# Patient Record
Sex: Male | Born: 1955 | Race: White | Hispanic: No | Marital: Married | Smoking: Former smoker
Health system: Southern US, Community
[De-identification: ages and names within clinical notes are randomized; demographics above are authoritative.]

## PROBLEM LIST (undated history)

## (undated) DIAGNOSIS — N051 Unspecified nephritic syndrome with focal and segmental glomerular lesions: Secondary | ICD-10-CM

## (undated) DIAGNOSIS — I10 Essential (primary) hypertension: Secondary | ICD-10-CM

## (undated) DIAGNOSIS — N185 Chronic kidney disease, stage 5: Secondary | ICD-10-CM

## (undated) DIAGNOSIS — I2510 Atherosclerotic heart disease of native coronary artery without angina pectoris: Secondary | ICD-10-CM

## (undated) DIAGNOSIS — I639 Cerebral infarction, unspecified: Secondary | ICD-10-CM

## (undated) DIAGNOSIS — I129 Hypertensive chronic kidney disease with stage 1 through stage 4 chronic kidney disease, or unspecified chronic kidney disease: Secondary | ICD-10-CM

## (undated) DIAGNOSIS — F32A Depression, unspecified: Secondary | ICD-10-CM

## (undated) DIAGNOSIS — I509 Heart failure, unspecified: Secondary | ICD-10-CM

## (undated) DIAGNOSIS — I255 Ischemic cardiomyopathy: Secondary | ICD-10-CM

## (undated) DIAGNOSIS — I5189 Other ill-defined heart diseases: Secondary | ICD-10-CM

## (undated) DIAGNOSIS — D7581 Myelofibrosis: Secondary | ICD-10-CM

## (undated) DIAGNOSIS — F17200 Nicotine dependence, unspecified, uncomplicated: Secondary | ICD-10-CM

## (undated) DIAGNOSIS — R0600 Dyspnea, unspecified: Secondary | ICD-10-CM

## (undated) DIAGNOSIS — D649 Anemia, unspecified: Secondary | ICD-10-CM

## (undated) DIAGNOSIS — R0609 Other forms of dyspnea: Secondary | ICD-10-CM

## (undated) DIAGNOSIS — D696 Thrombocytopenia, unspecified: Secondary | ICD-10-CM

## (undated) DIAGNOSIS — F329 Major depressive disorder, single episode, unspecified: Secondary | ICD-10-CM

## (undated) DIAGNOSIS — I471 Supraventricular tachycardia, unspecified: Secondary | ICD-10-CM

## (undated) DIAGNOSIS — E785 Hyperlipidemia, unspecified: Secondary | ICD-10-CM

## (undated) DIAGNOSIS — N184 Chronic kidney disease, stage 4 (severe): Secondary | ICD-10-CM

## (undated) DIAGNOSIS — N179 Acute kidney failure, unspecified: Secondary | ICD-10-CM

## (undated) DIAGNOSIS — C801 Malignant (primary) neoplasm, unspecified: Secondary | ICD-10-CM

## (undated) DIAGNOSIS — R5383 Other fatigue: Secondary | ICD-10-CM

## (undated) DIAGNOSIS — I5022 Chronic systolic (congestive) heart failure: Secondary | ICD-10-CM

## (undated) DIAGNOSIS — J449 Chronic obstructive pulmonary disease, unspecified: Secondary | ICD-10-CM

## (undated) DIAGNOSIS — T8859XA Other complications of anesthesia, initial encounter: Secondary | ICD-10-CM

## (undated) DIAGNOSIS — I779 Disorder of arteries and arterioles, unspecified: Secondary | ICD-10-CM

## (undated) HISTORY — PX: EYE SURGERY: SHX253

## (undated) HISTORY — DX: Myelofibrosis: D75.81

## (undated) HISTORY — DX: Depression, unspecified: F32.A

## (undated) HISTORY — DX: Atherosclerotic heart disease of native coronary artery without angina pectoris: I25.10

## (undated) HISTORY — DX: Other ill-defined heart diseases: I51.89

## (undated) HISTORY — DX: Ischemic cardiomyopathy: I25.5

## (undated) HISTORY — DX: Hypertensive chronic kidney disease with stage 1 through stage 4 chronic kidney disease, or unspecified chronic kidney disease: I12.9

## (undated) HISTORY — DX: Cerebral infarction, unspecified: I63.9

## (undated) HISTORY — DX: Essential (primary) hypertension: I10

## (undated) HISTORY — DX: Dyspnea, unspecified: R06.00

## (undated) HISTORY — PX: CORONARY ARTERY BYPASS GRAFT: SHX141

## (undated) HISTORY — DX: Anemia, unspecified: D64.9

## (undated) HISTORY — DX: Hyperlipidemia, unspecified: E78.5

## (undated) HISTORY — PX: CARDIAC CATHETERIZATION: SHX172

## (undated) HISTORY — DX: Chronic systolic (congestive) heart failure: I50.22

## (undated) HISTORY — DX: Unspecified nephritic syndrome with focal and segmental glomerular lesions: N05.1

## (undated) HISTORY — DX: Heart failure, unspecified: I50.9

## (undated) HISTORY — DX: Disorder of arteries and arterioles, unspecified: I77.9

## (undated) HISTORY — PX: EXTERIORIZATION OF A CONTINUOUS AMBULATORY PERITONEAL DIALYSIS CATHETER: SHX6382

## (undated) HISTORY — DX: Other forms of dyspnea: R06.09

## (undated) HISTORY — DX: Thrombocytopenia, unspecified: D69.6

## (undated) HISTORY — DX: Supraventricular tachycardia: I47.1

## (undated) HISTORY — DX: Supraventricular tachycardia, unspecified: I47.10

## (undated) HISTORY — DX: Nicotine dependence, unspecified, uncomplicated: F17.200

## (undated) HISTORY — DX: Major depressive disorder, single episode, unspecified: F32.9

## (undated) HISTORY — DX: Chronic kidney disease, stage 4 (severe): N18.4

## (undated) HISTORY — PX: ANGIOPLASTY: SHX39

## (undated) HISTORY — DX: Acute kidney failure, unspecified: N17.9

---

## 1995-09-23 DIAGNOSIS — I219 Acute myocardial infarction, unspecified: Secondary | ICD-10-CM

## 1995-09-23 HISTORY — DX: Acute myocardial infarction, unspecified: I21.9

## 1995-09-23 HISTORY — PX: CARDIAC CATHETERIZATION: SHX172

## 1996-08-22 DIAGNOSIS — I2510 Atherosclerotic heart disease of native coronary artery without angina pectoris: Secondary | ICD-10-CM

## 1996-08-22 HISTORY — PX: CORONARY STENT PLACEMENT: SHX1402

## 1996-08-22 HISTORY — DX: Atherosclerotic heart disease of native coronary artery without angina pectoris: I25.10

## 2002-10-23 DIAGNOSIS — E785 Hyperlipidemia, unspecified: Secondary | ICD-10-CM

## 2002-10-23 HISTORY — DX: Hyperlipidemia, unspecified: E78.5

## 2008-05-15 ENCOUNTER — Ambulatory Visit: Payer: Self-pay | Admitting: Gastroenterology

## 2008-05-23 LAB — HM COLONOSCOPY: HM COLON: NORMAL

## 2008-09-22 DIAGNOSIS — D631 Anemia in chronic kidney disease: Secondary | ICD-10-CM | POA: Diagnosis present

## 2008-09-22 DIAGNOSIS — I10 Essential (primary) hypertension: Secondary | ICD-10-CM | POA: Diagnosis present

## 2008-09-22 DIAGNOSIS — D7581 Myelofibrosis: Secondary | ICD-10-CM

## 2008-09-22 DIAGNOSIS — I5042 Chronic combined systolic (congestive) and diastolic (congestive) heart failure: Secondary | ICD-10-CM | POA: Diagnosis present

## 2008-09-22 HISTORY — DX: Myelofibrosis: D75.81

## 2009-03-22 ENCOUNTER — Ambulatory Visit: Payer: Self-pay | Admitting: Internal Medicine

## 2009-03-22 HISTORY — PX: BONE MARROW ASPIRATION: SHX1252

## 2009-04-06 ENCOUNTER — Ambulatory Visit: Payer: Self-pay | Admitting: Internal Medicine

## 2009-04-22 ENCOUNTER — Ambulatory Visit: Payer: Self-pay | Admitting: Internal Medicine

## 2009-05-25 ENCOUNTER — Ambulatory Visit: Payer: Self-pay | Admitting: Internal Medicine

## 2009-06-22 ENCOUNTER — Ambulatory Visit: Payer: Self-pay | Admitting: Internal Medicine

## 2009-07-20 ENCOUNTER — Ambulatory Visit: Payer: Self-pay | Admitting: Internal Medicine

## 2009-07-23 ENCOUNTER — Ambulatory Visit: Payer: Self-pay | Admitting: Internal Medicine

## 2009-08-22 ENCOUNTER — Ambulatory Visit: Payer: Self-pay | Admitting: Internal Medicine

## 2009-09-12 ENCOUNTER — Ambulatory Visit: Payer: Self-pay | Admitting: Internal Medicine

## 2009-09-22 ENCOUNTER — Ambulatory Visit: Payer: Self-pay | Admitting: Internal Medicine

## 2009-11-09 ENCOUNTER — Ambulatory Visit: Payer: Self-pay | Admitting: Internal Medicine

## 2009-11-20 ENCOUNTER — Ambulatory Visit: Payer: Self-pay | Admitting: Internal Medicine

## 2009-12-21 ENCOUNTER — Ambulatory Visit: Payer: Self-pay | Admitting: Internal Medicine

## 2009-12-22 ENCOUNTER — Ambulatory Visit: Payer: Self-pay | Admitting: Internal Medicine

## 2010-01-20 ENCOUNTER — Ambulatory Visit: Payer: Self-pay | Admitting: Internal Medicine

## 2010-03-29 ENCOUNTER — Ambulatory Visit: Payer: Self-pay | Admitting: Internal Medicine

## 2010-04-22 ENCOUNTER — Ambulatory Visit: Payer: Self-pay | Admitting: Internal Medicine

## 2010-06-21 ENCOUNTER — Ambulatory Visit: Payer: Self-pay | Admitting: Internal Medicine

## 2010-06-22 ENCOUNTER — Ambulatory Visit: Payer: Self-pay | Admitting: Internal Medicine

## 2010-09-12 ENCOUNTER — Ambulatory Visit: Payer: Self-pay | Admitting: Internal Medicine

## 2010-09-22 ENCOUNTER — Ambulatory Visit: Payer: Self-pay | Admitting: Internal Medicine

## 2010-12-06 ENCOUNTER — Ambulatory Visit: Payer: Self-pay | Admitting: Internal Medicine

## 2010-12-22 ENCOUNTER — Ambulatory Visit: Payer: Self-pay | Admitting: Internal Medicine

## 2011-03-11 ENCOUNTER — Ambulatory Visit: Payer: Self-pay | Admitting: Internal Medicine

## 2011-03-23 ENCOUNTER — Ambulatory Visit: Payer: Self-pay | Admitting: Internal Medicine

## 2011-05-12 ENCOUNTER — Ambulatory Visit: Payer: Self-pay | Admitting: Oncology

## 2011-05-24 ENCOUNTER — Ambulatory Visit: Payer: Self-pay | Admitting: Oncology

## 2011-08-15 ENCOUNTER — Ambulatory Visit: Payer: Self-pay | Admitting: Internal Medicine

## 2011-08-23 ENCOUNTER — Ambulatory Visit: Payer: Self-pay | Admitting: Internal Medicine

## 2011-11-07 ENCOUNTER — Ambulatory Visit: Payer: Self-pay | Admitting: Internal Medicine

## 2011-11-07 LAB — CBC CANCER CENTER
Basophil #: 0 x10 3/mm (ref 0.0–0.1)
Basophil %: 0.8 %
Eosinophil #: 0 x10 3/mm (ref 0.0–0.7)
HGB: 12.1 g/dL — ABNORMAL LOW (ref 13.0–18.0)
Lymphocyte %: 24.6 %
MCH: 28.5 pg (ref 26.0–34.0)
MCHC: 33.9 g/dL (ref 32.0–36.0)
MCV: 84 fL (ref 80–100)
Monocyte #: 0.2 x10 3/mm (ref 0.0–0.7)
Neutrophil %: 68 %
RDW: 19.6 % — ABNORMAL HIGH (ref 11.5–14.5)
WBC: 4.2 x10 3/mm (ref 3.8–10.6)

## 2011-11-21 ENCOUNTER — Ambulatory Visit: Payer: Self-pay | Admitting: Internal Medicine

## 2012-01-30 ENCOUNTER — Ambulatory Visit: Payer: Self-pay | Admitting: Internal Medicine

## 2012-01-30 LAB — CBC CANCER CENTER
Basophil #: 0 x10 3/mm (ref 0.0–0.1)
Eosinophil %: 0.4 %
HCT: 34.2 % — ABNORMAL LOW (ref 40.0–52.0)
HGB: 11.2 g/dL — ABNORMAL LOW (ref 13.0–18.0)
Lymphocyte #: 0.9 x10 3/mm — ABNORMAL LOW (ref 1.0–3.6)
MCH: 27.8 pg (ref 26.0–34.0)
MCHC: 32.8 g/dL (ref 32.0–36.0)
Monocyte #: 0.2 x10 3/mm (ref 0.2–1.0)
Monocyte %: 4 %
Neutrophil #: 3.1 x10 3/mm (ref 1.4–6.5)
Neutrophil %: 73.4 %
RDW: 19.4 % — ABNORMAL HIGH (ref 11.5–14.5)
WBC: 4.2 x10 3/mm (ref 3.8–10.6)

## 2012-02-21 ENCOUNTER — Ambulatory Visit: Payer: Self-pay | Admitting: Internal Medicine

## 2012-04-23 ENCOUNTER — Ambulatory Visit: Payer: Self-pay | Admitting: Internal Medicine

## 2012-04-23 LAB — CBC CANCER CENTER
Basophil #: 0 x10 3/mm (ref 0.0–0.1)
Basophil %: 0.1 %
Eosinophil #: 0 x10 3/mm (ref 0.0–0.7)
HGB: 11.8 g/dL — ABNORMAL LOW (ref 13.0–18.0)
Lymphocyte #: 0.7 x10 3/mm — ABNORMAL LOW (ref 1.0–3.6)
MCH: 29.3 pg (ref 26.0–34.0)
MCV: 87 fL (ref 80–100)
Monocyte #: 0.2 x10 3/mm (ref 0.2–1.0)
Neutrophil #: 2.9 x10 3/mm (ref 1.4–6.5)
RBC: 4.03 10*6/uL — ABNORMAL LOW (ref 4.40–5.90)
RDW: 20.1 % — ABNORMAL HIGH (ref 11.5–14.5)

## 2012-05-23 ENCOUNTER — Ambulatory Visit: Payer: Self-pay | Admitting: Internal Medicine

## 2012-07-09 ENCOUNTER — Ambulatory Visit: Payer: Self-pay | Admitting: Internal Medicine

## 2012-07-09 LAB — FERRITIN: Ferritin (ARMC): 135 ng/mL (ref 8–388)

## 2012-07-09 LAB — CBC CANCER CENTER
Eosinophil #: 0 x10 3/mm (ref 0.0–0.7)
Eosinophil %: 0.5 %
HCT: 37 % — ABNORMAL LOW (ref 40.0–52.0)
HGB: 12 g/dL — ABNORMAL LOW (ref 13.0–18.0)
Lymphocyte %: 23.1 %
MCV: 88 fL (ref 80–100)
Monocyte #: 0.2 x10 3/mm (ref 0.2–1.0)
Platelet: 148 x10 3/mm — ABNORMAL LOW (ref 150–440)
RBC: 4.22 10*6/uL — ABNORMAL LOW (ref 4.40–5.90)
RDW: 19.6 % — ABNORMAL HIGH (ref 11.5–14.5)
WBC: 4.4 x10 3/mm (ref 3.8–10.6)

## 2012-07-09 LAB — IRON AND TIBC
Iron Bind.Cap.(Total): 303 ug/dL
Iron Saturation: 31 %
Iron: 94 ug/dL
Unbound Iron-Bind.Cap.: 209 ug/dL

## 2012-07-23 ENCOUNTER — Ambulatory Visit: Payer: Self-pay | Admitting: Internal Medicine

## 2012-09-22 ENCOUNTER — Ambulatory Visit: Payer: Self-pay | Admitting: Internal Medicine

## 2012-10-22 LAB — CBC CANCER CENTER
Basophil #: 0 x10 3/mm (ref 0.0–0.1)
Basophil %: 0.4 %
Eosinophil %: 0.2 %
Lymphocyte %: 20.8 %
MCH: 28.3 pg (ref 26.0–34.0)
Monocyte #: 0.2 x10 3/mm (ref 0.2–1.0)
Monocyte %: 4.5 %
Neutrophil #: 3.1 x10 3/mm (ref 1.4–6.5)
Neutrophil %: 74.1 %
RBC: 4.13 10*6/uL — ABNORMAL LOW (ref 4.40–5.90)
RDW: 19.6 % — ABNORMAL HIGH (ref 11.5–14.5)
WBC: 4.2 x10 3/mm (ref 3.8–10.6)

## 2012-10-23 ENCOUNTER — Ambulatory Visit: Payer: Self-pay | Admitting: Internal Medicine

## 2012-12-15 ENCOUNTER — Ambulatory Visit: Payer: Self-pay | Admitting: Internal Medicine

## 2012-12-15 LAB — CBC CANCER CENTER
Eosinophil %: 0.5 %
HCT: 34.8 % — ABNORMAL LOW (ref 40.0–52.0)
HGB: 11.6 g/dL — ABNORMAL LOW (ref 13.0–18.0)
Lymphocyte #: 0.9 x10 3/mm — ABNORMAL LOW (ref 1.0–3.6)
Lymphocyte %: 19.3 %
MCH: 28.3 pg (ref 26.0–34.0)
MCV: 85 fL (ref 80–100)
Monocyte #: 0.2 x10 3/mm (ref 0.2–1.0)
Neutrophil #: 3.6 x10 3/mm (ref 1.4–6.5)
Platelet: 156 x10 3/mm (ref 150–440)
RBC: 4.1 10*6/uL — ABNORMAL LOW (ref 4.40–5.90)

## 2012-12-21 ENCOUNTER — Ambulatory Visit: Payer: Self-pay | Admitting: Internal Medicine

## 2013-04-07 ENCOUNTER — Ambulatory Visit: Payer: Self-pay | Admitting: Internal Medicine

## 2013-04-08 LAB — CBC CANCER CENTER
Basophil %: 0.2 %
Eosinophil #: 0 x10 3/mm (ref 0.0–0.7)
Eosinophil %: 0.5 %
HCT: 35.6 % — ABNORMAL LOW (ref 40.0–52.0)
MCH: 29.4 pg (ref 26.0–34.0)
MCHC: 34.8 g/dL (ref 32.0–36.0)
MCV: 85 fL (ref 80–100)
Monocyte #: 0.2 x10 3/mm (ref 0.2–1.0)
Monocyte %: 3.9 %
Neutrophil %: 76.6 %
Platelet: 130 x10 3/mm — ABNORMAL LOW (ref 150–440)

## 2013-04-08 LAB — FERRITIN: Ferritin (ARMC): 109 ng/mL (ref 8–388)

## 2013-04-08 LAB — IRON AND TIBC
Iron Bind.Cap.(Total): 304 ug/dL (ref 250–450)
Unbound Iron-Bind.Cap.: 207 ug/dL

## 2013-04-22 ENCOUNTER — Ambulatory Visit: Payer: Self-pay | Admitting: Internal Medicine

## 2013-09-23 ENCOUNTER — Ambulatory Visit: Payer: Self-pay | Admitting: Internal Medicine

## 2013-09-30 LAB — CBC CANCER CENTER
Basophil #: 0 x10 3/mm (ref 0.0–0.1)
Basophil %: 0.1 %
EOS PCT: 0.5 %
Eosinophil #: 0 x10 3/mm (ref 0.0–0.7)
HCT: 39 % — AB (ref 40.0–52.0)
HGB: 13.1 g/dL (ref 13.0–18.0)
LYMPHS ABS: 0.9 x10 3/mm — AB (ref 1.0–3.6)
LYMPHS PCT: 18 %
MCH: 28.6 pg (ref 26.0–34.0)
MCHC: 33.6 g/dL (ref 32.0–36.0)
MCV: 85 fL (ref 80–100)
MONO ABS: 0.2 x10 3/mm (ref 0.2–1.0)
Monocyte %: 4.4 %
NEUTROS ABS: 4 x10 3/mm (ref 1.4–6.5)
NEUTROS PCT: 77 %
Platelet: 162 x10 3/mm (ref 150–440)
RBC: 4.58 10*6/uL (ref 4.40–5.90)
RDW: 19.6 % — ABNORMAL HIGH (ref 11.5–14.5)
WBC: 5.1 x10 3/mm (ref 3.8–10.6)

## 2013-09-30 LAB — CREATININE, SERUM
Creatinine: 1.12 mg/dL (ref 0.60–1.30)
EGFR (African American): >60
EGFR (Non-African Amer.): >60

## 2013-10-23 ENCOUNTER — Ambulatory Visit: Payer: Self-pay | Admitting: Internal Medicine

## 2013-11-25 ENCOUNTER — Ambulatory Visit: Payer: Self-pay | Admitting: Internal Medicine

## 2013-12-21 ENCOUNTER — Ambulatory Visit: Payer: Self-pay | Admitting: Internal Medicine

## 2014-04-18 ENCOUNTER — Ambulatory Visit: Payer: Self-pay | Admitting: Cardiothoracic Surgery

## 2014-04-20 ENCOUNTER — Ambulatory Visit: Payer: Self-pay | Admitting: Internal Medicine

## 2014-05-11 ENCOUNTER — Ambulatory Visit: Payer: Self-pay | Admitting: Internal Medicine

## 2014-05-12 LAB — CBC CANCER CENTER
BASOS ABS: 0 x10 3/mm (ref 0.0–0.1)
Basophil %: 0.2 %
EOS ABS: 0 x10 3/mm (ref 0.0–0.7)
Eosinophil %: 0.4 %
HCT: 39.7 % — AB (ref 40.0–52.0)
HGB: 12.8 g/dL — ABNORMAL LOW (ref 13.0–18.0)
LYMPHS ABS: 1 x10 3/mm (ref 1.0–3.6)
Lymphocyte %: 19 %
MCH: 28 pg (ref 26.0–34.0)
MCHC: 32.3 g/dL (ref 32.0–36.0)
MCV: 87 fL (ref 80–100)
Monocyte #: 0.2 x10 3/mm (ref 0.2–1.0)
Monocyte %: 4 %
NEUTROS ABS: 4 x10 3/mm (ref 1.4–6.5)
NEUTROS PCT: 76.4 %
PLATELETS: 156 x10 3/mm (ref 150–440)
RBC: 4.58 10*6/uL (ref 4.40–5.90)
RDW: 18.8 % — ABNORMAL HIGH (ref 11.5–14.5)
WBC: 5.3 x10 3/mm (ref 3.8–10.6)

## 2014-05-12 LAB — IRON AND TIBC
Iron Bind.Cap.(Total): 309 ug/dL (ref 250–450)
Iron Saturation: 32 %
Iron: 100 ug/dL (ref 65–175)
UNBOUND IRON-BIND. CAP.: 209 ug/dL

## 2014-05-12 LAB — FERRITIN: Ferritin (ARMC): 138 ng/mL (ref 8–388)

## 2014-05-23 ENCOUNTER — Ambulatory Visit: Payer: Self-pay | Admitting: Internal Medicine

## 2014-07-26 LAB — LIPID PANEL
Cholesterol: 203 mg/dL — AB (ref 0–200)
HDL: 36 mg/dL (ref 35–70)
LDL CALC: 144 mg/dL
TRIGLYCERIDES: 114 mg/dL (ref 40–160)

## 2014-07-26 LAB — PSA: PSA: 0.3

## 2014-07-26 LAB — TSH: TSH: 1.76 u[IU]/mL (ref 0.41–5.90)

## 2014-10-27 ENCOUNTER — Ambulatory Visit: Payer: Self-pay | Admitting: Internal Medicine

## 2014-11-08 DIAGNOSIS — D471 Chronic myeloproliferative disease: Secondary | ICD-10-CM | POA: Diagnosis not present

## 2014-11-08 DIAGNOSIS — Z7982 Long term (current) use of aspirin: Secondary | ICD-10-CM | POA: Diagnosis not present

## 2014-11-08 DIAGNOSIS — M25511 Pain in right shoulder: Secondary | ICD-10-CM | POA: Diagnosis not present

## 2014-11-08 DIAGNOSIS — Z79899 Other long term (current) drug therapy: Secondary | ICD-10-CM | POA: Diagnosis not present

## 2014-11-08 DIAGNOSIS — D649 Anemia, unspecified: Secondary | ICD-10-CM | POA: Diagnosis not present

## 2014-11-08 DIAGNOSIS — M129 Arthropathy, unspecified: Secondary | ICD-10-CM | POA: Diagnosis not present

## 2014-11-08 DIAGNOSIS — R944 Abnormal results of kidney function studies: Secondary | ICD-10-CM | POA: Diagnosis not present

## 2014-11-08 DIAGNOSIS — R5383 Other fatigue: Secondary | ICD-10-CM | POA: Diagnosis not present

## 2014-11-08 DIAGNOSIS — R918 Other nonspecific abnormal finding of lung field: Secondary | ICD-10-CM | POA: Diagnosis not present

## 2014-11-14 DIAGNOSIS — I129 Hypertensive chronic kidney disease with stage 1 through stage 4 chronic kidney disease, or unspecified chronic kidney disease: Secondary | ICD-10-CM | POA: Diagnosis not present

## 2014-11-14 DIAGNOSIS — N182 Chronic kidney disease, stage 2 (mild): Secondary | ICD-10-CM | POA: Diagnosis not present

## 2014-11-21 ENCOUNTER — Ambulatory Visit
Admit: 2014-11-21 | Disposition: A | Discharge status: Home / Self Care | Payer: Self-pay | Attending: Internal Medicine | Admitting: Internal Medicine

## 2015-01-09 DIAGNOSIS — I10 Essential (primary) hypertension: Secondary | ICD-10-CM | POA: Insufficient documentation

## 2015-01-09 DIAGNOSIS — F339 Major depressive disorder, recurrent, unspecified: Secondary | ICD-10-CM | POA: Insufficient documentation

## 2015-01-09 DIAGNOSIS — N183 Chronic kidney disease, stage 3 (moderate): Secondary | ICD-10-CM

## 2015-01-09 DIAGNOSIS — N184 Chronic kidney disease, stage 4 (severe): Secondary | ICD-10-CM | POA: Insufficient documentation

## 2015-01-09 DIAGNOSIS — N185 Chronic kidney disease, stage 5: Secondary | ICD-10-CM | POA: Insufficient documentation

## 2015-01-09 DIAGNOSIS — D471 Chronic myeloproliferative disease: Secondary | ICD-10-CM | POA: Insufficient documentation

## 2015-01-09 DIAGNOSIS — F329 Major depressive disorder, single episode, unspecified: Secondary | ICD-10-CM | POA: Insufficient documentation

## 2015-01-09 DIAGNOSIS — D7581 Myelofibrosis: Secondary | ICD-10-CM | POA: Insufficient documentation

## 2015-01-09 DIAGNOSIS — I129 Hypertensive chronic kidney disease with stage 1 through stage 4 chronic kidney disease, or unspecified chronic kidney disease: Secondary | ICD-10-CM | POA: Insufficient documentation

## 2015-02-23 ENCOUNTER — Telehealth: Payer: Self-pay

## 2015-02-23 MED ORDER — CITALOPRAM HYDROBROMIDE 20 MG PO TABS: 20.0000 mg | ORAL_TABLET | Freq: Every day | ORAL | Status: DC

## 2015-02-23 NOTE — Telephone Encounter (Signed)
Refilled

## 2015-02-23 NOTE — Telephone Encounter (Signed)
Patients wife called requesting a refill on the Citalopram 20mg  QD, she states that he will be out this weekend. They use [address].

## 2015-02-26 ENCOUNTER — Telehealth: Payer: Self-pay | Admitting: Family Medicine

## 2015-02-26 NOTE — Telephone Encounter (Signed)
Pt's wife called stated she called last week about a refill on pt's Citalopram. Pt is completely out of this medication. Pharm is [facility] on [address]. Thanks.

## 2015-02-26 NOTE — Telephone Encounter (Signed)
Called, spoke with the pharmacy. Patient had already picked the prescription up.

## 2015-04-26 ENCOUNTER — Other Ambulatory Visit: Payer: Self-pay | Admitting: Family Medicine

## 2015-04-26 DIAGNOSIS — F329 Major depressive disorder, single episode, unspecified: Secondary | ICD-10-CM

## 2015-04-26 DIAGNOSIS — F32A Depression, unspecified: Secondary | ICD-10-CM

## 2015-04-26 NOTE — Telephone Encounter (Signed)
Needs an appointment. Will get him enough medicine to make it to appointment when it's booked.   

## 2015-04-26 NOTE — Telephone Encounter (Signed)
Appointment scheduled, medication sent to pharmacy.

## 2015-05-07 ENCOUNTER — Encounter: Payer: Self-pay | Admitting: Family Medicine

## 2015-05-07 ENCOUNTER — Ambulatory Visit (INDEPENDENT_AMBULATORY_CARE_PROVIDER_SITE_OTHER): Payer: Commercial Managed Care - HMO | Admitting: Family Medicine

## 2015-05-07 VITALS — BP 148/80 | HR 75 | Temp 98.1°F | Wt 184.6 lb

## 2015-05-07 DIAGNOSIS — I129 Hypertensive chronic kidney disease with stage 1 through stage 4 chronic kidney disease, or unspecified chronic kidney disease: Secondary | ICD-10-CM

## 2015-05-07 DIAGNOSIS — F329 Major depressive disorder, single episode, unspecified: Secondary | ICD-10-CM | POA: Diagnosis not present

## 2015-05-07 DIAGNOSIS — N182 Chronic kidney disease, stage 2 (mild): Secondary | ICD-10-CM | POA: Diagnosis not present

## 2015-05-07 DIAGNOSIS — F32A Depression, unspecified: Secondary | ICD-10-CM

## 2015-05-07 DIAGNOSIS — I10 Essential (primary) hypertension: Secondary | ICD-10-CM

## 2015-05-07 LAB — MICROALBUMIN, URINE WAIVED
CREATININE, URINE WAIVED: 50 mg/dL (ref 10–300)
MICROALB, UR WAIVED: 150 mg/L — AB (ref 0–19)

## 2015-05-07 MED ORDER — CITALOPRAM HYDROBROMIDE 20 MG PO TABS: 20.0000 mg | ORAL_TABLET | Freq: Every day | ORAL | Status: DC

## 2015-05-07 MED ORDER — LISINOPRIL 2.5 MG PO TABS: 2.5000 mg | ORAL_TABLET | Freq: Every day | ORAL | Status: DC

## 2015-05-07 NOTE — Assessment & Plan Note (Signed)
Well controlled on current regimen. Continue current regimen. Continue to monitor.

## 2015-05-07 NOTE — Patient Instructions (Signed)
Hypertension Hypertension is another name for high blood pressure. High blood pressure forces your heart to work harder to pump blood. A blood pressure reading has two numbers, which includes a higher number over a lower number (example: 110/72). HOME CARE   Have your blood pressure rechecked by your doctor.  Only take medicine as told by your doctor. Follow the directions carefully. The medicine does not work as well if you skip doses. Skipping doses also puts you at risk for problems.  Do not smoke.  Monitor your blood pressure at home as told by your doctor. GET HELP IF:  You think you are having a reaction to the medicine you are taking.  You have repeat headaches or feel dizzy.  You have puffiness (swelling) in your ankles.  You have trouble with your vision. GET HELP RIGHT AWAY IF:   You get a very bad headache and are confused.  You feel weak, numb, or faint.  You get chest or belly (abdominal) pain.  You throw up (vomit).  You cannot breathe very well. MAKE SURE YOU:   Understand these instructions.  Will watch your condition.  Will get help right away if you are not doing well or get worse. Document Released: 02/25/2008 Document Revised: 09/13/2013 Document Reviewed: 07/01/2013 [facility] Patient Information 2015 [place], [place]. This information is not intended to replace advice given to you by your health care provider. Make sure you discuss any questions you have with your health care provider. Chronic Kidney Disease Chronic kidney disease occurs when the kidneys are damaged over a long period. The kidneys are two organs that lie on either side of the spine between the middle of the back and the front of the abdomen. The kidneys:   Remove wastes and extra water from the blood.   Produce important hormones. These help keep bones strong, regulate blood pressure, and help create red blood cells.   Balance the fluids and chemicals in the blood and tissues. A  small amount of kidney damage may not cause problems, but a large amount of damage may make it difficult or impossible for the kidneys to work the way they should. If steps are not taken to slow down the kidney damage or stop it from getting worse, the kidneys may stop working permanently. Most of the time, chronic kidney disease does not go away. However, it can often be controlled, and those with the disease can usually live normal lives. CAUSES  The most common causes of chronic kidney disease are diabetes and high blood pressure (hypertension). Chronic kidney disease may also be caused by:   Diseases that cause the kidneys' filters to become inflamed.   Diseases that affect the immune system.   Genetic diseases.   Medicines that damage the kidneys, such as anti-inflammatory medicines.  Poisoning or exposure to toxic substances.   A reoccurring kidney or urinary infection.   A problem with urine flow. This may be caused by:   Cancer.   Kidney stones.   An enlarged prostate in males. SIGNS AND SYMPTOMS  Because the kidney damage in chronic kidney disease occurs slowly, symptoms develop slowly and may not be obvious until the kidney damage becomes severe. A person may have a kidney disease for years without showing any symptoms. Symptoms can include:   Swelling (edema) of the legs, ankles, or feet.   Tiredness (lethargy).   Nausea or vomiting.   Confusion.   Problems with urination, such as:   Decreased urine production.  Frequent urination, especially at night.   Frequent accidents in children who are potty trained.   Muscle twitches and cramps.   Shortness of breath.  Weakness.   Persistent itchiness.   Loss of appetite.  Metallic taste in the mouth.  Trouble sleeping.  Slowed development in children.  Short stature in children. DIAGNOSIS  Chronic kidney disease may be detected and diagnosed by tests, including blood, urine,  imaging, or kidney biopsy tests.  TREATMENT  Most chronic kidney diseases cannot be cured. Treatment usually involves relieving symptoms and preventing or slowing the progression of the disease. Treatment may include:   A special diet. You may need to avoid alcohol and foods thatare salty and high in potassium.   Medicines. These may:   Lower blood pressure.   Relieve anemia.   Relieve swelling.   Protect the bones. HOME CARE INSTRUCTIONS   Follow your prescribed diet.   Take medicines only as directed by your health care provider. Do not take any new medicines (prescription, over-the-counter, or nutritional supplements) unless approved by your health care provider. Many medicines can worsen your kidney damage or need to have the dose adjusted.   Quit smoking if you smoke. Talk to your health care provider about a smoking cessation program.   Keep all follow-up visits as directed by your health care provider. SEEK IMMEDIATE MEDICAL CARE IF:  Your symptoms get worse or you develop new symptoms.   You develop symptoms of end-stage kidney disease. These include:   Headaches.   Abnormally dark or light skin.   Numbness in the hands or feet.   Easy bruising.   Frequent hiccups.   Menstruation stops.   You have a fever.   You have decreased urine production.   You havepain or bleeding when urinating. MAKE SURE YOU:  Understand these instructions.  Will watch your condition.  Will get help right away if you are not doing well or get worse. FOR MORE INFORMATION   American Association of Kidney Patients: BombTimer.gl  National Kidney Foundation: www.kidney.[place]: https://mathis.com/  [facility]: www.lifeoptions.org and www.kidneyschool.org Document Released: 06/17/2008 Document Revised: 01/23/2014 Document Reviewed: 05/07/2012 [facility] Patient Information 2015 [place], [place]. This information is not  intended to replace advice given to you by your health care provider. Make sure you discuss any questions you have with your health care provider.

## 2015-05-07 NOTE — Progress Notes (Signed)
BP 148/80 mmHg  Pulse 75  Temp(Src) 98.1 F (36.7 C)  Wt 184 lb 9.6 oz (83.734 kg)  SpO2 97%   Subjective:    [name], male    DOB: [birth year], 59 y.o.   MRN: [id number]  HPI: James Arnold is a 59 y.o. male  Chief Complaint  Patient presents with  . Depression   DEPRESSION Mood status: controlled Satisfied with current treatment?: yes Symptom severity: mild  Duration of current treatment : chronic Side effects: no Medication compliance: excellent compliance Psychotherapy/counseling: no  Depressed mood: no Anxious mood: no Anhedonia: no Significant weight loss or gain: no Insomnia: no  Fatigue: no Feelings of worthlessness or guilt: no Impaired concentration/indecisiveness: no Suicidal ideations: no Hopelessness: no Crying spells: no Depression screen PHQ 2/9 05/07/2015  Decreased Interest 0  Down, Depressed, Hopeless 0  PHQ - 2 Score 0    GAD7: 0  HYPERTENSION Hypertension status: uncontrolled  Satisfied with current treatment? yes Duration of hypertension: chronic BP monitoring frequency:  not checking Previous BP meds:atenolol Aspirin: yes Recurrent headaches: no Visual changes: no Palpitations: no Dyspnea: no Chest pain: no Lower extremity edema: no Dizzy/lightheaded: no   Relevant past medical, surgical, family and social history reviewed and updated as indicated. Interim medical history since our last visit reviewed. Allergies and medications reviewed and updated.  Review of Systems  Constitutional: Negative.   Respiratory: Negative.   Cardiovascular: Negative.   Musculoskeletal: Negative.   Psychiatric/Behavioral: Negative.     Per HPI unless specifically indicated above     Objective:    BP 148/80 mmHg  Pulse 75  Temp(Src) 98.1 F (36.7 C)  Wt 184 lb 9.6 oz (83.734 kg)  SpO2 97%  Wt Readings from Last 3 Encounters:  05/07/15 184 lb 9.6 oz (83.734 kg)  01/09/15 191 lb (86.637 kg)    Physical Exam   Constitutional: He is oriented to person, place, and time. He appears well-developed and well-nourished. No distress.  HENT:  Head: Normocephalic and atraumatic.  Right Ear: Hearing normal.  Left Ear: Hearing normal.  Nose: Nose normal.  Eyes: Conjunctivae and lids are normal. Right eye exhibits no discharge. Left eye exhibits no discharge. No scleral icterus.  Cardiovascular: Normal rate, regular rhythm and normal heart sounds.  Exam reveals no gallop and no friction rub.   No murmur heard. Pulmonary/Chest: Effort normal and breath sounds normal. No respiratory distress. He has no wheezes. He has no rales. He exhibits no tenderness.  Musculoskeletal: Normal range of motion.  Neurological: He is alert and oriented to person, place, and time.  Skin: Skin is warm, dry and intact. No rash noted. No erythema. No pallor.  Psychiatric: He has a normal mood and affect. His speech is normal and behavior is normal. Judgment and thought content normal. Cognition and memory are normal.  Nursing note and vitals reviewed.     Assessment & Plan:   Problem List Items Addressed This Visit      Cardiovascular and Mediastinum   Hypertension   Relevant Medications   lisinopril (PRINIVIL,ZESTRIL) 2.5 MG tablet   Other Relevant Orders   Basic metabolic panel   Microalbumin, Urine Waived     Genitourinary   Benign hypertensive renal disease - Primary    Discussed importance of protecting his kidneys despite labile BP. Will start him on low dose lisinopril. Discussed risks and benefits. Will have him return in 1 month for recheck on BP and repeat BMP.  Relevant Orders   Basic metabolic panel   Chronic kidney disease, stage II (mild)   Relevant Orders   Basic metabolic panel   Microalbumin, Urine Waived     Other   Depression    Well controlled on current regimen. Continue current regimen. Continue to monitor.       Relevant Medications   citalopram (CELEXA) 20 MG tablet       Follow  up plan: Return in about 4 weeks (around 06/04/2015).

## 2015-05-07 NOTE — Assessment & Plan Note (Signed)
Discussed importance of protecting his kidneys despite labile BP. Will start him on low dose lisinopril. Discussed risks and benefits. Will have him return in 1 month for recheck on BP and repeat BMP.

## 2015-05-08 ENCOUNTER — Encounter: Payer: Self-pay | Admitting: Family Medicine

## 2015-05-08 LAB — BASIC METABOLIC PANEL
BUN/Creatinine Ratio: 12 (ref 9–20)
BUN: 14 mg/dL (ref 6–24)
CALCIUM: 8.6 mg/dL — AB (ref 8.7–10.2)
CO2: 22 mmol/L (ref 18–29)
Chloride: 102 mmol/L (ref 97–108)
Creatinine, Ser: 1.19 mg/dL (ref 0.76–1.27)
GFR calc Af Amer: 77 mL/min/{1.73_m2} (ref >59)
GFR, EST NON AFRICAN AMERICAN: 66 mL/min/{1.73_m2} (ref >59)
GLUCOSE: 102 mg/dL — AB (ref 65–99)
POTASSIUM: 4.4 mmol/L (ref 3.5–5.2)
Sodium: 137 mmol/L (ref 134–144)

## 2015-06-05 ENCOUNTER — Ambulatory Visit: Payer: Medicare HMO | Admitting: Family Medicine

## 2015-08-13 ENCOUNTER — Encounter: Payer: Self-pay | Admitting: Family Medicine

## 2015-10-25 ENCOUNTER — Other Ambulatory Visit: Payer: Self-pay | Admitting: Family Medicine

## 2015-11-15 ENCOUNTER — Encounter: Payer: Self-pay | Admitting: *Deleted

## 2015-11-15 ENCOUNTER — Other Ambulatory Visit: Payer: Self-pay | Admitting: *Deleted

## 2015-11-15 DIAGNOSIS — D7581 Myelofibrosis: Secondary | ICD-10-CM

## 2015-11-15 DIAGNOSIS — N182 Chronic kidney disease, stage 2 (mild): Secondary | ICD-10-CM

## 2015-11-16 ENCOUNTER — Inpatient Hospital Stay: Payer: Commercial Managed Care - HMO | Attending: Internal Medicine

## 2015-11-16 ENCOUNTER — Inpatient Hospital Stay (HOSPITAL_BASED_OUTPATIENT_CLINIC_OR_DEPARTMENT_OTHER): Payer: Commercial Managed Care - HMO | Admitting: Internal Medicine

## 2015-11-16 VITALS — BP 155/88 | HR 79 | Temp 98.5°F | Resp 16 | Wt 182.4 lb

## 2015-11-16 DIAGNOSIS — F329 Major depressive disorder, single episode, unspecified: Secondary | ICD-10-CM | POA: Insufficient documentation

## 2015-11-16 DIAGNOSIS — Z808 Family history of malignant neoplasm of other organs or systems: Secondary | ICD-10-CM | POA: Insufficient documentation

## 2015-11-16 DIAGNOSIS — D649 Anemia, unspecified: Secondary | ICD-10-CM | POA: Diagnosis not present

## 2015-11-16 DIAGNOSIS — F32A Depression, unspecified: Secondary | ICD-10-CM

## 2015-11-16 DIAGNOSIS — N182 Chronic kidney disease, stage 2 (mild): Secondary | ICD-10-CM

## 2015-11-16 DIAGNOSIS — Z801 Family history of malignant neoplasm of trachea, bronchus and lung: Secondary | ICD-10-CM

## 2015-11-16 DIAGNOSIS — D7581 Myelofibrosis: Secondary | ICD-10-CM

## 2015-11-16 DIAGNOSIS — I129 Hypertensive chronic kidney disease with stage 1 through stage 4 chronic kidney disease, or unspecified chronic kidney disease: Secondary | ICD-10-CM

## 2015-11-16 DIAGNOSIS — I2510 Atherosclerotic heart disease of native coronary artery without angina pectoris: Secondary | ICD-10-CM

## 2015-11-16 DIAGNOSIS — Z800 Family history of malignant neoplasm of digestive organs: Secondary | ICD-10-CM | POA: Diagnosis not present

## 2015-11-16 DIAGNOSIS — E785 Hyperlipidemia, unspecified: Secondary | ICD-10-CM | POA: Insufficient documentation

## 2015-11-16 DIAGNOSIS — Z87891 Personal history of nicotine dependence: Secondary | ICD-10-CM | POA: Diagnosis not present

## 2015-11-16 LAB — CBC WITH DIFFERENTIAL/PLATELET
BASOS ABS: 0 10*3/uL (ref 0–0.1)
Eosinophils Absolute: 0 10*3/uL (ref 0–0.7)
Eosinophils Relative: 0 %
HCT: 36.5 % — ABNORMAL LOW (ref 40.0–52.0)
Hemoglobin: 12.3 g/dL — ABNORMAL LOW (ref 13.0–18.0)
LYMPHS ABS: 0.8 10*3/uL — AB (ref 1.0–3.6)
MCH: 28.1 pg (ref 26.0–34.0)
MCHC: 33.6 g/dL (ref 32.0–36.0)
MCV: 83.5 fL (ref 80.0–100.0)
Monocytes Absolute: 0.1 10*3/uL — ABNORMAL LOW (ref 0.2–1.0)
Neutro Abs: 3.6 10*3/uL (ref 1.4–6.5)
PLATELETS: 168 10*3/uL (ref 150–440)
RBC: 4.37 MIL/uL — ABNORMAL LOW (ref 4.40–5.90)
RDW: 19.3 % — AB (ref 11.5–14.5)
WBC: 4.5 10*3/uL (ref 3.8–10.6)

## 2015-11-16 LAB — COMPREHENSIVE METABOLIC PANEL
ALBUMIN: 3.8 g/dL (ref 3.5–5.0)
ALT: 14 U/L — AB (ref 17–63)
AST: 19 U/L (ref 15–41)
Alkaline Phosphatase: 65 U/L (ref 38–126)
Anion gap: 6 (ref 5–15)
BUN: 26 mg/dL — ABNORMAL HIGH (ref 6–20)
CHLORIDE: 104 mmol/L (ref 101–111)
CO2: 23 mmol/L (ref 22–32)
CREATININE: 1.5 mg/dL — AB (ref 0.61–1.24)
Calcium: 8.7 mg/dL — ABNORMAL LOW (ref 8.9–10.3)
GFR calc non Af Amer: 49 mL/min — ABNORMAL LOW (ref >60)
GFR, EST AFRICAN AMERICAN: 57 mL/min — AB (ref >60)
GLUCOSE: 153 mg/dL — AB (ref 65–99)
Potassium: 4.2 mmol/L (ref 3.5–5.1)
SODIUM: 133 mmol/L — AB (ref 135–145)
Total Bilirubin: 0.6 mg/dL (ref 0.3–1.2)
Total Protein: 6.8 g/dL (ref 6.5–8.1)

## 2015-11-16 LAB — LACTATE DEHYDROGENASE: LDH: 690 U/L — ABNORMAL HIGH (ref 98–192)

## 2015-11-16 MED ORDER — CITALOPRAM HYDROBROMIDE 20 MG PO TABS: 20.0000 mg | ORAL_TABLET | Freq: Every day | ORAL | Status: DC

## 2015-11-16 NOTE — Progress Notes (Signed)
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   INTERVAL HISTORY:  This is my first interaction with the patient since I joined the practice September 2016. I reviewed the patient's prior charts/pertinent labs/imaging in detail; findings are summarized above.   A very pleasant 60 year old male patient with above history of primary myelofibrosis diagnosed in 2010 is here for follow-up.   Patient denies any weight loss./ weight is stable around 185 pounds.  Denies any night sweats or  Loss of appetite or early satiety.  Denies any lumps or bumps. No fevers.  REVIEW OF SYSTEMS:  A complete 10 point review of system is done which is negative except mentioned above/history of present illness.   PAST MEDICAL HISTORY :  Past Medical History  Diagnosis Date  . CAD (coronary artery disease) 08/1996    Stent placed  . Myelofibrosis ([place]) 2010  . Hyperlipidemia 10/2002  . Hypertension   . Depression   . Benign hypertensive renal disease   . Chronic kidney disease, stage II (mild)   . Thrombocytopenia ([place])   . Smoker   . Anemia     PAST SURGICAL HISTORY :   Past Surgical History  Procedure Laterality Date  . Coronary stent placement  08/1996  . Angioplasty    . Bone marrow aspiration  03/2009    FAMILY HISTORY :   Family History  Problem Relation Age of Onset  . Stroke Mother     Low BP stroke  . Depression Father   . Depression Sister     Breast  . Heart disease    . Breast cancer    . Lung cancer    . Ovarian cancer    . Stomach cancer      SOCIAL HISTORY:   Social History  Substance Use Topics  . Smoking status: Former Smoker -- 1.00 packs/day    Types: Cigarettes    Quit date:  09/22/2013  . Smokeless tobacco: Never Used  . Alcohol Use: 0.0 oz/week    0 Standard drinks or equivalent per week     Comment: 4x a week and 12 pack beer on weekends only    ALLERGIES:  has No Known Allergies.  MEDICATIONS:  Current Outpatient Prescriptions  Medication Sig Dispense Refill  . aspirin 325 MG tablet Take 325 mg by mouth daily.    . citalopram (CELEXA) 20 MG tablet Take 1 tablet (20 mg total) by mouth daily. 30 tablet 6   No current facility-administered medications for this visit.    PHYSICAL EXAMINATION: ECOG PERFORMANCE STATUS: 0 - Asymptomatic  BP 155/88 mmHg  Pulse 79  Temp(Src) 98.5 F (36.9 C) (Tympanic)  Resp 16  Wt 182 lb 6.9 oz (82.75 kg)  Filed Weights   11/16/15 1044  Weight: 182 lb 6.9 oz (82.75 kg)    GENERAL: Well-nourished well-developed; Alert, no distress and comfortable.    Accompanied by his wife. EYES: no pallor or icterus OROPHARYNX: no thrush or ulceration; good dentition  NECK: supple, no masses felt LYMPH:  no palpable lymphadenopathy in the cervical, axillary or inguinal regions LUNGS: clear to auscultation and  No wheeze or crackles HEART/CVS: regular rate & rhythm and no murmurs; No lower extremity edema;  Positive for splenomegaly.  ABDOMEN:abdomen soft, non-tender and normal bowel sounds Musculoskeletal:no cyanosis of digits and no clubbing  PSYCH: alert & oriented x 3 with fluent speech NEURO: no focal motor/sensory deficits SKIN:  no rashes or significant lesions  LABORATORY DATA:  I have reviewed the data as listed    Component Value Date/Time   NA 133* 11/16/2015 1026   NA 137 05/07/2015 0822   K 4.2 11/16/2015 1026   CL 104 11/16/2015 1026   CO2 23 11/16/2015 1026   GLUCOSE 153* 11/16/2015 1026   GLUCOSE 102* 05/07/2015 0822   BUN 26* 11/16/2015 1026   BUN 14 05/07/2015 0822   CREATININE 1.50* 11/16/2015 1026   CREATININE 1.12 09/30/2013 0953   CALCIUM 8.7* 11/16/2015 1026   PROT 6.8 11/16/2015 1026    ALBUMIN 3.8 11/16/2015 1026   AST 19 11/16/2015 1026   ALT 14* 11/16/2015 1026   ALKPHOS 65 11/16/2015 1026   BILITOT 0.6 11/16/2015 1026   GFRNONAA 49* 11/16/2015 1026   GFRNONAA >60 09/30/2013 0953   GFRAA 57* 11/16/2015 1026   GFRAA >60 09/30/2013 0953    No results found for: SPEP, UPEP  Lab Results  Component Value Date   WBC 4.5 11/16/2015   NEUTROABS 3.6 11/16/2015   HGB 12.3* 11/16/2015   HCT 36.5* 11/16/2015   MCV 83.5 11/16/2015   PLT 168 11/16/2015      Chemistry      Component Value Date/Time   NA 133* 11/16/2015 1026   NA 137 05/07/2015 0822   K 4.2 11/16/2015 1026   CL 104 11/16/2015 1026   CO2 23 11/16/2015 1026   BUN 26* 11/16/2015 1026   BUN 14 05/07/2015 0822   CREATININE 1.50* 11/16/2015 1026   CREATININE 1.12 09/30/2013 0953      Component Value Date/Time   CALCIUM 8.7* 11/16/2015 1026   ALKPHOS 65 11/16/2015 1026   AST 19 11/16/2015 1026   ALT 14* 11/16/2015 1026   BILITOT 0.6 11/16/2015 1026       ASSESSMENT & PLAN:   # PrimaryMyelofiborisis [Bone marrow 2010] jak-2 positive;  Low risk.  Currently on surveillance.  Patient's CBC today is within normal limits except for mild anemia of hemoglobin 12. Check LDH today. Patient is asymptomatic.   #  Discussed the natural history of primary myelofibrosis that is in generally its incurable;  And the only cure is a stem cell bone marrow transplant.   Also discussed the option of treating with  Jakafi.  However I stressed the patient and his wife that treatment would only be recommended if patient gets symptomatic-  Night sweats weight loss  Or extreme fatigue/ Onset of anemia etc.  I also discussed that stem cell bone marrow transplant  Could be offered to the patient  As he is fairly young.  For now recommend surveillance.  #  Creatinine is slightly elevated at 1.5/ recommending  Increased fluid intake.  Question related to blood pressure/ follow up with PCP recommended.   #  Depression stable-   Given a new prescription for  Celexa.  #  I will recommend follow-up/ labs CBC CMP and LDHevery 6 months;  I will recommend ultrasound of the abdomen quantify splenomegaly.  This could be done before his next visit/ In 6 month.  Patient was given a copy of his CBC/ CMP.   # 25 minutes face-to-face with the patient discussing the above plan of care; more than 50% of time spent on prognosis/ natural history; counseling and coordination.  Cammie Sickle, MD 11/16/2015 11:18 AM

## 2015-11-16 NOTE — Progress Notes (Signed)
Per md order, added ldh to patient's labs today. RN Spoke with Carnegie in lab.

## 2015-11-16 NOTE — Progress Notes (Signed)
Patient does not offer any problems today. Is requesting a refill on Citalopram that was being filled by Dr. Ma Hillock.

## 2016-01-15 DIAGNOSIS — Z0100 Encounter for examination of eyes and vision without abnormal findings: Secondary | ICD-10-CM | POA: Diagnosis not present

## 2016-05-09 ENCOUNTER — Telehealth: Payer: Self-pay

## 2016-05-09 DIAGNOSIS — D7581 Myelofibrosis: Secondary | ICD-10-CM

## 2016-05-09 NOTE — Telephone Encounter (Signed)
[facility] called they need a [facility] referral for this patient he is scheduled to see Dr.Brahmanday on May 16, 2016.

## 2016-05-09 NOTE — Telephone Encounter (Signed)
Referral put in.

## 2016-05-13 ENCOUNTER — Telehealth: Payer: Self-pay | Admitting: Internal Medicine

## 2016-05-13 ENCOUNTER — Other Ambulatory Visit: Payer: Self-pay | Admitting: *Deleted

## 2016-05-13 DIAGNOSIS — D7581 Myelofibrosis: Secondary | ICD-10-CM

## 2016-05-13 NOTE — Telephone Encounter (Signed)
Spoke with teresa. md would like to order [place] abd limited. I asked Helene Kelp to unlink the [place] abd complete so that md can cnl this order.

## 2016-05-13 NOTE — Telephone Encounter (Signed)
James Arnold called and asked you to call department to inform whether Dr. B wants a complete US tomorrow (as order states) or if they are just looking at spleen again like last time. Please call: [phone]. Thanks!

## 2016-05-14 ENCOUNTER — Ambulatory Visit
Admission: RE | Admit: 2016-05-14 | Discharge: 2016-05-14 | Disposition: A | Discharge status: Home / Self Care | Payer: Commercial Managed Care - HMO | Source: Ambulatory Visit | Attending: Internal Medicine | Admitting: Internal Medicine

## 2016-05-14 DIAGNOSIS — R161 Splenomegaly, not elsewhere classified: Secondary | ICD-10-CM | POA: Diagnosis not present

## 2016-05-14 DIAGNOSIS — D7581 Myelofibrosis: Secondary | ICD-10-CM | POA: Insufficient documentation

## 2016-05-16 ENCOUNTER — Encounter: Payer: Self-pay | Admitting: Internal Medicine

## 2016-05-16 ENCOUNTER — Other Ambulatory Visit: Payer: Self-pay | Admitting: Family Medicine

## 2016-05-16 ENCOUNTER — Inpatient Hospital Stay (HOSPITAL_BASED_OUTPATIENT_CLINIC_OR_DEPARTMENT_OTHER): Payer: Commercial Managed Care - HMO | Admitting: Internal Medicine

## 2016-05-16 ENCOUNTER — Inpatient Hospital Stay: Payer: Commercial Managed Care - HMO | Attending: Internal Medicine

## 2016-05-16 VITALS — BP 170/90 | HR 74 | Temp 97.2°F | Resp 18 | Ht 66.5 in | Wt 175.6 lb

## 2016-05-16 DIAGNOSIS — N182 Chronic kidney disease, stage 2 (mild): Secondary | ICD-10-CM | POA: Insufficient documentation

## 2016-05-16 DIAGNOSIS — D649 Anemia, unspecified: Secondary | ICD-10-CM | POA: Insufficient documentation

## 2016-05-16 DIAGNOSIS — Z87891 Personal history of nicotine dependence: Secondary | ICD-10-CM | POA: Diagnosis not present

## 2016-05-16 DIAGNOSIS — Z800 Family history of malignant neoplasm of digestive organs: Secondary | ICD-10-CM | POA: Insufficient documentation

## 2016-05-16 DIAGNOSIS — E785 Hyperlipidemia, unspecified: Secondary | ICD-10-CM | POA: Diagnosis not present

## 2016-05-16 DIAGNOSIS — D696 Thrombocytopenia, unspecified: Secondary | ICD-10-CM | POA: Insufficient documentation

## 2016-05-16 DIAGNOSIS — R944 Abnormal results of kidney function studies: Secondary | ICD-10-CM | POA: Diagnosis not present

## 2016-05-16 DIAGNOSIS — Z8041 Family history of malignant neoplasm of ovary: Secondary | ICD-10-CM | POA: Insufficient documentation

## 2016-05-16 DIAGNOSIS — I129 Hypertensive chronic kidney disease with stage 1 through stage 4 chronic kidney disease, or unspecified chronic kidney disease: Secondary | ICD-10-CM | POA: Insufficient documentation

## 2016-05-16 DIAGNOSIS — Z803 Family history of malignant neoplasm of breast: Secondary | ICD-10-CM | POA: Diagnosis not present

## 2016-05-16 DIAGNOSIS — F329 Major depressive disorder, single episode, unspecified: Secondary | ICD-10-CM | POA: Insufficient documentation

## 2016-05-16 DIAGNOSIS — D471 Chronic myeloproliferative disease: Secondary | ICD-10-CM | POA: Diagnosis not present

## 2016-05-16 DIAGNOSIS — Z79899 Other long term (current) drug therapy: Secondary | ICD-10-CM | POA: Insufficient documentation

## 2016-05-16 DIAGNOSIS — F32A Depression, unspecified: Secondary | ICD-10-CM

## 2016-05-16 DIAGNOSIS — Z7982 Long term (current) use of aspirin: Secondary | ICD-10-CM | POA: Insufficient documentation

## 2016-05-16 DIAGNOSIS — D7581 Myelofibrosis: Secondary | ICD-10-CM

## 2016-05-16 DIAGNOSIS — I2510 Atherosclerotic heart disease of native coronary artery without angina pectoris: Secondary | ICD-10-CM | POA: Insufficient documentation

## 2016-05-16 LAB — CBC WITH DIFFERENTIAL/PLATELET
BASOS ABS: 0 10*3/uL (ref 0–0.1)
Basophils Relative: 0 %
Eosinophils Absolute: 0 10*3/uL (ref 0–0.7)
Eosinophils Relative: 0 %
HEMATOCRIT: 35.4 % — AB (ref 40.0–52.0)
HEMOGLOBIN: 12.1 g/dL — AB (ref 13.0–18.0)
LYMPHS PCT: 12 %
Lymphs Abs: 0.5 10*3/uL — ABNORMAL LOW (ref 1.0–3.6)
MCH: 29.1 pg (ref 26.0–34.0)
MCHC: 34.3 g/dL (ref 32.0–36.0)
MCV: 85 fL (ref 80.0–100.0)
MONOS PCT: 3 %
Monocytes Absolute: 0.1 10*3/uL — ABNORMAL LOW (ref 0.2–1.0)
Neutro Abs: 3.5 10*3/uL (ref 1.4–6.5)
Neutrophils Relative %: 85 %
Platelets: 122 10*3/uL — ABNORMAL LOW (ref 150–440)
RBC: 4.17 MIL/uL — AB (ref 4.40–5.90)
RDW: 19.6 % — ABNORMAL HIGH (ref 11.5–14.5)
WBC: 4.1 10*3/uL (ref 3.8–10.6)

## 2016-05-16 LAB — COMPREHENSIVE METABOLIC PANEL
ALBUMIN: 3.8 g/dL (ref 3.5–5.0)
ALK PHOS: 53 U/L (ref 38–126)
ALT: 17 U/L (ref 17–63)
AST: 19 U/L (ref 15–41)
Anion gap: 5 (ref 5–15)
BILIRUBIN TOTAL: 0.7 mg/dL (ref 0.3–1.2)
BUN: 18 mg/dL (ref 6–20)
CALCIUM: 8.3 mg/dL — AB (ref 8.9–10.3)
CO2: 22 mmol/L (ref 22–32)
CREATININE: 1.59 mg/dL — AB (ref 0.61–1.24)
Chloride: 107 mmol/L (ref 101–111)
GFR calc non Af Amer: 46 mL/min — ABNORMAL LOW (ref >60)
GFR, EST AFRICAN AMERICAN: 53 mL/min — AB (ref >60)
GLUCOSE: 118 mg/dL — AB (ref 65–99)
Potassium: 4.1 mmol/L (ref 3.5–5.1)
Sodium: 134 mmol/L — ABNORMAL LOW (ref 135–145)
TOTAL PROTEIN: 6.7 g/dL (ref 6.5–8.1)

## 2016-05-16 LAB — LACTATE DEHYDROGENASE: LDH: 608 U/L — ABNORMAL HIGH (ref 98–192)

## 2016-05-16 NOTE — Assessment & Plan Note (Signed)
#   PrimaryMyelofiborisis [Bone marrow 2010] jak-2 positive;  Low risk.  Currently on surveillance.  Patient's CBC - stable  except for mild anemia of hemoglobin 12/thrombocytopenia- 120s; LDH stable ~600s.    #  Creatinine is slightly elevated at 1.59/ recommending  Increased fluid intake/ recommend checking blood pressures at home; defer to PCP.   #  I will recommend follow-up/ labs CBC CMP and LDHevery 6 months;  I will recommend ultrasound of the abdomen quantify splenomegaly.  This could be done before his next visit/ In 6 month.  Patient was given a copy of his CBC/ CMP.   # 25 minutes face-to-face with the patient discussing the above plan of care; more than 50% of time spent on prognosis/ natural history; counseling and coordination.

## 2016-05-16 NOTE — Progress Notes (Signed)
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    No history exists.     INTERVAL HISTORY:  A very pleasant 60 year old male patient with above history of primary myelofibrosis diagnosed in 2010 is here for follow-up. His appetite is good. Denies any early satiety or significant weight loss. He lost about 7 pounds since last visit 6 months ago.  Denies any lumps or bumps. No fevers. Mild fatigue. Not significantly worse.  REVIEW OF SYSTEMS:  A complete 10 point review of system is done which is negative except mentioned above/history of present illness.   PAST MEDICAL HISTORY :  Past Medical History:  Diagnosis Date  . Anemia   . Benign hypertensive renal disease   . CAD (coronary artery disease) 08/1996   Stent placed  . Chronic kidney disease, stage II (mild)   . Depression   . Hyperlipidemia 10/2002  . Hypertension   . Myelofibrosis ([place]) 2010  . Smoker   . Thrombocytopenia ([place])     PAST SURGICAL HISTORY :   Past Surgical History:  Procedure Laterality Date  . ANGIOPLASTY    . BONE MARROW ASPIRATION  03/2009  . CORONARY STENT PLACEMENT  08/1996    FAMILY HISTORY :   Family History  Problem Relation Age of Onset  . Stroke Mother     Low BP stroke  . Depression Father   . Depression Sister     Breast  . Heart disease    . Breast cancer    . Lung cancer    . Ovarian cancer    . Stomach cancer      SOCIAL HISTORY:   Social History  Substance Use Topics  . Smoking status: Former Smoker    Packs/day: 1.00    Types: Cigarettes    Quit date: 09/22/2013  . Smokeless tobacco: Never Used  . Alcohol use  0.0 oz/week     Comment: 4x a week and 12 pack beer on weekends only    ALLERGIES:  has No Known Allergies.  MEDICATIONS:  Current Outpatient Prescriptions  Medication Sig Dispense Refill  . aspirin 325 MG tablet Take 325 mg by mouth daily.    . citalopram (CELEXA) 20 MG tablet Take 1 tablet (20 mg total) by mouth daily. 30 tablet 6   No current facility-administered medications for this visit.     PHYSICAL EXAMINATION: ECOG PERFORMANCE STATUS: 0 - Asymptomatic  BP (!) 170/90 (BP Location: Right Arm, Patient Position: Sitting)   Pulse 74   Temp 97.2 F (36.2 C) (Tympanic)   Resp 18   Ht 5' 6.5" (1.689 m)   Wt 175 lb 9.6 oz (79.7 kg)   BMI 27.92 kg/m   Filed Weights   05/16/16 1155  Weight: 175 lb 9.6 oz (79.7 kg)    GENERAL: Well-nourished well-developed; Alert, no distress and comfortable.    Accompanied by his wife. EYES: no pallor or icterus OROPHARYNX: no thrush or ulceration; good dentition  NECK: supple, no masses felt LYMPH:  no palpable lymphadenopathy in the cervical, axillary or inguinal regions LUNGS: clear to auscultation and  No wheeze or crackles HEART/CVS: regular rate & rhythm and no murmurs; No  lower extremity edema;  Positive for splenomegaly.  ABDOMEN:abdomen soft, non-tender and normal bowel sounds Musculoskeletal:no cyanosis of digits and no clubbing  PSYCH: alert & oriented x 3 with fluent speech NEURO: no focal motor/sensory deficits SKIN:  no rashes or significant lesions  LABORATORY DATA:  I have reviewed the data as listed    Component Value Date/Time   NA 134 (L) 05/16/2016 1036   NA 137 05/07/2015 0822   K 4.1 05/16/2016 1036   CL 107 05/16/2016 1036   CO2 22 05/16/2016 1036   GLUCOSE 118 (H) 05/16/2016 1036   BUN 18 05/16/2016 1036   BUN 14 05/07/2015 0822   CREATININE 1.59 (H) 05/16/2016 1036   CREATININE 1.12 09/30/2013 0953   CALCIUM 8.3 (L) 05/16/2016 1036   PROT 6.7 05/16/2016 1036   ALBUMIN 3.8 05/16/2016 1036   AST 19  05/16/2016 1036   ALT 17 05/16/2016 1036   ALKPHOS 53 05/16/2016 1036   BILITOT 0.7 05/16/2016 1036   GFRNONAA 46 (L) 05/16/2016 1036   GFRNONAA >60 09/30/2013 0953   GFRAA 53 (L) 05/16/2016 1036   GFRAA >60 09/30/2013 0953    No results found for: SPEP, UPEP  Lab Results  Component Value Date   WBC 4.1 05/16/2016   NEUTROABS 3.5 05/16/2016   HGB 12.1 (L) 05/16/2016   HCT 35.4 (L) 05/16/2016   MCV 85.0 05/16/2016   PLT 122 (L) 05/16/2016      Chemistry      Component Value Date/Time   NA 134 (L) 05/16/2016 1036   NA 137 05/07/2015 0822   K 4.1 05/16/2016 1036   CL 107 05/16/2016 1036   CO2 22 05/16/2016 1036   BUN 18 05/16/2016 1036   BUN 14 05/07/2015 0822   CREATININE 1.59 (H) 05/16/2016 1036   CREATININE 1.12 09/30/2013 0953      Component Value Date/Time   CALCIUM 8.3 (L) 05/16/2016 1036   ALKPHOS 53 05/16/2016 1036   AST 19 05/16/2016 1036   ALT 17 05/16/2016 1036   BILITOT 0.7 05/16/2016 1036       ASSESSMENT & PLAN:    Myelofibrosis ([place]) # PrimaryMyelofiborisis [Bone marrow 2010] jak-2 positive;  Low risk.  Currently on surveillance.  Patient's CBC - stable  except for mild anemia of hemoglobin 12/thrombocytopenia- 120s; LDH stable ~600s.    #  Creatinine is slightly elevated at 1.59/ recommending  Increased fluid intake/ recommend checking blood pressures at home; defer to PCP.   #  I will recommend follow-up/ labs CBC CMP and LDHevery 6 months;  I will recommend ultrasound of the abdomen quantify splenomegaly.  This could be done before his next visit/ In 6 month.  Patient was given a copy of his CBC/ CMP.   # 25 minutes face-to-face with the patient discussing the above plan of care; more than 50% of time spent on prognosis/ natural history; counseling and coordination.      Cammie Sickle, MD 05/16/2016 5:25 PM

## 2016-05-16 NOTE — Progress Notes (Signed)
Spleen ultrasound last Wednesday

## 2016-05-19 NOTE — Telephone Encounter (Signed)
Has not been seen in over a year. Needs appointment.

## 2016-05-19 NOTE — Telephone Encounter (Signed)
Please get patient scheduled, and notify Dr.Johnson when done

## 2016-05-20 ENCOUNTER — Other Ambulatory Visit: Payer: Self-pay | Admitting: Family Medicine

## 2016-05-20 NOTE — Telephone Encounter (Signed)
LMOM

## 2016-06-17 ENCOUNTER — Other Ambulatory Visit: Payer: Self-pay | Admitting: Internal Medicine

## 2016-06-17 ENCOUNTER — Other Ambulatory Visit: Payer: Self-pay | Admitting: Family Medicine

## 2016-06-17 DIAGNOSIS — D7581 Myelofibrosis: Secondary | ICD-10-CM

## 2016-06-17 DIAGNOSIS — F32A Depression, unspecified: Secondary | ICD-10-CM

## 2016-06-17 DIAGNOSIS — F329 Major depressive disorder, single episode, unspecified: Secondary | ICD-10-CM

## 2016-06-18 ENCOUNTER — Ambulatory Visit (INDEPENDENT_AMBULATORY_CARE_PROVIDER_SITE_OTHER): Payer: Commercial Managed Care - HMO | Admitting: Family Medicine

## 2016-06-18 ENCOUNTER — Encounter: Payer: Self-pay | Admitting: Family Medicine

## 2016-06-18 VITALS — BP 162/81 | HR 69 | Temp 98.4°F | Wt 176.0 lb

## 2016-06-18 DIAGNOSIS — F329 Major depressive disorder, single episode, unspecified: Secondary | ICD-10-CM

## 2016-06-18 DIAGNOSIS — F32A Depression, unspecified: Secondary | ICD-10-CM

## 2016-06-18 DIAGNOSIS — I129 Hypertensive chronic kidney disease with stage 1 through stage 4 chronic kidney disease, or unspecified chronic kidney disease: Secondary | ICD-10-CM

## 2016-06-18 MED ORDER — LISINOPRIL 10 MG PO TABS: 10.0000 mg | ORAL_TABLET | Freq: Every day | ORAL | 3 refills | Status: DC

## 2016-06-18 MED ORDER — CITALOPRAM HYDROBROMIDE 20 MG PO TABS: 20.0000 mg | ORAL_TABLET | Freq: Every day | ORAL | 6 refills | Status: DC

## 2016-06-18 NOTE — Assessment & Plan Note (Signed)
Under good control. Continue current regimen. Continue to monitor. Recheck 6 months.  

## 2016-06-18 NOTE — Assessment & Plan Note (Signed)
Not under good control. Will increase his lisinopril to 10mg  daily and recheck in 1 month.

## 2016-06-18 NOTE — Progress Notes (Signed)
BP (!) 162/81 (BP Location: Left Arm, Cuff Size: Large)   Pulse 69   Temp 98.4 F (36.9 C)   Wt 176 lb (79.8 kg)   SpO2 100%   BMI 27.98 kg/m    Subjective:    [name], male    DOB: Aug 23, 1956, 60 y.o.   MRN: [id number]  HPI: James Arnold is a 60 y.o. male  Chief Complaint  Patient presents with  . Hypertension   HYPERTENSION Hypertension status: uncontrolled  Satisfied with current treatment? no Duration of hypertension: chronic BP monitoring frequency:  not checking BP medication side effects:  no Medication compliance: excellent compliance Previous BP meds: lisinopril Aspirin: yes Recurrent headaches: no Visual changes: no Palpitations: no Dyspnea: no Chest pain: no Lower extremity edema: no Dizzy/lightheaded: no  DEPRESSION Mood status: controlled Satisfied with current treatment?: yes Symptom severity: mild  Duration of current treatment : chronic Side effects: no Medication compliance: excellent compliance Psychotherapy/counseling: no  Previous psychiatric medications: citalopram Depressed mood: no Anxious mood: no Anhedonia: no Significant weight loss or gain: no Insomnia: no  Fatigue: no Feelings of worthlessness or guilt: no Impaired concentration/indecisiveness: no Suicidal ideations: no Hopelessness: no Crying spells: no Depression screen [facility] 2/9 06/18/2016 05/07/2015  Decreased Interest 1 0  Down, Depressed, Hopeless 0 0  PHQ - 2 Score 1 0    Relevant past medical, surgical, family and social history reviewed and updated as indicated. Interim medical history since our last visit reviewed. Allergies and medications reviewed and updated.  Review of Systems  Constitutional: Negative.   Respiratory: Negative.   Cardiovascular: Negative.   Psychiatric/Behavioral: Negative.     Per HPI unless specifically indicated above     Objective:    BP (!) 162/81 (BP Location: Left Arm, Cuff Size: Large)   Pulse 69   Temp  98.4 F (36.9 C)   Wt 176 lb (79.8 kg)   SpO2 100%   BMI 27.98 kg/m   Wt Readings from Last 3 Encounters:  06/18/16 176 lb (79.8 kg)  05/16/16 175 lb 9.6 oz (79.7 kg)  11/16/15 182 lb 6.9 oz (82.7 kg)    Physical Exam  Constitutional: He is oriented to person, place, and time. He appears well-developed and well-nourished. No distress.  HENT:  Head: Normocephalic and atraumatic.  Right Ear: Hearing normal.  Left Ear: Hearing normal.  Nose: Nose normal.  Eyes: Conjunctivae and lids are normal. Right eye exhibits no discharge. Left eye exhibits no discharge. No scleral icterus.  Cardiovascular: Normal rate, regular rhythm, normal heart sounds and intact distal pulses.  Exam reveals no gallop and no friction rub.   No murmur heard. Pulmonary/Chest: Effort normal and breath sounds normal. No respiratory distress. He has no wheezes. He has no rales. He exhibits no tenderness.  Musculoskeletal: Normal range of motion.  Neurological: He is alert and oriented to person, place, and time.  Skin: Skin is warm, dry and intact. No rash noted. No erythema. No pallor.  Psychiatric: He has a normal mood and affect. His speech is normal and behavior is normal. Judgment and thought content normal. Cognition and memory are normal.  Nursing note and vitals reviewed.   Results for orders placed or performed in visit on 05/16/16  CBC with Differential  Result Value Ref Range   WBC 4.1 3.8 - 10.6 K/uL   RBC 4.17 (L) 4.40 - 5.90 MIL/uL   Hemoglobin 12.1 (L) 13.0 - 18.0 g/dL   HCT 35.4 (L) 40.0 -  52.0 %   MCV 85.0 80.0 - 100.0 fL   MCH 29.1 26.0 - 34.0 pg   MCHC 34.3 32.0 - 36.0 g/dL   RDW 19.6 (H) 11.5 - 14.5 %   Platelets 122 (L) 150 - 440 K/uL   Neutrophils Relative % 85 %   Lymphocytes Relative 12 %   Monocytes Relative 3 %   Eosinophils Relative 0 %   Basophils Relative 0 %   Neutro Abs 3.5 1.4 - 6.5 K/uL   Lymphs Abs 0.5 (L) 1.0 - 3.6 K/uL   Monocytes Absolute 0.1 (L) 0.2 - 1.0 K/uL    Eosinophils Absolute 0.0 0 - 0.7 K/uL   Basophils Absolute 0.0 0 - 0.1 K/uL   Smear Review SMEAR SCANNED   Comprehensive metabolic panel  Result Value Ref Range   Sodium 134 (L) 135 - 145 mmol/L   Potassium 4.1 3.5 - 5.1 mmol/L   Chloride 107 101 - 111 mmol/L   CO2 22 22 - 32 mmol/L   Glucose, Bld 118 (H) 65 - 99 mg/dL   BUN 18 6 - 20 mg/dL   Creatinine, Ser 1.59 (H) 0.61 - 1.24 mg/dL   Calcium 8.3 (L) 8.9 - 10.3 mg/dL   Total Protein 6.7 6.5 - 8.1 g/dL   Albumin 3.8 3.5 - 5.0 g/dL   AST 19 15 - 41 U/L   ALT 17 17 - 63 U/L   Alkaline Phosphatase 53 38 - 126 U/L   Total Bilirubin 0.7 0.3 - 1.2 mg/dL   GFR calc non Af Amer 46 (L) >60 mL/min   GFR calc Af Amer 53 (L) >60 mL/min   Anion gap 5 5 - 15  Lactate dehydrogenase  Result Value Ref Range   LDH 608 (H) 98 - 192 U/L      Assessment & Plan:   Problem List Items Addressed This Visit      Genitourinary   Benign hypertensive renal disease - Primary    Not under good control. Will increase his lisinopril to 10mg  daily and recheck in 1 month.        Other   Depression    Under good control. Continue current regimen. Continue to monitor. Recheck 6 months.       Relevant Medications   citalopram (CELEXA) 20 MG tablet    Other Visit Diagnoses   None.      Follow up plan: Return in about 4 weeks (around 07/16/2016).

## 2016-07-16 ENCOUNTER — Encounter: Payer: Self-pay | Admitting: Family Medicine

## 2016-07-16 ENCOUNTER — Other Ambulatory Visit: Payer: Self-pay | Admitting: Family Medicine

## 2016-07-16 ENCOUNTER — Ambulatory Visit (INDEPENDENT_AMBULATORY_CARE_PROVIDER_SITE_OTHER): Payer: Commercial Managed Care - HMO | Admitting: Family Medicine

## 2016-07-16 VITALS — BP 152/79 | HR 71 | Temp 98.1°F | Wt 175.7 lb

## 2016-07-16 DIAGNOSIS — I129 Hypertensive chronic kidney disease with stage 1 through stage 4 chronic kidney disease, or unspecified chronic kidney disease: Secondary | ICD-10-CM | POA: Diagnosis not present

## 2016-07-16 MED ORDER — LISINOPRIL 20 MG PO TABS: 20.0000 mg | ORAL_TABLET | Freq: Every day | ORAL | 1 refills | Status: DC

## 2016-07-16 NOTE — Assessment & Plan Note (Signed)
Will increase his lisinopril to 20mg  daily and recheck in 1 month. Call with any concerns.

## 2016-07-16 NOTE — Progress Notes (Signed)
BP (!) 152/79   Pulse 71   Temp 98.1 F (36.7 C)   Wt 175 lb 11.2 oz (79.7 kg)   SpO2 100%   BMI 27.93 kg/m    Subjective:    [name], male    DOB: 1956/03/07, 60 y.o.   MRN: [id number]  HPI: James Arnold is a 60 y.o. male  Chief Complaint  Patient presents with  . Hypertension   HYPERTENSION Hypertension status: uncontrolled  Satisfied with current treatment? no Duration of hypertension: chronic BP monitoring frequency:  not checking BP medication side effects:  no Medication compliance: excellent compliance Aspirin: no Recurrent headaches: no Visual changes: no Palpitations: no Dyspnea: no Chest pain: no Lower extremity edema: no Dizzy/lightheaded: no  Relevant past medical, surgical, family and social history reviewed and updated as indicated. Interim medical history since our last visit reviewed. Allergies and medications reviewed and updated.  Review of Systems  Constitutional: Negative.   Respiratory: Negative.   Cardiovascular: Negative.   Psychiatric/Behavioral: Negative.     Per HPI unless specifically indicated above     Objective:    BP (!) 152/79   Pulse 71   Temp 98.1 F (36.7 C)   Wt 175 lb 11.2 oz (79.7 kg)   SpO2 100%   BMI 27.93 kg/m   Wt Readings from Last 3 Encounters:  07/16/16 175 lb 11.2 oz (79.7 kg)  06/18/16 176 lb (79.8 kg)  05/16/16 175 lb 9.6 oz (79.7 kg)    Physical Exam  Constitutional: He is oriented to person, place, and time. He appears well-developed and well-nourished. No distress.  HENT:  Head: Normocephalic and atraumatic.  Right Ear: Hearing normal.  Left Ear: Hearing normal.  Nose: Nose normal.  Eyes: Conjunctivae and lids are normal. Right eye exhibits no discharge. Left eye exhibits no discharge. No scleral icterus.  Cardiovascular: Normal rate, regular rhythm, normal heart sounds and intact distal pulses.  Exam reveals no gallop and no friction rub.   No murmur  heard. Pulmonary/Chest: Effort normal and breath sounds normal. No respiratory distress. He has no wheezes. He has no rales. He exhibits no tenderness.  Musculoskeletal: Normal range of motion.  Neurological: He is alert and oriented to person, place, and time.  Skin: Skin is warm, dry and intact. No rash noted. No erythema. No pallor.  Psychiatric: He has a normal mood and affect. His speech is normal and behavior is normal. Judgment and thought content normal. Cognition and memory are normal.  Nursing note and vitals reviewed.   Results for orders placed or performed in visit on 05/16/16  CBC with Differential  Result Value Ref Range   WBC 4.1 3.8 - 10.6 K/uL   RBC 4.17 (L) 4.40 - 5.90 MIL/uL   Hemoglobin 12.1 (L) 13.0 - 18.0 g/dL   HCT 35.4 (L) 40.0 - 52.0 %   MCV 85.0 80.0 - 100.0 fL   MCH 29.1 26.0 - 34.0 pg   MCHC 34.3 32.0 - 36.0 g/dL   RDW 19.6 (H) 11.5 - 14.5 %   Platelets 122 (L) 150 - 440 K/uL   Neutrophils Relative % 85 %   Lymphocytes Relative 12 %   Monocytes Relative 3 %   Eosinophils Relative 0 %   Basophils Relative 0 %   Neutro Abs 3.5 1.4 - 6.5 K/uL   Lymphs Abs 0.5 (L) 1.0 - 3.6 K/uL   Monocytes Absolute 0.1 (L) 0.2 - 1.0 K/uL   Eosinophils Absolute 0.0 0 -  0.7 K/uL   Basophils Absolute 0.0 0 - 0.1 K/uL   Smear Review SMEAR SCANNED   Comprehensive metabolic panel  Result Value Ref Range   Sodium 134 (L) 135 - 145 mmol/L   Potassium 4.1 3.5 - 5.1 mmol/L   Chloride 107 101 - 111 mmol/L   CO2 22 22 - 32 mmol/L   Glucose, Bld 118 (H) 65 - 99 mg/dL   BUN 18 6 - 20 mg/dL   Creatinine, Ser 1.59 (H) 0.61 - 1.24 mg/dL   Calcium 8.3 (L) 8.9 - 10.3 mg/dL   Total Protein 6.7 6.5 - 8.1 g/dL   Albumin 3.8 3.5 - 5.0 g/dL   AST 19 15 - 41 U/L   ALT 17 17 - 63 U/L   Alkaline Phosphatase 53 38 - 126 U/L   Total Bilirubin 0.7 0.3 - 1.2 mg/dL   GFR calc non Af Amer 46 (L) >60 mL/min   GFR calc Af Amer 53 (L) >60 mL/min   Anion gap 5 5 - 15  Lactate dehydrogenase   Result Value Ref Range   LDH 608 (H) 98 - 192 U/L      Assessment & Plan:   Problem List Items Addressed This Visit      Genitourinary   Benign hypertensive renal disease - Primary    Will increase his lisinopril to 20mg  daily and recheck in 1 month. Call with any concerns.       Relevant Orders   Basic metabolic panel    Other Visit Diagnoses   None.      Follow up plan: Return in about 4 weeks (around 08/13/2016) for BP follow up.

## 2016-07-17 ENCOUNTER — Encounter: Payer: Self-pay | Admitting: Family Medicine

## 2016-07-17 LAB — BASIC METABOLIC PANEL
BUN/Creatinine Ratio: 11 (ref 10–24)
BUN: 18 mg/dL (ref 8–27)
CALCIUM: 8.9 mg/dL (ref 8.6–10.2)
CHLORIDE: 101 mmol/L (ref 96–106)
CO2: 25 mmol/L (ref 18–29)
Creatinine, Ser: 1.57 mg/dL — ABNORMAL HIGH (ref 0.76–1.27)
GFR calc Af Amer: 55 mL/min/{1.73_m2} — ABNORMAL LOW (ref >59)
GFR calc non Af Amer: 47 mL/min/{1.73_m2} — ABNORMAL LOW (ref >59)
Glucose: 94 mg/dL (ref 65–99)
POTASSIUM: 5.1 mmol/L (ref 3.5–5.2)
Sodium: 138 mmol/L (ref 134–144)

## 2016-07-29 ENCOUNTER — Telehealth: Payer: Self-pay | Admitting: *Deleted

## 2016-07-29 NOTE — Telephone Encounter (Signed)
Wife stated she will contact PCP

## 2016-07-29 NOTE — Telephone Encounter (Signed)
Dr. Rogue Bussing asked that he follow-up with this pcp regarding these forms.  We only see the patient twice a year.

## 2016-07-29 NOTE — Telephone Encounter (Signed)
States they have some insurance papers about him not being able to work and states that Dr Ma Hillock had done the first ones. He does not have an appt until February. PLEASE ADVISE IF WE WILL COMPLETE THESE FOR THEM

## 2016-08-12 ENCOUNTER — Encounter: Payer: Self-pay | Admitting: Family Medicine

## 2016-08-12 ENCOUNTER — Ambulatory Visit (INDEPENDENT_AMBULATORY_CARE_PROVIDER_SITE_OTHER): Payer: Commercial Managed Care - HMO | Admitting: Family Medicine

## 2016-08-12 ENCOUNTER — Other Ambulatory Visit: Payer: Self-pay | Admitting: Family Medicine

## 2016-08-12 VITALS — BP 146/80 | HR 83 | Temp 98.0°F | Wt 171.8 lb

## 2016-08-12 DIAGNOSIS — I129 Hypertensive chronic kidney disease with stage 1 through stage 4 chronic kidney disease, or unspecified chronic kidney disease: Secondary | ICD-10-CM

## 2016-08-12 DIAGNOSIS — D7581 Myelofibrosis: Secondary | ICD-10-CM | POA: Diagnosis not present

## 2016-08-12 MED ORDER — HYDROCHLOROTHIAZIDE 25 MG PO TABS: 25.0000 mg | ORAL_TABLET | Freq: Every day | ORAL | 1 refills | Status: DC

## 2016-08-12 NOTE — Assessment & Plan Note (Signed)
Paperwork for life insurance filled out. See scanned document.

## 2016-08-12 NOTE — Assessment & Plan Note (Signed)
Still not under great control. Will add HCTZ and recheck in 1 month. Call with any concerns. BMP next month.

## 2016-08-12 NOTE — Progress Notes (Signed)
BP (!) 146/80 (BP Location: Left Arm, Cuff Size: Normal)   Pulse 83   Temp 98 F (36.7 C)   Wt 171 lb 12.8 oz (77.9 kg)   SpO2 97%   BMI 27.31 kg/m    Subjective:    [name], male    DOB: Aug 17, 1956, 60 y.o.   MRN: [id number]  HPI: James Arnold is a 60 y.o. male  Chief Complaint  Patient presents with  . paperwork   HYPERTENSION Hypertension status: better  Satisfied with current treatment? no Duration of hypertension: chronic BP monitoring frequency:  not checking BP range:  BP medication side effects:  no Medication compliance: excellent compliance Previous BP meds: lisinopril Aspirin: yes Recurrent headaches: no Visual changes: no Palpitations: no Dyspnea: no Chest pain: no Lower extremity edema: no Dizzy/lightheaded: no  Needs paper for life insurance filled out. Form filled out today and scanned into chart.  Relevant past medical, surgical, family and social history reviewed and updated as indicated. Interim medical history since our last visit reviewed. Allergies and medications reviewed and updated.  Review of Systems  Constitutional: Positive for fatigue. Negative for activity change, appetite change, chills, diaphoresis, fever and unexpected weight change.  Respiratory: Negative.   Cardiovascular: Negative.   Psychiatric/Behavioral: Negative.     Per HPI unless specifically indicated above     Objective:    BP (!) 146/80 (BP Location: Left Arm, Cuff Size: Normal)   Pulse 83   Temp 98 F (36.7 C)   Wt 171 lb 12.8 oz (77.9 kg)   SpO2 97%   BMI 27.31 kg/m   Wt Readings from Last 3 Encounters:  08/12/16 171 lb 12.8 oz (77.9 kg)  07/16/16 175 lb 11.2 oz (79.7 kg)  06/18/16 176 lb (79.8 kg)    Physical Exam  Constitutional: He is oriented to person, place, and time. He appears well-developed and well-nourished. No distress.  HENT:  Head: Normocephalic and atraumatic.  Right Ear: Hearing and external ear normal.  Left Ear:  Hearing normal.  Nose: Nose normal.  Eyes: Conjunctivae and lids are normal. Right eye exhibits no discharge. Left eye exhibits no discharge. No scleral icterus.  Cardiovascular: Normal rate, regular rhythm, normal heart sounds and intact distal pulses.  Exam reveals no gallop and no friction rub.   No murmur heard. Pulmonary/Chest: Effort normal and breath sounds normal. No respiratory distress. He has no wheezes. He has no rales. He exhibits no tenderness.  Musculoskeletal: Normal range of motion.  Neurological: He is alert and oriented to person, place, and time.  Skin: Skin is warm, dry and intact. No rash noted. No erythema. No pallor.  Psychiatric: He has a normal mood and affect. His speech is normal and behavior is normal. Judgment and thought content normal. Cognition and memory are normal.  Nursing note and vitals reviewed.   Results for orders placed or performed in visit on 93/79/02  Basic metabolic panel  Result Value Ref Range   Glucose 94 65 - 99 mg/dL   BUN 18 8 - 27 mg/dL   Creatinine, Ser 1.57 (H) 0.76 - 1.27 mg/dL   GFR calc non Af Amer 47 (L) >59 mL/min/1.73   GFR calc Af Amer 55 (L) >59 mL/min/1.73   BUN/Creatinine Ratio 11 10 - 24   Sodium 138 134 - 144 mmol/L   Potassium 5.1 3.5 - 5.2 mmol/L   Chloride 101 96 - 106 mmol/L   CO2 25 18 - 29 mmol/L  Calcium 8.9 8.6 - 10.2 mg/dL      Assessment & Plan:   Problem List Items Addressed This Visit      Genitourinary   Benign hypertensive renal disease - Primary    Still not under great control. Will add HCTZ and recheck in 1 month. Call with any concerns. BMP next month.        Other   Myelofibrosis ([place])    Paperwork for life insurance filled out. See scanned document.           Follow up plan: Return in about 4 weeks (around 09/09/2016) for BP follow up.

## 2016-08-13 ENCOUNTER — Ambulatory Visit: Payer: Commercial Managed Care - HMO | Admitting: Family Medicine

## 2016-08-31 ENCOUNTER — Other Ambulatory Visit: Payer: Self-pay | Admitting: Internal Medicine

## 2016-08-31 DIAGNOSIS — D7581 Myelofibrosis: Secondary | ICD-10-CM

## 2016-08-31 DIAGNOSIS — F329 Major depressive disorder, single episode, unspecified: Secondary | ICD-10-CM

## 2016-08-31 DIAGNOSIS — F32A Depression, unspecified: Secondary | ICD-10-CM

## 2016-09-04 ENCOUNTER — Other Ambulatory Visit: Payer: Self-pay | Admitting: Family Medicine

## 2016-09-04 ENCOUNTER — Ambulatory Visit (INDEPENDENT_AMBULATORY_CARE_PROVIDER_SITE_OTHER): Payer: Commercial Managed Care - HMO | Admitting: Family Medicine

## 2016-09-04 ENCOUNTER — Encounter: Payer: Self-pay | Admitting: Family Medicine

## 2016-09-04 DIAGNOSIS — I129 Hypertensive chronic kidney disease with stage 1 through stage 4 chronic kidney disease, or unspecified chronic kidney disease: Secondary | ICD-10-CM | POA: Diagnosis not present

## 2016-09-04 NOTE — Patient Instructions (Addendum)
Orthostatic Hypotension Orthostatic hypotension is a sudden drop in blood pressure that happens when you quickly change positions, such as when you get up from a seated or lying position. Blood pressure is a measurement of how strongly, or weakly, your blood is pressing against the walls of your arteries. Arteries are blood vessels that carry blood from your heart throughout your body. When blood pressure is too low, you may not get enough blood to your brain or to the rest of your organs. This can cause weakness, light-headedness, rapid heartbeat, and fainting. This can last for just a few seconds or for up to a few minutes. Orthostatic hypotension is usually not a serious problem. However, if it happens frequently or gets worse, it may be a sign of something more serious. What are the causes? This condition may be caused by:  Sudden changes in posture, such as standing up quickly after you have been sitting or lying down.  Blood loss.  Loss of body fluids (dehydration).  Heart problems.  Hormone (endocrine) problems.  Pregnancy.  Severe infection.  Lack of certain nutrients.  Severe allergic reactions (anaphylaxis).  Certain medicines, such as blood pressure medicine or medicines that make the body lose excess fluids (diuretics). Sometimes, this condition can be caused by not taking medicine as directed, such as taking too much of a certain medicine. What increases the risk? Certain factors can make you more likely to develop orthostatic hypotension, including:  Age. Risk increases as you get older.  Conditions that affect the heart or the central nervous system.  Taking certain medicines, such as blood pressure medicine or diuretics.  Being pregnant. What are the signs or symptoms? Symptoms of this condition may include:  Weakness.  Light-headedness.  Dizziness.  Blurred vision.  Fatigue.  Rapid heartbeat.  Fainting, in severe cases. How is this diagnosed? This  condition is diagnosed based on:  Your medical history.  Your symptoms.  Your blood pressure measurement. Your health care provider will check your blood pressure when you are:  Lying down.  Sitting.  Standing. A blood pressure reading is recorded as two numbers, such as "120 over 80" (or 120/80). The first ("top") number is called the systolic pressure. It is a measure of the pressure in your arteries as your heart beats. The second ("bottom") number is called the diastolic pressure. It is a measure of the pressure in your arteries when your heart relaxes between beats. Blood pressure is measured in a unit called mm Hg. Healthy blood pressure for adults is 120/80. If your blood pressure is below 90/60, you may be diagnosed with hypotension. Other information or tests that may be used to diagnose orthostatic hypotension include:  Your other vital signs, such as your heart rate and temperature.  Blood tests.  Tilt table test. For this test, you will be safely secured to a table that moves you from a lying position to an upright position. Your heart rhythm and blood pressure will be monitored during the test. How is this treated? Treatment for this condition may include:  Changing your diet. This may involve eating more salt (sodium) or drinking more water.  Taking medicines to raise your blood pressure.  Changing the dosage of certain medicines you are taking that might be lowering your blood pressure.  Wearing compression stockings. These stockings help to prevent blood clots and reduce swelling in your legs. In some cases, you may need to go to the hospital for:  Fluid replacement. This means you will   receive fluids through an IV tube.  Blood replacement. This means you will receive donated blood through an IV tube (transfusion).  Treating an infection or heart problems, if this applies.  Monitoring. You may need to be monitored while medicines that you are taking wear  off. Follow these instructions at home: Eating and drinking    Drink enough fluid to keep your urine clear or pale yellow.  Eat a healthy diet and follow instructions from your health care provider about eating or drinking restrictions. A healthy diet includes:  Fresh fruits and vegetables.  Whole grains.  Lean meats.  Low-fat dairy products.  Eat extra salt only as directed. Do not add extra salt to your diet unless your health care provider told you to do that.  Eat frequent, small meals.  Avoid standing up suddenly after eating. Medicines   Take over-the-counter and prescription medicines only as told by your health care provider.  Follow instructions from your health care provider about changing the dosage of your current medicines, if this applies.  Do not stop or adjust any of your medicines on your own. General instructions   Wear compression stockings as told by your health care provider.  Get up slowly from lying down or sitting positions. This gives your blood pressure a chance to adjust.  Avoid hot showers and excessive heat as directed by your health care provider.  Return to your normal activities as told by your health care provider. Ask your health care provider what activities are safe for you.  Do not use any products that contain nicotine or tobacco, such as cigarettes and e-cigarettes. If you need help quitting, ask your health care provider.  Keep all follow-up visits as told by your health care provider. This is important. Contact a health care provider if:  You vomit.  You have diarrhea.  You have a fever for more than 2-3 days.  You feel more thirsty than usual.  You feel weak and tired. Get help right away if:  You have chest pain.  You have a fast or irregular heartbeat.  You develop numbness in any part of your body.  You cannot move your arms or your legs.  You have trouble speaking.  You become sweaty or feel  lightheaded.  You faint.  You feel short of breath.  You have trouble staying awake.  You feel confused. This information is not intended to replace advice given to you by your health care provider. Make sure you discuss any questions you have with your health care provider. Document Released: 08/29/2002 Document Revised: 05/27/2016 Document Reviewed: 02/29/2016 Elsevier Interactive Patient Education  2017 [employer]  

## 2016-09-04 NOTE — Progress Notes (Signed)
BP 122/82   Pulse 80   Temp 98.7 F (37.1 C)   Wt 172 lb (78 kg)   SpO2 100%   BMI 27.35 kg/m    Subjective:    [name], male    DOB: 02/23/1956, 60 y.o.   MRN: [id number]  HPI: James Arnold is a 60 y.o. male  Chief Complaint  Patient presents with  . Hypertension   HYPERTENSION Hypertension status: better  Satisfied with current treatment? yes Duration of hypertension: chronic BP monitoring frequency:  not checking BP medication side effects:  yes Medication compliance: excellent compliance Previous BP meds: lisinopril HCTZ Aspirin: no Recurrent headaches: no Visual changes: no Palpitations: no Dyspnea: no Chest pain: no Lower extremity edema: no Dizzy/lightheaded: yes  Relevant past medical, surgical, family and social history reviewed and updated as indicated. Interim medical history since our last visit reviewed. Allergies and medications reviewed and updated.  Review of Systems  Constitutional: Negative.   Respiratory: Negative.   Cardiovascular: Negative.   Psychiatric/Behavioral: Negative.     Per HPI unless specifically indicated above     Objective:    BP 122/82   Pulse 80   Temp 98.7 F (37.1 C)   Wt 172 lb (78 kg)   SpO2 100%   BMI 27.35 kg/m   Wt Readings from Last 3 Encounters:  09/04/16 172 lb (78 kg)  08/12/16 171 lb 12.8 oz (77.9 kg)  07/16/16 175 lb 11.2 oz (79.7 kg)    Orthostatic VS for the past 24 hrs:  BP- Lying Pulse- Lying BP- Sitting Pulse- Sitting BP- Standing at 0 minutes Pulse- Standing at 0 minutes  09/04/16 0943 151/83 86 145/79 82 137/71 90   Physical Exam  Constitutional: He is oriented to person, place, and time. He appears well-developed and well-nourished. No distress.  HENT:  Head: Normocephalic and atraumatic.  Right Ear: Hearing normal.  Left Ear: Hearing normal.  Nose: Nose normal.  Eyes: Conjunctivae and lids are normal. Right eye exhibits no discharge. Left eye exhibits no  discharge. No scleral icterus.  Cardiovascular: Normal rate, regular rhythm, normal heart sounds and intact distal pulses.  Exam reveals no gallop and no friction rub.   No murmur heard. Pulmonary/Chest: Effort normal and breath sounds normal. No respiratory distress. He has no wheezes. He has no rales. He exhibits no tenderness.  Musculoskeletal: Normal range of motion.  Neurological: He is alert and oriented to person, place, and time.  Skin: Skin is intact. No rash noted.  Psychiatric: He has a normal mood and affect. His speech is normal and behavior is normal. Judgment and thought content normal. Cognition and memory are normal.  Nursing note and vitals reviewed.   Results for orders placed or performed in visit on 37/85/88  Basic metabolic panel  Result Value Ref Range   Glucose 94 65 - 99 mg/dL   BUN 18 8 - 27 mg/dL   Creatinine, Ser 1.57 (H) 0.76 - 1.27 mg/dL   GFR calc non Af Amer 47 (L) >59 mL/min/1.73   GFR calc Af Amer 55 (L) >59 mL/min/1.73   BUN/Creatinine Ratio 11 10 - 24   Sodium 138 134 - 144 mmol/L   Potassium 5.1 3.5 - 5.2 mmol/L   Chloride 101 96 - 106 mmol/L   CO2 25 18 - 29 mmol/L   Calcium 8.9 8.6 - 10.2 mg/dL      Assessment & Plan:   Problem List Items Addressed This Visit  Genitourinary   Benign hypertensive renal disease    Better on recheck. Becoming slightly orthostatic. Increase fluid intake. Sit up/Stand up slowly. Continue to monitor.           Follow up plan: Return in about 6 months (around 03/05/2017) for Wellness.

## 2016-09-04 NOTE — Assessment & Plan Note (Addendum)
Better on recheck. Becoming slightly orthostatic. Increase fluid intake. Sit up/Stand up slowly. Continue to monitor.

## 2016-10-13 ENCOUNTER — Telehealth: Payer: Self-pay | Admitting: Family Medicine

## 2016-10-13 ENCOUNTER — Other Ambulatory Visit: Payer: Self-pay | Admitting: Family Medicine

## 2016-10-13 MED ORDER — HYDROCHLOROTHIAZIDE 25 MG PO TABS: 25.0000 mg | ORAL_TABLET | Freq: Every day | ORAL | 1 refills | Status: DC

## 2016-10-13 MED ORDER — LISINOPRIL 20 MG PO TABS: 20.0000 mg | ORAL_TABLET | Freq: Every day | ORAL | 1 refills | Status: DC

## 2016-10-13 NOTE — Telephone Encounter (Signed)
Patient needs a refill on his hydrochlorothizide 25mg    [facility]  He will be out by [employer] James Arnold

## 2016-10-13 NOTE — Telephone Encounter (Signed)
Rx sent to his pharmacy

## 2016-10-13 NOTE — Telephone Encounter (Signed)
Called patient to let him know. James Arnold

## 2016-11-12 ENCOUNTER — Ambulatory Visit
Admission: RE | Admit: 2016-11-12 | Discharge: 2016-11-12 | Disposition: A | Discharge status: Home / Self Care | Payer: Medicare HMO | Source: Ambulatory Visit | Attending: Internal Medicine | Admitting: Internal Medicine

## 2016-11-12 DIAGNOSIS — D7581 Myelofibrosis: Secondary | ICD-10-CM

## 2016-11-12 DIAGNOSIS — R161 Splenomegaly, not elsewhere classified: Secondary | ICD-10-CM | POA: Insufficient documentation

## 2016-11-14 ENCOUNTER — Inpatient Hospital Stay: Payer: Medicare HMO | Attending: Internal Medicine | Admitting: Internal Medicine

## 2016-11-14 ENCOUNTER — Inpatient Hospital Stay: Payer: Medicare HMO

## 2016-11-14 DIAGNOSIS — D7581 Myelofibrosis: Secondary | ICD-10-CM

## 2016-11-14 DIAGNOSIS — Z7982 Long term (current) use of aspirin: Secondary | ICD-10-CM

## 2016-11-14 DIAGNOSIS — D649 Anemia, unspecified: Secondary | ICD-10-CM | POA: Insufficient documentation

## 2016-11-14 DIAGNOSIS — R112 Nausea with vomiting, unspecified: Secondary | ICD-10-CM

## 2016-11-14 DIAGNOSIS — F17210 Nicotine dependence, cigarettes, uncomplicated: Secondary | ICD-10-CM | POA: Diagnosis not present

## 2016-11-14 DIAGNOSIS — Z79899 Other long term (current) drug therapy: Secondary | ICD-10-CM | POA: Diagnosis not present

## 2016-11-14 DIAGNOSIS — I129 Hypertensive chronic kidney disease with stage 1 through stage 4 chronic kidney disease, or unspecified chronic kidney disease: Secondary | ICD-10-CM

## 2016-11-14 DIAGNOSIS — E785 Hyperlipidemia, unspecified: Secondary | ICD-10-CM | POA: Diagnosis not present

## 2016-11-14 DIAGNOSIS — F329 Major depressive disorder, single episode, unspecified: Secondary | ICD-10-CM | POA: Diagnosis not present

## 2016-11-14 DIAGNOSIS — D471 Chronic myeloproliferative disease: Secondary | ICD-10-CM | POA: Diagnosis not present

## 2016-11-14 DIAGNOSIS — R161 Splenomegaly, not elsewhere classified: Secondary | ICD-10-CM | POA: Diagnosis not present

## 2016-11-14 DIAGNOSIS — D696 Thrombocytopenia, unspecified: Secondary | ICD-10-CM | POA: Diagnosis not present

## 2016-11-14 DIAGNOSIS — I2510 Atherosclerotic heart disease of native coronary artery without angina pectoris: Secondary | ICD-10-CM

## 2016-11-14 DIAGNOSIS — N182 Chronic kidney disease, stage 2 (mild): Secondary | ICD-10-CM

## 2016-11-14 LAB — COMPREHENSIVE METABOLIC PANEL
ALT: 16 U/L — AB (ref 17–63)
AST: 19 U/L (ref 15–41)
Albumin: 4.4 g/dL (ref 3.5–5.0)
Alkaline Phosphatase: 52 U/L (ref 38–126)
Anion gap: 8 (ref 5–15)
BILIRUBIN TOTAL: 1.1 mg/dL (ref 0.3–1.2)
BUN: 37 mg/dL — ABNORMAL HIGH (ref 6–20)
CALCIUM: 9 mg/dL (ref 8.9–10.3)
CO2: 21 mmol/L — AB (ref 22–32)
Chloride: 101 mmol/L (ref 101–111)
Creatinine, Ser: 1.82 mg/dL — ABNORMAL HIGH (ref 0.61–1.24)
GFR calc non Af Amer: 39 mL/min — ABNORMAL LOW (ref >60)
GFR, EST AFRICAN AMERICAN: 45 mL/min — AB (ref >60)
Glucose, Bld: 90 mg/dL (ref 65–99)
Potassium: 5.1 mmol/L (ref 3.5–5.1)
SODIUM: 130 mmol/L — AB (ref 135–145)
Total Protein: 7.2 g/dL (ref 6.5–8.1)

## 2016-11-14 LAB — CBC WITH DIFFERENTIAL/PLATELET
Basophils Absolute: 0 10*3/uL (ref 0–0.1)
Basophils Relative: 0 %
EOS ABS: 0 10*3/uL (ref 0–0.7)
Eosinophils Relative: 0 %
HEMATOCRIT: 34.8 % — AB (ref 40.0–52.0)
HEMOGLOBIN: 12 g/dL — AB (ref 13.0–18.0)
LYMPHS ABS: 0.8 10*3/uL — AB (ref 1.0–3.6)
LYMPHS PCT: 20 %
MCH: 29.3 pg (ref 26.0–34.0)
MCHC: 34.3 g/dL (ref 32.0–36.0)
MCV: 85.4 fL (ref 80.0–100.0)
Monocytes Absolute: 0.2 10*3/uL (ref 0.2–1.0)
Monocytes Relative: 4 %
NEUTROS ABS: 3.2 10*3/uL (ref 1.4–6.5)
NEUTROS PCT: 76 %
Platelets: 133 10*3/uL — ABNORMAL LOW (ref 150–400)
RBC: 4.08 MIL/uL — AB (ref 4.40–5.90)
RDW: 18.8 % — ABNORMAL HIGH (ref 11.5–14.5)
WBC: 4.2 10*3/uL (ref 3.8–10.6)

## 2016-11-14 LAB — LACTATE DEHYDROGENASE: LDH: 527 U/L — ABNORMAL HIGH (ref 98–192)

## 2016-11-14 NOTE — Assessment & Plan Note (Addendum)
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  Currently on surveillance.  Patient's CBC - stable except for mild anemia, hemoglobin 12/thrombocytopenia- 130s; LDH stable ~500s.  Consider bone marrow biopsy if platelets decrease. Abdominal ultrasound on 11/12/16 showed slight splenic enlargement from previous scan on 05/14/16, no evident splenic lesions.  #  Creatinine continuing to increase, currently 1.82.  Likely unrelated to myelofibrosis.  Continuing to recommend increased fluid intake/ recommend checking blood pressures at home; defer to PCP for possible referral to nephrologist.   % Smoking cessation: Discussed smoking cessation options, recommended follow-up with PCP.   #  I will recommend follow-up/ labs CBC CMP and LDH every 6 months;  I will recommend ultrasound of the abdomen to quantify splenomegaly.  This could be done before his next visit/ In 6 month.  Discussed current labs and ultrasound results with patient.

## 2016-11-14 NOTE — Progress Notes (Signed)
Patient here today for follow up.  Patient states no new concerns today  

## 2016-11-14 NOTE — Progress Notes (Signed)
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    No history exists.     INTERVAL HISTORY:  A very pleasant 61 year old male patient with above history of primary myelofibrosis diagnosed in 2010 is here for follow-up. He reports that he has nausea and vomiting approximately once per week after eating.  His appetite is very good. Denies any early satiety or weight loss.   Denies any lumps or bumps. No fevers. Mild fatigue. Not significantly worse.  No night sweats.  Patient reported considering smoking cessation; stated that Chantix did not work for him previously, and "just made me mean". Understands that he should follow-up with PCP when ready.  REVIEW OF SYSTEMS:  A complete 10 point review of system is done which is negative except mentioned above/history of present illness.   PAST MEDICAL HISTORY :  Past Medical History:  Diagnosis Date  . Anemia   . Benign hypertensive renal disease   . CAD (coronary artery disease) 08/1996   Stent placed  . Chronic kidney disease, stage II (mild)   . Depression   . Hyperlipidemia 10/2002  . Hypertension   . Myelofibrosis ([place]) 2010  . Smoker   . Thrombocytopenia ([place])     PAST SURGICAL HISTORY :   Past Surgical History:  Procedure Laterality Date  . ANGIOPLASTY    . BONE MARROW ASPIRATION  03/2009  . CORONARY STENT PLACEMENT  08/1996    FAMILY HISTORY :   Family History  Problem Relation Age of Onset  . Stroke Mother     Low BP stroke  . Depression Father   . Depression Sister     Breast  . Heart disease    . Breast cancer    . Lung cancer    . Ovarian cancer    .  Stomach cancer      SOCIAL HISTORY:   Social History  Substance Use Topics  . Smoking status: Former Smoker    Packs/day: 1.00    Types: Cigarettes    Quit date: 09/22/2013  . Smokeless tobacco: Never Used  . Alcohol use 0.0 oz/week     Comment: 4x a week and 12 pack beer on weekends only    ALLERGIES:  has No Known Allergies.  MEDICATIONS:  Current Outpatient Prescriptions  Medication Sig Dispense Refill  . aspirin 325 MG tablet Take 325 mg by mouth daily.    . citalopram (CELEXA) 20 MG tablet Take 1 tablet (20 mg total) by mouth daily. 30 tablet 6  . hydrochlorothiazide (HYDRODIURIL) 25 MG tablet Take 1 tablet (25 mg total) by mouth daily. 90 tablet 1  . lisinopril (PRINIVIL,ZESTRIL) 20 MG tablet Take 1 tablet (20 mg total) by mouth daily. 90 tablet 1   No current facility-administered medications for this visit.     PHYSICAL EXAMINATION: ECOG PERFORMANCE STATUS: 0 - Asymptomatic  BP 135/74 (BP Location: Right Arm, Patient Position: Sitting)   Pulse 78   Temp 98.6 F (37 C) (Tympanic)   Wt 173 lb 6 oz (78.6 kg)   BMI 27.56 kg/m   Filed Weights   11/14/16 0936  Weight: 173 lb 6 oz (78.6 kg)  GENERAL: Well-nourished well-developed; Alert, no distress and comfortable.    Accompanied by his wife. EYES: no pallor or icterus OROPHARYNX: no thrush or ulceration; good dentition  NECK: supple, no masses felt LYMPH:  no palpable lymphadenopathy in the cervical, axillary or inguinal regions LUNGS: clear to auscultation and  No wheeze or crackles HEART/CVS: regular rate & rhythm and no murmurs; No lower extremity edema;  Positive for splenomegaly.  ABDOMEN:abdomen soft, non-tender and normal bowel sounds Musculoskeletal:no cyanosis of digits and no clubbing  PSYCH: alert & oriented x 3 with fluent speech NEURO: no focal motor/sensory deficits SKIN:  no rashes or significant lesions  LABORATORY DATA:  I have reviewed the data as listed    Component Value Date/Time    NA 130 (L) 11/14/2016 0910   NA 138 07/16/2016 0848   K 5.1 11/14/2016 0910   CL 101 11/14/2016 0910   CO2 21 (L) 11/14/2016 0910   GLUCOSE 90 11/14/2016 0910   BUN 37 (H) 11/14/2016 0910   BUN 18 07/16/2016 0848   CREATININE 1.82 (H) 11/14/2016 0910   CREATININE 1.12 09/30/2013 0953   CALCIUM 9.0 11/14/2016 0910   PROT 7.2 11/14/2016 0910   ALBUMIN 4.4 11/14/2016 0910   AST 19 11/14/2016 0910   ALT 16 (L) 11/14/2016 0910   ALKPHOS 52 11/14/2016 0910   BILITOT 1.1 11/14/2016 0910   GFRNONAA 39 (L) 11/14/2016 0910   GFRNONAA >60 09/30/2013 0953   GFRAA 45 (L) 11/14/2016 0910   GFRAA >60 09/30/2013 0953    No results found for: SPEP, UPEP  Lab Results  Component Value Date   WBC 4.2 11/14/2016   NEUTROABS 3.2 11/14/2016   HGB 12.0 (L) 11/14/2016   HCT 34.8 (L) 11/14/2016   MCV 85.4 11/14/2016   PLT 133 (L) 11/14/2016      Chemistry      Component Value Date/Time   NA 130 (L) 11/14/2016 0910   NA 138 07/16/2016 0848   K 5.1 11/14/2016 0910   CL 101 11/14/2016 0910   CO2 21 (L) 11/14/2016 0910   BUN 37 (H) 11/14/2016 0910   BUN 18 07/16/2016 0848   CREATININE 1.82 (H) 11/14/2016 0910   CREATININE 1.12 09/30/2013 0953      Component Value Date/Time   CALCIUM 9.0 11/14/2016 0910   ALKPHOS 52 11/14/2016 0910   AST 19 11/14/2016 0910   ALT 16 (L) 11/14/2016 0910   BILITOT 1.1 11/14/2016 0910       ASSESSMENT & PLAN:    Myelofibrosis ([place]) # 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  Currently on surveillance.  Patient's CBC - stable except for mild anemia, hemoglobin 12/thrombocytopenia- 130s; LDH stable ~500s.  Consider bone marrow biopsy if platelets decrease.  #  Creatinine continuing to increase, currently 1.82.  Likely unrelated to myelofibrosis.  Continuing to recommend increased fluid intake/ recommend checking blood pressures at home; defer to PCP for possible referral to nephrologist.   % Smoking cessation: Discussed  smoking cessation options, recommended follow-up with PCP.   #  I will recommend follow-up/ labs CBC CMP and LDH every 6 months;  I will recommend ultrasound of the abdomen to quantify splenomegaly.  This could be done before his next visit/ In 6 month.  Discussed current labs and ultrasound results with patient.       Lucendia Herrlich, NP 11/14/2016 10:32 AM

## 2017-01-17 ENCOUNTER — Other Ambulatory Visit: Payer: Self-pay | Admitting: Family Medicine

## 2017-01-17 DIAGNOSIS — F329 Major depressive disorder, single episode, unspecified: Secondary | ICD-10-CM

## 2017-01-17 DIAGNOSIS — F32A Depression, unspecified: Secondary | ICD-10-CM

## 2017-02-05 ENCOUNTER — Other Ambulatory Visit: Payer: Self-pay | Admitting: Family Medicine

## 2017-02-05 ENCOUNTER — Encounter: Payer: Self-pay | Admitting: Family Medicine

## 2017-02-05 MED ORDER — CITALOPRAM HYDROBROMIDE 20 MG PO TABS: ORAL_TABLET | ORAL | 1 refills | Status: DC

## 2017-02-05 NOTE — Telephone Encounter (Signed)
Pt wife requesting refill for prescription citalopram (CELEXA) 20 MG tablet [[id number]] please call her -knb

## 2017-02-05 NOTE — Telephone Encounter (Signed)
Rx sent to his pharmacy

## 2017-02-18 ENCOUNTER — Ambulatory Visit (INDEPENDENT_AMBULATORY_CARE_PROVIDER_SITE_OTHER): Payer: Medicare HMO

## 2017-02-18 VITALS — BP 124/68 | HR 62 | Resp 16 | Ht 67.0 in | Wt 171.1 lb

## 2017-02-18 DIAGNOSIS — Z0000 Encounter for general adult medical examination without abnormal findings: Secondary | ICD-10-CM | POA: Diagnosis not present

## 2017-02-18 NOTE — Patient Instructions (Addendum)
[name] , Thank you for taking time to come for your Medicare Wellness Visit. I appreciate your ongoing commitment to your health goals. Please review the following plan we discussed and let me know if I can assist you in the future.   Screening recommendations/referrals: Colonoscopy: Completed 05/2008, due 05/2018 Recommended yearly ophthalmology/optometry visit for glaucoma screening and checkup Recommended yearly dental visit for hygiene and checkup  Vaccinations: Influenza vaccine: due 05/2017 Pneumococcal vaccine: completed Tdap vaccine: up to date Shingles vaccine:  declined  Advanced directives: Advance directive discussed with you today. I have provided a copy for you to complete at home and have notarized. Once this is complete please bring a copy in to our office so we can scan it into your chart.  Conditions/risks identified: Recommend drinking at least 2-3 glasses of water a day.  Next appointment: Follow up with Dr. Wynetta Emery on 03/05/2017 at 10:00am. Follow up in one year for your annual wellness exam.   Preventive Care 40-64 Years, Male Preventive care refers to lifestyle choices and visits with your health care provider that can promote health and wellness. What does preventive care include?  A yearly physical exam. This is also called an annual well check.  Dental exams once or twice a year.  Routine eye exams. Ask your health care provider how often you should have your eyes checked.  Personal lifestyle choices, including:  Daily care of your teeth and gums.  Regular physical activity.  Eating a healthy diet.  Avoiding tobacco and drug use.  Limiting alcohol use.  Practicing safe sex.  Taking low-dose aspirin every day starting at age 44. What happens during an annual well check? The services and screenings done by your health care provider during your annual well check will depend on your age, overall health, lifestyle risk factors, and family history of  disease. Counseling  Your health care provider may ask you questions about your:  Alcohol use.  Tobacco use.  Drug use.  Emotional well-being.  Home and relationship well-being.  Sexual activity.  Eating habits.  Work and work Statistician. Screening  You may have the following tests or measurements:  Height, weight, and BMI.  Blood pressure.  Lipid and cholesterol levels. These may be checked every 5 years, or more frequently if you are over 66 years old.  Skin check.  Lung cancer screening. You may have this screening every year starting at age 7 if you have a 30-pack-year history of smoking and currently smoke or have quit within the past 15 years.  Fecal occult blood test (FOBT) of the stool. You may have this test every year starting at age 26.  Flexible sigmoidoscopy or colonoscopy. You may have a sigmoidoscopy every 5 years or a colonoscopy every 10 years starting at age 57.  Prostate cancer screening. Recommendations will vary depending on your family history and other risks.  Hepatitis C blood test.  Hepatitis B blood test.  Sexually transmitted disease (STD) testing.  Diabetes screening. This is done by checking your blood sugar (glucose) after you have not eaten for a while (fasting). You may have this done every 1-3 years. Discuss your test results, treatment options, and if necessary, the need for more tests with your health care provider. Vaccines  Your health care provider may recommend certain vaccines, such as:  Influenza vaccine. This is recommended every year.  Tetanus, diphtheria, and acellular pertussis (Tdap, Td) vaccine. You may need a Td booster every 10 years.  Zoster vaccine. You may  need this after age 35.  Pneumococcal 13-valent conjugate (PCV13) vaccine. You may need this if you have certain conditions and have not been vaccinated.  Pneumococcal polysaccharide (PPSV23) vaccine. You may need one or two doses if you smoke cigarettes  or if you have certain conditions. Talk to your health care provider about which screenings and vaccines you need and how often you need them. This information is not intended to replace advice given to you by your health care provider. Make sure you discuss any questions you have with your health care provider. Document Released: 10/05/2015 Document Revised: 05/28/2016 Document Reviewed: 07/10/2015 Elsevier Interactive Patient Education  2017 [place] Prevention in the Home Falls can cause injuries. They can happen to people of all ages. There are many things you can do to make your home safe and to help prevent falls. What can I do on the outside of my home?  Regularly fix the edges of walkways and driveways and fix any cracks.  Remove anything that might make you trip as you walk through a door, such as a raised step or threshold.  Trim any bushes or trees on the path to your home.  Use bright outdoor lighting.  Clear any walking paths of anything that might make someone trip, such as rocks or tools.  Regularly check to see if handrails are loose or broken. Make sure that both sides of any steps have handrails.  Any raised decks and porches should have guardrails on the edges.  Have any leaves, snow, or ice cleared regularly.  Use sand or salt on walking paths during winter.  Clean up any spills in your garage right away. This includes oil or grease spills. What can I do in the bathroom?  Use night lights.  Install grab bars by the toilet and in the tub and shower. Do not use towel bars as grab bars.  Use non-skid mats or decals in the tub or shower.  If you need to sit down in the shower, use a plastic, non-slip stool.  Keep the floor dry. Clean up any water that spills on the floor as soon as it happens.  Remove soap buildup in the tub or shower regularly.  Attach bath mats securely with double-sided non-slip rug tape.  Do not have throw rugs and other  things on the floor that can make you trip. What can I do in the bedroom?  Use night lights.  Make sure that you have a light by your bed that is easy to reach.  Do not use any sheets or blankets that are too big for your bed. They should not hang down onto the floor.  Have a firm chair that has side arms. You can use this for support while you get dressed.  Do not have throw rugs and other things on the floor that can make you trip. What can I do in the kitchen?  Clean up any spills right away.  Avoid walking on wet floors.  Keep items that you use a lot in easy-to-reach places.  If you need to reach something above you, use a strong step stool that has a grab bar.  Keep electrical cords out of the way.  Do not use floor polish or wax that makes floors slippery. If you must use wax, use non-skid floor wax.  Do not have throw rugs and other things on the floor that can make you trip. What can I do with my stairs?  Do  not leave any items on the stairs.  Make sure that there are handrails on both sides of the stairs and use them. Fix handrails that are broken or loose. Make sure that handrails are as long as the stairways.  Check any carpeting to make sure that it is firmly attached to the stairs. Fix any carpet that is loose or worn.  Avoid having throw rugs at the top or bottom of the stairs. If you do have throw rugs, attach them to the floor with carpet tape.  Make sure that you have a light switch at the top of the stairs and the bottom of the stairs. If you do not have them, ask someone to add them for you. What else can I do to help prevent falls?  Wear shoes that:  Do not have high heels.  Have rubber bottoms.  Are comfortable and fit you well.  Are closed at the toe. Do not wear sandals.  If you use a stepladder:  Make sure that it is fully opened. Do not climb a closed stepladder.  Make sure that both sides of the stepladder are locked into place.  Ask  someone to hold it for you, if possible.  Clearly mark and make sure that you can see:  Any grab bars or handrails.  First and last steps.  Where the edge of each step is.  Use tools that help you move around (mobility aids) if they are needed. These include:  Canes.  Walkers.  Scooters.  Crutches.  Turn on the lights when you go into a dark area. Replace any light bulbs as soon as they burn out.  Set up your furniture so you have a clear path. Avoid moving your furniture around.  If any of your floors are uneven, fix them.  If there are any pets around you, be aware of where they are.  Review your medicines with your doctor. Some medicines can make you feel dizzy. This can increase your chance of falling. Ask your doctor what other things that you can do to help prevent falls. This information is not intended to replace advice given to you by your health care provider. Make sure you discuss any questions you have with your health care provider. Document Released: 07/05/2009 Document Revised: 02/14/2016 Document Reviewed: 10/13/2014 Elsevier Interactive Patient Education  2017 [employer].

## 2017-02-18 NOTE — Progress Notes (Signed)
Subjective:   James Arnold is a 61 y.o. male who presents for an Initial Medicare Annual Wellness Visit.  Review of Systems  Cardiac Risk Factors include: male gender;advanced age (>37men, >55 women);hypertension;dyslipidemia;smoking/ tobacco exposure    Objective:    Today's Vitals   02/18/17 0807  BP: 124/68  Pulse: 62  Resp: 16  Weight: 171 lb 1.6 oz (77.6 kg)  Height: 5\' 7"  (1.702 m)   Body mass index is 26.8 kg/m.  Current Medications (verified) Outpatient Encounter Prescriptions as of 02/18/2017  Medication Sig  . aspirin 325 MG tablet Take 325 mg by mouth daily.  . citalopram (CELEXA) 20 MG tablet TAKE 1 TABLET(20 MG) BY MOUTH DAILY  . hydrochlorothiazide (HYDRODIURIL) 25 MG tablet Take 1 tablet (25 mg total) by mouth daily.  [name] Kitchen lisinopril (PRINIVIL,ZESTRIL) 20 MG tablet Take 1 tablet (20 mg total) by mouth daily.   No facility-administered encounter medications on file as of 02/18/2017.     Allergies (verified) Patient has no known allergies.   History: Past Medical History:  Diagnosis Date  . Anemia   . Benign hypertensive renal disease   . CAD (coronary artery disease) 08/1996   Stent placed  . Chronic kidney disease, stage II (mild)   . Depression   . Hyperlipidemia 10/2002  . Hypertension   . Myelofibrosis ([place]) 2010  . Smoker   . Thrombocytopenia ([place])    Past Surgical History:  Procedure Laterality Date  . ANGIOPLASTY    . BONE MARROW ASPIRATION  03/2009  . CORONARY STENT PLACEMENT  08/1996   Family History  Problem Relation Age of Onset  . Stroke Mother        Low BP stroke  . Depression Father   . Depression Sister        Breast  . Heart disease Unknown   . Breast cancer Unknown   . Lung cancer Unknown   . Ovarian cancer Unknown   . Stomach cancer Unknown    Social History   Occupational History  . Not on file.   Social History Main Topics  . Smoking status: Current Every Day Smoker    Packs/day: 1.00    Types: Cigarettes  .  Smokeless tobacco: Never Used  . Alcohol use 7.2 oz/week    12 Cans of beer per week  . Drug use: No  . Sexual activity: Not on file   Tobacco Counseling Ready to quit: No Counseling given: Yes   Activities of Daily Living In your present state of health, do you have any difficulty performing the following activities: 02/18/2017 06/18/2016  Hearing? N N  Vision? N N  Difficulty concentrating or making decisions? N N  Walking or climbing stairs? N N  Dressing or bathing? N N  Doing errands, shopping? N N  Preparing Food and eating ? N -  Using the Toilet? N -  In the past six months, have you accidently leaked urine? N -  Do you have problems with loss of bowel control? N -  Managing your Medications? N -  Managing your Finances? N -  Housekeeping or managing your Housekeeping? N -  Some recent data might be hidden    Immunizations and Health Maintenance Immunization History  Administered Date(s) Administered  . Pneumococcal Polysaccharide-23 07/26/2014  . Tdap 07/26/2014   There are no preventive care reminders to display for this patient.  Patient Care Team: Valerie Roys, DO as PCP - General (Family Medicine)  Indicate any recent Medical  Services you may have received from other than [facility] providers in the past year (date may be approximate).    Assessment:   This is a routine wellness examination for James Arnold.   Hearing/Vision screen Vision Screening Comments: Goes to [facility] annually  Dietary issues and exercise activities discussed: Current Exercise Habits: The patient does not participate in regular exercise at present  Goals    . Increase water intake          Recommend drinking at least 2-3 glasses of water a day.      Depression Screen PHQ 2/9 Scores 02/18/2017 06/18/2016 05/07/2015  PHQ - 2 Score 0 1 0  PHQ- 9 Score 1 - -    Fall Risk Fall Risk  02/18/2017 06/18/2016  Falls in the past year? No No    Cognitive Function:     6CIT Screen  02/18/2017  What Year? 0 points  What month? 0 points  What time? 0 points  Count back from 20 0 points  Months in reverse 0 points  Repeat phrase 2 points  Total Score 2    Screening Tests Health Maintenance  Topic Date Due  . INFLUENZA VACCINE  04/22/2017  . COLONOSCOPY  05/23/2018  . TETANUS/TDAP  07/26/2024  . Hepatitis C Screening  Addressed  . HIV Screening  Addressed        Plan:    I have personally reviewed and addressed the Medicare Annual Wellness questionnaire and have noted the following in the patient's chart:  A. Medical and social history B. Use of alcohol, tobacco or illicit drugs  C. Current medications and supplements D. Functional ability and status E.  Nutritional status F.  Physical activity G. Advance directives H. List of other physicians I.  Hospitalizations, surgeries, and ER visits in previous 12 months J.  [place] such as hearing and vision if needed, cognitive and depression L. Referrals and appointments - 03/05/2017 at 10:00am with [facility].  In addition, I have reviewed and discussed with patient certain preventive protocols, quality metrics, and best practice recommendations. A written personalized care plan for preventive services as well as general preventive health recommendations were provided to patient.   Signed,  Tyler Aas, LPN Nurse Health Advisor   MD Recommendations: none

## 2017-03-05 ENCOUNTER — Encounter: Payer: Self-pay | Admitting: Family Medicine

## 2017-03-05 ENCOUNTER — Ambulatory Visit (INDEPENDENT_AMBULATORY_CARE_PROVIDER_SITE_OTHER): Payer: Medicare HMO | Admitting: Family Medicine

## 2017-03-05 VITALS — BP 137/86 | HR 75 | Temp 98.6°F | Ht 65.3 in | Wt 172.5 lb

## 2017-03-05 DIAGNOSIS — R42 Dizziness and giddiness: Secondary | ICD-10-CM | POA: Diagnosis not present

## 2017-03-05 DIAGNOSIS — E782 Mixed hyperlipidemia: Secondary | ICD-10-CM

## 2017-03-05 DIAGNOSIS — D7581 Myelofibrosis: Secondary | ICD-10-CM | POA: Diagnosis not present

## 2017-03-05 DIAGNOSIS — F339 Major depressive disorder, recurrent, unspecified: Secondary | ICD-10-CM

## 2017-03-05 DIAGNOSIS — I2510 Atherosclerotic heart disease of native coronary artery without angina pectoris: Secondary | ICD-10-CM | POA: Diagnosis not present

## 2017-03-05 DIAGNOSIS — I129 Hypertensive chronic kidney disease with stage 1 through stage 4 chronic kidney disease, or unspecified chronic kidney disease: Secondary | ICD-10-CM | POA: Diagnosis not present

## 2017-03-05 DIAGNOSIS — N182 Chronic kidney disease, stage 2 (mild): Secondary | ICD-10-CM

## 2017-03-05 DIAGNOSIS — Z0000 Encounter for general adult medical examination without abnormal findings: Secondary | ICD-10-CM

## 2017-03-05 DIAGNOSIS — Z125 Encounter for screening for malignant neoplasm of prostate: Secondary | ICD-10-CM

## 2017-03-05 LAB — UA/M W/RFLX CULTURE, ROUTINE
BILIRUBIN UA: NEGATIVE
Glucose, UA: NEGATIVE
KETONES UA: NEGATIVE
LEUKOCYTES UA: NEGATIVE
Nitrite, UA: NEGATIVE
RBC UA: NEGATIVE
Urobilinogen, Ur: 0.2 mg/dL (ref 0.2–1.0)
pH, UA: 5 (ref 5.0–7.5)

## 2017-03-05 LAB — MICROALBUMIN, URINE WAIVED
Creatinine, Urine Waived: 50 mg/dL (ref 10–300)
MICROALB, UR WAIVED: 150 mg/L — AB (ref 0–19)
Microalb/Creat Ratio: >300 mg/g — ABNORMAL HIGH (ref <30)

## 2017-03-05 LAB — MICROSCOPIC EXAMINATION
Bacteria, UA: NONE SEEN
RBC, UA: NONE SEEN /hpf (ref 0–?)

## 2017-03-05 MED ORDER — CITALOPRAM HYDROBROMIDE 20 MG PO TABS: ORAL_TABLET | ORAL | 1 refills | Status: DC

## 2017-03-05 MED ORDER — HYDROCHLOROTHIAZIDE 25 MG PO TABS: 25.0000 mg | ORAL_TABLET | Freq: Every day | ORAL | 1 refills | Status: DC

## 2017-03-05 MED ORDER — LISINOPRIL 20 MG PO TABS: 20.0000 mg | ORAL_TABLET | Freq: Every day | ORAL | 1 refills | Status: DC

## 2017-03-05 NOTE — Assessment & Plan Note (Signed)
Rechecking labs today. Await results.  

## 2017-03-05 NOTE — Assessment & Plan Note (Signed)
Under good control. Continue current regimen. Continue to monitor. Call with any concerns. 

## 2017-03-05 NOTE — Assessment & Plan Note (Signed)
Continue to follow with hematology. Call with any concerns. Labs rechecked today.

## 2017-03-05 NOTE — Assessment & Plan Note (Signed)
Stable. Continue to follow with cardiology. Continue to keep BP and cholesterol under good control. Call with any concerns.

## 2017-03-05 NOTE — Assessment & Plan Note (Signed)
Rechecking levels today. Await results. Treat as needed.  

## 2017-03-05 NOTE — Patient Instructions (Addendum)

## 2017-03-05 NOTE — Progress Notes (Signed)
BP 137/86   Pulse 75   Temp 98.6 F (37 C)   Ht 5' 5.3" (1.659 m)   Wt 172 lb 8 oz (78.2 kg)   SpO2 98%   BMI 28.44 kg/m    Subjective:    [name], male    DOB: August 15, 1956, 61 y.o.   MRN: [id number]  HPI: James Arnold is a 61 y.o. male presenting on 03/05/2017 for comprehensive medical examination. Current medical complaints include:  DIZZINESS Duration: chronic Description of symptoms: off kilter Duration of episode: maybe a minute Dizziness frequency: 4-5+ times a week Provoking factors: at random, usually when walking or getting up Aggravating factors:  Usually walking or getting up Triggered by rolling over in bed: no Triggered by bending over: yes Aggravated by head movement: no Aggravated by exertion, coughing, loud noises: no Recent head injury: no Recent or current viral symptoms: no History of vasovagal episodes: no Nausea: no Vomiting: no Tinnitus: no Hearing loss: no Aural fullness: no Headache: yes- occasionally Photophobia/phonophobia: no Unsteady gait: no Postural instability: no Diplopia, dysarthria, dysphagia or weakness: no Related to exertion: no Pallor: no Diaphoresis: no Dyspnea: yes Chest pain: yes  HYPERTENSION / HYPERLIPIDEMIA Satisfied with current treatment? yes Duration of hypertension: chronic BP monitoring frequency: not checking BP medication side effects: no Past BP meds: lisinopril- hctz Duration of hyperlipidemia: chronic Cholesterol medication side effects: Not on anything Cholesterol supplements: none Past cholesterol medications: none Medication compliance: excellent compliance Aspirin: yes Recent stressors: no Recurrent headaches: no Visual changes: no Palpitations: no Dyspnea: yes Chest pain: yes Lower extremity edema: no Dizzy/lightheaded: yes  DEPRESSION Mood status: controlled Satisfied with current treatment?: yes Symptom severity: mild  Duration of current treatment : chronic Side  effects: no Medication compliance: excellent compliance Psychotherapy/counseling: no  Previous psychiatric medications: celexa Depressed mood: no Anxious mood: no Anhedonia: no Significant weight loss or gain: no Insomnia: no  Fatigue: no Feelings of worthlessness or guilt: no Impaired concentration/indecisiveness: no Suicidal ideations: no Hopelessness: no Crying spells: no Depression screen [facility] 2/9 03/05/2017 02/18/2017 06/18/2016 05/07/2015  Decreased Interest 0 0 1 0  Down, Depressed, Hopeless 0 0 0 0  PHQ - 2 Score 0 0 1 0  Altered sleeping - 0 - -  Tired, decreased energy - 1 - -  Change in appetite - 0 - -  Feeling bad or failure about yourself  - 0 - -  Trouble concentrating - 0 - -  Moving slowly or fidgety/restless - 0 - -  Suicidal thoughts - 0 - -  PHQ-9 Score - 1 - -    He currently lives with: wife Interim Problems from his last visit: no  Depression Screen done today and results listed below:  Depression screen [facility] 2/9 03/05/2017 02/18/2017 06/18/2016 05/07/2015  Decreased Interest 0 0 1 0  Down, Depressed, Hopeless 0 0 0 0  PHQ - 2 Score 0 0 1 0  Altered sleeping - 0 - -  Tired, decreased energy - 1 - -  Change in appetite - 0 - -  Feeling bad or failure about yourself  - 0 - -  Trouble concentrating - 0 - -  Moving slowly or fidgety/restless - 0 - -  Suicidal thoughts - 0 - -  PHQ-9 Score - 1 - -   Past Medical History:  Past Medical History:  Diagnosis Date  . Anemia   . Benign hypertensive renal disease   . CAD (coronary artery disease) 08/1996  Stent placed  . Chronic kidney disease, stage II (mild)   . Depression   . Hyperlipidemia 10/2002  . Hypertension   . 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 Smoker   . Thrombocytopenia [facility])     Surgical History:  Past Surgical History:  Procedure Laterality Date  . ANGIOPLASTY    . BONE MARROW ASPIRATION  03/2009  . CORONARY STENT PLACEMENT  08/1996    Medications:  Current Outpatient Prescriptions on  File Prior to Visit  Medication Sig  . aspirin 325 MG tablet Take 325 mg by mouth daily.  . citalopram (CELEXA) 20 MG tablet TAKE 1 TABLET(20 MG) BY MOUTH DAILY  . hydrochlorothiazide (HYDRODIURIL) 25 MG tablet Take 1 tablet (25 mg total) by mouth daily.  [name] Kitchen lisinopril (PRINIVIL,ZESTRIL) 20 MG tablet Take 1 tablet (20 mg total) by mouth daily.   No current facility-administered medications on file prior to visit.     Allergies:  No Known Allergies  Social History:  Social History   Social History  . Marital status: Married    Spouse name: N/A  . Number of children: N/A  . Years of education: N/A   Occupational History  . Not on file.   Social History Main Topics  . Smoking status: Current Every Day Smoker    Packs/day: 1.00    Types: Cigarettes  . Smokeless tobacco: Never Used  . Alcohol use 7.2 oz/week    12 Cans of beer per week  . Drug use: No  . Sexual activity: Yes   Other Topics Concern  . Not on file   Social History Narrative  . No narrative on file   History  Smoking Status  . Current Every Day Smoker  . Packs/day: 1.00  . Types: Cigarettes  Smokeless Tobacco  . Never Used   History  Alcohol Use  . 7.2 oz/week  . 12 Cans of beer per week    Family History:  Family History  Problem Relation Age of Onset  . Stroke Mother        Low BP stroke  . Depression Father   . Depression Sister        Breast  . Heart disease Unknown   . Breast cancer Unknown   . Lung cancer Unknown   . Ovarian cancer Unknown   . Stomach cancer Unknown     Past medical history, surgical history, medications, allergies, family history and social history reviewed with patient today and changes made to appropriate areas of the chart.   Review of Systems  Constitutional: Negative.   HENT: Negative.   Eyes: Positive for blurred vision. Negative for double vision, photophobia, pain, discharge and redness.  Respiratory: Positive for shortness of breath. Negative for  cough, hemoptysis, sputum production and wheezing.   Cardiovascular: Positive for chest pain. Negative for palpitations, orthopnea, claudication, leg swelling and PND.  Gastrointestinal: Positive for heartburn (usually with dietary indiscretion- usually a couple of times a week, doesn't want any medicine) and vomiting. Negative for abdominal pain, blood in stool, constipation, diarrhea, melena and nausea.  Genitourinary: Negative.   Musculoskeletal: Negative.   Skin: Negative.   Neurological: Positive for dizziness. Negative for tingling, tremors, sensory change, speech change, focal weakness, seizures, loss of consciousness and headaches.  Endo/Heme/Allergies: Positive for polydipsia. Negative for environmental allergies. Bruises/bleeds easily.  Psychiatric/Behavioral: Negative.     All other ROS negative except what is listed above and in the HPI.      Objective:    BP 137/86  Pulse 75   Temp 98.6 F (37 C)   Ht 5' 5.3" (1.659 m)   Wt 172 lb 8 oz (78.2 kg)   SpO2 98%   BMI 28.44 kg/m   Wt Readings from Last 3 Encounters:  03/05/17 172 lb 8 oz (78.2 kg)  02/18/17 171 lb 1.6 oz (77.6 kg)  11/14/16 173 lb 6 oz (78.6 kg)     Hearing Screening   125Hz  250Hz  500Hz  1000Hz  2000Hz  3000Hz  4000Hz  6000Hz  8000Hz   Right ear:   20 20 20  20     Left ear:   Fail 20 20  20       Visual Acuity Screening   Right eye Left eye Both eyes  Without correction:  20/20 20/20  With correction:       Physical Exam  Constitutional: He is oriented to person, place, and time. He appears well-developed and well-nourished. No distress.  HENT:  Head: Normocephalic and atraumatic.  Right Ear: Hearing, tympanic membrane, external ear and ear canal normal.  Left Ear: Hearing, tympanic membrane, external ear and ear canal normal.  Nose: Nose normal.  Mouth/Throat: Uvula is midline, oropharynx is clear and moist and mucous membranes are normal. No oropharyngeal exudate.  Eyes: Conjunctivae, EOM and lids  are normal. Pupils are equal, round, and reactive to light. Right eye exhibits no discharge. Left eye exhibits no discharge. No scleral icterus.  Neck: Normal range of motion. Neck supple. No JVD present. No tracheal deviation present. No thyromegaly present.  Cardiovascular: Normal rate, regular rhythm, normal heart sounds and intact distal pulses.  Exam reveals no gallop and no friction rub.   No murmur heard. Pulmonary/Chest: Effort normal and breath sounds normal. No stridor. No respiratory distress. He has no wheezes. He has no rales. He exhibits no tenderness.  Abdominal: Soft. Bowel sounds are normal. He exhibits no distension and no mass. There is no tenderness. There is no rebound and no guarding. Hernia confirmed negative in the right inguinal area and confirmed negative in the left inguinal area.  Genitourinary: Testes normal and penis normal. Rectal exam shows no external hemorrhoid, no internal hemorrhoid, no fissure, no mass, no tenderness, anal tone normal and guaiac negative stool. Prostate is enlarged. Prostate is not tender. Cremasteric reflex is present. Right testis shows no mass, no swelling and no tenderness. Right testis is descended. Cremasteric reflex is not absent on the right side. Left testis shows no mass, no swelling and no tenderness. Left testis is descended. Cremasteric reflex is not absent on the left side. Circumcised. No phimosis, paraphimosis, hypospadias, penile erythema or penile tenderness. No discharge found.  Musculoskeletal: Normal range of motion. He exhibits no edema, tenderness or deformity.  Lymphadenopathy:    He has no cervical adenopathy.  Neurological: He is alert and oriented to person, place, and time. He has normal reflexes. He displays normal reflexes. No cranial nerve deficit. He exhibits normal muscle tone. Coordination normal.  Skin: Skin is warm, dry and intact. No rash noted. He is not diaphoretic. No erythema. No pallor.  Psychiatric: He has a  normal mood and affect. His speech is normal and behavior is normal. Judgment and thought content normal. Cognition and memory are normal.  Nursing note and vitals reviewed.   Results for orders placed or performed in visit on 03/05/17  Microscopic Examination  Result Value Ref Range   WBC, UA 0-5 0 - 5 /hpf   RBC, UA None seen 0 - 2 /hpf   Epithelial Cells (non renal) 0-10  0 - 10 /hpf   Bacteria, UA None seen None seen/Few  Microalbumin, Urine Waived  Result Value Ref Range   Microalb, Ur Waived 150 (H) 0 - 19 mg/L   Creatinine, Urine Waived 50 10 - 300 mg/dL   Microalb/Creat Ratio >300 (H) <30 mg/g  UA/M w/rflx Culture, Routine  Result Value Ref Range   Specific Gravity, UA <1.005 (L) 1.005 - 1.030   pH, UA 5.0 5.0 - 7.5   Color, UA Yellow Yellow   Appearance Ur Clear Clear   Leukocytes, UA Negative Negative   Protein, UA 1+ (A) Negative/Trace   Glucose, UA Negative Negative   Ketones, UA Negative Negative   RBC, UA Negative Negative   Bilirubin, UA Negative Negative   Urobilinogen, Ur 0.2 0.2 - 1.0 mg/dL   Nitrite, UA Negative Negative   Microscopic Examination See below:       Assessment & Plan:   Problem List Items Addressed This Visit      Cardiovascular and Mediastinum   CAD (coronary artery disease)    Stable. Continue to follow with cardiology. Continue to keep BP and cholesterol under good control. Call with any concerns.       Relevant Orders   Comprehensive metabolic panel   TSH   UA/M w/rflx Culture, Routine (Completed)     Genitourinary   Benign hypertensive renal disease    Under good control. Continue current regimen. Continue to monitor. Call with any concerns.       Relevant Orders   Comprehensive metabolic panel   Microalbumin, Urine Waived (Completed)   TSH   UA/M w/rflx Culture, Routine (Completed)   Chronic kidney disease, stage II (mild)    Rechecking labs today. Await results.       Relevant Orders   Comprehensive metabolic panel     TSH   UA/M w/rflx Culture, Routine (Completed)     Other   Myelofibrosis ([place])    Continue to follow with hematology. Call with any concerns. Labs rechecked today.      Relevant Orders   CBC with Differential/Platelet   Comprehensive metabolic panel   TSH   UA/M w/rflx Culture, Routine (Completed)   Hyperlipidemia    Rechecking levels today. Await results. Treat as needed.       Relevant Orders   Comprehensive metabolic panel   Lipid Panel w/o Chol/HDL Ratio   TSH   UA/M w/rflx Culture, Routine (Completed)   Depression    Under good control. Continue current regimen. Continue to monitor. Call with any concerns.       Relevant Orders   Comprehensive metabolic panel   TSH   UA/M w/rflx Culture, Routine (Completed)    Other Visit Diagnoses    Routine general medical examination at a health care facility    -  Primary   Vaccines up to date. Screening labs checked today. Colonoscopy up to date. Continue diet and exercise. Call with any concerns.    Screening for prostate cancer       Labs drawn today. Await results.    Relevant Orders   PSA   Dizziness       Will check labs. Increase fluid intake. Call if not getting better or getting worse.        Discussed aspirin prophylaxis for myocardial infarction prevention and decision was made to continue ASA  LABORATORY TESTING:  Health maintenance labs ordered today as discussed above.   The natural history of prostate cancer and ongoing controversy regarding screening and  potential treatment outcomes of prostate cancer has been discussed with the patient. The meaning of a false positive PSA and a false negative PSA has been discussed. He indicates understanding of the limitations of this screening test and wishes to proceed with screening PSA testing.   IMMUNIZATIONS:   - Tdap: Tetanus vaccination status reviewed: last tetanus booster within 10 years. - Influenza: Postponed to flu season - Pneumovax: Up to date -  Prevnar: Not applicable - Zostavax vaccine: Will check on his insurance  SCREENING: - Colonoscopy: Up to date  Discussed with patient purpose of the colonoscopy is to detect colon cancer at curable precancerous or early stages   PATIENT COUNSELING:    Sexuality: Discussed sexually transmitted diseases, partner selection, use of condoms, avoidance of unintended pregnancy  and contraceptive alternatives.   Advised to avoid cigarette smoking.  I discussed with the patient that most people either abstain from alcohol or drink within safe limits (<=14/week and <=4 drinks/occasion for males, <=7/weeks and <= 3 drinks/occasion for females) and that the risk for alcohol disorders and other health effects rises proportionally with the number of drinks per week and how often a drinker exceeds daily limits.  Discussed cessation/primary prevention of drug use and availability of treatment for abuse.   Diet: Encouraged to adjust caloric intake to maintain  or achieve ideal body weight, to reduce intake of dietary saturated fat and total fat, to limit sodium intake by avoiding high sodium foods and not adding table salt, and to maintain adequate dietary potassium and calcium preferably from fresh fruits, vegetables, and low-fat dairy products.    stressed the importance of regular exercise  Injury prevention: Discussed safety belts, safety helmets, smoke detector, smoking near bedding or upholstery.   Dental health: Discussed importance of regular tooth brushing, flossing, and dental visits.   Follow up plan: NEXT PREVENTATIVE PHYSICAL DUE IN 1 YEAR. Return in about 6 months (around 09/04/2017).

## 2017-03-06 ENCOUNTER — Encounter: Payer: Self-pay | Admitting: Family Medicine

## 2017-03-06 LAB — CBC WITH DIFFERENTIAL/PLATELET
BASOS ABS: 0 10*3/uL (ref 0.0–0.2)
Basos: 0 %
EOS (ABSOLUTE): 0 10*3/uL (ref 0.0–0.4)
Eos: 0 %
Hematocrit: 34 % — ABNORMAL LOW (ref 37.5–51.0)
Hemoglobin: 11 g/dL — ABNORMAL LOW (ref 13.0–17.7)
IMMATURE GRANS (ABS): 0 10*3/uL (ref 0.0–0.1)
IMMATURE GRANULOCYTES: 1 %
LYMPHS: 20 %
Lymphocytes Absolute: 0.6 10*3/uL — ABNORMAL LOW (ref 0.7–3.1)
MCH: 28.1 pg (ref 26.6–33.0)
MCHC: 32.4 g/dL (ref 31.5–35.7)
MCV: 87 fL (ref 79–97)
MONOCYTES: 3 %
Monocytes Absolute: 0.1 10*3/uL (ref 0.1–0.9)
NEUTROS PCT: 76 %
Neutrophils Absolute: 2.4 10*3/uL (ref 1.4–7.0)
PLATELETS: 91 10*3/uL — AB (ref 150–379)
RBC: 3.91 x10E6/uL — ABNORMAL LOW (ref 4.14–5.80)
RDW: 20.3 % — AB (ref 12.3–15.4)
WBC: 3.2 10*3/uL — ABNORMAL LOW (ref 3.4–10.8)

## 2017-03-06 LAB — COMPREHENSIVE METABOLIC PANEL
ALT: 12 IU/L (ref 0–44)
AST: 17 IU/L (ref 0–40)
Albumin/Globulin Ratio: 2.1 (ref 1.2–2.2)
Albumin: 4.4 g/dL (ref 3.6–4.8)
Alkaline Phosphatase: 58 IU/L (ref 39–117)
BUN/Creatinine Ratio: 18 (ref 10–24)
BUN: 30 mg/dL — AB (ref 8–27)
Bilirubin Total: 0.6 mg/dL (ref 0.0–1.2)
CALCIUM: 8.8 mg/dL (ref 8.6–10.2)
CO2: 20 mmol/L (ref 20–29)
CREATININE: 1.71 mg/dL — AB (ref 0.76–1.27)
Chloride: 103 mmol/L (ref 96–106)
GFR calc Af Amer: 49 mL/min/{1.73_m2} — ABNORMAL LOW (ref >59)
GFR, EST NON AFRICAN AMERICAN: 43 mL/min/{1.73_m2} — AB (ref >59)
GLUCOSE: 83 mg/dL (ref 65–99)
Globulin, Total: 2.1 g/dL (ref 1.5–4.5)
Potassium: 4.8 mmol/L (ref 3.5–5.2)
Sodium: 136 mmol/L (ref 134–144)
Total Protein: 6.5 g/dL (ref 6.0–8.5)

## 2017-03-06 LAB — LIPID PANEL W/O CHOL/HDL RATIO
Cholesterol, Total: 144 mg/dL (ref 100–199)
HDL: 34 mg/dL — AB (ref >39)
LDL Calculated: 92 mg/dL (ref 0–99)
TRIGLYCERIDES: 91 mg/dL (ref 0–149)
VLDL Cholesterol Cal: 18 mg/dL (ref 5–40)

## 2017-03-06 LAB — PSA: Prostate Specific Ag, Serum: 0.4 ng/mL (ref 0.0–4.0)

## 2017-03-06 LAB — TSH: TSH: 1.43 u[IU]/mL (ref 0.450–4.500)

## 2017-03-17 DIAGNOSIS — H5213 Myopia, bilateral: Secondary | ICD-10-CM | POA: Diagnosis not present

## 2017-04-02 DIAGNOSIS — Z01818 Encounter for other preprocedural examination: Secondary | ICD-10-CM | POA: Diagnosis not present

## 2017-04-02 DIAGNOSIS — H25811 Combined forms of age-related cataract, right eye: Secondary | ICD-10-CM | POA: Diagnosis not present

## 2017-04-09 DIAGNOSIS — H25811 Combined forms of age-related cataract, right eye: Secondary | ICD-10-CM | POA: Diagnosis not present

## 2017-04-09 DIAGNOSIS — H2511 Age-related nuclear cataract, right eye: Secondary | ICD-10-CM | POA: Diagnosis not present

## 2017-05-13 ENCOUNTER — Ambulatory Visit
Admission: RE | Admit: 2017-05-13 | Discharge: 2017-05-13 | Disposition: A | Discharge status: Home / Self Care | Payer: Medicare HMO | Source: Ambulatory Visit | Attending: Oncology | Admitting: Oncology

## 2017-05-13 DIAGNOSIS — D7581 Myelofibrosis: Secondary | ICD-10-CM | POA: Diagnosis not present

## 2017-05-13 DIAGNOSIS — R161 Splenomegaly, not elsewhere classified: Secondary | ICD-10-CM | POA: Diagnosis not present

## 2017-05-15 ENCOUNTER — Ambulatory Visit
Admission: RE | Admit: 2017-05-15 | Discharge: 2017-05-15 | Disposition: A | Discharge status: Home / Self Care | Payer: Medicare HMO | Source: Ambulatory Visit | Attending: Internal Medicine | Admitting: Internal Medicine

## 2017-05-15 ENCOUNTER — Inpatient Hospital Stay: Payer: Medicare HMO | Attending: Internal Medicine

## 2017-05-15 ENCOUNTER — Inpatient Hospital Stay (HOSPITAL_BASED_OUTPATIENT_CLINIC_OR_DEPARTMENT_OTHER): Payer: Medicare HMO | Admitting: Internal Medicine

## 2017-05-15 VITALS — BP 114/69 | HR 84 | Temp 97.4°F | Resp 16 | Wt 174.8 lb

## 2017-05-15 DIAGNOSIS — R918 Other nonspecific abnormal finding of lung field: Secondary | ICD-10-CM | POA: Diagnosis not present

## 2017-05-15 DIAGNOSIS — D696 Thrombocytopenia, unspecified: Secondary | ICD-10-CM

## 2017-05-15 DIAGNOSIS — I2510 Atherosclerotic heart disease of native coronary artery without angina pectoris: Secondary | ICD-10-CM | POA: Diagnosis not present

## 2017-05-15 DIAGNOSIS — F329 Major depressive disorder, single episode, unspecified: Secondary | ICD-10-CM | POA: Diagnosis not present

## 2017-05-15 DIAGNOSIS — N182 Chronic kidney disease, stage 2 (mild): Secondary | ICD-10-CM | POA: Diagnosis not present

## 2017-05-15 DIAGNOSIS — R93422 Abnormal radiologic findings on diagnostic imaging of left kidney: Secondary | ICD-10-CM | POA: Diagnosis not present

## 2017-05-15 DIAGNOSIS — D7581 Myelofibrosis: Secondary | ICD-10-CM | POA: Diagnosis not present

## 2017-05-15 DIAGNOSIS — N179 Acute kidney failure, unspecified: Secondary | ICD-10-CM | POA: Diagnosis not present

## 2017-05-15 DIAGNOSIS — Z7982 Long term (current) use of aspirin: Secondary | ICD-10-CM

## 2017-05-15 DIAGNOSIS — D471 Chronic myeloproliferative disease: Secondary | ICD-10-CM

## 2017-05-15 DIAGNOSIS — R93421 Abnormal radiologic findings on diagnostic imaging of right kidney: Secondary | ICD-10-CM | POA: Diagnosis not present

## 2017-05-15 DIAGNOSIS — D649 Anemia, unspecified: Secondary | ICD-10-CM

## 2017-05-15 DIAGNOSIS — F17210 Nicotine dependence, cigarettes, uncomplicated: Secondary | ICD-10-CM

## 2017-05-15 DIAGNOSIS — R161 Splenomegaly, not elsewhere classified: Secondary | ICD-10-CM

## 2017-05-15 DIAGNOSIS — Z79899 Other long term (current) drug therapy: Secondary | ICD-10-CM | POA: Insufficient documentation

## 2017-05-15 DIAGNOSIS — I129 Hypertensive chronic kidney disease with stage 1 through stage 4 chronic kidney disease, or unspecified chronic kidney disease: Secondary | ICD-10-CM | POA: Insufficient documentation

## 2017-05-15 DIAGNOSIS — E785 Hyperlipidemia, unspecified: Secondary | ICD-10-CM | POA: Insufficient documentation

## 2017-05-15 LAB — CBC WITH DIFFERENTIAL/PLATELET
Basophils Absolute: 0 10*3/uL (ref 0–0.1)
Basophils Relative: 0 %
EOS PCT: 0 %
Eosinophils Absolute: 0 10*3/uL (ref 0–0.7)
HCT: 32.2 % — ABNORMAL LOW (ref 40.0–52.0)
Hemoglobin: 11.1 g/dL — ABNORMAL LOW (ref 13.0–18.0)
LYMPHS ABS: 0.7 10*3/uL — AB (ref 1.0–3.6)
LYMPHS PCT: 19 %
MCH: 29.6 pg (ref 26.0–34.0)
MCHC: 34.4 g/dL (ref 32.0–36.0)
MCV: 86 fL (ref 80.0–100.0)
MONO ABS: 0.1 10*3/uL — AB (ref 0.2–1.0)
Monocytes Relative: 4 %
Neutro Abs: 2.9 10*3/uL (ref 1.4–6.5)
Neutrophils Relative %: 77 %
PLATELETS: 138 10*3/uL — AB (ref 150–440)
RBC: 3.74 MIL/uL — ABNORMAL LOW (ref 4.40–5.90)
RDW: 18.3 % — AB (ref 11.5–14.5)
WBC: 3.7 10*3/uL — ABNORMAL LOW (ref 3.8–10.6)

## 2017-05-15 LAB — IRON AND TIBC
IRON: 105 ug/dL (ref 45–182)
Saturation Ratios: 36 % (ref 17.9–39.5)
TIBC: 293 ug/dL (ref 250–450)
UIBC: 188 ug/dL

## 2017-05-15 LAB — HEPATIC FUNCTION PANEL
ALT: 13 U/L — ABNORMAL LOW (ref 17–63)
AST: 16 U/L (ref 15–41)
Albumin: 4.2 g/dL (ref 3.5–5.0)
Alkaline Phosphatase: 56 U/L (ref 38–126)
TOTAL PROTEIN: 7 g/dL (ref 6.5–8.1)
Total Bilirubin: 0.6 mg/dL (ref 0.3–1.2)

## 2017-05-15 LAB — CREATININE, SERUM
Creatinine, Ser: 2.17 mg/dL — ABNORMAL HIGH (ref 0.61–1.24)
GFR calc Af Amer: 36 mL/min — ABNORMAL LOW (ref >60)
GFR calc non Af Amer: 31 mL/min — ABNORMAL LOW (ref >60)

## 2017-05-15 LAB — FERRITIN: FERRITIN: 117 ng/mL (ref 24–336)

## 2017-05-15 LAB — LACTATE DEHYDROGENASE: LDH: 509 U/L — ABNORMAL HIGH (ref 98–192)

## 2017-05-15 NOTE — Progress Notes (Signed)
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   # hx of Lung nodules- resolved [[name]]/quit smoking.    No history exists.     INTERVAL HISTORY:  A very pleasant 61 year old male patient with above history of primary myelofibrosis diagnosed in 2010 is here for follow-up. His appetite is good. Denies any early satiety or significant weight loss.  Patient denies any nausea vomiting. Denies any significant fatigue. Denies any difficulty in urination. He states his blood pressure is better controlled/is currently started on new antihypertensive agents. Denies any lumps or bumps. No fevers.  Not significantly worse.  REVIEW OF SYSTEMS:  A complete 10 point review of system is done which is negative except mentioned above/history of present illness.   PAST MEDICAL HISTORY :  Past Medical History:  Diagnosis Date  . Anemia   . Benign hypertensive renal disease   . CAD (coronary artery disease) 08/1996   Stent placed  . Chronic kidney disease, stage II (mild)   . Depression   . Hyperlipidemia 10/2002  . Hypertension   . 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 Smoker   . Thrombocytopenia ([place])     PAST SURGICAL HISTORY :   Past Surgical History:  Procedure Laterality Date  . ANGIOPLASTY    . BONE MARROW ASPIRATION  03/2009  . CORONARY STENT PLACEMENT  08/1996    FAMILY HISTORY :   Family History  Problem Relation Age of Onset  . Stroke Mother        Low BP stroke  . Depression Father   . Depression Sister        Breast  . Heart disease Unknown   . Breast cancer Unknown   . Lung  cancer Unknown   . Ovarian cancer Unknown   . Stomach cancer Unknown     SOCIAL HISTORY:   Social History  Substance Use Topics  . Smoking status: Current Every Day Smoker    Packs/day: 1.00    Types: Cigarettes  . Smokeless tobacco: Never Used  . Alcohol use 7.2 oz/week    12 Cans of beer per week    ALLERGIES:  has No Known Allergies.  MEDICATIONS:  Current Outpatient Prescriptions  Medication Sig Dispense Refill  . aspirin 325 MG tablet Take 325 mg by mouth daily.    . citalopram (CELEXA) 20 MG tablet TAKE 1 TABLET(20 MG) BY MOUTH DAILY 90 tablet 1  . hydrochlorothiazide (HYDRODIURIL) 25 MG tablet Take 1 tablet (25 mg total) by mouth daily. 90 tablet 1  . lisinopril (PRINIVIL,ZESTRIL) 20 MG tablet Take 1 tablet (20 mg total) by mouth daily. 90 tablet 1   No current facility-administered medications for this visit.     PHYSICAL EXAMINATION: ECOG PERFORMANCE STATUS: 0 - Asymptomatic  BP 114/69 (BP Location: Left Arm, Patient Position: Sitting)   Pulse 84   Temp (!) 97.4 F (36.3 C) (Tympanic)   Resp 16   Wt 174 lb 12.8 oz (79.3 kg)   BMI 28.82 kg/m   Filed Weights   05/15/17 1203  Weight: 174 lb 12.8 oz (79.3 kg)    GENERAL: Well-nourished well-developed; Alert, no distress  and comfortable.    Accompanied by his wife. EYES: no pallor or icterus OROPHARYNX: no thrush or ulceration; good dentition  NECK: supple, no masses felt LYMPH:  no palpable lymphadenopathy in the cervical, axillary or inguinal regions LUNGS: clear to auscultation and  No wheeze or crackles HEART/CVS: regular rate & rhythm and no murmurs; No lower extremity edema;  Positive for splenomegaly.  ABDOMEN:abdomen soft, non-tender and normal bowel sounds Musculoskeletal:no cyanosis of digits and no clubbing  PSYCH: alert & oriented x 3 with fluent speech NEURO: no focal motor/sensory deficits SKIN:  no rashes or significant lesions  LABORATORY DATA:  I have reviewed the data as listed     Component Value Date/Time   NA 136 03/05/2017 1013   K 4.8 03/05/2017 1013   CL 103 03/05/2017 1013   CO2 20 03/05/2017 1013   GLUCOSE 83 03/05/2017 1013   GLUCOSE 90 11/14/2016 0910   BUN 30 (H) 03/05/2017 1013   CREATININE 2.17 (H) 05/15/2017 1134   CREATININE 1.12 09/30/2013 0953   CALCIUM 8.8 03/05/2017 1013   PROT 7.0 05/15/2017 1306   PROT 6.5 03/05/2017 1013   ALBUMIN 4.2 05/15/2017 1306   ALBUMIN 4.4 03/05/2017 1013   AST 16 05/15/2017 1306   ALT 13 (L) 05/15/2017 1306   ALKPHOS 56 05/15/2017 1306   BILITOT 0.6 05/15/2017 1306   BILITOT 0.6 03/05/2017 1013   GFRNONAA 31 (L) 05/15/2017 1134   GFRNONAA >60 09/30/2013 0953   GFRAA 36 (L) 05/15/2017 1134   GFRAA >60 09/30/2013 0953    No results found for: SPEP, UPEP  Lab Results  Component Value Date   WBC 3.7 (L) 05/15/2017   NEUTROABS 2.9 05/15/2017   HGB 11.1 (L) 05/15/2017   HCT 32.2 (L) 05/15/2017   MCV 86.0 05/15/2017   PLT 138 (L) 05/15/2017      Chemistry      Component Value Date/Time   NA 136 03/05/2017 1013   K 4.8 03/05/2017 1013   CL 103 03/05/2017 1013   CO2 20 03/05/2017 1013   BUN 30 (H) 03/05/2017 1013   CREATININE 2.17 (H) 05/15/2017 1134   CREATININE 1.12 09/30/2013 0953      Component Value Date/Time   CALCIUM 8.8 03/05/2017 1013   ALKPHOS 56 05/15/2017 1306   AST 16 05/15/2017 1306   ALT 13 (L) 05/15/2017 1306   BILITOT 0.6 05/15/2017 1306   BILITOT 0.6 03/05/2017 1013       ASSESSMENT & PLAN:    Myelofibrosis ([place]) # 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  Currently on surveillance.  Patient's CBC - stable except for mild anemia, hemoglobin 11.9/thrombocytopenia- 130s; LDH stable ~500s.   # Mild anemia- hemoglobin 11.9; iron studies checked- no significant iron deficiency.  # Continue surveillance at this time. Consider bone marrow biopsy if platelets decrease. Abdominal ultrasound on moderate splenomegaly/no worsening.  #  Creatinine  continuing to increase, currently 2.17/getting worse. Likely unrelated to myelofibrosis.  Check bilateral kidney ultrasound.    # Smoking cessation discussed- patient not interested in quitting.  # Follow-up in 6 months; labs; ultrasounds spleen.  Addendum: Patient's bilateral kidney ultrasound negative for hydronephrosis. Patient is recommended follow up with PCP.       Cammie Sickle, MD 05/17/2017 9:25 PM

## 2017-05-15 NOTE — Assessment & Plan Note (Addendum)
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  Currently on surveillance.  Patient's CBC - stable except for mild anemia, hemoglobin 11.9/thrombocytopenia- 130s; LDH stable ~500s.   # Mild anemia- hemoglobin 11.9; iron studies checked- no significant iron deficiency.  # Continue surveillance at this time. Consider bone marrow biopsy if platelets decrease. Abdominal ultrasound on moderate splenomegaly/no worsening.  #  Creatinine continuing to increase, currently 2.17/getting worse. Likely unrelated to myelofibrosis.  Check bilateral kidney ultrasound.    # Smoking cessation discussed- patient not interested in quitting.  # Follow-up in 6 months; labs; ultrasounds spleen.  Addendum: Patient's bilateral kidney ultrasound negative for hydronephrosis. Patient is recommended follow up with PCP.

## 2017-05-17 ENCOUNTER — Telehealth: Payer: Self-pay | Admitting: Internal Medicine

## 2017-05-17 DIAGNOSIS — D7581 Myelofibrosis: Secondary | ICD-10-CM

## 2017-05-17 NOTE — Telephone Encounter (Signed)
Please inform patient that his kidney ultrasound came back okay; no evidence of obstruction. Recommend follow up with PCP regarding his chronic kidney disease.  I have ordered ultrasound of the abdomen; please have it scheduled in 6 months/prior to next visit.

## 2017-05-18 LAB — MULTIPLE MYELOMA PANEL, SERUM
ALBUMIN SERPL ELPH-MCNC: 4.1 g/dL (ref 2.9–4.4)
ALPHA 1: 0.2 g/dL (ref 0.0–0.4)
ALPHA2 GLOB SERPL ELPH-MCNC: 0.7 g/dL (ref 0.4–1.0)
Albumin/Glob SerPl: 1.6 (ref 0.7–1.7)
B-Globulin SerPl Elph-Mcnc: 0.9 g/dL (ref 0.7–1.3)
Gamma Glob SerPl Elph-Mcnc: 0.8 g/dL (ref 0.4–1.8)
Globulin, Total: 2.7 g/dL (ref 2.2–3.9)
IGG (IMMUNOGLOBIN G), SERUM: 758 mg/dL (ref 700–1600)
IGM (IMMUNOGLOBULIN M), SRM: 85 mg/dL (ref 20–172)
IgA: 155 mg/dL (ref 61–437)
TOTAL PROTEIN ELP: 6.8 g/dL (ref 6.0–8.5)

## 2017-05-18 LAB — KAPPA/LAMBDA LIGHT CHAINS
Kappa free light chain: 38.9 mg/L — ABNORMAL HIGH (ref 3.3–19.4)
Kappa, lambda light chain ratio: 1.7 — ABNORMAL HIGH (ref 0.26–1.65)
Lambda free light chains: 22.9 mg/L (ref 5.7–26.3)

## 2017-05-18 NOTE — Telephone Encounter (Signed)
Patient has been notified of these results and verbalized understanding. Message has been sent to scheduling team to set up ultrasound. Thanks!

## 2017-07-27 ENCOUNTER — Telehealth: Payer: Self-pay | Admitting: Family Medicine

## 2017-07-27 NOTE — Telephone Encounter (Signed)
Copied from [place] [id number]. Topic: Quick Communication - See Telephone Encounter >> Jul 27, 2017  2:22 PM Vernona Rieger wrote: CRM for notification. See Telephone encounter for:   Needs refills on CITALOPRAM 20MG  HYDROCHLOROTHIOZIDE LISINOPRIL    07/27/17.

## 2017-07-27 NOTE — Telephone Encounter (Signed)
Pt's wife called for refills on citalopram,HCTZ, Lisinopril. Pt has appt for 07/28/17 . Called pt and wife stated he has enough RX to last until tomorrow.

## 2017-07-28 ENCOUNTER — Telehealth: Payer: Self-pay

## 2017-07-28 ENCOUNTER — Ambulatory Visit: Payer: Medicare HMO | Admitting: Family Medicine

## 2017-07-28 ENCOUNTER — Encounter: Payer: Self-pay | Admitting: Family Medicine

## 2017-07-28 VITALS — BP 120/79 | HR 82 | Temp 98.3°F | Wt 172.0 lb

## 2017-07-28 DIAGNOSIS — M79672 Pain in left foot: Secondary | ICD-10-CM

## 2017-07-28 MED ORDER — COLCHICINE 0.6 MG PO TABS: ORAL_TABLET | ORAL | 0 refills | Status: DC

## 2017-07-28 MED ORDER — FEBUXOSTAT 40 MG PO TABS: 40.0000 mg | ORAL_TABLET | Freq: Every day | ORAL | 0 refills | Status: DC

## 2017-07-28 MED ORDER — PREDNISONE 10 MG PO TABS: 10.0000 mg | ORAL_TABLET | Freq: Every day | ORAL | 0 refills | Status: DC

## 2017-07-28 NOTE — Telephone Encounter (Signed)
Start the prednisone, with his insurance we can't give him a savings card for Uloric so that will likely be even more expensive. The prednisone should be enough to take care of it, but I will start the process for Uloric given his decreased kidney function and see if we could get it covered

## 2017-07-28 NOTE — Telephone Encounter (Signed)
Patient's wife stopped by and said the gout med was too expensive and wants to know if it can be changed to something else.

## 2017-07-28 NOTE — Telephone Encounter (Signed)
Spoke back with patient's wife. They did get the Prednisone. I told her about the Uloric and that we'd have to do a prior auth and she said not to go thru the trouble and they'd just do the prednisone.

## 2017-07-28 NOTE — Patient Instructions (Signed)
Follow up as needed

## 2017-07-28 NOTE — Progress Notes (Signed)
BP 120/79   Pulse 82   Temp 98.3 F (36.8 C)   Wt 172 lb (78 kg)   SpO2 100%   BMI 28.36 kg/m    Subjective:    [name], male    DOB: [birth year], 61 y.o.   MRN: [id number]  HPI: James Arnold is a 61 y.o. male  Chief Complaint  Patient presents with  . Gout    left foot since Sunday. Pain, swelling, redness. Never been diagnosed with Gout before.    Patient presents with left great toe pain, swelling, heat, and redness x 3 days. Can't tolerate weight bearing since onset. No known injury, no past hx of gout. Does drink a fair amount of beer and eat lots of sweets per patient. Has not tried anything OTC for sxs.   Relevant past medical, surgical, family and social history reviewed and updated as indicated. Interim medical history since our last visit reviewed. Allergies and medications reviewed and updated.  Review of Systems  Constitutional: Negative.   HENT: Negative.   Respiratory: Negative.   Cardiovascular: Negative.   Gastrointestinal: Negative.   Genitourinary: Negative.   Musculoskeletal: Positive for arthralgias, gait problem and joint swelling.  Psychiatric/Behavioral: Negative.     Per HPI unless specifically indicated above     Objective:    BP 120/79   Pulse 82   Temp 98.3 F (36.8 C)   Wt 172 lb (78 kg)   SpO2 100%   BMI 28.36 kg/m   Wt Readings from Last 3 Encounters:  07/28/17 172 lb (78 kg)  05/15/17 174 lb 12.8 oz (79.3 kg)  03/05/17 172 lb 8 oz (78.2 kg)    Physical Exam  Constitutional: He is oriented to person, place, and time. He appears well-developed and well-nourished. No distress.  HENT:  Head: Atraumatic.  Eyes: Conjunctivae are normal. Pupils are equal, round, and reactive to light.  Neck: Normal range of motion. Neck supple.  Cardiovascular: Normal rate and normal heart sounds.  Pulmonary/Chest: Effort normal and breath sounds normal.  Musculoskeletal: He exhibits tenderness (severe tenderness at base of left  great toe).  Neurological: He is alert and oriented to person, place, and time.  Skin: Skin is warm and dry. There is erythema (left foot at base of great toe is erythematous, warm to the touch, and edematous).  Psychiatric: He has a normal mood and affect. His behavior is normal.  Nursing note and vitals reviewed.   Results for orders placed or performed in visit on 05/15/17  CBC with Differential  Result Value Ref Range   WBC 3.7 (L) 3.8 - 10.6 K/uL   RBC 3.74 (L) 4.40 - 5.90 MIL/uL   Hemoglobin 11.1 (L) 13.0 - 18.0 g/dL   HCT 32.2 (L) 40.0 - 52.0 %   MCV 86.0 80.0 - 100.0 fL   MCH 29.6 26.0 - 34.0 pg   MCHC 34.4 32.0 - 36.0 g/dL   RDW 18.3 (H) 11.5 - 14.5 %   Platelets 138 (L) 150 - 440 K/uL   Neutrophils Relative % 77 %   Neutro Abs 2.9 1.4 - 6.5 K/uL   Lymphocytes Relative 19 %   Lymphs Abs 0.7 (L) 1.0 - 3.6 K/uL   Monocytes Relative 4 %   Monocytes Absolute 0.1 (L) 0.2 - 1.0 K/uL   Eosinophils Relative 0 %   Eosinophils Absolute 0.0 0 - 0.7 K/uL   Basophils Relative 0 %   Basophils Absolute 0.0 0 - 0.1 K/uL  Creatinine, serum  Result Value Ref Range   Creatinine, Ser 2.17 (H) 0.61 - 1.24 mg/dL   GFR calc non Af Amer 31 (L) >60 mL/min   GFR calc Af Amer 36 (L) >60 mL/min  Lactate dehydrogenase  Result Value Ref Range   LDH 509 (H) 98 - 192 U/L  Iron and TIBC  Result Value Ref Range   Iron 105 45 - 182 ug/dL   TIBC 293 250 - 450 ug/dL   Saturation Ratios 36 17.9 - 39.5 %   UIBC 188 ug/dL  Ferritin  Result Value Ref Range   Ferritin 117 24 - 336 ng/mL  Multiple Myeloma Panel (SPEP&IFE w/QIG)  Result Value Ref Range   IgG (Immunoglobin G), Serum 758 700 - 1,600 mg/dL   IgA 155 61 - 437 mg/dL   IgM (Immunoglobulin M), Srm 85 20 - 172 mg/dL   Total Protein ELP 6.8 6.0 - 8.5 g/dL   Albumin SerPl Elph-Mcnc 4.1 2.9 - 4.4 g/dL   Alpha 1 0.2 0.0 - 0.4 g/dL   Alpha2 Glob SerPl Elph-Mcnc 0.7 0.4 - 1.0 g/dL   B-Globulin SerPl Elph-Mcnc 0.9 0.7 - 1.3 g/dL   Gamma Glob  SerPl Elph-Mcnc 0.8 0.4 - 1.8 g/dL   M Protein SerPl Elph-Mcnc Not Observed Not Observed g/dL   Globulin, Total 2.7 2.2 - 3.9 g/dL   Albumin/Glob SerPl 1.6 0.7 - 1.7   IFE 1 Comment    Please Note Comment   Kappa/lambda light chains  Result Value Ref Range   Kappa free light chain 38.9 (H) 3.3 - 19.4 mg/L   Lamda free light chains 22.9 5.7 - 26.3 mg/L   Kappa, lamda light chain ratio 1.70 (H) 0.26 - 1.65  Hepatic function panel  Result Value Ref Range   Total Protein 7.0 6.5 - 8.1 g/dL   Albumin 4.2 3.5 - 5.0 g/dL   AST 16 15 - 41 U/L   ALT 13 (L) 17 - 63 U/L   Alkaline Phosphatase 56 38 - 126 U/L   Total Bilirubin 0.6 0.3 - 1.2 mg/dL   Bilirubin, Direct <0.1 (L) 0.1 - 0.5 mg/dL   Indirect Bilirubin NOT CALCULATED 0.3 - 0.9 mg/dL      Assessment & Plan:   Problem List Items Addressed This Visit    None    Visit Diagnoses    Left foot pain    -  Primary   Relevant Orders   Uric acid    Appearance very consistent with gout, this would be his first flare. Will check uric acid today to confirm. Start prednisone, tylenol prn. Avoid NSAIDs given decreased renal function. Pt states cannot afford colchicine, will start process with Uloric in meantime. Discussed dietary modifications, adding tart cherry juice daily, reducing beer consumption, sweets, red meats. May need to come off HCTZ in the future.    Follow up plan: Return for as scheduled.

## 2017-07-29 LAB — URIC ACID: URIC ACID: 9.7 mg/dL — AB (ref 3.7–8.6)

## 2017-07-30 ENCOUNTER — Telehealth: Payer: Self-pay

## 2017-07-30 ENCOUNTER — Telehealth: Payer: Self-pay | Admitting: Family Medicine

## 2017-07-30 NOTE — Telephone Encounter (Signed)
Specifically noted he's not a good candidate for allopurinol given his elevated Cr. Can you look into his PA for Uloric and advise?

## 2017-07-30 NOTE — Telephone Encounter (Signed)
Pharmacy sent a fax stating "prerequisite therapy required for uloric, try allopurinol per insurance". Can we send in allopurinol instead for the patient?

## 2017-07-30 NOTE — Telephone Encounter (Signed)
Copied from [facility] #[id number]. Topic: Quick Communication - See Telephone Encounter >> Jul 30, 2017  4:40 PM Boyd Kerbs wrote: CRM for notification. See Telephone encounter for:  Wanting to see if prescription faxed to [facility] in [place] did not receive fax.  Please re-send, and make sure sent to [facility] not [facility] 07/30/17.

## 2017-07-30 NOTE — Telephone Encounter (Signed)
PA was approved and patient's wife notified.

## 2017-07-31 NOTE — Telephone Encounter (Signed)
Patient does not need anything at this time. 

## 2017-08-19 ENCOUNTER — Other Ambulatory Visit: Payer: Self-pay | Admitting: Family Medicine

## 2017-08-27 ENCOUNTER — Ambulatory Visit: Payer: Medicare HMO | Admitting: Family Medicine

## 2017-08-27 ENCOUNTER — Encounter: Payer: Self-pay | Admitting: Family Medicine

## 2017-08-27 VITALS — BP 130/69 | HR 75 | Temp 98.7°F | Wt 176.0 lb

## 2017-08-27 DIAGNOSIS — F339 Major depressive disorder, recurrent, unspecified: Secondary | ICD-10-CM

## 2017-08-27 DIAGNOSIS — I129 Hypertensive chronic kidney disease with stage 1 through stage 4 chronic kidney disease, or unspecified chronic kidney disease: Secondary | ICD-10-CM

## 2017-08-27 DIAGNOSIS — E782 Mixed hyperlipidemia: Secondary | ICD-10-CM

## 2017-08-27 DIAGNOSIS — D7581 Myelofibrosis: Secondary | ICD-10-CM | POA: Diagnosis not present

## 2017-08-27 DIAGNOSIS — M109 Gout, unspecified: Secondary | ICD-10-CM

## 2017-08-27 MED ORDER — AMLODIPINE BESYLATE 2.5 MG PO TABS: 2.5000 mg | ORAL_TABLET | Freq: Every day | ORAL | 3 refills | Status: DC

## 2017-08-27 MED ORDER — LISINOPRIL 20 MG PO TABS
20.0000 mg | ORAL_TABLET | Freq: Every day | ORAL | 1 refills | Status: DC
Start: 2017-08-27 — End: 2017-12-30

## 2017-08-27 MED ORDER — CITALOPRAM HYDROBROMIDE 20 MG PO TABS: 20.0000 mg | ORAL_TABLET | Freq: Every day | ORAL | 1 refills | Status: DC

## 2017-08-27 MED ORDER — PREDNISONE 10 MG PO TABS
ORAL_TABLET | ORAL | 0 refills | Status: DC
Start: 2017-08-27 — End: 2017-09-25

## 2017-08-27 NOTE — Assessment & Plan Note (Signed)
Stable on current regimen. Continue current regimen. Continue to monitor. Call with any concerns.  

## 2017-08-27 NOTE — Assessment & Plan Note (Signed)
Rechecking levels today. Await results. Call with any concerns. Continue to follow with hematology.

## 2017-08-27 NOTE — Assessment & Plan Note (Signed)
Under good control, but having gout flares. Will stop HCTZ and will start amlodipine. Continue to monitor.

## 2017-08-27 NOTE — Patient Instructions (Signed)
STOP THE HYDROCHLORITHIAZIDE

## 2017-08-27 NOTE — Assessment & Plan Note (Signed)
Rechecking levels today. Await results. Treat as needed.  

## 2017-08-27 NOTE — Assessment & Plan Note (Signed)
No better. Feeling worse. Will treat with prednisone and check kidney function before starting allopurinol. Await results. Stop HCTZ.

## 2017-08-27 NOTE — Progress Notes (Signed)
BP 130/69   Pulse 75   Temp 98.7 F (37.1 C) (Oral)   Wt 176 lb (79.8 kg)   SpO2 100%   BMI 29.02 kg/m    Subjective:    [name], male    DOB: [birth year], 61 y.o.   MRN: [id number]  HPI: James Arnold is a 61 y.o. male  Chief Complaint  Patient presents with  . Depression  . Hypertension  . Gout    pt states that they could not afford uloric or colchicine, prednisone did not help. States they ended up getting the colchicine but it did not help   HYPERTENSION Hypertension status: controlled  Satisfied with current treatment? yes Duration of hypertension: chronic BP monitoring frequency:  not checking BP medication side effects:  no Medication compliance: excellent compliance Previous BP meds: hctz, lisinoptil Aspirin: yes Recurrent headaches: no Visual changes: no Palpitations: no Dyspnea: no Chest pain: no Lower extremity edema: no Dizzy/lightheaded: no  GOUT Duration: month and a half Right 1st metatarsophalangeal pain: yes Left 1st metatarsophalangeal pain: yes Right knee pain: no Left knee pain: no Severity: moderate  Quality: sharp Swelling: yes Redness: yes Trauma: no Recent dietary change or indiscretion: no Fevers: no Nausea/vomiting: no Aggravating factors: Alleviating factors:  Status:  stable Treatments attempted:  DEPRESSION Mood status: controlled Satisfied with current treatment?: yes Symptom severity: mild  Duration of current treatment : chronic Side effects: no Medication compliance: excellent compliance Psychotherapy/counseling: no  Previous psychiatric medications: celexa Depressed mood: no Anxious mood: no Anhedonia: no Significant weight loss or gain: no Insomnia: no  Fatigue: yes Feelings of worthlessness or guilt: no Impaired concentration/indecisiveness: no Suicidal ideations: no Hopelessness: no Crying spells: no Depression screen [facility] 2/9 08/27/2017 03/05/2017 02/18/2017 06/18/2016 05/07/2015    Decreased Interest 1 0 0 1 0  Down, Depressed, Hopeless 1 0 0 0 0  PHQ - 2 Score 2 0 0 1 0  Altered sleeping 2 - 0 - -  Tired, decreased energy 2 - 1 - -  Change in appetite 2 - 0 - -  Feeling bad or failure about yourself  0 - 0 - -  Trouble concentrating 0 - 0 - -  Moving slowly or fidgety/restless 0 - 0 - -  Suicidal thoughts 0 - 0 - -  PHQ-9 Score 8 - 1 - -  Difficult doing work/chores Not difficult at all - - - -    Relevant past medical, surgical, family and social history reviewed and updated as indicated. Interim medical history since our last visit reviewed. Allergies and medications reviewed and updated.  Review of Systems  Constitutional: Negative.   Respiratory: Negative.   Cardiovascular: Negative.   Musculoskeletal: Positive for arthralgias, gait problem and joint swelling. Negative for back pain, myalgias, neck pain and neck stiffness.  Psychiatric/Behavioral: Negative.     Per HPI unless specifically indicated above     Objective:    BP 130/69   Pulse 75   Temp 98.7 F (37.1 C) (Oral)   Wt 176 lb (79.8 kg)   SpO2 100%   BMI 29.02 kg/m   Wt Readings from Last 3 Encounters:  08/27/17 176 lb (79.8 kg)  07/28/17 172 lb (78 kg)  05/15/17 174 lb 12.8 oz (79.3 kg)    Physical Exam  Constitutional: He is oriented to person, place, and time. He appears well-developed and well-nourished. No distress.  HENT:  Head: Normocephalic and atraumatic.  Right Ear: Hearing normal.  Left Ear: Hearing  normal.  Nose: Nose normal.  Eyes: Conjunctivae and lids are normal. Right eye exhibits no discharge. Left eye exhibits no discharge. No scleral icterus.  Cardiovascular: Normal rate, regular rhythm, normal heart sounds and intact distal pulses. Exam reveals no gallop and no friction rub.  No murmur heard. Pulmonary/Chest: Effort normal and breath sounds normal. No respiratory distress. He has no wheezes. He has no rales. He exhibits no tenderness.  Musculoskeletal:  Normal range of motion.  Neurological: He is alert and oriented to person, place, and time.  Skin: Skin is warm, dry and intact. No rash noted. He is not diaphoretic. No erythema. No pallor.  Psychiatric: He has a normal mood and affect. His speech is normal and behavior is normal. Judgment and thought content normal. Cognition and memory are normal.  Nursing note and vitals reviewed.   Results for orders placed or performed in visit on 07/28/17  Uric acid  Result Value Ref Range   Uric Acid 9.7 (H) 3.7 - 8.6 mg/dL      Assessment & Plan:   Problem List Items Addressed This Visit      Genitourinary   Benign hypertensive renal disease - Primary    Under good control, but having gout flares. Will stop HCTZ and will start amlodipine. Continue to monitor.       Relevant Orders   Comprehensive metabolic panel     Other   Myelofibrosis ([place])    Rechecking levels today. Await results. Call with any concerns. Continue to follow with hematology.      Relevant Orders   CBC with Differential/Platelet   Comprehensive metabolic panel   Hyperlipidemia    Rechecking levels today. Await results. Treat as needed.       Relevant Medications   amLODipine (NORVASC) 2.5 MG tablet   lisinopril (PRINIVIL,ZESTRIL) 20 MG tablet   Other Relevant Orders   Comprehensive metabolic panel   Lipid Panel w/o Chol/HDL Ratio   Depression    Stable on current regimen. Continue current regimen. Continue to monitor. Call with any concerns.       Relevant Medications   citalopram (CELEXA) 20 MG tablet   Gout    No better. Feeling worse. Will treat with prednisone and check kidney function before starting allopurinol. Await results. Stop HCTZ.       Relevant Orders   Uric acid       Follow up plan: Return in about 4 weeks (around 09/24/2017) for recheck gout and BP.

## 2017-08-28 ENCOUNTER — Telehealth: Payer: Self-pay | Admitting: Family Medicine

## 2017-08-28 DIAGNOSIS — I129 Hypertensive chronic kidney disease with stage 1 through stage 4 chronic kidney disease, or unspecified chronic kidney disease: Secondary | ICD-10-CM

## 2017-08-28 DIAGNOSIS — N183 Chronic kidney disease, stage 3 unspecified: Secondary | ICD-10-CM

## 2017-08-28 DIAGNOSIS — M109 Gout, unspecified: Secondary | ICD-10-CM

## 2017-08-28 LAB — CBC WITH DIFFERENTIAL/PLATELET
Basophils Absolute: 0 10*3/uL (ref 0.0–0.2)
Basos: 0 %
EOS (ABSOLUTE): 0 10*3/uL (ref 0.0–0.4)
EOS: 0 %
HEMATOCRIT: 35.6 % — AB (ref 37.5–51.0)
HEMOGLOBIN: 11.5 g/dL — AB (ref 13.0–17.7)
IMMATURE GRANULOCYTES: 0 %
Immature Grans (Abs): 0 10*3/uL (ref 0.0–0.1)
Lymphocytes Absolute: 0.6 10*3/uL — ABNORMAL LOW (ref 0.7–3.1)
Lymphs: 13 %
MCH: 28.1 pg (ref 26.6–33.0)
MCHC: 32.3 g/dL (ref 31.5–35.7)
MCV: 87 fL (ref 79–97)
MONOCYTES: 3 %
Monocytes Absolute: 0.2 10*3/uL (ref 0.1–0.9)
NEUTROS PCT: 84 %
Neutrophils Absolute: 4 10*3/uL (ref 1.4–7.0)
Platelets: 120 10*3/uL — ABNORMAL LOW (ref 150–379)
RBC: 4.09 x10E6/uL — AB (ref 4.14–5.80)
RDW: 19.8 % — ABNORMAL HIGH (ref 12.3–15.4)
WBC: 4.8 10*3/uL (ref 3.4–10.8)

## 2017-08-28 LAB — COMPREHENSIVE METABOLIC PANEL
ALT: 8 IU/L (ref 0–44)
AST: 14 IU/L (ref 0–40)
Albumin/Globulin Ratio: 1.9 (ref 1.2–2.2)
Albumin: 4.5 g/dL (ref 3.6–4.8)
Alkaline Phosphatase: 71 IU/L (ref 39–117)
BUN/Creatinine Ratio: 16 (ref 10–24)
BUN: 32 mg/dL — ABNORMAL HIGH (ref 8–27)
Bilirubin Total: 0.5 mg/dL (ref 0.0–1.2)
CALCIUM: 9.2 mg/dL (ref 8.6–10.2)
CO2: 21 mmol/L (ref 20–29)
CREATININE: 2 mg/dL — AB (ref 0.76–1.27)
Chloride: 99 mmol/L (ref 96–106)
GFR calc Af Amer: 40 mL/min/{1.73_m2} — ABNORMAL LOW (ref >59)
GFR, EST NON AFRICAN AMERICAN: 35 mL/min/{1.73_m2} — AB (ref >59)
GLOBULIN, TOTAL: 2.4 g/dL (ref 1.5–4.5)
Glucose: 71 mg/dL (ref 65–99)
Potassium: 5.2 mmol/L (ref 3.5–5.2)
SODIUM: 137 mmol/L (ref 134–144)
Total Protein: 6.9 g/dL (ref 6.0–8.5)

## 2017-08-28 LAB — LIPID PANEL W/O CHOL/HDL RATIO
CHOLESTEROL TOTAL: 183 mg/dL (ref 100–199)
HDL: 25 mg/dL — AB (ref >39)
LDL CALC: 117 mg/dL — AB (ref 0–99)
Triglycerides: 203 mg/dL — ABNORMAL HIGH (ref 0–149)
VLDL CHOLESTEROL CAL: 41 mg/dL — AB (ref 5–40)

## 2017-08-28 LAB — URIC ACID: Uric Acid: 9.5 mg/dL — ABNORMAL HIGH (ref 3.7–8.6)

## 2017-08-28 MED ORDER — ALLOPURINOL 100 MG PO TABS: 50.0000 mg | ORAL_TABLET | Freq: Every day | ORAL | 3 refills | Status: DC

## 2017-08-28 NOTE — Telephone Encounter (Signed)
Kidney function still not good. Will get him into nephrology. Have stopped diuretic. Will start allopurinol, due to cost, but will renally dose him at 50mg  daily. He is aware. Recheck BMP at his follow up

## 2017-09-04 ENCOUNTER — Ambulatory Visit: Payer: Medicare HMO | Admitting: Family Medicine

## 2017-09-25 ENCOUNTER — Ambulatory Visit: Payer: Medicare HMO | Admitting: Family Medicine

## 2017-09-25 ENCOUNTER — Encounter: Payer: Self-pay | Admitting: Family Medicine

## 2017-09-25 VITALS — BP 137/78 | HR 76 | Temp 98.4°F | Wt 178.3 lb

## 2017-09-25 DIAGNOSIS — I129 Hypertensive chronic kidney disease with stage 1 through stage 4 chronic kidney disease, or unspecified chronic kidney disease: Secondary | ICD-10-CM | POA: Diagnosis not present

## 2017-09-25 DIAGNOSIS — N183 Chronic kidney disease, stage 3 unspecified: Secondary | ICD-10-CM

## 2017-09-25 DIAGNOSIS — M109 Gout, unspecified: Secondary | ICD-10-CM

## 2017-09-25 MED ORDER — ALLOPURINOL 100 MG PO TABS: 50.0000 mg | ORAL_TABLET | Freq: Every day | ORAL | 3 refills | Status: DC

## 2017-09-25 MED ORDER — AMLODIPINE BESYLATE 2.5 MG PO TABS: 2.5000 mg | ORAL_TABLET | Freq: Every day | ORAL | 1 refills | Status: DC

## 2017-09-25 NOTE — Progress Notes (Signed)
BP 137/78 (BP Location: Left Arm, Patient Position: Sitting, Cuff Size: Normal)   Pulse 76   Temp 98.4 F (36.9 C)   Wt 178 lb 5 oz (80.9 kg)   SpO2 100%   BMI 29.40 kg/m    Subjective:    [name], male    DOB: 28-Sep-1955, 62 y.o.   MRN: [id number]  HPI: JOVAUGHN WOJTASZEK is a 62 y.o. male  Chief Complaint  Patient presents with  . Hypertension  . Gout   HYPERTENSION Hypertension status: controlled  Satisfied with current treatment? yes Duration of hypertension: chronic BP monitoring frequency:  not checking BP medication side effects:  no Medication compliance: excellent compliance Previous BP meds: amlodipine, lisinopril Aspirin: no Recurrent headaches: no Visual changes: no Palpitations: no Dyspnea: no Chest pain: no Lower extremity edema: no Dizzy/lightheaded: no  GOUT- flare resolved with medication. Has not had any issues with the allopurinol. Has not had any flares. Feeling well. No concerns.    Relevant past medical, surgical, family and social history reviewed and updated as indicated. Interim medical history since our last visit reviewed. Allergies and medications reviewed and updated.  Review of Systems  Constitutional: Negative.   Respiratory: Negative.   Cardiovascular: Negative.   Psychiatric/Behavioral: Negative.     Per HPI unless specifically indicated above     Objective:    BP 137/78 (BP Location: Left Arm, Patient Position: Sitting, Cuff Size: Normal)   Pulse 76   Temp 98.4 F (36.9 C)   Wt 178 lb 5 oz (80.9 kg)   SpO2 100%   BMI 29.40 kg/m   Wt Readings from Last 3 Encounters:  09/25/17 178 lb 5 oz (80.9 kg)  08/27/17 176 lb (79.8 kg)  07/28/17 172 lb (78 kg)    Physical Exam  Constitutional: He is oriented to person, place, and time. He appears well-developed and well-nourished. No distress.  HENT:  Head: Normocephalic and atraumatic.  Right Ear: Hearing normal.  Left Ear: Hearing normal.  Nose: Nose  normal.  Eyes: Conjunctivae and lids are normal. Right eye exhibits no discharge. Left eye exhibits no discharge. No scleral icterus.  Cardiovascular: Normal rate, regular rhythm, normal heart sounds and intact distal pulses. Exam reveals no gallop and no friction rub.  No murmur heard. Pulmonary/Chest: Effort normal and breath sounds normal. No respiratory distress. He has no wheezes. He has no rales. He exhibits no tenderness.  Musculoskeletal: Normal range of motion. He exhibits no edema, tenderness or deformity.  Neurological: He is alert and oriented to person, place, and time.  Skin: Skin is warm, dry and intact. No rash noted. He is not diaphoretic. No erythema. No pallor.  Psychiatric: He has a normal mood and affect. His speech is normal and behavior is normal. Judgment and thought content normal. Cognition and memory are normal.  Nursing note and vitals reviewed.   Results for orders placed or performed in visit on 08/27/17  CBC with Differential/Platelet  Result Value Ref Range   WBC 4.8 3.4 - 10.8 x10E3/uL   RBC 4.09 (L) 4.14 - 5.80 x10E6/uL   Hemoglobin 11.5 (L) 13.0 - 17.7 g/dL   Hematocrit 35.6 (L) 37.5 - 51.0 %   MCV 87 79 - 97 fL   MCH 28.1 26.6 - 33.0 pg   MCHC 32.3 31.5 - 35.7 g/dL   RDW 19.8 (H) 12.3 - 15.4 %   Platelets 120 (L) 150 - 379 x10E3/uL   Neutrophils 84 Not Estab. %  Lymphs 13 Not Estab. %   Monocytes 3 Not Estab. %   Eos 0 Not Estab. %   Basos 0 Not Estab. %   Neutrophils Absolute 4.0 1.4 - 7.0 x10E3/uL   Lymphocytes Absolute 0.6 (L) 0.7 - 3.1 x10E3/uL   Monocytes Absolute 0.2 0.1 - 0.9 x10E3/uL   EOS (ABSOLUTE) 0.0 0.0 - 0.4 x10E3/uL   Basophils Absolute 0.0 0.0 - 0.2 x10E3/uL   Immature Granulocytes 0 Not Estab. %   Immature Grans (Abs) 0.0 0.0 - 0.1 x10E3/uL  Comprehensive metabolic panel  Result Value Ref Range   Glucose 71 65 - 99 mg/dL   BUN 32 (H) 8 - 27 mg/dL   Creatinine, Ser 2.00 (H) 0.76 - 1.27 mg/dL   GFR calc non Af Amer 35 (L)  >59 mL/min/1.73   GFR calc Af Amer 40 (L) >59 mL/min/1.73   BUN/Creatinine Ratio 16 10 - 24   Sodium 137 134 - 144 mmol/L   Potassium 5.2 3.5 - 5.2 mmol/L   Chloride 99 96 - 106 mmol/L   CO2 21 20 - 29 mmol/L   Calcium 9.2 8.6 - 10.2 mg/dL   Total Protein 6.9 6.0 - 8.5 g/dL   Albumin 4.5 3.6 - 4.8 g/dL   Globulin, Total 2.4 1.5 - 4.5 g/dL   Albumin/Globulin Ratio 1.9 1.2 - 2.2   Bilirubin Total 0.5 0.0 - 1.2 mg/dL   Alkaline Phosphatase 71 39 - 117 IU/L   AST 14 0 - 40 IU/L   ALT 8 0 - 44 IU/L  Uric acid  Result Value Ref Range   Uric Acid 9.5 (H) 3.7 - 8.6 mg/dL  Lipid Panel w/o Chol/HDL Ratio  Result Value Ref Range   Cholesterol, Total 183 100 - 199 mg/dL   Triglycerides 203 (H) 0 - 149 mg/dL   HDL 25 (L) >39 mg/dL   VLDL Cholesterol Cal 41 (H) 5 - 40 mg/dL   LDL Calculated 117 (H) 0 - 99 mg/dL      Assessment & Plan:   Problem List Items Addressed This Visit      Genitourinary   Benign hypertensive renal disease - Primary    Under good control. Continue current regimen. Continue to monitor.       Relevant Orders   Comprehensive metabolic panel   CKD (chronic kidney disease), stage III (HCC)    Seeing Dr. Holley Raring on 10/01/17.       Relevant Orders   Comprehensive metabolic panel     Other   Gout    No issues right now. Rechecking uric acid today. Continue current regimen. Continue to monitor. Call with any concerns.       Relevant Orders   Comprehensive metabolic panel   Uric acid       Follow up plan: Return in about 6 months (around 03/25/2018).

## 2017-09-25 NOTE — Assessment & Plan Note (Signed)
No issues right now. Rechecking uric acid today. Continue current regimen. Continue to monitor. Call with any concerns.

## 2017-09-25 NOTE — Assessment & Plan Note (Signed)
Under good control. Continue current regimen. Continue to monitor.  

## 2017-09-25 NOTE — Assessment & Plan Note (Signed)
Seeing Dr. Holley Raring on 10/01/17.

## 2017-09-26 LAB — COMPREHENSIVE METABOLIC PANEL
A/G RATIO: 2.3 — AB (ref 1.2–2.2)
ALBUMIN: 4.6 g/dL (ref 3.6–4.8)
ALK PHOS: 74 IU/L (ref 39–117)
ALT: 10 IU/L (ref 0–44)
AST: 18 IU/L (ref 0–40)
BILIRUBIN TOTAL: 0.3 mg/dL (ref 0.0–1.2)
BUN / CREAT RATIO: 13 (ref 10–24)
BUN: 27 mg/dL (ref 8–27)
CHLORIDE: 105 mmol/L (ref 96–106)
CO2: 17 mmol/L — AB (ref 20–29)
Calcium: 9 mg/dL (ref 8.6–10.2)
Creatinine, Ser: 2.01 mg/dL — ABNORMAL HIGH (ref 0.76–1.27)
GFR calc Af Amer: 40 mL/min/{1.73_m2} — ABNORMAL LOW (ref >59)
GFR calc non Af Amer: 35 mL/min/{1.73_m2} — ABNORMAL LOW (ref >59)
GLOBULIN, TOTAL: 2 g/dL (ref 1.5–4.5)
Glucose: 89 mg/dL (ref 65–99)
POTASSIUM: 5.3 mmol/L — AB (ref 3.5–5.2)
SODIUM: 138 mmol/L (ref 134–144)
Total Protein: 6.6 g/dL (ref 6.0–8.5)

## 2017-09-26 LAB — URIC ACID: URIC ACID: 8.2 mg/dL (ref 3.7–8.6)

## 2017-09-28 ENCOUNTER — Encounter: Payer: Self-pay | Admitting: Family Medicine

## 2017-10-01 DIAGNOSIS — I10 Essential (primary) hypertension: Secondary | ICD-10-CM | POA: Diagnosis not present

## 2017-10-01 DIAGNOSIS — N183 Chronic kidney disease, stage 3 (moderate): Secondary | ICD-10-CM | POA: Diagnosis not present

## 2017-10-01 DIAGNOSIS — R809 Proteinuria, unspecified: Secondary | ICD-10-CM | POA: Diagnosis not present

## 2017-10-10 ENCOUNTER — Other Ambulatory Visit: Payer: Self-pay | Admitting: Family Medicine

## 2017-10-13 ENCOUNTER — Ambulatory Visit (INDEPENDENT_AMBULATORY_CARE_PROVIDER_SITE_OTHER): Payer: Medicare HMO | Admitting: Family Medicine

## 2017-10-13 ENCOUNTER — Ambulatory Visit: Payer: Self-pay | Admitting: *Deleted

## 2017-10-13 ENCOUNTER — Encounter: Payer: Self-pay | Admitting: Family Medicine

## 2017-10-13 VITALS — BP 109/71 | HR 89 | Temp 98.4°F | Wt 176.3 lb

## 2017-10-13 DIAGNOSIS — N183 Chronic kidney disease, stage 3 (moderate): Secondary | ICD-10-CM | POA: Diagnosis not present

## 2017-10-13 DIAGNOSIS — J209 Acute bronchitis, unspecified: Secondary | ICD-10-CM

## 2017-10-13 MED ORDER — PREDNISONE 10 MG PO TABS
ORAL_TABLET | ORAL | 0 refills | Status: DC
Start: 2017-10-13 — End: 2017-10-28

## 2017-10-13 MED ORDER — AZITHROMYCIN 250 MG PO TABS: ORAL_TABLET | ORAL | 0 refills | Status: DC

## 2017-10-13 NOTE — Telephone Encounter (Signed)
Pulse  Oximetry  Is   98  Per  Cent    Pt  Has   Been  Coughing  For  About  5  Days  He  Reports  Pain in  The  Chest   When  He   Coughs      He  Reports  Some   Weakness  As  Well         He   Is  Awake   And  Alert  And  Oriented  Appointment  Made  Today  With  [facility]   Reason for Disposition . SEVERE coughing spells (e.g., whooping sound after coughing, vomiting after coughing)  Answer Assessment - Initial Assessment Questions 1. ONSET: "When did the cough begin?"       5  Days    Ago   2. SEVERITY: "How bad is the cough today?"        mod 3. RESPIRATORY DISTRESS: "Describe your breathing."       Breathing  Ok    Weak   As   Well   4. FEVER: "Do you have a fever?" If so, ask: "What is your temperature, how was it measured, and when did it start?"       No 5. HEMOPTYSIS: "Are you coughing up any blood?" If so ask: "How much?" (flecks, streaks, tablespoons, etc.)     NO 6. TREATMENT: "What have you done so far to treat the cough?" (e.g., meds, fluids, humidifier)      DRINKING  SPRITE    7. CARDIAC HISTORY: "Do you have any history of heart disease?" (e.g., heart attack, congestive heart failure)        STINT    8. LUNG HISTORY: "Do you have any history of lung disease?"  (e.g., pulmonary embolus, asthma, emphysema)     NO 9. PE RISK FACTORS: "Do you have a history of blood clots?" (or: recent major surgery, recent prolonged travel, bedridden )       NO  10. OTHER SYMPTOMS: "Do you have any other symptoms? (e.g., runny nose, wheezing, chest pain)        PT  REPORTS  PAIN  CHEST  WORSE   WHEN  HE   COUGHS    11. PREGNANCY: "Is there any chance you are pregnant?" "When was your last menstrual period?"       NA 12. TRAVEL: "Have you traveled out of the country in the last month?" (e.g., travel history, exposures)       NO  Protocols used: COUGH - ACUTE NON-PRODUCTIVE-A-AH

## 2017-10-13 NOTE — Progress Notes (Signed)
BP 109/71   Pulse 89   Temp 98.4 F (36.9 C) (Oral)   Wt 176 lb 4.8 oz (80 kg)   SpO2 98%   BMI 29.07 kg/m    Subjective:    [name], male    DOB: Sep 17, 1956, 62 y.o.   MRN: [id number]  HPI: James Arnold is a 62 y.o. male  Chief Complaint  Patient presents with  . Fatigue    patient states he has been fatigued, had a dry cough, chest soreness when coughing and nodding off during the day for the past 5 days.   UPPER RESPIRATORY TRACT INFECTION Duration: 5 days Worst symptom: fatigue and cough Fever: no Cough: yes Shortness of breath: yes Wheezing: yes Chest pain: yes, with cough Chest tightness: yes Chest congestion: no Nasal congestion: yes Runny nose: yes Post nasal drip: yes Sneezing: yes Sore throat: yes Swollen glands: no Sinus pressure: no Headache: no Face pain: no Toothache: no Ear pain: no  Ear pressure: no  Eyes red/itching:no Eye drainage/crusting: no  Vomiting: yes Rash: no Fatigue: yes Sick contacts: no Strep contacts: no  Context: stable Recurrent sinusitis: no Relief with OTC cold/cough medications: no  Treatments attempted: none    Relevant past medical, surgical, family and social history reviewed and updated as indicated. Interim medical history since our last visit reviewed. Allergies and medications reviewed and updated.  Review of Systems  Constitutional: Positive for chills and fatigue. Negative for activity change, appetite change, diaphoresis, fever and unexpected weight change.  HENT: Positive for congestion, postnasal drip, rhinorrhea and sneezing. Negative for dental problem, drooling, ear discharge, ear pain, facial swelling, hearing loss, mouth sores, nosebleeds, sinus pressure, sinus pain, sore throat, tinnitus, trouble swallowing and voice change.   Respiratory: Positive for cough, shortness of breath and wheezing. Negative for apnea, choking, chest tightness and stridor.   Cardiovascular: Negative.     Neurological: Negative.   Psychiatric/Behavioral: Negative.     Per HPI unless specifically indicated above     Objective:    BP 109/71   Pulse 89   Temp 98.4 F (36.9 C) (Oral)   Wt 176 lb 4.8 oz (80 kg)   SpO2 98%   BMI 29.07 kg/m   Wt Readings from Last 3 Encounters:  10/13/17 176 lb 4.8 oz (80 kg)  09/25/17 178 lb 5 oz (80.9 kg)  08/27/17 176 lb (79.8 kg)    Physical Exam  Constitutional: He is oriented to person, place, and time. He appears well-developed and well-nourished. No distress.  HENT:  Head: Normocephalic and atraumatic.  Right Ear: Hearing and external ear normal.  Left Ear: Hearing and external ear normal.  Nose: Nose normal.  Mouth/Throat: Oropharynx is clear and moist. No oropharyngeal exudate.  Eyes: Conjunctivae, EOM and lids are normal. Pupils are equal, round, and reactive to light. Right eye exhibits no discharge. Left eye exhibits no discharge. No scleral icterus.  Neck: Normal range of motion. Neck supple. No JVD present. No tracheal deviation present. No thyromegaly present.  Cardiovascular: Normal rate, regular rhythm, normal heart sounds and intact distal pulses. Exam reveals no gallop and no friction rub.  No murmur heard. Pulmonary/Chest: Effort normal. No stridor. No respiratory distress. He has decreased breath sounds in the right upper field, the right middle field, the right lower field, the left upper field, the left middle field and the left lower field. He has wheezes in the right upper field, the right middle field, the right lower field,  the left upper field, the left middle field and the left lower field. He has no rales. He exhibits no tenderness.  Musculoskeletal: Normal range of motion.  Lymphadenopathy:    He has no cervical adenopathy.  Neurological: He is alert and oriented to person, place, and time.  Skin: Skin is warm, dry and intact. No rash noted. He is not diaphoretic. No erythema. No pallor.  Psychiatric: He has a normal  mood and affect. His speech is normal and behavior is normal. Judgment and thought content normal. Cognition and memory are normal.  Nursing note and vitals reviewed.   Results for orders placed or performed in visit on 09/25/17  Comprehensive metabolic panel  Result Value Ref Range   Glucose 89 65 - 99 mg/dL   BUN 27 8 - 27 mg/dL   Creatinine, Ser 2.01 (H) 0.76 - 1.27 mg/dL   GFR calc non Af Amer 35 (L) >59 mL/min/1.73   GFR calc Af Amer 40 (L) >59 mL/min/1.73   BUN/Creatinine Ratio 13 10 - 24   Sodium 138 134 - 144 mmol/L   Potassium 5.3 (H) 3.5 - 5.2 mmol/L   Chloride 105 96 - 106 mmol/L   CO2 17 (L) 20 - 29 mmol/L   Calcium 9.0 8.6 - 10.2 mg/dL   Total Protein 6.6 6.0 - 8.5 g/dL   Albumin 4.6 3.6 - 4.8 g/dL   Globulin, Total 2.0 1.5 - 4.5 g/dL   Albumin/Globulin Ratio 2.3 (H) 1.2 - 2.2   Bilirubin Total 0.3 0.0 - 1.2 mg/dL   Alkaline Phosphatase 74 39 - 117 IU/L   AST 18 0 - 40 IU/L   ALT 10 0 - 44 IU/L  Uric acid  Result Value Ref Range   Uric Acid 8.2 3.7 - 8.6 mg/dL      Assessment & Plan:   Problem List Items Addressed This Visit    None    Visit Diagnoses    Acute bronchitis, unspecified organism    -  Primary   Will treat with 12 day prednisone taper and z-pack. Recheck lungs 2 weeks. Call with any concerns or if not getting better.        Follow up plan: Return 2 weeks, for lung recheck.

## 2017-10-14 ENCOUNTER — Telehealth: Payer: Self-pay

## 2017-10-14 NOTE — Telephone Encounter (Signed)
Copied from [place]. Topic: General - Other >> Oct 14, 2017 11:07 AM Carolyn Stare wrote:  Pt wife she said she thought the doctor told her husband not to no longer take the below med and she is asking if this true .   hydrochlorothiazide (HYDRODIURIL) 25 MG tablet  [phone]     Routing to provider to address. Did not see anything in OV note about stopping medication.

## 2017-10-15 NOTE — Telephone Encounter (Signed)
He is not to be taking that. We got a refill request from the pharmacy. Please have the pharmacy cancel this Rx

## 2017-10-16 NOTE — Telephone Encounter (Signed)
Called and let patient's wife know that the patient should not be taking that medication per Dr. Wynetta Emery. RX discontinued at the patient's pharmacy as well.

## 2017-10-28 ENCOUNTER — Telehealth: Payer: Self-pay | Admitting: Family Medicine

## 2017-10-28 MED ORDER — PREDNISONE 10 MG PO TABS: ORAL_TABLET | ORAL | 0 refills | Status: DC

## 2017-10-28 NOTE — Telephone Encounter (Signed)
Spoke with patient's wife, he is having a gout flare due to drinking beer over the weekend. Patient has an appointment scheduled for Monday.

## 2017-10-28 NOTE — Telephone Encounter (Signed)
Copied from [place] [id number]. Topic: Quick Communication - Rx Refill/Question >> Oct 28, 2017  8:22 AM Lolita Rieger, RMA wrote: Medication: prednisone 10mg    Has the patient contacted their pharmacy? no   (Agent: If no, request that the patient contact the pharmacy for the refill.)   Preferred Pharmacy (with phone number or street name): [facility] in [place]: Please be advised that RX refills may take up to 3 business days. We ask that you follow-up with your pharmacy.   Pt wife would also like a call from the nurse

## 2017-10-28 NOTE — Telephone Encounter (Signed)
Rx sent to her pharmacy 

## 2017-10-28 NOTE — Telephone Encounter (Signed)
This is not an appropriate refill. If he is not better, he will need to be seen.

## 2017-10-30 DIAGNOSIS — N2581 Secondary hyperparathyroidism of renal origin: Secondary | ICD-10-CM | POA: Diagnosis not present

## 2017-10-30 DIAGNOSIS — D631 Anemia in chronic kidney disease: Secondary | ICD-10-CM | POA: Diagnosis not present

## 2017-10-30 DIAGNOSIS — I10 Essential (primary) hypertension: Secondary | ICD-10-CM | POA: Diagnosis not present

## 2017-10-30 DIAGNOSIS — N183 Chronic kidney disease, stage 3 (moderate): Secondary | ICD-10-CM | POA: Diagnosis not present

## 2017-10-30 DIAGNOSIS — R809 Proteinuria, unspecified: Secondary | ICD-10-CM | POA: Diagnosis not present

## 2017-11-02 ENCOUNTER — Ambulatory Visit (INDEPENDENT_AMBULATORY_CARE_PROVIDER_SITE_OTHER): Payer: Medicare HMO | Admitting: Family Medicine

## 2017-11-02 ENCOUNTER — Encounter: Payer: Self-pay | Admitting: Family Medicine

## 2017-11-02 VITALS — BP 144/77 | HR 85 | Temp 97.9°F | Wt 175.6 lb

## 2017-11-02 DIAGNOSIS — J209 Acute bronchitis, unspecified: Secondary | ICD-10-CM | POA: Diagnosis not present

## 2017-11-02 DIAGNOSIS — D7581 Myelofibrosis: Secondary | ICD-10-CM

## 2017-11-02 DIAGNOSIS — M109 Gout, unspecified: Secondary | ICD-10-CM | POA: Diagnosis not present

## 2017-11-02 DIAGNOSIS — M10371 Gout due to renal impairment, right ankle and foot: Secondary | ICD-10-CM | POA: Diagnosis not present

## 2017-11-02 NOTE — Assessment & Plan Note (Signed)
Going for a scan on Wednesday. Call with any concerns. Continue to follow with heme/onc. Stable at this time.

## 2017-11-02 NOTE — Progress Notes (Signed)
BP (!) 144/77   Pulse 85   Temp 97.9 F (36.6 C) (Oral)   Wt 175 lb 9.6 oz (79.7 kg)   SpO2 99%   BMI 28.95 kg/m    Subjective:    [name], male    DOB: 19-Feb-1956, 62 y.o.   MRN: [id number]  HPI: James Arnold is a 62 y.o. male  Chief Complaint  Patient presents with  . Lung Recheck    pt states he feels a little better   Had a gout flare from drinking beer over the weekend. He called and got a 2nd round of prednisone. He is feeling better after the prednisone. Saw nephrology on Friday and everything seems to be stable.   Breathing seems better. He notices that he's tired, but otherwise feeling well. No other concerns or complaints at this time.  Relevant past medical, surgical, family and social history reviewed and updated as indicated. Interim medical history since our last visit reviewed. Allergies and medications reviewed and updated.  Review of Systems  Constitutional: Positive for fatigue. Negative for activity change, appetite change, chills, diaphoresis, fever and unexpected weight change.  HENT: Negative.   Respiratory: Negative.   Cardiovascular: Negative.   Musculoskeletal: Negative.   Psychiatric/Behavioral: Negative.     Per HPI unless specifically indicated above     Objective:    BP (!) 144/77   Pulse 85   Temp 97.9 F (36.6 C) (Oral)   Wt 175 lb 9.6 oz (79.7 kg)   SpO2 99%   BMI 28.95 kg/m   Wt Readings from Last 3 Encounters:  11/02/17 175 lb 9.6 oz (79.7 kg)  10/13/17 176 lb 4.8 oz (80 kg)  09/25/17 178 lb 5 oz (80.9 kg)    Physical Exam  Constitutional: He is oriented to person, place, and time. He appears well-developed and well-nourished. No distress.  HENT:  Head: Normocephalic and atraumatic.  Right Ear: Hearing normal.  Left Ear: Hearing normal.  Nose: Nose normal.  Eyes: Conjunctivae and lids are normal. Right eye exhibits no discharge. Left eye exhibits no discharge. No scleral icterus.  Cardiovascular:  Normal rate, regular rhythm, normal heart sounds and intact distal pulses. Exam reveals no gallop and no friction rub.  No murmur heard. Pulmonary/Chest: Effort normal and breath sounds normal. No respiratory distress. He has no wheezes. He has no rales. He exhibits no tenderness.  Musculoskeletal: Normal range of motion.  Neurological: He is alert and oriented to person, place, and time.  Skin: Skin is warm, dry and intact. No rash noted. He is not diaphoretic. No erythema. No pallor.  Psychiatric: He has a normal mood and affect. His speech is normal and behavior is normal. Judgment and thought content normal. Cognition and memory are normal.  Nursing note and vitals reviewed.   Results for orders placed or performed in visit on 09/25/17  Comprehensive metabolic panel  Result Value Ref Range   Glucose 89 65 - 99 mg/dL   BUN 27 8 - 27 mg/dL   Creatinine, Ser 2.01 (H) 0.76 - 1.27 mg/dL   GFR calc non Af Amer 35 (L) >59 mL/min/1.73   GFR calc Af Amer 40 (L) >59 mL/min/1.73   BUN/Creatinine Ratio 13 10 - 24   Sodium 138 134 - 144 mmol/L   Potassium 5.3 (H) 3.5 - 5.2 mmol/L   Chloride 105 96 - 106 mmol/L   CO2 17 (L) 20 - 29 mmol/L   Calcium 9.0 8.6 - 10.2 mg/dL  Total Protein 6.6 6.0 - 8.5 g/dL   Albumin 4.6 3.6 - 4.8 g/dL   Globulin, Total 2.0 1.5 - 4.5 g/dL   Albumin/Globulin Ratio 2.3 (H) 1.2 - 2.2   Bilirubin Total 0.3 0.0 - 1.2 mg/dL   Alkaline Phosphatase 74 39 - 117 IU/L   AST 18 0 - 40 IU/L   ALT 10 0 - 44 IU/L  Uric acid  Result Value Ref Range   Uric Acid 8.2 3.7 - 8.6 mg/dL      Assessment & Plan:   Problem List Items Addressed This Visit      Other   Myelofibrosis ([place])    Going for a scan on Wednesday. Call with any concerns. Continue to follow with heme/onc. Stable at this time.       Gout - Primary    Rechecking labs today. Feeling better. No other concerns today.      Relevant Orders   Uric acid    Other Visit Diagnoses    Acute bronchitis,  unspecified organism       Resolved. Lungs clear today. Call with any concerns.        Follow up plan: Return As scheduled.

## 2017-11-02 NOTE — Assessment & Plan Note (Signed)
Rechecking labs today. Feeling better. No other concerns today.

## 2017-11-03 ENCOUNTER — Telehealth: Payer: Self-pay | Admitting: Family Medicine

## 2017-11-03 LAB — URIC ACID: URIC ACID: 8.7 mg/dL — AB (ref 3.7–8.6)

## 2017-11-03 NOTE — Telephone Encounter (Signed)
Please let him know that his uric acid is still high, avoid the beer and the other triggers and we'll keep an eye on it. Thanks!

## 2017-11-03 NOTE — Telephone Encounter (Signed)
Patient wife notified.

## 2017-11-04 ENCOUNTER — Ambulatory Visit
Admission: RE | Admit: 2017-11-04 | Discharge: 2017-11-04 | Disposition: A | Discharge status: Home / Self Care | Payer: Medicare HMO | Source: Ambulatory Visit | Attending: Internal Medicine | Admitting: Internal Medicine

## 2017-11-04 DIAGNOSIS — D7581 Myelofibrosis: Secondary | ICD-10-CM | POA: Insufficient documentation

## 2017-11-04 DIAGNOSIS — R161 Splenomegaly, not elsewhere classified: Secondary | ICD-10-CM | POA: Insufficient documentation

## 2017-11-16 ENCOUNTER — Inpatient Hospital Stay: Payer: Medicare HMO | Attending: Internal Medicine

## 2017-11-16 ENCOUNTER — Encounter: Payer: Self-pay | Admitting: Internal Medicine

## 2017-11-16 ENCOUNTER — Telehealth: Payer: Self-pay | Admitting: Internal Medicine

## 2017-11-16 ENCOUNTER — Inpatient Hospital Stay (HOSPITAL_BASED_OUTPATIENT_CLINIC_OR_DEPARTMENT_OTHER): Payer: Medicare HMO | Admitting: Internal Medicine

## 2017-11-16 VITALS — BP 130/80 | HR 80 | Temp 97.9°F | Resp 16 | Wt 176.0 lb

## 2017-11-16 DIAGNOSIS — R5383 Other fatigue: Secondary | ICD-10-CM | POA: Insufficient documentation

## 2017-11-16 DIAGNOSIS — Z801 Family history of malignant neoplasm of trachea, bronchus and lung: Secondary | ICD-10-CM | POA: Insufficient documentation

## 2017-11-16 DIAGNOSIS — Z79899 Other long term (current) drug therapy: Secondary | ICD-10-CM

## 2017-11-16 DIAGNOSIS — I2510 Atherosclerotic heart disease of native coronary artery without angina pectoris: Secondary | ICD-10-CM

## 2017-11-16 DIAGNOSIS — Z87891 Personal history of nicotine dependence: Secondary | ICD-10-CM

## 2017-11-16 DIAGNOSIS — D696 Thrombocytopenia, unspecified: Secondary | ICD-10-CM | POA: Diagnosis not present

## 2017-11-16 DIAGNOSIS — D649 Anemia, unspecified: Secondary | ICD-10-CM

## 2017-11-16 DIAGNOSIS — D471 Chronic myeloproliferative disease: Secondary | ICD-10-CM | POA: Insufficient documentation

## 2017-11-16 DIAGNOSIS — I129 Hypertensive chronic kidney disease with stage 1 through stage 4 chronic kidney disease, or unspecified chronic kidney disease: Secondary | ICD-10-CM | POA: Insufficient documentation

## 2017-11-16 DIAGNOSIS — E785 Hyperlipidemia, unspecified: Secondary | ICD-10-CM | POA: Insufficient documentation

## 2017-11-16 DIAGNOSIS — F329 Major depressive disorder, single episode, unspecified: Secondary | ICD-10-CM | POA: Diagnosis not present

## 2017-11-16 DIAGNOSIS — N183 Chronic kidney disease, stage 3 (moderate): Secondary | ICD-10-CM | POA: Insufficient documentation

## 2017-11-16 DIAGNOSIS — Z800 Family history of malignant neoplasm of digestive organs: Secondary | ICD-10-CM | POA: Diagnosis not present

## 2017-11-16 DIAGNOSIS — D7581 Myelofibrosis: Secondary | ICD-10-CM

## 2017-11-16 DIAGNOSIS — F17210 Nicotine dependence, cigarettes, uncomplicated: Secondary | ICD-10-CM | POA: Diagnosis not present

## 2017-11-16 DIAGNOSIS — R918 Other nonspecific abnormal finding of lung field: Secondary | ICD-10-CM | POA: Insufficient documentation

## 2017-11-16 DIAGNOSIS — I10 Essential (primary) hypertension: Secondary | ICD-10-CM

## 2017-11-16 DIAGNOSIS — Z803 Family history of malignant neoplasm of breast: Secondary | ICD-10-CM | POA: Diagnosis not present

## 2017-11-16 DIAGNOSIS — Z7982 Long term (current) use of aspirin: Secondary | ICD-10-CM | POA: Insufficient documentation

## 2017-11-16 DIAGNOSIS — Z8041 Family history of malignant neoplasm of ovary: Secondary | ICD-10-CM

## 2017-11-16 DIAGNOSIS — N179 Acute kidney failure, unspecified: Secondary | ICD-10-CM

## 2017-11-16 LAB — COMPREHENSIVE METABOLIC PANEL
ALBUMIN: 3.9 g/dL (ref 3.5–5.0)
ALT: 11 U/L — ABNORMAL LOW (ref 17–63)
AST: 17 U/L (ref 15–41)
Alkaline Phosphatase: 56 U/L (ref 38–126)
Anion gap: 3 — ABNORMAL LOW (ref 5–15)
BUN: 30 mg/dL — AB (ref 6–20)
CHLORIDE: 110 mmol/L (ref 101–111)
CO2: 22 mmol/L (ref 22–32)
Calcium: 8.7 mg/dL — ABNORMAL LOW (ref 8.9–10.3)
Creatinine, Ser: 2.11 mg/dL — ABNORMAL HIGH (ref 0.61–1.24)
GFR calc Af Amer: 37 mL/min — ABNORMAL LOW (ref >60)
GFR calc non Af Amer: 32 mL/min — ABNORMAL LOW (ref >60)
GLUCOSE: 126 mg/dL — AB (ref 65–99)
POTASSIUM: 4.6 mmol/L (ref 3.5–5.1)
SODIUM: 135 mmol/L (ref 135–145)
Total Bilirubin: 0.5 mg/dL (ref 0.3–1.2)
Total Protein: 6.6 g/dL (ref 6.5–8.1)

## 2017-11-16 LAB — CBC WITH DIFFERENTIAL/PLATELET
Basophils Absolute: 0 10*3/uL (ref 0–0.1)
Basophils Relative: 0 %
EOS PCT: 1 %
Eosinophils Absolute: 0 10*3/uL (ref 0–0.7)
HEMATOCRIT: 30.4 % — AB (ref 40.0–52.0)
Hemoglobin: 10 g/dL — ABNORMAL LOW (ref 13.0–18.0)
LYMPHS ABS: 0.6 10*3/uL — AB (ref 1.0–3.6)
LYMPHS PCT: 15 %
MCH: 28.1 pg (ref 26.0–34.0)
MCHC: 32.8 g/dL (ref 32.0–36.0)
MCV: 85.5 fL (ref 80.0–100.0)
MONO ABS: 0.2 10*3/uL (ref 0.2–1.0)
Monocytes Relative: 4 %
Neutro Abs: 3 10*3/uL (ref 1.4–6.5)
Neutrophils Relative %: 80 %
PLATELETS: 154 10*3/uL (ref 150–440)
RBC: 3.55 MIL/uL — ABNORMAL LOW (ref 4.40–5.90)
RDW: 19.1 % — AB (ref 11.5–14.5)
WBC: 3.8 10*3/uL (ref 3.8–10.6)

## 2017-11-16 LAB — LACTATE DEHYDROGENASE: LDH: 585 U/L — AB (ref 98–192)

## 2017-11-16 NOTE — Progress Notes (Signed)
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 [facility] 2019- kidney Bx-pending; Dr.[name]]  # hx of Lung nodules- resolved [[name]]/quit smoking.    No history exists.     INTERVAL HISTORY:  A  62 year old male patient with above history of primary myelofibrosis diagnosed in 2010 is here for follow-up.   Patient states that he has been very tired over the last 2-3 months.  Appetite is fair.  No significant weight loss.  He also had episode of night sweats approximately a month ago.  Denies any fevers or chills.  He also has been complaining of intermittent headaches as per the wife.   As per the family noted to have proteinuria when being worked up by nephrology for his chronic kidney disease.  He is awaiting to have a kidney biopsy.    REVIEW OF SYSTEMS:  A complete 10 point review of system is done which is negative except mentioned above/history of present illness.   PAST MEDICAL HISTORY :  Past Medical History:  Diagnosis Date  . Anemia   . Benign hypertensive renal disease   . CAD (coronary artery disease) 08/1996   Stent placed  . Chronic kidney disease, stage II (mild)   . Depression   . Hyperlipidemia 10/2002  . Hypertension   . Myelofibrosis ([place]) 2010  . Smoker   . Thrombocytopenia ([place])     PAST SURGICAL HISTORY :   Past Surgical History:  Procedure Laterality Date  . ANGIOPLASTY    . BONE MARROW ASPIRATION  03/2009  . CORONARY STENT PLACEMENT  08/1996    FAMILY HISTORY :   Family  History  Problem Relation Age of Onset  . Stroke Mother        Low BP stroke  . Depression Father   . Depression Sister        Breast  . Heart disease Unknown   . Breast cancer Unknown   . Lung cancer Unknown   . Ovarian cancer Unknown   . Stomach cancer Unknown     SOCIAL HISTORY:   Social History   Tobacco Use  . Smoking status: Current Every Day Smoker    Packs/day: 1.00    Types: Cigarettes  . Smokeless tobacco: Never Used  Substance Use Topics  . Alcohol use: No    Alcohol/week: 0.0 oz    Frequency: Never    Comment: patient states he has not consumed alcohol in the last month  . Drug use: No    ALLERGIES:  has No Known Allergies.  MEDICATIONS:  Current Outpatient Medications  Medication Sig Dispense Refill  . allopurinol (ZYLOPRIM) 100 MG tablet Take 0.5 tablets (50 mg total) by mouth daily. 45 tablet 3  . amLODipine (NORVASC) 5 MG tablet TK 1 T PO ONCE A DAY  12  . aspirin 325 MG tablet Take 325 mg by mouth daily.    . citalopram (CELEXA) 20 MG tablet Take 1 tablet (20 mg total) by mouth daily. 90 tablet 1  . lisinopril (PRINIVIL,ZESTRIL) 20 MG tablet Take 1 tablet (20 mg total) by mouth  daily. 90 tablet 1  . hydrochlorothiazide (HYDRODIURIL) 25 MG tablet TAKE 1 TABLET(25 MG) BY MOUTH DAILY (Patient not taking: Reported on 11/16/2017) 90 tablet 0   No current facility-administered medications for this visit.     PHYSICAL EXAMINATION: ECOG PERFORMANCE STATUS: 0 - Asymptomatic  BP 130/80 (BP Location: Left Arm, Patient Position: Sitting)   Pulse 80   Temp 97.9 F (36.6 C) (Tympanic)   Resp 16   Wt 176 lb (79.8 kg)   BMI 29.02 kg/m   Filed Weights   11/16/17 1401 11/16/17 1410  Weight: 176 lb (79.8 kg) 176 lb (79.8 kg)    GENERAL: Well-nourished well-developed; Alert, no distress and comfortable.    Accompanied by his wife. EYES: no pallor or icterus OROPHARYNX: no thrush or ulceration; good dentition  NECK: supple, no masses felt LYMPH:  no  palpable lymphadenopathy in the cervical, axillary or inguinal regions LUNGS: clear to auscultation and  No wheeze or crackles HEART/CVS: regular rate & rhythm and no murmurs; No lower extremity edema;  Positive for splenomegaly.  ABDOMEN:abdomen soft, non-tender and normal bowel sounds Musculoskeletal:no cyanosis of digits and no clubbing  PSYCH: alert & oriented x 3 with fluent speech NEURO: no focal motor/sensory deficits SKIN:  no rashes or significant lesions  LABORATORY DATA:  I have reviewed the data as listed    Component Value Date/Time   NA 135 11/16/2017 1317   NA 138 09/25/2017 1045   K 4.6 11/16/2017 1317   CL 110 11/16/2017 1317   CO2 22 11/16/2017 1317   GLUCOSE 126 (H) 11/16/2017 1317   BUN 30 (H) 11/16/2017 1317   BUN 27 09/25/2017 1045   CREATININE 2.11 (H) 11/16/2017 1317   CREATININE 1.12 09/30/2013 0953   CALCIUM 8.7 (L) 11/16/2017 1317   PROT 6.6 11/16/2017 1317   PROT 6.6 09/25/2017 1045   ALBUMIN 3.9 11/16/2017 1317   ALBUMIN 4.6 09/25/2017 1045   AST 17 11/16/2017 1317   ALT 11 (L) 11/16/2017 1317   ALKPHOS 56 11/16/2017 1317   BILITOT 0.5 11/16/2017 1317   BILITOT 0.3 09/25/2017 1045   GFRNONAA 32 (L) 11/16/2017 1317   GFRNONAA >60 09/30/2013 0953   GFRAA 37 (L) 11/16/2017 1317   GFRAA >60 09/30/2013 0953    No results found for: SPEP, UPEP  Lab Results  Component Value Date   WBC 3.8 11/16/2017   NEUTROABS 3.0 11/16/2017   HGB 10.0 (L) 11/16/2017   HCT 30.4 (L) 11/16/2017   MCV 85.5 11/16/2017   PLT 154 11/16/2017      Chemistry      Component Value Date/Time   NA 135 11/16/2017 1317   NA 138 09/25/2017 1045   K 4.6 11/16/2017 1317   CL 110 11/16/2017 1317   CO2 22 11/16/2017 1317   BUN 30 (H) 11/16/2017 1317   BUN 27 09/25/2017 1045   CREATININE 2.11 (H) 11/16/2017 1317   CREATININE 1.12 09/30/2013 0953      Component Value Date/Time   CALCIUM 8.7 (L) 11/16/2017 1317   ALKPHOS 56 11/16/2017 1317   AST 17 11/16/2017 1317    ALT 11 (L) 11/16/2017 1317   BILITOT 0.5 11/16/2017 1317   BILITOT 0.3 09/25/2017 1045       ASSESSMENT & PLAN:    Myelofibrosis ([place]) # 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  Currently on surveillance.  Patient's CBC - stable except for mild anemia, hemoglobin 11.9/thrombocytopenia- 130s; LDH stable ~500s.   #Interestingly patient's ultrasound-showed  improvement in the size of the spleen/splenic volume.  However patient complains of worsening fatigue night sweats/overall feeling poorly-question manifestation of progression of myelofibrosis [although LDH/rest of the labs is fairly steady].   # Mild anemia-slightly worse at 10.?  Question CKD versus underlying bone marrow problem.  #CKD stage III  [Dr. Latif]creatinine continuing to increase, currently 2.17/getting worse. Likely unrelated to myelofibrosis. Awaiting kidney biopsy- sec to proteinuria.   #Fatigue -unclear etiology myelofibrosis versus anemia versus others.  Check TSH today; follow up in 1 month/ labs.   # Smoking cessation discussed- patient not interested in quitting.  #Follow-up in 1 month; labs. Add TSH/iron studies/ferritin.       Cammie Sickle, MD 11/16/2017 5:02 PM

## 2017-11-16 NOTE — Assessment & Plan Note (Addendum)
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  Currently on surveillance.  Patient's CBC - stable except for mild anemia, hemoglobin 11.9/thrombocytopenia- 130s; LDH stable ~500s.   #Interestingly patient's ultrasound-showed improvement in the size of the spleen/splenic volume.  However patient complains of worsening fatigue night sweats/overall feeling poorly-question manifestation of progression of myelofibrosis [although LDH/rest of the labs is fairly steady].   # Mild anemia-slightly worse at 10.?  Question CKD versus underlying bone marrow problem.  #CKD stage III  [Dr. Latif]creatinine continuing to increase, currently 2.17/getting worse. Likely unrelated to myelofibrosis. Awaiting kidney biopsy- sec to proteinuria.   #Fatigue -unclear etiology myelofibrosis versus anemia versus others.  Check TSH today; follow up in 1 month/ labs.   # Smoking cessation discussed- patient not interested in quitting.  #Follow-up in 1 month; labs. Add TSH/iron studies/ferritin.

## 2017-11-16 NOTE — Telephone Encounter (Signed)
Add TSH/iron studies/ferritin to labs from 2/25.  Add TSH/iron studies/ferritin. [If labs cannot be added; have been drawn at next visit with other labs]  Follow up with me in 1 month- cbc/cmp/ldh.

## 2017-11-17 ENCOUNTER — Other Ambulatory Visit: Payer: Self-pay | Admitting: *Deleted

## 2017-11-17 DIAGNOSIS — R5383 Other fatigue: Secondary | ICD-10-CM

## 2017-11-17 DIAGNOSIS — D7581 Myelofibrosis: Secondary | ICD-10-CM

## 2017-11-17 LAB — FERRITIN: FERRITIN: 202 ng/mL (ref 24–336)

## 2017-11-17 LAB — IRON AND TIBC
Iron: 91 ug/dL (ref 45–182)
Saturation Ratios: 34 % (ref 17.9–39.5)
TIBC: 270 ug/dL (ref 250–450)
UIBC: 179 ug/dL

## 2017-11-17 LAB — TSH: TSH: 1.922 u[IU]/mL (ref 0.350–4.500)

## 2017-11-17 NOTE — Telephone Encounter (Signed)
Lab orders entered. I spoke with Josh in lab. He will verify labs.

## 2017-11-17 NOTE — Addendum Note (Signed)
Addended by: Sabino Gasser on: 11/17/2017 09:20 AM   Modules accepted: Orders

## 2017-11-17 NOTE — Addendum Note (Signed)
Addended by: Sabino Gasser on: 11/17/2017 09:17 AM   Modules accepted: Orders

## 2017-11-23 ENCOUNTER — Other Ambulatory Visit
Admission: RE | Admit: 2017-11-23 | Discharge: 2017-11-23 | Disposition: A | Discharge status: Home / Self Care | Payer: Medicare HMO | Source: Ambulatory Visit | Attending: Nephrology | Admitting: Nephrology

## 2017-11-23 DIAGNOSIS — Z01812 Encounter for preprocedural laboratory examination: Secondary | ICD-10-CM | POA: Diagnosis not present

## 2017-11-23 LAB — URINALYSIS, ROUTINE W REFLEX MICROSCOPIC
Bacteria, UA: NONE SEEN
Bilirubin Urine: NEGATIVE
GLUCOSE, UA: NEGATIVE mg/dL
HGB URINE DIPSTICK: NEGATIVE
Ketones, ur: NEGATIVE mg/dL
Leukocytes, UA: NEGATIVE
NITRITE: NEGATIVE
Protein, ur: 100 mg/dL — AB
SPECIFIC GRAVITY, URINE: 1.008 (ref 1.005–1.030)
Squamous Epithelial / LPF: NONE SEEN
pH: 5 (ref 5.0–8.0)

## 2017-11-23 LAB — CBC WITH DIFFERENTIAL/PLATELET
BASOS PCT: 0 %
Basophils Absolute: 0 10*3/uL (ref 0–0.1)
EOS ABS: 0 10*3/uL (ref 0–0.7)
Eosinophils Relative: 0 %
HCT: 31.3 % — ABNORMAL LOW (ref 40.0–52.0)
Hemoglobin: 10.6 g/dL — ABNORMAL LOW (ref 13.0–18.0)
Lymphocytes Relative: 14 %
Lymphs Abs: 0.5 10*3/uL — ABNORMAL LOW (ref 1.0–3.6)
MCH: 27.6 pg (ref 26.0–34.0)
MCHC: 33.8 g/dL (ref 32.0–36.0)
MCV: 81.7 fL (ref 80.0–100.0)
MONO ABS: 0.2 10*3/uL (ref 0.2–1.0)
MONOS PCT: 4 %
NEUTROS PCT: 82 %
Neutro Abs: 2.9 10*3/uL (ref 1.4–6.5)
Platelets: 156 10*3/uL (ref 150–440)
RBC: 3.83 MIL/uL — ABNORMAL LOW (ref 4.40–5.90)
RDW: 19.5 % — AB (ref 11.5–14.5)
WBC: 3.6 10*3/uL — ABNORMAL LOW (ref 3.8–10.6)

## 2017-11-23 LAB — PROTIME-INR
INR: 1.07
PROTHROMBIN TIME: 13.8 s (ref 11.4–15.2)

## 2017-11-23 LAB — TYPE AND SCREEN
ABO/RH(D): A POS
Antibody Screen: NEGATIVE

## 2017-11-23 LAB — COMPREHENSIVE METABOLIC PANEL
ALT: 12 U/L — ABNORMAL LOW (ref 17–63)
AST: 17 U/L (ref 15–41)
Albumin: 4.3 g/dL (ref 3.5–5.0)
Alkaline Phosphatase: 62 U/L (ref 38–126)
Anion gap: 5 (ref 5–15)
BILIRUBIN TOTAL: 1 mg/dL (ref 0.3–1.2)
BUN: 32 mg/dL — ABNORMAL HIGH (ref 6–20)
CO2: 22 mmol/L (ref 22–32)
CREATININE: 2 mg/dL — AB (ref 0.61–1.24)
Calcium: 8.9 mg/dL (ref 8.9–10.3)
Chloride: 109 mmol/L (ref 101–111)
GFR, EST AFRICAN AMERICAN: 40 mL/min — AB (ref >60)
GFR, EST NON AFRICAN AMERICAN: 34 mL/min — AB (ref >60)
Glucose, Bld: 97 mg/dL (ref 65–99)
POTASSIUM: 4.6 mmol/L (ref 3.5–5.1)
Sodium: 136 mmol/L (ref 135–145)
TOTAL PROTEIN: 6.9 g/dL (ref 6.5–8.1)

## 2017-11-24 LAB — PROTEIN / CREATININE RATIO, URINE
Creatinine, Urine: 56 mg/dL
Protein Creatinine Ratio: 3.91 mg/mg{Cre} — ABNORMAL HIGH (ref 0.00–0.15)
Total Protein, Urine: 219 mg/dL

## 2017-11-25 ENCOUNTER — Observation Stay
Admission: AD | Admit: 2017-11-25 | Discharge: 2017-11-26 | Disposition: A | Discharge status: Home / Self Care | Payer: Medicare HMO | Source: Ambulatory Visit | Attending: Nephrology | Admitting: Nephrology

## 2017-11-25 ENCOUNTER — Observation Stay: Payer: Medicare HMO

## 2017-11-25 ENCOUNTER — Other Ambulatory Visit: Payer: Self-pay

## 2017-11-25 DIAGNOSIS — D7581 Myelofibrosis: Secondary | ICD-10-CM | POA: Diagnosis not present

## 2017-11-25 DIAGNOSIS — F17200 Nicotine dependence, unspecified, uncomplicated: Secondary | ICD-10-CM | POA: Insufficient documentation

## 2017-11-25 DIAGNOSIS — E785 Hyperlipidemia, unspecified: Secondary | ICD-10-CM | POA: Diagnosis not present

## 2017-11-25 DIAGNOSIS — Z79899 Other long term (current) drug therapy: Secondary | ICD-10-CM | POA: Diagnosis not present

## 2017-11-25 DIAGNOSIS — I2510 Atherosclerotic heart disease of native coronary artery without angina pectoris: Secondary | ICD-10-CM | POA: Diagnosis not present

## 2017-11-25 DIAGNOSIS — I129 Hypertensive chronic kidney disease with stage 1 through stage 4 chronic kidney disease, or unspecified chronic kidney disease: Principal | ICD-10-CM | POA: Insufficient documentation

## 2017-11-25 DIAGNOSIS — F329 Major depressive disorder, single episode, unspecified: Secondary | ICD-10-CM | POA: Diagnosis not present

## 2017-11-25 DIAGNOSIS — R809 Proteinuria, unspecified: Secondary | ICD-10-CM | POA: Diagnosis not present

## 2017-11-25 DIAGNOSIS — D631 Anemia in chronic kidney disease: Secondary | ICD-10-CM | POA: Diagnosis not present

## 2017-11-25 DIAGNOSIS — I120 Hypertensive chronic kidney disease with stage 5 chronic kidney disease or end stage renal disease: Secondary | ICD-10-CM | POA: Diagnosis not present

## 2017-11-25 DIAGNOSIS — Z7982 Long term (current) use of aspirin: Secondary | ICD-10-CM | POA: Diagnosis not present

## 2017-11-25 DIAGNOSIS — N189 Chronic kidney disease, unspecified: Secondary | ICD-10-CM | POA: Diagnosis not present

## 2017-11-25 DIAGNOSIS — N041 Nephrotic syndrome with focal and segmental glomerular lesions: Secondary | ICD-10-CM | POA: Diagnosis not present

## 2017-11-25 DIAGNOSIS — N183 Chronic kidney disease, stage 3 (moderate): Secondary | ICD-10-CM | POA: Insufficient documentation

## 2017-11-25 LAB — CBC
HEMATOCRIT: 29.1 % — AB (ref 40.0–52.0)
Hemoglobin: 9.6 g/dL — ABNORMAL LOW (ref 13.0–18.0)
MCH: 27.8 pg (ref 26.0–34.0)
MCHC: 33 g/dL (ref 32.0–36.0)
MCV: 84.3 fL (ref 80.0–100.0)
Platelets: 125 10*3/uL — ABNORMAL LOW (ref 150–440)
RBC: 3.45 MIL/uL — ABNORMAL LOW (ref 4.40–5.90)
RDW: 19.4 % — AB (ref 11.5–14.5)
WBC: 3.5 10*3/uL — ABNORMAL LOW (ref 3.8–10.6)

## 2017-11-25 MED ORDER — OXYCODONE-ACETAMINOPHEN 5-325 MG PO TABS
1.0000 | ORAL_TABLET | ORAL | Status: DC | PRN
Start: 2017-11-25 — End: 2017-11-26

## 2017-11-25 MED ORDER — ACETAMINOPHEN 500 MG PO TABS
500.0000 mg | ORAL_TABLET | Freq: Four times a day (QID) | ORAL | Status: DC | PRN
  Administered 2017-11-25: 1000 mg via ORAL
  Filled 2017-11-25: qty 2

## 2017-11-25 MED ORDER — ACETAMINOPHEN-CODEINE #3 300-30 MG PO TABS: 1.0000 | ORAL_TABLET | ORAL | Status: DC | PRN

## 2017-11-25 NOTE — Progress Notes (Signed)
Per MD okay for RN to put diet order.

## 2017-11-25 NOTE — Procedures (Signed)
After obtaining informed consent, the patient was brought down to the ultrasound suite. Subsequently the left kidney was identified under ultrasound. The left flank was prepped and draped in standard sterile fashion. Local anesthesia was achieved using 1% lidocaine. Subsequently using an 18-gauge biopsy device and ultraound guidance, a total of 3 passes were made into the left kidney. The specimens were submitted to pathology and deemed to be adequate for submission. No active bleeding noted on post-biopsy images.   The patient tolerated the procedure very well. He will return to his room for continued observation.   Estimated blood loss: minimal.  Complications: Small perinephric hematoma on post biopsy images, not actively bleeding.

## 2017-11-26 ENCOUNTER — Other Ambulatory Visit: Payer: Self-pay | Admitting: Family Medicine

## 2017-11-26 DIAGNOSIS — D631 Anemia in chronic kidney disease: Secondary | ICD-10-CM | POA: Diagnosis not present

## 2017-11-26 DIAGNOSIS — F329 Major depressive disorder, single episode, unspecified: Secondary | ICD-10-CM | POA: Diagnosis not present

## 2017-11-26 DIAGNOSIS — I2510 Atherosclerotic heart disease of native coronary artery without angina pectoris: Secondary | ICD-10-CM | POA: Diagnosis not present

## 2017-11-26 DIAGNOSIS — E785 Hyperlipidemia, unspecified: Secondary | ICD-10-CM | POA: Diagnosis not present

## 2017-11-26 DIAGNOSIS — I129 Hypertensive chronic kidney disease with stage 1 through stage 4 chronic kidney disease, or unspecified chronic kidney disease: Secondary | ICD-10-CM | POA: Diagnosis not present

## 2017-11-26 DIAGNOSIS — D7581 Myelofibrosis: Secondary | ICD-10-CM | POA: Diagnosis not present

## 2017-11-26 DIAGNOSIS — R809 Proteinuria, unspecified: Secondary | ICD-10-CM | POA: Diagnosis not present

## 2017-11-26 DIAGNOSIS — Z7982 Long term (current) use of aspirin: Secondary | ICD-10-CM | POA: Diagnosis not present

## 2017-11-26 DIAGNOSIS — N183 Chronic kidney disease, stage 3 (moderate): Secondary | ICD-10-CM | POA: Diagnosis not present

## 2017-11-26 LAB — CBC
HEMATOCRIT: 29.8 % — AB (ref 40.0–52.0)
Hemoglobin: 9.7 g/dL — ABNORMAL LOW (ref 13.0–18.0)
MCH: 27.4 pg (ref 26.0–34.0)
MCHC: 32.7 g/dL (ref 32.0–36.0)
MCV: 83.8 fL (ref 80.0–100.0)
Platelets: 124 10*3/uL — ABNORMAL LOW (ref 150–440)
RBC: 3.55 MIL/uL — ABNORMAL LOW (ref 4.40–5.90)
RDW: 19.3 % — AB (ref 11.5–14.5)
WBC: 3.5 10*3/uL — ABNORMAL LOW (ref 3.8–10.6)

## 2017-11-26 NOTE — Progress Notes (Signed)
[name]  A and O x 4. VSS. Pt tolerating diet well. No complaints of pain or nausea. IV removed intact, no new prescriptions given. Pt voiced understanding of discharge instructions with no further questions. Pt discharged via wheelchair with axillary.  Lynann Bologna MSN, RN-BC  Allergies as of 11/26/2017   No Known Allergies     Medication List    STOP taking these medications   aspirin 325 MG tablet   hydrochlorothiazide 25 MG tablet Commonly known as:  HYDRODIURIL     TAKE these medications   allopurinol 100 MG tablet Commonly known as:  ZYLOPRIM Take 0.5 tablets (50 mg total) by mouth daily.   amLODipine 5 MG tablet Commonly known as:  NORVASC TK 1 T PO ONCE A DAY   citalopram 20 MG tablet Commonly known as:  CELEXA Take 1 tablet (20 mg total) by mouth daily.   lisinopril 20 MG tablet Commonly known as:  PRINIVIL,ZESTRIL Take 1 tablet (20 mg total) by mouth daily.       Vitals:   11/25/17 2037 11/26/17 0508  BP: (!) 142/67 137/60  Pulse: 83 73  Resp: 18 18  Temp: 98.2 F (36.8 C) 97.8 F (36.6 C)  SpO2: 97% 97%

## 2017-11-26 NOTE — Discharge Summary (Signed)
[facility]  ROUNDING NOTE   Subjective:  Patient doing quite well post renal biopsy. Denies hematuria. Denies flank pain.   Objective:  Vital signs in last 24 hours:  Temp:  [97.6 F (36.4 C)-98.4 F (36.9 C)] 97.8 F (36.6 C) (03/07 0508) Pulse Rate:  [73-83] 73 (03/07 0508) Resp:  [16-18] 18 (03/07 0508) BP: (126-142)/(60-70) 137/60 (03/07 0508) SpO2:  [95 %-97 %] 97 % (03/07 0508)  Weight change:  Filed Weights   11/25/17 0657  Weight: 76.7 kg (169 lb 1.6 oz)    Intake/Output: I/O last 3 completed shifts: In: 1200 [P.O.:1200] Out: 650 [Urine:650]   Intake/Output this shift:  No intake/output data recorded.  Physical Exam: General: No acute distress  Head: Normocephalic, atraumatic. Moist oral mucosal membranes  Eyes: Anicteric  Neck: Supple, trachea midline  Lungs:  Clear to auscultation, normal effort  Heart: S1S2 no rubs  Abdomen:  Soft, nontender, bowel sounds present  Extremities: No peripheral edema.  Neurologic: Awake, alert, following commands  Skin: No lesions       Basic Metabolic Panel: Recent Labs  Lab 11/23/17 1022  NA 136  K 4.6  CL 109  CO2 22  GLUCOSE 97  BUN 32*  CREATININE 2.00*  CALCIUM 8.9    Liver Function Tests: Recent Labs  Lab 11/23/17 1022  AST 17  ALT 12*  ALKPHOS 62  BILITOT 1.0  PROT 6.9  ALBUMIN 4.3   No results for input(s): LIPASE, AMYLASE in the last 168 hours. No results for input(s): AMMONIA in the last 168 hours.  CBC: Recent Labs  Lab 11/23/17 1022 11/25/17 1642 11/26/17 0338  WBC 3.6* 3.5* 3.5*  NEUTROABS 2.9  --   --   HGB 10.6* 9.6* 9.7*  HCT 31.3* 29.1* 29.8*  MCV 81.7 84.3 83.8  PLT 156 125* 124*    Cardiac Enzymes: No results for input(s): CKTOTAL, CKMB, CKMBINDEX, TROPONINI in the last 168 hours.  BNP: Invalid input(s): POCBNP  CBG: No results for input(s): GLUCAP in the last 168 hours.  Microbiology: Results for orders placed or performed in visit on  03/05/17  Microscopic Examination     Status: None   Collection Time: 03/05/17 10:13 AM  Result Value Ref Range Status   WBC, UA 0-5 0 - 5 /hpf Final   RBC, UA None seen 0 - 2 /hpf Final   Epithelial Cells (non renal) 0-10 0 - 10 /hpf Final   Bacteria, UA None seen None seen/Few Final    Coagulation Studies: Recent Labs    11/23/17 1022  LABPROT 13.8  INR 1.07    Urinalysis: Recent Labs    11/23/17 1022  COLORURINE YELLOW*  LABSPEC 1.008  PHURINE 5.0  GLUCOSEU NEGATIVE  HGBUR NEGATIVE  BILIRUBINUR NEGATIVE  KETONESUR NEGATIVE  PROTEINUR 100*  NITRITE NEGATIVE  LEUKOCYTESUR NEGATIVE      Imaging: US Biopsy-no Radiologist  Result Date: 11/25/2017 CLINICAL DATA:  Proteinuria EXAM: ULTRASOUND GUIDED CORE NEEDLE BIOPSY OF THE LEFT KIDNEY COMPARISON:  CT abdomen 10/10/2013 FINDINGS: Ultrasound-guided core biopsy of the left kidney was performed by Dr. Holley Raring without a radiologist present. No solid or cystic left renal mass.  No obstructive uropathy. Three core biopsies were were performed of the inferior pole of the left kidney. Small 1.7 x 0.7 x 1.9 cm perinephric hematoma at the site of the core biopsy. IMPRESSION: Ultrasound guided biopsy of the left kidney without a radiologist present. Small left perinephric hematoma. Electronically Signed   By: Kathreen Devoid  On: 11/25/2017 11:26     Medications:     acetaminophen, acetaminophen-codeine, oxyCODONE-acetaminophen  Assessment/ Plan:  62 y.o. male with past medical history of myelofibrosis, coronary artery disease, depression, hyperlipidemia, hypertension, chronic kidney disease stage III, and proteinuria.  1.  Chronic kidney disease stage III. 2.  Proteinuria. 3.  Myelofibrosis.  Plan: Patient underwent renal biopsy yesterday.  He tolerated this well.  No hematuria or flank pain at this time.  Okay for discharge.  No heavy lifting greater than 5 pounds for 2 weeks.  Also hold aspirin for 2 weeks.  Patient will  return to clinic as previously scheduled.   LOS: 0 James Arnold 3/7/201910:21 AM

## 2017-11-26 NOTE — Care Management Obs Status (Signed)
MEDICARE OBSERVATION STATUS NOTIFICATION   Patient Details  Name: James Arnold MRN: [id number] Date of Birth: [birth year]   Medicare Observation Status Notification Given:  No(admitted obs less than 24 hours)    Beverly Sessions, RN 11/26/2017, 10:36 AM

## 2017-11-27 ENCOUNTER — Telehealth: Payer: Self-pay

## 2017-11-27 NOTE — Telephone Encounter (Signed)
I have made the 1st attempt to contact the patient or family member in charge, in order to follow up from recently being discharged from the hospital. I left a message on voicemail but I will make another attempt at a different time.   Patient was admitted for biopsy by [facility]- Per Dr.Johnson no hospital follow up with her needed unless patient needs to come in.  Direct number given - [phone]

## 2017-11-27 NOTE — Telephone Encounter (Signed)
Spoke with patient regarding hospital follow up. pateint states he will follow up with Dr.[name] and call Dr.Johnson if needed

## 2017-12-03 ENCOUNTER — Inpatient Hospital Stay: Payer: Medicare HMO | Admitting: Family Medicine

## 2017-12-03 DIAGNOSIS — N183 Chronic kidney disease, stage 3 (moderate): Secondary | ICD-10-CM | POA: Diagnosis not present

## 2017-12-03 DIAGNOSIS — I10 Essential (primary) hypertension: Secondary | ICD-10-CM | POA: Diagnosis not present

## 2017-12-03 DIAGNOSIS — N041 Nephrotic syndrome with focal and segmental glomerular lesions: Secondary | ICD-10-CM | POA: Diagnosis not present

## 2017-12-03 DIAGNOSIS — R809 Proteinuria, unspecified: Secondary | ICD-10-CM | POA: Diagnosis not present

## 2017-12-03 LAB — SURGICAL PATHOLOGY

## 2017-12-04 ENCOUNTER — Encounter: Payer: Self-pay | Admitting: Nephrology

## 2017-12-14 ENCOUNTER — Telehealth: Payer: Self-pay | Admitting: *Deleted

## 2017-12-14 ENCOUNTER — Other Ambulatory Visit: Payer: Self-pay

## 2017-12-14 ENCOUNTER — Inpatient Hospital Stay (HOSPITAL_BASED_OUTPATIENT_CLINIC_OR_DEPARTMENT_OTHER): Payer: Medicare HMO | Admitting: Internal Medicine

## 2017-12-14 ENCOUNTER — Inpatient Hospital Stay: Payer: Medicare HMO | Attending: Internal Medicine

## 2017-12-14 ENCOUNTER — Encounter: Payer: Self-pay | Admitting: Internal Medicine

## 2017-12-14 VITALS — BP 151/74 | HR 76 | Temp 98.2°F | Wt 185.1 lb

## 2017-12-14 DIAGNOSIS — R161 Splenomegaly, not elsewhere classified: Secondary | ICD-10-CM | POA: Insufficient documentation

## 2017-12-14 DIAGNOSIS — R918 Other nonspecific abnormal finding of lung field: Secondary | ICD-10-CM | POA: Diagnosis not present

## 2017-12-14 DIAGNOSIS — E785 Hyperlipidemia, unspecified: Secondary | ICD-10-CM | POA: Insufficient documentation

## 2017-12-14 DIAGNOSIS — I129 Hypertensive chronic kidney disease with stage 1 through stage 4 chronic kidney disease, or unspecified chronic kidney disease: Secondary | ICD-10-CM | POA: Insufficient documentation

## 2017-12-14 DIAGNOSIS — D7581 Myelofibrosis: Secondary | ICD-10-CM

## 2017-12-14 DIAGNOSIS — D696 Thrombocytopenia, unspecified: Secondary | ICD-10-CM | POA: Diagnosis not present

## 2017-12-14 DIAGNOSIS — D471 Chronic myeloproliferative disease: Secondary | ICD-10-CM

## 2017-12-14 DIAGNOSIS — G4700 Insomnia, unspecified: Secondary | ICD-10-CM | POA: Diagnosis not present

## 2017-12-14 DIAGNOSIS — F329 Major depressive disorder, single episode, unspecified: Secondary | ICD-10-CM

## 2017-12-14 DIAGNOSIS — F17210 Nicotine dependence, cigarettes, uncomplicated: Secondary | ICD-10-CM | POA: Insufficient documentation

## 2017-12-14 DIAGNOSIS — I2510 Atherosclerotic heart disease of native coronary artery without angina pectoris: Secondary | ICD-10-CM

## 2017-12-14 DIAGNOSIS — Z79899 Other long term (current) drug therapy: Secondary | ICD-10-CM | POA: Diagnosis not present

## 2017-12-14 DIAGNOSIS — N181 Chronic kidney disease, stage 1: Secondary | ICD-10-CM | POA: Diagnosis not present

## 2017-12-14 LAB — COMPREHENSIVE METABOLIC PANEL
ALT: 16 U/L — ABNORMAL LOW (ref 17–63)
ANION GAP: 10 (ref 5–15)
AST: 15 U/L (ref 15–41)
Albumin: 3.2 g/dL — ABNORMAL LOW (ref 3.5–5.0)
Alkaline Phosphatase: 42 U/L (ref 38–126)
BILIRUBIN TOTAL: 0.7 mg/dL (ref 0.3–1.2)
BUN: 46 mg/dL — ABNORMAL HIGH (ref 6–20)
CO2: 18 mmol/L — ABNORMAL LOW (ref 22–32)
Calcium: 8 mg/dL — ABNORMAL LOW (ref 8.9–10.3)
Chloride: 110 mmol/L (ref 101–111)
Creatinine, Ser: 2.23 mg/dL — ABNORMAL HIGH (ref 0.61–1.24)
GFR calc non Af Amer: 30 mL/min — ABNORMAL LOW (ref >60)
GFR, EST AFRICAN AMERICAN: 35 mL/min — AB (ref >60)
Glucose, Bld: 117 mg/dL — ABNORMAL HIGH (ref 65–99)
Potassium: 4.5 mmol/L (ref 3.5–5.1)
SODIUM: 138 mmol/L (ref 135–145)
TOTAL PROTEIN: 5.6 g/dL — AB (ref 6.5–8.1)

## 2017-12-14 LAB — CBC WITH DIFFERENTIAL/PLATELET
BASOS PCT: 0 %
Basophils Absolute: 0 10*3/uL (ref 0–0.1)
EOS PCT: 0 %
Eosinophils Absolute: 0 10*3/uL (ref 0–0.7)
HEMATOCRIT: 29 % — AB (ref 40.0–52.0)
HEMOGLOBIN: 9.9 g/dL — AB (ref 13.0–18.0)
Lymphocytes Relative: 5 %
Lymphs Abs: 0.3 10*3/uL — ABNORMAL LOW (ref 1.0–3.6)
MCH: 28.9 pg (ref 26.0–34.0)
MCHC: 34.1 g/dL (ref 32.0–36.0)
MCV: 84.9 fL (ref 80.0–100.0)
Monocytes Absolute: 0.1 10*3/uL — ABNORMAL LOW (ref 0.2–1.0)
Monocytes Relative: 2 %
NEUTROS ABS: 5.5 10*3/uL (ref 1.4–6.5)
NEUTROS PCT: 93 %
Platelets: 146 10*3/uL — ABNORMAL LOW (ref 150–400)
RBC: 3.42 MIL/uL — ABNORMAL LOW (ref 4.40–5.90)
RDW: 19.7 % — AB (ref 11.5–14.5)
WBC: 5.9 10*3/uL (ref 3.8–10.6)

## 2017-12-14 LAB — LACTATE DEHYDROGENASE: LDH: 504 U/L — AB (ref 98–192)

## 2017-12-14 NOTE — Progress Notes (Signed)
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 [name]- kidney Bx- FSG [ on Prednisone Dr.[name]]  # hx of Lung nodules- resolved [[name]]/quit smoking.    No history exists.     INTERVAL HISTORY:  A  62 year old male patient with above history of primary myelofibrosis diagnosed in 2010 is here for follow-up.   Patient recently had a kidney biopsy for worsening kidney function/proteinuria.he also been started on prednisone for the last 1 week or so-by nephrology.  He admits to increased appetite.  Weight gain.  Difficulty sleeping at night.  Also complains of jitteriness.  REVIEW OF SYSTEMS:  A complete 10 point review of system is done which is negative except mentioned above/history of present illness.   PAST MEDICAL HISTORY :  Past Medical History:  Diagnosis Date  . Anemia   . Benign hypertensive renal disease   . CAD (coronary artery disease) 08/1996   Stent placed  . Chronic kidney disease, stage II (mild)   . Depression   . Hyperlipidemia 10/2002  . Hypertension   . Myelofibrosis ([place]) 2010  . Smoker   . Thrombocytopenia ([place])     PAST SURGICAL HISTORY :   Past Surgical History:  Procedure Laterality Date  . ANGIOPLASTY    . BONE MARROW ASPIRATION  03/2009  . CORONARY STENT PLACEMENT  08/1996    FAMILY HISTORY :   Family History  Problem Relation Age of Onset  . Stroke Mother        Low BP stroke  . Depression Father   . Depression Sister        Breast  . Heart disease Unknown    . Breast cancer Unknown   . Lung cancer Unknown   . Ovarian cancer Unknown   . Stomach cancer Unknown     SOCIAL HISTORY:   Social History   Tobacco Use  . Smoking status: Current Every Day Smoker    Packs/day: 1.00    Types: Cigarettes  . Smokeless tobacco: Never Used  Substance Use Topics  . Alcohol use: No    Alcohol/week: 0.0 oz    Frequency: Never    Comment: patient states he has not consumed alcohol in the last month  . Drug use: No    ALLERGIES:  has No Known Allergies.  MEDICATIONS:  Current Outpatient Medications  Medication Sig Dispense Refill  . allopurinol (ZYLOPRIM) 100 MG tablet Take 0.5 tablets (50 mg total) by mouth daily. 45 tablet 3  . allopurinol (ZYLOPRIM) 100 MG tablet TAKE 1/2 TABLET(50 MG) BY MOUTH DAILY 45 tablet 3  . amLODipine (NORVASC) 5 MG tablet TK 1 T PO ONCE A DAY  12  . citalopram (CELEXA) 20 MG tablet Take 1 tablet (20 mg total) by mouth daily. 90 tablet 1  . lisinopril (PRINIVIL,ZESTRIL) 20 MG tablet Take 1 tablet (20 mg total) by mouth daily. 90 tablet 1  . predniSONE (DELTASONE) 10 MG tablet Take 60 mg by mouth daily.     No current facility-administered medications for this visit.     PHYSICAL EXAMINATION: ECOG PERFORMANCE  STATUS: 0 - Asymptomatic  BP (!) 151/74 (BP Location: Right Arm, Patient Position: Sitting)   Pulse 76   Temp 98.2 F (36.8 C) (Tympanic)   Wt 185 lb 2 oz (84 kg)   BMI 28.99 kg/m   Filed Weights   12/14/17 1029  Weight: 185 lb 2 oz (84 kg)    GENERAL: Well-nourished well-developed; Alert, no distress and comfortable.    Accompanied by his wife. EYES: no pallor or icterus OROPHARYNX: no thrush or ulceration; good dentition  NECK: supple, no masses felt LYMPH:  no palpable lymphadenopathy in the cervical, axillary or inguinal regions LUNGS: clear to auscultation and  No wheeze or crackles HEART/CVS: regular rate & rhythm and no murmurs; No lower extremity edema;  ABDOMEN:abdomen soft, non-tender and  normal bowel sounds Musculoskeletal:no cyanosis of digits and no clubbing  PSYCH: alert & oriented x 3 with fluent speech NEURO: no focal motor/sensory deficits SKIN:  no rashes or significant lesions  LABORATORY DATA:  I have reviewed the data as listed    Component Value Date/Time   NA 138 12/14/2017 1004   NA 138 09/25/2017 1045   K 4.5 12/14/2017 1004   CL 110 12/14/2017 1004   CO2 18 (L) 12/14/2017 1004   GLUCOSE 117 (H) 12/14/2017 1004   BUN 46 (H) 12/14/2017 1004   BUN 27 09/25/2017 1045   CREATININE 2.23 (H) 12/14/2017 1004   CREATININE 1.12 09/30/2013 0953   CALCIUM 8.0 (L) 12/14/2017 1004   PROT 5.6 (L) 12/14/2017 1004   PROT 6.6 09/25/2017 1045   ALBUMIN 3.2 (L) 12/14/2017 1004   ALBUMIN 4.6 09/25/2017 1045   AST 15 12/14/2017 1004   ALT 16 (L) 12/14/2017 1004   ALKPHOS 42 12/14/2017 1004   BILITOT 0.7 12/14/2017 1004   BILITOT 0.3 09/25/2017 1045   GFRNONAA 30 (L) 12/14/2017 1004   GFRNONAA >60 09/30/2013 0953   GFRAA 35 (L) 12/14/2017 1004   GFRAA >60 09/30/2013 0953    No results found for: SPEP, UPEP  Lab Results  Component Value Date   WBC 5.9 12/14/2017   NEUTROABS 5.5 12/14/2017   HGB 9.9 (L) 12/14/2017   HCT 29.0 (L) 12/14/2017   MCV 84.9 12/14/2017   PLT 146 (L) 12/14/2017      Chemistry      Component Value Date/Time   NA 138 12/14/2017 1004   NA 138 09/25/2017 1045   K 4.5 12/14/2017 1004   CL 110 12/14/2017 1004   CO2 18 (L) 12/14/2017 1004   BUN 46 (H) 12/14/2017 1004   BUN 27 09/25/2017 1045   CREATININE 2.23 (H) 12/14/2017 1004   CREATININE 1.12 09/30/2013 0953      Component Value Date/Time   CALCIUM 8.0 (L) 12/14/2017 1004   ALKPHOS 42 12/14/2017 1004   AST 15 12/14/2017 1004   ALT 16 (L) 12/14/2017 1004   BILITOT 0.7 12/14/2017 1004   BILITOT 0.3 09/25/2017 1045     IMPRESSION: The spleen is mildly enlarged with a calculated volume of 584 cc. This is smaller than on the previous study at which time the volume was  980 cc.   Electronically Signed   By: David  Martinique M.D.   On: 11/04/2017 09:33  ASSESSMENT & PLAN:    Myelofibrosis ([place]) # 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  Currently on surveillance.  Patient's CBC - stable except for mild anemia, hemoglobin 9.9/thrombocytopenia- 140s; LDH stable ~500s.  Interestingly patient's ultrasound shows improvement of the splenic  volume-approximately 500 cc.   # Mild anemia-slightly worse at 9.9; question CKD versus worsening myelofibrosis [less likely as LDH stable/spleen improving]; no significant evidence of iron deficiency.  Question need for Aranesp from CKD.  However recommend a bone marrow biopsy prior to make sure myelofibrosis is not getting any worse.  #CKD stage III  [Dr. Latif] S/p Kidney Biopsy- FSG. On steroids prednisone 60 mg/day [tremors/ insominia- defer to Dr.[name]]  #Fatigue -unclear etiology myelofibrosis versus anemia versus others. No iron deficiency.  # Smoking cessation discussed- patient not interested in quitting.  # 10 days post BMBx; and cbc/ Follow up.   Cc; Dr.[name]       Cammie Sickle, MD 12/14/2017 3:20 PM

## 2017-12-14 NOTE — Progress Notes (Signed)
Patient here today for follow up.   

## 2017-12-14 NOTE — Telephone Encounter (Signed)
Contacted patient with bone marrow biopsy date. Patient's bone marrow will be scheduled for 12/17/17- arrival time at 730 pm. Pt's wife accepted apt time/date on behalf of patient. She will speak to her husband to confirm that patient doesn't have any other apt conflicts.  Patient's wife given the following instructions: NPO after midnight. Arrive at 730 am and bring a driver with him to the apt. 

## 2017-12-14 NOTE — Assessment & Plan Note (Addendum)
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  Currently on surveillance.  Patient's CBC - stable except for mild anemia, hemoglobin 9.9/thrombocytopenia- 140s; LDH stable ~500s.  Interestingly patient's ultrasound shows improvement of the splenic volume-approximately 500 cc.   # Mild anemia-slightly worse at 9.9; question CKD versus worsening myelofibrosis [less likely as LDH stable/spleen improving]; no significant evidence of iron deficiency.  Question need for Aranesp from CKD.  However recommend a bone marrow biopsy prior to make sure myelofibrosis is not getting any worse.  #CKD stage III  [Dr. Latif] S/p Kidney Biopsy- FSG. On steroids prednisone 60 mg/day [tremors/ insominia- defer to Dr.[name]]  #Fatigue -unclear etiology myelofibrosis versus anemia versus others. No iron deficiency.  # Smoking cessation discussed- patient not interested in quitting.  # 10 days post BMBx; and cbc/ Follow up.   Cc; Dr.[name]

## 2017-12-16 ENCOUNTER — Other Ambulatory Visit: Payer: Self-pay | Admitting: Radiology

## 2017-12-17 ENCOUNTER — Other Ambulatory Visit (HOSPITAL_COMMUNITY)
Admission: RE | Admit: 2017-12-17 | Disposition: A | Discharge status: Home / Self Care | Payer: Medicare HMO | Source: Ambulatory Visit | Attending: Internal Medicine | Admitting: Internal Medicine

## 2017-12-17 ENCOUNTER — Ambulatory Visit
Admission: RE | Admit: 2017-12-17 | Discharge: 2017-12-17 | Disposition: A | Discharge status: Home / Self Care | Payer: Medicare HMO | Source: Ambulatory Visit | Attending: Internal Medicine | Admitting: Internal Medicine

## 2017-12-17 DIAGNOSIS — N182 Chronic kidney disease, stage 2 (mild): Secondary | ICD-10-CM | POA: Diagnosis not present

## 2017-12-17 DIAGNOSIS — D7581 Myelofibrosis: Secondary | ICD-10-CM | POA: Diagnosis not present

## 2017-12-17 DIAGNOSIS — Z79899 Other long term (current) drug therapy: Secondary | ICD-10-CM | POA: Diagnosis not present

## 2017-12-17 DIAGNOSIS — E785 Hyperlipidemia, unspecified: Secondary | ICD-10-CM | POA: Diagnosis not present

## 2017-12-17 DIAGNOSIS — F329 Major depressive disorder, single episode, unspecified: Secondary | ICD-10-CM | POA: Insufficient documentation

## 2017-12-17 DIAGNOSIS — Z955 Presence of coronary angioplasty implant and graft: Secondary | ICD-10-CM | POA: Insufficient documentation

## 2017-12-17 DIAGNOSIS — D4989 Neoplasm of unspecified behavior of other specified sites: Secondary | ICD-10-CM | POA: Diagnosis not present

## 2017-12-17 DIAGNOSIS — Z7952 Long term (current) use of systemic steroids: Secondary | ICD-10-CM | POA: Insufficient documentation

## 2017-12-17 DIAGNOSIS — C946 Myelodysplastic disease, not classified: Secondary | ICD-10-CM | POA: Diagnosis not present

## 2017-12-17 DIAGNOSIS — F17210 Nicotine dependence, cigarettes, uncomplicated: Secondary | ICD-10-CM | POA: Insufficient documentation

## 2017-12-17 DIAGNOSIS — I129 Hypertensive chronic kidney disease with stage 1 through stage 4 chronic kidney disease, or unspecified chronic kidney disease: Secondary | ICD-10-CM | POA: Insufficient documentation

## 2017-12-17 DIAGNOSIS — I2510 Atherosclerotic heart disease of native coronary artery without angina pectoris: Secondary | ICD-10-CM | POA: Insufficient documentation

## 2017-12-17 DIAGNOSIS — D649 Anemia, unspecified: Secondary | ICD-10-CM | POA: Diagnosis not present

## 2017-12-17 HISTORY — DX: Dyspnea, unspecified: R06.00

## 2017-12-17 LAB — CBC WITH DIFFERENTIAL/PLATELET
Basophils Absolute: 0 10*3/uL (ref 0–0.1)
Basophils Relative: 0 %
Eosinophils Absolute: 0 10*3/uL (ref 0–0.7)
Eosinophils Relative: 0 %
HEMATOCRIT: 32.5 % — AB (ref 40.0–52.0)
HEMOGLOBIN: 10.6 g/dL — AB (ref 13.0–18.0)
LYMPHS ABS: 0.8 10*3/uL — AB (ref 1.0–3.6)
Lymphocytes Relative: 13 %
MCH: 27.8 pg (ref 26.0–34.0)
MCHC: 32.8 g/dL (ref 32.0–36.0)
MCV: 84.9 fL (ref 80.0–100.0)
MONOS PCT: 4 %
Monocytes Absolute: 0.2 10*3/uL (ref 0.2–1.0)
NEUTROS ABS: 4.9 10*3/uL (ref 1.4–6.5)
NEUTROS PCT: 83 %
Platelets: 156 10*3/uL (ref 150–440)
RBC: 3.82 MIL/uL — ABNORMAL LOW (ref 4.40–5.90)
RDW: 20.2 % — ABNORMAL HIGH (ref 11.5–14.5)
WBC: 5.9 10*3/uL (ref 3.8–10.6)

## 2017-12-17 LAB — PROTIME-INR
INR: 1
Prothrombin Time: 13.1 seconds (ref 11.4–15.2)

## 2017-12-17 MED ORDER — HYDROCODONE-ACETAMINOPHEN 5-325 MG PO TABS
1.0000 | ORAL_TABLET | ORAL | Status: DC | PRN
  Filled 2017-12-17: qty 2

## 2017-12-17 MED ORDER — HEPARIN SOD (PORK) LOCK FLUSH 100 UNIT/ML IV SOLN
INTRAVENOUS | Status: AC
  Filled 2017-12-17: qty 5

## 2017-12-17 MED ORDER — SODIUM CHLORIDE 0.9 % IV SOLN
INTRAVENOUS | Status: DC
  Administered 2017-12-17: 08:00:00 via INTRAVENOUS

## 2017-12-17 MED ORDER — MIDAZOLAM HCL 5 MG/5ML IJ SOLN
INTRAMUSCULAR | Status: AC | PRN
  Administered 2017-12-17 (×2): 1 mg via INTRAVENOUS

## 2017-12-17 MED ORDER — MIDAZOLAM HCL 5 MG/5ML IJ SOLN
INTRAMUSCULAR | Status: AC
  Filled 2017-12-17: qty 5

## 2017-12-17 MED ORDER — LIDOCAINE HCL (PF) 1 % IJ SOLN
INTRAMUSCULAR | Status: AC | PRN
  Administered 2017-12-17: 17 mL

## 2017-12-17 MED ORDER — FENTANYL CITRATE (PF) 100 MCG/2ML IJ SOLN
INTRAMUSCULAR | Status: AC
  Filled 2017-12-17: qty 4

## 2017-12-17 MED ORDER — FENTANYL CITRATE (PF) 100 MCG/2ML IJ SOLN
INTRAMUSCULAR | Status: AC | PRN
  Administered 2017-12-17: 25 ug via INTRAVENOUS
  Administered 2017-12-17: 50 ug via INTRAVENOUS
  Administered 2017-12-17: 25 ug via INTRAVENOUS

## 2017-12-17 NOTE — Procedures (Signed)
CT guided bone marrow biopsy. 2 aspirates and 2 cores. Minimal blood loss and no immediate complication.

## 2017-12-17 NOTE — H&P (Signed)
Chief Complaint: Patient was seen in consultation today for a bone marrow biopsy at the request of Brahmanday,Govinda R  Referring Physician(s): Brahmanday,Govinda R  Patient Status: [facility] - Out-pt  History of Present Illness: James Arnold is a 62 y.o. male with history of myelofibrosis.  Request for a bone marrow biopsy.  Patient is a current smoker.  He has no complaints today.  Specifically, he denies fevers, chills, difficulty breathing, chest pain or abdominal symptoms.  Past Medical History:  Diagnosis Date  . Anemia   . Benign hypertensive renal disease   . CAD (coronary artery disease) 08/1996   Stent placed  . Chronic kidney disease, stage II (mild)   . Depression   . Dyspnea    with exertion  . Hyperlipidemia 10/2002  . Hypertension   . Myelofibrosis ([place]) 2010  . Smoker   . Thrombocytopenia ([place])     Past Surgical History:  Procedure Laterality Date  . ANGIOPLASTY    . BONE MARROW ASPIRATION  03/2009  . CORONARY STENT PLACEMENT  08/1996    Allergies: Patient has no known allergies.  Medications: Prior to Admission medications   Medication Sig Start Date End Date Taking? Authorizing Provider  allopurinol (ZYLOPRIM) 100 MG tablet Take 0.5 tablets (50 mg total) by mouth daily. 09/25/17  Yes Johnson, Megan P, DO  allopurinol (ZYLOPRIM) 100 MG tablet TAKE 1/2 TABLET(50 MG) BY MOUTH DAILY 11/26/17  Yes Johnson, Megan P, DO  amLODipine (NORVASC) 5 MG tablet TK 1 T PO ONCE A DAY 10/30/17  Yes [provider]  citalopram (CELEXA) 20 MG tablet Take 1 tablet (20 mg total) by mouth daily. 08/27/17  Yes Johnson, Megan P, DO  lisinopril (PRINIVIL,ZESTRIL) 20 MG tablet Take 1 tablet (20 mg total) by mouth daily. 08/27/17  Yes Johnson, Megan P, DO  predniSONE (DELTASONE) 10 MG tablet Take 60 mg by mouth daily. 12/04/17  Yes [provider]     Family History  Problem Relation Age of Onset  . Stroke Mother        Low BP stroke  . Depression Father   .  Depression Sister        Breast  . Heart disease Unknown   . Breast cancer Unknown   . Lung cancer Unknown   . Ovarian cancer Unknown   . Stomach cancer Unknown     Social History   Socioeconomic History  . Marital status: Married    Spouse name: Not on file  . Number of children: Not on file  . Years of education: Not on file  . Highest education level: Not on file  Occupational History  . Not on file  Social Needs  . Financial resource strain: Patient refused  . Food insecurity:    Worry: Patient refused    Inability: Patient refused  . Transportation needs:    Medical: Patient refused    Non-medical: Patient refused  Tobacco Use  . Smoking status: Current Every Day Smoker    Packs/day: 1.00    Types: Cigarettes  . Smokeless tobacco: Never Used  Substance and Sexual Activity  . Alcohol use: No    Alcohol/week: 0.0 oz    Frequency: Never    Comment: patient states he has not consumed alcohol in the last month  . Drug use: No  . Sexual activity: Yes  Lifestyle  . Physical activity:    Days per week: Patient refused    Minutes per session: Patient refused  . Stress: Patient refused  Relationships  . Social connections:    Talks on phone: Patient refused    Gets together: Patient refused    Attends religious service: Patient refused    Active member of club or organization: Patient refused    Attends meetings of clubs or organizations: Patient refused    Relationship status: Married  Other Topics Concern  . Not on file  Social History Narrative  . Not on file    Review of Systems: A 12 point ROS discussed and pertinent positives are indicated in the HPI above.  All other systems are negative.  Review of Systems  Constitutional: Negative for chills and fatigue.  Respiratory: Negative.   Cardiovascular: Negative.   Gastrointestinal: Negative.   Genitourinary: Negative.   Neurological: Negative.     Vital Signs: BP (!) 148/76   Pulse 74   Temp 98 F  (36.7 C) (Oral)   Resp 15   SpO2 99%   Physical Exam  Constitutional: No distress.  HENT:  Mouth/Throat: Oropharynx is clear and moist.  Cardiovascular: Normal rate, regular rhythm and normal heart sounds.  Pulmonary/Chest: Effort normal. He has wheezes.  Abdominal: Soft. Bowel sounds are normal.  Musculoskeletal: He exhibits no edema.    Imaging: US Biopsy-no Radiologist  Result Date: 11/25/2017 CLINICAL DATA:  Proteinuria EXAM: ULTRASOUND GUIDED CORE NEEDLE BIOPSY OF THE LEFT KIDNEY COMPARISON:  CT abdomen 10/10/2013 FINDINGS: Ultrasound-guided core biopsy of the left kidney was performed by Dr. Holley Raring without a radiologist present. No solid or cystic left renal mass.  No obstructive uropathy. Three core biopsies were were performed of the inferior pole of the left kidney. Small 1.7 x 0.7 x 1.9 cm perinephric hematoma at the site of the core biopsy. IMPRESSION: Ultrasound guided biopsy of the left kidney without a radiologist present. Small left perinephric hematoma. Electronically Signed   By: Kathreen Devoid   On: 11/25/2017 11:26    Labs:  CBC: Recent Labs    11/25/17 1642 11/26/17 0338 12/14/17 1004 12/17/17 0730  WBC 3.5* 3.5* 5.9 5.9  HGB 9.6* 9.7* 9.9* 10.6*  HCT 29.1* 29.8* 29.0* 32.5*  PLT 125* 124* 146* PENDING    COAGS: Recent Labs    11/23/17 1022 12/17/17 0730  INR 1.07 1.00    BMP: Recent Labs    09/25/17 1045 11/16/17 1317 11/23/17 1022 12/14/17 1004  NA 138 135 136 138  K 5.3* 4.6 4.6 4.5  CL 105 110 109 110  CO2 17* 22 22 18*  GLUCOSE 89 126* 97 117*  BUN 27 30* 32* 46*  CALCIUM 9.0 8.7* 8.9 8.0*  CREATININE 2.01* 2.11* 2.00* 2.23*  GFRNONAA 35* 32* 34* 30*  GFRAA 40* 37* 40* 35*    LIVER FUNCTION TESTS: Recent Labs    09/25/17 1045 11/16/17 1317 11/23/17 1022 12/14/17 1004  BILITOT 0.3 0.5 1.0 0.7  AST _0 ALT 10 11* 12* 16*  ALKPHOS 74 56 62 42  PROT 6.6 6.6 6.9 5.6*  ALBUMIN 4.6 3.9 4.3 3.2*    TUMOR MARKERS: No  results for input(s): AFPTM, CEA, CA199, CHROMGRNA in the last 8760 hours.  Assessment and Plan:  62 year old with myelofibrosis and presents for bone marrow biopsy.  He had a bone marrow biopsy many years ago and is familiar with the procedure.  We discussed the CT-guided biopsy.  Risks of procedure include but not limited to bleeding and infection.  Patient is agreeable to the procedure and informed consent was obtained.  Plan for CT-guided bone  marrow biopsy with moderate sedation.  Thank you for this interesting consult.  I greatly enjoyed meeting James Arnold and look forward to participating in their care.  A copy of this report was sent to the requesting provider on this date.  Electronically Signed: Burman Riis, MD 12/17/2017, 8:15 AM   I spent a total of  15 Minutes   in face to face in clinical consultation, greater than 50% of which was counseling/coordinating care for a bone marrow biopsy.

## 2017-12-23 ENCOUNTER — Telehealth: Payer: Self-pay | Admitting: *Deleted

## 2017-12-23 NOTE — Telephone Encounter (Signed)
[name] asking if results are back from BMBx. I see that it is not back and I called her back and let her know that

## 2017-12-28 ENCOUNTER — Emergency Department: Payer: Medicare HMO

## 2017-12-28 ENCOUNTER — Observation Stay
Admission: EM | Admit: 2017-12-28 | Discharge: 2017-12-30 | Disposition: A | Discharge status: Home / Self Care | Payer: Medicare HMO | Attending: Internal Medicine | Admitting: Internal Medicine

## 2017-12-28 ENCOUNTER — Telehealth: Payer: Self-pay | Admitting: *Deleted

## 2017-12-28 ENCOUNTER — Encounter: Payer: Self-pay | Admitting: Emergency Medicine

## 2017-12-28 ENCOUNTER — Other Ambulatory Visit: Payer: Self-pay

## 2017-12-28 DIAGNOSIS — R079 Chest pain, unspecified: Secondary | ICD-10-CM | POA: Diagnosis present

## 2017-12-28 DIAGNOSIS — J90 Pleural effusion, not elsewhere classified: Secondary | ICD-10-CM | POA: Diagnosis not present

## 2017-12-28 DIAGNOSIS — N183 Chronic kidney disease, stage 3 (moderate): Secondary | ICD-10-CM | POA: Insufficient documentation

## 2017-12-28 DIAGNOSIS — I080 Rheumatic disorders of both mitral and aortic valves: Secondary | ICD-10-CM | POA: Diagnosis not present

## 2017-12-28 DIAGNOSIS — R0602 Shortness of breath: Secondary | ICD-10-CM | POA: Diagnosis not present

## 2017-12-28 DIAGNOSIS — I25110 Atherosclerotic heart disease of native coronary artery with unstable angina pectoris: Principal | ICD-10-CM | POA: Insufficient documentation

## 2017-12-28 DIAGNOSIS — N184 Chronic kidney disease, stage 4 (severe): Secondary | ICD-10-CM | POA: Diagnosis present

## 2017-12-28 DIAGNOSIS — Z79899 Other long term (current) drug therapy: Secondary | ICD-10-CM | POA: Diagnosis not present

## 2017-12-28 DIAGNOSIS — I10 Essential (primary) hypertension: Secondary | ICD-10-CM

## 2017-12-28 DIAGNOSIS — I700 Atherosclerosis of aorta: Secondary | ICD-10-CM | POA: Insufficient documentation

## 2017-12-28 DIAGNOSIS — Z7982 Long term (current) use of aspirin: Secondary | ICD-10-CM | POA: Diagnosis not present

## 2017-12-28 DIAGNOSIS — J449 Chronic obstructive pulmonary disease, unspecified: Secondary | ICD-10-CM | POA: Diagnosis not present

## 2017-12-28 DIAGNOSIS — R0789 Other chest pain: Secondary | ICD-10-CM | POA: Diagnosis not present

## 2017-12-28 DIAGNOSIS — Z955 Presence of coronary angioplasty implant and graft: Secondary | ICD-10-CM | POA: Diagnosis not present

## 2017-12-28 DIAGNOSIS — F17210 Nicotine dependence, cigarettes, uncomplicated: Secondary | ICD-10-CM | POA: Diagnosis not present

## 2017-12-28 DIAGNOSIS — F329 Major depressive disorder, single episode, unspecified: Secondary | ICD-10-CM | POA: Insufficient documentation

## 2017-12-28 DIAGNOSIS — I2510 Atherosclerotic heart disease of native coronary artery without angina pectoris: Secondary | ICD-10-CM | POA: Diagnosis present

## 2017-12-28 DIAGNOSIS — I129 Hypertensive chronic kidney disease with stage 1 through stage 4 chronic kidney disease, or unspecified chronic kidney disease: Secondary | ICD-10-CM | POA: Diagnosis present

## 2017-12-28 DIAGNOSIS — F339 Major depressive disorder, recurrent, unspecified: Secondary | ICD-10-CM | POA: Diagnosis present

## 2017-12-28 DIAGNOSIS — N185 Chronic kidney disease, stage 5: Secondary | ICD-10-CM | POA: Diagnosis present

## 2017-12-28 DIAGNOSIS — D7581 Myelofibrosis: Secondary | ICD-10-CM | POA: Diagnosis not present

## 2017-12-28 DIAGNOSIS — E785 Hyperlipidemia, unspecified: Secondary | ICD-10-CM | POA: Diagnosis not present

## 2017-12-28 LAB — CBC
HCT: 30.2 % — ABNORMAL LOW (ref 40.0–52.0)
Hemoglobin: 9.9 g/dL — ABNORMAL LOW (ref 13.0–18.0)
MCH: 28.2 pg (ref 26.0–34.0)
MCHC: 32.9 g/dL (ref 32.0–36.0)
MCV: 85.8 fL (ref 80.0–100.0)
PLATELETS: 171 10*3/uL (ref 150–440)
RBC: 3.52 MIL/uL — ABNORMAL LOW (ref 4.40–5.90)
RDW: 20.3 % — AB (ref 11.5–14.5)
WBC: 6.7 10*3/uL (ref 3.8–10.6)

## 2017-12-28 LAB — BASIC METABOLIC PANEL
Anion gap: 6 (ref 5–15)
BUN: 45 mg/dL — AB (ref 6–20)
CALCIUM: 7.9 mg/dL — AB (ref 8.9–10.3)
CO2: 23 mmol/L (ref 22–32)
CREATININE: 2.45 mg/dL — AB (ref 0.61–1.24)
Chloride: 109 mmol/L (ref 101–111)
GFR calc Af Amer: 31 mL/min — ABNORMAL LOW (ref >60)
GFR, EST NON AFRICAN AMERICAN: 27 mL/min — AB (ref >60)
GLUCOSE: 227 mg/dL — AB (ref 65–99)
Potassium: 4.2 mmol/L (ref 3.5–5.1)
SODIUM: 138 mmol/L (ref 135–145)

## 2017-12-28 LAB — TROPONIN I

## 2017-12-28 MED ORDER — IPRATROPIUM-ALBUTEROL 0.5-2.5 (3) MG/3ML IN SOLN
3.0000 mL | Freq: Once | RESPIRATORY_TRACT | Status: AC
  Administered 2017-12-28: 3 mL via RESPIRATORY_TRACT
  Filled 2017-12-28: qty 3

## 2017-12-28 MED ORDER — NITROGLYCERIN 2 % TD OINT
0.5000 [in_us] | TOPICAL_OINTMENT | Freq: Once | TRANSDERMAL | Status: AC
  Administered 2017-12-28: 0.5 [in_us] via TOPICAL
  Filled 2017-12-28: qty 1

## 2017-12-28 NOTE — ED Triage Notes (Signed)
Pt reports was sitting at the house watching TV and all the sudden started with CP and SOB. Pt reports had stents placed in 1997.

## 2017-12-28 NOTE — ED Provider Notes (Signed)
[facility] Emergency Department Provider Note    First MD Initiated Contact with Patient 12/28/17 1944     (approximate)  I have reviewed the triage vital signs and the nursing notes.   HISTORY  Chief Complaint Chest Pain    HPI James Arnold is a 62 y.o. male extensive past medical history as listed below as well as a history of CAD status post stent presents with chief complaint of chest pain and pressure that occurred while patient was sitting at rest today watching TV.  States that he feels that the pain was like someone was sitting on his chest and this lasted for over 30 minutes to an hour.  Did have some associated shortness of breath with some cough.  He does continue to smoke but is never been diagnosed with bronchitis or COPD.  Has also noticed bilateral lower extremity swelling.  While in the ER waiting room states that his pain has resolved.  Denied any nausea or vomiting.  No pain radiating through to his back.  Past Medical History:  Diagnosis Date  . Anemia   . Benign hypertensive renal disease   . CAD (coronary artery disease) 08/1996   Stent placed  . Chronic kidney disease, stage II (mild)   . Depression   . Dyspnea    with exertion  . Hyperlipidemia 10/2002  . Hypertension   . Myelofibrosis ([place]) 2010  . Smoker   . Thrombocytopenia ([place])    Family History  Problem Relation Age of Onset  . Stroke Mother        Low BP stroke  . Depression Father   . Depression Sister        Breast  . Heart disease Unknown   . Breast cancer Unknown   . Lung cancer Unknown   . Ovarian cancer Unknown   . Stomach cancer Unknown    Past Surgical History:  Procedure Laterality Date  . ANGIOPLASTY    . BONE MARROW ASPIRATION  03/2009  . CORONARY STENT PLACEMENT  08/1996   Patient Active Problem List   Diagnosis Date Noted  . Proteinuria 11/25/2017  . Gout 08/27/2017  . Acute renal failure (ARF) ([place]) 05/15/2017  . Myelofibrosis ([place])   .  Depression   . Benign hypertensive renal disease   . CKD (chronic kidney disease), stage III ([place])   . Hyperlipidemia 10/23/2002  . CAD (coronary artery disease) 08/22/1996      Prior to Admission medications   Medication Sig Start Date End Date Taking? Authorizing Provider  allopurinol (ZYLOPRIM) 100 MG tablet Take 0.5 tablets (50 mg total) by mouth daily. 09/25/17  Yes Johnson, Megan P, DO  amLODipine (NORVASC) 5 MG tablet Take 1 tablet (5MG ) by mouth every day 10/30/17  Yes [provider]  citalopram (CELEXA) 20 MG tablet Take 1 tablet (20 mg total) by mouth daily. 08/27/17  Yes Johnson, Megan P, DO  lisinopril (PRINIVIL,ZESTRIL) 20 MG tablet Take 1 tablet (20 mg total) by mouth daily. 08/27/17  Yes Johnson, Megan P, DO  predniSONE (DELTASONE) 10 MG tablet Take 60 mg by mouth daily. 12/04/17  Yes [provider]    Allergies Patient has no known allergies.    Social History Social History   Tobacco Use  . Smoking status: Current Every Day Smoker    Packs/day: 1.00    Types: Cigarettes  . Smokeless tobacco: Never Used  Substance Use Topics  . Alcohol use: No    Alcohol/week: 0.0 oz  Frequency: Never    Comment: patient states he has not consumed alcohol in the last month  . Drug use: No    Review of Systems Patient denies headaches, rhinorrhea, blurry vision, numbness, shortness of breath, chest pain, edema, cough, abdominal pain, nausea, vomiting, diarrhea, dysuria, fevers, rashes or hallucinations unless otherwise stated above in HPI. ____________________________________________   PHYSICAL EXAM:  VITAL SIGNS: Vitals:   12/28/17 2105 12/28/17 2137  BP: (!) 194/95 (!) 163/70  Pulse: 74 73  Resp: 17 20  Temp:    SpO2: 98% 99%    Constitutional: Alert and oriented. Well appearing and in no acute distress. Eyes: Conjunctivae are normal.  Head: Atraumatic. Nose: No congestion/rhinnorhea. Mouth/Throat: Mucous membranes are moist.   Neck: No  stridor. Painless ROM.  Cardiovascular: Normal rate, regular rhythm. Grossly normal heart sounds.  Good peripheral circulation. Respiratory: Normal respiratory effort.  No retractions. Lungs CTAB. Gastrointestinal: Soft and nontender. No distention. No abdominal bruits. No CVA tenderness. Genitourinary:  Musculoskeletal: No lower extremity tenderness nor edema.  No joint effusions. Neurologic:  Normal speech and language. No gross focal neurologic deficits are appreciated. No facial droop Skin:  Skin is warm, dry and intact. No rash noted. Psychiatric: Mood and affect are normal. Speech and behavior are normal.  ____________________________________________   LABS (all labs ordered are listed, but only abnormal results are displayed)  Results for orders placed or performed during the hospital encounter of 12/28/17 (from the past 24 hour(s))  Basic metabolic panel     Status: Abnormal   Collection Time: 12/28/17  2:19 PM  Result Value Ref Range   Sodium 138 135 - 145 mmol/L   Potassium 4.2 3.5 - 5.1 mmol/L   Chloride 109 101 - 111 mmol/L   CO2 23 22 - 32 mmol/L   Glucose, Bld 227 (H) 65 - 99 mg/dL   BUN 45 (H) 6 - 20 mg/dL   Creatinine, Ser 2.45 (H) 0.61 - 1.24 mg/dL   Calcium 7.9 (L) 8.9 - 10.3 mg/dL   GFR calc non Af Amer 27 (L) >60 mL/min   GFR calc Af Amer 31 (L) >60 mL/min   Anion gap 6 5 - 15  CBC     Status: Abnormal   Collection Time: 12/28/17  2:19 PM  Result Value Ref Range   WBC 6.7 3.8 - 10.6 K/uL   RBC 3.52 (L) 4.40 - 5.90 MIL/uL   Hemoglobin 9.9 (L) 13.0 - 18.0 g/dL   HCT 30.2 (L) 40.0 - 52.0 %   MCV 85.8 80.0 - 100.0 fL   MCH 28.2 26.0 - 34.0 pg   MCHC 32.9 32.0 - 36.0 g/dL   RDW 20.3 (H) 11.5 - 14.5 %   Platelets 171 150 - 440 K/uL  Troponin I     Status: None   Collection Time: 12/28/17  2:19 PM  Result Value Ref Range   Troponin I <0.03 <0.03 ng/mL  Troponin I     Status: None   Collection Time: 12/28/17  5:31 PM  Result Value Ref Range   Troponin I  <0.03 <0.03 ng/mL   ____________________________________________  EKG My review and personal interpretation at Time: 14:04   Indication: chest pain  Rate: 85  Rhythm: sinus Axis: normal Other: normal intervals, no stemi ____________________________________________  RADIOLOGY  I personally reviewed all radiographic images ordered to evaluate for the above acute complaints and reviewed radiology reports and findings.  These findings were personally discussed with the patient.  Please see medical record for  radiology report.  ____________________________________________   PROCEDURES  Procedure(s) performed:  Procedures    Critical Care performed: no ____________________________________________   INITIAL IMPRESSION / ASSESSMENT AND PLAN / ED COURSE  Pertinent labs & imaging results that were available during my care of the patient were reviewed by me and considered in my medical decision making (see chart for details).  DDX: ACS, pericarditis, esophagitis, boerhaaves, pe, dissection, pna, bronchitis, costochondritis   James Arnold is a 62 y.o. who presents to the ED with chest pain as described above.  Patient with some typical features.  Initial troponins are negative and EKG shows no changes but patient has similar presentation subsequently resulting in cardiac cath and stent.  His abdominal exam soft and benign.  May have a component of bronchitis with COPD but based on his high risk features I do believe patient would benefit from hospitalization for further cardiac workup.      As part of my medical decision making, I reviewed the following data within the [facility] notes reviewed and incorporated, Labs reviewed, notes from prior ED visits and [place] Controlled Substance Database   ____________________________________________   FINAL CLINICAL IMPRESSION(S) / ED DIAGNOSES  Final diagnoses:  Chest pain, unspecified type  Hypertension,  unspecified type      NEW MEDICATIONS STARTED DURING THIS VISIT:  New Prescriptions   No medications on file     Note:  This document was prepared using Dragon voice recognition software and may include unintentional dictation errors.    Merlyn Lot, MD 12/28/17 2148

## 2017-12-28 NOTE — ED Notes (Signed)
Pt currently eat fried seafood and baked potato with out difficulty, pt's wife at bedside.

## 2017-12-28 NOTE — ED Notes (Signed)
Pt upset about wait. Explained wait time to him.  Ambulatory. Unlabored. Remains stable.

## 2017-12-28 NOTE — ED Triage Notes (Signed)
FIRST NURSE NOTE-here for chest pain.  Ambulatory. NAD. Pulled next for EKG

## 2017-12-28 NOTE — Telephone Encounter (Signed)
Wife called to report that patient is going to ER for difficulty breathing and wants Dr B to know this

## 2017-12-28 NOTE — ED Notes (Signed)
Pt states he is tired of being in a hallway bed, pt states he would like to leave. This RN asked pt if moving into a room would be ok, pt states yes. Pt moved to room 11, given tv control and sprite.

## 2017-12-28 NOTE — ED Notes (Signed)
Wife asking about wait time.  Explained delay.  Rechecked vital signs.

## 2017-12-29 ENCOUNTER — Encounter: Payer: Self-pay | Admitting: Internal Medicine

## 2017-12-29 ENCOUNTER — Telehealth: Payer: Self-pay | Admitting: *Deleted

## 2017-12-29 ENCOUNTER — Other Ambulatory Visit: Payer: Self-pay

## 2017-12-29 DIAGNOSIS — I200 Unstable angina: Secondary | ICD-10-CM | POA: Diagnosis not present

## 2017-12-29 DIAGNOSIS — D7581 Myelofibrosis: Secondary | ICD-10-CM

## 2017-12-29 DIAGNOSIS — E785 Hyperlipidemia, unspecified: Secondary | ICD-10-CM | POA: Diagnosis not present

## 2017-12-29 DIAGNOSIS — R079 Chest pain, unspecified: Secondary | ICD-10-CM | POA: Diagnosis not present

## 2017-12-29 DIAGNOSIS — N183 Chronic kidney disease, stage 3 (moderate): Secondary | ICD-10-CM | POA: Diagnosis not present

## 2017-12-29 DIAGNOSIS — I10 Essential (primary) hypertension: Secondary | ICD-10-CM

## 2017-12-29 DIAGNOSIS — I2510 Atherosclerotic heart disease of native coronary artery without angina pectoris: Secondary | ICD-10-CM | POA: Diagnosis not present

## 2017-12-29 LAB — CBC
HCT: 29.3 % — ABNORMAL LOW (ref 40.0–52.0)
Hemoglobin: 9.5 g/dL — ABNORMAL LOW (ref 13.0–18.0)
MCH: 28.2 pg (ref 26.0–34.0)
MCHC: 32.5 g/dL (ref 32.0–36.0)
MCV: 86.8 fL (ref 80.0–100.0)
PLATELETS: 155 10*3/uL (ref 150–440)
RBC: 3.38 MIL/uL — ABNORMAL LOW (ref 4.40–5.90)
RDW: 20.2 % — AB (ref 11.5–14.5)
WBC: 4.8 10*3/uL (ref 3.8–10.6)

## 2017-12-29 LAB — BASIC METABOLIC PANEL
Anion gap: 5 (ref 5–15)
BUN: 43 mg/dL — AB (ref 6–20)
CO2: 23 mmol/L (ref 22–32)
CREATININE: 2.37 mg/dL — AB (ref 0.61–1.24)
Calcium: 7.7 mg/dL — ABNORMAL LOW (ref 8.9–10.3)
Chloride: 113 mmol/L — ABNORMAL HIGH (ref 101–111)
GFR calc Af Amer: 32 mL/min — ABNORMAL LOW (ref >60)
GFR calc non Af Amer: 28 mL/min — ABNORMAL LOW (ref >60)
Glucose, Bld: 68 mg/dL (ref 65–99)
Potassium: 3.8 mmol/L (ref 3.5–5.1)
Sodium: 141 mmol/L (ref 135–145)

## 2017-12-29 LAB — TROPONIN I: Troponin I: <0.03 ng/mL (ref <0.03)

## 2017-12-29 MED ORDER — ACETAMINOPHEN 650 MG RE SUPP: 650.0000 mg | Freq: Four times a day (QID) | RECTAL | Status: DC | PRN

## 2017-12-29 MED ORDER — ASPIRIN EC 81 MG PO TBEC
81.0000 mg | DELAYED_RELEASE_TABLET | Freq: Every day | ORAL | Status: DC
  Administered 2017-12-29 – 2017-12-30 (×2): 81 mg via ORAL
  Filled 2017-12-29 (×2): qty 1

## 2017-12-29 MED ORDER — AMLODIPINE BESYLATE 5 MG PO TABS
5.0000 mg | ORAL_TABLET | Freq: Every day | ORAL | Status: DC
  Administered 2017-12-29 – 2017-12-30 (×2): 5 mg via ORAL
  Filled 2017-12-29 (×2): qty 1

## 2017-12-29 MED ORDER — ACETAMINOPHEN 325 MG PO TABS: 650.0000 mg | ORAL_TABLET | Freq: Four times a day (QID) | ORAL | Status: DC | PRN

## 2017-12-29 MED ORDER — HEPARIN SODIUM (PORCINE) 5000 UNIT/ML IJ SOLN
5000.0000 [IU] | Freq: Three times a day (TID) | INTRAMUSCULAR | Status: DC
  Administered 2017-12-29 – 2017-12-30 (×4): 5000 [IU] via SUBCUTANEOUS
  Filled 2017-12-29 (×4): qty 1

## 2017-12-29 MED ORDER — CARVEDILOL 3.125 MG PO TABS
3.1250 mg | ORAL_TABLET | Freq: Two times a day (BID) | ORAL | Status: DC
  Administered 2017-12-29 – 2017-12-30 (×2): 3.125 mg via ORAL
  Filled 2017-12-29 (×2): qty 1

## 2017-12-29 MED ORDER — ONDANSETRON HCL 4 MG/2ML IJ SOLN: 4.0000 mg | Freq: Four times a day (QID) | INTRAMUSCULAR | Status: DC | PRN

## 2017-12-29 MED ORDER — LISINOPRIL 20 MG PO TABS
20.0000 mg | ORAL_TABLET | Freq: Every day | ORAL | Status: DC
  Administered 2017-12-29: 20 mg via ORAL
  Filled 2017-12-29: qty 1

## 2017-12-29 MED ORDER — OXYCODONE HCL 5 MG PO TABS
5.0000 mg | ORAL_TABLET | ORAL | Status: DC | PRN
  Administered 2017-12-29 (×2): 5 mg via ORAL
  Filled 2017-12-29: qty 1

## 2017-12-29 MED ORDER — ONDANSETRON HCL 4 MG PO TABS: 4.0000 mg | ORAL_TABLET | Freq: Four times a day (QID) | ORAL | Status: DC | PRN

## 2017-12-29 MED ORDER — FUROSEMIDE 10 MG/ML IJ SOLN
40.0000 mg | Freq: Once | INTRAMUSCULAR | Status: AC
  Administered 2017-12-29: 40 mg via INTRAVENOUS
  Filled 2017-12-29: qty 4

## 2017-12-29 MED ORDER — OXYCODONE HCL 5 MG PO TABS
ORAL_TABLET | ORAL | Status: AC
  Administered 2017-12-29: 5 mg via ORAL
  Filled 2017-12-29: qty 1

## 2017-12-29 MED ORDER — NICOTINE 21 MG/24HR TD PT24
21.0000 mg | MEDICATED_PATCH | Freq: Every day | TRANSDERMAL | Status: DC
  Administered 2017-12-29 – 2017-12-30 (×2): 21 mg via TRANSDERMAL
  Filled 2017-12-29 (×2): qty 1

## 2017-12-29 MED ORDER — ATORVASTATIN CALCIUM 20 MG PO TABS
40.0000 mg | ORAL_TABLET | Freq: Every day | ORAL | Status: DC
  Administered 2017-12-29: 40 mg via ORAL
  Filled 2017-12-29: qty 2

## 2017-12-29 MED ORDER — CITALOPRAM HYDROBROMIDE 20 MG PO TABS
20.0000 mg | ORAL_TABLET | Freq: Every day | ORAL | Status: DC
  Administered 2017-12-29 – 2017-12-30 (×2): 20 mg via ORAL
  Filled 2017-12-29 (×2): qty 1

## 2017-12-29 NOTE — Progress Notes (Signed)
Patient seen and evaluated by me today History and physical by Dr. Lance Coon reviewed Management plan reviewed by Dr. Shanon Brow  Has decreased chest pain Patient does not want to see Dr. Saralyn Pilar He wants to see another cardiology group.  We will put [facility] cardiology consultation. Serial troponin is negative Agree with the management plan with Dr. Lance Coon.

## 2017-12-29 NOTE — ED Notes (Signed)
This RN notified admitting Dr pt would like something for headache, no new orders at this time.

## 2017-12-29 NOTE — Progress Notes (Signed)
Tobacco cessation counseled to patient for six minutes Nicotine patch offered Harmful effects of smoking explained.

## 2017-12-29 NOTE — Telephone Encounter (Signed)
[name] called and wanted to be sure that Dr B is aware that patient in in the hospital Room 237

## 2017-12-29 NOTE — Progress Notes (Signed)
Advanced care plan.  Purpose of the Encounter: CODE STATUS  Parties in Attendance: Patient  Patient's Decision Capacity:Good  Subjective/Patient's story: Admitted for chest pain   Objective/Medical story Plan for cardiac cath Echo and cardiac enzymes    Goals of care determination:  Advance directives discussed Patient wants everything done that is cardiac resuscitation, intubation and ventilator if need arises   CODE STATUS: Full code   Time spent discussing advanced care planning: 16 minutes

## 2017-12-29 NOTE — Care Management Obs Status (Signed)
[facility] NOTIFICATION   Patient Details  Name: James Arnold MRN: [id number] Date of Birth: 1956/01/02   Medicare Observation Status Notification Given:  Yes    Beverly Sessions, RN 12/29/2017, 5:39 PM

## 2017-12-29 NOTE — Telephone Encounter (Signed)
MD is aware and states he will come in to see patient tomorrow morning.

## 2017-12-29 NOTE — H&P (Signed)
[facility] [place] NAME: James Arnold    MR#:  [id number]  DATE OF BIRTH:  1956-03-22  DATE OF ADMISSION:  12/28/2017  PRIMARY CARE PHYSICIAN: Valerie Roys, DO   REQUESTING/REFERRING PHYSICIAN: Quentin Cornwall, MD  CHIEF COMPLAINT:   Chief Complaint  Patient presents with  . Chest Pain    HISTORY OF PRESENT ILLNESS:  James Arnold  is a 62 y.o. male who presents with an episode of chest pain.  Patient was at home at rest when he had an episode of central chest pain, nonradiating.  This was not associated with any diaphoresis, but patient did have some mild shortness of breath.  He has a known history of CAD with prior stent placement, and states that this pain was similar to pain he had in the past.  Here in the ED his workup is largely normal upfront, including 2 normal troponins.  Hospitalist were called for admission and further evaluation  PAST MEDICAL HISTORY:   Past Medical History:  Diagnosis Date  . Anemia   . Benign hypertensive renal disease   . CAD (coronary artery disease) 08/1996   Stent placed  . Chronic kidney disease, stage II (mild)   . Depression   . Dyspnea    with exertion  . Hyperlipidemia 10/2002  . Hypertension   . 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 Smoker   . Thrombocytopenia ([place])      PAST SURGICAL HISTORY:   Past Surgical History:  Procedure Laterality Date  . ANGIOPLASTY    . BONE MARROW ASPIRATION  03/2009  . CORONARY STENT PLACEMENT  08/1996     SOCIAL HISTORY:   Social History   Tobacco Use  . Smoking status: Current Every Day Smoker    Packs/day: 1.00    Types: Cigarettes  . Smokeless tobacco: Never Used  Substance Use Topics  . Alcohol use: No    Alcohol/week: 0.0 oz    Frequency: Never    Comment: patient states he has not consumed alcohol in the last month     FAMILY HISTORY:   Family History  Problem Relation Age of Onset  . Stroke Mother        Low BP stroke  . Depression  Father   . Depression Sister        Breast  . Heart disease Unknown   . Breast cancer Unknown   . Lung cancer Unknown   . Ovarian cancer Unknown   . Stomach cancer Unknown      DRUG ALLERGIES:  No Known Allergies  MEDICATIONS AT HOME:   Prior to Admission medications   Medication Sig Start Date End Date Taking? Authorizing Provider  allopurinol (ZYLOPRIM) 100 MG tablet Take 0.5 tablets (50 mg total) by mouth daily. 09/25/17  Yes Johnson, Megan P, DO  amLODipine (NORVASC) 5 MG tablet Take 1 tablet (5MG ) by mouth every day 10/30/17  Yes [provider]  citalopram (CELEXA) 20 MG tablet Take 1 tablet (20 mg total) by mouth daily. 08/27/17  Yes Johnson, Megan P, DO  lisinopril (PRINIVIL,ZESTRIL) 20 MG tablet Take 1 tablet (20 mg total) by mouth daily. 08/27/17  Yes Johnson, Megan P, DO  predniSONE (DELTASONE) 10 MG tablet Take 60 mg by mouth daily. 12/04/17  Yes [provider]    REVIEW OF SYSTEMS:  Review of Systems  Constitutional: Negative for chills, fever, malaise/fatigue and weight loss.  HENT: Negative for ear pain, hearing loss and tinnitus.  Eyes: Negative for blurred vision, double vision, pain and redness.  Respiratory: Negative for cough, hemoptysis and shortness of breath.   Cardiovascular: Positive for chest pain. Negative for palpitations, orthopnea and leg swelling.  Gastrointestinal: Negative for abdominal pain, constipation, diarrhea, nausea and vomiting.  Genitourinary: Negative for dysuria, frequency and hematuria.  Musculoskeletal: Negative for back pain, joint pain and neck pain.  Skin:       No acne, rash, or lesions  Neurological: Negative for dizziness, tremors, focal weakness and weakness.  Endo/Heme/Allergies: Negative for polydipsia. Does not bruise/bleed easily.  Psychiatric/Behavioral: Negative for depression. The patient is not nervous/anxious and does not have insomnia.      VITAL SIGNS:   Vitals:   12/28/17 2137 12/28/17 2300  12/28/17 2330 12/29/17 0000  BP: (!) 163/70 (!) 157/66 (!) 161/65 (!) 155/81  Pulse: 73 73 76 85  Resp: 20 14 20  (!) 21  Temp:      TempSrc:      SpO2: 99% 96% 96% 95%  Weight:      Height:       Wt Readings from Last 3 Encounters:  12/28/17 79.4 kg (175 lb)  12/14/17 84 kg (185 lb 2 oz)  11/25/17 76.7 kg (169 lb 1.6 oz)    PHYSICAL EXAMINATION:  Physical Exam  Vitals reviewed. Constitutional: He is oriented to person, place, and time. He appears well-developed and well-nourished. No distress.  HENT:  Head: Normocephalic and atraumatic.  Mouth/Throat: Oropharynx is clear and moist.  Eyes: Pupils are equal, round, and reactive to light. Conjunctivae and EOM are normal. No scleral icterus.  Neck: Normal range of motion. Neck supple. No JVD present. No thyromegaly present.  Cardiovascular: Normal rate, regular rhythm and intact distal pulses. Exam reveals no gallop and no friction rub.  No murmur heard. Respiratory: Effort normal and breath sounds normal. No respiratory distress. He has no wheezes. He has no rales.  GI: Soft. Bowel sounds are normal. He exhibits no distension. There is no tenderness.  Musculoskeletal: Normal range of motion. He exhibits no edema.  No arthritis, no gout  Lymphadenopathy:    He has no cervical adenopathy.  Neurological: He is alert and oriented to person, place, and time. No cranial nerve deficit.  No dysarthria, no aphasia  Skin: Skin is warm and dry. No rash noted. No erythema.  Psychiatric: He has a normal mood and affect. His behavior is normal. Judgment and thought content normal.    LABORATORY PANEL:   CBC Recent Labs  Lab 12/28/17 1419  WBC 6.7  HGB 9.9*  HCT 30.2*  PLT 171   ------------------------------------------------------------------------------------------------------------------  Chemistries  Recent Labs  Lab 12/28/17 1419  NA 138  K 4.2  CL 109  CO2 23  GLUCOSE 227*  BUN 45*  CREATININE 2.45*  CALCIUM 7.9*    ------------------------------------------------------------------------------------------------------------------  Cardiac Enzymes Recent Labs  Lab 12/28/17 1731  TROPONINI <0.03   ------------------------------------------------------------------------------------------------------------------  RADIOLOGY:  Dg Chest 2 View  Result Date: 12/28/2017 CLINICAL DATA:  Onset of chest pressure and shortness of breath last night which have persisted today. No other symptoms. EXAM: CHEST - 2 VIEW COMPARISON:  CT scan of the chest of April 18, 2014 FINDINGS: The lungs are well-expanded. The interstitial markings are coarse. There is small bilateral pleural effusions blunting the posterior costophrenic angles. The heart is normal in size. The mediastinum is normal in width. There is calcification in the wall of the aortic arch. The bony thorax is unremarkable. IMPRESSION: COPD. No alveolar pneumonia  nor pulmonary edema. Trace bilateral pleural effusions of uncertain etiology. Thoracic aortic atherosclerosis. Electronically Signed   By: Ronniesha Seibold  [place] M.D.   On: 12/28/2017 15:02    EKG:   Orders placed or performed during the hospital encounter of 12/28/17  . ED EKG within 10 minutes  . ED EKG within 10 minutes    IMPRESSION AND PLAN:  Principal Problem:   Chest pain -2 troponins normal so far.  We will get a third troponin.  We will get an echocardiogram and a cardiology consult Active Problems:   CAD (coronary artery disease) -continue home meds, other workup as above   Benign hypertensive renal disease -stable, continue home medications   Hyperlipidemia -home dose antilipid   Depression -continue home dose antidepressant   CKD (chronic kidney disease), stage III ([place]) -at baseline, avoid nephrotoxins and monitor  Chart review performed and case discussed with ED provider. Labs, imaging and/or ECG reviewed by provider and discussed with patient/family. Management plans discussed with the  patient and/or family.  DVT PROPHYLAXIS: SubQ heparin  GI PROPHYLAXIS: None  ADMISSION STATUS: Observation  CODE STATUS: Full Code Status History    Date Active Date Inactive Code Status Order ID Comments User Context   11/25/2017 1532 11/26/2017 1607 Full Code [id number]  Anthonette Legato, MD Inpatient      TOTAL TIME TAKING CARE OF THIS PATIENT: 40 minutes.   Tully Mcinturff [place] 12/29/2017, 1:09 AM  [employer]  [phone]  CC: Primary care physician; Valerie Roys, DO  Note:  This document was prepared using Dragon voice recognition software and may include unintentional dictation errors.

## 2017-12-29 NOTE — Consult Note (Signed)
Cardiology Consultation:   [name]; [id number]; May 05, 1956   Admit date: 12/28/2017 Date of Consult: 12/29/2017  Primary Care Provider: Valerie Roys, DO Primary Cardiologist: None - initial consult by Lauralye Kinn Primary Electrophysiologist:  None   Patient Profile:   James Arnold is a 62 y.o. 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  History of Present Illness:   [name] reports that over the last month, he has had intermittent exertional chest discomfort and shortness of breath.  Yesterday morning, while seated in front of the television, he had sudden onset of a sharp and pressure-like substernal chest pain with accompanying shortness of breath.  Its maximal intensity was 9/10.  The discomfort persisted throughout most of the day, including when he was in the ED waiting room.  It subsided on its own around 5 yesterday afternoon.  The pain was different than what he experienced at the time of his MI in 1997.  At that time, his primary complaint was pain involving the left arm.  He has not felt anything reminiscent of that recently.  He is currently chest pain-free.  [name] reports a 30 pound weight gain over the last several weeks.  He was started on prednisone within the last 1-2 months for treatment of FSGS.  He notes some leg edema but denies orthopnea and PND.  He smokes 1 pack/day and denies alcohol and illicit drug use.  Workup in the emergency department is notable for negative troponin x4, hemoglobin of 9.5, and normal EKG.  Past Medical History:  Diagnosis Date  . Anemia   . Benign hypertensive renal disease   . CAD (coronary artery disease) 08/1996   Stent placed  . Chronic kidney disease, stage II (mild)   . Depression   .  Dyspnea    with exertion  . Hyperlipidemia 10/2002  . Hypertension   . Myelofibrosis ([place]) 2010  . Smoker   . Thrombocytopenia ([place])     Past Surgical History:  Procedure Laterality Date  . ANGIOPLASTY    . BONE MARROW ASPIRATION  03/2009  . CORONARY STENT PLACEMENT  08/1996     Home Medications:  Prior to Admission medications   Medication Sig Start Date Asiana Benninger Date Taking? Authorizing Provider  allopurinol (ZYLOPRIM) 100 MG tablet Take 0.5 tablets (50 mg total) by mouth daily. 09/25/17  Yes Johnson, Megan P, DO  amLODipine (NORVASC) 5 MG tablet Take 1 tablet (5MG ) by mouth every day 10/30/17  Yes [provider]  citalopram (CELEXA) 20 MG tablet Take 1 tablet (20 mg total) by mouth daily. 08/27/17  Yes Johnson, Megan P, DO  lisinopril (PRINIVIL,ZESTRIL) 20 MG tablet Take 1 tablet (20 mg total) by mouth daily. 08/27/17  Yes Johnson, Megan P, DO  predniSONE (DELTASONE) 10 MG tablet Take 60 mg by mouth daily. 12/04/17  Yes [provider]    Inpatient Medications: Scheduled Meds: . amLODipine  5 mg Oral Daily  . citalopram  20 mg Oral Daily  . heparin  5,000 Units Subcutaneous Q8H  . lisinopril  20 mg Oral Daily  . nicotine  21 mg Transdermal Daily   Continuous Infusions:  PRN Meds: acetaminophen **OR** acetaminophen, ondansetron **OR** ondansetron (ZOFRAN) IV, oxyCODONE  Allergies:   No Known Allergies  Social  History:   Social History   Socioeconomic History  . Marital status: Married    Spouse name: Not on file  . Number of children: Not on file  . Years of education: Not on file  . Highest education level: Not on file  Occupational History  . Not on file  Social Needs  . Financial resource strain: Patient refused  . Food insecurity:    Worry: Patient refused    Inability: Patient refused  . Transportation needs:    Medical: Patient refused    Non-medical: Patient refused  Tobacco Use  . Smoking status: Current Every Day Smoker    Packs/day: 1.00      Types: Cigarettes  . Smokeless tobacco: Never Used  Substance and Sexual Activity  . Alcohol use: No    Alcohol/week: 0.0 oz    Frequency: Never    Comment: patient states he has not consumed alcohol in the last month  . Drug use: No  . Sexual activity: Yes  Lifestyle  . Physical activity:    Days per week: Patient refused    Minutes per session: Patient refused  . Stress: Patient refused  Relationships  . Social connections:    Talks on phone: Patient refused    Gets together: Patient refused    Attends religious service: Patient refused    Active member of club or organization: Patient refused    Attends meetings of clubs or organizations: Patient refused    Relationship status: Married  . Intimate partner violence:    Fear of current or ex partner: Patient refused    Emotionally abused: Patient refused    Physically abused: Patient refused    Forced sexual activity: Patient refused  Other Topics Concern  . Not on file  Social History Narrative  . Not on file    Family History:   Family History  Problem Relation Age of Onset  . Stroke Mother        Low BP stroke  . Depression Father   . Depression Sister        Breast  . Heart disease Unknown   . Breast cancer Unknown   . Lung cancer Unknown   . Ovarian cancer Unknown   . Stomach cancer Unknown      ROS:  Please see the history of present illness. All other ROS reviewed and negative.     Physical Exam/Data:   Vitals:   12/29/17 0200 12/29/17 0225 12/29/17 0509 12/29/17 0913  BP: (!) 150/73 (!) 172/91 (!) 152/81 (!) 156/71  Pulse: 70 66 77 78  Resp: 13 16 18 16   Temp:  (!) 97.4 F (36.3 C) 97.8 F (36.6 C) 98.3 F (36.8 C)  TempSrc:  Oral Oral Oral  SpO2: 95% 100% 95% 95%  Weight:  190 lb 12.8 oz (86.5 kg)    Height:  5\' 7"  (1.702 m)      Intake/Output Summary (Last 24 hours) at 12/29/2017 1645 Last data filed at 12/29/2017 1550 Gross per 24 hour  Intake -  Output 1100 ml  Net -1100 ml    Filed Weights   12/28/17 1410 12/29/17 0225  Weight: 175 lb (79.4 kg) 190 lb 12.8 oz (86.5 kg)   Body mass index is 29.88 kg/m.  General:  Well nourished, well developed, in no acute distress.  He is accompanied by his wife. HEENT: No scleral icterus.  Conjunctival pallor is noted.  OP clear.  Moist mucous membranes. Lymph: no adenopathy Neck: JVP approximately 8-10 cm  with positive HJR. Endocrine:  No thryomegaly Vascular: No carotid bruits; 2+ radial and pedal pulses bilaterally. Cardiac: Regular rate and rhythm without murmurs, rubs, or gallops. Lungs: Mildly diminished breath sounds throughout without wheezes or crackles.  Normal work of breathing. Abd: Soft with mild right upper quadrant tenderness.  No rebound or guarding. Ext: 1+ distal pretibial edema bilaterally. Musculoskeletal:  No deformities, BUE and BLE strength normal and equal Skin: warm and dry  Neuro:  CNs 2-12 intact, no focal abnormalities noted Psych:  Normal affect   EKG:  The EKG was personally reviewed and demonstrates: Normal sinus rhythm without abnormalities.  Relevant CV Studies: LHC/PCI ([facility], 09/06/1996): LMCA normal.  LAD with discrete 95% mid vessel stenosis.  LCx normal.  RCA not engaged.  Successful PCI to the mid LAD using a Multi-Link 3.0 x 15 mm bare-metal stent.  Laboratory Data:  Chemistry Recent Labs  Lab 12/28/17 1419 12/29/17 0531  NA 138 141  K 4.2 3.8  CL 109 113*  CO2 23 23  GLUCOSE 227* 68  BUN 45* 43*  CREATININE 2.45* 2.37*  CALCIUM 7.9* 7.7*  GFRNONAA 27* 28*  GFRAA 31* 32*  ANIONGAP 6 5    No results for input(s): PROT, ALBUMIN, AST, ALT, ALKPHOS, BILITOT in the last 168 hours. Hematology Recent Labs  Lab 12/28/17 1419 12/29/17 0531  WBC 6.7 4.8  RBC 3.52* 3.38*  HGB 9.9* 9.5*  HCT 30.2* 29.3*  MCV 85.8 86.8  MCH 28.2 28.2  MCHC 32.9 32.5  RDW 20.3* 20.2*  PLT 171 155   Cardiac Enzymes Recent Labs  Lab 12/28/17 1419 12/28/17 1731 12/29/17 0531   TROPONINI <0.03 <0.03 <0.03   No results for input(s): TROPIPOC in the last 168 hours.  BNPNo results for input(s): BNP, PROBNP in the last 168 hours.  DDimer No results for input(s): DDIMER in the last 168 hours.  Radiology/Studies:  Dg Chest 2 View  Result Date: 12/28/2017 CLINICAL DATA:  Onset of chest pressure and shortness of breath last night which have persisted today. No other symptoms. EXAM: CHEST - 2 VIEW COMPARISON:  CT scan of the chest of April 18, 2014 FINDINGS: The lungs are well-expanded. The interstitial markings are coarse. There is small bilateral pleural effusions blunting the posterior costophrenic angles. The heart is normal in size. The mediastinum is normal in width. There is calcification in the wall of the aortic arch. The bony thorax is unremarkable. IMPRESSION: COPD. No alveolar pneumonia nor pulmonary edema. Trace bilateral pleural effusions of uncertain etiology. Thoracic aortic atherosclerosis. Electronically Signed   By: David  Martinique M.D.   On: 12/28/2017 15:02    Assessment and Plan:  Unstable angina Symptoms are certainly concerning for unstable angina, given new exertional chest pain over the last month accompanied by pain at rest yesterday.  Fortunately, Mr. Ebrahimi's EKG and troponins are normal.  Given remote bare-metal stent placement, he is certainly at risk for in-stent restenosis or development of a de novo lesion.  Given his renal insufficiency, however, I am hesitant to proceed directly to cardiac catheterization.  We have agreed to obtain a pharmacologic myocardial perfusion stress test tomorrow in order to assess for high risk ischemia.  If there is only a small abnormality, I think it would be reasonable to treat with aggressive medical therapy.  However, if a sizable defect is present, we would need to proceed with cardiac catheterization with judicious contrast use.  I recommend adding aspirin, statin, and beta-blocker for suspected coronary artery  disease.  Shortness of breath and edema I suspect that this may have been exacerbated by recent steroid use and fluid retention.  Mr. Soule describes some symptoms of heart failure, including edema and exertional dyspnea.  Echocardiogram has been ordered.  I think it would be reasonable to diurese the patient carefully, given exam findings and history consistent with volume overload.  We will need to monitor his renal function closely.  Chronic kidney disease Creatinine is notably elevated at 2.4, slightly increased from earlier this year.  I recommend holding lisinopril for the time being and gently diuresing Mr. Alferd Apa.  I recommend involving nephrology, as there is a reasonable likelihood that Mr. Demarest will need to receive iodinated contrast for coronary angiography during this admission.  Myelofibrosis Moderate anemia with hemoglobin of 9.5 noted earlier today.  Mr. Maston denies significant bleeding.  It appears that this is near his baseline hemoglobin dating back to February.  We will need to monitor this closely.  Involvement of he [place] would be helpful, particularly if PCI is necessary.  Hypertension Hold lisinopril in the setting of worsening renal function and ongoing diuresis.  Continue amlodipine.  Add carvedilol with uptitration as heart rate and blood pressure allow.  For questions or updates, please contact [place] Please consult [email or web address] for contact info under [facility].   Signed, Nelva Bush, MD  12/29/2017 4:45 PM

## 2017-12-29 NOTE — Progress Notes (Signed)
Pt and pts wife refusing to be consulted by Dr. Josefa Half Dr. Estanislado Pandy made aware/ consult changed to [facility]

## 2017-12-30 ENCOUNTER — Encounter: Payer: Self-pay | Admitting: *Deleted

## 2017-12-30 ENCOUNTER — Telehealth: Payer: Self-pay | Admitting: Internal Medicine

## 2017-12-30 ENCOUNTER — Other Ambulatory Visit: Payer: Self-pay | Admitting: Internal Medicine

## 2017-12-30 ENCOUNTER — Observation Stay (HOSPITAL_BASED_OUTPATIENT_CLINIC_OR_DEPARTMENT_OTHER)
Admit: 2017-12-30 | Discharge: 2017-12-30 | Disposition: A | Discharge status: Home / Self Care | Payer: Medicare HMO | Attending: Internal Medicine | Admitting: Internal Medicine

## 2017-12-30 ENCOUNTER — Encounter (HOSPITAL_COMMUNITY): Payer: Self-pay | Admitting: Internal Medicine

## 2017-12-30 ENCOUNTER — Observation Stay (HOSPITAL_BASED_OUTPATIENT_CLINIC_OR_DEPARTMENT_OTHER): Payer: Medicare HMO

## 2017-12-30 DIAGNOSIS — N183 Chronic kidney disease, stage 3 unspecified: Secondary | ICD-10-CM

## 2017-12-30 DIAGNOSIS — D631 Anemia in chronic kidney disease: Secondary | ICD-10-CM

## 2017-12-30 DIAGNOSIS — R079 Chest pain, unspecified: Secondary | ICD-10-CM | POA: Diagnosis not present

## 2017-12-30 DIAGNOSIS — E785 Hyperlipidemia, unspecified: Secondary | ICD-10-CM | POA: Diagnosis not present

## 2017-12-30 DIAGNOSIS — I200 Unstable angina: Secondary | ICD-10-CM | POA: Diagnosis not present

## 2017-12-30 DIAGNOSIS — I2510 Atherosclerotic heart disease of native coronary artery without angina pectoris: Secondary | ICD-10-CM | POA: Diagnosis not present

## 2017-12-30 DIAGNOSIS — I351 Nonrheumatic aortic (valve) insufficiency: Secondary | ICD-10-CM | POA: Diagnosis not present

## 2017-12-30 DIAGNOSIS — I10 Essential (primary) hypertension: Secondary | ICD-10-CM | POA: Diagnosis not present

## 2017-12-30 DIAGNOSIS — J432 Centrilobular emphysema: Secondary | ICD-10-CM | POA: Diagnosis not present

## 2017-12-30 DIAGNOSIS — I340 Nonrheumatic mitral (valve) insufficiency: Secondary | ICD-10-CM | POA: Diagnosis not present

## 2017-12-30 DIAGNOSIS — E782 Mixed hyperlipidemia: Secondary | ICD-10-CM

## 2017-12-30 DIAGNOSIS — N185 Chronic kidney disease, stage 5: Secondary | ICD-10-CM | POA: Insufficient documentation

## 2017-12-30 LAB — NM MYOCAR MULTI W/SPECT W/WALL MOTION / EF
CHL CUP NUCLEAR SDS: 0
CHL CUP RESTING HR STRESS: 96 {beats}/min
LV dias vol: 144 mL (ref 62–150)
LV sys vol: 69 mL
NUC STRESS TID: 1.07
Peak HR: 109 {beats}/min
SRS: 3
SSS: 1

## 2017-12-30 LAB — ECHOCARDIOGRAM COMPLETE
Height: 67 in
WEIGHTICAEL: 3052.8 [oz_av]

## 2017-12-30 LAB — HIV ANTIBODY (ROUTINE TESTING W REFLEX): HIV SCREEN 4TH GENERATION: NONREACTIVE

## 2017-12-30 LAB — BASIC METABOLIC PANEL
ANION GAP: 5 (ref 5–15)
BUN: 43 mg/dL — ABNORMAL HIGH (ref 6–20)
CHLORIDE: 108 mmol/L (ref 101–111)
CO2: 26 mmol/L (ref 22–32)
Calcium: 8 mg/dL — ABNORMAL LOW (ref 8.9–10.3)
Creatinine, Ser: 2.48 mg/dL — ABNORMAL HIGH (ref 0.61–1.24)
GFR calc Af Amer: 31 mL/min — ABNORMAL LOW (ref >60)
GFR calc non Af Amer: 26 mL/min — ABNORMAL LOW (ref >60)
GLUCOSE: 87 mg/dL (ref 65–99)
POTASSIUM: 3.9 mmol/L (ref 3.5–5.1)
Sodium: 139 mmol/L (ref 135–145)

## 2017-12-30 LAB — LIPID PANEL
CHOLESTEROL: 254 mg/dL — AB (ref 0–200)
HDL: 33 mg/dL — ABNORMAL LOW (ref >40)
LDL Cholesterol: 171 mg/dL — ABNORMAL HIGH (ref 0–99)
TRIGLYCERIDES: 251 mg/dL — AB (ref <150)
Total CHOL/HDL Ratio: 7.7 RATIO
VLDL: 50 mg/dL — ABNORMAL HIGH (ref 0–40)

## 2017-12-30 MED ORDER — ATORVASTATIN CALCIUM 40 MG PO TABS: 40.0000 mg | ORAL_TABLET | Freq: Every day | ORAL | 1 refills | Status: DC

## 2017-12-30 MED ORDER — REGADENOSON 0.4 MG/5ML IV SOLN
0.4000 mg | Freq: Once | INTRAVENOUS | Status: AC
  Administered 2017-12-30: 0.4 mg via INTRAVENOUS
  Filled 2017-12-30: qty 5

## 2017-12-30 MED ORDER — AMLODIPINE BESYLATE 5 MG PO TABS: 5.0000 mg | ORAL_TABLET | Freq: Every day | ORAL | 1 refills | Status: DC

## 2017-12-30 MED ORDER — ASPIRIN 81 MG PO TBEC: 81.0000 mg | DELAYED_RELEASE_TABLET | Freq: Every day | ORAL | 1 refills | Status: AC

## 2017-12-30 MED ORDER — FERROUS SULFATE 325 (65 FE) MG PO TABS: 325.0000 mg | ORAL_TABLET | Freq: Every day | ORAL | 1 refills | Status: DC

## 2017-12-30 MED ORDER — TECHNETIUM TC 99M TETROFOSMIN IV KIT
32.7500 | PACK | Freq: Once | INTRAVENOUS | Status: AC | PRN
  Administered 2017-12-30: 32.75 via INTRAVENOUS

## 2017-12-30 MED ORDER — CARVEDILOL 3.125 MG PO TABS: 3.1250 mg | ORAL_TABLET | Freq: Two times a day (BID) | ORAL | 1 refills | Status: DC

## 2017-12-30 MED ORDER — NICOTINE 21 MG/24HR TD PT24: 21.0000 mg | MEDICATED_PATCH | Freq: Every day | TRANSDERMAL | 0 refills | Status: DC

## 2017-12-30 MED ORDER — TECHNETIUM TC 99M TETROFOSMIN IV KIT
13.1700 | PACK | Freq: Once | INTRAVENOUS | Status: AC | PRN
Start: 2017-12-30 — End: 2017-12-30
  Administered 2017-12-30: 13.17 via INTRAVENOUS

## 2017-12-30 NOTE — Progress Notes (Signed)
Met the patient at the request of family.  Patient currently admitted the hospital for chest pain/shortness of breath improved.  Cardiology evaluation ongoing.  Reviewed the bone marrow biopsy-shows myelofibrosis.  Anemia hemoglobin 9-multifactorial/myelofibrosis chronic kidney disease.  Recommend iron pills once a day.   The next follow-up in approximately 2 weeks/for possible IV iron.  Discussed with the patient/wife- they agree.

## 2017-12-30 NOTE — Addendum Note (Signed)
Addended by: Sabino Gasser on: 12/30/2017 01:45 PM   Modules accepted: Orders

## 2017-12-30 NOTE — Progress Notes (Signed)
*  PRELIMINARY RESULTS* Echocardiogram 2D Echocardiogram has been performed.  James Arnold 12/30/2017, 2:05 PM

## 2017-12-30 NOTE — Progress Notes (Signed)
Progress Note  Patient Name: James Arnold Date of Encounter: 12/30/2017  Primary Cardiologist: [facility] - [facility]  Subjective   Chest pain resolved overnight Feels back to baseline this morning Long history of smoking, chronic thick cough consistent with bronchitis Cardiac enzymes reviewed with him, all normal Stress test this morning  Inpatient Medications    Scheduled Meds: . amLODipine  5 mg Oral Daily  . aspirin EC  81 mg Oral Daily  . atorvastatin  40 mg Oral q1800  . carvedilol  3.125 mg Oral BID WC  . citalopram  20 mg Oral Daily  . heparin  5,000 Units Subcutaneous Q8H  . nicotine  21 mg Transdermal Daily   Continuous Infusions:  PRN Meds: acetaminophen **OR** acetaminophen, ondansetron **OR** ondansetron (ZOFRAN) IV, oxyCODONE   Vital Signs    Vitals:   12/29/17 1707 12/29/17 1947 12/30/17 0343 12/30/17 0700  BP: (!) 167/76 (!) 162/86 (!) 177/73 (!) 174/83  Pulse: 87 90 99 96  Resp: 14 18 19 18   Temp:  98.2 F (36.8 C) 98.3 F (36.8 C) 98.1 F (36.7 C)  TempSrc:  Oral Oral Oral  SpO2: 98% 96% 96% 97%  Weight:      Height:    5\' 7"  (1.702 m)    Intake/Output Summary (Last 24 hours) at 12/30/2017 1029 Last data filed at 12/30/2017 0539 Gross per 24 hour  Intake 240 ml  Output 3375 ml  Net -3135 ml   Filed Weights   12/28/17 1410 12/29/17 0225  Weight: 175 lb (79.4 kg) 190 lb 12.8 oz (86.5 kg)    Telemetry    NSR - Personally Reviewed  ECG      Physical Exam   Constitutional:  oriented to person, place, and time. No distress.  HENT:  Head: Normocephalic and atraumatic.  Eyes:  no discharge. No scleral icterus.  Neck: Normal range of motion. Neck supple. No JVD present.  Cardiovascular: Normal rate, regular rhythm, normal heart sounds and intact distal pulses. Exam reveals no gallop and no friction rub. No edema No murmur heard. Pulmonary/Chest: Moderately decreased breath sounds throughout,  no stridor. No respiratory distress.  no wheezes.   scattered rales.  no tenderness.  Abdominal: Soft.  no distension.  no tenderness.  Musculoskeletal: Normal range of motion.  no  tenderness or deformity.  Neurological:  normal muscle tone. Coordination normal. No atrophy Skin: Skin is warm and dry. No rash noted. not diaphoretic.  Psychiatric:  normal mood and affect. behavior is normal. Thought content normal.      Labs    Chemistry Recent Labs  Lab 12/28/17 1419 12/29/17 0531 12/30/17 0435  NA 138 141 139  K 4.2 3.8 3.9  CL 109 113* 108  CO2 23 23 26   GLUCOSE 227* 68 87  BUN 45* 43* 43*  CREATININE 2.45* 2.37* 2.48*  CALCIUM 7.9* 7.7* 8.0*  GFRNONAA 27* 28* 26*  GFRAA 31* 32* 31*  ANIONGAP 6 5 5      Hematology Recent Labs  Lab 12/28/17 1419 12/29/17 0531  WBC 6.7 4.8  RBC 3.52* 3.38*  HGB 9.9* 9.5*  HCT 30.2* 29.3*  MCV 85.8 86.8  MCH 28.2 28.2  MCHC 32.9 32.5  RDW 20.3* 20.2*  PLT 171 155    Cardiac Enzymes Recent Labs  Lab 12/28/17 1419 12/28/17 1731 12/29/17 0531  TROPONINI <0.03 <0.03 <0.03   No results for input(s): TROPIPOC in the last 168 hours.   BNPNo results for input(s): BNP, PROBNP in the last  168 hours.   DDimer No results for input(s): DDIMER in the last 168 hours.   Radiology    Dg Chest 2 View  Result Date: 12/28/2017 CLINICAL DATA:  Onset of chest pressure and shortness of breath last night which have persisted today. No other symptoms. EXAM: CHEST - 2 VIEW COMPARISON:  CT scan of the chest of April 18, 2014 FINDINGS: The lungs are well-expanded. The interstitial markings are coarse. There is small bilateral pleural effusions blunting the posterior costophrenic angles. The heart is normal in size. The mediastinum is normal in width. There is calcification in the wall of the aortic arch. The bony thorax is unremarkable. IMPRESSION: COPD. No alveolar pneumonia nor pulmonary edema. Trace bilateral pleural effusions of uncertain etiology. Thoracic aortic atherosclerosis. Electronically  Signed   By: David  Martinique M.D.   On: 12/28/2017 15:02    Cardiac Studies     Patient Profile     James Arnold is a 62 y.o. 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    chronic renal insufficiency we will make cardiac catheterization less attractive --Stress test this morning  aspirin, statin, and beta-blocker  Shortness of breath   recent steroid use , renal failure   Echocardiogram has been ordered. Suspect COPD, at least moderate  Chronic kidney disease Creatinine is  2.4,  May need renal u/s  Myelofibrosis Moderate anemia with hemoglobin of 9.5  Anemia w/u, iron studies May be secondary to renal dysfunction  Hypertension  amlodipine.   carvedilol Hold ACE   Total encounter time more than 35 minutes  Greater than 50% was spent in counseling and coordination of care with the patient   For questions or updates, please contact [place] Please consult [email or web address] for contact info under Cardiology/STEMI.      Signed, Ida Rogue, MD  12/30/2017, 10:29 AM

## 2017-12-30 NOTE — Telephone Encounter (Signed)
Please make an appointment for a follow-up in approximately 2 weeks-MD/ CBC BMP LDH; possible IV Venofer.  Thx

## 2017-12-31 ENCOUNTER — Telehealth: Payer: Self-pay | Admitting: Internal Medicine

## 2017-12-31 NOTE — Discharge Summary (Signed)
[employer] [facility] [place] at [facility], 62 y.o., DOB [birth year], MRN [id number]. Admission date: 12/28/2017 Discharge Date 12/30/2017 Primary MD Valerie Roys, DO Admitting Physician Lance Coon, MD  Admission Diagnosis   1.  Unstable angina 2.  Coronary artery disease 3.  Hypertension 4.  Hyperlipidemia 5.  Chronic kidney disease stage III 6.  History of myelofibrosis  Discharge Diagnosis      1.  Coronary artery disease 2.  Angina resolved 3.  Chronic kidney disease stage III 4.  Hyperlipidemia 5.  History of myelofibrosis  Hospital Course   62 year old male patient with history of coronary artery disease, chronic kidney disease stage III, myelofibrosis, hyperlipidemia was admitted on 12/28/2017 for chest pain.  Patient was admitted to telemetry.  Patient received aspirin, statin and beta-blocker.  Serial troponins were negative.  Echocardiogram showed EF of 60-65% with grade 1 diastolic dysfunction.  Patient was seen by [facility] health cardiology and recommended cardiac stress test..  Pharmacologic myocardial perfusion imaging study was done which showed no myocardial ischemia.  Patient will be discharged home and follow-up with cardiology as outpatient.  Patient was seen by oncology and he was in the hospital and will continue iron supplements and will follow-up in the clinic. Tobacco cessation was counseled to the patient for 6 minutes Nicotine patch was offered Harmful effects of smoking explained          Consults  [facility] Card[facility] Dr Rockey Situ Oncology consultation by Dr. Edwena Bunde  Significant Tests:  See full reports for all details    Dg Chest 2 View  Result Date: 12/28/2017 CLINICAL DATA:  Onset of chest pressure and shortness of breath last night which have persisted today. No other symptoms. EXAM: CHEST - 2 VIEW COMPARISON:  CT scan of the chest of April 18, 2014 FINDINGS: The lungs are well-expanded. The interstitial markings  are coarse. There is small bilateral pleural effusions blunting the posterior costophrenic angles. The heart is normal in size. The mediastinum is normal in width. There is calcification in the wall of the aortic arch. The bony thorax is unremarkable. IMPRESSION: COPD. No alveolar pneumonia nor pulmonary edema. Trace bilateral pleural effusions of uncertain etiology. Thoracic aortic atherosclerosis. Electronically Signed   By: David  Martinique M.D.   On: 12/28/2017 15:02   Nm Myocar Multi W/spect W/wall Motion / Ef  Result Date: 12/30/2017 Pharmacological myocardial perfusion imaging study with no significant ischemia Significant GI uptake artifact, Non-attenuation corrected images with GI uptake, fixed inferior wall consistent with diaphragmatic attenuation, no significant ischemia Attenuation corrected images with fixed apical defect, significant GI uptake artifact, no significant ischemia Normal wall motion, EF estimated at 46% (depressed ejection fraction secondary to significant GI uptake artifact) No EKG changes concerning for ischemia at peak stress or in recovery. Low risk scan Echocardiogram with normal ejection fraction greater than 55% Signed, Esmond Plants, MD, Ph.D [facility]   Ct Biopsy  Result Date: 12/17/2017 INDICATION: 62 year old with myelofibrosis. EXAM: CT GUIDED BONE MARROW ASPIRATES AND BIOPSY Physician: Stephan Minister. Anselm Pancoast, MD MEDICATIONS: None. ANESTHESIA/SEDATION: Fentanyl 100 mcg IV; Versed 3.0 mg IV Moderate Sedation Time:  24 minutes The patient was continuously monitored during the procedure by the interventional radiology nurse under my direct supervision. COMPLICATIONS: None immediate. PROCEDURE: The procedure was explained to the patient. The risks and benefits of the procedure were discussed and the patient's questions were addressed. Informed consent was obtained from the patient. The patient was placed prone on CT table. Images  of the pelvis were obtained. The right side of back  was prepped and draped in sterile fashion. Maximal barrier sterile technique was utilized including caps, mask, sterile gowns, sterile gloves, sterile drape, hand hygiene and skin antiseptic. The skin and right posterior ilium were anesthetized with 1% lidocaine. 11 gauge bone needle was directed into the right ilium with CT guidance. Two aspirates and two core biopsies were obtained. Bandage placed over the puncture site. IMPRESSION: CT guided bone marrow aspiration and core biopsy. Electronically Signed   By: Markus Daft M.D.   On: 12/17/2017 10:46       Today   Subjective:   James Arnold is a 62 year old male patient was seen on the day of discharge No chest pain No shortness of breath No palpitations  Objective:   Blood pressure (!) 172/89, pulse 90, temperature 98.7 F (37.1 C), temperature source Oral, resp. rate 18, height 5\' 7"  (1.702 m), weight 86.5 kg (190 lb 12.8 oz), SpO2 97 %.  . No intake or output data in the 24 hours ending 12/31/17 1842  Exam VITAL SIGNS: Blood pressure (!) 172/89, pulse 90, temperature 98.7 F (37.1 C), temperature source Oral, resp. rate 18, height 5\' 7"  (1.702 m), weight 86.5 kg (190 lb 12.8 oz), SpO2 97 %.  GENERAL:  62 y.o.-year-old patient lying in the bed with no acute distress.  EYES: Pupils equal, round, reactive to light and accommodation. No scleral icterus. Extraocular muscles intact.  HEENT: Head atraumatic, normocephalic. Oropharynx and nasopharynx clear.  NECK:  Supple, no jugular venous distention. No thyroid enlargement, no tenderness.  LUNGS: Normal breath sounds bilaterally, no wheezing, rales,rhonchi or crepitation. No use of accessory muscles of respiration.  CARDIOVASCULAR: S1, S2 normal. No murmurs, rubs, or gallops.  ABDOMEN: Soft, nontender, nondistended. Bowel sounds present. No organomegaly or mass.  EXTREMITIES: No pedal edema, cyanosis, or clubbing.  NEUROLOGIC: Cranial nerves II through XII are intact. Muscle strength 5/5  in all extremities. Sensation intact. Gait not checked.  PSYCHIATRIC: The patient is alert and oriented x 3.  SKIN: No obvious rash, lesion, or ulcer.   Data Review     CBC w Diff:  Lab Results  Component Value Date   WBC 4.8 12/29/2017   HGB 9.5 (L) 12/29/2017   HGB 11.5 (L) 08/27/2017   HCT 29.3 (L) 12/29/2017   HCT 35.6 (L) 08/27/2017   PLT 155 12/29/2017   PLT 120 (L) 08/27/2017   LYMPHOPCT 13 12/17/2017   LYMPHOPCT 19.0 05/12/2014   MONOPCT 4 12/17/2017   MONOPCT 4.0 05/12/2014   EOSPCT 0 12/17/2017   EOSPCT 0.4 05/12/2014   BASOPCT 0 12/17/2017   BASOPCT 0.2 05/12/2014   CMP:  Lab Results  Component Value Date   NA 139 12/30/2017   NA 138 09/25/2017   K 3.9 12/30/2017   CL 108 12/30/2017   CO2 26 12/30/2017   BUN 43 (H) 12/30/2017   BUN 27 09/25/2017   CREATININE 2.48 (H) 12/30/2017   CREATININE 1.12 09/30/2013   PROT 5.6 (L) 12/14/2017   PROT 6.6 09/25/2017   ALBUMIN 3.2 (L) 12/14/2017   ALBUMIN 4.6 09/25/2017   BILITOT 0.7 12/14/2017   BILITOT 0.3 09/25/2017   ALKPHOS 42 12/14/2017   AST 15 12/14/2017   ALT 16 (L) 12/14/2017  .  Micro Results No results found for this or any previous visit (from the past 240 hour(s)).   Code Status History    Date Active Date Inactive Code Status Order ID Comments User Context  12/29/2017 0223 12/30/2017 2003 Full Code [id number]  Lance Coon, MD Inpatient   11/25/2017 1532 11/26/2017 1607 Full Code [id number]  Anthonette Legato, MD Inpatient          Follow-up Information    Park Liter P, DO Follow up in 1 week(s).   Specialty:  Family Medicine Contact information: [place] [address] [phone]        Minna Merritts, MD Follow up in 2 week(s).   Specialty:  Cardiology Contact information: [place] [address] [phone]        Cammie Sickle, MD Follow up in 2 week(s).   Specialties:  Internal Medicine, Oncology Contact information: [place] [address] [phone]           Discharge Medications   Allergies as of 12/30/2017   No Known Allergies     Medication List    STOP taking these medications   lisinopril 20 MG tablet Commonly known as:  PRINIVIL,ZESTRIL     TAKE these medications   allopurinol 100 MG tablet Commonly known as:  ZYLOPRIM Take 0.5 tablets (50 mg total) by mouth daily.   amLODipine 5 MG tablet Commonly known as:  NORVASC Take 1 tablet (5 mg total) by mouth daily. What changed:  See the new instructions.   aspirin 81 MG EC tablet Take 1 tablet (81 mg total) by mouth daily.   atorvastatin 40 MG tablet Commonly known as:  LIPITOR Take 1 tablet (40 mg total) by mouth daily at 6 PM.   carvedilol 3.125 MG tablet Commonly known as:  COREG Take 1 tablet (3.125 mg total) by mouth 2 (two) times daily with a meal.   citalopram 20 MG tablet Commonly known as:  CELEXA Take 1 tablet (20 mg total) by mouth daily.   ferrous sulfate 325 (65 FE) MG tablet Take 1 tablet (325 mg total) by mouth daily with breakfast.   nicotine 21 mg/24hr patch Commonly known as:  NICODERM CQ - dosed in mg/24 hours Place 1 patch (21 mg total) onto the skin daily.   predniSONE 10 MG tablet Commonly known as:  DELTASONE Take 60 mg by mouth daily.          Total Time in preparing paper work, data evaluation and todays exam - 35 minutes  Saundra Shelling M.D on 12/31/2017 at [place]  [phone]

## 2017-12-31 NOTE — Telephone Encounter (Signed)
[facility] ph [facility] for chest pain   Scheduled next available with berge 5/9 at 8 am   Added to waitlist

## 2017-12-31 NOTE — Telephone Encounter (Signed)
Patient's wife, ok per dpr, contacted regarding discharge from [facility] on 12/30/17.   Patient understands to follow up with provider ? On 01/28/18 at 0800 at [place].  Patient understands discharge instructions? Yes  Patient understands medications and regiment? Yes  Patient understands to bring all medications to this visit? Yes

## 2018-01-01 ENCOUNTER — Telehealth: Payer: Self-pay

## 2018-01-01 NOTE — Telephone Encounter (Signed)
Transition Care Management Follow-up Telephone Call  Spoke with patients wife.    Date discharged? 12/30/2017   How have you been since you were released from the hospital? "feeling better today"   Do you understand why you were in the hospital? yes   Do you understand the discharge instructions? yes   Where were you discharged to? home   Items Reviewed:  Medications reviewed: yes  Allergies reviewed: yes  Dietary changes reviewed: yes  Referrals reviewed: yes   Functional Questionnaire:   Activities of Daily Living (ADLs):   He states they are independent in the following: ambulation, bathing and hygiene, feeding, continence, grooming, toileting and dressing States they require assistance with the following: none   Any transportation issues/concerns?: no   Any patient concerns? No - is going to try and stop smoking. Looking into patches to help.    Confirmed importance and date/time of follow-up visits scheduled yes  Provider Appointment booked with Dr.Johnson on 01/07/2018 at 8:00am   Confirmed with patient if condition begins to worsen call PCP or go to the ER.  Patient was given the office number and encouraged to call back with question or concerns.  : yes

## 2018-01-04 LAB — CHROMOSOME ANALYSIS, BONE MARROW

## 2018-01-07 ENCOUNTER — Ambulatory Visit (INDEPENDENT_AMBULATORY_CARE_PROVIDER_SITE_OTHER): Payer: Medicare HMO | Admitting: Family Medicine

## 2018-01-07 ENCOUNTER — Encounter: Payer: Self-pay | Admitting: Family Medicine

## 2018-01-07 VITALS — BP 186/91 | HR 77 | Temp 97.8°F | Wt 182.3 lb

## 2018-01-07 DIAGNOSIS — N183 Chronic kidney disease, stage 3 unspecified: Secondary | ICD-10-CM

## 2018-01-07 DIAGNOSIS — I129 Hypertensive chronic kidney disease with stage 1 through stage 4 chronic kidney disease, or unspecified chronic kidney disease: Secondary | ICD-10-CM | POA: Diagnosis not present

## 2018-01-07 DIAGNOSIS — I2510 Atherosclerotic heart disease of native coronary artery without angina pectoris: Secondary | ICD-10-CM

## 2018-01-07 DIAGNOSIS — N179 Acute kidney failure, unspecified: Secondary | ICD-10-CM | POA: Diagnosis not present

## 2018-01-07 NOTE — Assessment & Plan Note (Signed)
Was in ARF at the hospital. Lisinopril held while diuresing, but has not been restarted. Will recheck BMP and if back to baseline, will restart lisinopril. Follow up with nephrology next week.

## 2018-01-07 NOTE — Progress Notes (Signed)
BP (!) 186/91 (BP Location: Left Arm, Patient Position: Sitting, Cuff Size: Normal)   Pulse 77   Temp 97.8 F (36.6 C)   Wt 182 lb 5 oz (82.7 kg)   SpO2 99%   BMI 28.55 kg/m    Subjective:    [name], male    DOB: [birth year], 62 y.o.   MRN: [id number]  HPI: James Arnold is a 62 y.o. male  Chief Complaint  Patient presents with  . Hospitalization Follow-up   HOSPITAL FOLLOW UP Time since discharge: 8 days Hospital/facility: [facility] Diagnosis: Unstable Angina, CAD Procedures/tests: Myoview Stress Test Consultants: Oncology, cardiology New medications:  Medication List    STOP taking these medications   lisinopril 20 MG tablet Commonly known as:  PRINIVIL,ZESTRIL     TAKE these medications   allopurinol 100 MG tablet Commonly known as:  ZYLOPRIM Take 0.5 tablets (50 mg total) by mouth daily.   amLODipine 5 MG tablet Commonly known as:  NORVASC Take 1 tablet (5 mg total) by mouth daily. What changed:  See the new instructions.   aspirin 81 MG EC tablet Take 1 tablet (81 mg total) by mouth daily.   atorvastatin 40 MG tablet Commonly known as:  LIPITOR Take 1 tablet (40 mg total) by mouth daily at 6 PM.   carvedilol 3.125 MG tablet Commonly known as:  COREG Take 1 tablet (3.125 mg total) by mouth 2 (two) times daily with a meal.   citalopram 20 MG tablet Commonly known as:  CELEXA Take 1 tablet (20 mg total) by mouth daily.   ferrous sulfate 325 (65 FE) MG tablet Take 1 tablet (325 mg total) by mouth daily with breakfast.   nicotine 21 mg/24hr patch Commonly known as:  NICODERM CQ - dosed in mg/24 hours Place 1 patch (21 mg total) onto the skin daily.   predniSONE 10 MG tablet Commonly known as:  DELTASONE Take 60 mg by mouth daily.    Discharge instructions:  Follow up with cardiology Status: better- occasional small amounts of chest pain with exertion  HYPERTENSION Hypertension status: uncontrolled  Satisfied with  current treatment? no Duration of hypertension: chronic BP monitoring frequency:  not checking BP medication side effects:  no Medication compliance: excellent compliance Previous BP meds: amlodipine, carvedilol (lisinopril held in hospiral and never restarted) Aspirin: yes Recurrent headaches: no Visual changes: no Palpitations: no Dyspnea: no Chest pain: yes Lower extremity edema: no Dizzy/lightheaded: no  Relevant past medical, surgical, family and social history reviewed and updated as indicated. Interim medical history since our last visit reviewed. Allergies and medications reviewed and updated.  Review of Systems  Constitutional: Negative.   Respiratory: Negative.   Cardiovascular: Positive for chest pain. Negative for palpitations and leg swelling.  Psychiatric/Behavioral: Negative.     Per HPI unless specifically indicated above     Objective:    BP (!) 186/91 (BP Location: Left Arm, Patient Position: Sitting, Cuff Size: Normal)   Pulse 77   Temp 97.8 F (36.6 C)   Wt 182 lb 5 oz (82.7 kg)   SpO2 99%   BMI 28.55 kg/m   Wt Readings from Last 3 Encounters:  01/07/18 182 lb 5 oz (82.7 kg)  12/29/17 190 lb 12.8 oz (86.5 kg)  12/14/17 185 lb 2 oz (84 kg)    Physical Exam  Constitutional: He is oriented to person, place, and time. He appears well-developed and well-nourished. No distress.  HENT:  Head: Normocephalic and atraumatic.  Right  Ear: Hearing normal.  Left Ear: Hearing normal.  Nose: Nose normal.  Eyes: Conjunctivae and lids are normal. Right eye exhibits no discharge. Left eye exhibits no discharge. No scleral icterus.  Cardiovascular: Normal rate, regular rhythm, normal heart sounds and intact distal pulses. Exam reveals no gallop and no friction rub.  No murmur heard. Pulmonary/Chest: Effort normal and breath sounds normal. No stridor. No respiratory distress. He has no wheezes. He has no rales.  Musculoskeletal: Normal range of motion.    Neurological: He is alert and oriented to person, place, and time.  Skin: Skin is warm, dry and intact. Capillary refill takes less than 2 seconds. No rash noted. He is not diaphoretic. No erythema. No pallor.  Psychiatric: He has a normal mood and affect. His speech is normal and behavior is normal. Judgment and thought content normal. Cognition and memory are normal.    Results for orders placed or performed during the hospital encounter of 12/28/17  NM Myocar Multi W/Spect W/Wall Motion / EF  Result Value Ref Range   Rest HR 96 bpm   Rest BP 164/76 mmHg   Peak HR 109 bpm   Peak BP 170/81 mmHg   SSS 1    SRS 3    SDS 0    TID 1.07    LV sys vol 69 mL   LV dias vol 144 62 - 150 mL  Basic metabolic panel  Result Value Ref Range   Sodium 138 135 - 145 mmol/L   Potassium 4.2 3.5 - 5.1 mmol/L   Chloride 109 101 - 111 mmol/L   CO2 23 22 - 32 mmol/L   Glucose, Bld 227 (H) 65 - 99 mg/dL   BUN 45 (H) 6 - 20 mg/dL   Creatinine, Ser 2.45 (H) 0.61 - 1.24 mg/dL   Calcium 7.9 (L) 8.9 - 10.3 mg/dL   GFR calc non Af Amer 27 (L) >60 mL/min   GFR calc Af Amer 31 (L) >60 mL/min   Anion gap 6 5 - 15  CBC  Result Value Ref Range   WBC 6.7 3.8 - 10.6 K/uL   RBC 3.52 (L) 4.40 - 5.90 MIL/uL   Hemoglobin 9.9 (L) 13.0 - 18.0 g/dL   HCT 30.2 (L) 40.0 - 52.0 %   MCV 85.8 80.0 - 100.0 fL   MCH 28.2 26.0 - 34.0 pg   MCHC 32.9 32.0 - 36.0 g/dL   RDW 20.3 (H) 11.5 - 14.5 %   Platelets 171 150 - 440 K/uL  Troponin I  Result Value Ref Range   Troponin I <0.03 <0.03 ng/mL  Troponin I  Result Value Ref Range   Troponin I <0.03 <0.03 ng/mL  HIV antibody (Routine Testing)  Result Value Ref Range   HIV Screen 4th Generation wRfx Non Reactive Non Reactive  Troponin I  Result Value Ref Range   Troponin I <0.03 <0.03 ng/mL  Basic metabolic panel  Result Value Ref Range   Sodium 141 135 - 145 mmol/L   Potassium 3.8 3.5 - 5.1 mmol/L   Chloride 113 (H) 101 - 111 mmol/L   CO2 23 22 - 32 mmol/L    Glucose, Bld 68 65 - 99 mg/dL   BUN 43 (H) 6 - 20 mg/dL   Creatinine, Ser 2.37 (H) 0.61 - 1.24 mg/dL   Calcium 7.7 (L) 8.9 - 10.3 mg/dL   GFR calc non Af Amer 28 (L) >60 mL/min   GFR calc Af Amer 32 (L) >60 mL/min  Anion gap 5 5 - 15  CBC  Result Value Ref Range   WBC 4.8 3.8 - 10.6 K/uL   RBC 3.38 (L) 4.40 - 5.90 MIL/uL   Hemoglobin 9.5 (L) 13.0 - 18.0 g/dL   HCT 29.3 (L) 40.0 - 52.0 %   MCV 86.8 80.0 - 100.0 fL   MCH 28.2 26.0 - 34.0 pg   MCHC 32.5 32.0 - 36.0 g/dL   RDW 20.2 (H) 11.5 - 14.5 %   Platelets 155 150 - 440 K/uL  Lipid panel  Result Value Ref Range   Cholesterol 254 (H) 0 - 200 mg/dL   Triglycerides 251 (H) <150 mg/dL   HDL 33 (L) >40 mg/dL   Total CHOL/HDL Ratio 7.7 RATIO   VLDL 50 (H) 0 - 40 mg/dL   LDL Cholesterol 171 (H) 0 - 99 mg/dL  Basic metabolic panel  Result Value Ref Range   Sodium 139 135 - 145 mmol/L   Potassium 3.9 3.5 - 5.1 mmol/L   Chloride 108 101 - 111 mmol/L   CO2 26 22 - 32 mmol/L   Glucose, Bld 87 65 - 99 mg/dL   BUN 43 (H) 6 - 20 mg/dL   Creatinine, Ser 2.48 (H) 0.61 - 1.24 mg/dL   Calcium 8.0 (L) 8.9 - 10.3 mg/dL   GFR calc non Af Amer 26 (L) >60 mL/min   GFR calc Af Amer 31 (L) >60 mL/min   Anion gap 5 5 - 15  ECHOCARDIOGRAM COMPLETE  Result Value Ref Range   Weight 3,052.8 oz   Height 67 in   BP 174/83 mmHg      Assessment & Plan:   Problem List Items Addressed This Visit      Cardiovascular and Mediastinum   CAD (coronary artery disease)    Occasional chest pains with exertion, Myoview normal. Need to get BP under better control. Will follow up with cardiology on 01/28/18        Genitourinary   Benign hypertensive renal disease    Not under good control. No longer being diuresed. Will check BMP today- if back to baseline will restart lisinopril, if not, will increase his amlodipine. Recheck 2 weeks.       CKD (chronic kidney disease), stage III ([place])    Was in ARF at the hospital. Lisinopril held while diuresing, but  has not been restarted. Will recheck BMP and if back to baseline, will restart lisinopril. Follow up with nephrology next week.       Relevant Orders   Basic metabolic panel   Acute renal failure (ARF) ([place]) - Primary    Was in ARF at the hospital. Lisinopril held while diuresing, but has not been restarted. Will recheck BMP and if back to baseline, will restart lisinopril. Follow up with nephrology next week.       Relevant Orders   Basic metabolic panel       Follow up plan: Return in about 2 weeks (around 01/21/2018) for follow up BP.

## 2018-01-07 NOTE — Assessment & Plan Note (Signed)
Not under good control. No longer being diuresed. Will check BMP today- if back to baseline will restart lisinopril, if not, will increase his amlodipine. Recheck 2 weeks.

## 2018-01-07 NOTE — Assessment & Plan Note (Signed)
Occasional chest pains with exertion, Myoview normal. Need to get BP under better control. Will follow up with cardiology on 01/28/18

## 2018-01-08 ENCOUNTER — Telehealth: Payer: Self-pay | Admitting: Family Medicine

## 2018-01-08 ENCOUNTER — Other Ambulatory Visit: Payer: Self-pay | Admitting: Family Medicine

## 2018-01-08 LAB — BASIC METABOLIC PANEL
BUN / CREAT RATIO: 14 (ref 10–24)
BUN: 34 mg/dL — AB (ref 8–27)
CO2: 20 mmol/L (ref 20–29)
Calcium: 8.2 mg/dL — ABNORMAL LOW (ref 8.6–10.2)
Chloride: 108 mmol/L — ABNORMAL HIGH (ref 96–106)
Creatinine, Ser: 2.5 mg/dL — ABNORMAL HIGH (ref 0.76–1.27)
GFR, EST AFRICAN AMERICAN: 31 mL/min/{1.73_m2} — AB (ref >59)
GFR, EST NON AFRICAN AMERICAN: 27 mL/min/{1.73_m2} — AB (ref >59)
GLUCOSE: 99 mg/dL (ref 65–99)
Potassium: 4.2 mmol/L (ref 3.5–5.2)
SODIUM: 141 mmol/L (ref 134–144)

## 2018-01-08 MED ORDER — AMLODIPINE BESYLATE 10 MG PO TABS: 10.0000 mg | ORAL_TABLET | Freq: Every day | ORAL | 3 refills | Status: DC

## 2018-01-08 NOTE — Telephone Encounter (Signed)
Patients wife notified

## 2018-01-08 NOTE — Telephone Encounter (Signed)
Please let him know that his kidney function didn't come back great, so I'd like him to take 2 of his amlodipines to try to bring his BP down. I've sent a new Rx over to his pharmacy for the 10mg , but he can take 2 of the 5mg  until he uses it up.

## 2018-01-11 DIAGNOSIS — I10 Essential (primary) hypertension: Secondary | ICD-10-CM | POA: Diagnosis not present

## 2018-01-11 DIAGNOSIS — N041 Nephrotic syndrome with focal and segmental glomerular lesions: Secondary | ICD-10-CM | POA: Diagnosis not present

## 2018-01-11 DIAGNOSIS — D631 Anemia in chronic kidney disease: Secondary | ICD-10-CM | POA: Diagnosis not present

## 2018-01-11 DIAGNOSIS — N183 Chronic kidney disease, stage 3 (moderate): Secondary | ICD-10-CM | POA: Diagnosis not present

## 2018-01-14 ENCOUNTER — Encounter: Payer: Self-pay | Admitting: Nephrology

## 2018-01-15 ENCOUNTER — Inpatient Hospital Stay: Payer: Medicare HMO | Attending: Internal Medicine

## 2018-01-15 ENCOUNTER — Inpatient Hospital Stay (HOSPITAL_BASED_OUTPATIENT_CLINIC_OR_DEPARTMENT_OTHER): Payer: Medicare HMO | Admitting: Internal Medicine

## 2018-01-15 ENCOUNTER — Other Ambulatory Visit: Payer: Self-pay

## 2018-01-15 ENCOUNTER — Inpatient Hospital Stay: Payer: Medicare HMO

## 2018-01-15 ENCOUNTER — Encounter: Payer: Self-pay | Admitting: Internal Medicine

## 2018-01-15 VITALS — BP 174/94 | HR 72 | Temp 97.2°F | Resp 20

## 2018-01-15 DIAGNOSIS — Z801 Family history of malignant neoplasm of trachea, bronchus and lung: Secondary | ICD-10-CM

## 2018-01-15 DIAGNOSIS — Z8041 Family history of malignant neoplasm of ovary: Secondary | ICD-10-CM

## 2018-01-15 DIAGNOSIS — D471 Chronic myeloproliferative disease: Secondary | ICD-10-CM | POA: Insufficient documentation

## 2018-01-15 DIAGNOSIS — Z803 Family history of malignant neoplasm of breast: Secondary | ICD-10-CM | POA: Diagnosis not present

## 2018-01-15 DIAGNOSIS — D7581 Myelofibrosis: Secondary | ICD-10-CM

## 2018-01-15 DIAGNOSIS — D649 Anemia, unspecified: Secondary | ICD-10-CM | POA: Insufficient documentation

## 2018-01-15 DIAGNOSIS — I509 Heart failure, unspecified: Secondary | ICD-10-CM | POA: Insufficient documentation

## 2018-01-15 DIAGNOSIS — Z7982 Long term (current) use of aspirin: Secondary | ICD-10-CM

## 2018-01-15 DIAGNOSIS — N183 Chronic kidney disease, stage 3 unspecified: Secondary | ICD-10-CM

## 2018-01-15 DIAGNOSIS — R161 Splenomegaly, not elsewhere classified: Secondary | ICD-10-CM | POA: Diagnosis not present

## 2018-01-15 DIAGNOSIS — I129 Hypertensive chronic kidney disease with stage 1 through stage 4 chronic kidney disease, or unspecified chronic kidney disease: Secondary | ICD-10-CM | POA: Insufficient documentation

## 2018-01-15 DIAGNOSIS — I2510 Atherosclerotic heart disease of native coronary artery without angina pectoris: Secondary | ICD-10-CM | POA: Insufficient documentation

## 2018-01-15 DIAGNOSIS — F329 Major depressive disorder, single episode, unspecified: Secondary | ICD-10-CM

## 2018-01-15 DIAGNOSIS — F17210 Nicotine dependence, cigarettes, uncomplicated: Secondary | ICD-10-CM

## 2018-01-15 DIAGNOSIS — Z87891 Personal history of nicotine dependence: Secondary | ICD-10-CM | POA: Insufficient documentation

## 2018-01-15 DIAGNOSIS — E785 Hyperlipidemia, unspecified: Secondary | ICD-10-CM | POA: Diagnosis not present

## 2018-01-15 DIAGNOSIS — Z79899 Other long term (current) drug therapy: Secondary | ICD-10-CM | POA: Diagnosis not present

## 2018-01-15 DIAGNOSIS — M7989 Other specified soft tissue disorders: Secondary | ICD-10-CM

## 2018-01-15 DIAGNOSIS — D631 Anemia in chronic kidney disease: Secondary | ICD-10-CM

## 2018-01-15 LAB — CBC WITH DIFFERENTIAL/PLATELET
BASOS ABS: 0 10*3/uL (ref 0–0.1)
BASOS PCT: 0 %
EOS ABS: 0 10*3/uL (ref 0–0.7)
EOS PCT: 0 %
HCT: 32 % — ABNORMAL LOW (ref 40.0–52.0)
Hemoglobin: 10.8 g/dL — ABNORMAL LOW (ref 13.0–18.0)
Lymphocytes Relative: 5 %
Lymphs Abs: 0.4 10*3/uL — ABNORMAL LOW (ref 1.0–3.6)
MCH: 29.5 pg (ref 26.0–34.0)
MCHC: 33.9 g/dL (ref 32.0–36.0)
MCV: 87.1 fL (ref 80.0–100.0)
MONO ABS: 0.1 10*3/uL — AB (ref 0.2–1.0)
Monocytes Relative: 2 %
Neutro Abs: 7 10*3/uL — ABNORMAL HIGH (ref 1.4–6.5)
Neutrophils Relative %: 93 %
PLATELETS: 184 10*3/uL (ref 150–440)
RBC: 3.67 MIL/uL — ABNORMAL LOW (ref 4.40–5.90)
RDW: 19 % — AB (ref 11.5–14.5)
WBC: 7.5 10*3/uL (ref 3.8–10.6)

## 2018-01-15 LAB — BASIC METABOLIC PANEL
Anion gap: 8 (ref 5–15)
BUN: 46 mg/dL — AB (ref 6–20)
CALCIUM: 8 mg/dL — AB (ref 8.9–10.3)
CO2: 22 mmol/L (ref 22–32)
CREATININE: 2.91 mg/dL — AB (ref 0.61–1.24)
Chloride: 110 mmol/L (ref 101–111)
GFR calc Af Amer: 25 mL/min — ABNORMAL LOW (ref >60)
GFR calc non Af Amer: 22 mL/min — ABNORMAL LOW (ref >60)
GLUCOSE: 127 mg/dL — AB (ref 65–99)
Potassium: 4.5 mmol/L (ref 3.5–5.1)
SODIUM: 140 mmol/L (ref 135–145)

## 2018-01-15 LAB — LACTATE DEHYDROGENASE: LDH: 556 U/L — ABNORMAL HIGH (ref 98–192)

## 2018-01-15 NOTE — Assessment & Plan Note (Addendum)
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  Currently on surveillance. Repeat BMBx [March 2019]-fibrosis; but no evidence of any blasts/or any features suggestive of transformation.   #Mild to moderate anemia hemoglobin around 10.5-; question CKD versus worsening myelofibrosis [less likely as LDH stable/spleen improving]; no significant evidence of iron deficiency.  If hemoglobin continues to worsen-erythropoietin stimulating agent might be reasonable option.  #CKD stage III  [Dr. Latif] S/p Kidney Biopsy- FS[facility]. On steroids prednisone 50 mg/day [leg swelling] on taper- renal not improved ~GFR 22.  # bil LE swelling-sec to steroids/CKD- discussed re: mid-compression stocking; patient reluctantly agrees.  Leg elevation while resting.  # follow up in 3 months/labs- cbc/bmp/ldh/hapto  Cc; Dr.[name]

## 2018-01-15 NOTE — Progress Notes (Signed)
Pt c/o lower extremity edema.

## 2018-01-15 NOTE — Progress Notes (Signed)
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 [name]- kidney Bx- FSG [ on Prednisone Dr.[name]]  # hx of Lung nodules- resolved [[name]]/quit smoking.    No history exists.     INTERVAL HISTORY:  A  62 year old male patient with above history of primary myelofibrosis diagnosed in 2010 is here for follow-up.   Patient was recently admitted to hospital for shortness of breath/CHF-patient currently discharged home.  Patient is currently on 50 mg of prednisone for his renal dysfunction.   Patient complains of worsening swelling in the legs bilateral lower extremity. He admits to increased appetite.  Weight gain.  Difficulty sleeping at night.  Also complains of jitteriness.  REVIEW OF SYSTEMS:  A complete 10 point review of system is done which is negative except mentioned above/history of present illness.   PAST MEDICAL HISTORY :  Past Medical History:  Diagnosis Date  . Anemia   . Benign hypertensive renal disease   . CAD (coronary artery disease) 08/1996   Stent placed  . Chronic kidney disease, stage II (mild)   . Depression   . Dyspnea    with exertion  . Hyperlipidemia 10/2002  . Hypertension   . Myelofibrosis ([place]) 2010  . Smoker   . Thrombocytopenia ([employer])     PAST SURGICAL HISTORY :   Past Surgical History:  Procedure Laterality Date  . ANGIOPLASTY    . BONE MARROW ASPIRATION  03/2009  . CORONARY STENT PLACEMENT  08/1996    FAMILY  HISTORY :   Family History  Problem Relation Age of Onset  . Stroke Mother        Low BP stroke  . Depression Father   . Depression Sister        Breast  . Heart disease Unknown   . Breast cancer Unknown   . Lung cancer Unknown   . Ovarian cancer Unknown   . Stomach cancer Unknown     SOCIAL HISTORY:   Social History   Tobacco Use  . Smoking status: Current Every Day Smoker    Packs/day: 1.00    Types: Cigarettes  . Smokeless tobacco: Never Used  Substance Use Topics  . Alcohol use: No    Alcohol/week: 0.0 oz    Frequency: Never    Comment: patient states he has not consumed alcohol in the last month  . Drug use: No    ALLERGIES:  has No Known Allergies.  MEDICATIONS:  Current Outpatient Medications  Medication Sig Dispense Refill  . allopurinol (ZYLOPRIM) 100 MG tablet Take 0.5 tablets (50 mg total) by mouth daily. 45 tablet 3  . amLODipine (NORVASC) 10 MG tablet Take 1 tablet (10 mg total) by mouth daily. 30 tablet 3  . aspirin EC 81 MG EC tablet Take 1 tablet (81 mg total) by mouth daily. 30 tablet 1  . carvedilol (COREG) 3.125 MG tablet Take 1 tablet (3.125 mg total) by mouth 2 (two) times daily with a meal. 60 tablet 1  . citalopram (CELEXA) 20 MG tablet Take 1  tablet (20 mg total) by mouth daily. 90 tablet 1  . ferrous sulfate 325 (65 FE) MG tablet Take 1 tablet (325 mg total) by mouth daily with breakfast. 30 tablet 1  . nicotine (NICODERM CQ - DOSED IN MG/24 HOURS) 21 mg/24hr patch Place 1 patch (21 mg total) onto the skin daily. 28 patch 0  . predniSONE (DELTASONE) 10 MG tablet Take 50 mg by mouth daily.     [name] Kitchen atorvastatin (LIPITOR) 40 MG tablet Take 1 tablet (40 mg total) by mouth daily at 6 PM. (Patient not taking: Reported on 01/15/2018) 30 tablet 1  . losartan (COZAAR) 50 MG tablet Take 1 tablet by mouth daily.  12   No current facility-administered medications for this visit.     PHYSICAL EXAMINATION: ECOG PERFORMANCE STATUS: 0 - Asymptomatic  BP (!)  174/94   Pulse 72   Temp (!) 97.2 F (36.2 C) (Tympanic)   Resp 20   There were no vitals filed for this visit.  GENERAL: Well-nourished well-developed; Alert, no distress and comfortable.    Accompanied by his wife. EYES: no pallor or icterus OROPHARYNX: no thrush or ulceration; good dentition  NECK: supple, no masses felt LYMPH:  no palpable lymphadenopathy in the cervical, axillary or inguinal regions LUNGS: clear to auscultation and  No wheeze or crackles HEART/CVS: regular rate & rhythm and no murmurs; bilateral 2+ lower extremity edema;  ABDOMEN:abdomen soft, non-tender and normal bowel sounds Musculoskeletal:no cyanosis of digits and no clubbing  PSYCH: alert & oriented x 3 with fluent speech NEURO: no focal motor/sensory deficits SKIN:  no rashes or significant lesions  LABORATORY DATA:  I have reviewed the data as listed    Component Value Date/Time   NA 140 01/15/2018 1324   NA 141 01/07/2018 0836   K 4.5 01/15/2018 1324   CL 110 01/15/2018 1324   CO2 22 01/15/2018 1324   GLUCOSE 127 (H) 01/15/2018 1324   BUN 46 (H) 01/15/2018 1324   BUN 34 (H) 01/07/2018 0836   CREATININE 2.91 (H) 01/15/2018 1324   CREATININE 1.12 09/30/2013 0953   CALCIUM 8.0 (L) 01/15/2018 1324   PROT 5.6 (L) 12/14/2017 1004   PROT 6.6 09/25/2017 1045   ALBUMIN 3.2 (L) 12/14/2017 1004   ALBUMIN 4.6 09/25/2017 1045   AST 15 12/14/2017 1004   ALT 16 (L) 12/14/2017 1004   ALKPHOS 42 12/14/2017 1004   BILITOT 0.7 12/14/2017 1004   BILITOT 0.3 09/25/2017 1045   GFRNONAA 22 (L) 01/15/2018 1324   GFRNONAA >60 09/30/2013 0953   GFRAA 25 (L) 01/15/2018 1324   GFRAA >60 09/30/2013 0953    No results found for: SPEP, UPEP  Lab Results  Component Value Date   WBC 7.5 01/15/2018   NEUTROABS 7.0 (H) 01/15/2018   HGB 10.8 (L) 01/15/2018   HCT 32.0 (L) 01/15/2018   MCV 87.1 01/15/2018   PLT 184 01/15/2018      Chemistry      Component Value Date/Time   NA 140 01/15/2018 1324   NA 141  01/07/2018 0836   K 4.5 01/15/2018 1324   CL 110 01/15/2018 1324   CO2 22 01/15/2018 1324   BUN 46 (H) 01/15/2018 1324   BUN 34 (H) 01/07/2018 0836   CREATININE 2.91 (H) 01/15/2018 1324   CREATININE 1.12 09/30/2013 0953      Component Value Date/Time   CALCIUM 8.0 (L) 01/15/2018 1324   ALKPHOS 42 12/14/2017 1004   AST 15 12/14/2017 1004   ALT 16 (  L) 12/14/2017 1004   BILITOT 0.7 12/14/2017 1004   BILITOT 0.3 09/25/2017 1045     IMPRESSION: The spleen is mildly enlarged with a calculated volume of 584 cc. This is smaller than on the previous study at which time the volume was 980 cc.   Electronically Signed   By: David  Martinique M.D.   On: 11/04/2017 09:33  ASSESSMENT & PLAN:    Myelofibrosis ([place]) # 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  Currently on surveillance. Repeat BMBx [March 2019]-fibrosis; but no evidence of any blasts/or any features suggestive of transformation.   #Mild to moderate anemia hemoglobin around 10.5-; question CKD versus worsening myelofibrosis [less likely as LDH stable/spleen improving]; no significant evidence of iron deficiency.  If hemoglobin continues to worsen-erythropoietin stimulating agent might be reasonable option.  #CKD stage III  [Dr. Latif] S/p Kidney Biopsy- FSG. On steroids prednisone 50 mg/day [leg swelling] on taper- renal not improved ~GFR 22.  # bil LE swelling-sec to steroids/CKD- discussed re: mid-compression stocking; patient reluctantly agrees.  Leg elevation while resting.  # follow up in 3 months/labs- cbc/bmp/ldh/hapto  Cc; Dr.[name]       Cammie Sickle, MD 01/15/2018 2:14 PM

## 2018-01-18 DIAGNOSIS — N183 Chronic kidney disease, stage 3 (moderate): Secondary | ICD-10-CM | POA: Diagnosis not present

## 2018-01-22 ENCOUNTER — Ambulatory Visit (INDEPENDENT_AMBULATORY_CARE_PROVIDER_SITE_OTHER): Payer: Medicare HMO | Admitting: Family Medicine

## 2018-01-22 ENCOUNTER — Other Ambulatory Visit: Payer: Self-pay | Admitting: Family Medicine

## 2018-01-22 ENCOUNTER — Telehealth: Payer: Self-pay | Admitting: Family Medicine

## 2018-01-22 ENCOUNTER — Ambulatory Visit
Admission: RE | Admit: 2018-01-22 | Discharge: 2018-01-22 | Disposition: A | Discharge status: Home / Self Care | Payer: Medicare HMO | Source: Ambulatory Visit | Attending: Family Medicine | Admitting: Family Medicine

## 2018-01-22 ENCOUNTER — Encounter: Payer: Self-pay | Admitting: Family Medicine

## 2018-01-22 VITALS — BP 178/91 | HR 85 | Temp 97.6°F | Wt 189.1 lb

## 2018-01-22 DIAGNOSIS — M25512 Pain in left shoulder: Secondary | ICD-10-CM

## 2018-01-22 DIAGNOSIS — M19012 Primary osteoarthritis, left shoulder: Secondary | ICD-10-CM | POA: Diagnosis not present

## 2018-01-22 DIAGNOSIS — M75102 Unspecified rotator cuff tear or rupture of left shoulder, not specified as traumatic: Secondary | ICD-10-CM | POA: Diagnosis not present

## 2018-01-22 DIAGNOSIS — R0689 Other abnormalities of breathing: Secondary | ICD-10-CM

## 2018-01-22 DIAGNOSIS — R918 Other nonspecific abnormal finding of lung field: Secondary | ICD-10-CM | POA: Diagnosis not present

## 2018-01-22 DIAGNOSIS — R609 Edema, unspecified: Secondary | ICD-10-CM

## 2018-01-22 DIAGNOSIS — N051 Unspecified nephritic syndrome with focal and segmental glomerular lesions: Secondary | ICD-10-CM | POA: Diagnosis not present

## 2018-01-22 DIAGNOSIS — I129 Hypertensive chronic kidney disease with stage 1 through stage 4 chronic kidney disease, or unspecified chronic kidney disease: Secondary | ICD-10-CM | POA: Diagnosis not present

## 2018-01-22 MED ORDER — CARVEDILOL 6.25 MG PO TABS: 6.2500 mg | ORAL_TABLET | Freq: Two times a day (BID) | ORAL | 1 refills | Status: DC

## 2018-01-22 MED ORDER — CYCLOBENZAPRINE HCL 10 MG PO TABS
10.0000 mg | ORAL_TABLET | Freq: Every day | ORAL | 0 refills | Status: DC
Start: 2018-01-22 — End: 2018-04-16

## 2018-01-22 MED ORDER — TRAMADOL HCL 50 MG PO TABS: 50.0000 mg | ORAL_TABLET | Freq: Three times a day (TID) | ORAL | 0 refills | Status: DC | PRN

## 2018-01-22 NOTE — Assessment & Plan Note (Signed)
Not under good control. Will increase his carvedilol to 6.25mg  BID and continue losartan and amlodipine.

## 2018-01-22 NOTE — Telephone Encounter (Signed)
Called and spoke to his wife- x-ray looks like he tore his rotator cuff. Will get him into see ortho. Referral generated today.

## 2018-01-22 NOTE — Progress Notes (Signed)
BP (!) 178/91 (BP Location: Left Arm, Patient Position: Sitting, Cuff Size: Normal)   Pulse 85   Temp 97.6 F (36.4 C)   Wt 189 lb 2 oz (85.8 kg)   SpO2 99%   BMI 29.62 kg/m    Subjective:    [name], male    DOB: Jan 12, 1956, 62 y.o.   MRN: [id number]  HPI: James Arnold is a 62 y.o. male  Chief Complaint  Patient presents with  . Hypertension   HYPERTENSION- started on losartan 50 at the kidney doctor on 4/22 and continued on his amlodipine- following up with them next month Hypertension status: uncontrolled  Satisfied with current treatment? no Duration of hypertension: chronic BP monitoring frequency:  not checking BP medication side effects:  no Medication compliance: excellent compliance Previous BP meds: carvedilol, amlodipine, losartan Aspirin: yes Recurrent headaches: no Visual changes: no Palpitations: no Dyspnea: no Chest pain: no Lower extremity edema: yes Dizzy/lightheaded: no  Continues with a lot of swelling in his legs. He notes that he is feeling really tired. He is SOB with exertion only. Has been sleeping more- cat naps. He notes that he wakes up at night every 2-3 hours. Has not been snoring. Has no witnessed apnea. No headaches in the AM.   SHOULDER PAIN Duration: Couple of days ago Involved shoulder: left Mechanism of injury: reaching for something and it popped Location: diffuse Onset:sudden Severity: severe  Quality:  sharp Frequency: constant Radiation: no Aggravating factors: lifting and movement  Alleviating factors: nothing  Status: stable Treatments attempted: rest  Relief with NSAIDs?:  No NSAIDs Taken Weakness: yes Numbness: no Decreased grip strength: yes Redness: no Swelling: no Bruising: no Fevers: no  Relevant past medical, surgical, family and social history reviewed and updated as indicated. Interim medical history since our last visit reviewed. Allergies and medications reviewed and  updated.  Review of Systems  Constitutional: Negative.   Respiratory: Negative.   Cardiovascular: Negative.   Musculoskeletal: Positive for arthralgias and myalgias. Negative for back pain, gait problem, joint swelling, neck pain and neck stiffness.  Skin: Negative.   Neurological: Negative.   Hematological: Negative.   Psychiatric/Behavioral: Negative.     Per HPI unless specifically indicated above     Objective:    BP (!) 178/91 (BP Location: Left Arm, Patient Position: Sitting, Cuff Size: Normal)   Pulse 85   Temp 97.6 F (36.4 C)   Wt 189 lb 2 oz (85.8 kg)   SpO2 99%   BMI 29.62 kg/m   Wt Readings from Last 3 Encounters:  01/22/18 189 lb 2 oz (85.8 kg)  01/07/18 182 lb 5 oz (82.7 kg)  12/29/17 190 lb 12.8 oz (86.5 kg)    Physical Exam  Constitutional: He is oriented to person, place, and time. He appears well-developed and well-nourished. No distress.  HENT:  Head: Normocephalic and atraumatic.  Right Ear: Hearing normal.  Left Ear: Hearing normal.  Nose: Nose normal.  Eyes: Conjunctivae and lids are normal. Right eye exhibits no discharge. Left eye exhibits no discharge. No scleral icterus.  Cardiovascular: Normal rate, regular rhythm, normal heart sounds and intact distal pulses. Exam reveals no gallop and no friction rub.  No murmur heard. Pulmonary/Chest: Effort normal. No stridor. No respiratory distress. He has no wheezes. He has no rales. He exhibits no tenderness.  Musculoskeletal: Normal range of motion. He exhibits edema (2+ bilaterally) and tenderness. He exhibits no deformity.  Neurological: He is alert and oriented to person,  place, and time.  Skin: Skin is warm, dry and intact. No rash noted. No erythema. No pallor.  Psychiatric: He has a normal mood and affect. His speech is normal and behavior is normal. Judgment and thought content normal. Cognition and memory are normal.  Nursing note and vitals reviewed.    Shoulder: left    Inspection:  no  swelling, ecchymosis, erythema or step off deformity.    Tenderness to Palpation:    Acromion: no    AC joint:no    Clavicle: yes    Bicipital groove: no    Scapular spine: yes    Coracoid process: no    Humeral head: yes    Supraspinatus tendon: yes     Range of Motion:     Abduction:Decreased    Adduction: Decreased    Flexion: Decreased    Extension: Decreased    Internal rotation: Decreased    External rotation: Decreased    Painful arc: yes     Muscle Strength:     Flexion: Decreased8    Extension: Decreased    Abduction: Decreased    Adduction: Decreased    External rotation: Decreased    Internal rotation: Decreased     Neuro: Sensation WNL. and Upper extremity reflexes WNL.     Special Tests:     Neer sign: Positive    Hawkins sign: Positive    Cross arm adduction: Positive    Yergason sign: Negative    O'brien sign: Negative     Speed sign: Positive   Results for orders placed or performed in visit on 01/15/18  Lactate dehydrogenase  Result Value Ref Range   LDH 556 (H) 98 - 192 U/L  Basic metabolic panel  Result Value Ref Range   Sodium 140 135 - 145 mmol/L   Potassium 4.5 3.5 - 5.1 mmol/L   Chloride 110 101 - 111 mmol/L   CO2 22 22 - 32 mmol/L   Glucose, Bld 127 (H) 65 - 99 mg/dL   BUN 46 (H) 6 - 20 mg/dL   Creatinine, Ser 2.91 (H) 0.61 - 1.24 mg/dL   Calcium 8.0 (L) 8.9 - 10.3 mg/dL   GFR calc non Af Amer 22 (L) >60 mL/min   GFR calc Af Amer 25 (L) >60 mL/min   Anion gap 8 5 - 15  CBC with Differential  Result Value Ref Range   WBC 7.5 3.8 - 10.6 K/uL   RBC 3.67 (L) 4.40 - 5.90 MIL/uL   Hemoglobin 10.8 (L) 13.0 - 18.0 g/dL   HCT 32.0 (L) 40.0 - 52.0 %   MCV 87.1 80.0 - 100.0 fL   MCH 29.5 26.0 - 34.0 pg   MCHC 33.9 32.0 - 36.0 g/dL   RDW 19.0 (H) 11.5 - 14.5 %   Platelets 184 150 - 440 K/uL   Neutrophils Relative % 93 %   Neutro Abs 7.0 (H) 1.4 - 6.5 K/uL   Lymphocytes Relative 5 %   Lymphs Abs 0.4 (L) 1.0 - 3.6 K/uL   Monocytes  Relative 2 %   Monocytes Absolute 0.1 (L) 0.2 - 1.0 K/uL   Eosinophils Relative 0 %   Eosinophils Absolute 0.0 0 - 0.7 K/uL   Basophils Relative 0 %   Basophils Absolute 0.0 0 - 0.1 K/uL      Assessment & Plan:   Problem List Items Addressed This Visit      Genitourinary   Benign hypertensive renal disease - Primary    Not under good  control. Will increase his carvedilol to 6.25mg  BID and continue losartan and amlodipine.      FSGS (focal segmental glomerulosclerosis)    Following with nephrology for this. Currently on prednisone- initially started on 60mg , reduced at his last visit to 50mg . Has been doing OK. Tolerating medicine well. Continue to monitor.        Other Visit Diagnoses    Acute pain of left shoulder       Will obtain x-ray. Treat with tramadol and flexeril. Call with any concerns, await results. Rx for sling given- very little ROM, may need to see ortho sooner.   Relevant Orders   DG Shoulder Left   Edema, unspecified type       ?Due to amlodipine- will check x-ray to look for CHF. Breath sounds decreased. Rx for compression stockings given today.    Relevant Orders   DG Chest 2 View   Decreased breath sounds       Will check CXR to look for ?CHF- await results.    Relevant Orders   DG Chest 2 View       Follow up plan: Return 3-4 weeks.

## 2018-01-22 NOTE — Assessment & Plan Note (Signed)
Following with nephrology for this. Currently on prednisone- initially started on 60mg , reduced at his last visit to 50mg . Has been doing OK. Tolerating medicine well. Continue to monitor.

## 2018-01-25 DIAGNOSIS — R809 Proteinuria, unspecified: Secondary | ICD-10-CM | POA: Diagnosis not present

## 2018-01-25 DIAGNOSIS — N183 Chronic kidney disease, stage 3 (moderate): Secondary | ICD-10-CM | POA: Diagnosis not present

## 2018-01-25 DIAGNOSIS — N041 Nephrotic syndrome with focal and segmental glomerular lesions: Secondary | ICD-10-CM | POA: Diagnosis not present

## 2018-01-28 ENCOUNTER — Encounter: Payer: Self-pay | Admitting: Nurse Practitioner

## 2018-01-28 ENCOUNTER — Ambulatory Visit (INDEPENDENT_AMBULATORY_CARE_PROVIDER_SITE_OTHER): Payer: Medicare HMO | Admitting: Nurse Practitioner

## 2018-01-28 VITALS — BP 146/82 | HR 76 | Ht 67.0 in | Wt 190.0 lb

## 2018-01-28 DIAGNOSIS — I2510 Atherosclerotic heart disease of native coronary artery without angina pectoris: Secondary | ICD-10-CM | POA: Diagnosis not present

## 2018-01-28 DIAGNOSIS — E785 Hyperlipidemia, unspecified: Secondary | ICD-10-CM | POA: Diagnosis not present

## 2018-01-28 DIAGNOSIS — I10 Essential (primary) hypertension: Secondary | ICD-10-CM | POA: Diagnosis not present

## 2018-01-28 DIAGNOSIS — N184 Chronic kidney disease, stage 4 (severe): Secondary | ICD-10-CM | POA: Diagnosis not present

## 2018-01-28 MED ORDER — CARVEDILOL 12.5 MG PO TABS: 12.5000 mg | ORAL_TABLET | Freq: Two times a day (BID) | ORAL | 3 refills | Status: DC

## 2018-01-28 NOTE — Patient Instructions (Signed)
Medication Instructions: - Your physician has recommended you make the following change in your medication:  1) INCREASE coreg (carvedilol) to 12.5 mg- take 1 tablet by mouth TWICE daily  Labwork: - none ordered  Procedures/Testing: - none ordered  Follow-Up: - Your physician recommends that you schedule a follow-up appointment in: 3 months with Dr. Saunders Revel.   Any Additional Special Instructions Will Be Listed Below (If Applicable).     If you need a refill on your cardiac medications before your next appointment, please call your pharmacy.

## 2018-01-28 NOTE — Progress Notes (Signed)
Office Visit    Patient Name: James Arnold Date of Encounter: 01/28/2018  Primary Care Provider:  Valerie Roys, DO Primary Cardiologist:  Nelva Bush, MD  Chief Complaint    62 year old male with a history of CAD, hypertension, hyperlipidemia, diastolic dysfunction, FSGS, stage IV chronic kidney disease, and tobacco abuse, who presents for follow-up after recent hospitalization.  Past Medical History    Past Medical History:  Diagnosis Date  . Anemia   . Benign hypertensive renal disease   . CAD (coronary artery disease)    a. 08/1996 s/p BMS to the mLAD ([facility]); b. 12/2017 MV: EF 46%, fixed apical defect w/ significant GI uptake artifact. No ischemia-> low risk.  . CKD (chronic kidney disease), stage IV ([place])   . Depression   . Diastolic dysfunction    a. 12/2017 Echo: EF 60-65%, no rwma, Gr1 DD, mild AI/MR. Nl RVSP.  [name] Kitchen Dyspnea    with exertion  . FSGS (focal segmental glomerulosclerosis)   . Hyperlipidemia 10/2002  . Hypertension   . Myelofibrosis ([place]) 2010  . Smoker   . Thrombocytopenia ([place])    Past Surgical History:  Procedure Laterality Date  . ANGIOPLASTY    . BONE MARROW ASPIRATION  03/2009  . CORONARY STENT PLACEMENT  08/1996    Allergies  No Known Allergies  History of Present Illness    62 year old male with above complex past medical history including CAD status post bare-metal stenting of the mid LAD in 1997 at [place].  Other history includes hypertension, hyperlipidemia, FSGS/stage IV chronic kidney disease, myelofibrosis, anemia, thrombocytopenia, and tobacco abuse.  He was recently hospitalized with chest pain and underwent echocardiogram which showed normal LV function and 1 diastolic dysfunction.  Stress testing was undertaken and was low risk/nonischemic.  He was subsequently discharged.  Since discharge, he has done well.  He occasionally notes a sharp/fleeting discomfort that moves across his chest, lasting a few seconds, and resolving  continuously.  He has not had any exertional chest pain or dyspnea.  He has had some lower extremity swelling and says this started once he was placed on prednisone by nephrology.  He denies palpitations, PND, orthopnea, dizziness, syncope, or early satiety.  Home Medications    Prior to Admission medications   Medication Sig Start Date End Date Taking? Authorizing Provider  allopurinol (ZYLOPRIM) 100 MG tablet Take 0.5 tablets (50 mg total) by mouth daily. 09/25/17  Yes Johnson, Megan P, DO  amLODipine (NORVASC) 10 MG tablet Take 1 tablet (10 mg total) by mouth daily. 01/08/18 02/07/18 Yes Johnson, Megan P, DO  aspirin EC 81 MG EC tablet Take 1 tablet (81 mg total) by mouth daily. 12/31/17 01/30/18 Yes Pyreddy, Reatha Harps, MD  atorvastatin (LIPITOR) 40 MG tablet Take 1 tablet (40 mg total) by mouth daily at 6 PM. 12/30/17 01/29/18 Yes Pyreddy, Reatha Harps, MD  carvedilol (COREG) 6.25 MG tablet Take 1 tablet (6.25 mg total) by mouth 2 (two) times daily with a meal. 01/22/18 02/21/18 Yes Johnson, Megan P, DO  citalopram (CELEXA) 20 MG tablet Take 1 tablet (20 mg total) by mouth daily. 08/27/17  Yes Johnson, Megan P, DO  cyclobenzaprine (FLEXERIL) 10 MG tablet Take 1 tablet (10 mg total) by mouth at bedtime. 01/22/18  Yes Johnson, Megan P, DO  ferrous sulfate 325 (65 FE) MG tablet Take 1 tablet (325 mg total) by mouth daily with breakfast. 12/30/17 01/29/18 Yes Pyreddy, Pavan, MD  losartan (COZAAR) 50 MG tablet Take 1 tablet by mouth daily.  01/11/18  Yes [provider]  nicotine (NICODERM CQ - DOSED IN MG/24 HOURS) 21 mg/24hr patch Place 1 patch (21 mg total) onto the skin daily. 12/31/17  Yes Pyreddy, Reatha Harps, MD  predniSONE (DELTASONE) 10 MG tablet Take 50 mg by mouth daily.  12/04/17  Yes [provider]  traMADol (ULTRAM) 50 MG tablet Take 1 tablet (50 mg total) by mouth every 8 (eight) hours as needed. 01/22/18  Yes Johnson, Megan P, DO    Review of Systems    Occasional fleeting/sharp chest pain.  Mild  lower extremity swelling which is dependent in nature.  He denies palpitations, PND, orthopnea, dizziness, syncope, or early satiety.  All other systems reviewed and are otherwise negative except as noted above.  Physical Exam    VS:  BP (!) 146/82 (BP Location: Right Arm, Patient Position: Sitting, Cuff Size: Normal)   Pulse 76   Ht 5\' 7"  (7.209 m)   Wt 190 lb (86.2 kg)   BMI 29.76 kg/m  , BMI Body mass index is 29.76 kg/m. GEN: Well nourished, well developed, in no acute distress.  HEENT: normal.  Neck: Supple, no JVD, carotid bruits, or masses. Cardiac: RRR, no murmurs, rubs, or gallops. No clubbing, cyanosis, trace to 1+ bimalleolar edema.  Radials/DP/PT 2+ and equal bilaterally.  Respiratory:  Respirations regular and unlabored, clear to auscultation bilaterally. GI: Soft, nontender, nondistended, BS + x 4. MS: no deformity or atrophy. Skin: warm and dry, no rash. Neuro:  Strength and sensation are intact. Psych: Normal affect.  Accessory Clinical Findings    ECG -regular sinus rhythm, 76, LVH, no acute ST or T changes.  Assessment & Plan    1.  Coronary artery disease: Status post remote PCI and bare-metal stenting of the LAD in 1987.  Recent hospitalization for chest pain with negative Myoview.  He has had rare episodes of atypical, sharp/fleeting chest pain since discharge.  No exertional symptoms or dyspnea.  Continue medical therapy including aspirin, statin, beta-blocker, and ARB.  2.  Essential hypertension: Blood pressure elevated today at 146/82.  I am increasing his carvedilol to 12.5 mg twice daily.  He otherwise remains on amlodipine and losartan.  3.  Diastolic dysfunction: Echo showed normal LV function with grade 1 diastolic dysfunction.  He does have mild ankle edema, which he he says started once he was placed on prednisone.  Is on that for FSGS and is being followed closely by nephrology.  We discussed the importance of sodium avoidance, weight monitoring, and  symptom reporting.  I am increasing carvedilol and attempt to improve blood pressure management.  4.  Stage IV chronic kidney disease/FSGS: Followed by nephrology and on prednisone currently.  5.  Tobacco abuse: Complete cessation advised.  6.  Hyperlipidemia: Continue statin therapy.  LDL was 171 in April.  7.  Disposition: Follow-up in 3 months or sooner if necessary.  He has another appointment scheduled with nephrology and primary care in the interim.  Murray Hodgkins, NP 01/28/2018, 8:31 AM

## 2018-02-04 DIAGNOSIS — N2581 Secondary hyperparathyroidism of renal origin: Secondary | ICD-10-CM | POA: Diagnosis not present

## 2018-02-04 DIAGNOSIS — N041 Nephrotic syndrome with focal and segmental glomerular lesions: Secondary | ICD-10-CM | POA: Diagnosis not present

## 2018-02-04 DIAGNOSIS — D631 Anemia in chronic kidney disease: Secondary | ICD-10-CM | POA: Diagnosis not present

## 2018-02-04 DIAGNOSIS — N184 Chronic kidney disease, stage 4 (severe): Secondary | ICD-10-CM | POA: Diagnosis not present

## 2018-02-04 DIAGNOSIS — I10 Essential (primary) hypertension: Secondary | ICD-10-CM | POA: Diagnosis not present

## 2018-02-12 ENCOUNTER — Other Ambulatory Visit: Payer: Self-pay | Admitting: Family Medicine

## 2018-02-19 ENCOUNTER — Ambulatory Visit (INDEPENDENT_AMBULATORY_CARE_PROVIDER_SITE_OTHER): Payer: Medicare HMO | Admitting: Family Medicine

## 2018-02-19 ENCOUNTER — Encounter: Payer: Self-pay | Admitting: Family Medicine

## 2018-02-19 VITALS — BP 138/81 | HR 74 | Temp 97.7°F | Wt 173.1 lb

## 2018-02-19 DIAGNOSIS — I129 Hypertensive chronic kidney disease with stage 1 through stage 4 chronic kidney disease, or unspecified chronic kidney disease: Secondary | ICD-10-CM | POA: Diagnosis not present

## 2018-02-19 MED ORDER — ATORVASTATIN CALCIUM 40 MG PO TABS: 40.0000 mg | ORAL_TABLET | Freq: Every day | ORAL | 1 refills | Status: DC

## 2018-02-19 MED ORDER — CITALOPRAM HYDROBROMIDE 20 MG PO TABS: 20.0000 mg | ORAL_TABLET | Freq: Every day | ORAL | 1 refills | Status: DC

## 2018-02-19 MED ORDER — LOSARTAN POTASSIUM 50 MG PO TABS
50.0000 mg | ORAL_TABLET | Freq: Every day | ORAL | 1 refills | Status: DC
Start: 2018-02-19 — End: 2018-07-09

## 2018-02-19 MED ORDER — AMLODIPINE BESYLATE 10 MG PO TABS: 10.0000 mg | ORAL_TABLET | Freq: Every day | ORAL | 1 refills | Status: DC

## 2018-02-19 NOTE — Progress Notes (Signed)
BP 138/81 (BP Location: Left Arm, Patient Position: Sitting, Cuff Size: Normal)   Pulse 74   Temp 97.7 F (36.5 C)   Wt 173 lb 2 oz (78.5 kg)   SpO2 100%   BMI 27.12 kg/m    Subjective:    [name], male    DOB: [birth year], 62 y.o.   MRN: [id number]  HPI: James Arnold is a 62 y.o. male  Chief Complaint  Patient presents with  . Hypertension   HYPERTENSION Hypertension status: better  Satisfied with current treatment? yes Duration of hypertension: chronic BP monitoring frequency:  not checking BP medication side effects:  no Medication compliance: excellent compliance Aspirin: no Recurrent headaches: no Visual changes: no Palpitations: no Dyspnea: no Chest pain: no Lower extremity edema: no Dizzy/lightheaded: no  Relevant past medical, surgical, family and social history reviewed and updated as indicated. Interim medical history since our last visit reviewed. Allergies and medications reviewed and updated.  Review of Systems  Constitutional: Positive for fatigue. Negative for activity change, appetite change, chills, diaphoresis, fever and unexpected weight change.  Respiratory: Negative.   Cardiovascular: Negative.   Neurological: Negative.   Psychiatric/Behavioral: Negative.     Per HPI unless specifically indicated above     Objective:    BP 138/81 (BP Location: Left Arm, Patient Position: Sitting, Cuff Size: Normal)   Pulse 74   Temp 97.7 F (36.5 C)   Wt 173 lb 2 oz (78.5 kg)   SpO2 100%   BMI 27.12 kg/m   Wt Readings from Last 3 Encounters:  02/19/18 173 lb 2 oz (78.5 kg)  01/28/18 190 lb (86.2 kg)  01/22/18 189 lb 2 oz (85.8 kg)    Physical Exam  Constitutional: He is oriented to person, place, and time. He appears well-developed and well-nourished. No distress.  HENT:  Head: Normocephalic and atraumatic.  Right Ear: Hearing normal.  Left Ear: Hearing normal.  Nose: Nose normal.  Eyes: Conjunctivae and lids are normal.  Right eye exhibits no discharge. Left eye exhibits no discharge. No scleral icterus.  Cardiovascular: Normal rate, regular rhythm, normal heart sounds and intact distal pulses. Exam reveals no gallop and no friction rub.  No murmur heard. Pulmonary/Chest: Effort normal and breath sounds normal. No stridor. No respiratory distress. He has no wheezes. He has no rales. He exhibits no tenderness.  Musculoskeletal: Normal range of motion.  Neurological: He is alert and oriented to person, place, and time.  Skin: Skin is warm, dry and intact. Capillary refill takes less than 2 seconds. No rash noted. He is not diaphoretic. No erythema. No pallor.  Psychiatric: He has a normal mood and affect. His speech is normal and behavior is normal. Judgment and thought content normal. Cognition and memory are normal.  Nursing note and vitals reviewed.   Results for orders placed or performed in visit on 01/15/18  Lactate dehydrogenase  Result Value Ref Range   LDH 556 (H) 98 - 192 U/L  Basic metabolic panel  Result Value Ref Range   Sodium 140 135 - 145 mmol/L   Potassium 4.5 3.5 - 5.1 mmol/L   Chloride 110 101 - 111 mmol/L   CO2 22 22 - 32 mmol/L   Glucose, Bld 127 (H) 65 - 99 mg/dL   BUN 46 (H) 6 - 20 mg/dL   Creatinine, Ser 2.91 (H) 0.61 - 1.24 mg/dL   Calcium 8.0 (L) 8.9 - 10.3 mg/dL   GFR calc non Af Amer 22 (L) >  60 mL/min   GFR calc Af Amer 25 (L) >60 mL/min   Anion gap 8 5 - 15  CBC with Differential  Result Value Ref Range   WBC 7.5 3.8 - 10.6 K/uL   RBC 3.67 (L) 4.40 - 5.90 MIL/uL   Hemoglobin 10.8 (L) 13.0 - 18.0 g/dL   HCT 32.0 (L) 40.0 - 52.0 %   MCV 87.1 80.0 - 100.0 fL   MCH 29.5 26.0 - 34.0 pg   MCHC 33.9 32.0 - 36.0 g/dL   RDW 19.0 (H) 11.5 - 14.5 %   Platelets 184 150 - 440 K/uL   Neutrophils Relative % 93 %   Neutro Abs 7.0 (H) 1.4 - 6.5 K/uL   Lymphocytes Relative 5 %   Lymphs Abs 0.4 (L) 1.0 - 3.6 K/uL   Monocytes Relative 2 %   Monocytes Absolute 0.1 (L) 0.2 - 1.0 K/uL     Eosinophils Relative 0 %   Eosinophils Absolute 0.0 0 - 0.7 K/uL   Basophils Relative 0 %   Basophils Absolute 0.0 0 - 0.1 K/uL      Assessment & Plan:   Problem List Items Addressed This Visit      Genitourinary   Benign hypertensive renal disease - Primary    Under good control. Continue to monitor. Refills given. Call with any concerns.       Relevant Orders   Basic metabolic panel       Follow up plan: Return in about 6 months (around 08/21/2018) for Physical and wellness.

## 2018-02-19 NOTE — Assessment & Plan Note (Signed)
Under good control. Continue to monitor. Refills given. Call with any concerns.

## 2018-02-20 LAB — BASIC METABOLIC PANEL
BUN/Creatinine Ratio: 11 (ref 10–24)
BUN: 36 mg/dL — AB (ref 8–27)
CALCIUM: 8.4 mg/dL — AB (ref 8.6–10.2)
CO2: 18 mmol/L — ABNORMAL LOW (ref 20–29)
Chloride: 105 mmol/L (ref 96–106)
Creatinine, Ser: 3.38 mg/dL — ABNORMAL HIGH (ref 0.76–1.27)
GFR calc non Af Amer: 19 mL/min/{1.73_m2} — ABNORMAL LOW (ref >59)
GFR, EST AFRICAN AMERICAN: 21 mL/min/{1.73_m2} — AB (ref >59)
Glucose: 99 mg/dL (ref 65–99)
Potassium: 4.8 mmol/L (ref 3.5–5.2)
Sodium: 139 mmol/L (ref 134–144)

## 2018-02-22 DIAGNOSIS — N2581 Secondary hyperparathyroidism of renal origin: Secondary | ICD-10-CM | POA: Diagnosis not present

## 2018-02-22 DIAGNOSIS — N041 Nephrotic syndrome with focal and segmental glomerular lesions: Secondary | ICD-10-CM | POA: Diagnosis not present

## 2018-02-22 DIAGNOSIS — D631 Anemia in chronic kidney disease: Secondary | ICD-10-CM | POA: Diagnosis not present

## 2018-02-22 DIAGNOSIS — I10 Essential (primary) hypertension: Secondary | ICD-10-CM | POA: Diagnosis not present

## 2018-02-22 DIAGNOSIS — N183 Chronic kidney disease, stage 3 (moderate): Secondary | ICD-10-CM | POA: Diagnosis not present

## 2018-02-24 ENCOUNTER — Ambulatory Visit: Payer: Medicare HMO

## 2018-03-23 DIAGNOSIS — N184 Chronic kidney disease, stage 4 (severe): Secondary | ICD-10-CM | POA: Diagnosis not present

## 2018-03-23 DIAGNOSIS — I10 Essential (primary) hypertension: Secondary | ICD-10-CM | POA: Diagnosis not present

## 2018-03-23 DIAGNOSIS — N2581 Secondary hyperparathyroidism of renal origin: Secondary | ICD-10-CM | POA: Diagnosis not present

## 2018-03-23 DIAGNOSIS — N041 Nephrotic syndrome with focal and segmental glomerular lesions: Secondary | ICD-10-CM | POA: Diagnosis not present

## 2018-03-23 DIAGNOSIS — D631 Anemia in chronic kidney disease: Secondary | ICD-10-CM | POA: Diagnosis not present

## 2018-03-29 ENCOUNTER — Ambulatory Visit: Payer: Medicare HMO | Admitting: Family Medicine

## 2018-04-12 DIAGNOSIS — R809 Proteinuria, unspecified: Secondary | ICD-10-CM | POA: Diagnosis not present

## 2018-04-12 DIAGNOSIS — N183 Chronic kidney disease, stage 3 (moderate): Secondary | ICD-10-CM | POA: Diagnosis not present

## 2018-04-12 DIAGNOSIS — N041 Nephrotic syndrome with focal and segmental glomerular lesions: Secondary | ICD-10-CM | POA: Diagnosis not present

## 2018-04-16 ENCOUNTER — Inpatient Hospital Stay (HOSPITAL_BASED_OUTPATIENT_CLINIC_OR_DEPARTMENT_OTHER): Payer: Medicare HMO | Admitting: Internal Medicine

## 2018-04-16 ENCOUNTER — Inpatient Hospital Stay: Payer: Medicare HMO | Attending: Internal Medicine

## 2018-04-16 VITALS — BP 124/67 | HR 78 | Temp 97.4°F | Resp 16 | Wt 171.6 lb

## 2018-04-16 DIAGNOSIS — R0602 Shortness of breath: Secondary | ICD-10-CM | POA: Insufficient documentation

## 2018-04-16 DIAGNOSIS — D696 Thrombocytopenia, unspecified: Secondary | ICD-10-CM

## 2018-04-16 DIAGNOSIS — Z8041 Family history of malignant neoplasm of ovary: Secondary | ICD-10-CM

## 2018-04-16 DIAGNOSIS — N184 Chronic kidney disease, stage 4 (severe): Secondary | ICD-10-CM

## 2018-04-16 DIAGNOSIS — Z79899 Other long term (current) drug therapy: Secondary | ICD-10-CM | POA: Insufficient documentation

## 2018-04-16 DIAGNOSIS — F329 Major depressive disorder, single episode, unspecified: Secondary | ICD-10-CM | POA: Insufficient documentation

## 2018-04-16 DIAGNOSIS — Z800 Family history of malignant neoplasm of digestive organs: Secondary | ICD-10-CM | POA: Insufficient documentation

## 2018-04-16 DIAGNOSIS — E785 Hyperlipidemia, unspecified: Secondary | ICD-10-CM | POA: Diagnosis not present

## 2018-04-16 DIAGNOSIS — M7989 Other specified soft tissue disorders: Secondary | ICD-10-CM | POA: Diagnosis not present

## 2018-04-16 DIAGNOSIS — R5383 Other fatigue: Secondary | ICD-10-CM

## 2018-04-16 DIAGNOSIS — Z87891 Personal history of nicotine dependence: Secondary | ICD-10-CM | POA: Diagnosis not present

## 2018-04-16 DIAGNOSIS — D471 Chronic myeloproliferative disease: Secondary | ICD-10-CM | POA: Insufficient documentation

## 2018-04-16 DIAGNOSIS — D7581 Myelofibrosis: Secondary | ICD-10-CM

## 2018-04-16 DIAGNOSIS — I129 Hypertensive chronic kidney disease with stage 1 through stage 4 chronic kidney disease, or unspecified chronic kidney disease: Secondary | ICD-10-CM | POA: Diagnosis not present

## 2018-04-16 DIAGNOSIS — R531 Weakness: Secondary | ICD-10-CM

## 2018-04-16 DIAGNOSIS — Z801 Family history of malignant neoplasm of trachea, bronchus and lung: Secondary | ICD-10-CM | POA: Insufficient documentation

## 2018-04-16 LAB — CBC WITH DIFFERENTIAL/PLATELET
BASOS ABS: 0 10*3/uL (ref 0–0.1)
BASOS PCT: 0 %
EOS PCT: 0 %
Eosinophils Absolute: 0 10*3/uL (ref 0–0.7)
HCT: 25.8 % — ABNORMAL LOW (ref 40.0–52.0)
Hemoglobin: 8.6 g/dL — ABNORMAL LOW (ref 13.0–18.0)
Lymphocytes Relative: 11 %
Lymphs Abs: 0.7 10*3/uL — ABNORMAL LOW (ref 1.0–3.6)
MCH: 28.4 pg (ref 26.0–34.0)
MCHC: 33.5 g/dL (ref 32.0–36.0)
MCV: 84.7 fL (ref 80.0–100.0)
MONO ABS: 0.2 10*3/uL (ref 0.2–1.0)
Monocytes Relative: 4 %
NEUTROS ABS: 5.2 10*3/uL (ref 1.4–6.5)
Neutrophils Relative %: 85 %
PLATELETS: 197 10*3/uL (ref 150–440)
RBC: 3.05 MIL/uL — AB (ref 4.40–5.90)
RDW: 17.7 % — AB (ref 11.5–14.5)
WBC: 6.2 10*3/uL (ref 3.8–10.6)

## 2018-04-16 NOTE — Progress Notes (Signed)
[facility] OFFICE PROGRESS NOTE  Patient Care Team: Valerie Roys, DO as PCP - General (Family Medicine) End, Harrell Gave, MD as PCP - Cardiology (Cardiology) Cammie Sickle, MD as Consulting Physician (Internal Medicine)  Cancer Staging No matching staging information was found for the patient.   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 [name]- kidney Bx- FSG [ on Prednisone Dr.[name]];   # AUGUST 1st week 2019- START ARANESP 300 mcg q 4W  # hx of Lung nodules- resolved [[name]]/quit smoking.  DIAGNOSIS: PRIMARY MYELOFIBROSIS  RISK:LOW     ;GOALS: Control  CURRENT/MOST RECENT THERAPY : surveillance      Primary myelofibrosis ([place])      INTERVAL HISTORY:  James Arnold 62 y.o.  male pleasant patient above history of primary myelofibrosis Jak 2+ currently on surveillance; and also chronic kidney disease/FSG on prednisone is here for follow-up.  Patient is currently on prednisone 10 mg a day for the last 1 month.  Patient noted to have significant swelling of his legs on a higher dose of prednisone.  Is improving.  Not resolved.  Patient complains of worsening fatigue.  Patient continues to take p.o. Iron.  Denies any nausea vomiting or constipation.   Review of Systems  Constitutional: Positive for malaise/fatigue. Negative for chills, diaphoresis, fever and weight loss.  HENT: Negative for nosebleeds and sore throat.   Eyes: Negative for double vision.  Respiratory: Positive for shortness of breath. Negative for cough, hemoptysis, sputum production and wheezing.   Cardiovascular: Positive for leg swelling. Negative  for chest pain, palpitations and orthopnea.  Gastrointestinal: Negative for abdominal pain, blood in stool, constipation, diarrhea, heartburn, melena, nausea and vomiting.  Genitourinary: Negative for dysuria, frequency and urgency.  Musculoskeletal: Negative for back pain and joint pain.  Skin: Negative.  Negative for itching and rash.  Neurological: Negative for dizziness, tingling, focal weakness, weakness and headaches.  Endo/Heme/Allergies: Does not bruise/bleed easily.  Psychiatric/Behavioral: Negative for depression. The patient is not nervous/anxious and does not have insomnia.       PAST MEDICAL HISTORY :  Past Medical History:  Diagnosis Date  . Anemia   . Benign hypertensive renal disease   . CAD (coronary artery disease)    a. 08/1996 s/p BMS to the mLAD ([facility]); b. 12/2017 MV: EF 46%, fixed apical defect w/ significant GI uptake artifact. No ischemia-> low risk.  . CKD (chronic kidney disease), stage IV ([place])   . Depression   . Diastolic dysfunction    a. 12/2017 Echo: EF 60-65%, no rwma, Gr1 DD, mild AI/MR. Nl RVSP.  [name] Kitchen Dyspnea    with exertion  . FSGS (focal segmental glomerulosclerosis)   . Hyperlipidemia 10/2002  . Hypertension   . Myelofibrosis ([place]) 2010  . Smoker   . Thrombocytopenia ([place])     PAST SURGICAL HISTORY :   Past Surgical History:  Procedure Laterality Date  . ANGIOPLASTY    . BONE MARROW ASPIRATION  03/2009  . CORONARY STENT PLACEMENT  08/1996    FAMILY HISTORY :   Family History  Problem Relation Age of Onset  . Stroke Mother        Low BP stroke  . Depression Father   .  Depression Sister        Breast  . Heart disease Unknown   . Breast cancer Unknown   . Lung cancer Unknown   . Ovarian cancer Unknown   . Stomach cancer Unknown     SOCIAL HISTORY:   Social History   Tobacco Use  . Smoking status: Former Smoker    Packs/day: 1.00    Types: Cigarettes    Last attempt to quit: 01/11/2018    Years since quitting: 0.2  . Smokeless  tobacco: Never Used  Substance Use Topics  . Alcohol use: No    Alcohol/week: 0.0 oz    Frequency: Never    Comment: patient states he has not consumed alcohol in the last month  . Drug use: No    ALLERGIES:  has No Known Allergies.  MEDICATIONS:  Current Outpatient Medications  Medication Sig Dispense Refill  . allopurinol (ZYLOPRIM) 100 MG tablet Take 0.5 tablets (50 mg total) by mouth daily. 45 tablet 3  . carvedilol (COREG) 12.5 MG tablet Take 1 tablet (12.5 mg total) by mouth 2 (two) times daily. 180 tablet 3  . citalopram (CELEXA) 20 MG tablet Take 1 tablet (20 mg total) by mouth daily. 90 tablet 1  . furosemide (LASIX) 40 MG tablet TK 1 T PO QD  12  . losartan (COZAAR) 50 MG tablet Take 1 tablet (50 mg total) by mouth daily. 90 tablet 1  . traMADol (ULTRAM) 50 MG tablet Take 1 tablet (50 mg total) by mouth every 8 (eight) hours as needed. 21 tablet 0  . amLODipine (NORVASC) 10 MG tablet Take 1 tablet (10 mg total) by mouth daily. 90 tablet 1  . ferrous sulfate 325 (65 FE) MG tablet Take 1 tablet (325 mg total) by mouth daily with breakfast. 30 tablet 1   No current facility-administered medications for this visit.     PHYSICAL EXAMINATION: ECOG PERFORMANCE STATUS: 1 - Symptomatic but completely ambulatory  BP 124/67 (BP Location: Left Arm, Patient Position: Sitting)   Pulse 78   Temp (!) 97.4 F (36.3 C) (Tympanic)   Resp 16   Wt 171 lb 9.6 oz (77.8 kg)   BMI 26.88 kg/m   Filed Weights   04/16/18 1359  Weight: 171 lb 9.6 oz (77.8 kg)    GENERAL: Well-nourished well-developed; Alert, no distress and comfortable.  accompanied by family.  EYES: Positive for pallor. OROPHARYNX: no thrush or ulceration; NECK: supple; no lymph nodes felt. LYMPH:  no palpable lymphadenopathy in the axillary or inguinal regions LUNGS: Decreased breath sounds auscultation bilaterally. No wheeze or crackles HEART/CVS: regular rate & rhythm and no murmurs; 1+ bilateral lower extremity  edema ABDOMEN:abdomen soft, non-tender and normal bowel sounds. No hepatomegaly or splenomegaly.  Musculoskeletal:no cyanosis of digits and no clubbing  PSYCH: alert & oriented x 3 with fluent speech NEURO: no focal motor/sensory deficits SKIN:  no rashes or significant lesions    LABORATORY DATA:  I have reviewed the data as listed    Component Value Date/Time   NA 139 02/19/2018 0932   K 4.8 02/19/2018 0932   CL 105 02/19/2018 0932   CO2 18 (L) 02/19/2018 0932   GLUCOSE 99 02/19/2018 0932   GLUCOSE 127 (H) 01/15/2018 1324   BUN 36 (H) 02/19/2018 0932   CREATININE 3.38 (H) 02/19/2018 0932   CREATININE 1.12 09/30/2013 0953   CALCIUM 8.4 (L) 02/19/2018 0932   PROT 5.6 (L) 12/14/2017 1004   PROT 6.6 09/25/2017 1045   ALBUMIN 3.2 (L)  12/14/2017 1004   ALBUMIN 4.6 09/25/2017 1045   AST 15 12/14/2017 1004   ALT 16 (L) 12/14/2017 1004   ALKPHOS 42 12/14/2017 1004   BILITOT 0.7 12/14/2017 1004   BILITOT 0.3 09/25/2017 1045   GFRNONAA 19 (L) 02/19/2018 0932   GFRNONAA >60 09/30/2013 0953   GFRAA 21 (L) 02/19/2018 0932   GFRAA >60 09/30/2013 0953    No results found for: SPEP, UPEP  Lab Results  Component Value Date   WBC 6.2 04/16/2018   NEUTROABS 5.2 04/16/2018   HGB 8.6 (L) 04/16/2018   HCT 25.8 (L) 04/16/2018   MCV 84.7 04/16/2018   PLT 197 04/16/2018      Chemistry      Component Value Date/Time   NA 139 02/19/2018 0932   K 4.8 02/19/2018 0932   CL 105 02/19/2018 0932   CO2 18 (L) 02/19/2018 0932   BUN 36 (H) 02/19/2018 0932   CREATININE 3.38 (H) 02/19/2018 0932   CREATININE 1.12 09/30/2013 0953      Component Value Date/Time   CALCIUM 8.4 (L) 02/19/2018 0932   ALKPHOS 42 12/14/2017 1004   AST 15 12/14/2017 1004   ALT 16 (L) 12/14/2017 1004   BILITOT 0.7 12/14/2017 1004   BILITOT 0.3 09/25/2017 1045       RADIOGRAPHIC STUDIES: I have personally reviewed the radiological images as listed and agreed with the findings in the report. No results found.    ASSESSMENT & PLAN:  Primary myelofibrosis ([place]) # 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  Currently on surveillance. Repeat BMBx [March 2019]-fibrosis; but no evidence of any blasts/or any features suggestive of transformation.   #Mild to moderate anemia hemoglobin around 8.5; likley CKD versus worsening myelofibrosis [less likely as LDH stable/spleen improving]; recommend Aranesp 300 mcg q 4 Weeks.  Discussed the potential adverse events with Aranesp-especially with hemoglobin target greater than 12-thromboembolic events/increased risk of MI.  Discussed that our goal /target hemoglobin would be around 10.  Iron studies February 2019-negative for any deficiency.  #CKD stage III-IV [Dr. Latif] S/p Kidney Biopsy- FSG. On steroids prednisone 10 mg mg/day; stable.  Not on dialysis.  # follow up in 1 month/ cbc-ferririn/ironstudies- aranesp; in 2 months cbc/bmp/MD/ aranesp; 1 week aranesp- NO labs.   Cc; Dr.[name]    Orders Placed This Encounter  Procedures  . CBC with Differential/Platelet    Standing Status:   Standing    Number of Occurrences:   10    Standing Expiration Date:   04/17/2019  . Comprehensive metabolic panel    Standing Status:   Standing    Number of Occurrences:   10    Standing Expiration Date:   04/17/2019  . Iron and TIBC    Standing Status:   Standing    Number of Occurrences:   10    Standing Expiration Date:   04/17/2019  . Ferritin    Standing Status:   Standing    Number of Occurrences:   10    Standing Expiration Date:   04/17/2019   All questions were answered. The patient knows to call the clinic with any problems, questions or concerns.      Cammie Sickle, MD 04/16/2018 3:03 PM

## 2018-04-16 NOTE — Assessment & Plan Note (Addendum)
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  Currently on surveillance. Repeat BMBx [March 2019]-fibrosis; but no evidence of any blasts/or any features suggestive of transformation.   #Mild to moderate anemia hemoglobin around 8.5; likley CKD versus worsening myelofibrosis [less likely as LDH stable/spleen improving]; recommend Aranesp 300 mcg q 4 Weeks.  Discussed the potential adverse events with Aranesp-especially with hemoglobin target greater than 12-thromboembolic events/increased risk of MI.  Discussed that our goal /target hemoglobin would be around 10.  Iron studies February 2019-negative for any deficiency.  #CKD stage III-IV [Dr. Latif] S/p Kidney Biopsy- FSG. On steroids prednisone 10 mg mg/day; stable.  Not on dialysis.  # follow up in 1 month/ cbc-ferririn/ironstudies- aranesp; in 2 months cbc/bmp/MD/ aranesp; 1 week aranesp- NO labs.   Cc; Dr.[name]

## 2018-04-20 ENCOUNTER — Telehealth: Payer: Self-pay | Admitting: Internal Medicine

## 2018-04-20 ENCOUNTER — Other Ambulatory Visit: Payer: Self-pay | Admitting: Internal Medicine

## 2018-04-20 NOTE — Progress Notes (Signed)
x

## 2018-04-20 NOTE — Telephone Encounter (Signed)
Matt/Jennifer- I need help with retacrit orders- I cannot order them right. I would appreciate your help [pt due on aug 2nd]  # Retacrit 10,000 units SQ every 2 weeks.   Thank you as always!  GB

## 2018-04-21 ENCOUNTER — Other Ambulatory Visit: Payer: Self-pay | Admitting: Internal Medicine

## 2018-04-23 ENCOUNTER — Inpatient Hospital Stay: Payer: Medicare HMO | Attending: Internal Medicine

## 2018-04-23 VITALS — BP 117/64 | HR 76

## 2018-04-23 DIAGNOSIS — N183 Chronic kidney disease, stage 3 (moderate): Secondary | ICD-10-CM | POA: Diagnosis not present

## 2018-04-23 DIAGNOSIS — Z79899 Other long term (current) drug therapy: Secondary | ICD-10-CM | POA: Diagnosis not present

## 2018-04-23 DIAGNOSIS — D631 Anemia in chronic kidney disease: Secondary | ICD-10-CM | POA: Insufficient documentation

## 2018-04-23 DIAGNOSIS — I129 Hypertensive chronic kidney disease with stage 1 through stage 4 chronic kidney disease, or unspecified chronic kidney disease: Secondary | ICD-10-CM | POA: Insufficient documentation

## 2018-04-23 MED ORDER — EPOETIN ALFA-EPBX 10000 UNIT/ML IJ SOLN
10000.0000 [IU] | INTRAMUSCULAR | Status: DC
  Administered 2018-04-23: 10000 [IU] via SUBCUTANEOUS
  Filled 2018-04-23: qty 1

## 2018-04-26 DIAGNOSIS — N2581 Secondary hyperparathyroidism of renal origin: Secondary | ICD-10-CM | POA: Diagnosis not present

## 2018-04-26 DIAGNOSIS — N041 Nephrotic syndrome with focal and segmental glomerular lesions: Secondary | ICD-10-CM | POA: Diagnosis not present

## 2018-04-26 DIAGNOSIS — I10 Essential (primary) hypertension: Secondary | ICD-10-CM | POA: Diagnosis not present

## 2018-04-26 DIAGNOSIS — N184 Chronic kidney disease, stage 4 (severe): Secondary | ICD-10-CM | POA: Diagnosis not present

## 2018-04-26 DIAGNOSIS — D631 Anemia in chronic kidney disease: Secondary | ICD-10-CM | POA: Diagnosis not present

## 2018-05-05 ENCOUNTER — Ambulatory Visit: Payer: Medicare HMO | Admitting: Internal Medicine

## 2018-05-10 ENCOUNTER — Ambulatory Visit: Payer: Self-pay | Admitting: Family Medicine

## 2018-05-10 ENCOUNTER — Ambulatory Visit (INDEPENDENT_AMBULATORY_CARE_PROVIDER_SITE_OTHER): Payer: Medicare HMO | Admitting: Physician Assistant

## 2018-05-10 ENCOUNTER — Ambulatory Visit: Payer: Self-pay | Admitting: *Deleted

## 2018-05-10 ENCOUNTER — Encounter: Payer: Self-pay | Admitting: Physician Assistant

## 2018-05-10 ENCOUNTER — Other Ambulatory Visit: Payer: Self-pay

## 2018-05-10 VITALS — Wt 175.0 lb

## 2018-05-10 DIAGNOSIS — N183 Chronic kidney disease, stage 3 unspecified: Secondary | ICD-10-CM

## 2018-05-10 DIAGNOSIS — R42 Dizziness and giddiness: Secondary | ICD-10-CM

## 2018-05-10 DIAGNOSIS — D7581 Myelofibrosis: Secondary | ICD-10-CM | POA: Diagnosis not present

## 2018-05-10 DIAGNOSIS — I129 Hypertensive chronic kidney disease with stage 1 through stage 4 chronic kidney disease, or unspecified chronic kidney disease: Secondary | ICD-10-CM

## 2018-05-10 MED ORDER — FUROSEMIDE 20 MG PO TABS
20.0000 mg | ORAL_TABLET | Freq: Every day | ORAL | 0 refills | Status: DC
Start: 2018-05-10 — End: 2018-07-13

## 2018-05-10 NOTE — Telephone Encounter (Signed)
Pts wife calling; hung up before triaged. Attempt CB.

## 2018-05-10 NOTE — Patient Instructions (Signed)
Please take 20 mg of Lasix and follow up in one week in this clinic.

## 2018-05-10 NOTE — Telephone Encounter (Signed)
Patient is having dizziness and is feeling off balance today- unsteady on feet. Patient is under treatment at cancer center and he is having kidney failure. Patient's wife is calling to states that her husband is dizzy today- he is staying hydrated and reports no swelling. He has not had this symptom before. Appointment for assessment of new onset dizziness.  Reason for Disposition . [1] MODERATE dizziness (e.g., interferes with normal activities) AND [2] has NOT been evaluated by physician for this  (Exception: dizziness caused by heat exposure, sudden standing, or poor fluid intake)  Answer Assessment - Initial Assessment Questions 1. DESCRIPTION: "Describe your dizziness."     Head is swimmy- feels drunk like everything is swaying back and forth  2. LIGHTHEADED: "Do you feel lightheaded?" (e.g., somewhat faint, woozy, weak upon standing)     yes 3. VERTIGO: "Do you feel like either you or the room is spinning or tilting?" (i.e. vertigo)    When he stands- everything spins 4. SEVERITY: "How bad is it?"  "Do you feel like you are going to faint?" "Can you stand and walk?"   - MILD - walking normally   - MODERATE - interferes with normal activities (e.g., work, school)    - SEVERE - unable to stand, requires support to walk, feels like passing out now.     Mild/ moderate 5. ONSET:  "When did the dizziness begin?"     today 6. AGGRAVATING FACTORS: "Does anything make it worse?" (e.g., standing, change in head position)     standing 7. HEART RATE: "Can you tell me your heart rate?" "How many beats in 15 seconds?"  (Note: not all patients can do this)       Maybe- numbness in left arm- off/on for a while- comes and goes 8. CAUSE: "What do you think is causing the dizziness?"     No idea 9. RECURRENT SYMPTOM: "Have you had dizziness before?" If so, ask: "When was the last time?" "What happened that time?"     It's been a long time- few months ago- didn't last long- passed quickly 10. OTHER  SYMPTOMS: "Do you have any other symptoms?" (e.g., fever, chest pain, vomiting, diarrhea, bleeding)       Some SOB- when active 11. PREGNANCY: "Is there any chance you are pregnant?" "When was your last menstrual period?"       n/a  Protocols used: DIZZINESS [facility]

## 2018-05-10 NOTE — Progress Notes (Signed)
Subjective:    [name], male    DOB: 1955/11/09, 62 y.o.   MRN: [id number]  James Arnold is a 62 y.o. male presenting on 05/10/2018 for Dizziness (Started today, woke with it. Been fairly constant. )   HPI   History of myelofibrosis and CKD. Dizziness x 1 day, notices mostly when standing up. Does not notice with head motion. Denies palpitations or chest pain. Eating and drinking fine. Sees Dr. Holley Raring at [facility]. Still produces urine. Is currently taking 40 mg Lasix for lower extremity edema prescribed to him by kidney center. Recently received medication to help increase blood production from oncology for his myelofibrosis.   Social History   Tobacco Use  . Smoking status: Former Smoker    Packs/day: 1.00    Types: Cigarettes    Last attempt to quit: 01/11/2018    Years since quitting: 0.3  . Smokeless tobacco: Never Used  Substance Use Topics  . Alcohol use: No    Alcohol/week: 0.0 standard drinks    Frequency: Never    Comment: patient states he has not consumed alcohol in the last month  . Drug use: No    Review of Systems Per HPI unless specifically indicated above     Objective:    Wt 175 lb (79.4 kg)   SpO2 98%   BMI 27.41 kg/m   Wt Readings from Last 3 Encounters:  05/10/18 175 lb (79.4 kg)  04/16/18 171 lb 9.6 oz (77.8 kg)  02/19/18 173 lb 2 oz (78.5 kg)    Physical Exam  Constitutional: He is oriented to person, place, and time. He appears well-developed and well-nourished.  Cardiovascular: Normal rate and regular rhythm.  Pulmonary/Chest: Effort normal and breath sounds normal.  Neurological: He is alert and oriented to person, place, and time.  Skin: Skin is warm and dry. Capillary refill takes less than 2 seconds. No pallor.  Psychiatric: He has a normal mood and affect. His behavior is normal.   Results for orders placed or performed in visit on 05/10/18  Comp Met (CMET)  Result Value Ref Range   Glucose 108 (H) 65  - 99 mg/dL   BUN 62 (H) 8 - 27 mg/dL   Creatinine, Ser 3.16 (H) 0.76 - 1.27 mg/dL   GFR calc non Af Amer 20 (L) >59 mL/min/1.73   GFR calc Af Amer 23 (L) >59 mL/min/1.73   BUN/Creatinine Ratio 20 10 - 24   Sodium 140 134 - 144 mmol/L   Potassium 5.8 (H) 3.5 - 5.2 mmol/L   Chloride 107 (H) 96 - 106 mmol/L   CO2 17 (L) 20 - 29 mmol/L   Calcium 9.2 8.6 - 10.2 mg/dL   Total Protein 5.9 (L) 6.0 - 8.5 g/dL   Albumin 4.1 3.6 - 4.8 g/dL   Globulin, Total 1.8 1.5 - 4.5 g/dL   Albumin/Globulin Ratio 2.3 (H) 1.2 - 2.2   Bilirubin Total 0.2 0.0 - 1.2 mg/dL   Alkaline Phosphatase 53 39 - 117 IU/L   AST 9 0 - 40 IU/L   ALT 8 0 - 44 IU/L  CBC with Differential  Result Value Ref Range   WBC 7.0 3.4 - 10.8 x10E3/uL   RBC 3.66 (L) 4.14 - 5.80 x10E6/uL   Hemoglobin 10.2 (L) 13.0 - 17.7 g/dL   Hematocrit 31.8 (L) 37.5 - 51.0 %   MCV 87 79 - 97 fL   MCH 27.9 26.6 - 33.0 pg   MCHC 32.1  31.5 - 35.7 g/dL   RDW 17.5 (H) 12.3 - 15.4 %   Platelets 208 150 - 450 x10E3/uL   Neutrophils 89 Not Estab. %   Lymphs 9 Not Estab. %   Monocytes 2 Not Estab. %   Eos 0 Not Estab. %   Basos 0 Not Estab. %   Neutrophils Absolute 6.2 1.4 - 7.0 x10E3/uL   Lymphocytes Absolute 0.6 (L) 0.7 - 3.1 x10E3/uL   Monocytes Absolute 0.1 0.1 - 0.9 x10E3/uL   EOS (ABSOLUTE) 0.0 0.0 - 0.4 x10E3/uL   Basophils Absolute 0.0 0.0 - 0.2 x10E3/uL   Immature Granulocytes 0 Not Estab. %   Immature Grans (Abs) 0.0 0.0 - 0.1 x10E3/uL      Assessment & Plan:  1. Dizziness  Orthostatic hypotension on vital signs today. Will have him decrease Lasix to 20 mg daily and follow up in one week. Will also get CBC to check for anemia.  2. Myelofibrosis (H[facility])  - CBC with Differential  3. CKD (chronic kidney disease), stage III ([facility])  - Comp Met (CMET)  4. Benign hypertensive renal disease  - furosemide (LASIX) 20 MG tablet; Take 1 tablet (20 mg total) by mouth daily.  Dispense: 90 tablet; Refill: 0    Follow up plan: Return in  about 1 week (around 05/17/2018) for dizziness .  Carles Collet, PA-C [facility] 05/11/2018, 12:18 PM

## 2018-05-10 NOTE — Telephone Encounter (Signed)
Attempted CB,  two different numbers. "Call restrictions,"  Unable to reach or leave VM.

## 2018-05-11 LAB — COMPREHENSIVE METABOLIC PANEL
ALT: 8 IU/L (ref 0–44)
AST: 9 IU/L (ref 0–40)
Albumin/Globulin Ratio: 2.3 — ABNORMAL HIGH (ref 1.2–2.2)
Albumin: 4.1 g/dL (ref 3.6–4.8)
Alkaline Phosphatase: 53 IU/L (ref 39–117)
BUN/Creatinine Ratio: 20 (ref 10–24)
BUN: 62 mg/dL — ABNORMAL HIGH (ref 8–27)
Bilirubin Total: 0.2 mg/dL (ref 0.0–1.2)
CO2: 17 mmol/L — ABNORMAL LOW (ref 20–29)
Calcium: 9.2 mg/dL (ref 8.6–10.2)
Chloride: 107 mmol/L — ABNORMAL HIGH (ref 96–106)
Creatinine, Ser: 3.16 mg/dL — ABNORMAL HIGH (ref 0.76–1.27)
GFR calc Af Amer: 23 mL/min/{1.73_m2} — ABNORMAL LOW (ref >59)
GFR calc non Af Amer: 20 mL/min/{1.73_m2} — ABNORMAL LOW (ref >59)
Globulin, Total: 1.8 g/dL (ref 1.5–4.5)
Glucose: 108 mg/dL — ABNORMAL HIGH (ref 65–99)
Potassium: 5.8 mmol/L — ABNORMAL HIGH (ref 3.5–5.2)
Sodium: 140 mmol/L (ref 134–144)
Total Protein: 5.9 g/dL — ABNORMAL LOW (ref 6.0–8.5)

## 2018-05-11 LAB — CBC WITH DIFFERENTIAL/PLATELET
Basophils Absolute: 0 10*3/uL (ref 0.0–0.2)
Basos: 0 %
EOS (ABSOLUTE): 0 10*3/uL (ref 0.0–0.4)
Eos: 0 %
Hematocrit: 31.8 % — ABNORMAL LOW (ref 37.5–51.0)
Hemoglobin: 10.2 g/dL — ABNORMAL LOW (ref 13.0–17.7)
Immature Grans (Abs): 0 10*3/uL (ref 0.0–0.1)
Immature Granulocytes: 0 %
Lymphocytes Absolute: 0.6 10*3/uL — ABNORMAL LOW (ref 0.7–3.1)
Lymphs: 9 %
MCH: 27.9 pg (ref 26.6–33.0)
MCHC: 32.1 g/dL (ref 31.5–35.7)
MCV: 87 fL (ref 79–97)
Monocytes Absolute: 0.1 10*3/uL (ref 0.1–0.9)
Monocytes: 2 %
Neutrophils Absolute: 6.2 10*3/uL (ref 1.4–7.0)
Neutrophils: 89 %
Platelets: 208 10*3/uL (ref 150–450)
RBC: 3.66 x10E6/uL — ABNORMAL LOW (ref 4.14–5.80)
RDW: 17.5 % — ABNORMAL HIGH (ref 12.3–15.4)
WBC: 7 10*3/uL (ref 3.4–10.8)

## 2018-05-13 ENCOUNTER — Other Ambulatory Visit: Payer: Self-pay | Admitting: Physician Assistant

## 2018-05-13 DIAGNOSIS — E875 Hyperkalemia: Secondary | ICD-10-CM

## 2018-05-17 ENCOUNTER — Inpatient Hospital Stay: Payer: Medicare HMO

## 2018-05-17 ENCOUNTER — Other Ambulatory Visit: Payer: Self-pay

## 2018-05-17 ENCOUNTER — Other Ambulatory Visit: Payer: Self-pay | Admitting: Internal Medicine

## 2018-05-17 DIAGNOSIS — D631 Anemia in chronic kidney disease: Secondary | ICD-10-CM | POA: Diagnosis not present

## 2018-05-17 DIAGNOSIS — N183 Chronic kidney disease, stage 3 (moderate): Secondary | ICD-10-CM | POA: Diagnosis not present

## 2018-05-17 DIAGNOSIS — I129 Hypertensive chronic kidney disease with stage 1 through stage 4 chronic kidney disease, or unspecified chronic kidney disease: Secondary | ICD-10-CM | POA: Diagnosis not present

## 2018-05-17 DIAGNOSIS — D471 Chronic myeloproliferative disease: Secondary | ICD-10-CM

## 2018-05-17 DIAGNOSIS — Z79899 Other long term (current) drug therapy: Secondary | ICD-10-CM | POA: Diagnosis not present

## 2018-05-17 LAB — CBC WITH DIFFERENTIAL/PLATELET
Basophils Absolute: 0 10*3/uL (ref 0–0.1)
Basophils Relative: 0 %
Eosinophils Absolute: 0 10*3/uL (ref 0–0.7)
Eosinophils Relative: 0 %
HEMATOCRIT: 30.5 % — AB (ref 40.0–52.0)
HEMOGLOBIN: 10.2 g/dL — AB (ref 13.0–18.0)
LYMPHS PCT: 10 %
Lymphs Abs: 0.5 10*3/uL — ABNORMAL LOW (ref 1.0–3.6)
MCH: 29.2 pg (ref 26.0–34.0)
MCHC: 33.4 g/dL (ref 32.0–36.0)
MCV: 87.6 fL (ref 80.0–100.0)
Monocytes Absolute: 0.2 10*3/uL (ref 0.2–1.0)
Monocytes Relative: 3 %
NEUTROS ABS: 4.8 10*3/uL (ref 1.4–6.5)
Neutrophils Relative %: 87 %
Platelets: 134 10*3/uL — ABNORMAL LOW (ref 150–440)
RBC: 3.49 MIL/uL — AB (ref 4.40–5.90)
RDW: 18 % — ABNORMAL HIGH (ref 11.5–14.5)
WBC: 5.5 10*3/uL (ref 3.8–10.6)

## 2018-05-17 LAB — COMPREHENSIVE METABOLIC PANEL
ALBUMIN: 3.7 g/dL (ref 3.5–5.0)
ALT: 11 U/L (ref 0–44)
AST: 13 U/L — ABNORMAL LOW (ref 15–41)
Alkaline Phosphatase: 43 U/L (ref 38–126)
Anion gap: 8 (ref 5–15)
BUN: 68 mg/dL — AB (ref 8–23)
CHLORIDE: 109 mmol/L (ref 98–111)
CO2: 20 mmol/L — ABNORMAL LOW (ref 22–32)
Calcium: 8.8 mg/dL — ABNORMAL LOW (ref 8.9–10.3)
Creatinine, Ser: 3.1 mg/dL — ABNORMAL HIGH (ref 0.61–1.24)
GFR calc Af Amer: 23 mL/min — ABNORMAL LOW (ref >60)
GFR calc non Af Amer: 20 mL/min — ABNORMAL LOW (ref >60)
GLUCOSE: 104 mg/dL — AB (ref 70–99)
POTASSIUM: 6.3 mmol/L — AB (ref 3.5–5.1)
Sodium: 137 mmol/L (ref 135–145)
Total Bilirubin: 0.5 mg/dL (ref 0.3–1.2)
Total Protein: 6.4 g/dL — ABNORMAL LOW (ref 6.5–8.1)

## 2018-05-17 LAB — IRON AND TIBC
Iron: 71 ug/dL (ref 45–182)
SATURATION RATIOS: 27 % (ref 17.9–39.5)
TIBC: 260 ug/dL (ref 250–450)
UIBC: 189 ug/dL

## 2018-05-17 LAB — FERRITIN: Ferritin: 172 ng/mL (ref 24–336)

## 2018-05-17 MED ORDER — SODIUM POLYSTYRENE SULFONATE PO POWD: ORAL | 0 refills | Status: DC

## 2018-05-17 NOTE — Progress Notes (Signed)
Repeat Potassium- 6.3; HOLD OFF cozaar;  # Heather- start kayexalate; have pt call Dr.[name]'s office for further recommendations. Informed Dr.[name]/ awaiting call back. Will inform pt of above.

## 2018-05-18 ENCOUNTER — Encounter: Payer: Self-pay | Admitting: Physician Assistant

## 2018-05-18 ENCOUNTER — Encounter: Payer: Self-pay | Admitting: Emergency Medicine

## 2018-05-18 ENCOUNTER — Ambulatory Visit (INDEPENDENT_AMBULATORY_CARE_PROVIDER_SITE_OTHER): Payer: Medicare HMO | Admitting: Physician Assistant

## 2018-05-18 ENCOUNTER — Other Ambulatory Visit: Payer: Self-pay

## 2018-05-18 ENCOUNTER — Emergency Department
Admission: EM | Admit: 2018-05-18 | Discharge: 2018-05-18 | Disposition: A | Discharge status: Home / Self Care | Payer: Medicare HMO | Attending: Emergency Medicine | Admitting: Emergency Medicine

## 2018-05-18 VITALS — BP 129/74 | HR 64 | Temp 97.5°F | Wt 177.6 lb

## 2018-05-18 DIAGNOSIS — I5030 Unspecified diastolic (congestive) heart failure: Secondary | ICD-10-CM | POA: Diagnosis not present

## 2018-05-18 DIAGNOSIS — E875 Hyperkalemia: Secondary | ICD-10-CM

## 2018-05-18 DIAGNOSIS — I2510 Atherosclerotic heart disease of native coronary artery without angina pectoris: Secondary | ICD-10-CM | POA: Insufficient documentation

## 2018-05-18 DIAGNOSIS — Z955 Presence of coronary angioplasty implant and graft: Secondary | ICD-10-CM | POA: Insufficient documentation

## 2018-05-18 DIAGNOSIS — N183 Chronic kidney disease, stage 3 unspecified: Secondary | ICD-10-CM

## 2018-05-18 DIAGNOSIS — N184 Chronic kidney disease, stage 4 (severe): Secondary | ICD-10-CM | POA: Diagnosis not present

## 2018-05-18 DIAGNOSIS — I130 Hypertensive heart and chronic kidney disease with heart failure and stage 1 through stage 4 chronic kidney disease, or unspecified chronic kidney disease: Secondary | ICD-10-CM | POA: Diagnosis not present

## 2018-05-18 DIAGNOSIS — Z87891 Personal history of nicotine dependence: Secondary | ICD-10-CM | POA: Insufficient documentation

## 2018-05-18 DIAGNOSIS — Z79899 Other long term (current) drug therapy: Secondary | ICD-10-CM | POA: Insufficient documentation

## 2018-05-18 DIAGNOSIS — R42 Dizziness and giddiness: Secondary | ICD-10-CM | POA: Diagnosis present

## 2018-05-18 LAB — BASIC METABOLIC PANEL
Anion gap: 9 (ref 5–15)
BUN: 65 mg/dL — ABNORMAL HIGH (ref 8–23)
CALCIUM: 9 mg/dL (ref 8.9–10.3)
CO2: 20 mmol/L — ABNORMAL LOW (ref 22–32)
CREATININE: 3.27 mg/dL — AB (ref 0.61–1.24)
Chloride: 111 mmol/L (ref 98–111)
GFR, EST AFRICAN AMERICAN: 22 mL/min — AB (ref >60)
GFR, EST NON AFRICAN AMERICAN: 19 mL/min — AB (ref >60)
Glucose, Bld: 102 mg/dL — ABNORMAL HIGH (ref 70–99)
Potassium: 5.4 mmol/L — ABNORMAL HIGH (ref 3.5–5.1)
SODIUM: 140 mmol/L (ref 135–145)

## 2018-05-18 NOTE — Progress Notes (Signed)
RN Spoke with patient's wife on 8/26 with md recommendations.

## 2018-05-18 NOTE — Progress Notes (Signed)
Subjective:    [name], male    DOB: Jun 05, 1956, 62 y.o.   MRN: [id number]  James Arnold is a 62 y.o. male presenting on 05/18/2018 for Dizziness (follow up- pt states the dizziness is better, had a spell last Wednesday and one this morning )   HPI   Patient is seen today in follow up. He has CKD Stage IV and is seen by Dr. Holley Raring in nephrology. Last week he had orthostatic hypotension and his lasix was decreased from 40 mg QD to 20 mg QD. His CMET showed hyperkalemia at 5.8 and he was instructed to hold his losartan and return for repeat labs. He did not return for labs, he is still taking losartan. He was seen yesterday at the cancer center and had labs drawn. His potassium was 6.3 and his oncologist sent in Kayexalate. However, he has not picked this up. Today, he reports he had an episode of dizziness this past week in the shower that lasted for an hour. He reports he felt like he was drunk. He denies chest pain and palpitations.   Social History   Tobacco Use  . Smoking status: Former Smoker    Packs/day: 1.00    Types: Cigarettes    Last attempt to quit: 01/11/2018    Years since quitting: 0.3  . Smokeless tobacco: Never Used  Substance Use Topics  . Alcohol use: No    Alcohol/week: 0.0 standard drinks    Frequency: Never    Comment: patient states he has not consumed alcohol in the last month  . Drug use: No    Review of Systems Per HPI unless specifically indicated above     Objective:    BP 129/74   Pulse 64   Temp (!) 97.5 F (36.4 C) (Oral)   Wt 177 lb 9.6 oz (80.6 kg)   SpO2 97%   BMI 27.82 kg/m   Wt Readings from Last 3 Encounters:  05/18/18 177 lb 9.6 oz (80.6 kg)  05/10/18 175 lb (79.4 kg)  04/16/18 171 lb 9.6 oz (77.8 kg)    Physical Exam  Constitutional: He is oriented to person, place, and time. He appears well-developed and well-nourished.  Cardiovascular: Normal rate and regular rhythm.  Pulmonary/Chest: Effort normal and breath  sounds normal.  Abdominal: Soft. Bowel sounds are normal.  Neurological: He is alert and oriented to person, place, and time.  Psychiatric: He has a normal mood and affect. His behavior is normal.   Results for orders placed or performed in visit on 05/17/18  Ferritin  Result Value Ref Range   Ferritin 172 24 - 336 ng/mL  CBC with Differential/Platelet  Result Value Ref Range   WBC 5.5 3.8 - 10.6 K/uL   RBC 3.49 (L) 4.40 - 5.90 MIL/uL   Hemoglobin 10.2 (L) 13.0 - 18.0 g/dL   HCT 30.5 (L) 40.0 - 52.0 %   MCV 87.6 80.0 - 100.0 fL   MCH 29.2 26.0 - 34.0 pg   MCHC 33.4 32.0 - 36.0 g/dL   RDW 18.0 (H) 11.5 - 14.5 %   Platelets 134 (L) 150 - 440 K/uL   Neutrophils Relative % 87 %   Neutro Abs 4.8 1.4 - 6.5 K/uL   Lymphocytes Relative 10 %   Lymphs Abs 0.5 (L) 1.0 - 3.6 K/uL   Monocytes Relative 3 %   Monocytes Absolute 0.2 0.2 - 1.0 K/uL   Eosinophils Relative 0 %   Eosinophils Absolute 0.0 0 -  0.7 K/uL   Basophils Relative 0 %   Basophils Absolute 0.0 0 - 0.1 K/uL  Iron and TIBC  Result Value Ref Range   Iron 71 45 - 182 ug/dL   TIBC 260 250 - 450 ug/dL   Saturation Ratios 27 17.9 - 39.5 %   UIBC 189 ug/dL  Comprehensive metabolic panel  Result Value Ref Range   Sodium 137 135 - 145 mmol/L   Potassium 6.3 (HH) 3.5 - 5.1 mmol/L   Chloride 109 98 - 111 mmol/L   CO2 20 (L) 22 - 32 mmol/L   Glucose, Bld 104 (H) 70 - 99 mg/dL   BUN 68 (H) 8 - 23 mg/dL   Creatinine, Ser 3.10 (H) 0.61 - 1.24 mg/dL   Calcium 8.8 (L) 8.9 - 10.3 mg/dL   Total Protein 6.4 (L) 6.5 - 8.1 g/dL   Albumin 3.7 3.5 - 5.0 g/dL   AST 13 (L) 15 - 41 U/L   ALT 11 0 - 44 U/L   Alkaline Phosphatase 43 38 - 126 U/L   Total Bilirubin 0.5 0.3 - 1.2 mg/dL   GFR calc non Af Amer 20 (L) >60 mL/min   GFR calc Af Amer 23 (L) >60 mL/min   Anion gap 8 5 - 15      Assessment & Plan:  1. Hyperkalemia  Last potassium is 6.3. Patient has peaked T-waves on EKG. He has not yet picked up kayexalate and is still taking  his losartan. I am sending him to [facility] for acute management of this. Have directed patient to the ER and called ahead to the ER.   - EKG 12-Lead  2. CKD (chronic kidney disease), stage III (HCC)  - Comp Met (CMET)  I have spent 25 minutes with this patient, >50% of which was spent on counseling and coordination of care.   Follow up plan: Return in about 2 days (around 05/20/2018) for potassium.  Carles Collet, PA-C [facility] 05/18/2018, 9:13 AM

## 2018-05-18 NOTE — ED Provider Notes (Signed)
[facility] Emergency Department Provider Note   ____________________________________________    I have reviewed the triage vital signs and the nursing notes.   HISTORY  Chief Complaint Abnormal Lab     HPI James Arnold is a 62 y.o. male sent in by his PCP for elevated potassium.  Apparently his potassium was 6.3 at his doctor's office today.  Review of records demonstrates that his potassium is been elevated over the last week, he does have a history of chronic kidney disease.  Reportedly he was to get Kayexalate but it was unavailable at the pharmacy.  Overall he feels well, he reports some mild dizziness but no palpitations or chest pain.  Does take losartan   Past Medical History:  Diagnosis Date  . Anemia   . Benign hypertensive renal disease   . CAD (coronary artery disease)    a. 08/1996 s/p BMS to the mLAD ([facility]); b. 12/2017 MV: EF 46%, fixed apical defect w/ significant GI uptake artifact. No ischemia-> low risk.  . CKD (chronic kidney disease), stage IV ([place])   . Depression   . Diastolic dysfunction    a. 12/2017 Echo: EF 60-65%, no rwma, Gr1 DD, mild AI/MR. Nl RVSP.  [name] Kitchen Dyspnea    with exertion  . FSGS (focal segmental glomerulosclerosis)   . Hyperlipidemia 10/2002  . Hypertension   . Myelofibrosis ([place]) 2010  . Smoker   . Thrombocytopenia [facility])     Patient Active Problem List   Diagnosis Date Noted  . 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  . Anemia of chronic kidney failure, stage 3 (moderate) ([place]) 12/30/2017  . Chest pain 12/29/2017  . Proteinuria 11/25/2017  . Gout 08/27/2017  . Acute renal failure (ARF) ([place]) 05/15/2017  . Primary myelofibrosis ([place])   . Depression   . Benign hypertensive renal disease   . CKD (chronic kidney disease), stage III ([place])   . Hyperlipidemia 10/23/2002  . CAD (coronary artery disease) 08/22/1996    Past Surgical History:  Procedure Laterality Date  . ANGIOPLASTY    . BONE  MARROW ASPIRATION  03/2009  . CORONARY STENT PLACEMENT  08/1996    Prior to Admission medications   Medication Sig Start Date End Date Taking? Authorizing Provider  allopurinol (ZYLOPRIM) 100 MG tablet Take 0.5 tablets (50 mg total) by mouth daily. 09/25/17   Johnson, Megan P, DO  amLODipine (NORVASC) 10 MG tablet Take 1 tablet (10 mg total) by mouth daily. 02/19/18 03/21/18  Johnson, Megan P, DO  carvedilol (COREG) 12.5 MG tablet Take 1 tablet (12.5 mg total) by mouth 2 (two) times daily. 01/28/18 04/28/18  Theora Gianotti, NP  citalopram (CELEXA) 20 MG tablet Take 1 tablet (20 mg total) by mouth daily. 02/19/18   Park Liter P, DO  ferrous sulfate 325 (65 FE) MG tablet Take 1 tablet (325 mg total) by mouth daily with breakfast. 12/30/17 01/29/18  Saundra Shelling, MD  furosemide (LASIX) 20 MG tablet Take 1 tablet (20 mg total) by mouth daily. 05/10/18   Trinna Post, PA-C  losartan (COZAAR) 50 MG tablet Take 1 tablet (50 mg total) by mouth daily. 02/19/18   Johnson, Megan P, DO  sodium polystyrene (KAYEXALATE) powder 15 ml every 4-6 hours; until 2 loose stools. 05/17/18   Cammie Sickle, MD  traMADol (ULTRAM) 50 MG tablet Take 1 tablet (50 mg total) by mouth every 8 (eight) hours as needed. 01/22/18   Park Liter P, DO     Allergies  Patient has no known allergies.  Family History  Problem Relation Age of Onset  . Stroke Mother        Low BP stroke  . Depression Father   . Depression Sister        Breast  . Heart disease Unknown   . Breast cancer Unknown   . Lung cancer Unknown   . Ovarian cancer Unknown   . Stomach cancer Unknown     Social History Social History   Tobacco Use  . Smoking status: Former Smoker    Packs/day: 1.00    Types: Cigarettes    Last attempt to quit: 01/11/2018    Years since quitting: 0.3  . Smokeless tobacco: Never Used  Substance Use Topics  . Alcohol use: No    Alcohol/week: 0.0 standard drinks    Frequency: Never    Comment:  patient states he has not consumed alcohol in the last month  . Drug use: No    Review of Systems  Constitutional: No fever/chills Eyes: No visual changes.  ENT: No sore throat. Cardiovascular: Denies chest pain. Respiratory: Denies shortness of breath. Gastrointestinal: No abdominal pain.  No nausea, no vomiting.   Genitourinary: Negative for dysuria. Musculoskeletal: Negative for back pain. Skin: Negative for rash. Neurological: Negative for headaches   ____________________________________________   PHYSICAL EXAM:  VITAL SIGNS: ED Triage Vitals [05/18/18 0947]  Enc Vitals Group     BP 137/61     Pulse Rate 62     Resp 18     Temp 97.6 F (36.4 C)     Temp Source Oral     SpO2 100 %     Weight 80.3 kg (177 lb)     Height 1.702 m (5\' 7" )     Head Circumference      Peak Flow      Pain Score 0     Pain Loc      Pain Edu?      Excl. in [place]?     Constitutional: Alert and oriented. No acute distress. Pleasant and interactive  Nose: No congestion/rhinnorhea. Mouth/Throat: Mucous membranes are moist.    Cardiovascular: Normal rate, regular rhythm. Grossly normal heart sounds.  Good peripheral circulation. Respiratory: Normal respiratory effort.  No retractions. Lungs CTAB. Gastrointestinal: Soft and nontender. No distention.  No CVA tenderness. Genitourinary: deferred Musculoskeletal: No lower extremity tenderness nor edema.  Warm and well perfused Neurologic:  Normal speech and language. No gross focal neurologic deficits are appreciated.  Skin:  Skin is warm, dry and intact. No rash noted. Psychiatric: Mood and affect are normal. Speech and behavior are normal.  ____________________________________________   LABS (all labs ordered are listed, but only abnormal results are displayed)  Labs Reviewed  BASIC METABOLIC PANEL - Abnormal; Notable for the following components:      Result Value   Potassium 5.4 (*)    CO2 20 (*)    Glucose, Bld 102 (*)    BUN 65  (*)    Creatinine, Ser 3.27 (*)    GFR calc non Af Amer 19 (*)    GFR calc Af Amer 22 (*)    All other components within normal limits   ____________________________________________  EKG  ED ECG REPORT I, Lavonia Drafts, the attending physician, personally viewed and interpreted this ECG.  Date: 05/18/2018  Rhythm: normal sinus rhythm QRS Axis: normal Intervals: normal ST/T Wave abnormalities: normal Narrative Interpretation: no changes of hyperkalemia  ____________________________________________  RADIOLOGY  None ____________________________________________  PROCEDURES  Procedure(s) performed: No  Procedures   Critical Care performed: No ____________________________________________   INITIAL IMPRESSION / ASSESSMENT AND PLAN / ED COURSE  Pertinent labs & imaging results that were available during my care of the patient were reviewed by me and considered in my medical decision making (see chart for details).  Patient well-appearing in no acute distress.  Vital signs are unremarkable.  EKG is normal, no changes of hyperkalemia noted.  Patient placed on cardiac monitor, IV inserted, will recheck BMP here to confirm   Potassium here is 5.4, significant improved.  Discussed with Dr. Juleen [name] of nephrology, he recommends stopping losartan.  He will provide samples of V[place] for potassium reduction in his office, patient will go there from the ED    ____________________________________________   FINAL CLINICAL IMPRESSION(S) / ED DIAGNOSES  Final diagnoses:  Hyperkalemia        Note:  This document was prepared using Dragon voice recognition software and may include unintentional dictation errors.    Lavonia Drafts, MD 05/18/18 1340

## 2018-05-18 NOTE — Patient Instructions (Addendum)
DO NOT TAKE YOUR LOSARTAN/COZAAR, that is a blood pressure pill. Please stop this to help your potassium go down. Please take the kayexalate that Dr. B sent you. Please await call from cancer center and kidney doctor. Please call Dr. Holley Raring and tell him your potassium is 6.3, you have been prescribed Kayexalate 15 mL every 4-6 hours until 2 loose stools, and you are going to stop taking Losartan

## 2018-05-18 NOTE — ED Triage Notes (Signed)
Pt was at pcp this morning and was told to come in to ED due to hyperkalemia. Pt was given order to take kayexalate but pharmacy was unable to fill order. Pt was then told by PCP today to come in to ED. Pt denies any pain.

## 2018-05-18 NOTE — Discharge Instructions (Addendum)
Please stop taking losartan. Continue lasix. Please go by the [place] office for some samples of Valtassa which decreases

## 2018-05-19 LAB — COMPREHENSIVE METABOLIC PANEL
ALT: 8 IU/L (ref 0–44)
AST: 13 IU/L (ref 0–40)
Albumin/Globulin Ratio: 2.4 — ABNORMAL HIGH (ref 1.2–2.2)
Albumin: 4.1 g/dL (ref 3.6–4.8)
Alkaline Phosphatase: 49 IU/L (ref 39–117)
BUN/Creatinine Ratio: 20 (ref 10–24)
BUN: 61 mg/dL — ABNORMAL HIGH (ref 8–27)
Bilirubin Total: <0.2 mg/dL (ref 0.0–1.2)
CO2: 16 mmol/L — ABNORMAL LOW (ref 20–29)
Calcium: 8.8 mg/dL (ref 8.6–10.2)
Chloride: 109 mmol/L — ABNORMAL HIGH (ref 96–106)
Creatinine, Ser: 3.02 mg/dL — ABNORMAL HIGH (ref 0.76–1.27)
GFR calc Af Amer: 24 mL/min/{1.73_m2} — ABNORMAL LOW (ref >59)
GFR calc non Af Amer: 21 mL/min/{1.73_m2} — ABNORMAL LOW (ref >59)
Globulin, Total: 1.7 g/dL (ref 1.5–4.5)
Glucose: 99 mg/dL (ref 65–99)
Potassium: 5.4 mmol/L — ABNORMAL HIGH (ref 3.5–5.2)
Sodium: 140 mmol/L (ref 134–144)
Total Protein: 5.8 g/dL — ABNORMAL LOW (ref 6.0–8.5)

## 2018-05-20 ENCOUNTER — Telehealth: Payer: Self-pay | Admitting: Family Medicine

## 2018-05-20 DIAGNOSIS — E875 Hyperkalemia: Secondary | ICD-10-CM

## 2018-05-20 NOTE — Telephone Encounter (Signed)
Copied from [place] [id number]. Topic: Inquiry >> May 19, 2018 11:46 AM Conception Chancy, NT wrote: Reason for CRM: patient wife is calling and states at the patients visit yesterday on 05/18/18 he was sent to the ER to get IV fluid to lower potassium. She states they did not do that and sent him to his Kidney Doctor to get medicine to take by mouth. She states that the hospital doctor told him to come back to his pcp on 05/25/18 and get labs redrawn. The orders will need to be placed. Please contact when he is able to come. >> May 19, 2018  1:38 PM Don Perking M wrote: Please advise.

## 2018-05-20 NOTE — Telephone Encounter (Signed)
Sounds good

## 2018-05-20 NOTE — Telephone Encounter (Signed)
Order in. He can come to get it drawn on Tuesday.

## 2018-05-20 NOTE — Telephone Encounter (Signed)
Patient's wife states that they will go to the kidney doctor.

## 2018-05-26 DIAGNOSIS — N041 Nephrotic syndrome with focal and segmental glomerular lesions: Secondary | ICD-10-CM | POA: Diagnosis not present

## 2018-05-26 DIAGNOSIS — N183 Chronic kidney disease, stage 3 (moderate): Secondary | ICD-10-CM | POA: Diagnosis not present

## 2018-05-26 DIAGNOSIS — R809 Proteinuria, unspecified: Secondary | ICD-10-CM | POA: Diagnosis not present

## 2018-06-10 DIAGNOSIS — N041 Nephrotic syndrome with focal and segmental glomerular lesions: Secondary | ICD-10-CM | POA: Diagnosis not present

## 2018-06-10 DIAGNOSIS — N2581 Secondary hyperparathyroidism of renal origin: Secondary | ICD-10-CM | POA: Diagnosis not present

## 2018-06-10 DIAGNOSIS — N183 Chronic kidney disease, stage 3 (moderate): Secondary | ICD-10-CM | POA: Diagnosis not present

## 2018-06-10 DIAGNOSIS — I10 Essential (primary) hypertension: Secondary | ICD-10-CM | POA: Diagnosis not present

## 2018-06-10 DIAGNOSIS — N184 Chronic kidney disease, stage 4 (severe): Secondary | ICD-10-CM | POA: Diagnosis not present

## 2018-06-10 DIAGNOSIS — D631 Anemia in chronic kidney disease: Secondary | ICD-10-CM | POA: Diagnosis not present

## 2018-06-18 ENCOUNTER — Inpatient Hospital Stay: Payer: 59 | Attending: Internal Medicine

## 2018-06-18 ENCOUNTER — Telehealth: Payer: Self-pay | Admitting: Pharmacy Technician

## 2018-06-18 ENCOUNTER — Encounter: Payer: Self-pay | Admitting: Internal Medicine

## 2018-06-18 ENCOUNTER — Other Ambulatory Visit: Payer: Self-pay

## 2018-06-18 ENCOUNTER — Inpatient Hospital Stay (HOSPITAL_BASED_OUTPATIENT_CLINIC_OR_DEPARTMENT_OTHER): Payer: 59 | Admitting: Internal Medicine

## 2018-06-18 ENCOUNTER — Inpatient Hospital Stay: Payer: 59

## 2018-06-18 VITALS — BP 159/73 | HR 73 | Temp 97.3°F | Resp 16 | Wt 176.0 lb

## 2018-06-18 DIAGNOSIS — E876 Hypokalemia: Secondary | ICD-10-CM | POA: Diagnosis not present

## 2018-06-18 DIAGNOSIS — F329 Major depressive disorder, single episode, unspecified: Secondary | ICD-10-CM | POA: Insufficient documentation

## 2018-06-18 DIAGNOSIS — D631 Anemia in chronic kidney disease: Secondary | ICD-10-CM | POA: Diagnosis not present

## 2018-06-18 DIAGNOSIS — F17210 Nicotine dependence, cigarettes, uncomplicated: Secondary | ICD-10-CM | POA: Insufficient documentation

## 2018-06-18 DIAGNOSIS — I2510 Atherosclerotic heart disease of native coronary artery without angina pectoris: Secondary | ICD-10-CM | POA: Insufficient documentation

## 2018-06-18 DIAGNOSIS — Z79899 Other long term (current) drug therapy: Secondary | ICD-10-CM | POA: Insufficient documentation

## 2018-06-18 DIAGNOSIS — D696 Thrombocytopenia, unspecified: Secondary | ICD-10-CM | POA: Diagnosis not present

## 2018-06-18 DIAGNOSIS — N183 Chronic kidney disease, stage 3 (moderate): Secondary | ICD-10-CM

## 2018-06-18 DIAGNOSIS — I129 Hypertensive chronic kidney disease with stage 1 through stage 4 chronic kidney disease, or unspecified chronic kidney disease: Secondary | ICD-10-CM

## 2018-06-18 DIAGNOSIS — D471 Chronic myeloproliferative disease: Secondary | ICD-10-CM

## 2018-06-18 DIAGNOSIS — E785 Hyperlipidemia, unspecified: Secondary | ICD-10-CM

## 2018-06-18 LAB — CBC WITH DIFFERENTIAL/PLATELET
Basophils Absolute: 0 10*3/uL (ref 0–0.1)
Basophils Relative: 0 %
EOS ABS: 0 10*3/uL (ref 0–0.7)
EOS PCT: 0 %
HCT: 30.6 % — ABNORMAL LOW (ref 40.0–52.0)
Hemoglobin: 10.4 g/dL — ABNORMAL LOW (ref 13.0–18.0)
LYMPHS ABS: 0.6 10*3/uL — AB (ref 1.0–3.6)
LYMPHS PCT: 9 %
MCH: 29 pg (ref 26.0–34.0)
MCHC: 34.1 g/dL (ref 32.0–36.0)
MCV: 85.1 fL (ref 80.0–100.0)
Monocytes Absolute: 0.1 10*3/uL — ABNORMAL LOW (ref 0.2–1.0)
Monocytes Relative: 2 %
NEUTROS PCT: 89 %
Neutro Abs: 5.6 10*3/uL (ref 1.4–6.5)
PLATELETS: 124 10*3/uL — AB (ref 150–440)
RBC: 3.59 MIL/uL — AB (ref 4.40–5.90)
RDW: 17.5 % — ABNORMAL HIGH (ref 11.5–14.5)
WBC: 6.4 10*3/uL (ref 3.8–10.6)

## 2018-06-18 LAB — IRON AND TIBC
Iron: 75 ug/dL (ref 45–182)
Saturation Ratios: 29 % (ref 17.9–39.5)
TIBC: 257 ug/dL (ref 250–450)
UIBC: 182 ug/dL

## 2018-06-18 LAB — COMPREHENSIVE METABOLIC PANEL
ALT: 11 U/L (ref 0–44)
AST: 15 U/L (ref 15–41)
Albumin: 3.5 g/dL (ref 3.5–5.0)
Alkaline Phosphatase: 46 U/L (ref 38–126)
Anion gap: 7 (ref 5–15)
BILIRUBIN TOTAL: 0.6 mg/dL (ref 0.3–1.2)
BUN: 47 mg/dL — ABNORMAL HIGH (ref 8–23)
CHLORIDE: 112 mmol/L — AB (ref 98–111)
CO2: 18 mmol/L — ABNORMAL LOW (ref 22–32)
CREATININE: 3.5 mg/dL — AB (ref 0.61–1.24)
Calcium: 8.8 mg/dL — ABNORMAL LOW (ref 8.9–10.3)
GFR, EST AFRICAN AMERICAN: 20 mL/min — AB (ref >60)
GFR, EST NON AFRICAN AMERICAN: 17 mL/min — AB (ref >60)
Glucose, Bld: 124 mg/dL — ABNORMAL HIGH (ref 70–99)
POTASSIUM: 4.2 mmol/L (ref 3.5–5.1)
Sodium: 137 mmol/L (ref 135–145)
Total Protein: 6.2 g/dL — ABNORMAL LOW (ref 6.5–8.1)

## 2018-06-18 LAB — FERRITIN: Ferritin: 147 ng/mL (ref 24–336)

## 2018-06-18 MED ORDER — RUXOLITINIB PHOSPHATE 5 MG PO TABS: 5.0000 mg | ORAL_TABLET | Freq: Every day | ORAL | 4 refills | Status: DC

## 2018-06-18 MED ORDER — RUXOLITINIB PHOSPHATE 5 MG PO TABS: 5.0000 mg | ORAL_TABLET | Freq: Two times a day (BID) | ORAL | 4 refills | Status: DC

## 2018-06-18 NOTE — Progress Notes (Signed)
James Arnold OFFICE PROGRESS NOTE  Patient Care Team: Valerie Roys, DO as PCP - General (Family Medicine) End, Harrell Gave, MD as PCP - Cardiology (Cardiology) Cammie Sickle, MD as Consulting Physician (Internal Medicine)  Cancer Staging No matching staging information was found for the patient.   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 James Arnold 2019- kidney Bx- FSG [ on Prednisone Dr.[name]];   # AUGUST 1st week 2019- START ARANESP 300 mcg q 4W  # hx of Lung nodules- resolved [[name]]/quit smoking.  DIAGNOSIS: PRIMARY MYELOFIBROSIS  RISK:LOW     ;GOALS: Control  CURRENT/MOST RECENT THERAPY : surveillance      Primary myelofibrosis ([place])      INTERVAL HISTORY:  James Arnold 62 y.o.  male pleasant patient above history of primary myelofibrosis Jak 2+ currently on surveillance; and also chronic kidney disease/FSG on prednisone is here for follow-up.  Patient continues to have worsening renal function.  Not tolerating prednisone or CellCept well.   Most recently noted to have hypokalemia for which he is on treatment as per his nephrologist.  Complains of continued fatigue.  No nausea no vomiting.  Weight stable.  Review of Systems  Constitutional: Positive for malaise/fatigue. Negative for chills, diaphoresis, fever and weight loss.  HENT: Negative for nosebleeds and sore throat.   Eyes: Negative for double vision.  Respiratory: Positive for shortness of breath. Negative for cough, hemoptysis, sputum production and wheezing.   Cardiovascular: Positive for leg swelling. Negative for chest pain, palpitations and orthopnea.   Gastrointestinal: Negative for abdominal pain, blood in stool, constipation, diarrhea, heartburn, melena, nausea and vomiting.  Genitourinary: Negative for dysuria, frequency and urgency.  Musculoskeletal: Negative for back pain and joint pain.  Skin: Negative.  Negative for itching and rash.  Neurological: Negative for dizziness, tingling, focal weakness, weakness and headaches.  Endo/Heme/Allergies: Does not bruise/bleed easily.  Psychiatric/Behavioral: Negative for depression. The patient is not nervous/anxious and does not have insomnia.       PAST MEDICAL HISTORY :  Past Medical History:  Diagnosis Date  . Anemia   . Benign hypertensive renal disease   . CAD (coronary artery disease)    a. 08/1996 s/p BMS to the mLAD ([facility]); b. 12/2017 MV: EF 46%, fixed apical defect w/ significant GI uptake artifact. No ischemia-> low risk.  . CKD (chronic kidney disease), stage IV ([place])   . Depression   . Diastolic dysfunction    a. 12/2017 Echo: EF 60-65%, no rwma, Gr1 DD, mild AI/MR. Nl RVSP.  [name] Kitchen Dyspnea    with exertion  . FSGS (focal segmental glomerulosclerosis)   . Hyperlipidemia 10/2002  . Hypertension   . Myelofibrosis ([place]) 2010  . Smoker   . Thrombocytopenia ([place])     PAST SURGICAL HISTORY :   Past Surgical History:  Procedure Laterality Date  . ANGIOPLASTY    . BONE MARROW ASPIRATION  03/2009  . CORONARY STENT PLACEMENT  08/1996    FAMILY HISTORY :   Family History  Problem Relation Age of Onset  . Stroke Mother        Low BP stroke  . Depression Father   . Depression Sister        Breast  .  Heart disease Unknown   . Breast cancer Unknown   . Lung cancer Unknown   . Ovarian cancer Unknown   . Stomach cancer Unknown     SOCIAL HISTORY:   Social History   Tobacco Use  . Smoking status: Former Smoker    Packs/day: 1.00    Types: Cigarettes    Last attempt to quit: 01/11/2018    Years since quitting: 0.4  . Smokeless tobacco: Never Used  Substance Use Topics   . Alcohol use: No    Alcohol/week: 0.0 standard drinks    Frequency: Never    Comment: patient states he has not consumed alcohol in the last month  . Drug use: No    ALLERGIES:  has No Known Allergies.  MEDICATIONS:  Current Outpatient Medications  Medication Sig Dispense Refill  . allopurinol (ZYLOPRIM) 100 MG tablet Take 0.5 tablets (50 mg total) by mouth daily. 45 tablet 3  . amLODipine (NORVASC) 10 MG tablet Take 1 tablet (10 mg total) by mouth daily. 90 tablet 1  . carvedilol (COREG) 12.5 MG tablet Take 1 tablet (12.5 mg total) by mouth 2 (two) times daily. 180 tablet 3  . citalopram (CELEXA) 20 MG tablet Take 1 tablet (20 mg total) by mouth daily. 90 tablet 1  . ferrous sulfate 325 (65 FE) MG tablet Take 1 tablet (325 mg total) by mouth daily with breakfast. 30 tablet 1  . furosemide (LASIX) 20 MG tablet Take 1 tablet (20 mg total) by mouth daily. 90 tablet 0  . losartan (COZAAR) 50 MG tablet Take 1 tablet (50 mg total) by mouth daily. 90 tablet 1  . sodium polystyrene (KAYEXALATE) powder 15 ml every 4-6 hours; until 2 loose stools. 454 g 0  . traMADol (ULTRAM) 50 MG tablet Take 1 tablet (50 mg total) by mouth every 8 (eight) hours as needed. 21 tablet 0  . ruxolitinib phosphate (JAKAFI) 5 MG tablet Take 1 tablet (5 mg total) by mouth daily. 30 tablet 4   No current facility-administered medications for this visit.     PHYSICAL EXAMINATION: ECOG PERFORMANCE STATUS: 1 - Symptomatic but completely ambulatory  BP (!) 159/73 (BP Location: Left Arm, Patient Position: Sitting)   Pulse 73   Temp (!) 97.3 F (36.3 C) (Tympanic)   Resp 16   Wt 176 lb (79.8 kg)   BMI 27.57 kg/m   Filed Weights   06/18/18 1006  Weight: 176 lb (79.8 kg)    Physical Exam  Constitutional: He is oriented to person, place, and time and well-developed, well-nourished, and in no distress.  Accompanied by his wife.  HENT:  Head: Normocephalic and atraumatic.  Mouth/Throat: Oropharynx is clear  and moist. No oropharyngeal exudate.  Eyes: Pupils are equal, round, and reactive to light.  Neck: Normal range of motion. Neck supple.  Cardiovascular: Normal rate and regular rhythm.  Pulmonary/Chest: No respiratory distress. He has no wheezes.  Decreased air entry bilaterally.  Abdominal: Soft. Bowel sounds are normal. He exhibits no distension and no mass. There is no tenderness. There is no rebound and no guarding.  Musculoskeletal: Normal range of motion. He exhibits no edema or tenderness.  Neurological: He is alert and oriented to person, place, and time.  Skin: Skin is warm.  Psychiatric: Affect normal.       LABORATORY DATA:  I have reviewed the data as listed    Component Value Date/Time   NA 137 06/18/2018 0932   NA 140 05/18/2018 0842   K 4.2  06/18/2018 0932   CL 112 (H) 06/18/2018 0932   CO2 18 (L) 06/18/2018 0932   GLUCOSE 124 (H) 06/18/2018 0932   BUN 47 (H) 06/18/2018 0932   BUN 61 (H) 05/18/2018 0842   CREATININE 3.50 (H) 06/18/2018 0932   CREATININE 1.12 09/30/2013 0953   CALCIUM 8.8 (L) 06/18/2018 0932   PROT 6.2 (L) 06/18/2018 0932   PROT 5.8 (L) 05/18/2018 0842   ALBUMIN 3.5 06/18/2018 0932   ALBUMIN 4.1 05/18/2018 0842   AST 15 06/18/2018 0932   ALT 11 06/18/2018 0932   ALKPHOS 46 06/18/2018 0932   BILITOT 0.6 06/18/2018 0932   BILITOT <0.2 05/18/2018 0842   GFRNONAA 17 (L) 06/18/2018 0932   GFRNONAA >60 09/30/2013 0953   GFRAA 20 (L) 06/18/2018 0932   GFRAA >60 09/30/2013 0953    No results found for: SPEP, UPEP  Lab Results  Component Value Date   WBC 6.4 06/18/2018   NEUTROABS 5.6 06/18/2018   HGB 10.4 (L) 06/18/2018   HCT 30.6 (L) 06/18/2018   MCV 85.1 06/18/2018   PLT 124 (L) 06/18/2018      Chemistry      Component Value Date/Time   NA 137 06/18/2018 0932   NA 140 05/18/2018 0842   K 4.2 06/18/2018 0932   CL 112 (H) 06/18/2018 0932   CO2 18 (L) 06/18/2018 0932   BUN 47 (H) 06/18/2018 0932   BUN 61 (H) 05/18/2018 0842    CREATININE 3.50 (H) 06/18/2018 0932   CREATININE 1.12 09/30/2013 0953      Component Value Date/Time   CALCIUM 8.8 (L) 06/18/2018 0932   ALKPHOS 46 06/18/2018 0932   AST 15 06/18/2018 0932   ALT 11 06/18/2018 0932   BILITOT 0.6 06/18/2018 0932   BILITOT <0.2 05/18/2018 0842       RADIOGRAPHIC STUDIES: I have personally reviewed the radiological images as listed and agreed with the findings in the report. No results found.   ASSESSMENT & PLAN:  Primary myelofibrosis ([place]) # Primary Myelofiborisis [Bone marrow 2010] jak-2 positive;  Currently on surveillance. Repeat BMBx [March 2019]-fibrosis; but no evidence of any blasts/or any features suggestive of transformation.   #However patient has declining renal function; biopsy of the kidney showed-FSG without any obvious reasons.  Patient had not responded tolerated well to steroids or CellCept.  Again discussed this morning with nephrology no clear cause of his renal dysfunction.  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  #I think it is reasonable to give a trial of Jakafi-5 mg once a day [GFR 17-18]; discussed the potential side effects including but not limited to tract infection shingles nausea vomiting diarrhea worsening anemia.  Patient understands treatments are palliative not curative.  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  Hemoglobin 10.  Hold off retacrit.   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  Not on dialysis.  See above  #Follow-up in 3 weeks; labs.  Hold tube.  Cc; Dr.[name]    Orders Placed This Encounter  Procedures  . CBC with Differential/Platelet    Standing Status:   Future    Standing Expiration Date:   06/19/2019  . Basic metabolic panel    Standing Status:   Future    Standing Expiration Date:   06/19/2019  . Sample to Blood  Bank    Standing Status:   Future    Standing Expiration Date:   06/19/2019  All questions were answered. The patient knows to call the clinic with any problems, questions or concerns.      Cammie Sickle, MD 06/18/2018 12:34 PM

## 2018-06-18 NOTE — Telephone Encounter (Signed)
Oral Oncology Patient Advocate Encounter  Received notification from [facility] that prior authorization for Shanon Brow is required.  PA submitted on CoverMyMeds Key [id number] Status is pending  Oral Oncology Clinic will continue to follow.  [place] Patient [place] Phone [phone] Fax [phone] 06/18/2018 1:33 PM

## 2018-06-18 NOTE — Telephone Encounter (Addendum)
Oral Oncology Patient Advocate Encounter   Was successful in securing patient a $7000 grant from [place] to provide copayment coverage for Jakafi.  This will keep the out of pocket expense at $0.    The billing information is as follows and has been shared with [place].   Member ID: [id number]  Group ID: [facility] RxBin: [id number] PCN: [id number] Eligibility dates:  06/18/18-06/19/19  James Arnold Patient [name] Phone [phone] Fax [phone] 06/18/2018 4:14 PM

## 2018-06-18 NOTE — Assessment & Plan Note (Addendum)
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 Repeat BMBx [March 2019]-fibrosis; but no evidence of any blasts/or any features suggestive of transformation.   #However patient has declining renal function; biopsy of the kidney showed-FSG without any obvious reasons.  Patient had not responded tolerated well to steroids or CellCept.  Again discussed this morning with nephrology no clear cause of his renal dysfunction.  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  #I think it is reasonable to give a trial of Jakafi-5 mg once a day [GFR 17-18]; discussed the potential side effects including but not limited to tract infection shingles nausea vomiting diarrhea worsening anemia.  Patient understands treatments are palliative not curative.  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  Hemoglobin 10.  Hold off retacrit.   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  Not on dialysis.  See above  #Follow-up in 3 weeks; labs.  Hold tube.  # 40 minutes face-to-face with the patient discussing the above plan of care; more than 50% of time spent on prognosis/ natural history; counseling and coordination.   Cc; Dr.[name]

## 2018-06-18 NOTE — Telephone Encounter (Signed)
Oral Oncology Patient Advocate Encounter   Was successful in securing patient an $88 grant from [facility] [place] [facility]) to provide copayment coverage for Jakafi.  This will keep the out of pocket expense at $0.     I have spoken with the patient.    The billing information is as follows and has been shared with [place].   Member ID: [id number] Group ID: [id number] RxBin: [id number] Dates of Eligibility: 03/20/18 through 06/18/2019  [name] Phone [phone] Fax [phone] 06/18/2018 4:11 PM

## 2018-06-21 ENCOUNTER — Telehealth: Payer: Self-pay | Admitting: Pharmacy Technician

## 2018-06-21 ENCOUNTER — Telehealth: Payer: Self-pay | Admitting: Pharmacist

## 2018-06-21 DIAGNOSIS — D471 Chronic myeloproliferative disease: Secondary | ICD-10-CM

## 2018-06-21 MED ORDER — RUXOLITINIB PHOSPHATE 10 MG PO TABS: 10.0000 mg | ORAL_TABLET | Freq: Two times a day (BID) | ORAL | 4 refills | Status: DC

## 2018-06-21 NOTE — Telephone Encounter (Signed)
Oral Chemotherapy Pharmacist Encounter  Spoke with Dr. Rogue Bussing and he would like for James Arnold to get started on his Jakafi when he receives it. He should receive his medication by Wednesday 06/23/18.  Patient Education I spoke with James Arnold for overview of new oral chemotherapy medication: Jakafi (ruxolitinib) for the treatment of primary myelofibrosis Jak 2+, planned duration until disease progression or unacceptable drug toxicity.   Counseled James Arnold on administration, dosing, side effects, monitoring, drug-food interactions, safe handling, storage, and disposal. Patient will take 1 tablet (10 mg total) by mouth 2 (two) times daily.  Side effects include but not limited to: decreased plt/hgb, elevated cholesterol.    Reviewed with the importance of keeping a medication schedule and plan for any missed doses.  James Arnold voiced understanding and appreciation. All questions answered. Medication handout placed in the mail.  Provided patient with Oral [facility] phone number. Patient knows to call the office with questions or concerns. Oral Chemotherapy Navigation Clinic will continue to follow.  Darl Pikes, PharmD, BCPS, [facility] Hematology/Oncology Clinical Pharmacist [facility] [phone]  06/21/2018 2:54 PM

## 2018-06-21 NOTE — Telephone Encounter (Signed)
Oral Oncology Pharmacist Encounter  Received new prescription for Jakafi (ruxolitinib) for the treatment of primary myelofibrosis Jak 2+, planned duration until disease progression or unacceptable drug toxicity.  CBC/CMP from 06/18/18 assessed, SCr elevated. Prescription dose and frequency assessed. Patient has renal impairment and the dose is appropriate considering his CrCl and plt count. Continue to monitor once Shanon Brow is started.  Current medication list in Epic reviewed, one DDIs with Jakafi identified: - Ruxolitinib may enhance the bradycardic effect of carvedilol. Monitor pt's HR.  Prescription has been e-scribed to the [facility] for benefits analysis and approval.   Oral Oncology Clinic will continue to follow for insurance authorization, copayment issues, initial counseling and start date.  Darl Pikes, PharmD, BCPS, [facility] Hematology/Oncology Clinical Pharmacist [facility] [place] [facility] [phone]  06/21/2018 10:58 AM

## 2018-06-21 NOTE — Telephone Encounter (Signed)
Oral Oncology Patient Advocate Encounter  Prior Authorization for James Arnold has been approved.    PA# [id number] Effective dates: 06/17/18 through 12/16/2018  Patients co-pay is $2429.63  Oral Oncology Clinic will continue to follow.   [place] Patient [place] Phone [phone] Fax [phone] 06/21/2018 8:18 AM

## 2018-06-21 NOTE — Telephone Encounter (Signed)
Oral Oncology Patient Advocate Encounter  Spoke with patients wife, Jackelyn Poling, over the phone about setting up the first fill of [place].  [facility] will mail his prescription on 10/1 for delivery 10/2.    I will follow up with the patient to make sure that he received the medication on 10/2.  [place] Patient [place] Phone [phone] Fax [phone] 06/21/2018 3:08 PM

## 2018-06-22 MED FILL — JAKAFI 10 MG TABLET: 10 | 30 days supply | Qty: 60 | Fill #0

## 2018-07-09 ENCOUNTER — Encounter: Payer: Self-pay | Admitting: Internal Medicine

## 2018-07-09 ENCOUNTER — Inpatient Hospital Stay: Payer: Medicare HMO | Attending: Internal Medicine

## 2018-07-09 ENCOUNTER — Inpatient Hospital Stay (HOSPITAL_BASED_OUTPATIENT_CLINIC_OR_DEPARTMENT_OTHER): Payer: Medicare HMO | Admitting: Internal Medicine

## 2018-07-09 VITALS — BP 138/82 | HR 65 | Temp 97.8°F | Resp 16 | Wt 179.0 lb

## 2018-07-09 DIAGNOSIS — R5383 Other fatigue: Secondary | ICD-10-CM | POA: Diagnosis not present

## 2018-07-09 DIAGNOSIS — Z800 Family history of malignant neoplasm of digestive organs: Secondary | ICD-10-CM | POA: Diagnosis not present

## 2018-07-09 DIAGNOSIS — D471 Chronic myeloproliferative disease: Secondary | ICD-10-CM | POA: Insufficient documentation

## 2018-07-09 DIAGNOSIS — F329 Major depressive disorder, single episode, unspecified: Secondary | ICD-10-CM | POA: Insufficient documentation

## 2018-07-09 DIAGNOSIS — E785 Hyperlipidemia, unspecified: Secondary | ICD-10-CM | POA: Diagnosis not present

## 2018-07-09 DIAGNOSIS — I129 Hypertensive chronic kidney disease with stage 1 through stage 4 chronic kidney disease, or unspecified chronic kidney disease: Secondary | ICD-10-CM | POA: Diagnosis not present

## 2018-07-09 DIAGNOSIS — Z8041 Family history of malignant neoplasm of ovary: Secondary | ICD-10-CM | POA: Diagnosis not present

## 2018-07-09 DIAGNOSIS — I2510 Atherosclerotic heart disease of native coronary artery without angina pectoris: Secondary | ICD-10-CM

## 2018-07-09 DIAGNOSIS — R918 Other nonspecific abnormal finding of lung field: Secondary | ICD-10-CM | POA: Insufficient documentation

## 2018-07-09 DIAGNOSIS — Z79899 Other long term (current) drug therapy: Secondary | ICD-10-CM | POA: Insufficient documentation

## 2018-07-09 DIAGNOSIS — M7989 Other specified soft tissue disorders: Secondary | ICD-10-CM | POA: Insufficient documentation

## 2018-07-09 DIAGNOSIS — Z801 Family history of malignant neoplasm of trachea, bronchus and lung: Secondary | ICD-10-CM

## 2018-07-09 DIAGNOSIS — N184 Chronic kidney disease, stage 4 (severe): Secondary | ICD-10-CM | POA: Insufficient documentation

## 2018-07-09 DIAGNOSIS — R531 Weakness: Secondary | ICD-10-CM

## 2018-07-09 DIAGNOSIS — D631 Anemia in chronic kidney disease: Secondary | ICD-10-CM | POA: Insufficient documentation

## 2018-07-09 DIAGNOSIS — I10 Essential (primary) hypertension: Secondary | ICD-10-CM | POA: Diagnosis not present

## 2018-07-09 DIAGNOSIS — Z87891 Personal history of nicotine dependence: Secondary | ICD-10-CM | POA: Diagnosis not present

## 2018-07-09 LAB — BASIC METABOLIC PANEL
ANION GAP: 9 (ref 5–15)
BUN: 62 mg/dL — AB (ref 8–23)
CO2: 20 mmol/L — ABNORMAL LOW (ref 22–32)
CREATININE: 3.68 mg/dL — AB (ref 0.61–1.24)
Calcium: 8.8 mg/dL — ABNORMAL LOW (ref 8.9–10.3)
Chloride: 108 mmol/L (ref 98–111)
GFR calc Af Amer: 19 mL/min — ABNORMAL LOW (ref >60)
GFR, EST NON AFRICAN AMERICAN: 16 mL/min — AB (ref >60)
GLUCOSE: 127 mg/dL — AB (ref 70–99)
Potassium: 5.1 mmol/L (ref 3.5–5.1)
Sodium: 137 mmol/L (ref 135–145)

## 2018-07-09 LAB — CBC WITH DIFFERENTIAL/PLATELET
Abs Immature Granulocytes: 0.04 10*3/uL (ref 0.00–0.07)
BASOS PCT: 0 %
Basophils Absolute: 0 10*3/uL (ref 0.0–0.1)
EOS ABS: 0 10*3/uL (ref 0.0–0.5)
EOS PCT: 0 %
HCT: 32.1 % — ABNORMAL LOW (ref 39.0–52.0)
Hemoglobin: 10.6 g/dL — ABNORMAL LOW (ref 13.0–17.0)
Immature Granulocytes: 1 %
Lymphocytes Relative: 10 %
Lymphs Abs: 0.4 10*3/uL — ABNORMAL LOW (ref 0.7–4.0)
MCH: 27.5 pg (ref 26.0–34.0)
MCHC: 33 g/dL (ref 30.0–36.0)
MCV: 83.2 fL (ref 80.0–100.0)
MONOS PCT: 3 %
Monocytes Absolute: 0.1 10*3/uL (ref 0.1–1.0)
Neutro Abs: 3.8 10*3/uL (ref 1.7–7.7)
Neutrophils Relative %: 86 %
PLATELETS: 101 10*3/uL — AB (ref 150–400)
RBC: 3.86 MIL/uL — ABNORMAL LOW (ref 4.22–5.81)
RDW: 15.7 % — ABNORMAL HIGH (ref 11.5–15.5)
WBC: 4.4 10*3/uL (ref 4.0–10.5)
nRBC: 0 % (ref 0.0–0.2)

## 2018-07-09 LAB — SAMPLE TO BLOOD BANK

## 2018-07-09 NOTE — Assessment & Plan Note (Addendum)
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 Renal involvement].    #However patient has declining renal function; biopsy of the kidney showed-FSG without any obvious reasons.  Patient had not responded tolerated well to higher doses steroids or CellCept.  Currently on prednisone 10 mg a day.  I left a message for [facility] nephro pathology discussed the case.  Question role for sulfur nuclear med scan.   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   # continue Jakafi- 10 mg BID  [GFR 17-18]; Discussed with pharmacy.   #Anemia secondary to chronic kidney disease; Hb 10.3; HOLDretacrit.   #CKD stage V [Dr. Latif] S/p Kidney Biopsy- FSG; clinically stable.  Not on dialysis.    # DISPOSITION:  # Follow-up in 3 weeks;MD labs-cbc/cmp/ldh; retacrit injection.  Hold tube.  Cc; Dr.[name]

## 2018-07-09 NOTE — Progress Notes (Signed)
[name] OFFICE PROGRESS NOTE  Patient Care Team: Valerie Roys, DO as PCP - General (Family Medicine) End, Harrell Gave, MD as PCP - Cardiology (Cardiology) Cammie Sickle, MD as Consulting Physician (Internal Medicine)  Cancer Staging No matching staging information was found for the patient.   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 James Arnold 2019- kidney Bx- FSG [ on Prednisone Dr.[name]];   # AUGUST 1st week 2019- START ARANESP 300 mcg q 4W  # October 2nd- jakafi 10 BID [? Renal involvement]  # hx of Lung nodules- resolved [[name]]/quit smoking.  DIAGNOSIS: PRIMARY MYELOFIBROSIS  RISK:LOW     ;GOALS: Control  CURRENT/MOST RECENT THERAPY : Jakafi       Primary myelofibrosis ([place])      INTERVAL HISTORY:  James Arnold 62 y.o.  male pleasant patient above history of primary myelofibrosis Jak 2+ currently on surveillance; and also chronic kidney disease/FSG on prednisone is here for follow-up.   Patient is currently started on Barnabas Lister if he approximately 2 weeks ago.  He denies improvement of fatigue.  Denies any improvement of his appetite.  He continues him on prednisone 10 mg a day.  Denies any worsening swelling in the legs.  Review of Systems  Constitutional: Positive for malaise/fatigue. Negative for chills, diaphoresis, fever and weight loss.  HENT: Negative for nosebleeds and sore throat.   Eyes: Negative for double vision.  Respiratory: Positive for shortness of breath. Negative for cough, hemoptysis, sputum production and wheezing.   Cardiovascular: Positive for leg swelling. Negative for chest pain, palpitations  and orthopnea.  Gastrointestinal: Negative for abdominal pain, blood in stool, constipation, diarrhea, heartburn, melena, nausea and vomiting.  Genitourinary: Negative for dysuria, frequency and urgency.  Musculoskeletal: Negative for back pain and joint pain.  Skin: Negative.  Negative for itching and rash.  Neurological: Negative for dizziness, tingling, focal weakness, weakness and headaches.  Endo/Heme/Allergies: Does not bruise/bleed easily.  Psychiatric/Behavioral: Negative for depression. The patient is not nervous/anxious and does not have insomnia.       PAST MEDICAL HISTORY :  Past Medical History:  Diagnosis Date  . Anemia   . Benign hypertensive renal disease   . CAD (coronary artery disease)    a. 08/1996 s/p BMS to the mLAD ([facility]); b. 12/2017 MV: EF 46%, fixed apical defect w/ significant GI uptake artifact. No ischemia-> low risk.  . CKD (chronic kidney disease), stage IV ([place])   . Depression   . Diastolic dysfunction    a. 12/2017 Echo: EF 60-65%, no rwma, Gr1 DD, mild AI/MR. Nl RVSP.  [name] Kitchen Dyspnea    with exertion  . FSGS (focal segmental glomerulosclerosis)   . Hyperlipidemia 10/2002  . Hypertension   . Myelofibrosis ([place]) 2010  . Smoker   . Thrombocytopenia ([place])     PAST SURGICAL HISTORY :   Past Surgical History:  Procedure Laterality Date  . ANGIOPLASTY    . BONE MARROW ASPIRATION  03/2009  . CORONARY STENT PLACEMENT  08/1996    FAMILY HISTORY :   Family History  Problem Relation Age of Onset  . Stroke Mother        Low BP stroke  . Depression Father   .  Depression Sister        Breast  . Heart disease Unknown   . Breast cancer Unknown   . Lung cancer Unknown   . Ovarian cancer Unknown   . Stomach cancer Unknown     SOCIAL HISTORY:   Social History   Tobacco Use  . Smoking status: Former Smoker    Packs/day: 1.00    Types: Cigarettes    Last attempt to quit: 01/11/2018    Years since quitting: 0.4  . Smokeless tobacco: Never Used   Substance Use Topics  . Alcohol use: No    Alcohol/week: 0.0 standard drinks    Frequency: Never    Comment: patient states he has not consumed alcohol in the last month  . Drug use: No    ALLERGIES:  has No Known Allergies.  MEDICATIONS:  Current Outpatient Medications  Medication Sig Dispense Refill  . allopurinol (ZYLOPRIM) 100 MG tablet Take 0.5 tablets (50 mg total) by mouth daily. 45 tablet 3  . amLODipine (NORVASC) 10 MG tablet Take 1 tablet (10 mg total) by mouth daily. 90 tablet 1  . carvedilol (COREG) 12.5 MG tablet Take 1 tablet (12.5 mg total) by mouth 2 (two) times daily. 180 tablet 3  . citalopram (CELEXA) 20 MG tablet Take 1 tablet (20 mg total) by mouth daily. 90 tablet 1  . ferrous sulfate 325 (65 FE) MG tablet Take 1 tablet (325 mg total) by mouth daily with breakfast. 30 tablet 1  . furosemide (LASIX) 20 MG tablet Take 1 tablet (20 mg total) by mouth daily. 90 tablet 0  . ruxolitinib phosphate (JAKAFI) 10 MG tablet Take 1 tablet (10 mg total) by mouth 2 (two) times daily. 60 tablet 4  . sodium polystyrene (KAYEXALATE) powder 15 ml every 4-6 hours; until 2 loose stools. 454 g 0  . traMADol (ULTRAM) 50 MG tablet Take 1 tablet (50 mg total) by mouth every 8 (eight) hours as needed. 21 tablet 0   No current facility-administered medications for this visit.     PHYSICAL EXAMINATION: ECOG PERFORMANCE STATUS: 1 - Symptomatic but completely ambulatory  BP 138/82 (BP Location: Left Arm, Patient Position: Sitting)   Pulse 65   Temp 97.8 F (36.6 C) (Tympanic)   Resp 16   Wt 179 lb (81.2 kg)   BMI 28.04 kg/m   Filed Weights   07/09/18 1014  Weight: 179 lb (81.2 kg)    Physical Exam  Constitutional: He is oriented to person, place, and time and well-developed, well-nourished, and in no distress.  Accompanied by his wife.  HENT:  Head: Normocephalic and atraumatic.  Mouth/Throat: Oropharynx is clear and moist. No oropharyngeal exudate.  Eyes: Pupils are equal,  round, and reactive to light.  Neck: Normal range of motion. Neck supple.  Cardiovascular: Normal rate and regular rhythm.  Pulmonary/Chest: No respiratory distress. He has no wheezes.  Decreased air entry bilaterally.  Abdominal: Soft. Bowel sounds are normal. He exhibits no distension and no mass. There is no tenderness. There is no rebound and no guarding.  Musculoskeletal: Normal range of motion. He exhibits no edema or tenderness.  Neurological: He is alert and oriented to person, place, and time.  Skin: Skin is warm.  Psychiatric: Affect normal.       LABORATORY DATA:  I have reviewed the data as listed    Component Value Date/Time   NA 137 07/09/2018 0928   NA 140 05/18/2018 0842   K 5.1 07/09/2018 0928   CL 108  07/09/2018 0928   CO2 20 (L) 07/09/2018 0928   GLUCOSE 127 (H) 07/09/2018 0928   BUN 62 (H) 07/09/2018 0928   BUN 61 (H) 05/18/2018 0842   CREATININE 3.68 (H) 07/09/2018 0928   CREATININE 1.12 09/30/2013 0953   CALCIUM 8.8 (L) 07/09/2018 0928   PROT 6.2 (L) 06/18/2018 0932   PROT 5.8 (L) 05/18/2018 0842   ALBUMIN 3.5 06/18/2018 0932   ALBUMIN 4.1 05/18/2018 0842   AST 15 06/18/2018 0932   ALT 11 06/18/2018 0932   ALKPHOS 46 06/18/2018 0932   BILITOT 0.6 06/18/2018 0932   BILITOT <0.2 05/18/2018 0842   GFRNONAA 16 (L) 07/09/2018 0928   GFRNONAA >60 09/30/2013 0953   GFRAA 19 (L) 07/09/2018 0928   GFRAA >60 09/30/2013 0953    No results found for: SPEP, UPEP  Lab Results  Component Value Date   WBC 4.4 07/09/2018   NEUTROABS 3.8 07/09/2018   HGB 10.6 (L) 07/09/2018   HCT 32.1 (L) 07/09/2018   MCV 83.2 07/09/2018   PLT 101 (L) 07/09/2018      Chemistry      Component Value Date/Time   NA 137 07/09/2018 0928   NA 140 05/18/2018 0842   K 5.1 07/09/2018 0928   CL 108 07/09/2018 0928   CO2 20 (L) 07/09/2018 0928   BUN 62 (H) 07/09/2018 0928   BUN 61 (H) 05/18/2018 0842   CREATININE 3.68 (H) 07/09/2018 0928   CREATININE 1.12 09/30/2013 0953       Component Value Date/Time   CALCIUM 8.8 (L) 07/09/2018 0928   ALKPHOS 46 06/18/2018 0932   AST 15 06/18/2018 0932   ALT 11 06/18/2018 0932   BILITOT 0.6 06/18/2018 0932   BILITOT <0.2 05/18/2018 0842       RADIOGRAPHIC STUDIES: I have personally reviewed the radiological images as listed and agreed with the findings in the report. No results found.   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 Renal involvement].    #However patient has declining renal function; biopsy of the kidney showed-FSG without any obvious reasons.  Patient had not responded tolerated well to higher doses steroids or CellCept.  Currently on prednisone 10 mg a day.  I left a message for [facility] nephro pathology discussed the case.  Question role for sulfur nuclear med scan.   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   # continue Jakafi- 10 mg BID  [GFR 17-18]; Discussed with pharmacy.   #Anemia secondary to chronic kidney disease; Hb 10.3; HOLDretacrit.   #CKD stage V [Dr. Latif] S/p Kidney Biopsy- FSG; clinically stable.  Not on dialysis.    # DISPOSITION:  # Follow-up in 3 weeks;MD labs-cbc/cmp/ldh; retacrit injection.  Hold tube.  Cc; Dr.[name]   No orders of the defined types were placed in this encounter.  All questions were answered. The patient knows to call the clinic with any problems, questions or concerns.     Cammie Sickle, MD 07/09/2018 1:10 PM

## 2018-07-13 ENCOUNTER — Other Ambulatory Visit: Payer: Self-pay

## 2018-07-13 ENCOUNTER — Encounter: Payer: Self-pay | Admitting: Family Medicine

## 2018-07-13 ENCOUNTER — Ambulatory Visit (INDEPENDENT_AMBULATORY_CARE_PROVIDER_SITE_OTHER): Payer: Medicare HMO | Admitting: Family Medicine

## 2018-07-13 VITALS — BP 138/75 | HR 79 | Temp 99.0°F | Ht 66.0 in | Wt 175.0 lb

## 2018-07-13 DIAGNOSIS — I129 Hypertensive chronic kidney disease with stage 1 through stage 4 chronic kidney disease, or unspecified chronic kidney disease: Secondary | ICD-10-CM

## 2018-07-13 DIAGNOSIS — J069 Acute upper respiratory infection, unspecified: Secondary | ICD-10-CM

## 2018-07-13 DIAGNOSIS — I2510 Atherosclerotic heart disease of native coronary artery without angina pectoris: Secondary | ICD-10-CM

## 2018-07-13 DIAGNOSIS — Z1211 Encounter for screening for malignant neoplasm of colon: Secondary | ICD-10-CM

## 2018-07-13 DIAGNOSIS — Z0000 Encounter for general adult medical examination without abnormal findings: Secondary | ICD-10-CM

## 2018-07-13 DIAGNOSIS — D631 Anemia in chronic kidney disease: Secondary | ICD-10-CM

## 2018-07-13 DIAGNOSIS — N183 Chronic kidney disease, stage 3 unspecified: Secondary | ICD-10-CM

## 2018-07-13 DIAGNOSIS — F339 Major depressive disorder, recurrent, unspecified: Secondary | ICD-10-CM | POA: Diagnosis not present

## 2018-07-13 DIAGNOSIS — Z125 Encounter for screening for malignant neoplasm of prostate: Secondary | ICD-10-CM

## 2018-07-13 DIAGNOSIS — E782 Mixed hyperlipidemia: Secondary | ICD-10-CM

## 2018-07-13 DIAGNOSIS — M109 Gout, unspecified: Secondary | ICD-10-CM | POA: Diagnosis not present

## 2018-07-13 LAB — UA/M W/RFLX CULTURE, ROUTINE
BILIRUBIN UA: NEGATIVE
Glucose, UA: NEGATIVE
KETONES UA: NEGATIVE
Leukocytes, UA: NEGATIVE
NITRITE UA: NEGATIVE
PH UA: 5 (ref 5.0–7.5)
Specific Gravity, UA: 1.02 (ref 1.005–1.030)
UUROB: 0.2 mg/dL (ref 0.2–1.0)

## 2018-07-13 LAB — MICROSCOPIC EXAMINATION
Bacteria, UA: NONE SEEN
RBC, UA: NONE SEEN /hpf (ref 0–2)

## 2018-07-13 MED ORDER — PREDNISONE 10 MG PO TABS: ORAL_TABLET | ORAL | 0 refills | Status: DC

## 2018-07-13 MED ORDER — ALLOPURINOL 100 MG PO TABS: 50.0000 mg | ORAL_TABLET | Freq: Every day | ORAL | 3 refills | Status: DC

## 2018-07-13 MED ORDER — PEG 3350-KCL-NABCB-NACL-NASULF 236 G PO SOLR: 4000.0000 mL | Freq: Once | ORAL | 0 refills | Status: AC

## 2018-07-13 MED ORDER — AZITHROMYCIN 250 MG PO TABS: ORAL_TABLET | ORAL | 0 refills | Status: DC

## 2018-07-13 MED ORDER — CITALOPRAM HYDROBROMIDE 20 MG PO TABS: 20.0000 mg | ORAL_TABLET | Freq: Every day | ORAL | 1 refills | Status: DC

## 2018-07-13 MED ORDER — FUROSEMIDE 20 MG PO TABS: 20.0000 mg | ORAL_TABLET | Freq: Every day | ORAL | 0 refills | Status: DC

## 2018-07-13 MED ORDER — ALBUTEROL SULFATE (2.5 MG/3ML) 0.083% IN NEBU
2.5000 mg | INHALATION_SOLUTION | Freq: Once | RESPIRATORY_TRACT | Status: AC
  Administered 2018-07-13: 2.5 mg via RESPIRATORY_TRACT

## 2018-07-13 MED ORDER — AMLODIPINE BESYLATE 10 MG PO TABS: 10.0000 mg | ORAL_TABLET | Freq: Every day | ORAL | 1 refills | Status: DC

## 2018-07-13 NOTE — Assessment & Plan Note (Signed)
Continue to follow with oncology. Call with any concerns. Rechecking levels today.

## 2018-07-13 NOTE — Assessment & Plan Note (Signed)
Rechecking levels today. Await results. Call with any concerns.  

## 2018-07-13 NOTE — Patient Instructions (Addendum)
Preventative Services:  Health Risk Assessment and Personalized Prevention Plan: Done today Bone Mass Measurements: N/A CVD Screening: Done today Colon Cancer Screening: Ordered today Depression Screening: Done today Diabetes Screening: Done today Glaucoma Screening: See your eye doctor Hepatitis B vaccine: N/A Hepatitis C screening: Up to date HIV Screening: up to date Flu Vaccine: Declined Lung cancer Screening: Declined Obesity Screening: Done today Pneumonia Vaccines (2): Up to date STI Screening: N/A PSA screening: Done today   Health Maintenance, Male A healthy lifestyle and preventive care is important for your health and wellness. Ask your health care provider about what schedule of regular examinations is right for you. What should I know about weight and diet? Eat a Healthy Diet  Eat plenty of vegetables, fruits, whole grains, low-fat dairy products, and lean protein.  Do not eat a lot of foods high in solid fats, added sugars, or salt.  Maintain a Healthy Weight Regular exercise can help you achieve or maintain a healthy weight. You should:  Do at least 150 minutes of exercise each week. The exercise should increase your heart rate and make you sweat (moderate-intensity exercise).  Do strength-training exercises at least twice a week.  Watch Your Levels of Cholesterol and Blood Lipids  Have your blood tested for lipids and cholesterol every 5 years starting at 62 years of age. If you are at high risk for heart disease, you should start having your blood tested when you are 62 years old. You may need to have your cholesterol levels checked more often if: ? Your lipid or cholesterol levels are high. ? You are older than 62 years of age. ? You are at high risk for heart disease.  What should I know about cancer screening? Many types of cancers can be detected early and may often be prevented. Lung Cancer  You should be screened every year for lung cancer  if: ? You are a current smoker who has smoked for at least 30 years. ? You are a former smoker who has quit within the past 15 years.  Talk to your health care provider about your screening options, when you should start screening, and how often you should be screened.  Colorectal Cancer  Routine colorectal cancer screening usually begins at 62 years of age and should be repeated every 5-10 years until you are 62 years old. You may need to be screened more often if early forms of precancerous polyps or small growths are found. Your health care provider may recommend screening at an earlier age if you have risk factors for colon cancer.  Your health care provider may recommend using home test kits to check for hidden blood in the stool.  A small camera at the end of a tube can be used to examine your colon (sigmoidoscopy or colonoscopy). This checks for the earliest forms of colorectal cancer.  Prostate and Testicular Cancer  Depending on your age and overall health, your health care provider may do certain tests to screen for prostate and testicular cancer.  Talk to your health care provider about any symptoms or concerns you have about testicular or prostate cancer.  Skin Cancer  Check your skin from head to toe regularly.  Tell your health care provider about any new moles or changes in moles, especially if: ? There is a change in a mole's size, shape, or color. ? You have a mole that is larger than a pencil eraser.  Always use sunscreen. Apply sunscreen liberally and repeat throughout the  day.  Protect yourself by wearing long sleeves, pants, a wide-brimmed hat, and sunglasses when outside.  What should I know about heart disease, diabetes, and high blood pressure?  If you are 88-17 years of age, have your blood pressure checked every 3-5 years. If you are 63 years of age or older, have your blood pressure checked every year. You should have your blood pressure measured  twice-once when you are at a hospital or clinic, and once when you are not at a hospital or clinic. Record the average of the two measurements. To check your blood pressure when you are not at a hospital or clinic, you can use: ? An automated blood pressure machine at a pharmacy. ? A home blood pressure monitor.  Talk to your health care provider about your target blood pressure.  If you are between 4-60 years old, ask your health care provider if you should take aspirin to prevent heart disease.  Have regular diabetes screenings by checking your fasting blood sugar level. ? If you are at a normal weight and have a low risk for diabetes, have this test once every three years after the age of 40. ? If you are overweight and have a high risk for diabetes, consider being tested at a younger age or more often.  A one-time screening for abdominal aortic aneurysm (AAA) by ultrasound is recommended for men aged 55-75 years who are current or former smokers. What should I know about preventing infection? Hepatitis B If you have a higher risk for hepatitis B, you should be screened for this virus. Talk with your health care provider to find out if you are at risk for hepatitis B infection. Hepatitis C Blood testing is recommended for:  Everyone born from [birth year] through 1965.  Anyone with known risk factors for hepatitis C.  Sexually Transmitted Diseases (STDs)  You should be screened each year for STDs including gonorrhea and chlamydia if: ? You are sexually active and are younger than 62 years of age. ? You are older than 62 years of age and your health care provider tells you that you are at risk for this type of infection. ? Your sexual activity has changed since you were last screened and you are at an increased risk for chlamydia or gonorrhea. Ask your health care provider if you are at risk.  Talk with your health care provider about whether you are at high risk of being infected with HIV.  Your health care provider may recommend a prescription medicine to help prevent HIV infection.  What else can I do?  Schedule regular health, dental, and eye exams.  Stay current with your vaccines (immunizations).  Do not use any tobacco products, such as cigarettes, chewing tobacco, and e-cigarettes. If you need help quitting, ask your health care provider.  Limit alcohol intake to no more than 2 drinks per day. One drink equals 12 ounces of beer, 5 ounces of wine, or 1 ounces of hard liquor.  Do not use street drugs.  Do not share needles.  Ask your health care provider for help if you need support or information about quitting drugs.  Tell your health care provider if you often feel depressed.  Tell your health care provider if you have ever been abused or do not feel safe at home. This information is not intended to replace advice given to you by your health care provider. Make sure you discuss any questions you have with your health care provider. Document Released:  03/06/2008 Document Revised: 05/07/2016 Document Reviewed: 06/12/2015 Elsevier Interactive Patient Education  [employer].

## 2018-07-13 NOTE — Assessment & Plan Note (Signed)
Under good control on current regimen. Continue current regimen. Continue to monitor. Call with any concerns. Refills given.   

## 2018-07-13 NOTE — Assessment & Plan Note (Signed)
Stable. No pain. Will keep BP and cholesterol under good control. Continue to monitor.

## 2018-07-13 NOTE — Progress Notes (Signed)
BP 138/75   Pulse 79   Temp 99 F (37.2 C) (Oral)   Ht 5\' 6"  (1.676 m)   Wt 175 lb (79.4 kg)   SpO2 90%   BMI 28.25 kg/m    Subjective:    [name], male    DOB: 09-Jan-1956, 62 y.o.   MRN: [id number]  HPI: James Arnold is a 62 y.o. male presenting on 07/13/2018 for comprehensive medical examination. Current medical complaints include:  UPPER RESPIRATORY TRACT INFECTION Duration: couple of days Worst symptom: cough Fever: yes Cough: yes Shortness of breath: yes Wheezing: yes Chest pain: yes, with cough Chest tightness: yes Chest congestion: no Nasal congestion: yes Runny nose: yes Post nasal drip: yes Sneezing: yes Sore throat: no Swollen glands: no Sinus pressure: no Headache: yes Face pain: no Toothache: no Ear pain: no  Ear pressure: no  Eyes red/itching:no Eye drainage/crusting: no  Vomiting: no Rash: no Fatigue: yes Sick contacts: no Strep contacts: no  Context: stable Recurrent sinusitis: no Relief with OTC cold/cough medications: no  Treatments attempted: cold/sinus   HYPERTENSION / HYPERLIPIDEMIA Satisfied with current treatment? yes Duration of hypertension: chronic BP monitoring frequency: not checking BP medication side effects: no Past BP meds: amlopurinol, carvedilol Duration of hyperlipidemia: chronic Cholesterol medication side effects: Not on anything Cholesterol supplements: none Past cholesterol medications: none Medication compliance: good compliance Aspirin: no Recent stressors: no Recurrent headaches: no Visual changes: no Palpitations: no Dyspnea: no- only with cold Chest pain: no-  only with cold Lower extremity edema: no Dizzy/lightheaded: no  Has been doing well with his gout- no flares.  DEPRESSION Mood status: controlled Satisfied with current treatment?: yes Symptom severity: mild  Duration of current treatment : chronic Side effects: yes Medication compliance: excellent  compliance Psychotherapy/counseling: no  Previous psychiatric medications: celexa Depressed mood: no Anxious mood: no Anhedonia: no Significant weight loss or gain: no Insomnia: no  Fatigue: yes Feelings of worthlessness or guilt: no Impaired concentration/indecisiveness: no Suicidal ideations: no Hopelessness: no Crying spells: no Depression screen [facility] 2/9 07/13/2018 02/19/2018 01/07/2018 08/27/2017 03/05/2017  Decreased Interest 1 1 1 1  0  Down, Depressed, Hopeless 0 0 1 1 0  PHQ - 2 Score 1 1 2 2  0  Altered sleeping 1 2 2 2  -  Tired, decreased energy 2 2 1 2  -  Change in appetite 0 0 0 2 -  Feeling bad or failure about yourself  0 0 0 0 -  Trouble concentrating 0 0 0 0 -  Moving slowly or fidgety/restless 0 0 1 0 -  Suicidal thoughts - 0 0 0 -  PHQ-9 Score 4 5 6 8  -  Difficult doing work/chores Not difficult at all Not difficult at all Somewhat difficult Not difficult at all -   He currently lives with: wife Interim Problems from his last visit: no  Functional Status Survey: Is the patient deaf or have difficulty hearing?: No Does the patient have difficulty seeing, even when wearing glasses/contacts?: No Does the patient have difficulty concentrating, remembering, or making decisions?: No Does the patient have difficulty walking or climbing stairs?: No Does the patient have difficulty dressing or bathing?: No Does the patient have difficulty doing errands alone such as visiting a doctor's office or shopping?: No  FALL RISK: Fall Risk  07/13/2018 05/10/2018 02/19/2018 08/27/2017 03/05/2017  Falls in the past year? No No No No No    Depression Screen Depression screen [facility] 2/9 07/13/2018 02/19/2018 01/07/2018 08/27/2017 03/05/2017  Decreased  Interest 1 1 1 1  0  Down, Depressed, Hopeless 0 0 1 1 0  PHQ - 2 Score 1 1 2 2  0  Altered sleeping 1 2 2 2  -  Tired, decreased energy 2 2 1 2  -  Change in appetite 0 0 0 2 -  Feeling bad or failure about yourself  0 0 0 0 -  Trouble  concentrating 0 0 0 0 -  Moving slowly or fidgety/restless 0 0 1 0 -  Suicidal thoughts - 0 0 0 -  PHQ-9 Score 4 5 6 8  -  Difficult doing work/chores Not difficult at all Not difficult at all Somewhat difficult Not difficult at all -    Advanced Directives <no information>  Past Medical History:  Past Medical History:  Diagnosis Date  . Acute renal failure (ARF) ([place]) 05/15/2017  . Anemia   . Benign hypertensive renal disease   . CAD (coronary artery disease)    a. 08/1996 s/p BMS to the mLAD ([facility]); b. 12/2017 MV: EF 46%, fixed apical defect w/ significant GI uptake artifact. No ischemia-> low risk.  . CKD (chronic kidney disease), stage IV ([place])   . Depression   . Diastolic dysfunction    a. 12/2017 Echo: EF 60-65%, no rwma, Gr1 DD, mild AI/MR. Nl RVSP.  [name] Kitchen Dyspnea    with exertion  . FSGS (focal segmental glomerulosclerosis)   . Hyperlipidemia 10/2002  . Hypertension   . Myelofibrosis ([place]) 2010  . Smoker   . Thrombocytopenia [facility])     Surgical History:  Past Surgical History:  Procedure Laterality Date  . ANGIOPLASTY    . BONE MARROW ASPIRATION  03/2009  . CORONARY STENT PLACEMENT  08/1996    Medications:  Current Outpatient Medications on File Prior to Visit  Medication Sig  . ruxolitinib phosphate (JAKAFI) 10 MG tablet Take 1 tablet (10 mg total) by mouth 2 (two) times daily.  . sodium polystyrene (KAYEXALATE) powder 15 ml every 4-6 hours; until 2 loose stools.  . traMADol (ULTRAM) 50 MG tablet Take 1 tablet (50 mg total) by mouth every 8 (eight) hours as needed.  . carvedilol (COREG) 12.5 MG tablet Take 1 tablet (12.5 mg total) by mouth 2 (two) times daily.  . ferrous sulfate 325 (65 FE) MG tablet Take 1 tablet (325 mg total) by mouth daily with breakfast.   No current facility-administered medications on file prior to visit.     Allergies:  No Known Allergies  Social History:  Social History   Socioeconomic History  . Marital status: Married    Spouse  name: Not on file  . Number of children: Not on file  . Years of education: Not on file  . Highest education level: Not on file  Occupational History  . Not on file  Social Needs  . Financial resource strain: Patient refused  . Food insecurity:    Worry: Patient refused    Inability: Patient refused  . Transportation needs:    Medical: Patient refused    Non-medical: Patient refused  Tobacco Use  . Smoking status: Former Smoker    Packs/day: 1.00    Types: Cigarettes    Last attempt to quit: 01/11/2018    Years since quitting: 0.5  . Smokeless tobacco: Never Used  Substance and Sexual Activity  . Alcohol use: No    Alcohol/week: 0.0 standard drinks    Frequency: Never    Comment: patient states he has not consumed alcohol in the last month  .  Drug use: No  . Sexual activity: Yes  Lifestyle  . Physical activity:    Days per week: Patient refused    Minutes per session: Patient refused  . Stress: Patient refused  Relationships  . Social connections:    Talks on phone: Patient refused    Gets together: Patient refused    Attends religious service: Patient refused    Active member of club or organization: Patient refused    Attends meetings of clubs or organizations: Patient refused    Relationship status: Married  . Intimate partner violence:    Fear of current or ex partner: Patient refused    Emotionally abused: Patient refused    Physically abused: Patient refused    Forced sexual activity: Patient refused  Other Topics Concern  . Not on file  Social History Narrative  . Not on file   Social History   Tobacco Use  Smoking Status Former Smoker  . Packs/day: 1.00  . Types: Cigarettes  . Last attempt to quit: 01/11/2018  . Years since quitting: 0.5  Smokeless Tobacco Never Used   Social History   Substance and Sexual Activity  Alcohol Use No  . Alcohol/week: 0.0 standard drinks  . Frequency: Never   Comment: patient states he has not consumed alcohol in  the last month    Family History:  Family History  Problem Relation Age of Onset  . Stroke Mother        Low BP stroke  . Depression Father   . Depression Sister        Breast  . Heart disease Unknown   . Breast cancer Unknown   . Lung cancer Unknown   . Ovarian cancer Unknown   . Stomach cancer Unknown     Past medical history, surgical history, medications, allergies, family history and social history reviewed with patient today and changes made to appropriate areas of the chart.   Review of Systems  Constitutional: Positive for chills, fever and malaise/fatigue. Negative for diaphoresis and weight loss.  HENT: Positive for congestion. Negative for ear discharge, ear pain, hearing loss, nosebleeds, sinus pain, sore throat and tinnitus.   Eyes: Negative.   Respiratory: Positive for cough and shortness of breath. Negative for hemoptysis, sputum production, wheezing and stridor.   Cardiovascular: Negative.   Gastrointestinal: Positive for constipation. Negative for abdominal pain, blood in stool, diarrhea, heartburn, melena, nausea and vomiting.  Genitourinary: Negative.   Musculoskeletal: Negative.   Skin: Negative.   Neurological: Negative.   Endo/Heme/Allergies: Negative for environmental allergies and polydipsia. Bruises/bleeds easily.  Psychiatric/Behavioral: Negative.     All other ROS negative except what is listed above and in the HPI.      Objective:    BP 138/75   Pulse 79   Temp 99 F (37.2 C) (Oral)   Ht 5\' 6"  (1.676 m)   Wt 175 lb (79.4 kg)   SpO2 90%   BMI 28.25 kg/m   Wt Readings from Last 3 Encounters:  07/13/18 175 lb (79.4 kg)  07/09/18 179 lb (81.2 kg)  06/18/18 176 lb (79.8 kg)    Physical Exam  Constitutional: He is oriented to person, place, and time. He appears well-developed and well-nourished. No distress.  HENT:  Head: Normocephalic and atraumatic.  Right Ear: Hearing and external ear normal.  Left Ear: Hearing and external ear  normal.  Nose: Nose normal.  Mouth/Throat: Oropharynx is clear and moist. No oropharyngeal exudate.  Eyes: Pupils are equal, round, and reactive to  light. Conjunctivae, EOM and lids are normal. Right eye exhibits no discharge. Left eye exhibits no discharge. No scleral icterus.  Neck: Normal range of motion. Neck supple. No JVD present. No tracheal deviation present. No thyromegaly present.  Cardiovascular: Normal rate, regular rhythm, normal heart sounds and intact distal pulses. Exam reveals no gallop and no friction rub.  No murmur heard. Pulmonary/Chest: Effort normal. No stridor. No respiratory distress. He has wheezes. He has no rales. He exhibits no tenderness.  Abdominal: Soft. Bowel sounds are normal. He exhibits no distension and no mass. There is no tenderness. There is no rebound and no guarding. No hernia.  Genitourinary:  Genitourinary Comments: Genital exam deferred with shared decision making  Musculoskeletal: Normal range of motion. He exhibits no edema, tenderness or deformity.  Lymphadenopathy:    He has no cervical adenopathy.  Neurological: He is alert and oriented to person, place, and time. He displays normal reflexes. No cranial nerve deficit or sensory deficit. He exhibits normal muscle tone. Coordination normal.  Skin: Skin is warm, dry and intact. Capillary refill takes less than 2 seconds. No rash noted. He is not diaphoretic. No erythema. No pallor.  Psychiatric: He has a normal mood and affect. His speech is normal and behavior is normal. Judgment and thought content normal. Cognition and memory are normal.  Nursing note and vitals reviewed.   6CIT Screen 07/13/2018 02/18/2017  What Year? 0 points 0 points  What month? 0 points 0 points  What time? 0 points 0 points  Count back from 20 0 points 0 points  Months in reverse 0 points 0 points  Repeat phrase 0 points 2 points  Total Score 0 2    Results for orders placed or performed in visit on 23/55/73  Basic  metabolic panel  Result Value Ref Range   Sodium 137 135 - 145 mmol/L   Potassium 5.1 3.5 - 5.1 mmol/L   Chloride 108 98 - 111 mmol/L   CO2 20 (L) 22 - 32 mmol/L   Glucose, Bld 127 (H) 70 - 99 mg/dL   BUN 62 (H) 8 - 23 mg/dL   Creatinine, Ser 3.68 (H) 0.61 - 1.24 mg/dL   Calcium 8.8 (L) 8.9 - 10.3 mg/dL   GFR calc non Af Amer 16 (L) >60 mL/min   GFR calc Af Amer 19 (L) >60 mL/min   Anion gap 9 5 - 15  CBC with Differential/Platelet  Result Value Ref Range   WBC 4.4 4.0 - 10.5 K/uL   RBC 3.86 (L) 4.22 - 5.81 MIL/uL   Hemoglobin 10.6 (L) 13.0 - 17.0 g/dL   HCT 32.1 (L) 39.0 - 52.0 %   MCV 83.2 80.0 - 100.0 fL   MCH 27.5 26.0 - 34.0 pg   MCHC 33.0 30.0 - 36.0 g/dL   RDW 15.7 (H) 11.5 - 15.5 %   Platelets 101 (L) 150 - 400 K/uL   nRBC 0.0 0.0 - 0.2 %   Neutrophils Relative % 86 %   Neutro Abs 3.8 1.7 - 7.7 K/uL   Lymphocytes Relative 10 %   Lymphs Abs 0.4 (L) 0.7 - 4.0 K/uL   Monocytes Relative 3 %   Monocytes Absolute 0.1 0.1 - 1.0 K/uL   Eosinophils Relative 0 %   Eosinophils Absolute 0.0 0.0 - 0.5 K/uL   Basophils Relative 0 %   Basophils Absolute 0.0 0.0 - 0.1 K/uL   Immature Granulocytes 1 %   Abs Immature Granulocytes 0.04 0.00 - 0.07 K/uL  Sample to Blood Bank  Result Value Ref Range   Blood Bank Specimen SAMPLE AVAILABLE FOR TESTING    Sample Expiration      07/12/2018 Performed at [facility], [address] [place],  [address]       Assessment & Plan:   Problem List Items Addressed This Visit      Cardiovascular and Mediastinum   CAD (coronary artery disease)    Stable. No pain. Will keep BP and cholesterol under good control. Continue to monitor.       Relevant Medications   amLODipine (NORVASC) 10 MG tablet   furosemide (LASIX) 20 MG tablet   Other Relevant Orders   CBC with Differential/Platelet   Comprehensive metabolic panel   TSH   UA/M w/rflx Culture, Routine     Genitourinary   Benign hypertensive renal disease     Under good control on current regimen. Continue current regimen. Continue to monitor. Call with any concerns. Refills given.        Relevant Medications   furosemide (LASIX) 20 MG tablet   Other Relevant Orders   CBC with Differential/Platelet   Comprehensive metabolic panel   TSH   UA/M w/rflx Culture, Routine   CKD (chronic kidney disease), stage III (HCC)    Rechecking levels today. Await results. Call with any concerns.       Relevant Orders   CBC with Differential/Platelet   Comprehensive metabolic panel   TSH   UA/M w/rflx Culture, Routine   Anemia of chronic kidney failure, stage 3 (moderate) (HCC)    Continue to follow with oncology. Call with any concerns. Rechecking levels today.       Relevant Orders   CBC with Differential/Platelet   Comprehensive metabolic panel   TSH   UA/M w/rflx Culture, Routine     Other   Hyperlipidemia    Rechecking levels today. Await results. Call with any concerns.       Relevant Medications   amLODipine (NORVASC) 10 MG tablet   furosemide (LASIX) 20 MG tablet   Other Relevant Orders   CBC with Differential/Platelet   Comprehensive metabolic panel   Lipid Panel w/o Chol/HDL Ratio   TSH   UA/M w/rflx Culture, Routine   Depression    Under good control on current regimen. Continue current regimen. Continue to monitor. Call with any concerns. Refills given.        Relevant Medications   citalopram (CELEXA) 20 MG tablet   Other Relevant Orders   CBC with Differential/Platelet   Comprehensive metabolic panel   TSH   UA/M w/rflx Culture, Routine   Gout    Under good control on current regimen. Continue current regimen. Continue to monitor. Call with any concerns. Refills given.        Relevant Orders   CBC with Differential/Platelet   Comprehensive metabolic panel   TSH   UA/M w/rflx Culture, Routine   Uric acid    Other Visit Diagnoses    Medicare annual wellness visit, subsequent    -  Primary   Preventative care  discussed today as below.    Routine general medical examination at a health care facility       Vaccines up to date. Screening labs checked today. Colonoscopy up to date. Continue diet and exercise. Call with any concerns.    Colon cancer screening       Referral to GI made today.   Relevant Orders   Ambulatory referral to Gastroenterology   CBC with  Differential/Platelet   Comprehensive metabolic panel   TSH   UA/M w/rflx Culture, Routine   Screening for prostate cancer       PSA drawn today.   Relevant Orders   PSA   Viral upper respiratory tract infection       Better following neb. Wil treat with prednisone and azithromycin. Call with any concerns.    Relevant Medications   albuterol (PROVENTIL) (2.5 MG/3ML) 0.083% nebulizer solution 2.5 mg (Completed)   azithromycin (ZITHROMAX) 250 MG tablet       Preventative Services:  Health Risk Assessment and Personalized Prevention Plan: Done today Bone Mass Measurements: N/A CVD Screening: Done today Colon Cancer Screening: Ordered today Depression Screening: Done today Diabetes Screening: Done today Glaucoma Screening: See your eye doctor Hepatitis B vaccine: N/A Hepatitis C screening: Up to date HIV Screening: up to date Flu Vaccine: Declined Lung cancer Screening: Declined Obesity Screening: Done today Pneumonia Vaccines (2): Up to date STI Screening: N/A PSA screening: Done today  LABORATORY TESTING:  Health maintenance labs ordered today as discussed above.   The natural history of prostate cancer and ongoing controversy regarding screening and potential treatment outcomes of prostate cancer has been discussed with the patient. The meaning of a false positive PSA and a false negative PSA has been discussed. He indicates understanding of the limitations of this screening test and wishes to proceed with screening PSA testing.   IMMUNIZATIONS:   - Tdap: Tetanus vaccination status reviewed: last tetanus booster within 10  years. - Influenza: Up to date - Pneumovax: Up to date  SCREENING: - Colonoscopy: Ordered today  Discussed with patient purpose of the colonoscopy is to detect colon cancer at curable precancerous or early stages   PATIENT COUNSELING:    Sexuality: Discussed sexually transmitted diseases, partner selection, use of condoms, avoidance of unintended pregnancy  and contraceptive alternatives.   Advised to avoid cigarette smoking.  I discussed with the patient that most people either abstain from alcohol or drink within safe limits (<=14/week and <=4 drinks/occasion for males, <=7/weeks and <= 3 drinks/occasion for females) and that the risk for alcohol disorders and other health effects rises proportionally with the number of drinks per week and how often a drinker exceeds daily limits.  Discussed cessation/primary prevention of drug use and availability of treatment for abuse.   Diet: Encouraged to adjust caloric intake to maintain  or achieve ideal body weight, to reduce intake of dietary saturated fat and total fat, to limit sodium intake by avoiding high sodium foods and not adding table salt, and to maintain adequate dietary potassium and calcium preferably from fresh fruits, vegetables, and low-fat dairy products.    stressed the importance of regular exercise  Injury prevention: Discussed safety belts, safety helmets, smoke detector, smoking near bedding or upholstery.   Dental health: Discussed importance of regular tooth brushing, flossing, and dental visits.   Follow up plan: NEXT PREVENTATIVE PHYSICAL DUE IN 1 YEAR. Return in about 6 months (around 01/12/2019) for 6 month follow up.

## 2018-07-14 LAB — CBC WITH DIFFERENTIAL/PLATELET
Basophils Absolute: 0 10*3/uL (ref 0.0–0.2)
Basos: 0 %
EOS (ABSOLUTE): 0 10*3/uL (ref 0.0–0.4)
EOS: 0 %
Hematocrit: 30.6 % — ABNORMAL LOW (ref 37.5–51.0)
Hemoglobin: 10.2 g/dL — ABNORMAL LOW (ref 13.0–17.7)
IMMATURE GRANS (ABS): 0 10*3/uL (ref 0.0–0.1)
IMMATURE GRANULOCYTES: 0 %
LYMPHS: 13 %
Lymphocytes Absolute: 0.4 10*3/uL — ABNORMAL LOW (ref 0.7–3.1)
MCH: 28.5 pg (ref 26.6–33.0)
MCHC: 33.3 g/dL (ref 31.5–35.7)
MCV: 86 fL (ref 79–97)
MONOCYTES: 4 %
MONOS ABS: 0.1 10*3/uL (ref 0.1–0.9)
NEUTROS PCT: 83 %
Neutrophils Absolute: 2.9 10*3/uL (ref 1.4–7.0)
Platelets: 108 10*3/uL — ABNORMAL LOW (ref 150–450)
RBC: 3.58 x10E6/uL — AB (ref 4.14–5.80)
RDW: 16.4 % — ABNORMAL HIGH (ref 12.3–15.4)
WBC: 3.5 10*3/uL (ref 3.4–10.8)

## 2018-07-14 LAB — COMPREHENSIVE METABOLIC PANEL
ALBUMIN: 4.1 g/dL (ref 3.6–4.8)
ALK PHOS: 51 IU/L (ref 39–117)
ALT: 9 IU/L (ref 0–44)
AST: 14 IU/L (ref 0–40)
Albumin/Globulin Ratio: 2.3 — ABNORMAL HIGH (ref 1.2–2.2)
BUN / CREAT RATIO: 15 (ref 10–24)
BUN: 57 mg/dL — AB (ref 8–27)
Bilirubin Total: 0.3 mg/dL (ref 0.0–1.2)
CO2: 15 mmol/L — AB (ref 20–29)
CREATININE: 3.71 mg/dL — AB (ref 0.76–1.27)
Calcium: 8.7 mg/dL (ref 8.6–10.2)
Chloride: 104 mmol/L (ref 96–106)
GFR calc Af Amer: 19 mL/min/{1.73_m2} — ABNORMAL LOW (ref >59)
GFR calc non Af Amer: 16 mL/min/{1.73_m2} — ABNORMAL LOW (ref >59)
GLUCOSE: 103 mg/dL — AB (ref 65–99)
Globulin, Total: 1.8 g/dL (ref 1.5–4.5)
Potassium: 5 mmol/L (ref 3.5–5.2)
Sodium: 134 mmol/L (ref 134–144)
Total Protein: 5.9 g/dL — ABNORMAL LOW (ref 6.0–8.5)

## 2018-07-14 LAB — URIC ACID: Uric Acid: 8.8 mg/dL — ABNORMAL HIGH (ref 3.7–8.6)

## 2018-07-14 LAB — LIPID PANEL W/O CHOL/HDL RATIO
CHOLESTEROL TOTAL: 298 mg/dL — AB (ref 100–199)
HDL: 31 mg/dL — ABNORMAL LOW (ref >39)
LDL CALC: 208 mg/dL — AB (ref 0–99)
TRIGLYCERIDES: 294 mg/dL — AB (ref 0–149)
VLDL CHOLESTEROL CAL: 59 mg/dL — AB (ref 5–40)

## 2018-07-14 LAB — TSH: TSH: 1.96 u[IU]/mL (ref 0.450–4.500)

## 2018-07-14 LAB — PSA: Prostate Specific Ag, Serum: 0.3 ng/mL (ref 0.0–4.0)

## 2018-07-14 NOTE — Progress Notes (Signed)
Patient to be on aspirin 81 mg daily, as noted in most recent office visit.  Given his history of CAD and remote stent placement, I recommend that he stay on aspirin 81 mg daily during the perioperative period, if possible.  Nelva Bush, MD [facility] Pager: [phone]

## 2018-07-15 ENCOUNTER — Telehealth: Payer: Self-pay

## 2018-07-15 ENCOUNTER — Telehealth: Payer: Self-pay | Admitting: Family Medicine

## 2018-07-15 NOTE — Telephone Encounter (Signed)
Patients wife has been asked to inform her husband to stop Aspirin 325mg  and change to Aspirin 81mg  5 days prior to colonoscopy.  Thanks [employer]

## 2018-07-15 NOTE — Telephone Encounter (Signed)
Ok. Noted 

## 2018-07-15 NOTE — Telephone Encounter (Signed)
Spoke with patient's wife, she states that they do not want any cholesterol medication.

## 2018-07-15 NOTE — Telephone Encounter (Signed)
Please let him know that his labs are stable, but his cholesterol is really high. I'd like to start him on a cholesterol medicine (atorvasatatin 40mg ) if he's OK with that. Everything else looks stable. Thanks!

## 2018-07-16 ENCOUNTER — Ambulatory Visit: Payer: Medicare HMO

## 2018-07-16 ENCOUNTER — Other Ambulatory Visit: Payer: Medicare HMO

## 2018-07-16 ENCOUNTER — Ambulatory Visit: Payer: Medicare HMO | Admitting: Internal Medicine

## 2018-07-19 MED FILL — JAKAFI 10 MG TABLET: 10 | 30 days supply | Qty: 60 | Fill #1

## 2018-07-30 ENCOUNTER — Inpatient Hospital Stay (HOSPITAL_BASED_OUTPATIENT_CLINIC_OR_DEPARTMENT_OTHER): Payer: Medicare HMO | Admitting: Internal Medicine

## 2018-07-30 ENCOUNTER — Encounter: Payer: Self-pay | Admitting: Internal Medicine

## 2018-07-30 ENCOUNTER — Inpatient Hospital Stay: Payer: Medicare HMO | Attending: Internal Medicine

## 2018-07-30 ENCOUNTER — Other Ambulatory Visit: Payer: Self-pay

## 2018-07-30 ENCOUNTER — Inpatient Hospital Stay: Payer: Medicare HMO

## 2018-07-30 VITALS — BP 147/83 | HR 67 | Temp 98.2°F | Resp 16 | Ht 66.0 in | Wt 180.0 lb

## 2018-07-30 DIAGNOSIS — Z79899 Other long term (current) drug therapy: Secondary | ICD-10-CM | POA: Insufficient documentation

## 2018-07-30 DIAGNOSIS — I2510 Atherosclerotic heart disease of native coronary artery without angina pectoris: Secondary | ICD-10-CM | POA: Diagnosis not present

## 2018-07-30 DIAGNOSIS — N185 Chronic kidney disease, stage 5: Secondary | ICD-10-CM

## 2018-07-30 DIAGNOSIS — Z800 Family history of malignant neoplasm of digestive organs: Secondary | ICD-10-CM | POA: Diagnosis not present

## 2018-07-30 DIAGNOSIS — R5383 Other fatigue: Secondary | ICD-10-CM | POA: Insufficient documentation

## 2018-07-30 DIAGNOSIS — R0609 Other forms of dyspnea: Secondary | ICD-10-CM | POA: Diagnosis not present

## 2018-07-30 DIAGNOSIS — D631 Anemia in chronic kidney disease: Secondary | ICD-10-CM | POA: Insufficient documentation

## 2018-07-30 DIAGNOSIS — I120 Hypertensive chronic kidney disease with stage 5 chronic kidney disease or end stage renal disease: Secondary | ICD-10-CM

## 2018-07-30 DIAGNOSIS — Z87891 Personal history of nicotine dependence: Secondary | ICD-10-CM | POA: Insufficient documentation

## 2018-07-30 DIAGNOSIS — D471 Chronic myeloproliferative disease: Secondary | ICD-10-CM

## 2018-07-30 DIAGNOSIS — R918 Other nonspecific abnormal finding of lung field: Secondary | ICD-10-CM

## 2018-07-30 DIAGNOSIS — Z8041 Family history of malignant neoplasm of ovary: Secondary | ICD-10-CM

## 2018-07-30 DIAGNOSIS — F329 Major depressive disorder, single episode, unspecified: Secondary | ICD-10-CM

## 2018-07-30 DIAGNOSIS — N183 Chronic kidney disease, stage 3 (moderate): Principal | ICD-10-CM

## 2018-07-30 LAB — CBC WITH DIFFERENTIAL/PLATELET
ABS IMMATURE GRANULOCYTES: 0.05 10*3/uL (ref 0.00–0.07)
BASOS ABS: 0 10*3/uL (ref 0.0–0.1)
BASOS PCT: 0 %
Eosinophils Absolute: 0 10*3/uL (ref 0.0–0.5)
Eosinophils Relative: 0 %
HCT: 25.2 % — ABNORMAL LOW (ref 39.0–52.0)
Hemoglobin: 8.3 g/dL — ABNORMAL LOW (ref 13.0–17.0)
Immature Granulocytes: 1 %
LYMPHS PCT: 11 %
Lymphs Abs: 0.4 10*3/uL — ABNORMAL LOW (ref 0.7–4.0)
MCH: 27.9 pg (ref 26.0–34.0)
MCHC: 32.9 g/dL (ref 30.0–36.0)
MCV: 84.8 fL (ref 80.0–100.0)
Monocytes Absolute: 0.2 10*3/uL (ref 0.1–1.0)
Monocytes Relative: 4 %
NEUTROS ABS: 3.2 10*3/uL (ref 1.7–7.7)
NRBC: 1.3 % — AB (ref 0.0–0.2)
Neutrophils Relative %: 84 %
PLATELETS: 60 10*3/uL — AB (ref 150–400)
RBC: 2.97 MIL/uL — AB (ref 4.22–5.81)
RDW: 16 % — ABNORMAL HIGH (ref 11.5–15.5)
WBC: 3.8 10*3/uL — AB (ref 4.0–10.5)

## 2018-07-30 LAB — COMPREHENSIVE METABOLIC PANEL
ALK PHOS: 41 U/L (ref 38–126)
ALT: 13 U/L (ref 0–44)
ANION GAP: 7 (ref 5–15)
AST: 13 U/L — ABNORMAL LOW (ref 15–41)
Albumin: 3.9 g/dL (ref 3.5–5.0)
BILIRUBIN TOTAL: 0.7 mg/dL (ref 0.3–1.2)
BUN: 50 mg/dL — ABNORMAL HIGH (ref 8–23)
CALCIUM: 9 mg/dL (ref 8.9–10.3)
CO2: 22 mmol/L (ref 22–32)
Chloride: 106 mmol/L (ref 98–111)
Creatinine, Ser: 3.65 mg/dL — ABNORMAL HIGH (ref 0.61–1.24)
GFR calc non Af Amer: 16 mL/min — ABNORMAL LOW (ref >60)
GFR, EST AFRICAN AMERICAN: 19 mL/min — AB (ref >60)
Glucose, Bld: 100 mg/dL — ABNORMAL HIGH (ref 70–99)
Potassium: 5.4 mmol/L — ABNORMAL HIGH (ref 3.5–5.1)
Sodium: 135 mmol/L (ref 135–145)
TOTAL PROTEIN: 6.6 g/dL (ref 6.5–8.1)

## 2018-07-30 LAB — FERRITIN: Ferritin: 321 ng/mL (ref 24–336)

## 2018-07-30 LAB — IRON AND TIBC
IRON: 127 ug/dL (ref 45–182)
Saturation Ratios: 45 % — ABNORMAL HIGH (ref 17.9–39.5)
TIBC: 284 ug/dL (ref 250–450)
UIBC: 158 ug/dL

## 2018-07-30 MED ORDER — EPOETIN ALFA-EPBX 10000 UNIT/ML IJ SOLN
10000.0000 [IU] | INTRAMUSCULAR | Status: DC
  Administered 2018-07-30: 10000 [IU] via SUBCUTANEOUS
  Filled 2018-07-30: qty 1

## 2018-07-30 NOTE — Progress Notes (Signed)
[facility] OFFICE PROGRESS NOTE  Patient Care Team: Valerie Roys, DO as PCP - General (Family Medicine) End, Harrell Gave, MD as PCP - Cardiology (Cardiology) Cammie Sickle, MD as Consulting Physician (Internal Medicine)  Cancer Staging No matching staging information was found for the patient.   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 [name]- kidney Bx- FSG [ on Prednisone Dr.[name]];   # AUGUST 1st week 2019- START ARANESP 300 mcg q 4W  # October 2nd- jakafi 10 BID [? Renal involvement]  # hx of Lung nodules- resolved [[name]]/quit smoking.  DIAGNOSIS: PRIMARY MYELOFIBROSIS  RISK:LOW     ;GOALS: Control  CURRENT/MOST RECENT THERAPY : Jakafi       Primary myelofibrosis ([place])      INTERVAL HISTORY:  James Arnold 62 y.o.  male pleasant patient above history of primary myelofibrosis Jak 2+ currently on Jakafi 10 mg BID [renally dosed] and also chronic kidney disease/FSG on prednisone is here for follow-up.   Patient continues to complain of significant fatigue.  Appetite is fair.  Continues to have prednisone 10 mg a day.  No swelling in the legs.  No fevers or chills.  Review of Systems  Constitutional: Positive for malaise/fatigue. Negative for chills, diaphoresis, fever and weight loss.  HENT: Negative for nosebleeds and sore throat.   Eyes: Negative for double vision.  Respiratory: Positive for shortness of breath. Negative for cough, hemoptysis, sputum production and wheezing.   Cardiovascular: Positive for leg swelling. Negative for chest pain, palpitations and orthopnea.  Gastrointestinal: Negative for  abdominal pain, blood in stool, constipation, diarrhea, heartburn, melena, nausea and vomiting.  Genitourinary: Negative for dysuria, frequency and urgency.  Musculoskeletal: Negative for back pain and joint pain.  Skin: Negative.  Negative for itching and rash.  Neurological: Negative for dizziness, tingling, focal weakness, weakness and headaches.  Endo/Heme/Allergies: Does not bruise/bleed easily.  Psychiatric/Behavioral: Negative for depression. The patient is not nervous/anxious and does not have insomnia.       PAST MEDICAL HISTORY :  Past Medical History:  Diagnosis Date  . Acute renal failure (ARF) ([place]) 05/15/2017  . Anemia   . Benign hypertensive renal disease   . CAD (coronary artery disease)    a. 08/1996 s/p BMS to the mLAD ([facility]); b. 12/2017 MV: EF 46%, fixed apical defect w/ significant GI uptake artifact. No ischemia-> low risk.  . CKD (chronic kidney disease), stage IV ([place])   . Depression   . Diastolic dysfunction    a. 12/2017 Echo: EF 60-65%, no rwma, Gr1 DD, mild AI/MR. Nl RVSP.  [name] Kitchen Dyspnea    with exertion  . FSGS (focal segmental glomerulosclerosis)   . Hyperlipidemia 10/2002  . Hypertension   . Myelofibrosis ([place]) 2010  . Smoker   . Thrombocytopenia ([place])     PAST SURGICAL HISTORY :   Past Surgical History:  Procedure Laterality Date  . ANGIOPLASTY    . BONE MARROW ASPIRATION  03/2009  . CORONARY STENT PLACEMENT  08/1996    FAMILY HISTORY :   Family History  Problem Relation Age of Onset  . Stroke Mother        Low BP stroke  . Depression Father   .  Depression Sister        Breast  . Heart disease Unknown   . Breast cancer Unknown   . Lung cancer Unknown   . Ovarian cancer Unknown   . Stomach cancer Unknown     SOCIAL HISTORY:   Social History   Tobacco Use  . Smoking status: Former Smoker    Packs/day: 1.00    Types: Cigarettes    Last attempt to quit: 01/11/2018    Years since quitting: 0.5  . Smokeless tobacco: Never Used  Substance  Use Topics  . Alcohol use: No    Alcohol/week: 0.0 standard drinks    Frequency: Never    Comment: patient states he has not consumed alcohol in the last month  . Drug use: No    ALLERGIES:  has No Known Allergies.  MEDICATIONS:  Current Outpatient Medications  Medication Sig Dispense Refill  . allopurinol (ZYLOPRIM) 100 MG tablet Take 0.5 tablets (50 mg total) by mouth daily. 45 tablet 3  . amLODipine (NORVASC) 10 MG tablet Take 1 tablet (10 mg total) by mouth daily. 90 tablet 1  . carvedilol (COREG) 12.5 MG tablet Take 1 tablet (12.5 mg total) by mouth 2 (two) times daily. 180 tablet 3  . citalopram (CELEXA) 20 MG tablet Take 1 tablet (20 mg total) by mouth daily. 90 tablet 1  . ferrous sulfate 325 (65 FE) MG tablet Take 1 tablet (325 mg total) by mouth daily with breakfast. 30 tablet 1  . furosemide (LASIX) 20 MG tablet Take 1 tablet (20 mg total) by mouth daily. 90 tablet 0  . predniSONE (DELTASONE) 10 MG tablet 6 tabs today, 5 tabs tomorrow, decrease by 1 every day until gone. 21 tablet 0  . ruxolitinib phosphate (JAKAFI) 10 MG tablet Take 1 tablet (10 mg total) by mouth 2 (two) times daily. 60 tablet 4  . sodium polystyrene (KAYEXALATE) powder 15 ml every 4-6 hours; until 2 loose stools. 454 g 0  . traMADol (ULTRAM) 50 MG tablet Take 1 tablet (50 mg total) by mouth every 8 (eight) hours as needed. 21 tablet 0   No current facility-administered medications for this visit.     PHYSICAL EXAMINATION: ECOG PERFORMANCE STATUS: 1 - Symptomatic but completely ambulatory  BP (!) 147/83 (BP Location: Right Arm, Patient Position: Sitting)   Pulse 67   Temp 98.2 F (36.8 C) (Oral)   Resp 16   Ht '5\' 6"'  (1.676 m)   Wt 180 lb (81.6 kg)   BMI 29.05 kg/m   Filed Weights   07/30/18 1430  Weight: 180 lb (81.6 kg)    Physical Exam  Constitutional: He is oriented to person, place, and time and well-developed, well-nourished, and in no distress.  Accompanied by his wife.  HENT:   Head: Normocephalic and atraumatic.  Mouth/Throat: Oropharynx is clear and moist. No oropharyngeal exudate.  Eyes: Pupils are equal, round, and reactive to light.  Neck: Normal range of motion. Neck supple.  Cardiovascular: Normal rate and regular rhythm.  Pulmonary/Chest: No respiratory distress. He has no wheezes.  Decreased air entry bilaterally.  Abdominal: Soft. Bowel sounds are normal. He exhibits no distension and no mass. There is no tenderness. There is no rebound and no guarding.  Musculoskeletal: Normal range of motion. He exhibits no edema or tenderness.  Neurological: He is alert and oriented to person, place, and time.  Skin: Skin is warm.  Psychiatric: Affect normal.       LABORATORY DATA:  I have reviewed the  data as listed    Component Value Date/Time   NA 135 07/30/2018 1411   NA 134 07/13/2018 1101   K 5.4 (H) 07/30/2018 1411   CL 106 07/30/2018 1411   CO2 22 07/30/2018 1411   GLUCOSE 100 (H) 07/30/2018 1411   BUN 50 (H) 07/30/2018 1411   BUN 57 (H) 07/13/2018 1101   CREATININE 3.65 (H) 07/30/2018 1411   CREATININE 1.12 09/30/2013 0953   CALCIUM 9.0 07/30/2018 1411   PROT 6.6 07/30/2018 1411   PROT 5.9 (L) 07/13/2018 1101   ALBUMIN 3.9 07/30/2018 1411   ALBUMIN 4.1 07/13/2018 1101   AST 13 (L) 07/30/2018 1411   ALT 13 07/30/2018 1411   ALKPHOS 41 07/30/2018 1411   BILITOT 0.7 07/30/2018 1411   BILITOT 0.3 07/13/2018 1101   GFRNONAA 16 (L) 07/30/2018 1411   GFRNONAA >60 09/30/2013 0953   GFRAA 19 (L) 07/30/2018 1411   GFRAA >60 09/30/2013 0953    No results found for: SPEP, UPEP  Lab Results  Component Value Date   WBC 3.8 (L) 07/30/2018   NEUTROABS 3.2 07/30/2018   HGB 8.3 (L) 07/30/2018   HCT 25.2 (L) 07/30/2018   MCV 84.8 07/30/2018   PLT 60 (L) 07/30/2018      Chemistry      Component Value Date/Time   NA 135 07/30/2018 1411   NA 134 07/13/2018 1101   K 5.4 (H) 07/30/2018 1411   CL 106 07/30/2018 1411   CO2 22 07/30/2018 1411    BUN 50 (H) 07/30/2018 1411   BUN 57 (H) 07/13/2018 1101   CREATININE 3.65 (H) 07/30/2018 1411   CREATININE 1.12 09/30/2013 0953      Component Value Date/Time   CALCIUM 9.0 07/30/2018 1411   ALKPHOS 41 07/30/2018 1411   AST 13 (L) 07/30/2018 1411   ALT 13 07/30/2018 1411   BILITOT 0.7 07/30/2018 1411   BILITOT 0.3 07/13/2018 1101       RADIOGRAPHIC STUDIES: I have personally reviewed the radiological images as listed and agreed with the findings in the report. No results found.   ASSESSMENT & PLAN:  Primary myelofibrosis ([place]) # Primary Myelofiborisis [Bone marrow 2010] jak-2 positive;  Currently on jakafi 10 mg BID on October 2nd [? Renal involvement].  Stable.  #Clinically no significant improvement noted either in renal function/symptoms of[continued fatigue]; worsening anemia thrombocytopenia [60] secondary to [facility].  Monitor closely.  #Hyperkalemia potassium-5.4.  Chronic.  Defer to nephrology for further management.  #Anemia secondary to chronic kidney disease; Hb hemoglobin 8.3.  Proceed with retacrit.   #CKD stage V [Dr. Latif] S/p Kidney Biopsy- FSG; stable;not on dialysis.    # DISPOSITION:  # Retacrit injection today.  # in 2 weeks- labs-cbc/bmp/retacrit injection # Follow-up in 4 weeks;MD labs-cbc/cmp/ldh; retacrit injection.    Cc; Dr.[name]   No orders of the defined types were placed in this encounter.  All questions were answered. The patient knows to call the clinic with any problems, questions or concerns.     Cammie Sickle, MD 08/03/2018 9:00 AM

## 2018-07-30 NOTE — Progress Notes (Signed)
Patient reports being extremely tired and occasionally "weak".

## 2018-07-30 NOTE — Assessment & Plan Note (Addendum)
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 Renal involvement].  Stable.  #Clinically no significant improvement noted either in renal function/symptoms of[continued fatigue]; worsening anemia thrombocytopenia [60] secondary to [facility].  Monitor closely.  #Hyperkalemia potassium-5.4.  Chronic.  Defer to nephrology for further management.  #Anemia secondary to chronic kidney disease; Hb hemoglobin 8.3.  Proceed with retacrit.   #CKD stage V [Dr. Latif] S/p Kidney Biopsy- FSG; stable;not on dialysis.    # DISPOSITION:  # Retacrit injection today.  # in 2 weeks- labs-cbc/bmp/retacrit injection # Follow-up in 4 weeks;MD labs-cbc/cmp/ldh; retacrit injection.    Cc; Dr.[name]

## 2018-08-06 ENCOUNTER — Telehealth: Payer: Self-pay

## 2018-08-06 NOTE — Telephone Encounter (Signed)
Patients wife contacted office stated her husband bowel prep was not for Suprep it was for gallon liquid bowel prep.  I provided her with instructions for the bowel prep.  She states her husband will be upset that he had to use this bowel prep.  I apologized to her for the inconvenience of the rx change however the request was changed due to insurance coverage for Suprep.   Thanks [employer]

## 2018-08-09 ENCOUNTER — Ambulatory Visit: Payer: Medicare HMO | Admitting: Anesthesiology

## 2018-08-09 ENCOUNTER — Ambulatory Visit
Admission: RE | Admit: 2018-08-09 | Discharge: 2018-08-09 | Disposition: A | Discharge status: Home / Self Care | Payer: Medicare HMO | Source: Ambulatory Visit | Attending: Gastroenterology | Admitting: Gastroenterology

## 2018-08-09 ENCOUNTER — Encounter: Payer: Self-pay | Admitting: *Deleted

## 2018-08-09 ENCOUNTER — Encounter
Admission: RE | Disposition: A | Discharge status: Home / Self Care | Payer: Self-pay | Source: Ambulatory Visit | Attending: Gastroenterology

## 2018-08-09 DIAGNOSIS — Z87891 Personal history of nicotine dependence: Secondary | ICD-10-CM | POA: Insufficient documentation

## 2018-08-09 DIAGNOSIS — F329 Major depressive disorder, single episode, unspecified: Secondary | ICD-10-CM | POA: Diagnosis not present

## 2018-08-09 DIAGNOSIS — Z951 Presence of aortocoronary bypass graft: Secondary | ICD-10-CM | POA: Insufficient documentation

## 2018-08-09 DIAGNOSIS — K640 First degree hemorrhoids: Secondary | ICD-10-CM | POA: Diagnosis not present

## 2018-08-09 DIAGNOSIS — Z955 Presence of coronary angioplasty implant and graft: Secondary | ICD-10-CM | POA: Diagnosis not present

## 2018-08-09 DIAGNOSIS — Z79899 Other long term (current) drug therapy: Secondary | ICD-10-CM | POA: Diagnosis not present

## 2018-08-09 DIAGNOSIS — D631 Anemia in chronic kidney disease: Secondary | ICD-10-CM | POA: Diagnosis not present

## 2018-08-09 DIAGNOSIS — K648 Other hemorrhoids: Secondary | ICD-10-CM | POA: Diagnosis not present

## 2018-08-09 DIAGNOSIS — I129 Hypertensive chronic kidney disease with stage 1 through stage 4 chronic kidney disease, or unspecified chronic kidney disease: Secondary | ICD-10-CM | POA: Diagnosis not present

## 2018-08-09 DIAGNOSIS — Z1211 Encounter for screening for malignant neoplasm of colon: Secondary | ICD-10-CM | POA: Diagnosis not present

## 2018-08-09 DIAGNOSIS — D7581 Myelofibrosis: Secondary | ICD-10-CM | POA: Insufficient documentation

## 2018-08-09 DIAGNOSIS — I2510 Atherosclerotic heart disease of native coronary artery without angina pectoris: Secondary | ICD-10-CM | POA: Insufficient documentation

## 2018-08-09 DIAGNOSIS — N184 Chronic kidney disease, stage 4 (severe): Secondary | ICD-10-CM | POA: Insufficient documentation

## 2018-08-09 HISTORY — PX: COLONOSCOPY WITH PROPOFOL: SHX5780

## 2018-08-09 SURGERY — COLONOSCOPY WITH PROPOFOL
Anesthesia: General

## 2018-08-09 MED ORDER — LIDOCAINE HCL (CARDIAC) PF 100 MG/5ML IV SOSY
PREFILLED_SYRINGE | INTRAVENOUS | Status: DC | PRN
  Administered 2018-08-09: 30 mg via INTRAVENOUS

## 2018-08-09 MED ORDER — PROPOFOL 10 MG/ML IV BOLUS
INTRAVENOUS | Status: DC | PRN
  Administered 2018-08-09 (×2): 50 mg via INTRAVENOUS

## 2018-08-09 MED ORDER — PROPOFOL 500 MG/50ML IV EMUL
INTRAVENOUS | Status: DC | PRN
  Administered 2018-08-09: 125 ug/kg/min via INTRAVENOUS

## 2018-08-09 MED ORDER — SODIUM CHLORIDE 0.9 % IV SOLN
INTRAVENOUS | Status: DC
  Administered 2018-08-09: 1000 mL via INTRAVENOUS

## 2018-08-09 NOTE — H&P (Signed)
[name], MD [address], [place], [place], [place], [address] [address], [place], [place], [place], [address] Phone: [phone]  Fax: [phone]  Primary Care Physician:  Valerie Roys, DO   Pre-Procedure History & Physical: HPI:  James Arnold is a 62 y.o. male is here for an colonoscopy.   Past Medical History:  Diagnosis Date  . Acute renal failure (ARF) ([place]) 05/15/2017  . Anemia   . Benign hypertensive renal disease   . CAD (coronary artery disease)    a. 08/1996 s/p BMS to the mLAD ([facility]); b. 12/2017 MV: EF 46%, fixed apical defect w/ significant GI uptake artifact. No ischemia-> low risk.  . CKD (chronic kidney disease), stage IV ([place])   . Depression   . Diastolic dysfunction    a. 12/2017 Echo: EF 60-65%, no rwma, Gr1 DD, mild AI/MR. Nl RVSP.  [name] Kitchen Dyspnea    with exertion  . FSGS (focal segmental glomerulosclerosis)   . Hyperlipidemia 10/2002  . Hypertension   . Myelofibrosis ([place]) 2010  . Smoker   . Thrombocytopenia ([place])     Past Surgical History:  Procedure Laterality Date  . ANGIOPLASTY    . BONE MARROW ASPIRATION  03/2009  . CARDIAC CATHETERIZATION    . CORONARY ARTERY BYPASS GRAFT    . CORONARY STENT PLACEMENT  08/1996    Prior to Admission medications   Medication Sig Start Date End Date Taking? Authorizing Provider  allopurinol (ZYLOPRIM) 100 MG tablet Take 0.5 tablets (50 mg total) by mouth daily. 07/13/18  Yes Johnson, Megan P, DO  amLODipine (NORVASC) 10 MG tablet Take 1 tablet (10 mg total) by mouth daily. 07/13/18 08/12/18 Yes Johnson, Megan P, DO  citalopram (CELEXA) 20 MG tablet Take 1 tablet (20 mg total) by mouth daily. 07/13/18  Yes Johnson, Megan P, DO  furosemide (LASIX) 20 MG tablet Take 1 tablet (20 mg total) by mouth daily. 07/13/18  Yes Johnson, Megan P, DO  predniSONE (DELTASONE) 10 MG tablet 6 tabs today, 5 tabs tomorrow, decrease by 1 every day until gone. 07/13/18  Yes Johnson, Megan P, DO  ruxolitinib phosphate  (JAKAFI) 10 MG tablet Take 1 tablet (10 mg total) by mouth 2 (two) times daily. 06/21/18  Yes Charlaine Dalton R, MD  sodium polystyrene (KAYEXALATE) powder 15 ml every 4-6 hours; until 2 loose stools. 05/17/18  Yes Cammie Sickle, MD  traMADol (ULTRAM) 50 MG tablet Take 1 tablet (50 mg total) by mouth every 8 (eight) hours as needed. 01/22/18  Yes Johnson, Megan P, DO  carvedilol (COREG) 12.5 MG tablet Take 1 tablet (12.5 mg total) by mouth 2 (two) times daily. 01/28/18 07/30/18  Theora Gianotti, NP  ferrous sulfate 325 (65 FE) MG tablet Take 1 tablet (325 mg total) by mouth daily with breakfast. 12/30/17 07/30/18  Saundra Shelling, MD    Allergies as of 07/13/2018  . (No Known Allergies)    Family History  Problem Relation Age of Onset  . Stroke Mother        Low BP stroke  . Depression Father   . Depression Sister        Breast  . Heart disease Unknown   . Breast cancer Unknown   . Lung cancer Unknown   . Ovarian cancer Unknown   . Stomach cancer Unknown     Social History   Socioeconomic History  . Marital status: Married    Spouse name: Not on file  . Number of  children: Not on file  . Years of education: Not on file  . Highest education level: Not on file  Occupational History  . Not on file  Social Needs  . Financial resource strain: Patient refused  . Food insecurity:    Worry: Patient refused    Inability: Patient refused  . Transportation needs:    Medical: Patient refused    Non-medical: Patient refused  Tobacco Use  . Smoking status: Former Smoker    Packs/day: 1.00    Types: Cigarettes    Last attempt to quit: 01/11/2018    Years since quitting: 0.5  . Smokeless tobacco: Never Used  Substance and Sexual Activity  . Alcohol use: No    Alcohol/week: 0.0 standard drinks    Frequency: Never    Comment: patient states he has not consumed alcohol in the last month  . Drug use: No  . Sexual activity: Yes  Lifestyle  . Physical activity:    Days  per week: Patient refused    Minutes per session: Patient refused  . Stress: Patient refused  Relationships  . Social connections:    Talks on phone: Patient refused    Gets together: Patient refused    Attends religious service: Patient refused    Active member of club or organization: Patient refused    Attends meetings of clubs or organizations: Patient refused    Relationship status: Married  . Intimate partner violence:    Fear of current or ex partner: Patient refused    Emotionally abused: Patient refused    Physically abused: Patient refused    Forced sexual activity: Patient refused  Other Topics Concern  . Not on file  Social History Narrative  . Not on file    Review of Systems: See HPI, otherwise negative ROS  Physical Exam: BP 128/75   Pulse 76   Temp 98 F (36.7 C) (Tympanic)   Resp 16   Ht 5\' 7"  (1.702 m)   Wt 79.4 kg   SpO2 100%   BMI 27.41 kg/m  General:   Alert,  pleasant and cooperative in NAD Head:  Normocephalic and atraumatic. Neck:  Supple; no masses or thyromegaly. Lungs:  Clear throughout to auscultation, normal respiratory effort.    Heart:  +S1, +S2, Regular rate and rhythm, No edema. Abdomen:  Soft, nontender and nondistended. Normal bowel sounds, without guarding, and without rebound.   Neurologic:  Alert and  oriented x4;  grossly normal neurologically.  Impression/Plan: James Arnold is here for an colonoscopy to be performed for Screening colonoscopy average risk   Risks, benefits, limitations, and alternatives regarding  colonoscopy have been reviewed with the patient.  Questions have been answered.  All parties agreeable.   [name], MD  08/09/2018, 10:48 AM

## 2018-08-09 NOTE — Anesthesia Preprocedure Evaluation (Signed)
Anesthesia Evaluation  Patient identified by MRN, date of birth, ID band Patient awake    Reviewed: Allergy & Precautions, NPO status , Patient's Chart, lab work & pertinent test results  History of Anesthesia Complications Negative for: history of anesthetic complications  Airway Mallampati: III  TM Distance: >3 FB Neck ROM: Full    Dental  (+) Upper Dentures, Lower Dentures   Pulmonary neg sleep apnea, neg COPD, former smoker,    breath sounds clear to auscultation- rhonchi (-) wheezing      Cardiovascular hypertension, Pt. on medications + CAD and + Cardiac Stents  (-) CABG  Rhythm:Regular Rate:Normal - Systolic murmurs and - Diastolic murmurs    Neuro/Psych PSYCHIATRIC DISORDERS Depression negative neurological ROS     GI/Hepatic negative GI ROS, Neg liver ROS,   Endo/Other  negative endocrine ROS  Renal/GU Renal InsufficiencyRenal disease     Musculoskeletal negative musculoskeletal ROS (+)   Abdominal (+) - obese,   Peds  Hematology  (+) anemia ,   Anesthesia Other Findings Past Medical History: 05/15/2017: Acute renal failure (ARF) ([facility]) No date: Anemia No date: Benign hypertensive renal disease No date: CAD (coronary artery disease)     Comment:  a. 08/1996 s/p BMS to the mLAD ([facility]); b. 12/2017 MV: EF               46%, fixed apical defect w/ significant GI uptake               artifact. No ischemia-> low risk. No date: CKD (chronic kidney disease), stage IV ([facility]) No date: Depression No date: Diastolic dysfunction     Comment:  a. 12/2017 Echo: EF 60-65%, no rwma, Gr1 DD, mild AI/MR.               Nl RVSP. No date: Dyspnea     Comment:  with exertion No date: FSGS (focal segmental glomerulosclerosis) 10/2002: Hyperlipidemia No date: Hypertension 2010: Myelofibrosis ([place]) No date: Smoker No date: Thrombocytopenia ([place])   Reproductive/Obstetrics                             Anesthesia Physical Anesthesia Plan  ASA: III  Anesthesia Plan: General   Post-op Pain Management:    Induction: Intravenous  PONV Risk Score and Plan: 1 and Propofol infusion  Airway Management Planned: Natural Airway  Additional Equipment:   Intra-op Plan:   Post-operative Plan:   Informed Consent: I have reviewed the patients History and Physical, chart, labs and discussed the procedure including the risks, benefits and alternatives for the proposed anesthesia with the patient or authorized representative who has indicated his/her understanding and acceptance.   Dental advisory given  Plan Discussed with: CRNA and Anesthesiologist  Anesthesia Plan Comments:         Anesthesia Quick Evaluation

## 2018-08-09 NOTE — Anesthesia Post-op Follow-up Note (Signed)
Anesthesia QCDR form completed.        

## 2018-08-09 NOTE — Transfer of Care (Signed)
Immediate Anesthesia Transfer of Care Note  Patient: James Arnold  Procedure(s) Performed: COLONOSCOPY WITH PROPOFOL (N/A )  Patient Location: PACU and Endoscopy Unit  Anesthesia Type:General  Level of Consciousness: awake  Airway & Oxygen Therapy: Patient Spontanous Breathing  Post-op Assessment: Report given to RN  Post vital signs: stable  Last Vitals:  Vitals Value Taken Time  BP 93/58 08/09/2018 11:22 AM  Temp 36.1 C 08/09/2018 11:21 AM  Pulse 74 08/09/2018 11:22 AM  Resp 14 08/09/2018 11:22 AM  SpO2 93 % 08/09/2018 11:22 AM  Vitals shown include unvalidated device data.  Last Pain:  Vitals:   08/09/18 1121  TempSrc: Tympanic  PainSc: 0-No pain         Complications: No apparent anesthesia complications

## 2018-08-09 NOTE — Op Note (Signed)
[facility] Patient Name: James Arnold Procedure Date: 08/09/2018 10:50 AM MRN: [id number] Account #: [id number] Date of Birth: November 01, 1955 Admit Type: Outpatient Age: 62 Room: [facility] ROOM 4 Gender: Male Note Status: Finalized Procedure:            Colonoscopy Indications:          Screening for colorectal malignant neoplasm Providers:            [name] MD, MD Referring MD:         Valerie Roys (Referring MD) Medicines:            Monitored Anesthesia Care Complications:        No immediate complications. Procedure:            Pre-Anesthesia Assessment:                       - Prior to the procedure, a History and Physical was                        performed, and patient medications, allergies and                        sensitivities were reviewed. The patient's tolerance of                        previous anesthesia was reviewed.                       - The risks and benefits of the procedure and the                        sedation options and risks were discussed with the                        patient. All questions were answered and informed                        consent was obtained.                       - ASA Grade Assessment: II - A patient with mild                        systemic disease.                       After obtaining informed consent, the colonoscope was                        passed under direct vision. Throughout the procedure,                        the patient's blood pressure, pulse, and oxygen                        saturations were monitored continuously. The                        Colonoscope was introduced through the anus and  advanced to the the cecum, identified by the                        appendiceal orifice, IC valve and transillumination.                        The colonoscopy was performed with ease. The patient                        tolerated the procedure well. The quality  of the bowel                        preparation was good. Findings:      The perianal and digital rectal examinations were normal.      Non-bleeding internal hemorrhoids were found during retroflexion. The       hemorrhoids were moderate and Grade I (internal hemorrhoids that do not       prolapse).      The exam was otherwise without abnormality on direct and retroflexion       views. Impression:           - Non-bleeding internal hemorrhoids.                       - The examination was otherwise normal on direct and                        retroflexion views.                       - No specimens collected. Recommendation:       - Discharge patient to home (with escort).                       - Resume previous diet.                       - Continue present medications.                       - Repeat colonoscopy in 10 years for screening purposes. Procedure Code(s):    --- Professional ---                       [phone], Colonoscopy, flexible; diagnostic, including                        collection of specimen(s) by brushing or washing, when                        performed (separate procedure) Diagnosis Code(s):    --- Professional ---                       Z12.11, Encounter for screening for malignant neoplasm                        of colon                       K64.0, First degree hemorrhoids CPT copyright 2018 [employer]. All rights reserved. The codes documented in this report are preliminary and upon coder review may  be revised to  meet current compliance requirements. [name], MD [name] MD, MD 08/09/2018 11:19:19 AM This report has been signed electronically. Number of Addenda: 0 Note Initiated On: 08/09/2018 10:50 AM Scope Withdrawal Time: 0 hours 10 minutes 47 seconds  Total Procedure Duration: 0 hours 16 minutes 8 seconds       [facility]

## 2018-08-09 NOTE — Anesthesia Postprocedure Evaluation (Signed)
Anesthesia Post Note  Patient: James Arnold  Procedure(s) Performed: COLONOSCOPY WITH PROPOFOL (N/A )  Patient location during evaluation: Endoscopy Anesthesia Type: General Level of consciousness: awake and alert and oriented Pain management: pain level controlled Vital Signs Assessment: post-procedure vital signs reviewed and stable Respiratory status: spontaneous breathing, nonlabored ventilation and respiratory function stable Cardiovascular status: blood pressure returned to baseline and stable Postop Assessment: no signs of nausea or vomiting Anesthetic complications: no     Last Vitals:  Vitals:   08/09/18 1121 08/09/18 1124  BP:    Pulse: 76   Resp: 18   Temp: (!) 36.1 C 36.7 C  SpO2: 96%     Last Pain:  Vitals:   08/09/18 1124  TempSrc: Tympanic  PainSc:                  Verdon Ferrante

## 2018-08-13 ENCOUNTER — Inpatient Hospital Stay: Payer: Medicare HMO

## 2018-08-13 ENCOUNTER — Encounter: Payer: Self-pay | Admitting: Gastroenterology

## 2018-08-13 VITALS — BP 145/87 | HR 64

## 2018-08-13 DIAGNOSIS — D471 Chronic myeloproliferative disease: Secondary | ICD-10-CM

## 2018-08-13 DIAGNOSIS — R0609 Other forms of dyspnea: Secondary | ICD-10-CM | POA: Diagnosis not present

## 2018-08-13 DIAGNOSIS — F329 Major depressive disorder, single episode, unspecified: Secondary | ICD-10-CM | POA: Diagnosis not present

## 2018-08-13 DIAGNOSIS — R5383 Other fatigue: Secondary | ICD-10-CM | POA: Diagnosis not present

## 2018-08-13 DIAGNOSIS — D631 Anemia in chronic kidney disease: Secondary | ICD-10-CM

## 2018-08-13 DIAGNOSIS — I2510 Atherosclerotic heart disease of native coronary artery without angina pectoris: Secondary | ICD-10-CM | POA: Diagnosis not present

## 2018-08-13 DIAGNOSIS — N183 Chronic kidney disease, stage 3 unspecified: Secondary | ICD-10-CM

## 2018-08-13 DIAGNOSIS — I120 Hypertensive chronic kidney disease with stage 5 chronic kidney disease or end stage renal disease: Secondary | ICD-10-CM | POA: Diagnosis not present

## 2018-08-13 DIAGNOSIS — N185 Chronic kidney disease, stage 5: Secondary | ICD-10-CM | POA: Diagnosis not present

## 2018-08-13 DIAGNOSIS — R918 Other nonspecific abnormal finding of lung field: Secondary | ICD-10-CM | POA: Diagnosis not present

## 2018-08-13 LAB — CBC WITH DIFFERENTIAL/PLATELET
Abs Immature Granulocytes: 0.12 10*3/uL — ABNORMAL HIGH (ref 0.00–0.07)
Basophils Absolute: 0 10*3/uL (ref 0.0–0.1)
Basophils Relative: 0 %
Eosinophils Absolute: 0 10*3/uL (ref 0.0–0.5)
Eosinophils Relative: 0 %
HEMATOCRIT: 24.3 % — AB (ref 39.0–52.0)
HEMOGLOBIN: 7.9 g/dL — AB (ref 13.0–17.0)
IMMATURE GRANULOCYTES: 3 %
Lymphocytes Relative: 14 %
Lymphs Abs: 0.6 10*3/uL — ABNORMAL LOW (ref 0.7–4.0)
MCH: 28.3 pg (ref 26.0–34.0)
MCHC: 32.5 g/dL (ref 30.0–36.0)
MCV: 87.1 fL (ref 80.0–100.0)
MONO ABS: 0.1 10*3/uL (ref 0.1–1.0)
MONOS PCT: 3 %
Neutro Abs: 3.3 10*3/uL (ref 1.7–7.7)
Neutrophils Relative %: 80 %
Platelets: 87 10*3/uL — ABNORMAL LOW (ref 150–400)
RBC: 2.79 MIL/uL — AB (ref 4.22–5.81)
RDW: 18.2 % — AB (ref 11.5–15.5)
WBC: 4.1 10*3/uL (ref 4.0–10.5)
nRBC: 1 % — ABNORMAL HIGH (ref 0.0–0.2)

## 2018-08-13 LAB — COMPREHENSIVE METABOLIC PANEL
ALK PHOS: 41 U/L (ref 38–126)
ALT: 13 U/L (ref 0–44)
ANION GAP: 8 (ref 5–15)
AST: 17 U/L (ref 15–41)
Albumin: 4.1 g/dL (ref 3.5–5.0)
BILIRUBIN TOTAL: 0.8 mg/dL (ref 0.3–1.2)
BUN: 55 mg/dL — AB (ref 8–23)
CO2: 21 mmol/L — AB (ref 22–32)
Calcium: 9.1 mg/dL (ref 8.9–10.3)
Chloride: 106 mmol/L (ref 98–111)
Creatinine, Ser: 3.97 mg/dL — ABNORMAL HIGH (ref 0.61–1.24)
GFR calc Af Amer: 17 mL/min — ABNORMAL LOW (ref >60)
GFR calc non Af Amer: 15 mL/min — ABNORMAL LOW (ref >60)
GLUCOSE: 123 mg/dL — AB (ref 70–99)
POTASSIUM: 4.7 mmol/L (ref 3.5–5.1)
SODIUM: 135 mmol/L (ref 135–145)
TOTAL PROTEIN: 6.8 g/dL (ref 6.5–8.1)

## 2018-08-13 MED ORDER — EPOETIN ALFA-EPBX 10000 UNIT/ML IJ SOLN
10000.0000 [IU] | INTRAMUSCULAR | Status: DC
  Administered 2018-08-13: 10000 [IU] via SUBCUTANEOUS
  Filled 2018-08-13: qty 1

## 2018-08-16 MED FILL — JAKAFI 10 MG TABLET: 10 | 30 days supply | Qty: 60 | Fill #2

## 2018-08-27 ENCOUNTER — Inpatient Hospital Stay: Payer: Medicare HMO | Attending: Internal Medicine

## 2018-08-27 ENCOUNTER — Other Ambulatory Visit: Payer: Self-pay

## 2018-08-27 ENCOUNTER — Inpatient Hospital Stay: Payer: Medicare HMO

## 2018-08-27 ENCOUNTER — Inpatient Hospital Stay (HOSPITAL_BASED_OUTPATIENT_CLINIC_OR_DEPARTMENT_OTHER): Payer: Medicare HMO | Admitting: Internal Medicine

## 2018-08-27 VITALS — BP 151/82 | HR 64 | Temp 98.6°F | Resp 16 | Wt 179.6 lb

## 2018-08-27 DIAGNOSIS — Z79899 Other long term (current) drug therapy: Secondary | ICD-10-CM | POA: Diagnosis not present

## 2018-08-27 DIAGNOSIS — E785 Hyperlipidemia, unspecified: Secondary | ICD-10-CM | POA: Diagnosis not present

## 2018-08-27 DIAGNOSIS — F329 Major depressive disorder, single episode, unspecified: Secondary | ICD-10-CM

## 2018-08-27 DIAGNOSIS — R918 Other nonspecific abnormal finding of lung field: Secondary | ICD-10-CM

## 2018-08-27 DIAGNOSIS — D471 Chronic myeloproliferative disease: Secondary | ICD-10-CM | POA: Diagnosis not present

## 2018-08-27 DIAGNOSIS — I120 Hypertensive chronic kidney disease with stage 5 chronic kidney disease or end stage renal disease: Secondary | ICD-10-CM

## 2018-08-27 DIAGNOSIS — N185 Chronic kidney disease, stage 5: Secondary | ICD-10-CM | POA: Diagnosis not present

## 2018-08-27 DIAGNOSIS — R5383 Other fatigue: Secondary | ICD-10-CM

## 2018-08-27 DIAGNOSIS — Z801 Family history of malignant neoplasm of trachea, bronchus and lung: Secondary | ICD-10-CM

## 2018-08-27 DIAGNOSIS — E875 Hyperkalemia: Secondary | ICD-10-CM | POA: Diagnosis not present

## 2018-08-27 DIAGNOSIS — R531 Weakness: Secondary | ICD-10-CM | POA: Insufficient documentation

## 2018-08-27 DIAGNOSIS — Z87891 Personal history of nicotine dependence: Secondary | ICD-10-CM | POA: Insufficient documentation

## 2018-08-27 DIAGNOSIS — N183 Chronic kidney disease, stage 3 unspecified: Secondary | ICD-10-CM

## 2018-08-27 DIAGNOSIS — Z803 Family history of malignant neoplasm of breast: Secondary | ICD-10-CM | POA: Diagnosis not present

## 2018-08-27 DIAGNOSIS — D631 Anemia in chronic kidney disease: Secondary | ICD-10-CM

## 2018-08-27 DIAGNOSIS — I2510 Atherosclerotic heart disease of native coronary artery without angina pectoris: Secondary | ICD-10-CM

## 2018-08-27 LAB — COMPREHENSIVE METABOLIC PANEL
ALBUMIN: 4.3 g/dL (ref 3.5–5.0)
ALT: 15 U/L (ref 0–44)
AST: 15 U/L (ref 15–41)
Alkaline Phosphatase: 41 U/L (ref 38–126)
Anion gap: 10 (ref 5–15)
BILIRUBIN TOTAL: 0.8 mg/dL (ref 0.3–1.2)
BUN: 65 mg/dL — ABNORMAL HIGH (ref 8–23)
CHLORIDE: 109 mmol/L (ref 98–111)
CO2: 22 mmol/L (ref 22–32)
Calcium: 8.9 mg/dL (ref 8.9–10.3)
Creatinine, Ser: 3.58 mg/dL — ABNORMAL HIGH (ref 0.61–1.24)
GFR calc Af Amer: 20 mL/min — ABNORMAL LOW (ref >60)
GFR, EST NON AFRICAN AMERICAN: 17 mL/min — AB (ref >60)
GLUCOSE: 105 mg/dL — AB (ref 70–99)
POTASSIUM: 5.2 mmol/L — AB (ref 3.5–5.1)
Sodium: 141 mmol/L (ref 135–145)
TOTAL PROTEIN: 6.9 g/dL (ref 6.5–8.1)

## 2018-08-27 LAB — IRON AND TIBC
Iron: 135 ug/dL (ref 45–182)
Saturation Ratios: 47 % — ABNORMAL HIGH (ref 17.9–39.5)
TIBC: 286 ug/dL (ref 250–450)
UIBC: 151 ug/dL

## 2018-08-27 LAB — CBC WITH DIFFERENTIAL/PLATELET
ABS IMMATURE GRANULOCYTES: 0.08 10*3/uL — AB (ref 0.00–0.07)
BASOS PCT: 0 %
Basophils Absolute: 0 10*3/uL (ref 0.0–0.1)
Eosinophils Absolute: 0 10*3/uL (ref 0.0–0.5)
Eosinophils Relative: 0 %
HCT: 25.8 % — ABNORMAL LOW (ref 39.0–52.0)
Hemoglobin: 8.4 g/dL — ABNORMAL LOW (ref 13.0–17.0)
IMMATURE GRANULOCYTES: 2 %
Lymphocytes Relative: 11 %
Lymphs Abs: 0.5 10*3/uL — ABNORMAL LOW (ref 0.7–4.0)
MCH: 28.7 pg (ref 26.0–34.0)
MCHC: 32.6 g/dL (ref 30.0–36.0)
MCV: 88.1 fL (ref 80.0–100.0)
Monocytes Absolute: 0.2 10*3/uL (ref 0.1–1.0)
Monocytes Relative: 4 %
NEUTROS ABS: 3.9 10*3/uL (ref 1.7–7.7)
NEUTROS PCT: 83 %
PLATELETS: 104 10*3/uL — AB (ref 150–400)
RBC: 2.93 MIL/uL — AB (ref 4.22–5.81)
RDW: 19.5 % — ABNORMAL HIGH (ref 11.5–15.5)
WBC: 4.7 10*3/uL (ref 4.0–10.5)
nRBC: 1.7 % — ABNORMAL HIGH (ref 0.0–0.2)

## 2018-08-27 LAB — FERRITIN: Ferritin: 241 ng/mL (ref 24–336)

## 2018-08-27 MED ORDER — EPOETIN ALFA-EPBX 10000 UNIT/ML IJ SOLN
10000.0000 [IU] | INTRAMUSCULAR | Status: DC
  Administered 2018-08-27: 10000 [IU] via SUBCUTANEOUS
  Filled 2018-08-27: qty 1

## 2018-08-27 NOTE — Progress Notes (Signed)
[place] OFFICE PROGRESS NOTE  Patient Care Team: Valerie Roys, DO as PCP - General (Family Medicine) End, Harrell Gave, MD as PCP - Cardiology (Cardiology) Cammie Sickle, MD as Consulting Physician (Internal Medicine)  Cancer Staging No matching staging information was found for the patient.   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 [name]- kidney Bx- FSG [ on Prednisone Dr.[name]];   # AUGUST 1st week 2019- START ARANESP 300 mcg q 4W  # October 2nd- jakafi 10 BID [? Renal involvement]  # hx of Lung nodules- resolved [[name]]/quit smoking.  DIAGNOSIS: PRIMARY MYELOFIBROSIS  RISK:LOW     ;GOALS: Control  CURRENT/MOST RECENT THERAPY : Jakafi       Primary myelofibrosis ([place])      INTERVAL HISTORY:  James Arnold 62 y.o.  male pleasant patient above history of primary myelofibrosis Jak 2+ currently on Jakafi 10 mg BID [renally dosed] and also chronic kidney disease/FSG on prednisone is here for follow-up.   Patient denies any worsening swelling the legs.  He continues on prednisone 10 mg a day.  Continues to complain of fatigue.  Not improved.  No bleeding.  Easy bruising.  No chills.  No fevers.  No weight loss.   Review of Systems  Constitutional: Positive for malaise/fatigue. Negative for chills, diaphoresis, fever and weight loss.  HENT: Negative for nosebleeds and sore throat.   Eyes: Negative for double vision.  Respiratory: Positive for shortness of breath. Negative for cough, hemoptysis, sputum production and wheezing.   Cardiovascular: Positive for leg swelling. Negative for chest pain, palpitations and  orthopnea.  Gastrointestinal: Negative for abdominal pain, blood in stool, constipation, diarrhea, heartburn, melena, nausea and vomiting.  Genitourinary: Negative for dysuria, frequency and urgency.  Musculoskeletal: Negative for back pain and joint pain.  Skin: Negative.  Negative for itching and rash.  Neurological: Negative for dizziness, tingling, focal weakness, weakness and headaches.  Endo/Heme/Allergies: Does not bruise/bleed easily.  Psychiatric/Behavioral: Negative for depression. The patient is not nervous/anxious and does not have insomnia.       PAST MEDICAL HISTORY :  Past Medical History:  Diagnosis Date  . Acute renal failure (ARF) ([place]) 05/15/2017  . Anemia   . Benign hypertensive renal disease   . CAD (coronary artery disease)    a. 08/1996 s/p BMS to the mLAD ([facility]); b. 12/2017 MV: EF 46%, fixed apical defect w/ significant GI uptake artifact. No ischemia-> low risk.  . CKD (chronic kidney disease), stage IV ([place])   . Depression   . Diastolic dysfunction    a. 12/2017 Echo: EF 60-65%, no rwma, Gr1 DD, mild AI/MR. Nl RVSP.  [name] Kitchen Dyspnea    with exertion  . FSGS (focal segmental glomerulosclerosis)   . Hyperlipidemia 10/2002  . Hypertension   . Myelofibrosis ([place]) 2010  . Smoker   . Thrombocytopenia ([place])     PAST SURGICAL HISTORY :   Past Surgical History:  Procedure Laterality Date  . ANGIOPLASTY    . BONE MARROW ASPIRATION  03/2009  . CARDIAC CATHETERIZATION    . COLONOSCOPY WITH PROPOFOL N/A 08/09/2018   Procedure: COLONOSCOPY WITH PROPOFOL;  Surgeon: [name], MD;  Location[facility] END[facility]COPY;  Service: Gastroenterology;  Laterality: N/A;  . CORONARY ARTERY BYPASS GRAFT    . CORONARY STENT PLACEMENT  08/1996    FAMILY HISTORY :   Family History  Problem Relation Age of Onset  . Stroke Mother        Low BP stroke  . Depression Father   . Depression Sister        Breast  . Heart disease Unknown   . Breast cancer Unknown   . Lung cancer Unknown   .  Ovarian cancer Unknown   . Stomach cancer Unknown     SOCIAL HISTORY:   Social History   Tobacco Use  . Smoking status: Former Smoker    Packs/day: 1.00    Types: Cigarettes    Last attempt to quit: 01/11/2018    Years since quitting: 0.6  . Smokeless tobacco: Never Used  Substance Use Topics  . Alcohol use: No    Alcohol/week: 0.0 standard drinks    Frequency: Never    Comment: patient states he has not consumed alcohol in the last month  . Drug use: No    ALLERGIES:  has No Known Allergies.  MEDICATIONS:  Current Outpatient Medications  Medication Sig Dispense Refill  . allopurinol (ZYLOPRIM) 100 MG tablet Take 0.5 tablets (50 mg total) by mouth daily. 45 tablet 3  . citalopram (CELEXA) 20 MG tablet Take 1 tablet (20 mg total) by mouth daily. 90 tablet 1  . furosemide (LASIX) 20 MG tablet Take 1 tablet (20 mg total) by mouth daily. 90 tablet 0  . predniSONE (DELTASONE) 10 MG tablet 6 tabs today, 5 tabs tomorrow, decrease by 1 every day until gone. 21 tablet 0  . ruxolitinib phosphate (JAKAFI) 10 MG tablet Take 1 tablet (10 mg total) by mouth 2 (two) times daily. 60 tablet 4  . sodium polystyrene (KAYEXALATE) powder 15 ml every 4-6 hours; until 2 loose stools. 454 g 0  . traMADol (ULTRAM) 50 MG tablet Take 1 tablet (50 mg total) by mouth every 8 (eight) hours as needed. 21 tablet 0  . amLODipine (NORVASC) 10 MG tablet Take 1 tablet (10 mg total) by mouth daily. 90 tablet 1  . carvedilol (COREG) 12.5 MG tablet Take 1 tablet (12.5 mg total) by mouth 2 (two) times daily. 180 tablet 3  . ferrous sulfate 325 (65 FE) MG tablet Take 1 tablet (325 mg total) by mouth daily with breakfast. 30 tablet 1   Current Facility-Administered Medications  Medication Dose Route Frequency Provider Last Rate Last Dose  . epoetin alfa-epbx (RETACRIT) injection 10,000 Units  10,000 Units Subcutaneous Q14 Days Cammie Sickle, MD   10,000 Units at 08/27/18 1605    PHYSICAL EXAMINATION: ECOG  PERFORMANCE STATUS: 1 - Symptomatic but completely ambulatory  BP (!) 151/82   Pulse 64   Temp 98.6 F (37 C) (Tympanic)   Resp 16   Wt 179 lb 9.6 oz (81.5 kg)   BMI 28.13 kg/m   Filed Weights   08/27/18 1531  Weight: 179 lb 9.6 oz (81.5 kg)    Physical Exam  Constitutional: He is oriented to person, place, and time and well-developed, well-nourished, and in no distress.  Accompanied by his wife.  HENT:  Head: Normocephalic and atraumatic.  Mouth/Throat: Oropharynx is clear and moist. No oropharyngeal exudate.  Eyes: Pupils are equal, round, and reactive to light.  Neck: Normal range of motion. Neck supple.  Cardiovascular: Normal rate and regular rhythm.  Pulmonary/Chest: No respiratory distress. He has no wheezes.  Decreased  air entry bilaterally.  Abdominal: Soft. Bowel sounds are normal. He exhibits no distension and no mass. There is no tenderness. There is no rebound and no guarding.  Musculoskeletal: Normal range of motion. He exhibits no edema or tenderness.  Neurological: He is alert and oriented to person, place, and time.  Skin: Skin is warm.  Psychiatric: Affect normal.       LABORATORY DATA:  I have reviewed the data as listed    Component Value Date/Time   NA 141 08/27/2018 1434   NA 134 07/13/2018 1101   K 5.2 (H) 08/27/2018 1434   CL 109 08/27/2018 1434   CO2 22 08/27/2018 1434   GLUCOSE 105 (H) 08/27/2018 1434   BUN 65 (H) 08/27/2018 1434   BUN 57 (H) 07/13/2018 1101   CREATININE 3.58 (H) 08/27/2018 1434   CREATININE 1.12 09/30/2013 0953   CALCIUM 8.9 08/27/2018 1434   PROT 6.9 08/27/2018 1434   PROT 5.9 (L) 07/13/2018 1101   ALBUMIN 4.3 08/27/2018 1434   ALBUMIN 4.1 07/13/2018 1101   AST 15 08/27/2018 1434   ALT 15 08/27/2018 1434   ALKPHOS 41 08/27/2018 1434   BILITOT 0.8 08/27/2018 1434   BILITOT 0.3 07/13/2018 1101   GFRNONAA 17 (L) 08/27/2018 1434   GFRNONAA >60 09/30/2013 0953   GFRAA 20 (L) 08/27/2018 1434   GFRAA >60 09/30/2013  0953    No results found for: SPEP, UPEP  Lab Results  Component Value Date   WBC 4.7 08/27/2018   NEUTROABS 3.9 08/27/2018   HGB 8.4 (L) 08/27/2018   HCT 25.8 (L) 08/27/2018   MCV 88.1 08/27/2018   PLT 104 (L) 08/27/2018      Chemistry      Component Value Date/Time   NA 141 08/27/2018 1434   NA 134 07/13/2018 1101   K 5.2 (H) 08/27/2018 1434   CL 109 08/27/2018 1434   CO2 22 08/27/2018 1434   BUN 65 (H) 08/27/2018 1434   BUN 57 (H) 07/13/2018 1101   CREATININE 3.58 (H) 08/27/2018 1434   CREATININE 1.12 09/30/2013 0953      Component Value Date/Time   CALCIUM 8.9 08/27/2018 1434   ALKPHOS 41 08/27/2018 1434   AST 15 08/27/2018 1434   ALT 15 08/27/2018 1434   BILITOT 0.8 08/27/2018 1434   BILITOT 0.3 07/13/2018 1101       RADIOGRAPHIC STUDIES: I have personally reviewed the radiological images as listed and agreed with the findings in the report. No results found.   ASSESSMENT & PLAN:  Primary myelofibrosis ([place]) # Primary Myelofiborisis [Bone marrow 2010] jak-2 positive;  Currently on jakafi 10 mg BID on October 2nd [? Renal involvement].  Stable.   #Clinically no significant improvement noted either in renal function/symptoms of[continued fatigue]; monitor for now.   #Hyperkalemia potassium-5.2.  Chronic.  Defer to nephrology for further management.  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  Stable.  Continue retacrit every 2 weeks.  #CKD stage V [Dr. Latif] S/p Kidney Biopsy- FSG; stable;not on dialysis.    # DISPOSITION: print out labs # Retacrit injection today.  # in 2 weeks- labs-cbc/bmpretacrit injection # in 4 weeks-labs-cbc/bmp-retacrit.  # Follow-up in 6 weeks;MD labs-cbc/cmp/ldh; retacrit injection-Dr.B.    Cc; Dr.[name]    No orders of the defined types were placed in this encounter.  All questions were answered. The patient knows to call the clinic with any problems, questions or concerns.     Cammie Sickle, MD 08/30/2018  1:18 PM

## 2018-08-27 NOTE — Assessment & Plan Note (Addendum)
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 Renal involvement].  Stable.   #Clinically no significant improvement noted either in renal function/symptoms of[continued fatigue]; monitor for now.   #Hyperkalemia potassium-5.2.  Chronic.  Defer to nephrology for further management.  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  Stable.  Continue retacrit every 2 weeks.  #CKD stage V [Dr. Latif] S/p Kidney Biopsy- FSG; stable;not on dialysis.    # DISPOSITION: print out labs # Retacrit injection today.  # in 2 weeks- labs-cbc/bmpretacrit injection # in 4 weeks-labs-cbc/bmp-retacrit.  # Follow-up in 6 weeks;MD labs-cbc/cmp/ldh; retacrit injection-Dr.B.    Cc; Dr.[name]

## 2018-09-09 MED FILL — JAKAFI 10 MG TABLET: 10 | 30 days supply | Qty: 60 | Fill #3

## 2018-09-10 ENCOUNTER — Inpatient Hospital Stay: Payer: Medicare HMO

## 2018-09-10 ENCOUNTER — Other Ambulatory Visit: Payer: Self-pay | Admitting: *Deleted

## 2018-09-10 VITALS — BP 134/73 | HR 66 | Resp 18

## 2018-09-10 DIAGNOSIS — R918 Other nonspecific abnormal finding of lung field: Secondary | ICD-10-CM | POA: Diagnosis not present

## 2018-09-10 DIAGNOSIS — I2510 Atherosclerotic heart disease of native coronary artery without angina pectoris: Secondary | ICD-10-CM | POA: Diagnosis not present

## 2018-09-10 DIAGNOSIS — D631 Anemia in chronic kidney disease: Secondary | ICD-10-CM

## 2018-09-10 DIAGNOSIS — N183 Chronic kidney disease, stage 3 unspecified: Secondary | ICD-10-CM

## 2018-09-10 DIAGNOSIS — E875 Hyperkalemia: Secondary | ICD-10-CM | POA: Diagnosis not present

## 2018-09-10 DIAGNOSIS — R5383 Other fatigue: Secondary | ICD-10-CM | POA: Diagnosis not present

## 2018-09-10 DIAGNOSIS — D471 Chronic myeloproliferative disease: Secondary | ICD-10-CM

## 2018-09-10 DIAGNOSIS — I120 Hypertensive chronic kidney disease with stage 5 chronic kidney disease or end stage renal disease: Secondary | ICD-10-CM | POA: Diagnosis not present

## 2018-09-10 DIAGNOSIS — R531 Weakness: Secondary | ICD-10-CM | POA: Diagnosis not present

## 2018-09-10 DIAGNOSIS — N185 Chronic kidney disease, stage 5: Secondary | ICD-10-CM | POA: Diagnosis not present

## 2018-09-10 LAB — CBC WITH DIFFERENTIAL/PLATELET
Abs Immature Granulocytes: 0.04 10*3/uL (ref 0.00–0.07)
BASOS ABS: 0 10*3/uL (ref 0.0–0.1)
Basophils Relative: 0 %
EOS ABS: 0 10*3/uL (ref 0.0–0.5)
Eosinophils Relative: 0 %
HEMATOCRIT: 22.8 % — AB (ref 39.0–52.0)
HEMOGLOBIN: 7.5 g/dL — AB (ref 13.0–17.0)
Immature Granulocytes: 1 %
LYMPHS ABS: 0.5 10*3/uL — AB (ref 0.7–4.0)
LYMPHS PCT: 14 %
MCH: 28.4 pg (ref 26.0–34.0)
MCHC: 32.9 g/dL (ref 30.0–36.0)
MCV: 86.4 fL (ref 80.0–100.0)
MONO ABS: 0.1 10*3/uL (ref 0.1–1.0)
Monocytes Relative: 3 %
NRBC: 0.6 % — AB (ref 0.0–0.2)
Neutro Abs: 3 10*3/uL (ref 1.7–7.7)
Neutrophils Relative %: 82 %
Platelets: 81 10*3/uL — ABNORMAL LOW (ref 150–400)
RBC: 2.64 MIL/uL — ABNORMAL LOW (ref 4.22–5.81)
RDW: 19.7 % — AB (ref 11.5–15.5)
WBC: 3.6 10*3/uL — AB (ref 4.0–10.5)

## 2018-09-10 LAB — BASIC METABOLIC PANEL
Anion gap: 10 (ref 5–15)
BUN: 44 mg/dL — AB (ref 8–23)
CALCIUM: 8.4 mg/dL — AB (ref 8.9–10.3)
CHLORIDE: 107 mmol/L (ref 98–111)
CO2: 22 mmol/L (ref 22–32)
Creatinine, Ser: 3.45 mg/dL — ABNORMAL HIGH (ref 0.61–1.24)
GFR calc Af Amer: 21 mL/min — ABNORMAL LOW (ref >60)
GFR calc non Af Amer: 18 mL/min — ABNORMAL LOW (ref >60)
GLUCOSE: 104 mg/dL — AB (ref 70–99)
Potassium: 4.6 mmol/L (ref 3.5–5.1)
Sodium: 139 mmol/L (ref 135–145)

## 2018-09-10 MED ORDER — EPOETIN ALFA-EPBX 10000 UNIT/ML IJ SOLN
10000.0000 [IU] | INTRAMUSCULAR | Status: DC
  Administered 2018-09-10: 10000 [IU] via SUBCUTANEOUS
  Filled 2018-09-10: qty 1

## 2018-09-23 DIAGNOSIS — D631 Anemia in chronic kidney disease: Secondary | ICD-10-CM | POA: Diagnosis not present

## 2018-09-23 DIAGNOSIS — I10 Essential (primary) hypertension: Secondary | ICD-10-CM | POA: Diagnosis not present

## 2018-09-23 DIAGNOSIS — N184 Chronic kidney disease, stage 4 (severe): Secondary | ICD-10-CM | POA: Diagnosis not present

## 2018-09-23 DIAGNOSIS — N2581 Secondary hyperparathyroidism of renal origin: Secondary | ICD-10-CM | POA: Diagnosis not present

## 2018-09-24 ENCOUNTER — Inpatient Hospital Stay: Payer: Medicare HMO

## 2018-09-24 ENCOUNTER — Inpatient Hospital Stay: Payer: Medicare HMO | Attending: Internal Medicine

## 2018-09-24 VITALS — BP 142/74 | HR 62

## 2018-09-24 DIAGNOSIS — M7989 Other specified soft tissue disorders: Secondary | ICD-10-CM | POA: Insufficient documentation

## 2018-09-24 DIAGNOSIS — Z801 Family history of malignant neoplasm of trachea, bronchus and lung: Secondary | ICD-10-CM | POA: Diagnosis not present

## 2018-09-24 DIAGNOSIS — I10 Essential (primary) hypertension: Secondary | ICD-10-CM | POA: Diagnosis not present

## 2018-09-24 DIAGNOSIS — N183 Chronic kidney disease, stage 3 unspecified: Secondary | ICD-10-CM

## 2018-09-24 DIAGNOSIS — D471 Chronic myeloproliferative disease: Secondary | ICD-10-CM | POA: Insufficient documentation

## 2018-09-24 DIAGNOSIS — E785 Hyperlipidemia, unspecified: Secondary | ICD-10-CM | POA: Insufficient documentation

## 2018-09-24 DIAGNOSIS — F329 Major depressive disorder, single episode, unspecified: Secondary | ICD-10-CM | POA: Diagnosis not present

## 2018-09-24 DIAGNOSIS — E875 Hyperkalemia: Secondary | ICD-10-CM | POA: Insufficient documentation

## 2018-09-24 DIAGNOSIS — Z87891 Personal history of nicotine dependence: Secondary | ICD-10-CM | POA: Diagnosis not present

## 2018-09-24 DIAGNOSIS — Z8041 Family history of malignant neoplasm of ovary: Secondary | ICD-10-CM | POA: Diagnosis not present

## 2018-09-24 DIAGNOSIS — Z79899 Other long term (current) drug therapy: Secondary | ICD-10-CM | POA: Diagnosis not present

## 2018-09-24 DIAGNOSIS — N185 Chronic kidney disease, stage 5: Secondary | ICD-10-CM | POA: Diagnosis not present

## 2018-09-24 DIAGNOSIS — D631 Anemia in chronic kidney disease: Secondary | ICD-10-CM

## 2018-09-24 DIAGNOSIS — R0602 Shortness of breath: Secondary | ICD-10-CM | POA: Insufficient documentation

## 2018-09-24 DIAGNOSIS — Z800 Family history of malignant neoplasm of digestive organs: Secondary | ICD-10-CM | POA: Diagnosis not present

## 2018-09-24 DIAGNOSIS — I132 Hypertensive heart and chronic kidney disease with heart failure and with stage 5 chronic kidney disease, or end stage renal disease: Secondary | ICD-10-CM | POA: Insufficient documentation

## 2018-09-24 DIAGNOSIS — Z803 Family history of malignant neoplasm of breast: Secondary | ICD-10-CM | POA: Diagnosis not present

## 2018-09-24 DIAGNOSIS — I2510 Atherosclerotic heart disease of native coronary artery without angina pectoris: Secondary | ICD-10-CM | POA: Diagnosis not present

## 2018-09-24 LAB — CBC WITH DIFFERENTIAL/PLATELET
Abs Immature Granulocytes: 0.04 10*3/uL (ref 0.00–0.07)
BASOS ABS: 0 10*3/uL (ref 0.0–0.1)
Basophils Relative: 0 %
Eosinophils Absolute: 0 10*3/uL (ref 0.0–0.5)
Eosinophils Relative: 0 %
HCT: 26.1 % — ABNORMAL LOW (ref 39.0–52.0)
Hemoglobin: 8.4 g/dL — ABNORMAL LOW (ref 13.0–17.0)
IMMATURE GRANULOCYTES: 1 %
LYMPHS ABS: 0.6 10*3/uL — AB (ref 0.7–4.0)
Lymphocytes Relative: 16 %
MCH: 28.4 pg (ref 26.0–34.0)
MCHC: 32.2 g/dL (ref 30.0–36.0)
MCV: 88.2 fL (ref 80.0–100.0)
MONOS PCT: 4 %
Monocytes Absolute: 0.2 10*3/uL (ref 0.1–1.0)
NRBC: 0.8 % — AB (ref 0.0–0.2)
Neutro Abs: 2.8 10*3/uL (ref 1.7–7.7)
Neutrophils Relative %: 79 %
Platelets: 94 10*3/uL — ABNORMAL LOW (ref 150–400)
RBC: 2.96 MIL/uL — ABNORMAL LOW (ref 4.22–5.81)
RDW: 20.4 % — ABNORMAL HIGH (ref 11.5–15.5)
WBC: 3.5 10*3/uL — ABNORMAL LOW (ref 4.0–10.5)

## 2018-09-24 LAB — BASIC METABOLIC PANEL
Anion gap: 9 (ref 5–15)
BUN: 44 mg/dL — ABNORMAL HIGH (ref 8–23)
CO2: 22 mmol/L (ref 22–32)
Calcium: 8.7 mg/dL — ABNORMAL LOW (ref 8.9–10.3)
Chloride: 109 mmol/L (ref 98–111)
Creatinine, Ser: 3.3 mg/dL — ABNORMAL HIGH (ref 0.61–1.24)
GFR calc Af Amer: 22 mL/min — ABNORMAL LOW (ref >60)
GFR calc non Af Amer: 19 mL/min — ABNORMAL LOW (ref >60)
Glucose, Bld: 101 mg/dL — ABNORMAL HIGH (ref 70–99)
Potassium: 5 mmol/L (ref 3.5–5.1)
Sodium: 140 mmol/L (ref 135–145)

## 2018-09-24 MED ORDER — EPOETIN ALFA-EPBX 10000 UNIT/ML IJ SOLN
10000.0000 [IU] | INTRAMUSCULAR | Status: DC
  Administered 2018-09-24: 10000 [IU] via SUBCUTANEOUS
  Filled 2018-09-24: qty 1

## 2018-10-07 MED FILL — JAKAFI 10 MG TABLET: 10 | 30 days supply | Qty: 60 | Fill #4

## 2018-10-08 ENCOUNTER — Other Ambulatory Visit: Payer: Self-pay

## 2018-10-08 ENCOUNTER — Inpatient Hospital Stay: Payer: Medicare HMO

## 2018-10-08 ENCOUNTER — Encounter: Payer: Self-pay | Admitting: Internal Medicine

## 2018-10-08 ENCOUNTER — Inpatient Hospital Stay (HOSPITAL_BASED_OUTPATIENT_CLINIC_OR_DEPARTMENT_OTHER): Payer: Medicare HMO | Admitting: Internal Medicine

## 2018-10-08 DIAGNOSIS — N185 Chronic kidney disease, stage 5: Secondary | ICD-10-CM | POA: Diagnosis not present

## 2018-10-08 DIAGNOSIS — I10 Essential (primary) hypertension: Secondary | ICD-10-CM

## 2018-10-08 DIAGNOSIS — D471 Chronic myeloproliferative disease: Secondary | ICD-10-CM

## 2018-10-08 DIAGNOSIS — E875 Hyperkalemia: Secondary | ICD-10-CM | POA: Diagnosis not present

## 2018-10-08 DIAGNOSIS — I132 Hypertensive heart and chronic kidney disease with heart failure and with stage 5 chronic kidney disease, or end stage renal disease: Secondary | ICD-10-CM | POA: Diagnosis not present

## 2018-10-08 DIAGNOSIS — I2510 Atherosclerotic heart disease of native coronary artery without angina pectoris: Secondary | ICD-10-CM | POA: Diagnosis not present

## 2018-10-08 DIAGNOSIS — E785 Hyperlipidemia, unspecified: Secondary | ICD-10-CM

## 2018-10-08 DIAGNOSIS — N183 Chronic kidney disease, stage 3 (moderate): Principal | ICD-10-CM

## 2018-10-08 DIAGNOSIS — D631 Anemia in chronic kidney disease: Secondary | ICD-10-CM

## 2018-10-08 DIAGNOSIS — Z87891 Personal history of nicotine dependence: Secondary | ICD-10-CM

## 2018-10-08 DIAGNOSIS — Z801 Family history of malignant neoplasm of trachea, bronchus and lung: Secondary | ICD-10-CM

## 2018-10-08 DIAGNOSIS — F329 Major depressive disorder, single episode, unspecified: Secondary | ICD-10-CM | POA: Diagnosis not present

## 2018-10-08 DIAGNOSIS — Z8041 Family history of malignant neoplasm of ovary: Secondary | ICD-10-CM

## 2018-10-08 DIAGNOSIS — M7989 Other specified soft tissue disorders: Secondary | ICD-10-CM | POA: Diagnosis not present

## 2018-10-08 DIAGNOSIS — Z79899 Other long term (current) drug therapy: Secondary | ICD-10-CM | POA: Diagnosis not present

## 2018-10-08 DIAGNOSIS — R0602 Shortness of breath: Secondary | ICD-10-CM

## 2018-10-08 DIAGNOSIS — Z803 Family history of malignant neoplasm of breast: Secondary | ICD-10-CM

## 2018-10-08 DIAGNOSIS — Z800 Family history of malignant neoplasm of digestive organs: Secondary | ICD-10-CM

## 2018-10-08 LAB — COMPREHENSIVE METABOLIC PANEL
ALT: 15 U/L (ref 0–44)
AST: 13 U/L — ABNORMAL LOW (ref 15–41)
Albumin: 4 g/dL (ref 3.5–5.0)
Alkaline Phosphatase: 32 U/L — ABNORMAL LOW (ref 38–126)
Anion gap: 7 (ref 5–15)
BILIRUBIN TOTAL: 0.6 mg/dL (ref 0.3–1.2)
BUN: 58 mg/dL — ABNORMAL HIGH (ref 8–23)
CO2: 25 mmol/L (ref 22–32)
Calcium: 8.6 mg/dL — ABNORMAL LOW (ref 8.9–10.3)
Chloride: 108 mmol/L (ref 98–111)
Creatinine, Ser: 4.25 mg/dL — ABNORMAL HIGH (ref 0.61–1.24)
GFR calc Af Amer: 16 mL/min — ABNORMAL LOW (ref >60)
GFR calc non Af Amer: 14 mL/min — ABNORMAL LOW (ref >60)
Glucose, Bld: 88 mg/dL (ref 70–99)
Potassium: 4.4 mmol/L (ref 3.5–5.1)
Sodium: 140 mmol/L (ref 135–145)
Total Protein: 6.4 g/dL — ABNORMAL LOW (ref 6.5–8.1)

## 2018-10-08 LAB — CBC WITH DIFFERENTIAL/PLATELET
Abs Immature Granulocytes: 0.02 10*3/uL (ref 0.00–0.07)
Basophils Absolute: 0 10*3/uL (ref 0.0–0.1)
Basophils Relative: 0 %
Eosinophils Absolute: 0 10*3/uL (ref 0.0–0.5)
Eosinophils Relative: 0 %
HCT: 27 % — ABNORMAL LOW (ref 39.0–52.0)
Hemoglobin: 8.8 g/dL — ABNORMAL LOW (ref 13.0–17.0)
Immature Granulocytes: 1 %
Lymphocytes Relative: 27 %
Lymphs Abs: 0.9 10*3/uL (ref 0.7–4.0)
MCH: 29.1 pg (ref 26.0–34.0)
MCHC: 32.6 g/dL (ref 30.0–36.0)
MCV: 89.4 fL (ref 80.0–100.0)
Monocytes Absolute: 0.1 10*3/uL (ref 0.1–1.0)
Monocytes Relative: 4 %
NRBC: 1.6 % — AB (ref 0.0–0.2)
Neutro Abs: 2.2 10*3/uL (ref 1.7–7.7)
Neutrophils Relative %: 68 %
Platelets: 89 10*3/uL — ABNORMAL LOW (ref 150–400)
RBC: 3.02 MIL/uL — ABNORMAL LOW (ref 4.22–5.81)
RDW: 19.6 % — ABNORMAL HIGH (ref 11.5–15.5)
WBC: 3.2 10*3/uL — ABNORMAL LOW (ref 4.0–10.5)

## 2018-10-08 LAB — LACTATE DEHYDROGENASE: LDH: 412 U/L — ABNORMAL HIGH (ref 98–192)

## 2018-10-08 MED ORDER — TRAMADOL HCL 50 MG PO TABS: 50.0000 mg | ORAL_TABLET | Freq: Three times a day (TID) | ORAL | 0 refills | Status: DC | PRN

## 2018-10-08 MED ORDER — EPOETIN ALFA-EPBX 10000 UNIT/ML IJ SOLN
10000.0000 [IU] | INTRAMUSCULAR | Status: DC
  Administered 2018-10-08: 10000 [IU] via SUBCUTANEOUS
  Filled 2018-10-08: qty 1

## 2018-10-08 NOTE — Assessment & Plan Note (Addendum)
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 Renal involvement].  Stable.   # continue Jakafi for now; at the current dose.  #Clinically no significant improvement noted either in renal function/symptoms of[continued fatigue-? CKD]; monitor for now.   #Hyperkalemia potassium-improved.  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  Stable.  Continue retacrit every 2 weeks.  #CKD stage V [Dr. Latif] S/p Kidney Biopsy- FSG; worsening- not on HD GFR ~14..  Discussed with Dr. Zollie Scale; recommend follow-up sooner.  # DISPOSITION: print out labs # Retacrit injection today.  # in 2 weeks- labs-cbc/bmp-retacrit injection # in 4 weeks-labs-cbc/bmp-retacrit.  # Follow-up in 6 weeks;MD labs-cbc/cmp/ldh; retacrit injection-Dr.B.    Cc; Dr.[name]

## 2018-10-08 NOTE — Progress Notes (Signed)
[place] OFFICE PROGRESS NOTE  Patient Care Team: Valerie Roys, DO as PCP - General (Family Medicine) End, Harrell Gave, MD as PCP - Cardiology (Cardiology) Cammie Sickle, MD as Consulting Physician (Internal Medicine)  Cancer Staging No matching staging information was found for the patient.   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 [name]- kidney Bx- FSG [ on Prednisone Dr.[name]];   # AUGUST 1st week 2019- START ARANESP 300 mcg q 4W  # October 2nd- jakafi 10 BID [? Renal involvement]  # hx of Lung nodules- resolved [[name]]/quit smoking.  DIAGNOSIS: PRIMARY MYELOFIBROSIS  RISK:LOW     ;GOALS: Control  CURRENT/MOST RECENT THERAPY : Jakafi       Primary myelofibrosis ([place])      INTERVAL HISTORY:  James Arnold 63 y.o.  male pleasant patient above history of primary myelofibrosis Jak 2+ currently on Jakafi 10 mg BID [renally dosed] and also chronic kidney disease/FSG on prednisone is here for follow-up.   Patient continues to feel poorly.  Complains of worsening fatigue.  He continues to have prednisone 10 mg a day.  No bleeding no easy bruising.  Intermittent swelling in the legs.   Review of Systems  Constitutional: Positive for malaise/fatigue. Negative for chills, diaphoresis, fever and weight loss.  HENT: Negative for nosebleeds and sore throat.   Eyes: Negative for double vision.  Respiratory: Positive for shortness of breath. Negative for cough, hemoptysis, sputum production and wheezing.   Cardiovascular: Positive for leg swelling. Negative for chest pain, palpitations and orthopnea.  Gastrointestinal:  Negative for abdominal pain, blood in stool, constipation, diarrhea, heartburn, melena, nausea and vomiting.  Genitourinary: Negative for dysuria, frequency and urgency.  Musculoskeletal: Negative for back pain and joint pain.  Skin: Negative.  Negative for itching and rash.  Neurological: Negative for dizziness, tingling, focal weakness, weakness and headaches.  Endo/Heme/Allergies: Does not bruise/bleed easily.  Psychiatric/Behavioral: Negative for depression. The patient is not nervous/anxious and does not have insomnia.       PAST MEDICAL HISTORY :  Past Medical History:  Diagnosis Date  . Acute renal failure (ARF) ([place]) 05/15/2017  . Anemia   . Benign hypertensive renal disease   . CAD (coronary artery disease)    a. 08/1996 s/p BMS to the mLAD ([facility]); b. 12/2017 MV: EF 46%, fixed apical defect w/ significant GI uptake artifact. No ischemia-> low risk.  . CKD (chronic kidney disease), stage IV ([place])   . Depression   . Diastolic dysfunction    a. 12/2017 Echo: EF 60-65%, no rwma, Gr1 DD, mild AI/MR. Nl RVSP.  [name] Kitchen Dyspnea    with exertion  . FSGS (focal segmental glomerulosclerosis)   . Hyperlipidemia 10/2002  . Hypertension   . Myelofibrosis ([place]) 2010  . Smoker   . Thrombocytopenia ([place])     PAST SURGICAL HISTORY :   Past Surgical History:  Procedure Laterality Date  . ANGIOPLASTY    . BONE MARROW ASPIRATION  03/2009  . CARDIAC CATHETERIZATION    . COLONOSCOPY WITH PROPOFOL N/A 08/09/2018   Procedure: COLONOSCOPY WITH PROPOFOL;  Surgeon: [name], MD;  Location: [facility] ENDOSCOPY;  Service: Gastroenterology;  Laterality: N/A;  . CORONARY ARTERY BYPASS GRAFT    .  CORONARY STENT PLACEMENT  08/1996    FAMILY HISTORY :   Family History  Problem Relation Age of Onset  . Stroke Mother        Low BP stroke  . Depression Father   . Depression Sister        Breast  . Heart disease Other   . Breast cancer Other   . Lung cancer Other   . Ovarian cancer Other   . Stomach  cancer Other     SOCIAL HISTORY:   Social History   Tobacco Use  . Smoking status: Former Smoker    Packs/day: 1.00    Types: Cigarettes    Last attempt to quit: 01/11/2018    Years since quitting: 0.7  . Smokeless tobacco: Never Used  Substance Use Topics  . Alcohol use: No    Alcohol/week: 0.0 standard drinks    Frequency: Never    Comment: patient states he has not consumed alcohol in the last month  . Drug use: No    ALLERGIES:  has No Known Allergies.  MEDICATIONS:  Current Outpatient Medications  Medication Sig Dispense Refill  . allopurinol (ZYLOPRIM) 100 MG tablet Take 0.5 tablets (50 mg total) by mouth daily. 45 tablet 3  . amLODipine (NORVASC) 10 MG tablet Take 1 tablet (10 mg total) by mouth daily. 90 tablet 1  . carvedilol (COREG) 12.5 MG tablet Take 1 tablet (12.5 mg total) by mouth 2 (two) times daily. 180 tablet 3  . citalopram (CELEXA) 20 MG tablet Take 1 tablet (20 mg total) by mouth daily. 90 tablet 1  . ferrous sulfate 325 (65 FE) MG tablet Take 1 tablet (325 mg total) by mouth daily with breakfast. 30 tablet 1  . furosemide (LASIX) 20 MG tablet Take 1 tablet (20 mg total) by mouth daily. 90 tablet 0  . predniSONE (DELTASONE) 10 MG tablet 6 tabs today, 5 tabs tomorrow, decrease by 1 every day until gone. 21 tablet 0  . ruxolitinib phosphate (JAKAFI) 10 MG tablet Take 1 tablet (10 mg total) by mouth 2 (two) times daily. 60 tablet 4  . sodium polystyrene (KAYEXALATE) powder 15 ml every 4-6 hours; until 2 loose stools. 454 g 0  . traMADol (ULTRAM) 50 MG tablet Take 1 tablet (50 mg total) by mouth every 8 (eight) hours as needed. 21 tablet 0   No current facility-administered medications for this visit.     PHYSICAL EXAMINATION: ECOG PERFORMANCE STATUS: 1 - Symptomatic but completely ambulatory  BP (!) 147/76 (BP Location: Left Arm, Patient Position: Sitting)   Pulse 65   Temp (!) 97 F (36.1 C) (Tympanic)   Resp 12   Ht 5' 6" (1.676 m)   Wt 182 lb 6.4  oz (82.7 kg)   BMI 29.44 kg/m   Filed Weights   10/08/18 1439  Weight: 182 lb 6.4 oz (82.7 kg)    Physical Exam  Constitutional: He is oriented to person, place, and time and well-developed, well-nourished, and in no distress.  Accompanied by his wife.  HENT:  Head: Normocephalic and atraumatic.  Mouth/Throat: Oropharynx is clear and moist. No oropharyngeal exudate.  Eyes: Pupils are equal, round, and reactive to light.  Neck: Normal range of motion. Neck supple.  Cardiovascular: Normal rate and regular rhythm.  Pulmonary/Chest: No respiratory distress. He has no wheezes.  Decreased air entry bilaterally.  Abdominal: Soft. Bowel sounds are normal. He exhibits no distension and no mass. There is no abdominal tenderness. There is no rebound and  no guarding.  Musculoskeletal: Normal range of motion.        General: No tenderness or edema.  Neurological: He is alert and oriented to person, place, and time.  Skin: Skin is warm.  Psychiatric: Affect normal.       LABORATORY DATA:  I have reviewed the data as listed    Component Value Date/Time   NA 140 10/08/2018 1429   NA 134 07/13/2018 1101   K 4.4 10/08/2018 1429   CL 108 10/08/2018 1429   CO2 25 10/08/2018 1429   GLUCOSE 88 10/08/2018 1429   BUN 58 (H) 10/08/2018 1429   BUN 57 (H) 07/13/2018 1101   CREATININE 4.25 (H) 10/08/2018 1429   CREATININE 1.12 09/30/2013 0953   CALCIUM 8.6 (L) 10/08/2018 1429   PROT 6.4 (L) 10/08/2018 1429   PROT 5.9 (L) 07/13/2018 1101   ALBUMIN 4.0 10/08/2018 1429   ALBUMIN 4.1 07/13/2018 1101   AST 13 (L) 10/08/2018 1429   ALT 15 10/08/2018 1429   ALKPHOS 32 (L) 10/08/2018 1429   BILITOT 0.6 10/08/2018 1429   BILITOT 0.3 07/13/2018 1101   GFRNONAA 14 (L) 10/08/2018 1429   GFRNONAA >60 09/30/2013 0953   GFRAA 16 (L) 10/08/2018 1429   GFRAA >60 09/30/2013 0953    No results found for: SPEP, UPEP  Lab Results  Component Value Date   WBC 3.2 (L) 10/08/2018   NEUTROABS 2.2  10/08/2018   HGB 8.8 (L) 10/08/2018   HCT 27.0 (L) 10/08/2018   MCV 89.4 10/08/2018   PLT 89 (L) 10/08/2018      Chemistry      Component Value Date/Time   NA 140 10/08/2018 1429   NA 134 07/13/2018 1101   K 4.4 10/08/2018 1429   CL 108 10/08/2018 1429   CO2 25 10/08/2018 1429   BUN 58 (H) 10/08/2018 1429   BUN 57 (H) 07/13/2018 1101   CREATININE 4.25 (H) 10/08/2018 1429   CREATININE 1.12 09/30/2013 0953      Component Value Date/Time   CALCIUM 8.6 (L) 10/08/2018 1429   ALKPHOS 32 (L) 10/08/2018 1429   AST 13 (L) 10/08/2018 1429   ALT 15 10/08/2018 1429   BILITOT 0.6 10/08/2018 1429   BILITOT 0.3 07/13/2018 1101       RADIOGRAPHIC STUDIES: I have personally reviewed the radiological images as listed and agreed with the findings in the report. No results found.   ASSESSMENT & PLAN:  Primary myelofibrosis ([place]) # Primary Myelofiborisis [Bone marrow 2010] jak-2 positive;  Currently on jakafi 10 mg BID on October 2nd [? Renal involvement].  Stable.   # continue Jakafi for now; at the current dose.  #Clinically no significant improvement noted either in renal function/symptoms of[continued fatigue-? CKD]; monitor for now.   #Hyperkalemia potassium-improved.  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  Stable.  Continue retacrit every 2 weeks.  #CKD stage V [Dr. Latif] S/p Kidney Biopsy- FSG; worsening- not on HD GFR ~14..  Discussed with Dr. Zollie Scale; recommend follow-up sooner.  # DISPOSITION: print out labs # Retacrit injection today.  # in 2 weeks- labs-cbc/bmp-retacrit injection # in 4 weeks-labs-cbc/bmp-retacrit.  # Follow-up in 6 weeks;MD labs-cbc/cmp/ldh; retacrit injection-Dr.B.    Cc; Dr.[name]    No orders of the defined types were placed in this encounter.  All questions were answered. The patient knows to call the clinic with any problems, questions or concerns.     Cammie Sickle, MD 10/11/2018 12:44 PM

## 2018-10-08 NOTE — Progress Notes (Signed)
Patient here for follow up. He reports feeling tired all the time, headaches and new onset back pain.

## 2018-10-11 ENCOUNTER — Other Ambulatory Visit: Payer: Self-pay | Admitting: *Deleted

## 2018-10-11 ENCOUNTER — Telehealth: Payer: Self-pay | Admitting: *Deleted

## 2018-10-11 DIAGNOSIS — N183 Chronic kidney disease, stage 3 unspecified: Secondary | ICD-10-CM

## 2018-10-11 DIAGNOSIS — N041 Nephrotic syndrome with focal and segmental glomerular lesions: Secondary | ICD-10-CM | POA: Diagnosis not present

## 2018-10-11 DIAGNOSIS — R809 Proteinuria, unspecified: Secondary | ICD-10-CM | POA: Diagnosis not present

## 2018-10-11 NOTE — Telephone Encounter (Signed)
Call from wife reporting that Dr Holley Raring did not get the referral from [place] and told her they are not on Epic . She asks that we send referral ASAP as he is not feeling well at all

## 2018-10-11 NOTE — Telephone Encounter (Signed)
Colette, please schedule apts with Dr. Holley Raring. Thanks.

## 2018-10-14 DIAGNOSIS — N183 Chronic kidney disease, stage 3 (moderate): Secondary | ICD-10-CM | POA: Diagnosis not present

## 2018-10-14 DIAGNOSIS — N041 Nephrotic syndrome with focal and segmental glomerular lesions: Secondary | ICD-10-CM | POA: Diagnosis not present

## 2018-10-14 DIAGNOSIS — R809 Proteinuria, unspecified: Secondary | ICD-10-CM | POA: Diagnosis not present

## 2018-10-22 ENCOUNTER — Inpatient Hospital Stay: Payer: Medicare HMO

## 2018-10-22 ENCOUNTER — Other Ambulatory Visit: Payer: Self-pay | Admitting: *Deleted

## 2018-10-22 VITALS — BP 126/76

## 2018-10-22 DIAGNOSIS — F329 Major depressive disorder, single episode, unspecified: Secondary | ICD-10-CM | POA: Diagnosis not present

## 2018-10-22 DIAGNOSIS — N183 Chronic kidney disease, stage 3 unspecified: Secondary | ICD-10-CM

## 2018-10-22 DIAGNOSIS — E875 Hyperkalemia: Secondary | ICD-10-CM | POA: Diagnosis not present

## 2018-10-22 DIAGNOSIS — I132 Hypertensive heart and chronic kidney disease with heart failure and with stage 5 chronic kidney disease, or end stage renal disease: Secondary | ICD-10-CM | POA: Diagnosis not present

## 2018-10-22 DIAGNOSIS — D631 Anemia in chronic kidney disease: Secondary | ICD-10-CM

## 2018-10-22 DIAGNOSIS — N185 Chronic kidney disease, stage 5: Secondary | ICD-10-CM | POA: Diagnosis not present

## 2018-10-22 DIAGNOSIS — I2510 Atherosclerotic heart disease of native coronary artery without angina pectoris: Secondary | ICD-10-CM | POA: Diagnosis not present

## 2018-10-22 DIAGNOSIS — Z79899 Other long term (current) drug therapy: Secondary | ICD-10-CM | POA: Diagnosis not present

## 2018-10-22 DIAGNOSIS — D471 Chronic myeloproliferative disease: Secondary | ICD-10-CM | POA: Diagnosis not present

## 2018-10-22 DIAGNOSIS — M7989 Other specified soft tissue disorders: Secondary | ICD-10-CM | POA: Diagnosis not present

## 2018-10-22 LAB — BASIC METABOLIC PANEL
Anion gap: 10 (ref 5–15)
BUN: 53 mg/dL — AB (ref 8–23)
CO2: 20 mmol/L — ABNORMAL LOW (ref 22–32)
Calcium: 8.7 mg/dL — ABNORMAL LOW (ref 8.9–10.3)
Chloride: 110 mmol/L (ref 98–111)
Creatinine, Ser: 3.54 mg/dL — ABNORMAL HIGH (ref 0.61–1.24)
GFR calc Af Amer: 20 mL/min — ABNORMAL LOW (ref >60)
GFR, EST NON AFRICAN AMERICAN: 17 mL/min — AB (ref >60)
Glucose, Bld: 101 mg/dL — ABNORMAL HIGH (ref 70–99)
Potassium: 4.9 mmol/L (ref 3.5–5.1)
Sodium: 140 mmol/L (ref 135–145)

## 2018-10-22 LAB — CBC WITH DIFFERENTIAL/PLATELET
Abs Immature Granulocytes: 0.02 10*3/uL (ref 0.00–0.07)
Basophils Absolute: 0 10*3/uL (ref 0.0–0.1)
Basophils Relative: 0 %
Eosinophils Absolute: 0 10*3/uL (ref 0.0–0.5)
Eosinophils Relative: 0 %
HCT: 25.3 % — ABNORMAL LOW (ref 39.0–52.0)
Hemoglobin: 8.3 g/dL — ABNORMAL LOW (ref 13.0–17.0)
Immature Granulocytes: 1 %
Lymphocytes Relative: 18 %
Lymphs Abs: 0.5 10*3/uL — ABNORMAL LOW (ref 0.7–4.0)
MCH: 28.9 pg (ref 26.0–34.0)
MCHC: 32.8 g/dL (ref 30.0–36.0)
MCV: 88.2 fL (ref 80.0–100.0)
MONO ABS: 0.1 10*3/uL (ref 0.1–1.0)
Monocytes Relative: 3 %
Neutro Abs: 2.3 10*3/uL (ref 1.7–7.7)
Neutrophils Relative %: 78 %
Platelets: 61 10*3/uL — ABNORMAL LOW (ref 150–400)
RBC: 2.87 MIL/uL — ABNORMAL LOW (ref 4.22–5.81)
RDW: 17.7 % — ABNORMAL HIGH (ref 11.5–15.5)
WBC: 2.9 10*3/uL — ABNORMAL LOW (ref 4.0–10.5)
nRBC: 1 % — ABNORMAL HIGH (ref 0.0–0.2)

## 2018-10-22 MED ORDER — EPOETIN ALFA-EPBX 10000 UNIT/ML IJ SOLN
10000.0000 [IU] | INTRAMUSCULAR | Status: DC
  Administered 2018-10-22: 10000 [IU] via SUBCUTANEOUS
  Filled 2018-10-22: qty 1

## 2018-10-24 DIAGNOSIS — F32A Depression, unspecified: Secondary | ICD-10-CM | POA: Insufficient documentation

## 2018-10-24 DIAGNOSIS — N400 Enlarged prostate without lower urinary tract symptoms: Secondary | ICD-10-CM | POA: Insufficient documentation

## 2018-10-27 DIAGNOSIS — N051 Unspecified nephritic syndrome with focal and segmental glomerular lesions: Secondary | ICD-10-CM | POA: Diagnosis not present

## 2018-10-27 DIAGNOSIS — Z4902 Encounter for fitting and adjustment of peritoneal dialysis catheter: Secondary | ICD-10-CM | POA: Diagnosis not present

## 2018-10-27 DIAGNOSIS — N185 Chronic kidney disease, stage 5: Secondary | ICD-10-CM | POA: Diagnosis not present

## 2018-11-02 ENCOUNTER — Other Ambulatory Visit: Payer: Self-pay | Admitting: Internal Medicine

## 2018-11-02 DIAGNOSIS — D471 Chronic myeloproliferative disease: Secondary | ICD-10-CM

## 2018-11-02 NOTE — Telephone Encounter (Signed)
stage I...   Ref Range & Units 11d ago  WBC 4.0 - 10.5 K/uL 2.9Low    RBC 4.22 - 5.81 MIL/uL 2.87Low    Hemoglobin 13.0 - 17.0 g/dL 8.3Low    HCT 39.0 - 52.0 % 25.3Low    MCV 80.0 - 100.0 fL 88.2   MCH 26.0 - 34.0 pg 28.9   MCHC 30.0 - 36.0 g/dL 32.8   RDW 11.5 - 15.5 % 17.7High    Platelets 150 - 400 K/uL 61Low    nRBC 0.0 - 0.2 % 1.0High    Neutrophils Relative % % 78   Neutro Abs 1.7 - 7.7 K/uL 2.3   Lymphocytes Relative % 18   Lymphs Abs 0.7 - 4.0 K/uL 0.5Low    Monocytes Relative % 3   Monocytes Absolute 0.1 - 1.0 K/uL 0.1   Eosinophils Relative % 0   Eosinophils Absolute 0.0 - 0.5 K/uL 0.0   Basophils Relative % 0   Basophils Absolute 0.0 - 0.1 K/uL 0.0   Immature Granulocytes % 1   Abs Immature Granulocytes 0.00 - 0.07 K/uL 0.02   Comment: Performed at [facility], [address] [place], [place] [address]  Resulting Agency  [facility] LAB      Specimen Collected: 10/22/18 10:32  Last Resulted: 10/22/18 10:54     Lab Flowsheet    Order Details    View Encounter    Lab and Collection Details    Routing    Result History          Other Results from 10/22/2018   Contains abnormal data Basic metabolic panel  Order: [id number]   Status:  Final result  Visible to patient:  Yes (MyChart)  Next appt:  11/05/2018 at 10:30 AM in Oncology ([facility])  Dx:  CKD (chronic kidney disease), stage I...   Ref Range & Units 11d ago  Sodium 135 - 145 mmol/L 140   Potassium 3.5 - 5.1 mmol/L 4.9   Chloride 98 - 111 mmol/L 110   CO2 22 - 32 mmol/L 20Low    Glucose, Bld 70 - 99 mg/dL 101High    BUN 8 - 23 mg/dL 53High    Creatinine, Ser 0.61 - 1.24 mg/dL 3.54High    Calcium 8.9 - 10.3 mg/dL 8.7Low    GFR calc non Af Amer >60 mL/min 17Low    GFR calc Af Amer >60 mL/min 20Low    Anion gap 5 - 15 10   Comment: Performed at [facility], [address] [place], [place] [address]  Resulting Agency  [facility] CLIN LAB      Specimen Collected: 10/22/18 10:32   Last Resulted: 10/22/18 11:18

## 2018-11-04 DIAGNOSIS — D631 Anemia in chronic kidney disease: Secondary | ICD-10-CM | POA: Diagnosis not present

## 2018-11-04 DIAGNOSIS — N041 Nephrotic syndrome with focal and segmental glomerular lesions: Secondary | ICD-10-CM | POA: Diagnosis not present

## 2018-11-04 DIAGNOSIS — N2581 Secondary hyperparathyroidism of renal origin: Secondary | ICD-10-CM | POA: Diagnosis not present

## 2018-11-04 DIAGNOSIS — E875 Hyperkalemia: Secondary | ICD-10-CM | POA: Diagnosis not present

## 2018-11-04 DIAGNOSIS — I10 Essential (primary) hypertension: Secondary | ICD-10-CM | POA: Diagnosis not present

## 2018-11-05 ENCOUNTER — Inpatient Hospital Stay: Payer: Medicare HMO | Attending: Internal Medicine

## 2018-11-05 ENCOUNTER — Inpatient Hospital Stay: Payer: Medicare HMO

## 2018-11-05 VITALS — BP 135/81 | HR 63 | Resp 18

## 2018-11-05 DIAGNOSIS — N183 Chronic kidney disease, stage 3 unspecified: Secondary | ICD-10-CM

## 2018-11-05 DIAGNOSIS — N185 Chronic kidney disease, stage 5: Secondary | ICD-10-CM | POA: Insufficient documentation

## 2018-11-05 DIAGNOSIS — D631 Anemia in chronic kidney disease: Secondary | ICD-10-CM | POA: Insufficient documentation

## 2018-11-05 LAB — CBC WITH DIFFERENTIAL/PLATELET
Abs Immature Granulocytes: 0.05 10*3/uL (ref 0.00–0.07)
Basophils Absolute: 0 10*3/uL (ref 0.0–0.1)
Basophils Relative: 0 %
Eosinophils Absolute: 0 10*3/uL (ref 0.0–0.5)
Eosinophils Relative: 0 %
HCT: 27.3 % — ABNORMAL LOW (ref 39.0–52.0)
Hemoglobin: 8.9 g/dL — ABNORMAL LOW (ref 13.0–17.0)
Immature Granulocytes: 1 %
Lymphocytes Relative: 18 %
Lymphs Abs: 0.6 10*3/uL — ABNORMAL LOW (ref 0.7–4.0)
MCH: 28.9 pg (ref 26.0–34.0)
MCHC: 32.6 g/dL (ref 30.0–36.0)
MCV: 88.6 fL (ref 80.0–100.0)
MONOS PCT: 5 %
Monocytes Absolute: 0.2 10*3/uL (ref 0.1–1.0)
Neutro Abs: 2.8 10*3/uL (ref 1.7–7.7)
Neutrophils Relative %: 76 %
Platelets: 77 10*3/uL — ABNORMAL LOW (ref 150–400)
RBC: 3.08 MIL/uL — ABNORMAL LOW (ref 4.22–5.81)
RDW: 18.7 % — ABNORMAL HIGH (ref 11.5–15.5)
WBC: 3.6 10*3/uL — ABNORMAL LOW (ref 4.0–10.5)
nRBC: 1.1 % — ABNORMAL HIGH (ref 0.0–0.2)

## 2018-11-05 LAB — BASIC METABOLIC PANEL
Anion gap: 7 (ref 5–15)
BUN: 51 mg/dL — ABNORMAL HIGH (ref 8–23)
CHLORIDE: 106 mmol/L (ref 98–111)
CO2: 24 mmol/L (ref 22–32)
Calcium: 8.7 mg/dL — ABNORMAL LOW (ref 8.9–10.3)
Creatinine, Ser: 3.86 mg/dL — ABNORMAL HIGH (ref 0.61–1.24)
GFR calc Af Amer: 18 mL/min — ABNORMAL LOW (ref >60)
GFR calc non Af Amer: 16 mL/min — ABNORMAL LOW (ref >60)
GLUCOSE: 100 mg/dL — AB (ref 70–99)
Potassium: 4.5 mmol/L (ref 3.5–5.1)
Sodium: 137 mmol/L (ref 135–145)

## 2018-11-05 MED ORDER — EPOETIN ALFA-EPBX 10000 UNIT/ML IJ SOLN
10000.0000 [IU] | INTRAMUSCULAR | Status: DC
  Administered 2018-11-05: 10000 [IU] via SUBCUTANEOUS
  Filled 2018-11-05: qty 1

## 2018-11-08 MED FILL — JAKAFI 10 MG TABLET: 10 | 30 days supply | Qty: 60 | Fill #0

## 2018-11-16 DIAGNOSIS — I120 Hypertensive chronic kidney disease with stage 5 chronic kidney disease or end stage renal disease: Secondary | ICD-10-CM | POA: Diagnosis not present

## 2018-11-16 DIAGNOSIS — N186 End stage renal disease: Secondary | ICD-10-CM | POA: Diagnosis not present

## 2018-11-16 DIAGNOSIS — Z4902 Encounter for fitting and adjustment of peritoneal dialysis catheter: Secondary | ICD-10-CM | POA: Diagnosis not present

## 2018-11-16 DIAGNOSIS — N051 Unspecified nephritic syndrome with focal and segmental glomerular lesions: Secondary | ICD-10-CM | POA: Diagnosis not present

## 2018-11-16 DIAGNOSIS — N185 Chronic kidney disease, stage 5: Secondary | ICD-10-CM | POA: Diagnosis not present

## 2018-11-16 DIAGNOSIS — Z01810 Encounter for preprocedural cardiovascular examination: Secondary | ICD-10-CM | POA: Diagnosis not present

## 2018-11-18 DIAGNOSIS — R809 Proteinuria, unspecified: Secondary | ICD-10-CM | POA: Diagnosis not present

## 2018-11-18 DIAGNOSIS — N183 Chronic kidney disease, stage 3 (moderate): Secondary | ICD-10-CM | POA: Diagnosis not present

## 2018-11-18 DIAGNOSIS — N041 Nephrotic syndrome with focal and segmental glomerular lesions: Secondary | ICD-10-CM | POA: Diagnosis not present

## 2018-11-19 ENCOUNTER — Other Ambulatory Visit: Payer: Self-pay

## 2018-11-19 ENCOUNTER — Inpatient Hospital Stay (HOSPITAL_BASED_OUTPATIENT_CLINIC_OR_DEPARTMENT_OTHER): Payer: Medicare HMO | Admitting: Internal Medicine

## 2018-11-19 ENCOUNTER — Inpatient Hospital Stay: Payer: Medicare HMO

## 2018-11-19 ENCOUNTER — Inpatient Hospital Stay: Payer: Medicare HMO | Attending: Internal Medicine

## 2018-11-19 ENCOUNTER — Encounter: Payer: Self-pay | Admitting: Internal Medicine

## 2018-11-19 ENCOUNTER — Other Ambulatory Visit: Payer: Self-pay | Admitting: *Deleted

## 2018-11-19 VITALS — BP 158/81 | HR 65 | Temp 97.8°F | Wt 185.0 lb

## 2018-11-19 DIAGNOSIS — N183 Chronic kidney disease, stage 3 unspecified: Secondary | ICD-10-CM

## 2018-11-19 DIAGNOSIS — D631 Anemia in chronic kidney disease: Secondary | ICD-10-CM

## 2018-11-19 DIAGNOSIS — N185 Chronic kidney disease, stage 5: Secondary | ICD-10-CM

## 2018-11-19 DIAGNOSIS — D471 Chronic myeloproliferative disease: Secondary | ICD-10-CM

## 2018-11-19 LAB — CBC WITH DIFFERENTIAL/PLATELET
Abs Immature Granulocytes: 0.05 10*3/uL (ref 0.00–0.07)
BASOS ABS: 0 10*3/uL (ref 0.0–0.1)
Basophils Relative: 0 %
Eosinophils Absolute: 0 10*3/uL (ref 0.0–0.5)
Eosinophils Relative: 0 %
HCT: 26.1 % — ABNORMAL LOW (ref 39.0–52.0)
Hemoglobin: 8.7 g/dL — ABNORMAL LOW (ref 13.0–17.0)
Immature Granulocytes: 1 %
Lymphocytes Relative: 15 %
Lymphs Abs: 0.6 10*3/uL — ABNORMAL LOW (ref 0.7–4.0)
MCH: 29.4 pg (ref 26.0–34.0)
MCHC: 33.3 g/dL (ref 30.0–36.0)
MCV: 88.2 fL (ref 80.0–100.0)
Monocytes Absolute: 0.1 10*3/uL (ref 0.1–1.0)
Monocytes Relative: 3 %
NEUTROS ABS: 3.1 10*3/uL (ref 1.7–7.7)
Neutrophils Relative %: 81 %
PLATELETS: 84 10*3/uL — AB (ref 150–400)
RBC: 2.96 MIL/uL — ABNORMAL LOW (ref 4.22–5.81)
RDW: 18.7 % — ABNORMAL HIGH (ref 11.5–15.5)
WBC: 3.8 10*3/uL — ABNORMAL LOW (ref 4.0–10.5)
nRBC: 1.1 % — ABNORMAL HIGH (ref 0.0–0.2)

## 2018-11-19 LAB — COMPREHENSIVE METABOLIC PANEL
ALBUMIN: 4 g/dL (ref 3.5–5.0)
ALT: 9 U/L (ref 0–44)
AST: 15 U/L (ref 15–41)
Alkaline Phosphatase: 37 U/L — ABNORMAL LOW (ref 38–126)
Anion gap: 10 (ref 5–15)
BUN: 53 mg/dL — AB (ref 8–23)
CO2: 23 mmol/L (ref 22–32)
Calcium: 8.3 mg/dL — ABNORMAL LOW (ref 8.9–10.3)
Chloride: 105 mmol/L (ref 98–111)
Creatinine, Ser: 3.58 mg/dL — ABNORMAL HIGH (ref 0.61–1.24)
GFR calc Af Amer: 20 mL/min — ABNORMAL LOW (ref >60)
GFR calc non Af Amer: 17 mL/min — ABNORMAL LOW (ref >60)
GLUCOSE: 139 mg/dL — AB (ref 70–99)
Potassium: 4.4 mmol/L (ref 3.5–5.1)
SODIUM: 138 mmol/L (ref 135–145)
Total Bilirubin: 0.6 mg/dL (ref 0.3–1.2)
Total Protein: 6.5 g/dL (ref 6.5–8.1)

## 2018-11-19 LAB — FERRITIN: Ferritin: 174 ng/mL (ref 24–336)

## 2018-11-19 LAB — IRON AND TIBC
Iron: 78 ug/dL (ref 45–182)
Saturation Ratios: 28 % (ref 17.9–39.5)
TIBC: 274 ug/dL (ref 250–450)
UIBC: 196 ug/dL

## 2018-11-19 LAB — LACTATE DEHYDROGENASE: LDH: 430 U/L — ABNORMAL HIGH (ref 98–192)

## 2018-11-19 MED ORDER — EPOETIN ALFA-EPBX 10000 UNIT/ML IJ SOLN
20000.0000 [IU] | INTRAMUSCULAR | Status: DC
  Administered 2018-11-19: 20000 [IU] via SUBCUTANEOUS
  Filled 2018-11-19: qty 2

## 2018-11-19 NOTE — Assessment & Plan Note (Addendum)
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 Renal involvement].  Stable LDH improving.  # continue Jakafi for now; at the current dose-continued anemia/moderate thrombocytopenia..  Discussed the need for a bone marrow biopsy to further evaluate response disease.  However given his acute renal issues hold off bone marrow biopsy at this time.  #Anemia secondary to chronic kidney disease/jakafi; Hb hemoglobin 8.7.  Poor response.  Increase retacrit to 20000 every 2 weeks.  No evidence of iron deficiency approximately 2 months ago.  #CKD stage V [Dr. Latif] S/p Kidney Biopsy- FSG; worsening- not on HD GFR ~17; awaiting to start dialysis soon; awaiting peritoneal dialysis.  Discussed that dosing of Jakafi has to be adjusted based upon his dialysis needs.  # DISPOSITION: print out labs; add iron studies/ferritin today.  # Retacrit injection today.  # in 2 weeks- labs-cbc/bmp-retacrit injection # follow up in 4 weeks-labs- MD-cbc/bmp/LDH-retacrit- Dr.B    Cc; [email or web address]

## 2018-11-19 NOTE — Progress Notes (Signed)
[facility] OFFICE PROGRESS NOTE  Patient Care Team: Valerie Roys, DO as PCP - General (Family Medicine) End, Harrell Gave, MD as PCP - Cardiology (Cardiology) Cammie Sickle, MD as Consulting Physician (Internal Medicine)  Cancer Staging No matching staging information was found for the patient.   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 [name]- kidney Bx- FSG [ on Prednisone Dr.[name]];   # AUGUST 1st week 2019- Retacrit  # October 2nd- jakafi 10 BID [? Renal involvement]  # hx of Lung nodules- resolved [[name]]/quit smoking.  DIAGNOSIS: PRIMARY MYELOFIBROSIS  RISK:LOW     ;GOALS: Control  CURRENT/MOST RECENT THERAPY : Jakafi       Primary myelofibrosis ([place])      INTERVAL HISTORY:  James Arnold 63 y.o.  male pleasant patient above history of primary myelofibrosis Jak 2+ currently on Jakafi 10 mg BID [renally dosed] and also chronic kidney disease/FSG on prednisone is here for follow-up.   Patient continues to feel poorly.  He has had peritoneal dialysis catheter placed.  He is currently not on dialysis yet.   He continues to be fatigued.  States appetite is good.  Eating well.  No worsening swelling in the legs.  No easy bruising or bleeding.  Review of Systems  Constitutional: Positive for malaise/fatigue. Negative for chills, diaphoresis, fever and weight loss.  HENT: Negative for nosebleeds and sore throat.   Eyes: Negative for double vision.  Respiratory: Positive for shortness of breath. Negative for cough, hemoptysis, sputum production and wheezing.   Cardiovascular: Positive for leg swelling. Negative for  chest pain, palpitations and orthopnea.  Gastrointestinal: Negative for abdominal pain, blood in stool, constipation, diarrhea, heartburn, melena, nausea and vomiting.  Genitourinary: Negative for dysuria, frequency and urgency.  Musculoskeletal: Negative for back pain and joint pain.  Skin: Negative.  Negative for itching and rash.  Neurological: Negative for dizziness, tingling, focal weakness, weakness and headaches.  Endo/Heme/Allergies: Does not bruise/bleed easily.  Psychiatric/Behavioral: Negative for depression. The patient is not nervous/anxious and does not have insomnia.       PAST MEDICAL HISTORY :  Past Medical History:  Diagnosis Date  . Acute renal failure (ARF) ([place]) 05/15/2017  . Anemia   . Benign hypertensive renal disease   . CAD (coronary artery disease)    a. 08/1996 s/p BMS to the mLAD ([facility]); b. 12/2017 MV: EF 46%, fixed apical defect w/ significant GI uptake artifact. No ischemia-> low risk.  . CKD (chronic kidney disease), stage IV ([place])   . Depression   . Diastolic dysfunction    a. 12/2017 Echo: EF 60-65%, no rwma, Gr1 DD, mild AI/MR. Nl RVSP.  [name] Kitchen Dyspnea    with exertion  . FSGS (focal segmental glomerulosclerosis)   . Hyperlipidemia 10/2002  . Hypertension   . Myelofibrosis ([place]) 2010  . Smoker   . Thrombocytopenia ([place])     PAST SURGICAL HISTORY :   Past Surgical History:  Procedure Laterality Date  . ANGIOPLASTY    . BONE MARROW ASPIRATION  03/2009  . CARDIAC CATHETERIZATION    . COLONOSCOPY WITH PROPOFOL N/A 08/09/2018   Procedure: COLONOSCOPY WITH PROPOFOL;  Surgeon: [name], MD;  Location[facility] ENDOSCOPY;  Service:  Gastroenterology;  Laterality: N/A;  . CORONARY ARTERY BYPASS GRAFT    . CORONARY STENT PLACEMENT  08/1996    FAMILY HISTORY :   Family History  Problem Relation Age of Onset  . Stroke Mother        Low BP stroke  . Depression Father   . Depression Sister        Breast  . Heart disease Other   . Breast cancer Other   .  Lung cancer Other   . Ovarian cancer Other   . Stomach cancer Other     SOCIAL HISTORY:   Social History   Tobacco Use  . Smoking status: Former Smoker    Packs/day: 1.00    Types: Cigarettes    Last attempt to quit: 01/11/2018    Years since quitting: 0.8  . Smokeless tobacco: Never Used  Substance Use Topics  . Alcohol use: No    Alcohol/week: 0.0 standard drinks    Frequency: Never    Comment: patient states he has not consumed alcohol in the last month  . Drug use: No    ALLERGIES:  has No Known Allergies.  MEDICATIONS:  Current Outpatient Medications  Medication Sig Dispense Refill  . allopurinol (ZYLOPRIM) 100 MG tablet Take 0.5 tablets (50 mg total) by mouth daily. 45 tablet 3  . amLODipine (NORVASC) 10 MG tablet Take 1 tablet (10 mg total) by mouth daily. 90 tablet 1  . carvedilol (COREG) 12.5 MG tablet Take 1 tablet (12.5 mg total) by mouth 2 (two) times daily. 180 tablet 3  . citalopram (CELEXA) 20 MG tablet Take 1 tablet (20 mg total) by mouth daily. 90 tablet 1  . ferrous sulfate 325 (65 FE) MG tablet Take 1 tablet (325 mg total) by mouth daily with breakfast. 30 tablet 1  . furosemide (LASIX) 20 MG tablet Take 1 tablet (20 mg total) by mouth daily. 90 tablet 0  . JAKAFI 10 MG tablet TAKE 1 TABLET (10 MG TOTAL) BY MOUTH TWO TIMES DAILY. 60 tablet 4  . predniSONE (DELTASONE) 10 MG tablet 6 tabs today, 5 tabs tomorrow, decrease by 1 every day until gone. 21 tablet 0  . sodium polystyrene (KAYEXALATE) powder 15 ml every 4-6 hours; until 2 loose stools. 454 g 0  . traMADol (ULTRAM) 50 MG tablet Take 1 tablet (50 mg total) by mouth every 8 (eight) hours as needed. 21 tablet 0   No current facility-administered medications for this visit.    Facility-Administered Medications Ordered in Other Visits  Medication Dose Route Frequency Provider Last Rate Last Dose  . epoetin alfa-epbx (RETACRIT) injection 20,000 Units  20,000 Units Subcutaneous Q14 Days Cammie Sickle, MD   20,000 Units at 11/19/18 1232    PHYSICAL EXAMINATION: ECOG PERFORMANCE STATUS: 1 - Symptomatic but completely ambulatory  BP (!) 158/81 (BP Location: Left Arm, Patient Position: Sitting, Cuff Size: Normal)   Pulse 65   Temp 97.8 F (36.6 C) (Tympanic)   Wt 185 lb (83.9 kg)   BMI 29.86 kg/m   Filed Weights   11/19/18 1142  Weight: 185 lb (83.9 kg)    Physical Exam  Constitutional: He is oriented to person, place, and time and well-developed, well-nourished, and in no distress.  Accompanied by his wife.  HENT:  Head: Normocephalic and atraumatic.  Mouth/Throat: Oropharynx is clear and moist. No oropharyngeal exudate.  Eyes: Pupils are equal, round, and reactive to light.  Neck: Normal range of motion. Neck supple.  Cardiovascular: Normal rate  and regular rhythm.  Pulmonary/Chest: No respiratory distress. He has no wheezes.  Decreased air entry bilaterally.  Abdominal: Soft. Bowel sounds are normal. He exhibits no distension and no mass. There is no abdominal tenderness. There is no rebound and no guarding.  Musculoskeletal: Normal range of motion.        General: No tenderness or edema.  Neurological: He is alert and oriented to person, place, and time.  Skin: Skin is warm.  Psychiatric: Affect normal.       LABORATORY DATA:  I have reviewed the data as listed    Component Value Date/Time   NA 138 11/19/2018 1010   NA 134 07/13/2018 1101   K 4.4 11/19/2018 1010   CL 105 11/19/2018 1010   CO2 23 11/19/2018 1010   GLUCOSE 139 (H) 11/19/2018 1010   BUN 53 (H) 11/19/2018 1010   BUN 57 (H) 07/13/2018 1101   CREATININE 3.58 (H) 11/19/2018 1010   CREATININE 1.12 09/30/2013 0953   CALCIUM 8.3 (L) 11/19/2018 1010   PROT 6.5 11/19/2018 1010   PROT 5.9 (L) 07/13/2018 1101   ALBUMIN 4.0 11/19/2018 1010   ALBUMIN 4.1 07/13/2018 1101   AST 15 11/19/2018 1010   ALT 9 11/19/2018 1010   ALKPHOS 37 (L) 11/19/2018 1010   BILITOT 0.6 11/19/2018 1010   BILITOT 0.3  07/13/2018 1101   GFRNONAA 17 (L) 11/19/2018 1010   GFRNONAA >60 09/30/2013 0953   GFRAA 20 (L) 11/19/2018 1010   GFRAA >60 09/30/2013 0953    No results found for: SPEP, UPEP  Lab Results  Component Value Date   WBC 3.8 (L) 11/19/2018   NEUTROABS 3.1 11/19/2018   HGB 8.7 (L) 11/19/2018   HCT 26.1 (L) 11/19/2018   MCV 88.2 11/19/2018   PLT 84 (L) 11/19/2018      Chemistry      Component Value Date/Time   NA 138 11/19/2018 1010   NA 134 07/13/2018 1101   K 4.4 11/19/2018 1010   CL 105 11/19/2018 1010   CO2 23 11/19/2018 1010   BUN 53 (H) 11/19/2018 1010   BUN 57 (H) 07/13/2018 1101   CREATININE 3.58 (H) 11/19/2018 1010   CREATININE 1.12 09/30/2013 0953      Component Value Date/Time   CALCIUM 8.3 (L) 11/19/2018 1010   ALKPHOS 37 (L) 11/19/2018 1010   AST 15 11/19/2018 1010   ALT 9 11/19/2018 1010   BILITOT 0.6 11/19/2018 1010   BILITOT 0.3 07/13/2018 1101       RADIOGRAPHIC STUDIES: I have personally reviewed the radiological images as listed and agreed with the findings in the report. No results found.   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 Renal involvement].  Stable LDH improving.  # continue Jakafi for now; at the current dose-continued anemia/moderate thrombocytopenia..  Discussed the need for a bone marrow biopsy to further evaluate response disease.  However given his acute renal issues hold off bone marrow biopsy at this time.  #Anemia secondary to chronic kidney disease/jakafi; Hb hemoglobin 8.7.  Poor response.  Increase retacrit to 20000 every 2 weeks.  No evidence of iron deficiency approximately 2 months ago.  #CKD stage V [Dr. Latif] S/p Kidney Biopsy- FSG; worsening- not on HD GFR ~17; awaiting to start dialysis soon; awaiting peritoneal dialysis.  Discussed that dosing of Jakafi has to be adjusted based upon his dialysis  needs.  #  DISPOSITION: print out labs; add iron studies/ferritin today.  # Retacrit injection today.  # in 2 weeks- labs-cbc/bmp-retacrit injection # follow up in 4 weeks-labs- MD-cbc/bmp/LDH-retacrit- Dr.B    Cc; Dr.[name]    No orders of the defined types were placed in this encounter.  All questions were answered. The patient knows to call the clinic with any problems, questions or concerns.     Cammie Sickle, MD 11/19/2018 12:57 PM

## 2018-11-29 DIAGNOSIS — I10 Essential (primary) hypertension: Secondary | ICD-10-CM | POA: Diagnosis not present

## 2018-11-29 DIAGNOSIS — E875 Hyperkalemia: Secondary | ICD-10-CM | POA: Diagnosis not present

## 2018-11-29 DIAGNOSIS — D631 Anemia in chronic kidney disease: Secondary | ICD-10-CM | POA: Diagnosis not present

## 2018-11-29 DIAGNOSIS — N041 Nephrotic syndrome with focal and segmental glomerular lesions: Secondary | ICD-10-CM | POA: Diagnosis not present

## 2018-11-29 DIAGNOSIS — N184 Chronic kidney disease, stage 4 (severe): Secondary | ICD-10-CM | POA: Diagnosis not present

## 2018-11-29 DIAGNOSIS — N2581 Secondary hyperparathyroidism of renal origin: Secondary | ICD-10-CM | POA: Diagnosis not present

## 2018-12-02 MED FILL — JAKAFI 10 MG TABLET: 10 | 30 days supply | Qty: 60 | Fill #1

## 2018-12-03 ENCOUNTER — Other Ambulatory Visit: Payer: Self-pay

## 2018-12-03 ENCOUNTER — Inpatient Hospital Stay: Payer: Medicare HMO

## 2018-12-03 ENCOUNTER — Inpatient Hospital Stay: Payer: Medicare HMO | Attending: Internal Medicine

## 2018-12-03 VITALS — BP 157/77 | HR 65

## 2018-12-03 DIAGNOSIS — Z79899 Other long term (current) drug therapy: Secondary | ICD-10-CM | POA: Diagnosis not present

## 2018-12-03 DIAGNOSIS — I2510 Atherosclerotic heart disease of native coronary artery without angina pectoris: Secondary | ICD-10-CM | POA: Diagnosis not present

## 2018-12-03 DIAGNOSIS — D471 Chronic myeloproliferative disease: Secondary | ICD-10-CM | POA: Diagnosis not present

## 2018-12-03 DIAGNOSIS — Z803 Family history of malignant neoplasm of breast: Secondary | ICD-10-CM | POA: Diagnosis not present

## 2018-12-03 DIAGNOSIS — F17210 Nicotine dependence, cigarettes, uncomplicated: Secondary | ICD-10-CM | POA: Diagnosis not present

## 2018-12-03 DIAGNOSIS — I129 Hypertensive chronic kidney disease with stage 1 through stage 4 chronic kidney disease, or unspecified chronic kidney disease: Secondary | ICD-10-CM | POA: Insufficient documentation

## 2018-12-03 DIAGNOSIS — D631 Anemia in chronic kidney disease: Secondary | ICD-10-CM

## 2018-12-03 DIAGNOSIS — N184 Chronic kidney disease, stage 4 (severe): Secondary | ICD-10-CM | POA: Insufficient documentation

## 2018-12-03 DIAGNOSIS — Z8041 Family history of malignant neoplasm of ovary: Secondary | ICD-10-CM | POA: Insufficient documentation

## 2018-12-03 DIAGNOSIS — F329 Major depressive disorder, single episode, unspecified: Secondary | ICD-10-CM | POA: Insufficient documentation

## 2018-12-03 DIAGNOSIS — Z800 Family history of malignant neoplasm of digestive organs: Secondary | ICD-10-CM | POA: Diagnosis not present

## 2018-12-03 DIAGNOSIS — D696 Thrombocytopenia, unspecified: Secondary | ICD-10-CM | POA: Insufficient documentation

## 2018-12-03 DIAGNOSIS — R0602 Shortness of breath: Secondary | ICD-10-CM | POA: Diagnosis not present

## 2018-12-03 DIAGNOSIS — Z801 Family history of malignant neoplasm of trachea, bronchus and lung: Secondary | ICD-10-CM | POA: Diagnosis not present

## 2018-12-03 DIAGNOSIS — E785 Hyperlipidemia, unspecified: Secondary | ICD-10-CM | POA: Insufficient documentation

## 2018-12-03 DIAGNOSIS — N183 Chronic kidney disease, stage 3 unspecified: Secondary | ICD-10-CM

## 2018-12-03 LAB — CBC WITH DIFFERENTIAL/PLATELET
Abs Immature Granulocytes: 0.04 10*3/uL (ref 0.00–0.07)
Basophils Absolute: 0 10*3/uL (ref 0.0–0.1)
Basophils Relative: 0 %
Eosinophils Absolute: 0 10*3/uL (ref 0.0–0.5)
Eosinophils Relative: 0 %
HCT: 27.5 % — ABNORMAL LOW (ref 39.0–52.0)
Hemoglobin: 9.2 g/dL — ABNORMAL LOW (ref 13.0–17.0)
Immature Granulocytes: 1 %
Lymphocytes Relative: 16 %
Lymphs Abs: 0.7 10*3/uL (ref 0.7–4.0)
MCH: 29.2 pg (ref 26.0–34.0)
MCHC: 33.5 g/dL (ref 30.0–36.0)
MCV: 87.3 fL (ref 80.0–100.0)
MONO ABS: 0.1 10*3/uL (ref 0.1–1.0)
Monocytes Relative: 3 %
Neutro Abs: 3.2 10*3/uL (ref 1.7–7.7)
Neutrophils Relative %: 80 %
Platelets: 74 10*3/uL — ABNORMAL LOW (ref 150–400)
RBC: 3.15 MIL/uL — AB (ref 4.22–5.81)
RDW: 18.7 % — ABNORMAL HIGH (ref 11.5–15.5)
WBC: 4.1 10*3/uL (ref 4.0–10.5)
nRBC: 1 % — ABNORMAL HIGH (ref 0.0–0.2)

## 2018-12-03 LAB — BASIC METABOLIC PANEL
Anion gap: 8 (ref 5–15)
BUN: 60 mg/dL — ABNORMAL HIGH (ref 8–23)
CO2: 22 mmol/L (ref 22–32)
Calcium: 8.7 mg/dL — ABNORMAL LOW (ref 8.9–10.3)
Chloride: 106 mmol/L (ref 98–111)
Creatinine, Ser: 3.85 mg/dL — ABNORMAL HIGH (ref 0.61–1.24)
GFR calc Af Amer: 18 mL/min — ABNORMAL LOW (ref >60)
GFR calc non Af Amer: 16 mL/min — ABNORMAL LOW (ref >60)
Glucose, Bld: 106 mg/dL — ABNORMAL HIGH (ref 70–99)
Potassium: 5.1 mmol/L (ref 3.5–5.1)
Sodium: 136 mmol/L (ref 135–145)

## 2018-12-03 MED ORDER — EPOETIN ALFA-EPBX 10000 UNIT/ML IJ SOLN
20000.0000 [IU] | INTRAMUSCULAR | Status: DC
  Administered 2018-12-03: 20000 [IU] via SUBCUTANEOUS
  Filled 2018-12-03: qty 2

## 2018-12-15 ENCOUNTER — Other Ambulatory Visit: Payer: Self-pay | Admitting: Nephrology

## 2018-12-15 ENCOUNTER — Other Ambulatory Visit: Payer: Self-pay

## 2018-12-15 ENCOUNTER — Ambulatory Visit
Admission: RE | Admit: 2018-12-15 | Discharge: 2018-12-15 | Disposition: A | Discharge status: Home / Self Care | Payer: Medicare HMO | Source: Ambulatory Visit | Attending: Nephrology | Admitting: Nephrology

## 2018-12-15 DIAGNOSIS — R109 Unspecified abdominal pain: Secondary | ICD-10-CM | POA: Diagnosis not present

## 2018-12-15 DIAGNOSIS — R188 Other ascites: Secondary | ICD-10-CM | POA: Diagnosis not present

## 2018-12-16 ENCOUNTER — Other Ambulatory Visit: Payer: Self-pay

## 2018-12-17 ENCOUNTER — Inpatient Hospital Stay: Payer: Medicare HMO

## 2018-12-17 ENCOUNTER — Encounter: Payer: Self-pay | Admitting: Internal Medicine

## 2018-12-17 ENCOUNTER — Inpatient Hospital Stay (HOSPITAL_BASED_OUTPATIENT_CLINIC_OR_DEPARTMENT_OTHER): Payer: Medicare HMO | Admitting: Internal Medicine

## 2018-12-17 ENCOUNTER — Other Ambulatory Visit: Payer: Self-pay

## 2018-12-17 VITALS — BP 163/78 | HR 61 | Temp 98.5°F | Resp 16 | Wt 178.6 lb

## 2018-12-17 VITALS — BP 147/77 | HR 66

## 2018-12-17 DIAGNOSIS — N041 Nephrotic syndrome with focal and segmental glomerular lesions: Secondary | ICD-10-CM | POA: Diagnosis not present

## 2018-12-17 DIAGNOSIS — D696 Thrombocytopenia, unspecified: Secondary | ICD-10-CM | POA: Diagnosis not present

## 2018-12-17 DIAGNOSIS — N183 Chronic kidney disease, stage 3 unspecified: Secondary | ICD-10-CM

## 2018-12-17 DIAGNOSIS — F329 Major depressive disorder, single episode, unspecified: Secondary | ICD-10-CM | POA: Diagnosis not present

## 2018-12-17 DIAGNOSIS — R0602 Shortness of breath: Secondary | ICD-10-CM | POA: Diagnosis not present

## 2018-12-17 DIAGNOSIS — D631 Anemia in chronic kidney disease: Secondary | ICD-10-CM

## 2018-12-17 DIAGNOSIS — I2510 Atherosclerotic heart disease of native coronary artery without angina pectoris: Secondary | ICD-10-CM

## 2018-12-17 DIAGNOSIS — Z8041 Family history of malignant neoplasm of ovary: Secondary | ICD-10-CM

## 2018-12-17 DIAGNOSIS — E785 Hyperlipidemia, unspecified: Secondary | ICD-10-CM | POA: Diagnosis not present

## 2018-12-17 DIAGNOSIS — Z800 Family history of malignant neoplasm of digestive organs: Secondary | ICD-10-CM

## 2018-12-17 DIAGNOSIS — F17210 Nicotine dependence, cigarettes, uncomplicated: Secondary | ICD-10-CM

## 2018-12-17 DIAGNOSIS — N184 Chronic kidney disease, stage 4 (severe): Secondary | ICD-10-CM | POA: Diagnosis not present

## 2018-12-17 DIAGNOSIS — Z79899 Other long term (current) drug therapy: Secondary | ICD-10-CM

## 2018-12-17 DIAGNOSIS — R809 Proteinuria, unspecified: Secondary | ICD-10-CM | POA: Diagnosis not present

## 2018-12-17 DIAGNOSIS — I129 Hypertensive chronic kidney disease with stage 1 through stage 4 chronic kidney disease, or unspecified chronic kidney disease: Secondary | ICD-10-CM | POA: Diagnosis not present

## 2018-12-17 DIAGNOSIS — Z801 Family history of malignant neoplasm of trachea, bronchus and lung: Secondary | ICD-10-CM

## 2018-12-17 DIAGNOSIS — Z87891 Personal history of nicotine dependence: Secondary | ICD-10-CM

## 2018-12-17 DIAGNOSIS — D471 Chronic myeloproliferative disease: Secondary | ICD-10-CM | POA: Diagnosis not present

## 2018-12-17 DIAGNOSIS — Z803 Family history of malignant neoplasm of breast: Secondary | ICD-10-CM

## 2018-12-17 LAB — CBC WITH DIFFERENTIAL/PLATELET
Abs Immature Granulocytes: 0.04 10*3/uL (ref 0.00–0.07)
Basophils Absolute: 0 10*3/uL (ref 0.0–0.1)
Basophils Relative: 0 %
Eosinophils Absolute: 0 10*3/uL (ref 0.0–0.5)
Eosinophils Relative: 0 %
HCT: 26.3 % — ABNORMAL LOW (ref 39.0–52.0)
HEMOGLOBIN: 8.9 g/dL — AB (ref 13.0–17.0)
Immature Granulocytes: 1 %
Lymphocytes Relative: 17 %
Lymphs Abs: 0.6 10*3/uL — ABNORMAL LOW (ref 0.7–4.0)
MCH: 29.9 pg (ref 26.0–34.0)
MCHC: 33.8 g/dL (ref 30.0–36.0)
MCV: 88.3 fL (ref 80.0–100.0)
MONO ABS: 0.1 10*3/uL (ref 0.1–1.0)
Monocytes Relative: 4 %
Neutro Abs: 2.7 10*3/uL (ref 1.7–7.7)
Neutrophils Relative %: 78 %
PLATELETS: 72 10*3/uL — AB (ref 150–400)
RBC: 2.98 MIL/uL — ABNORMAL LOW (ref 4.22–5.81)
RDW: 18.4 % — ABNORMAL HIGH (ref 11.5–15.5)
WBC: 3.5 10*3/uL — ABNORMAL LOW (ref 4.0–10.5)
nRBC: 0.6 % — ABNORMAL HIGH (ref 0.0–0.2)

## 2018-12-17 LAB — BASIC METABOLIC PANEL
Anion gap: 8 (ref 5–15)
BUN: 56 mg/dL — ABNORMAL HIGH (ref 8–23)
CO2: 21 mmol/L — ABNORMAL LOW (ref 22–32)
Calcium: 8.8 mg/dL — ABNORMAL LOW (ref 8.9–10.3)
Chloride: 108 mmol/L (ref 98–111)
Creatinine, Ser: 3.39 mg/dL — ABNORMAL HIGH (ref 0.61–1.24)
GFR calc Af Amer: 21 mL/min — ABNORMAL LOW (ref >60)
GFR calc non Af Amer: 18 mL/min — ABNORMAL LOW (ref >60)
Glucose, Bld: 100 mg/dL — ABNORMAL HIGH (ref 70–99)
Potassium: 4.8 mmol/L (ref 3.5–5.1)
SODIUM: 137 mmol/L (ref 135–145)

## 2018-12-17 MED ORDER — EPOETIN ALFA-EPBX 10000 UNIT/ML IJ SOLN
20000.0000 [IU] | INTRAMUSCULAR | Status: DC
  Administered 2018-12-17: 20000 [IU] via SUBCUTANEOUS
  Filled 2018-12-17: qty 2

## 2018-12-17 NOTE — Progress Notes (Signed)
Dr. B aware of BP. Will continue Retacrit   Larene Beach, PharmD

## 2018-12-17 NOTE — Progress Notes (Signed)
[facility] OFFICE PROGRESS NOTE  Patient Care Team: Valerie Roys, DO as PCP - General (Family Medicine) End, Harrell Gave, MD as PCP - Cardiology (Cardiology) Cammie Sickle, MD as Consulting Physician (Internal Medicine)  Cancer Staging No matching staging information was found for the patient.   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 [name]- kidney Bx- FSG [ on Prednisone Dr.[name]];   # AUGUST 1st week 2019- Retacrit  # October 2nd- jakafi 10 BID [? Renal involvement]  # hx of Lung nodules- resolved [[name]]/quit smoking.  DIAGNOSIS: PRIMARY MYELOFIBROSIS  RISK:LOW     ;GOALS: Control  CURRENT/MOST RECENT THERAPY : Jakafi       Primary myelofibrosis ([place])      INTERVAL HISTORY:  James ROHLEDER 63 y.o.  male pleasant patient above history of primary myelofibrosis Jak 2+ currently on Jakafi 10 mg BID [renally dosed] and also chronic kidney disease/FSG on prednisone is here for follow-up.   He continues to feel poorly.  He is awaiting to start peritoneal dialysis. Continues to have shortness of breath on exertion.  No fevers.  Review of Systems  Constitutional: Positive for malaise/fatigue. Negative for chills, diaphoresis, fever and weight loss.  HENT: Negative for nosebleeds and sore throat.   Eyes: Negative for double vision.  Respiratory: Positive for shortness of breath. Negative for cough, hemoptysis, sputum production and wheezing.   Cardiovascular: Positive for leg swelling. Negative for chest pain, palpitations and orthopnea.  Gastrointestinal: Negative for abdominal pain, blood in stool, constipation,  diarrhea, heartburn, melena, nausea and vomiting.  Genitourinary: Negative for dysuria, frequency and urgency.  Musculoskeletal: Negative for back pain and joint pain.  Skin: Negative.  Negative for itching and rash.  Neurological: Negative for dizziness, tingling, focal weakness, weakness and headaches.  Endo/Heme/Allergies: Does not bruise/bleed easily.  Psychiatric/Behavioral: Negative for depression. The patient is not nervous/anxious and does not have insomnia.       PAST MEDICAL HISTORY :  Past Medical History:  Diagnosis Date  . Acute renal failure (ARF) ([place]) 05/15/2017  . Anemia   . Benign hypertensive renal disease   . CAD (coronary artery disease)    a. 08/1996 s/p BMS to the mLAD ([facility]); b. 12/2017 MV: EF 46%, fixed apical defect w/ significant GI uptake artifact. No ischemia-> low risk.  . CKD (chronic kidney disease), stage IV ([place])   . Depression   . Diastolic dysfunction    a. 12/2017 Echo: EF 60-65%, no rwma, Gr1 DD, mild AI/MR. Nl RVSP.  [name] Kitchen Dyspnea    with exertion  . FSGS (focal segmental glomerulosclerosis)   . Hyperlipidemia 10/2002  . Hypertension   . Myelofibrosis ([place]) 2010  . Smoker   . Thrombocytopenia ([place])     PAST SURGICAL HISTORY :   Past Surgical History:  Procedure Laterality Date  . ANGIOPLASTY    . BONE MARROW ASPIRATION  03/2009  . CARDIAC CATHETERIZATION    . COLONOSCOPY WITH PROPOFOL N/A 08/09/2018   Procedure: COLONOSCOPY WITH PROPOFOL;  Surgeon: [name], MD;  Location: [facility] ENDOSCOPY;  Service: Gastroenterology;  Laterality: N/A;  . CORONARY ARTERY BYPASS GRAFT    . CORONARY STENT PLACEMENT  08/1996    FAMILY HISTORY :  Family History  Problem Relation Age of Onset  . Stroke Mother        Low BP stroke  . Depression Father   . Depression Sister        Breast  . Heart disease Other   . Breast cancer Other   . Lung cancer Other   . Ovarian cancer Other   . Stomach cancer Other     SOCIAL HISTORY:   Social History    Tobacco Use  . Smoking status: Former Smoker    Packs/day: 1.00    Types: Cigarettes    Last attempt to quit: 01/11/2018    Years since quitting: 0.9  . Smokeless tobacco: Never Used  Substance Use Topics  . Alcohol use: No    Alcohol/week: 0.0 standard drinks    Frequency: Never    Comment: patient states he has not consumed alcohol in the last month  . Drug use: No    ALLERGIES:  has No Known Allergies.  MEDICATIONS:  Current Outpatient Medications  Medication Sig Dispense Refill  . allopurinol (ZYLOPRIM) 100 MG tablet Take 0.5 tablets (50 mg total) by mouth daily. 45 tablet 3  . citalopram (CELEXA) 20 MG tablet Take 1 tablet (20 mg total) by mouth daily. 90 tablet 1  . furosemide (LASIX) 20 MG tablet Take 1 tablet (20 mg total) by mouth daily. 90 tablet 0  . JAKAFI 10 MG tablet TAKE 1 TABLET (10 MG TOTAL) BY MOUTH TWO TIMES DAILY. 60 tablet 4  . predniSONE (DELTASONE) 10 MG tablet 6 tabs today, 5 tabs tomorrow, decrease by 1 every day until gone. 21 tablet 0  . sodium polystyrene (KAYEXALATE) powder 15 ml every 4-6 hours; until 2 loose stools. 454 g 0  . traMADol (ULTRAM) 50 MG tablet Take 1 tablet (50 mg total) by mouth every 8 (eight) hours as needed. 21 tablet 0  . amLODipine (NORVASC) 10 MG tablet Take 1 tablet (10 mg total) by mouth daily. 90 tablet 1  . carvedilol (COREG) 12.5 MG tablet Take 1 tablet (12.5 mg total) by mouth 2 (two) times daily. 180 tablet 3  . ferrous sulfate 325 (65 FE) MG tablet Take 1 tablet (325 mg total) by mouth daily with breakfast. 30 tablet 1   No current facility-administered medications for this visit.    Facility-Administered Medications Ordered in Other Visits  Medication Dose Route Frequency Provider Last Rate Last Dose  . epoetin alfa-epbx (RETACRIT) injection 20,000 Units  20,000 Units Subcutaneous Q14 Days Cammie Sickle, MD   20,000 Units at 12/17/18 1049    PHYSICAL EXAMINATION: ECOG PERFORMANCE STATUS: 1 - Symptomatic but  completely ambulatory  BP (!) 163/78 (BP Location: Left Arm, Patient Position: Sitting, Cuff Size: Normal)   Pulse 61   Temp 98.5 F (36.9 C) (Tympanic)   Resp 16   Wt 178 lb 9.6 oz (81 kg)   BMI 28.83 kg/m   Filed Weights   12/17/18 0944  Weight: 178 lb 9.6 oz (81 kg)    Physical Exam  Constitutional: He is oriented to person, place, and time and well-developed, well-nourished, and in no distress.  Accompanied by his wife.  HENT:  Head: Normocephalic and atraumatic.  Mouth/Throat: Oropharynx is clear and moist. No oropharyngeal exudate.  Eyes: Pupils are equal, round, and reactive to light.  Neck: Normal range of motion. Neck supple.  Cardiovascular: Normal rate and regular rhythm.  Pulmonary/Chest: No respiratory distress. He has no wheezes.  Decreased air entry bilaterally.  Abdominal:  Soft. Bowel sounds are normal. He exhibits no distension and no mass. There is no abdominal tenderness. There is no rebound and no guarding.  Musculoskeletal: Normal range of motion.        General: No tenderness or edema.  Neurological: He is alert and oriented to person, place, and time.  Skin: Skin is warm.  Psychiatric: Affect normal.       LABORATORY DATA:  I have reviewed the data as listed    Component Value Date/Time   NA 137 12/17/2018 0909   NA 134 07/13/2018 1101   K 4.8 12/17/2018 0909   CL 108 12/17/2018 0909   CO2 21 (L) 12/17/2018 0909   GLUCOSE 100 (H) 12/17/2018 0909   BUN 56 (H) 12/17/2018 0909   BUN 57 (H) 07/13/2018 1101   CREATININE 3.39 (H) 12/17/2018 0909   CREATININE 1.12 09/30/2013 0953   CALCIUM 8.8 (L) 12/17/2018 0909   PROT 6.5 11/19/2018 1010   PROT 5.9 (L) 07/13/2018 1101   ALBUMIN 4.0 11/19/2018 1010   ALBUMIN 4.1 07/13/2018 1101   AST 15 11/19/2018 1010   ALT 9 11/19/2018 1010   ALKPHOS 37 (L) 11/19/2018 1010   BILITOT 0.6 11/19/2018 1010   BILITOT 0.3 07/13/2018 1101   GFRNONAA 18 (L) 12/17/2018 0909   GFRNONAA >60 09/30/2013 0953    GFRAA 21 (L) 12/17/2018 0909   GFRAA >60 09/30/2013 0953    No results found for: SPEP, UPEP  Lab Results  Component Value Date   WBC 3.5 (L) 12/17/2018   NEUTROABS 2.7 12/17/2018   HGB 8.9 (L) 12/17/2018   HCT 26.3 (L) 12/17/2018   MCV 88.3 12/17/2018   PLT 72 (L) 12/17/2018      Chemistry      Component Value Date/Time   NA 137 12/17/2018 0909   NA 134 07/13/2018 1101   K 4.8 12/17/2018 0909   CL 108 12/17/2018 0909   CO2 21 (L) 12/17/2018 0909   BUN 56 (H) 12/17/2018 0909   BUN 57 (H) 07/13/2018 1101   CREATININE 3.39 (H) 12/17/2018 0909   CREATININE 1.12 09/30/2013 0953      Component Value Date/Time   CALCIUM 8.8 (L) 12/17/2018 0909   ALKPHOS 37 (L) 11/19/2018 1010   AST 15 11/19/2018 1010   ALT 9 11/19/2018 1010   BILITOT 0.6 11/19/2018 1010   BILITOT 0.3 07/13/2018 1101       RADIOGRAPHIC STUDIES: I have personally reviewed the radiological images as listed and agreed with the findings in the report. No results found.   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 Renal involvement].  STABLE.   # continue Jakafi for now; at the current dose-continued anemia/moderate thrombocytopenia. Again discussed re: repeat BMBx.   # Anemia secondary to chronic kidney disease/jakafi; Hb hemoglobin 9.2. continue  retacrit to 20000 every 2 weeks; ok with BP.   # HTN- poorly controlled  #CKD stage V [Dr. Latif] S/p Kidney Biopsy- FSG; GFR ~17; STABLE. Awaiting to start on PD.   # DISPOSITION: print out labs; # Retacrit injection today.  # in 2 weeks- labs-cbc/bmp-retacrit injection #  in 4 weeks- labs-cbc/bmp-retacrit injection # follow up in 6 weeks-labs- MD-cbc/bmp/LDH-retacrit- Dr.B    Cc; Dr.[name]    No orders of the defined types were placed in this encounter.  All questions were answered. The patient knows to call the clinic with any problems,  questions or concerns.     Cammie Sickle, MD 12/17/2018 1:05 PM

## 2018-12-17 NOTE — Assessment & Plan Note (Addendum)
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 Renal involvement].  STABLE.   # continue Jakafi for now; at the current dose-continued anemia/moderate thrombocytopenia. Again discussed re: repeat BMBx to assess response of myelofibrosis.  However understands that if progression noted other good options available for the patient.  # Anemia secondary to chronic kidney disease/jakafi; Hb hemoglobin 9.2. continue  retacrit to 20000 every 2 weeks; ok with BP.   # HTN- poorly controlled; recommend close monitoring.  #CKD stage V [Dr. Latif] S/p Kidney Biopsy- FSG; GFR ~17; STABLE. Awaiting to start on PD.   # DISPOSITION: print out labs; # Retacrit injection today.  # in 2 weeks- labs-cbc/bmp-retacrit injection #  in 4 weeks- labs-cbc/bmp-retacrit injection # follow up in 6 weeks-labs- MD-cbc/bmp/LDH-retacrit- Dr.B    Cc; Dr.[name]

## 2018-12-30 ENCOUNTER — Other Ambulatory Visit: Payer: Self-pay

## 2018-12-31 ENCOUNTER — Inpatient Hospital Stay: Payer: Medicare HMO | Attending: Internal Medicine

## 2018-12-31 ENCOUNTER — Inpatient Hospital Stay: Payer: Medicare HMO

## 2018-12-31 ENCOUNTER — Other Ambulatory Visit: Payer: Self-pay

## 2018-12-31 VITALS — BP 137/82 | HR 69

## 2018-12-31 DIAGNOSIS — D631 Anemia in chronic kidney disease: Secondary | ICD-10-CM | POA: Diagnosis not present

## 2018-12-31 DIAGNOSIS — N183 Chronic kidney disease, stage 3 unspecified: Secondary | ICD-10-CM

## 2018-12-31 DIAGNOSIS — I129 Hypertensive chronic kidney disease with stage 1 through stage 4 chronic kidney disease, or unspecified chronic kidney disease: Secondary | ICD-10-CM | POA: Diagnosis not present

## 2018-12-31 DIAGNOSIS — Z79899 Other long term (current) drug therapy: Secondary | ICD-10-CM | POA: Insufficient documentation

## 2018-12-31 LAB — CBC WITH DIFFERENTIAL/PLATELET
Abs Immature Granulocytes: 0.02 10*3/uL (ref 0.00–0.07)
Basophils Absolute: 0 10*3/uL (ref 0.0–0.1)
Basophils Relative: 0 %
Eosinophils Absolute: 0 10*3/uL (ref 0.0–0.5)
Eosinophils Relative: 0 %
HCT: 26.8 % — ABNORMAL LOW (ref 39.0–52.0)
Hemoglobin: 8.8 g/dL — ABNORMAL LOW (ref 13.0–17.0)
Immature Granulocytes: 1 %
Lymphocytes Relative: 22 %
Lymphs Abs: 0.7 10*3/uL (ref 0.7–4.0)
MCH: 29 pg (ref 26.0–34.0)
MCHC: 32.8 g/dL (ref 30.0–36.0)
MCV: 88.4 fL (ref 80.0–100.0)
Monocytes Absolute: 0.2 10*3/uL (ref 0.1–1.0)
Monocytes Relative: 5 %
Neutro Abs: 2.3 10*3/uL (ref 1.7–7.7)
Neutrophils Relative %: 72 %
Platelets: 67 10*3/uL — ABNORMAL LOW (ref 150–400)
RBC: 3.03 MIL/uL — ABNORMAL LOW (ref 4.22–5.81)
RDW: 18.2 % — ABNORMAL HIGH (ref 11.5–15.5)
WBC: 3.2 10*3/uL — ABNORMAL LOW (ref 4.0–10.5)
nRBC: 1.2 % — ABNORMAL HIGH (ref 0.0–0.2)

## 2018-12-31 LAB — BASIC METABOLIC PANEL
Anion gap: 8 (ref 5–15)
BUN: 47 mg/dL — ABNORMAL HIGH (ref 8–23)
CO2: 22 mmol/L (ref 22–32)
Calcium: 9 mg/dL (ref 8.9–10.3)
Chloride: 107 mmol/L (ref 98–111)
Creatinine, Ser: 3.92 mg/dL — ABNORMAL HIGH (ref 0.61–1.24)
GFR calc Af Amer: 18 mL/min — ABNORMAL LOW (ref >60)
GFR calc non Af Amer: 15 mL/min — ABNORMAL LOW (ref >60)
Glucose, Bld: 92 mg/dL (ref 70–99)
Potassium: 5.2 mmol/L — ABNORMAL HIGH (ref 3.5–5.1)
Sodium: 137 mmol/L (ref 135–145)

## 2018-12-31 MED ORDER — EPOETIN ALFA-EPBX 10000 UNIT/ML IJ SOLN
20000.0000 [IU] | INTRAMUSCULAR | Status: DC
  Administered 2018-12-31: 20000 [IU] via SUBCUTANEOUS
  Filled 2018-12-31: qty 2

## 2019-01-05 MED FILL — JAKAFI 10 MG TABLET: 10 | 30 days supply | Qty: 60 | Fill #2

## 2019-01-10 DIAGNOSIS — N041 Nephrotic syndrome with focal and segmental glomerular lesions: Secondary | ICD-10-CM | POA: Diagnosis not present

## 2019-01-10 DIAGNOSIS — N2581 Secondary hyperparathyroidism of renal origin: Secondary | ICD-10-CM | POA: Diagnosis not present

## 2019-01-10 DIAGNOSIS — E875 Hyperkalemia: Secondary | ICD-10-CM | POA: Diagnosis not present

## 2019-01-10 DIAGNOSIS — D631 Anemia in chronic kidney disease: Secondary | ICD-10-CM | POA: Diagnosis not present

## 2019-01-10 DIAGNOSIS — I10 Essential (primary) hypertension: Secondary | ICD-10-CM | POA: Diagnosis not present

## 2019-01-12 ENCOUNTER — Ambulatory Visit: Payer: Medicare HMO | Admitting: Family Medicine

## 2019-01-13 ENCOUNTER — Other Ambulatory Visit: Payer: Self-pay

## 2019-01-14 ENCOUNTER — Inpatient Hospital Stay: Payer: Medicare HMO

## 2019-01-14 ENCOUNTER — Other Ambulatory Visit: Payer: Self-pay

## 2019-01-14 ENCOUNTER — Inpatient Hospital Stay: Payer: Medicare HMO | Attending: Internal Medicine

## 2019-01-14 VITALS — BP 137/75 | HR 73

## 2019-01-14 DIAGNOSIS — I129 Hypertensive chronic kidney disease with stage 1 through stage 4 chronic kidney disease, or unspecified chronic kidney disease: Secondary | ICD-10-CM | POA: Insufficient documentation

## 2019-01-14 DIAGNOSIS — D631 Anemia in chronic kidney disease: Secondary | ICD-10-CM | POA: Diagnosis not present

## 2019-01-14 DIAGNOSIS — N183 Chronic kidney disease, stage 3 unspecified: Secondary | ICD-10-CM

## 2019-01-14 DIAGNOSIS — Z79899 Other long term (current) drug therapy: Secondary | ICD-10-CM | POA: Diagnosis not present

## 2019-01-14 LAB — CBC WITH DIFFERENTIAL/PLATELET
Abs Immature Granulocytes: 0.11 10*3/uL — ABNORMAL HIGH (ref 0.00–0.07)
Basophils Absolute: 0 10*3/uL (ref 0.0–0.1)
Basophils Relative: 0 %
Eosinophils Absolute: 0 10*3/uL (ref 0.0–0.5)
Eosinophils Relative: 0 %
HCT: 27.5 % — ABNORMAL LOW (ref 39.0–52.0)
Hemoglobin: 9.1 g/dL — ABNORMAL LOW (ref 13.0–17.0)
Immature Granulocytes: 2 %
Lymphocytes Relative: 16 %
Lymphs Abs: 0.7 10*3/uL (ref 0.7–4.0)
MCH: 28.8 pg (ref 26.0–34.0)
MCHC: 33.1 g/dL (ref 30.0–36.0)
MCV: 87 fL (ref 80.0–100.0)
Monocytes Absolute: 0.2 10*3/uL (ref 0.1–1.0)
Monocytes Relative: 4 %
Neutro Abs: 3.7 10*3/uL (ref 1.7–7.7)
Neutrophils Relative %: 78 %
Platelets: 116 10*3/uL — ABNORMAL LOW (ref 150–400)
RBC: 3.16 MIL/uL — ABNORMAL LOW (ref 4.22–5.81)
RDW: 18.6 % — ABNORMAL HIGH (ref 11.5–15.5)
WBC: 4.7 10*3/uL (ref 4.0–10.5)
nRBC: 1.1 % — ABNORMAL HIGH (ref 0.0–0.2)

## 2019-01-14 LAB — BASIC METABOLIC PANEL
Anion gap: 8 (ref 5–15)
BUN: 66 mg/dL — ABNORMAL HIGH (ref 8–23)
CO2: 21 mmol/L — ABNORMAL LOW (ref 22–32)
Calcium: 8.9 mg/dL (ref 8.9–10.3)
Chloride: 108 mmol/L (ref 98–111)
Creatinine, Ser: 3.62 mg/dL — ABNORMAL HIGH (ref 0.61–1.24)
GFR calc Af Amer: 20 mL/min — ABNORMAL LOW (ref >60)
GFR calc non Af Amer: 17 mL/min — ABNORMAL LOW (ref >60)
Glucose, Bld: 115 mg/dL — ABNORMAL HIGH (ref 70–99)
Potassium: 5.3 mmol/L — ABNORMAL HIGH (ref 3.5–5.1)
Sodium: 137 mmol/L (ref 135–145)

## 2019-01-14 MED ORDER — EPOETIN ALFA-EPBX 10000 UNIT/ML IJ SOLN
20000.0000 [IU] | INTRAMUSCULAR | Status: DC
  Administered 2019-01-14: 20000 [IU] via SUBCUTANEOUS
  Filled 2019-01-14: qty 2

## 2019-01-27 ENCOUNTER — Other Ambulatory Visit: Payer: Self-pay

## 2019-01-28 ENCOUNTER — Encounter: Payer: Self-pay | Admitting: Internal Medicine

## 2019-01-28 ENCOUNTER — Inpatient Hospital Stay: Payer: Medicare HMO

## 2019-01-28 ENCOUNTER — Other Ambulatory Visit: Payer: Self-pay

## 2019-01-28 ENCOUNTER — Inpatient Hospital Stay (HOSPITAL_BASED_OUTPATIENT_CLINIC_OR_DEPARTMENT_OTHER): Payer: Medicare HMO | Admitting: Internal Medicine

## 2019-01-28 ENCOUNTER — Inpatient Hospital Stay: Payer: Medicare HMO | Attending: Internal Medicine

## 2019-01-28 DIAGNOSIS — N184 Chronic kidney disease, stage 4 (severe): Secondary | ICD-10-CM | POA: Diagnosis not present

## 2019-01-28 DIAGNOSIS — I10 Essential (primary) hypertension: Secondary | ICD-10-CM | POA: Diagnosis not present

## 2019-01-28 DIAGNOSIS — D631 Anemia in chronic kidney disease: Secondary | ICD-10-CM

## 2019-01-28 DIAGNOSIS — I129 Hypertensive chronic kidney disease with stage 1 through stage 4 chronic kidney disease, or unspecified chronic kidney disease: Secondary | ICD-10-CM | POA: Diagnosis not present

## 2019-01-28 DIAGNOSIS — Z79899 Other long term (current) drug therapy: Secondary | ICD-10-CM | POA: Insufficient documentation

## 2019-01-28 DIAGNOSIS — D471 Chronic myeloproliferative disease: Secondary | ICD-10-CM | POA: Diagnosis not present

## 2019-01-28 DIAGNOSIS — N183 Chronic kidney disease, stage 3 unspecified: Secondary | ICD-10-CM

## 2019-01-28 LAB — CBC WITH DIFFERENTIAL/PLATELET
Abs Immature Granulocytes: 0.03 10*3/uL (ref 0.00–0.07)
Basophils Absolute: 0 10*3/uL (ref 0.0–0.1)
Basophils Relative: 0 %
Eosinophils Absolute: 0 10*3/uL (ref 0.0–0.5)
Eosinophils Relative: 0 %
HCT: 27.9 % — ABNORMAL LOW (ref 39.0–52.0)
Hemoglobin: 8.9 g/dL — ABNORMAL LOW (ref 13.0–17.0)
Immature Granulocytes: 1 %
Lymphocytes Relative: 16 %
Lymphs Abs: 0.7 10*3/uL (ref 0.7–4.0)
MCH: 28.3 pg (ref 26.0–34.0)
MCHC: 31.9 g/dL (ref 30.0–36.0)
MCV: 88.6 fL (ref 80.0–100.0)
Monocytes Absolute: 0.2 10*3/uL (ref 0.1–1.0)
Monocytes Relative: 4 %
Neutro Abs: 3.4 10*3/uL (ref 1.7–7.7)
Neutrophils Relative %: 79 %
Platelets: 66 10*3/uL — ABNORMAL LOW (ref 150–400)
RBC: 3.15 MIL/uL — ABNORMAL LOW (ref 4.22–5.81)
RDW: 19.7 % — ABNORMAL HIGH (ref 11.5–15.5)
WBC: 4.3 10*3/uL (ref 4.0–10.5)
nRBC: 0.5 % — ABNORMAL HIGH (ref 0.0–0.2)

## 2019-01-28 LAB — BASIC METABOLIC PANEL
Anion gap: 8 (ref 5–15)
BUN: 65 mg/dL — ABNORMAL HIGH (ref 8–23)
CO2: 22 mmol/L (ref 22–32)
Calcium: 8.9 mg/dL (ref 8.9–10.3)
Chloride: 108 mmol/L (ref 98–111)
Creatinine, Ser: 3.8 mg/dL — ABNORMAL HIGH (ref 0.61–1.24)
GFR calc Af Amer: 19 mL/min — ABNORMAL LOW (ref >60)
GFR calc non Af Amer: 16 mL/min — ABNORMAL LOW (ref >60)
Glucose, Bld: 107 mg/dL — ABNORMAL HIGH (ref 70–99)
Potassium: 4.9 mmol/L (ref 3.5–5.1)
Sodium: 138 mmol/L (ref 135–145)

## 2019-01-28 MED ORDER — EPOETIN ALFA-EPBX 10000 UNIT/ML IJ SOLN
20000.0000 [IU] | INTRAMUSCULAR | Status: DC
  Administered 2019-01-28: 20000 [IU] via SUBCUTANEOUS
  Filled 2019-01-28: qty 2

## 2019-01-28 NOTE — Assessment & Plan Note (Addendum)
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 Renal involvement].  Clinically stable-see discussion below-given the worsening fatigue.  #Discussed regarding repeat bone marrow biopsy to evaluate the status of his myelofibrosis.  However given the lack of treatment options-be on Jakafi; I think it is reasonable to hold off bone marrow biopsy at this time.  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  Continue Retacrit 20,000 units every 2 weeks.  Also add Feraheme.  # HTN- poorly controlled; recommend close monitoring.  #CKD stage IV-V [Dr. Latif] S/p Kidney Biopsy- FSG; GFR ~16.  Stable.  However patient wife concerned about worsening fatigue-interested in dialysis.  I will reach out to Dr. Zollie Scale for recommendations.  # DISPOSITION: # mid- next week- Ferrahem # in 2 weeks- labs-cbc/bmp-retacrit injection #  in 4 weeks- labs-cbc/bmp-retacrit injection & Kirtland Bouchard [2 separate days] # follow up in 6 weeks-labs- MD-cbc/bmp/LDH-retacrit & Kirtland Bouchard [2 separate days]-  Dr.B    Cc; [email or web address]

## 2019-01-28 NOTE — Progress Notes (Signed)
I connected with [name] on 01/28/2019 at  1:30 PM EDT by video enabled telemedicine visit and verified that I am speaking with the correct person using two identifiers.  I discussed the limitations, risks, security and privacy concerns of performing an evaluation and management service by telemedicine and the availability of in-person appointments. I also discussed with the patient that there may be a patient responsible charge related to this service. The patient expressed understanding and agreed to proceed.    Other persons participating in the visit and their role in the encounter: Wife/coordination of care Patient's location: Home Provider's location: home   Oncology History   # 2010- [place]; Jak-2 positive;cytogenetics- Not done [bmbx- 2010] 2010- spleen- 13cm; Dynamic IPS- LOW [0-risk factor]; [place] 2017 AUG spleen-13cm; March 2019-bone marrow biopsy myelofibrosis/no blasts.   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 [place] 2019- kidney Bx- FSG [ on Prednisone Dr.[name]];   # AUGUST 1st week 2019- Retacrit  # October 2nd- jakafi 10 BID [? Renal involvement]  # hx of Lung nodules- resolved [[name]]/quit smoking.  DIAGNOSIS: PRIMARY MYELOFIBROSIS  RISK:LOW     ;GOALS: Control  CURRENT/MOST RECENT THERAPY : Jakafi       Primary myelofibrosis ([place])     Chief Complaint: Myelofibrosis/anemia   History of present illness:James Arnold 63 y.o.  male with history of stage IV kidney disease-not on dialysis ; and primary myelofibrosis not a candidate for transplant currently on palliative Jakafi.   Patient continues to be extremely fatigued.  Patient is getting growth factor support-hemoglobin anywhere between 8-9.  He states the injections are not helping.  He is quite frustrated.  He and his wife are interested in starting dialysis sooner than later to see if this will  help his symptoms of extreme fatigue.  Otherwise no blood in stools or black or stools.  Observation/objective: Hemoglobin 8.9.  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 Renal involvement].  Clinically stable-see discussion below-given the worsening fatigue.  #Discussed regarding repeat bone marrow biopsy to evaluate the status of his myelofibrosis.  However given the lack of treatment options-be on Jakafi; I think it is reasonable to hold off bone marrow biopsy at this time.  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  Continue Retacrit 20,000 units every 2 weeks.  Also add Feraheme.  # HTN- poorly controlled; recommend close monitoring.  #CKD stage IV-V [Dr. Latif] S/p Kidney Biopsy- FSG; GFR ~16.  Stable.  However patient wife concerned about worsening fatigue-interested in dialysis.  I will reach out to Dr. Zollie Scale for recommendations.  # DISPOSITION: # mid- next week- Ferrahem # in 2 weeks- labs-cbc/bmp-retacrit injection #  in 4 weeks- labs-cbc/bmp-retacrit injection & James Arnold [2 separate days] # follow up in 6 weeks-labs- MD-cbc/bmp/LDH-retacrit & James Arnold [2 separate days]-  Dr.B    Cc; Dr.[name]     Follow-up instructions:  I discussed the assessment and treatment plan with the patient.  The patient was provided an opportunity to ask questions and all were answered.  The patient agreed with the plan and demonstrated understanding of instructions.  The patient was advised to call back or seek an in person evaluation if the symptoms worsen or if the condition fails to improve as anticipated.  Dr. Charlaine Dalton  [facility] 01/31/2019 1:08 PM

## 2019-01-31 ENCOUNTER — Telehealth: Payer: Self-pay | Admitting: *Deleted

## 2019-01-31 ENCOUNTER — Other Ambulatory Visit: Payer: Self-pay

## 2019-01-31 MED ORDER — CARVEDILOL 12.5 MG PO TABS: 12.5000 mg | ORAL_TABLET | Freq: Two times a day (BID) | ORAL | 0 refills | Status: DC

## 2019-01-31 NOTE — Telephone Encounter (Signed)
Lmovm for pt to contact office to schedule future appointment as virtual visit. Pt hasn't been seen in over yr and needs appointment for future refills.

## 2019-02-01 MED FILL — JAKAFI 10 MG TABLET: 10 | 30 days supply | Qty: 60 | Fill #3

## 2019-02-02 ENCOUNTER — Inpatient Hospital Stay: Payer: Medicare HMO

## 2019-02-02 ENCOUNTER — Other Ambulatory Visit: Payer: Self-pay

## 2019-02-02 VITALS — BP 147/79 | HR 66 | Temp 97.7°F | Resp 18

## 2019-02-02 DIAGNOSIS — N184 Chronic kidney disease, stage 4 (severe): Secondary | ICD-10-CM | POA: Diagnosis not present

## 2019-02-02 DIAGNOSIS — D631 Anemia in chronic kidney disease: Secondary | ICD-10-CM

## 2019-02-02 DIAGNOSIS — Z79899 Other long term (current) drug therapy: Secondary | ICD-10-CM | POA: Diagnosis not present

## 2019-02-02 DIAGNOSIS — D471 Chronic myeloproliferative disease: Secondary | ICD-10-CM | POA: Diagnosis not present

## 2019-02-02 DIAGNOSIS — N183 Chronic kidney disease, stage 3 unspecified: Secondary | ICD-10-CM

## 2019-02-02 DIAGNOSIS — I129 Hypertensive chronic kidney disease with stage 1 through stage 4 chronic kidney disease, or unspecified chronic kidney disease: Secondary | ICD-10-CM | POA: Diagnosis not present

## 2019-02-02 MED ORDER — SODIUM CHLORIDE 0.9 % IV SOLN
510.0000 mg | Freq: Once | INTRAVENOUS | Status: AC
  Administered 2019-02-02: 510 mg via INTRAVENOUS
  Filled 2019-02-02: qty 17

## 2019-02-02 MED ORDER — SODIUM CHLORIDE 0.9 % IV SOLN
Freq: Once | INTRAVENOUS | Status: AC
  Administered 2019-02-02: 12:00:00 via INTRAVENOUS
  Filled 2019-02-02: qty 250

## 2019-02-02 NOTE — Progress Notes (Signed)
Pt tolerated infusion well. Pt monitored 30 minutes post infusion. Pt and VS stable at discharge.  

## 2019-02-04 DIAGNOSIS — N041 Nephrotic syndrome with focal and segmental glomerular lesions: Secondary | ICD-10-CM | POA: Diagnosis not present

## 2019-02-04 DIAGNOSIS — I10 Essential (primary) hypertension: Secondary | ICD-10-CM | POA: Diagnosis not present

## 2019-02-04 DIAGNOSIS — D631 Anemia in chronic kidney disease: Secondary | ICD-10-CM | POA: Diagnosis not present

## 2019-02-04 DIAGNOSIS — N2581 Secondary hyperparathyroidism of renal origin: Secondary | ICD-10-CM | POA: Diagnosis not present

## 2019-02-10 ENCOUNTER — Other Ambulatory Visit: Payer: Self-pay | Admitting: Family Medicine

## 2019-02-10 DIAGNOSIS — N041 Nephrotic syndrome with focal and segmental glomerular lesions: Secondary | ICD-10-CM | POA: Diagnosis not present

## 2019-02-10 DIAGNOSIS — N183 Chronic kidney disease, stage 3 (moderate): Secondary | ICD-10-CM | POA: Diagnosis not present

## 2019-02-10 DIAGNOSIS — R809 Proteinuria, unspecified: Secondary | ICD-10-CM | POA: Diagnosis not present

## 2019-02-10 NOTE — Telephone Encounter (Signed)
Courtesy refill  

## 2019-02-11 ENCOUNTER — Inpatient Hospital Stay: Payer: Medicare HMO

## 2019-02-11 ENCOUNTER — Other Ambulatory Visit: Payer: Self-pay

## 2019-02-11 VITALS — BP 154/79 | HR 69 | Resp 18

## 2019-02-11 DIAGNOSIS — N183 Chronic kidney disease, stage 3 unspecified: Secondary | ICD-10-CM

## 2019-02-11 DIAGNOSIS — D631 Anemia in chronic kidney disease: Secondary | ICD-10-CM | POA: Diagnosis not present

## 2019-02-11 DIAGNOSIS — N184 Chronic kidney disease, stage 4 (severe): Secondary | ICD-10-CM | POA: Diagnosis not present

## 2019-02-11 DIAGNOSIS — D471 Chronic myeloproliferative disease: Secondary | ICD-10-CM | POA: Diagnosis not present

## 2019-02-11 DIAGNOSIS — Z79899 Other long term (current) drug therapy: Secondary | ICD-10-CM | POA: Diagnosis not present

## 2019-02-11 DIAGNOSIS — I129 Hypertensive chronic kidney disease with stage 1 through stage 4 chronic kidney disease, or unspecified chronic kidney disease: Secondary | ICD-10-CM | POA: Diagnosis not present

## 2019-02-11 LAB — CBC WITH DIFFERENTIAL/PLATELET
Abs Immature Granulocytes: 0.06 10*3/uL (ref 0.00–0.07)
Basophils Absolute: 0 10*3/uL (ref 0.0–0.1)
Basophils Relative: 0 %
Eosinophils Absolute: 0 10*3/uL (ref 0.0–0.5)
Eosinophils Relative: 0 %
HCT: 28.4 % — ABNORMAL LOW (ref 39.0–52.0)
Hemoglobin: 9.2 g/dL — ABNORMAL LOW (ref 13.0–17.0)
Immature Granulocytes: 1 %
Lymphocytes Relative: 14 %
Lymphs Abs: 0.6 10*3/uL — ABNORMAL LOW (ref 0.7–4.0)
MCH: 28.9 pg (ref 26.0–34.0)
MCHC: 32.4 g/dL (ref 30.0–36.0)
MCV: 89.3 fL (ref 80.0–100.0)
Monocytes Absolute: 0.1 10*3/uL (ref 0.1–1.0)
Monocytes Relative: 3 %
Neutro Abs: 3.5 10*3/uL (ref 1.7–7.7)
Neutrophils Relative %: 82 %
Platelets: 79 10*3/uL — ABNORMAL LOW (ref 150–400)
RBC: 3.18 MIL/uL — ABNORMAL LOW (ref 4.22–5.81)
RDW: 19.8 % — ABNORMAL HIGH (ref 11.5–15.5)
WBC: 4.4 10*3/uL (ref 4.0–10.5)
nRBC: 0.5 % — ABNORMAL HIGH (ref 0.0–0.2)

## 2019-02-11 LAB — BASIC METABOLIC PANEL
Anion gap: 6 (ref 5–15)
BUN: 52 mg/dL — ABNORMAL HIGH (ref 8–23)
CO2: 22 mmol/L (ref 22–32)
Calcium: 8.5 mg/dL — ABNORMAL LOW (ref 8.9–10.3)
Chloride: 109 mmol/L (ref 98–111)
Creatinine, Ser: 3.85 mg/dL — ABNORMAL HIGH (ref 0.61–1.24)
GFR calc Af Amer: 18 mL/min — ABNORMAL LOW (ref >60)
GFR calc non Af Amer: 16 mL/min — ABNORMAL LOW (ref >60)
Glucose, Bld: 101 mg/dL — ABNORMAL HIGH (ref 70–99)
Potassium: 4.7 mmol/L (ref 3.5–5.1)
Sodium: 137 mmol/L (ref 135–145)

## 2019-02-11 MED ORDER — EPOETIN ALFA-EPBX 10000 UNIT/ML IJ SOLN
20000.0000 [IU] | INTRAMUSCULAR | Status: DC
  Administered 2019-02-11: 20000 [IU] via SUBCUTANEOUS
  Filled 2019-02-11: qty 2

## 2019-02-24 ENCOUNTER — Inpatient Hospital Stay: Payer: Medicare HMO | Attending: Internal Medicine

## 2019-02-24 ENCOUNTER — Other Ambulatory Visit: Payer: Self-pay

## 2019-02-24 VITALS — BP 155/73 | HR 67 | Resp 20

## 2019-02-24 DIAGNOSIS — M7989 Other specified soft tissue disorders: Secondary | ICD-10-CM | POA: Diagnosis not present

## 2019-02-24 DIAGNOSIS — I10 Essential (primary) hypertension: Secondary | ICD-10-CM | POA: Insufficient documentation

## 2019-02-24 DIAGNOSIS — Z801 Family history of malignant neoplasm of trachea, bronchus and lung: Secondary | ICD-10-CM | POA: Insufficient documentation

## 2019-02-24 DIAGNOSIS — I129 Hypertensive chronic kidney disease with stage 1 through stage 4 chronic kidney disease, or unspecified chronic kidney disease: Secondary | ICD-10-CM | POA: Insufficient documentation

## 2019-02-24 DIAGNOSIS — D696 Thrombocytopenia, unspecified: Secondary | ICD-10-CM | POA: Insufficient documentation

## 2019-02-24 DIAGNOSIS — Z803 Family history of malignant neoplasm of breast: Secondary | ICD-10-CM | POA: Diagnosis not present

## 2019-02-24 DIAGNOSIS — F17210 Nicotine dependence, cigarettes, uncomplicated: Secondary | ICD-10-CM | POA: Diagnosis not present

## 2019-02-24 DIAGNOSIS — Z79899 Other long term (current) drug therapy: Secondary | ICD-10-CM | POA: Diagnosis not present

## 2019-02-24 DIAGNOSIS — Z8041 Family history of malignant neoplasm of ovary: Secondary | ICD-10-CM | POA: Insufficient documentation

## 2019-02-24 DIAGNOSIS — R785 Finding of other psychotropic drug in blood: Secondary | ICD-10-CM | POA: Insufficient documentation

## 2019-02-24 DIAGNOSIS — I2510 Atherosclerotic heart disease of native coronary artery without angina pectoris: Secondary | ICD-10-CM | POA: Insufficient documentation

## 2019-02-24 DIAGNOSIS — N184 Chronic kidney disease, stage 4 (severe): Secondary | ICD-10-CM | POA: Insufficient documentation

## 2019-02-24 DIAGNOSIS — D631 Anemia in chronic kidney disease: Secondary | ICD-10-CM

## 2019-02-24 DIAGNOSIS — Z87891 Personal history of nicotine dependence: Secondary | ICD-10-CM | POA: Diagnosis not present

## 2019-02-24 DIAGNOSIS — Z800 Family history of malignant neoplasm of digestive organs: Secondary | ICD-10-CM | POA: Diagnosis not present

## 2019-02-24 DIAGNOSIS — D471 Chronic myeloproliferative disease: Secondary | ICD-10-CM | POA: Insufficient documentation

## 2019-02-24 DIAGNOSIS — R5383 Other fatigue: Secondary | ICD-10-CM | POA: Insufficient documentation

## 2019-02-24 DIAGNOSIS — R0602 Shortness of breath: Secondary | ICD-10-CM | POA: Insufficient documentation

## 2019-02-24 DIAGNOSIS — F329 Major depressive disorder, single episode, unspecified: Secondary | ICD-10-CM | POA: Insufficient documentation

## 2019-02-24 MED ORDER — SODIUM CHLORIDE 0.9 % IV SOLN
510.0000 mg | Freq: Once | INTRAVENOUS | Status: AC
  Administered 2019-02-24: 510 mg via INTRAVENOUS
  Filled 2019-02-24: qty 17

## 2019-02-24 MED ORDER — SODIUM CHLORIDE 0.9 % IV SOLN
Freq: Once | INTRAVENOUS | Status: AC
  Administered 2019-02-24: 12:00:00 via INTRAVENOUS
  Filled 2019-02-24: qty 250

## 2019-02-25 ENCOUNTER — Inpatient Hospital Stay: Payer: Medicare HMO

## 2019-02-25 ENCOUNTER — Other Ambulatory Visit: Payer: Self-pay

## 2019-02-25 VITALS — BP 147/82 | HR 65

## 2019-02-25 DIAGNOSIS — I2510 Atherosclerotic heart disease of native coronary artery without angina pectoris: Secondary | ICD-10-CM | POA: Diagnosis not present

## 2019-02-25 DIAGNOSIS — R0602 Shortness of breath: Secondary | ICD-10-CM | POA: Diagnosis not present

## 2019-02-25 DIAGNOSIS — N183 Chronic kidney disease, stage 3 unspecified: Secondary | ICD-10-CM

## 2019-02-25 DIAGNOSIS — R5383 Other fatigue: Secondary | ICD-10-CM | POA: Diagnosis not present

## 2019-02-25 DIAGNOSIS — D471 Chronic myeloproliferative disease: Secondary | ICD-10-CM | POA: Diagnosis not present

## 2019-02-25 DIAGNOSIS — N184 Chronic kidney disease, stage 4 (severe): Secondary | ICD-10-CM | POA: Diagnosis not present

## 2019-02-25 DIAGNOSIS — M7989 Other specified soft tissue disorders: Secondary | ICD-10-CM | POA: Diagnosis not present

## 2019-02-25 DIAGNOSIS — D696 Thrombocytopenia, unspecified: Secondary | ICD-10-CM | POA: Diagnosis not present

## 2019-02-25 DIAGNOSIS — D631 Anemia in chronic kidney disease: Secondary | ICD-10-CM

## 2019-02-25 DIAGNOSIS — F329 Major depressive disorder, single episode, unspecified: Secondary | ICD-10-CM | POA: Diagnosis not present

## 2019-02-25 DIAGNOSIS — I129 Hypertensive chronic kidney disease with stage 1 through stage 4 chronic kidney disease, or unspecified chronic kidney disease: Secondary | ICD-10-CM | POA: Diagnosis not present

## 2019-02-25 LAB — CBC WITH DIFFERENTIAL/PLATELET
Abs Immature Granulocytes: 0.05 10*3/uL (ref 0.00–0.07)
Basophils Absolute: 0 10*3/uL (ref 0.0–0.1)
Basophils Relative: 0 %
Eosinophils Absolute: 0 10*3/uL (ref 0.0–0.5)
Eosinophils Relative: 0 %
HCT: 29.6 % — ABNORMAL LOW (ref 39.0–52.0)
Hemoglobin: 9.5 g/dL — ABNORMAL LOW (ref 13.0–17.0)
Immature Granulocytes: 1 %
Lymphocytes Relative: 13 %
Lymphs Abs: 0.6 10*3/uL — ABNORMAL LOW (ref 0.7–4.0)
MCH: 29.4 pg (ref 26.0–34.0)
MCHC: 32.1 g/dL (ref 30.0–36.0)
MCV: 91.6 fL (ref 80.0–100.0)
Monocytes Absolute: 0.2 10*3/uL (ref 0.1–1.0)
Monocytes Relative: 4 %
Neutro Abs: 4 10*3/uL (ref 1.7–7.7)
Neutrophils Relative %: 82 %
Platelets: 77 10*3/uL — ABNORMAL LOW (ref 150–400)
RBC: 3.23 MIL/uL — ABNORMAL LOW (ref 4.22–5.81)
RDW: 19.8 % — ABNORMAL HIGH (ref 11.5–15.5)
WBC: 4.9 10*3/uL (ref 4.0–10.5)
nRBC: 0.6 % — ABNORMAL HIGH (ref 0.0–0.2)

## 2019-02-25 LAB — BASIC METABOLIC PANEL
Anion gap: 8 (ref 5–15)
BUN: 58 mg/dL — ABNORMAL HIGH (ref 8–23)
CO2: 21 mmol/L — ABNORMAL LOW (ref 22–32)
Calcium: 8.8 mg/dL — ABNORMAL LOW (ref 8.9–10.3)
Chloride: 109 mmol/L (ref 98–111)
Creatinine, Ser: 4.24 mg/dL — ABNORMAL HIGH (ref 0.61–1.24)
GFR calc Af Amer: 16 mL/min — ABNORMAL LOW (ref >60)
GFR calc non Af Amer: 14 mL/min — ABNORMAL LOW (ref >60)
Glucose, Bld: 103 mg/dL — ABNORMAL HIGH (ref 70–99)
Potassium: 5.1 mmol/L (ref 3.5–5.1)
Sodium: 138 mmol/L (ref 135–145)

## 2019-02-25 MED ORDER — EPOETIN ALFA-EPBX 10000 UNIT/ML IJ SOLN
20000.0000 [IU] | INTRAMUSCULAR | Status: DC
  Administered 2019-02-25: 20000 [IU] via SUBCUTANEOUS
  Filled 2019-02-25: qty 2

## 2019-03-03 MED FILL — JAKAFI 10 MG TABLET: 10 | 30 days supply | Qty: 60 | Fill #4

## 2019-03-07 DIAGNOSIS — N183 Chronic kidney disease, stage 3 (moderate): Secondary | ICD-10-CM | POA: Diagnosis not present

## 2019-03-07 DIAGNOSIS — R809 Proteinuria, unspecified: Secondary | ICD-10-CM | POA: Diagnosis not present

## 2019-03-07 DIAGNOSIS — N041 Nephrotic syndrome with focal and segmental glomerular lesions: Secondary | ICD-10-CM | POA: Diagnosis not present

## 2019-03-09 ENCOUNTER — Other Ambulatory Visit: Payer: Self-pay

## 2019-03-10 ENCOUNTER — Other Ambulatory Visit: Payer: Self-pay

## 2019-03-10 ENCOUNTER — Inpatient Hospital Stay (HOSPITAL_BASED_OUTPATIENT_CLINIC_OR_DEPARTMENT_OTHER): Payer: Medicare HMO | Admitting: Internal Medicine

## 2019-03-10 ENCOUNTER — Inpatient Hospital Stay: Payer: Medicare HMO

## 2019-03-10 VITALS — BP 123/80 | HR 63 | Temp 96.8°F | Resp 18 | Wt 181.0 lb

## 2019-03-10 DIAGNOSIS — Z803 Family history of malignant neoplasm of breast: Secondary | ICD-10-CM

## 2019-03-10 DIAGNOSIS — R0602 Shortness of breath: Secondary | ICD-10-CM | POA: Diagnosis not present

## 2019-03-10 DIAGNOSIS — N184 Chronic kidney disease, stage 4 (severe): Secondary | ICD-10-CM | POA: Diagnosis not present

## 2019-03-10 DIAGNOSIS — D471 Chronic myeloproliferative disease: Secondary | ICD-10-CM | POA: Diagnosis not present

## 2019-03-10 DIAGNOSIS — N183 Chronic kidney disease, stage 3 unspecified: Secondary | ICD-10-CM

## 2019-03-10 DIAGNOSIS — R785 Finding of other psychotropic drug in blood: Secondary | ICD-10-CM

## 2019-03-10 DIAGNOSIS — I129 Hypertensive chronic kidney disease with stage 1 through stage 4 chronic kidney disease, or unspecified chronic kidney disease: Secondary | ICD-10-CM | POA: Diagnosis not present

## 2019-03-10 DIAGNOSIS — F17210 Nicotine dependence, cigarettes, uncomplicated: Secondary | ICD-10-CM

## 2019-03-10 DIAGNOSIS — Z79899 Other long term (current) drug therapy: Secondary | ICD-10-CM

## 2019-03-10 DIAGNOSIS — R5383 Other fatigue: Secondary | ICD-10-CM

## 2019-03-10 DIAGNOSIS — Z8041 Family history of malignant neoplasm of ovary: Secondary | ICD-10-CM

## 2019-03-10 DIAGNOSIS — I10 Essential (primary) hypertension: Secondary | ICD-10-CM

## 2019-03-10 DIAGNOSIS — M7989 Other specified soft tissue disorders: Secondary | ICD-10-CM

## 2019-03-10 DIAGNOSIS — F329 Major depressive disorder, single episode, unspecified: Secondary | ICD-10-CM | POA: Diagnosis not present

## 2019-03-10 DIAGNOSIS — I2510 Atherosclerotic heart disease of native coronary artery without angina pectoris: Secondary | ICD-10-CM | POA: Diagnosis not present

## 2019-03-10 DIAGNOSIS — Z801 Family history of malignant neoplasm of trachea, bronchus and lung: Secondary | ICD-10-CM

## 2019-03-10 DIAGNOSIS — D696 Thrombocytopenia, unspecified: Secondary | ICD-10-CM | POA: Diagnosis not present

## 2019-03-10 DIAGNOSIS — Z800 Family history of malignant neoplasm of digestive organs: Secondary | ICD-10-CM

## 2019-03-10 DIAGNOSIS — Z87891 Personal history of nicotine dependence: Secondary | ICD-10-CM

## 2019-03-10 LAB — CBC WITH DIFFERENTIAL/PLATELET
Abs Immature Granulocytes: 0.05 10*3/uL (ref 0.00–0.07)
Basophils Absolute: 0 10*3/uL (ref 0.0–0.1)
Basophils Relative: 0 %
Eosinophils Absolute: 0 10*3/uL (ref 0.0–0.5)
Eosinophils Relative: 0 %
HCT: 31 % — ABNORMAL LOW (ref 39.0–52.0)
Hemoglobin: 10.1 g/dL — ABNORMAL LOW (ref 13.0–17.0)
Immature Granulocytes: 1 %
Lymphocytes Relative: 14 %
Lymphs Abs: 0.6 10*3/uL — ABNORMAL LOW (ref 0.7–4.0)
MCH: 29.2 pg (ref 26.0–34.0)
MCHC: 32.6 g/dL (ref 30.0–36.0)
MCV: 89.6 fL (ref 80.0–100.0)
Monocytes Absolute: 0.1 10*3/uL (ref 0.1–1.0)
Monocytes Relative: 4 %
Neutro Abs: 3.2 10*3/uL (ref 1.7–7.7)
Neutrophils Relative %: 81 %
Platelets: 62 10*3/uL — ABNORMAL LOW (ref 150–400)
RBC: 3.46 MIL/uL — ABNORMAL LOW (ref 4.22–5.81)
RDW: 18.7 % — ABNORMAL HIGH (ref 11.5–15.5)
WBC: 3.9 10*3/uL — ABNORMAL LOW (ref 4.0–10.5)
nRBC: 0.8 % — ABNORMAL HIGH (ref 0.0–0.2)

## 2019-03-10 LAB — BASIC METABOLIC PANEL
Anion gap: 10 (ref 5–15)
BUN: 68 mg/dL — ABNORMAL HIGH (ref 8–23)
CO2: 18 mmol/L — ABNORMAL LOW (ref 22–32)
Calcium: 8.7 mg/dL — ABNORMAL LOW (ref 8.9–10.3)
Chloride: 110 mmol/L (ref 98–111)
Creatinine, Ser: 4.09 mg/dL — ABNORMAL HIGH (ref 0.61–1.24)
GFR calc Af Amer: 17 mL/min — ABNORMAL LOW (ref >60)
GFR calc non Af Amer: 15 mL/min — ABNORMAL LOW (ref >60)
Glucose, Bld: 111 mg/dL — ABNORMAL HIGH (ref 70–99)
Potassium: 4.9 mmol/L (ref 3.5–5.1)
Sodium: 138 mmol/L (ref 135–145)

## 2019-03-10 NOTE — Progress Notes (Signed)
Patient is scheduled for iron infusion today but states the last iron infusions did not help with fatigue. Also requesting a refill on Tramadol (pharmacy verified with wife via telephone).

## 2019-03-10 NOTE — Progress Notes (Signed)
[facility] OFFICE PROGRESS NOTE  Patient Care Team: Valerie Roys, DO as PCP - General (Family Medicine) End, Harrell Gave, MD as PCP - Cardiology (Cardiology) Cammie Sickle, MD as Consulting Physician (Internal Medicine)  Cancer Staging No matching staging information was found for the patient.   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 [name]- kidney Bx- FSG [ on Prednisone Dr.[name]];   # AUGUST 1st week 2019- Retacrit  # October 2nd- jakafi 10 BID [? Renal involvement]  # hx of Lung nodules- resolved [[name]]/quit smoking.  DIAGNOSIS: PRIMARY MYELOFIBROSIS  RISK:LOW     ;GOALS: Control  CURRENT/MOST RECENT THERAPY : Jakafi     Primary myelofibrosis ([place])      INTERVAL HISTORY:  James Arnold 63 y.o.  male pleasant patient above history of primary myelofibrosis Jak 2+ currently on Jakafi 10 mg BID [renally dosed] and also chronic kidney disease/FSG on prednisone is here for follow-up.   Patient continues to feel fatigued.  Has not been started on dialysis yet.   Continues to have shortness of breath on exertion.  Mild swelling in the legs.  No fevers.  No night sweats.  Appetite is fair.  No weight loss.   Review of Systems  Constitutional: Positive for malaise/fatigue. Negative for chills, diaphoresis, fever and weight loss.  HENT: Negative for nosebleeds and sore throat.   Eyes: Negative for double vision.  Respiratory: Positive for shortness of breath. Negative for cough, hemoptysis, sputum production and wheezing.   Cardiovascular: Positive for leg swelling. Negative for chest pain, palpitations  and orthopnea.  Gastrointestinal: Negative for abdominal pain, blood in stool, constipation, diarrhea, heartburn, melena, nausea and vomiting.  Genitourinary: Negative for dysuria, frequency and urgency.  Musculoskeletal: Negative for back pain and joint pain.  Skin: Negative.  Negative for itching and rash.  Neurological: Negative for dizziness, tingling, focal weakness, weakness and headaches.  Endo/Heme/Allergies: Does not bruise/bleed easily.  Psychiatric/Behavioral: Negative for depression. The patient is not nervous/anxious and does not have insomnia.       PAST MEDICAL HISTORY :  Past Medical History:  Diagnosis Date  . Acute renal failure (ARF) ([place]) 05/15/2017  . Anemia   . Benign hypertensive renal disease   . CAD (coronary artery disease)    a. 08/1996 s/p BMS to the mLAD ([facility]); b. 12/2017 MV: EF 46%, fixed apical defect w/ significant GI uptake artifact. No ischemia-> low risk.  . CKD (chronic kidney disease), stage IV ([place])   . Depression   . Diastolic dysfunction    a. 12/2017 Echo: EF 60-65%, no rwma, Gr1 DD, mild AI/MR. Nl RVSP.  [name] Kitchen Dyspnea    with exertion  . FSGS (focal segmental glomerulosclerosis)   . Hyperlipidemia 10/2002  . Hypertension   . Myelofibrosis ([place]) 2010  . Smoker   . Thrombocytopenia ([place])     PAST SURGICAL HISTORY :   Past Surgical History:  Procedure Laterality Date  . ANGIOPLASTY    . BONE MARROW ASPIRATION  03/2009  . CARDIAC CATHETERIZATION    . COLONOSCOPY WITH PROPOFOL N/A 08/09/2018   Procedure: COLONOSCOPY WITH PROPOFOL;  Surgeon: [name], MD;  Location: [facility] ENDOSCOPY;  Service: Gastroenterology;  Laterality: N/A;  .  CORONARY ARTERY BYPASS GRAFT    . CORONARY STENT PLACEMENT  08/1996    FAMILY HISTORY :   Family History  Problem Relation Age of Onset  . Stroke Mother        Low BP stroke  . Depression Father   . Depression Sister        Breast  . Heart disease Other   . Breast cancer Other   . Lung cancer Other   .  Ovarian cancer Other   . Stomach cancer Other     SOCIAL HISTORY:   Social History   Tobacco Use  . Smoking status: Former Smoker    Packs/day: 1.00    Types: Cigarettes    Quit date: 01/11/2018    Years since quitting: 1.1  . Smokeless tobacco: Never Used  Substance Use Topics  . Alcohol use: No    Alcohol/week: 0.0 standard drinks    Frequency: Never    Comment: patient states he has not consumed alcohol in the last month  . Drug use: No    ALLERGIES:  has No Known Allergies.  MEDICATIONS:  Current Outpatient Medications  Medication Sig Dispense Refill  . allopurinol (ZYLOPRIM) 100 MG tablet Take 0.5 tablets (50 mg total) by mouth daily. 45 tablet 3  . carvedilol (COREG) 12.5 MG tablet Take 1 tablet (12.5 mg total) by mouth 2 (two) times daily. 180 tablet 0  . citalopram (CELEXA) 20 MG tablet TAKE 1 TABLET(20 MG) BY MOUTH DAILY 30 tablet 0  . furosemide (LASIX) 20 MG tablet Take 1 tablet (20 mg total) by mouth daily. 90 tablet 0  . JAKAFI 10 MG tablet TAKE 1 TABLET (10 MG TOTAL) BY MOUTH TWO TIMES DAILY. 60 tablet 4  . predniSONE (DELTASONE) 10 MG tablet Take 10 mg by mouth daily with breakfast.    . sodium polystyrene (KAYEXALATE) powder 15 ml every 4-6 hours; until 2 loose stools. 454 g 0  . traMADol (ULTRAM) 50 MG tablet Take 1 tablet (50 mg total) by mouth every 8 (eight) hours as needed. 21 tablet 0  . amLODipine (NORVASC) 10 MG tablet Take 1 tablet (10 mg total) by mouth daily. 90 tablet 1  . ferrous sulfate 325 (65 FE) MG tablet Take 1 tablet (325 mg total) by mouth daily with breakfast. 30 tablet 1   No current facility-administered medications for this visit.     PHYSICAL EXAMINATION: ECOG PERFORMANCE STATUS: 1 - Symptomatic but completely ambulatory  BP 123/80   Pulse 63   Temp (!) 96.8 F (36 C)   Resp 18   Wt 181 lb (82.1 kg)   BMI 29.21 kg/m   Filed Weights   03/10/19 1056  Weight: 181 lb (82.1 kg)    Physical Exam  Constitutional: He is  oriented to person, place, and time and well-developed, well-nourished, and in no distress.  He is alone.   HENT:  Head: Normocephalic and atraumatic.  Mouth/Throat: Oropharynx is clear and moist. No oropharyngeal exudate.  Eyes: Pupils are equal, round, and reactive to light.  Neck: Normal range of motion. Neck supple.  Cardiovascular: Normal rate and regular rhythm.  Pulmonary/Chest: No respiratory distress. He has no wheezes.  Decreased air entry bilaterally.  Abdominal: Soft. Bowel sounds are normal. He exhibits no distension and no mass. There is no abdominal tenderness. There is no rebound and no guarding.  Musculoskeletal: Normal range of motion.        General: No tenderness or edema.  Neurological: He is alert  and oriented to person, place, and time.  Skin: Skin is warm.  Psychiatric: Affect normal.       LABORATORY DATA:  I have reviewed the data as listed    Component Value Date/Time   NA 138 03/10/2019 1008   NA 134 07/13/2018 1101   K 4.9 03/10/2019 1008   CL 110 03/10/2019 1008   CO2 18 (L) 03/10/2019 1008   GLUCOSE 111 (H) 03/10/2019 1008   BUN 68 (H) 03/10/2019 1008   BUN 57 (H) 07/13/2018 1101   CREATININE 4.09 (H) 03/10/2019 1008   CREATININE 1.12 09/30/2013 0953   CALCIUM 8.7 (L) 03/10/2019 1008   PROT 6.5 11/19/2018 1010   PROT 5.9 (L) 07/13/2018 1101   ALBUMIN 4.0 11/19/2018 1010   ALBUMIN 4.1 07/13/2018 1101   AST 15 11/19/2018 1010   ALT 9 11/19/2018 1010   ALKPHOS 37 (L) 11/19/2018 1010   BILITOT 0.6 11/19/2018 1010   BILITOT 0.3 07/13/2018 1101   GFRNONAA 15 (L) 03/10/2019 1008   GFRNONAA >60 09/30/2013 0953   GFRAA 17 (L) 03/10/2019 1008   GFRAA >60 09/30/2013 0953    No results found for: SPEP, UPEP  Lab Results  Component Value Date   WBC 3.9 (L) 03/10/2019   NEUTROABS 3.2 03/10/2019   HGB 10.1 (L) 03/10/2019   HCT 31.0 (L) 03/10/2019   MCV 89.6 03/10/2019   PLT 62 (L) 03/10/2019      Chemistry      Component Value Date/Time    NA 138 03/10/2019 1008   NA 134 07/13/2018 1101   K 4.9 03/10/2019 1008   CL 110 03/10/2019 1008   CO2 18 (L) 03/10/2019 1008   BUN 68 (H) 03/10/2019 1008   BUN 57 (H) 07/13/2018 1101   CREATININE 4.09 (H) 03/10/2019 1008   CREATININE 1.12 09/30/2013 0953      Component Value Date/Time   CALCIUM 8.7 (L) 03/10/2019 1008   ALKPHOS 37 (L) 11/19/2018 1010   AST 15 11/19/2018 1010   ALT 9 11/19/2018 1010   BILITOT 0.6 11/19/2018 1010   BILITOT 0.3 07/13/2018 1101       RADIOGRAPHIC STUDIES: I have personally reviewed the radiological images as listed and agreed with the findings in the report. No results found.   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  Clinically stable/no significant improvement.  #Discussed regarding a repeat bone marrow biopsy to evaluate the status of the disease.  Wants to hold off for now.  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  # HTN- poorly controlled; recommend close monitoring.  #CKD stage IV-V [Dr. Latif] S/p Kidney Biopsy- FSG; GFR ~15; continue to hold off dialysis.  Discussed with Dr. Zollie Scale.  # DISPOSITION:labs copy # NO ferrahem today # cancel retacrit tomorrow.  # in 2 weeks- labs-cbc/bmp-retacrit injection #  in 4 weeks- labs-cbc/bmp-retacrit injection # follow up in 6 weeks-labs- MD-cbc/bmp/LDH-retacrit; US spleen prior- Dr.B    Cc; Dr.[name]    Orders Placed This Encounter  Procedures  . US SPLEEN (ABDOMEN LIMITED)    Standing Status:   Future    Standing Expiration Date:   05/09/2020    Order Specific Question:   Reason for Exam (SYMPTOM  OR DIAGNOSIS REQUIRED)    Answer:   splenomegaly  Order Specific Question:   Preferred imaging location?    Answer:   [facility]   All  questions were answered. The patient knows to call the clinic with any problems, questions or concerns.     Cammie Sickle, MD 03/10/2019 7:58 PM

## 2019-03-10 NOTE — Assessment & Plan Note (Addendum)
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  Clinically stable/no significant improvement.  #Discussed regarding a repeat bone marrow biopsy to evaluate the status of the disease.  Wants to hold off for now.  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  # HTN- poorly controlled; recommend close monitoring.  #CKD stage IV-V [Dr. Latif] S/p Kidney Biopsy- FSG; GFR ~15; continue to hold off dialysis.  Discussed with Dr. Zollie Scale.  # DISPOSITION:labs copy # NO ferrahem today # cancel retacrit tomorrow.  # in 2 weeks- labs-cbc/bmp-retacrit injection #  in 4 weeks- labs-cbc/bmp-retacrit injection # follow up in 6 weeks-labs- MD-cbc/bmp/LDH-retacrit; US spleen prior- Dr.B    Cc; Dr.[name]

## 2019-03-11 ENCOUNTER — Inpatient Hospital Stay: Payer: Medicare HMO

## 2019-03-13 NOTE — Progress Notes (Signed)
BP 133/81   Pulse 65   Temp 98.5 F (36.9 C) (Oral)   Ht 5\' 7"  (1.702 m)   Wt 178 lb (80.7 kg)   SpO2 98%   BMI 27.88 kg/m    Subjective:    [name], male    DOB: 14-Jun-1956, 63 y.o.   MRN: [id number]  HPI: James Arnold is a 63 y.o. male  Chief Complaint  Patient presents with  . Hypertension  . Hyperlipidemia  . Gout  . Depression   HYPERTENSION / HYPERLIPIDEMIA Satisfied with current treatment? yes Duration of hypertension: chronic BP monitoring frequency: not checking BP medication side effects: no Past BP meds: amlodipine, carvedilol Duration of hyperlipidemia: chronic Cholesterol medication side effects: no Cholesterol supplements: none Past cholesterol medications: not on any Medication compliance: excellent compliance Aspirin: yes Recent stressors: yes Recurrent headaches: no Visual changes: no Palpitations: no Dyspnea: no Chest pain: no Lower extremity edema: no Dizzy/lightheaded: no  DEPRESSION Mood status: stable Satisfied with current treatment?: yes Symptom severity: mild  Duration of current treatment : chronic Side effects: no Medication compliance: excellent compliance Psychotherapy/counseling: no  Previous psychiatric medications: celexa Depressed mood: no Anxious mood: no Anhedonia: no Significant weight loss or gain: no Insomnia: no  Fatigue: no Feelings of worthlessness or guilt: no Impaired concentration/indecisiveness: no Suicidal ideations: no Hopelessness: no Crying spells: no Depression screen [facility] 2/9 03/14/2019 07/13/2018 02/19/2018 01/07/2018 08/27/2017  Decreased Interest 1 1 1 1 1   Down, Depressed, Hopeless 1 0 0 1 1  PHQ - 2 Score 2 1 1 2 2   Altered sleeping 0 1 2 2 2   Tired, decreased energy 1 2 2 1 2   Change in appetite 1 0 0 0 2  Feeling bad or failure about yourself  0 0 0 0 0  Trouble concentrating 1 0 0 0 0  Moving slowly or fidgety/restless 0 0 0 1 0  Suicidal thoughts 0 - 0 0 0  PHQ-9  Score 5 4 5 6 8   Difficult doing work/chores Not difficult at all Not difficult at all Not difficult at all Somewhat difficult Not difficult at all   GOUT- No flares, feeling good  Relevant past medical, surgical, family and social history reviewed and updated as indicated. Interim medical history since our last visit reviewed. Allergies and medications reviewed and updated.  Review of Systems  Constitutional: Positive for fatigue. Negative for activity change, appetite change, chills, diaphoresis, fever and unexpected weight change.  Respiratory: Negative.   Cardiovascular: Negative.   Gastrointestinal: Negative.   Neurological: Negative.   Psychiatric/Behavioral: Negative.     Per HPI unless specifically indicated above     Objective:    BP 133/81   Pulse 65   Temp 98.5 F (36.9 C) (Oral)   Ht 5\' 7"  (1.702 m)   Wt 178 lb (80.7 kg)   SpO2 98%   BMI 27.88 kg/m   Wt Readings from Last 3 Encounters:  03/14/19 178 lb (80.7 kg)  03/10/19 181 lb (82.1 kg)  12/17/18 178 lb 9.6 oz (81 kg)    Physical Exam Vitals signs and nursing note reviewed.  Constitutional:      General: He is not in acute distress.    Appearance: Normal appearance. He is not ill-appearing, toxic-appearing or diaphoretic.  HENT:     Head: Normocephalic and atraumatic.     Right Ear: External ear normal.     Left Ear: External ear normal.     Nose: Nose normal.  Mouth/Throat:     Mouth: Mucous membranes are moist.     Pharynx: Oropharynx is clear.  Eyes:     General: No scleral icterus.       Right eye: No discharge.        Left eye: No discharge.     Extraocular Movements: Extraocular movements intact.     Conjunctiva/sclera: Conjunctivae normal.     Pupils: Pupils are equal, round, and reactive to light.  Neck:     Musculoskeletal: Normal range of motion and neck supple.  Cardiovascular:     Rate and Rhythm: Normal rate and regular rhythm.     Pulses: Normal pulses.     Heart sounds:  Normal heart sounds. No murmur. No friction rub. No gallop.   Pulmonary:     Effort: Pulmonary effort is normal. No respiratory distress.     Breath sounds: Normal breath sounds. No stridor. No wheezing, rhonchi or rales.  Chest:     Chest wall: No tenderness.  Musculoskeletal: Normal range of motion.  Skin:    General: Skin is warm and dry.     Capillary Refill: Capillary refill takes less than 2 seconds.     Coloration: Skin is not jaundiced or pale.     Findings: No bruising, erythema, lesion or rash.  Neurological:     General: No focal deficit present.     Mental Status: He is alert and oriented to person, place, and time. Mental status is at baseline.  Psychiatric:        Mood and Affect: Mood normal.        Behavior: Behavior normal.        Thought Content: Thought content normal.        Judgment: Judgment normal.     Results for orders placed or performed in visit on 58/85/02  Basic metabolic panel  Result Value Ref Range   Sodium 138 135 - 145 mmol/L   Potassium 4.9 3.5 - 5.1 mmol/L   Chloride 110 98 - 111 mmol/L   CO2 18 (L) 22 - 32 mmol/L   Glucose, Bld 111 (H) 70 - 99 mg/dL   BUN 68 (H) 8 - 23 mg/dL   Creatinine, Ser 4.09 (H) 0.61 - 1.24 mg/dL   Calcium 8.7 (L) 8.9 - 10.3 mg/dL   GFR calc non Af Amer 15 (L) >60 mL/min   GFR calc Af Amer 17 (L) >60 mL/min   Anion gap 10 5 - 15  CBC with Differential  Result Value Ref Range   WBC 3.9 (L) 4.0 - 10.5 K/uL   RBC 3.46 (L) 4.22 - 5.81 MIL/uL   Hemoglobin 10.1 (L) 13.0 - 17.0 g/dL   HCT 31.0 (L) 39.0 - 52.0 %   MCV 89.6 80.0 - 100.0 fL   MCH 29.2 26.0 - 34.0 pg   MCHC 32.6 30.0 - 36.0 g/dL   RDW 18.7 (H) 11.5 - 15.5 %   Platelets 62 (L) 150 - 400 K/uL   nRBC 0.8 (H) 0.0 - 0.2 %   Neutrophils Relative % 81 %   Neutro Abs 3.2 1.7 - 7.7 K/uL   Lymphocytes Relative 14 %   Lymphs Abs 0.6 (L) 0.7 - 4.0 K/uL   Monocytes Relative 4 %   Monocytes Absolute 0.1 0.1 - 1.0 K/uL   Eosinophils Relative 0 %   Eosinophils  Absolute 0.0 0.0 - 0.5 K/uL   Basophils Relative 0 %   Basophils Absolute 0.0 0.0 - 0.1 K/uL  Immature Granulocytes 1 %   Abs Immature Granulocytes 0.05 0.00 - 0.07 K/uL      Assessment & Plan:   Problem List Items Addressed This Visit      Endocrine   Secondary hyperparathyroidism [facility])    Follows with nephrology. Continue to monitor. Call with any concerns.         Genitourinary   Benign hypertensive renal disease - Primary    Under good control on current regimen. Continue current regimen. Continue to monitor. Call with any concerns. Refills given. Labs drawn today.        Relevant Medications   furosemide (LASIX) 20 MG tablet   Other Relevant Orders   Comprehensive metabolic panel   Microalbumin, Urine Waived   CKD (chronic kidney disease) stage 4, GFR 15-29 ml/min (HCC)    Kidneys were doing worse and had peritoneal catheter placed for dialysis. Kidney function has improved, and they are holding off on dialysis at this time. Goal would be to remove catheter. He is going for a class on dialysis next week. Does not have a set up for fistula at this time. Continues to follow with nephrology closely and has appointment with them in the next couple of weeks.       Anemia of chronic kidney failure, stage 3 (moderate) ([place])    Follows with hematology. Continue to monitor. Call with any concerns.       Relevant Medications   epoetin alfa-epbx (RETACRIT) 44034 UNIT/ML injection   FSGS (focal segmental glomerulosclerosis)    Continues to follow with nephrology. Call with any concerns. Continue to monitor.         Other   Primary myelofibrosis ([place])    Continues to follow with oncology. Doing well. No other concerns at this time.       Relevant Medications   aspirin EC 81 MG tablet   allopurinol (ZYLOPRIM) 100 MG tablet   Hyperlipidemia    Under good control on current regimen. Continue current regimen. Continue to monitor. Call with any concerns. Refills given. Labs  drawn today.        Relevant Medications   aspirin EC 81 MG tablet   furosemide (LASIX) 20 MG tablet   carvedilol (COREG) 12.5 MG tablet   amLODipine (NORVASC) 10 MG tablet   Other Relevant Orders   Comprehensive metabolic panel   Lipid Panel w/o Chol/HDL Ratio   Depression    Under good control on current regimen. Continue current regimen. Continue to monitor. Call with any concerns. Refills given. Labs drawn today.        Relevant Medications   citalopram (CELEXA) 20 MG tablet   Other Relevant Orders   Comprehensive metabolic panel   Gout    Under good control on current regimen. Continue current regimen. Continue to monitor. Call with any concerns. Refills given. Labs drawn today.        Relevant Orders   Comprehensive metabolic panel   Uric acid       Follow up plan: Return in about 6 months (around 09/13/2019) for wellness/physical.

## 2019-03-14 ENCOUNTER — Encounter: Payer: Self-pay | Admitting: Family Medicine

## 2019-03-14 ENCOUNTER — Ambulatory Visit (INDEPENDENT_AMBULATORY_CARE_PROVIDER_SITE_OTHER): Payer: Medicare HMO | Admitting: Family Medicine

## 2019-03-14 ENCOUNTER — Other Ambulatory Visit: Payer: Self-pay

## 2019-03-14 VITALS — BP 133/81 | HR 65 | Temp 98.5°F | Ht 67.0 in | Wt 178.0 lb

## 2019-03-14 DIAGNOSIS — N051 Unspecified nephritic syndrome with focal and segmental glomerular lesions: Secondary | ICD-10-CM

## 2019-03-14 DIAGNOSIS — N183 Chronic kidney disease, stage 3 unspecified: Secondary | ICD-10-CM

## 2019-03-14 DIAGNOSIS — N2581 Secondary hyperparathyroidism of renal origin: Secondary | ICD-10-CM | POA: Diagnosis not present

## 2019-03-14 DIAGNOSIS — M109 Gout, unspecified: Secondary | ICD-10-CM

## 2019-03-14 DIAGNOSIS — N184 Chronic kidney disease, stage 4 (severe): Secondary | ICD-10-CM

## 2019-03-14 DIAGNOSIS — E782 Mixed hyperlipidemia: Secondary | ICD-10-CM

## 2019-03-14 DIAGNOSIS — F339 Major depressive disorder, recurrent, unspecified: Secondary | ICD-10-CM

## 2019-03-14 DIAGNOSIS — D471 Chronic myeloproliferative disease: Secondary | ICD-10-CM

## 2019-03-14 DIAGNOSIS — I129 Hypertensive chronic kidney disease with stage 1 through stage 4 chronic kidney disease, or unspecified chronic kidney disease: Secondary | ICD-10-CM | POA: Diagnosis not present

## 2019-03-14 DIAGNOSIS — D631 Anemia in chronic kidney disease: Secondary | ICD-10-CM

## 2019-03-14 LAB — MICROALBUMIN, URINE WAIVED
Creatinine, Urine Waived: 50 mg/dL (ref 10–300)
Microalb, Ur Waived: 150 mg/L — ABNORMAL HIGH (ref 0–19)
Microalb/Creat Ratio: >300 mg/g — ABNORMAL HIGH (ref <30)

## 2019-03-14 MED ORDER — CARVEDILOL 12.5 MG PO TABS: 12.5000 mg | ORAL_TABLET | Freq: Two times a day (BID) | ORAL | 1 refills | Status: DC

## 2019-03-14 MED ORDER — CITALOPRAM HYDROBROMIDE 20 MG PO TABS: ORAL_TABLET | ORAL | 1 refills | Status: DC

## 2019-03-14 MED ORDER — ALLOPURINOL 100 MG PO TABS: 50.0000 mg | ORAL_TABLET | Freq: Every day | ORAL | 3 refills | Status: DC

## 2019-03-14 MED ORDER — AMLODIPINE BESYLATE 10 MG PO TABS: 10.0000 mg | ORAL_TABLET | Freq: Every day | ORAL | 1 refills | Status: DC

## 2019-03-14 MED ORDER — FUROSEMIDE 20 MG PO TABS: 20.0000 mg | ORAL_TABLET | Freq: Every day | ORAL | 1 refills | Status: DC

## 2019-03-14 NOTE — Assessment & Plan Note (Signed)
Continues to follow with oncology. Doing well. No other concerns at this time.

## 2019-03-14 NOTE — Assessment & Plan Note (Signed)
Follows with nephrology. Continue to monitor. Call with any concerns.

## 2019-03-14 NOTE — Assessment & Plan Note (Signed)
Follows with hematology. Continue to monitor. Call with any concerns.

## 2019-03-14 NOTE — Assessment & Plan Note (Signed)
Under good control on current regimen. Continue current regimen. Continue to monitor. Call with any concerns. Refills given. Labs drawn today.   

## 2019-03-14 NOTE — Assessment & Plan Note (Signed)
Continues to follow with nephrology. Call with any concerns. Continue to monitor.

## 2019-03-14 NOTE — Assessment & Plan Note (Addendum)
Kidneys were doing worse and had peritoneal catheter placed for dialysis. Kidney function has improved, and they are holding off on dialysis at this time. Goal would be to remove catheter. He is going for a class on dialysis next week. Does not have a set up for fistula at this time. Continues to follow with nephrology closely and has appointment with them in the next couple of weeks.

## 2019-03-15 ENCOUNTER — Encounter: Payer: Self-pay | Admitting: Family Medicine

## 2019-03-15 DIAGNOSIS — N186 End stage renal disease: Secondary | ICD-10-CM | POA: Diagnosis not present

## 2019-03-15 DIAGNOSIS — Z992 Dependence on renal dialysis: Secondary | ICD-10-CM | POA: Diagnosis not present

## 2019-03-15 LAB — COMPREHENSIVE METABOLIC PANEL
ALT: 15 IU/L (ref 0–44)
AST: 12 IU/L (ref 0–40)
Albumin/Globulin Ratio: 3.1 — ABNORMAL HIGH (ref 1.2–2.2)
Albumin: 4.6 g/dL (ref 3.8–4.8)
Alkaline Phosphatase: 39 IU/L (ref 39–117)
BUN/Creatinine Ratio: 15 (ref 10–24)
BUN: 58 mg/dL — ABNORMAL HIGH (ref 8–27)
Bilirubin Total: 0.4 mg/dL (ref 0.0–1.2)
CO2: 17 mmol/L — ABNORMAL LOW (ref 20–29)
Calcium: 8.7 mg/dL (ref 8.6–10.2)
Chloride: 107 mmol/L — ABNORMAL HIGH (ref 96–106)
Creatinine, Ser: 3.75 mg/dL — ABNORMAL HIGH (ref 0.76–1.27)
GFR calc Af Amer: 19 mL/min/{1.73_m2} — ABNORMAL LOW (ref >59)
GFR calc non Af Amer: 16 mL/min/{1.73_m2} — ABNORMAL LOW (ref >59)
Globulin, Total: 1.5 g/dL (ref 1.5–4.5)
Glucose: 97 mg/dL (ref 65–99)
Potassium: 4.7 mmol/L (ref 3.5–5.2)
Sodium: 137 mmol/L (ref 134–144)
Total Protein: 6.1 g/dL (ref 6.0–8.5)

## 2019-03-15 LAB — LIPID PANEL W/O CHOL/HDL RATIO
Cholesterol, Total: 194 mg/dL (ref 100–199)
HDL: 35 mg/dL — ABNORMAL LOW (ref >39)
LDL Calculated: 134 mg/dL — ABNORMAL HIGH (ref 0–99)
Triglycerides: 126 mg/dL (ref 0–149)
VLDL Cholesterol Cal: 25 mg/dL (ref 5–40)

## 2019-03-15 LAB — URIC ACID: Uric Acid: 8.8 mg/dL — ABNORMAL HIGH (ref 3.7–8.6)

## 2019-03-16 DIAGNOSIS — Z992 Dependence on renal dialysis: Secondary | ICD-10-CM | POA: Diagnosis not present

## 2019-03-16 DIAGNOSIS — N186 End stage renal disease: Secondary | ICD-10-CM | POA: Diagnosis not present

## 2019-03-17 DIAGNOSIS — Z125 Encounter for screening for malignant neoplasm of prostate: Secondary | ICD-10-CM | POA: Diagnosis not present

## 2019-03-17 DIAGNOSIS — I259 Chronic ischemic heart disease, unspecified: Secondary | ICD-10-CM | POA: Diagnosis not present

## 2019-03-17 DIAGNOSIS — N186 End stage renal disease: Secondary | ICD-10-CM | POA: Diagnosis not present

## 2019-03-17 DIAGNOSIS — Z992 Dependence on renal dialysis: Secondary | ICD-10-CM | POA: Diagnosis not present

## 2019-03-18 DIAGNOSIS — Z992 Dependence on renal dialysis: Secondary | ICD-10-CM | POA: Diagnosis not present

## 2019-03-18 DIAGNOSIS — N186 End stage renal disease: Secondary | ICD-10-CM | POA: Diagnosis not present

## 2019-03-23 DIAGNOSIS — N186 End stage renal disease: Secondary | ICD-10-CM | POA: Diagnosis not present

## 2019-03-23 DIAGNOSIS — Z992 Dependence on renal dialysis: Secondary | ICD-10-CM | POA: Diagnosis not present

## 2019-03-24 ENCOUNTER — Other Ambulatory Visit: Payer: Self-pay | Admitting: Internal Medicine

## 2019-03-24 ENCOUNTER — Other Ambulatory Visit: Payer: Medicare HMO

## 2019-03-24 ENCOUNTER — Ambulatory Visit: Payer: Medicare HMO

## 2019-03-24 DIAGNOSIS — N186 End stage renal disease: Secondary | ICD-10-CM | POA: Diagnosis not present

## 2019-03-24 DIAGNOSIS — Z992 Dependence on renal dialysis: Secondary | ICD-10-CM | POA: Diagnosis not present

## 2019-03-24 DIAGNOSIS — D471 Chronic myeloproliferative disease: Secondary | ICD-10-CM

## 2019-03-25 DIAGNOSIS — Z992 Dependence on renal dialysis: Secondary | ICD-10-CM | POA: Diagnosis not present

## 2019-03-25 DIAGNOSIS — N186 End stage renal disease: Secondary | ICD-10-CM | POA: Diagnosis not present

## 2019-03-28 DIAGNOSIS — Z992 Dependence on renal dialysis: Secondary | ICD-10-CM | POA: Diagnosis not present

## 2019-03-28 DIAGNOSIS — N186 End stage renal disease: Secondary | ICD-10-CM | POA: Diagnosis not present

## 2019-03-29 DIAGNOSIS — Z992 Dependence on renal dialysis: Secondary | ICD-10-CM | POA: Diagnosis not present

## 2019-03-29 DIAGNOSIS — N186 End stage renal disease: Secondary | ICD-10-CM | POA: Diagnosis not present

## 2019-03-30 DIAGNOSIS — Z992 Dependence on renal dialysis: Secondary | ICD-10-CM | POA: Diagnosis not present

## 2019-03-30 DIAGNOSIS — N186 End stage renal disease: Secondary | ICD-10-CM | POA: Diagnosis not present

## 2019-03-30 MED FILL — JAKAFI 10 MG TABLET: 10 | 30 days supply | Qty: 60 | Fill #0

## 2019-03-31 DIAGNOSIS — Z992 Dependence on renal dialysis: Secondary | ICD-10-CM | POA: Diagnosis not present

## 2019-03-31 DIAGNOSIS — N186 End stage renal disease: Secondary | ICD-10-CM | POA: Diagnosis not present

## 2019-04-01 DIAGNOSIS — Z992 Dependence on renal dialysis: Secondary | ICD-10-CM | POA: Diagnosis not present

## 2019-04-01 DIAGNOSIS — N186 End stage renal disease: Secondary | ICD-10-CM | POA: Diagnosis not present

## 2019-04-02 DIAGNOSIS — N186 End stage renal disease: Secondary | ICD-10-CM | POA: Diagnosis not present

## 2019-04-02 DIAGNOSIS — Z992 Dependence on renal dialysis: Secondary | ICD-10-CM | POA: Diagnosis not present

## 2019-04-03 DIAGNOSIS — Z992 Dependence on renal dialysis: Secondary | ICD-10-CM | POA: Diagnosis not present

## 2019-04-03 DIAGNOSIS — N186 End stage renal disease: Secondary | ICD-10-CM | POA: Diagnosis not present

## 2019-04-04 DIAGNOSIS — Z992 Dependence on renal dialysis: Secondary | ICD-10-CM | POA: Diagnosis not present

## 2019-04-04 DIAGNOSIS — N186 End stage renal disease: Secondary | ICD-10-CM | POA: Diagnosis not present

## 2019-04-05 DIAGNOSIS — Z992 Dependence on renal dialysis: Secondary | ICD-10-CM | POA: Diagnosis not present

## 2019-04-05 DIAGNOSIS — N186 End stage renal disease: Secondary | ICD-10-CM | POA: Diagnosis not present

## 2019-04-05 DIAGNOSIS — Z125 Encounter for screening for malignant neoplasm of prostate: Secondary | ICD-10-CM | POA: Diagnosis not present

## 2019-04-05 DIAGNOSIS — I259 Chronic ischemic heart disease, unspecified: Secondary | ICD-10-CM | POA: Diagnosis not present

## 2019-04-06 DIAGNOSIS — Z992 Dependence on renal dialysis: Secondary | ICD-10-CM | POA: Diagnosis not present

## 2019-04-06 DIAGNOSIS — N186 End stage renal disease: Secondary | ICD-10-CM | POA: Diagnosis not present

## 2019-04-07 ENCOUNTER — Other Ambulatory Visit: Payer: Medicare HMO

## 2019-04-07 ENCOUNTER — Ambulatory Visit: Payer: Medicare HMO

## 2019-04-07 DIAGNOSIS — N186 End stage renal disease: Secondary | ICD-10-CM | POA: Diagnosis not present

## 2019-04-07 DIAGNOSIS — Z992 Dependence on renal dialysis: Secondary | ICD-10-CM | POA: Diagnosis not present

## 2019-04-08 DIAGNOSIS — N186 End stage renal disease: Secondary | ICD-10-CM | POA: Diagnosis not present

## 2019-04-08 DIAGNOSIS — Z992 Dependence on renal dialysis: Secondary | ICD-10-CM | POA: Diagnosis not present

## 2019-04-09 DIAGNOSIS — N186 End stage renal disease: Secondary | ICD-10-CM | POA: Diagnosis not present

## 2019-04-09 DIAGNOSIS — Z992 Dependence on renal dialysis: Secondary | ICD-10-CM | POA: Diagnosis not present

## 2019-04-10 DIAGNOSIS — N186 End stage renal disease: Secondary | ICD-10-CM | POA: Diagnosis not present

## 2019-04-10 DIAGNOSIS — Z992 Dependence on renal dialysis: Secondary | ICD-10-CM | POA: Diagnosis not present

## 2019-04-11 DIAGNOSIS — N186 End stage renal disease: Secondary | ICD-10-CM | POA: Diagnosis not present

## 2019-04-11 DIAGNOSIS — Z992 Dependence on renal dialysis: Secondary | ICD-10-CM | POA: Diagnosis not present

## 2019-04-12 DIAGNOSIS — Z992 Dependence on renal dialysis: Secondary | ICD-10-CM | POA: Diagnosis not present

## 2019-04-12 DIAGNOSIS — N186 End stage renal disease: Secondary | ICD-10-CM | POA: Diagnosis not present

## 2019-04-13 DIAGNOSIS — Z992 Dependence on renal dialysis: Secondary | ICD-10-CM | POA: Diagnosis not present

## 2019-04-13 DIAGNOSIS — N186 End stage renal disease: Secondary | ICD-10-CM | POA: Diagnosis not present

## 2019-04-14 ENCOUNTER — Ambulatory Visit
Admission: RE | Admit: 2019-04-14 | Discharge: 2019-04-14 | Disposition: A | Discharge status: Home / Self Care | Payer: Medicare HMO | Source: Ambulatory Visit | Attending: Internal Medicine | Admitting: Internal Medicine

## 2019-04-14 ENCOUNTER — Other Ambulatory Visit: Payer: Self-pay

## 2019-04-14 DIAGNOSIS — D471 Chronic myeloproliferative disease: Secondary | ICD-10-CM | POA: Diagnosis not present

## 2019-04-14 DIAGNOSIS — N186 End stage renal disease: Secondary | ICD-10-CM | POA: Diagnosis not present

## 2019-04-14 DIAGNOSIS — Z992 Dependence on renal dialysis: Secondary | ICD-10-CM | POA: Diagnosis not present

## 2019-04-14 DIAGNOSIS — R161 Splenomegaly, not elsewhere classified: Secondary | ICD-10-CM | POA: Diagnosis not present

## 2019-04-15 DIAGNOSIS — Z992 Dependence on renal dialysis: Secondary | ICD-10-CM | POA: Diagnosis not present

## 2019-04-15 DIAGNOSIS — N186 End stage renal disease: Secondary | ICD-10-CM | POA: Diagnosis not present

## 2019-04-16 DIAGNOSIS — N186 End stage renal disease: Secondary | ICD-10-CM | POA: Diagnosis not present

## 2019-04-16 DIAGNOSIS — Z992 Dependence on renal dialysis: Secondary | ICD-10-CM | POA: Diagnosis not present

## 2019-04-17 DIAGNOSIS — N186 End stage renal disease: Secondary | ICD-10-CM | POA: Diagnosis not present

## 2019-04-17 DIAGNOSIS — Z992 Dependence on renal dialysis: Secondary | ICD-10-CM | POA: Diagnosis not present

## 2019-04-18 DIAGNOSIS — Z992 Dependence on renal dialysis: Secondary | ICD-10-CM | POA: Diagnosis not present

## 2019-04-18 DIAGNOSIS — N186 End stage renal disease: Secondary | ICD-10-CM | POA: Diagnosis not present

## 2019-04-19 ENCOUNTER — Telehealth: Payer: Self-pay | Admitting: *Deleted

## 2019-04-19 DIAGNOSIS — N186 End stage renal disease: Secondary | ICD-10-CM | POA: Diagnosis not present

## 2019-04-19 DIAGNOSIS — Z992 Dependence on renal dialysis: Secondary | ICD-10-CM | POA: Diagnosis not present

## 2019-04-19 NOTE — Telephone Encounter (Signed)
Per scheduling, patient now receiving retacrit at his dialysis. Per Dr. B please have patient keep upcoming apts with Dr. B and labs due to monitoring of myelofibrosis.

## 2019-04-20 DIAGNOSIS — Z992 Dependence on renal dialysis: Secondary | ICD-10-CM | POA: Diagnosis not present

## 2019-04-20 DIAGNOSIS — N186 End stage renal disease: Secondary | ICD-10-CM | POA: Diagnosis not present

## 2019-04-21 ENCOUNTER — Ambulatory Visit: Payer: Medicare HMO

## 2019-04-21 ENCOUNTER — Other Ambulatory Visit: Payer: Medicare HMO

## 2019-04-21 ENCOUNTER — Inpatient Hospital Stay: Payer: Medicare HMO | Admitting: Internal Medicine

## 2019-04-21 DIAGNOSIS — N186 End stage renal disease: Secondary | ICD-10-CM | POA: Diagnosis not present

## 2019-04-21 DIAGNOSIS — Z992 Dependence on renal dialysis: Secondary | ICD-10-CM | POA: Diagnosis not present

## 2019-04-22 DIAGNOSIS — Z992 Dependence on renal dialysis: Secondary | ICD-10-CM | POA: Diagnosis not present

## 2019-04-22 DIAGNOSIS — N186 End stage renal disease: Secondary | ICD-10-CM | POA: Diagnosis not present

## 2019-04-23 DIAGNOSIS — Z992 Dependence on renal dialysis: Secondary | ICD-10-CM | POA: Diagnosis not present

## 2019-04-23 DIAGNOSIS — Z23 Encounter for immunization: Secondary | ICD-10-CM | POA: Diagnosis not present

## 2019-04-23 DIAGNOSIS — N186 End stage renal disease: Secondary | ICD-10-CM | POA: Diagnosis not present

## 2019-04-24 DIAGNOSIS — Z992 Dependence on renal dialysis: Secondary | ICD-10-CM | POA: Diagnosis not present

## 2019-04-24 DIAGNOSIS — Z23 Encounter for immunization: Secondary | ICD-10-CM | POA: Diagnosis not present

## 2019-04-24 DIAGNOSIS — N186 End stage renal disease: Secondary | ICD-10-CM | POA: Diagnosis not present

## 2019-04-25 ENCOUNTER — Other Ambulatory Visit: Payer: Self-pay

## 2019-04-25 DIAGNOSIS — Z23 Encounter for immunization: Secondary | ICD-10-CM | POA: Diagnosis not present

## 2019-04-25 DIAGNOSIS — Z992 Dependence on renal dialysis: Secondary | ICD-10-CM | POA: Diagnosis not present

## 2019-04-25 DIAGNOSIS — N186 End stage renal disease: Secondary | ICD-10-CM | POA: Diagnosis not present

## 2019-04-25 MED FILL — JAKAFI 10 MG TABLET: 10 | 30 days supply | Qty: 60 | Fill #1

## 2019-04-26 ENCOUNTER — Telehealth: Payer: Self-pay | Admitting: Internal Medicine

## 2019-04-26 ENCOUNTER — Inpatient Hospital Stay (HOSPITAL_BASED_OUTPATIENT_CLINIC_OR_DEPARTMENT_OTHER): Payer: Medicare HMO | Admitting: Internal Medicine

## 2019-04-26 ENCOUNTER — Inpatient Hospital Stay: Payer: Medicare HMO | Attending: Internal Medicine

## 2019-04-26 ENCOUNTER — Other Ambulatory Visit: Payer: Self-pay

## 2019-04-26 ENCOUNTER — Other Ambulatory Visit: Payer: Self-pay | Admitting: Internal Medicine

## 2019-04-26 ENCOUNTER — Encounter: Payer: Self-pay | Admitting: Internal Medicine

## 2019-04-26 DIAGNOSIS — N185 Chronic kidney disease, stage 5: Secondary | ICD-10-CM | POA: Insufficient documentation

## 2019-04-26 DIAGNOSIS — Z23 Encounter for immunization: Secondary | ICD-10-CM | POA: Diagnosis not present

## 2019-04-26 DIAGNOSIS — F17210 Nicotine dependence, cigarettes, uncomplicated: Secondary | ICD-10-CM | POA: Insufficient documentation

## 2019-04-26 DIAGNOSIS — N183 Chronic kidney disease, stage 3 unspecified: Secondary | ICD-10-CM

## 2019-04-26 DIAGNOSIS — D631 Anemia in chronic kidney disease: Secondary | ICD-10-CM | POA: Insufficient documentation

## 2019-04-26 DIAGNOSIS — Z7952 Long term (current) use of systemic steroids: Secondary | ICD-10-CM | POA: Insufficient documentation

## 2019-04-26 DIAGNOSIS — E785 Hyperlipidemia, unspecified: Secondary | ICD-10-CM | POA: Insufficient documentation

## 2019-04-26 DIAGNOSIS — R0609 Other forms of dyspnea: Secondary | ICD-10-CM | POA: Diagnosis not present

## 2019-04-26 DIAGNOSIS — Z992 Dependence on renal dialysis: Secondary | ICD-10-CM | POA: Insufficient documentation

## 2019-04-26 DIAGNOSIS — Z7982 Long term (current) use of aspirin: Secondary | ICD-10-CM | POA: Diagnosis not present

## 2019-04-26 DIAGNOSIS — R5383 Other fatigue: Secondary | ICD-10-CM | POA: Insufficient documentation

## 2019-04-26 DIAGNOSIS — Z79899 Other long term (current) drug therapy: Secondary | ICD-10-CM | POA: Insufficient documentation

## 2019-04-26 DIAGNOSIS — D471 Chronic myeloproliferative disease: Secondary | ICD-10-CM | POA: Diagnosis not present

## 2019-04-26 DIAGNOSIS — I2510 Atherosclerotic heart disease of native coronary artery without angina pectoris: Secondary | ICD-10-CM | POA: Insufficient documentation

## 2019-04-26 DIAGNOSIS — I120 Hypertensive chronic kidney disease with stage 5 chronic kidney disease or end stage renal disease: Secondary | ICD-10-CM | POA: Insufficient documentation

## 2019-04-26 DIAGNOSIS — N186 End stage renal disease: Secondary | ICD-10-CM | POA: Diagnosis not present

## 2019-04-26 DIAGNOSIS — R5381 Other malaise: Secondary | ICD-10-CM | POA: Insufficient documentation

## 2019-04-26 LAB — BASIC METABOLIC PANEL
Anion gap: 10 (ref 5–15)
BUN: 51 mg/dL — ABNORMAL HIGH (ref 8–23)
CO2: 24 mmol/L (ref 22–32)
Calcium: 8.7 mg/dL — ABNORMAL LOW (ref 8.9–10.3)
Chloride: 103 mmol/L (ref 98–111)
Creatinine, Ser: 4.14 mg/dL — ABNORMAL HIGH (ref 0.61–1.24)
GFR calc Af Amer: 17 mL/min — ABNORMAL LOW (ref >60)
GFR calc non Af Amer: 14 mL/min — ABNORMAL LOW (ref >60)
Glucose, Bld: 102 mg/dL — ABNORMAL HIGH (ref 70–99)
Potassium: 4.1 mmol/L (ref 3.5–5.1)
Sodium: 137 mmol/L (ref 135–145)

## 2019-04-26 LAB — CBC WITH DIFFERENTIAL/PLATELET
Abs Immature Granulocytes: 0.07 10*3/uL (ref 0.00–0.07)
Basophils Absolute: 0 10*3/uL (ref 0.0–0.1)
Basophils Relative: 0 %
Eosinophils Absolute: 0 10*3/uL (ref 0.0–0.5)
Eosinophils Relative: 0 %
HCT: 26.6 % — ABNORMAL LOW (ref 39.0–52.0)
Hemoglobin: 8.8 g/dL — ABNORMAL LOW (ref 13.0–17.0)
Immature Granulocytes: 2 %
Lymphocytes Relative: 15 %
Lymphs Abs: 0.6 10*3/uL — ABNORMAL LOW (ref 0.7–4.0)
MCH: 29.9 pg (ref 26.0–34.0)
MCHC: 33.1 g/dL (ref 30.0–36.0)
MCV: 90.5 fL (ref 80.0–100.0)
Monocytes Absolute: 0.1 10*3/uL (ref 0.1–1.0)
Monocytes Relative: 3 %
Neutro Abs: 3 10*3/uL (ref 1.7–7.7)
Neutrophils Relative %: 80 %
Platelets: 77 10*3/uL — ABNORMAL LOW (ref 150–400)
RBC: 2.94 MIL/uL — ABNORMAL LOW (ref 4.22–5.81)
RDW: 17.3 % — ABNORMAL HIGH (ref 11.5–15.5)
WBC: 3.8 10*3/uL — ABNORMAL LOW (ref 4.0–10.5)
nRBC: 1.3 % — ABNORMAL HIGH (ref 0.0–0.2)

## 2019-04-26 MED ORDER — ACYCLOVIR 200 MG PO CAPS: 200.0000 mg | ORAL_CAPSULE | Freq: Two times a day (BID) | ORAL | 6 refills | Status: DC

## 2019-04-26 MED ORDER — RUXOLITINIB PHOSPHATE 10 MG PO TABS: 10.0000 mg | ORAL_TABLET | Freq: Every day | ORAL | 4 refills | Status: DC

## 2019-04-26 NOTE — Telephone Encounter (Signed)
Spoke to patient's wife regarding directions on Jakafi-recommend 10 mg pill once a day; after finishing off peritoneal dialysis/in the mornings.  Recommend acyclovir 200 mg twice daily.   Recommend calling nephrology regarding erythropoietin injections/8.3.  Discussed with Dr. Holley Raring.

## 2019-04-26 NOTE — Progress Notes (Signed)
[place] OFFICE PROGRESS NOTE  Patient Care Team: Valerie Roys, DO as PCP - General (Family Medicine) End, Harrell Gave, MD as PCP - Cardiology (Cardiology) Cammie Sickle, MD as Consulting Physician (Internal Medicine)  Cancer Staging No matching staging information was found for the patient.   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 [name]- kidney Bx- FSG [ on Prednisone Dr.[name]]; July 2020-peritoneal dialysis  # AUGUST 1st week 2019- Retacrit  # October 2nd- jakafi 10 BID [? Renal involvement];   # hx of Lung nodules- resolved [[name]]/quit smoking.  DIAGNOSIS: PRIMARY MYELOFIBROSIS  RISK:LOW     ;GOALS: Control  CURRENT/MOST RECENT THERAPY : Jakafi     Primary myelofibrosis ([place])      INTERVAL HISTORY:  James Arnold 63 y.o.  male pleasant patient above history of primary myelofibrosis Jak 2+ currently on Jakafi 10 mg BID [renally dosed] and also chronic kidney disease/FSG on prednisone is here for follow-up.   In the interim patient has been started on dialysis by nephrology.  Patient/wife states that his hemoglobin was 10 approximately 2 to 3 weeks ago-erythropoietin injection was held by nephrology.  He states his fatigue is slightly improved.  However he is bothered by peritoneal dialysis at home.  Appetite is fair.  Denies any nausea vomiting.  No fevers chills.  Wife says patient is easily out of breath on exertion/fatigue.  Interested in a scooter.  Review of Systems  Constitutional: Positive for malaise/fatigue. Negative for chills, diaphoresis, fever and weight loss.  HENT: Negative  for nosebleeds and sore throat.   Eyes: Negative for double vision.  Respiratory: Positive for shortness of breath. Negative for cough, hemoptysis, sputum production and wheezing.   Cardiovascular: Negative for chest pain, palpitations and orthopnea.  Gastrointestinal: Negative for abdominal pain, blood in stool, constipation, diarrhea, heartburn, melena, nausea and vomiting.  Genitourinary: Negative for dysuria, frequency and urgency.  Musculoskeletal: Negative for back pain and joint pain.  Skin: Negative.  Negative for itching and rash.  Neurological: Negative for dizziness, tingling, focal weakness, weakness and headaches.  Endo/Heme/Allergies: Does not bruise/bleed easily.  Psychiatric/Behavioral: Negative for depression. The patient is not nervous/anxious and does not have insomnia.       PAST MEDICAL HISTORY :  Past Medical History:  Diagnosis Date  . Acute renal failure (ARF) ([place]) 05/15/2017  . Anemia   . Benign hypertensive renal disease   . CAD (coronary artery disease)    a. 08/1996 s/p BMS to the mLAD ([facility]); b. 12/2017 MV: EF 46%, fixed apical defect w/ significant GI uptake artifact. No ischemia-> low risk.  . CKD (chronic kidney disease), stage IV ([place])   . Depression   . Diastolic dysfunction    a. 12/2017 Echo: EF 60-65%, no rwma, Gr1 DD, mild AI/MR. Nl RVSP.  [name] Kitchen Dyspnea    with exertion  . FSGS (focal segmental glomerulosclerosis)   . Hyperlipidemia 10/2002  . Hypertension   . Myelofibrosis ([place]) 2010  . Smoker   . Thrombocytopenia ([place])     PAST SURGICAL HISTORY :   Past Surgical History:  Procedure Laterality Date  . ANGIOPLASTY    . BONE MARROW ASPIRATION  03/2009  . CARDIAC CATHETERIZATION    .  COLONOSCOPY WITH PROPOFOL N/A 08/09/2018   Procedure: COLONOSCOPY WITH PROPOFOL;  Surgeon: [name], MD;  Location[facility] ENDOSCOPY;  Service: Gastroenterology;  Laterality: N/A;  . CORONARY ARTERY BYPASS GRAFT    . CORONARY STENT PLACEMENT  08/1996    FAMILY  HISTORY :   Family History  Problem Relation Age of Onset  . Stroke Mother        Low BP stroke  . Depression Father   . Depression Sister        Breast  . Heart disease Other   . Breast cancer Other   . Lung cancer Other   . Ovarian cancer Other   . Stomach cancer Other     SOCIAL HISTORY:   Social History   Tobacco Use  . Smoking status: Current Every Day Smoker    Packs/day: 1.00    Types: Cigarettes    Last attempt to quit: 01/11/2018    Years since quitting: 1.2  . Smokeless tobacco: Never Used  Substance Use Topics  . Alcohol use: No    Alcohol/week: 0.0 standard drinks    Frequency: Never    Comment: patient states he has not consumed alcohol in the last month  . Drug use: No    ALLERGIES:  has No Known Allergies.  MEDICATIONS:  Current Outpatient Medications  Medication Sig Dispense Refill  . allopurinol (ZYLOPRIM) 100 MG tablet Take 0.5 tablets (50 mg total) by mouth daily. 45 tablet 3  . amLODipine (NORVASC) 10 MG tablet Take 1 tablet (10 mg total) by mouth daily. 90 tablet 1  . aspirin EC 81 MG tablet Take by mouth.    . calcitRIOL (ROCALTROL) 0.25 MCG capsule Take 1 capsule by mouth daily.    . carvedilol (COREG) 12.5 MG tablet Take 1 tablet (12.5 mg total) by mouth 2 (two) times daily. 180 tablet 1  . Cholecalciferol (VITAMIN D-1000 MAX ST) 25 MCG (1000 UT) tablet Take by mouth.    . citalopram (CELEXA) 20 MG tablet TAKE 1 TABLET(20 MG) BY MOUTH DAILY 90 tablet 1  . epoetin alfa-epbx (RETACRIT) 21194 UNIT/ML injection Inject into the skin.    . ferrous sulfate 325 (65 FE) MG tablet Take 1 tablet (325 mg total) by mouth daily with breakfast. 30 tablet 1  . furosemide (LASIX) 20 MG tablet Take 1 tablet (20 mg total) by mouth daily. 90 tablet 1  . JAKAFI 10 MG tablet TAKE 1 TABLET (10 MG TOTAL) BY MOUTH TWO TIMES DAILY. 60 tablet 4  . predniSONE (DELTASONE) 10 MG tablet Take 10 mg by mouth daily with breakfast.    . traMADol (ULTRAM) 50 MG tablet Take 1  tablet (50 mg total) by mouth every 8 (eight) hours as needed. 21 tablet 0   No current facility-administered medications for this visit.     PHYSICAL EXAMINATION: ECOG PERFORMANCE STATUS: 1 - Symptomatic but completely ambulatory  BP (!) 144/77   Pulse 61   Temp 98.2 F (36.8 C) (Tympanic)   Resp 20   Ht _0  (1.702 m)   Wt 180 lb 3.2 oz (81.7 kg)   BMI 28.22 kg/m   Filed Weights   04/26/19 0927  Weight: 180 lb 3.2 oz (81.7 kg)    Physical Exam  Constitutional: He is oriented to person, place, and time and well-developed, well-nourished, and in no distress.  He is alone.   HENT:  Head: Normocephalic and atraumatic.  Mouth/Throat: Oropharynx is clear and moist. No oropharyngeal exudate.  Eyes: Pupils are  equal, round, and reactive to light.  Neck: Normal range of motion. Neck supple.  Cardiovascular: Normal rate and regular rhythm.  Pulmonary/Chest: No respiratory distress. He has no wheezes.  Decreased air entry bilaterally.  Abdominal: Soft. Bowel sounds are normal. He exhibits no distension and no mass. There is no abdominal tenderness. There is no rebound and no guarding.  Musculoskeletal: Normal range of motion.        General: No tenderness or edema.  Neurological: He is alert and oriented to person, place, and time.  Skin: Skin is warm.  Psychiatric: Affect normal.       LABORATORY DATA:  I have reviewed the data as listed    Component Value Date/Time   NA 137 04/26/2019 0915   NA 137 03/14/2019 1000   K 4.1 04/26/2019 0915   CL 103 04/26/2019 0915   CO2 24 04/26/2019 0915   GLUCOSE 102 (H) 04/26/2019 0915   BUN 51 (H) 04/26/2019 0915   BUN 58 (H) 03/14/2019 1000   CREATININE 4.14 (H) 04/26/2019 0915   CREATININE 1.12 09/30/2013 0953   CALCIUM 8.7 (L) 04/26/2019 0915   PROT 6.1 03/14/2019 1000   ALBUMIN 4.6 03/14/2019 1000   AST 12 03/14/2019 1000   ALT 15 03/14/2019 1000   ALKPHOS 39 03/14/2019 1000   BILITOT 0.4 03/14/2019 1000   GFRNONAA 14  (L) 04/26/2019 0915   GFRNONAA >60 09/30/2013 0953   GFRAA 17 (L) 04/26/2019 0915   GFRAA >60 09/30/2013 0953    No results found for: SPEP, UPEP  Lab Results  Component Value Date   WBC 3.8 (L) 04/26/2019   NEUTROABS 3.0 04/26/2019   HGB 8.8 (L) 04/26/2019   HCT 26.6 (L) 04/26/2019   MCV 90.5 04/26/2019   PLT 77 (L) 04/26/2019      Chemistry      Component Value Date/Time   NA 137 04/26/2019 0915   NA 137 03/14/2019 1000   K 4.1 04/26/2019 0915   CL 103 04/26/2019 0915   CO2 24 04/26/2019 0915   BUN 51 (H) 04/26/2019 0915   BUN 58 (H) 03/14/2019 1000   CREATININE 4.14 (H) 04/26/2019 0915   CREATININE 1.12 09/30/2013 0953      Component Value Date/Time   CALCIUM 8.7 (L) 04/26/2019 0915   ALKPHOS 39 03/14/2019 1000   AST 12 03/14/2019 1000   ALT 15 03/14/2019 1000   BILITOT 0.4 03/14/2019 1000       RADIOGRAPHIC STUDIES: I have personally reviewed the radiological images as listed and agreed with the findings in the report. No results found.   ASSESSMENT & PLAN:  Primary myelofibrosis ([place]) # Primary Myelofiborisis [Bone marrow 2010] jak-2 positive;  Currently on jakafi 10 mg BID on October 2nd 2019.  Clinically stable.  # July 2020- US spleen- 630 [similar to 580 cc in Feb 2019]; will discuss with pharmacy regarding Jakafi dosing while on peritoneal dialysis.  # Anemia secondary to chronic kidney disease/myelofibrosis-status post IV iron hemoglobin 8.3- defer to Nephrology regarding EPO injections.  Left a message for Dr. Zollie Scale.  # HTN-improved.  Stable.  #CKD stage IV-V [Dr. Latif] S/p Kidney Biopsy- FSG; GFR ~14; on PD; ? Improvement in fatigue.   #Antimicrobial prophylaxis-needs acyclovir will discuss with pharmacy.  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  Family interested in a scooter; will defer to PCP for authorization.  Discussed with wife.  # DISPOSITION:labs copy # follow up in 6 weeks-labs- MD-cbc/bmp/LDH-Dr.B    Cc;  Dr.[name] /Dr.Johnson   Orders Placed  This Encounter  Procedures  . Basic metabolic panel    Standing Status:   Future    Standing Expiration Date:   04/25/2020  . CBC with Differential    Standing Status:   Future    Standing Expiration Date:   04/25/2020  . Lactate dehydrogenase    Standing Status:   Future    Standing Expiration Date:   04/25/2020   All questions were answered. The patient knows to call the clinic with any problems, questions or concerns.     Cammie Sickle, MD 04/26/2019 10:01 AM

## 2019-04-26 NOTE — Progress Notes (Signed)
Discussed with pharmacy; [facility].  #Since patient is on peritoneal dialysis; recommend Jakafi 10 mg once a day taking after dialysis.  #Also recommend acyclovir 200 mg twice a day.  We will speak to patient's wife.

## 2019-04-26 NOTE — Assessment & Plan Note (Addendum)
#   Primary Myelofiborisis [Bone marrow 2010] jak-2 positive;  Currently on jakafi 10 mg BID on October 2nd 2019.  Clinically stable.  # July 2020- US spleen- 630 [similar to 580 cc in Feb 2019]; will discuss with pharmacy regarding Jakafi dosing while on peritoneal dialysis.  # Anemia secondary to chronic kidney disease/myelofibrosis-status post IV iron hemoglobin 8.3- defer to Nephrology regarding EPO injections.  Left a message for Dr. Zollie Scale.  # HTN-improved.  Stable.  #CKD stage IV-V [Dr. Latif] S/p Kidney Biopsy- FSG; GFR ~14; on PD; ? Improvement in fatigue.   #Antimicrobial prophylaxis-needs acyclovir will discuss with pharmacy.  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  Family interested in a scooter; will defer to PCP for authorization.  Discussed with wife.  # DISPOSITION:labs copy # follow up in 6 weeks-labs- MD-cbc/bmp/LDH-Dr.B    Cc; Dr.[name] /Dr.Johnson

## 2019-04-27 DIAGNOSIS — Z23 Encounter for immunization: Secondary | ICD-10-CM | POA: Diagnosis not present

## 2019-04-27 DIAGNOSIS — Z992 Dependence on renal dialysis: Secondary | ICD-10-CM | POA: Diagnosis not present

## 2019-04-27 DIAGNOSIS — N186 End stage renal disease: Secondary | ICD-10-CM | POA: Diagnosis not present

## 2019-04-27 DIAGNOSIS — H524 Presbyopia: Secondary | ICD-10-CM | POA: Diagnosis not present

## 2019-04-28 DIAGNOSIS — Z23 Encounter for immunization: Secondary | ICD-10-CM | POA: Diagnosis not present

## 2019-04-28 DIAGNOSIS — N186 End stage renal disease: Secondary | ICD-10-CM | POA: Diagnosis not present

## 2019-04-28 DIAGNOSIS — Z992 Dependence on renal dialysis: Secondary | ICD-10-CM | POA: Diagnosis not present

## 2019-04-29 DIAGNOSIS — Z23 Encounter for immunization: Secondary | ICD-10-CM | POA: Diagnosis not present

## 2019-04-29 DIAGNOSIS — N186 End stage renal disease: Secondary | ICD-10-CM | POA: Diagnosis not present

## 2019-04-29 DIAGNOSIS — Z992 Dependence on renal dialysis: Secondary | ICD-10-CM | POA: Diagnosis not present

## 2019-04-30 DIAGNOSIS — N186 End stage renal disease: Secondary | ICD-10-CM | POA: Diagnosis not present

## 2019-04-30 DIAGNOSIS — Z992 Dependence on renal dialysis: Secondary | ICD-10-CM | POA: Diagnosis not present

## 2019-04-30 DIAGNOSIS — Z23 Encounter for immunization: Secondary | ICD-10-CM | POA: Diagnosis not present

## 2019-05-01 DIAGNOSIS — Z23 Encounter for immunization: Secondary | ICD-10-CM | POA: Diagnosis not present

## 2019-05-01 DIAGNOSIS — Z992 Dependence on renal dialysis: Secondary | ICD-10-CM | POA: Diagnosis not present

## 2019-05-01 DIAGNOSIS — N186 End stage renal disease: Secondary | ICD-10-CM | POA: Diagnosis not present

## 2019-05-02 DIAGNOSIS — N186 End stage renal disease: Secondary | ICD-10-CM | POA: Diagnosis not present

## 2019-05-02 DIAGNOSIS — Z992 Dependence on renal dialysis: Secondary | ICD-10-CM | POA: Diagnosis not present

## 2019-05-02 DIAGNOSIS — Z23 Encounter for immunization: Secondary | ICD-10-CM | POA: Diagnosis not present

## 2019-05-03 DIAGNOSIS — Z992 Dependence on renal dialysis: Secondary | ICD-10-CM | POA: Diagnosis not present

## 2019-05-03 DIAGNOSIS — Z23 Encounter for immunization: Secondary | ICD-10-CM | POA: Diagnosis not present

## 2019-05-03 DIAGNOSIS — N186 End stage renal disease: Secondary | ICD-10-CM | POA: Diagnosis not present

## 2019-05-04 DIAGNOSIS — N186 End stage renal disease: Secondary | ICD-10-CM | POA: Diagnosis not present

## 2019-05-04 DIAGNOSIS — Z23 Encounter for immunization: Secondary | ICD-10-CM | POA: Diagnosis not present

## 2019-05-04 DIAGNOSIS — Z992 Dependence on renal dialysis: Secondary | ICD-10-CM | POA: Diagnosis not present

## 2019-05-05 DIAGNOSIS — Z23 Encounter for immunization: Secondary | ICD-10-CM | POA: Diagnosis not present

## 2019-05-05 DIAGNOSIS — N186 End stage renal disease: Secondary | ICD-10-CM | POA: Diagnosis not present

## 2019-05-05 DIAGNOSIS — Z992 Dependence on renal dialysis: Secondary | ICD-10-CM | POA: Diagnosis not present

## 2019-05-06 DIAGNOSIS — N186 End stage renal disease: Secondary | ICD-10-CM | POA: Diagnosis not present

## 2019-05-06 DIAGNOSIS — Z23 Encounter for immunization: Secondary | ICD-10-CM | POA: Diagnosis not present

## 2019-05-06 DIAGNOSIS — Z992 Dependence on renal dialysis: Secondary | ICD-10-CM | POA: Diagnosis not present

## 2019-05-07 DIAGNOSIS — N186 End stage renal disease: Secondary | ICD-10-CM | POA: Diagnosis not present

## 2019-05-07 DIAGNOSIS — Z23 Encounter for immunization: Secondary | ICD-10-CM | POA: Diagnosis not present

## 2019-05-07 DIAGNOSIS — Z992 Dependence on renal dialysis: Secondary | ICD-10-CM | POA: Diagnosis not present

## 2019-05-08 DIAGNOSIS — N186 End stage renal disease: Secondary | ICD-10-CM | POA: Diagnosis not present

## 2019-05-08 DIAGNOSIS — Z992 Dependence on renal dialysis: Secondary | ICD-10-CM | POA: Diagnosis not present

## 2019-05-08 DIAGNOSIS — Z23 Encounter for immunization: Secondary | ICD-10-CM | POA: Diagnosis not present

## 2019-05-09 DIAGNOSIS — Z992 Dependence on renal dialysis: Secondary | ICD-10-CM | POA: Diagnosis not present

## 2019-05-09 DIAGNOSIS — Z23 Encounter for immunization: Secondary | ICD-10-CM | POA: Diagnosis not present

## 2019-05-09 DIAGNOSIS — N186 End stage renal disease: Secondary | ICD-10-CM | POA: Diagnosis not present

## 2019-05-09 DIAGNOSIS — H2512 Age-related nuclear cataract, left eye: Secondary | ICD-10-CM | POA: Diagnosis not present

## 2019-05-09 DIAGNOSIS — Z961 Presence of intraocular lens: Secondary | ICD-10-CM | POA: Diagnosis not present

## 2019-05-10 DIAGNOSIS — Z992 Dependence on renal dialysis: Secondary | ICD-10-CM | POA: Diagnosis not present

## 2019-05-10 DIAGNOSIS — N186 End stage renal disease: Secondary | ICD-10-CM | POA: Diagnosis not present

## 2019-05-10 DIAGNOSIS — Z23 Encounter for immunization: Secondary | ICD-10-CM | POA: Diagnosis not present

## 2019-05-11 DIAGNOSIS — Z992 Dependence on renal dialysis: Secondary | ICD-10-CM | POA: Diagnosis not present

## 2019-05-11 DIAGNOSIS — Z23 Encounter for immunization: Secondary | ICD-10-CM | POA: Diagnosis not present

## 2019-05-11 DIAGNOSIS — N186 End stage renal disease: Secondary | ICD-10-CM | POA: Diagnosis not present

## 2019-05-12 DIAGNOSIS — Z23 Encounter for immunization: Secondary | ICD-10-CM | POA: Diagnosis not present

## 2019-05-12 DIAGNOSIS — Z992 Dependence on renal dialysis: Secondary | ICD-10-CM | POA: Diagnosis not present

## 2019-05-12 DIAGNOSIS — N186 End stage renal disease: Secondary | ICD-10-CM | POA: Diagnosis not present

## 2019-05-13 DIAGNOSIS — Z23 Encounter for immunization: Secondary | ICD-10-CM | POA: Diagnosis not present

## 2019-05-13 DIAGNOSIS — N186 End stage renal disease: Secondary | ICD-10-CM | POA: Diagnosis not present

## 2019-05-13 DIAGNOSIS — Z992 Dependence on renal dialysis: Secondary | ICD-10-CM | POA: Diagnosis not present

## 2019-05-14 DIAGNOSIS — Z992 Dependence on renal dialysis: Secondary | ICD-10-CM | POA: Diagnosis not present

## 2019-05-14 DIAGNOSIS — N186 End stage renal disease: Secondary | ICD-10-CM | POA: Diagnosis not present

## 2019-05-14 DIAGNOSIS — Z23 Encounter for immunization: Secondary | ICD-10-CM | POA: Diagnosis not present

## 2019-05-15 DIAGNOSIS — Z23 Encounter for immunization: Secondary | ICD-10-CM | POA: Diagnosis not present

## 2019-05-15 DIAGNOSIS — Z992 Dependence on renal dialysis: Secondary | ICD-10-CM | POA: Diagnosis not present

## 2019-05-15 DIAGNOSIS — N186 End stage renal disease: Secondary | ICD-10-CM | POA: Diagnosis not present

## 2019-05-16 DIAGNOSIS — N186 End stage renal disease: Secondary | ICD-10-CM | POA: Diagnosis not present

## 2019-05-16 DIAGNOSIS — Z23 Encounter for immunization: Secondary | ICD-10-CM | POA: Diagnosis not present

## 2019-05-16 DIAGNOSIS — Z992 Dependence on renal dialysis: Secondary | ICD-10-CM | POA: Diagnosis not present

## 2019-05-17 DIAGNOSIS — N186 End stage renal disease: Secondary | ICD-10-CM | POA: Diagnosis not present

## 2019-05-17 DIAGNOSIS — Z992 Dependence on renal dialysis: Secondary | ICD-10-CM | POA: Diagnosis not present

## 2019-05-17 DIAGNOSIS — Z23 Encounter for immunization: Secondary | ICD-10-CM | POA: Diagnosis not present

## 2019-05-18 DIAGNOSIS — N186 End stage renal disease: Secondary | ICD-10-CM | POA: Diagnosis not present

## 2019-05-18 DIAGNOSIS — Z992 Dependence on renal dialysis: Secondary | ICD-10-CM | POA: Diagnosis not present

## 2019-05-18 DIAGNOSIS — Z23 Encounter for immunization: Secondary | ICD-10-CM | POA: Diagnosis not present

## 2019-05-19 ENCOUNTER — Telehealth: Payer: Self-pay | Admitting: Pharmacy Technician

## 2019-05-19 DIAGNOSIS — Z992 Dependence on renal dialysis: Secondary | ICD-10-CM | POA: Diagnosis not present

## 2019-05-19 DIAGNOSIS — Z23 Encounter for immunization: Secondary | ICD-10-CM | POA: Diagnosis not present

## 2019-05-19 DIAGNOSIS — N186 End stage renal disease: Secondary | ICD-10-CM | POA: Diagnosis not present

## 2019-05-19 NOTE — Telephone Encounter (Signed)
Oral Oncology Patient Advocate Encounter   Received fax from [facility] that Mr Knoedler's current grant would be expiring on 06/18/2019.  I was able to renew his grant from the [place] [employer]el[employer] fund in the amount of $8500.  This will provide copayment coverage for [facility] and reduce his out of pocket expense at $0.      The billing information is as follows and has been shared with [place].   Member ID: [id number] Group ID: [id number] RxBin: [id number] Dates of Eligibility: 06/19/2019 through 06/17/2020  [place] Patient [place] Phone [phone] Fax [phone] 05/19/2019 10:12 AM

## 2019-05-20 DIAGNOSIS — Z992 Dependence on renal dialysis: Secondary | ICD-10-CM | POA: Diagnosis not present

## 2019-05-20 DIAGNOSIS — N186 End stage renal disease: Secondary | ICD-10-CM | POA: Diagnosis not present

## 2019-05-20 DIAGNOSIS — Z23 Encounter for immunization: Secondary | ICD-10-CM | POA: Diagnosis not present

## 2019-05-21 DIAGNOSIS — N186 End stage renal disease: Secondary | ICD-10-CM | POA: Diagnosis not present

## 2019-05-21 DIAGNOSIS — Z992 Dependence on renal dialysis: Secondary | ICD-10-CM | POA: Diagnosis not present

## 2019-05-21 DIAGNOSIS — Z23 Encounter for immunization: Secondary | ICD-10-CM | POA: Diagnosis not present

## 2019-05-22 DIAGNOSIS — N186 End stage renal disease: Secondary | ICD-10-CM | POA: Diagnosis not present

## 2019-05-22 DIAGNOSIS — Z23 Encounter for immunization: Secondary | ICD-10-CM | POA: Diagnosis not present

## 2019-05-22 DIAGNOSIS — Z992 Dependence on renal dialysis: Secondary | ICD-10-CM | POA: Diagnosis not present

## 2019-05-23 DIAGNOSIS — N186 End stage renal disease: Secondary | ICD-10-CM | POA: Diagnosis not present

## 2019-05-23 DIAGNOSIS — Z23 Encounter for immunization: Secondary | ICD-10-CM | POA: Diagnosis not present

## 2019-05-23 DIAGNOSIS — Z992 Dependence on renal dialysis: Secondary | ICD-10-CM | POA: Diagnosis not present

## 2019-05-24 DIAGNOSIS — N186 End stage renal disease: Secondary | ICD-10-CM | POA: Diagnosis not present

## 2019-05-24 DIAGNOSIS — Z23 Encounter for immunization: Secondary | ICD-10-CM | POA: Diagnosis not present

## 2019-05-24 DIAGNOSIS — Z992 Dependence on renal dialysis: Secondary | ICD-10-CM | POA: Diagnosis not present

## 2019-05-25 DIAGNOSIS — N186 End stage renal disease: Secondary | ICD-10-CM | POA: Diagnosis not present

## 2019-05-25 DIAGNOSIS — Z992 Dependence on renal dialysis: Secondary | ICD-10-CM | POA: Diagnosis not present

## 2019-05-25 DIAGNOSIS — Z23 Encounter for immunization: Secondary | ICD-10-CM | POA: Diagnosis not present

## 2019-05-26 DIAGNOSIS — N186 End stage renal disease: Secondary | ICD-10-CM | POA: Diagnosis not present

## 2019-05-26 DIAGNOSIS — Z23 Encounter for immunization: Secondary | ICD-10-CM | POA: Diagnosis not present

## 2019-05-26 DIAGNOSIS — Z992 Dependence on renal dialysis: Secondary | ICD-10-CM | POA: Diagnosis not present

## 2019-05-27 DIAGNOSIS — Z992 Dependence on renal dialysis: Secondary | ICD-10-CM | POA: Diagnosis not present

## 2019-05-27 DIAGNOSIS — Z23 Encounter for immunization: Secondary | ICD-10-CM | POA: Diagnosis not present

## 2019-05-27 DIAGNOSIS — N186 End stage renal disease: Secondary | ICD-10-CM | POA: Diagnosis not present

## 2019-05-28 DIAGNOSIS — Z992 Dependence on renal dialysis: Secondary | ICD-10-CM | POA: Diagnosis not present

## 2019-05-28 DIAGNOSIS — Z23 Encounter for immunization: Secondary | ICD-10-CM | POA: Diagnosis not present

## 2019-05-28 DIAGNOSIS — N186 End stage renal disease: Secondary | ICD-10-CM | POA: Diagnosis not present

## 2019-05-29 DIAGNOSIS — Z01818 Encounter for other preprocedural examination: Secondary | ICD-10-CM | POA: Diagnosis not present

## 2019-05-29 DIAGNOSIS — Z20828 Contact with and (suspected) exposure to other viral communicable diseases: Secondary | ICD-10-CM | POA: Diagnosis not present

## 2019-05-29 DIAGNOSIS — H2512 Age-related nuclear cataract, left eye: Secondary | ICD-10-CM | POA: Diagnosis not present

## 2019-05-29 DIAGNOSIS — Z992 Dependence on renal dialysis: Secondary | ICD-10-CM | POA: Diagnosis not present

## 2019-05-29 DIAGNOSIS — N186 End stage renal disease: Secondary | ICD-10-CM | POA: Diagnosis not present

## 2019-05-29 DIAGNOSIS — Z23 Encounter for immunization: Secondary | ICD-10-CM | POA: Diagnosis not present

## 2019-05-30 DIAGNOSIS — N186 End stage renal disease: Secondary | ICD-10-CM | POA: Diagnosis not present

## 2019-05-30 DIAGNOSIS — Z992 Dependence on renal dialysis: Secondary | ICD-10-CM | POA: Diagnosis not present

## 2019-05-30 DIAGNOSIS — Z23 Encounter for immunization: Secondary | ICD-10-CM | POA: Diagnosis not present

## 2019-05-31 DIAGNOSIS — Z992 Dependence on renal dialysis: Secondary | ICD-10-CM | POA: Diagnosis not present

## 2019-05-31 DIAGNOSIS — N186 End stage renal disease: Secondary | ICD-10-CM | POA: Diagnosis not present

## 2019-05-31 DIAGNOSIS — Z23 Encounter for immunization: Secondary | ICD-10-CM | POA: Diagnosis not present

## 2019-06-01 DIAGNOSIS — N186 End stage renal disease: Secondary | ICD-10-CM | POA: Diagnosis not present

## 2019-06-01 DIAGNOSIS — Z992 Dependence on renal dialysis: Secondary | ICD-10-CM | POA: Diagnosis not present

## 2019-06-01 DIAGNOSIS — H25812 Combined forms of age-related cataract, left eye: Secondary | ICD-10-CM | POA: Diagnosis not present

## 2019-06-01 DIAGNOSIS — Z23 Encounter for immunization: Secondary | ICD-10-CM | POA: Diagnosis not present

## 2019-06-01 DIAGNOSIS — H2512 Age-related nuclear cataract, left eye: Secondary | ICD-10-CM | POA: Diagnosis not present

## 2019-06-02 DIAGNOSIS — Z992 Dependence on renal dialysis: Secondary | ICD-10-CM | POA: Diagnosis not present

## 2019-06-02 DIAGNOSIS — N186 End stage renal disease: Secondary | ICD-10-CM | POA: Diagnosis not present

## 2019-06-02 DIAGNOSIS — Z23 Encounter for immunization: Secondary | ICD-10-CM | POA: Diagnosis not present

## 2019-06-03 DIAGNOSIS — N186 End stage renal disease: Secondary | ICD-10-CM | POA: Diagnosis not present

## 2019-06-03 DIAGNOSIS — Z992 Dependence on renal dialysis: Secondary | ICD-10-CM | POA: Diagnosis not present

## 2019-06-03 DIAGNOSIS — Z23 Encounter for immunization: Secondary | ICD-10-CM | POA: Diagnosis not present

## 2019-06-04 DIAGNOSIS — Z23 Encounter for immunization: Secondary | ICD-10-CM | POA: Diagnosis not present

## 2019-06-04 DIAGNOSIS — N186 End stage renal disease: Secondary | ICD-10-CM | POA: Diagnosis not present

## 2019-06-04 DIAGNOSIS — Z992 Dependence on renal dialysis: Secondary | ICD-10-CM | POA: Diagnosis not present

## 2019-06-05 DIAGNOSIS — Z23 Encounter for immunization: Secondary | ICD-10-CM | POA: Diagnosis not present

## 2019-06-05 DIAGNOSIS — N186 End stage renal disease: Secondary | ICD-10-CM | POA: Diagnosis not present

## 2019-06-05 DIAGNOSIS — Z992 Dependence on renal dialysis: Secondary | ICD-10-CM | POA: Diagnosis not present

## 2019-06-06 DIAGNOSIS — Z992 Dependence on renal dialysis: Secondary | ICD-10-CM | POA: Diagnosis not present

## 2019-06-06 DIAGNOSIS — Z23 Encounter for immunization: Secondary | ICD-10-CM | POA: Diagnosis not present

## 2019-06-06 DIAGNOSIS — N186 End stage renal disease: Secondary | ICD-10-CM | POA: Diagnosis not present

## 2019-06-07 ENCOUNTER — Encounter: Payer: Self-pay | Admitting: Internal Medicine

## 2019-06-07 ENCOUNTER — Inpatient Hospital Stay: Payer: Medicare HMO | Attending: Internal Medicine

## 2019-06-07 ENCOUNTER — Other Ambulatory Visit: Payer: Self-pay

## 2019-06-07 ENCOUNTER — Inpatient Hospital Stay (HOSPITAL_BASED_OUTPATIENT_CLINIC_OR_DEPARTMENT_OTHER): Payer: Medicare HMO | Admitting: Internal Medicine

## 2019-06-07 DIAGNOSIS — F17210 Nicotine dependence, cigarettes, uncomplicated: Secondary | ICD-10-CM | POA: Insufficient documentation

## 2019-06-07 DIAGNOSIS — D471 Chronic myeloproliferative disease: Secondary | ICD-10-CM | POA: Diagnosis not present

## 2019-06-07 DIAGNOSIS — Z79899 Other long term (current) drug therapy: Secondary | ICD-10-CM | POA: Diagnosis not present

## 2019-06-07 DIAGNOSIS — I120 Hypertensive chronic kidney disease with stage 5 chronic kidney disease or end stage renal disease: Secondary | ICD-10-CM | POA: Diagnosis not present

## 2019-06-07 DIAGNOSIS — Z801 Family history of malignant neoplasm of trachea, bronchus and lung: Secondary | ICD-10-CM | POA: Diagnosis not present

## 2019-06-07 DIAGNOSIS — Z803 Family history of malignant neoplasm of breast: Secondary | ICD-10-CM | POA: Diagnosis not present

## 2019-06-07 DIAGNOSIS — N186 End stage renal disease: Secondary | ICD-10-CM | POA: Diagnosis not present

## 2019-06-07 DIAGNOSIS — Z23 Encounter for immunization: Secondary | ICD-10-CM | POA: Diagnosis not present

## 2019-06-07 DIAGNOSIS — Z800 Family history of malignant neoplasm of digestive organs: Secondary | ICD-10-CM | POA: Insufficient documentation

## 2019-06-07 DIAGNOSIS — Z7982 Long term (current) use of aspirin: Secondary | ICD-10-CM | POA: Diagnosis not present

## 2019-06-07 DIAGNOSIS — D631 Anemia in chronic kidney disease: Secondary | ICD-10-CM | POA: Insufficient documentation

## 2019-06-07 DIAGNOSIS — Z992 Dependence on renal dialysis: Secondary | ICD-10-CM | POA: Insufficient documentation

## 2019-06-07 DIAGNOSIS — E785 Hyperlipidemia, unspecified: Secondary | ICD-10-CM | POA: Insufficient documentation

## 2019-06-07 DIAGNOSIS — N185 Chronic kidney disease, stage 5: Secondary | ICD-10-CM | POA: Insufficient documentation

## 2019-06-07 DIAGNOSIS — Z8041 Family history of malignant neoplasm of ovary: Secondary | ICD-10-CM | POA: Diagnosis not present

## 2019-06-07 LAB — CBC WITH DIFFERENTIAL/PLATELET
Abs Immature Granulocytes: 0.05 10*3/uL (ref 0.00–0.07)
Basophils Absolute: 0 10*3/uL (ref 0.0–0.1)
Basophils Relative: 0 %
Eosinophils Absolute: 0 10*3/uL (ref 0.0–0.5)
Eosinophils Relative: 0 %
HCT: 31.7 % — ABNORMAL LOW (ref 39.0–52.0)
Hemoglobin: 10.2 g/dL — ABNORMAL LOW (ref 13.0–17.0)
Immature Granulocytes: 1 %
Lymphocytes Relative: 12 %
Lymphs Abs: 0.6 10*3/uL — ABNORMAL LOW (ref 0.7–4.0)
MCH: 30.4 pg (ref 26.0–34.0)
MCHC: 32.2 g/dL (ref 30.0–36.0)
MCV: 94.3 fL (ref 80.0–100.0)
Monocytes Absolute: 0.2 10*3/uL (ref 0.1–1.0)
Monocytes Relative: 4 %
Neutro Abs: 4.3 10*3/uL (ref 1.7–7.7)
Neutrophils Relative %: 83 %
Platelets: 98 10*3/uL — ABNORMAL LOW (ref 150–400)
RBC: 3.36 MIL/uL — ABNORMAL LOW (ref 4.22–5.81)
RDW: 17.1 % — ABNORMAL HIGH (ref 11.5–15.5)
WBC: 5.2 10*3/uL (ref 4.0–10.5)
nRBC: 1.3 % — ABNORMAL HIGH (ref 0.0–0.2)

## 2019-06-07 LAB — BASIC METABOLIC PANEL
Anion gap: 9 (ref 5–15)
BUN: 50 mg/dL — ABNORMAL HIGH (ref 8–23)
CO2: 25 mmol/L (ref 22–32)
Calcium: 8.8 mg/dL — ABNORMAL LOW (ref 8.9–10.3)
Chloride: 104 mmol/L (ref 98–111)
Creatinine, Ser: 4.02 mg/dL — ABNORMAL HIGH (ref 0.61–1.24)
GFR calc Af Amer: 17 mL/min — ABNORMAL LOW (ref >60)
GFR calc non Af Amer: 15 mL/min — ABNORMAL LOW (ref >60)
Glucose, Bld: 108 mg/dL — ABNORMAL HIGH (ref 70–99)
Potassium: 4.5 mmol/L (ref 3.5–5.1)
Sodium: 138 mmol/L (ref 135–145)

## 2019-06-07 LAB — LACTATE DEHYDROGENASE: LDH: 552 U/L — ABNORMAL HIGH (ref 98–192)

## 2019-06-07 NOTE — Progress Notes (Signed)
[place] OFFICE PROGRESS NOTE  Patient Care Team: Valerie Roys, DO as PCP - General (Family Medicine) End, Harrell Gave, MD as PCP - Cardiology (Cardiology) Cammie Sickle, MD as Consulting Physician (Internal Medicine)  Cancer Staging No matching staging information was found for the patient.   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   # AUGUST 1st week 2019- Retacrit   # October 2nd- jakafi 10 BID [? 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 [name]- kidney Bx- FSG [ on Prednisone Dr.[name]]; July 2020-peritoneal dialysis;   # hx of Lung nodules- resolved [[name]]/quit smoking.  DIAGNOSIS: PRIMARY MYELOFIBROSIS  RISK:LOW     ;GOALS: Control  CURRENT/MOST RECENT THERAPY : Jakafi     Primary myelofibrosis ([place])      INTERVAL HISTORY:  James Arnold 63 y.o.  male pleasant patient above history of primary myelofibrosis Jak 2+ currently on Jakafi 10 milligrams once a day [name] dosed/peritoneal dialysis] and also chronic kidney disease/FSG on prednisone is here for follow-up.   Patient states his fatigue is improved on peritoneal dialysis.  Patient is getting erythropoietin injections through nephrology.  Appetite is fair.  Denies any nausea vomiting.  No fevers chills.  No nausea no vomiting.  Appetite is fair.  No weight loss.  Review of Systems  Constitutional: Positive for malaise/fatigue. Negative for chills, diaphoresis, fever and weight loss.  HENT: Negative for nosebleeds and sore throat.   Eyes: Negative for double vision.  Respiratory: Positive for  shortness of breath. Negative for cough, hemoptysis, sputum production and wheezing.   Cardiovascular: Negative for chest pain, palpitations and orthopnea.  Gastrointestinal: Negative for abdominal pain, blood in stool, constipation, diarrhea, heartburn, melena, nausea and vomiting.  Genitourinary: Negative for dysuria, frequency and urgency.  Musculoskeletal: Negative for back pain and joint pain.  Skin: Negative.  Negative for itching and rash.  Neurological: Negative for dizziness, tingling, focal weakness, weakness and headaches.  Endo/Heme/Allergies: Does not bruise/bleed easily.  Psychiatric/Behavioral: Negative for depression. The patient is not nervous/anxious and does not have insomnia.       PAST MEDICAL HISTORY :  Past Medical History:  Diagnosis Date  . Acute renal failure (ARF) ([place]) 05/15/2017  . Anemia   . Benign hypertensive renal disease   . CAD (coronary artery disease)    a. 08/1996 s/p BMS to the mLAD ([facility]); b. 12/2017 MV: EF 46%, fixed apical defect w/ significant GI uptake artifact. No ischemia-> low risk.  . CKD (chronic kidney disease), stage IV ([place])   . Depression   . Diastolic dysfunction    a. 12/2017 Echo: EF 60-65%, no rwma, Gr1 DD, mild AI/MR. Nl RVSP.  [name] Kitchen Dyspnea    with exertion  . FSGS (focal segmental glomerulosclerosis)   . Hyperlipidemia 10/2002  . Hypertension   . Myelofibrosis ([place]) 2010  . Smoker   . Thrombocytopenia ([place])     PAST SURGICAL HISTORY :   Past Surgical History:  Procedure Laterality Date  . ANGIOPLASTY    . BONE MARROW ASPIRATION  03/2009  . CARDIAC CATHETERIZATION    . COLONOSCOPY WITH PROPOFOL N/A 08/09/2018   Procedure: COLONOSCOPY WITH PROPOFOL;  Surgeon: [name], MD;  Location: [facility] ENDOSCOPY;  Service: Gastroenterology;  Laterality: N/A;  . CORONARY ARTERY BYPASS GRAFT    . CORONARY STENT PLACEMENT  08/1996    FAMILY HISTORY :   Family History  Problem Relation Age of Onset  . Stroke Mother        Low BP  stroke  . Depression Father   . Depression Sister        Breast  . Heart disease Other   . Breast cancer Other   . Lung cancer Other   . Ovarian cancer Other   . Stomach cancer Other     SOCIAL HISTORY:   Social History   Tobacco Use  . Smoking status: Current Every Day Smoker    Packs/day: 1.00    Types: Cigarettes    Last attempt to quit: 01/11/2018    Years since quitting: 1.4  . Smokeless tobacco: Never Used  Substance Use Topics  . Alcohol use: No    Alcohol/week: 0.0 standard drinks    Frequency: Never    Comment: patient states he has not consumed alcohol in the last month  . Drug use: No    ALLERGIES:  has No Known Allergies.  MEDICATIONS:  Current Outpatient Medications  Medication Sig Dispense Refill  . acyclovir (ZOVIRAX) 200 MG capsule Take 1 capsule (200 mg total) by mouth 2 (two) times daily. 60 capsule 6  . allopurinol (ZYLOPRIM) 100 MG tablet Take 0.5 tablets (50 mg total) by mouth daily. 45 tablet 3  . amLODipine (NORVASC) 10 MG tablet Take 1 tablet (10 mg total) by mouth daily. 90 tablet 1  . aspirin EC 81 MG tablet Take by mouth.    . calcitRIOL (ROCALTROL) 0.25 MCG capsule Take 1 capsule by mouth daily.    . carvedilol (COREG) 12.5 MG tablet Take 1 tablet (12.5 mg total) by mouth 2 (two) times daily. 180 tablet 1  . Cholecalciferol (VITAMIN D-1000 MAX ST) 25 MCG (1000 UT) tablet Take by mouth.    . citalopram (CELEXA) 20 MG tablet TAKE 1 TABLET(20 MG) BY MOUTH DAILY 90 tablet 1  . epoetin alfa-epbx (RETACRIT) 62263 UNIT/ML injection Inject into the skin.    . ferrous sulfate 325 (65 FE) MG tablet Take 1 tablet (325 mg total) by mouth daily with breakfast. 30 tablet 1  . furosemide (LASIX) 20 MG tablet Take 1 tablet (20 mg total) by mouth daily. 90 tablet 1  . predniSONE (DELTASONE) 10 MG tablet Take 10 mg by mouth daily with breakfast.    . ruxolitinib phosphate (JAKAFI) 10 MG tablet Take 1 tablet (10 mg total) by mouth daily. 60 tablet 4  . traMADol  (ULTRAM) 50 MG tablet Take 1 tablet (50 mg total) by mouth every 8 (eight) hours as needed. 21 tablet 0   No current facility-administered medications for this visit.     PHYSICAL EXAMINATION: ECOG PERFORMANCE STATUS: 1 - Symptomatic but completely ambulatory  BP 139/78 (BP Location: Left Arm, Patient Position: Sitting, Cuff Size: Normal)   Pulse 63   Temp 97.9 F (36.6 C) (Tympanic)   Resp 20   Ht '5\' 7"'  (1.702 m)   Wt 180 lb 14.4 oz (82.1 kg)   BMI 28.33 kg/m   Filed Weights   06/07/19 0952  Weight: 180 lb 14.4 oz (82.1 kg)    Physical Exam  Constitutional: He is oriented to person, place, and time and well-developed, well-nourished, and in no distress.  He is alone.   HENT:  Head: Normocephalic and atraumatic.  Mouth/Throat: Oropharynx is clear and moist. No oropharyngeal exudate.  Eyes: Pupils are equal, round, and reactive to light.  Neck: Normal range of motion. Neck supple.  Cardiovascular: Normal rate and regular rhythm.  Pulmonary/Chest: No respiratory distress. He has no wheezes.  Decreased air entry bilaterally.  Abdominal: Soft. Bowel sounds are normal. He exhibits no distension and no mass. There is no abdominal tenderness. There is no rebound and no guarding.  Musculoskeletal: Normal range of motion.        General: No tenderness or edema.  Neurological: He is alert and oriented to person, place, and time.  Skin: Skin is warm.  Psychiatric: Affect normal.       LABORATORY DATA:  I have reviewed the data as listed    Component Value Date/Time   NA 138 06/07/2019 0922   NA 137 03/14/2019 1000   K 4.5 06/07/2019 0922   CL 104 06/07/2019 0922   CO2 25 06/07/2019 0922   GLUCOSE 108 (H) 06/07/2019 0922   BUN 50 (H) 06/07/2019 0922   BUN 58 (H) 03/14/2019 1000   CREATININE 4.02 (H) 06/07/2019 0922   CREATININE 1.12 09/30/2013 0953   CALCIUM 8.8 (L) 06/07/2019 0922   PROT 6.1 03/14/2019 1000   ALBUMIN 4.6 03/14/2019 1000   AST 12 03/14/2019 1000    ALT 15 03/14/2019 1000   ALKPHOS 39 03/14/2019 1000   BILITOT 0.4 03/14/2019 1000   GFRNONAA 15 (L) 06/07/2019 0922   GFRNONAA >60 09/30/2013 0953   GFRAA 17 (L) 06/07/2019 0922   GFRAA >60 09/30/2013 0953    No results found for: SPEP, UPEP  Lab Results  Component Value Date   WBC 5.2 06/07/2019   NEUTROABS 4.3 06/07/2019   HGB 10.2 (L) 06/07/2019   HCT 31.7 (L) 06/07/2019   MCV 94.3 06/07/2019   PLT 98 (L) 06/07/2019      Chemistry      Component Value Date/Time   NA 138 06/07/2019 0922   NA 137 03/14/2019 1000   K 4.5 06/07/2019 0922   CL 104 06/07/2019 0922   CO2 25 06/07/2019 0922   BUN 50 (H) 06/07/2019 0922   BUN 58 (H) 03/14/2019 1000   CREATININE 4.02 (H) 06/07/2019 0922   CREATININE 1.12 09/30/2013 0953      Component Value Date/Time   CALCIUM 8.8 (L) 06/07/2019 0922   ALKPHOS 39 03/14/2019 1000   AST 12 03/14/2019 1000   ALT 15 03/14/2019 1000   BILITOT 0.4 03/14/2019 1000       RADIOGRAPHIC STUDIES: I have personally reviewed the radiological images as listed and agreed with the findings in the report. No results found.   ASSESSMENT & PLAN:  Primary myelofibrosis ([place]) # Primary Myelofiborisis [Bone marrow 2010] jak-2 positive;  Currently on jakafi since October 2nd 2019. STABLE.   # July 2020- US spleen- 630 [similar to 580 cc in Feb 2019];Jakafi 10 mg once a day in AM while on  PD.  Will order ultrasound at next visit.  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  hemoglobin 10.3; improved.    #CKD stage IV-V [Dr. Latif]  on PD; slight improvement fatigue/stable.  #Antimicrobial prophylaxis-acyclovir 200 mg twice a day.  # DISPOSITION:labs copy # follow up in 6 weeks-labs- MD-cbc/bmp/LDH-Dr.B    Cc; Dr.[name] /Dr.Johnson   Orders Placed This Encounter  Procedures  . CBC with Differential    Standing Status:   Future    Standing Expiration Date:  06/06/2020  . Lactate dehydrogenase    Standing Status:   Future    Standing Expiration Date:   06/06/2020  . Basic metabolic panel    Standing Status:   Future    Standing Expiration Date:   06/06/2020   All questions were answered. The patient knows to call the clinic with any problems, questions or concerns.     Cammie Sickle, MD 06/07/2019 10:08 AM

## 2019-06-07 NOTE — Assessment & Plan Note (Addendum)
#   Primary Myelofiborisis [Bone marrow 2010] jak-2 positive;  Currently on jakafi since October 2nd 2019. STABLE.   # July 2020- US spleen- 630 [similar to 580 cc in Feb 2019];Jakafi 10 mg once a day in AM while on  PD.  Will order ultrasound at next visit.  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  hemoglobin 10.3; improved.    #CKD stage IV-V [Dr. Latif]  on PD; slight improvement fatigue/stable.  #Antimicrobial prophylaxis-acyclovir 200 mg twice a day.  # DISPOSITION:labs copy # follow up in 6 weeks-labs- MD-cbc/bmp/LDH-Dr.B    Cc; Dr.[name] /Dr.Johnson

## 2019-06-08 DIAGNOSIS — N186 End stage renal disease: Secondary | ICD-10-CM | POA: Diagnosis not present

## 2019-06-08 DIAGNOSIS — Z992 Dependence on renal dialysis: Secondary | ICD-10-CM | POA: Diagnosis not present

## 2019-06-08 DIAGNOSIS — Z23 Encounter for immunization: Secondary | ICD-10-CM | POA: Diagnosis not present

## 2019-06-09 DIAGNOSIS — N186 End stage renal disease: Secondary | ICD-10-CM | POA: Diagnosis not present

## 2019-06-09 DIAGNOSIS — Z992 Dependence on renal dialysis: Secondary | ICD-10-CM | POA: Diagnosis not present

## 2019-06-09 DIAGNOSIS — Z23 Encounter for immunization: Secondary | ICD-10-CM | POA: Diagnosis not present

## 2019-06-10 ENCOUNTER — Telehealth: Payer: Self-pay | Admitting: *Deleted

## 2019-06-10 DIAGNOSIS — N186 End stage renal disease: Secondary | ICD-10-CM | POA: Diagnosis not present

## 2019-06-10 DIAGNOSIS — Z992 Dependence on renal dialysis: Secondary | ICD-10-CM | POA: Diagnosis not present

## 2019-06-10 DIAGNOSIS — Z23 Encounter for immunization: Secondary | ICD-10-CM | POA: Diagnosis not present

## 2019-06-10 NOTE — Telephone Encounter (Signed)
patient is requesting a refill on his Tramadol.  His shoulder is hurting.  I told his wife all I could do is send a message.  Phone number is [phone] if there are any issues.  He uses [facility].  per Dr. Rogue Bussing -  please speak to is wife and tell her thta i would recommend that they reach out to his PCP for follow up of shoulder pain/tramadol refills.  I contacted the patient's wife. She gave verbal understanding that Dr. B would not RF the Tramadol. Pt should contact pcp re: shoulder pain and tramadol RF

## 2019-06-11 DIAGNOSIS — Z23 Encounter for immunization: Secondary | ICD-10-CM | POA: Diagnosis not present

## 2019-06-11 DIAGNOSIS — N186 End stage renal disease: Secondary | ICD-10-CM | POA: Diagnosis not present

## 2019-06-11 DIAGNOSIS — Z992 Dependence on renal dialysis: Secondary | ICD-10-CM | POA: Diagnosis not present

## 2019-06-12 DIAGNOSIS — Z23 Encounter for immunization: Secondary | ICD-10-CM | POA: Diagnosis not present

## 2019-06-12 DIAGNOSIS — N186 End stage renal disease: Secondary | ICD-10-CM | POA: Diagnosis not present

## 2019-06-12 DIAGNOSIS — Z992 Dependence on renal dialysis: Secondary | ICD-10-CM | POA: Diagnosis not present

## 2019-06-13 DIAGNOSIS — Z23 Encounter for immunization: Secondary | ICD-10-CM | POA: Diagnosis not present

## 2019-06-13 DIAGNOSIS — N186 End stage renal disease: Secondary | ICD-10-CM | POA: Diagnosis not present

## 2019-06-13 DIAGNOSIS — Z992 Dependence on renal dialysis: Secondary | ICD-10-CM | POA: Diagnosis not present

## 2019-06-14 DIAGNOSIS — Z23 Encounter for immunization: Secondary | ICD-10-CM | POA: Diagnosis not present

## 2019-06-14 DIAGNOSIS — N186 End stage renal disease: Secondary | ICD-10-CM | POA: Diagnosis not present

## 2019-06-14 DIAGNOSIS — Z992 Dependence on renal dialysis: Secondary | ICD-10-CM | POA: Diagnosis not present

## 2019-06-15 DIAGNOSIS — Z23 Encounter for immunization: Secondary | ICD-10-CM | POA: Diagnosis not present

## 2019-06-15 DIAGNOSIS — N186 End stage renal disease: Secondary | ICD-10-CM | POA: Diagnosis not present

## 2019-06-15 DIAGNOSIS — Z992 Dependence on renal dialysis: Secondary | ICD-10-CM | POA: Diagnosis not present

## 2019-06-16 DIAGNOSIS — Z23 Encounter for immunization: Secondary | ICD-10-CM | POA: Diagnosis not present

## 2019-06-16 DIAGNOSIS — N186 End stage renal disease: Secondary | ICD-10-CM | POA: Diagnosis not present

## 2019-06-16 DIAGNOSIS — Z992 Dependence on renal dialysis: Secondary | ICD-10-CM | POA: Diagnosis not present

## 2019-06-17 DIAGNOSIS — Z992 Dependence on renal dialysis: Secondary | ICD-10-CM | POA: Diagnosis not present

## 2019-06-17 DIAGNOSIS — N186 End stage renal disease: Secondary | ICD-10-CM | POA: Diagnosis not present

## 2019-06-17 DIAGNOSIS — Z23 Encounter for immunization: Secondary | ICD-10-CM | POA: Diagnosis not present

## 2019-06-18 DIAGNOSIS — Z992 Dependence on renal dialysis: Secondary | ICD-10-CM | POA: Diagnosis not present

## 2019-06-18 DIAGNOSIS — N186 End stage renal disease: Secondary | ICD-10-CM | POA: Diagnosis not present

## 2019-06-18 DIAGNOSIS — Z23 Encounter for immunization: Secondary | ICD-10-CM | POA: Diagnosis not present

## 2019-06-19 DIAGNOSIS — Z992 Dependence on renal dialysis: Secondary | ICD-10-CM | POA: Diagnosis not present

## 2019-06-19 DIAGNOSIS — N186 End stage renal disease: Secondary | ICD-10-CM | POA: Diagnosis not present

## 2019-06-19 DIAGNOSIS — Z23 Encounter for immunization: Secondary | ICD-10-CM | POA: Diagnosis not present

## 2019-06-20 DIAGNOSIS — Z23 Encounter for immunization: Secondary | ICD-10-CM | POA: Diagnosis not present

## 2019-06-20 DIAGNOSIS — Z992 Dependence on renal dialysis: Secondary | ICD-10-CM | POA: Diagnosis not present

## 2019-06-20 DIAGNOSIS — N186 End stage renal disease: Secondary | ICD-10-CM | POA: Diagnosis not present

## 2019-06-21 ENCOUNTER — Other Ambulatory Visit: Payer: Self-pay | Admitting: Nephrology

## 2019-06-21 DIAGNOSIS — N186 End stage renal disease: Secondary | ICD-10-CM

## 2019-06-21 DIAGNOSIS — Z992 Dependence on renal dialysis: Secondary | ICD-10-CM | POA: Diagnosis not present

## 2019-06-21 DIAGNOSIS — Z23 Encounter for immunization: Secondary | ICD-10-CM | POA: Diagnosis not present

## 2019-06-22 DIAGNOSIS — N186 End stage renal disease: Secondary | ICD-10-CM | POA: Diagnosis not present

## 2019-06-22 DIAGNOSIS — Z992 Dependence on renal dialysis: Secondary | ICD-10-CM | POA: Diagnosis not present

## 2019-06-22 DIAGNOSIS — Z23 Encounter for immunization: Secondary | ICD-10-CM | POA: Diagnosis not present

## 2019-06-23 ENCOUNTER — Ambulatory Visit
Admission: RE | Admit: 2019-06-23 | Discharge: 2019-06-23 | Disposition: A | Discharge status: Home / Self Care | Payer: Medicare HMO | Source: Ambulatory Visit | Attending: Nephrology | Admitting: Nephrology

## 2019-06-23 ENCOUNTER — Other Ambulatory Visit: Payer: Self-pay | Admitting: Nephrology

## 2019-06-23 DIAGNOSIS — T85691A Other mechanical complication of intraperitoneal dialysis catheter, initial encounter: Secondary | ICD-10-CM | POA: Diagnosis not present

## 2019-06-23 DIAGNOSIS — Z992 Dependence on renal dialysis: Secondary | ICD-10-CM | POA: Diagnosis not present

## 2019-06-23 DIAGNOSIS — N186 End stage renal disease: Secondary | ICD-10-CM | POA: Diagnosis not present

## 2019-06-23 DIAGNOSIS — T85611A Breakdown (mechanical) of intraperitoneal dialysis catheter, initial encounter: Secondary | ICD-10-CM

## 2019-06-23 DIAGNOSIS — Z23 Encounter for immunization: Secondary | ICD-10-CM | POA: Diagnosis not present

## 2019-06-24 DIAGNOSIS — Z992 Dependence on renal dialysis: Secondary | ICD-10-CM | POA: Diagnosis not present

## 2019-06-24 DIAGNOSIS — N186 End stage renal disease: Secondary | ICD-10-CM | POA: Diagnosis not present

## 2019-06-24 DIAGNOSIS — Z23 Encounter for immunization: Secondary | ICD-10-CM | POA: Diagnosis not present

## 2019-06-26 DIAGNOSIS — N186 End stage renal disease: Secondary | ICD-10-CM | POA: Diagnosis not present

## 2019-06-26 DIAGNOSIS — Z23 Encounter for immunization: Secondary | ICD-10-CM | POA: Diagnosis not present

## 2019-06-26 DIAGNOSIS — Z992 Dependence on renal dialysis: Secondary | ICD-10-CM | POA: Diagnosis not present

## 2019-06-27 DIAGNOSIS — N186 End stage renal disease: Secondary | ICD-10-CM | POA: Diagnosis not present

## 2019-06-27 DIAGNOSIS — Z992 Dependence on renal dialysis: Secondary | ICD-10-CM | POA: Diagnosis not present

## 2019-06-27 DIAGNOSIS — Z23 Encounter for immunization: Secondary | ICD-10-CM | POA: Diagnosis not present

## 2019-06-28 DIAGNOSIS — Z992 Dependence on renal dialysis: Secondary | ICD-10-CM | POA: Diagnosis not present

## 2019-06-28 DIAGNOSIS — Z23 Encounter for immunization: Secondary | ICD-10-CM | POA: Diagnosis not present

## 2019-06-28 DIAGNOSIS — N186 End stage renal disease: Secondary | ICD-10-CM | POA: Diagnosis not present

## 2019-06-29 DIAGNOSIS — Z992 Dependence on renal dialysis: Secondary | ICD-10-CM | POA: Diagnosis not present

## 2019-06-29 DIAGNOSIS — N186 End stage renal disease: Secondary | ICD-10-CM | POA: Diagnosis not present

## 2019-06-29 DIAGNOSIS — Z23 Encounter for immunization: Secondary | ICD-10-CM | POA: Diagnosis not present

## 2019-06-30 DIAGNOSIS — Z992 Dependence on renal dialysis: Secondary | ICD-10-CM | POA: Diagnosis not present

## 2019-06-30 DIAGNOSIS — Z23 Encounter for immunization: Secondary | ICD-10-CM | POA: Diagnosis not present

## 2019-06-30 DIAGNOSIS — N186 End stage renal disease: Secondary | ICD-10-CM | POA: Diagnosis not present

## 2019-07-01 DIAGNOSIS — N186 End stage renal disease: Secondary | ICD-10-CM | POA: Diagnosis not present

## 2019-07-01 DIAGNOSIS — Z992 Dependence on renal dialysis: Secondary | ICD-10-CM | POA: Diagnosis not present

## 2019-07-01 DIAGNOSIS — Z23 Encounter for immunization: Secondary | ICD-10-CM | POA: Diagnosis not present

## 2019-07-01 MED FILL — JAKAFI 10 MG TABLET: 10 | 30 days supply | Qty: 30 | Fill #0

## 2019-07-03 DIAGNOSIS — Z992 Dependence on renal dialysis: Secondary | ICD-10-CM | POA: Diagnosis not present

## 2019-07-03 DIAGNOSIS — Z23 Encounter for immunization: Secondary | ICD-10-CM | POA: Diagnosis not present

## 2019-07-03 DIAGNOSIS — N186 End stage renal disease: Secondary | ICD-10-CM | POA: Diagnosis not present

## 2019-07-04 DIAGNOSIS — N186 End stage renal disease: Secondary | ICD-10-CM | POA: Diagnosis not present

## 2019-07-04 DIAGNOSIS — Z23 Encounter for immunization: Secondary | ICD-10-CM | POA: Diagnosis not present

## 2019-07-04 DIAGNOSIS — Z992 Dependence on renal dialysis: Secondary | ICD-10-CM | POA: Diagnosis not present

## 2019-07-05 DIAGNOSIS — Z992 Dependence on renal dialysis: Secondary | ICD-10-CM | POA: Diagnosis not present

## 2019-07-05 DIAGNOSIS — N186 End stage renal disease: Secondary | ICD-10-CM | POA: Diagnosis not present

## 2019-07-05 DIAGNOSIS — Z23 Encounter for immunization: Secondary | ICD-10-CM | POA: Diagnosis not present

## 2019-07-06 ENCOUNTER — Ambulatory Visit: Payer: Medicare HMO

## 2019-07-06 DIAGNOSIS — Z992 Dependence on renal dialysis: Secondary | ICD-10-CM | POA: Diagnosis not present

## 2019-07-06 DIAGNOSIS — Z23 Encounter for immunization: Secondary | ICD-10-CM | POA: Diagnosis not present

## 2019-07-06 DIAGNOSIS — N186 End stage renal disease: Secondary | ICD-10-CM | POA: Diagnosis not present

## 2019-07-07 DIAGNOSIS — Z992 Dependence on renal dialysis: Secondary | ICD-10-CM | POA: Diagnosis not present

## 2019-07-07 DIAGNOSIS — N186 End stage renal disease: Secondary | ICD-10-CM | POA: Diagnosis not present

## 2019-07-07 DIAGNOSIS — Z23 Encounter for immunization: Secondary | ICD-10-CM | POA: Diagnosis not present

## 2019-07-08 DIAGNOSIS — Z23 Encounter for immunization: Secondary | ICD-10-CM | POA: Diagnosis not present

## 2019-07-08 DIAGNOSIS — N186 End stage renal disease: Secondary | ICD-10-CM | POA: Diagnosis not present

## 2019-07-08 DIAGNOSIS — Z992 Dependence on renal dialysis: Secondary | ICD-10-CM | POA: Diagnosis not present

## 2019-07-10 DIAGNOSIS — N186 End stage renal disease: Secondary | ICD-10-CM | POA: Diagnosis not present

## 2019-07-10 DIAGNOSIS — Z992 Dependence on renal dialysis: Secondary | ICD-10-CM | POA: Diagnosis not present

## 2019-07-10 DIAGNOSIS — Z23 Encounter for immunization: Secondary | ICD-10-CM | POA: Diagnosis not present

## 2019-07-11 DIAGNOSIS — N186 End stage renal disease: Secondary | ICD-10-CM | POA: Diagnosis not present

## 2019-07-11 DIAGNOSIS — Z992 Dependence on renal dialysis: Secondary | ICD-10-CM | POA: Diagnosis not present

## 2019-07-11 DIAGNOSIS — Z23 Encounter for immunization: Secondary | ICD-10-CM | POA: Diagnosis not present

## 2019-07-12 DIAGNOSIS — Z23 Encounter for immunization: Secondary | ICD-10-CM | POA: Diagnosis not present

## 2019-07-12 DIAGNOSIS — N186 End stage renal disease: Secondary | ICD-10-CM | POA: Diagnosis not present

## 2019-07-12 DIAGNOSIS — Z992 Dependence on renal dialysis: Secondary | ICD-10-CM | POA: Diagnosis not present

## 2019-07-13 DIAGNOSIS — N186 End stage renal disease: Secondary | ICD-10-CM | POA: Diagnosis not present

## 2019-07-13 DIAGNOSIS — Z23 Encounter for immunization: Secondary | ICD-10-CM | POA: Diagnosis not present

## 2019-07-13 DIAGNOSIS — Z992 Dependence on renal dialysis: Secondary | ICD-10-CM | POA: Diagnosis not present

## 2019-07-14 DIAGNOSIS — Z992 Dependence on renal dialysis: Secondary | ICD-10-CM | POA: Diagnosis not present

## 2019-07-14 DIAGNOSIS — Z23 Encounter for immunization: Secondary | ICD-10-CM | POA: Diagnosis not present

## 2019-07-14 DIAGNOSIS — N186 End stage renal disease: Secondary | ICD-10-CM | POA: Diagnosis not present

## 2019-07-15 DIAGNOSIS — Z992 Dependence on renal dialysis: Secondary | ICD-10-CM | POA: Diagnosis not present

## 2019-07-15 DIAGNOSIS — N186 End stage renal disease: Secondary | ICD-10-CM | POA: Diagnosis not present

## 2019-07-15 DIAGNOSIS — Z23 Encounter for immunization: Secondary | ICD-10-CM | POA: Diagnosis not present

## 2019-07-17 DIAGNOSIS — Z992 Dependence on renal dialysis: Secondary | ICD-10-CM | POA: Diagnosis not present

## 2019-07-17 DIAGNOSIS — Z23 Encounter for immunization: Secondary | ICD-10-CM | POA: Diagnosis not present

## 2019-07-17 DIAGNOSIS — N186 End stage renal disease: Secondary | ICD-10-CM | POA: Diagnosis not present

## 2019-07-18 DIAGNOSIS — Z23 Encounter for immunization: Secondary | ICD-10-CM | POA: Diagnosis not present

## 2019-07-18 DIAGNOSIS — Z992 Dependence on renal dialysis: Secondary | ICD-10-CM | POA: Diagnosis not present

## 2019-07-18 DIAGNOSIS — N186 End stage renal disease: Secondary | ICD-10-CM | POA: Diagnosis not present

## 2019-07-19 ENCOUNTER — Other Ambulatory Visit: Payer: Medicare HMO

## 2019-07-19 ENCOUNTER — Ambulatory Visit: Payer: Medicare HMO | Admitting: Oncology

## 2019-07-19 DIAGNOSIS — Z992 Dependence on renal dialysis: Secondary | ICD-10-CM | POA: Diagnosis not present

## 2019-07-19 DIAGNOSIS — N186 End stage renal disease: Secondary | ICD-10-CM | POA: Diagnosis not present

## 2019-07-19 DIAGNOSIS — Z23 Encounter for immunization: Secondary | ICD-10-CM | POA: Diagnosis not present

## 2019-07-20 DIAGNOSIS — Z23 Encounter for immunization: Secondary | ICD-10-CM | POA: Diagnosis not present

## 2019-07-20 DIAGNOSIS — Z992 Dependence on renal dialysis: Secondary | ICD-10-CM | POA: Diagnosis not present

## 2019-07-20 DIAGNOSIS — N186 End stage renal disease: Secondary | ICD-10-CM | POA: Diagnosis not present

## 2019-07-21 DIAGNOSIS — Z23 Encounter for immunization: Secondary | ICD-10-CM | POA: Diagnosis not present

## 2019-07-21 DIAGNOSIS — Z992 Dependence on renal dialysis: Secondary | ICD-10-CM | POA: Diagnosis not present

## 2019-07-21 DIAGNOSIS — N186 End stage renal disease: Secondary | ICD-10-CM | POA: Diagnosis not present

## 2019-07-22 DIAGNOSIS — N186 End stage renal disease: Secondary | ICD-10-CM | POA: Diagnosis not present

## 2019-07-22 DIAGNOSIS — Z23 Encounter for immunization: Secondary | ICD-10-CM | POA: Diagnosis not present

## 2019-07-22 DIAGNOSIS — Z992 Dependence on renal dialysis: Secondary | ICD-10-CM | POA: Diagnosis not present

## 2019-07-23 DIAGNOSIS — Z992 Dependence on renal dialysis: Secondary | ICD-10-CM | POA: Diagnosis not present

## 2019-07-23 DIAGNOSIS — N186 End stage renal disease: Secondary | ICD-10-CM | POA: Diagnosis not present

## 2019-07-24 DIAGNOSIS — Z992 Dependence on renal dialysis: Secondary | ICD-10-CM | POA: Diagnosis not present

## 2019-07-24 DIAGNOSIS — N186 End stage renal disease: Secondary | ICD-10-CM | POA: Diagnosis not present

## 2019-07-25 DIAGNOSIS — N186 End stage renal disease: Secondary | ICD-10-CM | POA: Diagnosis not present

## 2019-07-25 DIAGNOSIS — Z992 Dependence on renal dialysis: Secondary | ICD-10-CM | POA: Diagnosis not present

## 2019-07-26 DIAGNOSIS — N186 End stage renal disease: Secondary | ICD-10-CM | POA: Diagnosis not present

## 2019-07-26 DIAGNOSIS — Z992 Dependence on renal dialysis: Secondary | ICD-10-CM | POA: Diagnosis not present

## 2019-07-27 ENCOUNTER — Inpatient Hospital Stay: Payer: Medicare HMO | Admitting: Internal Medicine

## 2019-07-27 ENCOUNTER — Inpatient Hospital Stay: Payer: Medicare HMO

## 2019-07-27 DIAGNOSIS — Z992 Dependence on renal dialysis: Secondary | ICD-10-CM | POA: Diagnosis not present

## 2019-07-27 DIAGNOSIS — N186 End stage renal disease: Secondary | ICD-10-CM | POA: Diagnosis not present

## 2019-07-28 DIAGNOSIS — Z992 Dependence on renal dialysis: Secondary | ICD-10-CM | POA: Diagnosis not present

## 2019-07-28 DIAGNOSIS — N186 End stage renal disease: Secondary | ICD-10-CM | POA: Diagnosis not present

## 2019-07-29 DIAGNOSIS — Z992 Dependence on renal dialysis: Secondary | ICD-10-CM | POA: Diagnosis not present

## 2019-07-29 DIAGNOSIS — N186 End stage renal disease: Secondary | ICD-10-CM | POA: Diagnosis not present

## 2019-07-31 DIAGNOSIS — N186 End stage renal disease: Secondary | ICD-10-CM | POA: Diagnosis not present

## 2019-07-31 DIAGNOSIS — Z992 Dependence on renal dialysis: Secondary | ICD-10-CM | POA: Diagnosis not present

## 2019-08-01 DIAGNOSIS — N186 End stage renal disease: Secondary | ICD-10-CM | POA: Diagnosis not present

## 2019-08-01 DIAGNOSIS — Z992 Dependence on renal dialysis: Secondary | ICD-10-CM | POA: Diagnosis not present

## 2019-08-02 DIAGNOSIS — Z992 Dependence on renal dialysis: Secondary | ICD-10-CM | POA: Diagnosis not present

## 2019-08-02 DIAGNOSIS — N186 End stage renal disease: Secondary | ICD-10-CM | POA: Diagnosis not present

## 2019-08-03 DIAGNOSIS — N186 End stage renal disease: Secondary | ICD-10-CM | POA: Diagnosis not present

## 2019-08-03 DIAGNOSIS — Z992 Dependence on renal dialysis: Secondary | ICD-10-CM | POA: Diagnosis not present

## 2019-08-04 DIAGNOSIS — N186 End stage renal disease: Secondary | ICD-10-CM | POA: Diagnosis not present

## 2019-08-04 DIAGNOSIS — Z992 Dependence on renal dialysis: Secondary | ICD-10-CM | POA: Diagnosis not present

## 2019-08-05 DIAGNOSIS — Z992 Dependence on renal dialysis: Secondary | ICD-10-CM | POA: Diagnosis not present

## 2019-08-05 DIAGNOSIS — N186 End stage renal disease: Secondary | ICD-10-CM | POA: Diagnosis not present

## 2019-08-07 DIAGNOSIS — Z992 Dependence on renal dialysis: Secondary | ICD-10-CM | POA: Diagnosis not present

## 2019-08-07 DIAGNOSIS — N186 End stage renal disease: Secondary | ICD-10-CM | POA: Diagnosis not present

## 2019-08-08 DIAGNOSIS — Z992 Dependence on renal dialysis: Secondary | ICD-10-CM | POA: Diagnosis not present

## 2019-08-08 DIAGNOSIS — N186 End stage renal disease: Secondary | ICD-10-CM | POA: Diagnosis not present

## 2019-08-09 ENCOUNTER — Inpatient Hospital Stay: Payer: Medicare HMO | Attending: Internal Medicine

## 2019-08-09 ENCOUNTER — Inpatient Hospital Stay (HOSPITAL_BASED_OUTPATIENT_CLINIC_OR_DEPARTMENT_OTHER): Payer: Medicare HMO | Admitting: Internal Medicine

## 2019-08-09 ENCOUNTER — Encounter: Payer: Self-pay | Admitting: Internal Medicine

## 2019-08-09 ENCOUNTER — Other Ambulatory Visit: Payer: Self-pay

## 2019-08-09 DIAGNOSIS — D471 Chronic myeloproliferative disease: Secondary | ICD-10-CM | POA: Diagnosis not present

## 2019-08-09 DIAGNOSIS — E785 Hyperlipidemia, unspecified: Secondary | ICD-10-CM | POA: Diagnosis not present

## 2019-08-09 DIAGNOSIS — Z8249 Family history of ischemic heart disease and other diseases of the circulatory system: Secondary | ICD-10-CM | POA: Diagnosis not present

## 2019-08-09 DIAGNOSIS — I10 Essential (primary) hypertension: Secondary | ICD-10-CM | POA: Insufficient documentation

## 2019-08-09 DIAGNOSIS — D631 Anemia in chronic kidney disease: Secondary | ICD-10-CM | POA: Insufficient documentation

## 2019-08-09 DIAGNOSIS — N186 End stage renal disease: Secondary | ICD-10-CM | POA: Diagnosis not present

## 2019-08-09 DIAGNOSIS — Z79899 Other long term (current) drug therapy: Secondary | ICD-10-CM | POA: Diagnosis not present

## 2019-08-09 DIAGNOSIS — N185 Chronic kidney disease, stage 5: Secondary | ICD-10-CM | POA: Insufficient documentation

## 2019-08-09 DIAGNOSIS — Z801 Family history of malignant neoplasm of trachea, bronchus and lung: Secondary | ICD-10-CM | POA: Diagnosis not present

## 2019-08-09 DIAGNOSIS — Z803 Family history of malignant neoplasm of breast: Secondary | ICD-10-CM | POA: Insufficient documentation

## 2019-08-09 DIAGNOSIS — F17210 Nicotine dependence, cigarettes, uncomplicated: Secondary | ICD-10-CM | POA: Insufficient documentation

## 2019-08-09 DIAGNOSIS — Z7982 Long term (current) use of aspirin: Secondary | ICD-10-CM | POA: Insufficient documentation

## 2019-08-09 DIAGNOSIS — Z992 Dependence on renal dialysis: Secondary | ICD-10-CM | POA: Diagnosis not present

## 2019-08-09 LAB — CBC WITH DIFFERENTIAL/PLATELET
Abs Immature Granulocytes: 0.03 10*3/uL (ref 0.00–0.07)
Basophils Absolute: 0 10*3/uL (ref 0.0–0.1)
Basophils Relative: 0 %
Eosinophils Absolute: 0 10*3/uL (ref 0.0–0.5)
Eosinophils Relative: 0 %
HCT: 32.2 % — ABNORMAL LOW (ref 39.0–52.0)
Hemoglobin: 10.3 g/dL — ABNORMAL LOW (ref 13.0–17.0)
Immature Granulocytes: 1 %
Lymphocytes Relative: 9 %
Lymphs Abs: 0.4 10*3/uL — ABNORMAL LOW (ref 0.7–4.0)
MCH: 29.5 pg (ref 26.0–34.0)
MCHC: 32 g/dL (ref 30.0–36.0)
MCV: 92.3 fL (ref 80.0–100.0)
Monocytes Absolute: 0.2 10*3/uL (ref 0.1–1.0)
Monocytes Relative: 3 %
Neutro Abs: 4.2 10*3/uL (ref 1.7–7.7)
Neutrophils Relative %: 87 %
Platelets: 94 10*3/uL — ABNORMAL LOW (ref 150–400)
RBC: 3.49 MIL/uL — ABNORMAL LOW (ref 4.22–5.81)
RDW: 16.9 % — ABNORMAL HIGH (ref 11.5–15.5)
WBC: 4.8 10*3/uL (ref 4.0–10.5)
nRBC: 0 % (ref 0.0–0.2)

## 2019-08-09 LAB — LACTATE DEHYDROGENASE: LDH: 490 U/L — ABNORMAL HIGH (ref 98–192)

## 2019-08-09 LAB — BASIC METABOLIC PANEL
Anion gap: 8 (ref 5–15)
BUN: 38 mg/dL — ABNORMAL HIGH (ref 8–23)
CO2: 24 mmol/L (ref 22–32)
Calcium: 8.2 mg/dL — ABNORMAL LOW (ref 8.9–10.3)
Chloride: 104 mmol/L (ref 98–111)
Creatinine, Ser: 3.86 mg/dL — ABNORMAL HIGH (ref 0.61–1.24)
GFR calc Af Amer: 18 mL/min — ABNORMAL LOW (ref >60)
GFR calc non Af Amer: 16 mL/min — ABNORMAL LOW (ref >60)
Glucose, Bld: 139 mg/dL — ABNORMAL HIGH (ref 70–99)
Potassium: 4.2 mmol/L (ref 3.5–5.1)
Sodium: 136 mmol/L (ref 135–145)

## 2019-08-09 NOTE — Progress Notes (Signed)
[place] OFFICE PROGRESS NOTE  Patient Care Team: Valerie Roys, DO as PCP - General (Family Medicine) End, Harrell Gave, MD as PCP - Cardiology (Cardiology) Cammie Sickle, MD as Consulting Physician (Internal Medicine)  Cancer Staging No matching staging information was found for the patient.   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   # AUGUST 1st week 2019- Retacrit   # October 2nd- jakafi 10 BID [? 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 [name]- kidney Bx- FSG [ on Prednisone Dr.[name]]; July 2020-peritoneal dialysis;   # hx of Lung nodules- resolved [[name]]/quit smoking.  DIAGNOSIS: PRIMARY MYELOFIBROSIS  RISK:LOW     ;GOALS: Control  CURRENT/MOST RECENT THERAPY : Jakafi     Primary myelofibrosis ([place])      INTERVAL HISTORY:  James Arnold 63 y.o.  male pleasant patient above history of primary myelofibrosis Jak 2+ currently on Jakafi 10 milligrams once a day [name] dosed/peritoneal dialysis] and also chronic kidney disease/FSG on prednisone is here for follow-up.   Patient continues to chronic mild fatigue.  Not any worse.  He states to be getting erythropoietin injections with nephrology.  No weight loss.  No night sweats.  No fevers or chills.   Review of Systems  Constitutional: Positive for malaise/fatigue. Negative for chills, diaphoresis, fever and weight loss.  HENT: Negative for nosebleeds and sore throat.   Eyes: Negative for double vision.  Respiratory: Positive for shortness of breath. Negative for cough, hemoptysis, sputum production and  wheezing.   Cardiovascular: Negative for chest pain, palpitations and orthopnea.  Gastrointestinal: Negative for abdominal pain, blood in stool, constipation, diarrhea, heartburn, melena, nausea and vomiting.  Genitourinary: Negative for dysuria, frequency and urgency.  Musculoskeletal: Negative for back pain and joint pain.  Skin: Negative.  Negative for itching and rash.  Neurological: Negative for dizziness, tingling, focal weakness, weakness and headaches.  Endo/Heme/Allergies: Does not bruise/bleed easily.  Psychiatric/Behavioral: Negative for depression. The patient is not nervous/anxious and does not have insomnia.       PAST MEDICAL HISTORY :  Past Medical History:  Diagnosis Date  . Acute renal failure (ARF) ([place]) 05/15/2017  . Anemia   . Benign hypertensive renal disease   . CAD (coronary artery disease)    a. 08/1996 s/p BMS to the mLAD ([facility]); b. 12/2017 MV: EF 46%, fixed apical defect w/ significant GI uptake artifact. No ischemia-> low risk.  . CKD (chronic kidney disease), stage IV ([place])   . Depression   . Diastolic dysfunction    a. 12/2017 Echo: EF 60-65%, no rwma, Gr1 DD, mild AI/MR. Nl RVSP.  [name] Kitchen Dyspnea    with exertion  . FSGS (focal segmental glomerulosclerosis)   . Hyperlipidemia 10/2002  . Hypertension   . Myelofibrosis ([place]) 2010  . Smoker   . Thrombocytopenia ([place])     PAST SURGICAL HISTORY :   Past Surgical History:  Procedure Laterality Date  . ANGIOPLASTY    . BONE MARROW ASPIRATION  03/2009  . CARDIAC CATHETERIZATION    . COLONOSCOPY WITH PROPOFOL N/A 08/09/2018   Procedure: COLONOSCOPY WITH PROPOFOL;  Surgeon: [name], MD;  Location: [facility] ENDOSCOPY;  Service: Gastroenterology;  Laterality: N/A;  .  CORONARY ARTERY BYPASS GRAFT    . CORONARY STENT PLACEMENT  08/1996    FAMILY HISTORY :   Family History  Problem Relation Age of Onset  . Stroke Mother        Low BP stroke  . Depression Father   . Depression Sister        Breast  . Heart  disease Other   . Breast cancer Other   . Lung cancer Other   . Ovarian cancer Other   . Stomach cancer Other     SOCIAL HISTORY:   Social History   Tobacco Use  . Smoking status: Current Every Day Smoker    Packs/day: 1.00    Types: Cigarettes    Last attempt to quit: 01/11/2018    Years since quitting: 1.5  . Smokeless tobacco: Never Used  Substance Use Topics  . Alcohol use: No    Alcohol/week: 0.0 standard drinks    Frequency: Never    Comment: patient states he has not consumed alcohol in the last month  . Drug use: No    ALLERGIES:  has No Known Allergies.  MEDICATIONS:  Current Outpatient Medications  Medication Sig Dispense Refill  . acyclovir (ZOVIRAX) 200 MG capsule Take 1 capsule (200 mg total) by mouth 2 (two) times daily. 60 capsule 6  . allopurinol (ZYLOPRIM) 100 MG tablet Take 0.5 tablets (50 mg total) by mouth daily. 45 tablet 3  . aspirin EC 81 MG tablet Take by mouth.    . calcitRIOL (ROCALTROL) 0.25 MCG capsule Take 1 capsule by mouth daily.    . Cholecalciferol (VITAMIN D-1000 MAX ST) 25 MCG (1000 UT) tablet Take by mouth.    . citalopram (CELEXA) 20 MG tablet TAKE 1 TABLET(20 MG) BY MOUTH DAILY 90 tablet 1  . epoetin alfa-epbx (RETACRIT) 17510 UNIT/ML injection Inject into the skin.    . ferrous sulfate 325 (65 FE) MG tablet Take 1 tablet (325 mg total) by mouth daily with breakfast. 30 tablet 1  . furosemide (LASIX) 20 MG tablet Take 1 tablet (20 mg total) by mouth daily. 90 tablet 1  . predniSONE (DELTASONE) 10 MG tablet Take 10 mg by mouth daily with breakfast.    . ruxolitinib phosphate (JAKAFI) 10 MG tablet Take 1 tablet (10 mg total) by mouth daily. 60 tablet 4  . traMADol (ULTRAM) 50 MG tablet Take 1 tablet (50 mg total) by mouth every 8 (eight) hours as needed. 21 tablet 0  . amLODipine (NORVASC) 10 MG tablet Take 1 tablet (10 mg total) by mouth daily. 90 tablet 1  . carvedilol (COREG) 12.5 MG tablet Take 1 tablet (12.5 mg total) by mouth 2 (two)  times daily. 180 tablet 1   No current facility-administered medications for this visit.     PHYSICAL EXAMINATION: ECOG PERFORMANCE STATUS: 1 - Symptomatic but completely ambulatory  BP (!) 145/74 (BP Location: Right Arm, Patient Position: Sitting)   Pulse 84   Temp 99.1 F (37.3 C) (Tympanic)   Resp 16   Wt 185 lb (83.9 kg)   BMI 28.98 kg/m   Filed Weights   08/09/19 1353  Weight: 185 lb (83.9 kg)    Physical Exam  Constitutional: He is oriented to person, place, and time and well-developed, well-nourished, and in no distress.  He is alone.   HENT:  Head: Normocephalic and atraumatic.  Mouth/Throat: Oropharynx is clear and moist. No oropharyngeal exudate.  Eyes: Pupils are equal, round, and reactive to light.  Neck: Normal range  of motion. Neck supple.  Cardiovascular: Normal rate and regular rhythm.  Pulmonary/Chest: No respiratory distress. He has no wheezes.  Decreased air entry bilaterally.  Abdominal: Soft. Bowel sounds are normal. He exhibits no distension and no mass. There is no abdominal tenderness. There is no rebound and no guarding.  Musculoskeletal: Normal range of motion.        General: No tenderness or edema.  Neurological: He is alert and oriented to person, place, and time.  Skin: Skin is warm.  Psychiatric: Affect normal.       LABORATORY DATA:  I have reviewed the data as listed    Component Value Date/Time   NA 136 08/09/2019 1340   NA 137 03/14/2019 1000   K 4.2 08/09/2019 1340   CL 104 08/09/2019 1340   CO2 24 08/09/2019 1340   GLUCOSE 139 (H) 08/09/2019 1340   BUN 38 (H) 08/09/2019 1340   BUN 58 (H) 03/14/2019 1000   CREATININE 3.86 (H) 08/09/2019 1340   CREATININE 1.12 09/30/2013 0953   CALCIUM 8.2 (L) 08/09/2019 1340   PROT 6.1 03/14/2019 1000   ALBUMIN 4.6 03/14/2019 1000   AST 12 03/14/2019 1000   ALT 15 03/14/2019 1000   ALKPHOS 39 03/14/2019 1000   BILITOT 0.4 03/14/2019 1000   GFRNONAA 16 (L) 08/09/2019 1340   GFRNONAA  >60 09/30/2013 0953   GFRAA 18 (L) 08/09/2019 1340   GFRAA >60 09/30/2013 0953    No results found for: SPEP, UPEP  Lab Results  Component Value Date   WBC 4.8 08/09/2019   NEUTROABS 4.2 08/09/2019   HGB 10.3 (L) 08/09/2019   HCT 32.2 (L) 08/09/2019   MCV 92.3 08/09/2019   PLT 94 (L) 08/09/2019      Chemistry      Component Value Date/Time   NA 136 08/09/2019 1340   NA 137 03/14/2019 1000   K 4.2 08/09/2019 1340   CL 104 08/09/2019 1340   CO2 24 08/09/2019 1340   BUN 38 (H) 08/09/2019 1340   BUN 58 (H) 03/14/2019 1000   CREATININE 3.86 (H) 08/09/2019 1340   CREATININE 1.12 09/30/2013 0953      Component Value Date/Time   CALCIUM 8.2 (L) 08/09/2019 1340   ALKPHOS 39 03/14/2019 1000   AST 12 03/14/2019 1000   ALT 15 03/14/2019 1000   BILITOT 0.4 03/14/2019 1000       RADIOGRAPHIC STUDIES: I have personally reviewed the radiological images as listed and agreed with the findings in the report. No results found.   ASSESSMENT & PLAN:  Primary myelofibrosis ([place]) # Primary Myelofiborisis [Bone marrow 2010] jak-2 positive;  Currently on jakafi since October 2nd 2019.LDH-490;  STABLE.   # July 2020- US spleen- 630 [similar to 580 cc in Feb 2019];Jakafi 10 mg once a day in AM while on  PD.  Will order ultrasound at next visit.  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  hemoglobin 10.3; STABLE.    #CKD stage IV-V [Dr. Latif]  on PD; STABLE.  #Antimicrobial prophylaxis-acyclovir 200 mg twice a day. STABLE.   # DISPOSITION:labs copy # follow up in 2 months-labs- MD-cbc/bmp/LDH-Dr.B    Cc; Dr.[name] /Dr.Johnson   Orders Placed This Encounter  Procedures  . CBC with Differential    Standing Status:   Future    Standing Expiration Date:   08/08/2020  . Basic metabolic panel    Standing Status:   Future    Standing Expiration Date:  08/08/2020  . Lactate dehydrogenase    Standing  Status:   Future    Standing Expiration Date:   08/08/2020   All questions were answered. The patient knows to call the clinic with any problems, questions or concerns.     Cammie Sickle, MD 08/09/2019 3:20 PM

## 2019-08-09 NOTE — Assessment & Plan Note (Addendum)
#   Primary Myelofiborisis [Bone marrow 2010] jak-2 positive;  Currently on jakafi since October 2nd 2019.LDH-490;  STABLE.   # July 2020- US spleen- 630 [similar to 580 cc in Feb 2019];Jakafi 10 mg once a day in AM while on  PD.  Will order ultrasound at next visit.  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  hemoglobin 10.3; STABLE.    #CKD stage IV-V [Dr. Latif]  on PD; STABLE.  #Antimicrobial prophylaxis-acyclovir 200 mg twice a day. STABLE.   # DISPOSITION:labs copy # follow up in 2 months-labs- MD-cbc/bmp/LDH-Dr.B    Cc; Dr.[name] /Dr.Johnson

## 2019-08-10 DIAGNOSIS — N186 End stage renal disease: Secondary | ICD-10-CM | POA: Diagnosis not present

## 2019-08-10 DIAGNOSIS — Z992 Dependence on renal dialysis: Secondary | ICD-10-CM | POA: Diagnosis not present

## 2019-08-11 DIAGNOSIS — Z992 Dependence on renal dialysis: Secondary | ICD-10-CM | POA: Diagnosis not present

## 2019-08-11 DIAGNOSIS — N186 End stage renal disease: Secondary | ICD-10-CM | POA: Diagnosis not present

## 2019-08-12 DIAGNOSIS — Z992 Dependence on renal dialysis: Secondary | ICD-10-CM | POA: Diagnosis not present

## 2019-08-12 DIAGNOSIS — N186 End stage renal disease: Secondary | ICD-10-CM | POA: Diagnosis not present

## 2019-08-14 DIAGNOSIS — Z992 Dependence on renal dialysis: Secondary | ICD-10-CM | POA: Diagnosis not present

## 2019-08-14 DIAGNOSIS — N186 End stage renal disease: Secondary | ICD-10-CM | POA: Diagnosis not present

## 2019-08-15 DIAGNOSIS — Z992 Dependence on renal dialysis: Secondary | ICD-10-CM | POA: Diagnosis not present

## 2019-08-15 DIAGNOSIS — N186 End stage renal disease: Secondary | ICD-10-CM | POA: Diagnosis not present

## 2019-08-16 DIAGNOSIS — Z992 Dependence on renal dialysis: Secondary | ICD-10-CM | POA: Diagnosis not present

## 2019-08-16 DIAGNOSIS — N186 End stage renal disease: Secondary | ICD-10-CM | POA: Diagnosis not present

## 2019-08-17 DIAGNOSIS — Z992 Dependence on renal dialysis: Secondary | ICD-10-CM | POA: Diagnosis not present

## 2019-08-17 DIAGNOSIS — N186 End stage renal disease: Secondary | ICD-10-CM | POA: Diagnosis not present

## 2019-08-17 MED FILL — JAKAFI 10 MG TABLET: 10 | 30 days supply | Qty: 30 | Fill #1

## 2019-08-18 DIAGNOSIS — Z992 Dependence on renal dialysis: Secondary | ICD-10-CM | POA: Diagnosis not present

## 2019-08-18 DIAGNOSIS — N186 End stage renal disease: Secondary | ICD-10-CM | POA: Diagnosis not present

## 2019-08-19 DIAGNOSIS — N186 End stage renal disease: Secondary | ICD-10-CM | POA: Diagnosis not present

## 2019-08-19 DIAGNOSIS — Z992 Dependence on renal dialysis: Secondary | ICD-10-CM | POA: Diagnosis not present

## 2019-08-21 DIAGNOSIS — Z992 Dependence on renal dialysis: Secondary | ICD-10-CM | POA: Diagnosis not present

## 2019-08-21 DIAGNOSIS — N186 End stage renal disease: Secondary | ICD-10-CM | POA: Diagnosis not present

## 2019-08-22 DIAGNOSIS — Z992 Dependence on renal dialysis: Secondary | ICD-10-CM | POA: Diagnosis not present

## 2019-08-22 DIAGNOSIS — N186 End stage renal disease: Secondary | ICD-10-CM | POA: Diagnosis not present

## 2019-08-23 DIAGNOSIS — Z992 Dependence on renal dialysis: Secondary | ICD-10-CM | POA: Diagnosis not present

## 2019-08-23 DIAGNOSIS — N186 End stage renal disease: Secondary | ICD-10-CM | POA: Diagnosis not present

## 2019-08-24 DIAGNOSIS — Z992 Dependence on renal dialysis: Secondary | ICD-10-CM | POA: Diagnosis not present

## 2019-08-24 DIAGNOSIS — N186 End stage renal disease: Secondary | ICD-10-CM | POA: Diagnosis not present

## 2019-08-25 DIAGNOSIS — Z992 Dependence on renal dialysis: Secondary | ICD-10-CM | POA: Diagnosis not present

## 2019-08-25 DIAGNOSIS — N186 End stage renal disease: Secondary | ICD-10-CM | POA: Diagnosis not present

## 2019-08-26 DIAGNOSIS — N186 End stage renal disease: Secondary | ICD-10-CM | POA: Diagnosis not present

## 2019-08-26 DIAGNOSIS — Z992 Dependence on renal dialysis: Secondary | ICD-10-CM | POA: Diagnosis not present

## 2019-08-28 DIAGNOSIS — N186 End stage renal disease: Secondary | ICD-10-CM | POA: Diagnosis not present

## 2019-08-28 DIAGNOSIS — Z992 Dependence on renal dialysis: Secondary | ICD-10-CM | POA: Diagnosis not present

## 2019-08-29 ENCOUNTER — Other Ambulatory Visit: Payer: Self-pay

## 2019-08-29 DIAGNOSIS — Z20822 Contact with and (suspected) exposure to covid-19: Secondary | ICD-10-CM

## 2019-08-29 DIAGNOSIS — Z992 Dependence on renal dialysis: Secondary | ICD-10-CM | POA: Diagnosis not present

## 2019-08-29 DIAGNOSIS — N186 End stage renal disease: Secondary | ICD-10-CM | POA: Diagnosis not present

## 2019-08-30 DIAGNOSIS — Z992 Dependence on renal dialysis: Secondary | ICD-10-CM | POA: Diagnosis not present

## 2019-08-30 DIAGNOSIS — N186 End stage renal disease: Secondary | ICD-10-CM | POA: Diagnosis not present

## 2019-08-30 LAB — NOVEL CORONAVIRUS, NAA: SARS-CoV-2, NAA: DETECTED — AB

## 2019-08-31 ENCOUNTER — Telehealth: Payer: Self-pay | Admitting: Nurse Practitioner

## 2019-08-31 DIAGNOSIS — Z992 Dependence on renal dialysis: Secondary | ICD-10-CM | POA: Diagnosis not present

## 2019-08-31 DIAGNOSIS — N186 End stage renal disease: Secondary | ICD-10-CM | POA: Diagnosis not present

## 2019-08-31 NOTE — Telephone Encounter (Signed)
Called to Discuss with patient about Covid symptoms and the use of bamlanivimab, a monoclonal antibody infusion for those with mild to moderate Covid symptoms and at a high risk of hospitalization.     Pt is qualified for this infusion at the [facility] infusion center due to co-morbid conditions and/or a member of an at-risk group.     Patient Active Problem List   Diagnosis Date Noted  . Secondary hyperparathyroidism ([place]) 03/14/2019  . 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  . Anemia of chronic kidney failure, stage 3 (moderate) 12/30/2017  . Proteinuria 11/25/2017  . Gout 08/27/2017  . Primary myelofibrosis ([place])   . Depression   . Benign hypertensive renal disease   . CKD (chronic kidney disease) stage 4, GFR 15-29 ml/min (H[facility])   . Hyperlipidemia 10/23/2002  . CAD (coronary artery disease) 08/22/1996   Patient is not having any symptoms and does not qualify for infusion.

## 2019-09-01 DIAGNOSIS — N186 End stage renal disease: Secondary | ICD-10-CM | POA: Diagnosis not present

## 2019-09-01 DIAGNOSIS — Z992 Dependence on renal dialysis: Secondary | ICD-10-CM | POA: Diagnosis not present

## 2019-09-02 DIAGNOSIS — Z992 Dependence on renal dialysis: Secondary | ICD-10-CM | POA: Diagnosis not present

## 2019-09-02 DIAGNOSIS — N186 End stage renal disease: Secondary | ICD-10-CM | POA: Diagnosis not present

## 2019-09-04 DIAGNOSIS — Z992 Dependence on renal dialysis: Secondary | ICD-10-CM | POA: Diagnosis not present

## 2019-09-04 DIAGNOSIS — N186 End stage renal disease: Secondary | ICD-10-CM | POA: Diagnosis not present

## 2019-09-05 DIAGNOSIS — N186 End stage renal disease: Secondary | ICD-10-CM | POA: Diagnosis not present

## 2019-09-05 DIAGNOSIS — Z992 Dependence on renal dialysis: Secondary | ICD-10-CM | POA: Diagnosis not present

## 2019-09-06 DIAGNOSIS — N186 End stage renal disease: Secondary | ICD-10-CM | POA: Diagnosis not present

## 2019-09-06 DIAGNOSIS — Z992 Dependence on renal dialysis: Secondary | ICD-10-CM | POA: Diagnosis not present

## 2019-09-07 DIAGNOSIS — N186 End stage renal disease: Secondary | ICD-10-CM | POA: Diagnosis not present

## 2019-09-07 DIAGNOSIS — Z992 Dependence on renal dialysis: Secondary | ICD-10-CM | POA: Diagnosis not present

## 2019-09-08 DIAGNOSIS — N186 End stage renal disease: Secondary | ICD-10-CM | POA: Diagnosis not present

## 2019-09-08 DIAGNOSIS — Z992 Dependence on renal dialysis: Secondary | ICD-10-CM | POA: Diagnosis not present

## 2019-09-09 DIAGNOSIS — Z992 Dependence on renal dialysis: Secondary | ICD-10-CM | POA: Diagnosis not present

## 2019-09-09 DIAGNOSIS — N186 End stage renal disease: Secondary | ICD-10-CM | POA: Diagnosis not present

## 2019-09-11 DIAGNOSIS — Z992 Dependence on renal dialysis: Secondary | ICD-10-CM | POA: Diagnosis not present

## 2019-09-11 DIAGNOSIS — N186 End stage renal disease: Secondary | ICD-10-CM | POA: Diagnosis not present

## 2019-09-12 DIAGNOSIS — N186 End stage renal disease: Secondary | ICD-10-CM | POA: Diagnosis not present

## 2019-09-12 DIAGNOSIS — Z992 Dependence on renal dialysis: Secondary | ICD-10-CM | POA: Diagnosis not present

## 2019-09-13 ENCOUNTER — Ambulatory Visit (INDEPENDENT_AMBULATORY_CARE_PROVIDER_SITE_OTHER): Payer: Medicare HMO | Admitting: Family Medicine

## 2019-09-13 ENCOUNTER — Other Ambulatory Visit: Payer: Self-pay

## 2019-09-13 ENCOUNTER — Encounter: Payer: Self-pay | Admitting: Family Medicine

## 2019-09-13 VITALS — BP 108/70 | HR 99 | Temp 97.3°F | Wt 176.5 lb

## 2019-09-13 DIAGNOSIS — I129 Hypertensive chronic kidney disease with stage 1 through stage 4 chronic kidney disease, or unspecified chronic kidney disease: Secondary | ICD-10-CM

## 2019-09-13 DIAGNOSIS — F339 Major depressive disorder, recurrent, unspecified: Secondary | ICD-10-CM

## 2019-09-13 DIAGNOSIS — N186 End stage renal disease: Secondary | ICD-10-CM | POA: Diagnosis not present

## 2019-09-13 DIAGNOSIS — J432 Centrilobular emphysema: Secondary | ICD-10-CM | POA: Diagnosis not present

## 2019-09-13 DIAGNOSIS — D7581 Myelofibrosis: Secondary | ICD-10-CM | POA: Diagnosis not present

## 2019-09-13 DIAGNOSIS — E782 Mixed hyperlipidemia: Secondary | ICD-10-CM

## 2019-09-13 DIAGNOSIS — N184 Chronic kidney disease, stage 4 (severe): Secondary | ICD-10-CM

## 2019-09-13 DIAGNOSIS — M109 Gout, unspecified: Secondary | ICD-10-CM

## 2019-09-13 DIAGNOSIS — D471 Chronic myeloproliferative disease: Secondary | ICD-10-CM

## 2019-09-13 DIAGNOSIS — Z992 Dependence on renal dialysis: Secondary | ICD-10-CM | POA: Diagnosis not present

## 2019-09-13 MED ORDER — LOSARTAN POTASSIUM 50 MG PO TABS: 50.0000 mg | ORAL_TABLET | Freq: Every day | ORAL | 1 refills | Status: DC

## 2019-09-13 MED ORDER — AMLODIPINE BESYLATE 10 MG PO TABS: 10.0000 mg | ORAL_TABLET | Freq: Every day | ORAL | 1 refills | Status: DC

## 2019-09-13 MED ORDER — FUROSEMIDE 20 MG PO TABS: 20.0000 mg | ORAL_TABLET | Freq: Every day | ORAL | 1 refills | Status: DC

## 2019-09-13 MED ORDER — CITALOPRAM HYDROBROMIDE 40 MG PO TABS: 40.0000 mg | ORAL_TABLET | Freq: Every day | ORAL | 1 refills | Status: DC

## 2019-09-13 MED ORDER — CARVEDILOL 12.5 MG PO TABS: 12.5000 mg | ORAL_TABLET | Freq: Two times a day (BID) | ORAL | 1 refills | Status: DC

## 2019-09-13 NOTE — Assessment & Plan Note (Signed)
Under good control on current regimen. Continue current regimen. Continue to monitor. Call with any concerns. Refills given. Will check labs at his physical.

## 2019-09-13 NOTE — Assessment & Plan Note (Signed)
Not under great control- will increase to 40mg  and recheck 1 month. Call with any concerns.

## 2019-09-13 NOTE — Assessment & Plan Note (Signed)
Under good control on current regimen. Continue current regimen. Continue to monitor. Call with any concerns.   

## 2019-09-13 NOTE — Assessment & Plan Note (Signed)
Continue to follow with nephrology. Call with any concerns.  

## 2019-09-13 NOTE — Progress Notes (Signed)
BP 108/70   Pulse 99   Temp (!) 97.3 F (36.3 C)   Wt 176 lb 8 oz (80.1 kg)   BMI 27.64 kg/m    Subjective:    [name], male    DOB: June 02, 1956, 63 y.o.   MRN: [id number]  HPI: James Arnold is a 63 y.o. male  Chief Complaint  Patient presents with  . Hypertension  . Hyperlipidemia  . Depression   Diagnosed with COVID. Has been doing OK.   HYPERTENSION / HYPERLIPIDEMIA Satisfied with current treatment? no Duration of hypertension: chronic BP monitoring frequency: not checking BP medication side effects: no Past BP meds: losartan, lasix, amlodipine, carvedilol Duration of hyperlipidemia: chronic Cholesterol medication side effects: not on anything Cholesterol supplements: none Past cholesterol medications: none Medication compliance: excellent compliance Aspirin: yes Recent stressors: no Recurrent headaches: no Visual changes: no Palpitations: no Dyspnea: no Chest pain: no Lower extremity edema: no Dizzy/lightheaded: no  DEPRESSION Mood status: worse Satisfied with current treatment?: no Symptom severity: moderate  Duration of current treatment : chronic Side effects: no Medication compliance: excellent compliance Psychotherapy/counseling: no  Previous psychiatric medications: celexa Depressed mood: yes Anxious mood: yes Anhedonia: no Significant weight loss or gain: no Insomnia: no  Fatigue: yes Feelings of worthlessness or guilt: no Impaired concentration/indecisiveness: no Suicidal ideations: no Hopelessness: no Crying spells: no Depression screen [facility] 2/9 09/13/2019 03/14/2019 07/13/2018 02/19/2018 01/07/2018  Decreased Interest 0 1 1 1 1   Down, Depressed, Hopeless 0 1 0 0 1  PHQ - 2 Score 0 2 1 1 2   Altered sleeping 1 0 1 2 2   Tired, decreased energy 1 1 2 2 1   Change in appetite 0 1 0 0 0  Feeling bad or failure about yourself  0 0 0 0 0  Trouble concentrating 0 1 0 0 0  Moving slowly or fidgety/restless 0 0 0 0 1  Suicidal  thoughts 0 0 - 0 0  PHQ-9 Score 2 5 4 5 6   Difficult doing work/chores Not difficult at all Not difficult at all Not difficult at all Not difficult at all Somewhat difficult    Relevant past medical, surgical, family and social history reviewed and updated as indicated. Interim medical history since our last visit reviewed. Allergies and medications reviewed and updated.  Review of Systems  Constitutional: Positive for fatigue. Negative for activity change, appetite change, chills, diaphoresis, fever and unexpected weight change.  HENT: Negative.   Respiratory: Negative.   Cardiovascular: Negative.   Gastrointestinal: Negative.   Musculoskeletal: Positive for arthralgias and myalgias. Negative for back pain, gait problem, joint swelling, neck pain and neck stiffness.  Skin: Negative.   Neurological: Negative.   Psychiatric/Behavioral: Positive for dysphoric mood. Negative for agitation, behavioral problems, confusion, decreased concentration, hallucinations, self-injury, sleep disturbance and suicidal ideas. The patient is not nervous/anxious and is not hyperactive.     Per HPI unless specifically indicated above     Objective:    BP 108/70   Pulse 99   Temp (!) 97.3 F (36.3 C)   Wt 176 lb 8 oz (80.1 kg)   BMI 27.64 kg/m   Wt Readings from Last 3 Encounters:  09/13/19 176 lb 8 oz (80.1 kg)  08/09/19 185 lb (83.9 kg)  06/07/19 180 lb 14.4 oz (82.1 kg)    Physical Exam Vitals and nursing note reviewed.  Pulmonary:     Effort: Pulmonary effort is normal. No respiratory distress.     Comments: Speaking in  full sentences Neurological:     Mental Status: He is alert.  Psychiatric:        Mood and Affect: Mood normal.        Behavior: Behavior normal.        Thought Content: Thought content normal.        Judgment: Judgment normal.     Results for orders placed or performed in visit on 08/29/19  Novel Coronavirus, NAA ([facility])   Specimen: Nasopharyngeal(NP) swabs in  vial transport medium   NASOPHARYNGE  TESTING  Result Value Ref Range   SARS-CoV-2, NAA Detected (A) Not Detected      Assessment & Plan:   Problem List Items Addressed This Visit      Respiratory   Centrilobular emphysema ([place])    Under good control on current regimen. Continue current regimen. Continue to monitor. Call with any concerns.         Genitourinary   Benign hypertensive renal disease - Primary    Under good control on current regimen. Continue current regimen. Continue to monitor. Call with any concerns. Refills given. Will check labs at his physical.       Relevant Medications   furosemide (LASIX) 20 MG tablet   CKD (chronic kidney disease) stage 4, GFR 15-29 ml/min (HCC)    Continue to follow with nephrology. Call with any concerns.         Other   Primary myelofibrosis ([place])    Continue to follow with oncology. Call with any concerns. Stable.       Hyperlipidemia    Under good control on current regimen. Continue current regimen. Continue to monitor. Call with any concerns. Refills given. Will check labs at his physical.       Relevant Medications   amLODipine (NORVASC) 10 MG tablet   carvedilol (COREG) 12.5 MG tablet   furosemide (LASIX) 20 MG tablet   losartan (COZAAR) 50 MG tablet   Depression    Not under great control- will increase to 40mg  and recheck 1 month. Call with any concerns.       Relevant Medications   citalopram (CELEXA) 40 MG tablet   Gout    Under good control on current regimen. Continue current regimen. Continue to monitor. Call with any concerns. Refills given. Will check labs at his physical.        Other Visit Diagnoses    Myelofibrosis ([place])   (Chronic)         Follow up plan: Return in about 4 weeks (around 10/11/2019) for Wellness/phyiscial/follow up.   . This visit was completed via telephone due to the restrictions of the COVID-19 pandemic. All issues as above were discussed and addressed but no physical exam  was performed. If it was felt that the patient should be evaluated in the office, they were directed there. The patient verbally consented to this visit. Patient was unable to complete an audio/visual visit due to Lack of equipment. Due to the catastrophic nature of the COVID-19 pandemic, this visit was done through audio contact only. . Location of the patient: home . Location of the provider: work . Those involved with this call:  . Provider: Park Liter, DO . CMA: Tiffany Reel, CMA . Front Desk/Registration: Don Perking  . Time spent on call: 25 minutes on the phone discussing health concerns. 40 minutes total spent in review of patient's record and preparation of their chart.

## 2019-09-13 NOTE — Assessment & Plan Note (Signed)
Continue to follow with oncology. Call with any concerns. Stable.

## 2019-09-14 ENCOUNTER — Encounter: Payer: Self-pay | Admitting: Family Medicine

## 2019-09-14 DIAGNOSIS — N186 End stage renal disease: Secondary | ICD-10-CM | POA: Diagnosis not present

## 2019-09-14 DIAGNOSIS — Z992 Dependence on renal dialysis: Secondary | ICD-10-CM | POA: Diagnosis not present

## 2019-09-14 NOTE — Progress Notes (Signed)
LVM, Sent letter to [facility] 1 month physical / wellness

## 2019-09-15 DIAGNOSIS — N186 End stage renal disease: Secondary | ICD-10-CM | POA: Diagnosis not present

## 2019-09-15 DIAGNOSIS — Z992 Dependence on renal dialysis: Secondary | ICD-10-CM | POA: Diagnosis not present

## 2019-09-16 DIAGNOSIS — Z992 Dependence on renal dialysis: Secondary | ICD-10-CM | POA: Diagnosis not present

## 2019-09-16 DIAGNOSIS — N186 End stage renal disease: Secondary | ICD-10-CM | POA: Diagnosis not present

## 2019-09-18 DIAGNOSIS — N186 End stage renal disease: Secondary | ICD-10-CM | POA: Diagnosis not present

## 2019-09-18 DIAGNOSIS — Z992 Dependence on renal dialysis: Secondary | ICD-10-CM | POA: Diagnosis not present

## 2019-09-19 DIAGNOSIS — Z992 Dependence on renal dialysis: Secondary | ICD-10-CM | POA: Diagnosis not present

## 2019-09-19 DIAGNOSIS — N186 End stage renal disease: Secondary | ICD-10-CM | POA: Diagnosis not present

## 2019-09-20 DIAGNOSIS — N186 End stage renal disease: Secondary | ICD-10-CM | POA: Diagnosis not present

## 2019-09-20 DIAGNOSIS — Z992 Dependence on renal dialysis: Secondary | ICD-10-CM | POA: Diagnosis not present

## 2019-09-21 DIAGNOSIS — Z992 Dependence on renal dialysis: Secondary | ICD-10-CM | POA: Diagnosis not present

## 2019-09-21 DIAGNOSIS — N186 End stage renal disease: Secondary | ICD-10-CM | POA: Diagnosis not present

## 2019-09-22 DIAGNOSIS — Z992 Dependence on renal dialysis: Secondary | ICD-10-CM | POA: Diagnosis not present

## 2019-09-22 DIAGNOSIS — N186 End stage renal disease: Secondary | ICD-10-CM | POA: Diagnosis not present

## 2019-09-23 DIAGNOSIS — N186 End stage renal disease: Secondary | ICD-10-CM | POA: Diagnosis not present

## 2019-09-23 DIAGNOSIS — Z992 Dependence on renal dialysis: Secondary | ICD-10-CM | POA: Diagnosis not present

## 2019-09-25 DIAGNOSIS — Z992 Dependence on renal dialysis: Secondary | ICD-10-CM | POA: Diagnosis not present

## 2019-09-25 DIAGNOSIS — N186 End stage renal disease: Secondary | ICD-10-CM | POA: Diagnosis not present

## 2019-09-26 DIAGNOSIS — Z992 Dependence on renal dialysis: Secondary | ICD-10-CM | POA: Diagnosis not present

## 2019-09-26 DIAGNOSIS — N186 End stage renal disease: Secondary | ICD-10-CM | POA: Diagnosis not present

## 2019-09-27 DIAGNOSIS — N186 End stage renal disease: Secondary | ICD-10-CM | POA: Diagnosis not present

## 2019-09-27 DIAGNOSIS — Z992 Dependence on renal dialysis: Secondary | ICD-10-CM | POA: Diagnosis not present

## 2019-09-28 DIAGNOSIS — Z992 Dependence on renal dialysis: Secondary | ICD-10-CM | POA: Diagnosis not present

## 2019-09-28 DIAGNOSIS — N186 End stage renal disease: Secondary | ICD-10-CM | POA: Diagnosis not present

## 2019-09-29 DIAGNOSIS — I259 Chronic ischemic heart disease, unspecified: Secondary | ICD-10-CM | POA: Diagnosis not present

## 2019-09-29 DIAGNOSIS — N186 End stage renal disease: Secondary | ICD-10-CM | POA: Diagnosis not present

## 2019-09-29 DIAGNOSIS — Z992 Dependence on renal dialysis: Secondary | ICD-10-CM | POA: Diagnosis not present

## 2019-09-29 MED FILL — JAKAFI 10 MG TABLET: 10 | 30 days supply | Qty: 30 | Fill #2

## 2019-09-30 DIAGNOSIS — N186 End stage renal disease: Secondary | ICD-10-CM | POA: Diagnosis not present

## 2019-09-30 DIAGNOSIS — Z992 Dependence on renal dialysis: Secondary | ICD-10-CM | POA: Diagnosis not present

## 2019-10-02 DIAGNOSIS — Z992 Dependence on renal dialysis: Secondary | ICD-10-CM | POA: Diagnosis not present

## 2019-10-02 DIAGNOSIS — N186 End stage renal disease: Secondary | ICD-10-CM | POA: Diagnosis not present

## 2019-10-03 DIAGNOSIS — Z992 Dependence on renal dialysis: Secondary | ICD-10-CM | POA: Diagnosis not present

## 2019-10-03 DIAGNOSIS — N186 End stage renal disease: Secondary | ICD-10-CM | POA: Diagnosis not present

## 2019-10-04 DIAGNOSIS — N186 End stage renal disease: Secondary | ICD-10-CM | POA: Diagnosis not present

## 2019-10-04 DIAGNOSIS — Z992 Dependence on renal dialysis: Secondary | ICD-10-CM | POA: Diagnosis not present

## 2019-10-05 DIAGNOSIS — Z992 Dependence on renal dialysis: Secondary | ICD-10-CM | POA: Diagnosis not present

## 2019-10-05 DIAGNOSIS — N186 End stage renal disease: Secondary | ICD-10-CM | POA: Diagnosis not present

## 2019-10-06 DIAGNOSIS — N186 End stage renal disease: Secondary | ICD-10-CM | POA: Diagnosis not present

## 2019-10-06 DIAGNOSIS — Z992 Dependence on renal dialysis: Secondary | ICD-10-CM | POA: Diagnosis not present

## 2019-10-07 ENCOUNTER — Other Ambulatory Visit: Payer: Self-pay

## 2019-10-07 DIAGNOSIS — N186 End stage renal disease: Secondary | ICD-10-CM | POA: Diagnosis not present

## 2019-10-07 DIAGNOSIS — Z992 Dependence on renal dialysis: Secondary | ICD-10-CM | POA: Diagnosis not present

## 2019-10-09 DIAGNOSIS — N186 End stage renal disease: Secondary | ICD-10-CM | POA: Diagnosis not present

## 2019-10-09 DIAGNOSIS — Z992 Dependence on renal dialysis: Secondary | ICD-10-CM | POA: Diagnosis not present

## 2019-10-10 ENCOUNTER — Inpatient Hospital Stay (HOSPITAL_BASED_OUTPATIENT_CLINIC_OR_DEPARTMENT_OTHER): Payer: Medicare HMO | Admitting: Internal Medicine

## 2019-10-10 ENCOUNTER — Encounter: Payer: Self-pay | Admitting: Internal Medicine

## 2019-10-10 ENCOUNTER — Inpatient Hospital Stay: Payer: Medicare HMO | Attending: Internal Medicine

## 2019-10-10 ENCOUNTER — Other Ambulatory Visit: Payer: Self-pay

## 2019-10-10 VITALS — BP 150/71 | HR 67 | Temp 98.0°F | Wt 181.2 lb

## 2019-10-10 DIAGNOSIS — Z992 Dependence on renal dialysis: Secondary | ICD-10-CM | POA: Diagnosis not present

## 2019-10-10 DIAGNOSIS — R5382 Chronic fatigue, unspecified: Secondary | ICD-10-CM | POA: Diagnosis not present

## 2019-10-10 DIAGNOSIS — E785 Hyperlipidemia, unspecified: Secondary | ICD-10-CM | POA: Diagnosis not present

## 2019-10-10 DIAGNOSIS — Z7982 Long term (current) use of aspirin: Secondary | ICD-10-CM | POA: Diagnosis not present

## 2019-10-10 DIAGNOSIS — R5381 Other malaise: Secondary | ICD-10-CM | POA: Insufficient documentation

## 2019-10-10 DIAGNOSIS — D471 Chronic myeloproliferative disease: Secondary | ICD-10-CM | POA: Diagnosis not present

## 2019-10-10 DIAGNOSIS — F329 Major depressive disorder, single episode, unspecified: Secondary | ICD-10-CM | POA: Diagnosis not present

## 2019-10-10 DIAGNOSIS — I120 Hypertensive chronic kidney disease with stage 5 chronic kidney disease or end stage renal disease: Secondary | ICD-10-CM | POA: Diagnosis not present

## 2019-10-10 DIAGNOSIS — N186 End stage renal disease: Secondary | ICD-10-CM | POA: Diagnosis not present

## 2019-10-10 DIAGNOSIS — N185 Chronic kidney disease, stage 5: Secondary | ICD-10-CM | POA: Diagnosis not present

## 2019-10-10 DIAGNOSIS — Z79899 Other long term (current) drug therapy: Secondary | ICD-10-CM | POA: Diagnosis not present

## 2019-10-10 DIAGNOSIS — R0609 Other forms of dyspnea: Secondary | ICD-10-CM | POA: Insufficient documentation

## 2019-10-10 DIAGNOSIS — Z951 Presence of aortocoronary bypass graft: Secondary | ICD-10-CM | POA: Diagnosis not present

## 2019-10-10 DIAGNOSIS — Z7952 Long term (current) use of systemic steroids: Secondary | ICD-10-CM | POA: Insufficient documentation

## 2019-10-10 DIAGNOSIS — F17210 Nicotine dependence, cigarettes, uncomplicated: Secondary | ICD-10-CM | POA: Insufficient documentation

## 2019-10-10 DIAGNOSIS — I2510 Atherosclerotic heart disease of native coronary artery without angina pectoris: Secondary | ICD-10-CM | POA: Insufficient documentation

## 2019-10-10 LAB — CBC WITH DIFFERENTIAL/PLATELET
Abs Immature Granulocytes: 0.04 10*3/uL (ref 0.00–0.07)
Basophils Absolute: 0 10*3/uL (ref 0.0–0.1)
Basophils Relative: 0 %
Eosinophils Absolute: 0 10*3/uL (ref 0.0–0.5)
Eosinophils Relative: 0 %
HCT: 36.1 % — ABNORMAL LOW (ref 39.0–52.0)
Hemoglobin: 11.2 g/dL — ABNORMAL LOW (ref 13.0–17.0)
Immature Granulocytes: 1 %
Lymphocytes Relative: 9 %
Lymphs Abs: 0.5 10*3/uL — ABNORMAL LOW (ref 0.7–4.0)
MCH: 29.6 pg (ref 26.0–34.0)
MCHC: 31 g/dL (ref 30.0–36.0)
MCV: 95.3 fL (ref 80.0–100.0)
Monocytes Absolute: 0.2 10*3/uL (ref 0.1–1.0)
Monocytes Relative: 3 %
Neutro Abs: 5 10*3/uL (ref 1.7–7.7)
Neutrophils Relative %: 87 %
Platelets: 105 10*3/uL — ABNORMAL LOW (ref 150–400)
RBC: 3.79 MIL/uL — ABNORMAL LOW (ref 4.22–5.81)
RDW: 18.6 % — ABNORMAL HIGH (ref 11.5–15.5)
WBC: 5.8 10*3/uL (ref 4.0–10.5)
nRBC: 1.2 % — ABNORMAL HIGH (ref 0.0–0.2)

## 2019-10-10 LAB — BASIC METABOLIC PANEL
Anion gap: 10 (ref 5–15)
BUN: 47 mg/dL — ABNORMAL HIGH (ref 8–23)
CO2: 21 mmol/L — ABNORMAL LOW (ref 22–32)
Calcium: 9 mg/dL (ref 8.9–10.3)
Chloride: 103 mmol/L (ref 98–111)
Creatinine, Ser: 4.12 mg/dL — ABNORMAL HIGH (ref 0.61–1.24)
GFR calc Af Amer: 17 mL/min — ABNORMAL LOW (ref >60)
GFR calc non Af Amer: 14 mL/min — ABNORMAL LOW (ref >60)
Glucose, Bld: 107 mg/dL — ABNORMAL HIGH (ref 70–99)
Potassium: 5.3 mmol/L — ABNORMAL HIGH (ref 3.5–5.1)
Sodium: 134 mmol/L — ABNORMAL LOW (ref 135–145)

## 2019-10-10 LAB — LACTATE DEHYDROGENASE: LDH: 513 U/L — ABNORMAL HIGH (ref 98–192)

## 2019-10-10 NOTE — Patient Instructions (Signed)
Re: COVID-19 vaccination.  For more information/scheduling recommend call [place] health department- [phone], 8:30am-4:30pm.   

## 2019-10-10 NOTE — Progress Notes (Signed)
James Arnold OFFICE PROGRESS NOTE  Patient Care Team: James Roys, DO as PCP - General (Family Medicine) End, James Gave, MD as PCP - Cardiology (Cardiology) James Sickle, MD as Consulting Physician (Internal Medicine)  Cancer Staging No matching staging information was found for the patient.   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   # AUGUST 1st week 2019- Retacrit   # October 2nd- jakafi 10 BID [? 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 James Arnold 2019- kidney Bx- FSG [ on Prednisone Dr.[name]]; July 2020-peritoneal dialysis;   # hx of Lung nodules- resolved [[name]]/quit smoking.  DIAGNOSIS: PRIMARY MYELOFIBROSIS  RISK:LOW     ;GOALS: Control  CURRENT/MOST RECENT THERAPY : Jakafi     Primary myelofibrosis (James Arnold)      INTERVAL HISTORY:  James Arnold 64 y.o.  male pleasant patient above history of primary myelofibrosis Jak 2+ currently on Jakafi 10 milligrams once a day [name] dosed/peritoneal dialysis] and also chronic kidney disease/FSG on prednisone is here for follow-up.   Patient continues to complain of chronic fatigue.  Shortness of breath on exertion.   No night sweats but no weight loss.  No fevers or chills.  No early satiety.  No issues on dialysis.   Review of Systems  Constitutional: Positive for malaise/fatigue. Negative for chills, diaphoresis, fever and weight loss.  HENT: Negative for nosebleeds and sore throat.   Eyes: Negative for double vision.  Respiratory: Positive for shortness of breath. Negative for cough, hemoptysis, sputum production  and wheezing.   Cardiovascular: Negative for chest pain, palpitations and orthopnea.  Gastrointestinal: Negative for abdominal pain, blood in stool, constipation, diarrhea, heartburn, melena, nausea and vomiting.  Genitourinary: Negative for dysuria, frequency and urgency.  Musculoskeletal: Negative for back pain and joint pain.  Skin: Negative.  Negative for itching and rash.  Neurological: Negative for dizziness, tingling, focal weakness, weakness and headaches.  Endo/Heme/Allergies: Does not bruise/bleed easily.  Psychiatric/Behavioral: Negative for depression. The patient is not nervous/anxious and does not have insomnia.       PAST MEDICAL HISTORY :  Past Medical History:  Diagnosis Date  . Acute renal failure (ARF) (James Arnold) 05/15/2017  . Anemia   . Benign hypertensive renal disease   . CAD (coronary artery disease)    a. 08/1996 s/p BMS to the mLAD ([facility]); b. 12/2017 MV: EF 46%, fixed apical defect w/ significant GI uptake artifact. No ischemia-> low risk.  . CKD (chronic kidney disease), stage IV (James Arnold)   . Depression   . Diastolic dysfunction    a. 12/2017 Echo: EF 60-65%, no rwma, Gr1 DD, mild AI/MR. Nl RVSP.  [name] Kitchen Dyspnea    with exertion  . FSGS (focal segmental glomerulosclerosis)   . Hyperlipidemia 10/2002  . Hypertension   . Myelofibrosis (James Arnold) 2010  . Smoker   . Thrombocytopenia (James Arnold)     PAST SURGICAL HISTORY :   Past Surgical History:  Procedure Laterality Date  . ANGIOPLASTY    . BONE MARROW ASPIRATION  03/2009  . CARDIAC CATHETERIZATION    . COLONOSCOPY WITH PROPOFOL N/A 08/09/2018   Procedure: COLONOSCOPY WITH PROPOFOL;  Surgeon: James Bellows, MD;  Location[facility] ENDOSCOPY;  Service: Gastroenterology;  Laterality: N/A;  . CORONARY ARTERY BYPASS GRAFT    . CORONARY STENT PLACEMENT  08/1996    FAMILY HISTORY :   Family History  Problem Relation Age of Onset  . Stroke James Arnold        Low BP stroke  . Depression James Arnold   . Depression James Arnold        Breast  .  Heart disease James Arnold   . Breast cancer James Arnold   . Lung cancer James Arnold   . Ovarian cancer James Arnold   . Stomach cancer James Arnold     SOCIAL HISTORY:   Social History   Tobacco Use  . Smoking status: Current Every Day Smoker    Packs/day: 1.00    Types: Cigarettes    Last attempt to quit: 01/11/2018    Years since quitting: 1.7  . Smokeless tobacco: Never Used  Substance Use Topics  . Alcohol use: No    Alcohol/week: 0.0 standard drinks    Comment: patient states he has not consumed alcohol in the last month  . Drug use: No    ALLERGIES:  has No Known Allergies.  MEDICATIONS:  Current Outpatient Medications  Medication Sig Dispense Refill  . acyclovir (ZOVIRAX) 200 MG capsule Take 1 capsule (200 mg total) by mouth 2 (two) times daily. (Patient taking differently: Take 200 mg by mouth daily. ) 60 capsule 6  . amLODipine (NORVASC) 10 MG tablet Take 1 tablet (10 mg total) by mouth daily. 90 tablet 1  . aspirin EC 81 MG tablet Take by mouth.    . carvedilol (COREG) 12.5 MG tablet Take 1 tablet (12.5 mg total) by mouth 2 (two) times daily. 180 tablet 1  . Cholecalciferol (VITAMIN D-1000 MAX ST) 25 MCG (1000 UT) tablet Take 1,000 Units by mouth daily.     . citalopram (CELEXA) 40 MG tablet Take 1 tablet (40 mg total) by mouth daily. 90 tablet 1  . epoetin alfa-epbx (RETACRIT) 10932 UNIT/ML injection Inject into the skin.    . ferrous sulfate 325 (65 FE) MG tablet Take 1 tablet (325 mg total) by mouth daily with breakfast. 30 tablet 1  . furosemide (LASIX) 20 MG tablet Take 1 tablet (20 mg total) by mouth daily. 90 tablet 1  . gentamicin cream (GARAMYCIN) 0.1 % APP  CATHETER EXIT SITE D    . losartan (COZAAR) 50 MG tablet Take 1 tablet (50 mg total) by mouth daily. 90 tablet 1  . predniSONE (DELTASONE) 10 MG tablet Take 10 mg by mouth daily with breakfast.    . ruxolitinib phosphate (JAKAFI) 10 MG tablet Take 1 tablet (10 mg total) by mouth daily. 60 tablet 4  . traMADol (ULTRAM) 50 MG tablet  Take 1 tablet (50 mg total) by mouth every 8 (eight) hours as needed. 21 tablet 0   No current facility-administered medications for this visit.    PHYSICAL EXAMINATION: ECOG PERFORMANCE STATUS: 1 - Symptomatic but completely ambulatory  BP (!) 150/71 (BP Location: Left Arm, Patient Position: Sitting, Cuff Size: Normal)   Pulse 67   Temp 98 F (36.7 C) (Tympanic)   Wt 181 lb 4 oz (82.2 kg)   BMI 28.39 kg/m   Filed Weights   10/07/19 1437  Weight: 181 lb 4 oz (82.2 kg)    Physical Exam  Constitutional: He is oriented to person, place, and time and well-developed, well-nourished, and in no distress.  He is alone.   HENT:  Head: Normocephalic and atraumatic.  Mouth/Throat: Oropharynx is clear and moist.  No oropharyngeal exudate.  Eyes: Pupils are equal, round, and reactive to light.  Cardiovascular: Normal rate and regular rhythm.  Pulmonary/Chest: No respiratory distress. He has no wheezes.  Decreased air entry bilaterally.  Abdominal: Soft. Bowel sounds are normal. He exhibits no distension and no mass. There is no abdominal tenderness. There is no rebound and no guarding.  Musculoskeletal:        General: No tenderness or edema. Normal range of motion.     Cervical back: Normal range of motion and neck supple.  Neurological: He is alert and oriented to person, place, and time.  Skin: Skin is warm.  Psychiatric: Affect normal.    LABORATORY DATA:  I have reviewed the data as listed    Component Value Date/Time   NA 134 (L) 10/10/2019 1005   NA 137 03/14/2019 1000   K 5.3 (H) 10/10/2019 1005   CL 103 10/10/2019 1005   CO2 21 (L) 10/10/2019 1005   GLUCOSE 107 (H) 10/10/2019 1005   BUN 47 (H) 10/10/2019 1005   BUN 58 (H) 03/14/2019 1000   CREATININE 4.12 (H) 10/10/2019 1005   CREATININE 1.12 09/30/2013 0953   CALCIUM 9.0 10/10/2019 1005   PROT 6.1 03/14/2019 1000   ALBUMIN 4.6 03/14/2019 1000   AST 12 03/14/2019 1000   ALT 15 03/14/2019 1000   ALKPHOS 39  03/14/2019 1000   BILITOT 0.4 03/14/2019 1000   GFRNONAA 14 (L) 10/10/2019 1005   GFRNONAA >60 09/30/2013 0953   GFRAA 17 (L) 10/10/2019 1005   GFRAA >60 09/30/2013 0953    No results found for: SPEP, UPEP  Lab Results  Component Value Date   WBC 5.8 10/10/2019   NEUTROABS 5.0 10/10/2019   HGB 11.2 (L) 10/10/2019   HCT 36.1 (L) 10/10/2019   MCV 95.3 10/10/2019   PLT 105 (L) 10/10/2019      Chemistry      Component Value Date/Time   NA 134 (L) 10/10/2019 1005   NA 137 03/14/2019 1000   K 5.3 (H) 10/10/2019 1005   CL 103 10/10/2019 1005   CO2 21 (L) 10/10/2019 1005   BUN 47 (H) 10/10/2019 1005   BUN 58 (H) 03/14/2019 1000   CREATININE 4.12 (H) 10/10/2019 1005   CREATININE 1.12 09/30/2013 0953      Component Value Date/Time   CALCIUM 9.0 10/10/2019 1005   ALKPHOS 39 03/14/2019 1000   AST 12 03/14/2019 1000   ALT 15 03/14/2019 1000   BILITOT 0.4 03/14/2019 1000       RADIOGRAPHIC STUDIES: I have personally reviewed the radiological images as listed and agreed with the findings in the report. No results found.   ASSESSMENT & PLAN:  Primary myelofibrosis ([place]) # Primary Myelofiborisis [Bone marrow 2010] jak-2 positive;  Currently on jakafi since October 2nd 2019.[id number]-[id number]; overall clinically stable.  # July 2020- US spleen- 630 [similar to 580 cc in Feb 2019];Jakafi 10 mg once a day in AM while on  PD.  We will plan to get a ultrasound prior to next visit.  Ordered.    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  Hemoglobin 11.  Stable.  Monitor for now.  #CKD stage IV-V [Dr. Latif]  on PD; stable appt tomorrow.   #Antimicrobial prophylaxis-acyclovir 200 mg twice a day.  Stable  # I discussed regarding Covid-19 precautions.  I reviewed the vaccine effectiveness and potential side effects in detail.  Also discussed long-term effectiveness and safety profile are  unclear at this time.  I  discussed December, 2020 [facility] position statement-that all patients are recommended COVID-19 vaccinations [when available]-as long as they do not have allergy to components of the vaccine.  However, I think the benefits of the vaccination outweigh the potential risks. Re: GEFUW-72 vaccination.  For more information/scheduling recommend call [phone], 8:30am-4:30pm.   # DISPOSITION:labs copy # follow up in 2 months-labs- MD-cbc/bmp/LDH; Ultrasound Spleen-Dr.B    Cc; Dr.[name] /Dr.Johnson   Orders Placed This Encounter  Procedures  . US SPLEEN (ABDOMEN LIMITED)    Standing Status:   Future    Standing Expiration Date:   12/07/2020    Order Specific Question:   Reason for Exam (SYMPTOM  OR DIAGNOSIS REQUIRED)    Answer:   splenomegaly/myelofibrosis    Order Specific Question:   Preferred imaging location?    Answer:   [facility]  . CBC with Differential    Standing Status:   Future    Standing Expiration Date:   10/09/2020  . Basic metabolic panel    Standing Status:   Future    Standing Expiration Date:   10/09/2020  . Lactate dehydrogenase    Standing Status:   Future    Standing Expiration Date:   10/09/2020   All questions were answered. The patient knows to call the clinic with any problems, questions or concerns.     James Sickle, MD 10/10/2019 11:14 AM

## 2019-10-10 NOTE — Assessment & Plan Note (Addendum)
#   Primary Myelofiborisis [Bone marrow 2010] jak-2 positive;  Currently on jakafi since October 2nd 2019.[id number]; overall clinically stable.  # July 2020- US spleen- 630 [similar to 580 cc in Feb 2019];Jakafi 10 mg once a day in AM while on  PD.  We will plan to get a ultrasound prior to next visit.  Ordered.    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  Hemoglobin 11.  Stable.  Monitor for now.  #CKD stage IV-V [Dr. Latif]  on PD; stable appt tomorrow.   #Antimicrobial prophylaxis-acyclovir 200 mg twice a day.  Stable  # I discussed regarding Covid-19 precautions.  I reviewed the vaccine effectiveness and potential side effects in detail.  Also discussed long-term effectiveness and safety profile are unclear at this time.  I discussed December, 2020 [facility] position statement-that all patients are recommended COVID-19 vaccinations [when available]-as long as they do not have allergy to components of the vaccine.  However, I think the benefits of the vaccination outweigh the potential risks. Re: [id number]-28 vaccination.  For more information/scheduling recommend call [phone], 8:30am-4:30pm.   # DISPOSITION:labs copy # follow up in 2 months-labs- MD-cbc/bmp/LDH; Ultrasound Spleen-Dr.B    Cc; Dr.[name] /Dr.Johnson

## 2019-10-11 ENCOUNTER — Inpatient Hospital Stay: Payer: Medicare HMO | Admitting: Internal Medicine

## 2019-10-11 ENCOUNTER — Inpatient Hospital Stay: Payer: Medicare HMO

## 2019-10-11 DIAGNOSIS — Z992 Dependence on renal dialysis: Secondary | ICD-10-CM | POA: Diagnosis not present

## 2019-10-11 DIAGNOSIS — N186 End stage renal disease: Secondary | ICD-10-CM | POA: Diagnosis not present

## 2019-10-12 DIAGNOSIS — N186 End stage renal disease: Secondary | ICD-10-CM | POA: Diagnosis not present

## 2019-10-12 DIAGNOSIS — Z992 Dependence on renal dialysis: Secondary | ICD-10-CM | POA: Diagnosis not present

## 2019-10-13 DIAGNOSIS — Z992 Dependence on renal dialysis: Secondary | ICD-10-CM | POA: Diagnosis not present

## 2019-10-13 DIAGNOSIS — N186 End stage renal disease: Secondary | ICD-10-CM | POA: Diagnosis not present

## 2019-10-14 DIAGNOSIS — Z992 Dependence on renal dialysis: Secondary | ICD-10-CM | POA: Diagnosis not present

## 2019-10-14 DIAGNOSIS — N186 End stage renal disease: Secondary | ICD-10-CM | POA: Diagnosis not present

## 2019-10-16 DIAGNOSIS — N186 End stage renal disease: Secondary | ICD-10-CM | POA: Diagnosis not present

## 2019-10-16 DIAGNOSIS — Z992 Dependence on renal dialysis: Secondary | ICD-10-CM | POA: Diagnosis not present

## 2019-10-17 DIAGNOSIS — N186 End stage renal disease: Secondary | ICD-10-CM | POA: Diagnosis not present

## 2019-10-17 DIAGNOSIS — Z992 Dependence on renal dialysis: Secondary | ICD-10-CM | POA: Diagnosis not present

## 2019-10-18 DIAGNOSIS — Z992 Dependence on renal dialysis: Secondary | ICD-10-CM | POA: Diagnosis not present

## 2019-10-18 DIAGNOSIS — N186 End stage renal disease: Secondary | ICD-10-CM | POA: Diagnosis not present

## 2019-10-19 DIAGNOSIS — Z992 Dependence on renal dialysis: Secondary | ICD-10-CM | POA: Diagnosis not present

## 2019-10-19 DIAGNOSIS — N186 End stage renal disease: Secondary | ICD-10-CM | POA: Diagnosis not present

## 2019-10-20 DIAGNOSIS — Z992 Dependence on renal dialysis: Secondary | ICD-10-CM | POA: Diagnosis not present

## 2019-10-20 DIAGNOSIS — N186 End stage renal disease: Secondary | ICD-10-CM | POA: Diagnosis not present

## 2019-10-21 DIAGNOSIS — N186 End stage renal disease: Secondary | ICD-10-CM | POA: Diagnosis not present

## 2019-10-21 DIAGNOSIS — Z992 Dependence on renal dialysis: Secondary | ICD-10-CM | POA: Diagnosis not present

## 2019-10-23 DIAGNOSIS — N186 End stage renal disease: Secondary | ICD-10-CM | POA: Diagnosis not present

## 2019-10-23 DIAGNOSIS — Z992 Dependence on renal dialysis: Secondary | ICD-10-CM | POA: Diagnosis not present

## 2019-10-24 DIAGNOSIS — N186 End stage renal disease: Secondary | ICD-10-CM | POA: Diagnosis not present

## 2019-10-24 DIAGNOSIS — Z23 Encounter for immunization: Secondary | ICD-10-CM | POA: Diagnosis not present

## 2019-10-24 DIAGNOSIS — Z992 Dependence on renal dialysis: Secondary | ICD-10-CM | POA: Diagnosis not present

## 2019-10-25 DIAGNOSIS — Z992 Dependence on renal dialysis: Secondary | ICD-10-CM | POA: Diagnosis not present

## 2019-10-25 DIAGNOSIS — N186 End stage renal disease: Secondary | ICD-10-CM | POA: Diagnosis not present

## 2019-10-25 DIAGNOSIS — Z23 Encounter for immunization: Secondary | ICD-10-CM | POA: Diagnosis not present

## 2019-10-26 DIAGNOSIS — Z992 Dependence on renal dialysis: Secondary | ICD-10-CM | POA: Diagnosis not present

## 2019-10-26 DIAGNOSIS — N186 End stage renal disease: Secondary | ICD-10-CM | POA: Diagnosis not present

## 2019-10-26 DIAGNOSIS — Z23 Encounter for immunization: Secondary | ICD-10-CM | POA: Diagnosis not present

## 2019-10-26 MED FILL — JAKAFI 10 MG TABLET: 10 | 30 days supply | Qty: 30 | Fill #3

## 2019-10-27 ENCOUNTER — Telehealth: Payer: Self-pay | Admitting: Family Medicine

## 2019-10-27 DIAGNOSIS — Z992 Dependence on renal dialysis: Secondary | ICD-10-CM | POA: Diagnosis not present

## 2019-10-27 DIAGNOSIS — N186 End stage renal disease: Secondary | ICD-10-CM | POA: Diagnosis not present

## 2019-10-27 DIAGNOSIS — Z23 Encounter for immunization: Secondary | ICD-10-CM | POA: Diagnosis not present

## 2019-10-27 NOTE — Chronic Care Management (AMB) (Signed)
  Chronic Care Management   Note  10/27/2019 Name: James Arnold MRN: [id number] DOB: December 21, 1955  James Arnold is a 64 y.o. year old male who is a primary care patient of Leticia, Mcdiarmid, DO. I reached out to James Arnold by phone today in response to a referral sent by James Arnold's health plan.     James Arnold wife was given information about Chronic Care Management services today including:  1. CCM service includes personalized support from designated clinical staff supervised by his physician, including individualized plan of care and coordination with other care providers 2. 24/7 contact phone numbers for assistance for urgent and routine care needs. 3. Service will only be billed when office clinical staff spend 20 minutes or more in a month to coordinate care. 4. Only one practitioner may furnish and bill the service in a calendar month. 5. The patient may stop CCM services at any time (effective at the end of the month) by phone call to the office staff. 6. The patient will be responsible for cost sharing (co-pay) of up to 20% of the service fee (after annual deductible is met).  Patient's wife James Arnold agreed to services and verbal consent obtained.   Follow up plan: Telephone appointment with care management team member scheduled for:12/14/2019  Noreene Larsson, [address], [place], [place] [address] Direct Dial: [phone] [email or web address] Website: [email or web address]

## 2019-10-28 DIAGNOSIS — Z992 Dependence on renal dialysis: Secondary | ICD-10-CM | POA: Diagnosis not present

## 2019-10-28 DIAGNOSIS — N186 End stage renal disease: Secondary | ICD-10-CM | POA: Diagnosis not present

## 2019-10-28 DIAGNOSIS — Z23 Encounter for immunization: Secondary | ICD-10-CM | POA: Diagnosis not present

## 2019-10-30 DIAGNOSIS — N186 End stage renal disease: Secondary | ICD-10-CM | POA: Diagnosis not present

## 2019-10-30 DIAGNOSIS — Z23 Encounter for immunization: Secondary | ICD-10-CM | POA: Diagnosis not present

## 2019-10-30 DIAGNOSIS — Z992 Dependence on renal dialysis: Secondary | ICD-10-CM | POA: Diagnosis not present

## 2019-10-31 DIAGNOSIS — Z992 Dependence on renal dialysis: Secondary | ICD-10-CM | POA: Diagnosis not present

## 2019-10-31 DIAGNOSIS — Z23 Encounter for immunization: Secondary | ICD-10-CM | POA: Diagnosis not present

## 2019-10-31 DIAGNOSIS — N186 End stage renal disease: Secondary | ICD-10-CM | POA: Diagnosis not present

## 2019-11-01 DIAGNOSIS — Z23 Encounter for immunization: Secondary | ICD-10-CM | POA: Diagnosis not present

## 2019-11-01 DIAGNOSIS — Z992 Dependence on renal dialysis: Secondary | ICD-10-CM | POA: Diagnosis not present

## 2019-11-01 DIAGNOSIS — N186 End stage renal disease: Secondary | ICD-10-CM | POA: Diagnosis not present

## 2019-11-02 DIAGNOSIS — N186 End stage renal disease: Secondary | ICD-10-CM | POA: Diagnosis not present

## 2019-11-02 DIAGNOSIS — Z992 Dependence on renal dialysis: Secondary | ICD-10-CM | POA: Diagnosis not present

## 2019-11-02 DIAGNOSIS — Z23 Encounter for immunization: Secondary | ICD-10-CM | POA: Diagnosis not present

## 2019-11-03 DIAGNOSIS — Z992 Dependence on renal dialysis: Secondary | ICD-10-CM | POA: Diagnosis not present

## 2019-11-03 DIAGNOSIS — N186 End stage renal disease: Secondary | ICD-10-CM | POA: Diagnosis not present

## 2019-11-03 DIAGNOSIS — Z23 Encounter for immunization: Secondary | ICD-10-CM | POA: Diagnosis not present

## 2019-11-04 DIAGNOSIS — Z23 Encounter for immunization: Secondary | ICD-10-CM | POA: Diagnosis not present

## 2019-11-04 DIAGNOSIS — N186 End stage renal disease: Secondary | ICD-10-CM | POA: Diagnosis not present

## 2019-11-04 DIAGNOSIS — Z992 Dependence on renal dialysis: Secondary | ICD-10-CM | POA: Diagnosis not present

## 2019-11-06 DIAGNOSIS — N186 End stage renal disease: Secondary | ICD-10-CM | POA: Diagnosis not present

## 2019-11-06 DIAGNOSIS — Z992 Dependence on renal dialysis: Secondary | ICD-10-CM | POA: Diagnosis not present

## 2019-11-06 DIAGNOSIS — Z23 Encounter for immunization: Secondary | ICD-10-CM | POA: Diagnosis not present

## 2019-11-07 DIAGNOSIS — N186 End stage renal disease: Secondary | ICD-10-CM | POA: Diagnosis not present

## 2019-11-07 DIAGNOSIS — Z23 Encounter for immunization: Secondary | ICD-10-CM | POA: Diagnosis not present

## 2019-11-07 DIAGNOSIS — Z992 Dependence on renal dialysis: Secondary | ICD-10-CM | POA: Diagnosis not present

## 2019-11-08 DIAGNOSIS — Z23 Encounter for immunization: Secondary | ICD-10-CM | POA: Diagnosis not present

## 2019-11-08 DIAGNOSIS — N186 End stage renal disease: Secondary | ICD-10-CM | POA: Diagnosis not present

## 2019-11-08 DIAGNOSIS — Z992 Dependence on renal dialysis: Secondary | ICD-10-CM | POA: Diagnosis not present

## 2019-11-09 DIAGNOSIS — Z23 Encounter for immunization: Secondary | ICD-10-CM | POA: Diagnosis not present

## 2019-11-09 DIAGNOSIS — N186 End stage renal disease: Secondary | ICD-10-CM | POA: Diagnosis not present

## 2019-11-09 DIAGNOSIS — Z992 Dependence on renal dialysis: Secondary | ICD-10-CM | POA: Diagnosis not present

## 2019-11-10 DIAGNOSIS — N186 End stage renal disease: Secondary | ICD-10-CM | POA: Diagnosis not present

## 2019-11-10 DIAGNOSIS — Z992 Dependence on renal dialysis: Secondary | ICD-10-CM | POA: Diagnosis not present

## 2019-11-10 DIAGNOSIS — Z23 Encounter for immunization: Secondary | ICD-10-CM | POA: Diagnosis not present

## 2019-11-11 DIAGNOSIS — N186 End stage renal disease: Secondary | ICD-10-CM | POA: Diagnosis not present

## 2019-11-11 DIAGNOSIS — Z992 Dependence on renal dialysis: Secondary | ICD-10-CM | POA: Diagnosis not present

## 2019-11-11 DIAGNOSIS — Z23 Encounter for immunization: Secondary | ICD-10-CM | POA: Diagnosis not present

## 2019-11-13 DIAGNOSIS — N186 End stage renal disease: Secondary | ICD-10-CM | POA: Diagnosis not present

## 2019-11-13 DIAGNOSIS — Z23 Encounter for immunization: Secondary | ICD-10-CM | POA: Diagnosis not present

## 2019-11-13 DIAGNOSIS — Z992 Dependence on renal dialysis: Secondary | ICD-10-CM | POA: Diagnosis not present

## 2019-11-14 ENCOUNTER — Other Ambulatory Visit: Payer: Self-pay | Admitting: *Deleted

## 2019-11-14 DIAGNOSIS — Z992 Dependence on renal dialysis: Secondary | ICD-10-CM | POA: Diagnosis not present

## 2019-11-14 DIAGNOSIS — Z23 Encounter for immunization: Secondary | ICD-10-CM | POA: Diagnosis not present

## 2019-11-14 DIAGNOSIS — N186 End stage renal disease: Secondary | ICD-10-CM | POA: Diagnosis not present

## 2019-11-15 ENCOUNTER — Other Ambulatory Visit: Payer: Self-pay | Admitting: *Deleted

## 2019-11-15 DIAGNOSIS — Z23 Encounter for immunization: Secondary | ICD-10-CM | POA: Diagnosis not present

## 2019-11-15 DIAGNOSIS — N186 End stage renal disease: Secondary | ICD-10-CM | POA: Diagnosis not present

## 2019-11-15 DIAGNOSIS — Z992 Dependence on renal dialysis: Secondary | ICD-10-CM | POA: Diagnosis not present

## 2019-11-16 DIAGNOSIS — Z23 Encounter for immunization: Secondary | ICD-10-CM | POA: Diagnosis not present

## 2019-11-16 DIAGNOSIS — N186 End stage renal disease: Secondary | ICD-10-CM | POA: Diagnosis not present

## 2019-11-16 DIAGNOSIS — Z992 Dependence on renal dialysis: Secondary | ICD-10-CM | POA: Diagnosis not present

## 2019-11-16 NOTE — Telephone Encounter (Signed)
Dr. Jacinto Reap - in September, you had instructed that the patient contact his pcp to RF the tramadol. He was using the tramadol for shoulder pain. Please advise if RF is appropriate.

## 2019-11-17 DIAGNOSIS — N186 End stage renal disease: Secondary | ICD-10-CM | POA: Diagnosis not present

## 2019-11-17 DIAGNOSIS — Z23 Encounter for immunization: Secondary | ICD-10-CM | POA: Diagnosis not present

## 2019-11-17 DIAGNOSIS — Z992 Dependence on renal dialysis: Secondary | ICD-10-CM | POA: Diagnosis not present

## 2019-11-18 DIAGNOSIS — Z992 Dependence on renal dialysis: Secondary | ICD-10-CM | POA: Diagnosis not present

## 2019-11-18 DIAGNOSIS — Z23 Encounter for immunization: Secondary | ICD-10-CM | POA: Diagnosis not present

## 2019-11-18 DIAGNOSIS — N186 End stage renal disease: Secondary | ICD-10-CM | POA: Diagnosis not present

## 2019-11-20 DIAGNOSIS — Z23 Encounter for immunization: Secondary | ICD-10-CM | POA: Diagnosis not present

## 2019-11-20 DIAGNOSIS — N186 End stage renal disease: Secondary | ICD-10-CM | POA: Diagnosis not present

## 2019-11-20 DIAGNOSIS — Z992 Dependence on renal dialysis: Secondary | ICD-10-CM | POA: Diagnosis not present

## 2019-11-21 DIAGNOSIS — N186 End stage renal disease: Secondary | ICD-10-CM | POA: Diagnosis not present

## 2019-11-21 DIAGNOSIS — Z992 Dependence on renal dialysis: Secondary | ICD-10-CM | POA: Diagnosis not present

## 2019-11-22 DIAGNOSIS — Z992 Dependence on renal dialysis: Secondary | ICD-10-CM | POA: Diagnosis not present

## 2019-11-22 DIAGNOSIS — N186 End stage renal disease: Secondary | ICD-10-CM | POA: Diagnosis not present

## 2019-11-23 DIAGNOSIS — Z992 Dependence on renal dialysis: Secondary | ICD-10-CM | POA: Diagnosis not present

## 2019-11-23 DIAGNOSIS — N186 End stage renal disease: Secondary | ICD-10-CM | POA: Diagnosis not present

## 2019-11-24 DIAGNOSIS — N186 End stage renal disease: Secondary | ICD-10-CM | POA: Diagnosis not present

## 2019-11-24 DIAGNOSIS — Z992 Dependence on renal dialysis: Secondary | ICD-10-CM | POA: Diagnosis not present

## 2019-11-25 DIAGNOSIS — Z992 Dependence on renal dialysis: Secondary | ICD-10-CM | POA: Diagnosis not present

## 2019-11-25 DIAGNOSIS — N186 End stage renal disease: Secondary | ICD-10-CM | POA: Diagnosis not present

## 2019-11-27 DIAGNOSIS — N186 End stage renal disease: Secondary | ICD-10-CM | POA: Diagnosis not present

## 2019-11-27 DIAGNOSIS — Z992 Dependence on renal dialysis: Secondary | ICD-10-CM | POA: Diagnosis not present

## 2019-11-28 DIAGNOSIS — Z992 Dependence on renal dialysis: Secondary | ICD-10-CM | POA: Diagnosis not present

## 2019-11-28 DIAGNOSIS — N186 End stage renal disease: Secondary | ICD-10-CM | POA: Diagnosis not present

## 2019-11-29 DIAGNOSIS — Z992 Dependence on renal dialysis: Secondary | ICD-10-CM | POA: Diagnosis not present

## 2019-11-29 DIAGNOSIS — N186 End stage renal disease: Secondary | ICD-10-CM | POA: Diagnosis not present

## 2019-11-30 ENCOUNTER — Other Ambulatory Visit: Payer: Self-pay

## 2019-11-30 ENCOUNTER — Ambulatory Visit
Admission: RE | Admit: 2019-11-30 | Discharge: 2019-11-30 | Disposition: A | Discharge status: Home / Self Care | Payer: Medicare HMO | Source: Ambulatory Visit | Attending: Internal Medicine | Admitting: Internal Medicine

## 2019-11-30 DIAGNOSIS — Z992 Dependence on renal dialysis: Secondary | ICD-10-CM | POA: Diagnosis not present

## 2019-11-30 DIAGNOSIS — D471 Chronic myeloproliferative disease: Secondary | ICD-10-CM

## 2019-11-30 DIAGNOSIS — R161 Splenomegaly, not elsewhere classified: Secondary | ICD-10-CM | POA: Diagnosis not present

## 2019-11-30 DIAGNOSIS — N186 End stage renal disease: Secondary | ICD-10-CM | POA: Diagnosis not present

## 2019-12-01 DIAGNOSIS — Z992 Dependence on renal dialysis: Secondary | ICD-10-CM | POA: Diagnosis not present

## 2019-12-01 DIAGNOSIS — N186 End stage renal disease: Secondary | ICD-10-CM | POA: Diagnosis not present

## 2019-12-01 MED FILL — JAKAFI 10 MG TABLET: 10 | 30 days supply | Qty: 30 | Fill #4

## 2019-12-02 ENCOUNTER — Encounter: Payer: Self-pay | Admitting: Internal Medicine

## 2019-12-02 ENCOUNTER — Other Ambulatory Visit: Payer: Self-pay

## 2019-12-02 DIAGNOSIS — Z992 Dependence on renal dialysis: Secondary | ICD-10-CM | POA: Diagnosis not present

## 2019-12-02 DIAGNOSIS — N186 End stage renal disease: Secondary | ICD-10-CM | POA: Diagnosis not present

## 2019-12-02 NOTE — Progress Notes (Signed)
Patient reports new onset of bruising on his arm. No history of falls. Patient reports fatigue. Patient c/o intermittent left lower quadrant abdominal pain x 1-2 months. Pt c/o intermittent headache which is relieved by tylenol. Patient completely out of tramadol - pcp does not want to prescribe.

## 2019-12-04 DIAGNOSIS — Z992 Dependence on renal dialysis: Secondary | ICD-10-CM | POA: Diagnosis not present

## 2019-12-04 DIAGNOSIS — N186 End stage renal disease: Secondary | ICD-10-CM | POA: Diagnosis not present

## 2019-12-05 ENCOUNTER — Inpatient Hospital Stay (HOSPITAL_BASED_OUTPATIENT_CLINIC_OR_DEPARTMENT_OTHER): Payer: Medicare HMO | Admitting: Internal Medicine

## 2019-12-05 ENCOUNTER — Encounter: Payer: Self-pay | Admitting: Internal Medicine

## 2019-12-05 ENCOUNTER — Telehealth: Payer: Self-pay | Admitting: Internal Medicine

## 2019-12-05 ENCOUNTER — Inpatient Hospital Stay: Payer: Medicare HMO | Attending: Internal Medicine

## 2019-12-05 DIAGNOSIS — E785 Hyperlipidemia, unspecified: Secondary | ICD-10-CM | POA: Diagnosis not present

## 2019-12-05 DIAGNOSIS — I120 Hypertensive chronic kidney disease with stage 5 chronic kidney disease or end stage renal disease: Secondary | ICD-10-CM | POA: Insufficient documentation

## 2019-12-05 DIAGNOSIS — D471 Chronic myeloproliferative disease: Secondary | ICD-10-CM

## 2019-12-05 DIAGNOSIS — N186 End stage renal disease: Secondary | ICD-10-CM | POA: Diagnosis not present

## 2019-12-05 DIAGNOSIS — Z79899 Other long term (current) drug therapy: Secondary | ICD-10-CM | POA: Diagnosis not present

## 2019-12-05 DIAGNOSIS — N185 Chronic kidney disease, stage 5: Secondary | ICD-10-CM | POA: Diagnosis not present

## 2019-12-05 DIAGNOSIS — I2510 Atherosclerotic heart disease of native coronary artery without angina pectoris: Secondary | ICD-10-CM | POA: Diagnosis not present

## 2019-12-05 DIAGNOSIS — F17210 Nicotine dependence, cigarettes, uncomplicated: Secondary | ICD-10-CM | POA: Insufficient documentation

## 2019-12-05 DIAGNOSIS — Z992 Dependence on renal dialysis: Secondary | ICD-10-CM | POA: Insufficient documentation

## 2019-12-05 DIAGNOSIS — F329 Major depressive disorder, single episode, unspecified: Secondary | ICD-10-CM | POA: Insufficient documentation

## 2019-12-05 DIAGNOSIS — R5381 Other malaise: Secondary | ICD-10-CM | POA: Diagnosis not present

## 2019-12-05 DIAGNOSIS — R233 Spontaneous ecchymoses: Secondary | ICD-10-CM | POA: Diagnosis not present

## 2019-12-05 DIAGNOSIS — Z7982 Long term (current) use of aspirin: Secondary | ICD-10-CM | POA: Diagnosis not present

## 2019-12-05 DIAGNOSIS — R5383 Other fatigue: Secondary | ICD-10-CM | POA: Insufficient documentation

## 2019-12-05 DIAGNOSIS — R161 Splenomegaly, not elsewhere classified: Secondary | ICD-10-CM | POA: Insufficient documentation

## 2019-12-05 DIAGNOSIS — Z7952 Long term (current) use of systemic steroids: Secondary | ICD-10-CM | POA: Diagnosis not present

## 2019-12-05 LAB — BASIC METABOLIC PANEL
Anion gap: 7 (ref 5–15)
BUN: 48 mg/dL — ABNORMAL HIGH (ref 8–23)
CO2: 22 mmol/L (ref 22–32)
Calcium: 8.2 mg/dL — ABNORMAL LOW (ref 8.9–10.3)
Chloride: 107 mmol/L (ref 98–111)
Creatinine, Ser: 4.17 mg/dL — ABNORMAL HIGH (ref 0.61–1.24)
GFR calc Af Amer: 16 mL/min — ABNORMAL LOW (ref >60)
GFR calc non Af Amer: 14 mL/min — ABNORMAL LOW (ref >60)
Glucose, Bld: 117 mg/dL — ABNORMAL HIGH (ref 70–99)
Potassium: 4.3 mmol/L (ref 3.5–5.1)
Sodium: 136 mmol/L (ref 135–145)

## 2019-12-05 LAB — CBC WITH DIFFERENTIAL/PLATELET
Abs Immature Granulocytes: 0.02 10*3/uL (ref 0.00–0.07)
Basophils Absolute: 0 10*3/uL (ref 0.0–0.1)
Basophils Relative: 0 %
Eosinophils Absolute: 0 10*3/uL (ref 0.0–0.5)
Eosinophils Relative: 0 %
HCT: 34.4 % — ABNORMAL LOW (ref 39.0–52.0)
Hemoglobin: 11.2 g/dL — ABNORMAL LOW (ref 13.0–17.0)
Immature Granulocytes: 0 %
Lymphocytes Relative: 9 %
Lymphs Abs: 0.4 10*3/uL — ABNORMAL LOW (ref 0.7–4.0)
MCH: 30 pg (ref 26.0–34.0)
MCHC: 32.6 g/dL (ref 30.0–36.0)
MCV: 92.2 fL (ref 80.0–100.0)
Monocytes Absolute: 0.1 10*3/uL (ref 0.1–1.0)
Monocytes Relative: 2 %
Neutro Abs: 4.2 10*3/uL (ref 1.7–7.7)
Neutrophils Relative %: 89 %
Platelets: 93 10*3/uL — ABNORMAL LOW (ref 150–400)
RBC: 3.73 MIL/uL — ABNORMAL LOW (ref 4.22–5.81)
RDW: 16.9 % — ABNORMAL HIGH (ref 11.5–15.5)
WBC: 4.7 10*3/uL (ref 4.0–10.5)
nRBC: 0.6 % — ABNORMAL HIGH (ref 0.0–0.2)

## 2019-12-05 LAB — LACTATE DEHYDROGENASE: LDH: 535 U/L — ABNORMAL HIGH (ref 98–192)

## 2019-12-05 NOTE — Assessment & Plan Note (Addendum)
#   Primary Myelofiborisis [Bone marrow 2010] jak-2 positive;  Currently on jakafi since October 2nd 2019.[id number]; overall clinically  STABLE;   # march 2021- [place]- 790cc; July 3rd 2020- US spleen- 630 [baseline Feb 2019- 980cc];Jakafi 10 mg once a day in AM while on  PD.  Discussed that splenomegaly mildly increased compared to July last year.  However limited treatment options for the patient.  We will currently continue Jakafi 10 mg a day.  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  Hemoglobin 11. STABLE.   #CKD stage IV-V [Dr. Latif]  on PD; STABLE-  #Antimicrobial prophylaxis-acyclovir 200 mg twice a day. STABLE.   # Easy burisng- likley sec to prednisone; Check PT/PTT   # DISPOSITION:labs copy # follow up in 2 months-labs- MD-CBC/CML;PT/PTT/LDH; -Dr.B    Cc; Dr.[name] /Dr.Johnson

## 2019-12-05 NOTE — Progress Notes (Signed)
[facility] OFFICE PROGRESS NOTE  Patient Care Team: Valerie Roys, DO as PCP - General (Family Medicine) End, Harrell Gave, MD as PCP - Cardiology (Cardiology) Cammie Sickle, MD as Consulting Physician (Internal Medicine) Vanita Ingles, RN as Registered Nurse (General Practice)  Cancer Staging No matching staging information was found for the patient.   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   # AUGUST 1st week 2019- Retacrit   # October 2nd 2019- jakafi 10 BID [? 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 [name]- kidney Bx- FSG [ on Prednisone Dr.[name]]; July 2020-peritoneal dialysis;   # hx of Lung nodules- resolved [[name]]/quit smoking.  DIAGNOSIS: PRIMARY MYELOFIBROSIS  RISK:LOW     ;GOALS: Control  CURRENT/MOST RECENT THERAPY : Jakafi     Primary myelofibrosis ([place])      INTERVAL HISTORY:  James Arnold 64 y.o.  male pleasant patient above history of primary myelofibrosis Jak 2+ currently on Jakafi 10 milligrams once a day [name] dosed/peritoneal dialysis] and also chronic kidney disease/FSG on prednisone is here for follow-up.   Patient continues to complain of fatigue.  However this is not getting any worse.  Patient also complains of easy bruising.  Denies any gum bleeding or nosebleeds.  Is currently on prednisone 10 mg a day.  Patient continues to be on peritoneal dialysis.  No complications noted.  Denies any worsening pain in the abdomen or weight loss.  Denies any early satiety.  Review of Systems  Constitutional: Positive for malaise/fatigue.  Negative for chills, diaphoresis, fever and weight loss.  HENT: Negative for nosebleeds and sore throat.   Eyes: Negative for double vision.  Respiratory: Positive for shortness of breath. Negative for cough, hemoptysis, sputum production and wheezing.   Cardiovascular: Negative for chest pain, palpitations and orthopnea.  Gastrointestinal: Negative for abdominal pain, blood in stool, constipation, diarrhea, heartburn, melena, nausea and vomiting.  Genitourinary: Negative for dysuria, frequency and urgency.  Musculoskeletal: Negative for back pain and joint pain.  Skin: Negative.  Negative for itching and rash.  Neurological: Negative for dizziness, tingling, focal weakness, weakness and headaches.  Endo/Heme/Allergies: Does not bruise/bleed easily.  Psychiatric/Behavioral: Negative for depression. The patient is not nervous/anxious and does not have insomnia.       PAST MEDICAL HISTORY :  Past Medical History:  Diagnosis Date  . Acute renal failure (ARF) ([place]) 05/15/2017  . Anemia   . Benign hypertensive renal disease   . CAD (coronary artery disease)    a. 08/1996 s/p BMS to the mLAD ([facility]); b. 12/2017 MV: EF 46%, fixed apical defect w/ significant GI uptake artifact. No ischemia-> low risk.  . CKD (chronic kidney disease), stage IV ([place])   . Depression   . Diastolic dysfunction    a. 12/2017 Echo: EF 60-65%, no rwma, Gr1 DD, mild AI/MR. Nl RVSP.  [name] Kitchen Dyspnea    with exertion  . FSGS (focal segmental glomerulosclerosis)   . Hyperlipidemia 10/2002  . Hypertension   . Myelofibrosis ([place]) 2010  . Smoker   . Thrombocytopenia ([place])     PAST SURGICAL HISTORY :   Past Surgical History:  Procedure Laterality Date  . ANGIOPLASTY    .  BONE MARROW ASPIRATION  03/2009  . CARDIAC CATHETERIZATION    . COLONOSCOPY WITH PROPOFOL N/A 08/09/2018   Procedure: COLONOSCOPY WITH PROPOFOL;  Surgeon: [name], MD;  Location: [facility] ENDOSCOPY;  Service: Gastroenterology;  Laterality: N/A;  . CORONARY  ARTERY BYPASS GRAFT    . CORONARY STENT PLACEMENT  08/1996    FAMILY HISTORY :   Family History  Problem Relation Age of Onset  . Stroke Mother        Low BP stroke  . Depression Father   . Depression Sister        Breast  . Heart disease Other   . Breast cancer Other   . Lung cancer Other   . Ovarian cancer Other   . Stomach cancer Other     SOCIAL HISTORY:   Social History   Tobacco Use  . Smoking status: Current Every Day Smoker    Packs/day: 1.00    Types: Cigarettes    Last attempt to quit: 01/11/2018    Years since quitting: 1.8  . Smokeless tobacco: Never Used  Substance Use Topics  . Alcohol use: No    Alcohol/week: 0.0 standard drinks    Comment: patient states he has not consumed alcohol in the last month  . Drug use: No    ALLERGIES:  has No Known Allergies.  MEDICATIONS:  Current Outpatient Medications  Medication Sig Dispense Refill  . acetaminophen (TYLENOL) 500 MG tablet Take 1,000 mg by mouth every 6 (six) hours as needed.    [name] Kitchen acyclovir (ZOVIRAX) 200 MG capsule Take 1 capsule (200 mg total) by mouth 2 (two) times daily. (Patient taking differently: Take 200 mg by mouth daily. ) 60 capsule 6  . amLODipine (NORVASC) 10 MG tablet Take 1 tablet (10 mg total) by mouth daily. 90 tablet 1  . aspirin EC 81 MG tablet Take by mouth.    . carvedilol (COREG) 12.5 MG tablet Take 1 tablet (12.5 mg total) by mouth 2 (two) times daily. 180 tablet 1  . Cholecalciferol (VITAMIN D-1000 MAX ST) 25 MCG (1000 UT) tablet Take 1,000 Units by mouth daily.     . citalopram (CELEXA) 40 MG tablet Take 1 tablet (40 mg total) by mouth daily. 90 tablet 1  . epoetin alfa-epbx (RETACRIT) 42595 UNIT/ML injection Inject into the skin.    . ferrous sulfate 325 (65 FE) MG tablet Take 1 tablet (325 mg total) by mouth daily with breakfast. 30 tablet 1  . furosemide (LASIX) 20 MG tablet Take 1 tablet (20 mg total) by mouth daily. 90 tablet 1  . gentamicin cream (GARAMYCIN) 0.1 % APP   CATHETER EXIT SITE D    . losartan (COZAAR) 50 MG tablet Take 1 tablet (50 mg total) by mouth daily. 90 tablet 1  . predniSONE (DELTASONE) 10 MG tablet Take 10 mg by mouth daily with breakfast.    . ruxolitinib phosphate (JAKAFI) 10 MG tablet Take 1 tablet (10 mg total) by mouth daily. 60 tablet 4  . traMADol (ULTRAM) 50 MG tablet Take 1 tablet (50 mg total) by mouth every 8 (eight) hours as needed. (Patient not taking: Reported on 12/02/2019) 21 tablet 0   No current facility-administered medications for this visit.    PHYSICAL EXAMINATION: ECOG PERFORMANCE STATUS: 1 - Symptomatic but completely ambulatory  BP 115/74 (BP Location: Left Arm, Patient Position: Sitting, Cuff Size: Large)   Pulse 72   Temp (!) 97 F (36.1 C) (Tympanic)   Wt 187 lb 6 oz (85  kg)   BMI 29.35 kg/m   Filed Weights   12/05/19 0954  Weight: 187 lb 6 oz (85 kg)    Physical Exam  Constitutional: He is oriented to person, place, and time and well-developed, well-nourished, and in no distress.  He is alone.   HENT:  Head: Normocephalic and atraumatic.  Mouth/Throat: Oropharynx is clear and moist. No oropharyngeal exudate.  Eyes: Pupils are equal, round, and reactive to light.  Cardiovascular: Normal rate and regular rhythm.  Pulmonary/Chest: No respiratory distress. He has no wheezes.  Decreased air entry bilaterally.  Abdominal: Soft. Bowel sounds are normal. He exhibits no distension and no mass. There is no abdominal tenderness. There is no rebound and no guarding.  Musculoskeletal:        General: No tenderness or edema. Normal range of motion.     Cervical back: Normal range of motion and neck supple.  Neurological: He is alert and oriented to person, place, and time.  Skin: Skin is warm.  Psychiatric: Affect normal.    LABORATORY DATA:  I have reviewed the data as listed    Component Value Date/Time   NA 136 12/05/2019 0927   NA 137 03/14/2019 1000   K 4.3 12/05/2019 0927   CL 107 12/05/2019  0927   CO2 22 12/05/2019 0927   GLUCOSE 117 (H) 12/05/2019 0927   BUN 48 (H) 12/05/2019 0927   BUN 58 (H) 03/14/2019 1000   CREATININE 4.17 (H) 12/05/2019 0927   CREATININE 1.12 09/30/2013 0953   CALCIUM 8.2 (L) 12/05/2019 0927   PROT 6.1 03/14/2019 1000   ALBUMIN 4.6 03/14/2019 1000   AST 12 03/14/2019 1000   ALT 15 03/14/2019 1000   ALKPHOS 39 03/14/2019 1000   BILITOT 0.4 03/14/2019 1000   GFRNONAA 14 (L) 12/05/2019 0927   GFRNONAA >60 09/30/2013 0953   GFRAA 16 (L) 12/05/2019 0927   GFRAA >60 09/30/2013 0953    No results found for: SPEP, UPEP  Lab Results  Component Value Date   WBC 4.7 12/05/2019   NEUTROABS 4.2 12/05/2019   HGB 11.2 (L) 12/05/2019   HCT 34.4 (L) 12/05/2019   MCV 92.2 12/05/2019   PLT 93 (L) 12/05/2019      Chemistry      Component Value Date/Time   NA 136 12/05/2019 0927   NA 137 03/14/2019 1000   K 4.3 12/05/2019 0927   CL 107 12/05/2019 0927   CO2 22 12/05/2019 0927   BUN 48 (H) 12/05/2019 0927   BUN 58 (H) 03/14/2019 1000   CREATININE 4.17 (H) 12/05/2019 0927   CREATININE 1.12 09/30/2013 0953      Component Value Date/Time   CALCIUM 8.2 (L) 12/05/2019 0927   ALKPHOS 39 03/14/2019 1000   AST 12 03/14/2019 1000   ALT 15 03/14/2019 1000   BILITOT 0.4 03/14/2019 1000       RADIOGRAPHIC STUDIES: I have personally reviewed the radiological images as listed and agreed with the findings in the report. No results found.   ASSESSMENT & PLAN:  Primary myelofibrosis ([place]) # Primary Myelofiborisis [Bone marrow 2010] jak-2 positive;  Currently on jakafi since October 2nd 2019.[id number]; overall clinically  STABLE;   # march 2021- [place]- 790cc; July 3rd 2020- US spleen- 630 [baseline Feb 2019- 980cc];Jakafi 10 mg once a day in AM while on  PD.  Discussed that splenomegaly mildly increased compared to July last year.  However limited treatment options for the patient.  We will currently continue Jakafi 10 mg a  day.  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  Hemoglobin 11. STABLE.   #CKD stage IV-V [Dr. Latif]  on PD; STABLE-  #Antimicrobial prophylaxis-acyclovir 200 mg twice a day. STABLE.   # Easy burisng- likley sec to prednisone; Check PT/PTT   # DISPOSITION:labs copy # follow up in 2 months-labs- MD-CBC/CML;PT/PTT/LDH; -Dr.B    Cc; Dr.[name] /Dr.Johnson   Orders Placed This Encounter  Procedures  . CBC with Differential    Standing Status:   Future    Standing Expiration Date:   12/04/2020  . Comprehensive metabolic panel    Standing Status:   Future    Standing Expiration Date:   12/04/2020  . Lactate dehydrogenase    Standing Status:   Future    Standing Expiration Date:   12/04/2020  . Protime-INR    Standing Status:   Future    Standing Expiration Date:   12/04/2020  . APTT    Standing Status:   Future    Standing Expiration Date:   12/04/2020   All questions were answered. The patient knows to call the clinic with any problems, questions or concerns.     Cammie Sickle, MD 12/05/2019 10:42 AM

## 2019-12-05 NOTE — Telephone Encounter (Signed)
Spoke to wife- Jackelyn Poling re: results of the [place]- slightly larger than July 2020; given lack of any other good alternative- continue jakafi for now. Wife in agreement.

## 2019-12-06 DIAGNOSIS — Z992 Dependence on renal dialysis: Secondary | ICD-10-CM | POA: Diagnosis not present

## 2019-12-06 DIAGNOSIS — N186 End stage renal disease: Secondary | ICD-10-CM | POA: Diagnosis not present

## 2019-12-07 DIAGNOSIS — N186 End stage renal disease: Secondary | ICD-10-CM | POA: Diagnosis not present

## 2019-12-07 DIAGNOSIS — Z992 Dependence on renal dialysis: Secondary | ICD-10-CM | POA: Diagnosis not present

## 2019-12-08 DIAGNOSIS — Z992 Dependence on renal dialysis: Secondary | ICD-10-CM | POA: Diagnosis not present

## 2019-12-08 DIAGNOSIS — N186 End stage renal disease: Secondary | ICD-10-CM | POA: Diagnosis not present

## 2019-12-09 DIAGNOSIS — Z992 Dependence on renal dialysis: Secondary | ICD-10-CM | POA: Diagnosis not present

## 2019-12-09 DIAGNOSIS — N186 End stage renal disease: Secondary | ICD-10-CM | POA: Diagnosis not present

## 2019-12-11 DIAGNOSIS — N186 End stage renal disease: Secondary | ICD-10-CM | POA: Diagnosis not present

## 2019-12-11 DIAGNOSIS — Z992 Dependence on renal dialysis: Secondary | ICD-10-CM | POA: Diagnosis not present

## 2019-12-12 DIAGNOSIS — N186 End stage renal disease: Secondary | ICD-10-CM | POA: Diagnosis not present

## 2019-12-12 DIAGNOSIS — Z992 Dependence on renal dialysis: Secondary | ICD-10-CM | POA: Diagnosis not present

## 2019-12-13 DIAGNOSIS — N186 End stage renal disease: Secondary | ICD-10-CM | POA: Diagnosis not present

## 2019-12-13 DIAGNOSIS — Z992 Dependence on renal dialysis: Secondary | ICD-10-CM | POA: Diagnosis not present

## 2019-12-14 ENCOUNTER — Telehealth: Payer: Medicare HMO

## 2019-12-14 ENCOUNTER — Ambulatory Visit: Payer: Self-pay | Admitting: General Practice

## 2019-12-14 DIAGNOSIS — Z992 Dependence on renal dialysis: Secondary | ICD-10-CM | POA: Diagnosis not present

## 2019-12-14 DIAGNOSIS — N186 End stage renal disease: Secondary | ICD-10-CM | POA: Diagnosis not present

## 2019-12-14 NOTE — Chronic Care Management (AMB) (Signed)
°  Chronic Care Management   Outreach Note  12/14/2019 Name: BANDON SHERWIN MRN: [id number] DOB: [birth year]  Referred by: Valerie Roys, DO Reason for referral : Chronic Care Management ([facility] Outreach-1st attempt)   An unsuccessful telephone outreach was attempted today. The patient was referred to the case management team for assistance with care management and care coordination. Spoke to the patients wife briefly who ask for a call back any day in the afternoon.   Follow Up Plan: The care management team will reach out to the patient again over the next 7 to 14 days.   Noreene Larsson RN, MSN, [facility] Mobile: [phone]

## 2019-12-15 DIAGNOSIS — N186 End stage renal disease: Secondary | ICD-10-CM | POA: Diagnosis not present

## 2019-12-15 DIAGNOSIS — Z992 Dependence on renal dialysis: Secondary | ICD-10-CM | POA: Diagnosis not present

## 2019-12-16 DIAGNOSIS — Z992 Dependence on renal dialysis: Secondary | ICD-10-CM | POA: Diagnosis not present

## 2019-12-16 DIAGNOSIS — N186 End stage renal disease: Secondary | ICD-10-CM | POA: Diagnosis not present

## 2019-12-18 DIAGNOSIS — Z992 Dependence on renal dialysis: Secondary | ICD-10-CM | POA: Diagnosis not present

## 2019-12-18 DIAGNOSIS — N186 End stage renal disease: Secondary | ICD-10-CM | POA: Diagnosis not present

## 2019-12-19 DIAGNOSIS — N186 End stage renal disease: Secondary | ICD-10-CM | POA: Diagnosis not present

## 2019-12-19 DIAGNOSIS — Z992 Dependence on renal dialysis: Secondary | ICD-10-CM | POA: Diagnosis not present

## 2019-12-20 DIAGNOSIS — N186 End stage renal disease: Secondary | ICD-10-CM | POA: Diagnosis not present

## 2019-12-20 DIAGNOSIS — Z992 Dependence on renal dialysis: Secondary | ICD-10-CM | POA: Diagnosis not present

## 2019-12-21 DIAGNOSIS — Z992 Dependence on renal dialysis: Secondary | ICD-10-CM | POA: Diagnosis not present

## 2019-12-21 DIAGNOSIS — N186 End stage renal disease: Secondary | ICD-10-CM | POA: Diagnosis not present

## 2019-12-22 ENCOUNTER — Other Ambulatory Visit: Payer: Self-pay | Admitting: *Deleted

## 2019-12-22 ENCOUNTER — Other Ambulatory Visit: Payer: Self-pay

## 2019-12-22 DIAGNOSIS — I129 Hypertensive chronic kidney disease with stage 1 through stage 4 chronic kidney disease, or unspecified chronic kidney disease: Secondary | ICD-10-CM

## 2019-12-22 DIAGNOSIS — N186 End stage renal disease: Secondary | ICD-10-CM | POA: Diagnosis not present

## 2019-12-22 DIAGNOSIS — Z992 Dependence on renal dialysis: Secondary | ICD-10-CM | POA: Diagnosis not present

## 2019-12-22 MED ORDER — ACYCLOVIR 200 MG PO CAPS: 200.0000 mg | ORAL_CAPSULE | Freq: Two times a day (BID) | ORAL | 6 refills | Status: DC

## 2019-12-22 NOTE — Telephone Encounter (Signed)
New Rx request for medications to be filled at [facility].   LOV: 09/12/2020

## 2019-12-23 DIAGNOSIS — Z992 Dependence on renal dialysis: Secondary | ICD-10-CM | POA: Diagnosis not present

## 2019-12-23 DIAGNOSIS — N186 End stage renal disease: Secondary | ICD-10-CM | POA: Diagnosis not present

## 2019-12-23 NOTE — Telephone Encounter (Signed)
appt

## 2019-12-25 DIAGNOSIS — Z992 Dependence on renal dialysis: Secondary | ICD-10-CM | POA: Diagnosis not present

## 2019-12-25 DIAGNOSIS — N186 End stage renal disease: Secondary | ICD-10-CM | POA: Diagnosis not present

## 2019-12-26 ENCOUNTER — Telehealth: Payer: Self-pay

## 2019-12-26 DIAGNOSIS — N186 End stage renal disease: Secondary | ICD-10-CM | POA: Diagnosis not present

## 2019-12-26 DIAGNOSIS — Z992 Dependence on renal dialysis: Secondary | ICD-10-CM | POA: Diagnosis not present

## 2019-12-26 NOTE — Telephone Encounter (Signed)
Pt scheduled for 04/19.

## 2019-12-27 ENCOUNTER — Ambulatory Visit: Payer: Self-pay | Admitting: General Practice

## 2019-12-27 ENCOUNTER — Telehealth: Payer: Medicare HMO

## 2019-12-27 DIAGNOSIS — N186 End stage renal disease: Secondary | ICD-10-CM | POA: Diagnosis not present

## 2019-12-27 DIAGNOSIS — Z992 Dependence on renal dialysis: Secondary | ICD-10-CM | POA: Diagnosis not present

## 2019-12-27 DIAGNOSIS — I259 Chronic ischemic heart disease, unspecified: Secondary | ICD-10-CM | POA: Diagnosis not present

## 2019-12-27 NOTE — Chronic Care Management (AMB) (Signed)
  Chronic Care Management   Outreach Note  12/27/2019 Name: James Arnold MRN: [id number] DOB: 1955-12-31  Referred by: Valerie Roys, DO Reason for referral : Chronic Care Management ([facility] 2nd attempt)   A second unsuccessful telephone outreach was attempted today. The patient was referred to the case management team for assistance with care management and care coordination. The patients wife wanted to know the nature of the call. Explained the reason for the call and the wife states the patient is doing well and does not feel services are needed at this time. Will call in the future for any concerns.  Follow Up Plan: The care management team is available to follow up with the patient after provider conversation with the patient regarding recommendation for care management engagement and subsequent re-referral to the care management team.   Noreene Larsson RN, MSN, [facility] Mobile: [phone]

## 2019-12-28 DIAGNOSIS — Z992 Dependence on renal dialysis: Secondary | ICD-10-CM | POA: Diagnosis not present

## 2019-12-28 DIAGNOSIS — N186 End stage renal disease: Secondary | ICD-10-CM | POA: Diagnosis not present

## 2019-12-29 DIAGNOSIS — Z992 Dependence on renal dialysis: Secondary | ICD-10-CM | POA: Diagnosis not present

## 2019-12-29 DIAGNOSIS — N186 End stage renal disease: Secondary | ICD-10-CM | POA: Diagnosis not present

## 2019-12-29 MED FILL — JAKAFI 10 MG TABLET: 10 | 30 days supply | Qty: 30 | Fill #5

## 2019-12-30 ENCOUNTER — Other Ambulatory Visit: Payer: Self-pay

## 2019-12-30 DIAGNOSIS — N186 End stage renal disease: Secondary | ICD-10-CM | POA: Diagnosis not present

## 2019-12-30 DIAGNOSIS — Z992 Dependence on renal dialysis: Secondary | ICD-10-CM | POA: Diagnosis not present

## 2019-12-30 NOTE — Telephone Encounter (Signed)
Pharmacy Change

## 2020-01-01 DIAGNOSIS — N186 End stage renal disease: Secondary | ICD-10-CM | POA: Diagnosis not present

## 2020-01-01 DIAGNOSIS — Z992 Dependence on renal dialysis: Secondary | ICD-10-CM | POA: Diagnosis not present

## 2020-01-02 DIAGNOSIS — Z992 Dependence on renal dialysis: Secondary | ICD-10-CM | POA: Diagnosis not present

## 2020-01-02 DIAGNOSIS — N186 End stage renal disease: Secondary | ICD-10-CM | POA: Diagnosis not present

## 2020-01-03 DIAGNOSIS — Z992 Dependence on renal dialysis: Secondary | ICD-10-CM | POA: Diagnosis not present

## 2020-01-03 DIAGNOSIS — N186 End stage renal disease: Secondary | ICD-10-CM | POA: Diagnosis not present

## 2020-01-04 DIAGNOSIS — N186 End stage renal disease: Secondary | ICD-10-CM | POA: Diagnosis not present

## 2020-01-04 DIAGNOSIS — Z992 Dependence on renal dialysis: Secondary | ICD-10-CM | POA: Diagnosis not present

## 2020-01-05 DIAGNOSIS — Z992 Dependence on renal dialysis: Secondary | ICD-10-CM | POA: Diagnosis not present

## 2020-01-05 DIAGNOSIS — N186 End stage renal disease: Secondary | ICD-10-CM | POA: Diagnosis not present

## 2020-01-06 DIAGNOSIS — Z992 Dependence on renal dialysis: Secondary | ICD-10-CM | POA: Diagnosis not present

## 2020-01-06 DIAGNOSIS — N186 End stage renal disease: Secondary | ICD-10-CM | POA: Diagnosis not present

## 2020-01-08 DIAGNOSIS — N186 End stage renal disease: Secondary | ICD-10-CM | POA: Diagnosis not present

## 2020-01-08 DIAGNOSIS — Z992 Dependence on renal dialysis: Secondary | ICD-10-CM | POA: Diagnosis not present

## 2020-01-08 NOTE — Progress Notes (Signed)
BP (!) 151/79 (BP Location: Left Arm, Patient Position: Sitting, Cuff Size: Normal)   Pulse 67   Temp 98.3 F (36.8 C) (Oral)   Ht 5' 6.73" (1.695 m)   Wt 180 lb 6.4 oz (81.8 kg)   SpO2 99%   BMI 28.48 kg/m    Subjective:    [name], male    DOB: 1956-05-09, 64 y.o.   MRN: [id number]  HPI: James Arnold is a 64 y.o. male presenting on 01/09/2020 for comprehensive medical examination. Current medical complaints include:  HYPERTENSION / HYPERLIPIDEMIA Satisfied with current treatment? yes Duration of hypertension: chronic BP monitoring frequency: monthly BP medication side effects: no Past BP meds: losartan, lasix, carvedilol, amlodipine Duration of hyperlipidemia: chronic Cholesterol medication side effects: no Cholesterol supplements: none Past cholesterol medications: none Medication compliance: excellent compliance Aspirin: no Recent stressors: no Recurrent headaches: no Visual changes: no Palpitations: no Dyspnea: yes Chest pain: no Lower extremity edema: no Dizzy/lightheaded: no  No gout flares. Doing well.   Doing at home dialysis. Doing 6 nights a week for 8-10 hours  DEPRESSION Mood status: stable Satisfied with current treatment?: yes Symptom severity: mild  Duration of current treatment : chronic Side effects: no Medication compliance: excellent compliance Psychotherapy/counseling: no  Previous psychiatric medications: celexa Depressed mood: no Anxious mood: no Anhedonia: no Significant weight loss or gain: no Insomnia: no  Fatigue: no Feelings of worthlessness or guilt: no Impaired concentration/indecisiveness: no Suicidal ideations: no Hopelessness: no Crying spells: no Depression screen [facility] 2/9 01/09/2020 01/09/2020 09/13/2019 03/14/2019 07/13/2018  Decreased Interest 0 0 0 1 1  Down, Depressed, Hopeless 0 0 0 1 0  PHQ - 2 Score 0 0 0 2 1  Altered sleeping 0 - 1 0 1  Tired, decreased energy 0 - 1 1 2   Change in appetite 0  - 0 1 0  Feeling bad or failure about yourself  0 - 0 0 0  Trouble concentrating 0 - 0 1 0  Moving slowly or fidgety/restless 0 - 0 0 0  Suicidal thoughts 0 - 0 0 -  PHQ-9 Score 0 - 2 5 4   Difficult doing work/chores Not difficult at all - Not difficult at all Not difficult at all Not difficult at all  Some recent data might be hidden   Interim Problems from his last visit: no  Depression Screen done today and results listed below:  Depression screen [facility] 2/9 01/09/2020 01/09/2020 09/13/2019 03/14/2019 07/13/2018  Decreased Interest 0 0 0 1 1  Down, Depressed, Hopeless 0 0 0 1 0  PHQ - 2 Score 0 0 0 2 1  Altered sleeping 0 - 1 0 1  Tired, decreased energy 0 - 1 1 2   Change in appetite 0 - 0 1 0  Feeling bad or failure about yourself  0 - 0 0 0  Trouble concentrating 0 - 0 1 0  Moving slowly or fidgety/restless 0 - 0 0 0  Suicidal thoughts 0 - 0 0 -  PHQ-9 Score 0 - 2 5 4   Difficult doing work/chores Not difficult at all - Not difficult at all Not difficult at all Not difficult at all  Some recent data might be hidden    Past Medical History:  Past Medical History:  Diagnosis Date  . Acute renal failure (ARF) ([place]) 05/15/2017  . Anemia   . Benign hypertensive renal disease   . CAD (coronary artery disease)    a. 08/1996 s/p BMS to the  mLAD ([facility]); b. 12/2017 MV: EF 46%, fixed apical defect w/ significant GI uptake artifact. No ischemia-> low risk.  . CKD (chronic kidney disease), stage IV ([place])   . Depression   . Diastolic dysfunction    a. 12/2017 Echo: EF 60-65%, no rwma, Gr1 DD, mild AI/MR. Nl RVSP.  [name] Kitchen Dyspnea    with exertion  . FSGS (focal segmental glomerulosclerosis)   . Hyperlipidemia 10/2002  . Hypertension   . Myelofibrosis ([place]) 2010  . Smoker   . Thrombocytopenia [facility])     Surgical History:  Past Surgical History:  Procedure Laterality Date  . ANGIOPLASTY    . BONE MARROW ASPIRATION  03/2009  . CARDIAC CATHETERIZATION    . COLONOSCOPY WITH PROPOFOL N/A  08/09/2018   Procedure: COLONOSCOPY WITH PROPOFOL;  Surgeon: [name], MD;  Location: [facility] ENDOSCOPY;  Service: Gastroenterology;  Laterality: N/A;  . CORONARY ARTERY BYPASS GRAFT    . CORONARY STENT PLACEMENT  08/1996    Medications:  Current Outpatient Medications on File Prior to Visit  Medication Sig  . acetaminophen (TYLENOL) 500 MG tablet Take 1,000 mg by mouth every 6 (six) hours as needed.  [name] Kitchen acyclovir (ZOVIRAX) 200 MG capsule Take 1 capsule (200 mg total) by mouth 2 (two) times daily.  [name] Kitchen aspirin EC 81 MG tablet Take by mouth.  . Cholecalciferol (VITAMIN D-1000 MAX ST) 25 MCG (1000 UT) tablet Take 1,000 Units by mouth daily.   [name] Kitchen epoetin alfa-epbx (RETACRIT) 09811 UNIT/ML injection Inject into the skin.  . ferrous sulfate 325 (65 FE) MG tablet Take 1 tablet (325 mg total) by mouth daily with breakfast.  . gentamicin cream (GARAMYCIN) 0.1 % APP  CATHETER EXIT SITE D  . predniSONE (DELTASONE) 10 MG tablet Take 10 mg by mouth daily with breakfast.  . ruxolitinib phosphate (JAKAFI) 10 MG tablet Take 1 tablet (10 mg total) by mouth daily.  . traMADol (ULTRAM) 50 MG tablet Take 1 tablet (50 mg total) by mouth every 8 (eight) hours as needed. (Patient not taking: Reported on 12/02/2019)   No current facility-administered medications on file prior to visit.    Allergies:  No Known Allergies  Social History:  Social History   Socioeconomic History  . Marital status: Married    Spouse name: Not on file  . Number of children: Not on file  . Years of education: Not on file  . Highest education level: Not on file  Occupational History  . Not on file  Tobacco Use  . Smoking status: Current Every Day Smoker    Packs/day: 1.00    Types: Cigarettes    Last attempt to quit: 01/11/2018    Years since quitting: 1.9  . Smokeless tobacco: Never Used  Substance and Sexual Activity  . Alcohol use: No    Alcohol/week: 0.0 standard drinks    Comment: patient states he has not consumed  alcohol in the last month  . Drug use: No  . Sexual activity: Yes  Other Topics Concern  . Not on file  Social History Narrative  . Not on file   Social Determinants of Health   Financial Resource Strain:   . Difficulty of Paying Living Expenses:   Food Insecurity:   . Worried About Charity fundraiser in the Last Year:   . Arboriculturist in the Last Year:   Transportation Needs:   . Film/video editor (Medical):   [name] Kitchen Lack of Transportation (Non-Medical):   Physical Activity:   .  Days of Exercise per Week:   . Minutes of Exercise per Session:   Stress:   . Feeling of Stress :   Social Connections:   . Frequency of Communication with Friends and Family:   . Frequency of Social Gatherings with Friends and Family:   . Attends Religious Services:   . Active Member of Clubs or Organizations:   . Attends Archivist Meetings:   [name] Kitchen Marital Status:   Intimate Partner Violence:   . Fear of Current or Ex-Partner:   . Emotionally Abused:   [name] Kitchen Physically Abused:   . Sexually Abused:    Social History   Tobacco Use  Smoking Status Current Every Day Smoker  . Packs/day: 1.00  . Types: Cigarettes  . Last attempt to quit: 01/11/2018  . Years since quitting: 1.9  Smokeless Tobacco Never Used   Social History   Substance and Sexual Activity  Alcohol Use No  . Alcohol/week: 0.0 standard drinks   Comment: patient states he has not consumed alcohol in the last month    Family History:  Family History  Problem Relation Age of Onset  . Stroke Mother        Low BP stroke  . Depression Father   . Depression Sister        Breast  . Heart disease Other   . Breast cancer Other   . Lung cancer Other   . Ovarian cancer Other   . Stomach cancer Other     Past medical history, surgical history, medications, allergies, family history and social history reviewed with patient today and changes made to appropriate areas of the chart.   Review of Systems  Constitutional:  Positive for chills. Negative for diaphoresis, fever, malaise/fatigue and weight loss.  HENT: Negative.   Eyes: Negative.   Respiratory: Positive for cough, shortness of breath and wheezing. Negative for hemoptysis and sputum production.   Cardiovascular: Negative.   Gastrointestinal: Positive for heartburn. Negative for abdominal pain, blood in stool, constipation, diarrhea, melena, nausea and vomiting.  Genitourinary: Negative.   Musculoskeletal: Negative.   Skin: Negative.   Neurological: Negative.   Endo/Heme/Allergies: Negative for environmental allergies and polydipsia. Bruises/bleeds easily.  Psychiatric/Behavioral: Negative.     All other ROS negative except what is listed above and in the HPI.      Objective:    BP (!) 151/79 (BP Location: Left Arm, Patient Position: Sitting, Cuff Size: Normal)   Pulse 67   Temp 98.3 F (36.8 C) (Oral)   Ht 5' 6.73" (1.695 m)   Wt 180 lb 6.4 oz (81.8 kg)   SpO2 99%   BMI 28.48 kg/m   Wt Readings from Last 3 Encounters:  01/09/20 180 lb 6.4 oz (81.8 kg)  01/09/20 180 lb 6.4 oz (81.8 kg)  12/05/19 187 lb 6 oz (85 kg)    Physical Exam Vitals and nursing note reviewed.  Constitutional:      General: He is not in acute distress.    Appearance: Normal appearance. He is normal weight. He is not ill-appearing, toxic-appearing or diaphoretic.  HENT:     Head: Normocephalic and atraumatic.     Right Ear: Tympanic membrane, ear canal and external ear normal. There is no impacted cerumen.     Left Ear: Tympanic membrane, ear canal and external ear normal. There is no impacted cerumen.     Nose: Nose normal. No congestion or rhinorrhea.     Mouth/Throat:     Mouth: Mucous membranes are moist.  Pharynx: Oropharynx is clear. No oropharyngeal exudate or posterior oropharyngeal erythema.  Eyes:     General: No scleral icterus.       Right eye: No discharge.        Left eye: No discharge.     Extraocular Movements: Extraocular movements  intact.     Conjunctiva/sclera: Conjunctivae normal.     Pupils: Pupils are equal, round, and reactive to light.  Neck:     Vascular: No carotid bruit.  Cardiovascular:     Rate and Rhythm: Normal rate and regular rhythm.     Pulses: Normal pulses.     Heart sounds: No murmur. No friction rub. No gallop.   Pulmonary:     Effort: Pulmonary effort is normal. No respiratory distress.     Breath sounds: Normal breath sounds. No stridor. No wheezing, rhonchi or rales.  Chest:     Chest wall: No tenderness.  Abdominal:     General: Abdomen is flat. Bowel sounds are normal. There is no distension.     Palpations: Abdomen is soft. There is no mass.     Tenderness: There is no abdominal tenderness. There is no right CVA tenderness, left CVA tenderness, guarding or rebound.     Hernia: No hernia is present.  Genitourinary:    Comments: Genital exam deferred with shared decision making Musculoskeletal:        General: No swelling, tenderness, deformity or signs of injury.     Cervical back: Normal range of motion and neck supple. No rigidity. No muscular tenderness.     Right lower leg: No edema.     Left lower leg: No edema.  Lymphadenopathy:     Cervical: No cervical adenopathy.  Skin:    General: Skin is warm and dry.     Capillary Refill: Capillary refill takes less than 2 seconds.     Coloration: Skin is not jaundiced or pale.     Findings: No bruising, erythema, lesion or rash.  Neurological:     General: No focal deficit present.     Mental Status: He is alert and oriented to person, place, and time.     Cranial Nerves: No cranial nerve deficit.     Sensory: No sensory deficit.     Motor: No weakness.     Coordination: Coordination normal.     Gait: Gait normal.     Deep Tendon Reflexes: Reflexes normal.  Psychiatric:        Mood and Affect: Mood normal.        Behavior: Behavior normal.        Thought Content: Thought content normal.        Judgment: Judgment normal.      Results for orders placed or performed in visit on 12/05/19  Lactate dehydrogenase  Result Value Ref Range   LDH 535 (H) 98 - 192 U/L  Basic metabolic panel  Result Value Ref Range   Sodium 136 135 - 145 mmol/L   Potassium 4.3 3.5 - 5.1 mmol/L   Chloride 107 98 - 111 mmol/L   CO2 22 22 - 32 mmol/L   Glucose, Bld 117 (H) 70 - 99 mg/dL   BUN 48 (H) 8 - 23 mg/dL   Creatinine, Ser 4.17 (H) 0.61 - 1.24 mg/dL   Calcium 8.2 (L) 8.9 - 10.3 mg/dL   GFR calc non Af Amer 14 (L) >60 mL/min   GFR calc Af Amer 16 (L) >60 mL/min   Anion gap 7 5 -  15  CBC with Differential  Result Value Ref Range   WBC 4.7 4.0 - 10.5 K/uL   RBC 3.73 (L) 4.22 - 5.81 MIL/uL   Hemoglobin 11.2 (L) 13.0 - 17.0 g/dL   HCT 34.4 (L) 39.0 - 52.0 %   MCV 92.2 80.0 - 100.0 fL   MCH 30.0 26.0 - 34.0 pg   MCHC 32.6 30.0 - 36.0 g/dL   RDW 16.9 (H) 11.5 - 15.5 %   Platelets 93 (L) 150 - 400 K/uL   nRBC 0.6 (H) 0.0 - 0.2 %   Neutrophils Relative % 89 %   Neutro Abs 4.2 1.7 - 7.7 K/uL   Lymphocytes Relative 9 %   Lymphs Abs 0.4 (L) 0.7 - 4.0 K/uL   Monocytes Relative 2 %   Monocytes Absolute 0.1 0.1 - 1.0 K/uL   Eosinophils Relative 0 %   Eosinophils Absolute 0.0 0.0 - 0.5 K/uL   Basophils Relative 0 %   Basophils Absolute 0.0 0.0 - 0.1 K/uL   Immature Granulocytes 0 %   Abs Immature Granulocytes 0.02 0.00 - 0.07 K/uL      Assessment & Plan:   Problem List Items Addressed This Visit      Cardiovascular and Mediastinum   Senile purpura ([place])    Reassured patient. Continue to monitor. Call with any concerns.       Relevant Medications   carvedilol (COREG) 12.5 MG tablet   amLODipine (NORVASC) 10 MG tablet   furosemide (LASIX) 20 MG tablet   losartan (COZAAR) 50 MG tablet     Respiratory   Centrilobular emphysema (HCC)    Under good control on current regimen. Continue current regimen. Continue to monitor. Call with any concerns. Refills given. Labs drawn today.       Relevant Orders   CBC with  Differential/Platelet   Comprehensive metabolic panel     Endocrine   Secondary hyperparathyroidism ([place])    Follows with nephrology. Continue to monitor. Call with any concerns. Stable.       Relevant Orders   CBC with Differential/Platelet   Comprehensive metabolic panel     Genitourinary   Benign hypertensive renal disease    Under good control on current regimen. Continue current regimen. Continue to monitor. Call with any concerns. Refills given. Labs drawn today.       Relevant Medications   furosemide (LASIX) 20 MG tablet   Other Relevant Orders   CBC with Differential/Platelet   Comprehensive metabolic panel   Microalbumin, Urine Waived   UA/M w/rflx Culture, Routine   CKD (chronic kidney disease) stage 5, GFR less than 15 ml/min (HCC)    On dialysis at home. Continue to follow with nephrology. Continue to monitor. Call with any concerns.       Relevant Orders   CBC with Differential/Platelet   Comprehensive metabolic panel   Microalbumin, Urine Waived   Anemia of chronic kidney failure, stage 5 ([place])    Follows with hematology and nephrology. On dialysis. Continue to monitor. Call with any concerns.       FSGS (focal segmental glomerulosclerosis)    Follows with nephrology. Continue to monitor. Call with any concerns.       Relevant Orders   CBC with Differential/Platelet   Comprehensive metabolic panel     Other   Primary myelofibrosis ([place])    Continue to follow with hematology. Call with any concerns. Continue to monitor.       Hyperlipidemia    Has declined statins. Will  recheck labs today. Await results. Treat as needed.        Relevant Medications   carvedilol (COREG) 12.5 MG tablet   amLODipine (NORVASC) 10 MG tablet   furosemide (LASIX) 20 MG tablet   losartan (COZAAR) 50 MG tablet   Other Relevant Orders   CBC with Differential/Platelet   Comprehensive metabolic panel   Lipid Panel w/o Chol/HDL Ratio   Major depression, recurrent (HCC)     Under good control on current regimen. Continue current regimen. Continue to monitor. Call with any concerns. Refills given.        Relevant Medications   citalopram (CELEXA) 40 MG tablet   Other Relevant Orders   CBC with Differential/Platelet   Comprehensive metabolic panel   TSH   Gout    Under good control on current regimen. Continue current regimen. Continue to monitor. Call with any concerns. Refills given. Labs drawn today.       Relevant Orders   CBC with Differential/Platelet   Comprehensive metabolic panel   Uric acid   Dialysis patient [facility])    Doing at home peritoneal dialysis 6 nights a week. Continue to follow with nephrology. Continue to monitor. Call with any concerns.        Other Visit Diagnoses    Routine general medical examination at a health care facility    -  Primary   Vaccines up to date. Screening labs checked today. Colonoscopy up to date. Continue diet and exercise. Call with ay concerns.    Myelofibrosis ([place])       Relevant Orders   CBC with Differential/Platelet   Comprehensive metabolic panel   Screening for prostate cancer       Labs drawn today. Await results.    Relevant Orders   PSA       LABORATORY TESTING:  Health maintenance labs ordered today as discussed above.   The natural history of prostate cancer and ongoing controversy regarding screening and potential treatment outcomes of prostate cancer has been discussed with the patient. The meaning of a false positive PSA and a false negative PSA has been discussed. He indicates understanding of the limitations of this screening test and wishes to proceed with screening PSA testing.   IMMUNIZATIONS:   - Tdap: Tetanus vaccination status reviewed: last tetanus booster within 10 years. - Influenza: Up to date - Pneumovax: Up to date  SCREENING: - Colonoscopy: Up to date  Discussed with patient purpose of the colonoscopy is to detect colon cancer at curable precancerous or early  stages   PATIENT COUNSELING:    Sexuality: Discussed sexually transmitted diseases, partner selection, use of condoms, avoidance of unintended pregnancy  and contraceptive alternatives.   Advised to avoid cigarette smoking.  I discussed with the patient that most people either abstain from alcohol or drink within safe limits (<=14/week and <=4 drinks/occasion for males, <=7/weeks and <= 3 drinks/occasion for females) and that the risk for alcohol disorders and other health effects rises proportionally with the number of drinks per week and how often a drinker exceeds daily limits.  Discussed cessation/primary prevention of drug use and availability of treatment for abuse.   Diet: Encouraged to adjust caloric intake to maintain  or achieve ideal body weight, to reduce intake of dietary saturated fat and total fat, to limit sodium intake by avoiding high sodium foods and not adding table salt, and to maintain adequate dietary potassium and calcium preferably from fresh fruits, vegetables, and low-fat dairy products.    stressed  the importance of regular exercise  Injury prevention: Discussed safety belts, safety helmets, smoke detector, smoking near bedding or upholstery.   Dental health: Discussed importance of regular tooth brushing, flossing, and dental visits.   Follow up plan: NEXT PREVENTATIVE PHYSICAL DUE IN 1 YEAR. Return in about 6 months (around 07/10/2020).

## 2020-01-09 ENCOUNTER — Encounter: Payer: Self-pay | Admitting: Family Medicine

## 2020-01-09 ENCOUNTER — Telehealth: Payer: Self-pay | Admitting: *Deleted

## 2020-01-09 ENCOUNTER — Ambulatory Visit (INDEPENDENT_AMBULATORY_CARE_PROVIDER_SITE_OTHER): Payer: Medicare HMO

## 2020-01-09 ENCOUNTER — Other Ambulatory Visit: Payer: Self-pay

## 2020-01-09 ENCOUNTER — Ambulatory Visit (INDEPENDENT_AMBULATORY_CARE_PROVIDER_SITE_OTHER): Payer: Medicare HMO | Admitting: Family Medicine

## 2020-01-09 ENCOUNTER — Telehealth: Payer: Self-pay | Admitting: Family Medicine

## 2020-01-09 VITALS — BP 131/69 | HR 67 | Temp 98.3°F | Ht 66.0 in | Wt 180.4 lb

## 2020-01-09 VITALS — BP 131/69 | HR 67 | Temp 98.3°F | Ht 66.73 in | Wt 180.4 lb

## 2020-01-09 DIAGNOSIS — N051 Unspecified nephritic syndrome with focal and segmental glomerular lesions: Secondary | ICD-10-CM

## 2020-01-09 DIAGNOSIS — I129 Hypertensive chronic kidney disease with stage 1 through stage 4 chronic kidney disease, or unspecified chronic kidney disease: Secondary | ICD-10-CM

## 2020-01-09 DIAGNOSIS — E782 Mixed hyperlipidemia: Secondary | ICD-10-CM | POA: Diagnosis not present

## 2020-01-09 DIAGNOSIS — Z992 Dependence on renal dialysis: Secondary | ICD-10-CM

## 2020-01-09 DIAGNOSIS — Z0000 Encounter for general adult medical examination without abnormal findings: Secondary | ICD-10-CM

## 2020-01-09 DIAGNOSIS — Z125 Encounter for screening for malignant neoplasm of prostate: Secondary | ICD-10-CM | POA: Diagnosis not present

## 2020-01-09 DIAGNOSIS — N2581 Secondary hyperparathyroidism of renal origin: Secondary | ICD-10-CM

## 2020-01-09 DIAGNOSIS — D7581 Myelofibrosis: Secondary | ICD-10-CM | POA: Diagnosis not present

## 2020-01-09 DIAGNOSIS — N184 Chronic kidney disease, stage 4 (severe): Secondary | ICD-10-CM | POA: Diagnosis not present

## 2020-01-09 DIAGNOSIS — M109 Gout, unspecified: Secondary | ICD-10-CM

## 2020-01-09 DIAGNOSIS — Z87891 Personal history of nicotine dependence: Secondary | ICD-10-CM

## 2020-01-09 DIAGNOSIS — D471 Chronic myeloproliferative disease: Secondary | ICD-10-CM

## 2020-01-09 DIAGNOSIS — F3342 Major depressive disorder, recurrent, in full remission: Secondary | ICD-10-CM

## 2020-01-09 DIAGNOSIS — N185 Chronic kidney disease, stage 5: Secondary | ICD-10-CM | POA: Diagnosis not present

## 2020-01-09 DIAGNOSIS — N186 End stage renal disease: Secondary | ICD-10-CM | POA: Diagnosis not present

## 2020-01-09 DIAGNOSIS — F339 Major depressive disorder, recurrent, unspecified: Secondary | ICD-10-CM | POA: Diagnosis not present

## 2020-01-09 DIAGNOSIS — D692 Other nonthrombocytopenic purpura: Secondary | ICD-10-CM

## 2020-01-09 DIAGNOSIS — D631 Anemia in chronic kidney disease: Secondary | ICD-10-CM

## 2020-01-09 DIAGNOSIS — J432 Centrilobular emphysema: Secondary | ICD-10-CM | POA: Diagnosis not present

## 2020-01-09 LAB — UA/M W/RFLX CULTURE, ROUTINE
Bilirubin, UA: NEGATIVE
Glucose, UA: NEGATIVE
Ketones, UA: NEGATIVE
Leukocytes,UA: NEGATIVE
Nitrite, UA: NEGATIVE
Specific Gravity, UA: 1.01 (ref 1.005–1.030)
Urobilinogen, Ur: 0.2 mg/dL (ref 0.2–1.0)
pH, UA: 7 (ref 5.0–7.5)

## 2020-01-09 LAB — MICROALBUMIN, URINE WAIVED
Creatinine, Urine Waived: 50 mg/dL (ref 10–300)
Microalb, Ur Waived: 150 mg/L — ABNORMAL HIGH (ref 0–19)
Microalb/Creat Ratio: >300 mg/g — ABNORMAL HIGH (ref <30)

## 2020-01-09 LAB — MICROSCOPIC EXAMINATION
Bacteria, UA: NONE SEEN
RBC, Urine: NONE SEEN /hpf (ref 0–2)
WBC, UA: NONE SEEN /hpf (ref 0–5)

## 2020-01-09 MED ORDER — FUROSEMIDE 20 MG PO TABS: 20.0000 mg | ORAL_TABLET | Freq: Every day | ORAL | 1 refills | Status: DC

## 2020-01-09 MED ORDER — CITALOPRAM HYDROBROMIDE 40 MG PO TABS: 40.0000 mg | ORAL_TABLET | Freq: Every day | ORAL | 1 refills | Status: DC

## 2020-01-09 MED ORDER — LOSARTAN POTASSIUM 50 MG PO TABS: 50.0000 mg | ORAL_TABLET | Freq: Every day | ORAL | 1 refills | Status: DC

## 2020-01-09 MED ORDER — CARVEDILOL 12.5 MG PO TABS: 12.5000 mg | ORAL_TABLET | Freq: Two times a day (BID) | ORAL | 1 refills | Status: DC

## 2020-01-09 MED ORDER — AMLODIPINE BESYLATE 10 MG PO TABS: 10.0000 mg | ORAL_TABLET | Freq: Every day | ORAL | 1 refills | Status: DC

## 2020-01-09 NOTE — Assessment & Plan Note (Signed)
Under good control on current regimen. Continue current regimen. Continue to monitor. Call with any concerns. Refills given. Labs drawn today.   

## 2020-01-09 NOTE — Assessment & Plan Note (Signed)
On dialysis at home. Continue to follow with nephrology. Continue to monitor. Call with any concerns.

## 2020-01-09 NOTE — Assessment & Plan Note (Signed)
Reassured patient. Continue to monitor. Call with any concerns.  

## 2020-01-09 NOTE — Assessment & Plan Note (Signed)
Follows with hematology and nephrology. On dialysis. Continue to monitor. Call with any concerns.

## 2020-01-09 NOTE — Telephone Encounter (Signed)
Received referral for initial lung cancer screening scan. Contacted patient and obtained smoking history,(current, 70.5 pack year) as well as answering questions related to screening process. Patient denies signs of lung cancer such as weight loss or hemoptysis. Patient denies comorbidity that would prevent curative treatment if lung cancer were found. Patient is scheduled for shared decision making visit and CT scan on 01/18/20 at 1015am.

## 2020-01-09 NOTE — Assessment & Plan Note (Signed)
Follows with nephrology. Continue to monitor. Call with any concerns. Stable.

## 2020-01-09 NOTE — Patient Instructions (Addendum)
James Arnold , Thank you for taking time to come for your Medicare Wellness Visit. I appreciate your ongoing commitment to your health goals. Please review the following plan we discussed and let me know if I can assist you in the future.   Screening recommendations/referrals: Colonoscopy: completed 08/09/2018 Recommended yearly ophthalmology/optometry visit for glaucoma screening and checkup Recommended yearly dental visit for hygiene and checkup  Vaccinations: Influenza vaccine: completed  Pneumococcal vaccine: due at ate 65 Tdap vaccine: up to date  Shingles vaccine: shingrix eligible    Covid-19: We are recommending the vaccine to everyone who has not had an allergic reaction to any of the components of the vaccine. If you have specific questions about the vaccine, please bring them up with your health care provider to discuss them.   We will likely not be getting the vaccine in the office for the first rounds of vaccinations. The way they are releasing the vaccines is going to be through the health systems (like [place], [facility], [facility], [facility]), through your county health department, or through the pharmacies.   The [facility] Department is giving vaccines to those 65+ and Health Care Workers Teachers and [place] providers start 11/16/19, Essential workers start 3/10 and those with co-morbidities start 12/14/19 Call [phone] to schedule  If you are 65+ you can get a vaccine through [facility] by signing up for an appointment.  You can sign up by going to: [email or web address].  You can get more information by going to: [email or web address]  [employer] next door is giving the [employer]- you can call [phone] or stop by there to schedule.  Advanced directives: Advance directive discussed with you today. Even though you declined this today please call our office should you change your mind and we can give you the proper paperwork for you to fill  out.  Conditions/risks identified: Someone will be in contact with you regarding financial assistance, James Alexanders, James Arnold.   Next appointment: Follow up in one year for your annual wellness visit  Preventive Care 40-64 Years, Male Preventive care refers to lifestyle choices and visits with your health care provider that can promote health and wellness. What does preventive care include?  A yearly physical exam. This is also called an annual well check.  Dental exams once or twice a year.  Routine eye exams. Ask your health care provider how often you should have your eyes checked.  Personal lifestyle choices, including:  Daily care of your teeth and gums.  Regular physical activity.  Eating a healthy diet.  Avoiding tobacco and drug use.  Limiting alcohol use.  Practicing safe sex.  Taking low-dose aspirin every day starting at age 40. What happens during an annual well check? The services and screenings done by your health care provider during your annual well check will depend on your age, overall health, lifestyle risk factors, and family history of disease. Counseling  Your health care provider may ask you questions about your:  Alcohol use.  Tobacco use.  Drug use.  Emotional well-being.  Home and relationship well-being.  Sexual activity.  Eating habits.  Work and work Statistician. Screening  You may have the following tests or measurements:  Height, weight, and BMI.  Blood pressure.  Lipid and cholesterol levels. These may be checked every 5 years, or more frequently if you are over 5 years old.  Skin check.  Lung cancer screening. You may have this screening every year starting at age 36  if you have a 30-pack-year history of smoking and currently smoke or have quit within the past 15 years.  Fecal occult blood test (FOBT) of the stool. You may have this test every year starting at age 36.  Flexible sigmoidoscopy or  colonoscopy. You may have a sigmoidoscopy every 5 years or a colonoscopy every 10 years starting at age 56.  Prostate cancer screening. Recommendations will vary depending on your family history and other risks.  Hepatitis C blood test.  Hepatitis B blood test.  Sexually transmitted disease (STD) testing.  Diabetes screening. This is done by checking your blood sugar (glucose) after you have not eaten for a while (fasting). You may have this done every 1-3 years. Discuss your test results, treatment options, and if necessary, the need for more tests with your health care provider. Vaccines  Your health care provider may recommend certain vaccines, such as:  Influenza vaccine. This is recommended every year.  Tetanus, diphtheria, and acellular pertussis (Tdap, Td) vaccine. You may need a Td booster every 10 years.  Zoster vaccine. You may need this after age 47.  Pneumococcal 13-valent conjugate (PCV13) vaccine. You may need this if you have certain conditions and have not been vaccinated.  Pneumococcal polysaccharide (PPSV23) vaccine. You may need one or two doses if you smoke cigarettes or if you have certain conditions. Talk to your health care provider about which screenings and vaccines you need and how often you need them. This information is not intended to replace advice given to you by your health care provider. Make sure you discuss any questions you have with your health care provider. Document Released: 10/05/2015 Document Revised: 05/28/2016 Document Reviewed: 07/10/2015 Elsevier Interactive Patient Education  2017 [employer] Prevention in the Home Falls can cause injuries. They can happen to people of all ages. There are many things you can do to make your home safe and to help prevent falls. What can I do on the outside of my home?  Regularly fix the edges of walkways and driveways and fix any cracks.  Remove anything that might make you trip as you walk  through a door, such as a raised step or threshold.  Trim any bushes or trees on the path to your home.  Use bright outdoor lighting.  Clear any walking paths of anything that might make someone trip, such as rocks or tools.  Regularly check to see if handrails are loose or broken. Make sure that both sides of any steps have handrails.  Any raised decks and porches should have guardrails on the edges.  Have any leaves, snow, or ice cleared regularly.  Use sand or salt on walking paths during winter.  Clean up any spills in your garage right away. This includes oil or grease spills. What can I do in the bathroom?  Use night lights.  Install grab bars by the toilet and in the tub and shower. Do not use towel bars as grab bars.  Use non-skid mats or decals in the tub or shower.  If you need to sit down in the shower, use a plastic, non-slip stool.  Keep the floor dry. Clean up any water that spills on the floor as soon as it happens.  Remove soap buildup in the tub or shower regularly.  Attach bath mats securely with double-sided non-slip rug tape.  Do not have throw rugs and other things on the floor that can make you trip. What can I do in the bedroom?  Use night lights.  Make sure that you have a light by your bed that is easy to reach.  Do not use any sheets or blankets that are too big for your bed. They should not hang down onto the floor.  Have a firm chair that has side arms. You can use this for support while you get dressed.  Do not have throw rugs and other things on the floor that can make you trip. What can I do in the kitchen?  Clean up any spills right away.  Avoid walking on wet floors.  Keep items that you use a lot in easy-to-reach places.  If you need to reach something above you, use a strong step stool that has a grab bar.  Keep electrical cords out of the way.  Do not use floor polish or wax that makes floors slippery. If you must use wax,  use non-skid floor wax.  Do not have throw rugs and other things on the floor that can make you trip. What can I do with my stairs?  Do not leave any items on the stairs.  Make sure that there are handrails on both sides of the stairs and use them. Fix handrails that are broken or loose. Make sure that handrails are as long as the stairways.  Check any carpeting to make sure that it is firmly attached to the stairs. Fix any carpet that is loose or worn.  Avoid having throw rugs at the top or bottom of the stairs. If you do have throw rugs, attach them to the floor with carpet tape.  Make sure that you have a light switch at the top of the stairs and the bottom of the stairs. If you do not have them, ask someone to add them for you. What else can I do to help prevent falls?  Wear shoes that:  Do not have high heels.  Have rubber bottoms.  Are comfortable and fit you well.  Are closed at the toe. Do not wear sandals.  If you use a stepladder:  Make sure that it is fully opened. Do not climb a closed stepladder.  Make sure that both sides of the stepladder are locked into place.  Ask someone to hold it for you, if possible.  Clearly mark and make sure that you can see:  Any grab bars or handrails.  First and last steps.  Where the edge of each step is.  Use tools that help you move around (mobility aids) if they are needed. These include:  Canes.  Walkers.  Scooters.  Crutches.  Turn on the lights when you go into a dark area. Replace any light bulbs as soon as they burn out.  Set up your furniture so you have a clear path. Avoid moving your furniture around.  If any of your floors are uneven, fix them.  If there are any pets around you, be aware of where they are.  Review your medicines with your doctor. Some medicines can make you feel dizzy. This can increase your chance of falling. Ask your doctor what other things that you can do to help prevent  falls. This information is not intended to replace advice given to you by your health care provider. Make sure you discuss any questions you have with your health care provider. Document Released: 07/05/2009 Document Revised: 02/14/2016 Document Reviewed: 10/13/2014 Elsevier Interactive Patient Education  2017 [employer].

## 2020-01-09 NOTE — Chronic Care Management (AMB) (Signed)
  Chronic Care Management   Note  01/09/2020 Name: James Arnold MRN: [id number] DOB: [birth year]  James Arnold is a 64 y.o. year old male who is a primary care patient of Ramell, Wacha, DO. I reached out to James Arnold by phone today in response to a referral sent by [name]'s PCP, Park Liter DO     [name] was given information about Chronic Care Management services today including:  1. CCM service includes personalized support from designated clinical staff supervised by his physician, including individualized plan of care and coordination with other care providers 2. 24/7 contact phone numbers for assistance for urgent and routine care needs. 3. Service will only be billed when office clinical staff spend 20 minutes or more in a month to coordinate care. 4. Only one practitioner may furnish and bill the service in a calendar month. 5. The patient may stop CCM services at any time (effective at the end of the month) by phone call to the office staff. 6. The patient will be responsible for cost sharing (co-pay) of up to 20% of the service fee (after annual deductible is met).  Patient's wife did not agree to enrollment in care management services and does not wish to consider at this time.  Follow up plan: The care management team is available to follow up with the patient after provider conversation with the patient regarding recommendation for care management engagement and subsequent re-referral to the care management team.   Glenna Durand, LPN Health Advisor, [name] ??[email or web address] ??[phone]

## 2020-01-09 NOTE — Assessment & Plan Note (Signed)
Has declined statins. Will recheck labs today. Await results. Treat as needed.

## 2020-01-09 NOTE — Progress Notes (Signed)
Subjective:   James Arnold is a 64 y.o. male who presents for Medicare Annual/Subsequent preventive examination.  Review of Systems:  Cardiac Risk Factors include: advanced age (>53men, >15 women);male gender;dyslipidemia;hypertension;smoking/ tobacco exposure     Objective:    Vitals: BP 131/69   Pulse 67   Temp 98.3 F (36.8 C) (Oral)   Ht 5\' 6"  (1.676 m)   Wt 180 lb 6.4 oz (81.8 kg)   BMI 29.12 kg/m   Body mass index is 29.12 kg/m.  Advanced Directives 01/09/2020 12/02/2019 10/07/2019 06/07/2019 04/26/2019 03/10/2019 01/28/2019  Does Patient Have a Medical Advance Directive? No No No No No No No  Does patient want to make changes to medical advance directive? No - Patient declined - - - - - -  Would patient like information on creating a medical advance directive? - No - Patient declined No - Patient declined No - Patient declined No - Patient declined No - Patient declined No - Patient declined    Tobacco Social History   Tobacco Use  Smoking Status Current Every Day Smoker  . Packs/day: 1.00  . Types: Cigarettes  . Last attempt to quit: 01/11/2018  . Years since quitting: 1.9  Smokeless Tobacco Never Used     Ready to quit: Not Answered Counseling given: Not Answered   Clinical Intake:  Pre-visit preparation completed: Yes  Pain : No/denies pain     Nutritional Status: BMI 25 -29 Overweight Nutritional Risks: None Diabetes: No  How often do you need to have someone help you when you read instructions, pamphlets, or other written materials from your doctor or pharmacy?: 1 - Never  Interpreter Needed?: No  Information entered by :: Jiah Bari,LPN  Past Medical History:  Diagnosis Date  . Acute renal failure (ARF) ([place]) 05/15/2017  . Anemia   . Benign hypertensive renal disease   . CAD (coronary artery disease)    a. 08/1996 s/p BMS to the mLAD ([facility]); b. 12/2017 MV: EF 46%, fixed apical defect w/ significant GI uptake artifact. No ischemia-> low risk.   . CKD (chronic kidney disease), stage IV ([place])   . Depression   . Diastolic dysfunction    a. 12/2017 Echo: EF 60-65%, no rwma, Gr1 DD, mild AI/MR. Nl RVSP.  [name] Kitchen Dyspnea    with exertion  . FSGS (focal segmental glomerulosclerosis)   . Hyperlipidemia 10/2002  . Hypertension   . Myelofibrosis ([place]) 2010  . Smoker   . Thrombocytopenia ([place])    Past Surgical History:  Procedure Laterality Date  . ANGIOPLASTY    . BONE MARROW ASPIRATION  03/2009  . CARDIAC CATHETERIZATION    . COLONOSCOPY WITH PROPOFOL N/A 08/09/2018   Procedure: COLONOSCOPY WITH PROPOFOL;  Surgeon: [name], MD;  Location[facility] ENDOSCOPY;  Service: Gastroenterology;  Laterality: N/A;  . CORONARY ARTERY BYPASS GRAFT    . CORONARY STENT PLACEMENT  08/1996   Family History  Problem Relation Age of Onset  . Stroke Mother        Low BP stroke  . Depression Father   . Depression Sister        Breast  . Heart disease Other   . Breast cancer Other   . Lung cancer Other   . Ovarian cancer Other   . Stomach cancer Other    Social History   Socioeconomic History  . Marital status: Married    Spouse name: Not on file  . Number of children: Not on file  .  Years of education: Not on file  . Highest education level: Not on file  Occupational History  . Not on file  Tobacco Use  . Smoking status: Current Every Day Smoker    Packs/day: 1.00    Types: Cigarettes    Last attempt to quit: 01/11/2018    Years since quitting: 1.9  . Smokeless tobacco: Never Used  Substance and Sexual Activity  . Alcohol use: No    Alcohol/week: 0.0 standard drinks    Comment: patient states he has not consumed alcohol in the last month  . Drug use: No  . Sexual activity: Yes  Other Topics Concern  . Not on file  Social History Narrative  . Not on file   Social Determinants of Health   Financial Resource Strain:   . Difficulty of Paying Living Expenses:   Food Insecurity:   . Worried About Charity fundraiser in the Last  Year:   . Arboriculturist in the Last Year:   Transportation Needs:   . Film/video editor (Medical):   [name] Kitchen Lack of Transportation (Non-Medical):   Physical Activity:   . Days of Exercise per Week:   . Minutes of Exercise per Session:   Stress:   . Feeling of Stress :   Social Connections:   . Frequency of Communication with Friends and Family:   . Frequency of Social Gatherings with Friends and Family:   . Attends Religious Services:   . Active Member of Clubs or Organizations:   . Attends Archivist Meetings:   [name] Kitchen Marital Status:     Outpatient Encounter Medications as of 01/09/2020  Medication Sig  . acetaminophen (TYLENOL) 500 MG tablet Take 1,000 mg by mouth every 6 (six) hours as needed.  [name] Kitchen acyclovir (ZOVIRAX) 200 MG capsule Take 1 capsule (200 mg total) by mouth 2 (two) times daily.  [name] Kitchen aspirin EC 81 MG tablet Take by mouth.  . Cholecalciferol (VITAMIN D-1000 MAX ST) 25 MCG (1000 UT) tablet Take 1,000 Units by mouth daily.   [name] Kitchen epoetin alfa-epbx (RETACRIT) 40086 UNIT/ML injection Inject into the skin.  . ferrous sulfate 325 (65 FE) MG tablet Take 1 tablet (325 mg total) by mouth daily with breakfast.  . gentamicin cream (GARAMYCIN) 0.1 % APP  CATHETER EXIT SITE D  . predniSONE (DELTASONE) 10 MG tablet Take 10 mg by mouth daily with breakfast.  . ruxolitinib phosphate (JAKAFI) 10 MG tablet Take 1 tablet (10 mg total) by mouth daily.  . [DISCONTINUED] citalopram (CELEXA) 40 MG tablet Take 1 tablet (40 mg total) by mouth daily.  . [DISCONTINUED] furosemide (LASIX) 20 MG tablet Take 1 tablet (20 mg total) by mouth daily.  . [DISCONTINUED] losartan (COZAAR) 50 MG tablet Take 1 tablet (50 mg total) by mouth daily.  . traMADol (ULTRAM) 50 MG tablet Take 1 tablet (50 mg total) by mouth every 8 (eight) hours as needed. (Patient not taking: Reported on 12/02/2019)  . [DISCONTINUED] amLODipine (NORVASC) 10 MG tablet Take 1 tablet (10 mg total) by mouth daily.  . [DISCONTINUED]  carvedilol (COREG) 12.5 MG tablet Take 1 tablet (12.5 mg total) by mouth 2 (two) times daily.   No facility-administered encounter medications on file as of 01/09/2020.    Activities of Daily Living In your present state of health, do you have any difficulty performing the following activities: 01/09/2020 09/13/2019  Hearing? N N  Comment no hearing aids -  Vision? N N  Comment reading glasses, [facility]  annually -  Difficulty concentrating or making decisions? N N  Walking or climbing stairs? N N  Dressing or bathing? N N  Doing errands, shopping? N N  Preparing Food and eating ? N -  Using the Toilet? N -  In the past six months, have you accidently leaked urine? N -  Do you have problems with loss of bowel control? N -  Managing your Medications? N -  Managing your Finances? N -  Housekeeping or managing your Housekeeping? N -  Some recent data might be hidden    Patient Care Team: Valerie Roys, DO as PCP - General (Family Medicine) End, Harrell Gave, MD as PCP - Cardiology (Cardiology) Cammie Sickle, MD as Consulting Physician (Internal Medicine)   Assessment:   This is a routine wellness examination for James Arnold.  Exercise Activities and Dietary recommendations Current Exercise Habits: The patient does not participate in regular exercise at present(working in yard), Exercise limited by: None identified  Goals Addressed   None     Fall Risk: Fall Risk  01/09/2020 09/13/2019 07/13/2018 05/10/2018 02/19/2018  Falls in the past year? 0 0 No No No  Number falls in past yr: 0 0 - - -  Injury with Fall? 0 0 - - -    FALL RISK PREVENTION PERTAINING TO THE HOME:  Any stairs in or around the home? Yes  If so, are there any without handrails? No   Home free of loose throw rugs in walkways, pet beds, electrical cords, etc? Yes  Adequate lighting in your home to reduce risk of falls? Yes   ASSISTIVE DEVICES UTILIZED TO PREVENT FALLS:  Life alert? No  Use of a  cane, walker or w/c? No  Grab bars in the bathroom? No  Shower chair or bench in shower? No  Elevated toilet seat or a handicapped toilet? No   TIMED UP AND GO:  Was the test performed? yes  Length of time to ambulate 10 feet: 8 sec.   GAIT:  Appearance of gait: Gait steady and fast without the use of an assistive device.  Education: Fall risk prevention has been discussed.  Intervention(s) required? No  DME/home health order needed?  No   Depression Screen PHQ 2/9 Scores 01/09/2020 01/09/2020 09/13/2019 03/14/2019  PHQ - 2 Score 0 0 0 2  PHQ- 9 Score 0 - 2 5    Cognitive Function     6CIT Screen 01/09/2020 07/13/2018 02/18/2017  What Year? 0 points 0 points 0 points  What month? 0 points 0 points 0 points  What time? 0 points 0 points 0 points  Count back from 20 0 points 0 points 0 points  Months in reverse 0 points 0 points 0 points  Repeat phrase 0 points 0 points 2 points  Total Score 0 0 2    Immunization History  Administered Date(s) Administered  . Influenza-Unspecified 06/23/2019, 07/19/2019  . PPD Test 11/18/2018, 03/07/2019  . Pneumococcal Polysaccharide-23 07/26/2014, 05/02/2019  . Tdap 07/26/2014    Qualifies for Shingles Vaccine? Yes  Zostavax completed n/a. Due for Shingrix. Education has been provided regarding the importance of this vaccine. Pt has been advised to call insurance company to determine out of pocket expense. Advised may also receive vaccine at local pharmacy or Health Dept. Verbalized acceptance and understanding.  Tdap: up to date   Flu Vaccine: up to date   Pneumococcal Vaccine: not indicated   Covid-19 Vaccine: Information provided  Screening Tests Health Maintenance  Topic Date  Due  . COVID-19 Vaccine (1) Never done  . INFLUENZA VACCINE  04/22/2020  . TETANUS/TDAP  07/26/2024  . COLONOSCOPY  08/09/2028  . HIV Screening  Completed  . Hepatitis C Screening  Addressed   Cancer Screenings:  Colorectal Screening: Completed  2019. Repeat every 10 years  Lung Cancer Screening: (Low Dose CT Chest recommended if Age 53-80 years, 30 pack-year currently smoking OR have quit w/in 15years.) does qualify.   Lung Cancer Screening Referral: An Epic message has been sent to Burgess Estelle, RN (Oncology Nurse Navigator) regarding the possible need for this exam. Raquel Sarna will review the patient's chart to determine if the patient truly qualifies for the exam. If the patient qualifies, Raquel Sarna will order the Low Dose CT of the chest to facilitate the scheduling of this exam.  Additional Screening:  Hepatitis C Screening: does qualify; Completed 2015  Vision Screening: Recommended annual ophthalmology exams for early detection of glaucoma and other disorders of the eye. Is the patient up to date with their annual eye exam?  Yes  Who is the provider or what is the name of the office in which the pt attends annual eye exams? [facility]    Dental Screening: Recommended annual dental exams for proper oral hygiene  Community Resource Referral:  CRR required this visit?  No        Plan:  I have personally reviewed and addressed the Medicare Annual Wellness questionnaire and have noted the following in the patient's chart:  A. Medical and social history B. Use of alcohol, tobacco or illicit drugs  C. Current medications and supplements D. Functional ability and status E.  Nutritional status F.  Physical activity G. Advance directives H. List of other physicians I.  Hospitalizations, surgeries, and ER visits in previous 12 months J.  [place] such as hearing and vision if needed, cognitive and depression L. Referrals and appointments   In addition, I have reviewed and discussed with patient certain preventive protocols, quality metrics, and best practice recommendations. A written personalized care plan for preventive services as well as general preventive health recommendations were provided to  patient.   Signed,   Bevelyn Ngo, LPN  1/[id number]/[id number] Nurse Health Advisor   Nurse Notes: none

## 2020-01-09 NOTE — Patient Instructions (Signed)

## 2020-01-09 NOTE — Assessment & Plan Note (Signed)
Doing at home peritoneal dialysis 6 nights a week. Continue to follow with nephrology. Continue to monitor. Call with any concerns.

## 2020-01-09 NOTE — Assessment & Plan Note (Signed)
Under good control on current regimen. Continue current regimen. Continue to monitor. Call with any concerns. Refills given.   

## 2020-01-09 NOTE — Assessment & Plan Note (Signed)
Follows with nephrology. Continue to monitor. Call with any concerns.  

## 2020-01-09 NOTE — Assessment & Plan Note (Signed)
Continue to follow with hematology. Call with any concerns. Continue to monitor.  ?

## 2020-01-10 DIAGNOSIS — N186 End stage renal disease: Secondary | ICD-10-CM | POA: Diagnosis not present

## 2020-01-10 DIAGNOSIS — Z992 Dependence on renal dialysis: Secondary | ICD-10-CM | POA: Diagnosis not present

## 2020-01-10 LAB — CBC WITH DIFFERENTIAL/PLATELET
Basophils Absolute: 0 10*3/uL (ref 0.0–0.2)
Basos: 0 %
EOS (ABSOLUTE): 0 10*3/uL (ref 0.0–0.4)
Eos: 0 %
Hematocrit: 33.2 % — ABNORMAL LOW (ref 37.5–51.0)
Hemoglobin: 11 g/dL — ABNORMAL LOW (ref 13.0–17.7)
Immature Grans (Abs): 0 10*3/uL (ref 0.0–0.1)
Immature Granulocytes: 1 %
Lymphocytes Absolute: 0.5 10*3/uL — ABNORMAL LOW (ref 0.7–3.1)
Lymphs: 9 %
MCH: 30.1 pg (ref 26.6–33.0)
MCHC: 33.1 g/dL (ref 31.5–35.7)
MCV: 91 fL (ref 79–97)
Monocytes Absolute: 0.2 10*3/uL (ref 0.1–0.9)
Monocytes: 3 %
Neutrophils Absolute: 4.9 10*3/uL (ref 1.4–7.0)
Neutrophils: 87 %
Platelets: 143 10*3/uL — ABNORMAL LOW (ref 150–450)
RBC: 3.65 x10E6/uL — ABNORMAL LOW (ref 4.14–5.80)
RDW: 16.7 % — ABNORMAL HIGH (ref 11.6–15.4)
WBC: 5.6 10*3/uL (ref 3.4–10.8)

## 2020-01-10 LAB — COMPREHENSIVE METABOLIC PANEL
ALT: 16 IU/L (ref 0–44)
AST: 18 IU/L (ref 0–40)
Albumin/Globulin Ratio: 2.2 (ref 1.2–2.2)
Albumin: 4.3 g/dL (ref 3.8–4.8)
Alkaline Phosphatase: 49 IU/L (ref 39–117)
BUN/Creatinine Ratio: 13 (ref 10–24)
BUN: 52 mg/dL — ABNORMAL HIGH (ref 8–27)
Bilirubin Total: 0.4 mg/dL (ref 0.0–1.2)
CO2: 20 mmol/L (ref 20–29)
Calcium: 8.6 mg/dL (ref 8.6–10.2)
Chloride: 102 mmol/L (ref 96–106)
Creatinine, Ser: 3.97 mg/dL — ABNORMAL HIGH (ref 0.76–1.27)
GFR calc Af Amer: 17 mL/min/{1.73_m2} — ABNORMAL LOW (ref >59)
GFR calc non Af Amer: 15 mL/min/{1.73_m2} — ABNORMAL LOW (ref >59)
Globulin, Total: 2 g/dL (ref 1.5–4.5)
Glucose: 88 mg/dL (ref 65–99)
Potassium: 5 mmol/L (ref 3.5–5.2)
Sodium: 138 mmol/L (ref 134–144)
Total Protein: 6.3 g/dL (ref 6.0–8.5)

## 2020-01-10 LAB — LIPID PANEL W/O CHOL/HDL RATIO
Cholesterol, Total: 221 mg/dL — ABNORMAL HIGH (ref 100–199)
HDL: 49 mg/dL (ref >39)
LDL Chol Calc (NIH): 152 mg/dL — ABNORMAL HIGH (ref 0–99)
Triglycerides: 111 mg/dL (ref 0–149)
VLDL Cholesterol Cal: 20 mg/dL (ref 5–40)

## 2020-01-10 LAB — TSH: TSH: 1.46 u[IU]/mL (ref 0.450–4.500)

## 2020-01-10 LAB — URIC ACID: Uric Acid: 8.6 mg/dL — ABNORMAL HIGH (ref 3.8–8.4)

## 2020-01-10 LAB — PSA: Prostate Specific Ag, Serum: 0.3 ng/mL (ref 0.0–4.0)

## 2020-01-11 ENCOUNTER — Telehealth: Payer: Self-pay | Admitting: Family Medicine

## 2020-01-11 DIAGNOSIS — N186 End stage renal disease: Secondary | ICD-10-CM | POA: Diagnosis not present

## 2020-01-11 DIAGNOSIS — Z992 Dependence on renal dialysis: Secondary | ICD-10-CM | POA: Diagnosis not present

## 2020-01-11 NOTE — Telephone Encounter (Signed)
°  Community Resource Referral   [place] 01/11/2020   Name: James Arnold    MRN: [id number]    DOB: [birth year]    AGE: 64 y.o.    GENDER: male    PCP Park Liter P, DO.   Called pt regarding [employer] Referral, spoke with pt's wife.  Medicaid: Ms. Crescenzo stated that her husband had applied and been denied 2 years ago for Medicaid and for food stamps and I asked if they would consider re-applying and she stated that she couldn't even afford Obamacare and that she didn't think any of their circumstances had changed since then. Asked about Medicaid spend down program and she said that it was not feasible.  Disability: Pt receives disability now and the wife stated that she is ineligible due to her previous work history.  Rx Assistance: Pt is covered by [facility] and gets most of his prescriptions in the mail, his chemo drug ruxolitinib phosphate (JAKAFI) 10 MG tablet [[id number]]  is paid for through a grant and so he has few prescription drug costs if any at all, Pt's wife uses Medication Management through [facility]. Pt is being treated at [facility].  Medical Bills She said that they had tried applying for [facility] care but she was told that they could not remove the money owed from her COVID-19 stay but could discount the bill amount. She could not give me the amount that she owed. She stated that her husband's bill was under $1000 due to [facility]. She stated that she is covered through [facility] but has to stay within their network.  Financial Assistance: Pt stated that she does need help with monthly bills, will apply for [facility] and patient to send most current energy bill, (the mortgage statement is not available as it his held by the bank) so will apply for [employer] assistance.  Additional Support: Pt agreed to CCM when he spoke with Tyler Aas, LPN but wife later declined services. Msg'd Dr. Wynetta Emery to discuss as well as Palliative Care option. FFoo Follow up  on: 01/13/20 with status of [facility] application and statement.   [place] Management [email or web address]   [phone]

## 2020-01-11 NOTE — Telephone Encounter (Signed)
Email to Pt's Wife request for [employer] Statement From: [name])  Sent: Wednesday, January 11, 2020 3:24 PM To: [email or web address] Subject: Secure: Community Resources - [employer] Assistance  Good Afternoon Ms. Cuff, Thank you for speaking with me today regarding [employer]. Could you please reply back to this email with an attached copy of your most recent [place] due so that we may apply for the [facility] for assistance?  Thank you and please let me know if you have any other questions,   [place] . [email or web address]  [phone]

## 2020-01-12 DIAGNOSIS — Z992 Dependence on renal dialysis: Secondary | ICD-10-CM | POA: Diagnosis not present

## 2020-01-12 DIAGNOSIS — N186 End stage renal disease: Secondary | ICD-10-CM | POA: Diagnosis not present

## 2020-01-13 DIAGNOSIS — Z992 Dependence on renal dialysis: Secondary | ICD-10-CM | POA: Diagnosis not present

## 2020-01-13 DIAGNOSIS — N186 End stage renal disease: Secondary | ICD-10-CM | POA: Diagnosis not present

## 2020-01-13 NOTE — Telephone Encounter (Signed)
2nd Follow up email to pt's wife  From: [name] [facility])  Sent: Friday, January 13, 2020 11:29 AM To: [email or web address] Subject: Following Up: Secure: [place] Morning Ms. Alferd Apa, Please reply back with a copy of your [employer] Bill when you have a chance. Once I receive that from you I can proceed with applying for financial assistance. Thank you,

## 2020-01-15 DIAGNOSIS — Z992 Dependence on renal dialysis: Secondary | ICD-10-CM | POA: Diagnosis not present

## 2020-01-15 DIAGNOSIS — N186 End stage renal disease: Secondary | ICD-10-CM | POA: Diagnosis not present

## 2020-01-16 DIAGNOSIS — N186 End stage renal disease: Secondary | ICD-10-CM | POA: Diagnosis not present

## 2020-01-16 DIAGNOSIS — Z992 Dependence on renal dialysis: Secondary | ICD-10-CM | POA: Diagnosis not present

## 2020-01-17 DIAGNOSIS — Z992 Dependence on renal dialysis: Secondary | ICD-10-CM | POA: Diagnosis not present

## 2020-01-17 DIAGNOSIS — N186 End stage renal disease: Secondary | ICD-10-CM | POA: Diagnosis not present

## 2020-01-18 ENCOUNTER — Other Ambulatory Visit: Payer: Self-pay

## 2020-01-18 ENCOUNTER — Inpatient Hospital Stay: Payer: Medicare HMO | Attending: Oncology | Admitting: Hospice and Palliative Medicine

## 2020-01-18 ENCOUNTER — Ambulatory Visit
Admission: RE | Admit: 2020-01-18 | Discharge: 2020-01-18 | Disposition: A | Discharge status: Home / Self Care | Payer: Medicare HMO | Source: Ambulatory Visit | Attending: Oncology | Admitting: Oncology

## 2020-01-18 DIAGNOSIS — Z122 Encounter for screening for malignant neoplasm of respiratory organs: Secondary | ICD-10-CM | POA: Diagnosis not present

## 2020-01-18 DIAGNOSIS — Z87891 Personal history of nicotine dependence: Secondary | ICD-10-CM

## 2020-01-18 DIAGNOSIS — F17210 Nicotine dependence, cigarettes, uncomplicated: Secondary | ICD-10-CM | POA: Diagnosis not present

## 2020-01-18 DIAGNOSIS — N186 End stage renal disease: Secondary | ICD-10-CM | POA: Diagnosis not present

## 2020-01-18 DIAGNOSIS — Z992 Dependence on renal dialysis: Secondary | ICD-10-CM | POA: Diagnosis not present

## 2020-01-18 NOTE — Progress Notes (Signed)
In accordance with CMS guidelines, patient has met eligibility criteria including age, absence of signs or symptoms of lung cancer.  Social History   Tobacco Use  . Smoking status: Current Every Day Smoker    Packs/day: 1.50    Years: 47.00    Pack years: 70.50    Types: Cigarettes  . Smokeless tobacco: Never Used  Substance Use Topics  . Alcohol use: No    Alcohol/week: 0.0 standard drinks    Comment: patient states he has not consumed alcohol in the last month  . Drug use: No      A shared decision-making session was conducted prior to the performance of CT scan. This includes one or more decision aids, includes benefits and harms of screening, follow-up diagnostic testing, over-diagnosis, false positive rate, and total radiation exposure.   Counseling on the importance of adherence to annual lung cancer LDCT screening, impact of co-morbidities, and ability or willingness to undergo diagnosis and treatment is imperative for compliance of the program.   Counseling on the importance of continued smoking cessation for former smokers; the importance of smoking cessation for current smokers, and information about tobacco cessation interventions have been given to patient including [place] and 1800 quit [place] programs.   Written order for lung cancer screening with LDCT has been given to the patient and any and all questions have been answered to the best of my abilities.    Yearly follow up will be coordinated by Burgess Estelle, Thoracic Navigator.  Time Total: 15 minutes  Visit consisted of counseling and education dealing with complex health screening. Greater than 50%  of this time was spent counseling and coordinating care related to the above assessment and plan.  Signed by: Altha Harm, PhD, NP-C [phone] (Work Cell)

## 2020-01-19 ENCOUNTER — Telehealth: Payer: Self-pay | Admitting: *Deleted

## 2020-01-19 DIAGNOSIS — Z992 Dependence on renal dialysis: Secondary | ICD-10-CM | POA: Diagnosis not present

## 2020-01-19 DIAGNOSIS — N186 End stage renal disease: Secondary | ICD-10-CM | POA: Diagnosis not present

## 2020-01-19 NOTE — Telephone Encounter (Deleted)
Please advise him that his CT for his lungs showed that he has some air in his abdomen. I would advise him to go to the ER to be evaluated for this. Thanks.

## 2020-01-19 NOTE — Telephone Encounter (Signed)
Notified patient's wife of LDCT lung cancer screening program results with recommendation for 12 month follow up imaging. Also notified of incidental findings noted below (especially the finding related to potential dialysis catheter issue) and is encouraged to discuss further with PCP who will receive a copy of this note and/or the CT report. Patient's wife verbalizes understanding. Wife is aware that patient will require further evaluation of free air finding and will contact Dr. Elwyn Lade office tomorrow if has not received a call today.   Upper Abdomen: Small amount of free air in the upper abdomen. Visualized portions of the liver, gallbladder, adrenal glands and kidneys are unremarkable. Spleen appears enlarged but is incompletely imaged. Visualized portions of the pancreas, stomach and bowel are otherwise unremarkable.  Musculoskeletal: Mild degenerative changes in the spine. No worrisome lytic or sclerotic lesions.  IMPRESSION: 1. Lung-RADS 1, negative. Continue annual screening with low-dose chest CT without contrast in 12 months. 2. Incidental pneumoperitoneum. When compared with CT abdomen pelvis 12/15/2018, findings may be iatrogenic related to a peritoneal dialysis catheter. Please correlate clinically. Critical Value/emergent results were called by telephone at the time of interpretation on 01/18/2020 at 11:25 am to provider JENNIFER BURNS , who verbally acknowledged these results. 3. Spleen appears enlarged but is incompletely imaged. 4. Aortic atherosclerosis (ICD10-I70.0). Coronary artery calcification.

## 2020-01-19 NOTE — Telephone Encounter (Signed)
°  Community Resource Referral   [place] 01/19/2020    Name: James Arnold    MRN: [id number]    DOB: [birth year]    AGE: 64 y.o.    GENDER: male    PCP Park Liter P, DO.   Called pt's wife regarding [employer] Referral for financial assistance. She stated that she already sent the email to me yesterday and I did not get it asked that she re-send.  Will follow up on 4/30 if not received.  [place] Management [email or web address]   [phone]

## 2020-01-19 NOTE — Telephone Encounter (Signed)
Normal finding per Dr. Holley Raring. Nothing to worry about.

## 2020-01-20 DIAGNOSIS — Z992 Dependence on renal dialysis: Secondary | ICD-10-CM | POA: Diagnosis not present

## 2020-01-20 DIAGNOSIS — N186 End stage renal disease: Secondary | ICD-10-CM | POA: Diagnosis not present

## 2020-01-22 DIAGNOSIS — N186 End stage renal disease: Secondary | ICD-10-CM | POA: Diagnosis not present

## 2020-01-22 DIAGNOSIS — Z23 Encounter for immunization: Secondary | ICD-10-CM | POA: Diagnosis not present

## 2020-01-22 DIAGNOSIS — Z992 Dependence on renal dialysis: Secondary | ICD-10-CM | POA: Diagnosis not present

## 2020-01-23 DIAGNOSIS — Z23 Encounter for immunization: Secondary | ICD-10-CM | POA: Diagnosis not present

## 2020-01-23 DIAGNOSIS — Z992 Dependence on renal dialysis: Secondary | ICD-10-CM | POA: Diagnosis not present

## 2020-01-23 DIAGNOSIS — N186 End stage renal disease: Secondary | ICD-10-CM | POA: Diagnosis not present

## 2020-01-23 NOTE — Telephone Encounter (Signed)
Email to pt's wife  From: [name] [facility])  Sent: Monday, Jan 23, 2020 10:30 AM To: [email or web address] Subject: Secure  Good Morning Ms. Alferd Apa, Per our phone conversation please reply back to this email with the [employer] attachment. Could you call me when you do and let me know? Thank you,

## 2020-01-23 NOTE — Telephone Encounter (Signed)
  Community Resource Referral   [place] 01/23/2020  Name: James Arnold   MRN: [id number]   DOB: 1956/01/18   AGE: 64 y.o.   GENDER: male   PCP Park Liter P, DO.   Called pt regarding [employer] Referral, spoke to wife and she said that she did send the attachment she asked that I re-send her an email and she will respond back to it with attachment of bill.  [place] . [email or web address]  [phone]

## 2020-01-24 DIAGNOSIS — N186 End stage renal disease: Secondary | ICD-10-CM | POA: Diagnosis not present

## 2020-01-24 DIAGNOSIS — Z992 Dependence on renal dialysis: Secondary | ICD-10-CM | POA: Diagnosis not present

## 2020-01-24 DIAGNOSIS — Z23 Encounter for immunization: Secondary | ICD-10-CM | POA: Diagnosis not present

## 2020-01-25 DIAGNOSIS — Z992 Dependence on renal dialysis: Secondary | ICD-10-CM | POA: Diagnosis not present

## 2020-01-25 DIAGNOSIS — N186 End stage renal disease: Secondary | ICD-10-CM | POA: Diagnosis not present

## 2020-01-25 DIAGNOSIS — Z23 Encounter for immunization: Secondary | ICD-10-CM | POA: Diagnosis not present

## 2020-01-25 NOTE — Telephone Encounter (Signed)
No answer from wife after multiple attempts Closing pending further needs of pt knb

## 2020-01-26 DIAGNOSIS — N186 End stage renal disease: Secondary | ICD-10-CM | POA: Diagnosis not present

## 2020-01-26 DIAGNOSIS — Z23 Encounter for immunization: Secondary | ICD-10-CM | POA: Diagnosis not present

## 2020-01-26 DIAGNOSIS — Z992 Dependence on renal dialysis: Secondary | ICD-10-CM | POA: Diagnosis not present

## 2020-01-26 MED FILL — JAKAFI 10 MG TABLET: 10 | 30 days supply | Qty: 30 | Fill #6

## 2020-01-27 DIAGNOSIS — Z23 Encounter for immunization: Secondary | ICD-10-CM | POA: Diagnosis not present

## 2020-01-27 DIAGNOSIS — N186 End stage renal disease: Secondary | ICD-10-CM | POA: Diagnosis not present

## 2020-01-27 DIAGNOSIS — Z992 Dependence on renal dialysis: Secondary | ICD-10-CM | POA: Diagnosis not present

## 2020-01-29 DIAGNOSIS — Z992 Dependence on renal dialysis: Secondary | ICD-10-CM | POA: Diagnosis not present

## 2020-01-29 DIAGNOSIS — N186 End stage renal disease: Secondary | ICD-10-CM | POA: Diagnosis not present

## 2020-01-29 DIAGNOSIS — Z23 Encounter for immunization: Secondary | ICD-10-CM | POA: Diagnosis not present

## 2020-01-30 DIAGNOSIS — N186 End stage renal disease: Secondary | ICD-10-CM | POA: Diagnosis not present

## 2020-01-30 DIAGNOSIS — Z23 Encounter for immunization: Secondary | ICD-10-CM | POA: Diagnosis not present

## 2020-01-30 DIAGNOSIS — Z992 Dependence on renal dialysis: Secondary | ICD-10-CM | POA: Diagnosis not present

## 2020-01-31 DIAGNOSIS — Z23 Encounter for immunization: Secondary | ICD-10-CM | POA: Diagnosis not present

## 2020-01-31 DIAGNOSIS — N186 End stage renal disease: Secondary | ICD-10-CM | POA: Diagnosis not present

## 2020-01-31 DIAGNOSIS — Z992 Dependence on renal dialysis: Secondary | ICD-10-CM | POA: Diagnosis not present

## 2020-02-01 DIAGNOSIS — N186 End stage renal disease: Secondary | ICD-10-CM | POA: Diagnosis not present

## 2020-02-01 DIAGNOSIS — Z992 Dependence on renal dialysis: Secondary | ICD-10-CM | POA: Diagnosis not present

## 2020-02-01 DIAGNOSIS — Z23 Encounter for immunization: Secondary | ICD-10-CM | POA: Diagnosis not present

## 2020-02-02 DIAGNOSIS — N186 End stage renal disease: Secondary | ICD-10-CM | POA: Diagnosis not present

## 2020-02-02 DIAGNOSIS — Z23 Encounter for immunization: Secondary | ICD-10-CM | POA: Diagnosis not present

## 2020-02-02 DIAGNOSIS — Z992 Dependence on renal dialysis: Secondary | ICD-10-CM | POA: Diagnosis not present

## 2020-02-03 DIAGNOSIS — Z23 Encounter for immunization: Secondary | ICD-10-CM | POA: Diagnosis not present

## 2020-02-03 DIAGNOSIS — N186 End stage renal disease: Secondary | ICD-10-CM | POA: Diagnosis not present

## 2020-02-03 DIAGNOSIS — Z992 Dependence on renal dialysis: Secondary | ICD-10-CM | POA: Diagnosis not present

## 2020-02-05 DIAGNOSIS — Z23 Encounter for immunization: Secondary | ICD-10-CM | POA: Diagnosis not present

## 2020-02-05 DIAGNOSIS — Z992 Dependence on renal dialysis: Secondary | ICD-10-CM | POA: Diagnosis not present

## 2020-02-05 DIAGNOSIS — N186 End stage renal disease: Secondary | ICD-10-CM | POA: Diagnosis not present

## 2020-02-06 ENCOUNTER — Inpatient Hospital Stay: Payer: Medicare HMO | Attending: Internal Medicine

## 2020-02-06 ENCOUNTER — Other Ambulatory Visit: Payer: Self-pay

## 2020-02-06 ENCOUNTER — Encounter: Payer: Self-pay | Admitting: Internal Medicine

## 2020-02-06 ENCOUNTER — Inpatient Hospital Stay (HOSPITAL_BASED_OUTPATIENT_CLINIC_OR_DEPARTMENT_OTHER): Payer: Medicare HMO | Admitting: Internal Medicine

## 2020-02-06 DIAGNOSIS — N186 End stage renal disease: Secondary | ICD-10-CM | POA: Diagnosis not present

## 2020-02-06 DIAGNOSIS — Z801 Family history of malignant neoplasm of trachea, bronchus and lung: Secondary | ICD-10-CM | POA: Diagnosis not present

## 2020-02-06 DIAGNOSIS — D471 Chronic myeloproliferative disease: Secondary | ICD-10-CM

## 2020-02-06 DIAGNOSIS — Z7952 Long term (current) use of systemic steroids: Secondary | ICD-10-CM | POA: Diagnosis not present

## 2020-02-06 DIAGNOSIS — Z808 Family history of malignant neoplasm of other organs or systems: Secondary | ICD-10-CM | POA: Diagnosis not present

## 2020-02-06 DIAGNOSIS — Z992 Dependence on renal dialysis: Secondary | ICD-10-CM | POA: Diagnosis not present

## 2020-02-06 DIAGNOSIS — Z23 Encounter for immunization: Secondary | ICD-10-CM | POA: Diagnosis not present

## 2020-02-06 DIAGNOSIS — Z803 Family history of malignant neoplasm of breast: Secondary | ICD-10-CM | POA: Insufficient documentation

## 2020-02-06 DIAGNOSIS — Z8041 Family history of malignant neoplasm of ovary: Secondary | ICD-10-CM | POA: Insufficient documentation

## 2020-02-06 DIAGNOSIS — D631 Anemia in chronic kidney disease: Secondary | ICD-10-CM | POA: Insufficient documentation

## 2020-02-06 DIAGNOSIS — Z79899 Other long term (current) drug therapy: Secondary | ICD-10-CM | POA: Diagnosis not present

## 2020-02-06 DIAGNOSIS — R161 Splenomegaly, not elsewhere classified: Secondary | ICD-10-CM | POA: Insufficient documentation

## 2020-02-06 DIAGNOSIS — N185 Chronic kidney disease, stage 5: Secondary | ICD-10-CM | POA: Insufficient documentation

## 2020-02-06 DIAGNOSIS — F17210 Nicotine dependence, cigarettes, uncomplicated: Secondary | ICD-10-CM | POA: Insufficient documentation

## 2020-02-06 LAB — CBC WITH DIFFERENTIAL/PLATELET
Abs Immature Granulocytes: 0.02 10*3/uL (ref 0.00–0.07)
Basophils Absolute: 0 10*3/uL (ref 0.0–0.1)
Basophils Relative: 0 %
Eosinophils Absolute: 0 10*3/uL (ref 0.0–0.5)
Eosinophils Relative: 0 %
HCT: 31.7 % — ABNORMAL LOW (ref 39.0–52.0)
Hemoglobin: 10.4 g/dL — ABNORMAL LOW (ref 13.0–17.0)
Immature Granulocytes: 0 %
Lymphocytes Relative: 10 %
Lymphs Abs: 0.5 10*3/uL — ABNORMAL LOW (ref 0.7–4.0)
MCH: 30.1 pg (ref 26.0–34.0)
MCHC: 32.8 g/dL (ref 30.0–36.0)
MCV: 91.9 fL (ref 80.0–100.0)
Monocytes Absolute: 0.1 10*3/uL (ref 0.1–1.0)
Monocytes Relative: 3 %
Neutro Abs: 4.1 10*3/uL (ref 1.7–7.7)
Neutrophils Relative %: 87 %
Platelets: 114 10*3/uL — ABNORMAL LOW (ref 150–400)
RBC: 3.45 MIL/uL — ABNORMAL LOW (ref 4.22–5.81)
RDW: 17.7 % — ABNORMAL HIGH (ref 11.5–15.5)
WBC: 4.7 10*3/uL (ref 4.0–10.5)
nRBC: 0 % (ref 0.0–0.2)

## 2020-02-06 LAB — COMPREHENSIVE METABOLIC PANEL
ALT: 21 U/L (ref 0–44)
AST: 18 U/L (ref 15–41)
Albumin: 3.9 g/dL (ref 3.5–5.0)
Alkaline Phosphatase: 40 U/L (ref 38–126)
Anion gap: 9 (ref 5–15)
BUN: 48 mg/dL — ABNORMAL HIGH (ref 8–23)
CO2: 24 mmol/L (ref 22–32)
Calcium: 8.6 mg/dL — ABNORMAL LOW (ref 8.9–10.3)
Chloride: 102 mmol/L (ref 98–111)
Creatinine, Ser: 4.28 mg/dL — ABNORMAL HIGH (ref 0.61–1.24)
GFR calc Af Amer: 16 mL/min — ABNORMAL LOW (ref >60)
GFR calc non Af Amer: 14 mL/min — ABNORMAL LOW (ref >60)
Glucose, Bld: 106 mg/dL — ABNORMAL HIGH (ref 70–99)
Potassium: 5.3 mmol/L — ABNORMAL HIGH (ref 3.5–5.1)
Sodium: 135 mmol/L (ref 135–145)
Total Bilirubin: 0.9 mg/dL (ref 0.3–1.2)
Total Protein: 6.6 g/dL (ref 6.5–8.1)

## 2020-02-06 LAB — LACTATE DEHYDROGENASE: LDH: 542 U/L — ABNORMAL HIGH (ref 98–192)

## 2020-02-06 LAB — PROTIME-INR
INR: 1.1 (ref 0.8–1.2)
Prothrombin Time: 13.6 seconds (ref 11.4–15.2)

## 2020-02-06 LAB — APTT: aPTT: 26 seconds (ref 24–36)

## 2020-02-06 NOTE — Assessment & Plan Note (Addendum)
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  Discussed that splenomegaly mildly increased compared to July last year.  However limited treatment options for the patient.  We will currently continue Jakafi 10 mg a day.  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  Hemoglobin 11. STABLE.   #CKD stage IV-V [Dr. Latif]  on PD; STABLE-  #Antimicrobial prophylaxis-acyclovir 200 mg twice a day. STABLE.   # DISPOSITION:labs copy # follow up in 2 months-labs- MD-CBC/CMLLDH; -Dr.B    Cc; Dr.[name] /Dr.Johnson

## 2020-02-06 NOTE — Progress Notes (Signed)
[facility] OFFICE PROGRESS NOTE  Patient Care Team: Valerie Roys, DO as PCP - General (Family Medicine) End, Harrell Gave, MD as PCP - Cardiology (Cardiology) Cammie Sickle, MD as Consulting Physician (Internal Medicine)  Cancer Staging No matching staging information was found for the patient.   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   # AUGUST 1st week 2019- Retacrit   # October 2nd 2019- jakafi 10 BID [? 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 [name]- kidney Bx- FSG [ on Prednisone Dr.[name]]; July 2020-peritoneal dialysis;   # hx of Lung nodules- resolved [[name]]/quit smoking.  DIAGNOSIS: PRIMARY MYELOFIBROSIS  RISK:LOW     ;GOALS: Control  CURRENT/MOST RECENT THERAPY : Jakafi     Primary myelofibrosis ([place])      INTERVAL HISTORY:  James Arnold 64 y.o.  male pleasant patient above history of primary myelofibrosis Jak 2+ currently on Jakafi 10 milligrams once a day [name] dosed/peritoneal dialysis] and also chronic kidney disease/FSG on prednisone is here for follow-up.   Patient continues to undergo dialysis/peritoneal dialysis at home.  Denies any infectious complications.  Chronic mild fatigue.  Not no worse.  No swelling in the legs.  No rash.  No significant weight loss.  Review of Systems  Constitutional: Positive for malaise/fatigue. Negative for chills, diaphoresis, fever and weight loss.  HENT: Negative for nosebleeds and sore throat.   Eyes: Negative for double vision.  Respiratory: Positive for shortness of breath. Negative for cough,  hemoptysis, sputum production and wheezing.   Cardiovascular: Negative for chest pain, palpitations and orthopnea.  Gastrointestinal: Negative for abdominal pain, blood in stool, constipation, diarrhea, heartburn, melena, nausea and vomiting.  Genitourinary: Negative for dysuria, frequency and urgency.  Musculoskeletal: Negative for back pain and joint pain.  Skin: Negative.  Negative for itching and rash.  Neurological: Negative for dizziness, tingling, focal weakness, weakness and headaches.  Endo/Heme/Allergies: Does not bruise/bleed easily.  Psychiatric/Behavioral: Negative for depression. The patient is not nervous/anxious and does not have insomnia.       PAST MEDICAL HISTORY :  Past Medical History:  Diagnosis Date  . Acute renal failure (ARF) ([place]) 05/15/2017  . Anemia   . Benign hypertensive renal disease   . CAD (coronary artery disease)    a. 08/1996 s/p BMS to the mLAD ([facility]); b. 12/2017 MV: EF 46%, fixed apical defect w/ significant GI uptake artifact. No ischemia-> low risk.  . CKD (chronic kidney disease), stage IV ([place])   . Depression   . Diastolic dysfunction    a. 12/2017 Echo: EF 60-65%, no rwma, Gr1 DD, mild AI/MR. Nl RVSP.  [name] Kitchen Dyspnea    with exertion  . FSGS (focal segmental glomerulosclerosis)   . Hyperlipidemia 10/2002  . Hypertension   . Myelofibrosis ([place]) 2010  . Smoker   . Thrombocytopenia ([place])     PAST SURGICAL HISTORY :   Past Surgical History:  Procedure Laterality Date  . ANGIOPLASTY    . BONE MARROW ASPIRATION  03/2009  . CARDIAC CATHETERIZATION    . COLONOSCOPY WITH PROPOFOL N/A 08/09/2018   Procedure: COLONOSCOPY WITH PROPOFOL;  Surgeon: [name], MD;  Location[facility];  Service: Gastroenterology;  Laterality: N/A;  . CORONARY ARTERY BYPASS GRAFT    . CORONARY STENT PLACEMENT  08/1996    FAMILY HISTORY :   Family History  Problem Relation Age of Onset  . Stroke Mother        Low BP stroke  . Depression Father   . Depression  Sister        Breast  . Heart disease Other   . Breast cancer Other   . Lung cancer Other   . Ovarian cancer Other   . Stomach cancer Other     SOCIAL HISTORY:   Social History   Tobacco Use  . Smoking status: Current Every Day Smoker    Packs/day: 1.50    Years: 47.00    Pack years: 70.50    Types: Cigarettes  . Smokeless tobacco: Never Used  Substance Use Topics  . Alcohol use: No    Alcohol/week: 0.0 standard drinks    Comment: patient states he has not consumed alcohol in the last month  . Drug use: No    ALLERGIES:  has No Known Allergies.  MEDICATIONS:  Current Outpatient Medications  Medication Sig Dispense Refill  . acetaminophen (TYLENOL) 500 MG tablet Take 1,000 mg by mouth every 6 (six) hours as needed.    [name] Kitchen acyclovir (ZOVIRAX) 200 MG capsule Take 1 capsule (200 mg total) by mouth 2 (two) times daily. 60 capsule 6  . amLODipine (NORVASC) 10 MG tablet Take 1 tablet (10 mg total) by mouth daily. 90 tablet 1  . aspirin EC 81 MG tablet Take by mouth.    . carvedilol (COREG) 12.5 MG tablet Take 1 tablet (12.5 mg total) by mouth 2 (two) times daily. 180 tablet 1  . Cholecalciferol (VITAMIN D-1000 MAX ST) 25 MCG (1000 UT) tablet Take 1,000 Units by mouth daily.     . citalopram (CELEXA) 40 MG tablet Take 1 tablet (40 mg total) by mouth daily. 90 tablet 1  . epoetin alfa-epbx (RETACRIT) 51884 UNIT/ML injection Inject into the skin.    . ferrous sulfate 325 (65 FE) MG tablet Take 1 tablet (325 mg total) by mouth daily with breakfast. 30 tablet 1  . furosemide (LASIX) 20 MG tablet Take 1 tablet (20 mg total) by mouth daily. 90 tablet 1  . gentamicin cream (GARAMYCIN) 0.1 % APP  CATHETER EXIT SITE D    . losartan (COZAAR) 50 MG tablet Take 1 tablet (50 mg total) by mouth daily. 90 tablet 1  . predniSONE (DELTASONE) 10 MG tablet Take 10 mg by mouth daily with breakfast.    . ruxolitinib phosphate (JAKAFI) 10 MG tablet Take 1 tablet (10 mg total) by mouth daily. 60 tablet 4   . traMADol (ULTRAM) 50 MG tablet Take 1 tablet (50 mg total) by mouth every 8 (eight) hours as needed. 21 tablet 0   No current facility-administered medications for this visit.    PHYSICAL EXAMINATION: ECOG PERFORMANCE STATUS: 1 - Symptomatic but completely ambulatory  BP 135/81 (BP Location: Left Arm, Patient Position: Sitting, Cuff Size: Normal)   Pulse 63   Temp (!) 96.8 F (36 C) (Tympanic)   Resp 20   Wt 182 lb 6.4 oz (82.7 kg)   SpO2 98%   BMI 28.57 kg/m   Filed Weights   02/06/20 1049  Weight: 182 lb 6.4 oz (82.7 kg)    Physical Exam  Constitutional: He is oriented to person, place, and time and well-developed, well-nourished, and in no distress.  He is alone.  HENT:  Head: Normocephalic and atraumatic.  Mouth/Throat: Oropharynx is clear and moist. No oropharyngeal exudate.  Eyes: Pupils are equal, round, and reactive to light.  Cardiovascular: Normal rate and regular rhythm.  Pulmonary/Chest: No respiratory distress. He has no wheezes.  Decreased air entry bilaterally.  Abdominal: Soft. Bowel sounds are normal. He exhibits no distension and no mass. There is no abdominal tenderness. There is no rebound and no guarding.  Musculoskeletal:        General: No tenderness or edema. Normal range of motion.     Cervical back: Normal range of motion and neck supple.  Neurological: He is alert and oriented to person, place, and time.  Skin: Skin is warm.  Psychiatric: Affect normal.    LABORATORY DATA:  I have reviewed the data as listed    Component Value Date/Time   NA 135 02/06/2020 0957   NA 138 01/09/2020 0812   K 5.3 (H) 02/06/2020 0957   CL 102 02/06/2020 0957   CO2 24 02/06/2020 0957   GLUCOSE 106 (H) 02/06/2020 0957   BUN 48 (H) 02/06/2020 0957   BUN 52 (H) 01/09/2020 0812   CREATININE 4.28 (H) 02/06/2020 0957   CREATININE 1.12 09/30/2013 0953   CALCIUM 8.6 (L) 02/06/2020 0957   PROT 6.6 02/06/2020 0957   PROT 6.3 01/09/2020 0812   ALBUMIN 3.9  02/06/2020 0957   ALBUMIN 4.3 01/09/2020 0812   AST 18 02/06/2020 0957   ALT 21 02/06/2020 0957   ALKPHOS 40 02/06/2020 0957   BILITOT 0.9 02/06/2020 0957   BILITOT 0.4 01/09/2020 0812   GFRNONAA 14 (L) 02/06/2020 0957   GFRNONAA >60 09/30/2013 0953   GFRAA 16 (L) 02/06/2020 0957   GFRAA >60 09/30/2013 0953    No results found for: SPEP, UPEP  Lab Results  Component Value Date   WBC 4.7 02/06/2020   NEUTROABS 4.1 02/06/2020   HGB 10.4 (L) 02/06/2020   HCT 31.7 (L) 02/06/2020   MCV 91.9 02/06/2020   PLT 114 (L) 02/06/2020      Chemistry      Component Value Date/Time   NA 135 02/06/2020 0957   NA 138 01/09/2020 0812   K 5.3 (H) 02/06/2020 0957   CL 102 02/06/2020 0957   CO2 24 02/06/2020 0957   BUN 48 (H) 02/06/2020 0957   BUN 52 (H) 01/09/2020 0812   CREATININE 4.28 (H) 02/06/2020 0957   CREATININE 1.12 09/30/2013 0953      Component Value Date/Time   CALCIUM 8.6 (L) 02/06/2020 0957   ALKPHOS 40 02/06/2020 0957   AST 18 02/06/2020 0957   ALT 21 02/06/2020 0957   BILITOT 0.9 02/06/2020 0957   BILITOT 0.4 01/09/2020 0812       RADIOGRAPHIC STUDIES: I have personally reviewed the radiological images as listed and agreed with the findings in the report. No results found.   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  Discussed that splenomegaly mildly increased compared to July last year.  However limited treatment options for the patient.  We will currently continue Jakafi 10 mg a day.  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  Hemoglobin 11. STABLE.   #CKD stage  IV-V [Dr. Latif]  on PD;  STABLE-  #Antimicrobial prophylaxis-acyclovir 200 mg twice a day. STABLE.   # DISPOSITION:labs copy # follow up in 2 months-labs- MD-CBC/CMLLDH; -Dr.B    Cc; Dr.[name] /Dr.Johnson   No orders of the defined types were placed in this encounter.  All questions were answered. The patient knows to call the clinic with any problems, questions or concerns.     Cammie Sickle, MD 02/06/2020 3:33 PM

## 2020-02-06 NOTE — Progress Notes (Signed)
Patient here today for follow up. Denies any concerns, just stated he is having some left shoulder pain. Rates pain at level 5.

## 2020-02-07 DIAGNOSIS — Z992 Dependence on renal dialysis: Secondary | ICD-10-CM | POA: Diagnosis not present

## 2020-02-07 DIAGNOSIS — Z23 Encounter for immunization: Secondary | ICD-10-CM | POA: Diagnosis not present

## 2020-02-07 DIAGNOSIS — N186 End stage renal disease: Secondary | ICD-10-CM | POA: Diagnosis not present

## 2020-02-08 DIAGNOSIS — N186 End stage renal disease: Secondary | ICD-10-CM | POA: Diagnosis not present

## 2020-02-08 DIAGNOSIS — Z992 Dependence on renal dialysis: Secondary | ICD-10-CM | POA: Diagnosis not present

## 2020-02-08 DIAGNOSIS — Z23 Encounter for immunization: Secondary | ICD-10-CM | POA: Diagnosis not present

## 2020-02-09 DIAGNOSIS — N186 End stage renal disease: Secondary | ICD-10-CM | POA: Diagnosis not present

## 2020-02-09 DIAGNOSIS — Z23 Encounter for immunization: Secondary | ICD-10-CM | POA: Diagnosis not present

## 2020-02-09 DIAGNOSIS — Z992 Dependence on renal dialysis: Secondary | ICD-10-CM | POA: Diagnosis not present

## 2020-02-10 DIAGNOSIS — Z23 Encounter for immunization: Secondary | ICD-10-CM | POA: Diagnosis not present

## 2020-02-10 DIAGNOSIS — N186 End stage renal disease: Secondary | ICD-10-CM | POA: Diagnosis not present

## 2020-02-10 DIAGNOSIS — Z992 Dependence on renal dialysis: Secondary | ICD-10-CM | POA: Diagnosis not present

## 2020-02-12 DIAGNOSIS — Z992 Dependence on renal dialysis: Secondary | ICD-10-CM | POA: Diagnosis not present

## 2020-02-12 DIAGNOSIS — N186 End stage renal disease: Secondary | ICD-10-CM | POA: Diagnosis not present

## 2020-02-12 DIAGNOSIS — Z23 Encounter for immunization: Secondary | ICD-10-CM | POA: Diagnosis not present

## 2020-02-13 DIAGNOSIS — Z992 Dependence on renal dialysis: Secondary | ICD-10-CM | POA: Diagnosis not present

## 2020-02-13 DIAGNOSIS — Z23 Encounter for immunization: Secondary | ICD-10-CM | POA: Diagnosis not present

## 2020-02-13 DIAGNOSIS — N186 End stage renal disease: Secondary | ICD-10-CM | POA: Diagnosis not present

## 2020-02-14 DIAGNOSIS — Z992 Dependence on renal dialysis: Secondary | ICD-10-CM | POA: Diagnosis not present

## 2020-02-14 DIAGNOSIS — Z23 Encounter for immunization: Secondary | ICD-10-CM | POA: Diagnosis not present

## 2020-02-14 DIAGNOSIS — N186 End stage renal disease: Secondary | ICD-10-CM | POA: Diagnosis not present

## 2020-02-15 DIAGNOSIS — N186 End stage renal disease: Secondary | ICD-10-CM | POA: Diagnosis not present

## 2020-02-15 DIAGNOSIS — Z992 Dependence on renal dialysis: Secondary | ICD-10-CM | POA: Diagnosis not present

## 2020-02-15 DIAGNOSIS — Z23 Encounter for immunization: Secondary | ICD-10-CM | POA: Diagnosis not present

## 2020-02-16 DIAGNOSIS — N186 End stage renal disease: Secondary | ICD-10-CM | POA: Diagnosis not present

## 2020-02-16 DIAGNOSIS — Z23 Encounter for immunization: Secondary | ICD-10-CM | POA: Diagnosis not present

## 2020-02-16 DIAGNOSIS — Z992 Dependence on renal dialysis: Secondary | ICD-10-CM | POA: Diagnosis not present

## 2020-02-17 DIAGNOSIS — Z23 Encounter for immunization: Secondary | ICD-10-CM | POA: Diagnosis not present

## 2020-02-17 DIAGNOSIS — Z992 Dependence on renal dialysis: Secondary | ICD-10-CM | POA: Diagnosis not present

## 2020-02-17 DIAGNOSIS — N186 End stage renal disease: Secondary | ICD-10-CM | POA: Diagnosis not present

## 2020-02-19 DIAGNOSIS — N186 End stage renal disease: Secondary | ICD-10-CM | POA: Diagnosis not present

## 2020-02-19 DIAGNOSIS — Z23 Encounter for immunization: Secondary | ICD-10-CM | POA: Diagnosis not present

## 2020-02-19 DIAGNOSIS — Z992 Dependence on renal dialysis: Secondary | ICD-10-CM | POA: Diagnosis not present

## 2020-02-20 DIAGNOSIS — Z23 Encounter for immunization: Secondary | ICD-10-CM | POA: Diagnosis not present

## 2020-02-20 DIAGNOSIS — N186 End stage renal disease: Secondary | ICD-10-CM | POA: Diagnosis not present

## 2020-02-20 DIAGNOSIS — Z992 Dependence on renal dialysis: Secondary | ICD-10-CM | POA: Diagnosis not present

## 2020-02-21 DIAGNOSIS — N186 End stage renal disease: Secondary | ICD-10-CM | POA: Diagnosis not present

## 2020-02-21 DIAGNOSIS — Z992 Dependence on renal dialysis: Secondary | ICD-10-CM | POA: Diagnosis not present

## 2020-02-22 DIAGNOSIS — Z992 Dependence on renal dialysis: Secondary | ICD-10-CM | POA: Diagnosis not present

## 2020-02-22 DIAGNOSIS — N186 End stage renal disease: Secondary | ICD-10-CM | POA: Diagnosis not present

## 2020-02-23 DIAGNOSIS — N186 End stage renal disease: Secondary | ICD-10-CM | POA: Diagnosis not present

## 2020-02-23 DIAGNOSIS — Z992 Dependence on renal dialysis: Secondary | ICD-10-CM | POA: Diagnosis not present

## 2020-02-23 MED FILL — JAKAFI 10 MG TABLET: 10 | 30 days supply | Qty: 30 | Fill #7

## 2020-02-24 DIAGNOSIS — Z992 Dependence on renal dialysis: Secondary | ICD-10-CM | POA: Diagnosis not present

## 2020-02-24 DIAGNOSIS — N186 End stage renal disease: Secondary | ICD-10-CM | POA: Diagnosis not present

## 2020-02-26 DIAGNOSIS — N186 End stage renal disease: Secondary | ICD-10-CM | POA: Diagnosis not present

## 2020-02-26 DIAGNOSIS — Z992 Dependence on renal dialysis: Secondary | ICD-10-CM | POA: Diagnosis not present

## 2020-02-27 DIAGNOSIS — N186 End stage renal disease: Secondary | ICD-10-CM | POA: Diagnosis not present

## 2020-02-27 DIAGNOSIS — Z992 Dependence on renal dialysis: Secondary | ICD-10-CM | POA: Diagnosis not present

## 2020-02-28 DIAGNOSIS — N186 End stage renal disease: Secondary | ICD-10-CM | POA: Diagnosis not present

## 2020-02-28 DIAGNOSIS — Z992 Dependence on renal dialysis: Secondary | ICD-10-CM | POA: Diagnosis not present

## 2020-02-29 DIAGNOSIS — Z992 Dependence on renal dialysis: Secondary | ICD-10-CM | POA: Diagnosis not present

## 2020-02-29 DIAGNOSIS — N186 End stage renal disease: Secondary | ICD-10-CM | POA: Diagnosis not present

## 2020-03-01 DIAGNOSIS — N186 End stage renal disease: Secondary | ICD-10-CM | POA: Diagnosis not present

## 2020-03-01 DIAGNOSIS — Z992 Dependence on renal dialysis: Secondary | ICD-10-CM | POA: Diagnosis not present

## 2020-03-02 DIAGNOSIS — Z992 Dependence on renal dialysis: Secondary | ICD-10-CM | POA: Diagnosis not present

## 2020-03-02 DIAGNOSIS — N186 End stage renal disease: Secondary | ICD-10-CM | POA: Diagnosis not present

## 2020-03-04 DIAGNOSIS — N186 End stage renal disease: Secondary | ICD-10-CM | POA: Diagnosis not present

## 2020-03-04 DIAGNOSIS — Z992 Dependence on renal dialysis: Secondary | ICD-10-CM | POA: Diagnosis not present

## 2020-03-05 DIAGNOSIS — N186 End stage renal disease: Secondary | ICD-10-CM | POA: Diagnosis not present

## 2020-03-05 DIAGNOSIS — Z992 Dependence on renal dialysis: Secondary | ICD-10-CM | POA: Diagnosis not present

## 2020-03-06 DIAGNOSIS — N186 End stage renal disease: Secondary | ICD-10-CM | POA: Diagnosis not present

## 2020-03-06 DIAGNOSIS — Z992 Dependence on renal dialysis: Secondary | ICD-10-CM | POA: Diagnosis not present

## 2020-03-07 DIAGNOSIS — N186 End stage renal disease: Secondary | ICD-10-CM | POA: Diagnosis not present

## 2020-03-07 DIAGNOSIS — Z992 Dependence on renal dialysis: Secondary | ICD-10-CM | POA: Diagnosis not present

## 2020-03-08 DIAGNOSIS — Z992 Dependence on renal dialysis: Secondary | ICD-10-CM | POA: Diagnosis not present

## 2020-03-08 DIAGNOSIS — N186 End stage renal disease: Secondary | ICD-10-CM | POA: Diagnosis not present

## 2020-03-09 DIAGNOSIS — Z992 Dependence on renal dialysis: Secondary | ICD-10-CM | POA: Diagnosis not present

## 2020-03-09 DIAGNOSIS — N186 End stage renal disease: Secondary | ICD-10-CM | POA: Diagnosis not present

## 2020-03-11 DIAGNOSIS — Z992 Dependence on renal dialysis: Secondary | ICD-10-CM | POA: Diagnosis not present

## 2020-03-11 DIAGNOSIS — N186 End stage renal disease: Secondary | ICD-10-CM | POA: Diagnosis not present

## 2020-03-12 DIAGNOSIS — Z992 Dependence on renal dialysis: Secondary | ICD-10-CM | POA: Diagnosis not present

## 2020-03-12 DIAGNOSIS — N186 End stage renal disease: Secondary | ICD-10-CM | POA: Diagnosis not present

## 2020-03-13 DIAGNOSIS — N186 End stage renal disease: Secondary | ICD-10-CM | POA: Diagnosis not present

## 2020-03-13 DIAGNOSIS — Z992 Dependence on renal dialysis: Secondary | ICD-10-CM | POA: Diagnosis not present

## 2020-03-14 DIAGNOSIS — N186 End stage renal disease: Secondary | ICD-10-CM | POA: Diagnosis not present

## 2020-03-14 DIAGNOSIS — Z992 Dependence on renal dialysis: Secondary | ICD-10-CM | POA: Diagnosis not present

## 2020-03-15 DIAGNOSIS — N186 End stage renal disease: Secondary | ICD-10-CM | POA: Diagnosis not present

## 2020-03-15 DIAGNOSIS — Z992 Dependence on renal dialysis: Secondary | ICD-10-CM | POA: Diagnosis not present

## 2020-03-16 ENCOUNTER — Other Ambulatory Visit: Payer: Self-pay | Admitting: Family Medicine

## 2020-03-16 DIAGNOSIS — N186 End stage renal disease: Secondary | ICD-10-CM | POA: Diagnosis not present

## 2020-03-16 DIAGNOSIS — Z992 Dependence on renal dialysis: Secondary | ICD-10-CM | POA: Diagnosis not present

## 2020-03-18 DIAGNOSIS — Z992 Dependence on renal dialysis: Secondary | ICD-10-CM | POA: Diagnosis not present

## 2020-03-18 DIAGNOSIS — N186 End stage renal disease: Secondary | ICD-10-CM | POA: Diagnosis not present

## 2020-03-19 DIAGNOSIS — N186 End stage renal disease: Secondary | ICD-10-CM | POA: Diagnosis not present

## 2020-03-19 DIAGNOSIS — Z992 Dependence on renal dialysis: Secondary | ICD-10-CM | POA: Diagnosis not present

## 2020-03-20 ENCOUNTER — Ambulatory Visit
Admission: RE | Admit: 2020-03-20 | Discharge: 2020-03-20 | Disposition: A | Discharge status: Home / Self Care | Payer: Medicare HMO | Source: Ambulatory Visit | Attending: Oncology | Admitting: Oncology

## 2020-03-20 ENCOUNTER — Other Ambulatory Visit: Payer: Self-pay

## 2020-03-20 ENCOUNTER — Inpatient Hospital Stay (HOSPITAL_BASED_OUTPATIENT_CLINIC_OR_DEPARTMENT_OTHER): Payer: Medicare HMO | Admitting: Oncology

## 2020-03-20 ENCOUNTER — Inpatient Hospital Stay: Payer: Medicare HMO | Attending: Oncology

## 2020-03-20 ENCOUNTER — Telehealth: Payer: Self-pay | Admitting: *Deleted

## 2020-03-20 VITALS — BP 144/71 | HR 66 | Temp 96.7°F | Resp 20

## 2020-03-20 DIAGNOSIS — N186 End stage renal disease: Secondary | ICD-10-CM | POA: Diagnosis not present

## 2020-03-20 DIAGNOSIS — D631 Anemia in chronic kidney disease: Secondary | ICD-10-CM | POA: Insufficient documentation

## 2020-03-20 DIAGNOSIS — R1012 Left upper quadrant pain: Secondary | ICD-10-CM

## 2020-03-20 DIAGNOSIS — D471 Chronic myeloproliferative disease: Secondary | ICD-10-CM | POA: Insufficient documentation

## 2020-03-20 DIAGNOSIS — Z79899 Other long term (current) drug therapy: Secondary | ICD-10-CM | POA: Diagnosis not present

## 2020-03-20 DIAGNOSIS — Z7952 Long term (current) use of systemic steroids: Secondary | ICD-10-CM | POA: Diagnosis not present

## 2020-03-20 DIAGNOSIS — R079 Chest pain, unspecified: Secondary | ICD-10-CM

## 2020-03-20 DIAGNOSIS — R0600 Dyspnea, unspecified: Secondary | ICD-10-CM

## 2020-03-20 DIAGNOSIS — F17210 Nicotine dependence, cigarettes, uncomplicated: Secondary | ICD-10-CM | POA: Diagnosis not present

## 2020-03-20 DIAGNOSIS — Z992 Dependence on renal dialysis: Secondary | ICD-10-CM | POA: Diagnosis not present

## 2020-03-20 DIAGNOSIS — Q600 Renal agenesis, unilateral: Secondary | ICD-10-CM | POA: Diagnosis not present

## 2020-03-20 DIAGNOSIS — R531 Weakness: Secondary | ICD-10-CM

## 2020-03-20 DIAGNOSIS — I10 Essential (primary) hypertension: Secondary | ICD-10-CM | POA: Diagnosis not present

## 2020-03-20 DIAGNOSIS — Z01818 Encounter for other preprocedural examination: Secondary | ICD-10-CM | POA: Diagnosis not present

## 2020-03-20 DIAGNOSIS — E785 Hyperlipidemia, unspecified: Secondary | ICD-10-CM | POA: Diagnosis not present

## 2020-03-20 DIAGNOSIS — F329 Major depressive disorder, single episode, unspecified: Secondary | ICD-10-CM | POA: Insufficient documentation

## 2020-03-20 DIAGNOSIS — N185 Chronic kidney disease, stage 5: Secondary | ICD-10-CM | POA: Insufficient documentation

## 2020-03-20 DIAGNOSIS — Z7982 Long term (current) use of aspirin: Secondary | ICD-10-CM | POA: Diagnosis not present

## 2020-03-20 LAB — COMPREHENSIVE METABOLIC PANEL
ALT: 25 U/L (ref 0–44)
AST: 18 U/L (ref 15–41)
Albumin: 3.9 g/dL (ref 3.5–5.0)
Alkaline Phosphatase: 38 U/L (ref 38–126)
Anion gap: 11 (ref 5–15)
BUN: 67 mg/dL — ABNORMAL HIGH (ref 8–23)
CO2: 23 mmol/L (ref 22–32)
Calcium: 8.3 mg/dL — ABNORMAL LOW (ref 8.9–10.3)
Chloride: 102 mmol/L (ref 98–111)
Creatinine, Ser: 4.53 mg/dL — ABNORMAL HIGH (ref 0.61–1.24)
GFR calc Af Amer: 15 mL/min — ABNORMAL LOW (ref >60)
GFR calc non Af Amer: 13 mL/min — ABNORMAL LOW (ref >60)
Glucose, Bld: 101 mg/dL — ABNORMAL HIGH (ref 70–99)
Potassium: 4.9 mmol/L (ref 3.5–5.1)
Sodium: 136 mmol/L (ref 135–145)
Total Bilirubin: 0.7 mg/dL (ref 0.3–1.2)
Total Protein: 6.6 g/dL (ref 6.5–8.1)

## 2020-03-20 LAB — CBC WITH DIFFERENTIAL/PLATELET
Abs Immature Granulocytes: 0.02 10*3/uL (ref 0.00–0.07)
Basophils Absolute: 0 10*3/uL (ref 0.0–0.1)
Basophils Relative: 0 %
Eosinophils Absolute: 0 10*3/uL (ref 0.0–0.5)
Eosinophils Relative: 0 %
HCT: 29.2 % — ABNORMAL LOW (ref 39.0–52.0)
Hemoglobin: 9.9 g/dL — ABNORMAL LOW (ref 13.0–17.0)
Immature Granulocytes: 1 %
Lymphocytes Relative: 10 %
Lymphs Abs: 0.4 10*3/uL — ABNORMAL LOW (ref 0.7–4.0)
MCH: 30.8 pg (ref 26.0–34.0)
MCHC: 33.9 g/dL (ref 30.0–36.0)
MCV: 91 fL (ref 80.0–100.0)
Monocytes Absolute: 0.1 10*3/uL (ref 0.1–1.0)
Monocytes Relative: 3 %
Neutro Abs: 3.6 10*3/uL (ref 1.7–7.7)
Neutrophils Relative %: 86 %
Platelets: 97 10*3/uL — ABNORMAL LOW (ref 150–400)
RBC: 3.21 MIL/uL — ABNORMAL LOW (ref 4.22–5.81)
RDW: 16.2 % — ABNORMAL HIGH (ref 11.5–15.5)
WBC: 4.2 10*3/uL (ref 4.0–10.5)
nRBC: 0.5 % — ABNORMAL HIGH (ref 0.0–0.2)

## 2020-03-20 LAB — LACTATE DEHYDROGENASE: LDH: 497 U/L — ABNORMAL HIGH (ref 98–192)

## 2020-03-20 NOTE — Progress Notes (Addendum)
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   # AUGUST 1st week 2019- Retacrit   # October 2nd 2019- jakafi 10 BID [? 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 [name]- kidney Bx- FSG [ on Prednisone Dr.[name]]; July 2020-peritoneal dialysis;   # hx of Lung nodules- resolved [[name]]/quit smoking.  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 Rogue Bussing on [place] since October 2019.   He was last seen by Dr. Yevette Edwards on 02/06/2020 for follow-up where he appeared to be doing well.  He continued peritoneal dialysis at home and was tolerating Jackafi 10 mg daily.   In the interim, patient reports feeling weak and "drained" with increased shortness of breath over the past few  days.  Patients wife states she has noticed a steady decline over the past few weeks.  He had 7-8 episodes of diarrhea on Sunday with improvement yesterday and none this morning. Did not take OTC medications for symptom relief. Consistency was liquid; no foul smell.  He also reports left middle to lower quadrant abdominal pain.  Denies any suspicious food or drink.  Reports mild nausea without vomiting. Per patient,  his appetite has declined and he has lost several pounds.  He continues to feel short of breath especially with exertion and has intermittent left-sided chest pain.  He denies fevers, cough or sputum production.    ECOG FS:2 - Symptomatic, <50% confined to bed  Review of systems- Review of Systems  Constitutional: Positive for malaise/fatigue. Negative for chills, fever and weight loss.  HENT: Negative for congestion, ear pain and tinnitus.   Eyes: Negative.  Negative for blurred vision and double vision.  Respiratory: Positive for shortness of breath. Negative for cough and sputum production.   Cardiovascular: Negative.  Negative for chest pain, palpitations and leg swelling.  Gastrointestinal: Negative.  Negative for abdominal pain, constipation, diarrhea, nausea and vomiting.  Genitourinary: Negative for dysuria, frequency and urgency.  Musculoskeletal: Negative for back pain and falls.  Skin: Negative.  Negative for rash.  Neurological: Positive for weakness. Negative for headaches.  Endo/Heme/Allergies: Negative.  Does not bruise/bleed easily.  Psychiatric/Behavioral: Negative.  Negative for depression. The patient is not nervous/anxious and does not have insomnia.      Current treatment- Jackafi 10 mg daily  No Known Allergies   Past Medical History:  Diagnosis Date   Acute renal failure (ARF) ([place]) 05/15/2017   Anemia    Benign hypertensive renal  disease    CAD (coronary artery disease)    a. 08/1996 s/p BMS to the mLAD ([facility]); b. 12/2017 MV: EF 46%, fixed apical  defect w/ significant GI uptake artifact. No ischemia-> low risk.   CKD (chronic kidney disease), stage IV (HCC)    Depression    Diastolic dysfunction    a. 12/2017 Echo: EF 60-65%, no rwma, Gr1 DD, mild AI/MR. Nl RVSP.   Dyspnea    with exertion   FSGS (focal segmental glomerulosclerosis)    Hyperlipidemia 10/2002   Hypertension    Myelofibrosis ([place]) 2010   Smoker    Thrombocytopenia ([place])      Past Surgical History:  Procedure Laterality Date   ANGIOPLASTY     BONE MARROW ASPIRATION  03/2009   CARDIAC CATHETERIZATION     COLONOSCOPY WITH PROPOFOL N/A 08/09/2018   Procedure: COLONOSCOPY WITH PROPOFOL;  Surgeon: [name], MD;  Location[facility] ENDOSCOPY;  Service: Gastroenterology;  Laterality: N/A;   CORONARY ARTERY BYPASS GRAFT     CORONARY STENT PLACEMENT  08/1996    Social History   Socioeconomic History   Marital status: Married    Spouse name: Not on file   Number of children: Not on file   Years of education: Not on file   Highest education level: Not on file  Occupational History   Not on file  Tobacco Use   Smoking status: Current Every Day Smoker    Packs/day: 1.50    Years: 47.00    Pack years: 70.50    Types: Cigarettes   Smokeless tobacco: Never Used  Scientific laboratory technician Use: Never used  Substance and Sexual Activity   Alcohol use: No    Alcohol/week: 0.0 standard drinks    Comment: patient states he has not consumed alcohol in the last month   Drug use: No   Sexual activity: Yes  Other Topics Concern   Not on file  Social History Narrative   Not on file   Social Determinants of Health   Financial Resource Strain:    Difficulty of Paying Living Expenses:   Food Insecurity:    Worried About Charity fundraiser in the Last Year:    Arboriculturist in the Last Year:   Transportation Needs:    Film/video editor (Medical):    Lack of Transportation (Non-Medical):   Physical Activity:    Days of Exercise per Week:    Minutes of Exercise  per Session:   Stress:    Feeling of Stress :   Social Connections:    Frequency of Communication with Friends and Family:    Frequency of Social Gatherings with Friends and Family:    Attends Religious Services:    Active Member of Clubs or Organizations:    Attends Music therapist:    Marital Status:   Intimate Partner Violence:    Fear of Current or Ex-Partner:    Emotionally Abused:    Physically Abused:    Sexually Abused:     Family History  Problem Relation Age of Onset   Stroke Mother        Low BP stroke   Depression Father    Depression Sister        Breast   Heart disease Other    Breast cancer Other    Lung cancer Other    Ovarian cancer Other    Stomach cancer Other      Current Outpatient Medications:  acyclovir (ZOVIRAX) 200 MG capsule, Take 1 capsule (200 mg total) by mouth 2 (two) times daily., Disp: 60 capsule, Rfl: 6   amLODipine (NORVASC) 10 MG tablet, Take 1 tablet (10 mg total) by mouth daily., Disp: 90 tablet, Rfl: 1   aspirin EC 81 MG tablet, Take by mouth., Disp: , Rfl:    carvedilol (COREG) 12.5 MG tablet, Take 1 tablet (12.5 mg total) by mouth 2 (two) times daily., Disp: 180 tablet, Rfl: 1   Cholecalciferol (VITAMIN D-1000 MAX ST) 25 MCG (1000 UT) tablet, Take 1,000 Units by mouth daily. , Disp: , Rfl:    citalopram (CELEXA) 40 MG tablet, Take 1 tablet (40 mg total) by mouth daily., Disp: 90 tablet, Rfl: 1   epoetin alfa-epbx (RETACRIT) 58099 UNIT/ML injection, Inject into the skin., Disp: , Rfl:    ferrous sulfate 325 (65 FE) MG tablet, Take 1 tablet (325 mg total) by mouth daily with breakfast., Disp: 30 tablet, Rfl: 1   furosemide (LASIX) 20 MG tablet, Take 1 tablet (20 mg total) by mouth daily., Disp: 90 tablet, Rfl: 1   gentamicin cream (GARAMYCIN) 0.1 %, APP  CATHETER EXIT SITE D, Disp: , Rfl:    losartan (COZAAR) 50 MG tablet, Take 1 tablet (50 mg total) by mouth daily., Disp: 90 tablet, Rfl: 1   predniSONE (DELTASONE) 10  MG tablet, Take 10 mg by mouth daily with breakfast., Disp: , Rfl:    ruxolitinib phosphate (JAKAFI) 10 MG tablet, Take 1 tablet (10 mg total) by mouth daily., Disp: 60 tablet, Rfl: 4   acetaminophen (TYLENOL) 500 MG tablet, Take 1,000 mg by mouth every 6 (six) hours as needed. (Patient not taking: Reported on 03/20/2020), Disp: , Rfl:    traMADol (ULTRAM) 50 MG tablet, Take 1 tablet (50 mg total) by mouth every 8 (eight) hours as needed. (Patient not taking: Reported on 03/20/2020), Disp: 21 tablet, Rfl: 0  Physical exam:  Vitals:   03/20/20 1115  BP: (!) 144/71  Pulse: 66  Resp: 20  Temp: (!) 96.7 F (35.9 C)  TempSrc: Tympanic   Physical Exam Constitutional:      Appearance: Normal appearance.  HENT:     Head: Normocephalic and atraumatic.  Eyes:     Pupils: Pupils are equal, round, and reactive to light.  Cardiovascular:     Rate and Rhythm: Normal rate and regular rhythm.     Heart sounds: Normal heart sounds. No murmur heard.   Pulmonary:     Effort: Pulmonary effort is normal.     Breath sounds: Normal breath sounds. No wheezing.  Abdominal:     General: Bowel sounds are normal. There is no distension.     Palpations: Abdomen is soft.     Tenderness: There is no abdominal tenderness.  Musculoskeletal:        General: Normal range of motion.     Cervical back: Normal range of motion.  Skin:    General: Skin is warm and dry.     Findings: No rash.  Neurological:     Mental Status: He is alert and oriented to person, place, and time.  Psychiatric:        Judgment: Judgment normal.      CMP Latest Ref Rng & Units 03/20/2020  Glucose 70 - 99 mg/dL 101(H)  BUN 8 - 23 mg/dL 67(H)  Creatinine 0.61 - 1.24 mg/dL 4.53(H)  Sodium 135 - 145 mmol/L 136  Potassium 3.5 - 5.1 mmol/L 4.9  Chloride 98 - 111  mmol/L 102  CO2 22 - 32 mmol/L 23  Calcium 8.9 - 10.3 mg/dL 8.3(L)  Total Protein 6.5 - 8.1 g/dL 6.6  Total Bilirubin 0.3 - 1.2 mg/dL 0.7  Alkaline Phos 38 - 126 U/L 38    AST 15 - 41 U/L 18  ALT 0 - 44 U/L 25   CBC Latest Ref Rng & Units 03/20/2020  WBC 4.0 - 10.5 K/uL 4.2  Hemoglobin 13.0 - 17.0 g/dL 9.9(L)  Hematocrit 39 - 52 % 29.2(L)  Platelets 150 - 400 K/uL 97(L)    No images are attached to the encounter.  DG Chest 2 View  Result Date: 03/20/2020 CLINICAL DATA:  Shortness of breath, dyspnea EXAM: CHEST - 2 VIEW COMPARISON:  01/22/2018 FINDINGS: The heart size and mediastinal contours are within normal limits. Both lungs are clear. The visualized skeletal structures are unremarkable. IMPRESSION: No active cardiopulmonary disease. Electronically Signed   By: Kathreen Devoid   On: 03/20/2020 13:06   US Abdomen Complete  Result Date: 03/20/2020 CLINICAL DATA:  Upper abdominal pain, primarily left-sided EXAM: ABDOMEN ULTRASOUND COMPLETE COMPARISON:  Splenic ultrasound November 30, 2019. CT abdomen and pelvis October 10, 2013 FINDINGS: Gallbladder: No gallstones or wall thickening visualized. There is no pericholecystic fluid. No sonographic Murphy sign noted by sonographer. Common bile duct: Diameter: 4 mm. No intrahepatic, common hepatic, or common bile duct dilatation. Liver: No focal lesion identified. Within normal limits in parenchymal echogenicity. Portal vein is patent on color Doppler imaging with normal direction of blood flow towards the liver. IVC: No abnormality visualized. Pancreas: Visualized portion unremarkable. Portions of pancreas obscured by gas. Spleen: Spleen measures 13.1 x 11.0 x 5.1 cm with a measured splenic volume of 379 cubic cm. No focal splenic lesions are evident. Right Kidney: Length: 9.3 cm. Echogenicity is increased. No mass or hydronephrosis visualized. Left Kidney: Length: 9.5 cm. Echogenicity is increased. No mass or hydronephrosis visualized. Abdominal aorta: No aneurysm visualized. Other findings: No evident ascites. IMPRESSION: 1. Spleen less prominent than on prior study. No focal splenic lesions. 2. Portions of pancreas obscured  by gas. Visualized portions of pancreas appear normal. 3. Echogenic kidneys bilaterally, a finding concerning for medical renal disease. Appropriate laboratory correlation in this regard advised. No obstructing focus in either kidney. 4.  Study otherwise unremarkable. Electronically Signed   By: Lowella Grip III M.D.   On: 03/20/2020 14:01     Assessment and plan- Patient is a 64 y.o. male who presents to symptom management today for concerns of increased weakness, shortness of breath, decline in appetite and weight loss and intermittent left sided chest pain.   Symptoms are likely multifactorial. Differentials include progressive myelofibrosis, viral infection, medication induced or d/t chronic co-morbidities.  Will check lab work (CMP, CBC, LDH) to check status of kidneys and hemoglobin and any acute abnormality.    Weakness/fatigue- check labs. Stable.   Anemia- 9.9 slow decline- secondary to [place], CKD and others.   Abdominal pain- Complete abdominal ultrasound for progressive splenomegaly. U/s actually shows improvement of splenomegaly and no evidence of acute abnormality.  CKD stage V- stable. Continue dialysis.   Shortness of breath- Stat chest x-ray- negative for acute process; sob likely secondary to worsening anemia or Jakafi.   Chest pain-unclear etiology.  EKG normal sinus rhythm.  Unclear etiology of myriad of symptoms.  Does not appear to be anything acute at this time.  Could be related to [place] treatment for his myelofibrosis.  Dr. Rogue Bussing does not recommend stopping medication abruptly.  Will decrease dose to every other day dosing and he will follow up with him as scheduled in July.  Patient and wife in agreement.  Plan: Labs Stat abdominal ultrasound Stat Chest x-ray Decrease Jackafi form 10 mg daily to 10 mg every other day.  Dispostion:  RTC on 04/09/2020 for follow-up with Dr. Rogue Bussing.  Visit Diagnosis 1. Left upper quadrant abdominal pain   2.  Dyspnea, unspecified type   3. Chest pain, unspecified type     Patient expressed understanding and was in agreement with this plan. He also understands that He can call clinic at any time with any questions, concerns, or complaints.   Greater than 50% was spent in counseling and coordination of care with this patient including but not limited to discussion of the relevant topics above (See A&P) including, but not limited to diagnosis and management of acute and chronic medical conditions.   Thank you for allowing me to participate in the care of this very pleasant patient.    Jacquelin Hawking, NP [place] at [facility] Cell - [phone] Pager- [phone] 03/20/2020 2:08 PM

## 2020-03-20 NOTE — Telephone Encounter (Signed)
Wife called reporting that patient is very weak and a some shortness of breath and is asking if he can come in for evaluation today. He is eating and drinking well. Last Hgb 02/06/20 was 10.4. Please advise.

## 2020-03-20 NOTE — Telephone Encounter (Signed)
Appointment for this morning accepted, spoke with wife who said it will take 30- 45 minutes to get there.Lab informed of add on lab appointment. Orders entered for CBC, CMP per verbal order Lorretta Harp, NP

## 2020-03-20 NOTE — Progress Notes (Signed)
Patient c/o left upper quadrant pain. Patient's wife reports that patient had episodes of chest pain yesterday along with persistent Shortness of breath. sats at rest 100% RA. Patient reports nausea/ vomiting episodes on Sunday. Reports 7 loose stool on Sunday-Monday.

## 2020-03-20 NOTE — Telephone Encounter (Signed)
Hey. He certainly can be seen. I am leaving at 12 to go to [facility] so if he could come in now that would be great- Labs first please  Faythe Casa, NP 03/20/2020 9:36 AM

## 2020-03-21 DIAGNOSIS — Z992 Dependence on renal dialysis: Secondary | ICD-10-CM | POA: Diagnosis not present

## 2020-03-21 DIAGNOSIS — N186 End stage renal disease: Secondary | ICD-10-CM | POA: Diagnosis not present

## 2020-03-22 DIAGNOSIS — N186 End stage renal disease: Secondary | ICD-10-CM | POA: Diagnosis not present

## 2020-03-22 DIAGNOSIS — Z992 Dependence on renal dialysis: Secondary | ICD-10-CM | POA: Diagnosis not present

## 2020-03-22 MED FILL — JAKAFI 10 MG TABLET: 10 | 30 days supply | Qty: 30 | Fill #8

## 2020-03-23 DIAGNOSIS — Z992 Dependence on renal dialysis: Secondary | ICD-10-CM | POA: Diagnosis not present

## 2020-03-23 DIAGNOSIS — N186 End stage renal disease: Secondary | ICD-10-CM | POA: Diagnosis not present

## 2020-03-25 DIAGNOSIS — Z992 Dependence on renal dialysis: Secondary | ICD-10-CM | POA: Diagnosis not present

## 2020-03-25 DIAGNOSIS — N186 End stage renal disease: Secondary | ICD-10-CM | POA: Diagnosis not present

## 2020-03-26 DIAGNOSIS — N186 End stage renal disease: Secondary | ICD-10-CM | POA: Diagnosis not present

## 2020-03-26 DIAGNOSIS — Z992 Dependence on renal dialysis: Secondary | ICD-10-CM | POA: Diagnosis not present

## 2020-03-27 DIAGNOSIS — Z992 Dependence on renal dialysis: Secondary | ICD-10-CM | POA: Diagnosis not present

## 2020-03-27 DIAGNOSIS — N186 End stage renal disease: Secondary | ICD-10-CM | POA: Diagnosis not present

## 2020-03-28 DIAGNOSIS — N186 End stage renal disease: Secondary | ICD-10-CM | POA: Diagnosis not present

## 2020-03-28 DIAGNOSIS — Z992 Dependence on renal dialysis: Secondary | ICD-10-CM | POA: Diagnosis not present

## 2020-03-29 DIAGNOSIS — Z992 Dependence on renal dialysis: Secondary | ICD-10-CM | POA: Diagnosis not present

## 2020-03-29 DIAGNOSIS — N186 End stage renal disease: Secondary | ICD-10-CM | POA: Diagnosis not present

## 2020-03-30 DIAGNOSIS — Z992 Dependence on renal dialysis: Secondary | ICD-10-CM | POA: Diagnosis not present

## 2020-03-30 DIAGNOSIS — N186 End stage renal disease: Secondary | ICD-10-CM | POA: Diagnosis not present

## 2020-04-01 DIAGNOSIS — N186 End stage renal disease: Secondary | ICD-10-CM | POA: Diagnosis not present

## 2020-04-01 DIAGNOSIS — Z992 Dependence on renal dialysis: Secondary | ICD-10-CM | POA: Diagnosis not present

## 2020-04-02 DIAGNOSIS — N186 End stage renal disease: Secondary | ICD-10-CM | POA: Diagnosis not present

## 2020-04-02 DIAGNOSIS — Z992 Dependence on renal dialysis: Secondary | ICD-10-CM | POA: Diagnosis not present

## 2020-04-03 DIAGNOSIS — Z992 Dependence on renal dialysis: Secondary | ICD-10-CM | POA: Diagnosis not present

## 2020-04-03 DIAGNOSIS — N186 End stage renal disease: Secondary | ICD-10-CM | POA: Diagnosis not present

## 2020-04-04 DIAGNOSIS — N186 End stage renal disease: Secondary | ICD-10-CM | POA: Diagnosis not present

## 2020-04-04 DIAGNOSIS — Z992 Dependence on renal dialysis: Secondary | ICD-10-CM | POA: Diagnosis not present

## 2020-04-05 DIAGNOSIS — Z992 Dependence on renal dialysis: Secondary | ICD-10-CM | POA: Diagnosis not present

## 2020-04-05 DIAGNOSIS — N186 End stage renal disease: Secondary | ICD-10-CM | POA: Diagnosis not present

## 2020-04-06 ENCOUNTER — Other Ambulatory Visit: Payer: Self-pay

## 2020-04-06 ENCOUNTER — Encounter: Payer: Self-pay | Admitting: Internal Medicine

## 2020-04-06 DIAGNOSIS — N186 End stage renal disease: Secondary | ICD-10-CM | POA: Diagnosis not present

## 2020-04-06 DIAGNOSIS — Z992 Dependence on renal dialysis: Secondary | ICD-10-CM | POA: Diagnosis not present

## 2020-04-06 NOTE — Progress Notes (Signed)
Patient is currently holding the [facility]. Side effects resolved after stopping the jakafi.

## 2020-04-08 DIAGNOSIS — Z992 Dependence on renal dialysis: Secondary | ICD-10-CM | POA: Diagnosis not present

## 2020-04-08 DIAGNOSIS — N186 End stage renal disease: Secondary | ICD-10-CM | POA: Diagnosis not present

## 2020-04-09 ENCOUNTER — Inpatient Hospital Stay: Payer: Medicare HMO | Attending: Internal Medicine

## 2020-04-09 ENCOUNTER — Inpatient Hospital Stay (HOSPITAL_BASED_OUTPATIENT_CLINIC_OR_DEPARTMENT_OTHER): Payer: Medicare HMO | Admitting: Internal Medicine

## 2020-04-09 ENCOUNTER — Encounter: Payer: Self-pay | Admitting: Internal Medicine

## 2020-04-09 ENCOUNTER — Other Ambulatory Visit: Payer: Self-pay

## 2020-04-09 VITALS — BP 132/68 | HR 70 | Temp 98.3°F | Resp 16 | Ht 67.0 in | Wt 174.6 lb

## 2020-04-09 DIAGNOSIS — Z992 Dependence on renal dialysis: Secondary | ICD-10-CM | POA: Diagnosis not present

## 2020-04-09 DIAGNOSIS — D631 Anemia in chronic kidney disease: Secondary | ICD-10-CM | POA: Diagnosis not present

## 2020-04-09 DIAGNOSIS — F17210 Nicotine dependence, cigarettes, uncomplicated: Secondary | ICD-10-CM | POA: Diagnosis not present

## 2020-04-09 DIAGNOSIS — D471 Chronic myeloproliferative disease: Secondary | ICD-10-CM

## 2020-04-09 DIAGNOSIS — N186 End stage renal disease: Secondary | ICD-10-CM | POA: Diagnosis not present

## 2020-04-09 DIAGNOSIS — N184 Chronic kidney disease, stage 4 (severe): Secondary | ICD-10-CM | POA: Insufficient documentation

## 2020-04-09 LAB — COMPREHENSIVE METABOLIC PANEL
ALT: 16 U/L (ref 0–44)
AST: 15 U/L (ref 15–41)
Albumin: 3.6 g/dL (ref 3.5–5.0)
Alkaline Phosphatase: 48 U/L (ref 38–126)
Anion gap: 12 (ref 5–15)
BUN: 58 mg/dL — ABNORMAL HIGH (ref 8–23)
CO2: 22 mmol/L (ref 22–32)
Calcium: 8.4 mg/dL — ABNORMAL LOW (ref 8.9–10.3)
Chloride: 102 mmol/L (ref 98–111)
Creatinine, Ser: 4.53 mg/dL — ABNORMAL HIGH (ref 0.61–1.24)
GFR calc Af Amer: 15 mL/min — ABNORMAL LOW (ref >60)
GFR calc non Af Amer: 13 mL/min — ABNORMAL LOW (ref >60)
Glucose, Bld: 107 mg/dL — ABNORMAL HIGH (ref 70–99)
Potassium: 4.1 mmol/L (ref 3.5–5.1)
Sodium: 136 mmol/L (ref 135–145)
Total Bilirubin: 0.7 mg/dL (ref 0.3–1.2)
Total Protein: 6.5 g/dL (ref 6.5–8.1)

## 2020-04-09 LAB — CBC WITH DIFFERENTIAL/PLATELET
Abs Immature Granulocytes: 0.03 10*3/uL (ref 0.00–0.07)
Basophils Absolute: 0 10*3/uL (ref 0.0–0.1)
Basophils Relative: 0 %
Eosinophils Absolute: 0 10*3/uL (ref 0.0–0.5)
Eosinophils Relative: 0 %
HCT: 29.4 % — ABNORMAL LOW (ref 39.0–52.0)
Hemoglobin: 10.1 g/dL — ABNORMAL LOW (ref 13.0–17.0)
Immature Granulocytes: 1 %
Lymphocytes Relative: 9 %
Lymphs Abs: 0.4 10*3/uL — ABNORMAL LOW (ref 0.7–4.0)
MCH: 31 pg (ref 26.0–34.0)
MCHC: 34.4 g/dL (ref 30.0–36.0)
MCV: 90.2 fL (ref 80.0–100.0)
Monocytes Absolute: 0.1 10*3/uL (ref 0.1–1.0)
Monocytes Relative: 3 %
Neutro Abs: 3.6 10*3/uL (ref 1.7–7.7)
Neutrophils Relative %: 87 %
Platelets: 96 10*3/uL — ABNORMAL LOW (ref 150–400)
RBC: 3.26 MIL/uL — ABNORMAL LOW (ref 4.22–5.81)
RDW: 16.1 % — ABNORMAL HIGH (ref 11.5–15.5)
WBC: 4.2 10*3/uL (ref 4.0–10.5)
nRBC: 1 % — ABNORMAL HIGH (ref 0.0–0.2)

## 2020-04-09 LAB — LACTATE DEHYDROGENASE: LDH: 555 U/L — ABNORMAL HIGH (ref 98–192)

## 2020-04-09 NOTE — Assessment & Plan Note (Addendum)
#   Primary Myelofiborisis [Bone marrow 2010] jak-2 positive; most recently on jakafi [Jakafi 10 mg once a day in AM while on  PD. ]since October 2nd 2019.PSU-864; overall clinically stable.  # June 2021- [place]- 380 cc; July 3rd 2020- US spleen- 630 [baseline Feb 2019- 980cc];POOR TOLERANCE; STOPPED [Tapered off on July 8th ].  Recommend close surveillance with ultrasound every 4 months.  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  Hemoglobin 10; STABLE.   #CKD stage IV-V [Dr. Latif]  on PD; STABLE-  #Antimicrobial prophylaxis-acyclovir 200 mg twice a day. STABLE.; STOP when finishes the script.   # COVID vaccine: Patient is to unvaccinated to Covid-19.  Concerned about potential adverse events.  I again educated the patient/and the wife regarding the concerns for extremely high risk of infection/serious complications from the new variants.  Benefits of Covid vaccine outweigh the risk at any time; and strongly recommend COVID-19 vaccination.   # DISPOSITION: # follow up in 4 months-labs- MD-CBC/CMP;LDH; US spleen prior--Dr.B    Cc; Dr.[name] /Dr.Johnson

## 2020-04-09 NOTE — Progress Notes (Signed)
[facility] OFFICE PROGRESS NOTE  Patient Care Team: Valerie Roys, DO as PCP - General (Family Medicine) End, Harrell Gave, MD as PCP - Cardiology (Cardiology) Cammie Sickle, MD as Consulting Physician (Internal Medicine)  Cancer Staging No matching staging information was found for the patient.   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   # AUGUST 1st week 2019- Retacrit   # October 2nd 2019- jakafi 10 BID [? 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 [name]- kidney Bx- FSG [ on Prednisone Dr.[name]]; July 2020-peritoneal dialysis;   # hx of Lung nodules- resolved [[name]]/quit smoking.  DIAGNOSIS: PRIMARY MYELOFIBROSIS  RISK:LOW     ;GOALS: Control  CURRENT/MOST RECENT THERAPY : Surveillance    Primary myelofibrosis ([place])      INTERVAL HISTORY:  James Arnold 64 y.o.  male pleasant patient above history of primary myelofibrosis Jak 2+ currently on Jakafi 10 milligrams once a day [name] dosed/peritoneal dialysis] and also chronic kidney disease/FSG on prednisone is here for follow-up.   Patient tapered off Jakafi approximately 2 weeks ago-given his concerns of intolerance-of fatigue/myalgias.  Since coming off Jakafi-noted improvement of his fatigue myalgias.  Patient continues to undergo peritoneal dialysis.  Denies any infectious complications.  No early satiety no abdominal pain.  Review of Systems  Constitutional: Positive for malaise/fatigue. Negative for chills, diaphoresis, fever and weight  loss.  HENT: Negative for nosebleeds and sore throat.   Eyes: Negative for double vision.  Respiratory: Positive for shortness of breath. Negative for cough, hemoptysis, sputum production and wheezing.   Cardiovascular: Negative for chest pain, palpitations and orthopnea.  Gastrointestinal: Negative for abdominal pain, blood in stool, constipation, diarrhea, heartburn, melena, nausea and vomiting.  Genitourinary: Negative for dysuria, frequency and urgency.  Musculoskeletal: Negative for back pain and joint pain.  Skin: Negative.  Negative for itching and rash.  Neurological: Negative for dizziness, tingling, focal weakness, weakness and headaches.  Endo/Heme/Allergies: Does not bruise/bleed easily.  Psychiatric/Behavioral: Negative for depression. The patient is not nervous/anxious and does not have insomnia.       PAST MEDICAL HISTORY :  Past Medical History:  Diagnosis Date  . Acute renal failure (ARF) ([place]) 05/15/2017  . Anemia   . Benign hypertensive renal disease   . CAD (coronary artery disease)    a. 08/1996 s/p BMS to the mLAD ([facility]); b. 12/2017 MV: EF 46%, fixed apical defect w/ significant GI uptake artifact. No ischemia-> low risk.  . CKD (chronic kidney disease), stage IV ([place])   . Depression   . Diastolic dysfunction    a. 12/2017 Echo: EF 60-65%, no rwma, Gr1 DD, mild AI/MR. Nl RVSP.  [name] Kitchen Dyspnea    with exertion  . FSGS (focal segmental glomerulosclerosis)   . Hyperlipidemia 10/2002  . Hypertension   . Myelofibrosis ([place]) 2010  . Smoker   . Thrombocytopenia ([place])     PAST SURGICAL HISTORY :   Past Surgical History:  Procedure Laterality Date  . ANGIOPLASTY    . BONE MARROW ASPIRATION  03/2009  . CARDIAC CATHETERIZATION    . COLONOSCOPY WITH PROPOFOL N/A 08/09/2018   Procedure: COLONOSCOPY WITH PROPOFOL;  Surgeon: [name], MD;  Location: [facility] ENDOSCOPY;  Service: Gastroenterology;  Laterality: N/A;  . CORONARY ARTERY BYPASS GRAFT    . CORONARY STENT PLACEMENT   08/1996    FAMILY HISTORY :   Family History  Problem Relation Age of Onset  . Stroke Mother        Low BP stroke  . Depression Father   . Depression Sister        Breast  . Heart disease Other   . Breast cancer Other   . Lung cancer Other   . Ovarian cancer Other   . Stomach cancer Other     SOCIAL HISTORY:   Social History   Tobacco Use  . Smoking status: Current Every Day Smoker    Packs/day: 1.50    Years: 47.00    Pack years: 70.50    Types: Cigarettes  . Smokeless tobacco: Never Used  Vaping Use  . Vaping Use: Never used  Substance Use Topics  . Alcohol use: No    Alcohol/week: 0.0 standard drinks    Comment: patient states he has not consumed alcohol in the last month  . Drug use: No    ALLERGIES:  has No Known Allergies.  MEDICATIONS:  Current Outpatient Medications  Medication Sig Dispense Refill  . acyclovir (ZOVIRAX) 200 MG capsule Take 1 capsule (200 mg total) by mouth 2 (two) times daily. 60 capsule 6  . amLODipine (NORVASC) 10 MG tablet Take 1 tablet (10 mg total) by mouth daily. 90 tablet 1  . aspirin EC 81 MG tablet Take by mouth.    . carvedilol (COREG) 12.5 MG tablet Take 1 tablet (12.5 mg total) by mouth 2 (two) times daily. 180 tablet 1  . Cholecalciferol (VITAMIN D-1000 MAX ST) 25 MCG (1000 UT) tablet Take 1,000 Units by mouth daily.     . citalopram (CELEXA) 40 MG tablet Take 1 tablet (40 mg total) by mouth daily. 90 tablet 1  . epoetin alfa-epbx (RETACRIT) 16109 UNIT/ML injection Inject into the skin.    . ferrous sulfate 325 (65 FE) MG tablet Take 1 tablet (325 mg total) by mouth daily with breakfast. 30 tablet 1  . furosemide (LASIX) 20 MG tablet Take 1 tablet (20 mg total) by mouth daily. 90 tablet 1  . gentamicin cream (GARAMYCIN) 0.1 % APP  CATHETER EXIT SITE D    . losartan (COZAAR) 50 MG tablet Take 1 tablet (50 mg total) by mouth daily. 90 tablet 1  . predniSONE (DELTASONE) 10 MG tablet Take 10 mg by mouth daily with breakfast.     . acetaminophen (TYLENOL) 500 MG tablet Take 1,000 mg by mouth every 6 (six) hours as needed. (Patient not taking: Reported on 03/20/2020)    . ruxolitinib phosphate (JAKAFI) 10 MG tablet Take 1 tablet (10 mg total) by mouth daily. (Patient not taking: Reported on 04/06/2020) 60 tablet 4  . traMADol (ULTRAM) 50 MG tablet Take 1 tablet (50 mg total) by mouth every 8 (eight) hours as needed. (Patient not taking: Reported on 03/20/2020) 21 tablet 0   No current facility-administered medications for this visit.    PHYSICAL EXAMINATION: ECOG PERFORMANCE STATUS: 1 - Symptomatic but completely ambulatory  BP 132/68 (BP Location: Right Arm, Patient Position: Sitting, Cuff Size: Normal)   Pulse 70   Temp 98.3 F (36.8 C) (Tympanic)   Resp 16   Ht '5\' 7"'  (1.702 m)   Wt 174 lb 9.6 oz (79.2 kg)   SpO2 98%   BMI  27.35 kg/m   Filed Weights   04/09/20 0817  Weight: 174 lb 9.6 oz (79.2 kg)    Physical Exam Constitutional:      Comments: He is alone.   HENT:     Head: Normocephalic and atraumatic.     Mouth/Throat:     Pharynx: No oropharyngeal exudate.  Eyes:     Pupils: Pupils are equal, round, and reactive to light.  Cardiovascular:     Rate and Rhythm: Normal rate and regular rhythm.  Pulmonary:     Effort: No respiratory distress.     Breath sounds: No wheezing.  Abdominal:     General: Bowel sounds are normal. There is no distension.     Palpations: Abdomen is soft. There is no mass.     Tenderness: There is no abdominal tenderness. There is no guarding or rebound.  Musculoskeletal:        General: No tenderness. Normal range of motion.     Cervical back: Normal range of motion and neck supple.  Skin:    General: Skin is warm.  Neurological:     Mental Status: He is alert and oriented to person, place, and time.  Psychiatric:        Mood and Affect: Affect normal.     LABORATORY DATA:  I have reviewed the data as listed    Component Value Date/Time   NA 136 04/09/2020  0800   NA 138 01/09/2020 0812   K 4.1 04/09/2020 0800   CL 102 04/09/2020 0800   CO2 22 04/09/2020 0800   GLUCOSE 107 (H) 04/09/2020 0800   BUN 58 (H) 04/09/2020 0800   BUN 52 (H) 01/09/2020 0812   CREATININE 4.53 (H) 04/09/2020 0800   CREATININE 1.12 09/30/2013 0953   CALCIUM 8.4 (L) 04/09/2020 0800   PROT 6.5 04/09/2020 0800   PROT 6.3 01/09/2020 0812   ALBUMIN 3.6 04/09/2020 0800   ALBUMIN 4.3 01/09/2020 0812   AST 15 04/09/2020 0800   ALT 16 04/09/2020 0800   ALKPHOS 48 04/09/2020 0800   BILITOT 0.7 04/09/2020 0800   BILITOT 0.4 01/09/2020 0812   GFRNONAA 13 (L) 04/09/2020 0800   GFRNONAA >60 09/30/2013 0953   GFRAA 15 (L) 04/09/2020 0800   GFRAA >60 09/30/2013 0953    No results found for: SPEP, UPEP  Lab Results  Component Value Date   WBC 4.2 04/09/2020   NEUTROABS 3.6 04/09/2020   HGB 10.1 (L) 04/09/2020   HCT 29.4 (L) 04/09/2020   MCV 90.2 04/09/2020   PLT 96 (L) 04/09/2020      Chemistry      Component Value Date/Time   NA 136 04/09/2020 0800   NA 138 01/09/2020 0812   K 4.1 04/09/2020 0800   CL 102 04/09/2020 0800   CO2 22 04/09/2020 0800   BUN 58 (H) 04/09/2020 0800   BUN 52 (H) 01/09/2020 0812   CREATININE 4.53 (H) 04/09/2020 0800   CREATININE 1.12 09/30/2013 0953      Component Value Date/Time   CALCIUM 8.4 (L) 04/09/2020 0800   ALKPHOS 48 04/09/2020 0800   AST 15 04/09/2020 0800   ALT 16 04/09/2020 0800   BILITOT 0.7 04/09/2020 0800   BILITOT 0.4 01/09/2020 2229       RADIOGRAPHIC STUDIES: I have personally reviewed the radiological images as listed and agreed with the findings in the report. No results found.   ASSESSMENT & PLAN:  Primary myelofibrosis ([place]) # Primary Myelofiborisis [Bone marrow 2010] jak-2 positive; most  recently on jakafi [Jakafi 10 mg once a day in AM while on  PD. ]since October 2nd 2019.[id number]; overall clinically stable.  # June 2021- [place]- 380 cc; July 3rd 2020- US spleen- 630 [baseline Feb 2019- 980cc];POOR  TOLERANCE; STOPPED [Tapered off on July 8th ].  Recommend close surveillance with ultrasound every 4 months.  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  Hemoglobin 10; STABLE.   #CKD stage IV-V [Dr. Latif]  on PD; STABLE-  #Antimicrobial prophylaxis-acyclovir 200 mg twice a day. STABLE.; STOP when finishes the script.   # COVID vaccine: Patient is to unvaccinated to Covid-19.  Concerned about potential adverse events.  I again educated the patient/and the wife regarding the concerns for extremely high risk of infection/serious complications from the new variants.  Benefits of Covid vaccine outweigh the risk at any time; and strongly recommend COVID-19 vaccination.   # DISPOSITION: # follow up in 4 months-labs- MD-CBC/CMP;LDH; US spleen prior--Dr.B    Cc; Dr.[name] /Dr.Johnson   Orders Placed This Encounter  Procedures  . US SPLEEN (ABDOMEN LIMITED)    Standing Status:   Future    Standing Expiration Date:   04/09/2021    Order Specific Question:   Reason for Exam (SYMPTOM  OR DIAGNOSIS REQUIRED)    Answer:   primary myleofibrosis    Order Specific Question:   Preferred imaging location?    Answer:   [facility]  . CBC with Differential/Platelet    Standing Status:   Future    Number of Occurrences:   1    Standing Expiration Date:   04/09/2021  . Comprehensive metabolic panel    Standing Status:   Future    Number of Occurrences:   1    Standing Expiration Date:   04/09/2021  . Lactate dehydrogenase    Standing Status:   Future    Number of Occurrences:   1    Standing Expiration Date:   04/09/2021  . CBC with Differential/Platelet    Standing Status:   Future    Standing Expiration Date:   04/09/2021  . Comprehensive metabolic panel    Standing Status:   Future    Standing Expiration Date:   04/09/2021  . Lactate dehydrogenase    Standing Status:   Future    Standing Expiration Date:    04/09/2021   All questions were answered. The patient knows to call the clinic with any problems, questions or concerns.     Cammie Sickle, MD 04/09/2020 9:09 AM

## 2020-04-10 DIAGNOSIS — N186 End stage renal disease: Secondary | ICD-10-CM | POA: Diagnosis not present

## 2020-04-10 DIAGNOSIS — Z992 Dependence on renal dialysis: Secondary | ICD-10-CM | POA: Diagnosis not present

## 2020-04-11 DIAGNOSIS — N186 End stage renal disease: Secondary | ICD-10-CM | POA: Diagnosis not present

## 2020-04-11 DIAGNOSIS — Z992 Dependence on renal dialysis: Secondary | ICD-10-CM | POA: Diagnosis not present

## 2020-04-12 DIAGNOSIS — Z992 Dependence on renal dialysis: Secondary | ICD-10-CM | POA: Diagnosis not present

## 2020-04-12 DIAGNOSIS — N186 End stage renal disease: Secondary | ICD-10-CM | POA: Diagnosis not present

## 2020-04-13 DIAGNOSIS — N186 End stage renal disease: Secondary | ICD-10-CM | POA: Diagnosis not present

## 2020-04-13 DIAGNOSIS — Z992 Dependence on renal dialysis: Secondary | ICD-10-CM | POA: Diagnosis not present

## 2020-04-15 DIAGNOSIS — Z992 Dependence on renal dialysis: Secondary | ICD-10-CM | POA: Diagnosis not present

## 2020-04-15 DIAGNOSIS — N186 End stage renal disease: Secondary | ICD-10-CM | POA: Diagnosis not present

## 2020-04-16 DIAGNOSIS — N186 End stage renal disease: Secondary | ICD-10-CM | POA: Diagnosis not present

## 2020-04-16 DIAGNOSIS — Z992 Dependence on renal dialysis: Secondary | ICD-10-CM | POA: Diagnosis not present

## 2020-04-17 DIAGNOSIS — N186 End stage renal disease: Secondary | ICD-10-CM | POA: Diagnosis not present

## 2020-04-17 DIAGNOSIS — Z992 Dependence on renal dialysis: Secondary | ICD-10-CM | POA: Diagnosis not present

## 2020-04-18 DIAGNOSIS — N186 End stage renal disease: Secondary | ICD-10-CM | POA: Diagnosis not present

## 2020-04-18 DIAGNOSIS — Z992 Dependence on renal dialysis: Secondary | ICD-10-CM | POA: Diagnosis not present

## 2020-04-19 DIAGNOSIS — N186 End stage renal disease: Secondary | ICD-10-CM | POA: Diagnosis not present

## 2020-04-19 DIAGNOSIS — Z992 Dependence on renal dialysis: Secondary | ICD-10-CM | POA: Diagnosis not present

## 2020-04-20 DIAGNOSIS — N186 End stage renal disease: Secondary | ICD-10-CM | POA: Diagnosis not present

## 2020-04-20 DIAGNOSIS — Z992 Dependence on renal dialysis: Secondary | ICD-10-CM | POA: Diagnosis not present

## 2020-04-21 DIAGNOSIS — N186 End stage renal disease: Secondary | ICD-10-CM | POA: Diagnosis not present

## 2020-04-21 DIAGNOSIS — Z992 Dependence on renal dialysis: Secondary | ICD-10-CM | POA: Diagnosis not present

## 2020-04-22 DIAGNOSIS — Z992 Dependence on renal dialysis: Secondary | ICD-10-CM | POA: Diagnosis not present

## 2020-04-22 DIAGNOSIS — N186 End stage renal disease: Secondary | ICD-10-CM | POA: Diagnosis not present

## 2020-04-23 DIAGNOSIS — Z992 Dependence on renal dialysis: Secondary | ICD-10-CM | POA: Diagnosis not present

## 2020-04-23 DIAGNOSIS — N186 End stage renal disease: Secondary | ICD-10-CM | POA: Diagnosis not present

## 2020-04-24 DIAGNOSIS — N186 End stage renal disease: Secondary | ICD-10-CM | POA: Diagnosis not present

## 2020-04-24 DIAGNOSIS — Z992 Dependence on renal dialysis: Secondary | ICD-10-CM | POA: Diagnosis not present

## 2020-04-25 DIAGNOSIS — Z992 Dependence on renal dialysis: Secondary | ICD-10-CM | POA: Diagnosis not present

## 2020-04-25 DIAGNOSIS — N186 End stage renal disease: Secondary | ICD-10-CM | POA: Diagnosis not present

## 2020-04-26 DIAGNOSIS — N186 End stage renal disease: Secondary | ICD-10-CM | POA: Diagnosis not present

## 2020-04-26 DIAGNOSIS — Z992 Dependence on renal dialysis: Secondary | ICD-10-CM | POA: Diagnosis not present

## 2020-04-27 DIAGNOSIS — Z992 Dependence on renal dialysis: Secondary | ICD-10-CM | POA: Diagnosis not present

## 2020-04-27 DIAGNOSIS — N186 End stage renal disease: Secondary | ICD-10-CM | POA: Diagnosis not present

## 2020-04-29 DIAGNOSIS — Z992 Dependence on renal dialysis: Secondary | ICD-10-CM | POA: Diagnosis not present

## 2020-04-29 DIAGNOSIS — N186 End stage renal disease: Secondary | ICD-10-CM | POA: Diagnosis not present

## 2020-04-30 DIAGNOSIS — Z992 Dependence on renal dialysis: Secondary | ICD-10-CM | POA: Diagnosis not present

## 2020-04-30 DIAGNOSIS — N186 End stage renal disease: Secondary | ICD-10-CM | POA: Diagnosis not present

## 2020-05-01 DIAGNOSIS — Z992 Dependence on renal dialysis: Secondary | ICD-10-CM | POA: Diagnosis not present

## 2020-05-01 DIAGNOSIS — N186 End stage renal disease: Secondary | ICD-10-CM | POA: Diagnosis not present

## 2020-05-02 DIAGNOSIS — N186 End stage renal disease: Secondary | ICD-10-CM | POA: Diagnosis not present

## 2020-05-02 DIAGNOSIS — Z992 Dependence on renal dialysis: Secondary | ICD-10-CM | POA: Diagnosis not present

## 2020-05-03 ENCOUNTER — Other Ambulatory Visit: Payer: Self-pay

## 2020-05-03 ENCOUNTER — Encounter: Payer: Self-pay | Admitting: Family Medicine

## 2020-05-03 ENCOUNTER — Ambulatory Visit (INDEPENDENT_AMBULATORY_CARE_PROVIDER_SITE_OTHER): Payer: Medicare HMO | Admitting: Family Medicine

## 2020-05-03 VITALS — BP 134/72 | HR 81 | Temp 98.8°F | Wt 174.0 lb

## 2020-05-03 DIAGNOSIS — Z992 Dependence on renal dialysis: Secondary | ICD-10-CM | POA: Diagnosis not present

## 2020-05-03 DIAGNOSIS — N186 End stage renal disease: Secondary | ICD-10-CM | POA: Diagnosis not present

## 2020-05-03 DIAGNOSIS — M109 Gout, unspecified: Secondary | ICD-10-CM | POA: Diagnosis not present

## 2020-05-03 MED ORDER — COLCHICINE 0.6 MG PO TABS: 0.6000 mg | ORAL_TABLET | Freq: Every day | ORAL | 0 refills | Status: DC

## 2020-05-03 MED ORDER — PREDNISONE 50 MG PO TABS: 50.0000 mg | ORAL_TABLET | Freq: Every day | ORAL | 0 refills | Status: DC

## 2020-05-03 NOTE — Progress Notes (Signed)
BP 134/72   Pulse 81   Temp 98.8 F (37.1 C) (Oral)   Wt 174 lb (78.9 kg)   SpO2 93%   BMI 27.25 kg/m    Subjective:    [name], male    DOB: [birth year], 64 y.o.   MRN: [id number]  HPI: James Arnold is a 64 y.o. male  Chief Complaint  Patient presents with  . Foot Pain    right foot pain and swollen since yesterday   FOOT PAIN Duration: 1 day Involved foot: right Mechanism of injury: unknown Location: R great toe Onset: sudden  Severity: severe  Quality:  Aching and sore Frequency: constant Radiation: no Aggravating factors: weight bearing and walking  Alleviating factors:nothing   Status: better Treatments attempted: none   Relief with NSAIDs?:  No NSAIDs Taken Weakness with weight bearing or walking: no Morning stiffness: no Swelling: yes Redness: yes Bruising: no Paresthesias / decreased sensation: no  Fevers:no  Relevant past medical, surgical, family and social history reviewed and updated as indicated. Interim medical history since our last visit reviewed. Allergies and medications reviewed and updated.  Review of Systems  Constitutional: Negative.   Respiratory: Negative.   Cardiovascular: Negative.   Musculoskeletal: Positive for arthralgias. Negative for back pain, gait problem, joint swelling, myalgias, neck pain and neck stiffness.  Skin: Negative.   Neurological: Negative.   Psychiatric/Behavioral: Negative.     Per HPI unless specifically indicated above     Objective:    BP 134/72   Pulse 81   Temp 98.8 F (37.1 C) (Oral)   Wt 174 lb (78.9 kg)   SpO2 93%   BMI 27.25 kg/m   Wt Readings from Last 3 Encounters:  05/03/20 174 lb (78.9 kg)  04/09/20 174 lb 9.6 oz (79.2 kg)  02/06/20 182 lb 6.4 oz (82.7 kg)    Physical Exam Vitals and nursing note reviewed.  Constitutional:      General: He is not in acute distress.    Appearance: Normal appearance. He is not ill-appearing, toxic-appearing or diaphoretic.    HENT:     Head: Normocephalic and atraumatic.     Right Ear: External ear normal.     Left Ear: External ear normal.     Nose: Nose normal.     Mouth/Throat:     Mouth: Mucous membranes are moist.     Pharynx: Oropharynx is clear.  Eyes:     General: No scleral icterus.       Right eye: No discharge.        Left eye: No discharge.     Extraocular Movements: Extraocular movements intact.     Conjunctiva/sclera: Conjunctivae normal.     Pupils: Pupils are equal, round, and reactive to light.  Cardiovascular:     Rate and Rhythm: Normal rate and regular rhythm.     Pulses: Normal pulses.     Heart sounds: Normal heart sounds. No murmur heard.  No friction rub. No gallop.   Pulmonary:     Effort: Pulmonary effort is normal. No respiratory distress.     Breath sounds: Normal breath sounds. No stridor. No wheezing, rhonchi or rales.  Chest:     Chest wall: No tenderness.  Musculoskeletal:        General: Normal range of motion.     Cervical back: Normal range of motion and neck supple.  Skin:    General: Skin is warm and dry.     Capillary Refill: Capillary  refill takes less than 2 seconds.     Coloration: Skin is not jaundiced or pale.     Findings: Erythema (erythematous, swollen, hot, R great toe) present. No bruising, lesion or rash.  Neurological:     General: No focal deficit present.     Mental Status: He is alert and oriented to person, place, and time. Mental status is at baseline.  Psychiatric:        Mood and Affect: Mood normal.        Behavior: Behavior normal.        Thought Content: Thought content normal.        Judgment: Judgment normal.     Results for orders placed or performed in visit on 05/03/20  Uric acid  Result Value Ref Range   Uric Acid 10.0 (H) 3.8 - 8.4 mg/dL  Comprehensive metabolic panel  Result Value Ref Range   Glucose 85 65 - 99 mg/dL   BUN 48 (H) 8 - 27 mg/dL   Creatinine, Ser 4.40 (H) 0.76 - 1.27 mg/dL   GFR calc non Af Amer 13 (L)  >59 mL/min/1.73   GFR calc Af Amer 15 (L) >59 mL/min/1.73   BUN/Creatinine Ratio 11 10 - 24   Sodium 137 134 - 144 mmol/L   Potassium 4.8 3.5 - 5.2 mmol/L   Chloride 101 96 - 106 mmol/L   CO2 19 (L) 20 - 29 mmol/L   Calcium 8.7 8.6 - 10.2 mg/dL   Total Protein 6.0 6.0 - 8.5 g/dL   Albumin 4.0 3.8 - 4.8 g/dL   Globulin, Total 2.0 1.5 - 4.5 g/dL   Albumin/Globulin Ratio 2.0 1.2 - 2.2   Bilirubin Total 0.4 0.0 - 1.2 mg/dL   Alkaline Phosphatase 58 48 - 121 IU/L   AST 11 0 - 40 IU/L   ALT 9 0 - 44 IU/L      Assessment & Plan:   Problem List Items Addressed This Visit      Other   Gout - Primary    Will treat with prednisone and cholchicine. Does dialysis nightly at home. Call if not getting better or getting worse. WIll check uric acid.      Relevant Orders   Uric acid (Completed)   Comprehensive metabolic panel (Completed)       Follow up plan: Return if symptoms worsen or fail to improve.

## 2020-05-04 DIAGNOSIS — N186 End stage renal disease: Secondary | ICD-10-CM | POA: Diagnosis not present

## 2020-05-04 DIAGNOSIS — Z992 Dependence on renal dialysis: Secondary | ICD-10-CM | POA: Diagnosis not present

## 2020-05-04 LAB — COMPREHENSIVE METABOLIC PANEL
ALT: 9 IU/L (ref 0–44)
AST: 11 IU/L (ref 0–40)
Albumin/Globulin Ratio: 2 (ref 1.2–2.2)
Albumin: 4 g/dL (ref 3.8–4.8)
Alkaline Phosphatase: 58 IU/L (ref 48–121)
BUN/Creatinine Ratio: 11 (ref 10–24)
BUN: 48 mg/dL — ABNORMAL HIGH (ref 8–27)
Bilirubin Total: 0.4 mg/dL (ref 0.0–1.2)
CO2: 19 mmol/L — ABNORMAL LOW (ref 20–29)
Calcium: 8.7 mg/dL (ref 8.6–10.2)
Chloride: 101 mmol/L (ref 96–106)
Creatinine, Ser: 4.4 mg/dL — ABNORMAL HIGH (ref 0.76–1.27)
GFR calc Af Amer: 15 mL/min/{1.73_m2} — ABNORMAL LOW (ref >59)
GFR calc non Af Amer: 13 mL/min/{1.73_m2} — ABNORMAL LOW (ref >59)
Globulin, Total: 2 g/dL (ref 1.5–4.5)
Glucose: 85 mg/dL (ref 65–99)
Potassium: 4.8 mmol/L (ref 3.5–5.2)
Sodium: 137 mmol/L (ref 134–144)
Total Protein: 6 g/dL (ref 6.0–8.5)

## 2020-05-04 LAB — URIC ACID: Uric Acid: 10 mg/dL — ABNORMAL HIGH (ref 3.8–8.4)

## 2020-05-06 DIAGNOSIS — Z992 Dependence on renal dialysis: Secondary | ICD-10-CM | POA: Diagnosis not present

## 2020-05-06 DIAGNOSIS — N186 End stage renal disease: Secondary | ICD-10-CM | POA: Diagnosis not present

## 2020-05-07 ENCOUNTER — Encounter: Payer: Self-pay | Admitting: Family Medicine

## 2020-05-07 DIAGNOSIS — N186 End stage renal disease: Secondary | ICD-10-CM | POA: Diagnosis not present

## 2020-05-07 DIAGNOSIS — Z992 Dependence on renal dialysis: Secondary | ICD-10-CM | POA: Diagnosis not present

## 2020-05-07 NOTE — Assessment & Plan Note (Signed)
Will treat with prednisone and cholchicine. Does dialysis nightly at home. Call if not getting better or getting worse. WIll check uric acid.

## 2020-05-08 DIAGNOSIS — Z992 Dependence on renal dialysis: Secondary | ICD-10-CM | POA: Diagnosis not present

## 2020-05-08 DIAGNOSIS — N186 End stage renal disease: Secondary | ICD-10-CM | POA: Diagnosis not present

## 2020-05-09 ENCOUNTER — Telehealth: Payer: Self-pay

## 2020-05-09 DIAGNOSIS — Z992 Dependence on renal dialysis: Secondary | ICD-10-CM | POA: Diagnosis not present

## 2020-05-09 DIAGNOSIS — N186 End stage renal disease: Secondary | ICD-10-CM | POA: Diagnosis not present

## 2020-05-09 NOTE — Telephone Encounter (Signed)
Pharmacy sent a fax stating that Colchicine was not covered and preferred alternative is Mitigare. Can we send a prescription to the pharmacy for this please?

## 2020-05-10 DIAGNOSIS — Z992 Dependence on renal dialysis: Secondary | ICD-10-CM | POA: Diagnosis not present

## 2020-05-10 DIAGNOSIS — N186 End stage renal disease: Secondary | ICD-10-CM | POA: Diagnosis not present

## 2020-05-11 ENCOUNTER — Other Ambulatory Visit: Payer: Self-pay | Admitting: Family Medicine

## 2020-05-11 DIAGNOSIS — N186 End stage renal disease: Secondary | ICD-10-CM | POA: Diagnosis not present

## 2020-05-11 DIAGNOSIS — Z992 Dependence on renal dialysis: Secondary | ICD-10-CM | POA: Diagnosis not present

## 2020-05-11 NOTE — Telephone Encounter (Signed)
Requested Prescriptions  Pending Prescriptions Disp Refills  . amLODipine (NORVASC) 10 MG tablet [Pharmacy Med Name: AMLODIPINE BESYLATE 10 MG Tablet] 90 tablet 0    Sig: TAKE 1 TABLET EVERY DAY     Cardiovascular:  Calcium Channel Blockers Passed - 05/11/2020  7:34 PM      Passed - Last BP in normal range    BP Readings from Last 1 Encounters:  05/03/20 134/72         Passed - Valid encounter within last 6 months    Recent Outpatient Visits          1 week ago Acute gout of left foot, unspecified cause   [facility] [place], Megan P, DO   4 months ago Routine general medical examination at a health care facility   [facility], [place] P, DO   8 months ago Benign hypertensive renal disease   [place], [place], DO   1 year ago Benign hypertensive renal disease   [place], Megan P, DO   1 year ago Medicare annual wellness visit, subsequent   [employer], [place], DO      Future Appointments            In 2 months Johnson, Megan P, DO [facility], [place]   In 8 months  [facility], [place]

## 2020-05-11 NOTE — Telephone Encounter (Signed)
I signed a paper for this- do they need a e-script?

## 2020-05-11 NOTE — Telephone Encounter (Signed)
Yes, they need a script with quantity, directions and all that.

## 2020-05-13 DIAGNOSIS — N186 End stage renal disease: Secondary | ICD-10-CM | POA: Diagnosis not present

## 2020-05-13 DIAGNOSIS — Z992 Dependence on renal dialysis: Secondary | ICD-10-CM | POA: Diagnosis not present

## 2020-05-14 DIAGNOSIS — N186 End stage renal disease: Secondary | ICD-10-CM | POA: Diagnosis not present

## 2020-05-14 DIAGNOSIS — Z992 Dependence on renal dialysis: Secondary | ICD-10-CM | POA: Diagnosis not present

## 2020-05-14 MED ORDER — COLCHICINE 0.6 MG PO CAPS: 1.0000 | ORAL_CAPSULE | Freq: Every day | ORAL | 0 refills | Status: DC

## 2020-05-15 DIAGNOSIS — Z992 Dependence on renal dialysis: Secondary | ICD-10-CM | POA: Diagnosis not present

## 2020-05-15 DIAGNOSIS — N186 End stage renal disease: Secondary | ICD-10-CM | POA: Diagnosis not present

## 2020-05-16 DIAGNOSIS — N186 End stage renal disease: Secondary | ICD-10-CM | POA: Diagnosis not present

## 2020-05-16 DIAGNOSIS — Z992 Dependence on renal dialysis: Secondary | ICD-10-CM | POA: Diagnosis not present

## 2020-05-17 DIAGNOSIS — N186 End stage renal disease: Secondary | ICD-10-CM | POA: Diagnosis not present

## 2020-05-17 DIAGNOSIS — Z992 Dependence on renal dialysis: Secondary | ICD-10-CM | POA: Diagnosis not present

## 2020-05-18 ENCOUNTER — Other Ambulatory Visit: Payer: Self-pay | Admitting: Family Medicine

## 2020-05-18 DIAGNOSIS — Z992 Dependence on renal dialysis: Secondary | ICD-10-CM | POA: Diagnosis not present

## 2020-05-18 DIAGNOSIS — N186 End stage renal disease: Secondary | ICD-10-CM | POA: Diagnosis not present

## 2020-05-18 NOTE — Telephone Encounter (Signed)
Requested medication (s) are due for refill today: yes  Requested medication (s) are on the active medication list:yes  Last refill:  01/09/20  #180  1 refill  Future visit scheduled: yes  Notes to clinic: Medication has end date of 04/08/20    Requested Prescriptions  Pending Prescriptions Disp Refills   carvedilol (COREG) 12.5 MG tablet [Pharmacy Med Name: CARVEDILOL 12.5 MG Tablet] 180 tablet 1    Sig: TAKE 1 TABLET TWICE DAILY      Cardiovascular:  Beta Blockers Passed - 05/18/2020  7:43 PM      Passed - Last BP in normal range    BP Readings from Last 1 Encounters:  05/03/20 134/72          Passed - Last Heart Rate in normal range    Pulse Readings from Last 1 Encounters:  05/03/20 81          Passed - Valid encounter within last 6 months    Recent Outpatient Visits           2 weeks ago Acute gout of left foot, unspecified cause   [facility] [place], Megan P, DO   4 months ago Routine general medical examination at a health care facility   [facility], [place] P, DO   8 months ago Benign hypertensive renal disease   [place], [place], DO   1 year ago Benign hypertensive renal disease   [place], Megan P, DO   1 year ago Medicare annual wellness visit, subsequent   [employer], [place], DO       Future Appointments             In 1 month Johnson, Megan P, DO [facility] [facility], [place]   In 8 months  [employer] [facility], [place]

## 2020-05-20 DIAGNOSIS — N186 End stage renal disease: Secondary | ICD-10-CM | POA: Diagnosis not present

## 2020-05-20 DIAGNOSIS — Z992 Dependence on renal dialysis: Secondary | ICD-10-CM | POA: Diagnosis not present

## 2020-05-21 DIAGNOSIS — N186 End stage renal disease: Secondary | ICD-10-CM | POA: Diagnosis not present

## 2020-05-21 DIAGNOSIS — Z992 Dependence on renal dialysis: Secondary | ICD-10-CM | POA: Diagnosis not present

## 2020-05-21 NOTE — Telephone Encounter (Signed)
Pt's wife stated Pt has enough and they were ok until October.

## 2020-05-22 DIAGNOSIS — Z992 Dependence on renal dialysis: Secondary | ICD-10-CM | POA: Diagnosis not present

## 2020-05-22 DIAGNOSIS — N186 End stage renal disease: Secondary | ICD-10-CM | POA: Diagnosis not present

## 2020-05-23 ENCOUNTER — Other Ambulatory Visit: Payer: Self-pay | Admitting: Family Medicine

## 2020-05-23 DIAGNOSIS — I129 Hypertensive chronic kidney disease with stage 1 through stage 4 chronic kidney disease, or unspecified chronic kidney disease: Secondary | ICD-10-CM

## 2020-05-23 DIAGNOSIS — N186 End stage renal disease: Secondary | ICD-10-CM | POA: Diagnosis not present

## 2020-05-23 DIAGNOSIS — Z992 Dependence on renal dialysis: Secondary | ICD-10-CM | POA: Diagnosis not present

## 2020-05-24 DIAGNOSIS — N186 End stage renal disease: Secondary | ICD-10-CM | POA: Diagnosis not present

## 2020-05-24 DIAGNOSIS — Z992 Dependence on renal dialysis: Secondary | ICD-10-CM | POA: Diagnosis not present

## 2020-05-25 DIAGNOSIS — N186 End stage renal disease: Secondary | ICD-10-CM | POA: Diagnosis not present

## 2020-05-25 DIAGNOSIS — Z992 Dependence on renal dialysis: Secondary | ICD-10-CM | POA: Diagnosis not present

## 2020-05-27 DIAGNOSIS — Z992 Dependence on renal dialysis: Secondary | ICD-10-CM | POA: Diagnosis not present

## 2020-05-27 DIAGNOSIS — N186 End stage renal disease: Secondary | ICD-10-CM | POA: Diagnosis not present

## 2020-05-28 DIAGNOSIS — N186 End stage renal disease: Secondary | ICD-10-CM | POA: Diagnosis not present

## 2020-05-28 DIAGNOSIS — Z992 Dependence on renal dialysis: Secondary | ICD-10-CM | POA: Diagnosis not present

## 2020-05-29 DIAGNOSIS — Z992 Dependence on renal dialysis: Secondary | ICD-10-CM | POA: Diagnosis not present

## 2020-05-29 DIAGNOSIS — N186 End stage renal disease: Secondary | ICD-10-CM | POA: Diagnosis not present

## 2020-05-30 DIAGNOSIS — N186 End stage renal disease: Secondary | ICD-10-CM | POA: Diagnosis not present

## 2020-05-30 DIAGNOSIS — Z992 Dependence on renal dialysis: Secondary | ICD-10-CM | POA: Diagnosis not present

## 2020-05-31 DIAGNOSIS — Z992 Dependence on renal dialysis: Secondary | ICD-10-CM | POA: Diagnosis not present

## 2020-05-31 DIAGNOSIS — N186 End stage renal disease: Secondary | ICD-10-CM | POA: Diagnosis not present

## 2020-06-01 DIAGNOSIS — Z992 Dependence on renal dialysis: Secondary | ICD-10-CM | POA: Diagnosis not present

## 2020-06-01 DIAGNOSIS — N186 End stage renal disease: Secondary | ICD-10-CM | POA: Diagnosis not present

## 2020-06-03 DIAGNOSIS — N186 End stage renal disease: Secondary | ICD-10-CM | POA: Diagnosis not present

## 2020-06-03 DIAGNOSIS — Z992 Dependence on renal dialysis: Secondary | ICD-10-CM | POA: Diagnosis not present

## 2020-06-04 DIAGNOSIS — N186 End stage renal disease: Secondary | ICD-10-CM | POA: Diagnosis not present

## 2020-06-04 DIAGNOSIS — Z992 Dependence on renal dialysis: Secondary | ICD-10-CM | POA: Diagnosis not present

## 2020-06-05 DIAGNOSIS — N186 End stage renal disease: Secondary | ICD-10-CM | POA: Diagnosis not present

## 2020-06-05 DIAGNOSIS — Z992 Dependence on renal dialysis: Secondary | ICD-10-CM | POA: Diagnosis not present

## 2020-06-06 DIAGNOSIS — Z992 Dependence on renal dialysis: Secondary | ICD-10-CM | POA: Diagnosis not present

## 2020-06-06 DIAGNOSIS — N186 End stage renal disease: Secondary | ICD-10-CM | POA: Diagnosis not present

## 2020-06-07 DIAGNOSIS — Z992 Dependence on renal dialysis: Secondary | ICD-10-CM | POA: Diagnosis not present

## 2020-06-07 DIAGNOSIS — N186 End stage renal disease: Secondary | ICD-10-CM | POA: Diagnosis not present

## 2020-06-08 DIAGNOSIS — Z992 Dependence on renal dialysis: Secondary | ICD-10-CM | POA: Diagnosis not present

## 2020-06-08 DIAGNOSIS — N186 End stage renal disease: Secondary | ICD-10-CM | POA: Diagnosis not present

## 2020-06-10 DIAGNOSIS — N186 End stage renal disease: Secondary | ICD-10-CM | POA: Diagnosis not present

## 2020-06-10 DIAGNOSIS — Z992 Dependence on renal dialysis: Secondary | ICD-10-CM | POA: Diagnosis not present

## 2020-06-11 DIAGNOSIS — N186 End stage renal disease: Secondary | ICD-10-CM | POA: Diagnosis not present

## 2020-06-11 DIAGNOSIS — Z992 Dependence on renal dialysis: Secondary | ICD-10-CM | POA: Diagnosis not present

## 2020-06-12 DIAGNOSIS — Z992 Dependence on renal dialysis: Secondary | ICD-10-CM | POA: Diagnosis not present

## 2020-06-12 DIAGNOSIS — N186 End stage renal disease: Secondary | ICD-10-CM | POA: Diagnosis not present

## 2020-06-13 DIAGNOSIS — Z992 Dependence on renal dialysis: Secondary | ICD-10-CM | POA: Diagnosis not present

## 2020-06-13 DIAGNOSIS — N186 End stage renal disease: Secondary | ICD-10-CM | POA: Diagnosis not present

## 2020-06-14 DIAGNOSIS — N186 End stage renal disease: Secondary | ICD-10-CM | POA: Diagnosis not present

## 2020-06-14 DIAGNOSIS — Z992 Dependence on renal dialysis: Secondary | ICD-10-CM | POA: Diagnosis not present

## 2020-06-15 DIAGNOSIS — N186 End stage renal disease: Secondary | ICD-10-CM | POA: Diagnosis not present

## 2020-06-15 DIAGNOSIS — Z992 Dependence on renal dialysis: Secondary | ICD-10-CM | POA: Diagnosis not present

## 2020-06-17 DIAGNOSIS — N186 End stage renal disease: Secondary | ICD-10-CM | POA: Diagnosis not present

## 2020-06-17 DIAGNOSIS — Z992 Dependence on renal dialysis: Secondary | ICD-10-CM | POA: Diagnosis not present

## 2020-06-18 DIAGNOSIS — Z992 Dependence on renal dialysis: Secondary | ICD-10-CM | POA: Diagnosis not present

## 2020-06-18 DIAGNOSIS — N186 End stage renal disease: Secondary | ICD-10-CM | POA: Diagnosis not present

## 2020-06-19 DIAGNOSIS — Z992 Dependence on renal dialysis: Secondary | ICD-10-CM | POA: Diagnosis not present

## 2020-06-19 DIAGNOSIS — N186 End stage renal disease: Secondary | ICD-10-CM | POA: Diagnosis not present

## 2020-06-20 ENCOUNTER — Other Ambulatory Visit: Payer: Self-pay | Admitting: Family Medicine

## 2020-06-20 DIAGNOSIS — Z992 Dependence on renal dialysis: Secondary | ICD-10-CM | POA: Diagnosis not present

## 2020-06-20 DIAGNOSIS — N186 End stage renal disease: Secondary | ICD-10-CM | POA: Diagnosis not present

## 2020-06-21 DIAGNOSIS — N186 End stage renal disease: Secondary | ICD-10-CM | POA: Diagnosis not present

## 2020-06-21 DIAGNOSIS — Z992 Dependence on renal dialysis: Secondary | ICD-10-CM | POA: Diagnosis not present

## 2020-06-22 DIAGNOSIS — Z992 Dependence on renal dialysis: Secondary | ICD-10-CM | POA: Diagnosis not present

## 2020-06-22 DIAGNOSIS — N186 End stage renal disease: Secondary | ICD-10-CM | POA: Diagnosis not present

## 2020-06-24 DIAGNOSIS — N186 End stage renal disease: Secondary | ICD-10-CM | POA: Diagnosis not present

## 2020-06-24 DIAGNOSIS — Z992 Dependence on renal dialysis: Secondary | ICD-10-CM | POA: Diagnosis not present

## 2020-06-25 DIAGNOSIS — Z992 Dependence on renal dialysis: Secondary | ICD-10-CM | POA: Diagnosis not present

## 2020-06-25 DIAGNOSIS — N186 End stage renal disease: Secondary | ICD-10-CM | POA: Diagnosis not present

## 2020-06-26 DIAGNOSIS — Z992 Dependence on renal dialysis: Secondary | ICD-10-CM | POA: Diagnosis not present

## 2020-06-26 DIAGNOSIS — N186 End stage renal disease: Secondary | ICD-10-CM | POA: Diagnosis not present

## 2020-06-27 DIAGNOSIS — Z992 Dependence on renal dialysis: Secondary | ICD-10-CM | POA: Diagnosis not present

## 2020-06-27 DIAGNOSIS — N186 End stage renal disease: Secondary | ICD-10-CM | POA: Diagnosis not present

## 2020-06-28 DIAGNOSIS — N186 End stage renal disease: Secondary | ICD-10-CM | POA: Diagnosis not present

## 2020-06-28 DIAGNOSIS — Z992 Dependence on renal dialysis: Secondary | ICD-10-CM | POA: Diagnosis not present

## 2020-06-29 ENCOUNTER — Other Ambulatory Visit: Payer: Self-pay | Admitting: Family Medicine

## 2020-06-29 DIAGNOSIS — Z992 Dependence on renal dialysis: Secondary | ICD-10-CM | POA: Diagnosis not present

## 2020-06-29 DIAGNOSIS — N186 End stage renal disease: Secondary | ICD-10-CM | POA: Diagnosis not present

## 2020-06-29 MED ORDER — LOSARTAN POTASSIUM 50 MG PO TABS: 50.0000 mg | ORAL_TABLET | Freq: Every day | ORAL | 0 refills | Status: DC

## 2020-06-29 NOTE — Telephone Encounter (Signed)
Pt's spouse called in requesting a refill for losartan (COZAAR) 50 MG tablet    Pharmacy:  [facility] [place] Phone:  [phone]  Fax:  [phone]

## 2020-07-01 DIAGNOSIS — Z992 Dependence on renal dialysis: Secondary | ICD-10-CM | POA: Diagnosis not present

## 2020-07-01 DIAGNOSIS — N186 End stage renal disease: Secondary | ICD-10-CM | POA: Diagnosis not present

## 2020-07-02 DIAGNOSIS — Z992 Dependence on renal dialysis: Secondary | ICD-10-CM | POA: Diagnosis not present

## 2020-07-02 DIAGNOSIS — N186 End stage renal disease: Secondary | ICD-10-CM | POA: Diagnosis not present

## 2020-07-03 DIAGNOSIS — Z992 Dependence on renal dialysis: Secondary | ICD-10-CM | POA: Diagnosis not present

## 2020-07-03 DIAGNOSIS — N186 End stage renal disease: Secondary | ICD-10-CM | POA: Diagnosis not present

## 2020-07-04 DIAGNOSIS — Z992 Dependence on renal dialysis: Secondary | ICD-10-CM | POA: Diagnosis not present

## 2020-07-04 DIAGNOSIS — N186 End stage renal disease: Secondary | ICD-10-CM | POA: Diagnosis not present

## 2020-07-05 DIAGNOSIS — N186 End stage renal disease: Secondary | ICD-10-CM | POA: Diagnosis not present

## 2020-07-05 DIAGNOSIS — Z992 Dependence on renal dialysis: Secondary | ICD-10-CM | POA: Diagnosis not present

## 2020-07-06 DIAGNOSIS — N186 End stage renal disease: Secondary | ICD-10-CM | POA: Diagnosis not present

## 2020-07-06 DIAGNOSIS — Z992 Dependence on renal dialysis: Secondary | ICD-10-CM | POA: Diagnosis not present

## 2020-07-08 DIAGNOSIS — N186 End stage renal disease: Secondary | ICD-10-CM | POA: Diagnosis not present

## 2020-07-08 DIAGNOSIS — Z992 Dependence on renal dialysis: Secondary | ICD-10-CM | POA: Diagnosis not present

## 2020-07-09 DIAGNOSIS — Z992 Dependence on renal dialysis: Secondary | ICD-10-CM | POA: Diagnosis not present

## 2020-07-09 DIAGNOSIS — N186 End stage renal disease: Secondary | ICD-10-CM | POA: Diagnosis not present

## 2020-07-10 ENCOUNTER — Other Ambulatory Visit: Payer: Self-pay

## 2020-07-10 ENCOUNTER — Ambulatory Visit (INDEPENDENT_AMBULATORY_CARE_PROVIDER_SITE_OTHER): Payer: Medicare HMO | Admitting: Family Medicine

## 2020-07-10 ENCOUNTER — Encounter: Payer: Self-pay | Admitting: Family Medicine

## 2020-07-10 VITALS — BP 138/75 | HR 68 | Temp 98.0°F | Ht 67.0 in | Wt 173.2 lb

## 2020-07-10 DIAGNOSIS — F339 Major depressive disorder, recurrent, unspecified: Secondary | ICD-10-CM | POA: Diagnosis not present

## 2020-07-10 DIAGNOSIS — N185 Chronic kidney disease, stage 5: Secondary | ICD-10-CM

## 2020-07-10 DIAGNOSIS — F3342 Major depressive disorder, recurrent, in full remission: Secondary | ICD-10-CM | POA: Diagnosis not present

## 2020-07-10 DIAGNOSIS — I132 Hypertensive heart and chronic kidney disease with heart failure and with stage 5 chronic kidney disease, or end stage renal disease: Secondary | ICD-10-CM

## 2020-07-10 DIAGNOSIS — Z992 Dependence on renal dialysis: Secondary | ICD-10-CM | POA: Diagnosis not present

## 2020-07-10 DIAGNOSIS — E782 Mixed hyperlipidemia: Secondary | ICD-10-CM

## 2020-07-10 DIAGNOSIS — N186 End stage renal disease: Secondary | ICD-10-CM | POA: Diagnosis not present

## 2020-07-10 DIAGNOSIS — I129 Hypertensive chronic kidney disease with stage 1 through stage 4 chronic kidney disease, or unspecified chronic kidney disease: Secondary | ICD-10-CM

## 2020-07-10 DIAGNOSIS — M109 Gout, unspecified: Secondary | ICD-10-CM | POA: Diagnosis not present

## 2020-07-10 LAB — URINALYSIS, ROUTINE W REFLEX MICROSCOPIC
Bilirubin, UA: NEGATIVE
Glucose, UA: NEGATIVE
Ketones, UA: NEGATIVE
Leukocytes,UA: NEGATIVE
Nitrite, UA: NEGATIVE
Specific Gravity, UA: 1.015 (ref 1.005–1.030)
Urobilinogen, Ur: 0.2 mg/dL (ref 0.2–1.0)
pH, UA: 7 (ref 5.0–7.5)

## 2020-07-10 LAB — MICROALBUMIN, URINE WAIVED
Creatinine, Urine Waived: 50 mg/dL (ref 10–300)
Microalb, Ur Waived: 150 mg/L — ABNORMAL HIGH (ref 0–19)
Microalb/Creat Ratio: >300 mg/g — ABNORMAL HIGH (ref <30)

## 2020-07-10 LAB — MICROSCOPIC EXAMINATION
Bacteria, UA: NONE SEEN
WBC, UA: NONE SEEN /hpf (ref 0–5)

## 2020-07-10 MED ORDER — CARVEDILOL 12.5 MG PO TABS: 12.5000 mg | ORAL_TABLET | Freq: Two times a day (BID) | ORAL | 1 refills | Status: DC

## 2020-07-10 MED ORDER — LOSARTAN POTASSIUM 50 MG PO TABS
50.0000 mg | ORAL_TABLET | Freq: Every day | ORAL | 1 refills | Status: DC
Start: 2020-07-10 — End: 2020-12-12

## 2020-07-10 MED ORDER — AMLODIPINE BESYLATE 10 MG PO TABS
10.0000 mg | ORAL_TABLET | Freq: Every day | ORAL | 1 refills | Status: DC
Start: 2020-07-10 — End: 2020-12-12

## 2020-07-10 MED ORDER — FUROSEMIDE 20 MG PO TABS: 20.0000 mg | ORAL_TABLET | Freq: Every day | ORAL | 1 refills | Status: DC

## 2020-07-10 NOTE — Assessment & Plan Note (Signed)
Under good control on current regimen. Continue current regimen. Continue to monitor. Call with any concerns. Refills given. Labs drawn today.   

## 2020-07-10 NOTE — Assessment & Plan Note (Signed)
Continue to follow with nephrology. Stable on dialysis. Under good control on current regimen. Continue current regimen. Continue to monitor. Call with any concerns. Refills given. Labs drawn today.

## 2020-07-10 NOTE — Assessment & Plan Note (Signed)
Stable. Continue to follow with dialysis. Call with any concerns. Continue to monitor.

## 2020-07-10 NOTE — Progress Notes (Signed)
BP 138/75   Pulse 68   Temp 98 F (36.7 C) (Oral)   Ht 5\' 7"  (1.702 m)   Wt 173 lb 3.2 oz (78.6 kg)   SpO2 99%   BMI 27.13 kg/m    Subjective:    [name], male    DOB: Jun 24, 1956, 64 y.o.   MRN: [id number]  HPI: James Arnold is a 64 y.o. male  Chief Complaint  Patient presents with  . Depression    Follow up  . Hyperlipidemia    Follow up  . Chronic Kidney Disease    Follow up   HYPERTENSION / HYPERLIPIDEMIA Satisfied with current treatment? no Duration of hypertension: chronic BP monitoring frequency: not checking BP medication side effects: yes Past BP meds: losartan, amlodipine, lasix, carvedilol Duration of hyperlipidemia: chronic Cholesterol medication side effects: no Cholesterol supplements: none Past cholesterol medications: none Medication compliance: excellent compliance Aspirin: no Recent stressors: no Recurrent headaches: no Visual changes: no Palpitations: no Dyspnea: no Chest pain: no Lower extremity edema: no Dizzy/lightheaded: no  DEPRESSION Mood status: controlled Satisfied with current treatment?: yes Symptom severity: mild  Duration of current treatment : chronic Side effects: no Medication compliance: excellent compliance Psychotherapy/counseling: no  Previous psychiatric medications: celexa Depressed mood: no Anxious mood: no Anhedonia: no Significant weight loss or gain: no Insomnia: no  Fatigue: yes Feelings of worthlessness or guilt: no Impaired concentration/indecisiveness: no Suicidal ideations: no Hopelessness: no Crying spells: no Depression screen [facility] 2/9 07/10/2020 05/03/2020 01/09/2020 01/09/2020 09/13/2019  Decreased Interest 0 1 0 0 0  Down, Depressed, Hopeless 0 0 0 0 0  PHQ - 2 Score 0 1 0 0 0  Altered sleeping 0 0 0 - 1  Tired, decreased energy 0 1 0 - 1  Change in appetite 0 0 0 - 0  Feeling bad or failure about yourself  0 0 0 - 0  Trouble concentrating 0 0 0 - 0  Moving slowly or  fidgety/restless 0 0 0 - 0  Suicidal thoughts 0 0 0 - 0  PHQ-9 Score 0 2 0 - 2  Difficult doing work/chores Not difficult at all - Not difficult at all - Not difficult at all  Some recent data might be hidden     Relevant past medical, surgical, family and social history reviewed and updated as indicated. Interim medical history since our last visit reviewed. Allergies and medications reviewed and updated.  Review of Systems  Constitutional: Negative.   Respiratory: Negative.   Cardiovascular: Negative.   Gastrointestinal: Negative.   Musculoskeletal: Negative.   Neurological: Negative.   Psychiatric/Behavioral: Negative.     Per HPI unless specifically indicated above     Objective:    BP 138/75   Pulse 68   Temp 98 F (36.7 C) (Oral)   Ht 5\' 7"  (1.702 m)   Wt 173 lb 3.2 oz (78.6 kg)   SpO2 99%   BMI 27.13 kg/m   Wt Readings from Last 3 Encounters:  07/10/20 173 lb 3.2 oz (78.6 kg)  05/03/20 174 lb (78.9 kg)  04/09/20 174 lb 9.6 oz (79.2 kg)    Physical Exam Vitals and nursing note reviewed.  Constitutional:      General: He is not in acute distress.    Appearance: Normal appearance. He is not ill-appearing, toxic-appearing or diaphoretic.  HENT:     Head: Normocephalic and atraumatic.     Right Ear: External ear normal.     Left Ear: External ear  normal.     Nose: Nose normal.     Mouth/Throat:     Mouth: Mucous membranes are moist.     Pharynx: Oropharynx is clear.  Eyes:     General: No scleral icterus.       Right eye: No discharge.        Left eye: No discharge.     Extraocular Movements: Extraocular movements intact.     Conjunctiva/sclera: Conjunctivae normal.     Pupils: Pupils are equal, round, and reactive to light.  Cardiovascular:     Rate and Rhythm: Normal rate and regular rhythm.     Pulses: Normal pulses.     Heart sounds: Normal heart sounds. No murmur heard.  No friction rub. No gallop.   Pulmonary:     Effort: Pulmonary effort is  normal. No respiratory distress.     Breath sounds: Normal breath sounds. No stridor. No wheezing, rhonchi or rales.  Chest:     Chest wall: No tenderness.  Musculoskeletal:        General: Normal range of motion.     Cervical back: Normal range of motion and neck supple.  Skin:    General: Skin is warm and dry.     Capillary Refill: Capillary refill takes less than 2 seconds.     Coloration: Skin is not jaundiced or pale.     Findings: No bruising, erythema, lesion or rash.  Neurological:     General: No focal deficit present.     Mental Status: He is alert and oriented to person, place, and time. Mental status is at baseline.  Psychiatric:        Mood and Affect: Mood normal.        Behavior: Behavior normal.        Thought Content: Thought content normal.        Judgment: Judgment normal.     Results for orders placed or performed in visit on 05/03/20  Uric acid  Result Value Ref Range   Uric Acid 10.0 (H) 3.8 - 8.4 mg/dL  Comprehensive metabolic panel  Result Value Ref Range   Glucose 85 65 - 99 mg/dL   BUN 48 (H) 8 - 27 mg/dL   Creatinine, Ser 4.40 (H) 0.76 - 1.27 mg/dL   GFR calc non Af Amer 13 (L) >59 mL/min/1.73   GFR calc Af Amer 15 (L) >59 mL/min/1.73   BUN/Creatinine Ratio 11 10 - 24   Sodium 137 134 - 144 mmol/L   Potassium 4.8 3.5 - 5.2 mmol/L   Chloride 101 96 - 106 mmol/L   CO2 19 (L) 20 - 29 mmol/L   Calcium 8.7 8.6 - 10.2 mg/dL   Total Protein 6.0 6.0 - 8.5 g/dL   Albumin 4.0 3.8 - 4.8 g/dL   Globulin, Total 2.0 1.5 - 4.5 g/dL   Albumin/Globulin Ratio 2.0 1.2 - 2.2   Bilirubin Total 0.4 0.0 - 1.2 mg/dL   Alkaline Phosphatase 58 48 - 121 IU/L   AST 11 0 - 40 IU/L   ALT 9 0 - 44 IU/L      Assessment & Plan:   Problem List Items Addressed This Visit      Cardiovascular and Mediastinum   Hypertensive heart and chronic kidney disease with heart failure and with stage 5 chronic kidney disease, or end stage renal disease ([place])    Continue to follow  with nephrology. Stable on dialysis. Under good control on current regimen. Continue current regimen. Continue to monitor.  Call with any concerns. Refills given. Labs drawn today.       Relevant Medications   losartan (COZAAR) 50 MG tablet   furosemide (LASIX) 20 MG tablet   amLODipine (NORVASC) 10 MG tablet   carvedilol (COREG) 12.5 MG tablet     Genitourinary   Benign hypertensive renal disease - Primary    Under good control on current regimen. Continue current regimen. Continue to monitor. Call with any concerns. Refills given. Labs drawn today.        Relevant Medications   furosemide (LASIX) 20 MG tablet   Other Relevant Orders   CBC with Differential/Platelet   Comprehensive metabolic panel   Microalbumin, Urine Waived   CKD (chronic kidney disease) stage 5, GFR less than 15 ml/min (HCC)    Stable. Continue to follow with dialysis. Call with any concerns. Continue to monitor.       Relevant Orders   CBC with Differential/Platelet   Comprehensive metabolic panel   Microalbumin, Urine Waived   Urinalysis, Routine w reflex microscopic     Other   Hyperlipidemia    Under good control on current regimen. Continue current regimen. Continue to monitor. Call with any concerns. Refills given. Labs drawn today.        Relevant Medications   losartan (COZAAR) 50 MG tablet   furosemide (LASIX) 20 MG tablet   amLODipine (NORVASC) 10 MG tablet   carvedilol (COREG) 12.5 MG tablet   Other Relevant Orders   CBC with Differential/Platelet   Comprehensive metabolic panel   Lipid Panel w/o Chol/HDL Ratio   Major depression, recurrent (HCC)    Under good control on current regimen. Continue current regimen. Continue to monitor. Call with any concerns. Refills given. Labs drawn today.        Relevant Orders   CBC with Differential/Platelet   Comprehensive metabolic panel   TSH   Gout    Under good control on current regimen. Continue current regimen. Continue to monitor.  Call with any concerns. Refills given. Labs drawn today.        Relevant Orders   CBC with Differential/Platelet   Comprehensive metabolic panel   Uric acid       Follow up plan: Return in about 6 months (around 01/08/2021) for physical.

## 2020-07-11 DIAGNOSIS — N186 End stage renal disease: Secondary | ICD-10-CM | POA: Diagnosis not present

## 2020-07-11 DIAGNOSIS — Z992 Dependence on renal dialysis: Secondary | ICD-10-CM | POA: Diagnosis not present

## 2020-07-11 LAB — COMPREHENSIVE METABOLIC PANEL
ALT: 16 IU/L (ref 0–44)
AST: 15 IU/L (ref 0–40)
Albumin/Globulin Ratio: 2.2 (ref 1.2–2.2)
Albumin: 4.1 g/dL (ref 3.8–4.8)
Alkaline Phosphatase: 55 IU/L (ref 44–121)
BUN/Creatinine Ratio: 9 — ABNORMAL LOW (ref 10–24)
BUN: 45 mg/dL — ABNORMAL HIGH (ref 8–27)
Bilirubin Total: 0.4 mg/dL (ref 0.0–1.2)
CO2: 21 mmol/L (ref 20–29)
Calcium: 8.6 mg/dL (ref 8.6–10.2)
Chloride: 102 mmol/L (ref 96–106)
Creatinine, Ser: 4.77 mg/dL — ABNORMAL HIGH (ref 0.76–1.27)
GFR calc Af Amer: 14 mL/min/{1.73_m2} — ABNORMAL LOW (ref >59)
GFR calc non Af Amer: 12 mL/min/{1.73_m2} — ABNORMAL LOW (ref >59)
Globulin, Total: 1.9 g/dL (ref 1.5–4.5)
Glucose: 97 mg/dL (ref 65–99)
Potassium: 5.2 mmol/L (ref 3.5–5.2)
Sodium: 136 mmol/L (ref 134–144)
Total Protein: 6 g/dL (ref 6.0–8.5)

## 2020-07-11 LAB — LIPID PANEL W/O CHOL/HDL RATIO
Cholesterol, Total: 205 mg/dL — ABNORMAL HIGH (ref 100–199)
HDL: 33 mg/dL — ABNORMAL LOW (ref >39)
LDL Chol Calc (NIH): 140 mg/dL — ABNORMAL HIGH (ref 0–99)
Triglycerides: 179 mg/dL — ABNORMAL HIGH (ref 0–149)
VLDL Cholesterol Cal: 32 mg/dL (ref 5–40)

## 2020-07-11 LAB — CBC WITH DIFFERENTIAL/PLATELET
Basophils Absolute: 0 10*3/uL (ref 0.0–0.2)
Basos: 0 %
EOS (ABSOLUTE): 0 10*3/uL (ref 0.0–0.4)
Eos: 1 %
Hematocrit: 37.3 % — ABNORMAL LOW (ref 37.5–51.0)
Hemoglobin: 12 g/dL — ABNORMAL LOW (ref 13.0–17.7)
Immature Grans (Abs): 0 10*3/uL (ref 0.0–0.1)
Immature Granulocytes: 1 %
Lymphocytes Absolute: 0.3 10*3/uL — ABNORMAL LOW (ref 0.7–3.1)
Lymphs: 6 %
MCH: 29.5 pg (ref 26.6–33.0)
MCHC: 32.2 g/dL (ref 31.5–35.7)
MCV: 92 fL (ref 79–97)
Monocytes Absolute: 0.2 10*3/uL (ref 0.1–0.9)
Monocytes: 3 %
Neutrophils Absolute: 5.3 10*3/uL (ref 1.4–7.0)
Neutrophils: 89 %
Platelets: 129 10*3/uL — ABNORMAL LOW (ref 150–450)
RBC: 4.07 x10E6/uL — ABNORMAL LOW (ref 4.14–5.80)
RDW: 15.5 % — ABNORMAL HIGH (ref 11.6–15.4)
WBC: 5.8 10*3/uL (ref 3.4–10.8)

## 2020-07-11 LAB — URIC ACID: Uric Acid: 9.1 mg/dL — ABNORMAL HIGH (ref 3.8–8.4)

## 2020-07-11 LAB — TSH: TSH: 1.9 u[IU]/mL (ref 0.450–4.500)

## 2020-07-12 DIAGNOSIS — Z992 Dependence on renal dialysis: Secondary | ICD-10-CM | POA: Diagnosis not present

## 2020-07-12 DIAGNOSIS — N186 End stage renal disease: Secondary | ICD-10-CM | POA: Diagnosis not present

## 2020-07-13 DIAGNOSIS — Z992 Dependence on renal dialysis: Secondary | ICD-10-CM | POA: Diagnosis not present

## 2020-07-13 DIAGNOSIS — N186 End stage renal disease: Secondary | ICD-10-CM | POA: Diagnosis not present

## 2020-07-15 DIAGNOSIS — N186 End stage renal disease: Secondary | ICD-10-CM | POA: Diagnosis not present

## 2020-07-15 DIAGNOSIS — Z992 Dependence on renal dialysis: Secondary | ICD-10-CM | POA: Diagnosis not present

## 2020-07-16 DIAGNOSIS — Z992 Dependence on renal dialysis: Secondary | ICD-10-CM | POA: Diagnosis not present

## 2020-07-16 DIAGNOSIS — N186 End stage renal disease: Secondary | ICD-10-CM | POA: Diagnosis not present

## 2020-07-17 DIAGNOSIS — N186 End stage renal disease: Secondary | ICD-10-CM | POA: Diagnosis not present

## 2020-07-17 DIAGNOSIS — Z992 Dependence on renal dialysis: Secondary | ICD-10-CM | POA: Diagnosis not present

## 2020-07-18 DIAGNOSIS — Z992 Dependence on renal dialysis: Secondary | ICD-10-CM | POA: Diagnosis not present

## 2020-07-18 DIAGNOSIS — N186 End stage renal disease: Secondary | ICD-10-CM | POA: Diagnosis not present

## 2020-07-19 DIAGNOSIS — N186 End stage renal disease: Secondary | ICD-10-CM | POA: Diagnosis not present

## 2020-07-19 DIAGNOSIS — Z992 Dependence on renal dialysis: Secondary | ICD-10-CM | POA: Diagnosis not present

## 2020-07-20 DIAGNOSIS — N186 End stage renal disease: Secondary | ICD-10-CM | POA: Diagnosis not present

## 2020-07-20 DIAGNOSIS — Z992 Dependence on renal dialysis: Secondary | ICD-10-CM | POA: Diagnosis not present

## 2020-07-22 DIAGNOSIS — N186 End stage renal disease: Secondary | ICD-10-CM | POA: Diagnosis not present

## 2020-07-22 DIAGNOSIS — Z992 Dependence on renal dialysis: Secondary | ICD-10-CM | POA: Diagnosis not present

## 2020-07-23 DIAGNOSIS — Z992 Dependence on renal dialysis: Secondary | ICD-10-CM | POA: Diagnosis not present

## 2020-07-23 DIAGNOSIS — N186 End stage renal disease: Secondary | ICD-10-CM | POA: Diagnosis not present

## 2020-07-24 DIAGNOSIS — N186 End stage renal disease: Secondary | ICD-10-CM | POA: Diagnosis not present

## 2020-07-24 DIAGNOSIS — Z992 Dependence on renal dialysis: Secondary | ICD-10-CM | POA: Diagnosis not present

## 2020-07-25 DIAGNOSIS — N186 End stage renal disease: Secondary | ICD-10-CM | POA: Diagnosis not present

## 2020-07-25 DIAGNOSIS — Z992 Dependence on renal dialysis: Secondary | ICD-10-CM | POA: Diagnosis not present

## 2020-07-26 ENCOUNTER — Emergency Department: Payer: Medicare HMO

## 2020-07-26 ENCOUNTER — Emergency Department
Admission: EM | Admit: 2020-07-26 | Discharge: 2020-07-26 | Disposition: A | Discharge status: Home / Self Care | Payer: Medicare HMO | Attending: Emergency Medicine | Admitting: Emergency Medicine

## 2020-07-26 ENCOUNTER — Encounter: Payer: Self-pay | Admitting: Intensive Care

## 2020-07-26 ENCOUNTER — Other Ambulatory Visit: Payer: Self-pay

## 2020-07-26 DIAGNOSIS — Z951 Presence of aortocoronary bypass graft: Secondary | ICD-10-CM | POA: Diagnosis not present

## 2020-07-26 DIAGNOSIS — I10 Essential (primary) hypertension: Secondary | ICD-10-CM | POA: Diagnosis not present

## 2020-07-26 DIAGNOSIS — F17210 Nicotine dependence, cigarettes, uncomplicated: Secondary | ICD-10-CM | POA: Diagnosis not present

## 2020-07-26 DIAGNOSIS — R0602 Shortness of breath: Secondary | ICD-10-CM | POA: Diagnosis not present

## 2020-07-26 DIAGNOSIS — I132 Hypertensive heart and chronic kidney disease with heart failure and with stage 5 chronic kidney disease, or end stage renal disease: Secondary | ICD-10-CM | POA: Diagnosis not present

## 2020-07-26 DIAGNOSIS — J441 Chronic obstructive pulmonary disease with (acute) exacerbation: Secondary | ICD-10-CM | POA: Diagnosis not present

## 2020-07-26 DIAGNOSIS — Z992 Dependence on renal dialysis: Secondary | ICD-10-CM | POA: Diagnosis not present

## 2020-07-26 DIAGNOSIS — Z79899 Other long term (current) drug therapy: Secondary | ICD-10-CM | POA: Insufficient documentation

## 2020-07-26 DIAGNOSIS — Z955 Presence of coronary angioplasty implant and graft: Secondary | ICD-10-CM | POA: Diagnosis not present

## 2020-07-26 DIAGNOSIS — I509 Heart failure, unspecified: Secondary | ICD-10-CM | POA: Diagnosis not present

## 2020-07-26 DIAGNOSIS — I208 Other forms of angina pectoris: Secondary | ICD-10-CM

## 2020-07-26 DIAGNOSIS — N186 End stage renal disease: Secondary | ICD-10-CM | POA: Diagnosis not present

## 2020-07-26 DIAGNOSIS — N185 Chronic kidney disease, stage 5: Secondary | ICD-10-CM | POA: Insufficient documentation

## 2020-07-26 DIAGNOSIS — J449 Chronic obstructive pulmonary disease, unspecified: Secondary | ICD-10-CM | POA: Diagnosis not present

## 2020-07-26 DIAGNOSIS — I25119 Atherosclerotic heart disease of native coronary artery with unspecified angina pectoris: Secondary | ICD-10-CM | POA: Diagnosis not present

## 2020-07-26 DIAGNOSIS — R0789 Other chest pain: Secondary | ICD-10-CM | POA: Diagnosis not present

## 2020-07-26 DIAGNOSIS — Z7982 Long term (current) use of aspirin: Secondary | ICD-10-CM | POA: Diagnosis not present

## 2020-07-26 DIAGNOSIS — R079 Chest pain, unspecified: Secondary | ICD-10-CM | POA: Diagnosis not present

## 2020-07-26 DIAGNOSIS — R0902 Hypoxemia: Secondary | ICD-10-CM | POA: Diagnosis not present

## 2020-07-26 LAB — CBC
HCT: 34 % — ABNORMAL LOW (ref 39.0–52.0)
Hemoglobin: 11.3 g/dL — ABNORMAL LOW (ref 13.0–17.0)
MCH: 29.4 pg (ref 26.0–34.0)
MCHC: 33.2 g/dL (ref 30.0–36.0)
MCV: 88.5 fL (ref 80.0–100.0)
Platelets: 154 10*3/uL (ref 150–400)
RBC: 3.84 MIL/uL — ABNORMAL LOW (ref 4.22–5.81)
RDW: 16 % — ABNORMAL HIGH (ref 11.5–15.5)
WBC: 7.8 10*3/uL (ref 4.0–10.5)
nRBC: 0.9 % — ABNORMAL HIGH (ref 0.0–0.2)

## 2020-07-26 LAB — BASIC METABOLIC PANEL
Anion gap: 11 (ref 5–15)
BUN: 44 mg/dL — ABNORMAL HIGH (ref 8–23)
CO2: 21 mmol/L — ABNORMAL LOW (ref 22–32)
Calcium: 8.6 mg/dL — ABNORMAL LOW (ref 8.9–10.3)
Chloride: 101 mmol/L (ref 98–111)
Creatinine, Ser: 5.5 mg/dL — ABNORMAL HIGH (ref 0.61–1.24)
GFR, Estimated: 11 mL/min — ABNORMAL LOW (ref >60)
Glucose, Bld: 126 mg/dL — ABNORMAL HIGH (ref 70–99)
Potassium: 4.7 mmol/L (ref 3.5–5.1)
Sodium: 133 mmol/L — ABNORMAL LOW (ref 135–145)

## 2020-07-26 LAB — TROPONIN I (HIGH SENSITIVITY)
Troponin I (High Sensitivity): 36 ng/L — ABNORMAL HIGH (ref <18)
Troponin I (High Sensitivity): 32 ng/L — ABNORMAL HIGH (ref <18)

## 2020-07-26 MED ORDER — PREDNISONE 50 MG PO TABS: 50.0000 mg | ORAL_TABLET | Freq: Every day | ORAL | 0 refills | Status: DC

## 2020-07-26 MED ORDER — METHYLPREDNISOLONE SODIUM SUCC 125 MG IJ SOLR
80.0000 mg | Freq: Once | INTRAMUSCULAR | Status: AC
  Administered 2020-07-26: 80 mg via INTRAVENOUS
  Filled 2020-07-26: qty 2

## 2020-07-26 MED ORDER — ALBUTEROL SULFATE HFA 108 (90 BASE) MCG/ACT IN AERS
1.0000 | INHALATION_SPRAY | Freq: Once | RESPIRATORY_TRACT | Status: AC
  Administered 2020-07-26: 1 via RESPIRATORY_TRACT
  Filled 2020-07-26: qty 6.7

## 2020-07-26 MED ORDER — IPRATROPIUM BROMIDE 0.02 % IN SOLN
0.5000 mg | Freq: Once | RESPIRATORY_TRACT | Status: AC
  Administered 2020-07-26: 0.5 mg via RESPIRATORY_TRACT
  Filled 2020-07-26: qty 2.5

## 2020-07-26 MED ORDER — ALBUTEROL SULFATE (2.5 MG/3ML) 0.083% IN NEBU
10.0000 mg | INHALATION_SOLUTION | Freq: Once | RESPIRATORY_TRACT | Status: AC
  Administered 2020-07-26: 10 mg via RESPIRATORY_TRACT
  Filled 2020-07-26: qty 12

## 2020-07-26 NOTE — Discharge Instructions (Signed)
You were seen in the ED because of your chest pain and shortness of breath while walking.  As we discussed, this is more likely coming from your lungs than your heart, but it is important to follow-up with a cardiologist to discuss stress testing for your heart.  I have attached the phone number for Dr. Chancy Milroy, one of our local cardiologist who is on-call today, please call his office to set up an appointment to be seen in the clinic to discuss stress testing.  You are being discharged with a prescription for prednisone steroids.  Please pick up this prescription and start taking them tomorrow morning, Friday.  Take 1 pill/day for the next 4 days to help reduce the inflammation in your lungs.  You are being discharged with an albuterol inhaler to use as needed every 4-6 hours at home.  Use 2 puffs per dose.  If you feel well, you do not need to use this medication  Return to the ED with any significantly worsening symptoms.

## 2020-07-26 NOTE — ED Notes (Signed)
Pt ambulated at this time, 97% pre ambulation. Post ambulation sats 90-92%. No c/o SOB or CP at this time. Provider made aware.

## 2020-07-26 NOTE — ED Triage Notes (Addendum)
Pt c/o chest pain/heaviness for a few days. SOB with little exertion and fatigued. EMS administered 81mg  ASA and 1 nitro spray. Denies relief from nitro. Patient receives peritoneal dialysis X6 days a week

## 2020-07-26 NOTE — ED Provider Notes (Signed)
[facility] Emergency Department Provider Note ____________________________________________   First MD Initiated Contact with Patient 07/26/20 1526     (approximate)  I have reviewed the triage vital signs and the nursing notes.  HISTORY  Chief Complaint Chest Pain   HPI James Arnold is a 64 y.o. malewho presents to the ED for evaluation of chest pain.   Chart review indicates hx CAD s/p stenting, HTN, HLD, BPH, CKD 5 on home PD follows with Dr. Holley Raring. Myelofibrosis. Hx smoking and COPD.  Currently smoking 3/4 PPD and does not have any inhalers at home. Last stress test was 2.5 years ago and no evidence of ischemia.  He currently does not have a cardiologist to follow-up with.  Patient reports about 3 days of exertional chest heaviness with associated shortness of breath and nonproductive cough.  Patient reports his symptoms are primarily when he is getting up and walking around, indicates that he feels like he just ran a 100 yard dash.  He reports that his symptoms resolve after a few minutes with rest.  He reports a substernal chest pressure that radiates to his left arm with associated shortness of breath.  He reports his normal smoker's cough without increase in sputum production.  He denies chest pain while at rest, syncope, fever, trauma, worsening abdominal pain, emesis or stool changes.  Reports compliance with his home peritoneal dialysis, every day but Saturday is his typical regimen.  Past Medical History:  Diagnosis Date  . Acute renal failure (ARF) ([place]) 05/15/2017  . Anemia   . Benign hypertensive renal disease   . CAD (coronary artery disease)    a. 08/1996 s/p BMS to the mLAD ([facility]); b. 12/2017 MV: EF 46%, fixed apical defect w/ significant GI uptake artifact. No ischemia-> low risk.  . CKD (chronic kidney disease), stage IV ([place])   . Depression   . Diastolic dysfunction    a. 12/2017 Echo: EF 60-65%, no rwma, Gr1 DD, mild AI/MR. Nl RVSP.  [name] Kitchen  Dyspnea    with exertion  . FSGS (focal segmental glomerulosclerosis)   . Hyperlipidemia 10/2002  . Hypertension   . Myelofibrosis ([place]) 2010  . Smoker   . Thrombocytopenia [facility])     Patient Active Problem List   Diagnosis Date Noted  . Hypertensive heart and chronic kidney disease with heart failure and with stage 5 chronic kidney disease, or end stage renal disease ([place]) 07/10/2020  . Dialysis patient ([place]) 01/09/2020  . Senile purpura ([place]) 01/09/2020  . Centrilobular emphysema ([place]) 09/13/2019  . Secondary hyperparathyroidism ([place]) 03/14/2019  . 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  . Anemia of chronic kidney failure, stage 5 ([place]) 12/30/2017  . Proteinuria 11/25/2017  . Gout 08/27/2017  . Primary myelofibrosis ([place])   . Major depression, recurrent ([place])   . Benign hypertensive renal disease   . CKD (chronic kidney disease) stage 5, GFR less than 15 ml/min (HCC)   . Hyperlipidemia 10/23/2002  . CAD (coronary artery disease) 08/22/1996    Past Surgical History:  Procedure Laterality Date  . ANGIOPLASTY    . BONE MARROW ASPIRATION  03/2009  . CARDIAC CATHETERIZATION    . COLONOSCOPY WITH PROPOFOL N/A 08/09/2018   Procedure: COLONOSCOPY WITH PROPOFOL;  Surgeon: [name], MD;  Location: [facility] ENDOSCOPY;  Service: Gastroenterology;  Laterality: N/A;  . CORONARY ARTERY BYPASS GRAFT    . CORONARY STENT PLACEMENT  08/1996    Prior to Admission medications   Medication Sig Start Date End Date Taking?  Authorizing Provider  acetaminophen (TYLENOL) 500 MG tablet Take 1,000 mg by mouth every 6 (six) hours as needed.     [provider]  acyclovir (ZOVIRAX) 200 MG capsule Take 1 capsule (200 mg total) by mouth 2 (two) times daily. 12/22/19   Cammie Sickle, MD  amLODipine (NORVASC) 10 MG tablet Take 1 tablet (10 mg total) by mouth daily. 07/10/20   Johnson, Megan P, DO  aspirin EC 81 MG tablet Take by mouth.    [provider]  carvedilol  (COREG) 12.5 MG tablet Take 1 tablet (12.5 mg total) by mouth 2 (two) times daily. 07/10/20 10/08/20  Park Liter P, DO  Cholecalciferol (VITAMIN D-1000 MAX ST) 25 MCG (1000 UT) tablet Take 1,000 Units by mouth daily.     [provider]  citalopram (CELEXA) 40 MG tablet TAKE 1 TABLET EVERY DAY 06/20/20   Johnson, Megan P, DO  Colchicine (MITIGARE) 0.6 MG CAPS Take 1 capsule by mouth daily. 05/14/20   Johnson, Megan P, DO  colchicine 0.6 MG tablet Take 1 tablet (0.6 mg total) by mouth daily. 05/03/20   Johnson, Megan P, DO  epoetin alfa-epbx (RETACRIT) 32992 UNIT/ML injection Inject into the skin.    [provider]  ferrous sulfate 325 (65 FE) MG tablet Take 1 tablet (325 mg total) by mouth daily with breakfast. 12/30/17 04/25/21  Saundra Shelling, MD  furosemide (LASIX) 20 MG tablet Take 1 tablet (20 mg total) by mouth daily. 07/10/20   Johnson, Megan P, DO  gentamicin cream (GARAMYCIN) 0.1 % APP  CATHETER EXIT SITE D 03/18/19   [provider]  losartan (COZAAR) 50 MG tablet Take 1 tablet (50 mg total) by mouth daily. 07/10/20   Johnson, Megan P, DO  predniSONE (DELTASONE) 10 MG tablet Take 10 mg by mouth daily with breakfast.    [provider]  predniSONE (DELTASONE) 50 MG tablet Take 1 tablet (50 mg total) by mouth daily. 07/27/20   Vladimir Crofts, MD  traMADol (ULTRAM) 50 MG tablet Take 1 tablet (50 mg total) by mouth every 8 (eight) hours as needed. 10/08/18   Cammie Sickle, MD    Allergies Patient has no known allergies.  Family History  Problem Relation Age of Onset  . Stroke Mother        Low BP stroke  . Depression Father   . Depression Sister        Breast  . Heart disease Other   . Breast cancer Other   . Lung cancer Other   . Ovarian cancer Other   . Stomach cancer Other     Social History Social History   Tobacco Use  . Smoking status: Current Every Day Smoker    Packs/day: 1.50    Years: 47.00    Pack years: 70.50    Types:  Cigarettes  . Smokeless tobacco: Never Used  Vaping Use  . Vaping Use: Never used  Substance Use Topics  . Alcohol use: Yes    Alcohol/week: 7.0 standard drinks    Types: 7 Cans of beer per week  . Drug use: No    Review of Systems  Constitutional: No fever/chills Eyes: No visual changes. ENT: No sore throat. Cardiovascular: Positive chest pain chest pain. Respiratory: Positive for nonproductive cough and shortness of breath. Gastrointestinal: No abdominal pain.  No nausea, no vomiting.  No diarrhea.  No constipation. Genitourinary: Negative for dysuria. Musculoskeletal: Negative for back pain. Skin: Negative for rash. Neurological: Negative for headaches, focal  weakness or numbness.  ____________________________________________   PHYSICAL EXAM:  VITAL SIGNS: Vitals:   07/26/20 1600 07/26/20 1821  BP: (!) 143/79 134/78  Pulse: 77 78  Resp: 16 16  Temp: 98.1 F (36.7 C) 98.1 F (36.7 C)  SpO2: 97% 96%      Constitutional: Alert and oriented. Well appearing and in no acute distress.  Sitting up in bed, pleasant and conversational full sentences. Eyes: Conjunctivae are normal. PERRL. EOMI. Head: Atraumatic. Nose: No congestion/rhinnorhea. Mouth/Throat: Mucous membranes are moist.  Oropharynx non-erythematous. Neck: No stridor. No cervical spine tenderness to palpation. Cardiovascular: Normal rate, regular rhythm. Grossly normal heart sounds.  Good peripheral circulation. Respiratory: Normal respiratory effort.  No retractions.  Moderate air movement throughout and diffuse expiratory wheezes. Gastrointestinal: Soft , nondistended, nontender to palpation. No CVA tenderness. PD catheter in place underneath home Velcro strap Musculoskeletal: No lower extremity tenderness nor edema.  No joint effusions. No signs of acute trauma. Neurologic:  Normal speech and language. No gross focal neurologic deficits are appreciated. No gait instability noted. Skin:  Skin is warm, dry  and intact. No rash noted. Psychiatric: Mood and affect are normal. Speech and behavior are normal.  ____________________________________________   LABS (all labs ordered are listed, but only abnormal results are displayed)  Labs Reviewed  BASIC METABOLIC PANEL - Abnormal; Notable for the following components:      Result Value   Sodium 133 (*)    CO2 21 (*)    Glucose, Bld 126 (*)    BUN 44 (*)    Creatinine, Ser 5.50 (*)    Calcium 8.6 (*)    GFR, Estimated 11 (*)    All other components within normal limits  CBC - Abnormal; Notable for the following components:   RBC 3.84 (*)    Hemoglobin 11.3 (*)    HCT 34.0 (*)    RDW 16.0 (*)    nRBC 0.9 (*)    All other components within normal limits  TROPONIN I (HIGH SENSITIVITY) - Abnormal; Notable for the following components:   Troponin I (High Sensitivity) 36 (*)    All other components within normal limits  TROPONIN I (HIGH SENSITIVITY) - Abnormal; Notable for the following components:   Troponin I (High Sensitivity) 32 (*)    All other components within normal limits   ____________________________________________  12 Lead EKG  Sinus rhythm, rate 82 bpm.  Normal axis and intervals.  No evidence of acute ischemia. ____________________________________________  RADIOLOGY  ED MD interpretation: 2 view CXR with hyperinflation but no acute cardiopulmonary pathology.  Official radiology report(s): DG Chest 2 View  Result Date: 07/26/2020 CLINICAL DATA:  Chest pain short of breath. EXAM: CHEST - 2 VIEW COMPARISON:  03/20/2020 FINDINGS: COPD with pulmonary hyperinflation. Negative for heart failure, infiltrate, or effusion. Atherosclerotic aortic arch. IMPRESSION: COPD without acute  cardiopulmonary abnormality Electronically Signed   By: Franchot Gallo M.D.   On: 07/26/2020 14:51    ____________________________________________   PROCEDURES and INTERVENTIONS  Procedure(s) performed (including Critical Care):  .1-3 Lead  EKG Interpretation Performed by: Vladimir Crofts, MD Authorized by: Vladimir Crofts, MD     Interpretation: normal     ECG rate:  74   ECG rate assessment: normal     Rhythm: sinus rhythm     Ectopy: none     Conduction: normal      Medications  albuterol (PROVENTIL) (2.5 MG/3ML) 0.083% nebulizer solution 10 mg (10 mg Nebulization Given 07/26/20 1554)  ipratropium (ATROVENT) nebulizer solution  0.5 mg (0.5 mg Nebulization Given 07/26/20 1554)  methylPREDNISolone sodium succinate (SOLU-MEDROL) 125 mg/2 mL injection 80 mg (80 mg Intravenous Given 07/26/20 1554)  albuterol (VENTOLIN HFA) 108 (90 Base) MCG/ACT inhaler 1 puff (1 puff Inhalation Given 07/26/20 1819)    ____________________________________________   MDM / ED COURSE   64 year old man with history of longtime cigarette smoking and known CAD presents with exertional chest pain or shortness of breath, most consistent with COPD exacerbation superimposed on stable angina, amenable to outpatient management.  Normal vital signs on room air.  Exam demonstrates a well-appearing patient in no acute distress who has only moderate air movement throughout his pulmonary exam with diffuse expiratory wheezes, otherwise unremarkable.  CXR without infiltrates and patient has no increased pedal production to suggest an infectious process, and his CXR does demonstrate hyperinflation.  While he may not be formally diagnosed with COPD, his lifelong cigarette smoking and presentation today is consistent with a COPD exacerbation.  Patient reports improved ambulatory symptoms after breathing treatment and steroids.  His troponins are downtrending and EKG is nonischemic, suggesting no evidence of ACS, NSTEMI.  Patient reports he no longer has an albuterol inhaler at home, so we provide him with 1 and start him on a course of prednisone burst.  I provide him with outpatient follow-up information for cardiology to discuss further cardiac stress testing and we discussed  return precautions for the ED.  Patient medically stable for discharge home.   Clinical Course as of Jul 27 1827  Thu Jul 26, 2020  1646 Reassessed.  Patient reports he feels "about the same."  He indicates that he had no symptoms on presentation, and the symptoms are only with ambulation.  Reexamination of pulmonary auscultation reveals improved airflow and resolution of wheezing.  We discussed ambulation trial with pulse oximetry to help guide disposition.  We discussed his cardiac testing without evidence of acute ischemia, though his symptoms are concerning for stable angina.  We discussed that as long as his ambulation trial goes well and his symptoms are improving, plan for outpatient management with cardiology close follow-up and given the patient a new inhaler to go home with.   [DS]  9509 TO informs me of ambulation trial without hypoxia, distress or chest pain.  I am reassured that his symptoms are most likely pulmonary in nature from his COPD, and less likely to represent ACS.  All his symptoms that may represent stable angina, he has no indications for emergent cardiac consultation.  I discussed this with the patient, and he is agreeable with outpatient management with the addition of an albuterol inhaler to his regimen.  We discussed return precautions and we discussed following up with cardiology.   [DS]    Clinical Course User Index [DS] Vladimir Crofts, MD    ____________________________________________   FINAL CLINICAL IMPRESSION(S) / ED DIAGNOSES  Final diagnoses:  COPD exacerbation ([place])  Stable angina [facility])     ED Discharge Orders         Ordered    predniSONE (DELTASONE) 50 MG tablet  Daily        07/26/20 1712           Kyndal Heringer   Note:  This document was prepared using Dragon voice recognition software and may include unintentional dictation errors.   Vladimir Crofts, MD 07/26/20 [phone]

## 2020-07-26 NOTE — ED Triage Notes (Signed)
Pt comes into the ED via EMS from home with c/o week with cough and SOB, has chest pain with movement . ASA 81mg  pta, 1 nitro spray,  87HR 96RA, 166/76,

## 2020-07-27 DIAGNOSIS — Z992 Dependence on renal dialysis: Secondary | ICD-10-CM | POA: Diagnosis not present

## 2020-07-27 DIAGNOSIS — N186 End stage renal disease: Secondary | ICD-10-CM | POA: Diagnosis not present

## 2020-07-29 DIAGNOSIS — N186 End stage renal disease: Secondary | ICD-10-CM | POA: Diagnosis not present

## 2020-07-29 DIAGNOSIS — Z992 Dependence on renal dialysis: Secondary | ICD-10-CM | POA: Diagnosis not present

## 2020-07-30 DIAGNOSIS — Z992 Dependence on renal dialysis: Secondary | ICD-10-CM | POA: Diagnosis not present

## 2020-07-30 DIAGNOSIS — N186 End stage renal disease: Secondary | ICD-10-CM | POA: Diagnosis not present

## 2020-07-31 DIAGNOSIS — Z992 Dependence on renal dialysis: Secondary | ICD-10-CM | POA: Diagnosis not present

## 2020-07-31 DIAGNOSIS — N186 End stage renal disease: Secondary | ICD-10-CM | POA: Diagnosis not present

## 2020-08-01 DIAGNOSIS — Z992 Dependence on renal dialysis: Secondary | ICD-10-CM | POA: Diagnosis not present

## 2020-08-01 DIAGNOSIS — N186 End stage renal disease: Secondary | ICD-10-CM | POA: Diagnosis not present

## 2020-08-02 DIAGNOSIS — N186 End stage renal disease: Secondary | ICD-10-CM | POA: Diagnosis not present

## 2020-08-02 DIAGNOSIS — Z992 Dependence on renal dialysis: Secondary | ICD-10-CM | POA: Diagnosis not present

## 2020-08-03 DIAGNOSIS — N186 End stage renal disease: Secondary | ICD-10-CM | POA: Diagnosis not present

## 2020-08-03 DIAGNOSIS — Z992 Dependence on renal dialysis: Secondary | ICD-10-CM | POA: Diagnosis not present

## 2020-08-05 DIAGNOSIS — Z992 Dependence on renal dialysis: Secondary | ICD-10-CM | POA: Diagnosis not present

## 2020-08-05 DIAGNOSIS — N186 End stage renal disease: Secondary | ICD-10-CM | POA: Diagnosis not present

## 2020-08-06 DIAGNOSIS — Z992 Dependence on renal dialysis: Secondary | ICD-10-CM | POA: Diagnosis not present

## 2020-08-06 DIAGNOSIS — N186 End stage renal disease: Secondary | ICD-10-CM | POA: Diagnosis not present

## 2020-08-06 DIAGNOSIS — M12812 Other specific arthropathies, not elsewhere classified, left shoulder: Secondary | ICD-10-CM | POA: Diagnosis not present

## 2020-08-06 DIAGNOSIS — M25512 Pain in left shoulder: Secondary | ICD-10-CM | POA: Diagnosis not present

## 2020-08-07 DIAGNOSIS — M12812 Other specific arthropathies, not elsewhere classified, left shoulder: Secondary | ICD-10-CM | POA: Diagnosis not present

## 2020-08-07 DIAGNOSIS — N186 End stage renal disease: Secondary | ICD-10-CM | POA: Diagnosis not present

## 2020-08-07 DIAGNOSIS — Z992 Dependence on renal dialysis: Secondary | ICD-10-CM | POA: Diagnosis not present

## 2020-08-08 ENCOUNTER — Ambulatory Visit
Admission: RE | Admit: 2020-08-08 | Discharge: 2020-08-08 | Disposition: A | Discharge status: Home / Self Care | Payer: Medicare HMO | Source: Ambulatory Visit | Attending: Internal Medicine | Admitting: Internal Medicine

## 2020-08-08 ENCOUNTER — Other Ambulatory Visit: Payer: Self-pay

## 2020-08-08 DIAGNOSIS — N186 End stage renal disease: Secondary | ICD-10-CM | POA: Diagnosis not present

## 2020-08-08 DIAGNOSIS — R161 Splenomegaly, not elsewhere classified: Secondary | ICD-10-CM | POA: Diagnosis not present

## 2020-08-08 DIAGNOSIS — Z992 Dependence on renal dialysis: Secondary | ICD-10-CM | POA: Diagnosis not present

## 2020-08-08 DIAGNOSIS — D471 Chronic myeloproliferative disease: Secondary | ICD-10-CM

## 2020-08-09 DIAGNOSIS — Z992 Dependence on renal dialysis: Secondary | ICD-10-CM | POA: Diagnosis not present

## 2020-08-09 DIAGNOSIS — N186 End stage renal disease: Secondary | ICD-10-CM | POA: Diagnosis not present

## 2020-08-10 ENCOUNTER — Inpatient Hospital Stay (HOSPITAL_BASED_OUTPATIENT_CLINIC_OR_DEPARTMENT_OTHER): Payer: Medicare HMO | Admitting: Internal Medicine

## 2020-08-10 ENCOUNTER — Inpatient Hospital Stay: Payer: Medicare HMO | Attending: Internal Medicine

## 2020-08-10 ENCOUNTER — Encounter: Payer: Self-pay | Admitting: Internal Medicine

## 2020-08-10 ENCOUNTER — Other Ambulatory Visit: Payer: Self-pay

## 2020-08-10 VITALS — BP 164/76 | HR 73 | Temp 97.5°F | Resp 18 | Wt 174.6 lb

## 2020-08-10 DIAGNOSIS — D631 Anemia in chronic kidney disease: Secondary | ICD-10-CM | POA: Diagnosis not present

## 2020-08-10 DIAGNOSIS — D471 Chronic myeloproliferative disease: Secondary | ICD-10-CM

## 2020-08-10 DIAGNOSIS — Z7952 Long term (current) use of systemic steroids: Secondary | ICD-10-CM | POA: Diagnosis not present

## 2020-08-10 DIAGNOSIS — F17210 Nicotine dependence, cigarettes, uncomplicated: Secondary | ICD-10-CM | POA: Insufficient documentation

## 2020-08-10 DIAGNOSIS — Z992 Dependence on renal dialysis: Secondary | ICD-10-CM | POA: Diagnosis not present

## 2020-08-10 DIAGNOSIS — N186 End stage renal disease: Secondary | ICD-10-CM | POA: Diagnosis not present

## 2020-08-10 DIAGNOSIS — Z7982 Long term (current) use of aspirin: Secondary | ICD-10-CM | POA: Diagnosis not present

## 2020-08-10 DIAGNOSIS — Z803 Family history of malignant neoplasm of breast: Secondary | ICD-10-CM | POA: Diagnosis not present

## 2020-08-10 DIAGNOSIS — I2510 Atherosclerotic heart disease of native coronary artery without angina pectoris: Secondary | ICD-10-CM | POA: Insufficient documentation

## 2020-08-10 DIAGNOSIS — F32A Depression, unspecified: Secondary | ICD-10-CM | POA: Insufficient documentation

## 2020-08-10 DIAGNOSIS — Z8041 Family history of malignant neoplasm of ovary: Secondary | ICD-10-CM | POA: Diagnosis not present

## 2020-08-10 DIAGNOSIS — Z79899 Other long term (current) drug therapy: Secondary | ICD-10-CM | POA: Diagnosis not present

## 2020-08-10 DIAGNOSIS — Z8249 Family history of ischemic heart disease and other diseases of the circulatory system: Secondary | ICD-10-CM | POA: Insufficient documentation

## 2020-08-10 DIAGNOSIS — Z801 Family history of malignant neoplasm of trachea, bronchus and lung: Secondary | ICD-10-CM | POA: Insufficient documentation

## 2020-08-10 DIAGNOSIS — Z800 Family history of malignant neoplasm of digestive organs: Secondary | ICD-10-CM | POA: Diagnosis not present

## 2020-08-10 DIAGNOSIS — I120 Hypertensive chronic kidney disease with stage 5 chronic kidney disease or end stage renal disease: Secondary | ICD-10-CM | POA: Insufficient documentation

## 2020-08-10 LAB — CBC WITH DIFFERENTIAL/PLATELET
Abs Immature Granulocytes: 0.04 10*3/uL (ref 0.00–0.07)
Basophils Absolute: 0 10*3/uL (ref 0.0–0.1)
Basophils Relative: 0 %
Eosinophils Absolute: 0 10*3/uL (ref 0.0–0.5)
Eosinophils Relative: 0 %
HCT: 35.2 % — ABNORMAL LOW (ref 39.0–52.0)
Hemoglobin: 11.3 g/dL — ABNORMAL LOW (ref 13.0–17.0)
Immature Granulocytes: 1 %
Lymphocytes Relative: 3 %
Lymphs Abs: 0.2 10*3/uL — ABNORMAL LOW (ref 0.7–4.0)
MCH: 29 pg (ref 26.0–34.0)
MCHC: 32.1 g/dL (ref 30.0–36.0)
MCV: 90.3 fL (ref 80.0–100.0)
Monocytes Absolute: 0.2 10*3/uL (ref 0.1–1.0)
Monocytes Relative: 2 %
Neutro Abs: 6.7 10*3/uL (ref 1.7–7.7)
Neutrophils Relative %: 94 %
Platelets: 106 10*3/uL — ABNORMAL LOW (ref 150–400)
RBC: 3.9 MIL/uL — ABNORMAL LOW (ref 4.22–5.81)
RDW: 16.5 % — ABNORMAL HIGH (ref 11.5–15.5)
WBC: 7.1 10*3/uL (ref 4.0–10.5)
nRBC: 0 % (ref 0.0–0.2)

## 2020-08-10 LAB — COMPREHENSIVE METABOLIC PANEL
ALT: 12 U/L (ref 0–44)
AST: 14 U/L — ABNORMAL LOW (ref 15–41)
Albumin: 3.1 g/dL — ABNORMAL LOW (ref 3.5–5.0)
Alkaline Phosphatase: 43 U/L (ref 38–126)
Anion gap: 10 (ref 5–15)
BUN: 58 mg/dL — ABNORMAL HIGH (ref 8–23)
CO2: 23 mmol/L (ref 22–32)
Calcium: 7.8 mg/dL — ABNORMAL LOW (ref 8.9–10.3)
Chloride: 105 mmol/L (ref 98–111)
Creatinine, Ser: 5.38 mg/dL — ABNORMAL HIGH (ref 0.61–1.24)
GFR, Estimated: 11 mL/min — ABNORMAL LOW (ref >60)
Glucose, Bld: 107 mg/dL — ABNORMAL HIGH (ref 70–99)
Potassium: 5.2 mmol/L — ABNORMAL HIGH (ref 3.5–5.1)
Sodium: 138 mmol/L (ref 135–145)
Total Bilirubin: 0.6 mg/dL (ref 0.3–1.2)
Total Protein: 5.8 g/dL — ABNORMAL LOW (ref 6.5–8.1)

## 2020-08-10 LAB — LACTATE DEHYDROGENASE: LDH: 516 U/L — ABNORMAL HIGH (ref 98–192)

## 2020-08-10 NOTE — Progress Notes (Signed)
[name]  Patient Care Team: James Roys, DO as PCP - General (Family Medicine) End, James Gave, MD as PCP - Cardiology (Cardiology) James Sickle, MD as Consulting Physician (Internal Medicine)  Cancer Staging No matching staging information was found for the patient.   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   # AUGUST 1st week 2019- Retacrit   # October 2nd 2019- jakafi 10 BID [? 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 James Arnold 2019- kidney Bx- FSG [ on Prednisone JamesLateef]; July 2020-peritoneal dialysis;   # hx of Lung nodules- resolved [James Arnold]/quit smoking.  DIAGNOSIS: PRIMARY MYELOFIBROSIS  RISK:LOW     ;GOALS: Control  CURRENT/MOST RECENT THERAPY : Surveillance    Primary myelofibrosis ([place])      INTERVAL HISTORY:  James Arnold 64 y.o.  male pleasant patient above history of primary myelofibrosis Jak 2+ currently OFF Jakafi [since July 2021sec to intol]; chronic kidney disease on peritoneal dialysis is here for follow-up/review results of the ultrasound.  In the interim patient was evaluated in the emergency room for COPD exacerbation; acute MI ruled out.  Started on albuterol inhaler.  He continues to undergo peritoneal dialysis.  No infectious complications with abdominal pain.  No nausea vomiting.  He denies any worsening swelling in the legs.  Chronic mild to moderate fatigue not any worse.   Review of Systems  Constitutional: Positive for malaise/fatigue.  Negative for chills, diaphoresis, fever and weight loss.  HENT: Negative for nosebleeds and sore throat.   Eyes: Negative for double vision.  Respiratory: Positive for shortness of breath. Negative for cough, hemoptysis, sputum production and wheezing.   Cardiovascular: Negative for chest pain, palpitations and orthopnea.  Gastrointestinal: Negative for abdominal pain, blood in stool, constipation, diarrhea, heartburn, melena, nausea and vomiting.  Genitourinary: Negative for dysuria, frequency and urgency.  Musculoskeletal: Negative for back pain and joint pain.  Skin: Negative.  Negative for itching and rash.  Neurological: Negative for dizziness, tingling, focal weakness, weakness and headaches.  Endo/Heme/Allergies: Does not bruise/bleed easily.  Psychiatric/Behavioral: Negative for depression. The patient is not nervous/anxious and does not have insomnia.       PAST MEDICAL HISTORY :  Past Medical History:  Diagnosis Date  . Acute renal failure (ARF) ([name]) 05/15/2017  . Anemia   . Benign hypertensive renal disease   . CAD (coronary artery disease)    a. 08/1996 s/p BMS to the mLAD ([facility]); b. 12/2017 MV: EF 46%, fixed apical defect w/ significant GI uptake artifact. No ischemia-> low risk.  . CKD (chronic kidney disease), stage IV ([place])   . Depression   . Diastolic dysfunction    a. 12/2017 Echo: EF 60-65%, no rwma, Gr1 DD, mild AI/MR. Nl RVSP.  [name] Kitchen Dyspnea    with exertion  . FSGS (focal segmental glomerulosclerosis)   . Hyperlipidemia 10/2002  . Hypertension   . Myelofibrosis ([place]) 2010  . Smoker   . Thrombocytopenia ([place])     PAST SURGICAL HISTORY :   Past Surgical History:  Procedure Laterality Date  . ANGIOPLASTY    . BONE MARROW ASPIRATION  03/2009  . CARDIAC CATHETERIZATION    .  COLONOSCOPY WITH PROPOFOL N/A 08/09/2018   Procedure: COLONOSCOPY WITH PROPOFOL;  Surgeon: James Bellows, MD;  Location[facility] ENDOSCOPY;  Service: Gastroenterology;  Laterality: N/A;  . CORONARY  ARTERY BYPASS GRAFT    . CORONARY STENT PLACEMENT  08/1996    FAMILY HISTORY :   Family History  Problem Relation Age of Onset  . Stroke Mother        Low BP stroke  . Depression Father   . Depression Sister        Breast  . Heart disease Other   . Breast cancer Other   . Lung cancer Other   . Ovarian cancer Other   . Stomach cancer Other     SOCIAL HISTORY:   Social History   Tobacco Use  . Smoking status: Current Every Day Smoker    Packs/day: 1.50    Years: 47.00    Pack years: 70.50    Types: Cigarettes  . Smokeless tobacco: Never Used  Vaping Use  . Vaping Use: Never used  Substance Use Topics  . Alcohol use: Yes    Alcohol/week: 7.0 standard drinks    Types: 7 Cans of beer per week  . Drug use: No    ALLERGIES:  has No Known Allergies.  MEDICATIONS:  Current Outpatient Medications  Medication Sig Dispense Refill  . acetaminophen (TYLENOL) 500 MG tablet Take 1,000 mg by mouth every 6 (six) hours as needed.     [name] Kitchen amLODipine (NORVASC) 10 MG tablet Take 1 tablet (10 mg total) by mouth daily. 90 tablet 1  . aspirin EC 81 MG tablet Take by mouth.    . carvedilol (COREG) 12.5 MG tablet Take 1 tablet (12.5 mg total) by mouth 2 (two) times daily. 180 tablet 1  . Cholecalciferol (VITAMIN D-1000 MAX ST) 25 MCG (1000 UT) tablet Take 1,000 Units by mouth daily.     . citalopram (CELEXA) 40 MG tablet TAKE 1 TABLET EVERY DAY 90 tablet 1  . epoetin alfa-epbx (RETACRIT) 32671 UNIT/ML injection Inject into the skin.    . ferrous sulfate 325 (65 FE) MG tablet Take 1 tablet (325 mg total) by mouth daily with breakfast. 30 tablet 1  . furosemide (LASIX) 20 MG tablet Take 1 tablet (20 mg total) by mouth daily. 90 tablet 1  . gentamicin cream (GARAMYCIN) 0.1 % APP  CATHETER EXIT SITE D    . losartan (COZAAR) 50 MG tablet Take 1 tablet (50 mg total) by mouth daily. 90 tablet 1  . predniSONE (DELTASONE) 10 MG tablet Take 10 mg by mouth daily with breakfast.     No current  facility-administered medications for this visit.    PHYSICAL EXAMINATION: ECOG PERFORMANCE STATUS: 1 - Symptomatic but completely ambulatory  BP (!) 164/76 (BP Location: Right Arm, Patient Position: Sitting)   Pulse 73   Temp (!) 97.5 F (36.4 C) (Tympanic)   Resp 18   Wt 174 lb 9.6 oz (79.2 kg)   SpO2 99%   BMI 27.35 kg/m   Filed Weights   08/10/20 1039  Weight: 174 lb 9.6 oz (79.2 kg)    Physical Exam Constitutional:      Comments: He is alone.   HENT:     Head: Normocephalic and atraumatic.     Mouth/Throat:     Pharynx: No oropharyngeal exudate.  Eyes:     Pupils: Pupils are equal, round, and reactive to light.  Cardiovascular:     Rate and Rhythm: Normal rate and regular rhythm.  Pulmonary:  Effort: No respiratory distress.     Breath sounds: No wheezing.  Abdominal:     General: Bowel sounds are normal. There is no distension.     Palpations: Abdomen is soft. There is no mass.     Tenderness: There is no abdominal tenderness. There is no guarding or rebound.  Musculoskeletal:        General: No tenderness. Normal range of motion.     Cervical back: Normal range of motion and neck supple.  Skin:    General: Skin is warm.  Neurological:     Mental Status: He is alert and oriented to person, place, and time.  Psychiatric:        Mood and Affect: Affect normal.     LABORATORY DATA:  I have reviewed the data as listed    Component Value Date/Time   NA 138 08/10/2020 1002   NA 136 07/10/2020 0909   K 5.2 (H) 08/10/2020 1002   CL 105 08/10/2020 1002   CO2 23 08/10/2020 1002   GLUCOSE 107 (H) 08/10/2020 1002   BUN 58 (H) 08/10/2020 1002   BUN 45 (H) 07/10/2020 0909   CREATININE 5.38 (H) 08/10/2020 1002   CREATININE 1.12 09/30/2013 0953   CALCIUM 7.8 (L) 08/10/2020 1002   PROT 5.8 (L) 08/10/2020 1002   PROT 6.0 07/10/2020 0909   ALBUMIN 3.1 (L) 08/10/2020 1002   ALBUMIN 4.1 07/10/2020 0909   AST 14 (L) 08/10/2020 1002   ALT 12 08/10/2020 1002    ALKPHOS 43 08/10/2020 1002   BILITOT 0.6 08/10/2020 1002   BILITOT 0.4 07/10/2020 0909   GFRNONAA 11 (L) 08/10/2020 1002   GFRNONAA >60 09/30/2013 0953   GFRAA 14 (L) 07/10/2020 0909   GFRAA >60 09/30/2013 0953    No results found for: SPEP, UPEP  Lab Results  Component Value Date   WBC 7.1 08/10/2020   NEUTROABS 6.7 08/10/2020   HGB 11.3 (L) 08/10/2020   HCT 35.2 (L) 08/10/2020   MCV 90.3 08/10/2020   PLT 106 (L) 08/10/2020      Chemistry      Component Value Date/Time   NA 138 08/10/2020 1002   NA 136 07/10/2020 0909   K 5.2 (H) 08/10/2020 1002   CL 105 08/10/2020 1002   CO2 23 08/10/2020 1002   BUN 58 (H) 08/10/2020 1002   BUN 45 (H) 07/10/2020 0909   CREATININE 5.38 (H) 08/10/2020 1002   CREATININE 1.12 09/30/2013 0953      Component Value Date/Time   CALCIUM 7.8 (L) 08/10/2020 1002   ALKPHOS 43 08/10/2020 1002   AST 14 (L) 08/10/2020 1002   ALT 12 08/10/2020 1002   BILITOT 0.6 08/10/2020 1002   BILITOT 0.4 07/10/2020 0909       RADIOGRAPHIC STUDIES: I have personally reviewed the radiological images as listed and agreed with the findings in the report. No results found.   ASSESSMENT & PLAN:  Primary myelofibrosis ([place]) # Primary Myelofiborisis [Bone marrow 2010] jak-2 POSITIVE; currently on surveillance. OFF Jakafi because of intolerance.  Clinically stable.  # [place]  Cc-Baseline Feb 2019- 980cc;  July 3rd 2020-- 613; March 2021- 794; July 2021- 379; JAKAFI STOPPED/poor tolerance [Tapered off on July 8th 2021 ]. NOV 2021- 662cc.  Discussed the increase in size of the spleen; fairly asymptomatic. Recommend close surveillance with ultrasound every 4 months.  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  Hemoglobin 10; STABLE.   #CKD stage IV-V [Dr.  Latif]  on PD; STABLE-  # COVID vaccine: Patient is to unvaccinated to Covid-19.  Concerned about potential adverse events.  I again  educated the patient/and the wife regarding the concerns for extremely high risk of infection/serious complications from the new variants.  Benefits of Covid vaccine outweigh the risk at any time; and strongly recommend COVID-19 vaccination.  Wife/patient reluctant.  # DISPOSITION: # follow up in 4 months-labs- MD-CBC/CMP;LDH; US spleen prior--JamesB    Cc; JamesLateef /JamesJohnson   Orders Placed This Encounter  Procedures  . US SPLEEN (ABDOMEN LIMITED)    Standing Status:   Future    Standing Expiration Date:   08/10/2021    Order Specific Question:   Reason for Exam (SYMPTOM  OR DIAGNOSIS REQUIRED)    Answer:   splenomegaly/ myelofibrosis    Order Specific Question:   Preferred imaging location?    Answer:   [name]  . CBC with Differential    Standing Status:   Future    Standing Expiration Date:   08/10/2021  . Comprehensive metabolic panel    Standing Status:   Future    Standing Expiration Date:   08/10/2021  . Lactate dehydrogenase    Standing Status:   Future    Standing Expiration Date:   08/10/2021   All questions were answered. The patient knows to call the clinic with any problems, questions or concerns.     James Sickle, MD 08/12/2020 4:35 PM

## 2020-08-10 NOTE — Assessment & Plan Note (Addendum)
#   Primary Myelofiborisis [Bone marrow 2010] jak-2 POSITIVE; currently on surveillance. OFF Jakafi because of intolerance.  Clinically stable.  # [place]  Cc-Baseline Feb 2019- 980cc;  July 3rd 2020-- 613; March 2021- 794; July 2021- 379; JAKAFI STOPPED/poor tolerance [Tapered off on July 8th 2021 ]. NOV 2021- 662cc.  Discussed the increase in size of the spleen; fairly asymptomatic. Recommend close surveillance with ultrasound every 4 months.  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  Hemoglobin 10; STABLE.   #CKD stage IV-V [Dr. Latif]  on PD; STABLE-  # COVID vaccine: Patient is to unvaccinated to Covid-19.  Concerned about potential adverse events.  I again educated the patient/and the wife regarding the concerns for extremely high risk of infection/serious complications from the new variants.  Benefits of Covid vaccine outweigh the risk at any time; and strongly recommend COVID-19 vaccination.  Wife/patient reluctant.  # DISPOSITION: # follow up in 4 months-labs- MD-CBC/CMP;LDH; US spleen prior--Dr.B    Cc; Dr.[name] /Dr.Johnson

## 2020-08-12 DIAGNOSIS — Z992 Dependence on renal dialysis: Secondary | ICD-10-CM | POA: Diagnosis not present

## 2020-08-12 DIAGNOSIS — N186 End stage renal disease: Secondary | ICD-10-CM | POA: Diagnosis not present

## 2020-08-13 DIAGNOSIS — N186 End stage renal disease: Secondary | ICD-10-CM | POA: Diagnosis not present

## 2020-08-13 DIAGNOSIS — Z992 Dependence on renal dialysis: Secondary | ICD-10-CM | POA: Diagnosis not present

## 2020-08-14 DIAGNOSIS — Z992 Dependence on renal dialysis: Secondary | ICD-10-CM | POA: Diagnosis not present

## 2020-08-14 DIAGNOSIS — N186 End stage renal disease: Secondary | ICD-10-CM | POA: Diagnosis not present

## 2020-08-15 ENCOUNTER — Other Ambulatory Visit: Payer: Self-pay

## 2020-08-15 ENCOUNTER — Ambulatory Visit (INDEPENDENT_AMBULATORY_CARE_PROVIDER_SITE_OTHER): Payer: Medicare HMO | Admitting: Family

## 2020-08-15 ENCOUNTER — Encounter: Payer: Self-pay | Admitting: Family

## 2020-08-15 ENCOUNTER — Ambulatory Visit: Payer: Medicare HMO | Admitting: Internal Medicine

## 2020-08-15 VITALS — BP 142/76 | HR 68 | Ht 67.0 in | Wt 174.4 lb

## 2020-08-15 DIAGNOSIS — I10 Essential (primary) hypertension: Secondary | ICD-10-CM | POA: Diagnosis not present

## 2020-08-15 DIAGNOSIS — I25118 Atherosclerotic heart disease of native coronary artery with other forms of angina pectoris: Secondary | ICD-10-CM | POA: Diagnosis not present

## 2020-08-15 DIAGNOSIS — Z992 Dependence on renal dialysis: Secondary | ICD-10-CM | POA: Diagnosis not present

## 2020-08-15 DIAGNOSIS — Z720 Tobacco use: Secondary | ICD-10-CM | POA: Diagnosis not present

## 2020-08-15 DIAGNOSIS — E785 Hyperlipidemia, unspecified: Secondary | ICD-10-CM

## 2020-08-15 DIAGNOSIS — N184 Chronic kidney disease, stage 4 (severe): Secondary | ICD-10-CM | POA: Diagnosis not present

## 2020-08-15 DIAGNOSIS — R0600 Dyspnea, unspecified: Secondary | ICD-10-CM

## 2020-08-15 DIAGNOSIS — R0609 Other forms of dyspnea: Secondary | ICD-10-CM

## 2020-08-15 DIAGNOSIS — N186 End stage renal disease: Secondary | ICD-10-CM | POA: Diagnosis not present

## 2020-08-15 MED ORDER — ISOSORBIDE MONONITRATE ER 30 MG PO TB24: 30.0000 mg | ORAL_TABLET | Freq: Every day | ORAL | 0 refills | Status: DC

## 2020-08-15 NOTE — Patient Instructions (Addendum)
Medication Instructions:  Your physician has recommended you make the following change in your medication:   START Isosorbide Mononitrate (Imdur) 30mg  daily  *If you need a refill on your cardiac medications before your next appointment, please call your pharmacy*  Lab Work: No lab work today.  Testing/Procedures: Your physician has requested that you have a lexiscan myoview.  Please follow instruction sheet, as given.  Your physician has requested that you have an echocardiogram. Echocardiography is a painless test that uses sound waves to create images of your heart. It provides your doctor with information about the size and shape of your heart and how well your heart's chambers and valves are working. This procedure takes approximately one hour. There are no restrictions for this procedure.  Follow-Up: At [facility], you and your health needs are our priority.  As part of our continuing mission to provide you with exceptional heart care, we have created designated Provider Care Teams.  These Care Teams include your primary Cardiologist (physician) and Advanced Practice Providers (APPs -  Physician Assistants and Nurse Practitioners) who all work together to provide you with the care you need, when you need it.  We recommend signing up for the patient portal called "MyChart".  Sign up information is provided on this After Visit Summary.  MyChart is used to connect with patients for Virtual Visits (Telemedicine).  Patients are able to view lab/test results, encounter notes, upcoming appointments, etc.  Non-urgent messages can be sent to your provider as well.   To learn more about what you can do with MyChart, go to [email or web address].    Your next appointment:   4 week(s)  The format for your next appointment:   In Person  Provider:   You may see Nelva Bush, MD or one of the following Advanced Practice Providers on your designated Care Team:    Murray Hodgkins,  NP  Christell Faith, PA-C  Marrianne Mood, PA-C  Cadence Gifford, [place]  Laurann Montana, NP   Other Instructions  [place]  Your caregiver has ordered a Stress Test with nuclear imaging. The purpose of this test is to evaluate the blood supply to your heart muscle. This procedure is referred to as a "Non-Invasive Stress Test." This is because other than having an IV started in your vein, nothing is inserted or "invades" your body. Cardiac stress tests are done to find areas of poor blood flow to the heart by determining the extent of coronary artery disease (CAD). Some patients exercise on a treadmill, which naturally increases the blood flow to your heart, while others who are  unable to walk on a treadmill due to physical limitations have a pharmacologic/chemical stress agent called Lexiscan . This medicine will mimic walking on a treadmill by temporarily increasing your coronary blood flow.   Please note: these test may take anywhere between 2-4 hours to complete  PLEASE REPORT TO [facility] AT THE FIRST DESK WILL DIRECT YOU WHERE TO GO  Date of Procedure:_____________________________________  Arrival Time for Procedure:______________________________  Instructions regarding medication:  Hold am dose of Coreg and lasix.    PLEASE NOTIFY THE OFFICE AT LEAST 89 HOURS IN ADVANCE IF YOU ARE UNABLE TO KEEP YOUR APPOINTMENT.  [phone] AND  PLEASE NOTIFY NUCLEAR MEDICINE AT [facility] AT LEAST 24 HOURS IN ADVANCE IF YOU ARE UNABLE TO KEEP YOUR APPOINTMENT. [phone]  How to prepare for your Myoview test:  1. Do not eat or drink after midnight 2. No  caffeine for 24 hours prior to test 3. No smoking 24 hours prior to test. 4. Your medication may be taken with water.  If your doctor stopped a medication because of this test, do not take that medication. 5. Ladies, please do not wear dresses.  Skirts or pants are appropriate. Please wear a short sleeve  shirt. 6. No perfume, cologne or lotion. 7. Wear comfortable walking shoes. No heels!

## 2020-08-15 NOTE — Progress Notes (Addendum)
Office Visit    Patient Name: James Arnold Date of Encounter: 08/15/2020  Primary Care Provider:  Valerie Roys, DO Primary Cardiologist:  Nelva Bush, MD Electrophysiologist:  None   Chief Complaint    James Arnold is a 64 y.o. 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 Acute renal failure (ARF) ([place]) 05/15/2017  . Anemia   . Benign hypertensive renal disease   . CAD (coronary artery disease)    a. 08/1996 s/p BMS to the mLAD ([facility]); b. 12/2017 MV: EF 46%, fixed apical defect w/ significant GI uptake artifact. No ischemia-> low risk.  . CKD (chronic kidney disease), stage IV ([place])   . Depression   . Diastolic dysfunction    a. 12/2017 Echo: EF 60-65%, no rwma, Gr1 DD, mild AI/MR. Nl RVSP.  [name] Kitchen Dyspnea    with exertion  . FSGS (focal segmental glomerulosclerosis)   . Hyperlipidemia 10/2002  . Hypertension   . Myelofibrosis ([place]) 2010  . Smoker   . Thrombocytopenia ([place])    Past Surgical History:  Procedure Laterality Date  . ANGIOPLASTY    . BONE MARROW ASPIRATION  03/2009  . CARDIAC CATHETERIZATION    . COLONOSCOPY WITH PROPOFOL N/A 08/09/2018   Procedure: COLONOSCOPY WITH PROPOFOL;  Surgeon: [name], MD;  Location[facility] ENDOSCOPY;  Service: Gastroenterology;  Laterality: N/A;  . CORONARY ARTERY BYPASS GRAFT    . CORONARY STENT PLACEMENT  08/1996    Allergies  No Known Allergies  History of Present Illness    James Arnold is a 64 y.o. 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  He was last seen by Ignacia Bayley, NP 01/28/18.  Cardiac history notable for CAD s/p bare-metal stenting of mid LAD in 1997 at [facility].  He was hospitalized in 2019 with chest  pain underwent echocardiogram which showed normal LV function and grade 1 diastolic dysfunction.  Stress test was low risk/nonischemic.  He was seen in follow-up May 2019 with no recurrent anginal symptoms.  His carvedilol was increased to 12.5 mg twice daily due to elevated blood pressure.  He has since been lost to follow-up.  He was in the emergency department at [facility] 07/26/2020 after presenting with chest pain.  He notes 3-day history of exertional chest heaviness associate with shortness of breath and nonproductive cough.  Chest x-ray with out infiltrate.  Per ED MD his presentation was consistent with COPD exacerbation. HS troponin of 36 and 32. His symptoms improved with breathing treatments and steroids.  He does home peritoneal dialysis every day but Saturday.  Presents today for follow-up with his wife.  Tells me he continues to have ongoing dyspnea on exertion and sensation of chest "just pounding ".  This has been ongoing for a few months.  Symptoms typically last 10 months or longer and occur with activity.  They resolve with rest.  Tells me symptoms have been stable over the last few months.    He completed the course of prednisone that the ED provider has given him.  Feels no different.  He has not used the albuterol inhaler.  EKGs/Labs/Other Studies Reviewed:   The following studies were reviewed today:  Echo 12/2017 Left ventricle:  The cavity size was normal. There was mild    concentric hypertrophy. Systolic function was normal. The    estimated ejection fraction was in the range of 60% to 65%. Wall    motion was normal; there were no regional wall motion    abnormalities. Doppler parameters are consistent with abnormal    left ventricular relaxation (grade 1 diastolic dysfunction).  - Aortic valve: There was mild regurgitation.  - Mitral valve: There was mild regurgitation.  - Left atrium: The atrium was normal in size.  - Right ventricle: Systolic function was normal.  -  Pulmonary arteries: Systolic pressure was within the normal    range.   EKG:  EKG is ordered today.  The ekg ordered today demonstrates NSR 68 bpm with minimal voltage criteria for LVH.  No acute ST/T wave changes.  Recent Labs: 07/10/2020: TSH 1.900 08/10/2020: ALT 12; BUN 58; Creatinine, Ser 5.38; Hemoglobin 11.3; Platelets 106; Potassium 5.2; Sodium 138  Recent Lipid Panel    Component Value Date/Time   CHOL 205 (H) 07/10/2020 0909   TRIG 179 (H) 07/10/2020 0909   HDL 33 (L) 07/10/2020 0909   CHOLHDL 7.7 12/30/2017 0435   VLDL 50 (H) 12/30/2017 0435   LDLCALC 140 (H) 07/10/2020 0909   Home Medications   Current Meds  Medication Sig  . acetaminophen (TYLENOL) 500 MG tablet Take 1,000 mg by mouth every 6 (six) hours as needed.   [name] Kitchen amLODipine (NORVASC) 10 MG tablet Take 1 tablet (10 mg total) by mouth daily.  [name] Kitchen aspirin EC 81 MG tablet Take by mouth.  . carvedilol (COREG) 12.5 MG tablet Take 1 tablet (12.5 mg total) by mouth 2 (two) times daily.  . Cholecalciferol (VITAMIN D-1000 MAX ST) 25 MCG (1000 UT) tablet Take 1,000 Units by mouth daily.   . citalopram (CELEXA) 40 MG tablet TAKE 1 TABLET EVERY DAY  . epoetin alfa-epbx (RETACRIT) 96789 UNIT/ML injection Inject into the skin.  . ferrous sulfate 325 (65 FE) MG tablet Take 1 tablet (325 mg total) by mouth daily with breakfast.  . furosemide (LASIX) 20 MG tablet Take 1 tablet (20 mg total) by mouth daily.  [name] Kitchen gentamicin cream (GARAMYCIN) 0.1 % APP  CATHETER EXIT SITE D  . losartan (COZAAR) 50 MG tablet Take 1 tablet (50 mg total) by mouth daily.  . predniSONE (DELTASONE) 10 MG tablet Take 10 mg by mouth daily with breakfast.    Review of Systems    Review of Systems  Constitutional: Negative for chills, fever and malaise/fatigue.  Cardiovascular: Positive for chest pain and dyspnea on exertion. Negative for leg swelling, near-syncope, orthopnea, palpitations and syncope.  Respiratory: Positive for shortness of breath. Negative  for cough and wheezing.   Gastrointestinal: Negative for nausea and vomiting.  Neurological: Negative for dizziness, light-headedness and weakness.   All other systems reviewed and are otherwise negative except as noted above.  Physical Exam    VS:  BP (!) 142/76   Pulse 68   Ht 5\' 7"  (1.702 m)   Wt 174 lb 6.4 oz (79.1 kg)   SpO2 100%   BMI 27.31 kg/m  , BMI Body mass index is 27.31 kg/m.  Wt Readings from Last 3 Encounters:  08/15/20 174 lb 6.4 oz (79.1 kg)  08/10/20 174 lb 9.6 oz (79.2 kg)  07/26/20 167 lb (75.8 kg)    GEN: Well nourished, well developed, in no aute distress. HEENT: normal. Neck: Supple, no JVD, carotid bruits, or masses. Cardiac: RRR, no murmurs,  rubs, or gallops. No clubbing, cyanosis, edema.  Radials/DP/PT 2+ and equal bilaterally.  Respiratory:  Respirations regular and unlabored.  No that we have GI: Soft, nontender, nondistended. MS: No deformity or atrophy. Skin: Warm and dry, no rash. Neuro:  Strength and sensation are intact. Psych: Normal affect.  Assessment & Plan    1. DOE - Consider etiology likely COPD due to longstanding tobacco use vs coronary disease vs heart failure. Plan for [facility] and echocardiogram.  If cardiac work-up unrevealing plan for referral to pulmonology.  2. CAD- Reports dyspnea on exertion and chest discomfort with exertion over the last few months.  Typically lasts 10 minutes and resolves with Present GDMT includes aspirin, beta-blocker.  Add Imdur 30 mg daily. Plan for [facility]. The risks [chest pain, shortness of breath, cardiac arrhythmias, dizziness, blood pressure fluctuations, myocardial infarction, stroke/transient ischemic attack, nausea, vomiting, allergic reaction, radiation exposure, metallic taste sensation and life-threatening complications (estimated to be 1 in 10,000)], benefits (risk stratification, diagnosing coronary artery disease, treatment guidance) and alternatives of a nuclear stress test  were discussed in detail with Mr. Ketelsen and he agrees to proceed.  3. Tobacco use- Not interested in quitting at this time. Smoking cessation encouraged. Recommend utilization of 1800QUITNOW.  4. HTN-BP mildly elevated today.  Reports BP consistently elevated at home.  Start Imdur 30 mg daily. Continue Coreg 12.5 mg twice daily, amlodipine 10 mg daily, losartan 50 mg daily.  5. HLD, LDL goal less than 70-Declines lipid-lowering therapy including statins as he shares with me his wife had complications after taking cholesterol medication.  Lab work 07/10/2020 with LDL 140.  Continue to discuss at follow-up, consider Zetia or PCSK9 I.  6. CKD IV/V -continue to follow with nephrology.  On home peritoneal dialysis.  Of note, he is presently on Lasix 20 mg daily and in the setting of peritoneal dialysis low suspicion this is of significant benefit.  Disposition: Follow up in 4 week(s) with Dr. Saunders Revel or APP  Signed, Loel Dubonnet, NP 08/15/2020, 9:31 AM [place]

## 2020-08-16 DIAGNOSIS — Z992 Dependence on renal dialysis: Secondary | ICD-10-CM | POA: Diagnosis not present

## 2020-08-16 DIAGNOSIS — N186 End stage renal disease: Secondary | ICD-10-CM | POA: Diagnosis not present

## 2020-08-17 DIAGNOSIS — Z992 Dependence on renal dialysis: Secondary | ICD-10-CM | POA: Diagnosis not present

## 2020-08-17 DIAGNOSIS — N186 End stage renal disease: Secondary | ICD-10-CM | POA: Diagnosis not present

## 2020-08-19 DIAGNOSIS — N186 End stage renal disease: Secondary | ICD-10-CM | POA: Diagnosis not present

## 2020-08-19 DIAGNOSIS — Z992 Dependence on renal dialysis: Secondary | ICD-10-CM | POA: Diagnosis not present

## 2020-08-20 DIAGNOSIS — N186 End stage renal disease: Secondary | ICD-10-CM | POA: Diagnosis not present

## 2020-08-20 DIAGNOSIS — Z992 Dependence on renal dialysis: Secondary | ICD-10-CM | POA: Diagnosis not present

## 2020-08-21 ENCOUNTER — Encounter
Admission: RE | Admit: 2020-08-21 | Discharge: 2020-08-21 | Disposition: A | Discharge status: Home / Self Care | Payer: Medicare HMO | Source: Ambulatory Visit | Attending: Family | Admitting: Family

## 2020-08-21 ENCOUNTER — Other Ambulatory Visit: Payer: Self-pay

## 2020-08-21 DIAGNOSIS — N186 End stage renal disease: Secondary | ICD-10-CM | POA: Diagnosis not present

## 2020-08-21 DIAGNOSIS — Z992 Dependence on renal dialysis: Secondary | ICD-10-CM | POA: Diagnosis not present

## 2020-08-21 DIAGNOSIS — I25118 Atherosclerotic heart disease of native coronary artery with other forms of angina pectoris: Secondary | ICD-10-CM | POA: Diagnosis not present

## 2020-08-21 LAB — NM MYOCAR MULTI W/SPECT W/WALL MOTION / EF
Estimated workload: 1 METS
Exercise duration (min): 0 min
Exercise duration (sec): 0 s
LV dias vol: 136 mL (ref 62–150)
LV sys vol: 58 mL
MPHR: 156 {beats}/min
Peak HR: 97 {beats}/min
Percent HR: 62 %
Rest HR: 73 {beats}/min
SDS: 0
SRS: 8
SSS: 1
TID: 0.94

## 2020-08-21 MED ORDER — TECHNETIUM TC 99M TETROFOSMIN IV KIT
30.3250 | PACK | Freq: Once | INTRAVENOUS | Status: AC | PRN
  Administered 2020-08-21: 30.325 via INTRAVENOUS

## 2020-08-21 MED ORDER — REGADENOSON 0.4 MG/5ML IV SOLN
0.4000 mg | Freq: Once | INTRAVENOUS | Status: AC
  Administered 2020-08-21: 0.4 mg via INTRAVENOUS

## 2020-08-21 MED ORDER — TECHNETIUM TC 99M TETROFOSMIN IV KIT
10.3010 | PACK | Freq: Once | INTRAVENOUS | Status: AC | PRN
  Administered 2020-08-21: 10.301 via INTRAVENOUS

## 2020-08-22 ENCOUNTER — Telehealth: Payer: Self-pay

## 2020-08-22 DIAGNOSIS — Z992 Dependence on renal dialysis: Secondary | ICD-10-CM | POA: Diagnosis not present

## 2020-08-22 DIAGNOSIS — N186 End stage renal disease: Secondary | ICD-10-CM | POA: Diagnosis not present

## 2020-08-22 NOTE — Telephone Encounter (Signed)
Able to reach pt's wife Neoma Laming (approved DPR) regarding pt's recent stress test, provider Caitlyn, NP had a chance to review his results and advised  "Stress test was low risk with no evidence of blockage. Heart pumping function was low normal at 51% (normal 50-65%) though upcoming echocardiogram will allow for a more accurate reading of heart pumping function. Good result! Continue to follow up with office visit and echocardiogram as scheduled.". Wife excited with results and reports pt still schedule for his upcoming echo on 12/17.  All questions or concerns were address and no additional concerns at this time. Agreeable to plan, will call back for anything further.

## 2020-08-22 NOTE — Telephone Encounter (Signed)
-----   Message from Loel Dubonnet, NP sent at 08/21/2020  2:07 PM EST ----- Stress test was low risk with no evidence of blockage. Heart pumping function was low normal at 51% (normal 50-65%) though upcoming echocardiogram will allow for a more accurate reading of heart pumping function. Good result! Continue to follow up with office visit and echocardiogram as scheduled.

## 2020-08-23 DIAGNOSIS — Z992 Dependence on renal dialysis: Secondary | ICD-10-CM | POA: Diagnosis not present

## 2020-08-23 DIAGNOSIS — N186 End stage renal disease: Secondary | ICD-10-CM | POA: Diagnosis not present

## 2020-08-24 DIAGNOSIS — Z992 Dependence on renal dialysis: Secondary | ICD-10-CM | POA: Diagnosis not present

## 2020-08-24 DIAGNOSIS — N186 End stage renal disease: Secondary | ICD-10-CM | POA: Diagnosis not present

## 2020-08-26 DIAGNOSIS — Z992 Dependence on renal dialysis: Secondary | ICD-10-CM | POA: Diagnosis not present

## 2020-08-26 DIAGNOSIS — N186 End stage renal disease: Secondary | ICD-10-CM | POA: Diagnosis not present

## 2020-08-27 DIAGNOSIS — Z992 Dependence on renal dialysis: Secondary | ICD-10-CM | POA: Diagnosis not present

## 2020-08-27 DIAGNOSIS — N186 End stage renal disease: Secondary | ICD-10-CM | POA: Diagnosis not present

## 2020-08-28 ENCOUNTER — Other Ambulatory Visit: Payer: Self-pay | Admitting: Family

## 2020-08-28 DIAGNOSIS — Z992 Dependence on renal dialysis: Secondary | ICD-10-CM | POA: Diagnosis not present

## 2020-08-28 DIAGNOSIS — R0600 Dyspnea, unspecified: Secondary | ICD-10-CM

## 2020-08-28 DIAGNOSIS — N186 End stage renal disease: Secondary | ICD-10-CM | POA: Diagnosis not present

## 2020-08-29 DIAGNOSIS — Z992 Dependence on renal dialysis: Secondary | ICD-10-CM | POA: Diagnosis not present

## 2020-08-29 DIAGNOSIS — N186 End stage renal disease: Secondary | ICD-10-CM | POA: Diagnosis not present

## 2020-08-30 DIAGNOSIS — G8929 Other chronic pain: Secondary | ICD-10-CM | POA: Diagnosis not present

## 2020-08-30 DIAGNOSIS — M25612 Stiffness of left shoulder, not elsewhere classified: Secondary | ICD-10-CM | POA: Diagnosis not present

## 2020-08-30 DIAGNOSIS — M25512 Pain in left shoulder: Secondary | ICD-10-CM | POA: Diagnosis not present

## 2020-08-30 DIAGNOSIS — M6281 Muscle weakness (generalized): Secondary | ICD-10-CM | POA: Diagnosis not present

## 2020-08-30 DIAGNOSIS — Z992 Dependence on renal dialysis: Secondary | ICD-10-CM | POA: Diagnosis not present

## 2020-08-30 DIAGNOSIS — M12812 Other specific arthropathies, not elsewhere classified, left shoulder: Secondary | ICD-10-CM | POA: Diagnosis not present

## 2020-08-30 DIAGNOSIS — N186 End stage renal disease: Secondary | ICD-10-CM | POA: Diagnosis not present

## 2020-08-31 DIAGNOSIS — Z992 Dependence on renal dialysis: Secondary | ICD-10-CM | POA: Diagnosis not present

## 2020-08-31 DIAGNOSIS — N186 End stage renal disease: Secondary | ICD-10-CM | POA: Diagnosis not present

## 2020-09-02 DIAGNOSIS — Z992 Dependence on renal dialysis: Secondary | ICD-10-CM | POA: Diagnosis not present

## 2020-09-02 DIAGNOSIS — N186 End stage renal disease: Secondary | ICD-10-CM | POA: Diagnosis not present

## 2020-09-03 DIAGNOSIS — N186 End stage renal disease: Secondary | ICD-10-CM | POA: Diagnosis not present

## 2020-09-03 DIAGNOSIS — G8929 Other chronic pain: Secondary | ICD-10-CM | POA: Diagnosis not present

## 2020-09-03 DIAGNOSIS — M12812 Other specific arthropathies, not elsewhere classified, left shoulder: Secondary | ICD-10-CM | POA: Diagnosis not present

## 2020-09-03 DIAGNOSIS — M25512 Pain in left shoulder: Secondary | ICD-10-CM | POA: Diagnosis not present

## 2020-09-03 DIAGNOSIS — M6281 Muscle weakness (generalized): Secondary | ICD-10-CM | POA: Diagnosis not present

## 2020-09-03 DIAGNOSIS — Z992 Dependence on renal dialysis: Secondary | ICD-10-CM | POA: Diagnosis not present

## 2020-09-03 DIAGNOSIS — M25612 Stiffness of left shoulder, not elsewhere classified: Secondary | ICD-10-CM | POA: Diagnosis not present

## 2020-09-04 DIAGNOSIS — Z992 Dependence on renal dialysis: Secondary | ICD-10-CM | POA: Diagnosis not present

## 2020-09-04 DIAGNOSIS — N186 End stage renal disease: Secondary | ICD-10-CM | POA: Diagnosis not present

## 2020-09-05 DIAGNOSIS — N186 End stage renal disease: Secondary | ICD-10-CM | POA: Diagnosis not present

## 2020-09-05 DIAGNOSIS — Z992 Dependence on renal dialysis: Secondary | ICD-10-CM | POA: Diagnosis not present

## 2020-09-06 DIAGNOSIS — Z992 Dependence on renal dialysis: Secondary | ICD-10-CM | POA: Diagnosis not present

## 2020-09-06 DIAGNOSIS — N186 End stage renal disease: Secondary | ICD-10-CM | POA: Diagnosis not present

## 2020-09-07 ENCOUNTER — Ambulatory Visit (INDEPENDENT_AMBULATORY_CARE_PROVIDER_SITE_OTHER): Payer: Medicare HMO

## 2020-09-07 ENCOUNTER — Other Ambulatory Visit: Payer: Self-pay

## 2020-09-07 DIAGNOSIS — N186 End stage renal disease: Secondary | ICD-10-CM | POA: Diagnosis not present

## 2020-09-07 DIAGNOSIS — Z992 Dependence on renal dialysis: Secondary | ICD-10-CM | POA: Diagnosis not present

## 2020-09-07 DIAGNOSIS — R0600 Dyspnea, unspecified: Secondary | ICD-10-CM

## 2020-09-07 LAB — ECHOCARDIOGRAM COMPLETE
AR max vel: 1.8 cm2
AV Area VTI: 2.06 cm2
AV Area mean vel: 2.17 cm2
AV Mean grad: 10 mmHg
AV Peak grad: 20.6 mmHg
Ao pk vel: 2.27 m/s
Area-P 1/2: 3.15 cm2
Calc EF: 55.2 %
P 1/2 time: 480 msec
S' Lateral: 3.7 cm
Single Plane A2C EF: 53.7 %
Single Plane A4C EF: 55.3 %

## 2020-09-09 DIAGNOSIS — N186 End stage renal disease: Secondary | ICD-10-CM | POA: Diagnosis not present

## 2020-09-09 DIAGNOSIS — Z992 Dependence on renal dialysis: Secondary | ICD-10-CM | POA: Diagnosis not present

## 2020-09-10 ENCOUNTER — Other Ambulatory Visit: Payer: Self-pay

## 2020-09-10 ENCOUNTER — Encounter: Payer: Self-pay | Admitting: Family

## 2020-09-10 ENCOUNTER — Ambulatory Visit (INDEPENDENT_AMBULATORY_CARE_PROVIDER_SITE_OTHER): Payer: Medicare HMO | Admitting: Family

## 2020-09-10 VITALS — BP 152/70 | HR 72 | Ht 67.0 in | Wt 170.0 lb

## 2020-09-10 DIAGNOSIS — Z720 Tobacco use: Secondary | ICD-10-CM

## 2020-09-10 DIAGNOSIS — Z992 Dependence on renal dialysis: Secondary | ICD-10-CM | POA: Diagnosis not present

## 2020-09-10 DIAGNOSIS — I25118 Atherosclerotic heart disease of native coronary artery with other forms of angina pectoris: Secondary | ICD-10-CM

## 2020-09-10 DIAGNOSIS — R0600 Dyspnea, unspecified: Secondary | ICD-10-CM

## 2020-09-10 DIAGNOSIS — I10 Essential (primary) hypertension: Secondary | ICD-10-CM | POA: Diagnosis not present

## 2020-09-10 DIAGNOSIS — E782 Mixed hyperlipidemia: Secondary | ICD-10-CM

## 2020-09-10 DIAGNOSIS — G8929 Other chronic pain: Secondary | ICD-10-CM | POA: Diagnosis not present

## 2020-09-10 DIAGNOSIS — M6281 Muscle weakness (generalized): Secondary | ICD-10-CM | POA: Diagnosis not present

## 2020-09-10 DIAGNOSIS — R0609 Other forms of dyspnea: Secondary | ICD-10-CM

## 2020-09-10 DIAGNOSIS — J432 Centrilobular emphysema: Secondary | ICD-10-CM | POA: Diagnosis not present

## 2020-09-10 DIAGNOSIS — M25612 Stiffness of left shoulder, not elsewhere classified: Secondary | ICD-10-CM | POA: Diagnosis not present

## 2020-09-10 DIAGNOSIS — M12812 Other specific arthropathies, not elsewhere classified, left shoulder: Secondary | ICD-10-CM | POA: Diagnosis not present

## 2020-09-10 DIAGNOSIS — N186 End stage renal disease: Secondary | ICD-10-CM | POA: Diagnosis not present

## 2020-09-10 DIAGNOSIS — M25512 Pain in left shoulder: Secondary | ICD-10-CM | POA: Diagnosis not present

## 2020-09-10 MED ORDER — CARVEDILOL 12.5 MG PO TABS: 18.7500 mg | ORAL_TABLET | Freq: Two times a day (BID) | ORAL | 3 refills | Status: DC

## 2020-09-10 NOTE — Patient Instructions (Addendum)
Medication Instructions:  Your physician has recommended you make the following change in your medication:   CHANGE Carvedilol (Coreg) to 18.75mg  (1.5 tablets) twice daily  *If you need a refill on your cardiac medications before your next appointment, please call your pharmacy*   Lab Work: None ordered today.   Testing/Procedures: Your stress test showed no significant blockages.   Your echocardiogram showed normal heart pumping function.    Follow-Up: At [facility], you and your health needs are our priority.  As part of our continuing mission to provide you with exceptional heart care, we have created designated Provider Care Teams.  These Care Teams include your primary Cardiologist (physician) and Advanced Practice Providers (APPs -  Physician Assistants and Nurse Practitioners) who all work together to provide you with the care you need, when you need it.  We recommend signing up for the patient portal called "MyChart".  Sign up information is provided on this After Visit Summary.  MyChart is used to connect with patients for Virtual Visits (Telemedicine).  Patients are able to view lab/test results, encounter notes, upcoming appointments, etc.  Non-urgent messages can be sent to your provider as well.   To learn more about what you can do with MyChart, go to [email or web address].    Your next appointment:   6 month(s)  The format for your next appointment:   In Person  Provider:   You may see Nelva Bush, MD or one of the following Advanced Practice Providers on your designated Care Team:    Murray Hodgkins, NP  Christell Faith, PA-C  Marrianne Mood, PA-C  Cadence [place], [place]  Laurann Montana, NP  Other Instructions  Our goal is for your blood pressure to be consistently less than 130/80.  Talk to your primary care provider about starting an inhaler for COPD. If you need a referral to pulmonology, simply call and let us know.    Pursed Lip  Breathing Pursed lip breathing is a technique to relieve the feeling of being short of breath. Some long-term respiratory conditions, like chronic obstructive pulmonary disease (COPD) and severe asthma, can make it hard to breathe out (exhale) all of the air in your lungs. This can make air that has less oxygen than normal build up in your lungs (air trapping). Trapped air means your lungs fill with less fresh air when you breathe in (inhale). As a result, you feel short of breath. Pursed lip breathing keeps your airways open longer when you exhale and empties more air from your lungs. This makes more space for fresh air when you inhale. Pursed lip breathing can also slow down your breathing and help your body not have to work so hard to breathe. Over time, pursed lip breathing may help you be able to be more physically active and do more activities. How to perform pursed lip breathing Being short of breath can make you tense and anxious. Before you start this breathing exercise, take a minute to relax your shoulders and close your eyes. Then: 1. Start the exercise by closing your mouth. 2. Breathe in through your nose, taking a normal breath. You can do this at your normal rate of breathing. If you feel you are not getting enough air, breathe in while slowly counting to 2 or 3. 3. Pucker (purse) your lips as if you were going to whistle. 4. Gently tighten your abdomen muscles or press on your belly to help push the air out. 5. Breathe out slowly through your pursed lips.  Take at least twice as long to breathe out as it takes you to breathe in. 6. Make sure that you breathe out all of the air, but do not force air out. 7. Repeat the exercise until your breathing improves. Ask your health care provider how often and how long to do this exercise. Follow these instructions at home:  Take over-the-counter and prescription medicines only as told by your health care provider.  Return to your normal  activities as told by your health care provider. Ask your health care provider what activities are safe for you.  Do not use any products that contain nicotine or tobacco, such as cigarettes and e-cigarettes. If you need help quitting, ask your health care provider.  Keep all follow-up visits as told by your health care provider. This is important. Contact a health care provider if:  Your shortness of breath gets worse.  You become less able to exercise or be active.  You develop a cough.  You develop a fever. Get help right away if:  You are struggling to breathe.  Your shortness of breath prevents you from engaging in any activity. Summary  Pursed lip breathing is a breathing technique that helps to remove trapped air from your lungs. It helps you get more oxygen into your lungs and makes your body have to work less hard to breathe.  Pursed lip breathing can gradually make you more able to be physically active.  You can do pursed lip breathing on your own at home.  Ask your health care provider how often and how long you should do pursed lip breathing. This information is not intended to replace advice given to you by your health care provider. Make sure you discuss any questions you have with your health care provider. Document Revised: 08/21/2017 Document Reviewed: 07/31/2016 Elsevier Patient Education  2020 [employer].

## 2020-09-10 NOTE — Progress Notes (Signed)
Office Visit    Patient Name: James Arnold Date of Encounter: 09/10/2020  Primary Care Provider:  Valerie Roys, DO Primary Cardiologist:  Nelva Bush, MD Electrophysiologist:  None   Chief Complaint    James Arnold is a 64 y.o. 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   Past Medical History    Past Medical History:  Diagnosis Date  . Acute renal failure (ARF) ([place]) 05/15/2017  . Anemia   . Benign hypertensive renal disease   . CAD (coronary artery disease)    a. 08/1996 s/p BMS to the mLAD ([facility]); b. 12/2017 MV: EF 46%, fixed apical defect w/ significant GI uptake artifact. No ischemia-> low risk.  . CKD (chronic kidney disease), stage IV ([place])   . Depression   . Diastolic dysfunction    a. 12/2017 Echo: EF 60-65%, no rwma, Gr1 DD, mild AI/MR. Nl RVSP.  [name] Kitchen Dyspnea    with exertion  . FSGS (focal segmental glomerulosclerosis)   . Hyperlipidemia 10/2002  . Hypertension   . Myelofibrosis ([place]) 2010  . Smoker   . Thrombocytopenia ([place])    Past Surgical History:  Procedure Laterality Date  . ANGIOPLASTY    . BONE MARROW ASPIRATION  03/2009  . CARDIAC CATHETERIZATION    . COLONOSCOPY WITH PROPOFOL N/A 08/09/2018   Procedure: COLONOSCOPY WITH PROPOFOL;  Surgeon: [name], MD;  Location: [facility] ENDOSCOPY;  Service: Gastroenterology;  Laterality: N/A;  . CORONARY ARTERY BYPASS GRAFT    . CORONARY STENT PLACEMENT  08/1996    Allergies  No Known Allergies  History of Present Illness    James Arnold is a 64 y.o. 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  He was last seen 08/15/2020.  Cardiac history notable for CAD s/p bare-metal stenting of mid LAD in 1997 at [facility].  He was hospitalized in 2019 with chest pain  underwent echocardiogram which showed normal LV function and grade 1 diastolic dysfunction.  Stress test was low risk/nonischemic.  He was seen in follow-up May 2019 with no recurrent anginal symptoms.  His carvedilol was increased to 12.5 mg twice daily due to elevated blood pressure.  He was then lost to follow up until 08/15/20.   He was in the emergency department at [facility] 07/26/2020 after presenting with chest pain.  He notes 3-day history of exertional chest heaviness associate with shortness of breath and nonproductive cough.  Chest x-ray with out infiltrate.  Per ED MD his presentation was consistent with COPD exacerbation. HS troponin of 36 and 32. His symptoms improved with breathing treatments and steroids.  He does home peritoneal dialysis every day but Saturday.  Seen in clinic 08/15/20 noting dyspnea on exertion and chest "just pounding". He was started on Imdur 30mg  daily. Stress test and echocardiogram were recommended.   Lexiscan Myoview 08/21/20 was low risk with no evidence of ischemia. Echo 09/07/20 LVEF 60-65%, gr1DD, RV normal size and pressure, mild to moderate MR.  Presents today for follow-up.  We reviewed his echocardiogram and Lexiscan Myoview in detail.  He was reassured by the results.  Reports no recurrent chest pain, pressure, tightness.  Endorses dyspnea on exertion with more than usual activity.  We discussed that this is likely related to his lung function.  He had CT scan in April of this year for screening for lung nodules which noted emphysema, he was unaware of this diagnosis.    EKGs/Labs/Other Studies Reviewed:   The following studies were reviewed today: Echo 09/07/20  1. Left ventricular ejection fraction, by estimation, is 60 to 65%. The  left ventricle has normal function. The left ventricle has no regional  wall motion abnormalities. Left ventricular diastolic parameters are  consistent with Grade I diastolic  dysfunction (impaired relaxation). The  average left ventricular global  longitudinal strain is -15.8 %.   2. Right ventricular systolic function is normal. The right ventricular  size is normal. There is normal pulmonary artery systolic pressure. The  estimated right ventricular systolic pressure is 29.4 mmHg.   3. Mild to moderate mitral valve regurgitation.   Echo 12/2017 Left ventricle: The cavity size was normal. There was mild    concentric hypertrophy. Systolic function was normal. The    estimated ejection fraction was in the range of 60% to 65%. Wall    motion was normal; there were no regional wall motion    abnormalities. Doppler parameters are consistent with abnormal    left ventricular relaxation (grade 1 diastolic dysfunction).  - Aortic valve: There was mild regurgitation.  - Mitral valve: There was mild regurgitation.  - Left atrium: The atrium was normal in size.  - Right ventricle: Systolic function was normal.  - Pulmonary arteries: Systolic pressure was within the normal    range.   EKG:  EKG is ordered today.  The ekg ordered today demonstrates NSR 72 bpm with minimal voltage criteria for LVH.  No acute ST/T wave changes.  Recent Labs: 07/10/2020: TSH 1.900 08/10/2020: ALT 12; BUN 58; Creatinine, Ser 5.38; Hemoglobin 11.3; Platelets 106; Potassium 5.2; Sodium 138  Recent Lipid Panel    Component Value Date/Time   CHOL 205 (H) 07/10/2020 0909   TRIG 179 (H) 07/10/2020 0909   HDL 33 (L) 07/10/2020 0909   CHOLHDL 7.7 12/30/2017 0435   VLDL 50 (H) 12/30/2017 0435   LDLCALC 140 (H) 07/10/2020 0909   Home Medications   Current Meds  Medication Sig  . acetaminophen (TYLENOL) 500 MG tablet Take 1,000 mg by mouth every 6 (six) hours as needed.   [name] Kitchen amLODipine (NORVASC) 10 MG tablet Take 1 tablet (10 mg total) by mouth daily.  [name] Kitchen aspirin EC 81 MG tablet Take by mouth.  . Cholecalciferol (VITAMIN D-1000 MAX ST) 25 MCG (1000 UT) tablet Take 1,000 Units by mouth daily.   . citalopram (CELEXA) 40 MG tablet  TAKE 1 TABLET EVERY DAY  . epoetin alfa-epbx (RETACRIT) 76546 UNIT/ML injection Inject into the skin.  . ferrous sulfate 325 (65 FE) MG tablet Take 1 tablet (325 mg total) by mouth daily with breakfast.  . furosemide (LASIX) 20 MG tablet Take 1 tablet (20 mg total) by mouth daily.  [name] Kitchen gentamicin cream (GARAMYCIN) 0.1 % APP  CATHETER EXIT SITE D  . losartan (COZAAR) 50 MG tablet Take 1 tablet (50 mg total) by mouth daily.  . predniSONE (DELTASONE) 10 MG tablet Take 10 mg by mouth daily with breakfast.  . [DISCONTINUED] carvedilol (COREG) 12.5 MG tablet Take 1 tablet (12.5 mg total) by mouth 2 (two) times daily.    Review of Systems    Review of Systems  Constitutional: Negative for chills, fever and malaise/fatigue.  Cardiovascular: Positive for dyspnea on exertion. Negative for chest pain, leg swelling, near-syncope, orthopnea, palpitations and syncope.  Respiratory: Positive for  shortness of breath. Negative for cough and wheezing.   Gastrointestinal: Negative for nausea and vomiting.  Neurological: Negative for dizziness, light-headedness and weakness.   All other systems reviewed and are otherwise negative except as noted above.  Physical Exam    VS:  BP (!) 152/70 (BP Location: Left Arm, Patient Position: Sitting, Cuff Size: Normal)   Pulse 72   Ht 5\' 7"  (1.702 m)   Wt 170 lb (77.1 kg)   SpO2 98%   BMI 26.63 kg/m  , BMI Body mass index is 26.63 kg/m.  Wt Readings from Last 3 Encounters:  09/10/20 170 lb (77.1 kg)  08/15/20 174 lb 6.4 oz (79.1 kg)  08/10/20 174 lb 9.6 oz (79.2 kg)    GEN: Well nourished, overweight, well developed, in no aute distress. HEENT: normal. Neck: Supple, no JVD, carotid bruits, or masses. Cardiac: RRR, no murmurs, rubs, or gallops. No clubbing, cyanosis, edema.  Radials/DP/PT 2+ and equal bilaterally.  Respiratory:  Respirations regular and unlabored.  GI: Soft, nontender, nondistended. MS: No deformity or atrophy. Skin: Warm and dry, no  rash. Neuro:  Strength and sensation are intact. Psych: Normal affect.  Assessment & Plan    1. DOE - Carlton Adam 07/2020 low risk and echo 08/2020 with normal LVEF and mild to moderate MR. Consider etiology likely COPD due to longstanding tobacco use.  CT scan 01/10/2020 with centrilobular emphysema and smoking-related respiratory bronchiolitis.  He was unaware of diagnosis of COPD.  We discussed follow-up with his primary care provider versus referral to pulmonary and at this point he prefers to follow-up with his primary care provider.  Encouraged to discuss maintenance inhaler therapy.  2. CAD- Lexiscan myoview 07/2020 low risk study.  DOE, as above.  No recurrent chest pain, pressure, tightness.  GDMT includes aspirin, beta-blocker.  Of note, he was intolerant of Imdur with headache.  Declines statin, as below.  3. Tobacco use- Not interested in quitting at this time. Smoking cessation encouraged. Recommend utilization of 1800QUITNOW.  4. HTN-BP mildly elevated today.  Reports BP often elevated at home.  Continue amlodipine 10 mg daily, losartan 50 mg daily.  Increase carvedilol to 18.75 mg twice daily.  Future considerations include transition from losartan to hydralazine as I am sure what benefit losartan is having in the setting of his renal function and peritoneal dialysis.  5. HLD, LDL goal less than 70-Declines lipid-lowering therapy including statins as he shares with me his wife had complications after taking cholesterol medication.  Lab work 07/10/2020 with LDL 140.  Declines cholesterol-lowering agents today.    6. CKD IV/V - Continue to follow with nephrology.  On home peritoneal dialysis.  Of note, he is presently on Lasix 20 mg daily and in the setting of peritoneal dialysis low suspicion this is of significant benefit.  Disposition: Follow up in 6 month(s) with Dr. Saunders Revel or APP  Signed, Loel Dubonnet, NP 09/10/2020, 9:52 AM [place]

## 2020-09-11 DIAGNOSIS — N186 End stage renal disease: Secondary | ICD-10-CM | POA: Diagnosis not present

## 2020-09-11 DIAGNOSIS — Z992 Dependence on renal dialysis: Secondary | ICD-10-CM | POA: Diagnosis not present

## 2020-09-12 DIAGNOSIS — Z992 Dependence on renal dialysis: Secondary | ICD-10-CM | POA: Diagnosis not present

## 2020-09-12 DIAGNOSIS — N186 End stage renal disease: Secondary | ICD-10-CM | POA: Diagnosis not present

## 2020-09-13 DIAGNOSIS — Z992 Dependence on renal dialysis: Secondary | ICD-10-CM | POA: Diagnosis not present

## 2020-09-13 DIAGNOSIS — N186 End stage renal disease: Secondary | ICD-10-CM | POA: Diagnosis not present

## 2020-09-14 DIAGNOSIS — Z992 Dependence on renal dialysis: Secondary | ICD-10-CM | POA: Diagnosis not present

## 2020-09-14 DIAGNOSIS — N186 End stage renal disease: Secondary | ICD-10-CM | POA: Diagnosis not present

## 2020-09-16 DIAGNOSIS — Z992 Dependence on renal dialysis: Secondary | ICD-10-CM | POA: Diagnosis not present

## 2020-09-16 DIAGNOSIS — N186 End stage renal disease: Secondary | ICD-10-CM | POA: Diagnosis not present

## 2020-09-17 DIAGNOSIS — N186 End stage renal disease: Secondary | ICD-10-CM | POA: Diagnosis not present

## 2020-09-17 DIAGNOSIS — Z992 Dependence on renal dialysis: Secondary | ICD-10-CM | POA: Diagnosis not present

## 2020-09-18 DIAGNOSIS — N186 End stage renal disease: Secondary | ICD-10-CM | POA: Diagnosis not present

## 2020-09-18 DIAGNOSIS — Z992 Dependence on renal dialysis: Secondary | ICD-10-CM | POA: Diagnosis not present

## 2020-09-19 DIAGNOSIS — Z992 Dependence on renal dialysis: Secondary | ICD-10-CM | POA: Diagnosis not present

## 2020-09-19 DIAGNOSIS — N186 End stage renal disease: Secondary | ICD-10-CM | POA: Diagnosis not present

## 2020-09-20 DIAGNOSIS — N186 End stage renal disease: Secondary | ICD-10-CM | POA: Diagnosis not present

## 2020-09-20 DIAGNOSIS — Z992 Dependence on renal dialysis: Secondary | ICD-10-CM | POA: Diagnosis not present

## 2020-09-21 DIAGNOSIS — Z992 Dependence on renal dialysis: Secondary | ICD-10-CM | POA: Diagnosis not present

## 2020-09-21 DIAGNOSIS — N186 End stage renal disease: Secondary | ICD-10-CM | POA: Diagnosis not present

## 2020-09-23 DIAGNOSIS — Z992 Dependence on renal dialysis: Secondary | ICD-10-CM | POA: Diagnosis not present

## 2020-09-23 DIAGNOSIS — N186 End stage renal disease: Secondary | ICD-10-CM | POA: Diagnosis not present

## 2020-09-24 DIAGNOSIS — N186 End stage renal disease: Secondary | ICD-10-CM | POA: Diagnosis not present

## 2020-09-24 DIAGNOSIS — Z992 Dependence on renal dialysis: Secondary | ICD-10-CM | POA: Diagnosis not present

## 2020-09-25 DIAGNOSIS — N186 End stage renal disease: Secondary | ICD-10-CM | POA: Diagnosis not present

## 2020-09-25 DIAGNOSIS — Z992 Dependence on renal dialysis: Secondary | ICD-10-CM | POA: Diagnosis not present

## 2020-09-26 DIAGNOSIS — Z992 Dependence on renal dialysis: Secondary | ICD-10-CM | POA: Diagnosis not present

## 2020-09-26 DIAGNOSIS — N186 End stage renal disease: Secondary | ICD-10-CM | POA: Diagnosis not present

## 2020-09-27 DIAGNOSIS — N186 End stage renal disease: Secondary | ICD-10-CM | POA: Diagnosis not present

## 2020-09-27 DIAGNOSIS — Z992 Dependence on renal dialysis: Secondary | ICD-10-CM | POA: Diagnosis not present

## 2020-09-28 DIAGNOSIS — Z992 Dependence on renal dialysis: Secondary | ICD-10-CM | POA: Diagnosis not present

## 2020-09-28 DIAGNOSIS — N186 End stage renal disease: Secondary | ICD-10-CM | POA: Diagnosis not present

## 2020-09-30 DIAGNOSIS — N186 End stage renal disease: Secondary | ICD-10-CM | POA: Diagnosis not present

## 2020-09-30 DIAGNOSIS — Z992 Dependence on renal dialysis: Secondary | ICD-10-CM | POA: Diagnosis not present

## 2020-10-01 DIAGNOSIS — Z992 Dependence on renal dialysis: Secondary | ICD-10-CM | POA: Diagnosis not present

## 2020-10-01 DIAGNOSIS — N186 End stage renal disease: Secondary | ICD-10-CM | POA: Diagnosis not present

## 2020-10-02 DIAGNOSIS — N186 End stage renal disease: Secondary | ICD-10-CM | POA: Diagnosis not present

## 2020-10-02 DIAGNOSIS — Z992 Dependence on renal dialysis: Secondary | ICD-10-CM | POA: Diagnosis not present

## 2020-10-03 DIAGNOSIS — N186 End stage renal disease: Secondary | ICD-10-CM | POA: Diagnosis not present

## 2020-10-03 DIAGNOSIS — Z992 Dependence on renal dialysis: Secondary | ICD-10-CM | POA: Diagnosis not present

## 2020-10-04 DIAGNOSIS — Z992 Dependence on renal dialysis: Secondary | ICD-10-CM | POA: Diagnosis not present

## 2020-10-04 DIAGNOSIS — N186 End stage renal disease: Secondary | ICD-10-CM | POA: Diagnosis not present

## 2020-10-05 DIAGNOSIS — Z992 Dependence on renal dialysis: Secondary | ICD-10-CM | POA: Diagnosis not present

## 2020-10-05 DIAGNOSIS — N186 End stage renal disease: Secondary | ICD-10-CM | POA: Diagnosis not present

## 2020-10-07 DIAGNOSIS — N186 End stage renal disease: Secondary | ICD-10-CM | POA: Diagnosis not present

## 2020-10-07 DIAGNOSIS — Z992 Dependence on renal dialysis: Secondary | ICD-10-CM | POA: Diagnosis not present

## 2020-10-08 DIAGNOSIS — N186 End stage renal disease: Secondary | ICD-10-CM | POA: Diagnosis not present

## 2020-10-08 DIAGNOSIS — Z992 Dependence on renal dialysis: Secondary | ICD-10-CM | POA: Diagnosis not present

## 2020-10-09 DIAGNOSIS — Z992 Dependence on renal dialysis: Secondary | ICD-10-CM | POA: Diagnosis not present

## 2020-10-09 DIAGNOSIS — N186 End stage renal disease: Secondary | ICD-10-CM | POA: Diagnosis not present

## 2020-10-10 DIAGNOSIS — N186 End stage renal disease: Secondary | ICD-10-CM | POA: Diagnosis not present

## 2020-10-10 DIAGNOSIS — Z992 Dependence on renal dialysis: Secondary | ICD-10-CM | POA: Diagnosis not present

## 2020-10-11 ENCOUNTER — Other Ambulatory Visit: Payer: Self-pay | Admitting: Orthopedic Surgery

## 2020-10-11 DIAGNOSIS — M12812 Other specific arthropathies, not elsewhere classified, left shoulder: Secondary | ICD-10-CM | POA: Diagnosis not present

## 2020-10-11 DIAGNOSIS — N186 End stage renal disease: Secondary | ICD-10-CM | POA: Diagnosis not present

## 2020-10-11 DIAGNOSIS — Z992 Dependence on renal dialysis: Secondary | ICD-10-CM | POA: Diagnosis not present

## 2020-10-12 DIAGNOSIS — N186 End stage renal disease: Secondary | ICD-10-CM | POA: Diagnosis not present

## 2020-10-12 DIAGNOSIS — Z992 Dependence on renal dialysis: Secondary | ICD-10-CM | POA: Diagnosis not present

## 2020-10-14 DIAGNOSIS — N186 End stage renal disease: Secondary | ICD-10-CM | POA: Diagnosis not present

## 2020-10-14 DIAGNOSIS — Z992 Dependence on renal dialysis: Secondary | ICD-10-CM | POA: Diagnosis not present

## 2020-10-15 ENCOUNTER — Telehealth: Payer: Self-pay | Admitting: Internal Medicine

## 2020-10-15 DIAGNOSIS — Z992 Dependence on renal dialysis: Secondary | ICD-10-CM | POA: Diagnosis not present

## 2020-10-15 DIAGNOSIS — N186 End stage renal disease: Secondary | ICD-10-CM | POA: Diagnosis not present

## 2020-10-15 NOTE — Telephone Encounter (Signed)
   Primary Cardiologist: Nelva Bush, MD  Chart reviewed as part of pre-operative protocol coverage. Given past medical history and time since last visit, based on ACC/AHA guidelines, James Arnold would be at acceptable risk for the planned procedure without further cardiovascular testing.   I will route this recommendation to the requesting party via Epic fax function and remove from pre-op pool.  Please call with questions.  Jossie Ng. Terrance Lanahan NP-C    10/15/2020, 3:19 PM [place] [address] Office [phone] Fax [phone]

## 2020-10-15 NOTE — Telephone Encounter (Signed)
   [facility] Pre-operative Risk Assessment    HEARTCARE STAFF: - Please ensure there is not already an duplicate clearance open for this procedure. - Under Visit Info/Reason for Call, type in Other and utilize the format Clearance MM/DD/YY or Clearance TBD. Do not use dashes or single digits. - If request is for dental extraction, please clarify the # of teeth to be extracted.  Request for surgical clearance:  1. What type of surgery is being performed? L reverse shoulder arthoplasty & biceps tenodesis   2. When is this surgery scheduled? 11/12/20  3. What type of clearance is required (medical clearance vs. Pharmacy clearance to hold med vs. Both)? Not noted  4. Are there any medications that need to be held prior to surgery and how long? Advise on any blood thinners  5. Practice name and name of physician performing surgery? [facility] medicine Leim Fabry  6. What is the office phone number? [phone]   7.   What is the office fax number? [phone]  8.   Anesthesia type (None, local, MAC, general) ? Not noted   Marykay Lex 10/15/2020, 3:07 PM  _________________________________________________________________   (provider comments below)

## 2020-10-16 ENCOUNTER — Telehealth: Payer: Self-pay

## 2020-10-16 ENCOUNTER — Telehealth: Payer: Self-pay | Admitting: *Deleted

## 2020-10-16 DIAGNOSIS — N186 End stage renal disease: Secondary | ICD-10-CM | POA: Diagnosis not present

## 2020-10-16 DIAGNOSIS — D471 Chronic myeloproliferative disease: Secondary | ICD-10-CM

## 2020-10-16 DIAGNOSIS — Z992 Dependence on renal dialysis: Secondary | ICD-10-CM | POA: Diagnosis not present

## 2020-10-16 NOTE — Telephone Encounter (Signed)
Patient's wife called to report that he has become increasingly tired and lacks energy. In the past two weeks he has lost approximately 10 pounds with no change in appetite.He complains of feeling "terrible". She would like to speak with a team member. He does not have an appointment until late March.

## 2020-10-16 NOTE — Telephone Encounter (Signed)
Copied from [place] [id number]. Topic: General - Call Back - No Documentation >> Oct 16, 2020 10:50 AM Erick Blinks wrote: Reason for CRM: Pt's wife called Jackelyn Poling) to report that patient has been losing weight for the past two weeks however he has not made any changes to his diet. Wants to know if pt should see PCP or oncologist, please advise   Best contact: [phone]  Does pt need apt with [place] or oncologist.

## 2020-10-16 NOTE — Telephone Encounter (Signed)
Called and spoke to wife, who stated that patient has lost about 10 pounds over the last 2-3 weeks even though he has maintained a good appetite, and reports fatigue and weakness. Patient offered appointment with symptom management for 1/26, but states they are not able come due to other appointments that day. Requested 1/27 instead. Labs and [facility] appointments were scheduled for 10/18/20 as requested. Pt's wife is aware of appointment times.

## 2020-10-16 NOTE — Telephone Encounter (Signed)
Routing to provider to advise.  

## 2020-10-17 DIAGNOSIS — N186 End stage renal disease: Secondary | ICD-10-CM | POA: Diagnosis not present

## 2020-10-17 DIAGNOSIS — Z992 Dependence on renal dialysis: Secondary | ICD-10-CM | POA: Diagnosis not present

## 2020-10-17 NOTE — Telephone Encounter (Signed)
Pt's (wife) per DPR stated pt has apt on 10/18/2020 at cancer center and declined apt with [place] at this time.

## 2020-10-17 NOTE — Telephone Encounter (Signed)
Appt

## 2020-10-18 ENCOUNTER — Other Ambulatory Visit: Payer: Self-pay

## 2020-10-18 ENCOUNTER — Inpatient Hospital Stay (HOSPITAL_BASED_OUTPATIENT_CLINIC_OR_DEPARTMENT_OTHER): Payer: Medicare HMO | Admitting: Nurse Practitioner

## 2020-10-18 ENCOUNTER — Inpatient Hospital Stay: Payer: Medicare HMO | Attending: Nurse Practitioner

## 2020-10-18 VITALS — BP 141/67 | HR 71 | Temp 98.6°F | Resp 18 | Wt 163.0 lb

## 2020-10-18 DIAGNOSIS — Z8249 Family history of ischemic heart disease and other diseases of the circulatory system: Secondary | ICD-10-CM | POA: Diagnosis not present

## 2020-10-18 DIAGNOSIS — Z79899 Other long term (current) drug therapy: Secondary | ICD-10-CM | POA: Insufficient documentation

## 2020-10-18 DIAGNOSIS — F32A Depression, unspecified: Secondary | ICD-10-CM | POA: Diagnosis not present

## 2020-10-18 DIAGNOSIS — D471 Chronic myeloproliferative disease: Secondary | ICD-10-CM | POA: Diagnosis not present

## 2020-10-18 DIAGNOSIS — J449 Chronic obstructive pulmonary disease, unspecified: Secondary | ICD-10-CM | POA: Diagnosis not present

## 2020-10-18 DIAGNOSIS — I2510 Atherosclerotic heart disease of native coronary artery without angina pectoris: Secondary | ICD-10-CM | POA: Diagnosis not present

## 2020-10-18 DIAGNOSIS — Z7982 Long term (current) use of aspirin: Secondary | ICD-10-CM | POA: Diagnosis not present

## 2020-10-18 DIAGNOSIS — Z800 Family history of malignant neoplasm of digestive organs: Secondary | ICD-10-CM | POA: Insufficient documentation

## 2020-10-18 DIAGNOSIS — Z803 Family history of malignant neoplasm of breast: Secondary | ICD-10-CM | POA: Diagnosis not present

## 2020-10-18 DIAGNOSIS — N186 End stage renal disease: Secondary | ICD-10-CM | POA: Diagnosis not present

## 2020-10-18 DIAGNOSIS — E785 Hyperlipidemia, unspecified: Secondary | ICD-10-CM | POA: Insufficient documentation

## 2020-10-18 DIAGNOSIS — Z7952 Long term (current) use of systemic steroids: Secondary | ICD-10-CM | POA: Insufficient documentation

## 2020-10-18 DIAGNOSIS — Z8042 Family history of malignant neoplasm of prostate: Secondary | ICD-10-CM | POA: Insufficient documentation

## 2020-10-18 DIAGNOSIS — F17210 Nicotine dependence, cigarettes, uncomplicated: Secondary | ICD-10-CM | POA: Insufficient documentation

## 2020-10-18 DIAGNOSIS — Z801 Family history of malignant neoplasm of trachea, bronchus and lung: Secondary | ICD-10-CM | POA: Diagnosis not present

## 2020-10-18 DIAGNOSIS — Z992 Dependence on renal dialysis: Secondary | ICD-10-CM | POA: Diagnosis not present

## 2020-10-18 DIAGNOSIS — I120 Hypertensive chronic kidney disease with stage 5 chronic kidney disease or end stage renal disease: Secondary | ICD-10-CM | POA: Insufficient documentation

## 2020-10-18 LAB — CBC WITH DIFFERENTIAL/PLATELET
Abs Immature Granulocytes: 0.03 10*3/uL (ref 0.00–0.07)
Basophils Absolute: 0 10*3/uL (ref 0.0–0.1)
Basophils Relative: 0 %
Eosinophils Absolute: 0 10*3/uL (ref 0.0–0.5)
Eosinophils Relative: 0 %
HCT: 30.3 % — ABNORMAL LOW (ref 39.0–52.0)
Hemoglobin: 10.4 g/dL — ABNORMAL LOW (ref 13.0–17.0)
Immature Granulocytes: 1 %
Lymphocytes Relative: 8 %
Lymphs Abs: 0.4 10*3/uL — ABNORMAL LOW (ref 0.7–4.0)
MCH: 30.1 pg (ref 26.0–34.0)
MCHC: 34.3 g/dL (ref 30.0–36.0)
MCV: 87.8 fL (ref 80.0–100.0)
Monocytes Absolute: 0.1 10*3/uL (ref 0.1–1.0)
Monocytes Relative: 3 %
Neutro Abs: 4.6 10*3/uL (ref 1.7–7.7)
Neutrophils Relative %: 88 %
Platelets: 126 10*3/uL — ABNORMAL LOW (ref 150–400)
RBC: 3.45 MIL/uL — ABNORMAL LOW (ref 4.22–5.81)
RDW: 15.6 % — ABNORMAL HIGH (ref 11.5–15.5)
WBC: 5.2 10*3/uL (ref 4.0–10.5)
nRBC: 0 % (ref 0.0–0.2)

## 2020-10-18 LAB — COMPREHENSIVE METABOLIC PANEL
ALT: 17 U/L (ref 0–44)
AST: 14 U/L — ABNORMAL LOW (ref 15–41)
Albumin: 3.3 g/dL — ABNORMAL LOW (ref 3.5–5.0)
Alkaline Phosphatase: 56 U/L (ref 38–126)
Anion gap: 10 (ref 5–15)
BUN: 49 mg/dL — ABNORMAL HIGH (ref 8–23)
CO2: 22 mmol/L (ref 22–32)
Calcium: 8.1 mg/dL — ABNORMAL LOW (ref 8.9–10.3)
Chloride: 99 mmol/L (ref 98–111)
Creatinine, Ser: 6.95 mg/dL — ABNORMAL HIGH (ref 0.61–1.24)
GFR, Estimated: 8 mL/min — ABNORMAL LOW (ref >60)
Glucose, Bld: 104 mg/dL — ABNORMAL HIGH (ref 70–99)
Potassium: 5.2 mmol/L — ABNORMAL HIGH (ref 3.5–5.1)
Sodium: 131 mmol/L — ABNORMAL LOW (ref 135–145)
Total Bilirubin: 0.9 mg/dL (ref 0.3–1.2)
Total Protein: 6.4 g/dL — ABNORMAL LOW (ref 6.5–8.1)

## 2020-10-18 LAB — LACTATE DEHYDROGENASE: LDH: 634 U/L — ABNORMAL HIGH (ref 98–192)

## 2020-10-18 NOTE — Progress Notes (Signed)
Symptom Management [place]  [phone] [phone]  Patient Care Team: Valerie Roys, DO as PCP - General (Family Medicine) End, Harrell Gave, MD as PCP - Cardiology (Cardiology) Cammie Sickle, MD as Consulting Physician (Internal Medicine)   Name of the patient: James Arnold  [id number]  [birth year]   Date of visit: 10/18/20  Diagnosis-primary myelofibrosis  Chief complaint/ Reason for visit-weight loss  Heme/Onc history:  Oncology History Overview Note  # 2010- PRIMARY MYELOFIBROSIS; Jak-2 positive;cytogenetics- Not done [bmbx- 2010] 2010- spleen- 13cm; Dynamic IPS- LOW [0-risk factor]; [place] 2017 AUG spleen-13cm; March 2019-bone marrow biopsy myelofibrosis/no blasts.   # AUGUST 1st week 2019- Retacrit   # October 2nd 2019- jakafi 10 BID [? Renal involvement]; September 2020-peritoneal dialysis; Jakafi 10 mg in the morning; STOPPED/tapered QMVH8IO, 2021 [sec intolerance fatigue/myalgias]  # CKD 1.5; poorly controlled HTN; AUG 2018- worsening of renal function Creat ~2.1; AUG 2018- Bil Kidney US- NEG for hydronephrosis. [name]- kidney Bx- FSG [ on Prednisone Dr.[name]]; July 2020-peritoneal dialysis;   # hx of Lung nodules- resolved [[name]]/quit smoking.  DIAGNOSIS: PRIMARY MYELOFIBROSIS  RISK:LOW     ;GOALS: Control  CURRENT/MOST RECENT THERAPY : Surveillance    Primary myelofibrosis ([place])    Interval history-patient is 64 year old male with above history of primary myelofibrosis, currently on surveillance due to intolerance to [place], PMH significant for CAD, HTN, HLD, diastolic dysfunction, CKD IV-V on peritoneal dialysis, anemia, who presents to symptom management clinic for unintentional weight loss.  Over the past 2 weeks he has noticed a 10 pound weight loss.  Feels tired and weak.  Nothing seems to make symptoms better or worse.  Was recently diagnosed with COPD per his son-in-law who accompanies him  to visit today.  No cough or congestion.  Appetite is normal. Complains of intermittent nausea. Hasn't taken anything for his symptoms.   Review of systems- Review of Systems  Constitutional: Positive for malaise/fatigue and weight loss. Negative for chills and fever.  HENT: Negative for hearing loss, nosebleeds, sore throat and tinnitus.   Eyes: Negative for blurred vision and double vision.  Respiratory: Negative for cough, hemoptysis, shortness of breath and wheezing.   Cardiovascular: Negative for chest pain, palpitations and leg swelling.  Gastrointestinal: Positive for nausea. Negative for abdominal pain, blood in stool, constipation, diarrhea, melena and vomiting.  Genitourinary: Negative for dysuria and urgency.  Musculoskeletal: Negative for back pain, falls, joint pain and myalgias.  Skin: Negative for itching and rash.  Neurological: Positive for weakness. Negative for dizziness, tingling, sensory change, loss of consciousness and headaches.  Endo/Heme/Allergies: Negative for environmental allergies. Does not bruise/bleed easily.  Psychiatric/Behavioral: Negative for depression. The patient is not nervous/anxious and does not have insomnia.      No Known Allergies  Past Medical History:  Diagnosis Date  . Acute renal failure (ARF) ([place]) 05/15/2017  . Anemia   . Benign hypertensive renal disease   . CAD (coronary artery disease)    a. 08/1996 s/p BMS to the mLAD ([facility]); b. 12/2017 MV: EF 46%, fixed apical defect w/ significant GI uptake artifact. No ischemia-> low risk.  . CKD (chronic kidney disease), stage IV ([place])   . Depression   . Diastolic dysfunction    a. 12/2017 Echo: EF 60-65%, no rwma, Gr1 DD, mild AI/MR. Nl RVSP.  [name] Kitchen Dyspnea    with exertion  . FSGS (focal segmental glomerulosclerosis)   . Hyperlipidemia 10/2002  . Hypertension   . Myelofibrosis ([place]) 2010  .  Smoker   . Thrombocytopenia ([place])     Past Surgical History:  Procedure Laterality Date  .  ANGIOPLASTY    . BONE MARROW ASPIRATION  03/2009  . CARDIAC CATHETERIZATION    . COLONOSCOPY WITH PROPOFOL N/A 08/09/2018   Procedure: COLONOSCOPY WITH PROPOFOL;  Surgeon: [name], MD;  Location: [facility] ENDOSCOPY;  Service: Gastroenterology;  Laterality: N/A;  . CORONARY ARTERY BYPASS GRAFT    . CORONARY STENT PLACEMENT  08/1996    Social History   Socioeconomic History  . Marital status: Married    Spouse name: Not on file  . Number of children: Not on file  . Years of education: Not on file  . Highest education level: Not on file  Occupational History  . Not on file  Tobacco Use  . Smoking status: Current Every Day Smoker    Packs/day: 1.50    Years: 47.00    Pack years: 70.50    Types: Cigarettes  . Smokeless tobacco: Never Used  Vaping Use  . Vaping Use: Never used  Substance and Sexual Activity  . Alcohol use: Yes    Alcohol/week: 7.0 standard drinks    Types: 7 Cans of beer per week  . Drug use: No  . Sexual activity: Yes  Other Topics Concern  . Not on file  Social History Narrative  . Not on file   Social Determinants of Health   Financial Resource Strain: Not on file  Food Insecurity: Not on file  Transportation Needs: Not on file  Physical Activity: Not on file  Stress: Not on file  Social Connections: Not on file  Intimate Partner Violence: Not on file    Family History  Problem Relation Age of Onset  . Stroke Mother        Low BP stroke  . Depression Father   . Depression Sister        Breast  . Heart disease Other   . Breast cancer Other   . Lung cancer Other   . Ovarian cancer Other   . Stomach cancer Other      Current Outpatient Medications:  .  acetaminophen (TYLENOL) 500 MG tablet, Take 1,000 mg by mouth every 6 (six) hours as needed. , Disp: , Rfl:  .  amLODipine (NORVASC) 10 MG tablet, Take 1 tablet (10 mg total) by mouth daily., Disp: 90 tablet, Rfl: 1 .  aspirin EC 81 MG tablet, Take by mouth., Disp: , Rfl:  .  carvedilol  (COREG) 12.5 MG tablet, Take 1.5 tablets (18.75 mg total) by mouth 2 (two) times daily., Disp: 270 tablet, Rfl: 3 .  Cholecalciferol (VITAMIN D-1000 MAX ST) 25 MCG (1000 UT) tablet, Take 1,000 Units by mouth daily. , Disp: , Rfl:  .  citalopram (CELEXA) 40 MG tablet, TAKE 1 TABLET EVERY DAY, Disp: 90 tablet, Rfl: 1 .  epoetin alfa-epbx (RETACRIT) 88280 UNIT/ML injection, Inject into the skin., Disp: , Rfl:  .  ferrous sulfate 325 (65 FE) MG tablet, Take 1 tablet (325 mg total) by mouth daily with breakfast., Disp: 30 tablet, Rfl: 1 .  furosemide (LASIX) 20 MG tablet, Take 1 tablet (20 mg total) by mouth daily., Disp: 90 tablet, Rfl: 1 .  gentamicin cream (GARAMYCIN) 0.1 %, APP  CATHETER EXIT SITE D, Disp: , Rfl:  .  losartan (COZAAR) 50 MG tablet, Take 1 tablet (50 mg total) by mouth daily., Disp: 90 tablet, Rfl: 1 .  predniSONE (DELTASONE) 10 MG tablet, Take 10 mg by mouth  daily with breakfast., Disp: , Rfl:  .  traMADol (ULTRAM) 50 MG tablet, Take by mouth., Disp: , Rfl:   Physical exam:  Vitals:   10/18/20 0955  BP: (!) 141/67  Pulse: 71  Resp: 18  Temp: 98.6 F (37 C)  TempSrc: Tympanic  SpO2: 96%  Weight: 163 lb (73.9 kg)   Physical Exam Constitutional:      General: He is not in acute distress.    Appearance: He is well-developed and well-nourished. He is not ill-appearing.  HENT:     Head: Normocephalic and atraumatic.     Nose: Nose normal.     Mouth/Throat:     Mouth: Oropharynx is clear and moist.     Pharynx: No oropharyngeal exudate.  Eyes:     General: No scleral icterus.    Extraocular Movements: EOM normal.     Conjunctiva/sclera: Conjunctivae normal.  Cardiovascular:     Rate and Rhythm: Normal rate and regular rhythm.  Pulmonary:     Effort: Pulmonary effort is normal.     Breath sounds: Normal breath sounds. No wheezing.  Abdominal:     General: There is no distension.     Palpations: Abdomen is soft.     Tenderness: There is no guarding.   Musculoskeletal:        General: No edema. Normal range of motion.     Cervical back: Normal range of motion and neck supple.  Skin:    General: Skin is warm and dry.  Neurological:     Mental Status: He is alert and oriented to person, place, and time.     Comments: ambulatory  Psychiatric:        Mood and Affect: Mood and affect and mood normal.        Behavior: Behavior normal.      CMP Latest Ref Rng & Units 10/18/2020  Glucose 70 - 99 mg/dL 104(H)  BUN 8 - 23 mg/dL 49(H)  Creatinine 0.61 - 1.24 mg/dL 6.95(H)  Sodium 135 - 145 mmol/L 131(L)  Potassium 3.5 - 5.1 mmol/L 5.2(H)  Chloride 98 - 111 mmol/L 99  CO2 22 - 32 mmol/L 22  Calcium 8.9 - 10.3 mg/dL 8.1(L)  Total Protein 6.5 - 8.1 g/dL 6.4(L)  Total Bilirubin 0.3 - 1.2 mg/dL 0.9  Alkaline Phos 38 - 126 U/L 56  AST 15 - 41 U/L 14(L)  ALT 0 - 44 U/L 17   CBC Latest Ref Rng & Units 10/18/2020  WBC 4.0 - 10.5 K/uL 5.2  Hemoglobin 13.0 - 17.0 g/dL 10.4(L)  Hematocrit 39.0 - 52.0 % 30.3(L)  Platelets 150 - 400 K/uL 126(L)    No images are attached to the encounter.  No results found.  Assessment and plan- Patient is a 65 y.o. male diagnosed with primary myelofibrosis currently on surveillance, who presents with symptom breathing treatment clinic for unintentional weight loss and increased fatigue.  LDH trending up.  A lot of the symptoms could surveyed for progression of disease.  Discussed with Dr. Rogue Bussing who agrees with recommendation to move up spleen ultrasound. Patient to follow up with Dr. Rogue Bussing for results. In the interim, will increase daily prednisone to 20 mg daily to help stimulate appetite and reduce fatigue. Start zofran odt for nausea. Return to clinic if symptoms do not improve or worsen in the interim.    Visit Diagnosis 1. Primary myelofibrosis ([place])    Patient expressed understanding and was in agreement with this plan. He also understands that He can  call clinic at any time with any questions,  concerns, or complaints.   Thank you for allowing me to participate in the care of this very pleasant patient.   Beckey Rutter, DNP, AGNP-C [place] [facility]

## 2020-10-19 DIAGNOSIS — N186 End stage renal disease: Secondary | ICD-10-CM | POA: Diagnosis not present

## 2020-10-19 DIAGNOSIS — Z992 Dependence on renal dialysis: Secondary | ICD-10-CM | POA: Diagnosis not present

## 2020-10-19 MED ORDER — ONDANSETRON 8 MG PO TBDP: 8.0000 mg | ORAL_TABLET | Freq: Three times a day (TID) | ORAL | 1 refills | Status: DC | PRN

## 2020-10-19 MED ORDER — PREDNISONE 20 MG PO TABS: 20.0000 mg | ORAL_TABLET | Freq: Every day | ORAL | 0 refills | Status: AC

## 2020-10-21 DIAGNOSIS — Z992 Dependence on renal dialysis: Secondary | ICD-10-CM | POA: Diagnosis not present

## 2020-10-21 DIAGNOSIS — N186 End stage renal disease: Secondary | ICD-10-CM | POA: Diagnosis not present

## 2020-10-22 DIAGNOSIS — N186 End stage renal disease: Secondary | ICD-10-CM | POA: Diagnosis not present

## 2020-10-22 DIAGNOSIS — Z992 Dependence on renal dialysis: Secondary | ICD-10-CM | POA: Diagnosis not present

## 2020-10-23 ENCOUNTER — Ambulatory Visit (INDEPENDENT_AMBULATORY_CARE_PROVIDER_SITE_OTHER): Payer: Medicare HMO | Admitting: Family Medicine

## 2020-10-23 ENCOUNTER — Encounter: Payer: Self-pay | Admitting: Family Medicine

## 2020-10-23 ENCOUNTER — Other Ambulatory Visit: Payer: Self-pay

## 2020-10-23 DIAGNOSIS — N186 End stage renal disease: Secondary | ICD-10-CM | POA: Diagnosis not present

## 2020-10-23 DIAGNOSIS — D471 Chronic myeloproliferative disease: Secondary | ICD-10-CM | POA: Diagnosis not present

## 2020-10-23 DIAGNOSIS — Z992 Dependence on renal dialysis: Secondary | ICD-10-CM | POA: Diagnosis not present

## 2020-10-23 NOTE — Progress Notes (Signed)
BP 119/67   Pulse 73   Temp 98.6 F (37 C) (Oral)   Wt 160 lb 3.2 oz (72.7 kg)   SpO2 97%   BMI 25.09 kg/m    Subjective:    [name], male    DOB: 1956/05/22, 65 y.o.   MRN: [id number]  HPI: James Arnold is a 65 y.o. male  Chief Complaint  Patient presents with  . Paperwork    Pt states he is here to have disability papers filled out, states we fill these out because he is not able to work. States he hasn't been able to work since 2010   James Arnold has been out of work due to fatigue with his myelofibrosis and being on dialysis. He is stable. He continues with fatigue and SOB. He has been feeling stable. Continues to follow with hematology and nephrology. He has no other concerns or complaints at this time.   Relevant past medical, surgical, family and social history reviewed and updated as indicated. Interim medical history since our last visit reviewed. Allergies and medications reviewed and updated.  Review of Systems  Constitutional: Positive for fatigue. Negative for activity change, appetite change, chills, diaphoresis, fever and unexpected weight change.  Respiratory: Negative.   Cardiovascular: Negative.   Gastrointestinal: Negative.   Musculoskeletal: Negative.   Neurological: Negative.   Psychiatric/Behavioral: Negative.     Per HPI unless specifically indicated above     Objective:    BP 119/67   Pulse 73   Temp 98.6 F (37 C) (Oral)   Wt 160 lb 3.2 oz (72.7 kg)   SpO2 97%   BMI 25.09 kg/m   Wt Readings from Last 3 Encounters:  10/29/20 166 lb (75.3 kg)  10/23/20 160 lb 3.2 oz (72.7 kg)  10/18/20 163 lb (73.9 kg)    Physical Exam Vitals and nursing note reviewed.  Constitutional:      General: He is not in acute distress.    Appearance: Normal appearance. He is not ill-appearing, toxic-appearing or diaphoretic.  HENT:     Head: Normocephalic and atraumatic.     Right Ear: External ear normal.     Left Ear: External ear normal.      Nose: Nose normal.     Mouth/Throat:     Mouth: Mucous membranes are moist.     Pharynx: Oropharynx is clear.  Eyes:     General: No scleral icterus.       Right eye: No discharge.        Left eye: No discharge.     Extraocular Movements: Extraocular movements intact.     Conjunctiva/sclera: Conjunctivae normal.     Pupils: Pupils are equal, round, and reactive to light.  Cardiovascular:     Rate and Rhythm: Normal rate and regular rhythm.     Pulses: Normal pulses.     Heart sounds: Normal heart sounds. No murmur heard. No friction rub. No gallop.   Pulmonary:     Effort: Pulmonary effort is normal. No respiratory distress.     Breath sounds: Normal breath sounds. No stridor. No wheezing, rhonchi or rales.  Chest:     Chest wall: No tenderness.  Musculoskeletal:        General: Normal range of motion.     Cervical back: Normal range of motion and neck supple.  Skin:    General: Skin is warm and dry.     Capillary Refill: Capillary refill takes less than 2 seconds.  Coloration: Skin is not jaundiced or pale.     Findings: No bruising, erythema, lesion or rash.  Neurological:     General: No focal deficit present.     Mental Status: He is alert and oriented to person, place, and time. Mental status is at baseline.  Psychiatric:        Mood and Affect: Mood normal.        Behavior: Behavior normal.        Thought Content: Thought content normal.        Judgment: Judgment normal.     Results for orders placed or performed in visit on 10/18/20  Lactate dehydrogenase  Result Value Ref Range   LDH 634 (H) 98 - 192 U/L  Comprehensive metabolic panel  Result Value Ref Range   Sodium 131 (L) 135 - 145 mmol/L   Potassium 5.2 (H) 3.5 - 5.1 mmol/L   Chloride 99 98 - 111 mmol/L   CO2 22 22 - 32 mmol/L   Glucose, Bld 104 (H) 70 - 99 mg/dL   BUN 49 (H) 8 - 23 mg/dL   Creatinine, Ser 6.95 (H) 0.61 - 1.24 mg/dL   Calcium 8.1 (L) 8.9 - 10.3 mg/dL   Total Protein 6.4 (L)  6.5 - 8.1 g/dL   Albumin 3.3 (L) 3.5 - 5.0 g/dL   AST 14 (L) 15 - 41 U/L   ALT 17 0 - 44 U/L   Alkaline Phosphatase 56 38 - 126 U/L   Total Bilirubin 0.9 0.3 - 1.2 mg/dL   GFR, Estimated 8 (L) >60 mL/min   Anion gap 10 5 - 15  CBC with Differential/Platelet  Result Value Ref Range   WBC 5.2 4.0 - 10.5 K/uL   RBC 3.45 (L) 4.22 - 5.81 MIL/uL   Hemoglobin 10.4 (L) 13.0 - 17.0 g/dL   HCT 30.3 (L) 39.0 - 52.0 %   MCV 87.8 80.0 - 100.0 fL   MCH 30.1 26.0 - 34.0 pg   MCHC 34.3 30.0 - 36.0 g/dL   RDW 15.6 (H) 11.5 - 15.5 %   Platelets 126 (L) 150 - 400 K/uL   nRBC 0.0 0.0 - 0.2 %   Neutrophils Relative % 88 %   Neutro Abs 4.6 1.7 - 7.7 K/uL   Lymphocytes Relative 8 %   Lymphs Abs 0.4 (L) 0.7 - 4.0 K/uL   Monocytes Relative 3 %   Monocytes Absolute 0.1 0.1 - 1.0 K/uL   Eosinophils Relative 0 %   Eosinophils Absolute 0.0 0.0 - 0.5 K/uL   Basophils Relative 0 %   Basophils Absolute 0.0 0.0 - 0.1 K/uL   Immature Granulocytes 1 %   Abs Immature Granulocytes 0.03 0.00 - 0.07 K/uL      Assessment & Plan:   Problem List Items Addressed This Visit      Other   Primary myelofibrosis ([place])    Has been out of work due to fatigue and dialysis. Will fill out his paperwork for him. No other concerns.           Follow up plan: Return if symptoms worsen or fail to improve.

## 2020-10-24 ENCOUNTER — Ambulatory Visit
Admission: RE | Admit: 2020-10-24 | Discharge: 2020-10-24 | Disposition: A | Discharge status: Home / Self Care | Payer: Medicare HMO | Source: Ambulatory Visit | Attending: Orthopedic Surgery | Admitting: Orthopedic Surgery

## 2020-10-24 ENCOUNTER — Other Ambulatory Visit: Payer: Self-pay

## 2020-10-24 DIAGNOSIS — M12812 Other specific arthropathies, not elsewhere classified, left shoulder: Secondary | ICD-10-CM | POA: Diagnosis not present

## 2020-10-24 DIAGNOSIS — M6258 Muscle wasting and atrophy, not elsewhere classified, other site: Secondary | ICD-10-CM | POA: Diagnosis not present

## 2020-10-24 DIAGNOSIS — N186 End stage renal disease: Secondary | ICD-10-CM | POA: Diagnosis not present

## 2020-10-24 DIAGNOSIS — S46012A Strain of muscle(s) and tendon(s) of the rotator cuff of left shoulder, initial encounter: Secondary | ICD-10-CM | POA: Diagnosis not present

## 2020-10-24 DIAGNOSIS — M75122 Complete rotator cuff tear or rupture of left shoulder, not specified as traumatic: Secondary | ICD-10-CM | POA: Diagnosis not present

## 2020-10-24 DIAGNOSIS — M25412 Effusion, left shoulder: Secondary | ICD-10-CM | POA: Diagnosis not present

## 2020-10-24 DIAGNOSIS — M19012 Primary osteoarthritis, left shoulder: Secondary | ICD-10-CM | POA: Diagnosis not present

## 2020-10-24 DIAGNOSIS — Z992 Dependence on renal dialysis: Secondary | ICD-10-CM | POA: Diagnosis not present

## 2020-10-25 DIAGNOSIS — Z992 Dependence on renal dialysis: Secondary | ICD-10-CM | POA: Diagnosis not present

## 2020-10-25 DIAGNOSIS — N186 End stage renal disease: Secondary | ICD-10-CM | POA: Diagnosis not present

## 2020-10-26 ENCOUNTER — Other Ambulatory Visit: Payer: Self-pay

## 2020-10-26 ENCOUNTER — Other Ambulatory Visit: Payer: Self-pay | Admitting: Orthopedic Surgery

## 2020-10-26 ENCOUNTER — Ambulatory Visit
Admission: RE | Admit: 2020-10-26 | Discharge: 2020-10-26 | Disposition: A | Discharge status: Home / Self Care | Payer: Medicare HMO | Source: Ambulatory Visit | Attending: Internal Medicine | Admitting: Internal Medicine

## 2020-10-26 DIAGNOSIS — N186 End stage renal disease: Secondary | ICD-10-CM | POA: Diagnosis not present

## 2020-10-26 DIAGNOSIS — Z992 Dependence on renal dialysis: Secondary | ICD-10-CM | POA: Diagnosis not present

## 2020-10-26 DIAGNOSIS — R161 Splenomegaly, not elsewhere classified: Secondary | ICD-10-CM | POA: Diagnosis not present

## 2020-10-26 DIAGNOSIS — D471 Chronic myeloproliferative disease: Secondary | ICD-10-CM

## 2020-10-28 DIAGNOSIS — Z992 Dependence on renal dialysis: Secondary | ICD-10-CM | POA: Diagnosis not present

## 2020-10-28 DIAGNOSIS — N186 End stage renal disease: Secondary | ICD-10-CM | POA: Diagnosis not present

## 2020-10-29 ENCOUNTER — Inpatient Hospital Stay: Payer: Medicare HMO | Attending: Internal Medicine

## 2020-10-29 ENCOUNTER — Encounter: Payer: Self-pay | Admitting: Internal Medicine

## 2020-10-29 ENCOUNTER — Inpatient Hospital Stay (HOSPITAL_BASED_OUTPATIENT_CLINIC_OR_DEPARTMENT_OTHER): Payer: Medicare HMO | Admitting: Internal Medicine

## 2020-10-29 VITALS — BP 141/72 | HR 79 | Temp 97.6°F | Resp 16 | Ht 67.0 in | Wt 166.0 lb

## 2020-10-29 DIAGNOSIS — J449 Chronic obstructive pulmonary disease, unspecified: Secondary | ICD-10-CM | POA: Insufficient documentation

## 2020-10-29 DIAGNOSIS — D631 Anemia in chronic kidney disease: Secondary | ICD-10-CM | POA: Diagnosis not present

## 2020-10-29 DIAGNOSIS — F17210 Nicotine dependence, cigarettes, uncomplicated: Secondary | ICD-10-CM | POA: Insufficient documentation

## 2020-10-29 DIAGNOSIS — Z992 Dependence on renal dialysis: Secondary | ICD-10-CM | POA: Diagnosis not present

## 2020-10-29 DIAGNOSIS — R0609 Other forms of dyspnea: Secondary | ICD-10-CM

## 2020-10-29 DIAGNOSIS — D471 Chronic myeloproliferative disease: Secondary | ICD-10-CM | POA: Insufficient documentation

## 2020-10-29 DIAGNOSIS — N184 Chronic kidney disease, stage 4 (severe): Secondary | ICD-10-CM | POA: Insufficient documentation

## 2020-10-29 DIAGNOSIS — R0600 Dyspnea, unspecified: Secondary | ICD-10-CM | POA: Diagnosis not present

## 2020-10-29 DIAGNOSIS — N186 End stage renal disease: Secondary | ICD-10-CM | POA: Diagnosis not present

## 2020-10-29 LAB — CBC WITH DIFFERENTIAL/PLATELET
Abs Immature Granulocytes: 0.1 10*3/uL — ABNORMAL HIGH (ref 0.00–0.07)
Basophils Absolute: 0 10*3/uL (ref 0.0–0.1)
Basophils Relative: 0 %
Eosinophils Absolute: 0 10*3/uL (ref 0.0–0.5)
Eosinophils Relative: 0 %
HCT: 27.4 % — ABNORMAL LOW (ref 39.0–52.0)
Hemoglobin: 9.3 g/dL — ABNORMAL LOW (ref 13.0–17.0)
Immature Granulocytes: 2 %
Lymphocytes Relative: 6 %
Lymphs Abs: 0.4 10*3/uL — ABNORMAL LOW (ref 0.7–4.0)
MCH: 30 pg (ref 26.0–34.0)
MCHC: 33.9 g/dL (ref 30.0–36.0)
MCV: 88.4 fL (ref 80.0–100.0)
Monocytes Absolute: 0.2 10*3/uL (ref 0.1–1.0)
Monocytes Relative: 3 %
Neutro Abs: 6 10*3/uL (ref 1.7–7.7)
Neutrophils Relative %: 89 %
Platelets: 148 10*3/uL — ABNORMAL LOW (ref 150–400)
RBC: 3.1 MIL/uL — ABNORMAL LOW (ref 4.22–5.81)
RDW: 15.5 % (ref 11.5–15.5)
WBC: 6.6 10*3/uL (ref 4.0–10.5)
nRBC: 0 % (ref 0.0–0.2)

## 2020-10-29 LAB — COMPREHENSIVE METABOLIC PANEL
ALT: 13 U/L (ref 0–44)
AST: 12 U/L — ABNORMAL LOW (ref 15–41)
Albumin: 3.1 g/dL — ABNORMAL LOW (ref 3.5–5.0)
Alkaline Phosphatase: 41 U/L (ref 38–126)
Anion gap: 13 (ref 5–15)
BUN: 82 mg/dL — ABNORMAL HIGH (ref 8–23)
CO2: 19 mmol/L — ABNORMAL LOW (ref 22–32)
Calcium: 6.8 mg/dL — ABNORMAL LOW (ref 8.9–10.3)
Chloride: 105 mmol/L (ref 98–111)
Creatinine, Ser: 7.14 mg/dL — ABNORMAL HIGH (ref 0.61–1.24)
GFR, Estimated: 8 mL/min — ABNORMAL LOW (ref >60)
Glucose, Bld: 107 mg/dL — ABNORMAL HIGH (ref 70–99)
Potassium: 5 mmol/L (ref 3.5–5.1)
Sodium: 137 mmol/L (ref 135–145)
Total Bilirubin: 0.5 mg/dL (ref 0.3–1.2)
Total Protein: 6 g/dL — ABNORMAL LOW (ref 6.5–8.1)

## 2020-10-29 LAB — LACTATE DEHYDROGENASE: LDH: 606 U/L — ABNORMAL HIGH (ref 98–192)

## 2020-10-29 NOTE — Progress Notes (Signed)
[facility] OFFICE PROGRESS NOTE  Patient Care Team: Valerie Roys, DO as PCP - General (Family Medicine) End, Harrell Gave, MD as PCP - Cardiology (Cardiology) Cammie Sickle, MD as Consulting Physician (Internal Medicine)  Cancer Staging No matching staging information was found for the patient.   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   # AUGUST 1st week 2019- Retacrit   # October 2nd 2019- jakafi 10 BID [? 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 [name]- kidney Bx- FSG [ on Prednisone Dr.[name]]; July 2020-peritoneal dialysis;   # hx of Lung nodules- resolved [[name]]/quit smoking.  DIAGNOSIS: PRIMARY MYELOFIBROSIS  RISK:LOW     ;GOALS: Control  CURRENT/MOST RECENT THERAPY : Surveillance    Primary myelofibrosis ([place])      INTERVAL HISTORY:  James Arnold 65 y.o.  male pleasant patient above history of primary myelofibrosis Jak 2+ currently OFF Jakafi [since July 2021sec to intol]; chronic kidney disease on peritoneal dialysis is here for follow-up/review results of the ultrasound.  Patient was evaluated by symptomatic management for worsening fatigue.  Gets short of breath with minimal exertion.  Unfortunately continues to smoke.  Does not use inhalers as recommended.  Patient complains of weight loss.  No night sweats.  Overall feels poorly.  Patient continues to get peritoneal dialysis.  No abdominal pain nausea vomiting.    Review of Systems  Constitutional: Positive for malaise/fatigue and  weight loss. Negative for chills, diaphoresis and fever.  HENT: Negative for nosebleeds and sore throat.   Eyes: Negative for double vision.  Respiratory: Positive for cough and shortness of breath. Negative for hemoptysis, sputum production and wheezing.   Cardiovascular: Negative for chest pain, palpitations and orthopnea.  Gastrointestinal: Negative for abdominal pain, blood in stool, constipation, diarrhea, heartburn, melena, nausea and vomiting.  Genitourinary: Negative for dysuria, frequency and urgency.  Musculoskeletal: Positive for back pain and joint pain.  Skin: Negative.  Negative for itching and rash.  Neurological: Negative for dizziness, tingling, focal weakness, weakness and headaches.  Endo/Heme/Allergies: Does not bruise/bleed easily.  Psychiatric/Behavioral: Negative for depression. The patient is not nervous/anxious and does not have insomnia.       PAST MEDICAL HISTORY :  Past Medical History:  Diagnosis Date  . Acute renal failure (ARF) ([place]) 05/15/2017  . Anemia   . Benign hypertensive renal disease   . CAD (coronary artery disease)    a. 08/1996 s/p BMS to the mLAD ([facility]); b. 12/2017 MV: EF 46%, fixed apical defect w/ significant GI uptake artifact. No ischemia-> low risk.  . CKD (chronic kidney disease), stage IV ([place])   . Depression   . Diastolic dysfunction    a. 12/2017 Echo: EF 60-65%, no rwma, Gr1 DD, mild AI/MR. Nl RVSP.  [name] Kitchen Dyspnea    with exertion  . FSGS (focal segmental glomerulosclerosis)   . Hyperlipidemia 10/2002  . Hypertension   . Myelofibrosis ([place]) 2010  . Smoker   . Thrombocytopenia ([place])     PAST SURGICAL HISTORY :   Past Surgical History:  Procedure Laterality Date  . ANGIOPLASTY    . BONE MARROW ASPIRATION  03/2009  . CARDIAC  CATHETERIZATION    . COLONOSCOPY WITH PROPOFOL N/A 08/09/2018   Procedure: COLONOSCOPY WITH PROPOFOL;  Surgeon: [name], MD;  [facility];  Service: Gastroenterology;  Laterality: N/A;  .  CORONARY ARTERY BYPASS GRAFT    . CORONARY STENT PLACEMENT  08/1996    FAMILY HISTORY :   Family History  Problem Relation Age of Onset  . Stroke Mother        Low BP stroke  . Depression Father   . Depression Sister        Breast  . Heart disease Other   . Breast cancer Other   . Lung cancer Other   . Ovarian cancer Other   . Stomach cancer Other     SOCIAL HISTORY:   Social History   Tobacco Use  . Smoking status: Current Every Day Smoker    Packs/day: 0.75    Years: 47.00    Pack years: 35.25    Types: Cigarettes  . Smokeless tobacco: Never Used  Vaping Use  . Vaping Use: Never used  Substance Use Topics  . Alcohol use: Not Currently  . Drug use: No    ALLERGIES:  has No Known Allergies.  MEDICATIONS:  Current Outpatient Medications  Medication Sig Dispense Refill  . acetaminophen (TYLENOL) 500 MG tablet Take 1,000 mg by mouth every 6 (six) hours as needed for moderate pain.    [name] Kitchen amLODipine (NORVASC) 10 MG tablet Take 1 tablet (10 mg total) by mouth daily. 90 tablet 1  . aspirin EC 81 MG tablet Take 81 mg by mouth daily.    . carvedilol (COREG) 12.5 MG tablet Take 1.5 tablets (18.75 mg total) by mouth 2 (two) times daily. 270 tablet 3  . Cholecalciferol (VITAMIN D-1000 MAX ST) 25 MCG (1000 UT) tablet Take 1,000 Units by mouth daily.     . citalopram (CELEXA) 40 MG tablet TAKE 1 TABLET EVERY DAY (Patient taking differently: Take 40 mg by mouth daily.) 90 tablet 1  . epoetin alfa-epbx (RETACRIT) 62376 UNIT/ML injection Inject into the skin.    . ferrous sulfate 325 (65 FE) MG tablet Take 1 tablet (325 mg total) by mouth daily with breakfast. 30 tablet 1  . furosemide (LASIX) 20 MG tablet Take 1 tablet (20 mg total) by mouth daily. 90 tablet 1  . gentamicin cream (GARAMYCIN) 0.1 % Apply 1 application topically every other day.    . losartan (COZAAR) 50 MG tablet Take 1 tablet (50 mg total) by mouth daily. 90 tablet 1  . ondansetron (ZOFRAN ODT) 8 MG disintegrating  tablet Take 1 tablet (8 mg total) by mouth every 8 (eight) hours as needed for nausea or vomiting. 30 tablet 1  . predniSONE (DELTASONE) 10 MG tablet Take 10 mg by mouth daily with breakfast.    . traMADol (ULTRAM) 50 MG tablet Take 50 mg by mouth every 12 (twelve) hours as needed for moderate pain.     No current facility-administered medications for this visit.    PHYSICAL EXAMINATION: ECOG PERFORMANCE STATUS: 1 - Symptomatic but completely ambulatory  BP (!) 141/72 (BP Location: Left Arm, Patient Position: Sitting, Cuff Size: Normal)   Pulse 79   Temp 97.6 F (36.4 C) (Tympanic)   Resp 16   Ht '5\' 7"'  (1.702 m)   Wt 166 lb (75.3 kg)   SpO2 97%   BMI 26.00 kg/m   Filed Weights   10/29/20 1424  Weight: 166 lb (75.3 kg)    Physical Exam Constitutional:  Comments: He is alone.   HENT:     Head: Normocephalic and atraumatic.     Mouth/Throat:     Pharynx: No oropharyngeal exudate.  Eyes:     Pupils: Pupils are equal, round, and reactive to light.  Cardiovascular:     Rate and Rhythm: Normal rate and regular rhythm.  Pulmonary:     Effort: No respiratory distress.     Breath sounds: No wheezing.  Abdominal:     General: Bowel sounds are normal. There is no distension.     Palpations: Abdomen is soft. There is no mass.     Tenderness: There is no abdominal tenderness. There is no guarding or rebound.  Musculoskeletal:        General: No tenderness. Normal range of motion.     Cervical back: Normal range of motion and neck supple.  Skin:    General: Skin is warm.  Neurological:     Mental Status: He is alert and oriented to person, place, and time.  Psychiatric:        Mood and Affect: Affect normal.     LABORATORY DATA:  I have reviewed the data as listed    Component Value Date/Time   NA 137 10/29/2020 1356   NA 136 07/10/2020 0909   K 5.0 10/29/2020 1356   CL 105 10/29/2020 1356   CO2 19 (L) 10/29/2020 1356   GLUCOSE 107 (H) 10/29/2020 1356   BUN 82  (H) 10/29/2020 1356   BUN 45 (H) 07/10/2020 0909   CREATININE 7.14 (H) 10/29/2020 1356   CREATININE 1.12 09/30/2013 0953   CALCIUM 6.8 (L) 10/29/2020 1356   PROT 6.0 (L) 10/29/2020 1356   PROT 6.0 07/10/2020 0909   ALBUMIN 3.1 (L) 10/29/2020 1356   ALBUMIN 4.1 07/10/2020 0909   AST 12 (L) 10/29/2020 1356   ALT 13 10/29/2020 1356   ALKPHOS 41 10/29/2020 1356   BILITOT 0.5 10/29/2020 1356   BILITOT 0.4 07/10/2020 0909   GFRNONAA 8 (L) 10/29/2020 1356   GFRNONAA >60 09/30/2013 0953   GFRAA 14 (L) 07/10/2020 0909   GFRAA >60 09/30/2013 0953    No results found for: SPEP, UPEP  Lab Results  Component Value Date   WBC 6.6 10/29/2020   NEUTROABS 6.0 10/29/2020   HGB 9.3 (L) 10/29/2020   HCT 27.4 (L) 10/29/2020   MCV 88.4 10/29/2020   PLT 148 (L) 10/29/2020      Chemistry      Component Value Date/Time   NA 137 10/29/2020 1356   NA 136 07/10/2020 0909   K 5.0 10/29/2020 1356   CL 105 10/29/2020 1356   CO2 19 (L) 10/29/2020 1356   BUN 82 (H) 10/29/2020 1356   BUN 45 (H) 07/10/2020 0909   CREATININE 7.14 (H) 10/29/2020 1356   CREATININE 1.12 09/30/2013 0953      Component Value Date/Time   CALCIUM 6.8 (L) 10/29/2020 1356   ALKPHOS 41 10/29/2020 1356   AST 12 (L) 10/29/2020 1356   ALT 13 10/29/2020 1356   BILITOT 0.5 10/29/2020 1356   BILITOT 0.4 07/10/2020 0909       RADIOGRAPHIC STUDIES: I have personally reviewed the radiological images as listed and agreed with the findings in the report. No results found.   ASSESSMENT & PLAN:  Primary myelofibrosis ([place]) # Primary Myelofiborisis [Bone marrow 2010] jak-2 POSITIVE; currently on surveillance. OFF Jakafi because of intolerance.  Clinically worsening [fatigue; weight loss; dyspnea- see below]  # [place]  Cc-Baseline Feb 2019-  980cc;  July 3rd 2020-- 613; March 2021- 794; July 2021- 379; JAKAFI STOPPED/poor tolerance [Tapered off on July 8th 2021 ].FEB 2022-worsening splenomegaly/worsening myelofibrosis contributing  patient's symptoms.  Also patient symptoms likely secondary to poorly controlled COPD-see below.  We will plan to restart Jakafi renal dosing.  Discussed with pharmacy.  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  Hemoglobin 9.7; overall stable.  #COPD-poorly controlled; recommend PFTs; recommend continue prednisone 10 mg a day.  #CKD stage IV-V [Dr. Latif]  on PD; STABLE  # COVID vaccine: Patient is to unvaccinated to Covid-19. Wife/patient reluctant.  # DISPOSITION: # PFTs ASAP # follow up in 1 months-labs- MD-CBC/CMP;LDH;-Dr.B    Cc; Dr.[name] /Dr.Johnson   Orders Placed This Encounter  Procedures  . CBC with Differential/Platelet    Standing Status:   Future    Standing Expiration Date:   10/29/2021  . Comprehensive metabolic panel    Standing Status:   Future    Standing Expiration Date:   10/29/2021  . Lactate dehydrogenase    Standing Status:   Future    Standing Expiration Date:   10/29/2021  . Pulmonary Function Test [facility] Only    Standing Status:   Future    Standing Expiration Date:   10/29/2021    Order Specific Question:   Full PFT: includes the following: basic spirometry, spirometry pre & post bronchodilator, diffusion capacity (DLCO), lung volumes    Answer:   Full PFT   All questions were answered. The patient knows to call the clinic with any problems, questions or concerns.     Cammie Sickle, MD 10/29/2020 3:18 PM

## 2020-10-29 NOTE — Assessment & Plan Note (Addendum)
#   Primary Myelofiborisis [Bone marrow 2010] jak-2 POSITIVE; currently on surveillance. OFF Jakafi because of intolerance.  Clinically worsening [fatigue; weight loss; dyspnea- see below]  # [place]  Cc-Baseline Feb 2019- 980cc;  July 3rd 2020-- 613; March 2021- 794; July 2021- 379; JAKAFI STOPPED/poor tolerance [Tapered off on July 8th 2021 ].FEB 2022-worsening splenomegaly/worsening myelofibrosis contributing patient's symptoms.  Also patient symptoms likely secondary to poorly controlled COPD-see below.  We will plan to restart Jakafi renal dosing.  Discussed with pharmacy.  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  Hemoglobin 9.7; overall stable.  #COPD-poorly controlled; recommend PFTs; recommend continue prednisone 10 mg a day.  #CKD stage IV-V [Dr. Latif]  on PD; STABLE  # COVID vaccine: Patient is to unvaccinated to Covid-19. Wife/patient reluctant.  # DISPOSITION: # PFTs ASAP # follow up in 1 months-labs- MD-CBC/CMP;LDH;-Dr.B    Cc; Dr.[name] /Dr.Johnson

## 2020-10-29 NOTE — Progress Notes (Signed)
Was seen on 10/18/20 and was told to increase prednisone to 20 mg but it was not sent to pharmacy that way. He doubled up on 10 mg and now is about to run out. Does he need to stay on 20 mg or go back down to 10 mg of prednisone?

## 2020-10-30 ENCOUNTER — Encounter: Payer: Self-pay | Admitting: Family Medicine

## 2020-10-30 ENCOUNTER — Other Ambulatory Visit: Payer: Self-pay | Admitting: Family Medicine

## 2020-10-30 ENCOUNTER — Other Ambulatory Visit: Payer: Self-pay | Admitting: Internal Medicine

## 2020-10-30 DIAGNOSIS — N186 End stage renal disease: Secondary | ICD-10-CM | POA: Diagnosis not present

## 2020-10-30 DIAGNOSIS — Z992 Dependence on renal dialysis: Secondary | ICD-10-CM | POA: Diagnosis not present

## 2020-10-30 NOTE — Assessment & Plan Note (Signed)
Has been out of work due to fatigue and dialysis. Will fill out his paperwork for him. No other concerns.

## 2020-10-31 ENCOUNTER — Other Ambulatory Visit: Payer: Self-pay | Admitting: Internal Medicine

## 2020-10-31 ENCOUNTER — Inpatient Hospital Stay: Admission: RE | Admit: 2020-10-31 | Payer: Medicare HMO | Source: Ambulatory Visit

## 2020-10-31 ENCOUNTER — Telehealth: Payer: Self-pay | Admitting: Internal Medicine

## 2020-10-31 DIAGNOSIS — M12812 Other specific arthropathies, not elsewhere classified, left shoulder: Secondary | ICD-10-CM | POA: Diagnosis not present

## 2020-10-31 DIAGNOSIS — N186 End stage renal disease: Secondary | ICD-10-CM | POA: Diagnosis not present

## 2020-10-31 DIAGNOSIS — Z992 Dependence on renal dialysis: Secondary | ICD-10-CM | POA: Diagnosis not present

## 2020-10-31 MED ORDER — RUXOLITINIB PHOSPHATE 15 MG PO TABS: 15.0000 mg | ORAL_TABLET | Freq: Every day | ORAL | 4 refills | Status: DC

## 2020-10-31 NOTE — Telephone Encounter (Signed)
Dr. Jacinto ReapJuluis Arnold. Patient is having shoulder surgery. He does not want to start on the jakafi until after his surgery. He declines the PFTs and additional covid testing at this time.

## 2020-10-31 NOTE — Progress Notes (Signed)
Take Jakafi 15 mg once a day post PD.  GB

## 2020-10-31 NOTE — Telephone Encounter (Signed)
Called pt back and said that no one asked her about time they are available to do things due to dialysis. " we are not going to do PFT till after the surgery he can only do one thing at a time". He will also not start chemo meds till after surgery on shoulder 2/21. Per wife she will see [place] at next appt 3/7 to discuss all this.

## 2020-11-01 ENCOUNTER — Telehealth: Payer: Self-pay | Admitting: Internal Medicine

## 2020-11-01 ENCOUNTER — Other Ambulatory Visit: Payer: Medicare HMO

## 2020-11-01 ENCOUNTER — Ambulatory Visit: Payer: Medicare HMO | Attending: Internal Medicine

## 2020-11-01 DIAGNOSIS — Z992 Dependence on renal dialysis: Secondary | ICD-10-CM | POA: Diagnosis not present

## 2020-11-01 DIAGNOSIS — N186 End stage renal disease: Secondary | ICD-10-CM | POA: Diagnosis not present

## 2020-11-01 NOTE — Telephone Encounter (Signed)
On 02/09-I called patient's wife regarding patient's his since to get started on Jakafi/PFTs.  Patient's wife states that he will plan to get started on Jakafi after the shoulder surgery planned on the 19th.  Also, consider PFT after surgery.

## 2020-11-02 ENCOUNTER — Telehealth: Payer: Self-pay | Admitting: Pharmacist

## 2020-11-02 DIAGNOSIS — D471 Chronic myeloproliferative disease: Secondary | ICD-10-CM

## 2020-11-02 DIAGNOSIS — Z992 Dependence on renal dialysis: Secondary | ICD-10-CM | POA: Diagnosis not present

## 2020-11-02 DIAGNOSIS — N186 End stage renal disease: Secondary | ICD-10-CM | POA: Diagnosis not present

## 2020-11-02 NOTE — Telephone Encounter (Signed)
Oral Oncology Pharmacist Encounter  Received new prescription for Jakafi (ruxolitinib) for the treatment of Jak-2+ primary myelofibrosis, planned duration until disease progression or unacceptable drug toxicity.  Labs from 10/29/2020 assessed, okay for initiating treatment. Prescription dose and frequency assessed.   Current medication list in Epic reviewed, no DDIs with Jakafi identified.  Evaluated chart and no patient barriers to medication adherence identified.   Prescription has been e-scribed to the [facility] for benefits analysis and approval.  Oral Oncology Clinic will continue to follow for insurance authorization, copayment issues, initial counseling and start date.  Eddie Candle, PharmD, BCPS PGY2 Hematology/Oncology Pharmacy Resident Oral Chemotherapy Navigation Clinic 11/02/2020 4:02 PM

## 2020-11-04 DIAGNOSIS — Z992 Dependence on renal dialysis: Secondary | ICD-10-CM | POA: Diagnosis not present

## 2020-11-04 DIAGNOSIS — N186 End stage renal disease: Secondary | ICD-10-CM | POA: Diagnosis not present

## 2020-11-05 ENCOUNTER — Telehealth: Payer: Self-pay | Admitting: Pharmacy Technician

## 2020-11-05 ENCOUNTER — Other Ambulatory Visit: Payer: Self-pay | Admitting: Internal Medicine

## 2020-11-05 ENCOUNTER — Encounter
Admission: RE | Admit: 2020-11-05 | Discharge: 2020-11-05 | Disposition: A | Discharge status: Home / Self Care | Payer: Medicare HMO | Source: Ambulatory Visit | Attending: Orthopedic Surgery | Admitting: Orthopedic Surgery

## 2020-11-05 ENCOUNTER — Other Ambulatory Visit: Payer: Self-pay

## 2020-11-05 DIAGNOSIS — N186 End stage renal disease: Secondary | ICD-10-CM | POA: Diagnosis not present

## 2020-11-05 DIAGNOSIS — Z01812 Encounter for preprocedural laboratory examination: Secondary | ICD-10-CM | POA: Insufficient documentation

## 2020-11-05 DIAGNOSIS — Z992 Dependence on renal dialysis: Secondary | ICD-10-CM | POA: Diagnosis not present

## 2020-11-05 HISTORY — DX: Other complications of anesthesia, initial encounter: T88.59XA

## 2020-11-05 HISTORY — DX: Chronic obstructive pulmonary disease, unspecified: J44.9

## 2020-11-05 HISTORY — DX: Other fatigue: R53.83

## 2020-11-05 HISTORY — DX: Malignant (primary) neoplasm, unspecified: C80.1

## 2020-11-05 MED ORDER — RUXOLITINIB PHOSPHATE 15 MG PO TABS
15.0000 mg | ORAL_TABLET | Freq: Every day | ORAL | 4 refills | Status: DC
Start: 2020-11-05 — End: 2020-11-24

## 2020-11-05 NOTE — Pre-Procedure Instructions (Signed)
Spoke with wife of patient, Jackelyn Poling, to obtain medical history and medication information.  She states that she does all his talking with medical as he doesn't remember/understand most of what he takes.  Patient has been on the kidney transplant list in the past, but does not qualify d/t his smoking and his myelofibrosis.

## 2020-11-05 NOTE — Telephone Encounter (Signed)
Oral Oncology Patient Advocate Encounter   Was successful in securing patient an $31 grant from Patient [facility]) to provide copayment coverage for Jakafi.  This will keep the out of pocket expense at $0.     I have spoken with patient's wife, Jackelyn Poling.    The billing information is as follows and has been shared with [facility].   Member ID: [id number] Group ID: [id number] RxBin: [id number] Dates of Eligibility: 08/07/20 through 08/06/21  Fund:  [place] [facility]eloprolifer[facility] [place] Patient [place] Phone [phone] Fax [phone] 11/05/2020 2:37 PM

## 2020-11-05 NOTE — Patient Instructions (Signed)
INSTRUCTIONS FOR SURGERY     Your surgery is scheduled for:   Monday, February 28TH     To find out your arrival time for the day of surgery,          please call [phone] between 1 pm and 3 pm on :  Friday, February 25TH     When you arrive for surgery, report to the [facility].     ONCE THEY ARE FINISHED WITH THEIR PROCESS, YOU WILL GO TO THE SECOND       FLOOR AND SIGN IN AT THE SURGERY DESK.         REMEMBER: Instructions that are not followed completely may result in serious medical risk,  up to and including death, or upon the discretion of your surgeon and anesthesiologist,            your surgery may need to be rescheduled.  __X__ 1. Do not eat food after midnight the night before your procedure.                    No gum, candy, lozenger, tic tacs, tums or hard candies.                  ABSOLUTELY NOTHING SOLID IN YOUR MOUTH AFTER MIDNIGHT                    You may drink unlimited clear liquids up to 2 hours before you are scheduled to arrive for surgery.                   Do not drink anything within those 2 hours unless you need to take medicine, then take the                   smallest amount you need.  Clear liquids include:  water, apple juice without pulp,                   any flavor Gatorade, Black coffee, black tea.  Sugar may be added but no dairy/ honey /lemon.                        Broth and jello is not considered a clear liquid.  __x__  2. On the morning of surgery, please brush your teeth with toothpaste and water. You may rinse with                  mouthwash if you wish but DO NOT SWALLOW TOOTHPASTE OR MOUTHWASH  __X___3. NO alcohol for 24 hours before or after surgery.  __x___ 4.  Do NOT smoke or use e-cigarettes for 24 HOURS PRIOR TO SURGERY.                      DO NOTUse any chewable tobacco products for at least 6 hours prior to surgery.  __x___ 5. If you start any  new medication after this appointment and prior to surgery, please                   Bring  it with you on the day of surgery.  ___x__ 6. Notify your doctor if there is any change in your medical condition, such as fever, infection, vomitting,                   Diarrhea or any open sores.  __x___ 7.  USE the CHG SOAP as instructed, the night before surgery and the day of surgery.                   Once you have washed with this soap, do NOT use any of the following: Powders, perfumes                    or lotions. Please do not wear make up, hairpins, clips or nail polish. You MAY NOT wear deodorant.                   Men may shave their face and neck.                     DO NOT wear ANY jewelry on the day of surgery. If there are rings that are too tight to                    remove easily, please address this prior to the surgery day. Piercings need to be removed.                                                                     NO METAL ON YOUR BODY.                    Do NOT bring any valuables.  If you came to Pre-Admit testing then you will not need license,                     insurance card or credit card.  If you will be staying overnight, please either leave your things in                     the car or have your family be responsible for these items.                     [facility] IS NOT RESPONSIBLE FOR BELONGINGS OR VALUABLES.  ___X__ 8. DO NOT wear contact lenses on surgery day.  You may not have dentures,                     Hearing aides, contacts or glasses in the operating room. These items can be                    Placed in the Recovery Room to receive immediately after surgery.  __x___ 9. IF YOU ARE SCHEDULED TO GO HOME ON THE SAME DAY, YOU MUST                   Have someone to drive you home and to stay with you  for the first 24 hours.                    Have an arrangement prior  to arriving on surgery day.  ___x__ 10. Take the following medications on the morning  of surgery with a sip of water:                              1. NORVASC                     2. COREG                     3. CELEXA                     4. PREDNISONE                     5. TRAMADOL, if you need it for pain                      6.  _X____ 11.  Follow any instructions provided to you by your surgeon.                        Such as enema, clear liquid bowel prep                   ## USE THE BENZOYL PEROXIDE GEL AS WRITTEN IN INSTRUCTIONS.                   ##PLEASE COMPLETE THE PRESURGICAL CARBOHYDRATE DRINK ON THE                     DAY OF SURGERY.  IT NEEDS TO BE FINISHED 2 HOURS PRIOR TO ARRIVAL                      TO [place].  __X__  12. STOP ALL ASPIRIN PRODUCTS AS OF ONE WEEK PRIOR TO SURGERY.                      STOP YOUR ASPIRIN ON February 21ST                       THIS INCLUDES BC POWDERS / GOODIES POWDER  __x___ 13. STOP Anti-inflammatories as of ONE WEEK PRIOR TO SURGERY.                       STOP THESE ON February 21ST.                      This includes IBUPROFEN / MOTRIN / ADVIL / ALEVE/ NAPROXYN                    YOU MAY TAKE TYLENOL ANY TIME PRIOR TO SURGERY.  _X___ 14. You may continue taking Vitamin B12 / Vitamin D3 but do not take on the morning of surgery.  __X____17.  Continue to take the following medications but do not take on the morning of surgery:                          LASIX // LOSARTAN // VITAMIN D // Wallace  __X__18. If staying overnight, please have appropriate shoes to wear to be able to walk around the unit.  Wear clean and comfortable clothing to the hospital.  PLEASE BRING A CELL PHONE AND CHARGER. HAVE A LARGE OR LOOSE FITTING SHIRT TO GO HOME IN AFTER SURGERY. HAVE STOOL SOFTENERS FOR USE AT HOME WHILE TAKING PAIN MEDICINE (narcotics).   YOU MAY BECOME CONSTIPATED WHILE USING THESE MEDICINES SO PLEASE PLAN AHEAD.

## 2020-11-06 ENCOUNTER — Telehealth: Payer: Self-pay

## 2020-11-06 DIAGNOSIS — N186 End stage renal disease: Secondary | ICD-10-CM | POA: Diagnosis not present

## 2020-11-06 DIAGNOSIS — Z992 Dependence on renal dialysis: Secondary | ICD-10-CM | POA: Diagnosis not present

## 2020-11-06 NOTE — Telephone Encounter (Signed)
appt

## 2020-11-06 NOTE — Telephone Encounter (Signed)
FYI pt is scheduled for tomorrow 2/16  Copied from [place] #[id number]. Topic: Quick Communication - See Telephone Encounter >> Nov 06, 2020 11:24 AM Loma Boston wrote: CRM for notification. See Telephone encounter for: 11/06/20.pt wife called and states that her husband James Arnold, James Arnold) is having shoulder surgery on Feb 28th. They have been informed by [facility], DR Patel's office that a plan of action for pt to stop smoking must be in place, ie medications from PCP or that surgury could not take place. Elmyra Ricks at [facility] with ins, reached out to them with this information. Please Fu with Elmyra Ricks at [phone] with "plan" and notify pt of "plan" so that this surgury will go forward as pt is in a lot of pain. Jackelyn Poling can be contacted at [phone] re any info or details of what they need to do. Restates that this has to be done in short term as to meet the requirement of time frame of sch surgury, Pl advise pt or pt wife preferably.

## 2020-11-07 ENCOUNTER — Ambulatory Visit (INDEPENDENT_AMBULATORY_CARE_PROVIDER_SITE_OTHER): Payer: Medicare HMO | Admitting: Family Medicine

## 2020-11-07 ENCOUNTER — Other Ambulatory Visit: Payer: Self-pay

## 2020-11-07 ENCOUNTER — Telehealth: Payer: Self-pay

## 2020-11-07 ENCOUNTER — Encounter: Payer: Self-pay | Admitting: Family Medicine

## 2020-11-07 VITALS — BP 134/82 | HR 74 | Temp 97.6°F | Wt 166.0 lb

## 2020-11-07 DIAGNOSIS — N186 End stage renal disease: Secondary | ICD-10-CM | POA: Diagnosis not present

## 2020-11-07 DIAGNOSIS — Z720 Tobacco use: Secondary | ICD-10-CM | POA: Diagnosis not present

## 2020-11-07 DIAGNOSIS — Z992 Dependence on renal dialysis: Secondary | ICD-10-CM | POA: Diagnosis not present

## 2020-11-07 HISTORY — DX: Tobacco use: Z72.0

## 2020-11-07 MED ORDER — CHANTIX STARTING MONTH PAK 0.5 MG X 11 & 1 MG X 42 PO TABS: ORAL_TABLET | ORAL | 0 refills | Status: DC

## 2020-11-07 MED ORDER — VARENICLINE TARTRATE 1 MG PO TABS: 1.0000 mg | ORAL_TABLET | Freq: Two times a day (BID) | ORAL | 2 refills | Status: DC

## 2020-11-07 MED ORDER — NICOTINE 14 MG/24HR TD PT24: 14.0000 mg | MEDICATED_PATCH | Freq: Every day | TRANSDERMAL | 0 refills | Status: DC

## 2020-11-07 NOTE — Progress Notes (Signed)
BP 134/82   Pulse 74   Temp 97.6 F (36.4 C) (Oral)   Wt 166 lb (75.3 kg)   SpO2 96%   BMI 26.00 kg/m    Subjective:    [name], male    DOB: January 13, 1956, 65 y.o.   MRN: [id number]  HPI: James Arnold is a 65 y.o. male  Chief Complaint  Patient presents with  . Nicotine Dependence    Pt states he is doing well and has cut his smoking a lot and then he would like to discuss that with provider and questions regarding surgery.   SMOKING CESSATION Smoking Status: every day smoker Smoking Amount: 3/4 ppd- down from 2ppd Smoking Onset: 12/13 Smoking Quit Date: not set Smoking triggers: first cup of coffee, after he eats Type of tobacco use: cigarettes Children in the house: no Other household members who smoke: no Treatments attempted: cold [place] Pneumovax: up to date  Relevant past medical, surgical, family and social history reviewed and updated as indicated. Interim medical history since our last visit reviewed. Allergies and medications reviewed and updated.  Review of Systems  Constitutional: Negative.   Respiratory: Negative.   Cardiovascular: Negative.   Gastrointestinal: Negative.   Musculoskeletal: Positive for arthralgias. Negative for back pain, gait problem, joint swelling, myalgias, neck pain and neck stiffness.  Skin: Negative.   Neurological: Negative.   Psychiatric/Behavioral: Negative.     Per HPI unless specifically indicated above     Objective:    BP 134/82   Pulse 74   Temp 97.6 F (36.4 C) (Oral)   Wt 166 lb (75.3 kg)   SpO2 96%   BMI 26.00 kg/m   Wt Readings from Last 3 Encounters:  11/07/20 166 lb (75.3 kg)  11/05/20 161 lb (73 kg)  10/29/20 166 lb (75.3 kg)    Physical Exam Vitals and nursing note reviewed.  Constitutional:      General: He is not in acute distress.    Appearance: Normal appearance. He is not ill-appearing, toxic-appearing or diaphoretic.  HENT:     Head: Normocephalic and atraumatic.      Right Ear: External ear normal.     Left Ear: External ear normal.     Nose: Nose normal.     Mouth/Throat:     Mouth: Mucous membranes are moist.     Pharynx: Oropharynx is clear.  Eyes:     General: No scleral icterus.       Right eye: No discharge.        Left eye: No discharge.     Extraocular Movements: Extraocular movements intact.     Conjunctiva/sclera: Conjunctivae normal.     Pupils: Pupils are equal, round, and reactive to light.  Cardiovascular:     Rate and Rhythm: Normal rate and regular rhythm.     Pulses: Normal pulses.     Heart sounds: Normal heart sounds. No murmur heard. No friction rub. No gallop.   Pulmonary:     Effort: Pulmonary effort is normal. No respiratory distress.     Breath sounds: Normal breath sounds. No stridor. No wheezing, rhonchi or rales.  Chest:     Chest wall: No tenderness.  Musculoskeletal:        General: Normal range of motion.     Cervical back: Normal range of motion and neck supple.  Skin:    General: Skin is warm and dry.     Capillary Refill: Capillary refill takes less than 2 seconds.  Coloration: Skin is not jaundiced or pale.     Findings: No bruising, erythema, lesion or rash.  Neurological:     General: No focal deficit present.     Mental Status: He is alert and oriented to person, place, and time. Mental status is at baseline.  Psychiatric:        Mood and Affect: Mood normal.        Behavior: Behavior normal.        Thought Content: Thought content normal.        Judgment: Judgment normal.     Results for orders placed or performed in visit on 10/29/20  Lactate dehydrogenase  Result Value Ref Range   LDH 606 (H) 98 - 192 U/L  Comprehensive metabolic panel  Result Value Ref Range   Sodium 137 135 - 145 mmol/L   Potassium 5.0 3.5 - 5.1 mmol/L   Chloride 105 98 - 111 mmol/L   CO2 19 (L) 22 - 32 mmol/L   Glucose, Bld 107 (H) 70 - 99 mg/dL   BUN 82 (H) 8 - 23 mg/dL   Creatinine, Ser 7.14 (H) 0.61 - 1.24  mg/dL   Calcium 6.8 (L) 8.9 - 10.3 mg/dL   Total Protein 6.0 (L) 6.5 - 8.1 g/dL   Albumin 3.1 (L) 3.5 - 5.0 g/dL   AST 12 (L) 15 - 41 U/L   ALT 13 0 - 44 U/L   Alkaline Phosphatase 41 38 - 126 U/L   Total Bilirubin 0.5 0.3 - 1.2 mg/dL   GFR, Estimated 8 (L) >60 mL/min   Anion gap 13 5 - 15  CBC with Differential  Result Value Ref Range   WBC 6.6 4.0 - 10.5 K/uL   RBC 3.10 (L) 4.22 - 5.81 MIL/uL   Hemoglobin 9.3 (L) 13.0 - 17.0 g/dL   HCT 27.4 (L) 39.0 - 52.0 %   MCV 88.4 80.0 - 100.0 fL   MCH 30.0 26.0 - 34.0 pg   MCHC 33.9 30.0 - 36.0 g/dL   RDW 15.5 11.5 - 15.5 %   Platelets 148 (L) 150 - 400 K/uL   nRBC 0.0 0.0 - 0.2 %   Neutrophils Relative % 89 %   Neutro Abs 6.0 1.7 - 7.7 K/uL   Lymphocytes Relative 6 %   Lymphs Abs 0.4 (L) 0.7 - 4.0 K/uL   Monocytes Relative 3 %   Monocytes Absolute 0.2 0.1 - 1.0 K/uL   Eosinophils Relative 0 %   Eosinophils Absolute 0.0 0.0 - 0.5 K/uL   Basophils Relative 0 %   Basophils Absolute 0.0 0.0 - 0.1 K/uL   Immature Granulocytes 2 %   Abs Immature Granulocytes 0.10 (H) 0.00 - 0.07 K/uL      Assessment & Plan:   Problem List Items Addressed This Visit      Other   Tobacco abuse - Primary    Would like to start chantix and nicoderm and recheck 1 month. Call with any concerns. Continue to monitor.           Follow up plan: Return in about 4 weeks (around 12/05/2020).

## 2020-11-07 NOTE — Telephone Encounter (Signed)
Patient visit notes was faxed over to [place] office at [phone].

## 2020-11-07 NOTE — Telephone Encounter (Signed)
-----   Message from Valerie Roys, DO sent at 11/07/2020 11:38 AM EST ----- Please send note from today to Dr. Serita Grit office [facility] ortho)

## 2020-11-07 NOTE — Assessment & Plan Note (Signed)
Would like to start chantix and nicoderm and recheck 1 month. Call with any concerns. Continue to monitor.

## 2020-11-08 DIAGNOSIS — N186 End stage renal disease: Secondary | ICD-10-CM | POA: Diagnosis not present

## 2020-11-08 DIAGNOSIS — Z992 Dependence on renal dialysis: Secondary | ICD-10-CM | POA: Diagnosis not present

## 2020-11-09 ENCOUNTER — Telehealth: Payer: Self-pay | Admitting: Pharmacist

## 2020-11-09 ENCOUNTER — Other Ambulatory Visit: Payer: Medicare HMO

## 2020-11-09 DIAGNOSIS — N186 End stage renal disease: Secondary | ICD-10-CM | POA: Diagnosis not present

## 2020-11-09 DIAGNOSIS — Z992 Dependence on renal dialysis: Secondary | ICD-10-CM | POA: Diagnosis not present

## 2020-11-09 NOTE — Telephone Encounter (Signed)
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   Patient's wife reported that her husband's surgery was moved to 11/19/2020 and that his vascular surgeon would like to hold resuming Jakafi until 6 weeks after surgery (~12/31/20). Patients wife will contact the clinic when patient resumes Jakafi.   The patient's wife did question if patient should also be re-starting acyclovir for VZV prophylaxis since he was previously on it while taking Jakafi, but the prescription has since been cancelled. Will defer to Dr. Rogue Bussing, but would recommend re-initiating acyclovir at previous renally adjusted dose of 200 mg BID.  Lastly, the patients wife expressed some hesitancy with patient restarting Jakafi due to a general distrust of drug manufacturers and the government.   Pt is doing well. He was previously on Jakafi but discontinued due to intolerance. Patient's wife didn't feel they required re-education.  Patient will take Jakafi 15 mg tablet once daily in the morning, after PD cycle is completed.  Reviewed with patient importance of keeping a medication schedule and plan for any missed doses.  After discussion with patient's wife, no patient barriers to medication adherence identified.   Patient's wife voiced understanding and appreciation. All questions answered. Medication handout provided.  Provided patient with [facility] phone number. Patient knows to call the office with questions or concerns. [facility] will continue to follow.   Eddie Candle, PharmD, BCPS PGY2 Hematology/Oncology Pharmacy Resident [facility] 11/09/2020 10:51 AM

## 2020-11-11 DIAGNOSIS — Z992 Dependence on renal dialysis: Secondary | ICD-10-CM | POA: Diagnosis not present

## 2020-11-11 DIAGNOSIS — N186 End stage renal disease: Secondary | ICD-10-CM | POA: Diagnosis not present

## 2020-11-12 DIAGNOSIS — N186 End stage renal disease: Secondary | ICD-10-CM | POA: Diagnosis not present

## 2020-11-12 DIAGNOSIS — Z992 Dependence on renal dialysis: Secondary | ICD-10-CM | POA: Diagnosis not present

## 2020-11-13 DIAGNOSIS — Z992 Dependence on renal dialysis: Secondary | ICD-10-CM | POA: Diagnosis not present

## 2020-11-13 DIAGNOSIS — N186 End stage renal disease: Secondary | ICD-10-CM | POA: Diagnosis not present

## 2020-11-13 MED FILL — JAKAFI 15 MG TABLET: 15 | 30 days supply | Qty: 30 | Fill #0

## 2020-11-14 DIAGNOSIS — N186 End stage renal disease: Secondary | ICD-10-CM | POA: Diagnosis not present

## 2020-11-14 DIAGNOSIS — Z992 Dependence on renal dialysis: Secondary | ICD-10-CM | POA: Diagnosis not present

## 2020-11-15 ENCOUNTER — Encounter: Payer: Self-pay | Admitting: Orthopedic Surgery

## 2020-11-15 ENCOUNTER — Other Ambulatory Visit: Payer: Self-pay

## 2020-11-15 ENCOUNTER — Encounter
Admission: RE | Admit: 2020-11-15 | Discharge: 2020-11-15 | Disposition: A | Discharge status: Home / Self Care | Payer: Medicare HMO | Source: Ambulatory Visit | Attending: Orthopedic Surgery | Admitting: Orthopedic Surgery

## 2020-11-15 DIAGNOSIS — N186 End stage renal disease: Secondary | ICD-10-CM | POA: Diagnosis not present

## 2020-11-15 DIAGNOSIS — Z01812 Encounter for preprocedural laboratory examination: Secondary | ICD-10-CM | POA: Insufficient documentation

## 2020-11-15 DIAGNOSIS — Z992 Dependence on renal dialysis: Secondary | ICD-10-CM | POA: Diagnosis not present

## 2020-11-15 DIAGNOSIS — Z20822 Contact with and (suspected) exposure to covid-19: Secondary | ICD-10-CM | POA: Insufficient documentation

## 2020-11-15 LAB — TYPE AND SCREEN
ABO/RH(D): A POS
Antibody Screen: NEGATIVE

## 2020-11-15 LAB — BASIC METABOLIC PANEL
Anion gap: 10 (ref 5–15)
BUN: 65 mg/dL — ABNORMAL HIGH (ref 8–23)
CO2: 22 mmol/L (ref 22–32)
Calcium: 8.1 mg/dL — ABNORMAL LOW (ref 8.9–10.3)
Chloride: 103 mmol/L (ref 98–111)
Creatinine, Ser: 6.53 mg/dL — ABNORMAL HIGH (ref 0.61–1.24)
GFR, Estimated: 9 mL/min — ABNORMAL LOW (ref >60)
Glucose, Bld: 82 mg/dL (ref 70–99)
Potassium: 5.2 mmol/L — ABNORMAL HIGH (ref 3.5–5.1)
Sodium: 135 mmol/L (ref 135–145)

## 2020-11-15 LAB — SURGICAL PCR SCREEN
MRSA, PCR: NEGATIVE
Staphylococcus aureus: POSITIVE — AB

## 2020-11-15 LAB — CBC
HCT: 29.3 % — ABNORMAL LOW (ref 39.0–52.0)
Hemoglobin: 9.8 g/dL — ABNORMAL LOW (ref 13.0–17.0)
MCH: 30.1 pg (ref 26.0–34.0)
MCHC: 33.4 g/dL (ref 30.0–36.0)
MCV: 89.9 fL (ref 80.0–100.0)
Platelets: 140 10*3/uL — ABNORMAL LOW (ref 150–400)
RBC: 3.26 MIL/uL — ABNORMAL LOW (ref 4.22–5.81)
RDW: 15.5 % (ref 11.5–15.5)
WBC: 7.4 10*3/uL (ref 4.0–10.5)
nRBC: 0 % (ref 0.0–0.2)

## 2020-11-15 LAB — APTT: aPTT: 28 seconds (ref 24–36)

## 2020-11-15 LAB — SARS CORONAVIRUS 2 (TAT 6-24 HRS): SARS Coronavirus 2: NEGATIVE

## 2020-11-15 LAB — PROTIME-INR
INR: 1.1 (ref 0.8–1.2)
Prothrombin Time: 13.9 seconds (ref 11.4–15.2)

## 2020-11-15 NOTE — Progress Notes (Signed)
Perioperative Services  Pre-Admission/Anesthesia Testing Clinical Review  Date: 11/16/20  Patient Demographics:  Name: James Arnold DOB:   [birth year] MRN:   [id number]  Planned Surgical Procedure(s):    Case: [id number] Date/Time: 11/19/20 0715   Procedure: Left reverse shoulder arthroplasty and biceps tenodesis - Reche Dixon to Assist (Left Shoulder)   Anesthesia type: Choice   Pre-op diagnosis: Rotator cuff arthropathy, left M12.812   Location: [facility] OR ROOM 08 / [place]S FOR ANESTHESIA GROUP   Surgeons: Leim Fabry, MD     NOTE: Available PAT nursing documentation and vital signs have been reviewed. Clinical nursing staff has updated patient's PMH/PSHx, current medication list, and drug allergies/intolerances to ensure comprehensive history available to assist in medical decision making as it pertains to the aforementioned surgical procedure and anticipated anesthetic course.   Clinical Discussion:  James Arnold is a 64 y.o. male who is submitted for pre-surgical anesthesia review and clearance prior to him undergoing the above procedure. Patient is a Current Smoker (35.25 pack years). Pertinent PMH includes: CAD, MI, diastolic dysfunction, HTN, HLD, DOE, CKD-V, FSGS, myelofibrosis, anemia, thrombocytopenia, depression.  Patient is followed by cardiology (End, MD). He was last seen in the cardiology clinic on 09/10/2020; notes reviewed.  At the time of his clinic visit, patient doing fairly well from a cardiovascular standpoint.  He denied any episodes of recurrent chest pain, pressure, or tightness.  Patient feeling more short of breath than normal that was felt to be related to underlying decreased lung function; CT in 12/2019 showed emphysema that patient was unaware of.  PMH significant for cardiovascular diagnoses, including MI.  Patient underwent diagnostic left heart catheterization and 08/1996 showed a 95% stenosis of the mid LAD; BMS x 1 was placed.  Following an admission for chest  pain in 2019, patient underwent normal noninvasive cardiovascular studies.  Patient was ultimately lost to follow-up following visit in 01/2018 until 07/2020 following a 3-day history of shortness of breath and chest pressure.  Symptoms felt to be related to an AECOPD.  During patient's last clinic visit, patient reported significant exertional dyspnea and chest "pounding".  Patient was started on nitrate therapy and repeat cardiovascular testing was ordered.  Myocardial perfusion imaging study performed on 08/21/2020 revealed no evidence of stress-induced myocardial ischemia or arrhythmia; LVEF was 51%.  Subsequent TTE performed on 09/07/2020 demonstrated normal left ventricular systolic function with an EF of 60-65%; GLS was -15.8% (see full interpretation of cardiovascular testing below). Patient on GDMT for his HTN and HLD diagnoses.  Blood pressure elevated at 152/70 on currently prescribed CCB, beta-blocker, and diuretic therapies.  Patient is not on pharmacological risk factor modification for his HLD due to refusal.  Beta-blocker dose increased due to elevated blood pressure.  No other changes were made to patient's medication regimen.  Patient to follow-up with outpatient cardiology in 6 months or sooner if needed.  Patient also being followed by oncology Rogue Bussing, MD) for JAK2 (+) myelofibrosis.  He was last seen in the hematology/oncology clinic on 10/29/2020.  Patient with worsening myelofibrosis symptoms (fatigue, weight loss, SOB).  Patient previously on Jakafi, however tapered off back and 03/2020 due to tolerance issues.  Last splenic imaging revealed splenic volume of 634.  Patient with worsening COPD symptoms.  Also being treated for anemia of chronic disease with intermittent iron infusions and erythropoietin injections.  Patient chronically on systemic corticosteroid therapy.  PFTs recommended and scheduled.  Oncologist recommending restarting Jakafi with renal dosing per pharmacy to help  control  symptoms and progression of disease.  Patient opting to defer reinitiation of therapy until after upcoming elective orthopedic procedure.  Patient is scheduled to undergo an elective orthopedic procedure on 11/19/2020 with Dr. Leim Fabry.  Given patient's past medical history significant for cardiovascular disease and intervention, presurgical clearances were sought by the PAT team.  Specialty clearances obtained as follows:    Patient seen by new primary care provider today (11/16/2020) for preoperative clearance. Per internal medicine (Vigg, MD), "patient is HIGH risk for planned procedure. Patient needs nephrology clearance. He also has non-specific ST-T wave changes on EKG and needs cardiac clearance as well". Message sent to provider to discuss that these clearances have already been obtained. Discussed that patient would receive regional block + general anesthesia for the procedure. MD made aware that cardiology had also been asked to review today's ECG for changes. Cardiology provider also reached out to MD to discuss review. Following collaborative conversations between PCP, cardiology, and PAT providers, Dr. Neomia Dear asking that all of the discussed information be documented. Clearance to proceed with the planned surgical/anesthetic course was issued from internal medicine provider.    Per hematology/oncology Rogue Bussing, MD), "patient is cleared to proceed with surgery from a hematology standpoint".    Per nephrology Holley Raring, MD), "patient is optimized and okay to proceed with planned procedure from a renal perspective with an overall LOW risk of complications".   Patient previously cleared by cardiology Marilynn Rail, NP-C) on 10/15/2020 with an acceptable risk. Patient seen by new primary provider today Neomia Dear, MD) who requested patient see cardiology secondary to non-specific ST-T wave changes on ECG performed in office today. Patient re-submitted to cardiology pool for provider review. Per  Eulas Post, PA-C, "today's EKG was reviewed and is unchanged when compared to the previous EKG. Given past medical history and time since last visit, based on patient's past medical history and time since his last clinic visit, patient would be at an overall ACCEPTABLE risk for the planned procedure without further cardiovascular testing or intervention at this time".  This patient is on daily antiplatelet therapy.  He has been instructed on recommendations from his cardiologist and orthopedic surgeon for holding his daily low-dose ASA for 7 days prior to his procedure.  Patient is aware that his last dose of ASA will be on 11/12/2020.  Patient denies previous perioperative complications with anesthesia in the past. In review of the available records, it is noted that patient underwent a MAC anesthetic course at [facility] (ASA III) in 05/2019 without documented complications.   Vitals with BMI 11/16/2020 11/07/2020 11/05/2020  Height 5' 6.339" - _0   Weight 163 lbs 166 lbs 161 lbs  BMI 26.04 02.40 97.35  Systolic - 329 -  Diastolic - 82 -  Pulse - 74 -    Providers/Specialists:   NOTE: Primary physician provider listed below. Patient may have been seen by APP or partner within same practice.   PROVIDER ROLE / SPECIALTY LAST Rossie Muskrat, MD Orthopedics (Surgeon)  10/31/2018  Charlynne Cousins, MD Primary Care Provider  11/16/2020  End, Harrell Gave, MD Cardiology  09/10/2020  Anthonette Legato, MD Nephrology  08/23/2020  Charlaine Dalton, MD Hematology/Oncology  10/29/2020   Allergies:  Patient has no known allergies.  Current Home Medications:   No current facility-administered medications for this encounter.   [name] Kitchen acetaminophen (TYLENOL) 500 MG tablet  . amLODipine (NORVASC) 10 MG tablet  . aspirin EC 81 MG tablet  . carvedilol (COREG) 12.5 MG tablet  . Cholecalciferol (VITAMIN D-1000  MAX ST) 25 MCG (1000 UT) tablet  . citalopram (CELEXA) 40 MG tablet  . ferrous sulfate 325 (65 FE) MG tablet   . furosemide (LASIX) 20 MG tablet  . gentamicin cream (GARAMYCIN) 0.1 %  . losartan (COZAAR) 50 MG tablet  . predniSONE (DELTASONE) 10 MG tablet  . traMADol (ULTRAM) 50 MG tablet  . epoetin alfa-epbx (RETACRIT) 78295 UNIT/ML injection  . nicotine (NICODERM CQ) 14 mg/24hr patch  . ondansetron (ZOFRAN ODT) 8 MG disintegrating tablet  . ruxolitinib phosphate (JAKAFI) 15 MG tablet  . varenicline (CHANTIX CONTINUING MONTH PAK) 1 MG tablet  . varenicline (CHANTIX STARTING MONTH PAK) 0.5 MG X 11 & 1 MG X 42 tablet   History:   Past Medical History:  Diagnosis Date  . Anemia   . Benign hypertensive renal disease   . CAD (coronary artery disease)    a. 08/1996 s/p BMS to the mLAD ([facility]); b. 12/2017 MV: EF 46%, fixed apical defect w/ significant GI uptake artifact. No ischemia-> low risk.  . CKD (chronic kidney disease), stage V ([place])    on peritoneal dialysis  . Complication of anesthesia    please call by "[name]" when waking up!!  . COPD (chronic obstructive pulmonary disease) ([place])    centrilobular emphysema. pt is poorly controlled with his copd/smoking.  . Depression   . Diastolic dysfunction    a. 12/2017 Echo: EF 60-65%, no rwma, Gr1 DD, mild AI/MR. Nl RVSP.  [name] Kitchen Dyspnea    with exertion  . Fatigue   . FSGS (focal segmental glomerulosclerosis)   . Hyperlipidemia 10/2002  . Hypertension   . myelofibrosis    bone marrow failure  . Myelofibrosis ([place]) 2010   JAK2 (+) primary  . Myocardial infarction ([place]) 1997   stent x 1  . Smoker   . Thrombocytopenia ([place])    Past Surgical History:  Procedure Laterality Date  . ANGIOPLASTY    . BONE MARROW ASPIRATION  03/2009  . CARDIAC CATHETERIZATION  1997   stent x 1  . COLONOSCOPY WITH PROPOFOL N/A 08/09/2018   Procedure: COLONOSCOPY WITH PROPOFOL;  Surgeon: [name], MD;  Location: [facility] ENDOSCOPY;  Service: Gastroenterology;  Laterality: N/A;  . CORONARY STENT PLACEMENT  08/1996  . EXTERIORIZATION OF A CONTINUOUS AMBULATORY  PERITONEAL DIALYSIS CATHETER    . EYE SURGERY Bilateral    cats removed   Family History  Problem Relation Age of Onset  . Stroke Mother        Low BP stroke  . Depression Father   . Depression Sister        Breast  . Heart disease Other   . Breast cancer Other   . Lung cancer Other   . Ovarian cancer Other   . Stomach cancer Other    Social History   Tobacco Use  . Smoking status: Current Every Day Smoker    Packs/day: 0.75    Years: 47.00    Pack years: 35.25    Types: Cigarettes  . Smokeless tobacco: Never Used  . Tobacco comment: trying to cut back  Vaping Use  . Vaping Use: Never used  Substance Use Topics  . Alcohol use: Not Currently    Comment: rare  . Drug use: No    Pertinent Clinical Results:  LABS: Labs reviewed: Acceptable for surgery.  Hospital Outpatient Visit on 11/15/2020  Component Date Value Ref Range Status  . aPTT 11/15/2020 28  24 - 36 seconds Final   Performed at [facility],  [address], [place], [place] [address]  . MRSA, PCR 11/15/2020 NEGATIVE  NEGATIVE Final  . Staphylococcus aureus 11/15/2020 POSITIVE* NEGATIVE Final   Comment: RESULT CALLED TO, READ BACK BY AND VERIFIED WITH: REED RENO RN AT 1452 ON 11/15/20 SNG (NOTE) The Xpert SA Assay (FDA approved for NASAL specimens in patients 58 years of age and older), is one component of a comprehensive surveillance program. It is not intended to diagnose infection nor to guide or monitor treatment. Performed at [facility], [address], [place], [place] [address]   . ABO/RH(D) 11/15/2020 A POS   Final  . Antibody Screen 11/15/2020 NEG   Final  . Sample Expiration 11/15/2020 11/29/2020,2359   Final  . Extend sample reason 11/15/2020    Final                   Value:NO TRANSFUSIONS OR PREGNANCY IN THE PAST 3 MONTHS Performed at [facility], [address] [place], [place] [address]   . Prothrombin Time 11/15/2020 13.9  11.4 - 15.2 seconds Final   . INR 11/15/2020 1.1  0.8 - 1.2 Final   Comment: (NOTE) INR goal varies based on device and disease states. Performed at [facility], [address] [place], [place] [address]   . WBC 11/15/2020 7.4  4.0 - 10.5 K/uL Final  . RBC 11/15/2020 3.26* 4.22 - 5.81 MIL/uL Final  . Hemoglobin 11/15/2020 9.8* 13.0 - 17.0 g/dL Final  . HCT 11/15/2020 29.3* 39.0 - 52.0 % Final  . MCV 11/15/2020 89.9  80.0 - 100.0 fL Final  . MCH 11/15/2020 30.1  26.0 - 34.0 pg Final  . MCHC 11/15/2020 33.4  30.0 - 36.0 g/dL Final  . RDW 11/15/2020 15.5  11.5 - 15.5 % Final  . Platelets 11/15/2020 140* 150 - 400 K/uL Final  . nRBC 11/15/2020 0.0  0.0 - 0.2 % Final   Performed at [facility], [address] [place], [place] [address]  . Sodium 11/15/2020 135  135 - 145 mmol/L Final  . Potassium 11/15/2020 5.2* 3.5 - 5.1 mmol/L Final  . Chloride 11/15/2020 103  98 - 111 mmol/L Final  . CO2 11/15/2020 22  22 - 32 mmol/L Final  . Glucose, Bld 11/15/2020 82  70 - 99 mg/dL Final   Glucose reference range applies only to samples taken after fasting for at least 8 hours.  . BUN 11/15/2020 65* 8 - 23 mg/dL Final  . Creatinine, Ser 11/15/2020 6.53* 0.61 - 1.24 mg/dL Final  . Calcium 11/15/2020 8.1* 8.9 - 10.3 mg/dL Final  . GFR, Estimated 11/15/2020 9* >60 mL/min Final   Comment: (NOTE) Calculated using the CKD-EPI Creatinine Equation (2021)   . Anion gap 11/15/2020 10  5 - 15 Final   Performed at [facility], [address] [place],  [address]  . SARS Coronavirus 2 11/15/2020 NEGATIVE  NEGATIVE Final   Comment: (NOTE) SARS-CoV-2 target nucleic acids are NOT DETECTED.  The SARS-CoV-2 RNA is generally detectable in upper and lower respiratory specimens during the acute phase of infection. Negative results do not preclude SARS-CoV-2 infection, do not rule out co-infections with other pathogens, and should not be used as the sole basis for treatment or other patient  management decisions. Negative results must be combined with clinical observations, patient history, and epidemiological information. The expected result is Negative.  Fact Sheet for Patients: SugarRoll.be  Fact Sheet for Healthcare Providers: https://www.woods-mathews.com/  This test is not yet  approved or cleared by the [place] and  has been authorized for detection and/or diagnosis of SARS-CoV-2 by FDA under an Emergency Use Authorization (EUA). This EUA will remain  in effect (meaning this test can be used) for the duration of the COVID-19 declaration under Se                          ction 564(b)(1) of the Act, 21 U.S.C. section 360bbb-3(b)(1), unless the authorization is terminated or revoked sooner.  Performed at [facility], [place] [address] [place], [place] [address]     ECG: Date: 09/10/2020 Time ECG obtained: 0906 AM Rate: 72 bpm Rhythm: normal sinus Axis (leads I and aVF): Normal Intervals: PR 144 ms. QRS 78 ms. QTc 473 ms. ST segment and T wave changes: No evidence of acute ST segment elevation or depression Comparison: Similar to previous tracing obtained on 08/15/2020   IMAGING / PROCEDURES: ULTRASOUND spleen performed on 10/26/2020 1. Mildly enlarged spleen measuring 14.5 cm in greatest dimension and demonstrating splenic volume of 634 cc 2. No intrasplenic lesions noted 3. The splenic vein is patent at the hilum with appropriate directional flow  MRI SHOULDER LEFT WITHOUT CONTRAST performed on 10/24/2020 1. Complete tear of the supraspinatus and infraspinatus tendons with 3.3 cm of retraction 2. Atrophy of the supraspinatus and infraspinatus muscles 3. Atrophy of the subscapularis muscle 4. Severe atrophy of the acromioclavicular joint 5. Multiloculated cystic mass along the superior aspect of the [place] joint consistent with a large ganglion cyst measuring 4.2 x 3.7 x 2.3 cm 6. Indeterminate  heterogeneous marrow involving the humeral head and neck as well as the glenoid.  Recommend correlation with laboratory values (CBC and SPEP).  CT SHOULDER LEFT WITHOUT CONTRAST performed on 10/24/2020 1. Loss of the acromial humeral distance consistent with a supraspinatus and infraspinatus tendon tear.   2. Mild atrophy of the rotator cuff muscles 3. Severe osteoarthritis of the glenohumeral joint 4. Aortic atherosclerosis 5. Patchy sclerosis in the humeral head corresponding to the marrow abnormality seen on MRI performed same day.  This is indeterminate etiology.  Recommend correlation with CBC and SPEP.  ECHOCARDIOGRAM performed 09/07/2020 1. Left ventricular ejection fraction, by estimation, is 60 to 65%. The left ventricle has normal function. The left ventricle has no regional wall motion abnormalities. Left ventricular diastolic parameters are consistent with Grade I diastolic dysfunction (impaired relaxation). The average left ventricular global longitudinal strain is -15.8 %.  2. Right ventricular systolic function is normal. The right ventricular size is normal. There is normal pulmonary artery systolic pressure. The estimated right ventricular systolic pressure is 85.0 mmHg.  3. Mild to moderate mitral valve regurgitation.   LEXISCAN performed on 08/21/2020 1. There was no ST segment deviation noted during stress 2. No T wave inversion was noted during stress 3. Study was normal 4. This normal low risk study 5. Left ventricular ejection fraction appears normal visually although calculated EF is 51%.  Correlation with echo was advised 6. There is no evidence for ischemia  Impression and Plan:  James Arnold has been referred for pre-anesthesia review and clearance prior to him undergoing the planned anesthetic and procedural courses. Available labs, pertinent testing, and imaging results were personally reviewed by me. This patient has been appropriately cleared by his healthcare  providers with noted corresponding risk stratifications for significant perioperative complications as follows: cardiology (acceptable), nephrology (low), internal medicine (high), and hematology/oncology (acceptable from hematology perspective).  Based on clinical review performed today (11/16/20), barring any significant acute changes in the patient's overall condition, it is anticipated that he will be able to proceed with the planned surgical intervention. Any acute changes in clinical condition may necessitate his procedure being postponed and/or cancelled. Pre-surgical instructions were reviewed with the patient during his PAT appointment and questions were fielded by PAT clinical staff.  Honor Loh, MSN, APRN, FNP-C, CEN [facility]  Peri-operative Services Nurse Practitioner Phone: [phone] 11/16/20 12:37 PM  NOTE: This note has been prepared using Dragon dictation software. Despite my best ability to proofread, there is always the potential that unintentional transcriptional errors may still occur from this process.

## 2020-11-16 ENCOUNTER — Ambulatory Visit (INDEPENDENT_AMBULATORY_CARE_PROVIDER_SITE_OTHER): Payer: Medicare HMO | Admitting: Internal Medicine

## 2020-11-16 ENCOUNTER — Telehealth: Payer: Self-pay | Admitting: *Deleted

## 2020-11-16 ENCOUNTER — Encounter: Payer: Self-pay | Admitting: Internal Medicine

## 2020-11-16 VITALS — Ht 66.34 in | Wt 163.0 lb

## 2020-11-16 DIAGNOSIS — Z01818 Encounter for other preprocedural examination: Secondary | ICD-10-CM

## 2020-11-16 DIAGNOSIS — Z992 Dependence on renal dialysis: Secondary | ICD-10-CM | POA: Diagnosis not present

## 2020-11-16 DIAGNOSIS — N186 End stage renal disease: Secondary | ICD-10-CM | POA: Diagnosis not present

## 2020-11-16 NOTE — Telephone Encounter (Signed)
   Primary Cardiologist: Nelva Bush, MD  Chart reviewed as part of pre-operative protocol coverage. Given past medical history and time since last visit, based on ACC/AHA guidelines, James Arnold would be at acceptable risk for the planned procedure without further cardiovascular testing.  Recent stress test in November 2021 was low risk.  Today's EKG was reviewed and is unchanged when compared to the previous EKG.  Patient has not had any new cardiac symptoms.  Previous plan to clear the patient for surgery is unchanged.  The patient was advised that if he develops new symptoms prior to surgery to contact our office to arrange for a follow-up visit, and he verbalized understanding.  I will route this recommendation to the requesting party via Epic fax function and remove from pre-op pool.  Please call with questions.  [place], [place] 11/16/2020, 12:11 PM

## 2020-11-16 NOTE — Progress Notes (Signed)
Ht 5' 6.34" (1.685 m)   Wt 163 lb (73.9 kg)   SpO2 98%   BMI 26.04 kg/m    Subjective:    Patient ID: [name], male    DOB: [birth year], 65 y.o.   MRN: [id number]  HPI: DARYLL SPISAK is a 65 y.o. male  Patient is here for preop preop clearance supposed to have right shoulder arthroplasty she has a history of CKD on dialysis with a  GFR of 7,  noted hemoglobin is low at 9.8 since last checked.  , also history of JAK2 positive primary myelo fibrosis on rx per heme onc.       Chief Complaint  Patient presents with  . surgical clearance    Relevant past medical, surgical, family and social history reviewed and updated as indicated. Interim medical history since our last visit reviewed. Allergies and medications reviewed and updated.  Review of Systems  Constitutional: Negative for activity change, appetite change, chills, fatigue and fever.  HENT: Negative for congestion, ear discharge, ear pain and facial swelling.   Eyes: Negative for pain, discharge and itching.  Respiratory: Negative for cough, chest tightness, shortness of breath and wheezing.   Cardiovascular: Negative for chest pain, palpitations and leg swelling.  Gastrointestinal: Negative for abdominal distention, abdominal pain, blood in stool, constipation, diarrhea, nausea and vomiting.  Endocrine: Negative for cold intolerance, heat intolerance, polydipsia, polyphagia and polyuria.  Genitourinary: Negative for difficulty urinating, dysuria, flank pain, frequency, hematuria and urgency.  Musculoskeletal: Negative for arthralgias, gait problem, joint swelling and myalgias.  Skin: Negative for color change, rash and wound.  Neurological: Negative for dizziness, tremors, speech difficulty, weakness, light-headedness, numbness and headaches.  Hematological: Does not bruise/bleed easily.  Psychiatric/Behavioral: Negative for confusion and suicidal ideas.    Per HPI unless specifically indicated above      Objective:    Ht 5' 6.34" (1.685 m)   Wt 163 lb (73.9 kg)   SpO2 98%   BMI 26.04 kg/m   Wt Readings from Last 3 Encounters:  11/16/20 163 lb (73.9 kg)  11/07/20 166 lb (75.3 kg)  11/05/20 161 lb (73 kg)    Physical Exam  Results for orders placed or performed during the hospital encounter of 11/15/20  Surgical pcr screen   Specimen: Nasal Mucosa; Nasal Swab  Result Value Ref Range   MRSA, PCR NEGATIVE NEGATIVE   Staphylococcus aureus POSITIVE (A) NEGATIVE  SARS CORONAVIRUS 2 (TAT 6-24 HRS) Nasopharyngeal Nasopharyngeal Swab   Specimen: Nasopharyngeal Swab  Result Value Ref Range   SARS Coronavirus 2 NEGATIVE NEGATIVE  APTT  Result Value Ref Range   aPTT 28 24 - 36 seconds  PT- INR at PAT visit (Pre-admission Testing) per protocol  Result Value Ref Range   Prothrombin Time 13.9 11.4 - 15.2 seconds   INR 1.1 0.8 - 1.2  CBC  Result Value Ref Range   WBC 7.4 4.0 - 10.5 K/uL   RBC 3.26 (L) 4.22 - 5.81 MIL/uL   Hemoglobin 9.8 (L) 13.0 - 17.0 g/dL   HCT 29.3 (L) 39.0 - 52.0 %   MCV 89.9 80.0 - 100.0 fL   MCH 30.1 26.0 - 34.0 pg   MCHC 33.4 30.0 - 36.0 g/dL   RDW 15.5 11.5 - 15.5 %   Platelets 140 (L) 150 - 400 K/uL   nRBC 0.0 0.0 - 0.2 %  Basic metabolic panel  Result Value Ref Range   Sodium 135 135 - 145 mmol/L   Potassium 5.2 (  H) 3.5 - 5.1 mmol/L   Chloride 103 98 - 111 mmol/L   CO2 22 22 - 32 mmol/L   Glucose, Bld 82 70 - 99 mg/dL   BUN 65 (H) 8 - 23 mg/dL   Creatinine, Ser 6.53 (H) 0.61 - 1.24 mg/dL   Calcium 8.1 (L) 8.9 - 10.3 mg/dL   GFR, Estimated 9 (L) >60 mL/min   Anion gap 10 5 - 15  Type and screen [facility]  Result Value Ref Range   ABO/RH(D) A POS    Antibody Screen NEG    Sample Expiration 11/29/2020,2359    Extend sample reason      NO TRANSFUSIONS OR PREGNANCY IN THE PAST 3 MONTHS Performed at [facility], [address] [place], [place] [address]       Assessment & Plan:  preop clearance :  Patient is  at a HIGH risk for undergoing any surgical procedure,i dont have much information on the type of procedure /blood loss/anesthesia invlolved etc associated with the surgery today ,but it appears the surgery is a intermediate risk procedure that the pt  is undergoing ,patient will need to d/w the surgeon doing the procedure and weigh the risks vs benefits before procedding with the procdure.I have advised the pt today to disscuss this with the surgeon and then proceed from there on forward.Meanwhile check EKg/XRay chest r/o any cardio-pulmonary etiologies. Pt is on Dialysis with a GFR of 7 and will need clearance from Nephrology and cardiology before proceeding with any procedure. triedf to call nephrology office they are closed today will be seen next week for preop clearance depending on their availability. EKG abnormal with nonspecific ST-T changes will refer to cardiology service before any surgery.    Problem List Items Addressed This Visit   None   Visit Diagnoses    Preoperative clearance    -  Primary   Relevant Orders   EKG 12-Lead (Completed)       Follow up plan: No follow-ups on file.   Attached are messages from [place] and   Dr. Neomia Dear... Gaspar Bidding here from pre-admission/anesthesia testing at [facility]. I was reading over your note for this patient. [place] for seeing him. I just received your faxed clearances. -Patient will receive a regional nerve block + general anesthesia for his procedure -I have reached out to nephrology and they have cleared him with a LOW risk stratification -Cardiology has also signed off on him being an acceptable risk -Trying to be overly thorough, I am asking for hematology input as well with his JAK (+) myelofibrosis Dx. With this information.... are we able to proceed?  Pending cardiology and nephrology clearance, pt is ok to proceed with surgery Hi. Dr. Neomia Dear. Sorry to bother you during clinic. My name is Isaac Laud, I am the cardiology PA who is working in  the preop pool for [employer] today. Mr. Chalmers was previously cleared for surgery by my colleague last month based on a normal nuclear stress test on Nov 30th 2021. I took a look at his EKG today and did a comparison with previous EKG, there does not seems to be any significant change. Since I never met the patient myself, I wanted to double check with you to see if there was anything concerning during your visit with him today that will warrant another cardiology visit

## 2020-11-16 NOTE — Telephone Encounter (Signed)
Request for pre-operative cardiac clearance Received: Today Karen Kitchens, NP  [facility] Request for pre-operative cardiac clearance:    1. What type of surgery is being performed?  LEFT reverse shoulder arthroplasty and biceps tenodesis   2. When is this surgery scheduled?  11/19/2020    3. Are there any medications that need to be held prior to surgery?  ASA   4. Practice name and name of physician performing surgery?  Performing surgeon: Dr. Leim Fabry, MD  Requesting clearance: Honor Loh, FNP-C     5. Anesthesia type (none, local, MAC, general)? General   6. What is the office phone and fax number?   Phone: [phone]  Fax: [phone]   NOTES:  Patient previously cleared by CV DIV POOL (Cleaver, NP-C) on 10/15/2020. Patient was seen by PCP today for medical clearance and the following note was entered by evaluating physician Neomia Dear, MD), "EKG interpreted by me. Sinus rhythm nonspecific ST-T changes noted will need to see cardiology for further evaluation". Please review 12 lead and comment on whether or not patient will need to be seen or if we are still cleared to proceed.   ATTENTION: Unable to create telephone message as per your standard workflow. Directed by [facility] providers to send requests for cardiac clearance to this pool for appropriate distribution to provider covering pre-operative clearances.   Honor Loh, MSN, APRN, FNP-C, CEN  [facility]  Peri-operative Services Nurse Practitioner  Phone: [phone]  11/16/20 10:19 AM

## 2020-11-16 NOTE — Progress Notes (Signed)
EKG interpreted by me.  Sinus rhythm nonspecific ST-T changes noted will need to see cardiology for further evaluation

## 2020-11-18 DIAGNOSIS — Z992 Dependence on renal dialysis: Secondary | ICD-10-CM | POA: Diagnosis not present

## 2020-11-18 DIAGNOSIS — N186 End stage renal disease: Secondary | ICD-10-CM | POA: Diagnosis not present

## 2020-11-19 ENCOUNTER — Inpatient Hospital Stay
Admission: RE | Admit: 2020-11-19 | Discharge: 2020-11-24 | DRG: 483 | Disposition: A | Discharge status: Home / Self Care | Payer: Medicare HMO | Attending: Internal Medicine | Admitting: Internal Medicine

## 2020-11-19 ENCOUNTER — Inpatient Hospital Stay: Payer: Medicare HMO

## 2020-11-19 ENCOUNTER — Ambulatory Visit: Payer: Medicare HMO | Admitting: Urgent Care

## 2020-11-19 ENCOUNTER — Ambulatory Visit: Payer: Medicare HMO

## 2020-11-19 ENCOUNTER — Encounter
Admission: RE | Disposition: A | Discharge status: Home / Self Care | Payer: Self-pay | Source: Home / Self Care | Attending: Orthopedic Surgery

## 2020-11-19 ENCOUNTER — Encounter: Payer: Self-pay | Admitting: Orthopedic Surgery

## 2020-11-19 DIAGNOSIS — M75122 Complete rotator cuff tear or rupture of left shoulder, not specified as traumatic: Principal | ICD-10-CM | POA: Diagnosis present

## 2020-11-19 DIAGNOSIS — D638 Anemia in other chronic diseases classified elsewhere: Secondary | ICD-10-CM

## 2020-11-19 DIAGNOSIS — F339 Major depressive disorder, recurrent, unspecified: Secondary | ICD-10-CM | POA: Diagnosis not present

## 2020-11-19 DIAGNOSIS — N320 Bladder-neck obstruction: Secondary | ICD-10-CM

## 2020-11-19 DIAGNOSIS — I6389 Other cerebral infarction: Secondary | ICD-10-CM | POA: Diagnosis not present

## 2020-11-19 DIAGNOSIS — E785 Hyperlipidemia, unspecified: Secondary | ICD-10-CM | POA: Diagnosis present

## 2020-11-19 DIAGNOSIS — N401 Enlarged prostate with lower urinary tract symptoms: Secondary | ICD-10-CM | POA: Diagnosis present

## 2020-11-19 DIAGNOSIS — Z955 Presence of coronary angioplasty implant and graft: Secondary | ICD-10-CM

## 2020-11-19 DIAGNOSIS — N2581 Secondary hyperparathyroidism of renal origin: Secondary | ICD-10-CM | POA: Diagnosis present

## 2020-11-19 DIAGNOSIS — H748X3 Other specified disorders of middle ear and mastoid, bilateral: Secondary | ICD-10-CM | POA: Diagnosis not present

## 2020-11-19 DIAGNOSIS — M109 Gout, unspecified: Secondary | ICD-10-CM | POA: Diagnosis present

## 2020-11-19 DIAGNOSIS — F32A Depression, unspecified: Secondary | ICD-10-CM | POA: Diagnosis present

## 2020-11-19 DIAGNOSIS — Z96612 Presence of left artificial shoulder joint: Secondary | ICD-10-CM | POA: Diagnosis not present

## 2020-11-19 DIAGNOSIS — J432 Centrilobular emphysema: Secondary | ICD-10-CM | POA: Diagnosis present

## 2020-11-19 DIAGNOSIS — I252 Old myocardial infarction: Secondary | ICD-10-CM | POA: Diagnosis not present

## 2020-11-19 DIAGNOSIS — D72829 Elevated white blood cell count, unspecified: Secondary | ICD-10-CM | POA: Diagnosis present

## 2020-11-19 DIAGNOSIS — Z720 Tobacco use: Secondary | ICD-10-CM | POA: Diagnosis not present

## 2020-11-19 DIAGNOSIS — F17210 Nicotine dependence, cigarettes, uncomplicated: Secondary | ICD-10-CM | POA: Diagnosis present

## 2020-11-19 DIAGNOSIS — G8324 Monoplegia of upper limb affecting left nondominant side: Secondary | ICD-10-CM | POA: Diagnosis not present

## 2020-11-19 DIAGNOSIS — K222 Esophageal obstruction: Secondary | ICD-10-CM | POA: Diagnosis not present

## 2020-11-19 DIAGNOSIS — E875 Hyperkalemia: Secondary | ICD-10-CM | POA: Diagnosis not present

## 2020-11-19 DIAGNOSIS — D7581 Myelofibrosis: Secondary | ICD-10-CM | POA: Diagnosis present

## 2020-11-19 DIAGNOSIS — N185 Chronic kidney disease, stage 5: Secondary | ICD-10-CM | POA: Diagnosis not present

## 2020-11-19 DIAGNOSIS — R569 Unspecified convulsions: Secondary | ICD-10-CM

## 2020-11-19 DIAGNOSIS — D471 Chronic myeloproliferative disease: Secondary | ICD-10-CM | POA: Diagnosis not present

## 2020-11-19 DIAGNOSIS — R1310 Dysphagia, unspecified: Secondary | ICD-10-CM | POA: Diagnosis not present

## 2020-11-19 DIAGNOSIS — I6381 Other cerebral infarction due to occlusion or stenosis of small artery: Secondary | ICD-10-CM | POA: Diagnosis not present

## 2020-11-19 DIAGNOSIS — G8918 Other acute postprocedural pain: Secondary | ICD-10-CM | POA: Diagnosis not present

## 2020-11-19 DIAGNOSIS — I472 Ventricular tachycardia: Secondary | ICD-10-CM | POA: Diagnosis not present

## 2020-11-19 DIAGNOSIS — I2510 Atherosclerotic heart disease of native coronary artery without angina pectoris: Secondary | ICD-10-CM | POA: Diagnosis not present

## 2020-11-19 DIAGNOSIS — I639 Cerebral infarction, unspecified: Secondary | ICD-10-CM | POA: Diagnosis not present

## 2020-11-19 DIAGNOSIS — I97821 Postprocedural cerebrovascular infarction during other surgery: Secondary | ICD-10-CM | POA: Diagnosis not present

## 2020-11-19 DIAGNOSIS — G8929 Other chronic pain: Secondary | ICD-10-CM | POA: Diagnosis present

## 2020-11-19 DIAGNOSIS — I63233 Cerebral infarction due to unspecified occlusion or stenosis of bilateral carotid arteries: Secondary | ICD-10-CM | POA: Diagnosis not present

## 2020-11-19 DIAGNOSIS — Z96619 Presence of unspecified artificial shoulder joint: Secondary | ICD-10-CM

## 2020-11-19 DIAGNOSIS — Z419 Encounter for procedure for purposes other than remedying health state, unspecified: Secondary | ICD-10-CM

## 2020-11-19 DIAGNOSIS — N269 Renal sclerosis, unspecified: Secondary | ICD-10-CM | POA: Diagnosis present

## 2020-11-19 DIAGNOSIS — E538 Deficiency of other specified B group vitamins: Secondary | ICD-10-CM | POA: Diagnosis present

## 2020-11-19 DIAGNOSIS — D631 Anemia in chronic kidney disease: Secondary | ICD-10-CM | POA: Diagnosis not present

## 2020-11-19 DIAGNOSIS — M75121 Complete rotator cuff tear or rupture of right shoulder, not specified as traumatic: Secondary | ICD-10-CM | POA: Diagnosis not present

## 2020-11-19 DIAGNOSIS — R339 Retention of urine, unspecified: Secondary | ICD-10-CM | POA: Diagnosis not present

## 2020-11-19 DIAGNOSIS — Z992 Dependence on renal dialysis: Secondary | ICD-10-CM | POA: Diagnosis not present

## 2020-11-19 DIAGNOSIS — Z79899 Other long term (current) drug therapy: Secondary | ICD-10-CM

## 2020-11-19 DIAGNOSIS — M25512 Pain in left shoulder: Secondary | ICD-10-CM | POA: Diagnosis not present

## 2020-11-19 DIAGNOSIS — M12819 Other specific arthropathies, not elsewhere classified, unspecified shoulder: Secondary | ICD-10-CM | POA: Diagnosis present

## 2020-11-19 DIAGNOSIS — D519 Vitamin B12 deficiency anemia, unspecified: Secondary | ICD-10-CM | POA: Diagnosis not present

## 2020-11-19 DIAGNOSIS — I63511 Cerebral infarction due to unspecified occlusion or stenosis of right middle cerebral artery: Secondary | ICD-10-CM | POA: Diagnosis not present

## 2020-11-19 DIAGNOSIS — I25118 Atherosclerotic heart disease of native coronary artery with other forms of angina pectoris: Secondary | ICD-10-CM | POA: Diagnosis not present

## 2020-11-19 DIAGNOSIS — D696 Thrombocytopenia, unspecified: Secondary | ICD-10-CM | POA: Diagnosis not present

## 2020-11-19 DIAGNOSIS — M12812 Other specific arthropathies, not elsewhere classified, left shoulder: Secondary | ICD-10-CM | POA: Diagnosis not present

## 2020-11-19 DIAGNOSIS — R338 Other retention of urine: Secondary | ICD-10-CM | POA: Diagnosis not present

## 2020-11-19 DIAGNOSIS — N186 End stage renal disease: Secondary | ICD-10-CM | POA: Diagnosis not present

## 2020-11-19 DIAGNOSIS — I619 Nontraumatic intracerebral hemorrhage, unspecified: Secondary | ICD-10-CM | POA: Diagnosis not present

## 2020-11-19 DIAGNOSIS — Z7952 Long term (current) use of systemic steroids: Secondary | ICD-10-CM

## 2020-11-19 DIAGNOSIS — N3289 Other specified disorders of bladder: Secondary | ICD-10-CM | POA: Diagnosis not present

## 2020-11-19 DIAGNOSIS — Z87898 Personal history of other specified conditions: Secondary | ICD-10-CM

## 2020-11-19 DIAGNOSIS — Z823 Family history of stroke: Secondary | ICD-10-CM

## 2020-11-19 DIAGNOSIS — I120 Hypertensive chronic kidney disease with stage 5 chronic kidney disease or end stage renal disease: Secondary | ICD-10-CM | POA: Diagnosis not present

## 2020-11-19 DIAGNOSIS — Z471 Aftercare following joint replacement surgery: Secondary | ICD-10-CM | POA: Diagnosis not present

## 2020-11-19 DIAGNOSIS — R809 Proteinuria, unspecified: Secondary | ICD-10-CM | POA: Diagnosis present

## 2020-11-19 HISTORY — DX: Chronic kidney disease, stage 5: N18.5

## 2020-11-19 HISTORY — PX: REVERSE SHOULDER ARTHROPLASTY: SHX5054

## 2020-11-19 LAB — POCT I-STAT, CHEM 8
BUN: 71 mg/dL — ABNORMAL HIGH (ref 8–23)
Calcium, Ion: 1.02 mmol/L — ABNORMAL LOW (ref 1.15–1.40)
Chloride: 105 mmol/L (ref 98–111)
Creatinine, Ser: 7.3 mg/dL — ABNORMAL HIGH (ref 0.61–1.24)
Glucose, Bld: 100 mg/dL — ABNORMAL HIGH (ref 70–99)
HCT: 28 % — ABNORMAL LOW (ref 39.0–52.0)
Hemoglobin: 9.5 g/dL — ABNORMAL LOW (ref 13.0–17.0)
Potassium: 4.6 mmol/L (ref 3.5–5.1)
Sodium: 137 mmol/L (ref 135–145)
TCO2: 19 mmol/L — ABNORMAL LOW (ref 22–32)

## 2020-11-19 SURGERY — ARTHROPLASTY, SHOULDER, TOTAL, REVERSE
Anesthesia: Regional | Site: Shoulder | Laterality: Left

## 2020-11-19 MED ORDER — PHENOL 1.4 % MT LIQD
1.0000 | OROMUCOSAL | Status: DC | PRN
  Filled 2020-11-19: qty 177

## 2020-11-19 MED ORDER — ASPIRIN EC 325 MG PO TBEC: 325.0000 mg | DELAYED_RELEASE_TABLET | Freq: Every day | ORAL | Status: DC

## 2020-11-19 MED ORDER — MIDAZOLAM HCL 2 MG/2ML IJ SOLN: 1.0000 mg | Freq: Once | INTRAMUSCULAR | Status: AC

## 2020-11-19 MED ORDER — IPRATROPIUM-ALBUTEROL 0.5-2.5 (3) MG/3ML IN SOLN: 3.0000 mL | Freq: Once | RESPIRATORY_TRACT | Status: AC

## 2020-11-19 MED ORDER — GLYCOPYRROLATE 0.2 MG/ML IJ SOLN
INTRAMUSCULAR | Status: DC | PRN
  Administered 2020-11-19: .6 mg via INTRAVENOUS
  Administered 2020-11-19: .2 mg via INTRAVENOUS

## 2020-11-19 MED ORDER — OXYCODONE HCL 5 MG PO TABS
5.0000 mg | ORAL_TABLET | ORAL | Status: DC | PRN
  Administered 2020-11-20: 10 mg via ORAL
  Administered 2020-11-20: 5 mg via ORAL
  Administered 2020-11-21 – 2020-11-23 (×4): 10 mg via ORAL
  Filled 2020-11-19 (×4): qty 2
  Filled 2020-11-19: qty 1
  Filled 2020-11-19: qty 2

## 2020-11-19 MED ORDER — IPRATROPIUM-ALBUTEROL 0.5-2.5 (3) MG/3ML IN SOLN: 3.0000 mL | RESPIRATORY_TRACT | Status: DC

## 2020-11-19 MED ORDER — FENTANYL CITRATE (PF) 100 MCG/2ML IJ SOLN
25.0000 ug | INTRAMUSCULAR | Status: DC | PRN
Start: 2020-11-19 — End: 2020-11-19

## 2020-11-19 MED ORDER — LIDOCAINE HCL (CARDIAC) PF 100 MG/5ML IV SOSY
PREFILLED_SYRINGE | INTRAVENOUS | Status: DC | PRN
  Administered 2020-11-19: 80 mg via INTRAVENOUS

## 2020-11-19 MED ORDER — TRANEXAMIC ACID 1000 MG/10ML IV SOLN
INTRAVENOUS | Status: AC
  Filled 2020-11-19: qty 10

## 2020-11-19 MED ORDER — FERROUS SULFATE 325 (65 FE) MG PO TABS
325.0000 mg | ORAL_TABLET | Freq: Every day | ORAL | Status: DC
  Administered 2020-11-20 – 2020-11-24 (×5): 325 mg via ORAL
  Filled 2020-11-19 (×5): qty 1

## 2020-11-19 MED ORDER — KETAMINE HCL 50 MG/5ML IJ SOSY
PREFILLED_SYRINGE | INTRAMUSCULAR | Status: AC
  Filled 2020-11-19: qty 5

## 2020-11-19 MED ORDER — NEOMYCIN-POLYMYXIN B GU 40-200000 IR SOLN
Status: AC
  Filled 2020-11-19: qty 20

## 2020-11-19 MED ORDER — FENTANYL CITRATE (PF) 100 MCG/2ML IJ SOLN
INTRAMUSCULAR | Status: AC
  Administered 2020-11-19: 50 ug via INTRAVENOUS
  Filled 2020-11-19: qty 2

## 2020-11-19 MED ORDER — NEOMYCIN-POLYMYXIN B GU 40-200000 IR SOLN
20.0000 mL | Freq: Once | Status: DC
  Filled 2020-11-19 (×2): qty 20

## 2020-11-19 MED ORDER — SODIUM CHLORIDE 0.9 % IV SOLN
INTRAVENOUS | Status: DC | PRN
  Administered 2020-11-19: 30 ug/min via INTRAVENOUS

## 2020-11-19 MED ORDER — KETAMINE HCL 10 MG/ML IJ SOLN
INTRAMUSCULAR | Status: DC | PRN
  Administered 2020-11-19 (×2): 20 mg via INTRAVENOUS

## 2020-11-19 MED ORDER — VANCOMYCIN HCL 1000 MG IV SOLR
INTRAVENOUS | Status: DC | PRN
  Administered 2020-11-19: 1000 mg via TOPICAL

## 2020-11-19 MED ORDER — SODIUM CHLORIDE 0.9 % IV SOLN: INTRAVENOUS | Status: DC

## 2020-11-19 MED ORDER — PREDNISONE 10 MG PO TABS
10.0000 mg | ORAL_TABLET | Freq: Every day | ORAL | Status: DC
  Administered 2020-11-20 – 2020-11-24 (×5): 10 mg via ORAL
  Filled 2020-11-19 (×6): qty 1

## 2020-11-19 MED ORDER — METOCLOPRAMIDE HCL 10 MG PO TABS
5.0000 mg | ORAL_TABLET | Freq: Three times a day (TID) | ORAL | Status: DC | PRN
Start: 2020-11-19 — End: 2020-11-20
  Filled 2020-11-19: qty 1

## 2020-11-19 MED ORDER — EPHEDRINE 5 MG/ML INJ
INTRAVENOUS | Status: AC
  Filled 2020-11-19: qty 10

## 2020-11-19 MED ORDER — FENTANYL CITRATE (PF) 100 MCG/2ML IJ SOLN
INTRAMUSCULAR | Status: AC
  Filled 2020-11-19: qty 2

## 2020-11-19 MED ORDER — OXYCODONE HCL 5 MG PO TABS
10.0000 mg | ORAL_TABLET | ORAL | Status: DC | PRN
  Administered 2020-11-21: 5 mg via ORAL
  Administered 2020-11-21: 10 mg via ORAL
  Administered 2020-11-21: 15 mg via ORAL
  Administered 2020-11-23: 10 mg via ORAL
  Filled 2020-11-19 (×3): qty 2
  Filled 2020-11-19: qty 3

## 2020-11-19 MED ORDER — DEXAMETHASONE SODIUM PHOSPHATE 10 MG/ML IJ SOLN
INTRAMUSCULAR | Status: DC | PRN
  Administered 2020-11-19: 10 mg via INTRAVENOUS

## 2020-11-19 MED ORDER — CHLORHEXIDINE GLUCONATE 0.12 % MT SOLN
OROMUCOSAL | Status: AC
  Administered 2020-11-19: 15 mL via OROMUCOSAL
  Filled 2020-11-19: qty 15

## 2020-11-19 MED ORDER — DEXAMETHASONE SODIUM PHOSPHATE 10 MG/ML IJ SOLN
INTRAMUSCULAR | Status: AC
  Filled 2020-11-19: qty 1

## 2020-11-19 MED ORDER — CARVEDILOL 6.25 MG PO TABS
18.7500 mg | ORAL_TABLET | Freq: Two times a day (BID) | ORAL | Status: DC
  Administered 2020-11-19 – 2020-11-24 (×8): 18.75 mg via ORAL
  Filled 2020-11-19 (×10): qty 1

## 2020-11-19 MED ORDER — METOCLOPRAMIDE HCL 5 MG/ML IJ SOLN: 5.0000 mg | Freq: Three times a day (TID) | INTRAMUSCULAR | Status: DC | PRN

## 2020-11-19 MED ORDER — BUPIVACAINE HCL (PF) 0.5 % IJ SOLN
INTRAMUSCULAR | Status: AC
  Filled 2020-11-19: qty 30

## 2020-11-19 MED ORDER — FENTANYL CITRATE (PF) 100 MCG/2ML IJ SOLN
INTRAMUSCULAR | Status: DC | PRN
  Administered 2020-11-19 (×3): 50 ug via INTRAVENOUS

## 2020-11-19 MED ORDER — FENTANYL CITRATE (PF) 100 MCG/2ML IJ SOLN: 50.0000 ug | Freq: Once | INTRAMUSCULAR | Status: AC

## 2020-11-19 MED ORDER — BUPIVACAINE LIPOSOME 1.3 % IJ SUSP
INTRAMUSCULAR | Status: DC | PRN
  Administered 2020-11-19: 20 mL via PERINEURAL

## 2020-11-19 MED ORDER — ONDANSETRON HCL 4 MG/2ML IJ SOLN: 4.0000 mg | Freq: Once | INTRAMUSCULAR | Status: DC | PRN

## 2020-11-19 MED ORDER — MENTHOL 3 MG MT LOZG
1.0000 | LOZENGE | OROMUCOSAL | Status: DC | PRN
  Filled 2020-11-19: qty 9

## 2020-11-19 MED ORDER — LOSARTAN POTASSIUM 50 MG PO TABS
50.0000 mg | ORAL_TABLET | Freq: Every day | ORAL | Status: DC
  Administered 2020-11-20 – 2020-11-24 (×5): 50 mg via ORAL
  Filled 2020-11-19 (×5): qty 1

## 2020-11-19 MED ORDER — ONDANSETRON HCL 4 MG PO TABS: 4.0000 mg | ORAL_TABLET | Freq: Four times a day (QID) | ORAL | Status: DC | PRN

## 2020-11-19 MED ORDER — SENNOSIDES-DOCUSATE SODIUM 8.6-50 MG PO TABS: 1.0000 | ORAL_TABLET | Freq: Every evening | ORAL | Status: DC | PRN

## 2020-11-19 MED ORDER — VASOPRESSIN 20 UNIT/ML IV SOLN
INTRAVENOUS | Status: DC | PRN
  Administered 2020-11-19 (×5): 1 [IU] via INTRAVENOUS

## 2020-11-19 MED ORDER — SEVOFLURANE IN SOLN
RESPIRATORY_TRACT | Status: AC
  Filled 2020-11-19: qty 250

## 2020-11-19 MED ORDER — METHOCARBAMOL 500 MG PO TABS
500.0000 mg | ORAL_TABLET | Freq: Four times a day (QID) | ORAL | Status: DC | PRN
  Administered 2020-11-20: 500 mg via ORAL
  Filled 2020-11-19 (×2): qty 1

## 2020-11-19 MED ORDER — EPHEDRINE SULFATE 50 MG/ML IJ SOLN
INTRAMUSCULAR | Status: DC | PRN
  Administered 2020-11-19 (×3): 10 mg via INTRAVENOUS

## 2020-11-19 MED ORDER — PROPOFOL 10 MG/ML IV BOLUS
INTRAVENOUS | Status: DC | PRN
  Administered 2020-11-19: 100 mg via INTRAVENOUS

## 2020-11-19 MED ORDER — BUPIVACAINE LIPOSOME 1.3 % IJ SUSP
INTRAMUSCULAR | Status: AC
  Filled 2020-11-19: qty 20

## 2020-11-19 MED ORDER — NICOTINE 14 MG/24HR TD PT24
14.0000 mg | MEDICATED_PATCH | Freq: Every day | TRANSDERMAL | Status: DC
  Administered 2020-11-20 – 2020-11-24 (×5): 14 mg via TRANSDERMAL
  Filled 2020-11-19 (×5): qty 1

## 2020-11-19 MED ORDER — ACETAMINOPHEN 500 MG PO TABS
1000.0000 mg | ORAL_TABLET | Freq: Three times a day (TID) | ORAL | Status: AC
  Administered 2020-11-19 – 2020-11-20 (×4): 1000 mg via ORAL
  Filled 2020-11-19 (×4): qty 2

## 2020-11-19 MED ORDER — HYDROMORPHONE HCL 1 MG/ML IJ SOLN: 0.5000 mg | INTRAMUSCULAR | Status: DC | PRN

## 2020-11-19 MED ORDER — VARENICLINE TARTRATE 0.5 MG PO TABS
0.5000 mg | ORAL_TABLET | Freq: Two times a day (BID) | ORAL | Status: DC
  Filled 2020-11-19 (×4): qty 1

## 2020-11-19 MED ORDER — ONDANSETRON HCL 4 MG/2ML IJ SOLN
INTRAMUSCULAR | Status: DC | PRN
  Administered 2020-11-19: 4 mg via INTRAVENOUS

## 2020-11-19 MED ORDER — IPRATROPIUM-ALBUTEROL 0.5-2.5 (3) MG/3ML IN SOLN
RESPIRATORY_TRACT | Status: AC
  Administered 2020-11-19: 3 mL via RESPIRATORY_TRACT
  Filled 2020-11-19: qty 3

## 2020-11-19 MED ORDER — MIDAZOLAM HCL 2 MG/2ML IJ SOLN
INTRAMUSCULAR | Status: AC
  Filled 2020-11-19: qty 2

## 2020-11-19 MED ORDER — CEFAZOLIN SODIUM-DEXTROSE 2-4 GM/100ML-% IV SOLN
2.0000 g | Freq: Four times a day (QID) | INTRAVENOUS | Status: AC
  Administered 2020-11-19 – 2020-11-20 (×2): 2 g via INTRAVENOUS
  Filled 2020-11-19 (×2): qty 100

## 2020-11-19 MED ORDER — GLYCOPYRROLATE 0.2 MG/ML IJ SOLN
INTRAMUSCULAR | Status: AC
  Filled 2020-11-19: qty 1

## 2020-11-19 MED ORDER — ROCURONIUM BROMIDE 10 MG/ML (PF) SYRINGE
PREFILLED_SYRINGE | INTRAVENOUS | Status: AC
  Filled 2020-11-19: qty 10

## 2020-11-19 MED ORDER — TRANEXAMIC ACID-NACL 1000-0.7 MG/100ML-% IV SOLN
INTRAVENOUS | Status: AC
  Administered 2020-11-19: 1000 mg via INTRAVENOUS
  Filled 2020-11-19: qty 100

## 2020-11-19 MED ORDER — ONDANSETRON HCL 4 MG/2ML IJ SOLN
INTRAMUSCULAR | Status: AC
  Filled 2020-11-19: qty 2

## 2020-11-19 MED ORDER — ACETAMINOPHEN 10 MG/ML IV SOLN
INTRAVENOUS | Status: AC
  Filled 2020-11-19: qty 100

## 2020-11-19 MED ORDER — NEOSTIGMINE METHYLSULFATE 10 MG/10ML IV SOLN
INTRAVENOUS | Status: DC | PRN
  Administered 2020-11-19: 3 mg via INTRAVENOUS

## 2020-11-19 MED ORDER — LIDOCAINE HCL (PF) 1 % IJ SOLN
INTRAMUSCULAR | Status: DC | PRN
  Administered 2020-11-19: 3 mL

## 2020-11-19 MED ORDER — CEFAZOLIN SODIUM-DEXTROSE 2-4 GM/100ML-% IV SOLN
INTRAVENOUS | Status: AC
  Filled 2020-11-19: qty 100

## 2020-11-19 MED ORDER — CEFAZOLIN SODIUM-DEXTROSE 2-4 GM/100ML-% IV SOLN
2.0000 g | INTRAVENOUS | Status: AC
  Administered 2020-11-19: 2 g via INTRAVENOUS

## 2020-11-19 MED ORDER — ORAL CARE MOUTH RINSE: 15.0000 mL | Freq: Once | OROMUCOSAL | Status: AC

## 2020-11-19 MED ORDER — ALUM & MAG HYDROXIDE-SIMETH 200-200-20 MG/5ML PO SUSP: 30.0000 mL | ORAL | Status: DC | PRN

## 2020-11-19 MED ORDER — MIDAZOLAM HCL 2 MG/2ML IJ SOLN
INTRAMUSCULAR | Status: AC
  Administered 2020-11-19: 1 mg via INTRAVENOUS
  Filled 2020-11-19: qty 2

## 2020-11-19 MED ORDER — BUPIVACAINE-EPINEPHRINE (PF) 0.25% -1:200000 IJ SOLN
INTRAMUSCULAR | Status: AC
  Filled 2020-11-19: qty 30

## 2020-11-19 MED ORDER — CEFAZOLIN SODIUM-DEXTROSE 2-4 GM/100ML-% IV SOLN
INTRAVENOUS | Status: AC
  Administered 2020-11-19: 2 g via INTRAVENOUS
  Filled 2020-11-19: qty 100

## 2020-11-19 MED ORDER — FUROSEMIDE 20 MG PO TABS
20.0000 mg | ORAL_TABLET | Freq: Every day | ORAL | Status: DC
  Administered 2020-11-20 – 2020-11-24 (×5): 20 mg via ORAL
  Filled 2020-11-19 (×5): qty 1

## 2020-11-19 MED ORDER — LIDOCAINE HCL (PF) 1 % IJ SOLN
INTRAMUSCULAR | Status: AC
  Filled 2020-11-19: qty 5

## 2020-11-19 MED ORDER — ONDANSETRON HCL 4 MG/2ML IJ SOLN
4.0000 mg | Freq: Four times a day (QID) | INTRAMUSCULAR | Status: DC | PRN
  Administered 2020-11-20: 4 mg via INTRAVENOUS
  Filled 2020-11-19: qty 2

## 2020-11-19 MED ORDER — BUPIVACAINE HCL (PF) 0.5 % IJ SOLN
INTRAMUSCULAR | Status: DC | PRN
  Administered 2020-11-19: 10 mL via PERINEURAL

## 2020-11-19 MED ORDER — CHLORHEXIDINE GLUCONATE 0.12 % MT SOLN: 15.0000 mL | Freq: Once | OROMUCOSAL | Status: AC

## 2020-11-19 MED ORDER — TRANEXAMIC ACID-NACL 1000-0.7 MG/100ML-% IV SOLN
INTRAVENOUS | Status: DC | PRN
  Administered 2020-11-19: 1000 mg via INTRAVENOUS

## 2020-11-19 MED ORDER — SUCCINYLCHOLINE CHLORIDE 200 MG/10ML IV SOSY
PREFILLED_SYRINGE | INTRAVENOUS | Status: AC
  Filled 2020-11-19: qty 30

## 2020-11-19 MED ORDER — ROCURONIUM BROMIDE 100 MG/10ML IV SOLN
INTRAVENOUS | Status: DC | PRN
  Administered 2020-11-19: 20 mg via INTRAVENOUS
  Administered 2020-11-19: 40 mg via INTRAVENOUS

## 2020-11-19 MED ORDER — VITAMIN D 25 MCG (1000 UNIT) PO TABS
1000.0000 [IU] | ORAL_TABLET | Freq: Every day | ORAL | Status: DC
  Administered 2020-11-20 – 2020-11-24 (×5): 1000 [IU] via ORAL
  Filled 2020-11-19 (×5): qty 1

## 2020-11-19 MED ORDER — DOCUSATE SODIUM 100 MG PO CAPS
100.0000 mg | ORAL_CAPSULE | Freq: Two times a day (BID) | ORAL | Status: DC
  Administered 2020-11-19 – 2020-11-21 (×4): 100 mg via ORAL
  Filled 2020-11-19 (×7): qty 1

## 2020-11-19 MED ORDER — AMLODIPINE BESYLATE 10 MG PO TABS
10.0000 mg | ORAL_TABLET | Freq: Every day | ORAL | Status: DC
  Administered 2020-11-20 – 2020-11-24 (×5): 10 mg via ORAL
  Filled 2020-11-19 (×5): qty 1

## 2020-11-19 MED ORDER — LIDOCAINE HCL (PF) 2 % IJ SOLN
INTRAMUSCULAR | Status: AC
  Filled 2020-11-19: qty 5

## 2020-11-19 MED ORDER — NEOMYCIN-POLYMYXIN B GU 40-200000 IR SOLN
Status: DC | PRN
  Administered 2020-11-19: 16 mL

## 2020-11-19 MED ORDER — PHENYLEPHRINE HCL (PRESSORS) 10 MG/ML IV SOLN
INTRAVENOUS | Status: DC | PRN
  Administered 2020-11-19 (×3): 200 ug via INTRAVENOUS

## 2020-11-19 MED ORDER — CITALOPRAM HYDROBROMIDE 20 MG PO TABS
40.0000 mg | ORAL_TABLET | Freq: Every day | ORAL | Status: DC
  Administered 2020-11-20 – 2020-11-24 (×5): 40 mg via ORAL
  Filled 2020-11-19 (×5): qty 2

## 2020-11-19 MED ORDER — PROPOFOL 10 MG/ML IV BOLUS
INTRAVENOUS | Status: AC
  Filled 2020-11-19: qty 20

## 2020-11-19 MED ORDER — TRANEXAMIC ACID-NACL 1000-0.7 MG/100ML-% IV SOLN: 1000.0000 mg | Freq: Once | INTRAVENOUS | Status: AC

## 2020-11-19 MED ORDER — BISACODYL 10 MG RE SUPP: 10.0000 mg | Freq: Every day | RECTAL | Status: DC | PRN

## 2020-11-19 MED ORDER — ACETAMINOPHEN 10 MG/ML IV SOLN
INTRAVENOUS | Status: DC | PRN
  Administered 2020-11-19: 1000 mg via INTRAVENOUS

## 2020-11-19 MED ORDER — DIPHENHYDRAMINE HCL 12.5 MG/5ML PO ELIX
12.5000 mg | ORAL_SOLUTION | ORAL | Status: DC | PRN
  Filled 2020-11-19: qty 10

## 2020-11-19 MED ORDER — VANCOMYCIN HCL 1000 MG IV SOLR
INTRAVENOUS | Status: AC
  Filled 2020-11-19: qty 1000

## 2020-11-19 SURGICAL SUPPLY — 87 items
AUG BASEPLATE 15DEG 25 WEDGE (Joint) ×2 IMPLANT
AUGMENT BASEPLATE 15DEG 25 WDG (Joint) IMPLANT
BLADE SAGITTAL WIDE XTHICK NO (BLADE) ×2 IMPLANT
CANISTER SUCT 1200ML W/VALVE (MISCELLANEOUS) ×2 IMPLANT
CANISTER SUCT 3000ML PPV (MISCELLANEOUS) ×2 IMPLANT
CHLORAPREP W/TINT 26 (MISCELLANEOUS) ×2 IMPLANT
CNTNR SPEC 2.5X3XGRAD LEK (MISCELLANEOUS) ×1
CONT SPEC 4OZ STER OR WHT (MISCELLANEOUS) ×1
CONTAINER SPEC 2.5X3XGRAD LEK (MISCELLANEOUS) ×1 IMPLANT
COOLER POLAR GLACIER W/PUMP (MISCELLANEOUS) ×2 IMPLANT
COVER BACK TABLE REUSABLE LG (DRAPES) ×2 IMPLANT
COVER WAND RF STERILE (DRAPES) ×2 IMPLANT
DRAPE 3/4 80X56 (DRAPES) ×4 IMPLANT
DRAPE IMP U-DRAPE 54X76 (DRAPES) ×4 IMPLANT
DRAPE INCISE IOBAN 66X45 STRL (DRAPES) ×4 IMPLANT
DRAPE U-SHAPE 47X51 STRL (DRAPES) ×2 IMPLANT
DRSG ADAPTIC 3X8 NADH LF (GAUZE/BANDAGES/DRESSINGS) ×1 IMPLANT
DRSG OPSITE POSTOP 3X4 (GAUZE/BANDAGES/DRESSINGS) ×1 IMPLANT
DRSG OPSITE POSTOP 4X10 (GAUZE/BANDAGES/DRESSINGS) IMPLANT
DRSG OPSITE POSTOP 4X6 (GAUZE/BANDAGES/DRESSINGS) ×1 IMPLANT
DRSG OPSITE POSTOP 4X8 (GAUZE/BANDAGES/DRESSINGS) ×2 IMPLANT
DRSG TEGADERM 2-3/8X2-3/4 SM (GAUZE/BANDAGES/DRESSINGS) ×4 IMPLANT
DRSG TEGADERM 4X10 (GAUZE/BANDAGES/DRESSINGS) ×3 IMPLANT
ELECT REM PT RETURN 9FT ADLT (ELECTROSURGICAL) ×2
ELECTRODE REM PT RTRN 9FT ADLT (ELECTROSURGICAL) ×1 IMPLANT
GAUZE 4X4 16PLY RFD (DISPOSABLE) ×1 IMPLANT
GAUZE XEROFORM 1X8 LF (GAUZE/BANDAGES/DRESSINGS) ×1 IMPLANT
GLENOSPHERE STANDARD 39 (Joint) ×2 IMPLANT
GLENOSPHERE STD 39 (Joint) IMPLANT
GLOVE SRG 8 PF TXTR STRL LF DI (GLOVE) ×2 IMPLANT
GLOVE SURG ORTHO LTX SZ8 (GLOVE) ×4 IMPLANT
GLOVE SURG SYN 8.0 (GLOVE) ×2 IMPLANT
GLOVE SURG SYN 8.0 PF PI (GLOVE) ×1 IMPLANT
GLOVE SURG UNDER POLY LF SZ8 (GLOVE) ×2
GOWN STRL REUS W/ TWL LRG LVL3 (GOWN DISPOSABLE) ×2 IMPLANT
GOWN STRL REUS W/ TWL XL LVL3 (GOWN DISPOSABLE) ×1 IMPLANT
GOWN STRL REUS W/TWL LRG LVL3 (GOWN DISPOSABLE) ×2
GOWN STRL REUS W/TWL XL LVL3 (GOWN DISPOSABLE) ×1
GUIDE GLENOID PATIENT (MISCELLANEOUS) ×1 IMPLANT
GUIDEWIRE GLENOID 2.5X220 (WIRE) ×1 IMPLANT
HEMOVAC 400CC 10FR (MISCELLANEOUS) ×2 IMPLANT
HOOD PEEL AWAY FLYTE STAYCOOL (MISCELLANEOUS) ×6 IMPLANT
INSERT REV KIT SHOULDER 6X39 (Screw) ×1 IMPLANT
KIT STABILIZATION SHOULDER (MISCELLANEOUS) ×2 IMPLANT
MANIFOLD NEPTUNE II (INSTRUMENTS) ×2 IMPLANT
MASK FACE SPIDER DISP (MASK) ×2 IMPLANT
MAT ABSORB  FLUID 56X50 GRAY (MISCELLANEOUS) ×2
MAT ABSORB FLUID 56X50 GRAY (MISCELLANEOUS) ×2 IMPLANT
NDL REVERSE CUT 1/2 CRC (NEEDLE) ×1 IMPLANT
NDL SPNL 20GX3.5 QUINCKE YW (NEEDLE) ×1 IMPLANT
NEEDLE REVERSE CUT 1/2 CRC (NEEDLE) ×2 IMPLANT
NEEDLE SPNL 20GX3.5 QUINCKE YW (NEEDLE) ×2 IMPLANT
NS IRRIG 1000ML POUR BTL (IV SOLUTION) ×2 IMPLANT
PACK ARTHROSCOPY SHOULDER (MISCELLANEOUS) ×2 IMPLANT
PAD WRAPON POLAR SHDR XLG (MISCELLANEOUS) ×1 IMPLANT
PASSER SUT SWANSON 36MM LOOP (INSTRUMENTS) IMPLANT
PENCIL SMOKE EVACUATOR (MISCELLANEOUS) ×2 IMPLANT
PULSAVAC PLUS IRRIG FAN TIP (DISPOSABLE) ×2
RETRIEVER SUT HEWSON (MISCELLANEOUS) IMPLANT
SCREW 5.0X18 (Screw) ×1 IMPLANT
SCREW 5.0X38 SMALL F/PERFORM (Screw) ×1 IMPLANT
SCREW 5.5X22 (Screw) ×1 IMPLANT
SCREW BONE THREAD 6.5X35 (Screw) ×1 IMPLANT
SCREW PERIPHERAL 5.0X46 (Screw) ×1 IMPLANT
SLING ULTRA II LG (MISCELLANEOUS) ×1 IMPLANT
SLING ULTRA II M (MISCELLANEOUS) ×1 IMPLANT
SPONGE GAUZE 2X2 8PLY STRL LF (GAUZE/BANDAGES/DRESSINGS) IMPLANT
SPONGE LAP 18X18 RF (DISPOSABLE) ×8 IMPLANT
STAPLER SKIN PROX 35W (STAPLE) ×1 IMPLANT
STEM HUMERAL 3B LONG 98 (Stem) IMPLANT
STEM HUMERAL SZ 3B LONG 98MM (Stem) ×1 IMPLANT
STRAP SAFETY 5IN WIDE (MISCELLANEOUS) ×4 IMPLANT
STRIP CLOSURE SKIN 1/2X4 (GAUZE/BANDAGES/DRESSINGS) ×2 IMPLANT
SUT BONE WAX W31G (SUTURE) ×1 IMPLANT
SUT FIBERWIRE #2 38 BLUE 1/2 (SUTURE) ×8
SUT MNCRL AB 4-0 PS2 18 (SUTURE) IMPLANT
SUT PROLENE 6 0 P 1 18 (SUTURE) ×2 IMPLANT
SUT TICRON 2-0 30IN 311381 (SUTURE) ×6 IMPLANT
SUT VIC AB 0 CT1 36 (SUTURE) ×2 IMPLANT
SUT VIC AB 2-0 CT2 27 (SUTURE) ×4 IMPLANT
SUTURE FIBERWR #2 38 BLUE 1/2 (SUTURE) ×4 IMPLANT
SYR 20ML LL LF (SYRINGE) ×2 IMPLANT
SYR 30ML LL (SYRINGE) ×5 IMPLANT
TIP FAN IRRIG PULSAVAC PLUS (DISPOSABLE) ×1 IMPLANT
TRAY FOLEY SLVR 16FR LF STAT (SET/KITS/TRAYS/PACK) IMPLANT
TRAY SHOULDER REV OFFSET 1.5 (Joint) ×1 IMPLANT
WRAPON POLAR PAD SHDR XLG (MISCELLANEOUS) ×2

## 2020-11-19 NOTE — Progress Notes (Signed)
Pt admitted to 219 from the PACU s/p L Reverse Shoulder Arthroplasty.  VSS on adm and opt without complaints.  Pt's wife present at this time. No concerns or complaints.

## 2020-11-19 NOTE — Op Note (Signed)
Operative Note    SURGERY DATE: 11/19/2020   PRE-OP DIAGNOSIS:  1.  Left shoulder rotator cuff arthropathy   POST-OP DIAGNOSIS:  1.  Left shoulder rotator cuff arthropathy  PROCEDURES:  1. Left reverse total shoulder arthroplasty    SURGEON: Cato Mulligan, MD   ASSISTANT: Anitra Lauth, PA  ANESTHESIA: Gen + interscalene block   ESTIMATED BLOOD LOSS: 200cc   TOTAL IV FLUIDS: See anesthesia record  IMPLANTS: Tornier: Perform plus reversed full wedge augment baseplate 25 degree with 6.5 x29mm central screw; superior compression screw, posterior locking screw, inferior locking screw, and anterior locking screw to glenoid baseplate; 39 mm, standard glenosphere; size 3 humeral stem; standard reversed tray; standard reversed insert +6 mm   INDICATION(S):  James Arnold is a 65 y.o. male with chronic shoulder pain and weakness.  Imaging consistent with massive rotator cuff tear and superior migration of the humeral head.  Conservative measures including medications activity modifications, and cortisone injections have not provided adequate relief. After discussion of risks, benefits, and alternatives to surgery, the patient elected to proceed with reverse shoulder arthroplasty.  Of note the patient is an active smoker, and he underwent preoperative smoking cessation counseling with a formal written plan with medical management to help him assist with smoking cessation.   OPERATIVE FINDINGS: Massive rotator cuff tear of the supraspinatus, infraspinatus, and subscapularis with retraction to the level of the glenoid.  Additionally there was a previous proximal biceps tendon rupture.    OPERATIVE REPORT:   I identified WESSLEY EMERT in the pre-operative holding area. Informed consent was obtained and the surgical site was marked. I reviewed the risks and benefits of the proposed surgical intervention and the patient (and/or patient's guardian) wished to proceed. An interscalene block with  Exparel was administered by the Anesthesia team. The patient was transferred to the operative suite and general anesthesia was administered. The patient was placed in the beach chair position with the head of the bed elevated approximately 45 degrees. All down side pressure points were appropriately padded. Pre-op exam under anesthesia confirmed some stiffness and crepitus. Appropriate IV antibiotics were administered. Tranexamic acid was also administered after verifying that the patient had no contraindications. The extremity was then prepped and draped in standard fashion. A time out was performed confirming the correct extremity, correct patient, and correct procedure.   We used the standard deltopectoral incision from the coracoid to ~12cm distal. We found the cephalic vein and took it laterally.  We opened the deltopectoral interval widely and placed retractors under the CA ligament in the subacromial space and under the deltoid tendon at its insertion. We then abducted and internally rotated the arm and released the underlying bursa between these retractors, taking care not to damage the circumflex branch of the axillary nerve.   Next, we brought the arm back in adduction at slight forward flexion with external rotation. We opened the clavipectoral fascia lateral to the conjoint tendon. We gently palpated the axillary nerve and verified its position and continuity on both sides of the humerus with a Tug test. Note, this test was repeated multiple times during the procedure for nerve localization and confirmed to be intact at the end of the case. We then cauterized the anterior humeral circumflex ("Three sisters") vessels. The arm was then internally rotated, we cut the falciform ligament at approximately 1 cm of the upper portion of the pectoralis major insertion. Next we unroofed the bicipital groove.  The biceps tendon was not  visualized suggestive of prior rupture.  The anterior capsule and the  remnants of the subscapularis were released from the lesser tuberosity with electrocautery.  We released the inferior capsule from the humerus all the way to the posterior band of the inferior glenohumeral ligament. When this was complete we gently dislocated the shoulder up into the wound. We removed any osteophytes and made our cut with the appropriate inclination in 30 degrees of retroversion.   We then turned our attention to the glenoid. The proximal humerus was retracted posteriorly. The anterior capsule was dissected free from the remnant of the subscapularis. The anterior capsule was then excised, exposing the anterior glenoid. We then grasped the labrum and removed it circumferentially. During the glenoid exposure, the axillary nerve was protected the entire time.    A patient-specific guide was used to drill the central guidepin.  The high side was reamed until reaming marks were consistent with preoperative template.  The full wedge glenoid trial was placed and locked into place with the augment placed superiorly. Standard instrumentation was carried out.  The baseplate with 6.5 mm central screw was placed.  This allowed for excellent compression of the baseplate to the glenoid with appropriate seating. Next the superior compression screw was drilled and placed.  The remaining locking screws were then placed.  The peripheral reamer was used to ensure appropriate glenosphere seating.  Next, the glenosphere was impacted into place, and central screw of the glenosphere was tightened.  We then turned our attention back to the humerus.  The humeral canal was sequentially broached to the appropriate size.  The trial poly was placed and the humerus was reduced.  The shoulder was trialed and noted to have satisfactory stability, motion, and deltoid tension with a standard +6 mm poly.  Trial implants were removed.  The wound was thoroughly irrigated.  The final humeral component with humeral tray and insert  was then placed at 30 degrees of retroversion. The humerus was reduced and final confirmation of motion, tension, and stability were satisfactory. A Hemovac drain was placed.  The subscapularis was not repaired as it was of very poor quality.  We again verified the tension on the axillary nerve, appropriate range of motion, stability of the implant, and security of the subscapularis repair. We closed the deltopectoral interval a running, 0-Vicryl suture. The skin was closed with 2-0 Vicryl and staples. Xeroform and Honeycomb dressing was applied. A PolarCare unit and sling were placed. Patient was extubated, transferred to a stretcher bed and to the post antesthesia care unit in stable condition.   Of note, assistance from a PA was essential to performing the surgery.  PA was present for the entire surgery.  PA assisted with patient positioning, retraction, instrumentation, and wound closure. The surgery would have been more difficult and had longer operative time without PA assistance.    POSTOPERATIVE PLAN: The patient will be admitted with plan for discharge home on POD#1. Operative arm to remain in sling at all times except RoM exercises and hygiene. Can perform pendulums, elbow/wrist/hand RoM exercises. Passive RoM allowed to 90 FF and 30 ER. ASA 325mg  x 6 weeks for DVT ppx. Plan for outpatient PT starting on POD #3-4. Patient to return to clinic in ~2 weeks for post-operative appointment.

## 2020-11-19 NOTE — Anesthesia Postprocedure Evaluation (Signed)
Anesthesia Post Note  Patient: HARTFORD MAULDEN  Procedure(s) Performed: Left reverse shoulder arthroplasty (Left Shoulder)  Patient location during evaluation: PACU Anesthesia Type: Regional and General Level of consciousness: awake and alert Pain management: pain level controlled Vital Signs Assessment: post-procedure vital signs reviewed and stable Respiratory status: spontaneous breathing, nonlabored ventilation, respiratory function stable and patient connected to nasal cannula oxygen Cardiovascular status: blood pressure returned to baseline and stable Postop Assessment: no apparent nausea or vomiting Anesthetic complications: no   No complications documented.   Last Vitals:  Vitals:   11/19/20 1445 11/19/20 1525  BP:  111/64  Pulse:  74  Resp:  16  Temp: (!) 36.1 C (!) 36.3 C  SpO2:  96%    Last Pain:  Vitals:   11/19/20 1525  TempSrc: Oral  PainSc:                  Martha Clan

## 2020-11-19 NOTE — Anesthesia Procedure Notes (Signed)
Anesthesia Regional Block: Interscalene brachial plexus block   Pre-Anesthetic Checklist: ,, timeout performed, Correct Patient, Correct Site, Correct Laterality, Correct Procedure, Correct Position, site marked, Risks and benefits discussed,  Surgical consent,  Pre-op evaluation,  At surgeon's request and post-op pain management  Laterality: Left  Prep: chloraprep       Needles:  Injection technique: Single-shot  Needle Type: Stimiplex     Needle Length: 10cm  Needle Gauge: 21     Additional Needles:   Procedures:,,,, ultrasound used (permanent image in chart),,,,  Narrative:  Start time: 11/19/2020 7:15 AM End time: 11/19/2020 7:24 AM Injection made incrementally with aspirations every 5 mL.  Performed by: Personally  Anesthesiologist: Emmie Niemann, MD  Additional Notes: Functioning IV was confirmed and monitors were applied.  A Stimuplex needle was used. Sterile prep and drape,hand hygiene and sterile gloves were used.  Negative aspiration and negative test dose prior to incremental administration of local anesthetic. The patient tolerated the procedure well.

## 2020-11-19 NOTE — Anesthesia Procedure Notes (Signed)
Procedure Name: Intubation Date/Time: 11/19/2020 7:38 AM Performed by: Rolla Plate, CRNA Pre-anesthesia Checklist: Patient identified, Patient being monitored, Timeout performed, Emergency Drugs available and Suction available Patient Re-evaluated:Patient Re-evaluated prior to induction Oxygen Delivery Method: Circle system utilized Preoxygenation: Pre-oxygenation with 100% oxygen Induction Type: IV induction Ventilation: Mask ventilation without difficulty Laryngoscope Size: Mac and 3 Grade View: Grade I Tube type: Oral Tube size: 7.5 mm Number of attempts: 1 Airway Equipment and Method: Stylet Placement Confirmation: ETT inserted through vocal cords under direct vision,  positive ETCO2 and breath sounds checked- equal and bilateral Secured at: 23 cm Tube secured with: Tape Dental Injury: Teeth and Oropharynx as per pre-operative assessment

## 2020-11-19 NOTE — H&P (Signed)
Paper H&P to be scanned into permanent record. H&P reviewed. No significant changes noted.  

## 2020-11-19 NOTE — Anesthesia Preprocedure Evaluation (Signed)
Anesthesia Evaluation  Patient identified by MRN, date of birth, ID band Patient awake    Reviewed: Allergy & Precautions, NPO status , Patient's Chart, lab work & pertinent test results  History of Anesthesia Complications Negative for: history of anesthetic complications  Airway Mallampati: III  TM Distance: >3 FB Neck ROM: Full    Dental  (+) Edentulous Upper, Edentulous Lower   Pulmonary neg sleep apnea, COPD, Current Smoker and Patient abstained from smoking.,    breath sounds clear to auscultation- rhonchi (-) wheezing      Cardiovascular Exercise Tolerance: Good hypertension, Pt. on medications (-) angina+ CAD, + Past MI and + Cardiac Stents   Rhythm:Regular Rate:Normal - Systolic murmurs and - Diastolic murmurs    Neuro/Psych neg Seizures PSYCHIATRIC DISORDERS Depression negative neurological ROS     GI/Hepatic negative GI ROS, Neg liver ROS,   Endo/Other  negative endocrine ROSneg diabetes  Renal/GU ESRF and DialysisRenal disease (nightly peritoneal dialysis)     Musculoskeletal negative musculoskeletal ROS (+)   Abdominal (+) - obese,   Peds  Hematology  (+) anemia ,   Anesthesia Other Findings Past Medical History: No date: Anemia No date: Benign hypertensive renal disease No date: CAD (coronary artery disease)     Comment:  a. 08/1996 s/p BMS to the mLAD ([facility]); b. 12/2017 MV: EF               46%, fixed apical defect w/ significant GI uptake               artifact. No ischemia-> low risk. No date: CKD (chronic kidney disease), stage V ([place])     Comment:  on peritoneal dialysis No date: Complication of anesthesia     Comment:  please call by "[name]" when waking up!! No date: COPD (chronic obstructive pulmonary disease) ([facility])     Comment:  centrilobular emphysema. pt is poorly controlled with               his copd/smoking. No date: Depression No date: Diastolic dysfunction     Comment:  a.  12/2017 Echo: EF 60-65%, no rwma, Gr1 DD, mild AI/MR.               Nl RVSP. 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: FSGS (focal segmental glomerulosclerosis) 10/2002: Hyperlipidemia No date: Hypertension No date: myelofibrosis     Comment:  bone marrow failure 2010: Myelofibrosis ([place])     Comment:  JAK2 (+) primary 1997: Myocardial infarction ([place])     Comment:  stent x 1 No date: Smoker No date: Thrombocytopenia ([place])   Reproductive/Obstetrics                             Anesthesia Physical Anesthesia Plan  ASA: III  Anesthesia Plan: General   Post-op Pain Management:  Regional for Post-op pain   Induction: Intravenous  PONV Risk Score and Plan: 0 and Ondansetron  Airway Management Planned: Oral ETT  Additional Equipment:   Intra-op Plan:   Post-operative Plan: Extubation in OR  Informed Consent: I have reviewed the patients History and Physical, chart, labs and discussed the procedure including the risks, benefits and alternatives for the proposed anesthesia with the patient or authorized representative who has indicated his/her understanding and acceptance.     Dental advisory given  Plan Discussed with: CRNA and Anesthesiologist  Anesthesia Plan Comments:  Anesthesia Quick Evaluation  

## 2020-11-19 NOTE — Transfer of Care (Signed)
Immediate Anesthesia Transfer of Care Note  Patient: James Arnold  Procedure(s) Performed: Left reverse shoulder arthroplasty (Left Shoulder)  Patient Location: PACU  Anesthesia Type:General and Regional post op pain  Level of Consciousness: sedated  Airway & Oxygen Therapy: Patient Spontanous Breathing and Patient connected to face mask oxygen  Post-op Assessment: Report given to RN and Post -op Vital signs reviewed and stable  Post vital signs: Reviewed  Last Vitals:  Vitals Value Taken Time  BP    Temp    Pulse 56 11/19/20 1106  Resp    SpO2 93 % 11/19/20 1106  Vitals shown include unvalidated device data.  Last Pain:  Vitals:   11/19/20 0614  PainSc: 7          Complications: No complications documented.

## 2020-11-19 NOTE — Evaluation (Signed)
Physical Therapy Evaluation Patient Details Name: James Arnold MRN: [id number] DOB: 1955/10/09 Today's Date: 11/19/2020   History of Present Illness  Pt is a 65 yo male s/p L reverse TSA. PMH of smoking, COPD, past MI, cardiac stents, depression, nightly peritoneal dialysis.    Clinical Impression  Pt A&Ox4, finishing lunch. Denied pain, reported nerve block still functioning. Per pt at baseline he is independent, and his wife will be available to assist 24/7 if needed. Denied any recent falls.  The patient and PT spent time on education (polar care, expectations during stay, PT role, sling, sleep position). Pt verbalized understanding but would benefit from reinforcement. Supine to sit with HOB elevated and supervision. Cued for techniques to minimize strain on shoulder. Sit <> stand with handheld assist and CGA. Able to take a few steps forwards/backwards with CGA. Did exhibit some mild unsteadiness but no physical assist needed. Unable to ambulate further at this time due to IV attached to bed, and RN at bedside informing pt of obtaining a room. Returned to supine with all needs in reach.  Overall the patient demonstrated deficits (see "PT Problem List") that impede the patient's functional abilities, safety, and mobility and would benefit from skilled PT intervention. Recommendation is to follow surgeons plan of care for follow up therapies (HHPT versus outpt).      Follow Up Recommendations Follow surgeon's recommendation for DC plan and follow-up therapies    Equipment Recommendations  None recommended by PT    Recommendations for Other Services       Precautions / Restrictions Precautions Precautions: Fall;Shoulder Shoulder Interventions: Shoulder sling/immobilizer;Shoulder abduction pillow;At all times;Off for dressing/bathing/exercises Precaution Booklet Issued: No Required Braces or Orthoses: Sling Restrictions Weight Bearing Restrictions: Yes LUE Weight Bearing: Non weight  bearing      Mobility  Bed Mobility Overal bed mobility: Needs Assistance Bed Mobility: Supine to Sit;Sit to Supine     Supine to sit: Supervision;HOB elevated Sit to supine: Supervision;HOB elevated   General bed mobility comments: cued for technique to protect L shoulder    Transfers Overall transfer level: Needs assistance Equipment used: 1 person hand held assist Transfers: Sit to/from Stand Sit to Stand: Min guard            Ambulation/Gait Ambulation/Gait assistance: Min guard   Assistive device: None       General Gait Details: pt able to take steps forwards/backwards at EOB several times. Unable to ambulate further due to lines/leads (IV not on moveable pole, attached to bed).  Stairs            Wheelchair Mobility    Modified Rankin (Stroke Patients Only)       Balance Overall balance assessment: Needs assistance Sitting-balance support: Feet supported Sitting balance-Leahy Scale: Good     Standing balance support: Single extremity supported Standing balance-Leahy Scale: Good Standing balance comment: some mild unsteadiness noted, pt endorsed feeling different than baseline but no physical assist needed                             Pertinent Vitals/Pain Pain Assessment: No/denies pain    Home Living Family/patient expects to be discharged to:: Private residence Living Arrangements: Spouse/significant other Available Help at Discharge: Family Type of Home: House Home Access: Level entry     Home Layout: One level Home Equipment: None      Prior Function Level of Independence: Independent  Hand Dominance   Dominant Hand: Right    Extremity/Trunk Assessment   Upper Extremity Assessment Upper Extremity Assessment: LUE deficits/detail;RUE deficits/detail RUE Deficits / Details: WFLs LUE Deficits / Details: NWB, in shoulder sling for entire session. nerve block still in place    Lower Extremity  Assessment Lower Extremity Assessment: RLE deficits/detail;LLE deficits/detail;Overall [facility] for tasks assessed RLE Deficits / Details: able to lift against gravity, move to EOB and march without assist LLE Deficits / Details: able to lift against gravity, move to EOB and march without assist       Communication   Communication: No difficulties  Cognition Arousal/Alertness: Awake/alert Behavior During Therapy: WFL for tasks assessed/performed Overall Cognitive Status: Within Functional Limits for tasks assessed                                        General Comments      Exercises     Assessment/Plan    PT Assessment Patient needs continued PT services  PT Problem List Decreased strength;Decreased mobility;Decreased range of motion;Decreased knowledge of precautions;Decreased activity tolerance;Pain       PT Treatment Interventions DME instruction;Therapeutic exercise;Gait training;Balance training;Stair training;Neuromuscular re-education;Functional mobility training;Therapeutic activities;Patient/family education    PT Goals (Current goals can be found in the Care Plan section)  Acute Rehab PT Goals Patient Stated Goal: to go home PT Goal Formulation: With patient Time For Goal Achievement: 12/03/20 Potential to Achieve Goals: Good    Frequency 7X/week   Barriers to discharge        Co-evaluation               AM-PAC PT "6 Clicks" Mobility  Outcome Measure Help needed turning from your back to your side while in a flat bed without using bedrails?: None Help needed moving from lying on your back to sitting on the side of a flat bed without using bedrails?: None Help needed moving to and from a bed to a chair (including a wheelchair)?: None Help needed standing up from a chair using your arms (e.g., wheelchair or bedside chair)?: None Help needed to walk in hospital room?: A Little Help needed climbing 3-5 steps with a railing? : A Little 6  Click Score: 22    End of Session Equipment Utilized During Treatment: Gait belt (sling) Activity Tolerance: Patient tolerated treatment well Patient left: in bed;with call bell/phone within reach;with bed alarm set;with nursing/sitter in room Nurse Communication: Mobility status PT Visit Diagnosis: Muscle weakness (generalized) (M62.81);Pain Pain - Right/Left: Left Pain - part of body: Shoulder    Time: 7829-5621 PT Time Calculation (min) (ACUTE ONLY): 20 min   Charges:   PT Evaluation $PT Eval Low Complexity: 1 Low PT Treatments $Therapeutic Activity: 8-22 mins        Lieutenant Diego PT, DPT 3:53 PM,11/19/20

## 2020-11-20 ENCOUNTER — Inpatient Hospital Stay: Payer: Medicare HMO

## 2020-11-20 DIAGNOSIS — Z96619 Presence of unspecified artificial shoulder joint: Secondary | ICD-10-CM

## 2020-11-20 DIAGNOSIS — M12812 Other specific arthropathies, not elsewhere classified, left shoulder: Secondary | ICD-10-CM | POA: Diagnosis not present

## 2020-11-20 DIAGNOSIS — R569 Unspecified convulsions: Secondary | ICD-10-CM

## 2020-11-20 DIAGNOSIS — Z96612 Presence of left artificial shoulder joint: Secondary | ICD-10-CM | POA: Diagnosis not present

## 2020-11-20 DIAGNOSIS — I63511 Cerebral infarction due to unspecified occlusion or stenosis of right middle cerebral artery: Secondary | ICD-10-CM

## 2020-11-20 DIAGNOSIS — Z87898 Personal history of other specified conditions: Secondary | ICD-10-CM

## 2020-11-20 DIAGNOSIS — D696 Thrombocytopenia, unspecified: Secondary | ICD-10-CM | POA: Diagnosis present

## 2020-11-20 DIAGNOSIS — Z992 Dependence on renal dialysis: Secondary | ICD-10-CM | POA: Diagnosis not present

## 2020-11-20 DIAGNOSIS — D72829 Elevated white blood cell count, unspecified: Secondary | ICD-10-CM | POA: Diagnosis present

## 2020-11-20 DIAGNOSIS — D631 Anemia in chronic kidney disease: Secondary | ICD-10-CM

## 2020-11-20 DIAGNOSIS — E875 Hyperkalemia: Secondary | ICD-10-CM | POA: Diagnosis present

## 2020-11-20 DIAGNOSIS — N186 End stage renal disease: Secondary | ICD-10-CM | POA: Diagnosis not present

## 2020-11-20 DIAGNOSIS — Z720 Tobacco use: Secondary | ICD-10-CM

## 2020-11-20 LAB — CBC
HCT: 25.6 % — ABNORMAL LOW (ref 39.0–52.0)
Hemoglobin: 8.6 g/dL — ABNORMAL LOW (ref 13.0–17.0)
MCH: 30.1 pg (ref 26.0–34.0)
MCHC: 33.6 g/dL (ref 30.0–36.0)
MCV: 89.5 fL (ref 80.0–100.0)
Platelets: 121 10*3/uL — ABNORMAL LOW (ref 150–400)
RBC: 2.86 MIL/uL — ABNORMAL LOW (ref 4.22–5.81)
RDW: 15.7 % — ABNORMAL HIGH (ref 11.5–15.5)
WBC: 11.4 10*3/uL — ABNORMAL HIGH (ref 4.0–10.5)
nRBC: 0 % (ref 0.0–0.2)

## 2020-11-20 LAB — COMPREHENSIVE METABOLIC PANEL
ALT: 8 U/L (ref 0–44)
AST: 13 U/L — ABNORMAL LOW (ref 15–41)
Albumin: 2.9 g/dL — ABNORMAL LOW (ref 3.5–5.0)
Alkaline Phosphatase: 45 U/L (ref 38–126)
Anion gap: 13 (ref 5–15)
BUN: 78 mg/dL — ABNORMAL HIGH (ref 8–23)
CO2: 18 mmol/L — ABNORMAL LOW (ref 22–32)
Calcium: 7.6 mg/dL — ABNORMAL LOW (ref 8.9–10.3)
Chloride: 103 mmol/L (ref 98–111)
Creatinine, Ser: 7.95 mg/dL — ABNORMAL HIGH (ref 0.61–1.24)
GFR, Estimated: 7 mL/min — ABNORMAL LOW (ref >60)
Glucose, Bld: 125 mg/dL — ABNORMAL HIGH (ref 70–99)
Potassium: 5.2 mmol/L — ABNORMAL HIGH (ref 3.5–5.1)
Sodium: 134 mmol/L — ABNORMAL LOW (ref 135–145)
Total Bilirubin: 0.6 mg/dL (ref 0.3–1.2)
Total Protein: 5.6 g/dL — ABNORMAL LOW (ref 6.5–8.1)

## 2020-11-20 LAB — GLUCOSE, CAPILLARY
Glucose-Capillary: 121 mg/dL — ABNORMAL HIGH (ref 70–99)
Glucose-Capillary: 126 mg/dL — ABNORMAL HIGH (ref 70–99)
Glucose-Capillary: 124 mg/dL — ABNORMAL HIGH (ref 70–99)

## 2020-11-20 LAB — MAGNESIUM: Magnesium: 2 mg/dL (ref 1.7–2.4)

## 2020-11-20 MED ORDER — SODIUM CHLORIDE 0.9 % IV SOLN: 20.0000 mg/kg | Freq: Once | INTRAVENOUS | Status: DC

## 2020-11-20 MED ORDER — SODIUM ZIRCONIUM CYCLOSILICATE 5 G PO PACK
5.0000 g | PACK | Freq: Once | ORAL | Status: AC
  Administered 2020-11-20: 5 g via ORAL
  Filled 2020-11-20: qty 1

## 2020-11-20 MED ORDER — DELFLEX-LC/1.5% DEXTROSE 344 MOSM/L IP SOLN
INTRAPERITONEAL | Status: DC
  Administered 2020-11-21 – 2020-11-23 (×2): 6000 mL via INTRAPERITONEAL
  Filled 2020-11-20 (×6): qty 3000

## 2020-11-20 MED ORDER — GENTAMICIN SULFATE 0.1 % EX CREA
1.0000 "application " | TOPICAL_CREAM | Freq: Every day | CUTANEOUS | Status: DC
  Administered 2020-11-22 – 2020-11-23 (×2): 1 via TOPICAL
  Filled 2020-11-20 (×2): qty 15

## 2020-11-20 MED ORDER — LORAZEPAM 2 MG/ML IJ SOLN
INTRAMUSCULAR | Status: AC
  Filled 2020-11-20: qty 1

## 2020-11-20 MED ORDER — LEVETIRACETAM 500 MG PO TABS: 500.0000 mg | ORAL_TABLET | Freq: Two times a day (BID) | ORAL | Status: DC

## 2020-11-20 MED ORDER — LORAZEPAM 2 MG/ML IJ SOLN
2.0000 mg | INTRAMUSCULAR | Status: AC
  Administered 2020-11-20: 2 mg via INTRAVENOUS

## 2020-11-20 MED ORDER — LEVETIRACETAM 500 MG PO TABS
500.0000 mg | ORAL_TABLET | ORAL | Status: DC
  Administered 2020-11-20 – 2020-11-23 (×4): 500 mg via ORAL
  Filled 2020-11-20 (×5): qty 1

## 2020-11-20 MED ORDER — CALCIUM GLUCONATE-NACL 1-0.675 GM/50ML-% IV SOLN
1.0000 g | Freq: Once | INTRAVENOUS | Status: AC
  Administered 2020-11-20: 1000 mg via INTRAVENOUS
  Filled 2020-11-20: qty 50

## 2020-11-20 MED ORDER — HEPARIN 1000 UNIT/ML FOR PERITONEAL DIALYSIS
500.0000 [IU] | INTRAMUSCULAR | Status: DC | PRN
  Filled 2020-11-20: qty 0.5

## 2020-11-20 MED ORDER — LORAZEPAM 2 MG/ML IJ SOLN: 2.0000 mg | INTRAMUSCULAR | Status: DC | PRN

## 2020-11-20 MED ORDER — HYDRALAZINE HCL 20 MG/ML IJ SOLN: 5.0000 mg | INTRAMUSCULAR | Status: DC | PRN

## 2020-11-20 MED ORDER — ONDANSETRON HCL 4 MG/2ML IJ SOLN: 4.0000 mg | Freq: Three times a day (TID) | INTRAMUSCULAR | Status: DC | PRN

## 2020-11-20 MED ORDER — LEVETIRACETAM IN NACL 1500 MG/100ML IV SOLN
1500.0000 mg | Freq: Once | INTRAVENOUS | Status: AC
  Administered 2020-11-20: 1500 mg via INTRAVENOUS
  Filled 2020-11-20: qty 100

## 2020-11-20 MED ORDER — ACETAMINOPHEN 325 MG PO TABS: 650.0000 mg | ORAL_TABLET | Freq: Four times a day (QID) | ORAL | Status: DC | PRN

## 2020-11-20 MED ORDER — CHLORHEXIDINE GLUCONATE CLOTH 2 % EX PADS
6.0000 | MEDICATED_PAD | Freq: Every day | CUTANEOUS | Status: DC
  Administered 2020-11-20 – 2020-11-22 (×3): 6 via TOPICAL

## 2020-11-20 NOTE — Consult Note (Signed)
NEUROLOGY CONSULTATION NOTE   Date of service: November 20, 2020 Patient Name: James Arnold MRN:  [id number] DOB:  [birth year] Reason for consult: "seizure" _ _ _   _ __   _ __ _ _  __ __   _ __   __ _  History of Present Illness  AH BOTT is a 65 y.o. 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 He was being monitored in the hospital overnight when he had a brief possible seizure with twitching and eyes rolled back. He was given 2mg  of IV ativan. He was back to his normal baseline neurologically post seizure. CTH was obtained and was negative.  Patient seen in the room with his wife present at the bedside.  They deny any prior history of seizures.  No family history of seizures, no significant EtOH intake, no prior head injury with loss of consciousness, no history of strokes, no ICH.  On discussion with wife, he has had about 5 similar episodes over the last 12 hours.  I was in the room and was able to witness 1 of these episodes.  Patient denies any aura. The episode always starts with left foot twitching, that progresses to involve his entire left leg within a few seconds and some twitching of his left fingers.  He is awake alert and able to answer questions and follow commands during the episode.  The episode self resolved in about 40 seconds. No post ictal somnolence. HE was a little weaker on the left after immediate evalution of his left leg strength after the episode. No tongue biting.  No loss of bladder or bowel control.  Of note, no new medications but was given Tramadol here. No significant electrolyte abnormalities, except for elevated Creatinine to 7.95. No signs or symptoms of an infection.   ROS   Constitutional Denies weight loss, fever and chills.  HEENT Denies changes in vision and hearing.  Respiratory Denies SOB and cough.  CV Denies  palpitations and CP  GI Denies abdominal pain, nausea, vomiting and diarrhea.   GU Denies dysuria and urinary frequency.  MSK Denies myalgia and joint pain.  Skin Denies rash and pruritus.  Neurological Denies headache and syncope.  Psychiatric Denies recent changes in mood. Denies anxiety and depression.   Past History   Past Medical History:  Diagnosis Date  . Anemia   . Benign hypertensive renal disease   . CAD (coronary artery disease)    a. 08/1996 s/p BMS to the mLAD ([facility]); b. 12/2017 MV: EF 46%, fixed apical defect w/ significant GI uptake artifact. No ischemia-> low risk.  . CKD (chronic kidney disease), stage V ([place])    on peritoneal dialysis  . Complication of anesthesia    please call by "[name]" when waking up!!  . COPD (chronic obstructive pulmonary disease) ([place])    centrilobular emphysema. pt is poorly controlled with his copd/smoking.  . Depression   . Diastolic dysfunction    a. 12/2017 Echo: EF 60-65%, no rwma, Gr1 DD, mild AI/MR. Nl RVSP.  [name] Kitchen Dyspnea    with exertion  . Fatigue   . FSGS (focal segmental glomerulosclerosis)   . Hyperlipidemia 10/2002  . Hypertension   . myelofibrosis    bone marrow failure  . Myelofibrosis ([place]) 2010   JAK2 (+) primary  . Myocardial infarction ([place]) 1997   stent x 1  .  Smoker   . Thrombocytopenia ([place])    Past Surgical History:  Procedure Laterality Date  . ANGIOPLASTY    . BONE MARROW ASPIRATION  03/2009  . CARDIAC CATHETERIZATION  1997   stent x 1  . COLONOSCOPY WITH PROPOFOL N/A 08/09/2018   Procedure: COLONOSCOPY WITH PROPOFOL;  Surgeon: [name], MD;  Location[facility] ENDOSCOPY;  Service: Gastroenterology;  Laterality: N/A;  . CORONARY STENT PLACEMENT  08/1996  . EXTERIORIZATION OF A CONTINUOUS AMBULATORY PERITONEAL DIALYSIS CATHETER    . EYE SURGERY Bilateral    cats removed  . REVERSE SHOULDER ARTHROPLASTY Left 11/19/2020   Procedure: Left reverse shoulder arthroplasty;  Surgeon: Leim Fabry, MD;  Location:  [facility] ORS;  Service: Orthopedics;  Laterality: Left;   Family History  Problem Relation Age of Onset  . Stroke Mother        Low BP stroke  . Depression Father   . Depression Sister        Breast  . Heart disease Other   . Breast cancer Other   . Lung cancer Other   . Ovarian cancer Other   . Stomach cancer Other    Social History   Socioeconomic History  . Marital status: Married    Spouse name: Jackelyn Poling  . Number of children: Not on file  . Years of education: Not on file  . Highest education level: Not on file  Occupational History  . 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 Smoking status: Current Every Day Smoker    Packs/day: 0.75    Years: 47.00    Pack years: 35.25    Types: Cigarettes  . Smokeless tobacco: Never Used  . Tobacco comment: trying to cut back  Vaping Use  . Vaping Use: Never used  Substance and Sexual Activity  . Alcohol use: Not Currently    Comment: rare  . Drug use: No  . Sexual activity: Yes  Other Topics Concern  . Not on file  Social History Narrative   Patient lives with wife in his home.   Not working dt disability.    Has been placed on kidney transplant evaluation list, but does not qualify d/t smoking and myelofibrosis   Social Determinants of Health   Financial Resource Strain: Not on file  Food Insecurity: Not on file  Transportation Needs: Not on file  Physical Activity: Not on file  Stress: Not on file  Social Connections: Not on file   No Known Allergies  Medications   Medications Prior to Admission  Medication Sig Dispense Refill Last Dose  . amLODipine (NORVASC) 10 MG tablet Take 1 tablet (10 mg total) by mouth daily. 90 tablet 1 11/19/2020 at Unknown time  . carvedilol (COREG) 12.5 MG tablet Take 1.5 tablets (18.75 mg total) by mouth 2 (two) times daily. 270 tablet 3 11/19/2020 at Unknown time  . Cholecalciferol (VITAMIN D-1000 MAX ST) 25 MCG (1000 UT) tablet Take 1,000 Units by  mouth daily.    11/18/2020 at Unknown time  . citalopram (CELEXA) 40 MG tablet TAKE 1 TABLET EVERY DAY (Patient taking differently: Take 40 mg by mouth daily.) 90 tablet 1 11/19/2020 at Unknown time  . epoetin alfa-epbx (RETACRIT) 99242 UNIT/ML injection Inject into the skin. Once a month at dialysis   11/18/2020 at Unknown time  . ferrous sulfate 325 (65 FE) MG tablet Take 1 tablet (325 mg total) by mouth daily with breakfast. 30 tablet 1 11/18/2020 at Unknown time  .  furosemide (LASIX) 20 MG tablet Take 1 tablet (20 mg total) by mouth daily. 90 tablet 1 11/18/2020 at Unknown time  . gentamicin cream (GARAMYCIN) 0.1 % Apply 1 application topically every other day.   11/18/2020 at Unknown time  . losartan (COZAAR) 50 MG tablet Take 1 tablet (50 mg total) by mouth daily. 90 tablet 1 11/18/2020 at Unknown time  . nicotine (NICODERM CQ) 14 mg/24hr patch Place 1 patch (14 mg total) onto the skin daily. 84 patch 0 11/18/2020 at Unknown time  . predniSONE (DELTASONE) 10 MG tablet Take 10 mg by mouth daily with breakfast.   11/19/2020 at Unknown time  . ruxolitinib phosphate (JAKAFI) 15 MG tablet Take 1 tablet (15 mg total) by mouth daily. Take AFTER dialysis. 30 tablet 4 11/18/2020 at Unknown time  . traMADol (ULTRAM) 50 MG tablet Take 50 mg by mouth every 12 (twelve) hours as needed for moderate pain.   11/19/2020 at Unknown time  . varenicline (CHANTIX CONTINUING MONTH PAK) 1 MG tablet Take 1 tablet (1 mg total) by mouth 2 (two) times daily. 30 tablet 2 11/18/2020 at Unknown time  . varenicline (CHANTIX STARTING MONTH PAK) 0.5 MG X 11 & 1 MG X 42 tablet Take one 0.5 mg tablet by mouth once daily for 3 days, then increase to one 0.5 mg tablet twice daily for 4 days, then increase to one 1 mg tablet twice daily. 53 tablet 0 11/18/2020 at Unknown time  . ondansetron (ZOFRAN ODT) 8 MG disintegrating tablet Take 1 tablet (8 mg total) by mouth every 8 (eight) hours as needed for nausea or vomiting. 30 tablet 1 Not Taking at  Unknown time     Vitals   Vitals:   11/20/20 0331 11/20/20 0500 11/20/20 0700 11/20/20 0800  BP: 132/69  (!) 155/91 (!) 152/82  Pulse: 91  99 (!) 57  Resp: 18  12 (!) 28  Temp:  98 F (36.7 C)    TempSrc:  Oral    SpO2:   95% 96%  Weight:      Height:         Body mass index is 26.04 kg/m.  Physical Exam   General: Laying comfortably in bed; in no acute distress.  HENT: Normal oropharynx and mucosa. Normal external appearance of ears and nose.  Neck: Supple, no pain or tenderness  CV: No JVD. No peripheral edema.  Pulmonary: Symmetric Chest rise. Normal respiratory effort.  Abdomen: Soft to touch, non-tender.  Ext: No cyanosis, edema, or deformity  Skin: No rash. Normal palpation of skin.   Musculoskeletal: Normal digits and nails by inspection. No clubbing.   Neurologic Examination  Mental status/Cognition: Alert, oriented to self, place, month and year, good attention.  Speech/language: Fluent, comprehension intact, object naming intact, repetition intact.  Cranial nerves:   CN II Pupils equal and reactive to light, no VF deficits    CN III,IV,VI EOM intact, no gaze preference or deviation, no nystagmus    CN V normal sensation in V1, V2, and V3 segments bilaterally    CN VII no asymmetry, no nasolabial fold flattening    CN VIII normal hearing to speech    CN IX & X normal palatal elevation, no uvular deviation    CN XI 5/5 head turn and 5/5 shoulder shrug bilaterally    CN XII midline tongue protrusion    Motor:  Muscle bulk: poor, tone normal, tremor: high frequency tremor noted in RUE. Mvmt Root Nerve  Muscle Right Left Comments  SA C5/6 Ax Deltoid 5  Left upper ext not examined due to surgery yesterday.  EF C5/6 Mc Biceps 5    EE C6/7/8 Rad Triceps 5    WF C6/7 Med FCR 5    WE C7/8 PIN ECU 5    F Ab C8/T1 U ADM/FDI 5 1   HF L1/2/3 Fem Illopsoas 5 4+   KE L2/3/4 Fem Quad 5 5   DF L4/5 D Peron Tib Ant 5 5   PF S1/2 Tibial Grc/Sol 5 5    Reflexes:   Right Left Comments  Pectoralis      Biceps (C5/6) 2 2   Brachioradialis (C5/6) 2 2    Triceps (C6/7) 2 2    Patellar (L3/4) 2 2    Achilles (S1) 1 1    Hoffman      Plantar     Jaw jerk    Sensation:  Light touch Intact throughout   Pin prick    Temperature    Vibration   Proprioception    Coordination/Complex Motor:  - Finger to Nose intact in RUE, not tested in LUE due to surgery. - Heel to shin unable to assess - Rapid alternating movement are slowed - Gait: deferred.  Labs   CBC:  Recent Labs  Lab 11/15/20 0820 11/19/20 0626 11/20/20 0423  WBC 7.4  --  11.4*  HGB 9.8* 9.5* 8.6*  HCT 29.3* 28.0* 25.6*  MCV 89.9  --  89.5  PLT 140*  --  121*    Basic Metabolic Panel:  Lab Results  Component Value Date   NA 134 (L) 11/20/2020   K 5.2 (H) 11/20/2020   CO2 18 (L) 11/20/2020   GLUCOSE 125 (H) 11/20/2020   BUN 78 (H) 11/20/2020   CREATININE 7.95 (H) 11/20/2020   CALCIUM 7.6 (L) 11/20/2020   GFRNONAA 7 (L) 11/20/2020   GFRAA 14 (L) 07/10/2020   Lipid Panel:  Lab Results  Component Value Date   LDLCALC 140 (H) 07/10/2020   HgbA1c: No results found for: HGBA1C Urine Drug Screen: No results found for: LABOPIA, COCAINSCRNUR, LABBENZ, AMPHETMU, THCU, LABBARB  Alcohol Level No results found for: [place]  CT Head without contrast: CTH was negative for a large hypodensity concerning for a large territory infarct or hyperdensity concerning for an ICH  MRI Brain pending  rEEG: pending.  Impression   NORRIN SHREFFLER is a 65 y.o. 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 I witnessed one of the events and noted following. Abrupt L foot rhythmic jerking > Entire left lower ext jerking in a few secs > jerking of his left fingers. Self resolved in about 40 secs. No loss of  awareness during the event and engages in a conversation during the event. Neuro exam immediately afterwards, demonstrated mild LLE weakness that improved. The event was clinically concerning for a focal seizure with retained awareness.  Recommendations  - I ordered Keppra 20mg /Kg IV once - I ordered Keppra 500mg  BID. - I ordered rEEG - Will follow up on MRI Brain without contrast - Recommend alternative to Tramadol. - Agree with peritoneal dialysis per Nephrology. ______________________________________________________________________   Thank you for the opportunity to take part in the care of this patient. If you have any further questions, please contact the neurology consultation attending.  Signed,  Alferd Patee  Lorrin Goodell [facility] Pager Number [phone] _ _ _   _ __   _ __ _ _  __ __   _ __   __ _

## 2020-11-20 NOTE — Progress Notes (Signed)
[place] Progress Note Patient Name: James Arnold DOB: 1956/04/07 MRN: [id number]   Date of Service  11/20/2020  HPI/Events of Note  49 M smoker on varenicline, CAD s/p stent, hypertension, dyslipidemia, mylefibrosis, ESRD on HD underwent total shoulder arthroplasty for left rotator cuff arthropathy under general anesthesia and interscalene block. No complications noted.  Patient now transferred to the ICU after rapid response called for seizure like activity. Head CT with remote right frontal and parietal cortical infarcts.  Corrected calcium 7.8  eICU Interventions  New onset seizures. Noted that patient is on varenicline. Reviewed with pharmacy and patient had prescription filled 2/17. May ned to hold. Continue seizure precaution  Ordered calcium gluconate     Intervention Category Major Interventions: Seizures - evaluation and management  Judd Lien 11/20/2020, 5:31 AM

## 2020-11-20 NOTE — Progress Notes (Signed)
PT Cancellation Note  Patient Details Name: James Arnold MRN: [id number] DOB: 03/26/1956   Cancelled Treatment:    Reason Eval/Treat Not Completed: Other (comment). Patient transferred to higher level of care, PT to sign off, please re-consult PT when patient is medically appropriate.   Lieutenant Diego PT, DPT 9:12 AM,11/20/20

## 2020-11-20 NOTE — Progress Notes (Signed)
Patient has not voided. Bladder scanned over 999. Per Dr Posey Pronto orders for in and out cath.

## 2020-11-20 NOTE — Progress Notes (Signed)
Patient is now postop day #1 after undergoing left reverse shoulder arthroplasty.  The patient was doing relatively well as of yesterday afternoon.  However, per report from the nursing staff, around dinnertime, the patient noted some twitching/spasming about his left paraspinal musculature as well as left lower extremity.  He was given Robaxin at that time.  Around 2:30 AM, nursing staff noted significant left lower extremity shaking that was visible from outside the room.  On further assessment, the patient developed whole-body seizure-like activity with notice of the patient's eyes rolling back for a brief period of time. He was immediately treated with Ativan.  Per report from nursing staff and rapid response nurse that was called at the time, there was no definitive post ictal phase.  A CT scan of the head was obtained, and this shows remote infarct without any acute findings.  The patient has not had any seizure-like activity previously.  He was transferred to the stepdown unit for further evaluation.  This morning, the patient continues to have involuntary twitching/spasms of the paraspinal musculature that are painless.  The patient has a history of end-stage renal disease with daily peritoneal dialysis.  The patient states that he was instructed that he could skip dialysis yesterday as initial plan was for discharge today.  However given the above events, I have informed his nephrologist, Dr. Holley Raring, via chat message regarding this.  Additionally, I have consulted the hospitalist team as well as the neurology team for further evaluation.  Updated radiographs of the left shoulder from this morning do not show any dislocation, fractures, or other notable complications from seizure-like activity.  Disposition pending appropriate work-up and evaluation by consulting teams.

## 2020-11-20 NOTE — Plan of Care (Signed)
James Arnold Pt just had a couple of series of neck twitching to the L side last evening. On RA. L arm still numb per pt report. A couple of hours later, L leg twitching noted as well. MD notified and prn Robaxin po adm. Prn pain med administered as well.   0330 CRT called for NT staff report seeing generalized body tremors/seizure like activities with vomitting noted. Prn Zofran adm then Ativan 2mg  IV administered due to pt having further seizure like activities. L neck twitching noted, then L leg twitching/tremors followed by generalized muscle twtching noted. Heavy breaving noted with increased HR. [place] 2L administered. Vitals monitored closely. No loss of concsiousness noted during the seizure-like activities no postictal state noted. Pt able to talk through all these event. Pt transferred safely to CT on the monitor accompanied by ICU RN then admitted to stepdown unit. Safety measures in place. Will contiue to m  Problem: Education: Goal: Knowledge of General Education information will improve Description: Including pain rating scale, medication(s)/side effects and non-pharmacologic comfort measures Outcome: Progressing   Problem: Health Behavior/Discharge Planning: Goal: Ability to manage health-related needs will improve Outcome: Progressing   Problem: Clinical Measurements: Goal: Ability to maintain clinical measurements within normal limits will improve Outcome: Progressing Goal: Will remain free from infection Outcome: Progressing Goal: Diagnostic test results will improve Outcome: Progressing Goal: Respiratory complications will improve Outcome: Progressing Goal: Cardiovascular complication will be avoided Outcome: Progressing   Problem: Nutrition: Goal: Adequate nutrition will be maintained Outcome: Progressing   Problem: Coping: Goal: Level of anxiety will decrease Outcome: Progressing   Problem: Elimination: Goal: Will not experience complications related to bowel  motility Outcome: Progressing Goal: Will not experience complications related to urinary retention Outcome: Progressing   Problem: Pain Managment: Goal: General experience of comfort will improve Outcome: Progressing   Problem: Safety: Goal: Ability to remain free from injury will improve Outcome: Progressing

## 2020-11-20 NOTE — Progress Notes (Signed)
OT Cancellation Note  Patient Details Name: FILIMON MIRANDA MRN: [id number] DOB: 12/05/1955   Cancelled Treatment:    Reason Eval/Treat Not Completed: Patient not medically ready. Order received and chart reviewed. Per chart review, pt observed to have had seizure-like activity this AM, and has transitioned to higher level care. Per therapy protocols, will require new orders or continue at transfer to initiate therapy services. Will follow acutely and initiate services as pt medically appropriate.   Fredirick Maudlin, OTR/L [place]

## 2020-11-20 NOTE — Progress Notes (Signed)
  Subjective: 1 Day Post-Op Procedure(s) (LRB): Left reverse shoulder arthroplasty (Left) Patient reports pain as moderate.   Patient is having problems with seizure and tremor activity Plan is to go Home after hospital stay. Negative for chest pain and shortness of breath Fever: no Gastrointestinal:negative for nausea and vomiting  Objective: Vital signs in last 24 hours: Temp:  [97 F (36.1 C)-98.2 F (36.8 C)] 98 F (36.7 C) (03/01 0500) Pulse Rate:  [56-122] 91 (03/01 0331) Resp:  [9-22] 18 (03/01 0331) BP: (86-178)/(46-92) 132/69 (03/01 0331) SpO2:  [90 %-99 %] 99 % (03/01 0316) FiO2 (%):  [28 %] 28 % (02/28 1302)  Intake/Output from previous day:  Intake/Output Summary (Last 24 hours) at 11/20/2020 0711 Last data filed at 11/19/2020 1900 Gross per 24 hour  Intake 850 ml  Output 130 ml  Net 720 ml    Intake/Output this shift: No intake/output data recorded.  Labs: Recent Labs    11/19/20 0626 11/20/20 0423  HGB 9.5* 8.6*   Recent Labs    11/19/20 0626 11/20/20 0423  WBC  --  11.4*  RBC  --  2.86*  HCT 28.0* 25.6*  PLT  --  121*   Recent Labs    11/19/20 0626 11/20/20 0423  NA 137 134*  K 4.6 5.2*  CL 105 103  CO2  --  18*  BUN 71* 78*  CREATININE 7.30* 7.95*  GLUCOSE 100* 125*  CALCIUM  --  7.6*   No results for input(s): LABPT, INR in the last 72 hours.   EXAM General - Patient is Alert and Oriented Extremity - Neurovascular intact Sensation intact distally Dorsiflexion/Plantar flexion intact Dressing/Incision - clean, dry, with the hemovac removed.  The shoulder immobilizer is in place. Motor Function - intact, moving fingers well on exam.   Past Medical History:  Diagnosis Date  . Anemia   . Benign hypertensive renal disease   . CAD (coronary artery disease)    a. 08/1996 s/p BMS to the mLAD ([facility]); b. 12/2017 MV: EF 46%, fixed apical defect w/ significant GI uptake artifact. No ischemia-> low risk.  . CKD (chronic kidney disease),  stage V ([place])    on peritoneal dialysis  . Complication of anesthesia    please call by "[name]" when waking up!!  . COPD (chronic obstructive pulmonary disease) ([place])    centrilobular emphysema. pt is poorly controlled with his copd/smoking.  . Depression   . Diastolic dysfunction    a. 12/2017 Echo: EF 60-65%, no rwma, Gr1 DD, mild AI/MR. Nl RVSP.  [name] Kitchen Dyspnea    with exertion  . Fatigue   . FSGS (focal segmental glomerulosclerosis)   . Hyperlipidemia 10/2002  . Hypertension   . myelofibrosis    bone marrow failure  . 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 Myocardial infarction ([place]) 1997   stent x 1  . Smoker   . Thrombocytopenia (HCC)     Assessment/Plan: 1 Day Post-Op Procedure(s) (LRB): Left reverse shoulder arthroplasty (Left) Active Problems:   Rotator cuff arthropathy  Estimated body mass index is 26.04 kg/m as calculated from the following:   Height as of this encounter: 5' 6.34" (1.685 m).   Weight as of this encounter: 73.9 kg. Advance diet Up with therapy  Appreciate Hospitalist assistance.  Await Neurology assessment.    DVT Prophylaxis - Aspirin   Reche Dixon, PA-C Orthopaedic Surgery 11/20/2020, 7:11 AM

## 2020-11-20 NOTE — Procedures (Signed)
Patient Name: James Arnold  MRN: [id number]  Epilepsy Attending: Lora Havens  Referring Physician/Provider: Dr Donnetta Simpers Date: 11/20/2020 Duration: 26.21 mins  Patient history: 65 y.o. 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 Self resolved in about 40 secs. EEG to evaluate for seizure  Level of alertness: Awake  AEDs during EEG study: LEV  Technical aspects: This EEG study was done with scalp electrodes positioned according to the 10-20 International system of electrode placement. Electrical activity was acquired at a sampling rate of 500Hz  and reviewed with a high frequency filter of 70Hz  and a low frequency filter of 1Hz . EEG data were recorded continuously and digitally stored.   Description: The posterior dominant rhythm consists of 7.5 Hz activity of moderate voltage (25-35 uV) seen predominantly in posterior head regions, symmetric and reactive to eye opening and eye closing. Hyperventilation and photic stimulation were not performed.     IMPRESSION: This study is within normal limits. No seizures or epileptiform discharges were seen throughout the recording.  Laurali Goddard Barbra Sarks

## 2020-11-20 NOTE — Progress Notes (Addendum)
Rapid Response to New onset Seizure S/p Left reverse total shoulder arthroplasty  Seiazure like activity total duration  Per rapid response nurse Patient Alert and oriented on arrival Patient Vital's: BP 161/87 HR 106 O2 98 on 2L Temp 97.4 Patient rates a pain in his left ar of 4 /10 Patient described muscles twitching and vomiting. Staff says they observed the twitching and that his eyes rolled back  Patient had possible seizure activity during rapid  Patient back to normal baseline neurologically post seziure  I arrived to room and seen tonic clonic movements mostly in right side involving whole right side of body including head - abated with 2 mg ativan.  Patient did not appear to have classic post ictal state, was able to talk. Neuro exam in tact  Discussed with Dr. Rudene Christians and Dr Posey Pronto from ortho. Hospitalist and neuro consult planned  Problem List As of 11/16/2020 9:04 AM per [place] history per patient - daily smoker Occasional  drinker, 1 - 12 pack of beer lasts over a month    Cardiovascular and Mediastinum   CAD (coronary artery disease)   Senile purpura ([place])   Hypertensive heart and chronic kidney disease with heart failure and with stage 5 chronic kidney disease, or end stage renal disease (HCC)      Respiratory   Centrilobular emphysema (HCC)      Endocrine   Secondary hyperparathyroidism ([place])      Genitourinary   Benign hypertensive renal disease   CKD (chronic kidney disease) stage 5, GFR less than 15 ml/min (HCC)   Anemia of chronic kidney failure, stage 5 (HCC)   FSGS (focal segmental glomerulosclerosis)   BPH (benign prostatic hyperplasia)      Other   Primary myelofibrosis (HCC)   Hyperlipidemia   Major depression, recurrent (HCC)   Gout   Proteinuria   Dialysis patient ([place]) - peritoneal every day except satudays   Tobacco abuse   Depression  Med list   Acetaminophen 1,000 mg Oral Every 6  hours PRN   amLODIPine Besylate 10 mg Oral Daily   Aspirin 81 mg Oral Daily   Carvedilol 18.75 mg Oral 2 times daily   Cholecalciferol 1,000 Units Oral Daily   Citalopram Hydrobromide 40 MG TAKE 1 TABLET EVERY DAY  Patient taking differently:  Take 40 mg by mouth daily.   Epoetin Alfa-epbx 10000 UNIT/ML Subcutaneous, Once a month at dialysis   Ferrous Sulfate 325 mg Oral Daily with breakfast   Furosemide 20 mg Oral Daily   Gentamicin Sulfate 0.1 % 1 application Topical Every other day   Losartan Potassium 50 mg Oral Daily   Nicotine 14 mg Transdermal Daily   Ondansetron 8 mg Oral Every 8 hours PRN   predniSONE 10 mg Oral Daily with breakfast   Ruxolitinib Phosphate 15 mg Oral Daily, Take AFTER dialysis.   traMADol HCl 50 mg Oral Every 12 hours PRN   Varenicline Tartrate - Not started per patient report    Head CT CLINICAL DATA:  Seizure  EXAM: CT HEAD WITHOUT CONTRAST  TECHNIQUE: Contiguous axial images were obtained from the base of the skull through the vertex without intravenous contrast.  COMPARISON:  12/21/2009  FINDINGS: Brain: Small focal areas of encephalomalacia have developed within the high right frontal lobe and right parietal lobe in keeping with remote cortical infarcts, new since prior examination. No evidence of acute intracranial hemorrhage or infarct. No abnormal mass effect or midline shift. No abnormal intra or extra-axial mass  lesion or fluid collection. Ventricular size is normal. Cerebellum is unremarkable.  Vascular: Moderate atherosclerotic calcification within the carotid siphons. No asymmetric hyperdense vasculature at the skull base.  Skull: Intact  Sinuses/Orbits: The orbits are unremarkable. The visualized paranasal sinuses are clear.  Other: Mastoid air cells and middle ear cavities are clear.  IMPRESSION: No acute intracranial hemorrhage or infarct. Small remote high right frontal and parietal cortical  infarcts are new since remote prior examination.

## 2020-11-20 NOTE — Progress Notes (Signed)
eeg done °

## 2020-11-20 NOTE — Progress Notes (Signed)
[facility]  ROUNDING NOTE   Subjective:  Patient well-known to [place] as we follow him for outpatient peritoneal dialysis. Patient underwent left reverse total shoulder arthroplasty yesterday. Immediately postoperatively the patient was doing well. However he developed muscle twitching in his left paraspinal muscles postoperatively. He was also witnessed as having generalized twitching and was felt to have a seizure episode. Subsequently moved over to the critical care unit. Patient received general anesthesia along with interscalene nerve block.   Objective:  Vital signs in last 24 hours:  Temp:  [97 F (36.1 C)-98.2 F (36.8 C)] 98 F (36.7 C) (03/01 0500) Pulse Rate:  [56-122] 57 (03/01 0800) Resp:  [9-28] 28 (03/01 0800) BP: (86-178)/(46-92) 152/82 (03/01 0800) SpO2:  [90 %-99 %] 96 % (03/01 0800) FiO2 (%):  [28 %] 28 % (02/28 1302)  Weight change:  Filed Weights   11/19/20 0614  Weight: 73.9 kg    Intake/Output: I/O last 3 completed shifts: In: 850 [I.V.:550; IV Piggyback:300] Out: 130 [Drains:80; Blood:50]   Intake/Output this shift:  No intake/output data recorded.  Physical Exam: General:  No acute distress  Head:  Normocephalic, atraumatic. Moist oral mucosal membranes  Eyes:  Anicteric  Neck:  Supple  Lungs:   Clear to auscultation, normal effort  Heart:  S1S2 no rubs  Abdomen:   Soft, nontender, bowel sounds present  Extremities:  No peripheral edema.  Neurologic:  Awake, alert, following commands, muscle twitching on the left side of his neck  Skin:  No lesions  Access:  PD catheter in place    Basic Metabolic Panel: Recent Labs  Lab 11/15/20 0820 11/19/20 0626 11/20/20 0423  NA 135 137 134*  K 5.2* 4.6 5.2*  CL 103 105 103  CO2 22  --  18*  GLUCOSE 82 100* 125*  BUN 65* 71* 78*  CREATININE 6.53* 7.30* 7.95*  CALCIUM 8.1*  --  7.6*  MG  --   --  2.0    Liver Function Tests: Recent Labs  Lab 11/20/20 0423  AST 13*  ALT 8   ALKPHOS 45  BILITOT 0.6  PROT 5.6*  ALBUMIN 2.9*   No results for input(s): LIPASE, AMYLASE in the last 168 hours. No results for input(s): AMMONIA in the last 168 hours.  CBC: Recent Labs  Lab 11/15/20 0820 11/19/20 0626 11/20/20 0423  WBC 7.4  --  11.4*  HGB 9.8* 9.5* 8.6*  HCT 29.3* 28.0* 25.6*  MCV 89.9  --  89.5  PLT 140*  --  121*    Cardiac Enzymes: No results for input(s): CKTOTAL, CKMB, CKMBINDEX, TROPONINI in the last 168 hours.  BNP: Invalid input(s): POCBNP  CBG: Recent Labs  Lab 11/20/20 0303 11/20/20 0321  GLUCAP 121* 126*    Microbiology: Results for orders placed or performed during the hospital encounter of 11/15/20  Surgical pcr screen     Status: Abnormal   Collection Time: 11/15/20  8:20 AM   Specimen: Nasal Mucosa; Nasal Swab  Result Value Ref Range Status   MRSA, PCR NEGATIVE NEGATIVE Final   Staphylococcus aureus POSITIVE (A) NEGATIVE Final    Comment: RESULT CALLED TO, READ BACK BY AND VERIFIED WITH: REED RENO RN AT 1452 ON 11/15/20 SNG (NOTE) The Xpert SA Assay (FDA approved for NASAL specimens in patients 35 years of age and older), is one component of a comprehensive surveillance program. It is not intended to diagnose infection nor to guide or monitor treatment. Performed at [facility], [address] [place], [place] [address]   SARS CORONAVIRUS 2 (TAT 6-24 HRS) Nasopharyngeal Nasopharyngeal Swab     Status: None   Collection Time: 11/15/20  9:25 AM   Specimen: Nasopharyngeal Swab  Result Value Ref Range Status   SARS Coronavirus 2 NEGATIVE NEGATIVE Final    Comment: (NOTE) SARS-CoV-2 target nucleic acids are NOT DETECTED.  The SARS-CoV-2 RNA is generally detectable in upper and lower respiratory specimens during the acute phase of infection. Negative results do not preclude SARS-CoV-2 infection, do not rule out co-infections with other pathogens, and should not be used as the sole basis for treatment or other  patient management decisions. Negative results must be combined with clinical observations, patient history, and epidemiological information. The expected result is Negative.  Fact Sheet for Patients: SugarRoll.be  Fact Sheet for Healthcare Providers: https://www.woods-mathews.com/  This test is not yet approved or cleared by the [place] FDA and  has been authorized for detection and/or diagnosis of SARS-CoV-2 by FDA under an Emergency Use Authorization (EUA). This EUA will remain  in effect (meaning this test can be used) for the duration of the COVID-19 declaration under Se ction 564(b)(1) of the Act, 21 U.S.C. section 360bbb-3(b)(1), unless the authorization is terminated or revoked sooner.  Performed at [facility], [place] [address] [place], [place] [address]     Coagulation Studies: No results for input(s): LABPROT, INR in the last 72 hours.  Urinalysis: No results for input(s): COLORURINE, LABSPEC, PHURINE, GLUCOSEU, HGBUR, BILIRUBINUR, KETONESUR, PROTEINUR, UROBILINOGEN, NITRITE, LEUKOCYTESUR in the last 72 hours.  Invalid input(s): APPERANCEUR    Imaging: CT HEAD WO CONTRAST  Result Date: 11/20/2020 CLINICAL DATA:  Seizure EXAM: CT HEAD WITHOUT CONTRAST TECHNIQUE: Contiguous axial images were obtained from the base of the skull through the vertex without intravenous contrast. COMPARISON:  12/21/2009 FINDINGS: Brain: Small focal areas of encephalomalacia have developed within the high right frontal lobe and right parietal lobe in keeping with remote cortical infarcts, new since prior examination. No evidence of acute intracranial hemorrhage or infarct. No abnormal mass effect or midline shift. No abnormal intra or extra-axial mass lesion or fluid collection. Ventricular size is normal. Cerebellum is unremarkable. Vascular: Moderate atherosclerotic calcification within the carotid siphons. No asymmetric hyperdense  vasculature at the skull base. Skull: Intact Sinuses/Orbits: The orbits are unremarkable. The visualized paranasal sinuses are clear. Other: Mastoid air cells and middle ear cavities are clear. IMPRESSION: No acute intracranial hemorrhage or infarct. 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 Electronically Signed   By: Fidela Salisbury MD   On: 11/20/2020 04:46   DG Shoulder Left Port  Result Date: 11/20/2020 CLINICAL DATA:  LEFT shoulder replacement post seizure EXAM: LEFT SHOULDER COMPARISON:  Single portable AP view at 0742 hours compared to 11/19/2020 FINDINGS: Components of a reverse LEFT shoulder prosthesis are identified. Small humeral fracture fragment is identified adjacent to the humeral component of the prosthesis, not definitely seen on prior exam though this could potentially have been obscured by the prosthesis. No evidence of dislocation on single AP view. Hardware appears intact. Interval removal of surgical drain. IMPRESSION: No gross evidence of dislocation on single AP view. Small humeral fracture fragment is seen medially adjacent to the humeral component, not definitely seen on the prior study of 11/19/2020. These results will be called to the ordering clinician or representative by the Radiologist Assistant, and communication documented in the PACS or [employer]. Electronically Signed   By:  Lavonia Dana M.D.   On: 11/20/2020 08:55   DG Shoulder Left Port  Result Date: 11/19/2020 CLINICAL DATA:  Shoulder replacement. EXAM: LEFT SHOULDER COMPARISON:  CT left shoulder 10/24/2020. FINDINGS: Single view of the left shoulder shows the patient to be status post reverse shoulder replacement. No evidence for immediate hardware complication on the single image. IMPRESSION: Status post left shoulder replacement without complicating features. Electronically Signed   By: Misty Stanley M.D.   On: 11/19/2020 12:37   [place] OR NERVE  BLOCK-IMAGE ONLY [facility])  Result Date: 11/19/2020 There is no interpretation for this exam.  This order is for images obtained during a surgical procedure.  Please See "Surgeries" Tab for more information regarding the procedure.     Medications:   . sodium chloride 50 mL/hr at 11/19/20 0728  . sodium chloride Stopped (11/19/20 2100)   . acetaminophen  1,000 mg Oral Q8H  . amLODipine  10 mg Oral Daily  . carvedilol  18.75 mg Oral BID WC  . Chlorhexidine Gluconate Cloth  6 each Topical Daily  . cholecalciferol  1,000 Units Oral Daily  . citalopram  40 mg Oral Daily  . docusate sodium  100 mg Oral BID  . ferrous sulfate  325 mg Oral Q breakfast  . furosemide  20 mg Oral Daily  . LORazepam      . losartan  50 mg Oral Daily  . neomycin-polymyxin B  20 mL Irrigation Once  . nicotine  14 mg Transdermal Daily  . predniSONE  10 mg Oral Q breakfast  . sodium zirconium cyclosilicate  5 g Oral Once   acetaminophen, alum & mag hydroxide-simeth, bisacodyl, diphenhydrAMINE, hydrALAZINE, HYDROmorphone (DILAUDID) injection, LORazepam, menthol-cetylpyridinium **OR** phenol, methocarbamol, ondansetron (ZOFRAN) IV, oxyCODONE, oxyCODONE, senna-docusate  Assessment/ Plan:  65 y.o. male the past medical history of ESRD on PD, myelodysplastic syndrome, hypertension, anemia of chronic kidney disease, secondary hyperparathyroidism who underwent left reverse total shoulder arthroplasty and developed muscle twitching and possible seizure-like activity postoperatively.   1.  ESRD on PD.  Given the fact that he is having muscle twitching we will proceed with peritoneal dialysis this AM.  Anesthetic agents could potentially be responsible for the muscle twitching.  Hopefully peritoneal dialysis will help to clear some of the anesthetic agents used.  2.  Anemia of chronic kidney disease.  Hemoglobin down to 8.6.  He receives Epogen as an outpatient.  3.  Secondary hyperparathyroidism.  We will continue to  monitor bone mineral metabolism parameters over the course of hospitalization.  4.  Hyperkalemia.  Serum potassium 5.2.  Being administered Lokelma 5 g p.o. x1.  5.  Muscle twitching/possible seizure activity.  Agree with neurology evaluation.  Did receive Ativan earlier.  Anesthetic agents could potentially causing this issue.  We will discuss further with Dr. Posey Pronto.  LOS: 1 [place] 3/1/20229:22 AM

## 2020-11-20 NOTE — Consult Note (Signed)
Medical Consultation   James Arnold  YFV:[id number]  DOB: 1955-11-11  DOA: 11/19/2020  PCP: Valerie Roys, DO   Outpatient Specialists:    Requesting physician: Dr. Posey Pronto of ortho  Reason for consultation: -seizure-like activity   History of Present Illness: James Arnold is an 65 y.o. male ESRD-peritoneal dialysis, HTN, HLD, COPD, gout, depression, thrombocytopenia, CAD with stents,dCHF, anemia, myelofibrosis, smoker, admitted for leftreverse total shoulder arthroplasty by ortho team. Pt is 1 day post surgery. We are asked to consult due to seizure-like activity.   Her his wife at bedside pt has had 5-6 episodes of seizure-like activity since last night, described as left foot shaking and twitching, then progresses to involve his entire left leg, with some twitching of his left fingers. Pt has no LOC or confusion. The episode self resolved in about 30- 40 seconds.  No facial droop or slurred speech. Patient denies chest pain, cough, shortness breath.  Patient has nausea and vomited once earlier, currently no nausea, vomiting, diarrhea or abdominal pain.  No symptoms of UTI. Pt denies any prior history of seizures. no significant EtOH intake, no prior head injury or strokes. Of note, pt is taking tramadol for pain and Chantix due to smoking.  Lab and image: WBC 11.4, negative Covid PCR 11/15/2020, potassium 5.2, bicarbonate 18, creatinine 7.95, BUN 78, temperature normal, blood pressure 86/47, 132/69, heart rate of 120, 91, RR 22, 18, oxygen saturation 90-99% on room air.  CT of the head is negative for acute intracranial abnormalities, but it showed remote cortical infarction.   Review of Systems:   General: no fevers, chills, no changes in body weight, no changes in appetite Skin: no rash HEENT: no blurry vision, hearing changes or sore throat Pulm: no dyspnea, coughing, wheezing CV: no chest pain, palpitations, shortness of breath Abd: no nausea/vomiting,  abdominal pain, diarrhea/constipation GU: no dysuria, hematuria, polyuria Ext: Has left shoulder pain  neuro: no weakness, numbness, or tingling.  Has a seizure-like activity.    Past Medical History: Past Medical History:  Diagnosis Date  . Anemia   . Benign hypertensive renal disease   . CAD (coronary artery disease)    a. 08/1996 s/p BMS to the mLAD ([facility]); b. 12/2017 MV: EF 46%, fixed apical defect w/ significant GI uptake artifact. No ischemia-> low risk.  . CKD (chronic kidney disease), stage V ([place])    on peritoneal dialysis  . Complication of anesthesia    please call by "James Arnold" when waking up!!  . COPD (chronic obstructive pulmonary disease) ([place])    centrilobular emphysema. pt is poorly controlled with his copd/smoking.  . Depression   . Diastolic dysfunction    a. 12/2017 Echo: EF 60-65%, no rwma, Gr1 DD, mild AI/MR. Nl RVSP.  [name] Kitchen Dyspnea    with exertion  . Fatigue   . FSGS (focal segmental glomerulosclerosis)   . Hyperlipidemia 10/2002  . Hypertension   . myelofibrosis    bone marrow failure  . 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 Myocardial infarction ([place]) 1997   stent x 1  . Smoker   . Thrombocytopenia ([place])     Past Surgical History: Past Surgical History:  Procedure Laterality Date  . ANGIOPLASTY    . BONE MARROW ASPIRATION  03/2009  . CARDIAC CATHETERIZATION  1997   stent x 1  . COLONOSCOPY WITH PROPOFOL N/A 08/09/2018   Procedure: COLONOSCOPY  WITH PROPOFOL;  Surgeon: [name], MD;  Location[facility] ENDOSCOPY;  Service: Gastroenterology;  Laterality: N/A;  . CORONARY STENT PLACEMENT  08/1996  . EXTERIORIZATION OF A CONTINUOUS AMBULATORY PERITONEAL DIALYSIS CATHETER    . EYE SURGERY Bilateral    cats removed  . REVERSE SHOULDER ARTHROPLASTY Left 11/19/2020   Procedure: Left reverse shoulder arthroplasty;  Surgeon: Leim Fabry, MD;  Location: [facility] ORS;  Service: Orthopedics;  Laterality: Left;     Allergies:  No Known  Allergies   Social History:  reports that he has been smoking cigarettes. He has a 35.25 pack-year smoking history. He has never used smokeless tobacco. He reports previous alcohol use. He reports that he does not use drugs.   Family History: Family History  Problem Relation Age of Onset  . Stroke Mother        Low BP stroke  . Depression Father   . Depression Sister        Breast  . Heart disease Other   . Breast cancer Other   . Lung cancer Other   . Ovarian cancer Other   . Stomach cancer Other     Physical Exam: Vitals:   11/20/20 1600 11/20/20 1700 11/20/20 1800 11/20/20 1900  BP: 129/70 (!) 105/92 122/66 138/65  Pulse: 77 88 75 73  Resp: 17 15 11 18   Temp:      TempSrc:      SpO2: 94% 94% 94% 95%  Weight:      Height:       General: Not in acute distress HEENT: PERRL, EOMI, no scleral icterus, No JVD or bruit Cardiac: S1/S2, RRR, No murmurs, gallops or rubs Pulm:  No rales, wheezing, rhonchi or rubs. Abd: Soft, nondistended, nontender, no rebound pain, no organomegaly, BS present Ext: No edema. 2+DP/PT pulse bilaterally Musculoskeletal: has tenderness over surgical site in left shoulder Skin: No rashes.  Neuro: Alert and oriented X3, cranial nerves II-XII grossly intact, moves all extremities normally Psych: Patient is not psychotic, no suicidal or hemocidal ideation.     Data reviewed:  I have personally reviewed following labs and imaging studies Labs:  CBC: Recent Labs  Lab 11/15/20 0820 11/19/20 0626 11/20/20 0423  WBC 7.4  --  11.4*  HGB 9.8* 9.5* 8.6*  HCT 29.3* 28.0* 25.6*  MCV 89.9  --  89.5  PLT 140*  --  121*    Basic Metabolic Panel: Recent Labs  Lab 11/15/20 0820 11/19/20 0626 11/20/20 0423  NA 135 137 134*  K 5.2* 4.6 5.2*  CL 103 105 103  CO2 22  --  18*  GLUCOSE 82 100* 125*  BUN 65* 71* 78*  CREATININE 6.53* 7.30* 7.95*  CALCIUM 8.1*  --  7.6*  MG  --   --  2.0   GFR Estimated Creatinine Clearance: 8.6 mL/min (A)  (by C-G formula based on SCr of 7.95 mg/dL (H)). Liver Function Tests: Recent Labs  Lab 11/20/20 0423  AST 13*  ALT 8  ALKPHOS 45  BILITOT 0.6  PROT 5.6*  ALBUMIN 2.9*   No results for input(s): LIPASE, AMYLASE in the last 168 hours. No results for input(s): AMMONIA in the last 168 hours. Coagulation profile Recent Labs  Lab 11/15/20 0820  INR 1.1    Cardiac Enzymes: No results for input(s): CKTOTAL, CKMB, CKMBINDEX, TROPONINI in the last 168 hours. BNP: Invalid input(s): POCBNP CBG: Recent Labs  Lab 11/20/20 0303 11/20/20 0321 11/20/20 0418  GLUCAP 121* 126* 124*   D-Dimer No  results for input(s): DDIMER in the last 72 hours. Hgb A1c No results for input(s): HGBA1C in the last 72 hours. Lipid Profile No results for input(s): CHOL, HDL, LDLCALC, TRIG, CHOLHDL, LDLDIRECT in the last 72 hours. Thyroid function studies No results for input(s): TSH, T4TOTAL, T3FREE, THYROIDAB in the last 72 hours.  Invalid input(s): FREET3 Anemia work up No results for input(s): VITAMINB12, FOLATE, FERRITIN, TIBC, IRON, RETICCTPCT in the last 72 hours. Urinalysis    Component Value Date/Time   COLORURINE YELLOW (A) 11/23/2017 1022   APPEARANCEUR Clear 07/10/2020 0908   LABSPEC 1.008 11/23/2017 1022   PHURINE 5.0 11/23/2017 1022   GLUCOSEU Negative 07/10/2020 0908   HGBUR NEGATIVE 11/23/2017 1022   BILIRUBINUR Negative 07/10/2020 0908   KETONESUR NEGATIVE 11/23/2017 1022   PROTEINUR 3+ (A) 07/10/2020 0908   PROTEINUR 100 (A) 11/23/2017 1022   NITRITE Negative 07/10/2020 0908   NITRITE NEGATIVE 11/23/2017 1022   LEUKOCYTESUR Negative 07/10/2020 0908     Microbiology Recent Results (from the past 240 hour(s))  Surgical pcr screen     Status: Abnormal   Collection Time: 11/15/20  8:20 AM   Specimen: Nasal Mucosa; Nasal Swab  Result Value Ref Range Status   MRSA, PCR NEGATIVE NEGATIVE Final   Staphylococcus aureus POSITIVE (A) NEGATIVE Final    Comment: RESULT CALLED TO,  READ BACK BY AND VERIFIED WITH: REED RENO RN AT 1452 ON 11/15/20 SNG (NOTE) The Xpert SA Assay (FDA approved for NASAL specimens in patients 40 years of age and older), is one component of a comprehensive surveillance program. It is not intended to diagnose infection nor to guide or monitor treatment. Performed at [facility], [place], [place] [address]   SARS CORONAVIRUS 2 (TAT 6-24 HRS) Nasopharyngeal Nasopharyngeal Swab     Status: None   Collection Time: 11/15/20  9:25 AM   Specimen: Nasopharyngeal Swab  Result Value Ref Range Status   SARS Coronavirus 2 NEGATIVE NEGATIVE Final    Comment: (NOTE) SARS-CoV-2 target nucleic acids are NOT DETECTED.  The SARS-CoV-2 RNA is generally detectable in upper and lower respiratory specimens during the acute phase of infection. Negative results do not preclude SARS-CoV-2 infection, do not rule out co-infections with other pathogens, and should not be used as the sole basis for treatment or other patient management decisions. Negative results must be combined with clinical observations, patient history, and epidemiological information. The expected result is Negative.  Fact Sheet for Patients: SugarRoll.be  Fact Sheet for Healthcare Providers: https://www.woods-mathews.com/  This test is not yet approved or cleared by the [place] FDA and  has been authorized for detection and/or diagnosis of SARS-CoV-2 by FDA under an Emergency Use Authorization (EUA). This EUA will remain  in effect (meaning this test can be used) for the duration of the COVID-19 declaration under Se ction 564(b)(1) of the Act, 21 U.S.C. section 360bbb-3(b)(1), unless the authorization is terminated or revoked sooner.  Performed at [facility], [place] [address] [place], [place] [address]        Inpatient Medications:   Scheduled Meds: . acetaminophen  1,000 mg Oral Q8H  .  amLODipine  10 mg Oral Daily  . carvedilol  18.75 mg Oral BID WC  . Chlorhexidine Gluconate Cloth  6 each Topical Daily  . cholecalciferol  1,000 Units Oral Daily  . citalopram  40 mg Oral Daily  . docusate sodium  100 mg Oral BID  . ferrous sulfate  325 mg Oral Q  breakfast  . furosemide  20 mg Oral Daily  . gentamicin cream  1 application Topical Daily  . levETIRAcetam  500 mg Oral Q24H  . losartan  50 mg Oral Daily  . neomycin-polymyxin B  20 mL Irrigation Once  . nicotine  14 mg Transdermal Daily  . predniSONE  10 mg Oral Q breakfast   Continuous Infusions: . sodium chloride 50 mL/hr at 11/19/20 0728  . sodium chloride Stopped (11/19/20 2100)  . dialysis solution 1.5% low-MG/low-CA       Radiological Exams on Admission: CT HEAD WO CONTRAST  Result Date: 11/20/2020 CLINICAL DATA:  Seizure EXAM: CT HEAD WITHOUT CONTRAST TECHNIQUE: Contiguous axial images were obtained from the base of the skull through the vertex without intravenous contrast. COMPARISON:  12/21/2009 FINDINGS: Brain: Small focal areas of encephalomalacia have developed within the high right frontal lobe and right parietal lobe in keeping with remote cortical infarcts, new since prior examination. No evidence of acute intracranial hemorrhage or infarct. No abnormal mass effect or midline shift. No abnormal intra or extra-axial mass lesion or fluid collection. Ventricular size is normal. Cerebellum is unremarkable. Vascular: Moderate atherosclerotic calcification within the carotid siphons. No asymmetric hyperdense vasculature at the skull base. Skull: Intact Sinuses/Orbits: The orbits are unremarkable. The visualized paranasal sinuses are clear. Other: Mastoid air cells and middle ear cavities are clear. IMPRESSION: No acute intracranial hemorrhage or infarct. 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 Electronically Signed   By: Fidela Salisbury MD   On: 11/20/2020  04:46   DG Shoulder Left Port  Result Date: 11/20/2020 CLINICAL DATA:  LEFT shoulder replacement post seizure EXAM: LEFT SHOULDER COMPARISON:  Single portable AP view at 0742 hours compared to 11/19/2020 FINDINGS: Components of a reverse LEFT shoulder prosthesis are identified. Small humeral fracture fragment is identified adjacent to the humeral component of the prosthesis, not definitely seen on prior exam though this could potentially have been obscured by the prosthesis. No evidence of dislocation on single AP view. Hardware appears intact. Interval removal of surgical drain. IMPRESSION: No gross evidence of dislocation on single AP view. Small humeral fracture fragment is seen medially adjacent to the humeral component, not definitely seen on the prior study of 11/19/2020. These results will be called to the ordering clinician or representative by the Radiologist Assistant, and communication documented in the PACS or [employer]. Electronically Signed   By: Lavonia Dana M.D.   On: 11/20/2020 08:55   DG Shoulder Left Port  Result Date: 11/19/2020 CLINICAL DATA:  Shoulder replacement. EXAM: LEFT SHOULDER COMPARISON:  CT left shoulder 10/24/2020. FINDINGS: Single view of the left shoulder shows the patient to be status post reverse shoulder replacement. No evidence for immediate hardware complication on the single image. IMPRESSION: Status post left shoulder replacement without complicating features. Electronically Signed   By: Misty Stanley M.D.   On: 11/19/2020 12:37   EEG adult  Result Date: 11/20/2020 Lora Havens, MD     11/20/2020  3:38 PM Patient Name: James Arnold MRN: [id number] Epilepsy Attending: Lora Havens Referring Physician/Provider: Dr Donnetta Simpers Date: 11/20/2020 Duration: 26.21 mins Patient history: 65 y.o. male with PMH significant for CAD, CKD stage 5 on peritoneal dialysis, COPD, HLD, HTN, myelofibrosis, hx of MI, smoker who is admitted s/p left reverse total shoulder  arthroplasty and had a few episodes concerning for partial seizures during which patient has Abrupt L foot rhythmic jerking > Entire left lower  ext jerking in a few secs > jerking of his left fingers. Self resolved in about 40 secs. EEG to evaluate for seizure Level of alertness: Awake AEDs during EEG study: LEV Technical aspects: This EEG study was done with scalp electrodes positioned according to the 10-20 International system of electrode placement. Electrical activity was acquired at a sampling rate of 500Hz  and reviewed with a high frequency filter of 70Hz  and a low frequency filter of 1Hz . EEG data were recorded continuously and digitally stored. Description: The posterior dominant rhythm consists of 7.5 Hz activity of moderate voltage (25-35 uV) seen predominantly in posterior head regions, symmetric and reactive to eye opening and eye closing. Hyperventilation and photic stimulation were not performed.   IMPRESSION: This study is within normal limits. No seizures or epileptiform discharges were seen throughout the recording. Priyanka O Yadav   [place] OR NERVE BLOCK-IMAGE ONLY [facility])  Result Date: 11/19/2020 There is no interpretation for this exam.  This order is for images obtained during a surgical procedure.  Please See "Surgeries" Tab for more information regarding the procedure.    Impression/Recommendations Principal Problem:   Rotator cuff arthropathy Active Problems:   CAD (coronary artery disease)   Primary myelofibrosis (HCC)   Tobacco abuse   Depression   Seizure-like activity (HCC)   Thrombocytopenia (HCC)   Anemia in ESRD (end-stage renal disease) (HCC)   Hyperkalemia   Leukocytosis   Status post shoulder replacement   ESRD on peritoneal dialysis ([place])    Rotator cuff arthropathy and status post shoulder replacement: 1 day post leftreverse total shoulder arthroplasty. -Pain management per primary Ortho team -SCD for DVT prophylaxis  Seizure-like activity: Dr.Khaliqdina  of neuro is consulted. Likely due to focal seizure per Dr. Lorrin Goodell. -seizure precaution -As needed Ativan for seizure -Hold off tramadol and Chantix -Highly appreciate neurologist recommendations, follow-up recommendations as follows:  - I ordered Keppra 20mg /Kg IV once  - I ordered Keppra 500mg  BID.  - I ordered rEEG  - Will follow up on MRI Brain without contrast  - Recommend alternative to Tramadol.  - Agree with peritoneal dialysis per Nephrology.   CAD (coronary artery disease): No chest pain -Blood pressure control -Patient is not taking aspirin or statin  ESRD on peritoneal dialysis: -Dr. Holley Raring of renal is consulted for dialysis  Primary myelofibrosis [facility]): Patient used to be on Jakafi, which was stopped due to side effects of weakness per his wife -Follow-up with Dr. Rogue Bussing  Tobacco abuse -Nicotine patch  Depression -Continue home medications  Thrombocytopenia ([place]): This is chronic issues.  Platelet of 121. -Follow-up with CBC  Anemia in ESRD (end-stage renal disease) ([place]): Hemoglobin 8.6 (9.5 before surgery) -Follow-up with CBC  Hyperkalemia: Potassium 5.2 -Dialysis per renal  Leukocytosis: Mild leukocytosis with WBC 11.4, no source of infection identified, likely reactive -Follow-up with CBC      Thank you for this consultation.  Our [facility] hospitalist team will follow the patient with you.   Time Spent:  30 min  Ivor Costa M.D. [facility] 11/20/2020, 7:13 PM

## 2020-11-20 NOTE — Significant Event (Signed)
Rapid Response Event Note   Reason for Call :  Seziure  Initial Focused Assessment:  Patient Alert and oriented on arrival Patient Vital's: BP 161/87 HR 106 O2 98 on 2L Temp 97.4 Patient rates a pain in his left ar of 4 /10 Patient described muscles twitching and vomiting. Staff says they observed the twitching and that his eyes rolled back  Patient had possible seizure activity during rapid  Patient back to normal baseline neurologically post seziure  Interventions:  Zofran given  2mg  of Ativan given CT of Head order  BMP ordered Plan of Care:  Monitor for seizure activity and transfer to stepdown Event Summary:  RRtT arived at bedside to find patient  WNL after Seziure Patient had another [place] Patient Transferred to CT with no incidents Patient Transferred From CT to ICU/SD with no incidents  MD Notified:0301  Call [place] WUXL:[id number]  Gonzella Lex, RN

## 2020-11-21 ENCOUNTER — Inpatient Hospital Stay: Payer: Medicare HMO

## 2020-11-21 DIAGNOSIS — Z992 Dependence on renal dialysis: Secondary | ICD-10-CM | POA: Diagnosis not present

## 2020-11-21 DIAGNOSIS — E875 Hyperkalemia: Secondary | ICD-10-CM

## 2020-11-21 DIAGNOSIS — R338 Other retention of urine: Secondary | ICD-10-CM

## 2020-11-21 DIAGNOSIS — I63511 Cerebral infarction due to unspecified occlusion or stenosis of right middle cerebral artery: Secondary | ICD-10-CM | POA: Diagnosis not present

## 2020-11-21 DIAGNOSIS — R339 Retention of urine, unspecified: Secondary | ICD-10-CM | POA: Diagnosis not present

## 2020-11-21 DIAGNOSIS — M12812 Other specific arthropathies, not elsewhere classified, left shoulder: Secondary | ICD-10-CM | POA: Diagnosis not present

## 2020-11-21 DIAGNOSIS — R569 Unspecified convulsions: Secondary | ICD-10-CM | POA: Diagnosis not present

## 2020-11-21 DIAGNOSIS — I2510 Atherosclerotic heart disease of native coronary artery without angina pectoris: Secondary | ICD-10-CM

## 2020-11-21 DIAGNOSIS — N186 End stage renal disease: Secondary | ICD-10-CM | POA: Diagnosis not present

## 2020-11-21 LAB — HEMOGLOBIN A1C
Hgb A1c MFr Bld: 4.9 % (ref 4.8–5.6)
Mean Plasma Glucose: 93.93 mg/dL

## 2020-11-21 LAB — LIPID PANEL
Cholesterol: 142 mg/dL (ref 0–200)
HDL: 25 mg/dL — ABNORMAL LOW (ref >40)
LDL Cholesterol: 91 mg/dL (ref 0–99)
Total CHOL/HDL Ratio: 5.7 RATIO
Triglycerides: 131 mg/dL (ref <150)
VLDL: 26 mg/dL (ref 0–40)

## 2020-11-21 LAB — SURGICAL PATHOLOGY

## 2020-11-21 MED ORDER — MELATONIN 5 MG PO TABS: 5.0000 mg | ORAL_TABLET | Freq: Every evening | ORAL | Status: DC | PRN

## 2020-11-21 MED ORDER — SENNOSIDES-DOCUSATE SODIUM 8.6-50 MG PO TABS
1.0000 | ORAL_TABLET | Freq: Two times a day (BID) | ORAL | Status: DC
  Filled 2020-11-21 (×3): qty 1

## 2020-11-21 MED ORDER — TAMSULOSIN HCL 0.4 MG PO CAPS
0.4000 mg | ORAL_CAPSULE | Freq: Every day | ORAL | Status: DC
  Administered 2020-11-21 – 2020-11-24 (×4): 0.4 mg via ORAL
  Filled 2020-11-21 (×4): qty 1

## 2020-11-21 MED ORDER — CALCIUM CARBONATE ANTACID 500 MG PO CHEW
1.0000 | CHEWABLE_TABLET | Freq: Two times a day (BID) | ORAL | Status: DC | PRN
  Administered 2020-11-21: 200 mg via ORAL
  Filled 2020-11-21: qty 1

## 2020-11-21 MED ORDER — POLYETHYLENE GLYCOL 3350 17 G PO PACK
17.0000 g | PACK | Freq: Two times a day (BID) | ORAL | Status: DC
  Filled 2020-11-21 (×3): qty 1

## 2020-11-21 MED ORDER — ASPIRIN 81 MG PO CHEW
81.0000 mg | CHEWABLE_TABLET | Freq: Every day | ORAL | Status: DC
  Administered 2020-11-21 – 2020-11-24 (×4): 81 mg via ORAL
  Filled 2020-11-21 (×4): qty 1

## 2020-11-21 NOTE — Progress Notes (Signed)
Paged Dr. Kurtis Bushman to make her aware that bladder scan resulted   > 900 and that urology has not came to see patient yet.  Patient denies discomfort.

## 2020-11-21 NOTE — Progress Notes (Addendum)
Paged Dr. Kurtis Bushman via Shea Evans and asked if coreg should be held for BP 109/62. Waiting on call back. Also sent Dr. Lorrin Goodell a secure chart and asked if he saw MRI results.

## 2020-11-21 NOTE — Progress Notes (Signed)
Secure chatted Dr. Kurtis Bushman and asked about giving18.75 mg coreg due with BP 109/62. it was held yesterday for a similar BP. Waiting for response.

## 2020-11-21 NOTE — Progress Notes (Signed)
NEUROLOGY CONSULTATION PROGRESS NOTE   Date of service: November 21, 2020 Patient Name: James Arnold MRN:  [id number] DOB:  May 18, 1956  Brief HPI   James Arnold is a 65 y.o. 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 I witnessed one of the events and noted following. Abrupt L foot rhythmic jerking > Entire left lower ext jerking in a few secs > jerking of his left fingers. Self resolved in about 40 secs. No loss of awareness during the event and engages in a conversation during the event. Neuro exam immediately afterwards, demonstrated mild LLE weakness that improved. The event was clinically concerning for a focal seizure with retained awareness.  Interval Hx   No further episodes after he was started on Keppra 500mg  BID. MRI completed in evening today demonstrates Patchy acute/early subacute cortical infarcts within the right frontal, parietal and occipital lobes (MCA vascular territory, as well as MCA/PCA watershed territory and right PCA territory). Scattered superimposed microhemorrhages these sites.  Vitals   Vitals:   11/21/20 1532 11/21/20 1535 11/21/20 1600 11/21/20 1700  BP: 121/70  120/61   Pulse: 87  82 83  Resp: 13  12 14   Temp:  (!) 97.4 F (36.3 C)    TempSrc:  Oral    SpO2: 92%  92% 95%  Weight:      Height:         Body mass index is 26.04 kg/m.  Physical Exam   Neurologic Examination  Mental status/Cognition: Alert, oriented to self, place, month and year, good attention.  Speech/language: Fluent, comprehension intact, object naming intact, repetition intact.  Cranial nerves:   CN II Pupils equal and reactive to light, no VF deficits    CN III,IV,VI EOM intact, no gaze preference or deviation, no nystagmus    CN V normal sensation in V1, V2, and V3 segments bilaterally     CN VII no asymmetry, no nasolabial fold flattening    CN VIII normal hearing to speech    CN IX & X normal palatal elevation, no uvular deviation    CN XI 5/5 head turn and 5/5 shoulder shrug bilaterally    CN XII midline tongue protrusion    Motor:  Muscle bulk: poor, tone normal Mvmt Root Nerve  Muscle Right Left Comments  SA C5/6 Ax Deltoid     EF C5/6 Mc Biceps 5    EE C6/7/8 Rad Triceps 5    WF C6/7 Med FCR     WE C7/8 PIN ECU     F Ab C8/T1 U ADM/FDI 5 1   HF L1/2/3 Fem Illopsoas 5 5   KE L2/3/4 Fem Quad     DF L4/5 D Peron Tib Ant     PF S1/2 Tibial Grc/Sol      Sensation:  Light touch Intact throughout   Pin prick    Temperature    Vibration   Proprioception    Coordination/Complex Motor:  - Finger to Nose intact in RUE  Labs   Basic Metabolic Panel:  Lab Results  Component Value Date   NA 134 (L) 11/20/2020   K 5.2 (H) 11/20/2020   CO2 18 (L) 11/20/2020   GLUCOSE 125 (H) 11/20/2020   BUN 78 (H) 11/20/2020   CREATININE 7.95 (H) 11/20/2020   CALCIUM 7.6 (L) 11/20/2020  GFRNONAA 7 (L) 11/20/2020   GFRAA 14 (L) 07/10/2020   HbA1c: No results found for: HGBA1C LDL:  Lab Results  Component Value Date   LDLCALC 140 (H) 07/10/2020   Urine Drug Screen: No results found for: LABOPIA, COCAINSCRNUR, LABBENZ, AMPHETMU, THCU, LABBARB  Alcohol Level No results found for: ETH No results found for: PHENYTOIN, ZONISAMIDE, LAMOTRIGINE, LEVETIRACETA No results found for: PHENYTOIN, PHENOBARB, VALPROATE, CBMZ  Imaging and Diagnostic studies  MR BRAIN WO CONTRAST  IMPRESSION: Significantly motion degraded examination, as described and limiting evaluation.  Patchy acute/early subacute cortical infarcts within the right frontal, parietal and occipital lobes (MCA vascular territory, as well as MCA/PCA watershed territory and right PCA territory). Scattered superimposed microhemorrhages these sites.   Impression   James Arnold is a 65 y.o. male CAD, CKD stage 5  on peritoneal dialysis, COPD, HLD, HTN, myelofibrosis, hx of MI, smoker admitted s/p left reverse total shoulder arthroplasty and had a few episodes concerning for partial seizures afterwards.  Workup with rEEG unremarkable. MRI Brain with R MCA and MCA/PCA watershed territory infarcts with some scattered superimposed microhemorrhages.  Recommendations  Plan:  - Frequent Neuro checks per stroke unit protocol - I ordered Vascular imaging with MRA Angio Head without contrast and US Carotid doppler - I ordered TTE - I ordered Lipid panel with LDL - Please start statin if LDL > 70 - I ordered HbA1c - Antithrombotic - Aspirin 81mg  daily - DVT ppx - SBP goal - gradual normotension. - Continue telemetry monitoring. - Recommend bedside swallow screen prior to PO intake. - Stroke education booklet - Recommend PT/OT/SLP consult - We will continue to follow.   I spoke to his wife over the phone and gave her an update. ____________________________________________________________________   Thank you for the opportunity to take part in the care of this patient. If you have any further questions, please contact the neurology consultation attending.  Signed,  [place] Pager Number [phone]

## 2020-11-21 NOTE — Progress Notes (Signed)
11/20/20@2330 -Patient on peritoneal dialysis, HD machine is saying [place] is finished, call to [facility] to have him page HD RN to remove patient from treatment. Dr.[name] said ok for patient to remain connected to HD machine till AM when HD RN comes in.

## 2020-11-21 NOTE — Progress Notes (Signed)
Have not gotten a return page from Dr. Kurtis Bushman.  Paged Dr. Erlene Quan. Waiting for call back.

## 2020-11-21 NOTE — Progress Notes (Addendum)
Subjective: 2 Days Post-Op Procedure(s) (LRB): Left reverse shoulder arthroplasty (Left) Patient reports pain as moderate.   Patient is having problems with seizure and tremor activity, but improving.  Feeling much better this AM.   Plan is to go Home after hospital stay. Negative for chest pain and shortness of breath Fever: no Gastrointestinal:negative for nausea and vomiting  Objective: Vital signs in last 24 hours: Temp:  [97.4 F (36.3 C)-98.3 F (36.8 C)] 97.5 F (36.4 C) (03/02 0300) Pulse Rate:  [57-99] 98 (03/02 0600) Resp:  [11-33] 19 (03/02 0600) BP: (105-159)/(64-92) 142/71 (03/02 0600) SpO2:  [92 %-99 %] 94 % (03/02 0600)  Intake/Output from previous day:  Intake/Output Summary (Last 24 hours) at 11/21/2020 0656 Last data filed at 11/20/2020 2330 Gross per 24 hour  Intake 276.19 ml  Output 2300 ml  Net -2023.81 ml    Intake/Output this shift: Total I/O In: 0  Out: 900 [Urine:900]  Labs: Recent Labs    11/19/20 0626 11/20/20 0423  HGB 9.5* 8.6*   Recent Labs    11/19/20 0626 11/20/20 0423  WBC  --  11.4*  RBC  --  2.86*  HCT 28.0* 25.6*  PLT  --  121*   Recent Labs    11/19/20 0626 11/20/20 0423  NA 137 134*  K 4.6 5.2*  CL 105 103  CO2  --  18*  BUN 71* 78*  CREATININE 7.30* 7.95*  GLUCOSE 100* 125*  CALCIUM  --  7.6*   No results for input(s): LABPT, INR in the last 72 hours.   EXAM General - Patient is Alert and Oriented Extremity - Neurovascular intact Sensation intact distally Dorsiflexion/Plantar flexion intact Dressing/Incision - clean, dry, intact.  The shoulder immobilizer is in place.  Polar care is in place.   Motor Function - intact, moving fingers well on exam. Grip intact.  Able to give elbow posterior pressure.    Past Medical History:  Diagnosis Date  . Anemia   . Benign hypertensive renal disease   . CAD (coronary artery disease)    a. 08/1996 s/p BMS to the mLAD ([facility]); b. 12/2017 MV: EF 46%, fixed apical  defect w/ significant GI uptake artifact. No ischemia-> low risk.  . CKD (chronic kidney disease), stage V ([place])    on peritoneal dialysis  . Complication of anesthesia    please call by "[name]" when waking up!!  . COPD (chronic obstructive pulmonary disease) ([place])    centrilobular emphysema. pt is poorly controlled with his copd/smoking.  . Depression   . Diastolic dysfunction    a. 12/2017 Echo: EF 60-65%, no rwma, Gr1 DD, mild AI/MR. Nl RVSP.  [name] Kitchen Dyspnea    with exertion  . Fatigue   . FSGS (focal segmental glomerulosclerosis)   . Hyperlipidemia 10/2002  . Hypertension   . myelofibrosis    bone marrow failure  . 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 Myocardial infarction ([place]) 1997   stent x 1  . Smoker   . Thrombocytopenia (H[facility])     Assessment/Plan: 2 Days Post-Op Procedure(s) (LRB): Left reverse shoulder arthroplasty (Left) Principal Problem:   Rotator cuff arthropathy Active Problems:   CAD (coronary artery disease)   Primary myelofibrosis (HCC)   Tobacco abuse   Depression   Seizure-like activity (HCC)   Thrombocytopenia (HCC)   Anemia in ESRD (end-stage renal disease) (HCC)   Hyperkalemia   Leukocytosis   Status post shoulder replacement   ESRD on peritoneal dialysis ([place])  Estimated body mass index is 26.04 kg/m as calculated from the following:   Height as of this encounter: 5' 6.34" (1.685 m).   Weight as of this encounter: 73.9 kg. Advance diet Up with therapy  Appreciate Hospitalist assistance.  Appreciate Neurology assessment.  Awaiting MRI Brain today.    DVT Prophylaxis - Aspirin   Reche Dixon, PA-C Orthopaedic Surgery 11/21/2020, 6:56 AM     ADDENDUM: Patient reevaluated this a.m. Previous, more significant seizure-like activity has mostly resolved. The patient is not endorsing any further significant seizure-like activity at this time. He underwent peritoneal dialysis last night. He is scheduled to get brain MRI later this morning  as he was undergoing dialysis last night. Neurology recommendations pending.

## 2020-11-21 NOTE — Progress Notes (Signed)
Patient has not voided and bladder scan volume >589 ml.  Spoke with Dr. Posey Pronto and he gave order for in and out catheter.

## 2020-11-21 NOTE — Progress Notes (Signed)
PROGRESS NOTE    James Arnold  CWC:[id number] DOB: Mar 28, 1956 DOA: 11/19/2020 PCP: Valerie Roys, DO    Brief Narrative:  James Arnold is an 65 y.o. male ESRD-peritoneal dialysis, HTN, HLD, COPD, gout, depression, thrombocytopenia, CAD with stents,dCHF, anemia, myelofibrosis, smoker, admitted for leftreverse total shoulder arthroplasty by ortho team. Pt is 1 day post surgery. We are asked to consult due to seizure-like activity.  TRH consulted for seizure-like activity     Subjective: Wife complaining of urinary retention. nsg had difficulty doing in and out cath today.  They tried to place Foley it did not work.  I have consulted Dr. Erlene Quan who wants to get a pelvic ultrasound first before seeing the patient  Objective: Vitals:   11/21/20 1300 11/21/20 1500 11/21/20 1532 11/21/20 1535  BP: 124/72  121/70   Pulse: 85 86 87   Resp: 16 12 13    Temp:    (!) 97.4 F (36.3 C)  TempSrc:    Oral  SpO2: 91% 92% 92%   Weight:      Height:        Intake/Output Summary (Last 24 hours) at 11/21/2020 1720 Last data filed at 11/20/2020 2330 Gross per 24 hour  Intake 276.19 ml  Output 900 ml  Net -623.81 ml   Filed Weights   11/19/20 0614  Weight: 73.9 kg    Examination:  General exam: Appears calm and comfortable  Respiratory system: Clear to auscultation. Respiratory effort normal. Cardiovascular system: S1 & S2 heard, RRR. No JVD, murmurs, rubs, gallops or clicks.  Gastrointestinal system: Abdomen is nondistended, soft and nontender. Normal bowel sounds heard. Central nervous system: Alert and oriented.  Extremities: Left upper extremity in sling.  No edema of lower extremity Skin: Warm dry Psychiatry:. Mood & affect appropriate in current setting.     Data Reviewed: I have personally reviewed following labs and imaging studies  CBC: Recent Labs  Lab 11/15/20 0820 11/19/20 0626 11/20/20 0423  WBC 7.4  --  11.4*  HGB 9.8* 9.5* 8.6*  HCT 29.3* 28.0* 25.6*  MCV  89.9  --  89.5  PLT 140*  --  761*   Basic Metabolic Panel: Recent Labs  Lab 11/15/20 0820 11/19/20 0626 11/20/20 0423  NA 135 137 134*  K 5.2* 4.6 5.2*  CL 103 105 103  CO2 22  --  18*  GLUCOSE 82 100* 125*  BUN 65* 71* 78*  CREATININE 6.53* 7.30* 7.95*  CALCIUM 8.1*  --  7.6*  MG  --   --  2.0   GFR: Estimated Creatinine Clearance: 8.6 mL/min (A) (by C-G formula based on SCr of 7.95 mg/dL (H)). Liver Function Tests: Recent Labs  Lab 11/20/20 0423  AST 13*  ALT 8  ALKPHOS 45  BILITOT 0.6  PROT 5.6*  ALBUMIN 2.9*   No results for input(s): LIPASE, AMYLASE in the last 168 hours. No results for input(s): AMMONIA in the last 168 hours. Coagulation Profile: Recent Labs  Lab 11/15/20 0820  INR 1.1   Cardiac Enzymes: No results for input(s): CKTOTAL, CKMB, CKMBINDEX, TROPONINI in the last 168 hours. BNP (last 3 results) No results for input(s): PROBNP in the last 8760 hours. HbA1C: No results for input(s): HGBA1C in the last 72 hours. CBG: Recent Labs  Lab 11/20/20 0303 11/20/20 0321 11/20/20 0418  GLUCAP 121* 126* 124*   Lipid Profile: No results for input(s): CHOL, HDL, LDLCALC, TRIG, CHOLHDL, LDLDIRECT in the last 72 hours. Thyroid Function Tests: No results  for input(s): TSH, T4TOTAL, FREET4, T3FREE, THYROIDAB in the last 72 hours. Anemia Panel: No results for input(s): VITAMINB12, FOLATE, FERRITIN, TIBC, IRON, RETICCTPCT in the last 72 hours. Sepsis Labs: No results for input(s): PROCALCITON, LATICACIDVEN in the last 168 hours.  Recent Results (from the past 240 hour(s))  Surgical pcr screen     Status: Abnormal   Collection Time: 11/15/20  8:20 AM   Specimen: Nasal Mucosa; Nasal Swab  Result Value Ref Range Status   MRSA, PCR NEGATIVE NEGATIVE Final   Staphylococcus aureus POSITIVE (A) NEGATIVE Final    Comment: RESULT CALLED TO, READ BACK BY AND VERIFIED WITH: REED RENO RN AT 1452 ON 11/15/20 SNG (NOTE) The Xpert SA Assay (FDA approved for  NASAL specimens in patients 28 years of age and older), is one component of a comprehensive surveillance program. It is not intended to diagnose infection nor to guide or monitor treatment. Performed at [facility], [place], [place] [address]   SARS CORONAVIRUS 2 (TAT 6-24 HRS) Nasopharyngeal Nasopharyngeal Swab     Status: None   Collection Time: 11/15/20  9:25 AM   Specimen: Nasopharyngeal Swab  Result Value Ref Range Status   SARS Coronavirus 2 NEGATIVE NEGATIVE Final    Comment: (NOTE) SARS-CoV-2 target nucleic acids are NOT DETECTED.  The SARS-CoV-2 RNA is generally detectable in upper and lower respiratory specimens during the acute phase of infection. Negative results do not preclude SARS-CoV-2 infection, do not rule out co-infections with other pathogens, and should not be used as the sole basis for treatment or other patient management decisions. Negative results must be combined with clinical observations, patient history, and epidemiological information. The expected result is Negative.  Fact Sheet for Patients: SugarRoll.be  Fact Sheet for Healthcare Providers: https://www.woods-mathews.com/  This test is not yet approved or cleared by the [place] FDA and  has been authorized for detection and/or diagnosis of SARS-CoV-2 by FDA under an Emergency Use Authorization (EUA). This EUA will remain  in effect (meaning this test can be used) for the duration of the COVID-19 declaration under Se ction 564(b)(1) of the Act, 21 U.S.C. section 360bbb-3(b)(1), unless the authorization is terminated or revoked sooner.  Performed at [facility], [place] [address], [place], [place] [address]          Radiology Studies: CT HEAD WO CONTRAST  Result Date: 11/20/2020 CLINICAL DATA:  Seizure EXAM: CT HEAD WITHOUT CONTRAST TECHNIQUE: Contiguous axial images were obtained from the base of the skull  through the vertex without intravenous contrast. COMPARISON:  12/21/2009 FINDINGS: Brain: Small focal areas of encephalomalacia have developed within the high right frontal lobe and right parietal lobe in keeping with remote cortical infarcts, new since prior examination. No evidence of acute intracranial hemorrhage or infarct. No abnormal mass effect or midline shift. No abnormal intra or extra-axial mass lesion or fluid collection. Ventricular size is normal. Cerebellum is unremarkable. Vascular: Moderate atherosclerotic calcification within the carotid siphons. No asymmetric hyperdense vasculature at the skull base. Skull: Intact Sinuses/Orbits: The orbits are unremarkable. The visualized paranasal sinuses are clear. Other: Mastoid air cells and middle ear cavities are clear. IMPRESSION: No acute intracranial hemorrhage or infarct. 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 Electronically Signed   By: Fidela Salisbury MD   On: 11/20/2020 04:46   US PELVIS LIMITED (TRANSABDOMINAL ONLY)  Result Date: 11/21/2020 CLINICAL DATA:  Urinary retention.  Unable to place Foley catheter.  EXAM: LIMITED ULTRASOUND OF PELVIS TECHNIQUE: Limited transabdominal ultrasound examination of the pelvis was performed. COMPARISON:  CT 12/15/2018 FINDINGS: Urinary bladder is moderately distended. No bladder wall thickening. Bladder volume 808 mL. Patient unable to void. Prostate measures 5.9 x 5.3 x 4.8 cm.  Prostate volume 80 mL. IMPRESSION: Moderately distended urinary bladder.  Patient unable to void. Mildly prominent prostate. Electronically Signed   By: Rolm Baptise M.D.   On: 11/21/2020 17:01   DG Shoulder Left Port  Result Date: 11/20/2020 CLINICAL DATA:  LEFT shoulder replacement post seizure EXAM: LEFT SHOULDER COMPARISON:  Single portable AP view at 0742 hours compared to 11/19/2020 FINDINGS: Components of a reverse LEFT shoulder prosthesis are identified. Small  humeral fracture fragment is identified adjacent to the humeral component of the prosthesis, not definitely seen on prior exam though this could potentially have been obscured by the prosthesis. No evidence of dislocation on single AP view. Hardware appears intact. Interval removal of surgical drain. IMPRESSION: No gross evidence of dislocation on single AP view. Small humeral fracture fragment is seen medially adjacent to the humeral component, not definitely seen on the prior study of 11/19/2020. These results will be called to the ordering clinician or representative by the Radiologist Assistant, and communication documented in the PACS or [employer]. Electronically Signed   By: Lavonia Dana M.D.   On: 11/20/2020 08:55   EEG adult  Result Date: 11/20/2020 Lora Havens, MD     11/20/2020  3:38 PM Patient Name: James Arnold MRN: [id number] Epilepsy Attending: Lora Havens Referring Physician/Provider: Dr Donnetta Simpers Date: 11/20/2020 Duration: 26.21 mins Patient history: 65 y.o. 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 Self resolved in about 40 secs. EEG to evaluate for seizure Level of alertness: Awake AEDs during EEG study: LEV Technical aspects: This EEG study was done with scalp electrodes positioned according to the 10-20 International system of electrode placement. Electrical activity was acquired at a sampling rate of 500Hz  and reviewed with a high frequency filter of 70Hz  and a low frequency filter of 1Hz . EEG data were recorded continuously and digitally stored. Description: The posterior dominant rhythm consists of 7.5 Hz activity of moderate voltage (25-35 uV) seen  predominantly in posterior head regions, symmetric and reactive to eye opening and eye closing. Hyperventilation and photic stimulation were not performed.   IMPRESSION: This study is within normal limits. No seizures or epileptiform discharges were seen throughout the recording. Priyanka Barbra Sarks        Scheduled Meds: . amLODipine  10 mg Oral Daily  . carvedilol  18.75 mg Oral BID WC  . Chlorhexidine Gluconate Cloth  6 each Topical Daily  . cholecalciferol  1,000 Units Oral Daily  . citalopram  40 mg Oral Daily  . docusate sodium  100 mg Oral BID  . ferrous sulfate  325 mg Oral Q breakfast  . furosemide  20 mg Oral Daily  . gentamicin cream  1 application Topical Daily  . levETIRAcetam  500 mg Oral Q24H  . losartan  50 mg Oral Daily  . neomycin-polymyxin B  20 mL Irrigation Once  . nicotine  14 mg Transdermal Daily  .  predniSONE  10 mg Oral Q breakfast   Continuous Infusions: . sodium chloride 50 mL/hr at 11/19/20 0728  . sodium chloride Stopped (11/19/20 2100)  . dialysis solution 1.5% low-MG/low-CA      Assessment & Plan:   Principal Problem:   Rotator cuff arthropathy Active Problems:   CAD (coronary artery disease)   Primary myelofibrosis (HCC)   Tobacco abuse   Depression   Seizure-like activity (HCC)   Thrombocytopenia (HCC)   Anemia in ESRD (end-stage renal disease) (HCC)   Hyperkalemia   Leukocytosis   Status post shoulder replacement   ESRD on peritoneal dialysis ([place])    Rotator cuff arthropathy and status post shoulder replacement: 1 day post leftreverse total shoulder arthroplasty. -pain mx per primary team scd for dvt ppx F/u ortho in 2 weeks   Seizure-like activity: Dr.Khaliqdina of neuro is consulted. Likely due to focal seizure per Dr. Lorrin Goodell. -seizure precaution -3/2-MRI pending. Was given Keppra 20 mg/kg IV x1 Keppra 500 mg twice daily EEG   CAD (coronary artery disease): No chest pain Patient not taking aspirin or  statin   ESRD on peritoneal dialysis: -Dr. Holley Raring of renal is consulted for dialysis Patient on peritoneal dialysis  Urinary Retention- Patient had bladder scan of 600 cc.  Nursing could not do pain and out cath.  Unable to Place Foley with difficulty. Urology was consulted spoke to Dr. Erlene Quan via chat.  She would like a pelvic ultrasound prior to seeing the patient.  Primary myelofibrosis ([place]): Patient used to be on Jakafi, which was stopped due to side effects of weakness per his wife -Follow-up with Dr. Rogue Bussing  Tobacco abuse -Nicotine patch  Depression -Continue home medications  Thrombocytopenia ([place]): This is chronic issues.  Platelet of 121. -Follow-up with CBC  Anemia in ESRD (end-stage renal disease) ([place]): Hemoglobin 8.6 (9.5 before surgery) -Follow-up with CBC  Hyperkalemia:  -Dialysis per renal  Leukocytosis: Mild leukocytosis with WBC 11.4, no source of infection identified, likely reactive F/u am cbc              LOS: 2 days   Time spent: 45 min with >50% on coc    Nolberto Hanlon, MD [facility] Pager [phone]  If 7PM-7AM, please contact night-coverage 11/21/2020, 5:20 PM

## 2020-11-21 NOTE — Progress Notes (Signed)
Attempted twice to in and out cath patient and unable to advance catheter. Made Dr. Kurtis Bushman aware of attempt and that patient has almost 600 cc of urine in bladder when bladder scanned. MD acknowledged and stated she would have urology come see patient.

## 2020-11-21 NOTE — Consult Note (Addendum)
Urology Consult  I have been asked to see the patient by Dr. Kurtis Bushman, for evaluation and management of possible urinary retention.  Chief Complaint: not voiding  History of Present Illness: James Arnold is a 65 y.o. year old male admitted following rotator cuff surgery with seizure-like activity.  He is currently in the ICU.  I was contacted earlier today due to the concern for abnormal bladder scans.  The patient has end-stage renal disease is on peritoneal dialysis which was started again yesterday.  Review of chart indicates that there is some concern for him having low urine output yesterday with straight catheter for 450 cc yesterday around 3 pm.  He is otherwise made 1850 cc over the past 24 hours.  Per ICU nurse, he was also cathed at 3 AM for unknown volume, unable to locate documentation for this.  Patient self reports that he is not voided spontaneously over the past 24 hours.  Throughout the day, the patient has been bladder scan indicating high volumes.  When contacted this AM re: this patient, formal pelvic ultrasound was recommended as bladder scan with patients on peritoneal dialysis is contraindicated/ inaccurate.  The patient is not uncomfortable.  He reports that he occasionally has a twinge urinate but this is not severe or painful.  He has no urge to urinate at this time.  Formal ultrasound indicates that he does fact have ~800 cc in his bladder.  Despite being on peritoneal dialysis, he does void spontaneously daily.  Personal history of urinary retention.  No urinary issues.  Past Medical History:  Diagnosis Date  . Anemia   . Benign hypertensive renal disease   . CAD (coronary artery disease)    a. 08/1996 s/p BMS to the mLAD ([facility]); b. 12/2017 MV: EF 46%, fixed apical defect w/ significant GI uptake artifact. No ischemia-> low risk.  . CKD (chronic kidney disease), stage V ([place])    on peritoneal dialysis  . Complication of anesthesia    please call by  "[name]" when waking up!!  . COPD (chronic obstructive pulmonary disease) ([place])    centrilobular emphysema. pt is poorly controlled with his copd/smoking.  . Depression   . Diastolic dysfunction    a. 12/2017 Echo: EF 60-65%, no rwma, Gr1 DD, mild AI/MR. Nl RVSP.  [name] Kitchen Dyspnea    with exertion  . Fatigue   . FSGS (focal segmental glomerulosclerosis)   . Hyperlipidemia 10/2002  . Hypertension   . myelofibrosis    bone marrow failure  . 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 Myocardial infarction ([place]) 1997   stent x 1  . Smoker   . Thrombocytopenia ([place])     Past Surgical History:  Procedure Laterality Date  . ANGIOPLASTY    . BONE MARROW ASPIRATION  03/2009  . CARDIAC CATHETERIZATION  1997   stent x 1  . COLONOSCOPY WITH PROPOFOL N/A 08/09/2018   Procedure: COLONOSCOPY WITH PROPOFOL;  Surgeon: [name], MD;  Location: [facility] ENDOSCOPY;  Service: Gastroenterology;  Laterality: N/A;  . CORONARY STENT PLACEMENT  08/1996  . EXTERIORIZATION OF A CONTINUOUS AMBULATORY PERITONEAL DIALYSIS CATHETER    . EYE SURGERY Bilateral    cats removed  . REVERSE SHOULDER ARTHROPLASTY Left 11/19/2020   Procedure: Left reverse shoulder arthroplasty;  Surgeon: Leim Fabry, MD;  Location: [facility] ORS;  Service: Orthopedics;  Laterality: Left;    Home Medications:  Current Meds  Medication Sig  . amLODipine (NORVASC) 10 MG tablet Take  1 tablet (10 mg total) by mouth daily.  . carvedilol (COREG) 12.5 MG tablet Take 1.5 tablets (18.75 mg total) by mouth 2 (two) times daily.  . Cholecalciferol (VITAMIN D-1000 MAX ST) 25 MCG (1000 UT) tablet Take 1,000 Units by mouth daily.   . citalopram (CELEXA) 40 MG tablet TAKE 1 TABLET EVERY DAY (Patient taking differently: Take 40 mg by mouth daily.)  . epoetin alfa-epbx (RETACRIT) 21194 UNIT/ML injection Inject into the skin. Once a month at dialysis  . ferrous sulfate 325 (65 FE) MG tablet Take 1 tablet (325 mg total) by mouth daily with breakfast.  .  furosemide (LASIX) 20 MG tablet Take 1 tablet (20 mg total) by mouth daily.  [name] Kitchen gentamicin cream (GARAMYCIN) 0.1 % Apply 1 application topically every other day.  . losartan (COZAAR) 50 MG tablet Take 1 tablet (50 mg total) by mouth daily.  . nicotine (NICODERM CQ) 14 mg/24hr patch Place 1 patch (14 mg total) onto the skin daily.  . predniSONE (DELTASONE) 10 MG tablet Take 10 mg by mouth daily with breakfast.  . ruxolitinib phosphate (JAKAFI) 15 MG tablet Take 1 tablet (15 mg total) by mouth daily. Take AFTER dialysis.  [name] Kitchen traMADol (ULTRAM) 50 MG tablet Take 50 mg by mouth every 12 (twelve) hours as needed for moderate pain.  . varenicline (CHANTIX CONTINUING MONTH PAK) 1 MG tablet Take 1 tablet (1 mg total) by mouth 2 (two) times daily.  . varenicline (CHANTIX STARTING MONTH PAK) 0.5 MG X 11 & 1 MG X 42 tablet Take one 0.5 mg tablet by mouth once daily for 3 days, then increase to one 0.5 mg tablet twice daily for 4 days, then increase to one 1 mg tablet twice daily.  . [DISCONTINUED] acetaminophen (TYLENOL) 500 MG tablet Take 1,000 mg by mouth every 6 (six) hours as needed for moderate pain.  . [DISCONTINUED] aspirin EC 81 MG tablet Take 81 mg by mouth daily.    Allergies: No Known Allergies  Family History  Problem Relation Age of Onset  . Stroke Mother        Low BP stroke  . Depression Father   . Depression Sister        Breast  . Heart disease Other   . Breast cancer Other   . Lung cancer Other   . Ovarian cancer Other   . Stomach cancer Other     Social History:  reports that he has been smoking cigarettes. He has a 35.25 pack-year smoking history. He has never used smokeless tobacco. He reports previous alcohol use. He reports that he does not use drugs.  ROS: A complete review of systems was performed.  All systems are negative except for pertinent findings as noted.  Physical Exam:  Vital signs in last 24 hours: Temp:  [97.4 F (36.3 C)-97.9 F (36.6 C)] 97.4 F (36.3  C) (03/02 1535) Pulse Rate:  [73-101] 87 (03/02 1532) Resp:  [11-35] 13 (03/02 1532) BP: (105-156)/(65-92) 121/70 (03/02 1532) SpO2:  [91 %-99 %] 92 % (03/02 1532) Constitutional:  Alert and oriented, No acute distress HEENT: [place] AT, moist mucus membranes.  Trachea midline, no masses Cardiovascular: Regular rate and rhythm, no clubbing, cyanosis, or edema. GU: No CVA tenderness Skin: No rashes, bruises or suspicious lesions GU: No suprapubic tenderness.  Normal circumcised phallus. Neurologic: Grossly intact, no focal deficits, moving all 4 extremities Psychiatric: Normal mood and affect   Laboratory Data:  Recent Labs    11/19/20 0626 11/20/20 0423  WBC  --  11.4*  HGB 9.5* 8.6*  HCT 28.0* 25.6*   Recent Labs    11/19/20 0626 11/20/20 0423  NA 137 134*  K 4.6 5.2*  CL 105 103  CO2  --  18*  GLUCOSE 100* 125*  BUN 71* 78*  CREATININE 7.30* 7.95*  CALCIUM  --  7.6*     Radiologic Imaging: Pelvic ultrasound was personally reviewed.  Radiologic interpretation is pending but there is about 800 cc of urine in his bladder.  Prostate is mildly enlarged.  Procedure: Patient was prepped and draped in a sterile under sterile fashion.  An 81 French coud was placed which went in quite easily with return of large volume clear yellow urine.  Secured to the patient's left thigh.  Impression/Plan: 65 year old male with urinary retention, difficulty cathing patient.  We had a frank discussion today prior to Foley catheter placement.  Given that he he is not, uncomfortable already in end-stage renal disease on peritoneal dialysis, could wait to place a catheter once he becomes uncomfortable.  He may end up voiding spontaneously ultimately.  That being said, nursing staff has been having difficulty cathing him and if he becomes uncomfortable overnight, may require intervention.  We discussed the risk of placing versus not placing a Foley catheter at this time including the risk of  infection, urethral trauma, etc.  Ultimately, he would like to go ahead and  have a catheter placed at this time.  Catheter was placed easily. Plan to leave this in place about 48 hours and start him on Flomax and then proceed with voiding trial.  If he has have catheter replaced, would recommend replacing with a coud as this went quite easily.  Recommend outpatient follow-up either with or without catheter depending on whether or not he is successful in his voiding trial.  -voiding trial on Friday AM -Flomax 0.4 ordered -outpt f/u -please do not use bladder scan to evaluate for retention, it will be inaccurate for patients on peritoneal dialysis  Urology will see this patient as needed   11/21/2020, 4:47 PM  Hollice Espy,  MD

## 2020-11-21 NOTE — Progress Notes (Signed)
Dr. Lorrin Goodell called back and is aware of MRI results.

## 2020-11-21 NOTE — Progress Notes (Signed)
Spoke with Dr. Erlene Quan and made her aware of bladder scan result of > 900. MD informed this RN that bladder scans do not reflect accurate bladders of peritoneal dialysis patients and that a pelvic ultrasound was ordered and that she would make her decision about whether or not to place a catheter based off results of ultrasound. This RN informed patient and his wife of what Dr. Erlene Quan stated. Patient continues to deny discomfort of bladder. Called ultrasound and asked them to come do ultrasound asap.

## 2020-11-22 ENCOUNTER — Inpatient Hospital Stay: Payer: Medicare HMO

## 2020-11-22 ENCOUNTER — Inpatient Hospital Stay (HOSPITAL_COMMUNITY)
Admission: RE | Admit: 2020-11-22 | Discharge: 2020-11-22 | Disposition: A | Discharge status: Home / Self Care | Payer: Medicare HMO | Source: Home / Self Care | Attending: Neurology | Admitting: Neurology

## 2020-11-22 DIAGNOSIS — I6389 Other cerebral infarction: Secondary | ICD-10-CM | POA: Diagnosis not present

## 2020-11-22 DIAGNOSIS — F32A Depression, unspecified: Secondary | ICD-10-CM

## 2020-11-22 DIAGNOSIS — N186 End stage renal disease: Secondary | ICD-10-CM | POA: Diagnosis not present

## 2020-11-22 DIAGNOSIS — M12812 Other specific arthropathies, not elsewhere classified, left shoulder: Secondary | ICD-10-CM | POA: Diagnosis not present

## 2020-11-22 DIAGNOSIS — Z992 Dependence on renal dialysis: Secondary | ICD-10-CM | POA: Diagnosis not present

## 2020-11-22 DIAGNOSIS — D72829 Elevated white blood cell count, unspecified: Secondary | ICD-10-CM | POA: Diagnosis not present

## 2020-11-22 LAB — ECHOCARDIOGRAM COMPLETE BUBBLE STUDY
AR max vel: 2.6 cm2
AV Area VTI: 3.51 cm2
AV Area mean vel: 2.64 cm2
AV Mean grad: 3 mmHg
AV Peak grad: 6 mmHg
Ao pk vel: 1.22 m/s
Area-P 1/2: 2.9 cm2
S' Lateral: 3.47 cm

## 2020-11-22 LAB — RENAL FUNCTION PANEL
Albumin: 2.6 g/dL — ABNORMAL LOW (ref 3.5–5.0)
Anion gap: 10 (ref 5–15)
BUN: 68 mg/dL — ABNORMAL HIGH (ref 8–23)
CO2: 22 mmol/L (ref 22–32)
Calcium: 7.6 mg/dL — ABNORMAL LOW (ref 8.9–10.3)
Chloride: 100 mmol/L (ref 98–111)
Creatinine, Ser: 7.57 mg/dL — ABNORMAL HIGH (ref 0.61–1.24)
GFR, Estimated: 7 mL/min — ABNORMAL LOW (ref >60)
Glucose, Bld: 96 mg/dL (ref 70–99)
Phosphorus: 7.8 mg/dL — ABNORMAL HIGH (ref 2.5–4.6)
Potassium: 4.6 mmol/L (ref 3.5–5.1)
Sodium: 132 mmol/L — ABNORMAL LOW (ref 135–145)

## 2020-11-22 LAB — CBC
HCT: 21.9 % — ABNORMAL LOW (ref 39.0–52.0)
Hemoglobin: 7.2 g/dL — ABNORMAL LOW (ref 13.0–17.0)
MCH: 30 pg (ref 26.0–34.0)
MCHC: 32.9 g/dL (ref 30.0–36.0)
MCV: 91.3 fL (ref 80.0–100.0)
Platelets: 89 10*3/uL — ABNORMAL LOW (ref 150–400)
RBC: 2.4 MIL/uL — ABNORMAL LOW (ref 4.22–5.81)
RDW: 16.1 % — ABNORMAL HIGH (ref 11.5–15.5)
WBC: 4.2 10*3/uL (ref 4.0–10.5)
nRBC: 0 % (ref 0.0–0.2)

## 2020-11-22 MED ORDER — ATORVASTATIN CALCIUM 20 MG PO TABS
20.0000 mg | ORAL_TABLET | Freq: Every day | ORAL | Status: DC
  Filled 2020-11-22 (×2): qty 1

## 2020-11-22 MED ORDER — STROKE: EARLY STAGES OF RECOVERY BOOK: Freq: Once | Status: AC

## 2020-11-22 NOTE — Progress Notes (Signed)
Subjective: 3 Days Post-Op Procedure(s) (LRB): Left reverse shoulder arthroplasty (Left) Patient reports pain as moderate.   Patient is having problems with seizure and tremor activity, but improving.  Feeling much better this AM.   Plan is to go Home after hospital stay. Negative for chest pain and shortness of breath Fever: no Gastrointestinal:negative for nausea and vomiting  Objective: Vital signs in last 24 hours: Temp:  [97.1 F (36.2 C)-97.9 F (36.6 C)] 97.9 F (36.6 C) (03/03 0400) Pulse Rate:  [79-101] 95 (03/03 0600) Resp:  [10-35] 13 (03/03 0600) BP: (96-150)/(57-83) 133/59 (03/03 0600) SpO2:  [91 %-99 %] 98 % (03/03 0600)  Intake/Output from previous day:  Intake/Output Summary (Last 24 hours) at 11/22/2020 0647 Last data filed at 11/22/2020 0554 Gross per 24 hour  Intake --  Output 1200 ml  Net -1200 ml    Intake/Output this shift: Total I/O In: -  Out: 400 [Urine:400]  Labs: Recent Labs    11/20/20 0423  HGB 8.6*   Recent Labs    11/20/20 0423  WBC 11.4*  RBC 2.86*  HCT 25.6*  PLT 121*   Recent Labs    11/20/20 0423  NA 134*  K 5.2*  CL 103  CO2 18*  BUN 78*  CREATININE 7.95*  GLUCOSE 125*  CALCIUM 7.6*   No results for input(s): LABPT, INR in the last 72 hours.   EXAM General - Patient is Alert and Oriented Extremity - Neurovascular intact Sensation intact distally Dorsiflexion/Plantar flexion intact Dressing/Incision - clean, dry, intact.  The shoulder immobilizer is in place.  Polar care is in place.   Motor Function - intact, moving fingers well on exam. Grip intact.  Able to give elbow posterior pressure.    Past Medical History:  Diagnosis Date  . Anemia   . Benign hypertensive renal disease   . CAD (coronary artery disease)    a. 08/1996 s/p BMS to the mLAD ([facility]); b. 12/2017 MV: EF 46%, fixed apical defect w/ significant GI uptake artifact. No ischemia-> low risk.  . CKD (chronic kidney disease), stage V ([place])    on  peritoneal dialysis  . Complication of anesthesia    please call by "[name]" when waking up!!  . COPD (chronic obstructive pulmonary disease) ([place])    centrilobular emphysema. pt is poorly controlled with his copd/smoking.  . Depression   . Diastolic dysfunction    a. 12/2017 Echo: EF 60-65%, no rwma, Gr1 DD, mild AI/MR. Nl RVSP.  [name] Kitchen Dyspnea    with exertion  . Fatigue   . FSGS (focal segmental glomerulosclerosis)   . Hyperlipidemia 10/2002  . Hypertension   . myelofibrosis    bone marrow failure  . 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 Myocardial infarction ([place]) 1997   stent x 1  . Smoker   . Thrombocytopenia ([facility])     Assessment/Plan: 3 Days Post-Op Procedure(s) (LRB): Left reverse shoulder arthroplasty (Left) Principal Problem:   Rotator cuff arthropathy Active Problems:   CAD (coronary artery disease)   Primary myelofibrosis ([facility])   Tobacco abuse   Depression   Seizure-like activity ([facility])   Thrombocytopenia ([facility])   Anemia in ESRD (end-stage renal disease) ([facility])   Hyperkalemia   Leukocytosis   Status post shoulder replacement   ESRD on peritoneal dialysis ([place])   Urinary retention  Estimated body mass index is 26.04 kg/m as calculated from the following:   Height as of this encounter: 5' 6.34" (1.685 m).   Weight  as of this encounter: 73.9 kg. Advance diet Up with therapy  Appreciate Hospitalist assistance.  Appreciate Neurology assessment.    PT/OT Left UE, gentle ROM.     DVT Prophylaxis - Aspirin   Reche Dixon, PA-C Orthopaedic Surgery 11/22/2020, 6:47 AM     ADDENDUM: Patient reevaluated this a.m. Previous, more significant seizure-like activity has mostly resolved. The patient is not endorsing any further significant seizure-like activity at this time. He underwent peritoneal dialysis last night. He is scheduled to get brain MRI later this morning as he was undergoing dialysis last night. Neurology recommendations pending.

## 2020-11-22 NOTE — Evaluation (Addendum)
Clinical/Bedside Swallow Evaluation Patient Details  Name: James Arnold MRN: [id number] Date of Birth: May 24, 1956  Today's Date: 11/22/2020 Time: SLP Start Time (ACUTE ONLY): 0850 SLP Stop Time (ACUTE ONLY): 0910 SLP Time Calculation (min) (ACUTE ONLY): 20 min  Past Medical History:  Past Medical History:  Diagnosis Date  . Anemia   . Benign hypertensive renal disease   . CAD (coronary artery disease)    a. 08/1996 s/p BMS to the mLAD ([facility]); b. 12/2017 MV: EF 46%, fixed apical defect w/ significant GI uptake artifact. No ischemia-> low risk.  . CKD (chronic kidney disease), stage V ([place])    on peritoneal dialysis  . Complication of anesthesia    please call by "[name]" when waking up!!  . COPD (chronic obstructive pulmonary disease) ([place])    centrilobular emphysema. pt is poorly controlled with his copd/smoking.  . Depression   . Diastolic dysfunction    a. 12/2017 Echo: EF 60-65%, no rwma, Gr1 DD, mild AI/MR. Nl RVSP.  [name] Kitchen Dyspnea    with exertion  . Fatigue   . FSGS (focal segmental glomerulosclerosis)   . Hyperlipidemia 10/2002  . Hypertension   . myelofibrosis    bone marrow failure  . 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 Myocardial infarction ([place]) 1997   stent x 1  . Smoker   . Thrombocytopenia ([place])    Past Surgical History:  Past Surgical History:  Procedure Laterality Date  . ANGIOPLASTY    . BONE MARROW ASPIRATION  03/2009  . CARDIAC CATHETERIZATION  1997   stent x 1  . COLONOSCOPY WITH PROPOFOL N/A 08/09/2018   Procedure: COLONOSCOPY WITH PROPOFOL;  Surgeon: [name], MD;  Location: [facility] ENDOSCOPY;  Service: Gastroenterology;  Laterality: N/A;  . CORONARY STENT PLACEMENT  08/1996  . EXTERIORIZATION OF A CONTINUOUS AMBULATORY PERITONEAL DIALYSIS CATHETER    . EYE SURGERY Bilateral    cats removed  . REVERSE SHOULDER ARTHROPLASTY Left 11/19/2020   Procedure: Left reverse shoulder arthroplasty;  Surgeon: Leim Fabry, MD;  Location: [facility] ORS;   Service: Orthopedics;  Laterality: Left;   HPI:  Pt is a 65 year old male who is 3 days Post-Op left reverse total should arthroplasty with new onset seizure activity. MRI on 11/21/2020 revealed patchy acute/early subacute cortical infarcts within the right frontal, parietal and occipital lobes (MCA vascular territory, as well as MCA/PCA watershed territory and right PCA territory). Scattered superimposed microhemorrhages these sites.   Assessment / Plan / Recommendation Clinical Impression  Pt demonstrated adequate oropharyngeal abilities when consuming regular and puree textures as well as thin liquids via cup and straw. He had no overt s/s of aspiration at bedside. After consuming 2 small boluses of pancakes and sausage, pt politely refused further intake. He stated, "it's stuck right here (pointing to mid-sternum), it won't go down and I don't want to risk throwing it up." Pt and his wife provided further information: pt had an unintentional weight loss of ~ 30 lbs prior to admission, he has severe globus sensation with every meal, purees and liquids result in a lesser globus sensation, at least twice per week he "throws up" after he eats. As such, pt is at a high risk of postprandial aspiration. At this time recommend a GI consult.  ST will sign off d/t no s/s of oropharyngeal dysphagia.    Pt's cognitive communication is considered at baseline.    SLP Visit Diagnosis: Dysphagia, unspecified (R13.10)    Aspiration Risk   (risk  of postprandial aspiration)    Diet Recommendation Regular;Thin liquid   Liquid Administration via: Cup;Straw Medication Administration:  (defer to pt preference) Supervision: Patient able to self feed Compensations: Minimize environmental distractions;Slow rate;Small sips/bites Postural Changes: Seated upright at 90 degrees;Remain upright for at least 30 minutes after po intake    Other  Recommendations Recommended Consults: Consider GI evaluation Oral Care  Recommendations: Oral care BID   Follow up Recommendations None        Swallow Study   General Date of Onset: 11/20/20 HPI: Pt is a 65 year old male who is 3 days Post-Op left reverse total should arthroplasty with new onset seizure activity. MRI on 11/21/2020 revealed patchy acute/early subacute cortical infarcts within the right frontal, parietal and occipital lobes (MCA vascular territory, as well as MCA/PCA watershed territory and right PCA territory). Scattered superimposed microhemorrhages these sites. Previous Swallow Assessment: none in chart Diet Prior to this Study: NPO (however regular breakfast tray was present) Temperature Spikes Noted: No Respiratory Status: Room air Behavior/Cognition: Alert;Cooperative;Pleasant mood Oral Cavity Assessment: Within Functional Limits Oral Care Completed by SLP: No Oral Cavity - Dentition: Adequate natural dentition Vision: Functional for self-feeding Self-Feeding Abilities: Able to feed self Patient Positioning: Upright in bed Baseline Vocal Quality: Normal Volitional Cough: Strong Volitional Swallow: Able to elicit    Oral/Motor/Sensory Function Overall Oral Motor/Sensory Function: Within functional limits   Ice Chips Ice chips: Not tested   Thin Liquid Thin Liquid: Within functional limits Presentation: Self Fed;Cup;Straw    Nectar Thick Nectar Thick Liquid: Not tested   Honey Thick Honey Thick Liquid: Not tested   Puree Puree: Within functional limits Presentation: Self Fed;Spoon   Solid     Solid: Within functional limits Presentation: Self Fed;Spoon     Rishabh Rinkenberger B. Rutherford Nail M.S., CCC-SLP, [place] Pathologist Rehabilitation Services Office [phone]  Shawna Kiener Rutherford Nail 11/22/2020,1:24 PM

## 2020-11-22 NOTE — Evaluation (Addendum)
Occupational Therapy Evaluation Patient Details Name: James Arnold MRN: [id number] DOB: [birth year] Today's Date: 11/22/2020    History of Present Illness Pt is a 65 yo male s/p L reverse TSA. PMH of smoking, COPD, past MI, cardiac stents, depression, nightly peritoneal dialysis. Later after surgery pt  developed muscle twitching in his left paraspinal muscles postoperatively and was also witnessed as having generalized twitching and was felt to have a seizure episode. Subsequently moved over to the critical care unit. MRI showed Patchy acute/early subacute cortical infarcts within the right frontal, parietal and occipital lobes (MCA vascular territory, as well as MCA/PCA watershed territory and right PCA territory).  Scattered superimposed microhemorrhages these sites. Small chronic left basal ganglia lacunar infarct.   Clinical Impression   Pt seen for OT evaluation this date in setting of acute hospitalization for R TSA of L shoulder with subsequent post-operative complications including seizure. Pt assessed in ICU this date with his spouse present throughout. He reports being INDEP at baseline with just some decreased functionality of his  L UE d/t pain. On assessment this date, pt requires MOD A for UB ADLs and MAX A for LB ADLs 2/2 precautions as well as decreased sensation and ROM/strength of L hand/wrist/elbow post-op'ly (see extremity section for more detailed coverage). OT educates pt and spouse re: sling wear, polar care mgt, ROM, pendulums, safe dependent ROM that he can perform for ADL purposes, sleep recommendations, & dressing and bathing modifications. Pt and spouse with good understanding. Handout issued. Both parties with good understanding. While pt has good home support, he will likely require extensive therapy given post-op complications. Both pt and spouse adamantly refuse rehabilitation at a facility at this time, but it would be best/most ideal choice given pt's current functional  status. Will continue to follow acutely. Should pt go home, will needs the below-listed DME as well as HHOT f/u.     Follow Up Recommendations  SNF    Equipment Recommendations  3 in 1 bedside commode;Tub/shower seat;Other (comment) (hemi walker)    Recommendations for Other Services       Precautions / Restrictions Precautions Precautions: Fall;Shoulder Shoulder Interventions: Shoulder sling/immobilizer;Shoulder abduction pillow;At all times;Off for dressing/bathing/exercises Precaution Booklet Issued: No Precaution Comments: seizures Required Braces or Orthoses: Sling Restrictions Weight Bearing Restrictions: Yes LUE Weight Bearing: Non weight bearing      Mobility Bed Mobility               General bed mobility comments: deferred. pt citing fatigue.    Transfers                 General transfer comment: deferred. pt citing fatigue.    Balance       Sitting balance - Comments: deferred. pt citing fatigue.       Standing balance comment: deferred. pt citing fatigue.                           ADL either performed or assessed with clinical judgement   ADL Overall ADL's : Needs assistance/impaired                                       General ADL Comments: Pt requires MOD A for UB ADLs, MAX A for LB ADLs currently d/t precautions and pain in L UE as well as decreased senation and ROM to L hand/wrist.  All ADLs assessed at bed level this date as pt is very fatigued from several tests and walking with PT.     Vision Baseline Vision/History: Wears glasses Patient Visual Report: No change from baseline       Perception     Praxis      Pertinent Vitals/Pain Pain Assessment: Faces Faces Pain Scale: Hurts little more Pain Location: L shoulder with PROM and slin repositioning Pain Descriptors / Indicators: Grimacing;Operative site guarding Pain Intervention(s): Limited activity within patient's tolerance;Monitored during  session;Repositioned;Ice applied     Hand Dominance Right   Extremity/Trunk Assessment Upper Extremity Assessment Upper Extremity Assessment: RUE deficits/detail;LUE deficits/detail RUE Deficits / Details: WFLs LUE Deficits / Details: NWB per precautions, pt noted to have decreased L grip strength (noted to engage in elbow flexion when attempting to open/close hand). Limited AROM for elbow flex/ext with MMT 2-/5. Grip 2-/5. Pt endorses that this is concerning as he was INDEP prior to sx. Unable to supinate/pronate forearm. Tolerates all hand/wrist ROM passively, but less tolerance for elbow PROM. LUE Sensation: decreased light touch;decreased proprioception (pt reports "feels far away" not equal to R side.) LUE Coordination: decreased fine motor;decreased gross motor   Lower Extremity Assessment Lower Extremity Assessment: Defer to PT evaluation (pt endorses his balance is worse right now, but states sensation appears the same. declines to get OOB with OT, will require further coorindation assessment in sitting/standing.)       Communication Communication Communication: No difficulties   Cognition Arousal/Alertness: Awake/alert Behavior During Therapy: WFL for tasks assessed/performed Overall Cognitive Status: Within Functional Limits for tasks assessed                                     General Comments       Exercises Other Exercises Other Exercises: OT educates re: sling wear, polar care mgt, ROM, pendulums, safe dependent ROM that he can perform for ADL purposes, sleep recommendations, & dressing and bathing modifications. Pt and spouse with good understanding. Handout issued.   Shoulder Instructions      Home Living Family/patient expects to be discharged to:: Private residence Living Arrangements: Spouse/significant other Available Help at Discharge: Family Type of Home: House Home Access: Level entry     Home Layout: One level                Home Equipment: None          Prior Functioning/Environment Level of Independence: Independent                 OT Problem List: Decreased strength;Decreased range of motion;Decreased activity tolerance;Impaired balance (sitting and/or standing);Decreased coordination;Decreased knowledge of use of DME or AE;Decreased knowledge of precautions;Impaired UE functional use;Pain      OT Treatment/Interventions: Self-care/ADL training;DME and/or AE instruction;Therapeutic activities;Balance training;Therapeutic exercise;Neuromuscular education;Energy conservation;Patient/family education    OT Goals(Current goals can be found in the care plan section) Acute Rehab OT Goals Patient Stated Goal: to get back to normal OT Goal Formulation: With patient/family Time For Goal Achievement: 12/06/20 Potential to Achieve Goals: Fair ADL Goals Pt Will Perform Upper Body Bathing: with min guard assist;with min assist;sitting (with modified technique.) Pt Will Perform Upper Body Dressing: with min guard assist;with min assist;sitting (with modified technique.) Pt/caregiver will Perform Home Exercise Program: With written HEP provided (pendulum and ROM with spouse assist PRN with 0% cues for form/technique) Additional ADL Goal #1: Pt and spouse will  demo good understanding of safe sling and polar care application with no cues from OT.  OT Frequency: Min 1X/week   Barriers to D/C:            Co-evaluation              AM-PAC OT "6 Clicks" Daily Activity     Outcome Measure Help from another person eating meals?: A Little Help from another person taking care of personal grooming?: A Little Help from another person toileting, which includes using toliet, bedpan, or urinal?: A Lot Help from another person bathing (including washing, rinsing, drying)?: A Lot Help from another person to put on and taking off regular upper body clothing?: A Lot Help from another person to put on and taking off  regular lower body clothing?: A Lot 6 Click Score: 14   End of Session Nurse Communication: Mobility status  Activity Tolerance: Patient tolerated treatment well Patient left: in bed;with call bell/phone within reach;with family/visitor present  OT Visit Diagnosis: Other abnormalities of gait and mobility (R26.89);Muscle weakness (generalized) (M62.81);Other symptoms and signs involving the nervous system (R29.898);Hemiplegia and hemiparesis;Pain Hemiplegia - Right/Left: Left Hemiplegia - dominant/non-dominant: Non-Dominant Hemiplegia - caused by: Unspecified Pain - Right/Left: Left Pain - part of body: Shoulder                Time: 1449-1550 OT Time Calculation (min): 61 min Charges:  OT General Charges $OT Visit: 1 Visit OT Treatments $Self Care/Home Management : 23-37 mins $Therapeutic Activity: 23-37 mins  Gerrianne Scale, MS, OTR/L ascom [phone] 11/22/20, 6:40 PM

## 2020-11-22 NOTE — Evaluation (Addendum)
Physical Therapy Re-Evaluation Patient Details Name: James Arnold MRN: [id number] DOB: 21-Jun-1956 Today's Date: 11/22/2020   History of Present Illness  Pt is a 65 yo male s/p L reverse TSA. PMH of smoking, COPD, past MI, cardiac stents, depression, nightly peritoneal dialysis. Later after surgery pt  developed muscle twitching in his left paraspinal muscles postoperatively and was also witnessed as having generalized twitching and was felt to have a seizure episode. Subsequently moved over to the critical care unit. MRI showed Patchy acute/early subacute cortical infarcts within the right frontal, parietal and occipital lobes (MCA vascular territory, as well as MCA/PCA watershed territory and right PCA territory).  Scattered superimposed microhemorrhages these sites. Small chronic left basal ganglia lacunar infarct.    Clinical Impression  Pt seen as a re-evaluation due to change in status. Pt A&Ox4, in bed, wife at  bedside. Pt reported discomfort at rest, and increased L shoulder pain with PROM. Prior to surgery/hospital admission, pt was independent. Wife/pt confirmed that they have family around to assist if needed.  Significant time spent with pt to address dressing, polar care, sling positioning, mobility, ambulation, balance deficits, AD, as well as L shoulder PROM s/p reverse TSA. The patient demonstrated bed mobility CGA/minA (encouraged to sleep in a recliner upon discharge for safety/comfort). Several sit <> stands performed with varying level of AD, CGA with verbal cueing for hand placement each time. He was able to ambulate ~229ft with handheld assist, quad cane, hemiwalker. Pt exhibited general unsteadiness, CGA-minA to maintain balance. hemiwalker most functional and safest for pt at this time, educated on use and placement while ambulating, would benefit from further reinforcement. Returned to bed and to supine for PROM exercises of L shoulder. Of note, pt with decreased ability to grip L  hand as well as decreased L wrist flexion/extension as well. MD notified.  Overall the patient demonstrated deficits (see "PT Problem List") that impede the patient's functional abilities, safety, and mobility and would benefit from skilled PT intervention. Recommendation is to follow surgeon's recommendation for DC planning. MD also informed of potential need for HHPT prior to outpatient PT at this time due to patient unsteadiness/safety concerns.      Follow Up Recommendations Follow surgeon's recommendation for DC plan and follow-up therapies;Home health PT with 24/7 supervision/assistance    Equipment Recommendations  Other (comment) (hemiwalker)    Recommendations for Other Services OT consult     Precautions / Restrictions Precautions Precautions: Fall;Shoulder Shoulder Interventions: Shoulder sling/immobilizer;Shoulder abduction pillow;At all times;Off for dressing/bathing/exercises Precaution Booklet Issued: No Precaution Comments: seizures Required Braces or Orthoses: Sling Restrictions Weight Bearing Restrictions: Yes LUE Weight Bearing: Non weight bearing      Mobility  Bed Mobility Overal bed mobility: Needs Assistance Bed Mobility: Supine to Sit;Sit to Supine     Supine to sit: Supervision;HOB elevated Sit to supine: Min assist   General bed mobility comments: cued for technique to protect L shoulder    Transfers Overall transfer level: Needs assistance Equipment used: 1 person hand held assist;Hemi-walker;Quad cane Transfers: Sit to/from Stand Sit to Stand: Min guard         General transfer comment: performed several times. safest with hemiwalker. cued for hand placement each time  Ambulation/Gait Ambulation/Gait assistance: Min guard Gait Distance (Feet):  (3 bouts of ~93ft, seated rest break in between.) Assistive device: Hemi-walker;1 person hand held assist;Quad cane   Gait velocity: decreased   General Gait Details: Pt ambulation performed  with handheld assist, quad cane, and hemiwalker  Stairs            Wheelchair Mobility    Modified Rankin (Stroke Patients Only)       Balance Overall balance assessment: Needs assistance Sitting-balance support: Feet supported Sitting balance-Leahy Scale: Good     Standing balance support: Single extremity supported Standing balance-Leahy Scale: Fair Standing balance comment: improved balance noted with hemiwalker, pt generally unsteady throughout attempts                             Pertinent Vitals/Pain Pain Assessment: Faces Faces Pain Scale: Hurts a little bit Pain Location: L shoulder with PROM Pain Descriptors / Indicators: Aching;Sore Pain Intervention(s): Limited activity within patient's tolerance;Monitored during session;Repositioned;Ice applied    Home Living Family/patient expects to be discharged to:: Private residence Living Arrangements: Spouse/significant other Available Help at Discharge: Family Type of Home: House Home Access: Level entry     Home Layout: One level Home Equipment: None      Prior Function Level of Independence: Independent               Hand Dominance   Dominant Hand: Right    Extremity/Trunk Assessment   Upper Extremity Assessment Upper Extremity Assessment: Defer to OT evaluation RUE Deficits / Details: WFLs LUE Deficits / Details: NWB, in shoulder sling for all mobility attempts LUE Sensation: WNL    Lower Extremity Assessment RLE Deficits / Details: able to lift against gravity, move to EOB LLE Deficits / Details: able to lift against gravity, move to EOB       Communication   Communication: No difficulties  Cognition Arousal/Alertness: Awake/alert Behavior During Therapy: WFL for tasks assessed/performed Overall Cognitive Status: Within Functional Limits for tasks assessed                                        General Comments      Exercises Other  Exercises Other Exercises: Pt assisted with polar care, sling, and gown donning/doffing as needed during session. Education provided to family as well. Other Exercises: seated LAQ and seated marching x20sec ea. supine PROM of L shoulder, FF to 90deg x 5 minutes, and ER to 30deg x 5 minutes. PROM elbow flexion/extension, AROM wrist extension/flexion, and attempted grip squeezes x1 min ea. Other Exercises: Pt with decreased ability to squeeze L hand. Also with limited AROM of wrist extension/flexion as well. MD notified.   Assessment/Plan    PT Assessment Patient needs continued PT services  PT Problem List Decreased strength;Decreased mobility;Decreased range of motion;Decreased knowledge of precautions;Decreased activity tolerance;Pain       PT Treatment Interventions DME instruction;Therapeutic exercise;Gait training;Balance training;Stair training;Neuromuscular re-education;Functional mobility training;Therapeutic activities;Patient/family education    PT Goals (Current goals can be found in the Care Plan section)  Acute Rehab PT Goals Patient Stated Goal: to get back to normal PT Goal Formulation: With patient Time For Goal Achievement: 12/06/20 Potential to Achieve Goals: Good    Frequency 7X/week   Barriers to discharge        Co-evaluation               AM-PAC PT "6 Clicks" Mobility  Outcome Measure Help needed turning from your back to your side while in a flat bed without using bedrails?: A Little Help needed moving from lying on your back to sitting on the side of a flat  bed without using bedrails?: A Little Help needed moving to and from a bed to a chair (including a wheelchair)?: A Little Help needed standing up from a chair using your arms (e.g., wheelchair or bedside chair)?: A Little Help needed to walk in hospital room?: A Little Help needed climbing 3-5 steps with a railing? : A Little 6 Click Score: 18    End of Session Equipment Utilized During  Treatment: Gait belt Activity Tolerance: Patient tolerated treatment well Patient left: in bed;with call bell/phone within reach;with nursing/sitter in room Nurse Communication: Mobility status PT Visit Diagnosis: Muscle weakness (generalized) (M62.81);Pain;Other abnormalities of gait and mobility (R26.89);Difficulty in walking, not elsewhere classified (R26.2) Pain - Right/Left: Left Pain - part of body: Shoulder    Time: 4469-5072 PT Time Calculation (min) (ACUTE ONLY): 73 min   Charges:   PT Evaluation $PT Re-evaluation: 1 Re-eval PT Treatments $Gait Training: 38-52 mins $Therapeutic Exercise: 23-37 mins        Lieutenant Diego PT, DPT 1:42 PM,11/22/20

## 2020-11-22 NOTE — Progress Notes (Addendum)
   11/20/20 0316  Assess: MEWS Score  BP (!) 178/92  Pulse Rate (!) 122  Resp (!) 22  SpO2 99 %  O2 Device Nasal Cannula  O2 Flow Rate (L/min) 2 L/min  Assess: MEWS Score  MEWS Temp 0  MEWS Systolic 0  MEWS Pulse 2  MEWS RR 1  MEWS LOC 0  MEWS Score 3  MEWS Score Color Yellow  Assess: if the MEWS score is Yellow or Red  Were vital signs taken at a resting state? Yes  Focused Assessment Change from prior assessment (see assessment flowsheet)  Early Detection of Sepsis Score *See Row Information* Low  MEWS guidelines implemented *See Row Information* Yes  Treat  MEWS Interventions Escalated (See documentation below);Administered prn meds/treatments (Zofran IV for vomitting, Ativan for seizure-like activity adm, To CT scan, Placed on 2L [place],)  Pain Scale 0-10  Pain Score 4  Pain Type Surgical pain  Pain Location Shoulder  Pain Orientation Left  Pain Descriptors / Indicators Numbness  Pain Onset On-going  Pain Intervention(s) Medication (See eMAR)  Multiple Pain Sites Yes  Patients response to intervention Relief  Take Vital Signs  Increase Vital Sign Frequency  Yellow: Q 2hr X 2 then Q 4hr X 2, if remains yellow, continue Q 4hrs  Escalate  MEWS: Escalate Yellow: discuss with charge nurse/RN and consider discussing with provider and RRT  Notify: Charge Nurse/RN  Name of Charge Nurse/RN Notified Marcella, RN  Date Charge Nurse/RN Notified 11/20/20  Time Charge Nurse/RN Notified 0300  Notify: Provider  Provider Name/Title Sharion Settler, PA  Date Provider Notified 11/20/20  Time Provider Notified 0300  Notification Type Page  Notification Reason Other (Comment) (SEE NEW ORDERS)  Provider response At bedside  Date of Provider Response 11/20/20  Time of Provider Response 0302  Notify: Rapid Response  Name of Rapid Response RN Notified Madelon Lips, RN  Date Rapid Response Notified 11/20/20  Time Rapid Response Notified 7510  Document  Patient Outcome  Transferred/level of care increased  Progress note created (see row info) Yes

## 2020-11-22 NOTE — Progress Notes (Signed)
Pt had peritoneal dialysis during the night. Pt has remained neurologically intact throughout the night. No changes. Normal pulse and sensory in LUE. Pt denies pain. Good urine output in foley.

## 2020-11-22 NOTE — Progress Notes (Signed)
PROGRESS NOTE    James Arnold  ZYY:[id number] DOB: [birth year] DOA: 11/19/2020 PCP: Valerie Roys, DO    Brief Narrative:  James Arnold an 65 y.o.maleESRD-peritonealdialysis,HTN, HLD, COPD, gout, depression,thrombocytopenia, CAD with stents,dCHF, anemia,myelofibrosis, smoker,admitted for leftreverse total shoulder arthroplastyby ortho team. Pt is 1 day post surgery. We are asked to consult due toseizure-like activity.  TRH consulted for seizure-like activity 3/3-transferred to medicine service.  3/3-pt refused statin due to causing kidney issues. Foley placed on 3/2 by urology .      Consultants:   Ortho, urology, nephrology  Procedures: MRA, MRI, echo, peritoneal dialysis  Antimicrobials:       Subjective: No complaints by pt. Wife at bedside, appears to be in not so friendly mood. Asking "why pt still in icu and when he is going to get his own bed?!"  Objective: Vitals:   11/22/20 1600 11/22/20 1630 11/22/20 1700 11/22/20 1702  BP: 130/72 133/71 137/76 137/76  Pulse: 96 95 94 93  Resp: (!) 22 (!) 23 (!) 27   Temp: 97.9 F (36.6 C)     TempSrc: Axillary     SpO2: 92% 92% 95%   Weight:      Height:        Intake/Output Summary (Last 24 hours) at 11/22/2020 1711 Last data filed at 11/22/2020 1415 Gross per 24 hour  Intake 120 ml  Output 1300 ml  Net -1180 ml   Filed Weights   11/19/20 0614  Weight: 73.9 kg    Examination:  General exam: Appears calm and comfortable  Respiratory system: Clear to auscultation. Respiratory effort normal. Cardiovascular system: S1 & S2 heard, RRR. No JVD, murmurs, rubs, gallops or clicks.  Gastrointestinal system: Abdomen is nondistended, soft and nontender. Foley with yellow urine  Central nervous system: Alert and oriented.   Extremities: Left upper extremity in a sling.  Lower extremity no edema Skin: Warm dry Psychiatry:Mood & affect appropriate.     Data Reviewed: I have personally reviewed  following labs and imaging studies  CBC: Recent Labs  Lab 11/19/20 0626 11/20/20 0423 11/22/20 1223  WBC  --  11.4* 4.2  HGB 9.5* 8.6* 7.2*  HCT 28.0* 25.6* 21.9*  MCV  --  89.5 91.3  PLT  --  121* 89*   Basic Metabolic Panel: Recent Labs  Lab 11/19/20 0626 11/20/20 0423 11/22/20 1223  NA 137 134* 132*  K 4.6 5.2* 4.6  CL 105 103 100  CO2  --  18* 22  GLUCOSE 100* 125* 96  BUN 71* 78* 68*  CREATININE 7.30* 7.95* 7.57*  CALCIUM  --  7.6* 7.6*  MG  --  2.0  --   PHOS  --   --  7.8*   GFR: Estimated Creatinine Clearance: 9 mL/min (A) (by C-G formula based on SCr of 7.57 mg/dL (H)). Liver Function Tests: Recent Labs  Lab 11/20/20 0423 11/22/20 1223  AST 13*  --   ALT 8  --   ALKPHOS 45  --   BILITOT 0.6  --   PROT 5.6*  --   ALBUMIN 2.9* 2.6*   No results for input(s): LIPASE, AMYLASE in the last 168 hours. No results for input(s): AMMONIA in the last 168 hours. Coagulation Profile: No results for input(s): INR, PROTIME in the last 168 hours. Cardiac Enzymes: No results for input(s): CKTOTAL, CKMB, CKMBINDEX, TROPONINI in the last 168 hours. BNP (last 3 results) No results for input(s): PROBNP in the last 8760 hours. HbA1C: Recent Labs  11/21/20 1754  HGBA1C 4.9   CBG: Recent Labs  Lab 11/20/20 0303 11/20/20 0321 11/20/20 0418  GLUCAP 121* 126* 124*   Lipid Profile: Recent Labs    11/21/20 1754  CHOL 142  HDL 25*  LDLCALC 91  TRIG 131  CHOLHDL 5.7   Thyroid Function Tests: No results for input(s): TSH, T4TOTAL, FREET4, T3FREE, THYROIDAB in the last 72 hours. Anemia Panel: No results for input(s): VITAMINB12, FOLATE, FERRITIN, TIBC, IRON, RETICCTPCT in the last 72 hours. Sepsis Labs: No results for input(s): PROCALCITON, LATICACIDVEN in the last 168 hours.  Recent Results (from the past 240 hour(s))  Surgical pcr screen     Status: Abnormal   Collection Time: 11/15/20  8:20 AM   Specimen: Nasal Mucosa; Nasal Swab  Result Value Ref  Range Status   MRSA, PCR NEGATIVE NEGATIVE Final   Staphylococcus aureus POSITIVE (A) NEGATIVE Final    Comment: RESULT CALLED TO, READ BACK BY AND VERIFIED WITH: REED RENO RN AT 1452 ON 11/15/20 SNG (NOTE) The Xpert SA Assay (FDA approved for NASAL specimens in patients 39 years of age and older), is one component of a comprehensive surveillance program. It is not intended to diagnose infection nor to guide or monitor treatment. Performed at [facility], [place], [place] [address]   SARS CORONAVIRUS 2 (TAT 6-24 HRS) Nasopharyngeal Nasopharyngeal Swab     Status: None   Collection Time: 11/15/20  9:25 AM   Specimen: Nasopharyngeal Swab  Result Value Ref Range Status   SARS Coronavirus 2 NEGATIVE NEGATIVE Final    Comment: (NOTE) SARS-CoV-2 target nucleic acids are NOT DETECTED.  The SARS-CoV-2 RNA is generally detectable in upper and lower respiratory specimens during the acute phase of infection. Negative results do not preclude SARS-CoV-2 infection, do not rule out co-infections with other pathogens, and should not be used as the sole basis for treatment or other patient management decisions. Negative results must be combined with clinical observations, patient history, and epidemiological information. The expected result is Negative.  Fact Sheet for Patients: SugarRoll.be  Fact Sheet for Healthcare Providers: https://www.woods-mathews.com/  This test is not yet approved or cleared by the [place] FDA and  has been authorized for detection and/or diagnosis of SARS-CoV-2 by FDA under an Emergency Use Authorization (EUA). This EUA will remain  in effect (meaning this test can be used) for the duration of the COVID-19 declaration under Se ction 564(b)(1) of the Act, 21 U.S.C. section 360bbb-3(b)(1), unless the authorization is terminated or revoked sooner.  Performed at [facility], [place]  [address] [place], [place] [address]          Radiology Studies: MR ANGIO HEAD WO CONTRAST  Result Date: 11/22/2020 CLINICAL DATA:  Neuro deficit, acute, stroke suspected. EXAM: MRA HEAD WITHOUT CONTRAST TECHNIQUE: Angiographic images of the Circle of Willis were obtained using MRA technique without intravenous contrast. COMPARISON:  MRI of the brain November 21, 2020. FINDINGS: Luminal irregularity with mild-to-moderate stenosis at the petrous cavernous segment of the right ICA. Mild luminal irregularity of the cavernous left ICA without hemodynamically significant stenosis. The bilateral anterior cerebral arteries and middle cerebral arteries are widely patent with antegrade flow without high-grade flow-limiting stenosis or proximal branch occlusion. No intracranial aneurysm within the anterior circulation. The vertebral arteries are widely patent with antegrade flow. Vertebrobasilar junction and basilar artery are widely patent with antegrade flow without evidence of basilar stenosis or aneurysm. Posterior cerebral arteries are normal bilaterally. No intracranial aneurysm  within the posterior circulation. IMPRESSION: 1. No evidence of high-grade flow-limiting stenosis, proximal branch occlusion, or intracranial aneurysm. 2. Luminal irregularity with mild-to-moderate stenosis at the petrous cavernous segment of the right ICA. Electronically Signed   By: Pedro Earls M.D.   On: 11/22/2020 15:25   MR BRAIN WO CONTRAST  Result Date: 11/21/2020 CLINICAL DATA:  Seizure, partial. Additional history provided: Patient recently status post total shoulder arthroplasty with subsequent brief possible seizure episode, back to baseline neurologically status post possible seizure. EXAM: MRI HEAD WITHOUT CONTRAST TECHNIQUE: Multiplanar, multiecho pulse sequences of the brain and surrounding structures were obtained without intravenous contrast. COMPARISON:  Head CT 11/20/2020. FINDINGS: Brain: The examination  is intermittently motion degraded limiting evaluation. Most notably, there is moderate motion degradation of the sagittal T1 weighted sequence, mild-to-moderate motion degradation of the axial T2/FLAIR sequence, moderate motion degradation of the axial T1 weighted sequence, severe motion degradation of the coronal T2 weighted sequence oriented perpendicular to the long axis of the hippocampi, severe motion degradation of the coronal T2/FLAIR sequence oriented perpendicular to the long axis of the hippocampi, moderate motion degradation of the whole brain coronal T2 weighted sequence and moderate motion degradation of the coronal T1 weighted sequence oriented perpendicular to the long axis of the hippocampi. Mild cerebral and cerebellar atrophy. There are patchy cortical foci of restricted diffusion within the right cerebral hemisphere likely reflecting acute/early subacute infarcts. These are present within the mid to posterior right frontal lobe (including the right motor strip), as well as right parietooccipital lobes (including the right postcentral gyrus). These infarcts are present within the right MCA vascular territory as well as right MCA/PCA watershed territory and right PCA territory. Corresponding T2/FLAIR hyperintensity at these sites. Additionally, there are multiple punctate foci of SWI signal loss in the region of the infarcts compatible with microhemorrhages. Small chronic lacunar infarct within the left lentiform nucleus. Background mild multifocal T2/FLAIR hyperintensity within the cerebral white matter is nonspecific, but compatible with chronic small vessel ischemic disease. No evidence of intracranial mass. No extra-axial fluid collection is identified. No midline shift Vascular: Expected proximal arterial flow voids. Skull and upper cervical spine: Within limitations of motion degradation, no focal marrow lesion is identified. Sinuses/Orbits: Visualized orbits show no acute finding. Bilateral  mastoid effusions. These results will be called to the ordering clinician or representative by the Radiologist Assistant, and communication documented in the PACS or [employer]. IMPRESSION: Significantly motion degraded examination, as described and limiting evaluation. Patchy acute/early subacute cortical infarcts within the right frontal, parietal and occipital lobes (MCA vascular territory, as well as MCA/PCA watershed territory and right PCA territory). Scattered superimposed microhemorrhages these sites. Small chronic left basal ganglia lacunar infarct. Background mild parenchymal atrophy and chronic small vessel ischemic disease. Bilateral mastoid effusions. Electronically Signed   By: Kellie Simmering DO   On: 11/21/2020 17:31   US PELVIS LIMITED (TRANSABDOMINAL ONLY)  Result Date: 11/21/2020 CLINICAL DATA:  Urinary retention.  Unable to place Foley catheter. EXAM: LIMITED ULTRASOUND OF PELVIS TECHNIQUE: Limited transabdominal ultrasound examination of the pelvis was performed. COMPARISON:  CT 12/15/2018 FINDINGS: Urinary bladder is moderately distended. No bladder wall thickening. Bladder volume 808 mL. Patient unable to void. Prostate measures 5.9 x 5.3 x 4.8 cm.  Prostate volume 80 mL. IMPRESSION: Moderately distended urinary bladder.  Patient unable to void. Mildly prominent prostate. Electronically Signed   By: Rolm Baptise M.D.   On: 11/21/2020 17:01   US Carotid Bilateral  Result Date: 11/22/2020 CLINICAL  DATA:  Acute cerebral infarction, hypertension, hyperlipidemia and history of coronary artery disease. EXAM: BILATERAL CAROTID DUPLEX ULTRASOUND TECHNIQUE: Pearline Cables scale imaging, color Doppler and duplex ultrasound were performed of bilateral carotid and vertebral arteries in the neck. COMPARISON:  None. FINDINGS: Criteria: Quantification of carotid stenosis is based on velocity parameters that correlate the residual internal carotid diameter with NASCET-based stenosis levels, using the diameter  of the distal internal carotid lumen as the denominator for stenosis measurement. The following velocity measurements were obtained: RIGHT ICA:  120/30 cm/sec CCA:  69/48 cm/sec SYSTOLIC ICA/CCA RATIO:  1.2 ECA:  153 cm/sec LEFT ICA:  105/30 cm/sec CCA:  54/62 cm/sec SYSTOLIC ICA/CCA RATIO:  1.1 ECA:  110 cm/sec RIGHT CAROTID ARTERY: Moderate calcified plaque at the level of the carotid bulb and proximal right ICA. Estimated right ICA stenosis is less than 50%. RIGHT VERTEBRAL ARTERY: Antegrade flow with normal waveform and velocity. LEFT CAROTID ARTERY: Moderate calcified plaque at the level of the left carotid bulb and proximal left ICA. Estimated left ICA stenosis is less than 50%. LEFT VERTEBRAL ARTERY: Antegrade flow with normal waveform and velocity. IMPRESSION: Moderate plaque at the level of both carotid bulbs and proximal internal carotid arteries. Estimated bilateral ICA stenoses are less than 50%. Electronically Signed   By: Aletta Edouard M.D.   On: 11/22/2020 14:56        Scheduled Meds: . amLODipine  10 mg Oral Daily  . aspirin  81 mg Oral Daily  . atorvastatin  20 mg Oral Daily  . carvedilol  18.75 mg Oral BID WC  . Chlorhexidine Gluconate Cloth  6 each Topical Daily  . cholecalciferol  1,000 Units Oral Daily  . citalopram  40 mg Oral Daily  . docusate sodium  100 mg Oral BID  . ferrous sulfate  325 mg Oral Q breakfast  . furosemide  20 mg Oral Daily  . gentamicin cream  1 application Topical Daily  . levETIRAcetam  500 mg Oral Q24H  . losartan  50 mg Oral Daily  . neomycin-polymyxin B  20 mL Irrigation Once  . nicotine  14 mg Transdermal Daily  . polyethylene glycol  17 g Oral BID  . predniSONE  10 mg Oral Q breakfast  . senna-docusate  1 tablet Oral BID  . tamsulosin  0.4 mg Oral Daily   Continuous Infusions: . dialysis solution 1.5% low-MG/low-CA      Assessment & Plan:   Principal Problem:   Rotator cuff arthropathy Active Problems:   CAD (coronary artery  disease)   Primary myelofibrosis (HCC)   Tobacco abuse   Depression   Seizure-like activity (HCC)   Thrombocytopenia (HCC)   Anemia in ESRD (end-stage renal disease) (HCC)   Hyperkalemia   Leukocytosis   Status post shoulder replacement   ESRD on peritoneal dialysis ([place])   Urinary retention   Rotator cuff arthropathyand status post shoulder replacement: 1 day post leftreverse total shoulder arthroplasty. Pain mx scd and asa for dvt ppx PT/OT LUE, gentle ROM F/u ortho in 2 weeks-Dr. Posey Pronto    Seizure-like activity:Dr.Khaliqdina of neuro is consulted. Likely due to focal seizure per Dr. Lorrin Goodell. -seizureprecaution 3/3-MRI brain-Patchy acute/early subacute cortical infarcts within the right frontal, parietal and occipital lobes (MCA vascular territory, as well as MCA/PCA watershed territory and right PCA territory). Scattered superimposed microhemorrhages these sites. Was given Keppra 20 mg/kg IV x1 Was started on 500 mg of Keppra twice daily Goal LDL less than 70...> Patient refused statin as his LDL is 91.  Follow-up A1c Continue telemetry Bedside swallowing eval...>please see SPL note, rec. GI evaluation...>will consider calling in am as pt c/o "food not going down, getting stuck". Stroke education booklet SBP goal gradual normotension Echo ordered MRA today -No evidence of high-grade flow-limiting stenosis, proximal branch occlusion, or intracranial aneurysm.Luminal irregularity with mild-to-moderate stenosis at the petrous cavernous segment of the right ICA. Will f/u neurology rec.    CAD (coronary artery disease):No chest pain Pt not taking statin Started on asa  ESRD on peritoneal dialysis: Nephrology following Pt on peritoneal dialysis  Urinary Retention- Patient had bladder scan of 600 cc.  Nursing could not do pain and out cath.  Unable to Place Foley with difficulty. 3/3-foley placed by urology.  flomax 0.4mg  ordered Will need outpt f/u   Per Dr. Erlene Quan, voiding trial in 48hrs. If needs to be replaced, use coude as this went in easily  ] Primary myelofibrosis (HCC):Patient used to be on Jeananne Rama was stopped due to side effects of weaknessper his wife -Follow-up with Dr. Rogue Bussing  Tobacco abuse -Nicotine patch  Depression -Continue home medications  Thrombocytopenia ([place]):This is chronic issues.  Platelet lower today, 89. If continues to drop consider hemeatology consult. Will ck cbc in am.   Anemia in ESRD (end-stage renal disease) (HCC):Hemoglobin 8.6 (9.5 before surgery) nephrology following  Hyperkalemia: -Dialysis per renal  Leukocytosis:Mild leukocytosis with WBC 11.4, no source of infection identified, likely reactive Improved    DVT prophylaxis:  Code Status:full Family Communication: wife  Status is: Inpatient  Remains inpatient appropriate because:Inpatient level of care appropriate due to severity of illness   Dispo: The patient is from: Home              Anticipated d/c is to: Home with [facility] with 24/7 supervision              Patient currently is not medically stable to d/c.   Difficult to place patient No  Needs neurology w/u and clearance.             LOS: 3 days   Time spent: 35 minutes with more than 50% on [place], MD [facility] Pager [phone]  If 7PM-7AM, please contact night-coverage 11/22/2020, 5:11 PM

## 2020-11-22 NOTE — Progress Notes (Signed)
Patient went down for ff-up MRI and ultrasound of the carotid. Off tele and without RN per MD.

## 2020-11-22 NOTE — Progress Notes (Signed)
1100: Patient started on Lipitor. Patient refused to take the medication. Wife was at bedside and said that she was taking lipitor before and it caused her to have kidney issues so patient refused it.   Teaching done regarding its importance since he had the stroke. He still would not take it. Dr. Kurtis Bushman notified.

## 2020-11-22 NOTE — Progress Notes (Signed)
[facility]  ROUNDING NOTE   Subjective:  PD progressing well. Not having any muscle twitching this AM. [facility] noted on his left shoulder.   Objective:  Vital signs in last 24 hours:  Temp:  [97.1 F (36.2 C)-97.9 F (36.6 C)] 97.4 F (36.3 C) (03/03 0800) Pulse Rate:  [79-108] 100 (03/03 0900) Resp:  [10-33] 17 (03/03 0900) BP: (96-150)/(54-80) 125/71 (03/03 0900) SpO2:  [91 %-99 %] 96 % (03/03 0900)  Weight change:  Filed Weights   11/19/20 0614  Weight: 73.9 kg    Intake/Output: I/O last 3 completed shifts: In: 0  Out: 2200 [Urine:2200]   Intake/Output this shift:  No intake/output data recorded.  Physical Exam: General:  No acute distress  Head:  Normocephalic, atraumatic. Moist oral mucosal membranes  Eyes:  Anicteric  Neck:  Supple  Lungs:   Clear to auscultation, normal effort  Heart:  S1S2 no rubs  Abdomen:   Soft, nontender, bowel sounds present  Extremities:  No peripheral edema.  Neurologic:  Awake, alert, following commands  Skin:  No lesions  Access:  PD catheter in place    Basic Metabolic Panel: Recent Labs  Lab 11/19/20 0626 11/20/20 0423  NA 137 134*  K 4.6 5.2*  CL 105 103  CO2  --  18*  GLUCOSE 100* 125*  BUN 71* 78*  CREATININE 7.30* 7.95*  CALCIUM  --  7.6*  MG  --  2.0    Liver Function Tests: Recent Labs  Lab 11/20/20 0423  AST 13*  ALT 8  ALKPHOS 45  BILITOT 0.6  PROT 5.6*  ALBUMIN 2.9*   No results for input(s): LIPASE, AMYLASE in the last 168 hours. No results for input(s): AMMONIA in the last 168 hours.  CBC: Recent Labs  Lab 11/19/20 0626 11/20/20 0423  WBC  --  11.4*  HGB 9.5* 8.6*  HCT 28.0* 25.6*  MCV  --  89.5  PLT  --  121*    Cardiac Enzymes: No results for input(s): CKTOTAL, CKMB, CKMBINDEX, TROPONINI in the last 168 hours.  BNP: Invalid input(s): POCBNP  CBG: Recent Labs  Lab 11/20/20 0303 11/20/20 0321 11/20/20 0418  GLUCAP 121* 126* 61*     Microbiology: Results for orders placed or performed during the hospital encounter of 11/15/20  Surgical pcr screen     Status: Abnormal   Collection Time: 11/15/20  8:20 AM   Specimen: Nasal Mucosa; Nasal Swab  Result Value Ref Range Status   MRSA, PCR NEGATIVE NEGATIVE Final   Staphylococcus aureus POSITIVE (A) NEGATIVE Final    Comment: RESULT CALLED TO, READ BACK BY AND VERIFIED WITH: REED RENO RN AT 1452 ON 11/15/20 SNG (NOTE) The Xpert SA Assay (FDA approved for NASAL specimens in patients 106 years of age and older), is one component of a comprehensive surveillance program. It is not intended to diagnose infection nor to guide or monitor treatment. Performed at [facility], [place], [place] [address]   SARS CORONAVIRUS 2 (TAT 6-24 HRS) Nasopharyngeal Nasopharyngeal Swab     Status: None   Collection Time: 11/15/20  9:25 AM   Specimen: Nasopharyngeal Swab  Result Value Ref Range Status   SARS Coronavirus 2 NEGATIVE NEGATIVE Final    Comment: (NOTE) SARS-CoV-2 target nucleic acids are NOT DETECTED.  The SARS-CoV-2 RNA is generally detectable in upper and lower respiratory specimens during the acute phase of infection. Negative results do not preclude SARS-CoV-2 infection, do not rule out co-infections  with other pathogens, and should not be used as the sole basis for treatment or other patient management decisions. Negative results must be combined with clinical observations, patient history, and epidemiological information. The expected result is Negative.  Fact Sheet for Patients: SugarRoll.be  Fact Sheet for Healthcare Providers: https://www.woods-mathews.com/  This test is not yet approved or cleared by the [place] FDA and  has been authorized for detection and/or diagnosis of SARS-CoV-2 by FDA under an Emergency Use Authorization (EUA). This EUA will remain  in effect (meaning this test  can be used) for the duration of the COVID-19 declaration under Se ction 564(b)(1) of the Act, 21 U.S.C. section 360bbb-3(b)(1), unless the authorization is terminated or revoked sooner.  Performed at [facility], [place] [address] [place], [place] [address]     Coagulation Studies: No results for input(s): LABPROT, INR in the last 72 hours.  Urinalysis: No results for input(s): COLORURINE, LABSPEC, PHURINE, GLUCOSEU, HGBUR, BILIRUBINUR, KETONESUR, PROTEINUR, UROBILINOGEN, NITRITE, LEUKOCYTESUR in the last 72 hours.  Invalid input(s): APPERANCEUR    Imaging: MR BRAIN WO CONTRAST  Result Date: 11/21/2020 CLINICAL DATA:  Seizure, partial. Additional history provided: Patient recently status post total shoulder arthroplasty with subsequent brief possible seizure episode, back to baseline neurologically status post possible seizure. EXAM: MRI HEAD WITHOUT CONTRAST TECHNIQUE: Multiplanar, multiecho pulse sequences of the brain and surrounding structures were obtained without intravenous contrast. COMPARISON:  Head CT 11/20/2020. FINDINGS: Brain: The examination is intermittently motion degraded limiting evaluation. Most notably, there is moderate motion degradation of the sagittal T1 weighted sequence, mild-to-moderate motion degradation of the axial T2/FLAIR sequence, moderate motion degradation of the axial T1 weighted sequence, severe motion degradation of the coronal T2 weighted sequence oriented perpendicular to the long axis of the hippocampi, severe motion degradation of the coronal T2/FLAIR sequence oriented perpendicular to the long axis of the hippocampi, moderate motion degradation of the whole brain coronal T2 weighted sequence and moderate motion degradation of the coronal T1 weighted sequence oriented perpendicular to the long axis of the hippocampi. Mild cerebral and cerebellar atrophy. There are patchy cortical foci of restricted diffusion within the right cerebral hemisphere  likely reflecting acute/early subacute infarcts. These are present within the mid to posterior right frontal lobe (including the right motor strip), as well as right parietooccipital lobes (including the right postcentral gyrus). These infarcts are present within the right MCA vascular territory as well as right MCA/PCA watershed territory and right PCA territory. Corresponding T2/FLAIR hyperintensity at these sites. Additionally, there are multiple punctate foci of SWI signal loss in the region of the infarcts compatible with microhemorrhages. Small chronic lacunar infarct within the left lentiform nucleus. Background mild multifocal T2/FLAIR hyperintensity within the cerebral white matter is nonspecific, but compatible with chronic small vessel ischemic disease. No evidence of intracranial mass. No extra-axial fluid collection is identified. No midline shift Vascular: Expected proximal arterial flow voids. Skull and upper cervical spine: Within limitations of motion degradation, no focal marrow lesion is identified. Sinuses/Orbits: Visualized orbits show no acute finding. Bilateral mastoid effusions. These results will be called to the ordering clinician or representative by the Radiologist Assistant, and communication documented in the PACS or [employer]. IMPRESSION: Significantly motion degraded examination, as described and limiting evaluation. Patchy acute/early subacute cortical infarcts within the right frontal, parietal and occipital lobes (MCA vascular territory, as well as MCA/PCA watershed territory and right PCA territory). Scattered superimposed microhemorrhages these sites. Small chronic left basal ganglia lacunar infarct. Background mild parenchymal atrophy  and chronic small vessel ischemic disease. Bilateral mastoid effusions. Electronically Signed   By: Kellie Simmering DO   On: 11/21/2020 17:31   US PELVIS LIMITED (TRANSABDOMINAL ONLY)  Result Date: 11/21/2020 CLINICAL DATA:  Urinary  retention.  Unable to place Foley catheter. EXAM: LIMITED ULTRASOUND OF PELVIS TECHNIQUE: Limited transabdominal ultrasound examination of the pelvis was performed. COMPARISON:  CT 12/15/2018 FINDINGS: Urinary bladder is moderately distended. No bladder wall thickening. Bladder volume 808 mL. Patient unable to void. Prostate measures 5.9 x 5.3 x 4.8 cm.  Prostate volume 80 mL. IMPRESSION: Moderately distended urinary bladder.  Patient unable to void. Mildly prominent prostate. Electronically Signed   By: Rolm Baptise M.D.   On: 11/21/2020 17:01   EEG adult  Result Date: 11/20/2020 Lora Havens, MD     11/20/2020  3:38 PM Patient Name: James Arnold MRN: [id number] Epilepsy Attending: Lora Havens Referring Physician/Provider: Dr Donnetta Simpers Date: 11/20/2020 Duration: 26.21 mins Patient history: 65 y.o. 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 Self resolved in about 40 secs. EEG to evaluate for seizure Level of alertness: Awake AEDs during EEG study: LEV Technical aspects: This EEG study was done with scalp electrodes positioned according to the 10-20 International system of electrode placement. Electrical activity was acquired at a sampling rate of 500Hz  and reviewed with a high frequency filter of 70Hz  and a low frequency filter of 1Hz . EEG data were recorded continuously and digitally stored. Description: The posterior dominant rhythm consists of 7.5 Hz activity of moderate voltage (25-35 uV) seen predominantly in posterior head regions, symmetric and reactive to eye opening and eye closing. Hyperventilation and photic stimulation were not performed.   IMPRESSION: This study is  within normal limits. No seizures or epileptiform discharges were seen throughout the recording. Priyanka Barbra Sarks     Medications:   . sodium chloride 50 mL/hr at 11/19/20 0728  . sodium chloride Stopped (11/19/20 2100)  . dialysis solution 1.5% low-MG/low-CA     .  stroke: mapping our early stages of recovery book   Does not apply Once  . amLODipine  10 mg Oral Daily  . aspirin  81 mg Oral Daily  . atorvastatin  20 mg Oral Daily  . carvedilol  18.75 mg Oral BID WC  . Chlorhexidine Gluconate Cloth  6 each Topical Daily  . cholecalciferol  1,000 Units Oral Daily  . citalopram  40 mg Oral Daily  . docusate sodium  100 mg Oral BID  . ferrous sulfate  325 mg Oral Q breakfast  . furosemide  20 mg Oral Daily  . gentamicin cream  1 application Topical Daily  . levETIRAcetam  500 mg Oral Q24H  . losartan  50 mg Oral Daily  . neomycin-polymyxin B  20 mL Irrigation Once  . nicotine  14 mg Transdermal Daily  . polyethylene glycol  17 g Oral BID  . predniSONE  10 mg Oral Q breakfast  . senna-docusate  1 tablet Oral BID  . tamsulosin  0.4 mg Oral Daily   acetaminophen, alum & mag hydroxide-simeth, bisacodyl, calcium carbonate, diphenhydrAMINE, heparin, hydrALAZINE, HYDROmorphone (DILAUDID) injection, LORazepam, melatonin, menthol-cetylpyridinium **OR** phenol, methocarbamol, ondansetron (ZOFRAN)  IV, oxyCODONE, oxyCODONE, senna-docusate  Assessment/ Plan:  65 y.o. male the past medical history of ESRD on PD, myelodysplastic syndrome, hypertension, anemia of chronic kidney disease, secondary hyperparathyroidism who underwent left reverse total shoulder arthroplasty and developed muscle twitching and possible seizure-like activity postoperatively.   1.  ESRD on PD.  Overall doing better.  Continue peritoneal dialysis as prescribed.  2.  Anemia of chronic kidney disease.  Hgb 8.6 at the moment, check CBC today.   3.  Secondary hyperparathyroidism.  Not on phos binders at the moment.   4.   Hyperkalemia.  Recheck serum K today.  5.  Muscle twitching/possible seizure activity/MCA territory CVA:  Pt maintained on keppra, CVA likely cause of seizure like activity.    LOS: 3 Sulma Ruffino 3/3/202210:26 AM

## 2020-11-22 NOTE — Progress Notes (Signed)
*  PRELIMINARY RESULTS* Echocardiogram 2D Echocardiogram has been performed.  Sherrie Sport 11/22/2020, 10:24 AM

## 2020-11-23 ENCOUNTER — Inpatient Hospital Stay: Payer: Medicare HMO

## 2020-11-23 DIAGNOSIS — I639 Cerebral infarction, unspecified: Secondary | ICD-10-CM

## 2020-11-23 DIAGNOSIS — F32A Depression, unspecified: Secondary | ICD-10-CM | POA: Diagnosis not present

## 2020-11-23 DIAGNOSIS — I63511 Cerebral infarction due to unspecified occlusion or stenosis of right middle cerebral artery: Secondary | ICD-10-CM | POA: Diagnosis not present

## 2020-11-23 DIAGNOSIS — Z992 Dependence on renal dialysis: Secondary | ICD-10-CM | POA: Diagnosis not present

## 2020-11-23 DIAGNOSIS — D471 Chronic myeloproliferative disease: Secondary | ICD-10-CM

## 2020-11-23 DIAGNOSIS — M12812 Other specific arthropathies, not elsewhere classified, left shoulder: Secondary | ICD-10-CM | POA: Diagnosis not present

## 2020-11-23 DIAGNOSIS — N186 End stage renal disease: Secondary | ICD-10-CM | POA: Diagnosis not present

## 2020-11-23 DIAGNOSIS — R339 Retention of urine, unspecified: Secondary | ICD-10-CM | POA: Diagnosis not present

## 2020-11-23 DIAGNOSIS — D638 Anemia in other chronic diseases classified elsewhere: Secondary | ICD-10-CM

## 2020-11-23 DIAGNOSIS — I25118 Atherosclerotic heart disease of native coronary artery with other forms of angina pectoris: Secondary | ICD-10-CM

## 2020-11-23 DIAGNOSIS — R569 Unspecified convulsions: Secondary | ICD-10-CM | POA: Diagnosis not present

## 2020-11-23 DIAGNOSIS — K222 Esophageal obstruction: Secondary | ICD-10-CM

## 2020-11-23 LAB — CBC
HCT: 22.8 % — ABNORMAL LOW (ref 39.0–52.0)
Hemoglobin: 7.7 g/dL — ABNORMAL LOW (ref 13.0–17.0)
MCH: 30.6 pg (ref 26.0–34.0)
MCHC: 33.8 g/dL (ref 30.0–36.0)
MCV: 90.5 fL (ref 80.0–100.0)
Platelets: 88 10*3/uL — ABNORMAL LOW (ref 150–400)
RBC: 2.52 MIL/uL — ABNORMAL LOW (ref 4.22–5.81)
RDW: 16 % — ABNORMAL HIGH (ref 11.5–15.5)
WBC: 4.4 10*3/uL (ref 4.0–10.5)
nRBC: 0 % (ref 0.0–0.2)

## 2020-11-23 MED ORDER — CLOPIDOGREL BISULFATE 75 MG PO TABS
75.0000 mg | ORAL_TABLET | Freq: Every day | ORAL | Status: DC
  Administered 2020-11-23 – 2020-11-24 (×2): 75 mg via ORAL
  Filled 2020-11-23 (×2): qty 1

## 2020-11-23 MED ORDER — ROSUVASTATIN CALCIUM 10 MG PO TABS
20.0000 mg | ORAL_TABLET | Freq: Every evening | ORAL | Status: DC
  Administered 2020-11-23: 20 mg via ORAL
  Filled 2020-11-23: qty 2

## 2020-11-23 MED ORDER — OXYCODONE HCL 5 MG PO TABS: 5.0000 mg | ORAL_TABLET | ORAL | 0 refills | Status: DC | PRN

## 2020-11-23 NOTE — Progress Notes (Signed)
**Note James-Identified via Obfuscation** [name], male   DOB: 1955-12-20, 65 y.o.   MRN: [id number] [facility] PROGRESS NOTE  [name] Arnold:[id number] DOB: 12-Mar-1956 DOA: 11/19/2020 PCP: Park Liter P, DO  HPI/Subjective: Patient feeling okay.  He is not able to move his left hand very much.  Had recent surgery on his left shoulder and also had a stroke and seizure.  He sometimes complains of food getting stuck but only solid food.  Objective: Vitals:   11/23/20 1232 11/23/20 1500  BP: (!) 141/72 102/62  Pulse: 87 83  Resp: 17 17  Temp: 97.6 F (36.4 C) 97.9 F (36.6 C)  SpO2: 97% 93%    Intake/Output Summary (Last 24 hours) at 11/23/2020 1541 Last data filed at 11/23/2020 1359 Gross per 24 hour  Intake 360 ml  Output 700 ml  Net -340 ml   Filed Weights   11/19/20 0614 11/23/20 0400  Weight: 73.9 kg 75.1 kg    ROS: Review of Systems  Respiratory: Negative for shortness of breath.   Cardiovascular: Negative for chest pain.  Gastrointestinal: Negative for abdominal pain, nausea and vomiting.  Musculoskeletal: Positive for joint pain.   Exam: Physical Exam HENT:     Head: Normocephalic.     Mouth/Throat:     Pharynx: No oropharyngeal exudate.  Eyes:     General: Lids are normal.     Conjunctiva/sclera: Conjunctivae normal.     Pupils: Pupils are equal, round, and reactive to light.  Cardiovascular:     Rate and Rhythm: Normal rate and regular rhythm.     Heart sounds: Normal heart sounds, S1 normal and S2 normal.  Pulmonary:     Breath sounds: Normal breath sounds. No decreased breath sounds, wheezing, rhonchi or rales.  Abdominal:     Palpations: Abdomen is soft.     Tenderness: There is no abdominal tenderness.  Musculoskeletal:     Right lower leg: No swelling.     Left lower leg: No swelling.  Skin:    General: Skin is warm.     Findings: No rash.  Neurological:     Mental Status: He is alert and oriented to person, place, and time.     Comments: Patient unable to  extend his fingers on his left hand unable to squeeze with his left hand.  Sensation intact on left hand and left arm.  Patient able to straight leg raise bilaterally       Data Reviewed: Basic Metabolic Panel: Recent Labs  Lab 11/19/20 0626 11/20/20 0423 11/22/20 1223  NA 137 134* 132*  K 4.6 5.2* 4.6  CL 105 103 100  CO2  --  18* 22  GLUCOSE 100* 125* 96  BUN 71* 78* 68*  CREATININE 7.30* 7.95* 7.57*  CALCIUM  --  7.6* 7.6*  MG  --  2.0  --   PHOS  --   --  7.8*   Liver Function Tests: Recent Labs  Lab 11/20/20 0423 11/22/20 1223  AST 13*  --   ALT 8  --   ALKPHOS 45  --   BILITOT 0.6  --   PROT 5.6*  --   ALBUMIN 2.9* 2.6*   CBC: Recent Labs  Lab 11/19/20 0626 11/20/20 0423 11/22/20 1223 11/23/20 0452  WBC  --  11.4* 4.2 4.4  HGB 9.5* 8.6* 7.2* 7.7*  HCT 28.0* 25.6* 21.9* 22.8*  MCV  --  89.5 91.3 90.5  PLT  --  121* 89* 88*    CBG:  Recent Labs  Lab 11/20/20 0303 11/20/20 0321 11/20/20 0418  GLUCAP 121* 126* 124*    Recent Results (from the past 240 hour(s))  Surgical pcr screen     Status: Abnormal   Collection Time: 11/15/20  8:20 AM   Specimen: Nasal Mucosa; Nasal Swab  Result Value Ref Range Status   MRSA, PCR NEGATIVE NEGATIVE Final   Staphylococcus aureus POSITIVE (A) NEGATIVE Final    Comment: RESULT CALLED TO, READ BACK BY AND VERIFIED WITH: REED RENO RN AT 1452 ON 11/15/20 SNG (NOTE) The Xpert SA Assay (FDA approved for NASAL specimens in patients 2 years of age and older), is one component of a comprehensive surveillance program. It is not intended to diagnose infection nor to guide or monitor treatment. Performed at [facility], [place], [place] [address]   SARS CORONAVIRUS 2 (TAT 6-24 HRS) Nasopharyngeal Nasopharyngeal Swab     Status: None   Collection Time: 11/15/20  9:25 AM   Specimen: Nasopharyngeal Swab  Result Value Ref Range Status   SARS Coronavirus 2 NEGATIVE NEGATIVE Final    Comment:  (NOTE) SARS-CoV-2 target nucleic acids are NOT DETECTED.  The SARS-CoV-2 RNA is generally detectable in upper and lower respiratory specimens during the acute phase of infection. Negative results do not preclude SARS-CoV-2 infection, do not rule out co-infections with other pathogens, and should not be used as the sole basis for treatment or other patient management decisions. Negative results must be combined with clinical observations, patient history, and epidemiological information. The expected result is Negative.  Fact Sheet for Patients: SugarRoll.be  Fact Sheet for Healthcare Providers: https://www.woods-mathews.com/  This test is not yet approved or cleared by the [place] FDA and  has been authorized for detection and/or diagnosis of SARS-CoV-2 by FDA under an Emergency Use Authorization (EUA). This EUA will remain  in effect (meaning this test can be used) for the duration of the COVID-19 declaration under Se ction 564(b)(1) of the Act, 21 U.S.C. section 360bbb-3(b)(1), unless the authorization is terminated or revoked sooner.  Performed at [facility], [place] [address], [place], [place] [address]      Studies: MR ANGIO HEAD WO CONTRAST  Result Date: 11/22/2020 CLINICAL DATA:  Neuro deficit, acute, stroke suspected. EXAM: MRA HEAD WITHOUT CONTRAST TECHNIQUE: Angiographic images of the Circle of Willis were obtained using MRA technique without intravenous contrast. COMPARISON:  MRI of the brain November 21, 2020. FINDINGS: Luminal irregularity with mild-to-moderate stenosis at the petrous cavernous segment of the right ICA. Mild luminal irregularity of the cavernous left ICA without hemodynamically significant stenosis. The bilateral anterior cerebral arteries and middle cerebral arteries are widely patent with antegrade flow without high-grade flow-limiting stenosis or proximal branch occlusion. No intracranial aneurysm within  the anterior circulation. The vertebral arteries are widely patent with antegrade flow. Vertebrobasilar junction and basilar artery are widely patent with antegrade flow without evidence of basilar stenosis or aneurysm. Posterior cerebral arteries are normal bilaterally. No intracranial aneurysm within the posterior circulation. IMPRESSION: 1. No evidence of high-grade flow-limiting stenosis, proximal branch occlusion, or intracranial aneurysm. 2. Luminal irregularity with mild-to-moderate stenosis at the petrous cavernous segment of the right ICA. Electronically Signed   By: Pedro Earls M.D.   On: 11/22/2020 15:25   US PELVIS LIMITED (TRANSABDOMINAL ONLY)  Result Date: 11/21/2020 CLINICAL DATA:  Urinary retention.  Unable to place Foley catheter. EXAM: LIMITED ULTRASOUND OF PELVIS TECHNIQUE: Limited transabdominal ultrasound examination of the pelvis was performed.  COMPARISON:  CT 12/15/2018 FINDINGS: Urinary bladder is moderately distended. No bladder wall thickening. Bladder volume 808 mL. Patient unable to void. Prostate measures 5.9 x 5.3 x 4.8 cm.  Prostate volume 80 mL. IMPRESSION: Moderately distended urinary bladder.  Patient unable to void. Mildly prominent prostate. Electronically Signed   By: Rolm Baptise M.D.   On: 11/21/2020 17:01   US Carotid Bilateral  Result Date: 11/22/2020 CLINICAL DATA:  Acute cerebral infarction, hypertension, hyperlipidemia and history of coronary artery disease. EXAM: BILATERAL CAROTID DUPLEX ULTRASOUND TECHNIQUE: Pearline Cables scale imaging, color Doppler and duplex ultrasound were performed of bilateral carotid and vertebral arteries in the neck. COMPARISON:  None. FINDINGS: Criteria: Quantification of carotid stenosis is based on velocity parameters that correlate the residual internal carotid diameter with NASCET-based stenosis levels, using the diameter of the distal internal carotid lumen as the denominator for stenosis measurement. The following velocity  measurements were obtained: RIGHT ICA:  120/30 cm/sec CCA:  65/46 cm/sec SYSTOLIC ICA/CCA RATIO:  1.2 ECA:  153 cm/sec LEFT ICA:  105/30 cm/sec CCA:  50/35 cm/sec SYSTOLIC ICA/CCA RATIO:  1.1 ECA:  110 cm/sec RIGHT CAROTID ARTERY: Moderate calcified plaque at the level of the carotid bulb and proximal right ICA. Estimated right ICA stenosis is less than 50%. RIGHT VERTEBRAL ARTERY: Antegrade flow with normal waveform and velocity. LEFT CAROTID ARTERY: Moderate calcified plaque at the level of the left carotid bulb and proximal left ICA. Estimated left ICA stenosis is less than 50%. LEFT VERTEBRAL ARTERY: Antegrade flow with normal waveform and velocity. IMPRESSION: Moderate plaque at the level of both carotid bulbs and proximal internal carotid arteries. Estimated bilateral ICA stenoses are less than 50%. Electronically Signed   By: Aletta Edouard M.D.   On: 11/22/2020 14:56   ECHOCARDIOGRAM COMPLETE BUBBLE STUDY  Result Date: 11/22/2020    ECHOCARDIOGRAM REPORT   Patient Name:   KAPONO LUHN Date of Exam: 11/22/2020 Medical Rec #:  [id number]     Height:       66.3 in Accession #:    [id number]    Weight:       163.0 lb Date of Birth:  Aug 07, 1956      BSA:          1.840 m Patient Age:    33 years      BP:           125/71 mmHg Patient Gender: M             HR:           100 bpm. Exam Location:  [facility] Procedure: 2D Echo, Cardiac Doppler, Color Doppler and Saline Contrast Bubble            Study Indications:     Stroke 434.91/I63.9  History:         Patient has prior history of Echocardiogram examinations, most                  recent 09/07/2020. Previous Myocardial Infarction, COPD; Risk                  Factors:Hypertension.  Sonographer:     Sherrie Sport RDCS (AE) Referring Phys:  [id number] [facility] Diagnosing Phys: Ida Rogue MD  Sonographer Comments: Technically challenging study due to limited acoustic windows and suboptimal apical window. IMPRESSIONS  1. Left ventricular ejection fraction, by  estimation, is 50 to 55%. The left ventricle has low normal function. The left ventricle has no regional wall motion abnormalities.  Left ventricular diastolic parameters are consistent with Grade I diastolic dysfunction (impaired relaxation).  2. Right ventricular systolic function is normal. The right ventricular size is normal.  3. Agitated saline contrast bubble study was negative, with no evidence of any interatrial shunt. FINDINGS  Left Ventricle: Left ventricular ejection fraction, by estimation, is 50 to 55%. The left ventricle has low normal function. The left ventricle has no regional wall motion abnormalities. The left ventricular internal cavity size was normal in size. There is no left ventricular hypertrophy. Left ventricular diastolic parameters are consistent with Grade I diastolic dysfunction (impaired relaxation). Right Ventricle: The right ventricular size is normal. No increase in right ventricular wall thickness. Right ventricular systolic function is normal. Left Atrium: Left atrial size was normal in size. Right Atrium: Right atrial size was normal in size. Pericardium: There is no evidence of pericardial effusion. Mitral Valve: The mitral valve is normal in structure. Mild mitral annular calcification. No evidence of mitral valve regurgitation. No evidence of mitral valve stenosis. Tricuspid Valve: The tricuspid valve is normal in structure. Tricuspid valve regurgitation is not demonstrated. No evidence of tricuspid stenosis. Aortic Valve: The aortic valve is normal in structure. Aortic valve regurgitation is not visualized. No aortic stenosis is present. Aortic valve mean gradient measures 3.0 mmHg. Aortic valve peak gradient measures 6.0 mmHg. Aortic valve area, by VTI measures 3.51 cm. Pulmonic Valve: The pulmonic valve was normal in structure. Pulmonic valve regurgitation is not visualized. No evidence of pulmonic stenosis. Aorta: The aortic root is normal in size and structure. Venous:  The inferior vena cava is normal in size with greater than 50% respiratory variability, suggesting right atrial pressure of 3 mmHg. IAS/Shunts: No atrial level shunt detected by color flow Doppler. Agitated saline contrast was given intravenously to evaluate for intracardiac shunting. Agitated saline contrast bubble study was negative, with no evidence of any interatrial shunt.  LEFT VENTRICLE PLAX 2D LVIDd:         4.95 cm  Diastology LVIDs:         3.47 cm  LV e' medial:    5.22 cm/s LV PW:         1.04 cm  LV E/e' medial:  14.6 LV IVS:        0.90 cm  LV e' lateral:   4.13 cm/s LVOT diam:     2.00 cm  LV E/e' lateral: 18.5 LV SV:         69 LV SV Index:   38 LVOT Area:     3.14 cm  RIGHT VENTRICLE RV S prime:     12.50 cm/s TAPSE (M-mode): 3.2 cm LEFT ATRIUM             Index       RIGHT ATRIUM           Index LA diam:        4.40 cm 2.39 cm/m  RA Area:     20.00 cm LA Vol (A2C):   41.8 ml 22.71 ml/m RA Volume:   61.60 ml  33.47 ml/m LA Vol (A4C):   69.5 ml 37.76 ml/m LA Biplane Vol: 57.1 ml 31.03 ml/m  AORTIC VALVE                   PULMONIC VALVE AV Area (Vmax):    2.60 cm    PV Vmax:        0.83 m/s AV Area (Vmean):   2.64 cm    PV Peak  grad:   2.7 mmHg AV Area (VTI):     3.51 cm    RVOT Peak grad: 5 mmHg AV Vmax:           122.00 cm/s AV Vmean:          78.600 cm/s AV VTI:            0.197 m AV Peak Grad:      6.0 mmHg AV Mean Grad:      3.0 mmHg LVOT Vmax:         101.00 cm/s LVOT Vmean:        66.100 cm/s LVOT VTI:          0.220 m LVOT/AV VTI ratio: 1.12  AORTA Ao Root diam: 3.00 cm MITRAL VALVE                TRICUSPID VALVE MV Area (PHT): 2.90 cm     TR Peak grad:   7.4 mmHg MV Decel Time: 262 msec     TR Vmax:        136.00 cm/s MV E velocity: 76.30 cm/s MV A velocity: 120.00 cm/s  SHUNTS MV E/A ratio:  0.64         Systemic VTI:  0.22 m                             Systemic Diam: 2.00 cm Ida Rogue MD Electronically signed by Ida Rogue MD Signature Date/Time: 11/22/2020/7:47:47 PM     Final    DG ESOPHAGUS W SINGLE CM (SOL OR THIN BA)  Result Date: 11/23/2020 CLINICAL DATA:  Trouble swallowing.  Food getting stuck in throat. EXAM: ESOPHOGRAM/BARIUM SWALLOW TECHNIQUE: Single contrast examination was performed using  thin barium. FLUOROSCOPY TIME:  Fluoroscopy Time:  1 minutes and 18 seconds. Radiation Exposure Index (if provided by the fluoroscopic device): 26.5 mGy Number of Acquired Spot Images: COMPARISON:  None. FINDINGS: Single contrast evaluation of the esophagus with the patient in a 45 degree head up position shows no gross mass lesion, diverticulum, or mucosal ulceration. There is a tiny hiatal hernia. Subtle narrowing is seen in the distal esophagus just proximal to the esophagogastric junction. There is some wall irregularity in the same region of the distal esophagus. 13 mm barium tablet becomes lodged in the distal esophagus despite repeated swallows of thin barium and water. IMPRESSION: 1. Limited study due to recent shoulder surgery and patient immobility. 2. Subtle distal esophageal narrowing with mucosal irregularity. 3. 13 mm barium tablet becomes lodged in the distal esophagus just proximal to the esophagogastric junction. Given associated mucosal irregularity in this region, upper endoscopy recommended to further evaluate. 4. Tiny hiatal hernia. Electronically Signed   By: Misty Stanley M.D.   On: 11/23/2020 12:48    Scheduled Meds: . amLODipine  10 mg Oral Daily  . aspirin  81 mg Oral Daily  . atorvastatin  20 mg Oral Daily  . carvedilol  18.75 mg Oral BID WC  . Chlorhexidine Gluconate Cloth  6 each Topical Daily  . cholecalciferol  1,000 Units Oral Daily  . citalopram  40 mg Oral Daily  . docusate sodium  100 mg Oral BID  . ferrous sulfate  325 mg Oral Q breakfast  . furosemide  20 mg Oral Daily  . gentamicin cream  1 application Topical Daily  . levETIRAcetam  500 mg Oral Q24H  . losartan  50 mg Oral Daily  . neomycin-polymyxin B  20 mL Irrigation Once   . nicotine  14 mg Transdermal Daily  . polyethylene glycol  17 g Oral BID  . predniSONE  10 mg Oral Q breakfast  . senna-docusate  1 tablet Oral BID  . tamsulosin  0.4 mg Oral Daily   Continuous Infusions: . dialysis solution 1.5% low-MG/low-CA      Assessment/Plan:  1. 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  Case discussed with neurology.  Patient on aspirin and they recommended Plavix for 21 days.  Patient deferred cholesterol medication at this time.  Left hand weakness could be from the strokes.  Neurology did recommend a TEE.  Dr. Rockey Situ will set up for a Linq recorder TEE as outpatient. 2. 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 Acute urinary retention voiding trial today.  Patient urinated and has 250 mL postvoid residual.  Continue to monitor today. 4. Rotator cuff arthropathy status post shoulder replacement 5. End-stage renal disease on peritoneal dialysis 6. Myelofibrosis.  Follow-up with Dr. Rogue Bussing as outpatient.  Patient is anemic here.  Check hemoglobin tomorrow morning.  Patient also has thrombocytopenia. 7. Essential hypertension on Norvasc 8. Depression on Celexa 9. Dysphagia.  Barium swallow showed distal esophagus tightening.  Neurology did not want a sedative procedure for 2 weeks at least.  This will have to be done as outpatient.  Advised to have soft diet.        Code Status:     Code Status Orders  (From admission, onward)         Start     Ordered   11/19/20 1302  Full code  Continuous        11/19/20 1301        Code Status History    Date Active Date Inactive Code Status Order ID Comments User Context   12/29/2017 0223 12/30/2017 2003 Full Code [id number]  Lance Coon, MD Inpatient   11/25/2017 1532 11/26/2017 1607 Full Code [id number]  Anthonette Legato, MD Inpatient   Advance Care Planning Activity    Advance Directive Documentation   Flowsheet Row Most Recent Value  Type of Advance Directive  Healthcare Power of Attorney, Living will  Pre-existing out of facility DNR order (yellow form or pink MOST form) --  "MOST" Form in Place? --     Family Communication: Wife at the bedside in the morning and on the phone in the afternoon Disposition Plan: Status is: Inpatient  Dispo: The patient is from: Home              Anticipated d/c is to: Potentially home tomorrow              Patient currently going through voiding trial today.  Continue to watch urination overnight into tomorrow.   Difficult to place patient.  No.  Consultants:  Neurology  Orthopedic surgery  Nephrology  Neurology  Time spent: 37 minutes, case discussed with neurology cardiology gastroenterology neurology and nephrology.  Case also discussed with nursing staff  Loletha Grayer  [facility]

## 2020-11-23 NOTE — Progress Notes (Signed)
[facility]  ROUNDING NOTE   Subjective:    Patient seen while sitting in the recliner Alert and oriented Able to tolerate meals without nausea Denies shortness of breath Feels he and his wife are able to maintain his PD treatments  UOP 700 ml  Objective:  Vital signs in last 24 hours:  Temp:  [97.5 F (36.4 C)-98.2 F (36.8 C)] 97.6 F (36.4 C) (03/04 1232) Pulse Rate:  [78-98] 87 (03/04 1232) Resp:  [12-27] 17 (03/04 1232) BP: (100-141)/(59-88) 141/72 (03/04 1232) SpO2:  [90 %-97 %] 97 % (03/04 1232) Weight:  [75.1 kg] 75.1 kg (03/04 0400)  Weight change:  Filed Weights   11/19/20 0614 11/23/20 0400  Weight: 73.9 kg 75.1 kg    Intake/Output: I/O last 3 completed shifts: In: 120 [P.O.:120] Out: 1200 [Urine:1200]   Intake/Output this shift:  Total I/O In: 360 [P.O.:360] Out: -   Physical Exam: General:  No acute distress  Head:  Normocephalic, atraumatic. Moist oral mucosal membranes  Eyes:  Anicteric  Neck:  Supple  Lungs:   Clear to auscultation, normal effort  Heart:  S1S2 present  Abdomen:   Soft, nontender, bowel sounds present  Extremities:  No peripheral edema.  Neurologic:  Awake, alert  Skin:  No lesions  Access:  PD catheter in place    Basic Metabolic Panel: Recent Labs  Lab 11/19/20 0626 11/20/20 0423 11/22/20 1223  NA 137 134* 132*  K 4.6 5.2* 4.6  CL 105 103 100  CO2  --  18* 22  GLUCOSE 100* 125* 96  BUN 71* 78* 68*  CREATININE 7.30* 7.95* 7.57*  CALCIUM  --  7.6* 7.6*  MG  --  2.0  --   PHOS  --   --  7.8*    Liver Function Tests: Recent Labs  Lab 11/20/20 0423 11/22/20 1223  AST 13*  --   ALT 8  --   ALKPHOS 45  --   BILITOT 0.6  --   PROT 5.6*  --   ALBUMIN 2.9* 2.6*   No results for input(s): LIPASE, AMYLASE in the last 168 hours. No results for input(s): AMMONIA in the last 168 hours.  CBC: Recent Labs  Lab 11/19/20 0626 11/20/20 0423 11/22/20 1223 11/23/20 0452  WBC  --  11.4* 4.2 4.4   HGB 9.5* 8.6* 7.2* 7.7*  HCT 28.0* 25.6* 21.9* 22.8*  MCV  --  89.5 91.3 90.5  PLT  --  121* 89* 88*    Cardiac Enzymes: No results for input(s): CKTOTAL, CKMB, CKMBINDEX, TROPONINI in the last 168 hours.  BNP: Invalid input(s): POCBNP  CBG: Recent Labs  Lab 11/20/20 0303 11/20/20 0321 11/20/20 0418  GLUCAP 121* 126* 3*    Microbiology: Results for orders placed or performed during the hospital encounter of 11/15/20  Surgical pcr screen     Status: Abnormal   Collection Time: 11/15/20  8:20 AM   Specimen: Nasal Mucosa; Nasal Swab  Result Value Ref Range Status   MRSA, PCR NEGATIVE NEGATIVE Final   Staphylococcus aureus POSITIVE (A) NEGATIVE Final    Comment: RESULT CALLED TO, READ BACK BY AND VERIFIED WITH: REED RENO RN AT 1452 ON 11/15/20 SNG (NOTE) The Xpert SA Assay (FDA approved for NASAL specimens in patients 32 years of age and older), is one component of a comprehensive surveillance program. It is not intended to diagnose infection nor to guide or monitor treatment. Performed at [facility], [address], [place], [place] [address]  SARS CORONAVIRUS 2 (TAT 6-24 HRS) Nasopharyngeal Nasopharyngeal Swab     Status: None   Collection Time: 11/15/20  9:25 AM   Specimen: Nasopharyngeal Swab  Result Value Ref Range Status   SARS Coronavirus 2 NEGATIVE NEGATIVE Final    Comment: (NOTE) SARS-CoV-2 target nucleic acids are NOT DETECTED.  The SARS-CoV-2 RNA is generally detectable in upper and lower respiratory specimens during the acute phase of infection. Negative results do not preclude SARS-CoV-2 infection, do not rule out co-infections with other pathogens, and should not be used as the sole basis for treatment or other patient management decisions. Negative results must be combined with clinical observations, patient history, and epidemiological information. The expected result is Negative.  Fact Sheet for  Patients: SugarRoll.be  Fact Sheet for Healthcare Providers: https://www.woods-mathews.com/  This test is not yet approved or cleared by the [place] FDA and  has been authorized for detection and/or diagnosis of SARS-CoV-2 by FDA under an Emergency Use Authorization (EUA). This EUA will remain  in effect (meaning this test can be used) for the duration of the COVID-19 declaration under Se ction 564(b)(1) of the Act, 21 U.S.C. section 360bbb-3(b)(1), unless the authorization is terminated or revoked sooner.  Performed at [facility], [place] [address], [place], [place] [address]     Coagulation Studies: No results for input(s): LABPROT, INR in the last 72 hours.  Urinalysis: No results for input(s): COLORURINE, LABSPEC, PHURINE, GLUCOSEU, HGBUR, BILIRUBINUR, KETONESUR, PROTEINUR, UROBILINOGEN, NITRITE, LEUKOCYTESUR in the last 72 hours.  Invalid input(s): APPERANCEUR    Imaging: MR ANGIO HEAD WO CONTRAST  Result Date: 11/22/2020 CLINICAL DATA:  Neuro deficit, acute, stroke suspected. EXAM: MRA HEAD WITHOUT CONTRAST TECHNIQUE: Angiographic images of the Circle of Willis were obtained using MRA technique without intravenous contrast. COMPARISON:  MRI of the brain November 21, 2020. FINDINGS: Luminal irregularity with mild-to-moderate stenosis at the petrous cavernous segment of the right ICA. Mild luminal irregularity of the cavernous left ICA without hemodynamically significant stenosis. The bilateral anterior cerebral arteries and middle cerebral arteries are widely patent with antegrade flow without high-grade flow-limiting stenosis or proximal branch occlusion. No intracranial aneurysm within the anterior circulation. The vertebral arteries are widely patent with antegrade flow. Vertebrobasilar junction and basilar artery are widely patent with antegrade flow without evidence of basilar stenosis or aneurysm. Posterior cerebral arteries are  normal bilaterally. No intracranial aneurysm within the posterior circulation. IMPRESSION: 1. No evidence of high-grade flow-limiting stenosis, proximal branch occlusion, or intracranial aneurysm. 2. Luminal irregularity with mild-to-moderate stenosis at the petrous cavernous segment of the right ICA. Electronically Signed   By: Pedro Earls M.D.   On: 11/22/2020 15:25   US PELVIS LIMITED (TRANSABDOMINAL ONLY)  Result Date: 11/21/2020 CLINICAL DATA:  Urinary retention.  Unable to place Foley catheter. EXAM: LIMITED ULTRASOUND OF PELVIS TECHNIQUE: Limited transabdominal ultrasound examination of the pelvis was performed. COMPARISON:  CT 12/15/2018 FINDINGS: Urinary bladder is moderately distended. No bladder wall thickening. Bladder volume 808 mL. Patient unable to void. Prostate measures 5.9 x 5.3 x 4.8 cm.  Prostate volume 80 mL. IMPRESSION: Moderately distended urinary bladder.  Patient unable to void. Mildly prominent prostate. Electronically Signed   By: Rolm Baptise M.D.   On: 11/21/2020 17:01   US Carotid Bilateral  Result Date: 11/22/2020 CLINICAL DATA:  Acute cerebral infarction, hypertension, hyperlipidemia and history of coronary artery disease. EXAM: BILATERAL CAROTID DUPLEX ULTRASOUND TECHNIQUE: Pearline Cables scale imaging, color Doppler and duplex ultrasound were performed of bilateral carotid  and vertebral arteries in the neck. COMPARISON:  None. FINDINGS: Criteria: Quantification of carotid stenosis is based on velocity parameters that correlate the residual internal carotid diameter with NASCET-based stenosis levels, using the diameter of the distal internal carotid lumen as the denominator for stenosis measurement. The following velocity measurements were obtained: RIGHT ICA:  120/30 cm/sec CCA:  13/08 cm/sec SYSTOLIC ICA/CCA RATIO:  1.2 ECA:  153 cm/sec LEFT ICA:  105/30 cm/sec CCA:  65/78 cm/sec SYSTOLIC ICA/CCA RATIO:  1.1 ECA:  110 cm/sec RIGHT CAROTID ARTERY: Moderate calcified  plaque at the level of the carotid bulb and proximal right ICA. Estimated right ICA stenosis is less than 50%. RIGHT VERTEBRAL ARTERY: Antegrade flow with normal waveform and velocity. LEFT CAROTID ARTERY: Moderate calcified plaque at the level of the left carotid bulb and proximal left ICA. Estimated left ICA stenosis is less than 50%. LEFT VERTEBRAL ARTERY: Antegrade flow with normal waveform and velocity. IMPRESSION: Moderate plaque at the level of both carotid bulbs and proximal internal carotid arteries. Estimated bilateral ICA stenoses are less than 50%. Electronically Signed   By: Aletta Edouard M.D.   On: 11/22/2020 14:56   ECHOCARDIOGRAM COMPLETE BUBBLE STUDY  Result Date: 11/22/2020    ECHOCARDIOGRAM REPORT   Patient Name:   MOO GRAVLEY Date of Exam: 11/22/2020 Medical Rec #:  [id number]     Height:       66.3 in Accession #:    [id number]    Weight:       163.0 lb Date of Birth:  May 14, 1956      BSA:          1.840 m Patient Age:    38 years      BP:           125/71 mmHg Patient Gender: M             HR:           100 bpm. Exam Location:  [facility] Procedure: 2D Echo, Cardiac Doppler, Color Doppler and Saline Contrast Bubble            Study Indications:     Stroke 434.91/I63.9  History:         Patient has prior history of Echocardiogram examinations, most                  recent 09/07/2020. Previous Myocardial Infarction, COPD; Risk                  Factors:Hypertension.  Sonographer:     Sherrie Sport RDCS (AE) Referring Phys:  [id number] [facility] Diagnosing Phys: Ida Rogue MD  Sonographer Comments: Technically challenging study due to limited acoustic windows and suboptimal apical window. IMPRESSIONS  1. Left ventricular ejection fraction, by estimation, is 50 to 55%. The left ventricle has low normal function. The left ventricle has no regional wall motion abnormalities. Left ventricular diastolic parameters are consistent with Grade I diastolic dysfunction (impaired relaxation).  2. Right  ventricular systolic function is normal. The right ventricular size is normal.  3. Agitated saline contrast bubble study was negative, with no evidence of any interatrial shunt. FINDINGS  Left Ventricle: Left ventricular ejection fraction, by estimation, is 50 to 55%. The left ventricle has low normal function. The left ventricle has no regional wall motion abnormalities. The left ventricular internal cavity size was normal in size. There is no left ventricular hypertrophy. Left ventricular diastolic parameters are consistent with Grade I diastolic dysfunction (impaired relaxation). Right Ventricle:  The right ventricular size is normal. No increase in right ventricular wall thickness. Right ventricular systolic function is normal. Left Atrium: Left atrial size was normal in size. Right Atrium: Right atrial size was normal in size. Pericardium: There is no evidence of pericardial effusion. Mitral Valve: The mitral valve is normal in structure. Mild mitral annular calcification. No evidence of mitral valve regurgitation. No evidence of mitral valve stenosis. Tricuspid Valve: The tricuspid valve is normal in structure. Tricuspid valve regurgitation is not demonstrated. No evidence of tricuspid stenosis. Aortic Valve: The aortic valve is normal in structure. Aortic valve regurgitation is not visualized. No aortic stenosis is present. Aortic valve mean gradient measures 3.0 mmHg. Aortic valve peak gradient measures 6.0 mmHg. Aortic valve area, by VTI measures 3.51 cm. Pulmonic Valve: The pulmonic valve was normal in structure. Pulmonic valve regurgitation is not visualized. No evidence of pulmonic stenosis. Aorta: The aortic root is normal in size and structure. Venous: The inferior vena cava is normal in size with greater than 50% respiratory variability, suggesting right atrial pressure of 3 mmHg. IAS/Shunts: No atrial level shunt detected by color flow Doppler. Agitated saline contrast was given intravenously to  evaluate for intracardiac shunting. Agitated saline contrast bubble study was negative, with no evidence of any interatrial shunt.  LEFT VENTRICLE PLAX 2D LVIDd:         4.95 cm  Diastology LVIDs:         3.47 cm  LV e' medial:    5.22 cm/s LV PW:         1.04 cm  LV E/e' medial:  14.6 LV IVS:        0.90 cm  LV e' lateral:   4.13 cm/s LVOT diam:     2.00 cm  LV E/e' lateral: 18.5 LV SV:         69 LV SV Index:   38 LVOT Area:     3.14 cm  RIGHT VENTRICLE RV S prime:     12.50 cm/s TAPSE (M-mode): 3.2 cm LEFT ATRIUM             Index       RIGHT ATRIUM           Index LA diam:        4.40 cm 2.39 cm/m  RA Area:     20.00 cm LA Vol (A2C):   41.8 ml 22.71 ml/m RA Volume:   61.60 ml  33.47 ml/m LA Vol (A4C):   69.5 ml 37.76 ml/m LA Biplane Vol: 57.1 ml 31.03 ml/m  AORTIC VALVE                   PULMONIC VALVE AV Area (Vmax):    2.60 cm    PV Vmax:        0.83 m/s AV Area (Vmean):   2.64 cm    PV Peak grad:   2.7 mmHg AV Area (VTI):     3.51 cm    RVOT Peak grad: 5 mmHg AV Vmax:           122.00 cm/s AV Vmean:          78.600 cm/s AV VTI:            0.197 m AV Peak Grad:      6.0 mmHg AV Mean Grad:      3.0 mmHg LVOT Vmax:         101.00 cm/s LVOT Vmean:  66.100 cm/s LVOT VTI:          0.220 m LVOT/AV VTI ratio: 1.12  AORTA Ao Root diam: 3.00 cm MITRAL VALVE                TRICUSPID VALVE MV Area (PHT): 2.90 cm     TR Peak grad:   7.4 mmHg MV Decel Time: 262 msec     TR Vmax:        136.00 cm/s MV E velocity: 76.30 cm/s MV A velocity: 120.00 cm/s  SHUNTS MV E/A ratio:  0.64         Systemic VTI:  0.22 m                             Systemic Diam: 2.00 cm Ida Rogue MD Electronically signed by Ida Rogue MD Signature Date/Time: 11/22/2020/7:47:47 PM    Final    DG ESOPHAGUS W SINGLE CM (SOL OR THIN BA)  Result Date: 11/23/2020 CLINICAL DATA:  Trouble swallowing.  Food getting stuck in throat. EXAM: ESOPHOGRAM/BARIUM SWALLOW TECHNIQUE: Single contrast examination was performed using  thin barium.  FLUOROSCOPY TIME:  Fluoroscopy Time:  1 minutes and 18 seconds. Radiation Exposure Index (if provided by the fluoroscopic device): 26.5 mGy Number of Acquired Spot Images: COMPARISON:  None. FINDINGS: Single contrast evaluation of the esophagus with the patient in a 45 degree head up position shows no gross mass lesion, diverticulum, or mucosal ulceration. There is a tiny hiatal hernia. Subtle narrowing is seen in the distal esophagus just proximal to the esophagogastric junction. There is some wall irregularity in the same region of the distal esophagus. 13 mm barium tablet becomes lodged in the distal esophagus despite repeated swallows of thin barium and water. IMPRESSION: 1. Limited study due to recent shoulder surgery and patient immobility. 2. Subtle distal esophageal narrowing with mucosal irregularity. 3. 13 mm barium tablet becomes lodged in the distal esophagus just proximal to the esophagogastric junction. Given associated mucosal irregularity in this region, upper endoscopy recommended to further evaluate. 4. Tiny hiatal hernia. Electronically Signed   By: Misty Stanley M.D.   On: 11/23/2020 12:48     Medications:   . dialysis solution 1.5% low-MG/low-CA     . amLODipine  10 mg Oral Daily  . aspirin  81 mg Oral Daily  . atorvastatin  20 mg Oral Daily  . carvedilol  18.75 mg Oral BID WC  . Chlorhexidine Gluconate Cloth  6 each Topical Daily  . cholecalciferol  1,000 Units Oral Daily  . citalopram  40 mg Oral Daily  . docusate sodium  100 mg Oral BID  . ferrous sulfate  325 mg Oral Q breakfast  . furosemide  20 mg Oral Daily  . gentamicin cream  1 application Topical Daily  . levETIRAcetam  500 mg Oral Q24H  . losartan  50 mg Oral Daily  . neomycin-polymyxin B  20 mL Irrigation Once  . nicotine  14 mg Transdermal Daily  . polyethylene glycol  17 g Oral BID  . predniSONE  10 mg Oral Q breakfast  . senna-docusate  1 tablet Oral BID  . tamsulosin  0.4 mg Oral Daily   acetaminophen,  alum & mag hydroxide-simeth, bisacodyl, calcium carbonate, diphenhydrAMINE, heparin, hydrALAZINE, HYDROmorphone (DILAUDID) injection, LORazepam, melatonin, menthol-cetylpyridinium **OR** phenol, methocarbamol, ondansetron (ZOFRAN) IV, oxyCODONE, oxyCODONE, senna-docusate  Assessment/ Plan:  65 y.o. male the past medical history of ESRD on PD, myelodysplastic syndrome, hypertension,  anemia of chronic kidney disease, secondary hyperparathyroidism who underwent left reverse total shoulder arthroplasty and developed muscle twitching and possible seizure-like activity postoperatively.  CCPD fill 2L, Cycles 4/Dwell 8 hours/Fill 10 min/ Drain 20 min/no last fill  1.  ESRD on PD.   Continues to receive PD nightly during admission Patient will guide spouse through PD process this weekend. They will return to PD clinic early next week to provide spouse with supportive training. Arranged with clinic    2.  Anemia of chronic kidney disease.   Hgb 7.7 at the moment  Monitor CBC daily   3.  Secondary hyperparathyroidism.  Phos binders not ordered at this time  4.  Hyperkalemia.   Current level 4.6  5.  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  Stable on Keppra No additional seizure activity   LOS: 4 Yony Roulston 3/4/20222:36 PM

## 2020-11-23 NOTE — Progress Notes (Addendum)
NEUROLOGY CONSULTATION PROGRESS NOTE   Date of service: November 23, 2020 Patient Name: James Arnold MRN:  [id number] DOB:  18-Nov-1955  Brief HPI   James Arnold is a 65 y.o. 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 I witnessed one of the events and noted following. Abrupt L foot rhythmic jerking > Entire left lower ext jerking in a few secs > jerking of his left fingers. Self resolved in about 40 secs. No loss of awareness during the event and engages in a conversation during the event. Neuro exam immediately afterwards, demonstrated mild LLE weakness that improved. The event was clinically concerning for a focal seizure with retained awareness. rEEG was negative. MRI Brain demonstrated patchy acute/early subacute cortical infarcts within the right frontal, parietal and occipital lobes (MCA vascular territory, as well as MCA/PCA watershed territory and right PCA territory). Scattered superimposed microhemorrhages these sites. On Keppra 500mg  BID.  Interval Hx   No significant change. Reported that solid food gets stuck in his chest when he is eating but that has been an ongoing issue.  Vitals   Vitals:   11/23/20 0300 11/23/20 0400 11/23/20 0500 11/23/20 0725  BP: 117/69  130/64 105/83  Pulse: 84  88 98  Resp: 14  20 17   Temp:   98 F (36.7 C) (!) 97.5 F (36.4 C)  TempSrc:   Oral Oral  SpO2: 92%  94% 95%  Weight:  75.1 kg    Height:         Body mass index is 26.46 kg/m.  Physical Exam   Neurologic Examination  Mental status/Cognition: Alert, oriented to self, place, month and year, good attention.  Speech/language: Fluent, comprehension intact, object naming intact, repetition intact.  Cranial nerves:   CN II Pupils equal and reactive to light, no VF deficits    CN III,IV,VI EOM intact,  no gaze preference or deviation, no nystagmus    CN V    CN VII no asymmetry, no nasolabial fold flattening    CN VIII normal hearing to speech    CN IX & X    CN XI    CN XII    Motor:  Muscle bulk: poor, tone normal Mvmt Root Nerve  Muscle Right Left Comments  SA C5/6 Ax Deltoid     EF C5/6 Mc Biceps 5    EE C6/7/8 Rad Triceps 5    WF C6/7 Med FCR     WE C7/8 PIN ECU     F Ab C8/T1 U ADM/FDI 5 1 LUE in splint  HF L1/2/3 Fem Illopsoas 5 5   KE L2/3/4 Fem Quad     DF L4/5 D Peron Tib Ant 5 5   PF S1/2 Tibial Grc/Sol 5 5    Sensation:  Light touch Intact throughout   Pin prick    Temperature    Vibration   Proprioception    Coordination/Complex Motor:  - Finger to Nose intact in RUE  Labs   Basic Metabolic Panel:  Lab Results  Component Value Date   NA 132 (L) 11/22/2020   K 4.6 11/22/2020   CO2 22 11/22/2020   GLUCOSE 96 11/22/2020   BUN 68 (H) 11/22/2020   CREATININE 7.57 (H) 11/22/2020   CALCIUM 7.6 (L) 11/22/2020   GFRNONAA 7 (L) 11/22/2020   GFRAA  14 (L) 07/10/2020   HbA1c:  Lab Results  Component Value Date   HGBA1C 4.9 11/21/2020   LDL:  Lab Results  Component Value Date   LDLCALC 91 11/21/2020   Urine Drug Screen: No results found for: LABOPIA, COCAINSCRNUR, LABBENZ, AMPHETMU, THCU, LABBARB  Alcohol Level No results found for: ETH No results found for: PHENYTOIN, ZONISAMIDE, LAMOTRIGINE, LEVETIRACETA No results found for: PHENYTOIN, PHENOBARB, VALPROATE, CBMZ  Imaging and Diagnostic studies  MR BRAIN WO CONTRAST  IMPRESSION: Significantly motion degraded examination, as described and limiting evaluation.  Patchy acute/early subacute cortical infarcts within the right frontal, parietal and occipital lobes (MCA vascular territory, as well as MCA/PCA watershed territory and right PCA territory). Scattered superimposed microhemorrhages these sites.  MRA Brain wo contrast: IMPRESSION: 1. No evidence of high-grade flow-limiting stenosis,  proximal branch occlusion, or intracranial aneurysm. 2. Luminal irregularity with mild-to-moderate stenosis at the petrous cavernous segment of the right ICA.   Carotid Duplex BL: Moderate plaque at the level of both carotid bulbs and proximal internal carotid arteries. Estimated bilateral ICA stenoses are less than 50%.  TTE: LVEF: 50-55%, no PFO on agitated saline study  Lab Results  Component Value Date   HGBA1C 4.9 11/21/2020   and  Lab Results  Component Value Date   [facility] 91 11/21/2020    Impression   James Arnold is a 65 y.o. 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 Workup with rEEG unremarkable. MRI Brain with R MCA and MCA/PCA watershed territory infarcts with some scattered superimposed microhemorrhages. MRA and Carotid duplex with no significant LVO. LDL elevated to 91. HbA1c of 4.9.  Workup for stroke so far is non revealing. Recommend TEE and LINQ device placement.  Recommendations  Plan:  - Frequent Neuro checks per stroke unit protocol - Vascular imaging with MRA Angio Head without contrast and US Carotid doppler with no LVO or significant stenosis. - TTE with EF of 50-55%, no PFO on agitated saline study - LDL of 91, Atorvastatin was ordered but patient refused Atorvastatin. - HbA1c of 4.9 - Antithrombotic - Aspirin 81mg  daily along with plavix 75mg  daily x 21 days, then aspirin 81mg  daily alone. - DVT ppx - SBP goal - gradual normotension. - Continue telemetry monitoring. - Recommend bedside swallow screen prior to PO intake. - Stroke education booklet - Recommend PT/OT/SLP consult - Counseled on the importance of quitting smoking. - Recommend TEE and LINQ placement. Cardiology planning to do this outpatient. - Primary team asked if in the setting of stroke, okay for endoscopy  under anesthesia. Would recommend waiting 2 weeks atleast after stroke before planning on GA. - Follow up with Stroke clinic with Dr. Antony Contras with [facility] in 2 months. ____________________________________________________________________   Thank you for the opportunity to take part in the care of this patient. If you have any further questions, please contact the neurology consultation attending.  Signed,  [place] Pager Number [phone]

## 2020-11-23 NOTE — Progress Notes (Signed)
Patient voided in toilet in addition to moving his bowels. Patient bladder scanned post voiding occurrence and had 254 ml remaining in his bladder.

## 2020-11-23 NOTE — Consult Note (Signed)
Cardiology Consultation:   Patient ID: ACIE CUSTIS MRN: [id number]; DOB: 19-Oct-1955  Admit date: 11/19/2020 Date of Consult: 11/23/2020  PCP:  Valerie Roys, [place]  Cardiologist:  Nelva Bush, MD  Physician requesting consult: Dr. Leslye Peer Reason for consult strokes, need for TEE and loop monitor   Patient Profile:   James Arnold is a 65 y.o. 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 Saunders Revel  Cardiac history notable for CAD s/p bare-metal stenting of mid LAD in 1997 at [facility].     hospitalized in 2019 with chest pain underwent echocardiogram which showed normal LV function and grade 1 diastolic dysfunction.    Stress test was low risk/nonischemic.     emergency department at [facility] 07/26/2020   consistent with COPD exacerbation.    does home peritoneal dialysis every day but Saturday.  Seen in clinic 08/15/20 noting dyspnea on exertion and chest "just pounding". He was started on Imdur 30mg  daily.   Lexiscan Myoview 08/21/20 was low risk with no evidence of ischemia. Echo 09/07/20 LVEF 60-65%, gr1DD, RV normal size and pressure, mild to moderate MR.  He presented to the hospital with a left shoulder arthroplasty Underwent procedure fibber 28 2022  November 20, 2020, developed seizure-like activity vital stable at the time, reported having muscle twitching, vomiting, eyes rolled back, Tonic-clonic movements were noted more on the right side involving whole right side of body head given Ativan 2 mg with improvement in symptoms  Follow-up notes from hospitalist detail 5-6 episodes seizure-like activity left foot shaking twitching involving  entire left leg left fingers  Started on Keppra, EEG ordered, MRI brain 1 episode witnessed by neurology  EEG within normal limits  MRI  demonstrates Patchy acute/early subacute cortical infarcts within the right frontal, parietal and occipital lobes (MCA vascular territory, as well as MCA/PCA watershed territory and right PCA territory). Scattered superimposed microhemorrhages these sites.  Carotid ultrasoundModerate plaque at the level of both carotid bulbs and proximal internal carotid arteries. Estimated bilateral ICA stenoses are less than 50%.  Neurology has recommended TEE and Linq placement  Past Medical History:  Diagnosis Date  . Anemia   . Benign hypertensive renal disease   . CAD (coronary artery disease)    a. 08/1996 s/p BMS to the mLAD ([facility]); b. 12/2017 MV: EF 46%, fixed apical defect w/ significant GI uptake artifact. No ischemia-> low risk.  . CKD (chronic kidney disease), stage V ([place])    on peritoneal dialysis  . Complication of anesthesia    please call by "[name]" when waking up!!  . COPD (chronic obstructive pulmonary disease) ([place])    centrilobular emphysema. pt is poorly controlled with his copd/smoking.  . Depression   . Diastolic dysfunction    a. 12/2017 Echo: EF 60-65%, no rwma, Gr1 DD, mild AI/MR. Nl RVSP.  [name] Kitchen Dyspnea    with exertion  . Fatigue   . FSGS (focal segmental glomerulosclerosis)   . Hyperlipidemia 10/2002  . Hypertension   . myelofibrosis    bone marrow failure  . Myelofibrosis ([place]) 2010   JAK2 (+) primary  . Myocardial infarction ([place]) 1997   stent x 1  . Smoker   .  Thrombocytopenia ([place])     Past Surgical History:  Procedure Laterality Date  . ANGIOPLASTY    . BONE MARROW ASPIRATION  03/2009  . CARDIAC CATHETERIZATION  1997   stent x 1  . COLONOSCOPY WITH PROPOFOL N/A 08/09/2018   Procedure: COLONOSCOPY WITH PROPOFOL;  Surgeon: [name], MD;  Location: [facility] ENDOSCOPY;  Service: Gastroenterology;  Laterality: N/A;  .  CORONARY STENT PLACEMENT  08/1996  . EXTERIORIZATION OF A CONTINUOUS AMBULATORY PERITONEAL DIALYSIS CATHETER    . EYE SURGERY Bilateral    cats removed  . REVERSE SHOULDER ARTHROPLASTY Left 11/19/2020   Procedure: Left reverse shoulder arthroplasty;  Surgeon: Leim Fabry, MD;  Location: [facility] ORS;  Service: Orthopedics;  Laterality: Left;     Home Medications:  Prior to Admission medications   Medication Sig Start Date End Date Taking? Authorizing Provider  amLODipine (NORVASC) 10 MG tablet Take 1 tablet (10 mg total) by mouth daily. 07/10/20  Yes Johnson, Megan P, DO  carvedilol (COREG) 12.5 MG tablet Take 1.5 tablets (18.75 mg total) by mouth 2 (two) times daily. 09/10/20 09/05/21 Yes Loel Dubonnet, NP  Cholecalciferol (VITAMIN D-1000 MAX ST) 25 MCG (1000 UT) tablet Take 1,000 Units by mouth daily.    Yes [provider]  citalopram (CELEXA) 40 MG tablet TAKE 1 TABLET EVERY DAY Patient taking differently: Take 40 mg by mouth daily. 06/20/20  Yes Johnson, Megan P, DO  epoetin alfa-epbx (RETACRIT) 10258 UNIT/ML injection Inject into the skin. Once a month at dialysis   Yes [provider]  ferrous sulfate 325 (65 FE) MG tablet Take 1 tablet (325 mg total) by mouth daily with breakfast. 12/30/17 04/25/21 Yes Pyreddy, Reatha Harps, MD  furosemide (LASIX) 20 MG tablet Take 1 tablet (20 mg total) by mouth daily. 07/10/20  Yes Johnson, Megan P, DO  gentamicin cream (GARAMYCIN) 0.1 % Apply 1 application topically every other day. 03/18/19  Yes [provider]  losartan (COZAAR) 50 MG tablet Take 1 tablet (50 mg total) by mouth daily. 07/10/20  Yes Johnson, Megan P, DO  nicotine (NICODERM CQ) 14 mg/24hr patch Place 1 patch (14 mg total) onto the skin daily. 11/07/20  Yes Johnson, Megan P, DO  predniSONE (DELTASONE) 10 MG tablet Take 10 mg by mouth daily with breakfast.   Yes [provider]  ruxolitinib phosphate (JAKAFI) 15 MG tablet Take 1 tablet (15 mg total) by mouth  daily. Take AFTER dialysis. 11/05/20  Yes Cammie Sickle, MD  traMADol (ULTRAM) 50 MG tablet Take 50 mg by mouth every 12 (twelve) hours as needed for moderate pain. 08/23/20  Yes [provider]  varenicline (CHANTIX CONTINUING MONTH PAK) 1 MG tablet Take 1 tablet (1 mg total) by mouth 2 (two) times daily. 11/07/20  Yes Johnson, Megan P, DO  varenicline (CHANTIX STARTING MONTH PAK) 0.5 MG X 11 & 1 MG X 42 tablet Take one 0.5 mg tablet by mouth once daily for 3 days, then increase to one 0.5 mg tablet twice daily for 4 days, then increase to one 1 mg tablet twice daily. 11/07/20  Yes Johnson, Megan P, DO  ondansetron (ZOFRAN ODT) 8 MG disintegrating tablet Take 1 tablet (8 mg total) by mouth every 8 (eight) hours as needed for nausea or vomiting. 10/19/20   Verlon Au, NP  oxyCODONE (OXY IR/ROXICODONE) 5 MG immediate release tablet Take 1-2 tablets (5-10 mg total) by mouth every 4 (four) hours as needed for moderate pain (pain score 4-6). 11/23/20   Prescott Parma,  Sherren Mocha, PA-C    Inpatient Medications: Scheduled Meds: . amLODipine  10 mg Oral Daily  . aspirin  81 mg Oral Daily  . carvedilol  18.75 mg Oral BID WC  . Chlorhexidine Gluconate Cloth  6 each Topical Daily  . cholecalciferol  1,000 Units Oral Daily  . citalopram  40 mg Oral Daily  . clopidogrel  75 mg Oral Daily  . docusate sodium  100 mg Oral BID  . ferrous sulfate  325 mg Oral Q breakfast  . furosemide  20 mg Oral Daily  . gentamicin cream  1 application Topical Daily  . levETIRAcetam  500 mg Oral Q24H  . losartan  50 mg Oral Daily  . neomycin-polymyxin B  20 mL Irrigation Once  . nicotine  14 mg Transdermal Daily  . polyethylene glycol  17 g Oral BID  . predniSONE  10 mg Oral Q breakfast  . senna-docusate  1 tablet Oral BID  . tamsulosin  0.4 mg Oral Daily   Continuous Infusions: . dialysis solution 1.5% low-MG/low-CA     PRN Meds: acetaminophen, alum & mag hydroxide-simeth, bisacodyl, calcium carbonate,  diphenhydrAMINE, heparin, hydrALAZINE, HYDROmorphone (DILAUDID) injection, LORazepam, melatonin, menthol-cetylpyridinium **OR** phenol, methocarbamol, ondansetron (ZOFRAN) IV, oxyCODONE, oxyCODONE, senna-docusate  Allergies:   No Known Allergies  Social History:   Social History   Socioeconomic History  . Marital status: Married    Spouse name: Jackelyn Poling  . Number of children: Not on file  . Years of education: Not on file  . Highest education level: Not on file  Occupational History  . 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 Smoking status: Current Every Day Smoker    Packs/day: 0.75    Years: 47.00    Pack years: 35.25    Types: Cigarettes  . Smokeless tobacco: Never Used  . Tobacco comment: trying to cut back  Vaping Use  . Vaping Use: Never used  Substance and Sexual Activity  . Alcohol use: Not Currently    Comment: rare  . Drug use: No  . Sexual activity: Yes  Other Topics Concern  . Not on file  Social History Narrative   Patient lives with wife in his home.   Not working dt disability.    Has been placed on kidney transplant evaluation list, but does not qualify d/t smoking and myelofibrosis   Social Determinants of Health   Financial Resource Strain: Not on file  Food Insecurity: Not on file  Transportation Needs: Not on file  Physical Activity: Not on file  Stress: Not on file  Social Connections: Not on file  Intimate Partner Violence: Not on file    Family History:    Family History  Problem Relation Age of Onset  . Stroke Mother        Low BP stroke  . Depression Father   . Depression Sister        Breast  . Heart disease Other   . Breast cancer Other   . Lung cancer Other   . Ovarian cancer Other   . Stomach cancer Other      ROS:  Please see the history of present illness.  Review of Systems  Constitutional: Negative.   HENT: Negative.   Respiratory: Negative.   Cardiovascular: Negative.    Gastrointestinal: Negative.   Musculoskeletal: Negative.        Left arm with pain, immobilized  Neurological: Negative.   Psychiatric/Behavioral: Negative.   All other systems  reviewed and are negative.   Physical Exam/Data:   Vitals:   11/23/20 0500 11/23/20 0725 11/23/20 1232 11/23/20 1500  BP: 130/64 105/83 (!) 141/72 102/62  Pulse: 88 98 87 83  Resp: 20 17 17 17   Temp: 98 F (36.7 C) (!) 97.5 F (36.4 C) 97.6 F (36.4 C) 97.9 F (36.6 C)  TempSrc: Oral Oral Oral Oral  SpO2: 94% 95% 97% 93%  Weight:      Height:        Intake/Output Summary (Last 24 hours) at 11/23/2020 1858 Last data filed at 11/23/2020 1700 Gross per 24 hour  Intake 720 ml  Output -  Net 720 ml   Last 3 Weights 11/23/2020 11/19/2020 11/16/2020  Weight (lbs) 165 lb 9.6 oz 163 lb 163 lb  Weight (kg) 75.116 kg 73.936 kg 73.936 kg     Body mass index is 26.46 kg/m.  General:  Well nourished, well developed, in no acute distress HEENT: normal Lymph: no adenopathy Neck: no JVD Endocrine:  No thryomegaly Vascular: No carotid bruits; FA pulses 2+ bilaterally without bruits  Cardiac:  normal S1, S2; RRR; no murmur  Lungs:  clear to auscultation bilaterally, no wheezing, rhonchi or rales  Abd: soft, nontender, no hepatomegaly  Ext: no edema Musculoskeletal:  No deformities, full exam not performed on left arm, otherwise normal range of motion Skin: warm and dry  Neuro:  CNs 2-12 intact, no focal abnormalities noted Psych:  Normal affect   EKG:  The EKG was personally reviewed and demonstrates:   Normal sinus rhythm with rate 70 bpm no significant ST or T wave changes  Telemetry:  Telemetry was personally reviewed and demonstrates:   Normal sinus rhythm  Relevant CV Studies: Echocardiogram Ejection fraction 50 to 55%, bubble study negative   Laboratory Data:  High Sensitivity Troponin:  No results for input(s): TROPONINIHS in the last 720 hours.   Chemistry Recent Labs  Lab 11/19/20 0626  11/20/20 0423 11/22/20 1223  NA 137 134* 132*  K 4.6 5.2* 4.6  CL 105 103 100  CO2  --  18* 22  GLUCOSE 100* 125* 96  BUN 71* 78* 68*  CREATININE 7.30* 7.95* 7.57*  CALCIUM  --  7.6* 7.6*  GFRNONAA  --  7* 7*  ANIONGAP  --  13 10    Recent Labs  Lab 11/20/20 0423 11/22/20 1223  PROT 5.6*  --   ALBUMIN 2.9* 2.6*  AST 13*  --   ALT 8  --   ALKPHOS 45  --   BILITOT 0.6  --    Hematology Recent Labs  Lab 11/20/20 0423 11/22/20 1223 11/23/20 0452  WBC 11.4* 4.2 4.4  RBC 2.86* 2.40* 2.52*  HGB 8.6* 7.2* 7.7*  HCT 25.6* 21.9* 22.8*  MCV 89.5 91.3 90.5  MCH 30.1 30.0 30.6  MCHC 33.6 32.9 33.8  RDW 15.7* 16.1* 16.0*  PLT 121* 89* 88*   BNPNo results for input(s): BNP, PROBNP in the last 168 hours.  DDimer No results for input(s): DDIMER in the last 168 hours.   Radiology/Studies:  CT HEAD WO CONTRAST  Result Date: 11/20/2020 CLINICAL DATA:  Seizure EXAM: CT HEAD WITHOUT CONTRAST TECHNIQUE: Contiguous axial images were obtained from the base of the skull through the vertex without intravenous contrast. COMPARISON:  12/21/2009 FINDINGS: Brain: Small focal areas of encephalomalacia have developed within the high right frontal lobe and right parietal lobe in keeping with remote cortical infarcts, new since prior examination. No evidence of acute intracranial  hemorrhage or infarct. No abnormal mass effect or midline shift. No abnormal intra or extra-axial mass lesion or fluid collection. Ventricular size is normal. Cerebellum is unremarkable. Vascular: Moderate atherosclerotic calcification within the carotid siphons. No asymmetric hyperdense vasculature at the skull base. Skull: Intact Sinuses/Orbits: The orbits are unremarkable. The visualized paranasal sinuses are clear. Other: Mastoid air cells and middle ear cavities are clear. IMPRESSION: No acute intracranial hemorrhage or infarct. Small remote high right frontal and parietal cortical infarcts are new since remote prior  examination. Electronically Signed   By: Fidela Salisbury MD   On: 11/20/2020 04:46   MR ANGIO HEAD WO CONTRAST  Result Date: 11/22/2020 CLINICAL DATA:  Neuro deficit, acute, stroke suspected. EXAM: MRA HEAD WITHOUT CONTRAST TECHNIQUE: Angiographic images of the Circle of Willis were obtained using MRA technique without intravenous contrast. COMPARISON:  MRI of the brain November 21, 2020. FINDINGS: Luminal irregularity with mild-to-moderate stenosis at the petrous cavernous segment of the right ICA. Mild luminal irregularity of the cavernous left ICA without hemodynamically significant stenosis. The bilateral anterior cerebral arteries and middle cerebral arteries are widely patent with antegrade flow without high-grade flow-limiting stenosis or proximal branch occlusion. No intracranial aneurysm within the anterior circulation. The vertebral arteries are widely patent with antegrade flow. Vertebrobasilar junction and basilar artery are widely patent with antegrade flow without evidence of basilar stenosis or aneurysm. Posterior cerebral arteries are normal bilaterally. No intracranial aneurysm within the posterior circulation. IMPRESSION: 1. No evidence of high-grade flow-limiting stenosis, proximal branch occlusion, or intracranial aneurysm. 2. Luminal irregularity with mild-to-moderate stenosis at the petrous cavernous segment of the right ICA. Electronically Signed   By: Pedro Earls M.D.   On: 11/22/2020 15:25   MR BRAIN WO CONTRAST  Result Date: 11/21/2020 CLINICAL DATA:  Seizure, partial. Additional history provided: Patient recently status post total shoulder arthroplasty with subsequent brief possible seizure episode, back to baseline neurologically status post possible seizure. EXAM: MRI HEAD WITHOUT CONTRAST TECHNIQUE: Multiplanar, multiecho pulse sequences of the brain and surrounding structures were obtained without intravenous contrast. COMPARISON:  Head CT 11/20/2020. FINDINGS:  Brain: The examination is intermittently motion degraded limiting evaluation. Most notably, there is moderate motion degradation of the sagittal T1 weighted sequence, mild-to-moderate motion degradation of the axial T2/FLAIR sequence, moderate motion degradation of the axial T1 weighted sequence, severe motion degradation of the coronal T2 weighted sequence oriented perpendicular to the long axis of the hippocampi, severe motion degradation of the coronal T2/FLAIR sequence oriented perpendicular to the long axis of the hippocampi, moderate motion degradation of the whole brain coronal T2 weighted sequence and moderate motion degradation of the coronal T1 weighted sequence oriented perpendicular to the long axis of the hippocampi. Mild cerebral and cerebellar atrophy. There are patchy cortical foci of restricted diffusion within the right cerebral hemisphere likely reflecting acute/early subacute infarcts. These are present within the mid to posterior right frontal lobe (including the right motor strip), as well as right parietooccipital lobes (including the right postcentral gyrus). These infarcts are present within the right MCA vascular territory as well as right MCA/PCA watershed territory and right PCA territory. Corresponding T2/FLAIR hyperintensity at these sites. Additionally, there are multiple punctate foci of SWI signal loss in the region of the infarcts compatible with microhemorrhages. Small chronic lacunar infarct within the left lentiform nucleus. Background mild multifocal T2/FLAIR hyperintensity within the cerebral white matter is nonspecific, but compatible with chronic small vessel ischemic disease. No evidence of intracranial mass. No extra-axial fluid collection is  identified. No midline shift Vascular: Expected proximal arterial flow voids. Skull and upper cervical spine: Within limitations of motion degradation, no focal marrow lesion is identified. Sinuses/Orbits: Visualized orbits show no  acute finding. Bilateral mastoid effusions. These results will be called to the ordering clinician or representative by the Radiologist Assistant, and communication documented in the PACS or [employer]. IMPRESSION: Significantly motion degraded examination, as described and limiting evaluation. Patchy acute/early subacute cortical infarcts within the right frontal, parietal and occipital lobes (MCA vascular territory, as well as MCA/PCA watershed territory and right PCA territory). Scattered superimposed microhemorrhages these sites. Small chronic left basal ganglia lacunar infarct. Background mild parenchymal atrophy and chronic small vessel ischemic disease. Bilateral mastoid effusions. Electronically Signed   By: Kellie Simmering DO   On: 11/21/2020 17:31   US PELVIS LIMITED (TRANSABDOMINAL ONLY)  Result Date: 11/21/2020 CLINICAL DATA:  Urinary retention.  Unable to place Foley catheter. EXAM: LIMITED ULTRASOUND OF PELVIS TECHNIQUE: Limited transabdominal ultrasound examination of the pelvis was performed. COMPARISON:  CT 12/15/2018 FINDINGS: Urinary bladder is moderately distended. No bladder wall thickening. Bladder volume 808 mL. Patient unable to void. Prostate measures 5.9 x 5.3 x 4.8 cm.  Prostate volume 80 mL. IMPRESSION: Moderately distended urinary bladder.  Patient unable to void. Mildly prominent prostate. Electronically Signed   By: Rolm Baptise M.D.   On: 11/21/2020 17:01   US Carotid Bilateral  Result Date: 11/22/2020 CLINICAL DATA:  Acute cerebral infarction, hypertension, hyperlipidemia and history of coronary artery disease. EXAM: BILATERAL CAROTID DUPLEX ULTRASOUND TECHNIQUE: Pearline Cables scale imaging, color Doppler and duplex ultrasound were performed of bilateral carotid and vertebral arteries in the neck. COMPARISON:  None. FINDINGS: Criteria: Quantification of carotid stenosis is based on velocity parameters that correlate the residual internal carotid diameter with NASCET-based stenosis  levels, using the diameter of the distal internal carotid lumen as the denominator for stenosis measurement. The following velocity measurements were obtained: RIGHT ICA:  120/30 cm/sec CCA:  63/78 cm/sec SYSTOLIC ICA/CCA RATIO:  1.2 ECA:  153 cm/sec LEFT ICA:  105/30 cm/sec CCA:  58/85 cm/sec SYSTOLIC ICA/CCA RATIO:  1.1 ECA:  110 cm/sec RIGHT CAROTID ARTERY: Moderate calcified plaque at the level of the carotid bulb and proximal right ICA. Estimated right ICA stenosis is less than 50%. RIGHT VERTEBRAL ARTERY: Antegrade flow with normal waveform and velocity. LEFT CAROTID ARTERY: Moderate calcified plaque at the level of the left carotid bulb and proximal left ICA. Estimated left ICA stenosis is less than 50%. LEFT VERTEBRAL ARTERY: Antegrade flow with normal waveform and velocity. IMPRESSION: Moderate plaque at the level of both carotid bulbs and proximal internal carotid arteries. Estimated bilateral ICA stenoses are less than 50%. Electronically Signed   By: Aletta Edouard M.D.   On: 11/22/2020 14:56   DG Shoulder Left Port  Result Date: 11/20/2020 CLINICAL DATA:  LEFT shoulder replacement post seizure EXAM: LEFT SHOULDER COMPARISON:  Single portable AP view at 0742 hours compared to 11/19/2020 FINDINGS: Components of a reverse LEFT shoulder prosthesis are identified. Small humeral fracture fragment is identified adjacent to the humeral component of the prosthesis, not definitely seen on prior exam though this could potentially have been obscured by the prosthesis. No evidence of dislocation on single AP view. Hardware appears intact. Interval removal of surgical drain. IMPRESSION: No gross evidence of dislocation on single AP view. Small humeral fracture fragment is seen medially adjacent to the humeral component, not definitely seen on the prior study of 11/19/2020. These results will be called to  the ordering clinician or representative by the Radiologist Assistant, and communication documented in the  PACS or [employer]. Electronically Signed   By: Lavonia Dana M.D.   On: 11/20/2020 08:55   EEG adult  Result Date: 11/20/2020 Lora Havens, MD     11/20/2020  3:38 PM Patient Name: James Arnold MRN: [id number] Epilepsy Attending: Lora Havens Referring Physician/Provider: Dr Donnetta Simpers Date: 11/20/2020 Duration: 26.21 mins Patient history: 65 y.o. 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 Self resolved in about 40 secs. EEG to evaluate for seizure Level of alertness: Awake AEDs during EEG study: LEV Technical aspects: This EEG study was done with scalp electrodes positioned according to the 10-20 International system of electrode placement. Electrical activity was acquired at a sampling rate of 500Hz  and reviewed with a high frequency filter of 70Hz  and a low frequency filter of 1Hz . EEG data were recorded continuously and digitally stored. Description: The posterior dominant rhythm consists of 7.5 Hz activity of moderate voltage (25-35 uV) seen predominantly in posterior head regions, symmetric and reactive to eye opening and eye closing. Hyperventilation and photic stimulation were not performed.   IMPRESSION: This study is within normal limits. No seizures or epileptiform discharges were seen throughout the recording. Lora Havens   ECHOCARDIOGRAM COMPLETE BUBBLE STUDY  Result Date: 11/22/2020    ECHOCARDIOGRAM REPORT   Patient Name:   James Arnold Date of Exam: 11/22/2020 Medical Rec #:  [id number]     Height:       66.3 in Accession #:    [id number]    Weight:       163.0 lb Date of Birth:  [birth year]      BSA:          1.840 m Patient Age:    50 years      BP:            125/71 mmHg Patient Gender: M             HR:           100 bpm. Exam Location:  [facility] Procedure: 2D Echo, Cardiac Doppler, Color Doppler and Saline Contrast Bubble            Study Indications:     Stroke 434.91/I63.9  History:         Patient has prior history of Echocardiogram examinations, most                  recent 09/07/2020. Previous Myocardial Infarction, COPD; Risk                  Factors:Hypertension.  Sonographer:     Sherrie Sport RDCS (AE) Referring Phys:  [id number] [facility] Diagnosing Phys: Ida Rogue MD  Sonographer Comments: Technically challenging study due to limited acoustic windows and suboptimal apical window. IMPRESSIONS  1. Left ventricular ejection fraction, by estimation, is 50 to 55%. The left ventricle has low normal function. The left ventricle has no regional wall motion abnormalities. Left ventricular diastolic parameters are consistent with Grade I diastolic dysfunction (impaired relaxation).  2. Right ventricular systolic function is normal. The right ventricular size is  normal.  3. Agitated saline contrast bubble study was negative, with no evidence of any interatrial shunt. FINDINGS  Left Ventricle: Left ventricular ejection fraction, by estimation, is 50 to 55%. The left ventricle has low normal function. The left ventricle has no regional wall motion abnormalities. The left ventricular internal cavity size was normal in size. There is no left ventricular hypertrophy. Left ventricular diastolic parameters are consistent with Grade I diastolic dysfunction (impaired relaxation). Right Ventricle: The right ventricular size is normal. No increase in right ventricular wall thickness. Right ventricular systolic function is normal. Left Atrium: Left atrial size was normal in size. Right Atrium: Right atrial size was normal in size. Pericardium: There is no evidence of pericardial effusion. Mitral Valve: The mitral valve is normal in structure. Mild mitral annular  calcification. No evidence of mitral valve regurgitation. No evidence of mitral valve stenosis. Tricuspid Valve: The tricuspid valve is normal in structure. Tricuspid valve regurgitation is not demonstrated. No evidence of tricuspid stenosis. Aortic Valve: The aortic valve is normal in structure. Aortic valve regurgitation is not visualized. No aortic stenosis is present. Aortic valve mean gradient measures 3.0 mmHg. Aortic valve peak gradient measures 6.0 mmHg. Aortic valve area, by VTI measures 3.51 cm. Pulmonic Valve: The pulmonic valve was normal in structure. Pulmonic valve regurgitation is not visualized. No evidence of pulmonic stenosis. Aorta: The aortic root is normal in size and structure. Venous: The inferior vena cava is normal in size with greater than 50% respiratory variability, suggesting right atrial pressure of 3 mmHg. IAS/Shunts: No atrial level shunt detected by color flow Doppler. Agitated saline contrast was given intravenously to evaluate for intracardiac shunting. Agitated saline contrast bubble study was negative, with no evidence of any interatrial shunt.  LEFT VENTRICLE PLAX 2D LVIDd:         4.95 cm  Diastology LVIDs:         3.47 cm  LV e' medial:    5.22 cm/s LV PW:         1.04 cm  LV E/e' medial:  14.6 LV IVS:        0.90 cm  LV e' lateral:   4.13 cm/s LVOT diam:     2.00 cm  LV E/e' lateral: 18.5 LV SV:         69 LV SV Index:   38 LVOT Area:     3.14 cm  RIGHT VENTRICLE RV S prime:     12.50 cm/s TAPSE (M-mode): 3.2 cm LEFT ATRIUM             Index       RIGHT ATRIUM           Index LA diam:        4.40 cm 2.39 cm/m  RA Area:     20.00 cm LA Vol (A2C):   41.8 ml 22.71 ml/m RA Volume:   61.60 ml  33.47 ml/m LA Vol (A4C):   69.5 ml 37.76 ml/m LA Biplane Vol: 57.1 ml 31.03 ml/m  AORTIC VALVE                   PULMONIC VALVE AV Area (Vmax):    2.60 cm    PV Vmax:        0.83 m/s AV Area (Vmean):   2.64 cm    PV Peak grad:   2.7 mmHg AV Area (VTI):     3.51 cm    RVOT Peak  grad: 5 mmHg AV Vmax:  122.00 cm/s AV Vmean:          78.600 cm/s AV VTI:            0.197 m AV Peak Grad:      6.0 mmHg AV Mean Grad:      3.0 mmHg LVOT Vmax:         101.00 cm/s LVOT Vmean:        66.100 cm/s LVOT VTI:          0.220 m LVOT/AV VTI ratio: 1.12  AORTA Ao Root diam: 3.00 cm MITRAL VALVE                TRICUSPID VALVE MV Area (PHT): 2.90 cm     TR Peak grad:   7.4 mmHg MV Decel Time: 262 msec     TR Vmax:        136.00 cm/s MV E velocity: 76.30 cm/s MV A velocity: 120.00 cm/s  SHUNTS MV E/A ratio:  0.64         Systemic VTI:  0.22 m                             Systemic Diam: 2.00 cm Ida Rogue MD Electronically signed by Ida Rogue MD Signature Date/Time: 11/22/2020/7:47:47 PM    Final    DG ESOPHAGUS W SINGLE CM (SOL OR THIN BA)  Result Date: 11/23/2020 CLINICAL DATA:  Trouble swallowing.  Food getting stuck in throat. EXAM: ESOPHOGRAM/BARIUM SWALLOW TECHNIQUE: Single contrast examination was performed using  thin barium. FLUOROSCOPY TIME:  Fluoroscopy Time:  1 minutes and 18 seconds. Radiation Exposure Index (if provided by the fluoroscopic device): 26.5 mGy Number of Acquired Spot Images: COMPARISON:  None. FINDINGS: Single contrast evaluation of the esophagus with the patient in a 45 degree head up position shows no gross mass lesion, diverticulum, or mucosal ulceration. There is a tiny hiatal hernia. Subtle narrowing is seen in the distal esophagus just proximal to the esophagogastric junction. There is some wall irregularity in the same region of the distal esophagus. 13 mm barium tablet becomes lodged in the distal esophagus despite repeated swallows of thin barium and water. IMPRESSION: 1. Limited study due to recent shoulder surgery and patient immobility. 2. Subtle distal esophageal narrowing with mucosal irregularity. 3. 13 mm barium tablet becomes lodged in the distal esophagus just proximal to the esophagogastric junction. Given associated mucosal irregularity in this  region, upper endoscopy recommended to further evaluate. 4. Tiny hiatal hernia. Electronically Signed   By: Misty Stanley M.D.   On: 11/23/2020 12:48     Assessment and Plan:   James Arnold is a 65 y.o. 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, anemia secondary to CKD, known to our cardiology clinic, Presenting for shoulder left surgery, postsurgical seizures, strokes  CVA Multiple seizures November 20, 2020 Appearance of watershed strokes on MRI Request by neurology for TEE and Linq Will need to coordinate timing of procedure with patient, left arm/shoulder with cooling apparatus in place, immobilizer -Will be difficult to position under his left shoulder for procedure, -Left shoulder immobilizer Velcro can be removed to expose location for Linq placement -Procedure discussed with him in detail including risk and complications -If he is in hospital on Monday, procedure could certainly be done early next week.  Potentially could be positioned onto his right shoulder for procedure or supine on his back (  though higher risk of aspiration) -Reason for Linq discussed with him -Currently on aspirin with Plavix -----> will add statin given moderate carotid disease, recent strokes Crestor 20 daily  Nonsustained VT 6 beat run noted November 21, 2020 at 12:27 No further episodes  Would continue monitoring on telemetry Continue outpatient carvedilol  Essential hypertension As outpatient on amlodipine carvedilol losartan and Lasix Somewhat labile blood pressure 010 systolic up to 932, swings likely exaggerated by pain medication  Hyperlipidemia We will add Crestor as detailed above Per the notes he has never tried a statin before, only reports his wife had complications/intolerance  Smoker We have encouraged him to continue to work on weaning his cigarettes and smoking cessation. He will continue to work on this and does  not want any assistance with chantix.   Chronic kidney disease stage IV On peritoneal dialysis  Centrilobular emphysema Noted on CT scan April 2021 Smoking cessation recommended   Total encounter time more than 110 minutes  Greater than 50% was spent in counseling and coordination of care with the patient    For questions or updates, please contact [facility] Please consult [email or web address] for contact info under    Signed, Ida Rogue, MD  11/23/2020 6:58 PM

## 2020-11-23 NOTE — Progress Notes (Signed)
Subjective: 4 Days Post-Op Procedure(s) (LRB): Left reverse shoulder arthroplasty (Left) Patient reports pain as moderate.   Patient is having problems with seizure and tremor activity, but improving.  Feeling much better this AM.   Plan is to go Home after hospital stay. Negative for chest pain and shortness of breath Fever: no Gastrointestinal:negative for nausea and vomiting  Objective: Vital signs in last 24 hours: Temp:  [97.4 F (36.3 C)-98.2 F (36.8 C)] 98 F (36.7 C) (03/04 0500) Pulse Rate:  [78-108] 88 (03/04 0500) Resp:  [12-49] 20 (03/04 0500) BP: (100-138)/(50-88) 130/64 (03/04 0500) SpO2:  [90 %-98 %] 94 % (03/04 0500) Weight:  [75.1 kg] 75.1 kg (03/04 0400)  Intake/Output from previous day:  Intake/Output Summary (Last 24 hours) at 11/23/2020 0630 Last data filed at 11/22/2020 1700 Gross per 24 hour  Intake 120 ml  Output 700 ml  Net -580 ml    Intake/Output this shift: No intake/output data recorded.  Labs: Recent Labs    11/22/20 1223 11/23/20 0452  HGB 7.2* 7.7*   Recent Labs    11/22/20 1223 11/23/20 0452  WBC 4.2 4.4  RBC 2.40* 2.52*  HCT 21.9* 22.8*  PLT 89* 88*   Recent Labs    11/22/20 1223  NA 132*  K 4.6  CL 100  CO2 22  BUN 68*  CREATININE 7.57*  GLUCOSE 96  CALCIUM 7.6*   No results for input(s): LABPT, INR in the last 72 hours.   EXAM General - Patient is Alert and Oriented Extremity - Neurovascular intact Sensation intact distally Dorsiflexion/Plantar flexion intact Dressing/Incision - clean, dry, intact.  The shoulder immobilizer is in place.  Polar care is in place.   Motor Function - Unable to move fingers on exam. Grip limited.  Able to give elbow posterior pressure.    Past Medical History:  Diagnosis Date   Anemia    Benign hypertensive renal disease    CAD (coronary artery disease)    a. 08/1996 s/p BMS to the mLAD ([facility]); b. 12/2017 MV: EF 46%, fixed apical defect w/ significant GI uptake artifact. No  ischemia-> low risk.   CKD (chronic kidney disease), stage V ([place])    on peritoneal dialysis   Complication of anesthesia    please call by "[name]" when waking up!!   COPD (chronic obstructive pulmonary disease) (HCC)    centrilobular emphysema. pt is poorly controlled with his copd/smoking.   Depression    Diastolic dysfunction    a. 12/2017 Echo: EF 60-65%, no rwma, Gr1 DD, mild AI/MR. Nl RVSP.   Dyspnea    with exertion   Fatigue    FSGS (focal segmental glomerulosclerosis)    Hyperlipidemia 10/2002   Hypertension    myelofibrosis    bone marrow failure   Myelofibrosis ([place]) 2010   JAK2 (+) primary   Myocardial infarction ([place]) 1997   stent x 1   Smoker    Thrombocytopenia (HCC)     Assessment/Plan: 4 Days Post-Op Procedure(s) (LRB): Left reverse shoulder arthroplasty (Left) Principal Problem:   Rotator cuff arthropathy Active Problems:   CAD (coronary artery disease)   Primary myelofibrosis (HCC)   Tobacco abuse   Depression   Seizure-like activity (HCC)   Thrombocytopenia (HCC)   Anemia in ESRD (end-stage renal disease) (HCC)   Hyperkalemia   Leukocytosis   Status post shoulder replacement   ESRD on peritoneal dialysis ([place])   Urinary retention  Estimated body mass index is 26.46 kg/m as calculated from the following:  Height as of this encounter: 5' 6.34" (1.685 m).   Weight as of this encounter: 75.1 kg. Advance diet Up with therapy  Appreciate Hospitalist assistance.  Appreciate Neurology assessment.    PT/OT Left UE, gentle ROM.     DVT Prophylaxis - Aspirin   Reche Dixon, PA-C Orthopaedic Surgery 11/23/2020, 6:30 AM

## 2020-11-23 NOTE — Progress Notes (Signed)
Physical Therapy Treatment Patient Details Name: James Arnold MRN: [id number] DOB: [birth year] Today's Date: 11/23/2020    History of Present Illness Pt is a 65 yo male s/p L reverse TSA. PMH of smoking, COPD, past MI, cardiac stents, depression, nightly peritoneal dialysis. Later after surgery pt  developed muscle twitching in his left paraspinal muscles postoperatively and was also witnessed as having generalized twitching and was felt to have a seizure episode. Subsequently moved over to the critical care unit. MRI showed Patchy acute/early subacute cortical infarcts within the right frontal, parietal and occipital lobes (MCA vascular territory, as well as MCA/PCA watershed territory and right PCA territory).  Scattered superimposed microhemorrhages these sites. Small chronic left basal ganglia lacunar infarct.    PT Comments    Pt in bed, family at bedside. Reported 8/10 L shoulder pain. Pt placed in supine for LUE PROM of shoulder as well as AAROM/AROM elbow/wrist/hand exercises. Pt with mildly improved L wrist movement, still unable to squeeze/grip in LUE. Reported decreased light touch sensation to complete LUE as well. Pt and family educated on potential nerve block vs. Potential CVA deficits and to continue to monitor as well as perform elbow/wrist/hand exercises/stretches as able.  Polar care sling, and gown donned/doffed as needed throughout session. Pt exhibited improved mobility this session; supervision for bed mobility, sit <> stand with hemiwalker and CGA. He ambulated ~18ft with hemiwalker and CGA/minA. 1-2 instances of mild LOB, minA to correct. Pt educated on hemiwalker positioning and proper use to maximize safety as well. Up in recliner, educated on proper positioning of sling, pillows and UE while seated, verbalized understanding. The patient would benefit from further skilled PT intervention to continue to progress towards goals. Recommendation remains appropriate and family reported  her adult son will be able to move in and assist if needed.         Follow Up Recommendations  Follow surgeon's recommendation for DC plan and follow-up therapies;Home health PT;Supervision/Assistance - 24 hour     Equipment Recommendations  Other (comment) (hemi walker)    Recommendations for Other Services OT consult     Precautions / Restrictions Precautions Precautions: Fall;Shoulder Shoulder Interventions: Shoulder sling/immobilizer;Shoulder abduction pillow;At all times;Off for dressing/bathing/exercises Precaution Comments: seizures Required Braces or Orthoses: Sling Restrictions Weight Bearing Restrictions: Yes LUE Weight Bearing: Non weight bearing    Mobility  Bed Mobility Overal bed mobility: Needs Assistance Bed Mobility: Supine to Sit     Supine to sit: Supervision;HOB elevated          Transfers Overall transfer level: Needs assistance Equipment used: Hemi-walker Transfers: Sit to/from Stand Sit to Stand: Min guard         General transfer comment: cued for hand placement  Ambulation/Gait Ambulation/Gait assistance: Min guard;Min assist Gait Distance (Feet): 70 Feet Assistive device: Hemi-walker   Gait velocity: decreased   General Gait Details: Pt with improved placement with hemiwalker this AM, did still need some cueing for turning to maximize safety. 1-2 instances of LOB, minA to correct   Stairs             Wheelchair Mobility    Modified Rankin (Stroke Patients Only)       Balance Overall balance assessment: Needs assistance Sitting-balance support: Feet supported Sitting balance-Leahy Scale: Good     Standing balance support: Single extremity supported Standing balance-Leahy Scale: Fair Standing balance comment: improved from previous session but deficits still noted  Cognition Arousal/Alertness: Awake/alert Behavior During Therapy: WFL for tasks assessed/performed Overall  Cognitive Status: Within Functional Limits for tasks assessed                                        Exercises Other Exercises Other Exercises: x10 minutes of LUE PROM shoulder flexion, ER, AROM-AAROM elbow flexion/extension, wrist flexion/extension. Pt still unable to grip with L hand Other Exercises: Pt demonstrated intact LE coordination    General Comments        Pertinent Vitals/Pain Pain Assessment: 0-10 Pain Score: 8  Pain Location: L shoulder with PROM and sling repositioning Pain Descriptors / Indicators: Grimacing;Operative site guarding;Sharp Pain Intervention(s): Limited activity within patient's tolerance;Monitored during session;Ice applied    Home Living                      Prior Function            PT Goals (current goals can now be found in the care plan section) Progress towards PT goals: Progressing toward goals    Frequency    7X/week      PT Plan Current plan remains appropriate    Co-evaluation              AM-PAC PT "6 Clicks" Mobility   Outcome Measure  Help needed turning from your back to your side while in a flat bed without using bedrails?: A Little Help needed moving from lying on your back to sitting on the side of a flat bed without using bedrails?: A Little Help needed moving to and from a bed to a chair (including a wheelchair)?: A Little Help needed standing up from a chair using your arms (e.g., wheelchair or bedside chair)?: A Little Help needed to walk in hospital room?: A Little Help needed climbing 3-5 steps with a railing? : A Little 6 Click Score: 18    End of Session Equipment Utilized During Treatment: Gait belt Activity Tolerance: Patient tolerated treatment well Patient left: with call bell/phone within reach;in chair;with chair alarm set;with family/visitor present Nurse Communication: Mobility status PT Visit Diagnosis: Muscle weakness (generalized) (M62.81);Pain;Other abnormalities  of gait and mobility (R26.89);Difficulty in walking, not elsewhere classified (R26.2) Pain - Right/Left: Left Pain - part of body: Shoulder     Time: 0910-1020 PT Time Calculation (min) (ACUTE ONLY): 70 min  Charges:  $Gait Training: 8-22 mins $Therapeutic Exercise: 53-67 mins                     Lieutenant Diego PT, DPT 11:22 AM,11/23/20

## 2020-11-23 NOTE — Plan of Care (Signed)
  Problem: Education: Goal: Knowledge of General Education information will improve Description: Including pain rating scale, medication(s)/side effects and non-pharmacologic comfort measures 11/23/2020 1325 by Cristela Blue, RN Outcome: Progressing 11/23/2020 1325 by Cristela Blue, RN Outcome: Progressing   Problem: Health Behavior/Discharge Planning: Goal: Ability to manage health-related needs will improve 11/23/2020 1325 by Cristela Blue, RN Outcome: Progressing 11/23/2020 1325 by Cristela Blue, RN Outcome: Progressing   Problem: Clinical Measurements: Goal: Ability to maintain clinical measurements within normal limits will improve 11/23/2020 1325 by Cristela Blue, RN Outcome: Progressing 11/23/2020 1325 by Cristela Blue, RN Outcome: Progressing Goal: Will remain free from infection 11/23/2020 1325 by Cristela Blue, RN Outcome: Progressing 11/23/2020 1325 by Cristela Blue, RN Outcome: Progressing Goal: Diagnostic test results will improve 11/23/2020 1325 by Cristela Blue, RN Outcome: Progressing 11/23/2020 1325 by Cristela Blue, RN Outcome: Progressing Goal: Respiratory complications will improve 11/23/2020 1325 by Cristela Blue, RN Outcome: Progressing 11/23/2020 1325 by Cristela Blue, RN Outcome: Progressing Goal: Cardiovascular complication will be avoided 11/23/2020 1325 by Cristela Blue, RN Outcome: Progressing 11/23/2020 1325 by Cristela Blue, RN Outcome: Progressing   Problem: Activity: Goal: Risk for activity intolerance will decrease 11/23/2020 1325 by Cristela Blue, RN Outcome: Progressing 11/23/2020 1325 by Cristela Blue, RN Outcome: Progressing   Problem: Nutrition: Goal: Adequate nutrition will be maintained 11/23/2020 1325 by Cristela Blue, RN Outcome: Progressing 11/23/2020 1325 by Cristela Blue, RN Outcome: Progressing   Problem: Coping: Goal: Level of anxiety will decrease 11/23/2020 1325 by Cristela Blue, RN Outcome: Progressing 11/23/2020 1325 by  Cristela Blue, RN Outcome: Progressing   Problem: Elimination: Goal: Will not experience complications related to bowel motility 11/23/2020 1325 by Cristela Blue, RN Outcome: Progressing 11/23/2020 1325 by Cristela Blue, RN Outcome: Progressing Goal: Will not experience complications related to urinary retention 11/23/2020 1325 by Cristela Blue, RN Outcome: Progressing 11/23/2020 1325 by Cristela Blue, RN Outcome: Progressing   Problem: Pain Managment: Goal: General experience of comfort will improve 11/23/2020 1325 by Cristela Blue, RN Outcome: Progressing 11/23/2020 1325 by Cristela Blue, RN Outcome: Progressing   Problem: Safety: Goal: Ability to remain free from injury will improve 11/23/2020 1325 by Cristela Blue, RN Outcome: Progressing 11/23/2020 1325 by Cristela Blue, RN Outcome: Progressing   Problem: Skin Integrity: Goal: Risk for impaired skin integrity will decrease 11/23/2020 1325 by Cristela Blue, RN Outcome: Progressing 11/23/2020 1325 by Cristela Blue, RN Outcome: Progressing

## 2020-11-23 NOTE — Progress Notes (Signed)
Occupational Therapy Treatment Patient Details Name: James Arnold MRN: [id number] DOB: January 01, 1956 Today's Date: 11/23/2020    History of present illness Pt is a 65 yo male s/p L reverse TSA. PMH of smoking, COPD, past MI, cardiac stents, depression, nightly peritoneal dialysis. Later after surgery pt  developed muscle twitching in his left paraspinal muscles postoperatively and was also witnessed as having generalized twitching and was felt to have a seizure episode. Subsequently moved over to the critical care unit. MRI showed Patchy acute/early subacute cortical infarcts within the right frontal, parietal and occipital lobes (MCA vascular territory, as well as MCA/PCA watershed territory and right PCA territory).  Scattered superimposed microhemorrhages these sites. Small chronic left basal ganglia lacunar infarct.   OT comments  Pt seen for OT treatment this date to f/u re: post-op L UE mgt. OT engages pt and spouse in re-education session regarding: polar care and sling mgt as well as positioning, importance of OOB activity, attempting to move L hand as much as possible to encourage motor sequencing. Pt and spouse with good understanding. OT engages pt in repositioning with MOD A. Pt requires MOD/MAX A For sling and polar mgt. Declines to get OOB at this time. Will continue to follow acutely. Based on pt's performance with PT this date and strong social support with good understanding of OT's follow-up recommendations and education between pt and spouse, OT updated d/c recommendation to [facility].    Follow Up Recommendations  Follow surgeon's recommendation for DC plan and follow-up therapies;Home health OT    Equipment Recommendations  3 in 1 bedside commode;Tub/shower seat;Other (comment)    Recommendations for Other Services      Precautions / Restrictions Precautions Precautions: Fall;Shoulder Shoulder Interventions: Shoulder sling/immobilizer;Shoulder abduction pillow;At all times;Off for  dressing/bathing/exercises Precaution Booklet Issued: No Precaution Comments: seizures Required Braces or Orthoses: Sling Restrictions Weight Bearing Restrictions: Yes LUE Weight Bearing: Non weight bearing       Mobility Bed Mobility Overal bed mobility: Needs Assistance             General bed mobility comments: MOD A for propulsion toward HOB with bed in trend    Transfers                 General transfer comment: deferred, pt citing fatigue    Balance                                           ADL either performed or assessed with clinical judgement   ADL                                         General ADL Comments: Pt requires MOD/MAX A for sling and polar care mgt. OT again completed ed with pt and spouse     Vision Baseline Vision/History: Wears glasses Patient Visual Report: No change from baseline     Perception     Praxis      Cognition Arousal/Alertness: Awake/alert Behavior During Therapy: WFL for tasks assessed/performed Overall Cognitive Status: Within Functional Limits for tasks assessed  Exercises Other Exercises Other Exercises: OT re-ed re: polar care and sling mgt as well as positioning, importance of OOB activity, attempting to move L hand as much as possible to encourage motor sequencing. Pt and spouse with good understanding. OT engages pt in repositioning with MOD A. Pt requires MOD/MAX A For sling and polar mgt. Declines to get OOB at this time.   Shoulder Instructions       General Comments      Pertinent Vitals/ Pain       Pain Assessment: Faces Faces Pain Scale: Hurts even more Pain Location: L shoulder with PROM and sling repositioning Pain Descriptors / Indicators: Grimacing;Operative site guarding;Sharp Pain Intervention(s): Limited activity within patient's tolerance;Monitored during session;Repositioned;Patient requesting  pain meds-RN notified;Ice applied  Home Living                                          Prior Functioning/Environment              Frequency  Min 2X/week        Progress Toward Goals  OT Goals(current goals can now be found in the care plan section)  Progress towards OT goals: Progressing toward goals  Acute Rehab OT Goals Patient Stated Goal: to get back to normal OT Goal Formulation: With patient/family Time For Goal Achievement: 12/06/20 Potential to Achieve Goals: [place] Discharge plan needs to be updated;Frequency needs to be updated    Co-evaluation                 AM-PAC OT "6 Clicks" Daily Activity     Outcome Measure   Help from another person eating meals?: A Little Help from another person taking care of personal grooming?: A Little Help from another person toileting, which includes using toliet, bedpan, or urinal?: A Lot Help from another person bathing (including washing, rinsing, drying)?: A Lot Help from another person to put on and taking off regular upper body clothing?: A Lot Help from another person to put on and taking off regular lower body clothing?: A Lot 6 Click Score: 14    End of Session    OT Visit Diagnosis: Other abnormalities of gait and mobility (R26.89);Muscle weakness (generalized) (M62.81);Other symptoms and signs involving the nervous system (R29.898);Hemiplegia and hemiparesis;Pain Hemiplegia - Right/Left: Left Hemiplegia - dominant/non-dominant: Non-Dominant Hemiplegia - caused by: Unspecified Pain - Right/Left: Left Pain - part of body: Shoulder   Activity Tolerance Patient tolerated treatment well   Patient Left in bed;with call bell/phone within reach;with family/visitor present   Nurse Communication          Time: 6503-5465 OT Time Calculation (min): 29 min  Charges: OT General Charges $OT Visit: 1 Visit OT Treatments $Self Care/Home Management : 23-37 mins  Gerrianne Scale, [place],  OTR/L ascom [phone] 11/23/20, 6:56 PM

## 2020-11-23 NOTE — Progress Notes (Signed)
This RN removed foley catheter per order. Will bladder scan in 4 hours.

## 2020-11-24 DIAGNOSIS — D638 Anemia in other chronic diseases classified elsewhere: Secondary | ICD-10-CM | POA: Diagnosis not present

## 2020-11-24 DIAGNOSIS — D696 Thrombocytopenia, unspecified: Secondary | ICD-10-CM | POA: Diagnosis not present

## 2020-11-24 DIAGNOSIS — D519 Vitamin B12 deficiency anemia, unspecified: Secondary | ICD-10-CM

## 2020-11-24 DIAGNOSIS — N186 End stage renal disease: Secondary | ICD-10-CM | POA: Diagnosis not present

## 2020-11-24 DIAGNOSIS — R339 Retention of urine, unspecified: Secondary | ICD-10-CM | POA: Diagnosis not present

## 2020-11-24 LAB — CBC
HCT: 22.2 % — ABNORMAL LOW (ref 39.0–52.0)
Hemoglobin: 7.3 g/dL — ABNORMAL LOW (ref 13.0–17.0)
MCH: 29.7 pg (ref 26.0–34.0)
MCHC: 32.9 g/dL (ref 30.0–36.0)
MCV: 90.2 fL (ref 80.0–100.0)
Platelets: 88 10*3/uL — ABNORMAL LOW (ref 150–400)
RBC: 2.46 MIL/uL — ABNORMAL LOW (ref 4.22–5.81)
RDW: 15.7 % — ABNORMAL HIGH (ref 11.5–15.5)
WBC: 4.7 10*3/uL (ref 4.0–10.5)
nRBC: 0 % (ref 0.0–0.2)

## 2020-11-24 LAB — FERRITIN: Ferritin: 870 ng/mL — ABNORMAL HIGH (ref 24–336)

## 2020-11-24 LAB — BASIC METABOLIC PANEL
Anion gap: 12 (ref 5–15)
BUN: 61 mg/dL — ABNORMAL HIGH (ref 8–23)
CO2: 23 mmol/L (ref 22–32)
Calcium: 8 mg/dL — ABNORMAL LOW (ref 8.9–10.3)
Chloride: 99 mmol/L (ref 98–111)
Creatinine, Ser: 7.54 mg/dL — ABNORMAL HIGH (ref 0.61–1.24)
GFR, Estimated: 7 mL/min — ABNORMAL LOW (ref >60)
Glucose, Bld: 95 mg/dL (ref 70–99)
Potassium: 4.8 mmol/L (ref 3.5–5.1)
Sodium: 134 mmol/L — ABNORMAL LOW (ref 135–145)

## 2020-11-24 LAB — VITAMIN B12: Vitamin B-12: 166 pg/mL — ABNORMAL LOW (ref 180–914)

## 2020-11-24 MED ORDER — POLYETHYLENE GLYCOL 3350 17 G PO PACK: 17.0000 g | PACK | Freq: Every day | ORAL | 0 refills | Status: DC | PRN

## 2020-11-24 MED ORDER — MELATONIN 5 MG PO TABS: 5.0000 mg | ORAL_TABLET | Freq: Every evening | ORAL | 0 refills | Status: DC | PRN

## 2020-11-24 MED ORDER — TAMSULOSIN HCL 0.4 MG PO CAPS: 0.4000 mg | ORAL_CAPSULE | Freq: Every day | ORAL | 0 refills | Status: DC

## 2020-11-24 MED ORDER — LEVETIRACETAM 500 MG PO TABS: 500.0000 mg | ORAL_TABLET | Freq: Every day | ORAL | 0 refills | Status: AC

## 2020-11-24 MED ORDER — CLOPIDOGREL BISULFATE 75 MG PO TABS: 75.0000 mg | ORAL_TABLET | Freq: Every day | ORAL | 0 refills | Status: DC

## 2020-11-24 MED ORDER — ASPIRIN 81 MG PO CHEW: 81.0000 mg | CHEWABLE_TABLET | Freq: Every day | ORAL | 0 refills | Status: DC

## 2020-11-24 NOTE — Discharge Instructions (Signed)
James H. Patel, MD  [facility]  Phone: [phone]  Fax: [phone]   Discharge Instructions after Reverse Shoulder Replacement    1. Activity/Sling: You are to be non-weight bearing on operative extremity. A sling/shoulder immobilizer has been provided for you. Only remove the sling to perform elbow, wrist, and hand RoM exercises and hygiene/dressing. Active reaching and lifting are not permitted. You will be given further instructions on sling use at your first physical therapy visit and postoperative visit with Dr. Patel.   2. Dressings: Dressing may be removed at 1st physical therapy visit (~3-4 days after surgery). Afterwards, you may either leave open to air (if no drainage) or cover with dry, sterile dressing. If you have steri-strips on your wound, please do not remove them. They will fall off on their own. You may shower 5 days after surgery. Please pat incision dry. Do not rub or place any shear forces across incision. If there is drainage or any opening of incision after 5 days, please notify our offices immediately.    3. Driving:  Plan on not driving for six weeks. Please note that you are advised NOT to drive while taking narcotic pain medications as you may be impaired and unsafe to drive.   4. Medications:  - You have been provided a prescription for narcotic pain medicine (usually oxycodone). After surgery, take 1-2 narcotic tablets every 4 hours if needed for severe pain. Please start this as soon as you begin to start having pain (if you received a nerve block, start taking as soon as this wears off).  - A prescription for anti-nausea medication will be provided in case the narcotic medicine causes nausea - take 1 tablet every 6 hours only if nauseated.  - Take enteric coated aspirin 325 mg once daily for 6 weeks to prevent blood clots. Do not take aspirin if you have an aspirin sensitivity/allergy or asthma or are on an anticoagulant (blood thinner) already. If so, then  your home anticoagulant will be resume and managed - do not take aspirin. -Take tylenol 1000mg (2 Extra strength or 3 regular strength tablets) every 8 hours for pain. This will reduce the amount of narcotic medication needed. May stop tylenol when you are having minimal pain. - Take a stool softener (Colace, Dulcolax or Senakot) if you are using narcotic pain medications to help with constipation that is associated with narcotic use. - DO NOT take ANY nonsteroidal anti-inflammatory pain medications: Advil, Motrin, Ibuprofen, Aleve, Naproxen, or Naprosyn.   If you are taking prescription medication for anxiety, depression, insomnia, muscle spasm, chronic pain, or for attention deficit disorder you are advised that you are at a higher risk of adverse effects with use of narcotics post-op, including narcotic addiction/dependence, depressed breathing, death. If you use non-prescribed substances: alcohol, marijuana, cocaine, heroin, methamphetamines, etc., you are at a higher risk of adverse effects with use of narcotics post-op, including narcotic addiction/dependence, depressed breathing, death. You are advised that taking > 50 morphine milligram equivalents (MME) of narcotic pain medication per day results in twice the risk of overdose or death. For your prescription provided: oxycodone 5 mg - taking more than 6 tablets per day after the first few days of surgery.   5. Physical Therapy: 1-2 times per week for ~12 weeks. Therapy typically starts on post operative Day 3 or 4. You have been provided an order for physical therapy. The therapist will provide home exercises. Please contact our offices if this appointment has not been scheduled.      6. Work: May do light duty/desk job in approximately 2 weeks when off of narcotics, pain is well-controlled, and swelling has decreased if able to function with one arm in sling. Full work may take 6 weeks if light motions and function of both arms is required.  Lifting jobs may require 12 weeks.   7. Post-Op Appointments: Your first post-op appointment will be with Dr. Patel in approximately 2 weeks time.    If you find that they have not been scheduled please call the Orthopaedic Appointment front desk at [phone].                               James H. Patel, MD [facility] Phone: [phone] Fax: [phone]   REVERSE SHOULDER ARTHROPLASTY REHAB GUIDELINES   These guidelines should be tailored to individual patients based on their rehab goals, age, precautions, quality of repair, etc.  Progression should be based on patient progress and approval by the referring physician.  PHASE 1 - Day 1 through Week 2  GENERAL GUIDELINES AND PRECAUTIONS Sling wear 24/7 except during grooming and home exercises (3 to 5 times daily) Avoid shoulder extension such that the arm is posterior the frontal plane.  When patients recline, a pillow should be placed behind the upper arm and sling should be on.  They should be advised to always be able to see the elbow Avoid combined IR/ADD/EXT, such as hand behind back to prevent dislocation Avoid combined IR and ADD such as reaching across the chest to prevent dislocation No AROM No submersion in pool/water for 4 weeks No weight bearing through operative arm (as in transfers, walker use, etc.)  GOALS Maintain integrity of joint replacement; protect soft tissue healing Increase PROM for elevation to 120 and ER to 30 (will remain the goal for first 6 weeks) Optimize distal UE circulation and muscle activity (elbow, wrist and hand) Instruct in use of sling for proper fit, polar care device for ice application after HEP, signs/symptoms of infection  EXERCISES Active elbow, wrist and hand Passive forward elevation in scapular plane to 90-120 max motion; ER in scapular plane to 30 Active scapular retraction with arms resting in neutral position  CRITERIA TO PROGRESS TO  PHASE 2 Low pain (less than 3/10) with shoulder PROM Healing of incision without signs of infection Clearance by MD to advance after 2 week MD check up  PHASE 2 - 2 weeks - 6 weeks  GENERAL GUIDELINES AND PRECAUTIONS Sling may be removed while at home; worn in community without abduction pillow May use arm for light activities of daily living (such as feeding, brushing teeth, dressing.) with elbow near  the side of the body  and arm in front of the body- no active lifting of the arm May submerge in water (tub, pool, Jacuzzi, etc.) after 4 weeks Continue to avoid WBing through the operative arm Continue to avoid combined IR/EXT/ADD (hand behind the back) and IR/ADD  (reaching across chest) for dislocation precautions  GOALS  Achieve passive elevation to 120 and ER to 30  Low (less than 3/10) to no pain  Ability to fire all heads of the deltoid  EXERCISES May discontinue grip, and active elbow and wrist exercises since using the arm in ADL's  with sling removed around the home Continue passive elevation to 120 and ER to 30, both in scapular plane with arm supported on table top Add submaximal isometrics, pain free effort, for all   functional heads of deltoid (anterior, posterior, middle)  Ensure that with posterior deltoid isometric the shoulder does not move into extension and the arm remains anterior the frontal plane At 4 weeks:  begin to place arm in balanced position of 90 deg elevation in supine; when patient able to hold this position with ease, may begin reverse pendulums clockwise and counterclockwise  CRITERIA TO PROGRESS TO PHASE 3 Passive forward elevation in scapular plane to 120; passive ER in scapular plane to 30 Ability to fire isometrically all heads of the deltoid muscle without pain Ability to place and hold the arm in balanced position (90 deg elevation in supine)  PHASE 3 - 6 weeks to 3 months  GENERAL GUIDELINES AND PRECAUTIONS Discontinue use of sling Avoid  forcing end range motion in any direction to prevent dislocation  May advance use of the arm actively in ADL's without being restricted to arm by the side of the body, however, avoid heavy lifting and sports (forever!) May initiate functional IR behind the back gently NO UPPER BODY ERGOMETER   GOALS Optimize PROM for elevation and ER in scapular plane with realistic expectation that max  mobility for elevation is usually around 145-160 passively; ER 40 to 50 passively; functional IR to L1 Recover AROM to approach as close to PROM available as possible; may expect 135-150 deg active elevation; 30 deg active ER; active functional IR to L1 Establish dynamic stability of the shoulder with deltoid and periscapular muscle gradual strengthening  EXERCISES Forward elevation in scapular plane active progression: supine to incline, to vertical; short to long lever arm Balanced position long lever arm AROM Active ER/IR with arm at side Scapular retraction with light band resistance Functional IR with hand slide up back - very gentle and gradual NO UPPER BODY ERGOMETER     CRITERIA TO PROGRESS TO PHASE 4  AROM equals/approaches PROM with good mechanics for elevation   No pain  Higher level demand on shoulder than ADL functions   PHASE 4 12 months and beyond  GENERAL GUIDELINES AND PRECAUTIONS No heavy lifting and no overhead sports No heavy pushing activity Gradually increase strength of deltoid and scapular stabilizers; also the rotator cuff if present with weights not to exceed 5 lbs NO UPPER BODY ERGOMETER   GOALS  Optimize functional use of the operative UE to meet the desired demands  Gradual increase in deltoid, scapular muscle, and rotator cuff strength  Pain free functional activities   EXERCISES Add light hand weights for deltoid up to and not to exceed 3 lbs for anterior and posterior with long arm lift against gravity; elbow bent to 90 deg for abduction in scapular  plane Theraband progression for extension to hip with scapular depression/retraction Theraband progression for serratus anterior punches in supine; avoid wall, incline or prone pressups for serratus anterior End range stretching gently without forceful overpressure in all planes (elevation in scapular plane, ER in scapular plane, functional IR) with stretching done for life as part of a daily routine NO UPPER BODY ERGOMETER     CRITERIA FOR DISCHARGE FROM SKILLED PHYSICAL THERAPY  Pain free AROM for shoulder elevation (expect around 135-150)  Functional strength for all ADL's, work tasks, and hobbies approved by surgeon  Independence with home maintenance program   NOTES: 1. With proper exercise, motion, strength, and function continue to improve even after one year. 2. The complication rate after surgery is 5 - 8%. Complications include infection, fracture, heterotopic bone formation, nerve injury, instability, rotator cuff   tear, and tuberosity nonunion. Please look for clinical signs, unusual symptoms, or lack of progress with therapy and report those to Dr. Patel. Prefer more communication than less.  3. The therapy plan above only serves as a guide. Please be aware of specific individualized patient instructions as written on the prescription or through discussions with the surgeon. 4. Please call Dr. Patel if you have any specific questions or concerns [phone]    

## 2020-11-24 NOTE — Discharge Summary (Signed)
[facility] [place] NAME: James Arnold    MR#:  [id number]  DATE OF BIRTH:  [birth year]  DATE OF ADMISSION:  11/19/2020 ADMITTING PHYSICIAN: Leim Fabry, MD  DATE OF DISCHARGE: 11/24/2020 10:48 AM  PRIMARY CARE PHYSICIAN: Valerie Roys, DO    ADMISSION DIAGNOSIS:  Rotator cuff arthropathy [M12.819]  DISCHARGE DIAGNOSIS:  Principal Problem:   Rotator cuff arthropathy Active Problems:   CAD (coronary artery disease)   Primary myelofibrosis (HCC)   Tobacco abuse   Depression   Focal seizure (HCC)   Thrombocytopenia (HCC)   Anemia in ESRD (end-stage renal disease) (HCC)   Hyperkalemia   Leukocytosis   Status post shoulder replacement   ESRD on peritoneal dialysis [facility])   Urinary retention   Acute CVA (cerebrovascular accident) ([place])   Anemia of chronic disease   SECONDARY DIAGNOSIS:   Past Medical History:  Diagnosis Date  . Anemia   . Benign hypertensive renal disease   . CAD (coronary artery disease)    a. 08/1996 s/p BMS to the mLAD ([facility]); b. 12/2017 MV: EF 46%, fixed apical defect w/ significant GI uptake artifact. No ischemia-> low risk.  . CKD (chronic kidney disease), stage V ([place])    on peritoneal dialysis  . Complication of anesthesia    please call by "Ysidra Sopher" when waking up!!  . COPD (chronic obstructive pulmonary disease) ([place])    centrilobular emphysema. pt is poorly controlled with his copd/smoking.  . Depression   . Diastolic dysfunction    a. 12/2017 Echo: EF 60-65%, no rwma, Gr1 DD, mild AI/MR. Nl RVSP.  [name] Kitchen Dyspnea    with exertion  . Fatigue   . FSGS (focal segmental glomerulosclerosis)   . Hyperlipidemia 10/2002  . Hypertension   . myelofibrosis    bone marrow failure  . Myelofibrosis ([place]) 2010   JAK2 (+) primary  . Myocardial infarction ([place]) 1997   stent x 1  . Smoker   . Thrombocytopenia ([place])     HOSPITAL COURSE:   1.  Acute to subacute cortical infarcts within the right frontal, right  parietal and occipital lobes.  Patient on aspirin and I recommended Plavix for 21 total days then can stop.  He received 2 days here in the hospital Plavix for another 19 days of Plavix.  Patient and wife refuse cholesterol medication at this time.  Patient does have left hand weakness and cannot extend his fingers and very weak with grip strength which could be secondary to the stroke.  Neurology recommended a TEE.  Dr. Rockey Situ saw the patient in consultation.  Patient normally sees Dr. Saunders Revel as outpatient.  He will be set up for a TEE and Linq recorder as outpatient. 2.  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  Acute urinary retention.  Patient passed a voiding trial yesterday and was able to urinate yesterday and again this morning.  Continue Flomax 4.  Rotator cuff arthroplasty status post shoulder replacement by Dr. Posey Pronto on 11/19/2020 5.  End-stage renal disease on peritoneal dialysis 6.  Myelofibrosis follow-up with Dr. Rogue Bussing as outpatient.  Patient has anemia of chronic disease.  Patient's hemoglobin 7.3 upon disposition.  Patient did not want a blood transfusion.Patient's platelet count is 88. 7.  Vitamin B12 deficiency.  Vitamin B 12 level 166.  This result came back after the patient was discharged and I spoke with the patient's wife about supplementing vitamin B12 1000 units daily. 8.  Essential hypertension on Norvasc, Coreg 9.  Depression on Celexa 10.  Dysphagia.  Barium swallow showed a distal esophageal tightening.  Case discussed with GI and since the tightening is distal they can proceed with a TEE.  Patient will likely need an endoscopy but should be done after the patient is off Plavix.  Has seen Dr. Vicente Males in the past.  DISCHARGE CONDITIONS:   Satisfactory  CONSULTS OBTAINED:  Treatment Team:  Hollice Espy, MD  Cardiology Nephrology orthopedic surgery  DRUG ALLERGIES:  No Known Allergies  DISCHARGE MEDICATIONS:   Allergies as of 11/24/2020   No Known Allergies     Medication  List    STOP taking these medications   ruxolitinib phosphate 15 MG tablet Commonly known as: JAKAFI   traMADol 50 MG tablet Commonly known as: ULTRAM     TAKE these medications   amLODipine 10 MG tablet Commonly known as: NORVASC Take 1 tablet (10 mg total) by mouth daily.   aspirin 81 MG chewable tablet Chew 1 tablet (81 mg total) by mouth daily. Start taking on: November 25, 2020   carvedilol 12.5 MG tablet Commonly known as: COREG Take 1.5 tablets (18.75 mg total) by mouth 2 (two) times daily.   Chantix Starting Month Pak 0.5 MG X 11 & 1 MG X 42 tablet Generic drug: varenicline Take one 0.5 mg tablet by mouth once daily for 3 days, then increase to one 0.5 mg tablet twice daily for 4 days, then increase to one 1 mg tablet twice daily. What changed: Another medication with the same name was removed. Continue taking this medication, and follow the directions you see here.   citalopram 40 MG tablet Commonly known as: CELEXA TAKE 1 TABLET EVERY DAY   clopidogrel 75 MG tablet Commonly known as: PLAVIX Take 1 tablet (75 mg total) by mouth daily for 19 days. Start taking on: November 25, 2020   ferrous sulfate 325 (65 FE) MG tablet Take 1 tablet (325 mg total) by mouth daily with breakfast.   furosemide 20 MG tablet Commonly known as: LASIX Take 1 tablet (20 mg total) by mouth daily.   gentamicin cream 0.1 % Commonly known as: GARAMYCIN Apply 1 application topically every other day.   levETIRAcetam 500 MG tablet Commonly known as: KEPPRA Take 1 tablet (500 mg total) by mouth at bedtime.   losartan 50 MG tablet Commonly known as: COZAAR Take 1 tablet (50 mg total) by mouth daily.   melatonin 5 MG Tabs Take 1 tablet (5 mg total) by mouth at bedtime as needed.   nicotine 14 mg/24hr patch Commonly known as: Nicoderm CQ Place 1 patch (14 mg total) onto the skin daily.   ondansetron 8 MG disintegrating tablet Commonly known as: Zofran ODT Take 1 tablet (8 mg total) by  mouth every 8 (eight) hours as needed for nausea or vomiting.   oxyCODONE 5 MG immediate release tablet Commonly known as: Oxy IR/ROXICODONE Take 1-2 tablets (5-10 mg total) by mouth every 4 (four) hours as needed for moderate pain (pain score 4-6).   polyethylene glycol 17 g packet Commonly known as: MIRALAX / GLYCOLAX Take 17 g by mouth daily as needed.   predniSONE 10 MG tablet Commonly known as: DELTASONE Take 10 mg by mouth daily with breakfast.   Retacrit 10000 UNIT/ML injection Generic drug: epoetin alfa-epbx Inject into the skin. Once a month at dialysis   tamsulosin 0.4 MG Caps capsule Commonly known as: FLOMAX Take 1 capsule (0.4 mg total) by mouth daily. Start taking on: November 25, 2020   Vitamin D-1000 Max St 25 MCG (1000 UT) tablet Generic drug: Cholecalciferol Take 1,000 Units by mouth daily.            Durable Medical Equipment  (From admission, onward)         Start     Ordered   11/23/20 1138  For home use only DME Walker hemi  Once       Question:  Patient needs a walker to treat with the following condition  Answer:  H/O shoulder surgery   11/23/20 1138           DISCHARGE INSTRUCTIONS:   Follow-up PMD 5 days (from there can get neurology referral) follow-up cardiology for TEE and Linq recorder follow-up gastroenterology once off Plavix  If you experience worsening of your admission symptoms, develop shortness of breath, life threatening emergency, suicidal or homicidal thoughts you must seek medical attention immediately by calling 911 or calling your MD immediately  if symptoms less severe.  You Must read complete instructions/literature along with all the possible adverse reactions/side effects for all the Medicines you take and that have been prescribed to you. Take any new Medicines after you have completely understood and accept all the possible adverse reactions/side effects.   Please note  You were cared for by a hospitalist during  your hospital stay. If you have any questions about your discharge medications or the care you received while you were in the hospital after you are discharged, you can call the unit and asked to speak with the hospitalist on call if the hospitalist that took care of you is not available. Once you are discharged, your primary care physician will handle any further medical issues. Please note that NO REFILLS for any discharge medications will be authorized once you are discharged, as it is imperative that you return to your primary care physician (or establish a relationship with a primary care physician if you do not have one) for your aftercare needs so that they can reassess your need for medications and monitor your lab values.    Today   CHIEF COMPLAINT:  No chief complaint on file.   HISTORY OF PRESENT ILLNESS:  Jayse Hodkinson  is a 65 y.o. male had a shoulder surgery then found to have a seizure and stroke.   VITAL SIGNS:  Blood pressure 138/69, pulse 85, temperature 98.1 F (36.7 C), temperature source Oral, resp. rate 14, height 5' 6.34" (1.685 m), weight 75.7 kg, SpO2 98 %.  I/O:    Intake/Output Summary (Last 24 hours) at 11/24/2020 1434 Last data filed at 11/24/2020 0740 Gross per 24 hour  Intake 360 ml  Output 0 ml  Net 360 ml    PHYSICAL EXAMINATION:  GENERAL:  65 y.o.-year-old patient lying in the bed with no acute distress.  EYES: Pupils equal, round, reactive to light and accommodation. No scleral icterus. Extraocular muscles intact.  HEENT: Head atraumatic, normocephalic. Oropharynx and nasopharynx clear.  LUNGS: Normal breath sounds bilaterally, no wheezing, rales,rhonchi or crepitation. No use of accessory muscles of respiration.  CARDIOVASCULAR: S1, S2 normal. No murmurs, rubs, or gallops.  ABDOMEN: Soft, non-tender, non-distended.  EXTREMITIES: No pedal edema.  NEUROLOGIC: Cranial nerves II through XII are intact. Muscle strength 5/5 in all extremities. Sensation  intact. Gait not checked.  PSYCHIATRIC: The patient is alert and oriented x 3.  SKIN: No obvious rash, lesion, or ulcer.   DATA REVIEW:   CBC Recent Labs  Lab 11/24/20 0547  WBC 4.7  HGB 7.3*  HCT 22.2*  PLT 88*    Chemistries  Recent Labs  Lab 11/20/20 0423 11/22/20 1223 11/24/20 0547  NA 134*   < > 134*  K 5.2*   < > 4.8  CL 103   < > 99  CO2 18*   < > 23  GLUCOSE 125*   < > 95  BUN 78*   < > 61*  CREATININE 7.95*   < > 7.54*  CALCIUM 7.6*   < > 8.0*  MG 2.0  --   --   AST 13*  --   --   ALT 8  --   --   ALKPHOS 45  --   --   BILITOT 0.6  --   --    < > = values in this interval not displayed.    Microbiology Results  Results for orders placed or performed during the hospital encounter of 11/15/20  Surgical pcr screen     Status: Abnormal   Collection Time: 11/15/20  8:20 AM   Specimen: Nasal Mucosa; Nasal Swab  Result Value Ref Range Status   MRSA, PCR NEGATIVE NEGATIVE Final   Staphylococcus aureus POSITIVE (A) NEGATIVE Final    Comment: RESULT CALLED TO, READ BACK BY AND VERIFIED WITH: REED RENO RN AT 1452 ON 11/15/20 SNG (NOTE) The Xpert SA Assay (FDA approved for NASAL specimens in patients 33 years of age and older), is one component of a comprehensive surveillance program. It is not intended to diagnose infection nor to guide or monitor treatment. Performed at [facility], [place], [place] [address]   SARS CORONAVIRUS 2 (TAT 6-24 HRS) Nasopharyngeal Nasopharyngeal Swab     Status: None   Collection Time: 11/15/20  9:25 AM   Specimen: Nasopharyngeal Swab  Result Value Ref Range Status   SARS Coronavirus 2 NEGATIVE NEGATIVE Final    Comment: (NOTE) SARS-CoV-2 target nucleic acids are NOT DETECTED.  The SARS-CoV-2 RNA is generally detectable in upper and lower respiratory specimens during the acute phase of infection. Negative results do not preclude SARS-CoV-2 infection, do not rule out co-infections with other  pathogens, and should not be used as the sole basis for treatment or other patient management decisions. Negative results must be combined with clinical observations, patient history, and epidemiological information. The expected result is Negative.  Fact Sheet for Patients: SugarRoll.be  Fact Sheet for Healthcare Providers: https://www.woods-mathews.com/  This test is not yet approved or cleared by the [place] FDA and  has been authorized for detection and/or diagnosis of SARS-CoV-2 by FDA under an Emergency Use Authorization (EUA). This EUA will remain  in effect (meaning this test can be used) for the duration of the COVID-19 declaration under Se ction 564(b)(1) of the Act, 21 U.S.C. section 360bbb-3(b)(1), unless the authorization is terminated or revoked sooner.  Performed at [facility], [place] [address] [place],  [address]     RADIOLOGY:  US Carotid Bilateral  Result Date: 11/22/2020 CLINICAL DATA:  Acute cerebral infarction, hypertension, hyperlipidemia and history of coronary artery disease. EXAM: BILATERAL CAROTID DUPLEX ULTRASOUND TECHNIQUE: Pearline Cables scale imaging, color Doppler and duplex ultrasound were performed of bilateral carotid and vertebral arteries in the neck. COMPARISON:  None. FINDINGS: Criteria: Quantification of carotid stenosis is based on velocity parameters that correlate the residual internal carotid diameter with NASCET-based stenosis levels, using the diameter of the distal internal carotid lumen as the denominator for stenosis measurement. The following velocity measurements were  obtained: RIGHT ICA:  120/30 cm/sec CCA:  11/91 cm/sec SYSTOLIC ICA/CCA RATIO:  1.2 ECA:  153 cm/sec LEFT ICA:  105/30 cm/sec CCA:  47/82 cm/sec SYSTOLIC ICA/CCA RATIO:  1.1 ECA:  110 cm/sec RIGHT CAROTID ARTERY: Moderate calcified plaque at the level of the carotid bulb and proximal right ICA. Estimated right ICA stenosis is  less than 50%. RIGHT VERTEBRAL ARTERY: Antegrade flow with normal waveform and velocity. LEFT CAROTID ARTERY: Moderate calcified plaque at the level of the left carotid bulb and proximal left ICA. Estimated left ICA stenosis is less than 50%. LEFT VERTEBRAL ARTERY: Antegrade flow with normal waveform and velocity. IMPRESSION: Moderate plaque at the level of both carotid bulbs and proximal internal carotid arteries. Estimated bilateral ICA stenoses are less than 50%. Electronically Signed   By: Aletta Edouard M.D.   On: 11/22/2020 14:56   DG ESOPHAGUS W SINGLE CM (SOL OR THIN BA)  Result Date: 11/23/2020 CLINICAL DATA:  Trouble swallowing.  Food getting stuck in throat. EXAM: ESOPHOGRAM/BARIUM SWALLOW TECHNIQUE: Single contrast examination was performed using  thin barium. FLUOROSCOPY TIME:  Fluoroscopy Time:  1 minutes and 18 seconds. Radiation Exposure Index (if provided by the fluoroscopic device): 26.5 mGy Number of Acquired Spot Images: COMPARISON:  None. FINDINGS: Single contrast evaluation of the esophagus with the patient in a 45 degree head up position shows no gross mass lesion, diverticulum, or mucosal ulceration. There is a tiny hiatal hernia. Subtle narrowing is seen in the distal esophagus just proximal to the esophagogastric junction. There is some wall irregularity in the same region of the distal esophagus. 13 mm barium tablet becomes lodged in the distal esophagus despite repeated swallows of thin barium and water. IMPRESSION: 1. Limited study due to recent shoulder surgery and patient immobility. 2. Subtle distal esophageal narrowing with mucosal irregularity. 3. 13 mm barium tablet becomes lodged in the distal esophagus just proximal to the esophagogastric junction. Given associated mucosal irregularity in this region, upper endoscopy recommended to further evaluate. 4. Tiny hiatal hernia. Electronically Signed   By: Misty Stanley M.D.   On: 11/23/2020 12:48     Management plans discussed  with the patient, family and they are in agreement.  CODE STATUS:     Code Status Orders  (From admission, onward)         Start     Ordered   11/19/20 1302  Full code  Continuous        11/19/20 1301        Code Status History    Date Active Date Inactive Code Status Order ID Comments User Context   12/29/2017 0223 12/30/2017 2003 Full Code [id number]  Lance Coon, MD Inpatient   11/25/2017 1532 11/26/2017 1607 Full Code [id number]  Anthonette Legato, MD Inpatient   Advance Care Planning Activity    Advance Directive Documentation   Flowsheet Row Most Recent Value  Type of Advance Directive Healthcare Power of Attorney, Living will  Pre-existing out of facility DNR order (yellow form or pink MOST form) --  "MOST" Form in Place? --      TOTAL TIME TAKING CARE OF THIS PATIENT: 35 minutes.    Loletha Grayer M.D on 11/24/2020 at 2:34 PM  Between 7am to 6pm - Pager - [phone]  After 6pm go to [email or web address] - password [email or web address]  [facility]  CC: Primary care physician; Valerie Roys, DO

## 2020-11-24 NOTE — Progress Notes (Signed)
PT Cancellation Note  Patient Details Name: James Arnold MRN: [id number] DOB: 1956-05-14   Cancelled Treatment:     PT attempt. Pt refused. "I'm ready to go home! I need to get out of here. I had a bad night" Pt is agreeable to [facility] services at [place].    Willette Pa 11/24/2020, 9:23 AM

## 2020-11-24 NOTE — Progress Notes (Signed)
Patient discharged at this time to home with spouse. Verbalized discharge instructions per AVS and teachings of ambulatory status with new equipment provided by PT. PIV/Tele removed by Probation officer. Equipment in place and taken home with patient. NAD noted upon departure.

## 2020-11-24 NOTE — Progress Notes (Signed)
[facility]  ROUNDING NOTE   Subjective:    Peritoneal dialysis last night. Fibrin in effluent. Tolerated treatment well.  Wife and granddaughter at bedside.   UF 566mL.   Objective:  Vital signs in last 24 hours:  Temp:  [97.6 F (36.4 C)-98.2 F (36.8 C)] 98.1 F (36.7 C) (03/05 0740) Pulse Rate:  [77-87] 85 (03/05 0740) Resp:  [14-19] 14 (03/05 0740) BP: (102-146)/(62-75) 138/69 (03/05 0740) SpO2:  [93 %-100 %] 98 % (03/05 0740) Weight:  [75.7 kg] 75.7 kg (03/05 0415)  Weight change: 0.59 kg Filed Weights   11/19/20 0614 11/23/20 0400 11/24/20 0415  Weight: 73.9 kg 75.1 kg 75.7 kg    Intake/Output: I/O last 3 completed shifts: In: 720 [P.O.:720] Out: -    Intake/Output this shift:  No intake/output data recorded.  Physical Exam: General:  No acute distress  Head:  Normocephalic, atraumatic. Moist oral mucosal membranes  Eyes:  Anicteric  Neck:  Supple  Lungs:   Clear to auscultation, normal effort  Heart:  regular  Abdomen:   Soft, nontender, bowel sounds present  Extremities: Left shoulder in brace  Neurologic:  Awake, alert  Skin:  No lesions  Access:  PD catheter in place    Basic Metabolic Panel: Recent Labs  Lab 11/19/20 0626 11/20/20 0423 11/22/20 1223 11/24/20 0547  NA 137 134* 132* 134*  K 4.6 5.2* 4.6 4.8  CL 105 103 100 99  CO2  --  18* 22 23  GLUCOSE 100* 125* 96 95  BUN 71* 78* 68* 61*  CREATININE 7.30* 7.95* 7.57* 7.54*  CALCIUM  --  7.6* 7.6* 8.0*  MG  --  2.0  --   --   PHOS  --   --  7.8*  --     Liver Function Tests: Recent Labs  Lab 11/20/20 0423 11/22/20 1223  AST 13*  --   ALT 8  --   ALKPHOS 45  --   BILITOT 0.6  --   PROT 5.6*  --   ALBUMIN 2.9* 2.6*   No results for input(s): LIPASE, AMYLASE in the last 168 hours. No results for input(s): AMMONIA in the last 168 hours.  CBC: Recent Labs  Lab 11/19/20 0626 11/20/20 0423 11/22/20 1223 11/23/20 0452 11/24/20 0547  WBC  --  11.4* 4.2 4.4  4.7  HGB 9.5* 8.6* 7.2* 7.7* 7.3*  HCT 28.0* 25.6* 21.9* 22.8* 22.2*  MCV  --  89.5 91.3 90.5 90.2  PLT  --  121* 89* 88* 88*    Cardiac Enzymes: No results for input(s): CKTOTAL, CKMB, CKMBINDEX, TROPONINI in the last 168 hours.  BNP: Invalid input(s): POCBNP  CBG: Recent Labs  Lab 11/20/20 0303 11/20/20 0321 11/20/20 0418  GLUCAP 121* 126* 95*    Microbiology: Results for orders placed or performed during the hospital encounter of 11/15/20  Surgical pcr screen     Status: Abnormal   Collection Time: 11/15/20  8:20 AM   Specimen: Nasal Mucosa; Nasal Swab  Result Value Ref Range Status   MRSA, PCR NEGATIVE NEGATIVE Final   Staphylococcus aureus POSITIVE (A) NEGATIVE Final    Comment: RESULT CALLED TO, READ BACK BY AND VERIFIED WITH: REED RENO RN AT 1452 ON 11/15/20 SNG (NOTE) The Xpert SA Assay (FDA approved for NASAL specimens in patients 32 years of age and older), is one component of a comprehensive surveillance program. It is not intended to diagnose infection nor to guide or monitor treatment. Performed at [facility]  Lab, [place], [place] [address]   SARS CORONAVIRUS 2 (TAT 6-24 HRS) Nasopharyngeal Nasopharyngeal Swab     Status: None   Collection Time: 11/15/20  9:25 AM   Specimen: Nasopharyngeal Swab  Result Value Ref Range Status   SARS Coronavirus 2 NEGATIVE NEGATIVE Final    Comment: (NOTE) SARS-CoV-2 target nucleic acids are NOT DETECTED.  The SARS-CoV-2 RNA is generally detectable in upper and lower respiratory specimens during the acute phase of infection. Negative results do not preclude SARS-CoV-2 infection, do not rule out co-infections with other pathogens, and should not be used as the sole basis for treatment or other patient management decisions. Negative results must be combined with clinical observations, patient history, and epidemiological information. The expected result is Negative.  Fact Sheet for  Patients: SugarRoll.be  Fact Sheet for Healthcare Providers: https://www.woods-mathews.com/  This test is not yet approved or cleared by the [place] FDA and  has been authorized for detection and/or diagnosis of SARS-CoV-2 by FDA under an Emergency Use Authorization (EUA). This EUA will remain  in effect (meaning this test can be used) for the duration of the COVID-19 declaration under Se ction 564(b)(1) of the Act, 21 U.S.C. section 360bbb-3(b)(1), unless the authorization is terminated or revoked sooner.  Performed at [facility], [place] [address] [place], [place] [address]     Coagulation Studies: No results for input(s): LABPROT, INR in the last 72 hours.  Urinalysis: No results for input(s): COLORURINE, LABSPEC, PHURINE, GLUCOSEU, HGBUR, BILIRUBINUR, KETONESUR, PROTEINUR, UROBILINOGEN, NITRITE, LEUKOCYTESUR in the last 72 hours.  Invalid input(s): APPERANCEUR    Imaging: MR ANGIO HEAD WO CONTRAST  Result Date: 11/22/2020 CLINICAL DATA:  Neuro deficit, acute, stroke suspected. EXAM: MRA HEAD WITHOUT CONTRAST TECHNIQUE: Angiographic images of the Circle of Willis were obtained using MRA technique without intravenous contrast. COMPARISON:  MRI of the brain November 21, 2020. FINDINGS: Luminal irregularity with mild-to-moderate stenosis at the petrous cavernous segment of the right ICA. Mild luminal irregularity of the cavernous left ICA without hemodynamically significant stenosis. The bilateral anterior cerebral arteries and middle cerebral arteries are widely patent with antegrade flow without high-grade flow-limiting stenosis or proximal branch occlusion. No intracranial aneurysm within the anterior circulation. The vertebral arteries are widely patent with antegrade flow. Vertebrobasilar junction and basilar artery are widely patent with antegrade flow without evidence of basilar stenosis or aneurysm. Posterior cerebral arteries are  normal bilaterally. No intracranial aneurysm within the posterior circulation. IMPRESSION: 1. No evidence of high-grade flow-limiting stenosis, proximal branch occlusion, or intracranial aneurysm. 2. Luminal irregularity with mild-to-moderate stenosis at the petrous cavernous segment of the right ICA. Electronically Signed   By: Pedro Earls M.D.   On: 11/22/2020 15:25   US Carotid Bilateral  Result Date: 11/22/2020 CLINICAL DATA:  Acute cerebral infarction, hypertension, hyperlipidemia and history of coronary artery disease. EXAM: BILATERAL CAROTID DUPLEX ULTRASOUND TECHNIQUE: Pearline Cables scale imaging, color Doppler and duplex ultrasound were performed of bilateral carotid and vertebral arteries in the neck. COMPARISON:  None. FINDINGS: Criteria: Quantification of carotid stenosis is based on velocity parameters that correlate the residual internal carotid diameter with NASCET-based stenosis levels, using the diameter of the distal internal carotid lumen as the denominator for stenosis measurement. The following velocity measurements were obtained: RIGHT ICA:  120/30 cm/sec CCA:  71/06 cm/sec SYSTOLIC ICA/CCA RATIO:  1.2 ECA:  153 cm/sec LEFT ICA:  105/30 cm/sec CCA:  26/94 cm/sec SYSTOLIC ICA/CCA RATIO:  1.1 ECA:  110 cm/sec RIGHT  CAROTID ARTERY: Moderate calcified plaque at the level of the carotid bulb and proximal right ICA. Estimated right ICA stenosis is less than 50%. RIGHT VERTEBRAL ARTERY: Antegrade flow with normal waveform and velocity. LEFT CAROTID ARTERY: Moderate calcified plaque at the level of the left carotid bulb and proximal left ICA. Estimated left ICA stenosis is less than 50%. LEFT VERTEBRAL ARTERY: Antegrade flow with normal waveform and velocity. IMPRESSION: Moderate plaque at the level of both carotid bulbs and proximal internal carotid arteries. Estimated bilateral ICA stenoses are less than 50%. Electronically Signed   By: Aletta Edouard M.D.   On: 11/22/2020 14:56   DG  ESOPHAGUS W SINGLE CM (SOL OR THIN BA)  Result Date: 11/23/2020 CLINICAL DATA:  Trouble swallowing.  Food getting stuck in throat. EXAM: ESOPHOGRAM/BARIUM SWALLOW TECHNIQUE: Single contrast examination was performed using  thin barium. FLUOROSCOPY TIME:  Fluoroscopy Time:  1 minutes and 18 seconds. Radiation Exposure Index (if provided by the fluoroscopic device): 26.5 mGy Number of Acquired Spot Images: COMPARISON:  None. FINDINGS: Single contrast evaluation of the esophagus with the patient in a 45 degree head up position shows no gross mass lesion, diverticulum, or mucosal ulceration. There is a tiny hiatal hernia. Subtle narrowing is seen in the distal esophagus just proximal to the esophagogastric junction. There is some wall irregularity in the same region of the distal esophagus. 13 mm barium tablet becomes lodged in the distal esophagus despite repeated swallows of thin barium and water. IMPRESSION: 1. Limited study due to recent shoulder surgery and patient immobility. 2. Subtle distal esophageal narrowing with mucosal irregularity. 3. 13 mm barium tablet becomes lodged in the distal esophagus just proximal to the esophagogastric junction. Given associated mucosal irregularity in this region, upper endoscopy recommended to further evaluate. 4. Tiny hiatal hernia. Electronically Signed   By: Misty Stanley M.D.   On: 11/23/2020 12:48     Medications:   . dialysis solution 1.5% low-MG/low-CA     . amLODipine  10 mg Oral Daily  . aspirin  81 mg Oral Daily  . carvedilol  18.75 mg Oral BID WC  . Chlorhexidine Gluconate Cloth  6 each Topical Daily  . cholecalciferol  1,000 Units Oral Daily  . citalopram  40 mg Oral Daily  . clopidogrel  75 mg Oral Daily  . docusate sodium  100 mg Oral BID  . ferrous sulfate  325 mg Oral Q breakfast  . furosemide  20 mg Oral Daily  . gentamicin cream  1 application Topical Daily  . levETIRAcetam  500 mg Oral Q24H  . losartan  50 mg Oral Daily  .  neomycin-polymyxin B  20 mL Irrigation Once  . nicotine  14 mg Transdermal Daily  . polyethylene glycol  17 g Oral BID  . predniSONE  10 mg Oral Q breakfast  . rosuvastatin  20 mg Oral QPM  . senna-docusate  1 tablet Oral BID  . tamsulosin  0.4 mg Oral Daily   acetaminophen, alum & mag hydroxide-simeth, bisacodyl, calcium carbonate, diphenhydrAMINE, heparin, hydrALAZINE, HYDROmorphone (DILAUDID) injection, LORazepam, melatonin, menthol-cetylpyridinium **OR** phenol, methocarbamol, ondansetron (ZOFRAN) IV, oxyCODONE, oxyCODONE, senna-docusate  Assessment/ Plan:   [name] is a 65 y.o. white male with end stage renal disease on peritoneal dialysis, myelodysplastic syndrome, hypertension, who underwent left reverse total shoulder arthroplasty by Dr. Leim Fabry on 11/19/20 and developed muscle twitching and possible seizure-like activity postoperatively.  [facility] 80kg CCPD 8 hours 2 Liter fills 4 cycles   1.  ESRD on PD.  Continue peritoneal dialysis. May need heparin with dextrose.  Wife to do treatments at home.   2.  Anemia of chronic kidney disease. Hemoglobin 7.3 - EPO as outpatient  3.  Secondary hyperparathyroidism.  Not on a binder  4.  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  Stable on Keppra No additional seizure activity   LOS: 5 Sarath Kolluru 3/5/202211:39 AM

## 2020-11-24 NOTE — Progress Notes (Signed)
Subjective: 5 Days Post-Op Procedure(s) (LRB): Left reverse shoulder arthroplasty (Left) Patient reports pain as mild.   Patient with complaints this am. Plan is to go Home after hospital stay. Negative for chest pain and shortness of breath Fever: no Gastrointestinal:negative for nausea and vomiting  Objective: Vital signs in last 24 hours: Temp:  [97.6 F (36.4 C)-98.2 F (36.8 C)] 98.1 F (36.7 C) (03/05 0740) Pulse Rate:  [77-87] 85 (03/05 0740) Resp:  [14-19] 14 (03/05 0740) BP: (102-146)/(62-75) 138/69 (03/05 0740) SpO2:  [93 %-100 %] 98 % (03/05 0740) Weight:  [75.7 kg] 75.7 kg (03/05 0415)  Intake/Output from previous day:  Intake/Output Summary (Last 24 hours) at 11/24/2020 0857 Last data filed at 11/24/2020 0740 Gross per 24 hour  Intake 720 ml  Output 0 ml  Net 720 ml    Intake/Output this shift: No intake/output data recorded.  Labs: Recent Labs    11/22/20 1223 11/23/20 0452 11/24/20 0547  HGB 7.2* 7.7* 7.3*   Recent Labs    11/23/20 0452 11/24/20 0547  WBC 4.4 4.7  RBC 2.52* 2.46*  HCT 22.8* 22.2*  PLT 88* 88*   Recent Labs    11/22/20 1223 11/24/20 0547  NA 132* 134*  K 4.6 4.8  CL 100 99  CO2 22 23  BUN 68* 61*  CREATININE 7.57* 7.54*  GLUCOSE 96 95  CALCIUM 7.6* 8.0*   No results for input(s): LABPT, INR in the last 72 hours.   EXAM General - Patient is Alert and Oriented Extremity - Neurovascular intact Sensation intact distally Dorsiflexion/Plantar flexion intact Dressing/Incision - clean, dry, intact.  The shoulder immobilizer is in place.  Polar care is in place.   Motor Function - Unable to move fingers on exam. Grip limited.  Able to give elbow posterior pressure.  Slight active wrist extension present.  Past Medical History:  Diagnosis Date  . Anemia   . Benign hypertensive renal disease   . CAD (coronary artery disease)    a. 08/1996 s/p BMS to the mLAD ([facility]); b. 12/2017 MV: EF 46%, fixed apical defect w/  significant GI uptake artifact. No ischemia-> low risk.  . CKD (chronic kidney disease), stage V ([place])    on peritoneal dialysis  . Complication of anesthesia    please call by "[name]" when waking up!!  . COPD (chronic obstructive pulmonary disease) ([place])    centrilobular emphysema. pt is poorly controlled with his copd/smoking.  . Depression   . Diastolic dysfunction    a. 12/2017 Echo: EF 60-65%, no rwma, Gr1 DD, mild AI/MR. Nl RVSP.  [name] Kitchen Dyspnea    with exertion  . Fatigue   . FSGS (focal segmental glomerulosclerosis)   . Hyperlipidemia 10/2002  . Hypertension   . myelofibrosis    bone marrow failure  . 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 Myocardial infarction ([place]) 1997   stent x 1  . Smoker   . Thrombocytopenia ([facility])     Assessment/Plan: 5 Days Post-Op Procedure(s) (LRB): Left reverse shoulder arthroplasty (Left) Principal Problem:   Rotator cuff arthropathy Active Problems:   CAD (coronary artery disease)   Primary myelofibrosis (H[facility])   Tobacco abuse   Depression   Focal seizure ([facility])   Thrombocytopenia ([facility])   Anemia in ESRD (end-stage renal disease) (HCC)   Hyperkalemia   Leukocytosis   Status post shoulder replacement   ESRD on peritoneal dialysis [facility])   Urinary retention   Acute CVA (cerebrovascular accident) ([place])   Anemia  of chronic disease  Estimated body mass index is 26.66 kg/m as calculated from the following:   Height as of this encounter: 5' 6.34" (1.685 m).   Weight as of this encounter: 75.7 kg. Advance diet Up with therapy /OT Left UE, gentle ROM.   Labs stable Pain well controlled Appreciate Hospitalist assistance.  Appreciate Neurology assessment.   Follow up with Dr. Posey Pronto 2 weeks post op CM to assist with discharge     DVT Prophylaxis - Aspirin, Plavix   Duanne Guess, PA-C Orthopaedic Surgery 11/24/2020, 8:57 AM

## 2020-11-25 DIAGNOSIS — N186 End stage renal disease: Secondary | ICD-10-CM | POA: Diagnosis not present

## 2020-11-25 DIAGNOSIS — Z992 Dependence on renal dialysis: Secondary | ICD-10-CM | POA: Diagnosis not present

## 2020-11-26 ENCOUNTER — Inpatient Hospital Stay: Payer: Medicare HMO

## 2020-11-26 ENCOUNTER — Telehealth: Payer: Self-pay | Admitting: *Deleted

## 2020-11-26 ENCOUNTER — Inpatient Hospital Stay: Payer: Medicare HMO | Admitting: Internal Medicine

## 2020-11-26 ENCOUNTER — Other Ambulatory Visit: Payer: Self-pay | Admitting: *Deleted

## 2020-11-26 DIAGNOSIS — Z992 Dependence on renal dialysis: Secondary | ICD-10-CM | POA: Diagnosis not present

## 2020-11-26 DIAGNOSIS — N186 End stage renal disease: Secondary | ICD-10-CM | POA: Diagnosis not present

## 2020-11-26 DIAGNOSIS — D471 Chronic myeloproliferative disease: Secondary | ICD-10-CM

## 2020-11-26 NOTE — Telephone Encounter (Signed)
Patient no showed to the apt today with Dr. Rogue Bussing. Patient d/c on 11/24/20 from hospital. Pt unable to keep apts today with Dr. Jacinto Reap. Wife stated that she left vm to r/s this apt today with Dr. Jacinto Reap. Wife states that patient had a poor outcome after surgery. Stated that he had a seizure and stroke after his surgery. He is struggling to get around. Stated that they didn't get the narcotic prescriptions from surgery after being d/c from the hospital. I asked her to contact the surgeon's office.  apts r/s for 3/11 at 11 am- I asked patient's wife to have patient keep this apt if at all possible. She said she would do her best to do so. Her daughter is going to have a baby this week. She does not know how the deliver would impact her being able to get to the patient to the apt.  She thanked me for calling her.

## 2020-11-27 DIAGNOSIS — N186 End stage renal disease: Secondary | ICD-10-CM | POA: Diagnosis not present

## 2020-11-27 DIAGNOSIS — Z992 Dependence on renal dialysis: Secondary | ICD-10-CM | POA: Diagnosis not present

## 2020-11-28 DIAGNOSIS — N186 End stage renal disease: Secondary | ICD-10-CM | POA: Diagnosis not present

## 2020-11-28 DIAGNOSIS — Z992 Dependence on renal dialysis: Secondary | ICD-10-CM | POA: Diagnosis not present

## 2020-11-29 ENCOUNTER — Encounter: Payer: Self-pay | Admitting: Internal Medicine

## 2020-11-29 ENCOUNTER — Other Ambulatory Visit: Payer: Self-pay

## 2020-11-29 ENCOUNTER — Telehealth: Payer: Self-pay | Admitting: *Deleted

## 2020-11-29 DIAGNOSIS — N186 End stage renal disease: Secondary | ICD-10-CM | POA: Diagnosis not present

## 2020-11-29 DIAGNOSIS — Z992 Dependence on renal dialysis: Secondary | ICD-10-CM | POA: Diagnosis not present

## 2020-11-29 NOTE — Telephone Encounter (Signed)
Patient's wife called. She wanted to know if patient was to receive IV iron tomorrow. Dialysis called her earlier today and also wanted to see him in the dialysis office. If he needs IV iron, he could potentially receive the IV iron at Dialysis. I explained to her that at this time, pt is not on the schedule for any IV iron. I would encourage him to keep the apt tomorrow if possible to follow-up on the myelofibrosis.

## 2020-11-30 ENCOUNTER — Encounter: Payer: Self-pay | Admitting: Internal Medicine

## 2020-11-30 ENCOUNTER — Inpatient Hospital Stay: Payer: Medicare HMO | Attending: Internal Medicine

## 2020-11-30 ENCOUNTER — Inpatient Hospital Stay (HOSPITAL_BASED_OUTPATIENT_CLINIC_OR_DEPARTMENT_OTHER): Payer: Medicare HMO | Admitting: Internal Medicine

## 2020-11-30 VITALS — BP 112/57 | HR 65 | Temp 97.9°F | Resp 16 | Ht 66.0 in | Wt 146.0 lb

## 2020-11-30 DIAGNOSIS — Z87898 Personal history of other specified conditions: Secondary | ICD-10-CM

## 2020-11-30 DIAGNOSIS — D649 Anemia, unspecified: Secondary | ICD-10-CM | POA: Insufficient documentation

## 2020-11-30 DIAGNOSIS — D471 Chronic myeloproliferative disease: Secondary | ICD-10-CM

## 2020-11-30 DIAGNOSIS — I120 Hypertensive chronic kidney disease with stage 5 chronic kidney disease or end stage renal disease: Secondary | ICD-10-CM | POA: Diagnosis not present

## 2020-11-30 DIAGNOSIS — I252 Old myocardial infarction: Secondary | ICD-10-CM | POA: Insufficient documentation

## 2020-11-30 DIAGNOSIS — N186 End stage renal disease: Secondary | ICD-10-CM | POA: Insufficient documentation

## 2020-11-30 DIAGNOSIS — R0600 Dyspnea, unspecified: Secondary | ICD-10-CM

## 2020-11-30 DIAGNOSIS — I2510 Atherosclerotic heart disease of native coronary artery without angina pectoris: Secondary | ICD-10-CM | POA: Insufficient documentation

## 2020-11-30 DIAGNOSIS — F17210 Nicotine dependence, cigarettes, uncomplicated: Secondary | ICD-10-CM | POA: Insufficient documentation

## 2020-11-30 DIAGNOSIS — R630 Anorexia: Secondary | ICD-10-CM | POA: Insufficient documentation

## 2020-11-30 DIAGNOSIS — Z79899 Other long term (current) drug therapy: Secondary | ICD-10-CM | POA: Insufficient documentation

## 2020-11-30 DIAGNOSIS — R634 Abnormal weight loss: Secondary | ICD-10-CM | POA: Diagnosis not present

## 2020-11-30 DIAGNOSIS — R531 Weakness: Secondary | ICD-10-CM | POA: Diagnosis not present

## 2020-11-30 DIAGNOSIS — Z7982 Long term (current) use of aspirin: Secondary | ICD-10-CM | POA: Insufficient documentation

## 2020-11-30 DIAGNOSIS — Z992 Dependence on renal dialysis: Secondary | ICD-10-CM | POA: Insufficient documentation

## 2020-11-30 DIAGNOSIS — R296 Repeated falls: Secondary | ICD-10-CM | POA: Diagnosis not present

## 2020-11-30 DIAGNOSIS — J449 Chronic obstructive pulmonary disease, unspecified: Secondary | ICD-10-CM | POA: Diagnosis not present

## 2020-11-30 DIAGNOSIS — R0609 Other forms of dyspnea: Secondary | ICD-10-CM

## 2020-11-30 DIAGNOSIS — E785 Hyperlipidemia, unspecified: Secondary | ICD-10-CM | POA: Diagnosis not present

## 2020-11-30 LAB — COMPREHENSIVE METABOLIC PANEL
ALT: 9 U/L (ref 0–44)
AST: 13 U/L — ABNORMAL LOW (ref 15–41)
Albumin: 2.8 g/dL — ABNORMAL LOW (ref 3.5–5.0)
Alkaline Phosphatase: 64 U/L (ref 38–126)
Anion gap: 12 (ref 5–15)
BUN: 44 mg/dL — ABNORMAL HIGH (ref 8–23)
CO2: 24 mmol/L (ref 22–32)
Calcium: 8.1 mg/dL — ABNORMAL LOW (ref 8.9–10.3)
Chloride: 101 mmol/L (ref 98–111)
Creatinine, Ser: 6.36 mg/dL — ABNORMAL HIGH (ref 0.61–1.24)
GFR, Estimated: 9 mL/min — ABNORMAL LOW (ref >60)
Glucose, Bld: 111 mg/dL — ABNORMAL HIGH (ref 70–99)
Potassium: 4 mmol/L (ref 3.5–5.1)
Sodium: 137 mmol/L (ref 135–145)
Total Bilirubin: 0.7 mg/dL (ref 0.3–1.2)
Total Protein: 5.8 g/dL — ABNORMAL LOW (ref 6.5–8.1)

## 2020-11-30 LAB — CBC WITH DIFFERENTIAL/PLATELET
Abs Immature Granulocytes: 0.03 10*3/uL (ref 0.00–0.07)
Basophils Absolute: 0 10*3/uL (ref 0.0–0.1)
Basophils Relative: 0 %
Eosinophils Absolute: 0 10*3/uL (ref 0.0–0.5)
Eosinophils Relative: 0 %
HCT: 23.6 % — ABNORMAL LOW (ref 39.0–52.0)
Hemoglobin: 7.6 g/dL — ABNORMAL LOW (ref 13.0–17.0)
Immature Granulocytes: 1 %
Lymphocytes Relative: 6 %
Lymphs Abs: 0.3 10*3/uL — ABNORMAL LOW (ref 0.7–4.0)
MCH: 29.5 pg (ref 26.0–34.0)
MCHC: 32.2 g/dL (ref 30.0–36.0)
MCV: 91.5 fL (ref 80.0–100.0)
Monocytes Absolute: 0.2 10*3/uL (ref 0.1–1.0)
Monocytes Relative: 4 %
Neutro Abs: 4.2 10*3/uL (ref 1.7–7.7)
Neutrophils Relative %: 89 %
Platelets: 83 10*3/uL — ABNORMAL LOW (ref 150–400)
RBC: 2.58 MIL/uL — ABNORMAL LOW (ref 4.22–5.81)
RDW: 15.2 % (ref 11.5–15.5)
WBC: 4.6 10*3/uL (ref 4.0–10.5)
nRBC: 0 % (ref 0.0–0.2)

## 2020-11-30 LAB — SAMPLE TO BLOOD BANK

## 2020-11-30 LAB — LACTATE DEHYDROGENASE: LDH: 407 U/L — ABNORMAL HIGH (ref 98–192)

## 2020-11-30 NOTE — Progress Notes (Signed)
[facility] OFFICE PROGRESS NOTE  Patient Care Team: James Roys, DO as PCP - General (Family Medicine) James Arnold, James Gave, MD as PCP - Cardiology (Cardiology) James Sickle, MD as Consulting Physician (Internal Medicine)  Cancer Staging No matching staging information was found for the patient.   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   # AUGUST 1st week 2019- Retacrit   # October 2nd 2019- jakafi 10 BID [? 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 [name]- kidney Bx- FSG [ on Prednisone Dr.[name]]; July 2020-peritoneal dialysis;   # hx of Lung nodules- resolved [[name]]/quit smoking.  DIAGNOSIS: PRIMARY MYELOFIBROSIS  RISK:LOW     ;GOALS: Control  CURRENT/MOST RECENT THERAPY : Surveillance    Primary myelofibrosis (James Arnold)      INTERVAL HISTORY:  James Arnold 65 y.o.  male pleasant patient above history of primary myelofibrosis Jak 2+ currently OFF Jakafi [since July 2021sec to intol]; chronic kidney disease on peritoneal dialysis is here for follow-up.  In the interim patient had shoulder surgery-which was unfortunately complicated by a seizure/acute stroke.  Patient has residual deficits from the stroke-left upper extremity weakness.  Is currently wheelchair.  He has not had any seizures since being home.  Patient is getting peritoneal dialysis; however is on erythropoietin stimulating agents as per nephrology.  Patient continues to have poor appetite.  Positive for weight loss.  Shortness of  breath on exertion.  Review of Systems  Constitutional: Positive for malaise/fatigue and weight loss. Negative for chills, diaphoresis and fever.  HENT: Negative for nosebleeds and sore throat.   Eyes: Negative for double vision.  Respiratory: Positive for cough and shortness of breath. Negative for hemoptysis, sputum production and wheezing.   Cardiovascular: Negative for chest pain, palpitations and orthopnea.  Gastrointestinal: Negative for abdominal pain, blood in stool, constipation, diarrhea, heartburn, melena, nausea and vomiting.  Genitourinary: Negative for dysuria, frequency and urgency.  Musculoskeletal: Positive for back pain and joint pain.  Skin: Negative.  Negative for itching and rash.  Neurological: Negative for dizziness, tingling, focal weakness, weakness and headaches.  Endo/Heme/Allergies: Does not bruise/bleed easily.  Psychiatric/Behavioral: Negative for depression. The patient is not nervous/anxious and does not have insomnia.       PAST MEDICAL HISTORY :  Past Medical History:  Diagnosis Date  . Anemia   . Benign hypertensive renal disease   . CAD (coronary artery disease)    a. 08/1996 s/p BMS to the mLAD ([facility]); b. 12/2017 MV: EF 46%, fixed apical defect w/ significant GI uptake artifact. No ischemia-> low risk.  . CKD (chronic kidney disease), stage V ([place])    on peritoneal dialysis  . Complication of anesthesia    please call by "[name]" when waking up!!  . COPD (chronic obstructive pulmonary disease) ([place])    centrilobular emphysema. pt is poorly controlled with his copd/smoking.  . Depression   . Diastolic dysfunction    a. 12/2017 Echo: EF 60-65%, no rwma, Gr1 DD, mild AI/MR. Nl RVSP.  [name] Kitchen Dyspnea    with exertion  . Fatigue   . FSGS (focal segmental glomerulosclerosis)   . Hyperlipidemia 10/2002  . Hypertension   .  myelofibrosis    bone marrow failure  . Myelofibrosis ([place]) 2010   JAK2 (+) primary  . Myocardial infarction ([place]) 1997   stent x  1  . Smoker   . Thrombocytopenia ([place])     PAST SURGICAL HISTORY :   Past Surgical History:  Procedure Laterality Date  . ANGIOPLASTY    . BONE MARROW ASPIRATION  03/2009  . CARDIAC CATHETERIZATION  1997   stent x 1  . COLONOSCOPY WITH PROPOFOL N/A 08/09/2018   Procedure: COLONOSCOPY WITH PROPOFOL;  Surgeon: [name], MD;  Location[facility] ENDOSCOPY;  Service: Gastroenterology;  Laterality: N/A;  . CORONARY STENT PLACEMENT  08/1996  . EXTERIORIZATION OF A CONTINUOUS AMBULATORY PERITONEAL DIALYSIS CATHETER    . EYE SURGERY Bilateral    cats removed  . REVERSE SHOULDER ARTHROPLASTY Left 11/19/2020   Procedure: Left reverse shoulder arthroplasty;  Surgeon: Leim Fabry, MD;  Location: [facility] ORS;  Service: Orthopedics;  Laterality: Left;    FAMILY HISTORY :   Family History  Problem Relation Age of Onset  . Stroke Mother        Low BP stroke  . Depression Father   . Depression Sister        Breast  . Heart disease Other   . Breast cancer Other   . Lung cancer Other   . Ovarian cancer Other   . Stomach cancer Other     SOCIAL HISTORY:   Social History   Tobacco Use  . Smoking status: Current Every Day Smoker    Packs/day: 0.75    Years: 47.00    Pack years: 35.25    Types: Cigarettes  . Smokeless tobacco: Never Used  . Tobacco comment: trying to cut back  Vaping Use  . Vaping Use: Never used  Substance Use Topics  . Alcohol use: Not Currently    Comment: rare  . Drug use: No    ALLERGIES:  has No Known Allergies.  MEDICATIONS:  Current Outpatient Medications  Medication Sig Dispense Refill  . amLODipine (NORVASC) 10 MG tablet Take 1 tablet (10 mg total) by mouth daily. 90 tablet 1  . aspirin 81 MG chewable tablet Chew 1 tablet (81 mg total) by mouth daily. 30 tablet 0  . carvedilol (COREG) 12.5 MG tablet Take 1.5 tablets (18.75 mg total) by mouth 2 (two) times daily. 270 tablet 3  . Cholecalciferol (VITAMIN D-1000 MAX ST) 25 MCG (1000 UT) tablet Take 1,000  Units by mouth daily.     . citalopram (CELEXA) 40 MG tablet TAKE 1 TABLET EVERY DAY (Patient taking differently: Take 40 mg by mouth daily.) 90 tablet 1  . clopidogrel (PLAVIX) 75 MG tablet Take 1 tablet (75 mg total) by mouth daily for 19 days. 19 tablet 0  . epoetin alfa-epbx (RETACRIT) 56389 UNIT/ML injection Inject into the skin. Once a month at dialysis    . ferrous sulfate 325 (65 FE) MG tablet Take 1 tablet (325 mg total) by mouth daily with breakfast. 30 tablet 1  . furosemide (LASIX) 20 MG tablet Take 1 tablet (20 mg total) by mouth daily. 90 tablet 1  . gentamicin cream (GARAMYCIN) 0.1 % Apply 1 application topically every other day.    . levETIRAcetam (KEPPRA) 500 MG tablet Take 1 tablet (500 mg total) by mouth at bedtime. 30 tablet 0  . losartan (COZAAR) 50 MG tablet Take 1 tablet (50 mg total) by mouth daily. 90 tablet 1  . melatonin 5 MG TABS Take 1 tablet (5 mg  total) by mouth at bedtime as needed. 30 tablet 0  . nicotine (NICODERM CQ) 14 mg/24hr patch Place 1 patch (14 mg total) onto the skin daily. 84 patch 0  . ondansetron (ZOFRAN ODT) 8 MG disintegrating tablet Take 1 tablet (8 mg total) by mouth every 8 (eight) hours as needed for nausea or vomiting. 30 tablet 1  . oxyCODONE (OXY IR/ROXICODONE) 5 MG immediate release tablet Take 1-2 tablets (5-10 mg total) by mouth every 4 (four) hours as needed for moderate pain (pain score 4-6). 30 tablet 0  . polyethylene glycol (MIRALAX / GLYCOLAX) 17 g packet Take 17 g by mouth daily as needed. 14 each 0  . predniSONE (DELTASONE) 10 MG tablet Take 10 mg by mouth daily with breakfast.    . tamsulosin (FLOMAX) 0.4 MG CAPS capsule Take 1 capsule (0.4 mg total) by mouth daily. 30 capsule 0  . varenicline (CHANTIX CONTINUING MONTH PAK) 1 MG tablet Take 1 tablet (1 mg total) by mouth 2 (two) times daily. 120 tablet 0   No current facility-administered medications for this visit.    PHYSICAL EXAMINATION: ECOG PERFORMANCE STATUS: 1 -  Symptomatic but completely ambulatory  BP (!) 112/57 (BP Location: Left Arm, Patient Position: Sitting, Cuff Size: Normal)   Pulse 65   Temp 97.9 F (36.6 C) (Tympanic)   Resp 16   Ht _0  (1.676 m)   Wt 146 lb (66.2 kg)   SpO2 99%   BMI 23.57 kg/m   Filed Weights   11/30/20 1123  Weight: 146 lb (66.2 kg)    Physical Exam Constitutional:      Comments: Patient is in a wheelchair.  He is accompanied by his wife.  Appears cachectic.  HENT:     Head: Normocephalic and atraumatic.     Mouth/Throat:     Pharynx: No oropharyngeal exudate.  Eyes:     Pupils: Pupils are equal, round, and reactive to light.  Cardiovascular:     Rate and Rhythm: Normal rate and regular rhythm.  Pulmonary:     Effort: No respiratory distress.     Breath sounds: No wheezing.     Comments: Decreased air entry bilaterally. Abdominal:     General: Bowel sounds are normal. There is no distension.     Palpations: Abdomen is soft. There is no mass.     Tenderness: There is no abdominal tenderness. There is no guarding or rebound.  Musculoskeletal:        General: No tenderness. Normal range of motion.     Cervical back: Normal range of motion and neck supple.  Skin:    General: Skin is warm.  Neurological:     Mental Status: He is alert and oriented to person, place, and time.     Comments: Left upper extremity weakness noted.    Psychiatric:        Mood and Affect: Affect normal.     LABORATORY DATA:  I have reviewed the data as listed    Component Value Date/Time   NA 137 11/30/2020 1051   NA 136 07/10/2020 0909   K 4.0 11/30/2020 1051   CL 101 11/30/2020 1051   CO2 24 11/30/2020 1051   GLUCOSE 111 (H) 11/30/2020 1051   BUN 44 (H) 11/30/2020 1051   BUN 45 (H) 07/10/2020 0909   CREATININE 6.36 (H) 11/30/2020 1051   CREATININE 1.12 09/30/2013 0953   CALCIUM 8.1 (L) 11/30/2020 1051   PROT 5.8 (L) 11/30/2020 1051   PROT 6.0  07/10/2020 0909   ALBUMIN 2.8 (L) 11/30/2020 1051   ALBUMIN  4.1 07/10/2020 0909   AST 13 (L) 11/30/2020 1051   ALT 9 11/30/2020 1051   ALKPHOS 64 11/30/2020 1051   BILITOT 0.7 11/30/2020 1051   BILITOT 0.4 07/10/2020 0909   GFRNONAA 9 (L) 11/30/2020 1051   GFRNONAA >60 09/30/2013 0953   GFRAA 14 (L) 07/10/2020 0909   GFRAA >60 09/30/2013 0953    No results found for: SPEP, UPEP  Lab Results  Component Value Date   WBC 4.6 11/30/2020   NEUTROABS 4.2 11/30/2020   HGB 7.6 (L) 11/30/2020   HCT 23.6 (L) 11/30/2020   MCV 91.5 11/30/2020   PLT 83 (L) 11/30/2020      Chemistry      Component Value Date/Time   NA 137 11/30/2020 1051   NA 136 07/10/2020 0909   K 4.0 11/30/2020 1051   CL 101 11/30/2020 1051   CO2 24 11/30/2020 1051   BUN 44 (H) 11/30/2020 1051   BUN 45 (H) 07/10/2020 0909   CREATININE 6.36 (H) 11/30/2020 1051   CREATININE 1.12 09/30/2013 0953      Component Value Date/Time   CALCIUM 8.1 (L) 11/30/2020 1051   ALKPHOS 64 11/30/2020 1051   AST 13 (L) 11/30/2020 1051   ALT 9 11/30/2020 1051   BILITOT 0.7 11/30/2020 1051   BILITOT 0.4 07/10/2020 0909       RADIOGRAPHIC STUDIES: I have personally reviewed the radiological images as listed and agreed with the findings in the report. No results found.   ASSESSMENT & PLAN:  Primary myelofibrosis ([place]) # Primary Myelofiborisis [Bone marrow 2010] jak-2 POSITIVE; currently on surveillance. OFF Jakafi because of intolerance.  Clinically worsening [fatigue; weight loss; dyspnea- see below]  #Ultrasound February 2022-worsening splenomegaly/clinically concerning for worsening symptoms secondary myelofibrosis.  Patient reluctant to start Jeananne Rama is understandable given his multiple other comorbidities.  Will monitor closely.  Unfortunately poor prognosis especially in the context of his multiple medical problems.  #Anemia-currently on EPO stimulating agent /IV iron as per nephrology.  Discussed with Dr.[name] recommend increasing the dose/frequency of erythropoietin  stimulating agent.  Also suspect anemia will worsen on Jakafi.  #Stroke/left upper extremity weakness/seizures- [s/p shoulder surgery]-on antiplatelet therapy.  #COPD-poorly controlled; declines PFTs; recommend continue prednisone 10 mg a day.  #CKD stage IV-V [Dr. Latif]  on PD;STABLE.   # COVID vaccine: Patient is to unvaccinated to Covid-19. Wife/patient reluctant.  #Given significant debility/recent stroke-recommend home health.  # DISPOSITION: home health # follow up in 1 months-labs- MD-CBC/CMP;LDH;HOLD tube; posssible1 unit PRBC tranfusion--Dr.B Cc; Dr.[name] /Dr.Johnson   Orders Placed This Encounter  Procedures  . CBC with Differential/Platelet    Standing Status:   Future    Standing Expiration Date:   11/30/2021  . Comprehensive metabolic panel    Standing Status:   Future    Standing Expiration Date:   11/30/2021  . Lactate dehydrogenase    Standing Status:   Future    Standing Expiration Date:   11/30/2021  . Ambulatory referral to Home Health    Referral Priority:   Routine    Referral Type:   Home Health Care    Referral Reason:   Specialty Services Required    Requested Specialty:   [place]    Number of Visits Requested:   1  . Hold Tube- Blood Bank    Standing Status:   Future    Standing Expiration Date:   11/30/2021   All questions  were answered. The patient knows to call the clinic with any problems, questions or concerns.     James Sickle, MD 12/05/2020 9:42 PM

## 2020-11-30 NOTE — Assessment & Plan Note (Addendum)
#   Primary Myelofiborisis [Bone marrow 2010] jak-2 POSITIVE; currently on surveillance. OFF Jakafi because of intolerance.  Clinically worsening [fatigue; weight loss; dyspnea- see below]  #Ultrasound February 2022-worsening splenomegaly/clinically concerning for worsening symptoms secondary myelofibrosis.  Patient reluctant to start Jeananne Rama is understandable given his multiple other comorbidities.  Will monitor closely.  Unfortunately poor prognosis especially in the context of his multiple medical problems.  #Anemia-currently on EPO stimulating agent /IV iron as per nephrology.  Discussed with Dr.[name] recommend increasing the dose/frequency of erythropoietin stimulating agent.  Also suspect anemia will worsen on Jakafi.  #Stroke/left upper extremity weakness/seizures- [s/p shoulder surgery]-on antiplatelet therapy.  #COPD-poorly controlled; declines PFTs; recommend continue prednisone 10 mg a day.  #CKD stage IV-V [Dr. Latif]  on PD;STABLE.   # COVID vaccine: Patient is to unvaccinated to Covid-19. Wife/patient reluctant.  #Given significant debility/recent stroke-recommend home health.  # DISPOSITION: home health # follow up in 1 months-labs- MD-CBC/CMP;LDH;HOLD tube; posssible1 unit PRBC tranfusion--Dr.B Cc; Dr.[name] /Dr.Johnson

## 2020-11-30 NOTE — Progress Notes (Signed)
Has shoulder surgery on 2/28 and the procedure did not go well. Was recently anti seizure medication. Had 2 strokes and seizures during the procedure.

## 2020-12-02 DIAGNOSIS — Z992 Dependence on renal dialysis: Secondary | ICD-10-CM | POA: Diagnosis not present

## 2020-12-02 DIAGNOSIS — N186 End stage renal disease: Secondary | ICD-10-CM | POA: Diagnosis not present

## 2020-12-03 ENCOUNTER — Other Ambulatory Visit: Payer: Self-pay | Admitting: Nurse Practitioner

## 2020-12-03 ENCOUNTER — Telehealth: Payer: Self-pay | Admitting: Family Medicine

## 2020-12-03 DIAGNOSIS — N186 End stage renal disease: Secondary | ICD-10-CM | POA: Diagnosis not present

## 2020-12-03 DIAGNOSIS — Z992 Dependence on renal dialysis: Secondary | ICD-10-CM | POA: Diagnosis not present

## 2020-12-03 MED ORDER — VARENICLINE TARTRATE 1 MG PO TABS: 1.0000 mg | ORAL_TABLET | Freq: Two times a day (BID) | ORAL | 0 refills | Status: DC

## 2020-12-03 NOTE — Telephone Encounter (Signed)
Patient aware.

## 2020-12-03 NOTE — Telephone Encounter (Signed)
Sent new rx in

## 2020-12-03 NOTE — Telephone Encounter (Signed)
Medication: varenicline (chantix) will need new Rx as first Rx was starter pack Has the pt contacted their pharmacy? yes Preferred pharmacy:  [facility], [address]    Please be advised refills may take up to 3 business days.  We ask that you follow up with your pharmacy.  Wife wants the dr to know pt has not smoked at all since he started this med,  and she does not want him to smoke so please send in!

## 2020-12-04 DIAGNOSIS — Z992 Dependence on renal dialysis: Secondary | ICD-10-CM | POA: Diagnosis not present

## 2020-12-04 DIAGNOSIS — N186 End stage renal disease: Secondary | ICD-10-CM | POA: Diagnosis not present

## 2020-12-05 DIAGNOSIS — Z992 Dependence on renal dialysis: Secondary | ICD-10-CM | POA: Diagnosis not present

## 2020-12-05 DIAGNOSIS — Z96612 Presence of left artificial shoulder joint: Secondary | ICD-10-CM | POA: Diagnosis not present

## 2020-12-05 DIAGNOSIS — N186 End stage renal disease: Secondary | ICD-10-CM | POA: Diagnosis not present

## 2020-12-06 DIAGNOSIS — I2510 Atherosclerotic heart disease of native coronary artery without angina pectoris: Secondary | ICD-10-CM | POA: Diagnosis not present

## 2020-12-06 DIAGNOSIS — Z992 Dependence on renal dialysis: Secondary | ICD-10-CM | POA: Diagnosis not present

## 2020-12-06 DIAGNOSIS — Z471 Aftercare following joint replacement surgery: Secondary | ICD-10-CM | POA: Diagnosis not present

## 2020-12-06 DIAGNOSIS — R1310 Dysphagia, unspecified: Secondary | ICD-10-CM | POA: Diagnosis not present

## 2020-12-06 DIAGNOSIS — E785 Hyperlipidemia, unspecified: Secondary | ICD-10-CM | POA: Diagnosis not present

## 2020-12-06 DIAGNOSIS — M109 Gout, unspecified: Secondary | ICD-10-CM | POA: Diagnosis not present

## 2020-12-06 DIAGNOSIS — D631 Anemia in chronic kidney disease: Secondary | ICD-10-CM | POA: Diagnosis not present

## 2020-12-06 DIAGNOSIS — N186 End stage renal disease: Secondary | ICD-10-CM | POA: Diagnosis not present

## 2020-12-06 DIAGNOSIS — I1311 Hypertensive heart and chronic kidney disease without heart failure, with stage 5 chronic kidney disease, or end stage renal disease: Secondary | ICD-10-CM | POA: Diagnosis not present

## 2020-12-06 DIAGNOSIS — N185 Chronic kidney disease, stage 5: Secondary | ICD-10-CM | POA: Diagnosis not present

## 2020-12-06 DIAGNOSIS — J449 Chronic obstructive pulmonary disease, unspecified: Secondary | ICD-10-CM | POA: Diagnosis not present

## 2020-12-07 ENCOUNTER — Ambulatory Visit: Payer: Self-pay | Admitting: *Deleted

## 2020-12-07 DIAGNOSIS — Z992 Dependence on renal dialysis: Secondary | ICD-10-CM | POA: Diagnosis not present

## 2020-12-07 DIAGNOSIS — N186 End stage renal disease: Secondary | ICD-10-CM | POA: Diagnosis not present

## 2020-12-07 NOTE — Telephone Encounter (Signed)
He can stop Chantix at this time.  No need to taper as completed starter pack.  I do recommend with his current symptoms of weakness, decreased appetite, and significant weight loss he be seen in ER today though.  Concern for dehydration with his current situation.

## 2020-12-07 NOTE — Telephone Encounter (Signed)
Per inittial encounter, " Wife wants to know how pt is to wean off Chantix Pt has not smoked since 11/18/2020. She states pt is so weak and nauseous he has not even thought about a cigarette. She wants to know if OK to just stop it, or does it need to be tapered?"; contacted pt's wife to discuss his symptoms; she says the pt is too nauseous to eat and has lost 30 pounds over the last few months; he was to have been prescribed medication for nausea before he left the hospital but has not yet received it; the pt urinates normally; he does home dialysis daily except Saturdays for 8-9 hours; the pt is sleeping more; the pt's wife says he is to have home health but no one has come out to help her; given the pt's symptoms she was advised to consider taking the pt to the ED; she verbalized understanding and says the pt is not willing to go back to the hospital; explained this information would be sent for provider review and she will be called back; she verbalized understanding and can be contacted at [phone]; the pt is seen by Marnee Guarneri, [facility]; will route to office for provider review.  Reason for Disposition . [1] Caller has URGENT medicine question about med that PCP or specialist prescribed AND [2] triager unable to answer question  Answer Assessment - Initial Assessment Questions 1. NAME of MEDICATION: "What medicine are you calling about?"   chantix 2. QUESTION: "What is your question?" (e.g., medication refill, side effect)    How to stop chantix 3. PRESCRIBING HCP: "Who prescribed it?" Reason: if prescribed by specialist, call should be referred to that group.    Jolene Cannady 4. SYMPTOMS: "Do you have any symptoms?"     Vomited 3 times in the past 24 hours 5. SEVERITY: If symptoms are present, ask "Are they mild, moderate or severe?"     Severe, can't eat due to nausea; he is down to 148 lbs; lost 30 in the past 2-3 months;  6. PREGNANCY:  "Is there any chance that you are  pregnant?" "When was your last menstrual period?"   n/a  Protocols used: MEDICATION QUESTION CALL-A-AH

## 2020-12-07 NOTE — Telephone Encounter (Signed)
Please send to Dr. Wynetta Emery.

## 2020-12-07 NOTE — Telephone Encounter (Signed)
Can we see if any openings next week for Dr. Wynetta Emery for him to be seen.

## 2020-12-07 NOTE — Telephone Encounter (Signed)
Sent to incorrect practice in error; the pt is seen at [facility]; will route to correct office.

## 2020-12-07 NOTE — Telephone Encounter (Signed)
Called [name] pt's wife to schedule, she states that pt has appts all next week she will see how he does and if he gets worse she will make him go somewhere to be seen

## 2020-12-07 NOTE — Telephone Encounter (Signed)
Patient's wife states that he will not go to the ER, since it is the hospitals fault he is the way he is.

## 2020-12-09 DIAGNOSIS — N186 End stage renal disease: Secondary | ICD-10-CM | POA: Diagnosis not present

## 2020-12-09 DIAGNOSIS — Z992 Dependence on renal dialysis: Secondary | ICD-10-CM | POA: Diagnosis not present

## 2020-12-10 ENCOUNTER — Ambulatory Visit: Payer: Medicare HMO

## 2020-12-10 DIAGNOSIS — Z992 Dependence on renal dialysis: Secondary | ICD-10-CM | POA: Diagnosis not present

## 2020-12-10 DIAGNOSIS — N186 End stage renal disease: Secondary | ICD-10-CM | POA: Diagnosis not present

## 2020-12-10 DIAGNOSIS — I69369 Other paralytic syndrome following cerebral infarction affecting unspecified side: Secondary | ICD-10-CM | POA: Diagnosis not present

## 2020-12-10 DIAGNOSIS — R569 Unspecified convulsions: Secondary | ICD-10-CM | POA: Diagnosis not present

## 2020-12-11 ENCOUNTER — Ambulatory Visit: Payer: Medicare HMO | Admitting: Family Medicine

## 2020-12-11 DIAGNOSIS — Z471 Aftercare following joint replacement surgery: Secondary | ICD-10-CM | POA: Diagnosis not present

## 2020-12-11 DIAGNOSIS — D631 Anemia in chronic kidney disease: Secondary | ICD-10-CM | POA: Diagnosis not present

## 2020-12-11 DIAGNOSIS — E785 Hyperlipidemia, unspecified: Secondary | ICD-10-CM | POA: Diagnosis not present

## 2020-12-11 DIAGNOSIS — I1311 Hypertensive heart and chronic kidney disease without heart failure, with stage 5 chronic kidney disease, or end stage renal disease: Secondary | ICD-10-CM | POA: Diagnosis not present

## 2020-12-11 DIAGNOSIS — I2510 Atherosclerotic heart disease of native coronary artery without angina pectoris: Secondary | ICD-10-CM | POA: Diagnosis not present

## 2020-12-11 DIAGNOSIS — N186 End stage renal disease: Secondary | ICD-10-CM | POA: Diagnosis not present

## 2020-12-11 DIAGNOSIS — J449 Chronic obstructive pulmonary disease, unspecified: Secondary | ICD-10-CM | POA: Diagnosis not present

## 2020-12-11 DIAGNOSIS — N185 Chronic kidney disease, stage 5: Secondary | ICD-10-CM | POA: Diagnosis not present

## 2020-12-11 DIAGNOSIS — Z992 Dependence on renal dialysis: Secondary | ICD-10-CM | POA: Diagnosis not present

## 2020-12-11 DIAGNOSIS — M109 Gout, unspecified: Secondary | ICD-10-CM | POA: Diagnosis not present

## 2020-12-11 DIAGNOSIS — R1310 Dysphagia, unspecified: Secondary | ICD-10-CM | POA: Diagnosis not present

## 2020-12-12 ENCOUNTER — Ambulatory Visit: Payer: Medicare HMO | Admitting: Internal Medicine

## 2020-12-12 ENCOUNTER — Ambulatory Visit (INDEPENDENT_AMBULATORY_CARE_PROVIDER_SITE_OTHER): Payer: Medicare HMO

## 2020-12-12 ENCOUNTER — Encounter: Payer: Self-pay | Admitting: Family

## 2020-12-12 ENCOUNTER — Ambulatory Visit (INDEPENDENT_AMBULATORY_CARE_PROVIDER_SITE_OTHER): Payer: Medicare HMO | Admitting: Family

## 2020-12-12 ENCOUNTER — Other Ambulatory Visit: Payer: Medicare HMO

## 2020-12-12 ENCOUNTER — Other Ambulatory Visit: Payer: Self-pay

## 2020-12-12 VITALS — BP 132/60 | HR 66 | Ht 67.0 in | Wt 150.0 lb

## 2020-12-12 DIAGNOSIS — N186 End stage renal disease: Secondary | ICD-10-CM | POA: Diagnosis not present

## 2020-12-12 DIAGNOSIS — D631 Anemia in chronic kidney disease: Secondary | ICD-10-CM | POA: Diagnosis not present

## 2020-12-12 DIAGNOSIS — E785 Hyperlipidemia, unspecified: Secondary | ICD-10-CM | POA: Diagnosis not present

## 2020-12-12 DIAGNOSIS — Z720 Tobacco use: Secondary | ICD-10-CM | POA: Diagnosis not present

## 2020-12-12 DIAGNOSIS — J449 Chronic obstructive pulmonary disease, unspecified: Secondary | ICD-10-CM | POA: Diagnosis not present

## 2020-12-12 DIAGNOSIS — M109 Gout, unspecified: Secondary | ICD-10-CM | POA: Diagnosis not present

## 2020-12-12 DIAGNOSIS — Z992 Dependence on renal dialysis: Secondary | ICD-10-CM | POA: Diagnosis not present

## 2020-12-12 DIAGNOSIS — I471 Supraventricular tachycardia: Secondary | ICD-10-CM

## 2020-12-12 DIAGNOSIS — I700 Atherosclerosis of aorta: Secondary | ICD-10-CM | POA: Insufficient documentation

## 2020-12-12 DIAGNOSIS — I2510 Atherosclerotic heart disease of native coronary artery without angina pectoris: Secondary | ICD-10-CM | POA: Diagnosis not present

## 2020-12-12 DIAGNOSIS — Z471 Aftercare following joint replacement surgery: Secondary | ICD-10-CM | POA: Diagnosis not present

## 2020-12-12 DIAGNOSIS — I25118 Atherosclerotic heart disease of native coronary artery with other forms of angina pectoris: Secondary | ICD-10-CM | POA: Diagnosis not present

## 2020-12-12 DIAGNOSIS — R1310 Dysphagia, unspecified: Secondary | ICD-10-CM | POA: Diagnosis not present

## 2020-12-12 DIAGNOSIS — I1311 Hypertensive heart and chronic kidney disease without heart failure, with stage 5 chronic kidney disease, or end stage renal disease: Secondary | ICD-10-CM | POA: Diagnosis not present

## 2020-12-12 DIAGNOSIS — R569 Unspecified convulsions: Secondary | ICD-10-CM | POA: Diagnosis not present

## 2020-12-12 DIAGNOSIS — Z8673 Personal history of transient ischemic attack (TIA), and cerebral infarction without residual deficits: Secondary | ICD-10-CM | POA: Diagnosis not present

## 2020-12-12 DIAGNOSIS — I10 Essential (primary) hypertension: Secondary | ICD-10-CM

## 2020-12-12 DIAGNOSIS — N185 Chronic kidney disease, stage 5: Secondary | ICD-10-CM | POA: Diagnosis not present

## 2020-12-12 NOTE — Progress Notes (Signed)
Office Visit    Patient Name: James Arnold Date of Encounter: 12/12/2020  Primary Care Provider:  Valerie Roys, DO Primary Cardiologist:  Nelva Bush, MD Electrophysiologist:  None   Chief Complaint    James Arnold is a 65 y.o. 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 Anemia   . Benign hypertensive renal disease   . CAD (coronary artery disease)    a. 08/1996 s/p BMS to the mLAD ([facility]); b. 12/2017 MV: EF 46%, fixed apical defect w/ significant GI uptake artifact. No ischemia-> low risk.  . CKD (chronic kidney disease), stage V ([place])    on peritoneal dialysis  . Complication of anesthesia    please call by "[name]" when waking up!!  . COPD (chronic obstructive pulmonary disease) ([place])    centrilobular emphysema. pt is poorly controlled with his copd/smoking.  . Depression   . Diastolic dysfunction    a. 12/2017 Echo: EF 60-65%, no rwma, Gr1 DD, mild AI/MR. Nl RVSP.  [name] Kitchen Dyspnea    with exertion  . Fatigue   . FSGS (focal segmental glomerulosclerosis)   . Hyperlipidemia 10/2002  . Hypertension   . myelofibrosis    bone marrow failure  . Myelofibrosis ([place]) 2010   JAK2 (+) primary  . Myocardial infarction ([place]) 1997   stent x 1  . Smoker   . Thrombocytopenia ([place])    Past Surgical History:  Procedure Laterality Date  . ANGIOPLASTY    . BONE MARROW ASPIRATION  03/2009  . CARDIAC CATHETERIZATION  1997   stent x 1  . COLONOSCOPY WITH PROPOFOL N/A 08/09/2018   Procedure: COLONOSCOPY WITH PROPOFOL;  Surgeon: [name], MD;  Location: [facility] ENDOSCOPY;  Service: Gastroenterology;  Laterality: N/A;  . CORONARY STENT PLACEMENT  08/1996  . EXTERIORIZATION OF A CONTINUOUS AMBULATORY PERITONEAL DIALYSIS CATHETER    . EYE SURGERY  Bilateral    cats removed  . REVERSE SHOULDER ARTHROPLASTY Left 11/19/2020   Procedure: Left reverse shoulder arthroplasty;  Surgeon: Leim Fabry, MD;  Location: [facility] ORS;  Service: Orthopedics;  Laterality: Left;    Allergies  No Known Allergies  History of Present Illness    James Arnold is a 65 y.o. 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  He was last seen while hospitalized  Cardiac history notable for CAD s/p bare-metal stenting of mid LAD in 1997 at [facility].  He was hospitalized in 2019 with chest pain underwent echocardiogram which showed normal LV function and grade 1 diastolic dysfunction.  Stress test was low risk/nonischemic.  He was seen in follow-up May 2019 with no recurrent anginal symptoms.  His carvedilol was increased to 12.5 mg twice daily due to elevated blood pressure.  He was then lost to follow up until 08/15/20.   He was in the emergency department at [facility] 07/26/2020 after presenting with chest pain.  Per ED MD his presentation was consistent with COPD exacerbation. HS troponin of 36 and 32. His symptoms improved with breathing treatments and steroids. Lexiscan Myoview 08/21/20 was low risk with no evidence of ischemia. Echo 09/07/20 LVEF 60-65%, gr1DD, RV normal size and pressure, mild to moderate MR.  Underwent left rotator cuff arthroplasty 11/19/20 with new onset seizure activity 11/20/20. Hospitalist and neurology were consulted. Head CT with remote right frontal and parietal cortical infarcts.   He had new onset seizure activity, Keppra started. EED unremarkable. Noted to be on varenicline (chantix). MRI brain with acute/early subaute cortical infarcts within the right frontal, parietal, and occipital lobes. Recommended for 3 week course of Plavix by neurology. Echo bubble EF 50-55%, gr1DD, RV normal size and function, no evidence  interatrial shunt. Carotid duplex with bilateral ICA stenoses <50%. Seen in consult by Dr. Rockey Situ with plan to pursue TEE and Linq as an outpatient as uncertain whether TEE would be able to be performed in hospital given shoulder mobility and healing. He and his wife declined statin on discharge as stated wife previously had kidney issues while taking.  Seen by Dr. Manuella Ghazi of neurology 12/11/19. Recommended for prolonged EEG.   Presents today for follow up with his wife. They are insistent on completing appointment quickly as he is in pain. Since discharge reports shoulder pain improving though more sore today. No chest pain, shortness of breath, palpitations, lightheadedness. Wife is understandably frustrated by multiple medical appointments and uncertain whether she wants any additional procedures performed at [facility] Initiated discussion regarding Linq device which could be placed in clinic with EP, hesitant regarding seeing another specialist. Initiated discussion regarding TEE though hesitant regarding additional procedures at this time.   EKGs/Labs/Other Studies Reviewed:   The following studies were reviewed today: Echo 09/07/20  1. Left ventricular ejection fraction, by estimation, is 60 to 65%. The  left ventricle has normal function. The left ventricle has no regional  wall motion abnormalities. Left ventricular diastolic parameters are  consistent with Grade I diastolic  dysfunction (impaired relaxation). The average left ventricular global  longitudinal strain is -15.8 %.   2. Right ventricular systolic function is normal. The right ventricular  size is normal. There is normal pulmonary artery systolic pressure. The  estimated right ventricular systolic pressure is 28.3 mmHg.   3. Mild to moderate mitral valve regurgitation.   Echo 12/2017 Left ventricle: The cavity size was normal. There was mild    concentric hypertrophy. Systolic function was normal. The    estimated ejection fraction  was in the range of 60% to 65%. Wall    motion was normal; there were no regional wall motion    abnormalities. Doppler parameters are consistent with abnormal    left ventricular relaxation (grade 1 diastolic dysfunction).  - Aortic valve: There was mild regurgitation.  - Mitral valve: There was mild regurgitation.  - Left atrium: The atrium was normal in size.  - Right ventricle: Systolic function was normal.  - Pulmonary arteries: Systolic pressure was within the normal    range.   EKG:  NO EKG today due to shoulder surgery, sling, and significant shoulder pain. Patient declines EKG.  Recent Labs: 07/10/2020: TSH 1.900 11/20/2020: Magnesium 2.0 11/30/2020: ALT 9; BUN 44; Creatinine, Ser 6.36; Hemoglobin 7.6; Platelets 83; Potassium 4.0; Sodium 137  Recent Lipid Panel    Component Value Date/Time   CHOL 142 11/21/2020 1754   CHOL 205 (H) 07/10/2020 0909   TRIG 131 11/21/2020 1754   HDL 25 (L) 11/21/2020 1754   HDL 33 (L) 07/10/2020 0909   CHOLHDL 5.7 11/21/2020 1754   VLDL 26 11/21/2020 1754   LDLCALC 91 11/21/2020 1754   LDLCALC 140 (H) 07/10/2020 0909   Home Medications   Current Meds  Medication Sig  . aspirin  81 MG chewable tablet Chew 1 tablet (81 mg total) by mouth daily.  . carvedilol (COREG) 12.5 MG tablet Take 1.5 tablets (18.75 mg total) by mouth 2 (two) times daily.  . Cholecalciferol (VITAMIN D-1000 MAX ST) 25 MCG (1000 UT) tablet Take 1,000 Units by mouth daily.   . citalopram (CELEXA) 40 MG tablet TAKE 1 TABLET EVERY DAY  . epoetin alfa-epbx (RETACRIT) 03500 UNIT/ML injection Inject into the skin. Once a month at dialysis  . ferrous sulfate 325 (65 FE) MG tablet Take 1 tablet (325 mg total) by mouth daily with breakfast.  . furosemide (LASIX) 20 MG tablet Take 1 tablet (20 mg total) by mouth daily.  [name] Kitchen gentamicin cream (GARAMYCIN) 0.1 % Apply 1 application topically every other day.  . levETIRAcetam (KEPPRA) 500 MG tablet Take 1 tablet (500 mg total) by mouth at  bedtime.  . ondansetron (ZOFRAN ODT) 8 MG disintegrating tablet Take 1 tablet (8 mg total) by mouth every 8 (eight) hours as needed for nausea or vomiting.  . polyethylene glycol (MIRALAX / GLYCOLAX) 17 g packet Take 17 g by mouth daily as needed.  . predniSONE (DELTASONE) 10 MG tablet Take 10 mg by mouth daily with breakfast.  . tamsulosin (FLOMAX) 0.4 MG CAPS capsule Take 1 capsule (0.4 mg total) by mouth daily.    Review of Systems    All other systems reviewed and are otherwise negative except as noted above.  Physical Exam    VS:  BP 132/60 (BP Location: Right Arm, Patient Position: Sitting, Cuff Size: Normal)   Pulse 66   Ht 5\' 7"  (1.702 m)   Wt 150 lb (68 kg)   SpO2 97%   BMI 23.49 kg/m  , BMI Body mass index is 23.49 kg/m.  Wt Readings from Last 3 Encounters:  12/12/20 150 lb (68 kg)  11/30/20 146 lb (66.2 kg)  11/24/20 166 lb 14.4 oz (75.7 kg)    GEN: Well nourished, overweight, well developed, in no aute distress. HEENT: Normal. Neck: Supple, no JVD, carotid bruits, or masses. Cardiac: RRR, no murmurs, rubs, or gallops. No clubbing, cyanosis, edema.  Radials//PT 2+ and equal bilaterally.  Respiratory:  Respirations regular and unlabored.  GI: Soft, nontender, nondistended. MS: No deformity or atrophy. L arm in sling. Skin: Warm and dry, no rash. Surgical incision at left shoulder with steri strips, edges well approximated, no signs of infection. Neuro:  Strength and sensation are intact. A&O x4. Psych: Normal affect.  Assessment & Plan    1. History of CVA - CVA noted during recent admission. Echo bubble normal LVEF and no shunt detected. Neurology has requested Linq as well as TEE. 14 day ZIO placed today as often required by insurance prior to Linq placement. Very hesitant regarding additional procedures. Discussed indication for TEE and the risks [esophageal damage, perforation (1:10,000 risk), bleeding, pharyngeal hematoma as well as other potential complications  associated with conscious sedation including aspiration, arrhythmia, respiratory failure and death], benefits (treatment guidance and diagnostic support) and alternatives of a transesophageal echocardiogram were discussed in detail with Mr. Danielski and he is unwilling to proceed at this time. Encouraged to call our office if he changes his mind. He additionally declines referral to EP for discussion regarding Linq. Very hesitant regarding procedures at [facility] after recent admission and requesting to return home as soon as possible during visit due to shoulder pain.   2. Seizure - Continue to follow with Dr. Manuella Ghazi of neurology. New diagnosis during recent admission.  3. CAD- Lexiscan myoview 07/2020 low risk study.  No anginal symptoms.  GDMT includes aspirin, beta-blocker.  Of note, he was intolerant of Imdur with headache.  Declines statin, as below.  4. Tobacco use- Not interested in quitting at this time. Smoking cessation encouraged. Recommend utilization of 1800QUITNOW.  5. HTN - BP well controlled. Continue current antihypertensive regimen.   6. HLD, LDL goal less than 70-Declines lipid-lowering therapy including statins as he shares with me his wife had complications after taking cholesterol medication.  Continue to recommend statin in setting of history of CVA and HLD. Could consider PCSK9i or Incliseron but low suspicion he would be agreeable.   7. ESRD on home peritoneal dialysis- Continue to follow with nephrology.   Disposition: Follow up in 1 month(s) with Dr. Saunders Revel only.  Signed, Loel Dubonnet, NP 12/12/2020, 10:44 AM [place]

## 2020-12-12 NOTE — Patient Instructions (Signed)
Medication Instructions:  Continue current medications.  *If you need a refill on your cardiac medications before your next appointment, please call your pharmacy*   Lab Work: None today.    Testing/Procedures: Your physician has recommended that you wear a Zio monitor. This monitor is a medical device that records the heart's electrical activity. Doctors most often use these monitors to diagnose arrhythmias. Arrhythmias are problems with the speed or rhythm of the heartbeat. The monitor is a small device applied to your chest. You can wear one while you do your normal daily activities. While wearing this monitor if you have any symptoms to push the button and record what you felt. Once you have worn this monitor for the period of time provider prescribed (Usually 14 days), you will return the monitor device in the postage paid box. Once it is returned they will download the data collected and provide [place] with a report which the provider will then review and we will call you with those results. Important tips:  1. Avoid showering during the first 24 hours of wearing the monitor. 2. Avoid excessive sweating to help maximize wear time. 3. Do not submerge the device, no hot tubs, and no swimming pools. 4. Keep any lotions or oils away from the patch. 5. After 24 hours you may shower with the patch on. Take brief showers with your back facing the shower head.  6. Do not remove patch once it has been placed because that will interrupt data and decrease adhesive wear time. 7. Push the button when you have any symptoms and write down what you were feeling. 8. Once you have completed wearing your monitor, remove and place into box which has postage paid and place in your outgoing mailbox.  9. If for some reason you have misplaced your box then call our office and we can provide another box and/or mail it off for you.         Follow-Up: At [facility], you and your health needs are our  priority.  As part of our continuing mission to provide you with exceptional heart care, we have created designated Provider Care Teams.  These Care Teams include your primary Cardiologist (physician) and Advanced Practice Providers (APPs -  Physician Assistants and Nurse Practitioners) who all work together to provide you with the care you need, when you need it.  We recommend signing up for the patient portal called "MyChart".  Sign up information is provided on this After Visit Summary.  MyChart is used to connect with patients for Virtual Visits (Telemedicine).  Patients are able to view lab/test results, encounter notes, upcoming appointments, etc.  Non-urgent messages can be sent to your provider as well.   To learn more about what you can do with MyChart, go to [email or web address].    Your next appointment:   1 month(s)  The format for your next appointment:   In Person  Provider:   Nelva Bush, MD [place]

## 2020-12-13 ENCOUNTER — Telehealth: Payer: Self-pay | Admitting: Family Medicine

## 2020-12-13 ENCOUNTER — Telehealth: Payer: Self-pay

## 2020-12-13 DIAGNOSIS — N185 Chronic kidney disease, stage 5: Secondary | ICD-10-CM | POA: Diagnosis not present

## 2020-12-13 DIAGNOSIS — E785 Hyperlipidemia, unspecified: Secondary | ICD-10-CM | POA: Diagnosis not present

## 2020-12-13 DIAGNOSIS — Z471 Aftercare following joint replacement surgery: Secondary | ICD-10-CM | POA: Diagnosis not present

## 2020-12-13 DIAGNOSIS — D631 Anemia in chronic kidney disease: Secondary | ICD-10-CM | POA: Diagnosis not present

## 2020-12-13 DIAGNOSIS — M109 Gout, unspecified: Secondary | ICD-10-CM | POA: Diagnosis not present

## 2020-12-13 DIAGNOSIS — I2510 Atherosclerotic heart disease of native coronary artery without angina pectoris: Secondary | ICD-10-CM | POA: Diagnosis not present

## 2020-12-13 DIAGNOSIS — N186 End stage renal disease: Secondary | ICD-10-CM | POA: Diagnosis not present

## 2020-12-13 DIAGNOSIS — J449 Chronic obstructive pulmonary disease, unspecified: Secondary | ICD-10-CM | POA: Diagnosis not present

## 2020-12-13 DIAGNOSIS — R1310 Dysphagia, unspecified: Secondary | ICD-10-CM | POA: Diagnosis not present

## 2020-12-13 DIAGNOSIS — I1311 Hypertensive heart and chronic kidney disease without heart failure, with stage 5 chronic kidney disease, or end stage renal disease: Secondary | ICD-10-CM | POA: Diagnosis not present

## 2020-12-13 DIAGNOSIS — Z992 Dependence on renal dialysis: Secondary | ICD-10-CM | POA: Diagnosis not present

## 2020-12-13 NOTE — Telephone Encounter (Signed)
Copied from [place] [id number]. Topic: General - Other >> Dec 13, 2020 10:13 AM Tessa Lerner A wrote: Reason for CRM: Colletta Maryland with [facility] made contact to report patient's blood pressure BP Sitting 132/75 BP Standing 90/58  This was taken 12/13/20 9:00 AM approx.

## 2020-12-13 NOTE — Telephone Encounter (Signed)
OK to administer

## 2020-12-13 NOTE — Telephone Encounter (Signed)
Needs sooner appt

## 2020-12-13 NOTE — Telephone Encounter (Signed)
Verbal given 

## 2020-12-13 NOTE — Telephone Encounter (Signed)
Copied from [place] [id number]. Topic: Quick Communication - Home Health Verbal Orders >> Dec 13, 2020 10:12 AM Tessa Lerner A wrote: Caller/Agency: Leory Plowman  Callback Number: [phone]  Requesting OT/PT/Skilled Nursing/Social Work/Speech Therapy: OT  Frequency: 1w1 2w3

## 2020-12-13 NOTE — Telephone Encounter (Signed)
Called spoke with pt's wife debbie she is saying that his medicine was changed by Dr Holley Raring 3/18 and it seemed to be better than what it was. She says that he is seeing 7 different doctors and he is tired of seeing doctors. She said that she will have him here on the 4/19 not earlier

## 2020-12-13 NOTE — Telephone Encounter (Signed)
Colletta Maryland, Occupational therapist at [facility], calling to report a drug interaction between Citalopram and Ondansetron. She states that these are a level 2 interaction. Please advise.        [phone] secure

## 2020-12-14 DIAGNOSIS — E785 Hyperlipidemia, unspecified: Secondary | ICD-10-CM | POA: Diagnosis not present

## 2020-12-14 DIAGNOSIS — Z471 Aftercare following joint replacement surgery: Secondary | ICD-10-CM | POA: Diagnosis not present

## 2020-12-14 DIAGNOSIS — I1311 Hypertensive heart and chronic kidney disease without heart failure, with stage 5 chronic kidney disease, or end stage renal disease: Secondary | ICD-10-CM | POA: Diagnosis not present

## 2020-12-14 DIAGNOSIS — I2510 Atherosclerotic heart disease of native coronary artery without angina pectoris: Secondary | ICD-10-CM | POA: Diagnosis not present

## 2020-12-14 DIAGNOSIS — D631 Anemia in chronic kidney disease: Secondary | ICD-10-CM | POA: Diagnosis not present

## 2020-12-14 DIAGNOSIS — R1310 Dysphagia, unspecified: Secondary | ICD-10-CM | POA: Diagnosis not present

## 2020-12-14 DIAGNOSIS — Z992 Dependence on renal dialysis: Secondary | ICD-10-CM | POA: Diagnosis not present

## 2020-12-14 DIAGNOSIS — M109 Gout, unspecified: Secondary | ICD-10-CM | POA: Diagnosis not present

## 2020-12-14 DIAGNOSIS — N186 End stage renal disease: Secondary | ICD-10-CM | POA: Diagnosis not present

## 2020-12-14 DIAGNOSIS — J449 Chronic obstructive pulmonary disease, unspecified: Secondary | ICD-10-CM | POA: Diagnosis not present

## 2020-12-14 DIAGNOSIS — N185 Chronic kidney disease, stage 5: Secondary | ICD-10-CM | POA: Diagnosis not present

## 2020-12-16 DIAGNOSIS — Z992 Dependence on renal dialysis: Secondary | ICD-10-CM | POA: Diagnosis not present

## 2020-12-16 DIAGNOSIS — N186 End stage renal disease: Secondary | ICD-10-CM | POA: Diagnosis not present

## 2020-12-17 DIAGNOSIS — N186 End stage renal disease: Secondary | ICD-10-CM | POA: Diagnosis not present

## 2020-12-17 DIAGNOSIS — Z992 Dependence on renal dialysis: Secondary | ICD-10-CM | POA: Diagnosis not present

## 2020-12-18 DIAGNOSIS — N185 Chronic kidney disease, stage 5: Secondary | ICD-10-CM | POA: Diagnosis not present

## 2020-12-18 DIAGNOSIS — Z471 Aftercare following joint replacement surgery: Secondary | ICD-10-CM | POA: Diagnosis not present

## 2020-12-18 DIAGNOSIS — R1310 Dysphagia, unspecified: Secondary | ICD-10-CM | POA: Diagnosis not present

## 2020-12-18 DIAGNOSIS — E785 Hyperlipidemia, unspecified: Secondary | ICD-10-CM | POA: Diagnosis not present

## 2020-12-18 DIAGNOSIS — D631 Anemia in chronic kidney disease: Secondary | ICD-10-CM | POA: Diagnosis not present

## 2020-12-18 DIAGNOSIS — I2510 Atherosclerotic heart disease of native coronary artery without angina pectoris: Secondary | ICD-10-CM | POA: Diagnosis not present

## 2020-12-18 DIAGNOSIS — J449 Chronic obstructive pulmonary disease, unspecified: Secondary | ICD-10-CM | POA: Diagnosis not present

## 2020-12-18 DIAGNOSIS — N186 End stage renal disease: Secondary | ICD-10-CM | POA: Diagnosis not present

## 2020-12-18 DIAGNOSIS — I1311 Hypertensive heart and chronic kidney disease without heart failure, with stage 5 chronic kidney disease, or end stage renal disease: Secondary | ICD-10-CM | POA: Diagnosis not present

## 2020-12-18 DIAGNOSIS — Z992 Dependence on renal dialysis: Secondary | ICD-10-CM | POA: Diagnosis not present

## 2020-12-18 DIAGNOSIS — M109 Gout, unspecified: Secondary | ICD-10-CM | POA: Diagnosis not present

## 2020-12-19 DIAGNOSIS — I1311 Hypertensive heart and chronic kidney disease without heart failure, with stage 5 chronic kidney disease, or end stage renal disease: Secondary | ICD-10-CM | POA: Diagnosis not present

## 2020-12-19 DIAGNOSIS — D631 Anemia in chronic kidney disease: Secondary | ICD-10-CM | POA: Diagnosis not present

## 2020-12-19 DIAGNOSIS — N186 End stage renal disease: Secondary | ICD-10-CM | POA: Diagnosis not present

## 2020-12-19 DIAGNOSIS — N185 Chronic kidney disease, stage 5: Secondary | ICD-10-CM | POA: Diagnosis not present

## 2020-12-19 DIAGNOSIS — E785 Hyperlipidemia, unspecified: Secondary | ICD-10-CM | POA: Diagnosis not present

## 2020-12-19 DIAGNOSIS — M109 Gout, unspecified: Secondary | ICD-10-CM | POA: Diagnosis not present

## 2020-12-19 DIAGNOSIS — I2510 Atherosclerotic heart disease of native coronary artery without angina pectoris: Secondary | ICD-10-CM | POA: Diagnosis not present

## 2020-12-19 DIAGNOSIS — Z471 Aftercare following joint replacement surgery: Secondary | ICD-10-CM | POA: Diagnosis not present

## 2020-12-19 DIAGNOSIS — R1310 Dysphagia, unspecified: Secondary | ICD-10-CM | POA: Diagnosis not present

## 2020-12-19 DIAGNOSIS — J449 Chronic obstructive pulmonary disease, unspecified: Secondary | ICD-10-CM | POA: Diagnosis not present

## 2020-12-19 DIAGNOSIS — Z992 Dependence on renal dialysis: Secondary | ICD-10-CM | POA: Diagnosis not present

## 2020-12-20 ENCOUNTER — Other Ambulatory Visit (HOSPITAL_COMMUNITY): Payer: Self-pay

## 2020-12-20 DIAGNOSIS — N185 Chronic kidney disease, stage 5: Secondary | ICD-10-CM | POA: Diagnosis not present

## 2020-12-20 DIAGNOSIS — Z471 Aftercare following joint replacement surgery: Secondary | ICD-10-CM | POA: Diagnosis not present

## 2020-12-20 DIAGNOSIS — R1310 Dysphagia, unspecified: Secondary | ICD-10-CM | POA: Diagnosis not present

## 2020-12-20 DIAGNOSIS — N186 End stage renal disease: Secondary | ICD-10-CM | POA: Diagnosis not present

## 2020-12-20 DIAGNOSIS — D631 Anemia in chronic kidney disease: Secondary | ICD-10-CM | POA: Diagnosis not present

## 2020-12-20 DIAGNOSIS — I1311 Hypertensive heart and chronic kidney disease without heart failure, with stage 5 chronic kidney disease, or end stage renal disease: Secondary | ICD-10-CM | POA: Diagnosis not present

## 2020-12-20 DIAGNOSIS — M109 Gout, unspecified: Secondary | ICD-10-CM | POA: Diagnosis not present

## 2020-12-20 DIAGNOSIS — I2510 Atherosclerotic heart disease of native coronary artery without angina pectoris: Secondary | ICD-10-CM | POA: Diagnosis not present

## 2020-12-20 DIAGNOSIS — J449 Chronic obstructive pulmonary disease, unspecified: Secondary | ICD-10-CM | POA: Diagnosis not present

## 2020-12-20 DIAGNOSIS — Z992 Dependence on renal dialysis: Secondary | ICD-10-CM | POA: Diagnosis not present

## 2020-12-20 DIAGNOSIS — E785 Hyperlipidemia, unspecified: Secondary | ICD-10-CM | POA: Diagnosis not present

## 2020-12-21 DIAGNOSIS — Z992 Dependence on renal dialysis: Secondary | ICD-10-CM | POA: Diagnosis not present

## 2020-12-21 DIAGNOSIS — N186 End stage renal disease: Secondary | ICD-10-CM | POA: Diagnosis not present

## 2020-12-23 DIAGNOSIS — Z992 Dependence on renal dialysis: Secondary | ICD-10-CM | POA: Diagnosis not present

## 2020-12-23 DIAGNOSIS — N186 End stage renal disease: Secondary | ICD-10-CM | POA: Diagnosis not present

## 2020-12-24 DIAGNOSIS — J449 Chronic obstructive pulmonary disease, unspecified: Secondary | ICD-10-CM | POA: Diagnosis not present

## 2020-12-24 DIAGNOSIS — N185 Chronic kidney disease, stage 5: Secondary | ICD-10-CM | POA: Diagnosis not present

## 2020-12-24 DIAGNOSIS — I1311 Hypertensive heart and chronic kidney disease without heart failure, with stage 5 chronic kidney disease, or end stage renal disease: Secondary | ICD-10-CM | POA: Diagnosis not present

## 2020-12-24 DIAGNOSIS — N186 End stage renal disease: Secondary | ICD-10-CM | POA: Diagnosis not present

## 2020-12-24 DIAGNOSIS — E785 Hyperlipidemia, unspecified: Secondary | ICD-10-CM | POA: Diagnosis not present

## 2020-12-24 DIAGNOSIS — Z992 Dependence on renal dialysis: Secondary | ICD-10-CM | POA: Diagnosis not present

## 2020-12-24 DIAGNOSIS — Z471 Aftercare following joint replacement surgery: Secondary | ICD-10-CM | POA: Diagnosis not present

## 2020-12-24 DIAGNOSIS — M109 Gout, unspecified: Secondary | ICD-10-CM | POA: Diagnosis not present

## 2020-12-24 DIAGNOSIS — R1310 Dysphagia, unspecified: Secondary | ICD-10-CM | POA: Diagnosis not present

## 2020-12-24 DIAGNOSIS — D631 Anemia in chronic kidney disease: Secondary | ICD-10-CM | POA: Diagnosis not present

## 2020-12-24 DIAGNOSIS — I2510 Atherosclerotic heart disease of native coronary artery without angina pectoris: Secondary | ICD-10-CM | POA: Diagnosis not present

## 2020-12-25 DIAGNOSIS — N185 Chronic kidney disease, stage 5: Secondary | ICD-10-CM | POA: Diagnosis not present

## 2020-12-25 DIAGNOSIS — Z992 Dependence on renal dialysis: Secondary | ICD-10-CM | POA: Diagnosis not present

## 2020-12-25 DIAGNOSIS — D631 Anemia in chronic kidney disease: Secondary | ICD-10-CM | POA: Diagnosis not present

## 2020-12-25 DIAGNOSIS — N186 End stage renal disease: Secondary | ICD-10-CM | POA: Diagnosis not present

## 2020-12-25 DIAGNOSIS — M109 Gout, unspecified: Secondary | ICD-10-CM | POA: Diagnosis not present

## 2020-12-25 DIAGNOSIS — R1310 Dysphagia, unspecified: Secondary | ICD-10-CM | POA: Diagnosis not present

## 2020-12-25 DIAGNOSIS — I1311 Hypertensive heart and chronic kidney disease without heart failure, with stage 5 chronic kidney disease, or end stage renal disease: Secondary | ICD-10-CM | POA: Diagnosis not present

## 2020-12-25 DIAGNOSIS — J449 Chronic obstructive pulmonary disease, unspecified: Secondary | ICD-10-CM | POA: Diagnosis not present

## 2020-12-25 DIAGNOSIS — I2510 Atherosclerotic heart disease of native coronary artery without angina pectoris: Secondary | ICD-10-CM | POA: Diagnosis not present

## 2020-12-25 DIAGNOSIS — E785 Hyperlipidemia, unspecified: Secondary | ICD-10-CM | POA: Diagnosis not present

## 2020-12-25 DIAGNOSIS — Z471 Aftercare following joint replacement surgery: Secondary | ICD-10-CM | POA: Diagnosis not present

## 2020-12-26 DIAGNOSIS — Z992 Dependence on renal dialysis: Secondary | ICD-10-CM | POA: Diagnosis not present

## 2020-12-26 DIAGNOSIS — N186 End stage renal disease: Secondary | ICD-10-CM | POA: Diagnosis not present

## 2020-12-27 DIAGNOSIS — I1311 Hypertensive heart and chronic kidney disease without heart failure, with stage 5 chronic kidney disease, or end stage renal disease: Secondary | ICD-10-CM | POA: Diagnosis not present

## 2020-12-27 DIAGNOSIS — N186 End stage renal disease: Secondary | ICD-10-CM | POA: Diagnosis not present

## 2020-12-27 DIAGNOSIS — M109 Gout, unspecified: Secondary | ICD-10-CM | POA: Diagnosis not present

## 2020-12-27 DIAGNOSIS — R1310 Dysphagia, unspecified: Secondary | ICD-10-CM | POA: Diagnosis not present

## 2020-12-27 DIAGNOSIS — N185 Chronic kidney disease, stage 5: Secondary | ICD-10-CM | POA: Diagnosis not present

## 2020-12-27 DIAGNOSIS — D631 Anemia in chronic kidney disease: Secondary | ICD-10-CM | POA: Diagnosis not present

## 2020-12-27 DIAGNOSIS — J449 Chronic obstructive pulmonary disease, unspecified: Secondary | ICD-10-CM | POA: Diagnosis not present

## 2020-12-27 DIAGNOSIS — E785 Hyperlipidemia, unspecified: Secondary | ICD-10-CM | POA: Diagnosis not present

## 2020-12-27 DIAGNOSIS — I2510 Atherosclerotic heart disease of native coronary artery without angina pectoris: Secondary | ICD-10-CM | POA: Diagnosis not present

## 2020-12-27 DIAGNOSIS — Z471 Aftercare following joint replacement surgery: Secondary | ICD-10-CM | POA: Diagnosis not present

## 2020-12-27 DIAGNOSIS — Z992 Dependence on renal dialysis: Secondary | ICD-10-CM | POA: Diagnosis not present

## 2020-12-28 ENCOUNTER — Inpatient Hospital Stay: Payer: Medicare HMO

## 2020-12-28 ENCOUNTER — Inpatient Hospital Stay (HOSPITAL_BASED_OUTPATIENT_CLINIC_OR_DEPARTMENT_OTHER): Payer: Medicare HMO | Admitting: Internal Medicine

## 2020-12-28 ENCOUNTER — Encounter: Payer: Self-pay | Admitting: Internal Medicine

## 2020-12-28 ENCOUNTER — Inpatient Hospital Stay: Payer: Medicare HMO | Attending: Internal Medicine

## 2020-12-28 DIAGNOSIS — Z7982 Long term (current) use of aspirin: Secondary | ICD-10-CM | POA: Diagnosis not present

## 2020-12-28 DIAGNOSIS — I120 Hypertensive chronic kidney disease with stage 5 chronic kidney disease or end stage renal disease: Secondary | ICD-10-CM | POA: Diagnosis not present

## 2020-12-28 DIAGNOSIS — F17210 Nicotine dependence, cigarettes, uncomplicated: Secondary | ICD-10-CM | POA: Diagnosis not present

## 2020-12-28 DIAGNOSIS — I2510 Atherosclerotic heart disease of native coronary artery without angina pectoris: Secondary | ICD-10-CM | POA: Diagnosis not present

## 2020-12-28 DIAGNOSIS — J449 Chronic obstructive pulmonary disease, unspecified: Secondary | ICD-10-CM | POA: Diagnosis not present

## 2020-12-28 DIAGNOSIS — Z992 Dependence on renal dialysis: Secondary | ICD-10-CM | POA: Insufficient documentation

## 2020-12-28 DIAGNOSIS — D471 Chronic myeloproliferative disease: Secondary | ICD-10-CM

## 2020-12-28 DIAGNOSIS — N186 End stage renal disease: Secondary | ICD-10-CM | POA: Diagnosis not present

## 2020-12-28 DIAGNOSIS — D649 Anemia, unspecified: Secondary | ICD-10-CM | POA: Diagnosis not present

## 2020-12-28 DIAGNOSIS — I252 Old myocardial infarction: Secondary | ICD-10-CM | POA: Insufficient documentation

## 2020-12-28 DIAGNOSIS — Z7952 Long term (current) use of systemic steroids: Secondary | ICD-10-CM | POA: Insufficient documentation

## 2020-12-28 DIAGNOSIS — E785 Hyperlipidemia, unspecified: Secondary | ICD-10-CM | POA: Insufficient documentation

## 2020-12-28 DIAGNOSIS — Z79899 Other long term (current) drug therapy: Secondary | ICD-10-CM | POA: Insufficient documentation

## 2020-12-28 DIAGNOSIS — R531 Weakness: Secondary | ICD-10-CM | POA: Diagnosis not present

## 2020-12-28 LAB — CBC WITH DIFFERENTIAL/PLATELET
Abs Immature Granulocytes: 0.03 10*3/uL (ref 0.00–0.07)
Basophils Absolute: 0 10*3/uL (ref 0.0–0.1)
Basophils Relative: 0 %
Eosinophils Absolute: 0 10*3/uL (ref 0.0–0.5)
Eosinophils Relative: 1 %
HCT: 27.1 % — ABNORMAL LOW (ref 39.0–52.0)
Hemoglobin: 8.8 g/dL — ABNORMAL LOW (ref 13.0–17.0)
Immature Granulocytes: 1 %
Lymphocytes Relative: 8 %
Lymphs Abs: 0.4 10*3/uL — ABNORMAL LOW (ref 0.7–4.0)
MCH: 30.4 pg (ref 26.0–34.0)
MCHC: 32.5 g/dL (ref 30.0–36.0)
MCV: 93.8 fL (ref 80.0–100.0)
Monocytes Absolute: 0.2 10*3/uL (ref 0.1–1.0)
Monocytes Relative: 4 %
Neutro Abs: 3.9 10*3/uL (ref 1.7–7.7)
Neutrophils Relative %: 86 %
Platelets: 130 10*3/uL — ABNORMAL LOW (ref 150–400)
RBC: 2.89 MIL/uL — ABNORMAL LOW (ref 4.22–5.81)
RDW: 15.9 % — ABNORMAL HIGH (ref 11.5–15.5)
WBC: 4.5 10*3/uL (ref 4.0–10.5)
nRBC: 0 % (ref 0.0–0.2)

## 2020-12-28 LAB — COMPREHENSIVE METABOLIC PANEL
ALT: 12 U/L (ref 0–44)
AST: 13 U/L — ABNORMAL LOW (ref 15–41)
Albumin: 3 g/dL — ABNORMAL LOW (ref 3.5–5.0)
Alkaline Phosphatase: 45 U/L (ref 38–126)
Anion gap: 10 (ref 5–15)
BUN: 50 mg/dL — ABNORMAL HIGH (ref 8–23)
CO2: 22 mmol/L (ref 22–32)
Calcium: 7.6 mg/dL — ABNORMAL LOW (ref 8.9–10.3)
Chloride: 103 mmol/L (ref 98–111)
Creatinine, Ser: 6.48 mg/dL — ABNORMAL HIGH (ref 0.61–1.24)
GFR, Estimated: 9 mL/min — ABNORMAL LOW (ref >60)
Glucose, Bld: 99 mg/dL (ref 70–99)
Potassium: 4.9 mmol/L (ref 3.5–5.1)
Sodium: 135 mmol/L (ref 135–145)
Total Bilirubin: 0.5 mg/dL (ref 0.3–1.2)
Total Protein: 5.6 g/dL — ABNORMAL LOW (ref 6.5–8.1)

## 2020-12-28 LAB — SAMPLE TO BLOOD BANK

## 2020-12-28 LAB — LACTATE DEHYDROGENASE: LDH: 523 U/L — ABNORMAL HIGH (ref 98–192)

## 2020-12-28 NOTE — Progress Notes (Signed)
[facility] OFFICE PROGRESS NOTE  Patient Care Team: Valerie Roys, DO as PCP - General (Family Medicine) End, Harrell Gave, MD as PCP - Cardiology (Cardiology) Cammie Sickle, MD as Consulting Physician (Internal Medicine)  Cancer Staging No matching staging information was found for the patient.   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   # AUGUST 1st week 2019- Retacrit   # October 2nd 2019- jakafi 10 BID [? Renal involvement]; September 2020-peritoneal dialysis; Jakafi 10 mg in the morning; STOPPED/tapered [name], 2021 [sec intolerance fatigue/myalgias]  # April 22nd 2022- START [name] dosing/PD]  # CKD 1.5; poorly controlled HTN; AUG 2018- worsening of renal function Creat ~2.1; AUG 2018- Bil Kidney US- NEG for hydronephrosis. [name]- kidney Bx- FSG [ on Prednisone Dr.[name]]; July 2020-peritoneal dialysis;   # hx of Lung nodules- resolved [[name]]/quit smoking.  DIAGNOSIS: PRIMARY MYELOFIBROSIS  RISK:LOW     ;GOALS: Control  CURRENT/MOST RECENT THERAPY : Surveillance    Primary myelofibrosis ([place])      INTERVAL HISTORY:  James Arnold 65 y.o.  male pleasant patient above history of primary myelofibrosis Jak 2+ currently OFF Jakafi [since July 2021sec to intol]; chronic kidney disease on peritoneal dialysis is here for follow-up.  Patient has not had any further seizures or acute stroke.  He continues to undergo peritoneal dialysis at home.  Is currently getting erythropoietin stimulating agent more often with nephrology.  His appetite is improving.  Currently on prednisone 10 mg a day.  He is gaining weight.  His walk independently.  No nausea no vomiting.  Review of Systems  Constitutional: Positive for malaise/fatigue  and weight loss. Negative for chills, diaphoresis and fever.  HENT: Negative for nosebleeds and sore throat.   Eyes: Negative for double vision.  Respiratory: Positive for cough and shortness of breath. Negative for hemoptysis, sputum production and wheezing.   Cardiovascular: Negative for chest pain, palpitations and orthopnea.  Gastrointestinal: Negative for abdominal pain, blood in stool, constipation, diarrhea, heartburn, melena, nausea and vomiting.  Genitourinary: Negative for dysuria, frequency and urgency.  Musculoskeletal: Positive for back pain and joint pain.  Skin: Negative.  Negative for itching and rash.  Neurological: Negative for dizziness, tingling, focal weakness, weakness and headaches.  Endo/Heme/Allergies: Does not bruise/bleed easily.  Psychiatric/Behavioral: Negative for depression. The patient is not nervous/anxious and does not have insomnia.       PAST MEDICAL HISTORY :  Past Medical History:  Diagnosis Date  . Anemia   . Benign hypertensive renal disease   . CAD (coronary artery disease)    a. 08/1996 s/p BMS to the mLAD ([facility]); b. 12/2017 MV: EF 46%, fixed apical defect w/ significant GI uptake artifact. No ischemia-> low risk.  . CKD (chronic kidney disease), stage V ([place])    on peritoneal dialysis  . Complication of anesthesia    please call by "[name]" when waking up!!  . COPD (chronic obstructive pulmonary disease) ([place])    centrilobular emphysema. pt is poorly controlled with his copd/smoking.  . Depression   . Diastolic dysfunction    a. 12/2017 Echo: EF 60-65%, no rwma, Gr1 DD, mild AI/MR. Nl RVSP.  [name] Kitchen Dyspnea    with exertion  . Fatigue   . FSGS (focal segmental glomerulosclerosis)   . Hyperlipidemia 10/2002  . Hypertension   . myelofibrosis  bone marrow failure  . Myelofibrosis ([place]) 2010   JAK2 (+) primary  . Myocardial infarction ([place]) 1997   stent x 1  . Smoker   . Thrombocytopenia ([place])     PAST SURGICAL HISTORY :   Past Surgical  History:  Procedure Laterality Date  . ANGIOPLASTY    . BONE MARROW ASPIRATION  03/2009  . CARDIAC CATHETERIZATION  1997   stent x 1  . COLONOSCOPY WITH PROPOFOL N/A 08/09/2018   Procedure: COLONOSCOPY WITH PROPOFOL;  Surgeon: [name], MD;  Location: [facility] ENDOSCOPY;  Service: Gastroenterology;  Laterality: N/A;  . CORONARY STENT PLACEMENT  08/1996  . EXTERIORIZATION OF A CONTINUOUS AMBULATORY PERITONEAL DIALYSIS CATHETER    . EYE SURGERY Bilateral    cats removed  . REVERSE SHOULDER ARTHROPLASTY Left 11/19/2020   Procedure: Left reverse shoulder arthroplasty;  Surgeon: Leim Fabry, MD;  Location: [facility] ORS;  Service: Orthopedics;  Laterality: Left;    FAMILY HISTORY :   Family History  Problem Relation Age of Onset  . Stroke Mother        Low BP stroke  . Depression Father   . Depression Sister        Breast  . Heart disease Other   . Breast cancer Other   . Lung cancer Other   . Ovarian cancer Other   . Stomach cancer Other     SOCIAL HISTORY:   Social History   Tobacco Use  . Smoking status: Current Every Day Smoker    Packs/day: 0.75    Years: 47.00    Pack years: 35.25    Types: Cigarettes  . Smokeless tobacco: Never Used  . Tobacco comment: trying to cut back  Vaping Use  . Vaping Use: Never used  Substance Use Topics  . Alcohol use: Not Currently    Comment: rare  . Drug use: No    ALLERGIES:  has No Known Allergies.  MEDICATIONS:  Current Outpatient Medications  Medication Sig Dispense Refill  . aspirin 81 MG chewable tablet Chew 1 tablet (81 mg total) by mouth daily. 30 tablet 0  . carvedilol (COREG) 12.5 MG tablet Take 1.5 tablets (18.75 mg total) by mouth 2 (two) times daily. 270 tablet 3  . Cholecalciferol (VITAMIN D-1000 MAX ST) 25 MCG (1000 UT) tablet Take 1,000 Units by mouth daily.     . citalopram (CELEXA) 40 MG tablet TAKE 1 TABLET EVERY DAY 90 tablet 1  . epoetin alfa-epbx (RETACRIT) 33545 UNIT/ML injection Inject into the skin. Once a  month at dialysis    . ferrous sulfate 325 (65 FE) MG tablet Take 1 tablet (325 mg total) by mouth daily with breakfast. 30 tablet 1  . furosemide (LASIX) 20 MG tablet Take 1 tablet (20 mg total) by mouth daily. 90 tablet 1  . gentamicin cream (GARAMYCIN) 0.1 % Apply 1 application topically every other day.    . levETIRAcetam (KEPPRA) 500 MG tablet Take 1 tablet (500 mg total) by mouth at bedtime. 30 tablet 0  . ondansetron (ZOFRAN ODT) 8 MG disintegrating tablet Take 1 tablet (8 mg total) by mouth every 8 (eight) hours as needed for nausea or vomiting. 30 tablet 1  . polyethylene glycol (MIRALAX / GLYCOLAX) 17 g packet Take 17 g by mouth daily as needed. 14 each 0  . predniSONE (DELTASONE) 10 MG tablet Take 10 mg by mouth daily with breakfast.    . tamsulosin (FLOMAX) 0.4 MG CAPS capsule Take 1 capsule (0.4 mg total) by mouth  daily. (Patient not taking: Reported on 12/28/2020) 30 capsule 0   No current facility-administered medications for this visit.    PHYSICAL EXAMINATION: ECOG PERFORMANCE STATUS: 1 - Symptomatic but completely ambulatory  BP (!) 141/78 (BP Location: Left Arm, Patient Position: Sitting)   Pulse 71   Temp (!) 97 F (36.1 C) (Tympanic)   Resp 18   Wt 154 lb (69.9 kg)   SpO2 99%   BMI 24.12 kg/m   Filed Weights   12/28/20 0924  Weight: 154 lb (69.9 kg)    Physical Exam Constitutional:      Comments: Patient is in a wheelchair.  He is accompanied by his wife.  Appears cachectic.  HENT:     Head: Normocephalic and atraumatic.     Mouth/Throat:     Pharynx: No oropharyngeal exudate.  Eyes:     Pupils: Pupils are equal, round, and reactive to light.  Cardiovascular:     Rate and Rhythm: Normal rate and regular rhythm.  Pulmonary:     Effort: No respiratory distress.     Breath sounds: No wheezing.     Comments: Decreased air entry bilaterally. Abdominal:     General: Bowel sounds are normal. There is no distension.     Palpations: Abdomen is soft. There is  no mass.     Tenderness: There is no abdominal tenderness. There is no guarding or rebound.  Musculoskeletal:        General: No tenderness. Normal range of motion.     Cervical back: Normal range of motion and neck supple.  Skin:    General: Skin is warm.  Neurological:     Mental Status: He is alert and oriented to person, place, and time.     Comments: Left upper extremity weakness noted.    Psychiatric:        Mood and Affect: Affect normal.     LABORATORY DATA:  I have reviewed the data as listed    Component Value Date/Time   NA 135 12/28/2020 0857   NA 136 07/10/2020 0909   K 4.9 12/28/2020 0857   CL 103 12/28/2020 0857   CO2 22 12/28/2020 0857   GLUCOSE 99 12/28/2020 0857   BUN 50 (H) 12/28/2020 0857   BUN 45 (H) 07/10/2020 0909   CREATININE 6.48 (H) 12/28/2020 0857   CREATININE 1.12 09/30/2013 0953   CALCIUM 7.6 (L) 12/28/2020 0857   PROT 5.6 (L) 12/28/2020 0857   PROT 6.0 07/10/2020 0909   ALBUMIN 3.0 (L) 12/28/2020 0857   ALBUMIN 4.1 07/10/2020 0909   AST 13 (L) 12/28/2020 0857   ALT 12 12/28/2020 0857   ALKPHOS 45 12/28/2020 0857   BILITOT 0.5 12/28/2020 0857   BILITOT 0.4 07/10/2020 0909   GFRNONAA 9 (L) 12/28/2020 0857   GFRNONAA >60 09/30/2013 0953   GFRAA 14 (L) 07/10/2020 0909   GFRAA >60 09/30/2013 0953    No results found for: SPEP, UPEP  Lab Results  Component Value Date   WBC 4.5 12/28/2020   NEUTROABS 3.9 12/28/2020   HGB 8.8 (L) 12/28/2020   HCT 27.1 (L) 12/28/2020   MCV 93.8 12/28/2020   PLT 130 (L) 12/28/2020      Chemistry      Component Value Date/Time   NA 135 12/28/2020 0857   NA 136 07/10/2020 0909   K 4.9 12/28/2020 0857   CL 103 12/28/2020 0857   CO2 22 12/28/2020 0857   BUN 50 (H) 12/28/2020 0857   BUN 45 (H)  07/10/2020 0909   CREATININE 6.48 (H) 12/28/2020 0857   CREATININE 1.12 09/30/2013 0953      Component Value Date/Time   CALCIUM 7.6 (L) 12/28/2020 0857   ALKPHOS 45 12/28/2020 0857   AST 13 (L) 12/28/2020  0857   ALT 12 12/28/2020 0857   BILITOT 0.5 12/28/2020 0857   BILITOT 0.4 07/10/2020 0909       RADIOGRAPHIC STUDIES: I have personally reviewed the radiological images as listed and agreed with the findings in the report. No results found.   ASSESSMENT & PLAN:  Primary myelofibrosis ([place]) # Primary Myelofiborisis [Bone marrow 2010] jak-2 POSITIVE; currently on surveillance. OFF Jakafi because of intolerance.   # Clinically worsening [fatigue; weight loss; dyspnea- see below]; Ultrasound February 2022-worsening splenomegaly/clinically concerning for worsening symptoms secondary myelofibrosis. Finally, agrees to [facility] on April 22nd [2 weeks from now]; renal dosing/discussed with pharmacy.  #Anemia-currently on EPO stimulating agent /IV iron as per nephrology; monitor anemia closely on Jakafi.  Discussed with Dr. Holley Raring.  #Stroke/left upper extremity weakness/seizures- [s/p shoulder surgery]- STABLE; on antiplatelet therapy.  #COPD-poorly controlled; declines PFTs; recommend continue prednisone 10 mg a day.  #End-stage renal disease [Dr. Latif]  on PD; STABLE   # COVID vaccine: DECLINED Covid-19 vaccine; recommend restart acyclovir. # DISPOSITION: home health # HOLD PRBC transfusion # follow up in 1 months-labs- MD-CBC/CMP;LDH;HOLD tube; posssible1 unit PRBC tranfusion--Dr.B  Cc; Dr.[name] /Dr.Johnson   Orders Placed This Encounter  Procedures  . CBC with Differential/Platelet    Standing Status:   Future    Standing Expiration Date:   12/28/2021  . Comprehensive metabolic panel    Standing Status:   Future    Standing Expiration Date:   12/28/2021  . Lactate dehydrogenase    Standing Status:   Future    Standing Expiration Date:   12/28/2021  . Hold Tube- Blood Bank    Standing Status:   Future    Standing Expiration Date:   12/28/2021   All questions were answered. The patient knows to call the clinic with any problems, questions or concerns.     Cammie Sickle, MD 12/28/2020 10:40 AM

## 2020-12-28 NOTE — Assessment & Plan Note (Addendum)
#   Primary Myelofiborisis [Bone marrow 2010] jak-2 POSITIVE; currently on surveillance. OFF Jakafi because of intolerance.   # Clinically worsening [fatigue; weight loss; dyspnea- see below]; Ultrasound February 2022-worsening splenomegaly/clinically concerning for worsening symptoms secondary myelofibrosis. Finally, agrees to [facility] on April 22nd [2 weeks from now]; renal dosing/discussed with pharmacy.  #Anemia-currently on EPO stimulating agent /IV iron as per nephrology; monitor anemia closely on Jakafi.  Discussed with Dr. Holley Raring.  #Stroke/left upper extremity weakness/seizures- [s/p shoulder surgery]- STABLE; on antiplatelet therapy.  #COPD-poorly controlled; declines PFTs; recommend continue prednisone 10 mg a day.  #End-stage renal disease [Dr. Latif]  on PD; STABLE   # COVID vaccine: DECLINED Covid-19 vaccine; recommend restart acyclovir. # DISPOSITION: home health # HOLD PRBC transfusion # follow up in 1 months-labs- MD-CBC/CMP;LDH;HOLD tube; posssible1 unit PRBC tranfusion--Dr.B  Cc; Dr.[name] /Dr.Johnson

## 2020-12-28 NOTE — Patient Instructions (Signed)
#  Start taking Jakafi-as directed; starting on April 22.  # also start taking acyclovir/shingles medication-to prevent shingles

## 2020-12-28 NOTE — Progress Notes (Signed)
Pt and wife in for follow up, pt reports still having difficulty from surgery.

## 2020-12-30 ENCOUNTER — Encounter: Payer: Self-pay | Admitting: Internal Medicine

## 2020-12-30 DIAGNOSIS — N186 End stage renal disease: Secondary | ICD-10-CM | POA: Diagnosis not present

## 2020-12-30 DIAGNOSIS — Z992 Dependence on renal dialysis: Secondary | ICD-10-CM | POA: Diagnosis not present

## 2020-12-31 DIAGNOSIS — I1311 Hypertensive heart and chronic kidney disease without heart failure, with stage 5 chronic kidney disease, or end stage renal disease: Secondary | ICD-10-CM | POA: Diagnosis not present

## 2020-12-31 DIAGNOSIS — Z992 Dependence on renal dialysis: Secondary | ICD-10-CM | POA: Diagnosis not present

## 2020-12-31 DIAGNOSIS — N186 End stage renal disease: Secondary | ICD-10-CM | POA: Diagnosis not present

## 2020-12-31 DIAGNOSIS — M109 Gout, unspecified: Secondary | ICD-10-CM | POA: Diagnosis not present

## 2020-12-31 DIAGNOSIS — R1310 Dysphagia, unspecified: Secondary | ICD-10-CM | POA: Diagnosis not present

## 2020-12-31 DIAGNOSIS — N185 Chronic kidney disease, stage 5: Secondary | ICD-10-CM | POA: Diagnosis not present

## 2020-12-31 DIAGNOSIS — I2510 Atherosclerotic heart disease of native coronary artery without angina pectoris: Secondary | ICD-10-CM | POA: Diagnosis not present

## 2020-12-31 DIAGNOSIS — Z471 Aftercare following joint replacement surgery: Secondary | ICD-10-CM | POA: Diagnosis not present

## 2020-12-31 DIAGNOSIS — J449 Chronic obstructive pulmonary disease, unspecified: Secondary | ICD-10-CM | POA: Diagnosis not present

## 2020-12-31 DIAGNOSIS — E785 Hyperlipidemia, unspecified: Secondary | ICD-10-CM | POA: Diagnosis not present

## 2020-12-31 DIAGNOSIS — D631 Anemia in chronic kidney disease: Secondary | ICD-10-CM | POA: Diagnosis not present

## 2021-01-01 DIAGNOSIS — Z992 Dependence on renal dialysis: Secondary | ICD-10-CM | POA: Diagnosis not present

## 2021-01-01 DIAGNOSIS — N186 End stage renal disease: Secondary | ICD-10-CM | POA: Diagnosis not present

## 2021-01-02 ENCOUNTER — Other Ambulatory Visit (HOSPITAL_COMMUNITY): Payer: Self-pay

## 2021-01-02 DIAGNOSIS — N185 Chronic kidney disease, stage 5: Secondary | ICD-10-CM | POA: Diagnosis not present

## 2021-01-02 DIAGNOSIS — J449 Chronic obstructive pulmonary disease, unspecified: Secondary | ICD-10-CM | POA: Diagnosis not present

## 2021-01-02 DIAGNOSIS — I2510 Atherosclerotic heart disease of native coronary artery without angina pectoris: Secondary | ICD-10-CM | POA: Diagnosis not present

## 2021-01-02 DIAGNOSIS — M109 Gout, unspecified: Secondary | ICD-10-CM | POA: Diagnosis not present

## 2021-01-02 DIAGNOSIS — Z96612 Presence of left artificial shoulder joint: Secondary | ICD-10-CM | POA: Diagnosis not present

## 2021-01-02 DIAGNOSIS — I1311 Hypertensive heart and chronic kidney disease without heart failure, with stage 5 chronic kidney disease, or end stage renal disease: Secondary | ICD-10-CM | POA: Diagnosis not present

## 2021-01-02 DIAGNOSIS — Z471 Aftercare following joint replacement surgery: Secondary | ICD-10-CM | POA: Diagnosis not present

## 2021-01-02 DIAGNOSIS — E785 Hyperlipidemia, unspecified: Secondary | ICD-10-CM | POA: Diagnosis not present

## 2021-01-02 DIAGNOSIS — R1310 Dysphagia, unspecified: Secondary | ICD-10-CM | POA: Diagnosis not present

## 2021-01-02 DIAGNOSIS — Z992 Dependence on renal dialysis: Secondary | ICD-10-CM | POA: Diagnosis not present

## 2021-01-02 DIAGNOSIS — D631 Anemia in chronic kidney disease: Secondary | ICD-10-CM | POA: Diagnosis not present

## 2021-01-02 DIAGNOSIS — N186 End stage renal disease: Secondary | ICD-10-CM | POA: Diagnosis not present

## 2021-01-02 DIAGNOSIS — Z8673 Personal history of transient ischemic attack (TIA), and cerebral infarction without residual deficits: Secondary | ICD-10-CM | POA: Diagnosis not present

## 2021-01-03 DIAGNOSIS — M109 Gout, unspecified: Secondary | ICD-10-CM | POA: Diagnosis not present

## 2021-01-03 DIAGNOSIS — Z992 Dependence on renal dialysis: Secondary | ICD-10-CM | POA: Diagnosis not present

## 2021-01-03 DIAGNOSIS — I1311 Hypertensive heart and chronic kidney disease without heart failure, with stage 5 chronic kidney disease, or end stage renal disease: Secondary | ICD-10-CM | POA: Diagnosis not present

## 2021-01-03 DIAGNOSIS — N185 Chronic kidney disease, stage 5: Secondary | ICD-10-CM | POA: Diagnosis not present

## 2021-01-03 DIAGNOSIS — Z471 Aftercare following joint replacement surgery: Secondary | ICD-10-CM | POA: Diagnosis not present

## 2021-01-03 DIAGNOSIS — E785 Hyperlipidemia, unspecified: Secondary | ICD-10-CM | POA: Diagnosis not present

## 2021-01-03 DIAGNOSIS — N186 End stage renal disease: Secondary | ICD-10-CM | POA: Diagnosis not present

## 2021-01-03 DIAGNOSIS — J449 Chronic obstructive pulmonary disease, unspecified: Secondary | ICD-10-CM | POA: Diagnosis not present

## 2021-01-03 DIAGNOSIS — R1310 Dysphagia, unspecified: Secondary | ICD-10-CM | POA: Diagnosis not present

## 2021-01-03 DIAGNOSIS — D631 Anemia in chronic kidney disease: Secondary | ICD-10-CM | POA: Diagnosis not present

## 2021-01-03 DIAGNOSIS — I2510 Atherosclerotic heart disease of native coronary artery without angina pectoris: Secondary | ICD-10-CM | POA: Diagnosis not present

## 2021-01-04 ENCOUNTER — Telehealth: Payer: Self-pay | Admitting: Family Medicine

## 2021-01-04 ENCOUNTER — Other Ambulatory Visit (HOSPITAL_COMMUNITY): Payer: Self-pay

## 2021-01-04 DIAGNOSIS — Z992 Dependence on renal dialysis: Secondary | ICD-10-CM | POA: Diagnosis not present

## 2021-01-04 DIAGNOSIS — N186 End stage renal disease: Secondary | ICD-10-CM | POA: Diagnosis not present

## 2021-01-04 DIAGNOSIS — E785 Hyperlipidemia, unspecified: Secondary | ICD-10-CM | POA: Diagnosis not present

## 2021-01-04 DIAGNOSIS — J449 Chronic obstructive pulmonary disease, unspecified: Secondary | ICD-10-CM | POA: Diagnosis not present

## 2021-01-04 DIAGNOSIS — R1310 Dysphagia, unspecified: Secondary | ICD-10-CM | POA: Diagnosis not present

## 2021-01-04 DIAGNOSIS — M109 Gout, unspecified: Secondary | ICD-10-CM | POA: Diagnosis not present

## 2021-01-04 DIAGNOSIS — D631 Anemia in chronic kidney disease: Secondary | ICD-10-CM | POA: Diagnosis not present

## 2021-01-04 DIAGNOSIS — I2510 Atherosclerotic heart disease of native coronary artery without angina pectoris: Secondary | ICD-10-CM | POA: Diagnosis not present

## 2021-01-04 DIAGNOSIS — Z471 Aftercare following joint replacement surgery: Secondary | ICD-10-CM | POA: Diagnosis not present

## 2021-01-04 DIAGNOSIS — N185 Chronic kidney disease, stage 5: Secondary | ICD-10-CM | POA: Diagnosis not present

## 2021-01-04 DIAGNOSIS — I1311 Hypertensive heart and chronic kidney disease without heart failure, with stage 5 chronic kidney disease, or end stage renal disease: Secondary | ICD-10-CM | POA: Diagnosis not present

## 2021-01-04 NOTE — Telephone Encounter (Signed)
Stephanie with [facility] calling for verbal OT orders 1 wk 4  cb  [phone]

## 2021-01-06 DIAGNOSIS — Z992 Dependence on renal dialysis: Secondary | ICD-10-CM | POA: Diagnosis not present

## 2021-01-06 DIAGNOSIS — N186 End stage renal disease: Secondary | ICD-10-CM | POA: Diagnosis not present

## 2021-01-07 DIAGNOSIS — Z992 Dependence on renal dialysis: Secondary | ICD-10-CM | POA: Diagnosis not present

## 2021-01-07 DIAGNOSIS — N186 End stage renal disease: Secondary | ICD-10-CM | POA: Diagnosis not present

## 2021-01-07 NOTE — Telephone Encounter (Signed)
Please advise 

## 2021-01-07 NOTE — Telephone Encounter (Signed)
Called and left a detailed message giving verbal orders

## 2021-01-07 NOTE — Telephone Encounter (Signed)
OK for verbal orders?

## 2021-01-08 ENCOUNTER — Other Ambulatory Visit: Payer: Self-pay

## 2021-01-08 ENCOUNTER — Encounter: Payer: Self-pay | Admitting: Family Medicine

## 2021-01-08 ENCOUNTER — Ambulatory Visit (INDEPENDENT_AMBULATORY_CARE_PROVIDER_SITE_OTHER): Payer: Medicare HMO | Admitting: Family Medicine

## 2021-01-08 VITALS — BP 132/56 | HR 76 | Temp 97.8°F | Wt 154.8 lb

## 2021-01-08 DIAGNOSIS — I700 Atherosclerosis of aorta: Secondary | ICD-10-CM

## 2021-01-08 DIAGNOSIS — D692 Other nonthrombocytopenic purpura: Secondary | ICD-10-CM

## 2021-01-08 DIAGNOSIS — D7581 Myelofibrosis: Secondary | ICD-10-CM | POA: Diagnosis not present

## 2021-01-08 DIAGNOSIS — E782 Mixed hyperlipidemia: Secondary | ICD-10-CM

## 2021-01-08 DIAGNOSIS — D631 Anemia in chronic kidney disease: Secondary | ICD-10-CM | POA: Diagnosis not present

## 2021-01-08 DIAGNOSIS — R1310 Dysphagia, unspecified: Secondary | ICD-10-CM | POA: Diagnosis not present

## 2021-01-08 DIAGNOSIS — D471 Chronic myeloproliferative disease: Secondary | ICD-10-CM

## 2021-01-08 DIAGNOSIS — I129 Hypertensive chronic kidney disease with stage 1 through stage 4 chronic kidney disease, or unspecified chronic kidney disease: Secondary | ICD-10-CM | POA: Diagnosis not present

## 2021-01-08 DIAGNOSIS — I132 Hypertensive heart and chronic kidney disease with heart failure and with stage 5 chronic kidney disease, or end stage renal disease: Secondary | ICD-10-CM

## 2021-01-08 DIAGNOSIS — I1311 Hypertensive heart and chronic kidney disease without heart failure, with stage 5 chronic kidney disease, or end stage renal disease: Secondary | ICD-10-CM | POA: Diagnosis not present

## 2021-01-08 DIAGNOSIS — N185 Chronic kidney disease, stage 5: Secondary | ICD-10-CM

## 2021-01-08 DIAGNOSIS — J449 Chronic obstructive pulmonary disease, unspecified: Secondary | ICD-10-CM | POA: Diagnosis not present

## 2021-01-08 DIAGNOSIS — I2510 Atherosclerotic heart disease of native coronary artery without angina pectoris: Secondary | ICD-10-CM | POA: Diagnosis not present

## 2021-01-08 DIAGNOSIS — J432 Centrilobular emphysema: Secondary | ICD-10-CM | POA: Diagnosis not present

## 2021-01-08 DIAGNOSIS — M109 Gout, unspecified: Secondary | ICD-10-CM | POA: Diagnosis not present

## 2021-01-08 DIAGNOSIS — E785 Hyperlipidemia, unspecified: Secondary | ICD-10-CM | POA: Diagnosis not present

## 2021-01-08 DIAGNOSIS — Z992 Dependence on renal dialysis: Secondary | ICD-10-CM | POA: Diagnosis not present

## 2021-01-08 DIAGNOSIS — F3342 Major depressive disorder, recurrent, in full remission: Secondary | ICD-10-CM

## 2021-01-08 DIAGNOSIS — E875 Hyperkalemia: Secondary | ICD-10-CM

## 2021-01-08 DIAGNOSIS — Z471 Aftercare following joint replacement surgery: Secondary | ICD-10-CM | POA: Diagnosis not present

## 2021-01-08 DIAGNOSIS — N186 End stage renal disease: Secondary | ICD-10-CM | POA: Diagnosis not present

## 2021-01-08 MED ORDER — FUROSEMIDE 20 MG PO TABS: 20.0000 mg | ORAL_TABLET | Freq: Every day | ORAL | 1 refills | Status: DC

## 2021-01-08 MED ORDER — OMEPRAZOLE 20 MG PO CPDR: 20.0000 mg | DELAYED_RELEASE_CAPSULE | Freq: Every day | ORAL | 1 refills | Status: DC

## 2021-01-08 MED ORDER — ASPIRIN 81 MG PO CHEW: 81.0000 mg | CHEWABLE_TABLET | Freq: Every day | ORAL | 1 refills | Status: DC

## 2021-01-08 MED ORDER — CITALOPRAM HYDROBROMIDE 40 MG PO TABS: 40.0000 mg | ORAL_TABLET | Freq: Every day | ORAL | 1 refills | Status: DC

## 2021-01-08 NOTE — Assessment & Plan Note (Signed)
Reassured patient. Continue to monitor.  

## 2021-01-08 NOTE — Assessment & Plan Note (Signed)
Under good control on current regimen. Continue current regimen. Continue to monitor. Call with any concerns. Refills given. Labs drawn today.   

## 2021-01-08 NOTE — Assessment & Plan Note (Signed)
Rechecking labs today. Await results. Treat as needed.  °

## 2021-01-08 NOTE — Progress Notes (Signed)
BP (!) 132/56   Pulse 76   Temp 97.8 F (36.6 C)   Wt 154 lb 12.8 oz (70.2 kg)   SpO2 99%   BMI 24.25 kg/m    Subjective:    [name], male    DOB: [birth year], 65 y.o.   MRN: [id number]  HPI: James Arnold is a 65 y.o. male  Chief Complaint  Patient presents with  . Hypertension  . Hyperlipidemia  . Chronic Kidney Disease  . Depression   HYPERTENSION / HYPERLIPIDEMIA Satisfied with current treatment? yes Duration of hypertension: chronic BP monitoring frequency: not checking BP medication side effects: no Past BP meds: carvedilol, lasix Duration of hyperlipidemia: chronic Cholesterol medication side effects: no Cholesterol supplements: none Past cholesterol medications: none Medication compliance: excellent compliance Aspirin: no Recent stressors: no Recurrent headaches: no Visual changes: no Palpitations: no Dyspnea: no Chest pain: no Lower extremity edema: no Dizzy/lightheaded: no  DEPRESSION Mood status: controlled Satisfied with current treatment?: yes Symptom severity: mild  Duration of current treatment : chronic Side effects: no Medication compliance: excellent compliance Psychotherapy/counseling: no  Previous psychiatric medications: celexa Depressed mood: no Anxious mood: no Anhedonia: no Significant weight loss or gain: no Insomnia: no  Fatigue: yes Feelings of worthlessness or guilt: no Impaired concentration/indecisiveness: no Suicidal ideations: no Hopelessness: no Crying spells: no Depression screen [facility] 2/9 11/16/2020 10/23/2020 07/10/2020 05/03/2020 01/09/2020  Decreased Interest 0 0 0 1 0  Down, Depressed, Hopeless 0 0 0 0 0  PHQ - 2 Score 0 0 0 1 0  Altered sleeping 0 0 0 0 0  Tired, decreased energy 3 3 0 1 0  Change in appetite 0 0 0 0 0  Feeling bad or failure about yourself  0 0 0 0 0  Trouble concentrating 0 0 0 0 0  Moving slowly or fidgety/restless 0 0 0 0 0  Suicidal thoughts 0 0 0 0 0  PHQ-9 Score 3 3 0  2 0  Difficult doing work/chores - Not difficult at all Not difficult at all - Not difficult at all  Some recent data might be hidden    Relevant past medical, surgical, family and social history reviewed and updated as indicated. Interim medical history since our last visit reviewed. Allergies and medications reviewed and updated.  Review of Systems  Constitutional: Negative.   Respiratory: Negative.   Cardiovascular: Negative.   Gastrointestinal: Negative.   Musculoskeletal: Negative.   Neurological: Positive for weakness. Negative for dizziness, tremors, seizures, syncope, facial asymmetry, speech difficulty, light-headedness, numbness and headaches.  Hematological: Negative.   Psychiatric/Behavioral: Negative.     Per HPI unless specifically indicated above     Objective:    BP (!) 132/56   Pulse 76   Temp 97.8 F (36.6 C)   Wt 154 lb 12.8 oz (70.2 kg)   SpO2 99%   BMI 24.25 kg/m   Wt Readings from Last 3 Encounters:  01/08/21 154 lb 12.8 oz (70.2 kg)  12/28/20 154 lb (69.9 kg)  12/12/20 150 lb (68 kg)    Physical Exam Vitals and nursing note reviewed.  Constitutional:      General: He is not in acute distress.    Appearance: Normal appearance. He is not ill-appearing, toxic-appearing or diaphoretic.  HENT:     Head: Normocephalic and atraumatic.     Right Ear: External ear normal.     Left Ear: External ear normal.     Nose: Nose normal.  Mouth/Throat:     Mouth: Mucous membranes are moist.     Pharynx: Oropharynx is clear.  Eyes:     General: No scleral icterus.       Right eye: No discharge.        Left eye: No discharge.     Extraocular Movements: Extraocular movements intact.     Conjunctiva/sclera: Conjunctivae normal.     Pupils: Pupils are equal, round, and reactive to light.  Cardiovascular:     Rate and Rhythm: Normal rate and regular rhythm.     Pulses: Normal pulses.     Heart sounds: Normal heart sounds. No murmur heard. No friction  rub. No gallop.   Pulmonary:     Effort: Pulmonary effort is normal. No respiratory distress.     Breath sounds: Normal breath sounds. No stridor. No wheezing, rhonchi or rales.  Chest:     Chest wall: No tenderness.  Musculoskeletal:        General: Normal range of motion.     Cervical back: Normal range of motion and neck supple.  Skin:    General: Skin is warm and dry.     Capillary Refill: Capillary refill takes less than 2 seconds.     Coloration: Skin is not jaundiced or pale.     Findings: No bruising, erythema, lesion or rash.  Neurological:     General: No focal deficit present.     Mental Status: He is alert and oriented to person, place, and time. Mental status is at baseline.  Psychiatric:        Mood and Affect: Mood normal.        Behavior: Behavior normal.        Thought Content: Thought content normal.        Judgment: Judgment normal.     Results for orders placed or performed in visit on 12/28/20  Lactate dehydrogenase  Result Value Ref Range   LDH 523 (H) 98 - 192 U/L  Comprehensive metabolic panel  Result Value Ref Range   Sodium 135 135 - 145 mmol/L   Potassium 4.9 3.5 - 5.1 mmol/L   Chloride 103 98 - 111 mmol/L   CO2 22 22 - 32 mmol/L   Glucose, Bld 99 70 - 99 mg/dL   BUN 50 (H) 8 - 23 mg/dL   Creatinine, Ser 6.48 (H) 0.61 - 1.24 mg/dL   Calcium 7.6 (L) 8.9 - 10.3 mg/dL   Total Protein 5.6 (L) 6.5 - 8.1 g/dL   Albumin 3.0 (L) 3.5 - 5.0 g/dL   AST 13 (L) 15 - 41 U/L   ALT 12 0 - 44 U/L   Alkaline Phosphatase 45 38 - 126 U/L   Total Bilirubin 0.5 0.3 - 1.2 mg/dL   GFR, Estimated 9 (L) >60 mL/min   Anion gap 10 5 - 15  CBC with Differential/Platelet  Result Value Ref Range   WBC 4.5 4.0 - 10.5 K/uL   RBC 2.89 (L) 4.22 - 5.81 MIL/uL   Hemoglobin 8.8 (L) 13.0 - 17.0 g/dL   HCT 27.1 (L) 39.0 - 52.0 %   MCV 93.8 80.0 - 100.0 fL   MCH 30.4 26.0 - 34.0 pg   MCHC 32.5 30.0 - 36.0 g/dL   RDW 15.9 (H) 11.5 - 15.5 %   Platelets 130 (L) 150 - 400 K/uL    nRBC 0.0 0.0 - 0.2 %   Neutrophils Relative % 86 %   Neutro Abs 3.9 1.7 - 7.7 K/uL  Lymphocytes Relative 8 %   Lymphs Abs 0.4 (L) 0.7 - 4.0 K/uL   Monocytes Relative 4 %   Monocytes Absolute 0.2 0.1 - 1.0 K/uL   Eosinophils Relative 1 %   Eosinophils Absolute 0.0 0.0 - 0.5 K/uL   Basophils Relative 0 %   Basophils Absolute 0.0 0.0 - 0.1 K/uL   Immature Granulocytes 1 %   Abs Immature Granulocytes 0.03 0.00 - 0.07 K/uL  Hold Tube- Blood Bank  Result Value Ref Range   Blood Bank Specimen SAMPLE AVAILABLE FOR TESTING    Sample Expiration      12/31/2020,2359 Performed at [facility], [address] [place], [place] [address]       Assessment & Plan:   Problem List Items Addressed This Visit      Cardiovascular and Mediastinum   Senile purpura ([place])    Reassured patient. Continue to monitor.       Relevant Medications   furosemide (LASIX) 20 MG tablet   aspirin 81 MG chewable tablet   Hypertensive heart and chronic kidney disease with heart failure and with stage 5 chronic kidney disease, or end stage renal disease (HCC)    Under good control on current regimen. Continue current regimen. Continue to monitor. Call with any concerns. Refills given. Labs drawn today.        Relevant Medications   furosemide (LASIX) 20 MG tablet   aspirin 81 MG chewable tablet   Other Relevant Orders   Comprehensive metabolic panel   CBC with Differential/Platelet   Aortic atherosclerosis (HCC)    Will keep BP and cholesterol under good control. Continue to monitor. Call with any concerns.       Relevant Medications   furosemide (LASIX) 20 MG tablet   aspirin 81 MG chewable tablet     Respiratory   Centrilobular emphysema (HCC)    Under good control on current regimen. Continue current regimen. Continue to monitor. Call with any concerns. Refills given. Labs drawn today.        Relevant Orders   Comprehensive metabolic panel   CBC with Differential/Platelet      Genitourinary   Benign hypertensive renal disease - Primary    Under good control on current regimen. Continue current regimen. Continue to monitor. Call with any concerns. Refills given. Labs drawn today.        Relevant Medications   furosemide (LASIX) 20 MG tablet   Other Relevant Orders   Comprehensive metabolic panel   CBC with Differential/Platelet   CKD (chronic kidney disease) stage 5, GFR less than 15 ml/min (HCC)    Rechecking labs today. Await results. Treat as needed.       Relevant Orders   Comprehensive metabolic panel   CBC with Differential/Platelet     Other   Primary myelofibrosis ([place])    Continue to follow with hematology. Call with any concerns. Continue to monitor.       Relevant Medications   aspirin 81 MG chewable tablet   Hyperlipidemia    Rechecking labs today. Await results. Treat as needed.       Relevant Medications   furosemide (LASIX) 20 MG tablet   aspirin 81 MG chewable tablet   Other Relevant Orders   Comprehensive metabolic panel   CBC with Differential/Platelet   Lipid Panel w/o Chol/HDL Ratio   Gout    Rechecking labs today. Await results. Treat as needed.       Relevant Orders   Comprehensive metabolic panel  CBC with Differential/Platelet   Uric acid   Depression    Under good control on current regimen. Continue current regimen. Continue to monitor. Call with any concerns. Refills given. Labs drawn today.        Relevant Medications   citalopram (CELEXA) 40 MG tablet   Other Relevant Orders   Comprehensive metabolic panel   CBC with Differential/Platelet   Hyperkalemia    Rechecking labs today. Await results. Treat as needed.         Other Visit Diagnoses    Myelofibrosis ([place])       Relevant Orders   Comprehensive metabolic panel   CBC with Differential/Platelet       Follow up plan: Return in about 6 months (around 07/10/2021) for physical.

## 2021-01-08 NOTE — Assessment & Plan Note (Addendum)
Rechecking labs today. Await results. Treat as needed.  °

## 2021-01-08 NOTE — Assessment & Plan Note (Signed)
Will keep BP and cholesterol under good control. Continue to monitor. Call with any concerns.  

## 2021-01-08 NOTE — Assessment & Plan Note (Signed)
Continue to follow with hematology. Call with any concerns. Continue to monitor.  ?

## 2021-01-09 DIAGNOSIS — R1310 Dysphagia, unspecified: Secondary | ICD-10-CM | POA: Diagnosis not present

## 2021-01-09 DIAGNOSIS — I1311 Hypertensive heart and chronic kidney disease without heart failure, with stage 5 chronic kidney disease, or end stage renal disease: Secondary | ICD-10-CM | POA: Diagnosis not present

## 2021-01-09 DIAGNOSIS — M109 Gout, unspecified: Secondary | ICD-10-CM | POA: Diagnosis not present

## 2021-01-09 DIAGNOSIS — N186 End stage renal disease: Secondary | ICD-10-CM | POA: Diagnosis not present

## 2021-01-09 DIAGNOSIS — I2510 Atherosclerotic heart disease of native coronary artery without angina pectoris: Secondary | ICD-10-CM | POA: Diagnosis not present

## 2021-01-09 DIAGNOSIS — Z992 Dependence on renal dialysis: Secondary | ICD-10-CM | POA: Diagnosis not present

## 2021-01-09 DIAGNOSIS — Z471 Aftercare following joint replacement surgery: Secondary | ICD-10-CM | POA: Diagnosis not present

## 2021-01-09 DIAGNOSIS — N185 Chronic kidney disease, stage 5: Secondary | ICD-10-CM | POA: Diagnosis not present

## 2021-01-09 DIAGNOSIS — D631 Anemia in chronic kidney disease: Secondary | ICD-10-CM | POA: Diagnosis not present

## 2021-01-09 DIAGNOSIS — J449 Chronic obstructive pulmonary disease, unspecified: Secondary | ICD-10-CM | POA: Diagnosis not present

## 2021-01-09 DIAGNOSIS — E785 Hyperlipidemia, unspecified: Secondary | ICD-10-CM | POA: Diagnosis not present

## 2021-01-09 LAB — COMPREHENSIVE METABOLIC PANEL
ALT: 8 IU/L (ref 0–44)
AST: 9 IU/L (ref 0–40)
Albumin/Globulin Ratio: 2.1 (ref 1.2–2.2)
Albumin: 3.7 g/dL — ABNORMAL LOW (ref 3.8–4.8)
Alkaline Phosphatase: 57 IU/L (ref 44–121)
BUN/Creatinine Ratio: 10 (ref 10–24)
BUN: 63 mg/dL — ABNORMAL HIGH (ref 8–27)
Bilirubin Total: 0.3 mg/dL (ref 0.0–1.2)
CO2: 15 mmol/L — ABNORMAL LOW (ref 20–29)
Calcium: 8.1 mg/dL — ABNORMAL LOW (ref 8.6–10.2)
Chloride: 100 mmol/L (ref 96–106)
Creatinine, Ser: 6.52 mg/dL — ABNORMAL HIGH (ref 0.76–1.27)
Globulin, Total: 1.8 g/dL (ref 1.5–4.5)
Glucose: 77 mg/dL (ref 65–99)
Potassium: 5.2 mmol/L (ref 3.5–5.2)
Sodium: 138 mmol/L (ref 134–144)
Total Protein: 5.5 g/dL — ABNORMAL LOW (ref 6.0–8.5)
eGFR: 9 mL/min/{1.73_m2} — ABNORMAL LOW (ref >59)

## 2021-01-09 LAB — CBC WITH DIFFERENTIAL/PLATELET
Basophils Absolute: 0 10*3/uL (ref 0.0–0.2)
Basos: 0 %
EOS (ABSOLUTE): 0 10*3/uL (ref 0.0–0.4)
Eos: 1 %
Hematocrit: 29.4 % — ABNORMAL LOW (ref 37.5–51.0)
Hemoglobin: 9.4 g/dL — ABNORMAL LOW (ref 13.0–17.7)
Immature Grans (Abs): 0 10*3/uL (ref 0.0–0.1)
Immature Granulocytes: 1 %
Lymphocytes Absolute: 0.4 10*3/uL — ABNORMAL LOW (ref 0.7–3.1)
Lymphs: 9 %
MCH: 29.2 pg (ref 26.6–33.0)
MCHC: 32 g/dL (ref 31.5–35.7)
MCV: 91 fL (ref 79–97)
Monocytes Absolute: 0.2 10*3/uL (ref 0.1–0.9)
Monocytes: 4 %
Neutrophils Absolute: 4.1 10*3/uL (ref 1.4–7.0)
Neutrophils: 85 %
Platelets: 172 10*3/uL (ref 150–450)
RBC: 3.22 x10E6/uL — ABNORMAL LOW (ref 4.14–5.80)
RDW: 15.4 % (ref 11.6–15.4)
WBC: 4.8 10*3/uL (ref 3.4–10.8)

## 2021-01-09 LAB — LIPID PANEL W/O CHOL/HDL RATIO
Cholesterol, Total: 172 mg/dL (ref 100–199)
HDL: 32 mg/dL — ABNORMAL LOW (ref >39)
LDL Chol Calc (NIH): 117 mg/dL — ABNORMAL HIGH (ref 0–99)
Triglycerides: 124 mg/dL (ref 0–149)
VLDL Cholesterol Cal: 23 mg/dL (ref 5–40)

## 2021-01-09 LAB — URIC ACID: Uric Acid: 7.4 mg/dL (ref 3.8–8.4)

## 2021-01-10 DIAGNOSIS — Z992 Dependence on renal dialysis: Secondary | ICD-10-CM | POA: Diagnosis not present

## 2021-01-10 DIAGNOSIS — N186 End stage renal disease: Secondary | ICD-10-CM | POA: Diagnosis not present

## 2021-01-11 DIAGNOSIS — Z992 Dependence on renal dialysis: Secondary | ICD-10-CM | POA: Diagnosis not present

## 2021-01-11 DIAGNOSIS — N186 End stage renal disease: Secondary | ICD-10-CM | POA: Diagnosis not present

## 2021-01-13 DIAGNOSIS — Z992 Dependence on renal dialysis: Secondary | ICD-10-CM | POA: Diagnosis not present

## 2021-01-13 DIAGNOSIS — N186 End stage renal disease: Secondary | ICD-10-CM | POA: Diagnosis not present

## 2021-01-14 ENCOUNTER — Ambulatory Visit (INDEPENDENT_AMBULATORY_CARE_PROVIDER_SITE_OTHER): Payer: Medicare HMO

## 2021-01-14 VITALS — Ht 67.0 in | Wt 151.0 lb

## 2021-01-14 DIAGNOSIS — N185 Chronic kidney disease, stage 5: Secondary | ICD-10-CM | POA: Diagnosis not present

## 2021-01-14 DIAGNOSIS — J449 Chronic obstructive pulmonary disease, unspecified: Secondary | ICD-10-CM | POA: Diagnosis not present

## 2021-01-14 DIAGNOSIS — N186 End stage renal disease: Secondary | ICD-10-CM | POA: Diagnosis not present

## 2021-01-14 DIAGNOSIS — Z471 Aftercare following joint replacement surgery: Secondary | ICD-10-CM | POA: Diagnosis not present

## 2021-01-14 DIAGNOSIS — R1310 Dysphagia, unspecified: Secondary | ICD-10-CM | POA: Diagnosis not present

## 2021-01-14 DIAGNOSIS — E785 Hyperlipidemia, unspecified: Secondary | ICD-10-CM | POA: Diagnosis not present

## 2021-01-14 DIAGNOSIS — Z992 Dependence on renal dialysis: Secondary | ICD-10-CM | POA: Diagnosis not present

## 2021-01-14 DIAGNOSIS — I1311 Hypertensive heart and chronic kidney disease without heart failure, with stage 5 chronic kidney disease, or end stage renal disease: Secondary | ICD-10-CM | POA: Diagnosis not present

## 2021-01-14 DIAGNOSIS — Z0000 Encounter for general adult medical examination without abnormal findings: Secondary | ICD-10-CM | POA: Diagnosis not present

## 2021-01-14 DIAGNOSIS — I2510 Atherosclerotic heart disease of native coronary artery without angina pectoris: Secondary | ICD-10-CM | POA: Diagnosis not present

## 2021-01-14 DIAGNOSIS — M109 Gout, unspecified: Secondary | ICD-10-CM | POA: Diagnosis not present

## 2021-01-14 DIAGNOSIS — D631 Anemia in chronic kidney disease: Secondary | ICD-10-CM | POA: Diagnosis not present

## 2021-01-14 NOTE — Progress Notes (Signed)
I connected with James Arnold today by telephone and verified that I am speaking with the correct person using two identifiers. Location patient: home Location provider: work Persons participating in the virtual visit: Elye Harmsen, Glenna Durand LPN.   I discussed the limitations, risks, security and privacy concerns of performing an evaluation and management service by telephone and the availability of in person appointments. I also discussed with the patient that there may be a patient responsible charge related to this service. The patient expressed understanding and verbally consented to this telephonic visit.    Interactive audio and video telecommunications were attempted between this provider and patient, however failed, due to patient having technical difficulties OR patient did not have access to video capability.  We continued and completed visit with audio only.     Vital signs may be patient reported or missing.  Subjective:   James Arnold is a 65 y.o. male who presents for Medicare Annual/Subsequent preventive examination.  Review of Systems     Cardiac Risk Factors include: advanced age (>68men, >8 women);hypertension;male gender;sedentary lifestyle     Objective:    Today's Vitals   01/14/21 0812 01/14/21 0813  Weight: 151 lb (68.5 kg)   Height: 5\' 7"  (1.702 m)   PainSc:  6    Body mass index is 23.65 kg/m.  Advanced Directives 01/14/2021 12/28/2020 11/29/2020 11/19/2020 11/05/2020 10/18/2020 08/10/2020  Does Patient Have a Medical Advance Directive? Yes Yes Yes Yes Yes No No  Type of Paramedic of [place];Living will [place];Living will [place];Living will [place];Living will Living will;Healthcare Power of Attorney - -  Does patient want to make changes to medical advance directive? - - No - Patient declined No - Patient declined No - Patient declined - -  Copy of [place] in Chart? No - copy requested - No - copy requested No - copy requested No - copy requested - -  Would patient like information on creating a medical advance directive? - - No - Patient declined No - Patient declined No - Patient declined No - Patient declined -    Current Medications (verified) Outpatient Encounter Medications as of 01/14/2021  Medication Sig  . aspirin 81 MG chewable tablet Chew 1 tablet (81 mg total) by mouth daily.  . carvedilol (COREG) 12.5 MG tablet Take 1.5 tablets (18.75 mg total) by mouth 2 (two) times daily.  . Cholecalciferol (VITAMIN D-1000 MAX ST) 25 MCG (1000 UT) tablet Take 1,000 Units by mouth daily.   . citalopram (CELEXA) 40 MG tablet Take 1 tablet (40 mg total) by mouth daily.  [name] Kitchen epoetin alfa-epbx (RETACRIT) 96789 UNIT/ML injection Inject into the skin. Once a month at dialysis  . ferrous sulfate 325 (65 FE) MG tablet Take 1 tablet (325 mg total) by mouth daily with breakfast.  . furosemide (LASIX) 20 MG tablet Take 1 tablet (20 mg total) by mouth daily.  [name] Kitchen gentamicin cream (GARAMYCIN) 0.1 % Apply 1 application topically every other day.  . levETIRAcetam (KEPPRA) 500 MG tablet Take 1 tablet (500 mg total) by mouth at bedtime.  [name] Kitchen omeprazole (PRILOSEC) 20 MG capsule Take 1 capsule (20 mg total) by mouth daily.  . ondansetron (ZOFRAN ODT) 8 MG disintegrating tablet Take 1 tablet (8 mg total) by mouth every 8 (eight) hours as needed for nausea or vomiting.  . polyethylene glycol (MIRALAX / GLYCOLAX) 17 g packet Take 17 g by mouth daily as  needed.  . [DISCONTINUED] acetaminophen (TYLENOL) 500 MG tablet Take 1,000 mg by mouth every 6 (six) hours as needed for moderate pain.  . [DISCONTINUED] acyclovir (ZOVIRAX) 200 MG capsule Take 1 capsule (200 mg total) by mouth 2 (two) times daily. (Patient not taking: Reported on 08/10/2020)  . [DISCONTINUED] amLODipine (NORVASC) 10 MG tablet Take 1 tablet (10 mg total) by mouth daily.  . [DISCONTINUED] aspirin EC 81  MG tablet Take 81 mg by mouth daily.  . [DISCONTINUED] carvedilol (COREG) 12.5 MG tablet Take 1 tablet (12.5 mg total) by mouth 2 (two) times daily.  . [DISCONTINUED] citalopram (CELEXA) 40 MG tablet Take 1 tablet (40 mg total) by mouth daily.  . [DISCONTINUED] furosemide (LASIX) 20 MG tablet Take 1 tablet (20 mg total) by mouth daily.  . [DISCONTINUED] losartan (COZAAR) 50 MG tablet Take 1 tablet (50 mg total) by mouth daily.  . [DISCONTINUED] predniSONE (DELTASONE) 10 MG tablet Take 10 mg by mouth daily with breakfast. (Patient not taking: Reported on 01/08/2021)  . [DISCONTINUED] ruxolitinib phosphate (JAKAFI) 10 MG tablet Take 1 tablet (10 mg total) by mouth daily. (Patient not taking: Reported on 04/06/2020)  . [DISCONTINUED] traMADol (ULTRAM) 50 MG tablet Take 1 tablet (50 mg total) by mouth every 8 (eight) hours as needed.   No facility-administered encounter medications on file as of 01/14/2021.    Allergies (verified) Patient has no known allergies.   History: Past Medical History:  Diagnosis Date  . Anemia   . Benign hypertensive renal disease   . CAD (coronary artery disease)    a. 08/1996 s/p BMS to the mLAD ([facility]); b. 12/2017 MV: EF 46%, fixed apical defect w/ significant GI uptake artifact. No ischemia-> low risk.  . CKD (chronic kidney disease), stage V ([facility])    on peritoneal dialysis  . Complication of anesthesia    please call by "[name]" when waking up!!  . COPD (chronic obstructive pulmonary disease) ([place])    centrilobular emphysema. pt is poorly controlled with his copd/smoking.  . Depression   . Diastolic dysfunction    a. 12/2017 Echo: EF 60-65%, no rwma, Gr1 DD, mild AI/MR. Nl RVSP.  [name] Kitchen Dyspnea    with exertion  . Fatigue   . FSGS (focal segmental glomerulosclerosis)   . Hyperlipidemia 10/2002  . Hypertension   . myelofibrosis    bone marrow failure  . 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 Myocardial infarction ([place]) 1997   stent x 1  . Smoker   .  Thrombocytopenia ([place])    Past Surgical History:  Procedure Laterality Date  . ANGIOPLASTY    . BONE MARROW ASPIRATION  03/2009  . CARDIAC CATHETERIZATION  1997   stent x 1  . COLONOSCOPY WITH PROPOFOL N/A 08/09/2018   Procedure: COLONOSCOPY WITH PROPOFOL;  Surgeon: [name], MD;  Location: [facility] ENDOSCOPY;  Service: Gastroenterology;  Laterality: N/A;  . CORONARY STENT PLACEMENT  08/1996  . EXTERIORIZATION OF A CONTINUOUS AMBULATORY PERITONEAL DIALYSIS CATHETER    . EYE SURGERY Bilateral    cats removed  . REVERSE SHOULDER ARTHROPLASTY Left 11/19/2020   Procedure: Left reverse shoulder arthroplasty;  Surgeon: Leim Fabry, MD;  Location: [facility] ORS;  Service: Orthopedics;  Laterality: Left;   Family History  Problem Relation Age of Onset  . Stroke Mother        Low BP stroke  . Depression Father   . Depression Sister        Breast  . Heart disease Other   .  Breast cancer Other   . Lung cancer Other   . Ovarian cancer Other   . Stomach cancer Other    Social History   Socioeconomic History  . Marital status: Married    Spouse name: Jackelyn Poling  . Number of children: Not on file  . Years of education: Not on file  . Highest education level: Not on file  Occupational History  . 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 Smoking status: Former Smoker    Packs/day: 0.75    Years: 47.00    Pack years: 35.25    Types: Cigarettes  . Smokeless tobacco: Never Used  . Tobacco comment: trying to cut back  Vaping Use  . Vaping Use: Never used  Substance and Sexual Activity  . Alcohol use: Not Currently    Comment: rare  . Drug use: No  . Sexual activity: Yes  Other Topics Concern  . Not on file  Social History Narrative   Patient lives with wife in his home.   Not working dt disability.    Has been placed on kidney transplant evaluation list, but does not qualify d/t smoking and myelofibrosis   Social Determinants of Health   Financial  Resource Strain: Low Risk   . Difficulty of Paying Living Expenses: Not hard at all  Food Insecurity: No Food Insecurity  . Worried About Charity fundraiser in the Last Year: Never true  . Ran Out of Food in the Last Year: Never true  Transportation Needs: No Transportation Needs  . Lack of Transportation (Medical): No  . Lack of Transportation (Non-Medical): No  Physical Activity: Inactive  . Days of Exercise per Week: 0 days  . Minutes of Exercise per Session: 0 min  Stress: No Stress Concern Present  . Feeling of Stress : Not at all  Social Connections: Not on file    Tobacco Counseling Counseling given: Not Answered Comment: trying to cut back   Clinical Intake:  Pre-visit preparation completed: Yes  Pain : 0-10 Pain Score: 6  Pain Type: Acute pain Pain Location: Arm Pain Orientation: Left Pain Descriptors / Indicators: Sore Pain Frequency: Intermittent     Nutritional Status: BMI of 19-24  Normal Nutritional Risks: Nausea/ vomitting/ diarrhea (diarrhea may be due to something he ate) Diabetes: No  How often do you need to have someone help you when you read instructions, pamphlets, or other written materials from your doctor or pharmacy?: 1 - Never What is the last grade level you completed in school?: 12th grade  Diabetic? no  Interpreter Needed?: No  Information entered by :: NAllen LPN   Activities of Daily Living In your present state of health, do you have any difficulty performing the following activities: 01/14/2021 11/05/2020  Hearing? N N  Vision? N N  Difficulty concentrating or making decisions? N N  Walking or climbing stairs? N N  Dressing or bathing? N N  Doing errands, shopping? N Y  Comment - difficulty driving d/t pain from shoulder  Preparing Food and eating ? N -  Using the Toilet? N -  In the past six months, have you accidently leaked urine? N -  Do you have problems with loss of bowel control? N -  Managing your Medications? Y -   Comment wife sets up -  Managing your Finances? N -  Some recent data might be hidden    Patient Care Team: Valerie Roys, DO as PCP -  General (Family Medicine) End, Harrell Gave, MD as PCP - Cardiology (Cardiology) Cammie Sickle, MD as Consulting Physician (Internal Medicine)  Indicate any recent Medical Services you may have received from other than [facility] providers in the past year (date may be approximate).     Assessment:   This is a routine wellness examination for James Arnold.  Hearing/Vision screen No exam data present  Dietary issues and exercise activities discussed:    Goals    . Increase water intake     Recommend drinking at least 2-3 glasses of water a day.    . Patient Stated     01/14/2021, no goals      Depression Screen PHQ 2/9 Scores 01/14/2021 11/16/2020 10/23/2020 07/10/2020 05/03/2020 01/09/2020 01/09/2020  PHQ - 2 Score 0 0 0 0 1 0 0  PHQ- 9 Score - 3 3 0 2 0 -    Fall Risk Fall Risk  01/14/2021 11/16/2020 07/10/2020 01/09/2020 09/13/2019  Falls in the past year? 0 0 0 0 0  Number falls in past yr: - - 0 0 0  Injury with Fall? - 0 0 0 0  Risk for fall due to : History of fall(s);Medication side effect - No Fall Risks - -  Follow up Falls evaluation completed;Education provided;Falls prevention discussed - Falls evaluation completed - -    FALL RISK PREVENTION PERTAINING TO THE HOME:  Any stairs in or around the home? Yes  If so, are there any without handrails? Yes  Home free of loose throw rugs in walkways, pet beds, electrical cords, etc? Yes  Adequate lighting in your home to reduce risk of falls? Yes   ASSISTIVE DEVICES UTILIZED TO PREVENT FALLS:  Life alert? No  Use of a cane, walker or w/c? No  Grab bars in the bathroom? Yes  Shower chair or bench in shower? Yes  Elevated toilet seat or a handicapped toilet? No   TIMED UP AND GO:  Was the test performed? No   Cognitive Function:     6CIT Screen 01/14/2021 01/09/2020 07/13/2018  02/18/2017  What Year? 0 points 0 points 0 points 0 points  What month? 0 points 0 points 0 points 0 points  What time? 0 points 0 points 0 points 0 points  Count back from 20 0 points 0 points 0 points 0 points  Months in reverse 0 points 0 points 0 points 0 points  Repeat phrase 4 points 0 points 0 points 2 points  Total Score 4 0 0 2    Immunizations Immunization History  Administered Date(s) Administered  . Hepatitis B, adult 01/24/2020, 02/21/2020, 03/27/2020, 08/14/2020  . Influenza-Unspecified 06/23/2019, 07/19/2019, 06/26/2020, 07/17/2020  . PPD Test 11/18/2018, 03/07/2019, 02/21/2020  . Pneumococcal Polysaccharide-23 07/26/2014, 05/02/2019  . Tdap 07/26/2014    TDAP status: Up to date  Flu Vaccine status: Up to date  Pneumococcal vaccine status: Up to date  Covid-19 vaccine status: Declined, Education has been provided regarding the importance of this vaccine but patient still declined. Advised may receive this vaccine at local pharmacy or Health Dept.or vaccine clinic. Aware to provide a copy of the vaccination record if obtained from local pharmacy or Health Dept. Verbalized acceptance and understanding.  Qualifies for Shingles Vaccine? Yes   Zostavax completed No   Shingrix Completed?: No.    Education has been provided regarding the importance of this vaccine. Patient has been advised to call insurance company to determine out of pocket expense if they have not yet received this vaccine. Advised  may also receive vaccine at local pharmacy or Health Dept. Verbalized acceptance and understanding.  Screening Tests Health Maintenance  Topic Date Due  . COVID-19 Vaccine (1) Never done  . INFLUENZA VACCINE  04/22/2021  . TETANUS/TDAP  07/26/2024  . COLONOSCOPY (Pts 45-23yrs Insurance coverage will need to be confirmed)  08/09/2028  . HIV Screening  Completed  . Hepatitis C Screening  Addressed  . HPV VACCINES  Aged Out    Health Maintenance  Health Maintenance Due   Topic Date Due  . COVID-19 Vaccine (1) Never done    Colorectal cancer screening: Type of screening: Colonoscopy. Completed 08/09/2018. Repeat every 10 years  Lung Cancer Screening: (Low Dose CT Chest recommended if Age 28-80 years, 30 pack-year currently smoking OR have quit w/in 15years.) does not qualify.   Lung Cancer Screening Referral: no   Additional Screening:  Hepatitis C Screening: does qualify; Completed 07/26/2014  Vision Screening: Recommended annual ophthalmology exams for early detection of glaucoma and other disorders of the eye. Is the patient up to date with their annual eye exam?  Yes  Who is the provider or what is the name of the office in which the patient attends annual eye exams? Can't remember If pt is not established with a provider, would they like to be referred to a provider to establish care? No .   Dental Screening: Recommended annual dental exams for proper oral hygiene  Community Resource Referral / Chronic Care Management: CRR required this visit?  No   CCM required this visit?  No      Plan:     I have personally reviewed and noted the following in the patient's chart:   . Medical and social history . Use of alcohol, tobacco or illicit drugs  . Current medications and supplements . Functional ability and status . Nutritional status . Physical activity . Advanced directives . List of other physicians . Hospitalizations, surgeries, and ER visits in previous 12 months . Vitals . Screenings to include cognitive, depression, and falls . Referrals and appointments  In addition, I have reviewed and discussed with patient certain preventive protocols, quality metrics, and best practice recommendations. A written personalized care plan for preventive services as well as general preventive health recommendations were provided to patient.     Kellie Simmering, LPN   [id number]   Nurse Notes:

## 2021-01-14 NOTE — Patient Instructions (Signed)
James Arnold , Thank you for taking time to come for your Medicare Wellness Visit. I appreciate your ongoing commitment to your health goals. Please review the following plan we discussed and let me know if I can assist you in the future.   Screening recommendations/referrals: Colonoscopy: completed 08/09/2018 Recommended yearly ophthalmology/optometry visit for glaucoma screening and checkup Recommended yearly dental visit for hygiene and checkup  Vaccinations: Influenza vaccine: completed 07/17/2020, due 04/22/2021 Pneumococcal vaccine: completed 05/02/2019 Tdap vaccine: completed 07/26/2014, due 07/26/2024 Shingles vaccine: discussed   Covid-19:  decline  Advanced directives: Please bring a copy of your POA (Power of Attorney) and/or Living Will to your next appointment.   Conditions/risks identified: none  Next appointment: Follow up in one year for your annual wellness visit.   Preventive Care 65 Years and Older, Male Preventive care refers to lifestyle choices and visits with your health care provider that can promote health and wellness. What does preventive care include?  A yearly physical exam. This is also called an annual well check.  Dental exams once or twice a year.  Routine eye exams. Ask your health care provider how often you should have your eyes checked.  Personal lifestyle choices, including:  Daily care of your teeth and gums.  Regular physical activity.  Eating a healthy diet.  Avoiding tobacco and drug use.  Limiting alcohol use.  Practicing safe sex.  Taking low doses of aspirin every day.  Taking vitamin and mineral supplements as recommended by your health care provider. What happens during an annual well check? The services and screenings done by your health care provider during your annual well check will depend on your age, overall health, lifestyle risk factors, and family history of disease. Counseling  Your health care provider may ask you  questions about your:  Alcohol use.  Tobacco use.  Drug use.  Emotional well-being.  Home and relationship well-being.  Sexual activity.  Eating habits.  History of falls.  Memory and ability to understand (cognition).  Work and work Statistician. Screening  You may have the following tests or measurements:  Height, weight, and BMI.  Blood pressure.  Lipid and cholesterol levels. These may be checked every 5 years, or more frequently if you are over 41 years old.  Skin check.  Lung cancer screening. You may have this screening every year starting at age 13 if you have a 30-pack-year history of smoking and currently smoke or have quit within the past 15 years.  Fecal occult blood test (FOBT) of the stool. You may have this test every year starting at age 17.  Flexible sigmoidoscopy or colonoscopy. You may have a sigmoidoscopy every 5 years or a colonoscopy every 10 years starting at age 60.  Prostate cancer screening. Recommendations will vary depending on your family history and other risks.  Hepatitis C blood test.  Hepatitis B blood test.  Sexually transmitted disease (STD) testing.  Diabetes screening. This is done by checking your blood sugar (glucose) after you have not eaten for a while (fasting). You may have this done every 1-3 years.  Abdominal aortic aneurysm (AAA) screening. You may need this if you are a current or former smoker.  Osteoporosis. You may be screened starting at age 59 if you are at high risk. Talk with your health care provider about your test results, treatment options, and if necessary, the need for more tests. Vaccines  Your health care provider may recommend certain vaccines, such as:  Influenza vaccine. This is recommended  every year.  Tetanus, diphtheria, and acellular pertussis (Tdap, Td) vaccine. You may need a Td booster every 10 years.  Zoster vaccine. You may need this after age 39.  Pneumococcal 13-valent conjugate  (PCV13) vaccine. One dose is recommended after age 16.  Pneumococcal polysaccharide (PPSV23) vaccine. One dose is recommended after age 70. Talk to your health care provider about which screenings and vaccines you need and how often you need them. This information is not intended to replace advice given to you by your health care provider. Make sure you discuss any questions you have with your health care provider. Document Released: 10/05/2015 Document Revised: 05/28/2016 Document Reviewed: 07/10/2015 Elsevier Interactive Patient Education  2017 [place] Prevention in the Home Falls can cause injuries. They can happen to people of all ages. There are many things you can do to make your home safe and to help prevent falls. What can I do on the outside of my home?  Regularly fix the edges of walkways and driveways and fix any cracks.  Remove anything that might make you trip as you walk through a door, such as a raised step or threshold.  Trim any bushes or trees on the path to your home.  Use bright outdoor lighting.  Clear any walking paths of anything that might make someone trip, such as rocks or tools.  Regularly check to see if handrails are loose or broken. Make sure that both sides of any steps have handrails.  Any raised decks and porches should have guardrails on the edges.  Have any leaves, snow, or ice cleared regularly.  Use sand or salt on walking paths during winter.  Clean up any spills in your garage right away. This includes oil or grease spills. What can I do in the bathroom?  Use night lights.  Install grab bars by the toilet and in the tub and shower. Do not use towel bars as grab bars.  Use non-skid mats or decals in the tub or shower.  If you need to sit down in the shower, use a plastic, non-slip stool.  Keep the floor dry. Clean up any water that spills on the floor as soon as it happens.  Remove soap buildup in the tub or shower  regularly.  Attach bath mats securely with double-sided non-slip rug tape.  Do not have throw rugs and other things on the floor that can make you trip. What can I do in the bedroom?  Use night lights.  Make sure that you have a light by your bed that is easy to reach.  Do not use any sheets or blankets that are too big for your bed. They should not hang down onto the floor.  Have a firm chair that has side arms. You can use this for support while you get dressed.  Do not have throw rugs and other things on the floor that can make you trip. What can I do in the kitchen?  Clean up any spills right away.  Avoid walking on wet floors.  Keep items that you use a lot in easy-to-reach places.  If you need to reach something above you, use a strong step stool that has a grab bar.  Keep electrical cords out of the way.  Do not use floor polish or wax that makes floors slippery. If you must use wax, use non-skid floor wax.  Do not have throw rugs and other things on the floor that can make you trip. What  can I do with my stairs?  Do not leave any items on the stairs.  Make sure that there are handrails on both sides of the stairs and use them. Fix handrails that are broken or loose. Make sure that handrails are as long as the stairways.  Check any carpeting to make sure that it is firmly attached to the stairs. Fix any carpet that is loose or worn.  Avoid having throw rugs at the top or bottom of the stairs. If you do have throw rugs, attach them to the floor with carpet tape.  Make sure that you have a light switch at the top of the stairs and the bottom of the stairs. If you do not have them, ask someone to add them for you. What else can I do to help prevent falls?  Wear shoes that:  Do not have high heels.  Have rubber bottoms.  Are comfortable and fit you well.  Are closed at the toe. Do not wear sandals.  If you use a stepladder:  Make sure that it is fully  opened. Do not climb a closed stepladder.  Make sure that both sides of the stepladder are locked into place.  Ask someone to hold it for you, if possible.  Clearly mark and make sure that you can see:  Any grab bars or handrails.  First and last steps.  Where the edge of each step is.  Use tools that help you move around (mobility aids) if they are needed. These include:  Canes.  Walkers.  Scooters.  Crutches.  Turn on the lights when you go into a dark area. Replace any light bulbs as soon as they burn out.  Set up your furniture so you have a clear path. Avoid moving your furniture around.  If any of your floors are uneven, fix them.  If there are any pets around you, be aware of where they are.  Review your medicines with your doctor. Some medicines can make you feel dizzy. This can increase your chance of falling. Ask your doctor what other things that you can do to help prevent falls. This information is not intended to replace advice given to you by your health care provider. Make sure you discuss any questions you have with your health care provider. Document Released: 07/05/2009 Document Revised: 02/14/2016 Document Reviewed: 10/13/2014 Elsevier Interactive Patient Education  2017 [employer].

## 2021-01-15 DIAGNOSIS — N186 End stage renal disease: Secondary | ICD-10-CM | POA: Diagnosis not present

## 2021-01-15 DIAGNOSIS — Z992 Dependence on renal dialysis: Secondary | ICD-10-CM | POA: Diagnosis not present

## 2021-01-16 DIAGNOSIS — E785 Hyperlipidemia, unspecified: Secondary | ICD-10-CM | POA: Diagnosis not present

## 2021-01-16 DIAGNOSIS — R1310 Dysphagia, unspecified: Secondary | ICD-10-CM | POA: Diagnosis not present

## 2021-01-16 DIAGNOSIS — Z992 Dependence on renal dialysis: Secondary | ICD-10-CM | POA: Diagnosis not present

## 2021-01-16 DIAGNOSIS — N186 End stage renal disease: Secondary | ICD-10-CM | POA: Diagnosis not present

## 2021-01-16 DIAGNOSIS — I2510 Atherosclerotic heart disease of native coronary artery without angina pectoris: Secondary | ICD-10-CM | POA: Diagnosis not present

## 2021-01-16 DIAGNOSIS — I1311 Hypertensive heart and chronic kidney disease without heart failure, with stage 5 chronic kidney disease, or end stage renal disease: Secondary | ICD-10-CM | POA: Diagnosis not present

## 2021-01-16 DIAGNOSIS — J449 Chronic obstructive pulmonary disease, unspecified: Secondary | ICD-10-CM | POA: Diagnosis not present

## 2021-01-16 DIAGNOSIS — N185 Chronic kidney disease, stage 5: Secondary | ICD-10-CM | POA: Diagnosis not present

## 2021-01-16 DIAGNOSIS — D631 Anemia in chronic kidney disease: Secondary | ICD-10-CM | POA: Diagnosis not present

## 2021-01-16 DIAGNOSIS — Z471 Aftercare following joint replacement surgery: Secondary | ICD-10-CM | POA: Diagnosis not present

## 2021-01-16 DIAGNOSIS — M109 Gout, unspecified: Secondary | ICD-10-CM | POA: Diagnosis not present

## 2021-01-17 DIAGNOSIS — N186 End stage renal disease: Secondary | ICD-10-CM | POA: Diagnosis not present

## 2021-01-17 DIAGNOSIS — Z992 Dependence on renal dialysis: Secondary | ICD-10-CM | POA: Diagnosis not present

## 2021-01-18 DIAGNOSIS — N186 End stage renal disease: Secondary | ICD-10-CM | POA: Diagnosis not present

## 2021-01-18 DIAGNOSIS — Z992 Dependence on renal dialysis: Secondary | ICD-10-CM | POA: Diagnosis not present

## 2021-01-19 ENCOUNTER — Other Ambulatory Visit: Payer: Self-pay | Admitting: Family Medicine

## 2021-01-19 DIAGNOSIS — Z992 Dependence on renal dialysis: Secondary | ICD-10-CM | POA: Diagnosis not present

## 2021-01-19 DIAGNOSIS — N186 End stage renal disease: Secondary | ICD-10-CM | POA: Diagnosis not present

## 2021-01-20 DIAGNOSIS — N186 End stage renal disease: Secondary | ICD-10-CM | POA: Diagnosis not present

## 2021-01-20 DIAGNOSIS — Z992 Dependence on renal dialysis: Secondary | ICD-10-CM | POA: Diagnosis not present

## 2021-01-21 DIAGNOSIS — N186 End stage renal disease: Secondary | ICD-10-CM | POA: Diagnosis not present

## 2021-01-21 DIAGNOSIS — N185 Chronic kidney disease, stage 5: Secondary | ICD-10-CM | POA: Diagnosis not present

## 2021-01-21 DIAGNOSIS — R1310 Dysphagia, unspecified: Secondary | ICD-10-CM | POA: Diagnosis not present

## 2021-01-21 DIAGNOSIS — I1311 Hypertensive heart and chronic kidney disease without heart failure, with stage 5 chronic kidney disease, or end stage renal disease: Secondary | ICD-10-CM | POA: Diagnosis not present

## 2021-01-21 DIAGNOSIS — I2510 Atherosclerotic heart disease of native coronary artery without angina pectoris: Secondary | ICD-10-CM | POA: Diagnosis not present

## 2021-01-21 DIAGNOSIS — D631 Anemia in chronic kidney disease: Secondary | ICD-10-CM | POA: Diagnosis not present

## 2021-01-21 DIAGNOSIS — J449 Chronic obstructive pulmonary disease, unspecified: Secondary | ICD-10-CM | POA: Diagnosis not present

## 2021-01-21 DIAGNOSIS — M109 Gout, unspecified: Secondary | ICD-10-CM | POA: Diagnosis not present

## 2021-01-21 DIAGNOSIS — Z992 Dependence on renal dialysis: Secondary | ICD-10-CM | POA: Diagnosis not present

## 2021-01-21 DIAGNOSIS — Z471 Aftercare following joint replacement surgery: Secondary | ICD-10-CM | POA: Diagnosis not present

## 2021-01-21 DIAGNOSIS — E785 Hyperlipidemia, unspecified: Secondary | ICD-10-CM | POA: Diagnosis not present

## 2021-01-22 DIAGNOSIS — Z992 Dependence on renal dialysis: Secondary | ICD-10-CM | POA: Diagnosis not present

## 2021-01-22 DIAGNOSIS — N186 End stage renal disease: Secondary | ICD-10-CM | POA: Diagnosis not present

## 2021-01-23 ENCOUNTER — Other Ambulatory Visit: Payer: Self-pay

## 2021-01-23 ENCOUNTER — Inpatient Hospital Stay (HOSPITAL_BASED_OUTPATIENT_CLINIC_OR_DEPARTMENT_OTHER): Payer: Medicare HMO | Admitting: Internal Medicine

## 2021-01-23 ENCOUNTER — Inpatient Hospital Stay: Payer: Medicare HMO | Attending: Internal Medicine

## 2021-01-23 ENCOUNTER — Encounter: Payer: Self-pay | Admitting: Internal Medicine

## 2021-01-23 ENCOUNTER — Inpatient Hospital Stay: Payer: Medicare HMO

## 2021-01-23 VITALS — BP 149/69 | HR 75 | Temp 98.2°F | Resp 20 | Ht 67.0 in | Wt 154.0 lb

## 2021-01-23 DIAGNOSIS — Z801 Family history of malignant neoplasm of trachea, bronchus and lung: Secondary | ICD-10-CM | POA: Diagnosis not present

## 2021-01-23 DIAGNOSIS — D631 Anemia in chronic kidney disease: Secondary | ICD-10-CM | POA: Diagnosis not present

## 2021-01-23 DIAGNOSIS — M109 Gout, unspecified: Secondary | ICD-10-CM | POA: Diagnosis not present

## 2021-01-23 DIAGNOSIS — I1311 Hypertensive heart and chronic kidney disease without heart failure, with stage 5 chronic kidney disease, or end stage renal disease: Secondary | ICD-10-CM | POA: Diagnosis not present

## 2021-01-23 DIAGNOSIS — R634 Abnormal weight loss: Secondary | ICD-10-CM | POA: Insufficient documentation

## 2021-01-23 DIAGNOSIS — Z8673 Personal history of transient ischemic attack (TIA), and cerebral infarction without residual deficits: Secondary | ICD-10-CM | POA: Diagnosis not present

## 2021-01-23 DIAGNOSIS — Z471 Aftercare following joint replacement surgery: Secondary | ICD-10-CM | POA: Diagnosis not present

## 2021-01-23 DIAGNOSIS — Z96612 Presence of left artificial shoulder joint: Secondary | ICD-10-CM | POA: Insufficient documentation

## 2021-01-23 DIAGNOSIS — N186 End stage renal disease: Secondary | ICD-10-CM | POA: Diagnosis not present

## 2021-01-23 DIAGNOSIS — Z7902 Long term (current) use of antithrombotics/antiplatelets: Secondary | ICD-10-CM | POA: Diagnosis not present

## 2021-01-23 DIAGNOSIS — E785 Hyperlipidemia, unspecified: Secondary | ICD-10-CM | POA: Diagnosis not present

## 2021-01-23 DIAGNOSIS — R161 Splenomegaly, not elsewhere classified: Secondary | ICD-10-CM | POA: Insufficient documentation

## 2021-01-23 DIAGNOSIS — D471 Chronic myeloproliferative disease: Secondary | ICD-10-CM | POA: Insufficient documentation

## 2021-01-23 DIAGNOSIS — R1310 Dysphagia, unspecified: Secondary | ICD-10-CM | POA: Diagnosis not present

## 2021-01-23 DIAGNOSIS — I2510 Atherosclerotic heart disease of native coronary artery without angina pectoris: Secondary | ICD-10-CM | POA: Diagnosis not present

## 2021-01-23 DIAGNOSIS — Z803 Family history of malignant neoplasm of breast: Secondary | ICD-10-CM | POA: Diagnosis not present

## 2021-01-23 DIAGNOSIS — Z992 Dependence on renal dialysis: Secondary | ICD-10-CM | POA: Insufficient documentation

## 2021-01-23 DIAGNOSIS — N185 Chronic kidney disease, stage 5: Secondary | ICD-10-CM | POA: Diagnosis not present

## 2021-01-23 DIAGNOSIS — J449 Chronic obstructive pulmonary disease, unspecified: Secondary | ICD-10-CM | POA: Diagnosis not present

## 2021-01-23 LAB — CBC WITH DIFFERENTIAL/PLATELET
Abs Immature Granulocytes: 0.03 10*3/uL (ref 0.00–0.07)
Basophils Absolute: 0 10*3/uL (ref 0.0–0.1)
Basophils Relative: 0 %
Eosinophils Absolute: 0 10*3/uL (ref 0.0–0.5)
Eosinophils Relative: 0 %
HCT: 28.9 % — ABNORMAL LOW (ref 39.0–52.0)
Hemoglobin: 9.4 g/dL — ABNORMAL LOW (ref 13.0–17.0)
Immature Granulocytes: 1 %
Lymphocytes Relative: 5 %
Lymphs Abs: 0.3 10*3/uL — ABNORMAL LOW (ref 0.7–4.0)
MCH: 29.5 pg (ref 26.0–34.0)
MCHC: 32.5 g/dL (ref 30.0–36.0)
MCV: 90.6 fL (ref 80.0–100.0)
Monocytes Absolute: 0.2 10*3/uL (ref 0.1–1.0)
Monocytes Relative: 4 %
Neutro Abs: 4.9 10*3/uL (ref 1.7–7.7)
Neutrophils Relative %: 90 %
Platelets: 145 10*3/uL — ABNORMAL LOW (ref 150–400)
RBC: 3.19 MIL/uL — ABNORMAL LOW (ref 4.22–5.81)
RDW: 14.9 % (ref 11.5–15.5)
WBC: 5.4 10*3/uL (ref 4.0–10.5)
nRBC: 0 % (ref 0.0–0.2)

## 2021-01-23 LAB — COMPREHENSIVE METABOLIC PANEL
ALT: 11 U/L (ref 0–44)
AST: 13 U/L — ABNORMAL LOW (ref 15–41)
Albumin: 3.1 g/dL — ABNORMAL LOW (ref 3.5–5.0)
Alkaline Phosphatase: 45 U/L (ref 38–126)
Anion gap: 11 (ref 5–15)
BUN: 51 mg/dL — ABNORMAL HIGH (ref 8–23)
CO2: 23 mmol/L (ref 22–32)
Calcium: 7.9 mg/dL — ABNORMAL LOW (ref 8.9–10.3)
Chloride: 105 mmol/L (ref 98–111)
Creatinine, Ser: 6.43 mg/dL — ABNORMAL HIGH (ref 0.61–1.24)
GFR, Estimated: 9 mL/min — ABNORMAL LOW (ref >60)
Glucose, Bld: 130 mg/dL — ABNORMAL HIGH (ref 70–99)
Potassium: 4.5 mmol/L (ref 3.5–5.1)
Sodium: 139 mmol/L (ref 135–145)
Total Bilirubin: 0.5 mg/dL (ref 0.3–1.2)
Total Protein: 5.9 g/dL — ABNORMAL LOW (ref 6.5–8.1)

## 2021-01-23 LAB — SAMPLE TO BLOOD BANK

## 2021-01-23 LAB — LACTATE DEHYDROGENASE: LDH: 572 U/L — ABNORMAL HIGH (ref 98–192)

## 2021-01-23 NOTE — Progress Notes (Signed)
[place] [facility] OFFICE PROGRESS NOTE  Patient Care Team: Valerie Roys, DO as PCP - General (Family Medicine) End, Harrell Gave, MD as PCP - Cardiology (Cardiology) Cammie Sickle, MD as Consulting Physician (Internal Medicine)  Cancer Staging No matching staging information was found for the patient.   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   # AUGUST 1st week 2019- Retacrit   # October 2nd 2019- jakafi 10 BID [? Renal involvement]; September 2020-peritoneal dialysis; Jakafi 10 mg in the morning; STOPPED/tapered [name], 2021 [sec intolerance fatigue/myalgias]  # May 4th 2022- START [name] dosing/PD]  # CKD 1.5; poorly controlled HTN; AUG 2018- worsening of renal function Creat ~2.1; AUG 2018- Bil Kidney US- NEG for hydronephrosis. [name]- kidney Bx- FSG [ on Prednisone Dr.[name]]; July 2020-peritoneal dialysis;   # hx of Lung nodules- resolved [[name]]/quit smoking.  DIAGNOSIS: PRIMARY MYELOFIBROSIS  RISK:LOW     ;GOALS: Control  CURRENT/MOST RECENT THERAPY : Surveillance    Primary myelofibrosis ([place])      INTERVAL HISTORY:  James Arnold 65 y.o.  male pleasant patient above history of primary myelofibrosis Jak 2+ currently OFF Jakafi [since July 2021sec to intol]; chronic kidney disease on peritoneal dialysis is here for follow-up.  Patient states he continues to have weakness of his left upper and lower extremities.  However improving.  He continues to be on peritoneal dialysis at home.  He is getting EPO/IV iron with nephrology.  Patient has not had any further seizures or strokes.   Continues to complain of his left shoulder pain.  Patient has not started [facility] as recommended 2 weeks ago.  Review of Systems   Constitutional: Positive for malaise/fatigue and weight loss. Negative for chills, diaphoresis and fever.  HENT: Negative for nosebleeds and sore throat.   Eyes: Negative for double vision.  Respiratory: Positive for cough and shortness of breath. Negative for hemoptysis, sputum production and wheezing.   Cardiovascular: Negative for chest pain, palpitations and orthopnea.  Gastrointestinal: Negative for abdominal pain, blood in stool, constipation, diarrhea, heartburn, melena, nausea and vomiting.  Genitourinary: Negative for dysuria, frequency and urgency.  Musculoskeletal: Positive for back pain and joint pain.  Skin: Negative.  Negative for itching and rash.  Neurological: Negative for dizziness, tingling, focal weakness, weakness and headaches.  Endo/Heme/Allergies: Does not bruise/bleed easily.  Psychiatric/Behavioral: Negative for depression. The patient is not nervous/anxious and does not have insomnia.       PAST MEDICAL HISTORY :  Past Medical History:  Diagnosis Date  . Anemia   . Benign hypertensive renal disease   . CAD (coronary artery disease)    a. 08/1996 s/p BMS to the mLAD ([facility]); b. 12/2017 MV: EF 46%, fixed apical defect w/ significant GI uptake artifact. No ischemia-> low risk.  . CKD (chronic kidney disease), stage V ([place])    on peritoneal dialysis  . Complication of anesthesia    please call by "[name]" when waking up!!  . COPD (chronic obstructive pulmonary disease) ([place])    centrilobular emphysema. pt is poorly controlled with his copd/smoking.  . Depression   . Diastolic dysfunction    a. 12/2017 Echo: EF 60-65%, no rwma, Gr1 DD, mild AI/MR. Nl RVSP.  [name] Kitchen Dyspnea    with exertion  . Fatigue   . FSGS (focal segmental glomerulosclerosis)   . Hyperlipidemia 10/2002  .  Hypertension   . myelofibrosis    bone marrow failure  . Myelofibrosis ([place]) 2010   JAK2 (+) primary  . Myocardial infarction ([place]) 1997   stent x 1  . Smoker   . Thrombocytopenia ([place])      PAST SURGICAL HISTORY :   Past Surgical History:  Procedure Laterality Date  . ANGIOPLASTY    . BONE MARROW ASPIRATION  03/2009  . CARDIAC CATHETERIZATION  1997   stent x 1  . COLONOSCOPY WITH PROPOFOL N/A 08/09/2018   Procedure: COLONOSCOPY WITH PROPOFOL;  Surgeon: [name], MD;  Location: [facility] ENDOSCOPY;  Service: Gastroenterology;  Laterality: N/A;  . CORONARY STENT PLACEMENT  08/1996  . EXTERIORIZATION OF A CONTINUOUS AMBULATORY PERITONEAL DIALYSIS CATHETER    . EYE SURGERY Bilateral    cats removed  . REVERSE SHOULDER ARTHROPLASTY Left 11/19/2020   Procedure: Left reverse shoulder arthroplasty;  Surgeon: Leim Fabry, MD;  Location: [facility] ORS;  Service: Orthopedics;  Laterality: Left;    FAMILY HISTORY :   Family History  Problem Relation Age of Onset  . Stroke Mother        Low BP stroke  . Depression Father   . Depression Sister        Breast  . Heart disease Other   . Breast cancer Other   . Lung cancer Other   . Ovarian cancer Other   . Stomach cancer Other     SOCIAL HISTORY:   Social History   Tobacco Use  . Smoking status: Former Smoker    Packs/day: 0.75    Years: 47.00    Pack years: 35.25    Types: Cigarettes  . Smokeless tobacco: Never Used  . Tobacco comment: trying to cut back  Vaping Use  . Vaping Use: Never used  Substance Use Topics  . Alcohol use: Not Currently    Comment: rare  . Drug use: No    ALLERGIES:  has No Known Allergies.  MEDICATIONS:  Current Outpatient Medications  Medication Sig Dispense Refill  . aspirin 81 MG chewable tablet Chew 1 tablet (81 mg total) by mouth daily. 90 tablet 1  . carvedilol (COREG) 12.5 MG tablet Take 1.5 tablets (18.75 mg total) by mouth 2 (two) times daily. 270 tablet 3  . Cholecalciferol (VITAMIN D-1000 MAX ST) 25 MCG (1000 UT) tablet Take 1,000 Units by mouth daily.     . citalopram (CELEXA) 40 MG tablet Take 1 tablet (40 mg total) by mouth daily. 90 tablet 1  . epoetin alfa-epbx (RETACRIT)  10000 UNIT/ML injection Inject into the skin. Once a month at dialysis    . ferrous sulfate 325 (65 FE) MG tablet Take 1 tablet (325 mg total) by mouth daily with breakfast. 30 tablet 1  . furosemide (LASIX) 20 MG tablet Take 1 tablet (20 mg total) by mouth daily. 90 tablet 1  . gentamicin cream (GARAMYCIN) 0.1 % Apply 1 application topically every other day.    . levETIRAcetam (KEPPRA) 500 MG tablet Take 1 tablet (500 mg total) by mouth at bedtime. 30 tablet 0  . omeprazole (PRILOSEC) 20 MG capsule Take 1 capsule (20 mg total) by mouth daily. 90 capsule 1  . ondansetron (ZOFRAN ODT) 8 MG disintegrating tablet Take 1 tablet (8 mg total) by mouth every 8 (eight) hours as needed for nausea or vomiting. 30 tablet 1  . polyethylene glycol (MIRALAX / GLYCOLAX) 17 g packet Take 17 g by mouth daily as needed. 14 each 0   No current  facility-administered medications for this visit.    PHYSICAL EXAMINATION: ECOG PERFORMANCE STATUS: 1 - Symptomatic but completely ambulatory  BP (!) 149/69   Pulse 75   Temp 98.2 F (36.8 C) (Tympanic)   Resp 20   Ht _0  (1.702 m)   Wt 154 lb (69.9 kg)   BMI 24.12 kg/m   Filed Weights   01/23/21 0952  Weight: 154 lb (69.9 kg)    Physical Exam Constitutional:      Comments: Patient is in a wheelchair.  He is accompanied by his wife.  Appears cachectic.  HENT:     Head: Normocephalic and atraumatic.     Mouth/Throat:     Pharynx: No oropharyngeal exudate.  Eyes:     Pupils: Pupils are equal, round, and reactive to light.  Cardiovascular:     Rate and Rhythm: Normal rate and regular rhythm.  Pulmonary:     Effort: No respiratory distress.     Breath sounds: No wheezing.     Comments: Decreased air entry bilaterally. Abdominal:     General: Bowel sounds are normal. There is no distension.     Palpations: Abdomen is soft. There is no mass.     Tenderness: There is no abdominal tenderness. There is no guarding or rebound.  Musculoskeletal:         General: No tenderness. Normal range of motion.     Cervical back: Normal range of motion and neck supple.  Skin:    General: Skin is warm.  Neurological:     Mental Status: He is alert and oriented to person, place, and time.     Comments: Left upper extremity weakness noted.    Psychiatric:        Mood and Affect: Affect normal.     LABORATORY DATA:  I have reviewed the data as listed    Component Value Date/Time   NA 139 01/23/2021 0939   NA 138 01/08/2021 0915   K 4.5 01/23/2021 0939   CL 105 01/23/2021 0939   CO2 23 01/23/2021 0939   GLUCOSE 130 (H) 01/23/2021 0939   BUN 51 (H) 01/23/2021 0939   BUN 63 (H) 01/08/2021 0915   CREATININE 6.43 (H) 01/23/2021 0939   CREATININE 1.12 09/30/2013 0953   CALCIUM 7.9 (L) 01/23/2021 0939   PROT 5.9 (L) 01/23/2021 0939   PROT 5.5 (L) 01/08/2021 0915   ALBUMIN 3.1 (L) 01/23/2021 0939   ALBUMIN 3.7 (L) 01/08/2021 0915   AST 13 (L) 01/23/2021 0939   ALT 11 01/23/2021 0939   ALKPHOS 45 01/23/2021 0939   BILITOT 0.5 01/23/2021 0939   BILITOT 0.3 01/08/2021 0915   GFRNONAA 9 (L) 01/23/2021 0939   GFRNONAA >60 09/30/2013 0953   GFRAA 14 (L) 07/10/2020 0909   GFRAA >60 09/30/2013 0953    No results found for: SPEP, UPEP  Lab Results  Component Value Date   WBC 5.4 01/23/2021   NEUTROABS 4.9 01/23/2021   HGB 9.4 (L) 01/23/2021   HCT 28.9 (L) 01/23/2021   MCV 90.6 01/23/2021   PLT 145 (L) 01/23/2021      Chemistry      Component Value Date/Time   NA 139 01/23/2021 0939   NA 138 01/08/2021 0915   K 4.5 01/23/2021 0939   CL 105 01/23/2021 0939   CO2 23 01/23/2021 0939   BUN 51 (H) 01/23/2021 0939   BUN 63 (H) 01/08/2021 0915   CREATININE 6.43 (H) 01/23/2021 0939   CREATININE 1.12 09/30/2013 4132  Component Value Date/Time   CALCIUM 7.9 (L) 01/23/2021 0939   ALKPHOS 45 01/23/2021 0939   AST 13 (L) 01/23/2021 0939   ALT 11 01/23/2021 0939   BILITOT 0.5 01/23/2021 0939   BILITOT 0.3 01/08/2021 0915        RADIOGRAPHIC STUDIES: I have personally reviewed the radiological images as listed and agreed with the findings in the report. No results found.   ASSESSMENT & PLAN:  Primary myelofibrosis ([place]) # Primary Myelofiborisis [Bone marrow 2010] jak-2 POSITIVE; currently on surveillance. OFF Jakafi because of intolerance.   # Clinically worsening [fatigue; weight loss; dyspnea- see below]; Ultrasound February 2022-worsening splenomegaly/clinically concerning for worsening symptoms secondary myelofibrosis. Pt plans to start today JAKAFI; recommend starting 1 pill Monday Wednesday Friday after dialysis.  Discussed with pharmacy regarding peritoneal dialysis dosing.  #Anemia-today hemoglobin is 9.4.  Currently on EPO stimulating agent /IV iron as per nephrology; monitor anemia closely on Jakafi.  Discussed with Dr. Holley Raring.  #Stroke/left upper extremity weakness/seizures- [s/p shoulder surgery]- STABLE; on antiplatelet therapy.awaiting EEG on 5/5.   #COPD-poorly controlled; declines PFTs; continue prednisone 10 mg a day.  #End-stage renal disease [Dr. Latif]  on PD; STABLE   # COVID vaccine: DECLINED Covid-19 vaccine; recommend restart acyclovir.  I spoke at length with the patient'swife egarding the patient's clinical status/plan of care.  Family agreement.  The wife to call us with dosing for Jakafi.   # DISPOSITION:  # HOLD PRBC transfusion # follow up in 1 months-labs- MD-CBC/CMP;LDH;HOLD tube; posssible1 unit PRBC tranfusion--Dr.B  Cc; Dr.[name] /Dr.Johnson   Orders Placed This Encounter  Procedures  . CBC with Differential    Standing Status:   Future    Standing Expiration Date:   01/23/2022  . Comprehensive metabolic panel    Standing Status:   Future    Standing Expiration Date:   01/23/2022  . Lactate dehydrogenase    Standing Status:   Future    Standing Expiration Date:   01/23/2022  . Hold Tube- Blood Bank    Standing Status:   Future    Standing Expiration Date:    01/23/2022   All questions were answered. The patient knows to call the clinic with any problems, questions or concerns.     Cammie Sickle, MD 01/23/2021 12:51 PM

## 2021-01-23 NOTE — Assessment & Plan Note (Addendum)
#   Primary Myelofiborisis [Bone marrow 2010] jak-2 POSITIVE; currently on surveillance. OFF Jakafi because of intolerance.   # Clinically worsening [fatigue; weight loss; dyspnea- see below]; Ultrasound February 2022-worsening splenomegaly/clinically concerning for worsening symptoms secondary myelofibrosis. Pt plans to start today JAKAFI; recommend starting 1 pill Monday Wednesday Friday after dialysis.  Discussed with pharmacy regarding peritoneal dialysis dosing.  #Anemia-today hemoglobin is 9.4.  Currently on EPO stimulating agent /IV iron as per nephrology; monitor anemia closely on Jakafi.  Discussed with Dr. Holley Raring.  #Stroke/left upper extremity weakness/seizures- [s/p shoulder surgery]- STABLE; on antiplatelet therapy.awaiting EEG on 5/5.   #COPD-poorly controlled; declines PFTs; continue prednisone 10 mg a day.  #End-stage renal disease [Dr. Latif]  on PD; STABLE   # COVID vaccine: DECLINED Covid-19 vaccine; recommend restart acyclovir.  I spoke at length with the patient'swife egarding the patient's clinical status/plan of care.  Family agreement.  The wife to call us with dosing for Jakafi.   # DISPOSITION:  # HOLD PRBC transfusion # follow up in 1 months-labs- MD-CBC/CMP;LDH;HOLD tube; posssible1 unit PRBC tranfusion--Dr.B  Cc; Dr.[name] /Dr.Johnson

## 2021-01-24 ENCOUNTER — Telehealth: Payer: Self-pay | Admitting: *Deleted

## 2021-01-24 DIAGNOSIS — Z992 Dependence on renal dialysis: Secondary | ICD-10-CM | POA: Diagnosis not present

## 2021-01-24 DIAGNOSIS — N186 End stage renal disease: Secondary | ICD-10-CM | POA: Diagnosis not present

## 2021-01-24 DIAGNOSIS — R569 Unspecified convulsions: Secondary | ICD-10-CM | POA: Diagnosis not present

## 2021-01-24 NOTE — Telephone Encounter (Signed)
Wife is aware of response below and expressed understanding. No more questions in regards.

## 2021-01-24 NOTE — Telephone Encounter (Signed)
James Arnold- please inform the wife that the patient should start taking Jakafi 10 mg Monday Wednesday Friday after his dialysis. Follow up as planned. GB

## 2021-01-24 NOTE — Telephone Encounter (Signed)
Wife called and reported that patient James Arnold dose is 10 mg and that Dr B wanted to know this

## 2021-01-25 ENCOUNTER — Other Ambulatory Visit: Payer: Medicare HMO

## 2021-01-25 ENCOUNTER — Ambulatory Visit: Payer: Medicare HMO

## 2021-01-25 ENCOUNTER — Other Ambulatory Visit (HOSPITAL_COMMUNITY): Payer: Self-pay

## 2021-01-25 ENCOUNTER — Ambulatory Visit: Payer: Medicare HMO | Admitting: Internal Medicine

## 2021-01-25 DIAGNOSIS — Z992 Dependence on renal dialysis: Secondary | ICD-10-CM | POA: Diagnosis not present

## 2021-01-25 DIAGNOSIS — N186 End stage renal disease: Secondary | ICD-10-CM | POA: Diagnosis not present

## 2021-01-27 ENCOUNTER — Other Ambulatory Visit: Payer: Self-pay | Admitting: Family Medicine

## 2021-01-27 DIAGNOSIS — Z992 Dependence on renal dialysis: Secondary | ICD-10-CM | POA: Diagnosis not present

## 2021-01-27 DIAGNOSIS — N186 End stage renal disease: Secondary | ICD-10-CM | POA: Diagnosis not present

## 2021-01-28 DIAGNOSIS — I2510 Atherosclerotic heart disease of native coronary artery without angina pectoris: Secondary | ICD-10-CM | POA: Diagnosis not present

## 2021-01-28 DIAGNOSIS — N185 Chronic kidney disease, stage 5: Secondary | ICD-10-CM | POA: Diagnosis not present

## 2021-01-28 DIAGNOSIS — M109 Gout, unspecified: Secondary | ICD-10-CM | POA: Diagnosis not present

## 2021-01-28 DIAGNOSIS — R1310 Dysphagia, unspecified: Secondary | ICD-10-CM | POA: Diagnosis not present

## 2021-01-28 DIAGNOSIS — I1311 Hypertensive heart and chronic kidney disease without heart failure, with stage 5 chronic kidney disease, or end stage renal disease: Secondary | ICD-10-CM | POA: Diagnosis not present

## 2021-01-28 DIAGNOSIS — Z992 Dependence on renal dialysis: Secondary | ICD-10-CM | POA: Diagnosis not present

## 2021-01-28 DIAGNOSIS — N186 End stage renal disease: Secondary | ICD-10-CM | POA: Diagnosis not present

## 2021-01-28 DIAGNOSIS — D631 Anemia in chronic kidney disease: Secondary | ICD-10-CM | POA: Diagnosis not present

## 2021-01-28 DIAGNOSIS — Z471 Aftercare following joint replacement surgery: Secondary | ICD-10-CM | POA: Diagnosis not present

## 2021-01-28 DIAGNOSIS — E785 Hyperlipidemia, unspecified: Secondary | ICD-10-CM | POA: Diagnosis not present

## 2021-01-28 DIAGNOSIS — J449 Chronic obstructive pulmonary disease, unspecified: Secondary | ICD-10-CM | POA: Diagnosis not present

## 2021-01-29 DIAGNOSIS — N186 End stage renal disease: Secondary | ICD-10-CM | POA: Diagnosis not present

## 2021-01-29 DIAGNOSIS — Z992 Dependence on renal dialysis: Secondary | ICD-10-CM | POA: Diagnosis not present

## 2021-01-30 DIAGNOSIS — Z992 Dependence on renal dialysis: Secondary | ICD-10-CM | POA: Diagnosis not present

## 2021-01-30 DIAGNOSIS — N186 End stage renal disease: Secondary | ICD-10-CM | POA: Diagnosis not present

## 2021-01-31 DIAGNOSIS — J449 Chronic obstructive pulmonary disease, unspecified: Secondary | ICD-10-CM | POA: Diagnosis not present

## 2021-01-31 DIAGNOSIS — Z471 Aftercare following joint replacement surgery: Secondary | ICD-10-CM | POA: Diagnosis not present

## 2021-01-31 DIAGNOSIS — R1310 Dysphagia, unspecified: Secondary | ICD-10-CM | POA: Diagnosis not present

## 2021-01-31 DIAGNOSIS — Z992 Dependence on renal dialysis: Secondary | ICD-10-CM | POA: Diagnosis not present

## 2021-01-31 DIAGNOSIS — I2510 Atherosclerotic heart disease of native coronary artery without angina pectoris: Secondary | ICD-10-CM | POA: Diagnosis not present

## 2021-01-31 DIAGNOSIS — N185 Chronic kidney disease, stage 5: Secondary | ICD-10-CM | POA: Diagnosis not present

## 2021-01-31 DIAGNOSIS — M109 Gout, unspecified: Secondary | ICD-10-CM | POA: Diagnosis not present

## 2021-01-31 DIAGNOSIS — E785 Hyperlipidemia, unspecified: Secondary | ICD-10-CM | POA: Diagnosis not present

## 2021-01-31 DIAGNOSIS — I1311 Hypertensive heart and chronic kidney disease without heart failure, with stage 5 chronic kidney disease, or end stage renal disease: Secondary | ICD-10-CM | POA: Diagnosis not present

## 2021-01-31 DIAGNOSIS — D631 Anemia in chronic kidney disease: Secondary | ICD-10-CM | POA: Diagnosis not present

## 2021-01-31 DIAGNOSIS — N186 End stage renal disease: Secondary | ICD-10-CM | POA: Diagnosis not present

## 2021-02-01 DIAGNOSIS — N186 End stage renal disease: Secondary | ICD-10-CM | POA: Diagnosis not present

## 2021-02-01 DIAGNOSIS — Z992 Dependence on renal dialysis: Secondary | ICD-10-CM | POA: Diagnosis not present

## 2021-02-03 DIAGNOSIS — N186 End stage renal disease: Secondary | ICD-10-CM | POA: Diagnosis not present

## 2021-02-03 DIAGNOSIS — Z992 Dependence on renal dialysis: Secondary | ICD-10-CM | POA: Diagnosis not present

## 2021-02-04 DIAGNOSIS — N186 End stage renal disease: Secondary | ICD-10-CM | POA: Diagnosis not present

## 2021-02-04 DIAGNOSIS — Z992 Dependence on renal dialysis: Secondary | ICD-10-CM | POA: Diagnosis not present

## 2021-02-05 DIAGNOSIS — N186 End stage renal disease: Secondary | ICD-10-CM | POA: Diagnosis not present

## 2021-02-05 DIAGNOSIS — Z992 Dependence on renal dialysis: Secondary | ICD-10-CM | POA: Diagnosis not present

## 2021-02-06 DIAGNOSIS — N186 End stage renal disease: Secondary | ICD-10-CM | POA: Diagnosis not present

## 2021-02-06 DIAGNOSIS — Z992 Dependence on renal dialysis: Secondary | ICD-10-CM | POA: Diagnosis not present

## 2021-02-07 DIAGNOSIS — Z992 Dependence on renal dialysis: Secondary | ICD-10-CM | POA: Diagnosis not present

## 2021-02-07 DIAGNOSIS — N186 End stage renal disease: Secondary | ICD-10-CM | POA: Diagnosis not present

## 2021-02-08 DIAGNOSIS — N186 End stage renal disease: Secondary | ICD-10-CM | POA: Diagnosis not present

## 2021-02-08 DIAGNOSIS — Z992 Dependence on renal dialysis: Secondary | ICD-10-CM | POA: Diagnosis not present

## 2021-02-10 DIAGNOSIS — Z992 Dependence on renal dialysis: Secondary | ICD-10-CM | POA: Diagnosis not present

## 2021-02-10 DIAGNOSIS — N186 End stage renal disease: Secondary | ICD-10-CM | POA: Diagnosis not present

## 2021-02-11 DIAGNOSIS — N186 End stage renal disease: Secondary | ICD-10-CM | POA: Diagnosis not present

## 2021-02-11 DIAGNOSIS — Z992 Dependence on renal dialysis: Secondary | ICD-10-CM | POA: Diagnosis not present

## 2021-02-12 DIAGNOSIS — Z992 Dependence on renal dialysis: Secondary | ICD-10-CM | POA: Diagnosis not present

## 2021-02-12 DIAGNOSIS — N186 End stage renal disease: Secondary | ICD-10-CM | POA: Diagnosis not present

## 2021-02-13 DIAGNOSIS — N186 End stage renal disease: Secondary | ICD-10-CM | POA: Diagnosis not present

## 2021-02-13 DIAGNOSIS — Z992 Dependence on renal dialysis: Secondary | ICD-10-CM | POA: Diagnosis not present

## 2021-02-14 DIAGNOSIS — N186 End stage renal disease: Secondary | ICD-10-CM | POA: Diagnosis not present

## 2021-02-14 DIAGNOSIS — Z992 Dependence on renal dialysis: Secondary | ICD-10-CM | POA: Diagnosis not present

## 2021-02-15 ENCOUNTER — Other Ambulatory Visit: Payer: Self-pay | Admitting: Internal Medicine

## 2021-02-15 ENCOUNTER — Other Ambulatory Visit (HOSPITAL_COMMUNITY): Payer: Self-pay

## 2021-02-15 DIAGNOSIS — N186 End stage renal disease: Secondary | ICD-10-CM | POA: Diagnosis not present

## 2021-02-15 DIAGNOSIS — D471 Chronic myeloproliferative disease: Secondary | ICD-10-CM

## 2021-02-15 DIAGNOSIS — Z992 Dependence on renal dialysis: Secondary | ICD-10-CM | POA: Diagnosis not present

## 2021-02-17 DIAGNOSIS — Z992 Dependence on renal dialysis: Secondary | ICD-10-CM | POA: Diagnosis not present

## 2021-02-17 DIAGNOSIS — N186 End stage renal disease: Secondary | ICD-10-CM | POA: Diagnosis not present

## 2021-02-18 DIAGNOSIS — N186 End stage renal disease: Secondary | ICD-10-CM | POA: Diagnosis not present

## 2021-02-18 DIAGNOSIS — Z992 Dependence on renal dialysis: Secondary | ICD-10-CM | POA: Diagnosis not present

## 2021-02-19 ENCOUNTER — Other Ambulatory Visit (HOSPITAL_COMMUNITY): Payer: Self-pay

## 2021-02-19 DIAGNOSIS — Z992 Dependence on renal dialysis: Secondary | ICD-10-CM | POA: Diagnosis not present

## 2021-02-19 DIAGNOSIS — N186 End stage renal disease: Secondary | ICD-10-CM | POA: Diagnosis not present

## 2021-02-20 ENCOUNTER — Other Ambulatory Visit: Payer: Self-pay

## 2021-02-20 ENCOUNTER — Inpatient Hospital Stay: Payer: Medicare HMO

## 2021-02-20 ENCOUNTER — Inpatient Hospital Stay (HOSPITAL_BASED_OUTPATIENT_CLINIC_OR_DEPARTMENT_OTHER): Payer: Medicare HMO | Admitting: Internal Medicine

## 2021-02-20 ENCOUNTER — Inpatient Hospital Stay: Payer: Medicare HMO | Attending: Internal Medicine

## 2021-02-20 ENCOUNTER — Other Ambulatory Visit (HOSPITAL_COMMUNITY): Payer: Self-pay

## 2021-02-20 ENCOUNTER — Encounter: Payer: Self-pay | Admitting: Internal Medicine

## 2021-02-20 ENCOUNTER — Other Ambulatory Visit: Payer: Self-pay | Admitting: Pharmacist

## 2021-02-20 ENCOUNTER — Telehealth: Payer: Self-pay | Admitting: Family Medicine

## 2021-02-20 DIAGNOSIS — Z8616 Personal history of COVID-19: Secondary | ICD-10-CM | POA: Insufficient documentation

## 2021-02-20 DIAGNOSIS — D471 Chronic myeloproliferative disease: Secondary | ICD-10-CM

## 2021-02-20 DIAGNOSIS — E1122 Type 2 diabetes mellitus with diabetic chronic kidney disease: Secondary | ICD-10-CM | POA: Diagnosis not present

## 2021-02-20 DIAGNOSIS — R0602 Shortness of breath: Secondary | ICD-10-CM | POA: Diagnosis not present

## 2021-02-20 DIAGNOSIS — N186 End stage renal disease: Secondary | ICD-10-CM | POA: Diagnosis not present

## 2021-02-20 DIAGNOSIS — Z992 Dependence on renal dialysis: Secondary | ICD-10-CM | POA: Diagnosis not present

## 2021-02-20 DIAGNOSIS — D649 Anemia, unspecified: Secondary | ICD-10-CM | POA: Insufficient documentation

## 2021-02-20 DIAGNOSIS — Z87891 Personal history of nicotine dependence: Secondary | ICD-10-CM | POA: Diagnosis not present

## 2021-02-20 DIAGNOSIS — Z8673 Personal history of transient ischemic attack (TIA), and cerebral infarction without residual deficits: Secondary | ICD-10-CM | POA: Insufficient documentation

## 2021-02-20 DIAGNOSIS — J449 Chronic obstructive pulmonary disease, unspecified: Secondary | ICD-10-CM | POA: Diagnosis not present

## 2021-02-20 DIAGNOSIS — Z515 Encounter for palliative care: Secondary | ICD-10-CM | POA: Insufficient documentation

## 2021-02-20 DIAGNOSIS — K668 Other specified disorders of peritoneum: Secondary | ICD-10-CM

## 2021-02-20 HISTORY — DX: Other specified disorders of peritoneum: K66.8

## 2021-02-20 LAB — COMPREHENSIVE METABOLIC PANEL
ALT: 26 U/L (ref 0–44)
AST: 20 U/L (ref 15–41)
Albumin: 3.3 g/dL — ABNORMAL LOW (ref 3.5–5.0)
Alkaline Phosphatase: 41 U/L (ref 38–126)
Anion gap: 13 (ref 5–15)
BUN: 63 mg/dL — ABNORMAL HIGH (ref 8–23)
CO2: 21 mmol/L — ABNORMAL LOW (ref 22–32)
Calcium: 7.1 mg/dL — ABNORMAL LOW (ref 8.9–10.3)
Chloride: 104 mmol/L (ref 98–111)
Creatinine, Ser: 6.78 mg/dL — ABNORMAL HIGH (ref 0.61–1.24)
GFR, Estimated: 8 mL/min — ABNORMAL LOW (ref >60)
Glucose, Bld: 90 mg/dL (ref 70–99)
Potassium: 4.7 mmol/L (ref 3.5–5.1)
Sodium: 138 mmol/L (ref 135–145)
Total Bilirubin: 0.7 mg/dL (ref 0.3–1.2)
Total Protein: 5.7 g/dL — ABNORMAL LOW (ref 6.5–8.1)

## 2021-02-20 LAB — CBC WITH DIFFERENTIAL/PLATELET
Abs Immature Granulocytes: 0.04 10*3/uL (ref 0.00–0.07)
Basophils Absolute: 0 10*3/uL (ref 0.0–0.1)
Basophils Relative: 0 %
Eosinophils Absolute: 0 10*3/uL (ref 0.0–0.5)
Eosinophils Relative: 0 %
HCT: 28.5 % — ABNORMAL LOW (ref 39.0–52.0)
Hemoglobin: 9.5 g/dL — ABNORMAL LOW (ref 13.0–17.0)
Immature Granulocytes: 1 %
Lymphocytes Relative: 11 %
Lymphs Abs: 0.6 10*3/uL — ABNORMAL LOW (ref 0.7–4.0)
MCH: 29.6 pg (ref 26.0–34.0)
MCHC: 33.3 g/dL (ref 30.0–36.0)
MCV: 88.8 fL (ref 80.0–100.0)
Monocytes Absolute: 0.2 10*3/uL (ref 0.1–1.0)
Monocytes Relative: 3 %
Neutro Abs: 4.4 10*3/uL (ref 1.7–7.7)
Neutrophils Relative %: 85 %
Platelets: 129 10*3/uL — ABNORMAL LOW (ref 150–400)
RBC: 3.21 MIL/uL — ABNORMAL LOW (ref 4.22–5.81)
RDW: 15.5 % (ref 11.5–15.5)
WBC: 5.2 10*3/uL (ref 4.0–10.5)
nRBC: 0 % (ref 0.0–0.2)

## 2021-02-20 LAB — LACTATE DEHYDROGENASE: LDH: 561 U/L — ABNORMAL HIGH (ref 98–192)

## 2021-02-20 LAB — SAMPLE TO BLOOD BANK

## 2021-02-20 MED ORDER — RUXOLITINIB PHOSPHATE 15 MG PO TABS
ORAL_TABLET | ORAL | 4 refills | Status: DC
  Filled 2021-02-20: qty 30, 30d supply, fill #0

## 2021-02-20 MED ORDER — RUXOLITINIB PHOSPHATE 15 MG PO TABS
ORAL_TABLET | ORAL | 4 refills | Status: DC
  Filled 2021-02-20: qty 12, 28d supply, fill #0

## 2021-02-20 NOTE — Telephone Encounter (Signed)
I don't interpret EEGs. If this was ordered by neurology- they should give him the results.

## 2021-02-20 NOTE — Progress Notes (Signed)
Patient here for oncology follow-up appointment, expresses concerns of left arm numbness

## 2021-02-20 NOTE — Telephone Encounter (Signed)
Is this correct? 

## 2021-02-20 NOTE — Progress Notes (Signed)
[facility] OFFICE PROGRESS NOTE  Patient Care Team: Valerie Roys, DO as PCP - General (Family Medicine) End, Harrell Gave, MD as PCP - Cardiology (Cardiology) Cammie Sickle, MD as Consulting Physician (Internal Medicine)  Cancer Staging No matching staging information was found for the patient.   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   # AUGUST 1st week 2019- Retacrit   # October 2nd 2019- jakafi 10 BID [? Renal involvement]; September 2020-peritoneal dialysis; Jakafi 10 mg in the morning; STOPPED/tapered [name], 2021 [sec intolerance fatigue/myalgias]  # May 4th 2022- START [name] dosing/PD]  # CKD 1.5; poorly controlled HTN; AUG 2018- worsening of renal function Creat ~2.1; AUG 2018- Bil Kidney US- NEG for hydronephrosis. [name]- kidney Bx- FSG [ on Prednisone Dr.[name]]; July 2020-peritoneal dialysis;   # hx of Lung nodules- resolved [[name]]/quit smoking.  DIAGNOSIS: PRIMARY MYELOFIBROSIS  RISK:LOW     ;GOALS: Control  CURRENT/MOST RECENT THERAPY : Surveillance    Primary myelofibrosis ([name])      INTERVAL HISTORY:  James Arnold 65 y.o.  male pleasant patient above history of primary myelofibrosis Jak 2+ currently on Jakafi [restarted since Jan 23, 2021] chronic kidney disease on peritoneal dialysis is here for follow-up.  Patient complains of tingling and numbness of his left lower extremity since his stroke.  He is currently awaiting results of his EEG.  He continues to get erythropoietin injections/IV Venofer with nephrology.  He states he been compliant with his Jakafi.  Review of Systems  Constitutional: Positive for malaise/fatigue and weight loss. Negative for chills, diaphoresis and fever.  HENT: Negative for  nosebleeds and sore throat.   Eyes: Negative for double vision.  Respiratory: Positive for cough and shortness of breath. Negative for hemoptysis, sputum production and wheezing.   Cardiovascular: Negative for chest pain, palpitations and orthopnea.  Gastrointestinal: Negative for abdominal pain, blood in stool, constipation, diarrhea, heartburn, melena, nausea and vomiting.  Genitourinary: Negative for dysuria, frequency and urgency.  Musculoskeletal: Positive for back pain and joint pain.  Skin: Negative.  Negative for itching and rash.  Neurological: Positive for tingling. Negative for dizziness, focal weakness, weakness and headaches.  Endo/Heme/Allergies: Does not bruise/bleed easily.  Psychiatric/Behavioral: Negative for depression. The patient is not nervous/anxious and does not have insomnia.       PAST MEDICAL HISTORY :  Past Medical History:  Diagnosis Date  . Anemia   . Benign hypertensive renal disease   . CAD (coronary artery disease)    a. 08/1996 s/p BMS to the mLAD ([facility]); b. 12/2017 MV: EF 46%, fixed apical defect w/ significant GI uptake artifact. No ischemia-> low risk.  . CKD (chronic kidney disease), stage V ([place])    on peritoneal dialysis  . Complication of anesthesia    please call by "[name]" when waking up!!  . COPD (chronic obstructive pulmonary disease) ([place])    centrilobular emphysema. pt is poorly controlled with his copd/smoking.  . Depression   . Diastolic dysfunction    a. 12/2017 Echo: EF 60-65%, no rwma, Gr1 DD, mild AI/MR. Nl RVSP.  [name] Kitchen Dyspnea    with exertion  . Fatigue   . FSGS (focal segmental glomerulosclerosis)   . Hyperlipidemia 10/2002  . Hypertension   . myelofibrosis    bone marrow failure  . Myelofibrosis ([place]) 2010   JAK2 (+)  primary  . Myocardial infarction ([place]) 1997   stent x 1  . Smoker   . Thrombocytopenia ([place])     PAST SURGICAL HISTORY :   Past Surgical History:  Procedure Laterality Date  . ANGIOPLASTY    . BONE  MARROW ASPIRATION  03/2009  . CARDIAC CATHETERIZATION  1997   stent x 1  . COLONOSCOPY WITH PROPOFOL N/A 08/09/2018   Procedure: COLONOSCOPY WITH PROPOFOL;  Surgeon: [name], MD;  Location: [facility] ENDOSCOPY;  Service: Gastroenterology;  Laterality: N/A;  . CORONARY STENT PLACEMENT  08/1996  . EXTERIORIZATION OF A CONTINUOUS AMBULATORY PERITONEAL DIALYSIS CATHETER    . EYE SURGERY Bilateral    cats removed  . REVERSE SHOULDER ARTHROPLASTY Left 11/19/2020   Procedure: Left reverse shoulder arthroplasty;  Surgeon: Leim Fabry, MD;  Location: [facility] ORS;  Service: Orthopedics;  Laterality: Left;    FAMILY HISTORY :   Family History  Problem Relation Age of Onset  . Stroke Mother        Low BP stroke  . Depression Father   . Depression Sister        Breast  . Heart disease Other   . Breast cancer Other   . Lung cancer Other   . Ovarian cancer Other   . Stomach cancer Other     SOCIAL HISTORY:   Social History   Tobacco Use  . Smoking status: Former Smoker    Packs/day: 0.75    Years: 47.00    Pack years: 35.25    Types: Cigarettes  . Smokeless tobacco: Never Used  . Tobacco comment: trying to cut back  Vaping Use  . Vaping Use: Never used  Substance Use Topics  . Alcohol use: Not Currently    Comment: rare  . Drug use: No    ALLERGIES:  has No Known Allergies.  MEDICATIONS:  Current Outpatient Medications  Medication Sig Dispense Refill  . aspirin 81 MG chewable tablet Chew 1 tablet (81 mg total) by mouth daily. 90 tablet 1  . carvedilol (COREG) 12.5 MG tablet Take 1.5 tablets (18.75 mg total) by mouth 2 (two) times daily. 270 tablet 3  . Cholecalciferol (VITAMIN D-1000 MAX ST) 25 MCG (1000 UT) tablet Take 1,000 Units by mouth daily.     . citalopram (CELEXA) 40 MG tablet Take 1 tablet (40 mg total) by mouth daily. 90 tablet 1  . epoetin alfa-epbx (RETACRIT) 16109 UNIT/ML injection Inject into the skin. Once a month at dialysis    . ferrous sulfate 325 (65 FE) MG  tablet Take 1 tablet (325 mg total) by mouth daily with breakfast. 30 tablet 1  . furosemide (LASIX) 20 MG tablet Take 1 tablet (20 mg total) by mouth daily. 90 tablet 1  . gentamicin cream (GARAMYCIN) 0.1 % Apply 1 application topically every other day.    . levETIRAcetam (KEPPRA) 500 MG tablet Take 1 tablet (500 mg total) by mouth at bedtime. 30 tablet 0  . omeprazole (PRILOSEC) 20 MG capsule Take 1 capsule (20 mg total) by mouth daily. 90 capsule 1  . ondansetron (ZOFRAN ODT) 8 MG disintegrating tablet Take 1 tablet (8 mg total) by mouth every 8 (eight) hours as needed for nausea or vomiting. 30 tablet 1  . polyethylene glycol (MIRALAX / GLYCOLAX) 17 g packet Take 17 g by mouth daily as needed. 14 each 0  . ruxolitinib phosphate (JAKAFI) 15 MG tablet Take 1 pill Monday Wednesday Friday after dialysis. 12 tablet 4   No current facility-administered  medications for this visit.    PHYSICAL EXAMINATION: ECOG PERFORMANCE STATUS: 1 - Symptomatic but completely ambulatory  BP (!) 148/82 (BP Location: Left Arm, Patient Position: Sitting)   Pulse 72   Temp (!) 96 F (35.6 C) (Tympanic)   Resp 18   Wt 70.3 kg   SpO2 100%   BMI 24.28 kg/m   Filed Weights   02/20/21 0932  Weight: 70.3 kg    Physical Exam Constitutional:      Comments: Ambulating independently.  He is alone. Appears cachectic.  HENT:     Head: Normocephalic and atraumatic.     Mouth/Throat:     Pharynx: No oropharyngeal exudate.  Eyes:     Pupils: Pupils are equal, round, and reactive to light.  Cardiovascular:     Rate and Rhythm: Normal rate and regular rhythm.  Pulmonary:     Effort: No respiratory distress.     Breath sounds: No wheezing.     Comments: Decreased air entry bilaterally. Abdominal:     General: Bowel sounds are normal. There is no distension.     Palpations: Abdomen is soft. There is no mass.     Tenderness: There is no abdominal tenderness. There is no guarding or rebound.  Musculoskeletal:         General: No tenderness. Normal range of motion.     Cervical back: Normal range of motion and neck supple.  Skin:    General: Skin is warm.  Neurological:     Mental Status: He is alert and oriented to person, place, and time.     Comments: Left upper extremity weakness noted.    Psychiatric:        Mood and Affect: Affect normal.     LABORATORY DATA:  I have reviewed the data as listed    Component Value Date/Time   NA 138 02/20/2021 0848   NA 138 01/08/2021 0915   K 4.7 02/20/2021 0848   CL 104 02/20/2021 0848   CO2 21 (L) 02/20/2021 0848   GLUCOSE 90 02/20/2021 0848   BUN 63 (H) 02/20/2021 0848   BUN 63 (H) 01/08/2021 0915   CREATININE 6.78 (H) 02/20/2021 0848   CREATININE 1.12 09/30/2013 0953   CALCIUM 7.1 (L) 02/20/2021 0848   PROT 5.7 (L) 02/20/2021 0848   PROT 5.5 (L) 01/08/2021 0915   ALBUMIN 3.3 (L) 02/20/2021 0848   ALBUMIN 3.7 (L) 01/08/2021 0915   AST 20 02/20/2021 0848   ALT 26 02/20/2021 0848   ALKPHOS 41 02/20/2021 0848   BILITOT 0.7 02/20/2021 0848   BILITOT 0.3 01/08/2021 0915   GFRNONAA 8 (L) 02/20/2021 0848   GFRNONAA >60 09/30/2013 0953   GFRAA 14 (L) 07/10/2020 0909   GFRAA >60 09/30/2013 0953    No results found for: SPEP, UPEP  Lab Results  Component Value Date   WBC 5.2 02/20/2021   NEUTROABS 4.4 02/20/2021   HGB 9.5 (L) 02/20/2021   HCT 28.5 (L) 02/20/2021   MCV 88.8 02/20/2021   PLT 129 (L) 02/20/2021      Chemistry      Component Value Date/Time   NA 138 02/20/2021 0848   NA 138 01/08/2021 0915   K 4.7 02/20/2021 0848   CL 104 02/20/2021 0848   CO2 21 (L) 02/20/2021 0848   BUN 63 (H) 02/20/2021 0848   BUN 63 (H) 01/08/2021 0915   CREATININE 6.78 (H) 02/20/2021 0848   CREATININE 1.12 09/30/2013 0953      Component Value Date/Time  CALCIUM 7.1 (L) 02/20/2021 0848   ALKPHOS 41 02/20/2021 0848   AST 20 02/20/2021 0848   ALT 26 02/20/2021 0848   BILITOT 0.7 02/20/2021 0848   BILITOT 0.3 01/08/2021 0915        RADIOGRAPHIC STUDIES: I have personally reviewed the radiological images as listed and agreed with the findings in the report. No results found.   ASSESSMENT & PLAN:  Primary myelofibrosis ([place]) # Primary Myelofiborisis [Bone marrow 2010] jak-2 POSITIVE;  February 2022-worsening splenomegaly/worsening fatigue. Currently on on Jakafi-15 mg Monday Wednesday Friday after peritoneal dialysis [next morning].  Stable  #Continue Jakafi at the current dose.;  Hemoglobin stable around 9; platelets 140s.  #Anemia-today hemoglobin is 9.5.  Currently on EPO stimulating agent /IV iron as per nephrology; monitor anemia closely on Jakafi.  Discussed with Dr. Holley Raring.  #Stroke/left upper extremity weakness/seizures- [s/p shoulder surgery]-stable.  On antiplatelet therapy.s/p EEG on 5/5.  Defer to neurology regarding results of EEG.  #COPD-poorly controlled; declines PFTs; continue prednisone 10 mg a day.  #End-stage renal disease [Dr. Latif]  on PD; STABLE   # COVID vaccine: DECLINED Covid-19 vaccine; recommend restart acyclovir.  # DISPOSITION:  # HOLD PRBC transfusion # follow up in 1 months-labs- MD-CBC/CMP;LDH;HOLD tube; posssible1 unit PRBC tranfusion--Dr.B  Cc; Dr.[name] /Dr.Johnson   No orders of the defined types were placed in this encounter.  All questions were answered. The patient knows to call the clinic with any problems, questions or concerns.     Cammie Sickle, MD 02/21/2021 8:32 AM

## 2021-02-20 NOTE — Telephone Encounter (Signed)
Patients wife notified

## 2021-02-20 NOTE — Assessment & Plan Note (Addendum)
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 Currently on on Jakafi-15 mg Monday Wednesday Friday after peritoneal dialysis [next morning].  Stable  #Continue Jakafi at the current dose.;  Hemoglobin stable around 9; platelets 140s.  #Anemia-today hemoglobin is 9.5.  Currently on EPO stimulating agent /IV iron as per nephrology; monitor anemia closely on Jakafi.  Discussed with Dr. Holley Raring.  #Stroke/left upper extremity weakness/seizures- [s/p shoulder surgery]-stable.  On antiplatelet therapy.s/p EEG on 5/5.  Defer to neurology regarding results of EEG.  #COPD-poorly controlled; declines PFTs; continue prednisone 10 mg a day.  #End-stage renal disease [Dr. Latif]  on PD; STABLE   # COVID vaccine: DECLINED Covid-19 vaccine; recommend restart acyclovir.  # DISPOSITION:  # HOLD PRBC transfusion # follow up in 1 months-labs- MD-CBC/CMP;LDH;HOLD tube; posssible1 unit PRBC tranfusion--Dr.B  Cc; Dr.[name] /Dr.Johnson

## 2021-02-20 NOTE — Telephone Encounter (Signed)
Dr. Manuella Ghazi, Neurologist from [facility] ordered a EEG and they advised the patient the EEG results were sent to the PCP and to call to inquire about results. Caller would like a follow up call as soon as possible.

## 2021-02-21 DIAGNOSIS — N186 End stage renal disease: Secondary | ICD-10-CM | POA: Diagnosis not present

## 2021-02-21 DIAGNOSIS — Z992 Dependence on renal dialysis: Secondary | ICD-10-CM | POA: Diagnosis not present

## 2021-02-22 DIAGNOSIS — Z992 Dependence on renal dialysis: Secondary | ICD-10-CM | POA: Diagnosis not present

## 2021-02-22 DIAGNOSIS — N186 End stage renal disease: Secondary | ICD-10-CM | POA: Diagnosis not present

## 2021-02-24 DIAGNOSIS — N186 End stage renal disease: Secondary | ICD-10-CM | POA: Diagnosis not present

## 2021-02-24 DIAGNOSIS — Z992 Dependence on renal dialysis: Secondary | ICD-10-CM | POA: Diagnosis not present

## 2021-02-25 DIAGNOSIS — Z992 Dependence on renal dialysis: Secondary | ICD-10-CM | POA: Diagnosis not present

## 2021-02-25 DIAGNOSIS — N186 End stage renal disease: Secondary | ICD-10-CM | POA: Diagnosis not present

## 2021-02-26 DIAGNOSIS — Z992 Dependence on renal dialysis: Secondary | ICD-10-CM | POA: Diagnosis not present

## 2021-02-26 DIAGNOSIS — N186 End stage renal disease: Secondary | ICD-10-CM | POA: Diagnosis not present

## 2021-02-27 ENCOUNTER — Inpatient Hospital Stay (HOSPITAL_BASED_OUTPATIENT_CLINIC_OR_DEPARTMENT_OTHER): Payer: Medicare HMO | Admitting: Hospice and Palliative Medicine

## 2021-02-27 ENCOUNTER — Inpatient Hospital Stay: Payer: Medicare HMO

## 2021-02-27 ENCOUNTER — Encounter: Payer: Self-pay | Admitting: Hospice and Palliative Medicine

## 2021-02-27 ENCOUNTER — Other Ambulatory Visit: Payer: Self-pay

## 2021-02-27 ENCOUNTER — Ambulatory Visit
Admission: RE | Admit: 2021-02-27 | Discharge: 2021-02-27 | Disposition: A | Discharge status: Home / Self Care | Payer: Medicare HMO | Attending: Hospice and Palliative Medicine | Admitting: Hospice and Palliative Medicine

## 2021-02-27 ENCOUNTER — Telehealth: Payer: Self-pay

## 2021-02-27 ENCOUNTER — Telehealth: Payer: Self-pay | Admitting: Internal Medicine

## 2021-02-27 ENCOUNTER — Telehealth: Payer: Self-pay | Admitting: Hospice and Palliative Medicine

## 2021-02-27 ENCOUNTER — Ambulatory Visit
Admission: RE | Admit: 2021-02-27 | Discharge: 2021-02-27 | Disposition: A | Discharge status: Home / Self Care | Payer: Medicare HMO | Source: Ambulatory Visit | Attending: Hospice and Palliative Medicine | Admitting: Hospice and Palliative Medicine

## 2021-02-27 ENCOUNTER — Telehealth: Payer: Self-pay | Admitting: *Deleted

## 2021-02-27 VITALS — BP 130/85 | HR 76 | Temp 99.7°F | Resp 18 | Wt 151.0 lb

## 2021-02-27 DIAGNOSIS — E1122 Type 2 diabetes mellitus with diabetic chronic kidney disease: Secondary | ICD-10-CM | POA: Diagnosis not present

## 2021-02-27 DIAGNOSIS — R0609 Other forms of dyspnea: Secondary | ICD-10-CM

## 2021-02-27 DIAGNOSIS — R0602 Shortness of breath: Secondary | ICD-10-CM | POA: Diagnosis not present

## 2021-02-27 DIAGNOSIS — D649 Anemia, unspecified: Secondary | ICD-10-CM | POA: Diagnosis not present

## 2021-02-27 DIAGNOSIS — D471 Chronic myeloproliferative disease: Secondary | ICD-10-CM

## 2021-02-27 DIAGNOSIS — J449 Chronic obstructive pulmonary disease, unspecified: Secondary | ICD-10-CM | POA: Diagnosis not present

## 2021-02-27 DIAGNOSIS — R0600 Dyspnea, unspecified: Secondary | ICD-10-CM | POA: Diagnosis not present

## 2021-02-27 DIAGNOSIS — Z992 Dependence on renal dialysis: Secondary | ICD-10-CM | POA: Diagnosis not present

## 2021-02-27 DIAGNOSIS — Z03818 Encounter for observation for suspected exposure to other biological agents ruled out: Secondary | ICD-10-CM | POA: Diagnosis not present

## 2021-02-27 DIAGNOSIS — R079 Chest pain, unspecified: Secondary | ICD-10-CM | POA: Diagnosis not present

## 2021-02-27 DIAGNOSIS — N186 End stage renal disease: Secondary | ICD-10-CM | POA: Diagnosis not present

## 2021-02-27 DIAGNOSIS — Z20822 Contact with and (suspected) exposure to covid-19: Secondary | ICD-10-CM | POA: Diagnosis not present

## 2021-02-27 DIAGNOSIS — Z8616 Personal history of COVID-19: Secondary | ICD-10-CM | POA: Diagnosis not present

## 2021-02-27 DIAGNOSIS — Z515 Encounter for palliative care: Secondary | ICD-10-CM | POA: Diagnosis not present

## 2021-02-27 LAB — COMPREHENSIVE METABOLIC PANEL
ALT: 11 U/L (ref 0–44)
AST: 9 U/L — ABNORMAL LOW (ref 15–41)
Albumin: 3.1 g/dL — ABNORMAL LOW (ref 3.5–5.0)
Alkaline Phosphatase: 44 U/L (ref 38–126)
Anion gap: 14 (ref 5–15)
BUN: 65 mg/dL — ABNORMAL HIGH (ref 8–23)
CO2: 19 mmol/L — ABNORMAL LOW (ref 22–32)
Calcium: 7.3 mg/dL — ABNORMAL LOW (ref 8.9–10.3)
Chloride: 102 mmol/L (ref 98–111)
Creatinine, Ser: 7.17 mg/dL — ABNORMAL HIGH (ref 0.61–1.24)
GFR, Estimated: 8 mL/min — ABNORMAL LOW (ref >60)
Glucose, Bld: 116 mg/dL — ABNORMAL HIGH (ref 70–99)
Potassium: 5.1 mmol/L (ref 3.5–5.1)
Sodium: 135 mmol/L (ref 135–145)
Total Bilirubin: 0.6 mg/dL (ref 0.3–1.2)
Total Protein: 6.1 g/dL — ABNORMAL LOW (ref 6.5–8.1)

## 2021-02-27 LAB — CBC WITH DIFFERENTIAL/PLATELET
Abs Immature Granulocytes: 0.03 10*3/uL (ref 0.00–0.07)
Basophils Absolute: 0 10*3/uL (ref 0.0–0.1)
Basophils Relative: 0 %
Eosinophils Absolute: 0 10*3/uL (ref 0.0–0.5)
Eosinophils Relative: 0 %
HCT: 26.3 % — ABNORMAL LOW (ref 39.0–52.0)
Hemoglobin: 8.7 g/dL — ABNORMAL LOW (ref 13.0–17.0)
Immature Granulocytes: 1 %
Lymphocytes Relative: 6 %
Lymphs Abs: 0.4 10*3/uL — ABNORMAL LOW (ref 0.7–4.0)
MCH: 29.2 pg (ref 26.0–34.0)
MCHC: 33.1 g/dL (ref 30.0–36.0)
MCV: 88.3 fL (ref 80.0–100.0)
Monocytes Absolute: 0.2 10*3/uL (ref 0.1–1.0)
Monocytes Relative: 4 %
Neutro Abs: 5.3 10*3/uL (ref 1.7–7.7)
Neutrophils Relative %: 89 %
Platelets: 110 10*3/uL — ABNORMAL LOW (ref 150–400)
RBC: 2.98 MIL/uL — ABNORMAL LOW (ref 4.22–5.81)
RDW: 15.7 % — ABNORMAL HIGH (ref 11.5–15.5)
WBC: 6 10*3/uL (ref 4.0–10.5)
nRBC: 0 % (ref 0.0–0.2)

## 2021-02-27 MED ORDER — PREDNISONE 10 MG PO TABS: ORAL_TABLET | ORAL | 0 refills | Status: DC

## 2021-02-27 MED ORDER — ALBUTEROL SULFATE HFA 108 (90 BASE) MCG/ACT IN AERS: 2.0000 | INHALATION_SPRAY | Freq: Four times a day (QID) | RESPIRATORY_TRACT | 2 refills | Status: AC | PRN

## 2021-02-27 NOTE — Telephone Encounter (Signed)
Copied from [place] [id number]. Topic: General - Other >> Feb 27, 2021  8:03 AM Leward Quan A wrote: Reason for CRM: Patient wife Jackelyn Poling  called in to inform Dr Wynetta Emery that patient have been put back on Chemo pills by Dr At the cancer center say that the Rx was increased from 10 MG to 15 MG and it is causing him to cough and causing him to feel sick all the time and weak. She is concerned and asking for some advise from dr Wynetta Emery please call  Ph# [phone]

## 2021-02-27 NOTE — Telephone Encounter (Signed)
Please let him know I'm very happy to see him for his cough to make sure its not an infection- but I'd make sure he tells his oncologist in case it's from his chemo

## 2021-02-27 NOTE — Telephone Encounter (Signed)
Discussed with Josh Borders-recommend evaluation in the emergency room for continued shortness of breath.  Unlikely from [place].  Discontinue Jakafi.  Chest x-ray-concern for abdominal air/history of dialysis catheter.  Recommend evaluation emergency room; discussed with Josh Borders.;

## 2021-02-27 NOTE — Progress Notes (Signed)
Patient here for [facility] oncology follow-up appointment, chest pain last couple of days, cough, SOB, weakness and numbness. Has questions about bursing on arms, NP informed

## 2021-02-27 NOTE — Telephone Encounter (Signed)
Patient has an appt with oncology at 1:15 today.

## 2021-02-27 NOTE — Telephone Encounter (Signed)
Wife called reporting that patient is not doing well since the increase in patient Jakafi dose from  10 mg M,W,F  To 15 mg M,W,F. He is very weak and can hardly walk and has trouble breathing when he lies down flat. He also has developed a cough. She does not understand why the dose was increased. Please advise

## 2021-02-27 NOTE — Telephone Encounter (Signed)
I received a call from radiologist regarding critical result on chest x-ray with findings consistent with pneumoperitoneum. It was felt that the amount of free air was atypical despite peritoneal dialysis.  Discussed with Dr. Rogue Bussing.  I called and spoke with patient and wife regarding results of chest x-ray.  I strongly recommended ER for evaluation.  Wife verbalized understanding of my recommendations but says that she would first like to speak with nephrology before taking patient to ER.  Message sent to Drs. [name]/Johnson.

## 2021-02-27 NOTE — Progress Notes (Signed)
[facility]  [phone] [phone]  Patient Care Team: James Roys, DO as PCP - General (Family Medicine) End, James Gave, MD as PCP - Cardiology (Cardiology) James Sickle, MD as Consulting Physician (Internal Medicine)   Name of the patient: James Arnold  [id number]  [birth year]   Date of visit: 02/27/21  Reason for Consult: [name] is a 65 year old man with multiple medical problems including ESRD on peritoneal dialysis, history of CVA, CAD, diastolic dysfunction with preserved EF, COPD, diabetes, anemia on EPO and IV iron, and JAK2 positive primary myelofibrosis currently on treatment with Jakafi.  Patient last saw Dr. Rogue Arnold on 02/20/2021.  At that time patient was doing reasonably well.  Plan was to continue James Arnold and to have routine follow-up in 1 month.  Jakafi dose was increased on 6/1 to 15 mg 3 times weekly after dialysis.  Patient says that he started taking increased dose of Jakafi on 6/3 and then on 6/5, he started developing runny nose, nasal congestion, cough, and shortness of breath.  He says it he no longer has rhinorrhea but continues to have occasionally productive cough with exertional shortness of breath, which is worse when he attempts to lie flat.  He denies fever or chills.  No sick contacts.  Patient is not COVID vaccinated but did have COVID December 2020.  Denies any neurologic complaints. Denies recent fevers or illnesses. Denies any easy bleeding or bruising. Reports good appetite and denies weight loss. Denies chest pain. Denies any nausea, vomiting, constipation, or diarrhea. Denies urinary complaints. Patient offers no further specific complaints today.  PAST MEDICAL HISTORY: Past Medical History:  Diagnosis Date  . Anemia   . Benign hypertensive renal disease   . CAD (coronary artery disease)    a. 08/1996 s/p BMS to the mLAD ([facility]); b. 12/2017 MV: EF 46%, fixed  apical defect w/ significant GI uptake artifact. No ischemia-> low risk.  . CKD (chronic kidney disease), stage V ([place])    on peritoneal dialysis  . Complication of anesthesia    please call by "James Arnold" when waking up!!  . COPD (chronic obstructive pulmonary disease) ([place])    centrilobular emphysema. pt is poorly controlled with his copd/smoking.  . Depression   . Diastolic dysfunction    a. 12/2017 Echo: EF 60-65%, no rwma, Gr1 DD, mild AI/MR. Nl RVSP.  [name] Kitchen Dyspnea    with exertion  . Fatigue   . FSGS (focal segmental glomerulosclerosis)   . Hyperlipidemia 10/2002  . Hypertension   . myelofibrosis    bone marrow failure  . Myelofibrosis ([place]) 2010   JAK2 (+) primary  . Myocardial infarction ([place]) 1997   stent x 1  . Smoker   . Thrombocytopenia ([place])     PAST SURGICAL HISTORY:  Past Surgical History:  Procedure Laterality Date  . ANGIOPLASTY    . BONE MARROW ASPIRATION  03/2009  . CARDIAC CATHETERIZATION  1997   stent x 1  . COLONOSCOPY WITH PROPOFOL N/A 08/09/2018   Procedure: COLONOSCOPY WITH PROPOFOL;  Surgeon: James Bellows, MD;  Location: [facility];  Service: Gastroenterology;  Laterality: N/A;  . CORONARY STENT PLACEMENT  08/1996  . EXTERIORIZATION OF A CONTINUOUS AMBULATORY PERITONEAL DIALYSIS CATHETER    . EYE SURGERY Bilateral    cats removed  . REVERSE SHOULDER ARTHROPLASTY Left 11/19/2020   Procedure: Left reverse shoulder arthroplasty;  Surgeon: James Fabry, MD;  Location: [facility] ORS;  Service: Orthopedics;  Laterality: Left;  HEMATOLOGY/ONCOLOGY HISTORY:  Oncology History Overview Note  # 2010- PRIMARY MYELOFIBROSIS; Jak-2 positive;cytogenetics- Not done [bmbx- 2010] 2010- spleen- 13cm; Dynamic IPS- LOW [0-risk factor]; [place] 2017 AUG spleen-13cm; March 2019-bone marrow biopsy myelofibrosis/no blasts.   # AUGUST 1st week 2019- Retacrit   # October 2nd 2019- jakafi 10 BID [? Renal involvement]; September 2020-peritoneal dialysis; Jakafi 10 mg in the morning;  STOPPED/tapered James Arnold, 2021 [sec intolerance fatigue/myalgias]  # May 4th 2022- START James Arnold dosing/PD]  # CKD 1.5; poorly controlled HTN; AUG 2018- worsening of renal function Creat ~2.1; AUG 2018- Bil Kidney US- NEG for hydronephrosis. [name]- kidney Bx- FSG [ on Prednisone JamesLateef]; July 2020-peritoneal dialysis;   # hx of Lung nodules- resolved [JamesOakes]/quit smoking.  DIAGNOSIS: PRIMARY MYELOFIBROSIS  RISK:LOW     ;GOALS: Control  CURRENT/MOST RECENT THERAPY : Surveillance    Primary myelofibrosis ([place])    ALLERGIES:  has No Known Allergies.  MEDICATIONS:  Current Outpatient Medications  Medication Sig Dispense Refill  . aspirin 81 MG chewable tablet Chew 1 tablet (81 mg total) by mouth daily. 90 tablet 1  . carvedilol (COREG) 12.5 MG tablet Take 1.5 tablets (18.75 mg total) by mouth 2 (two) times daily. 270 tablet 3  . Cholecalciferol (VITAMIN D-1000 MAX ST) 25 MCG (1000 UT) tablet Take 1,000 Units by mouth daily.     . citalopram (CELEXA) 40 MG tablet Take 1 tablet (40 mg total) by mouth daily. 90 tablet 1  . epoetin alfa-epbx (RETACRIT) 52778 UNIT/ML injection Inject into the skin. Once a month at dialysis    . ferrous sulfate 325 (65 FE) MG tablet Take 1 tablet (325 mg total) by mouth daily with breakfast. 30 tablet 1  . furosemide (LASIX) 20 MG tablet Take 1 tablet (20 mg total) by mouth daily. 90 tablet 1  . gentamicin cream (GARAMYCIN) 0.1 % Apply 1 application topically every other day.    . levETIRAcetam (KEPPRA) 500 MG tablet Take 1 tablet (500 mg total) by mouth at bedtime. 30 tablet 0  . omeprazole (PRILOSEC) 20 MG capsule Take 1 capsule (20 mg total) by mouth daily. 90 capsule 1  . ondansetron (ZOFRAN ODT) 8 MG disintegrating tablet Take 1 tablet (8 mg total) by mouth every 8 (eight) hours as needed for nausea or vomiting. 30 tablet 1  . polyethylene glycol (MIRALAX / GLYCOLAX) 17 g packet Take 17 g by mouth daily as needed. 14 each 0  .  ruxolitinib phosphate (JAKAFI) 15 MG tablet Take 1 pill Monday Wednesday Friday after dialysis. 12 tablet 4   No current facility-administered medications for this visit.    VITAL SIGNS: BP 130/85 (Patient Position: Sitting)   Pulse 76   Temp 99.7 F (37.6 C) (Tympanic)   Resp 18   Wt 151 lb (68.5 kg)   SpO2 95%   BMI 23.65 kg/m  Filed Weights   02/27/21 1413  Weight: 151 lb (68.5 kg)    Estimated body mass index is 23.65 kg/m as calculated from the following:   Height as of 01/23/21: _0  (1.702 m).   Weight as of this encounter: 151 lb (68.5 kg).  LABS: CBC:    Component Value Date/Time   WBC 6.0 02/27/2021 1356   HGB 8.7 (L) 02/27/2021 1356   HGB 9.4 (L) 01/08/2021 0915   HCT 26.3 (L) 02/27/2021 1356   HCT 29.4 (L) 01/08/2021 0915   PLT 110 (L) 02/27/2021 1356   PLT 172 01/08/2021 0915   MCV 88.3 02/27/2021 1356  MCV 91 01/08/2021 0915   MCV 87 05/12/2014 1038   NEUTROABS 5.3 02/27/2021 1356   NEUTROABS 4.1 01/08/2021 0915   NEUTROABS 4.0 05/12/2014 1038   LYMPHSABS 0.4 (L) 02/27/2021 1356   LYMPHSABS 0.4 (L) 01/08/2021 0915   LYMPHSABS 1.0 05/12/2014 1038   MONOABS 0.2 02/27/2021 1356   MONOABS 0.2 05/12/2014 1038   EOSABS 0.0 02/27/2021 1356   EOSABS 0.0 01/08/2021 0915   EOSABS 0.0 05/12/2014 1038   BASOSABS 0.0 02/27/2021 1356   BASOSABS 0.0 01/08/2021 0915   BASOSABS 0.0 05/12/2014 1038   Comprehensive Metabolic Panel:    Component Value Date/Time   NA 138 02/20/2021 0848   NA 138 01/08/2021 0915   K 4.7 02/20/2021 0848   CL 104 02/20/2021 0848   CO2 21 (L) 02/20/2021 0848   BUN 63 (H) 02/20/2021 0848   BUN 63 (H) 01/08/2021 0915   CREATININE 6.78 (H) 02/20/2021 0848   CREATININE 1.12 09/30/2013 0953   GLUCOSE 90 02/20/2021 0848   CALCIUM 7.1 (L) 02/20/2021 0848   AST 20 02/20/2021 0848   ALT 26 02/20/2021 0848   ALKPHOS 41 02/20/2021 0848   BILITOT 0.7 02/20/2021 0848   BILITOT 0.3 01/08/2021 0915   PROT 5.7 (L) 02/20/2021 0848   PROT  5.5 (L) 01/08/2021 0915   ALBUMIN 3.3 (L) 02/20/2021 0848   ALBUMIN 3.7 (L) 01/08/2021 0915    RADIOGRAPHIC STUDIES: No results found.  PERFORMANCE STATUS (ECOG) : 2 - Symptomatic, <50% confined to bed  Review of Systems Unless otherwise noted, a complete review of systems is negative.  Physical Exam General: NAD Cardiovascular: regular rate and rhythm Pulmonary: Coarse, expiratory wheezing anterior/posterior fields Abdomen: soft, nontender, + bowel sounds GU: no suprapubic tenderness Extremities: no edema, no joint deformities Skin: no rashes Neurological: Weakness but otherwise nonfocal  Assessment and Plan- Patient is a 65 y.o. male COPD, ESRD on peritoneal dialysis, and myelofibrosis on Jakafi, who presents to the [facility] today for evaluation of cough and shortness of breath.  Shortness of breath -discussed with Dr. Rogue Arnold and doubt that symptoms are related to [facility] or his myelofibrosis.  His counts are stable today.  SaO2 95% on RA. Afebrile in clinic. Wheezing on exam. Symptomatology seems more consistent with viral etiology +/- COPD exacerbation. Will send for CXR and Covid testing. WIll start patient on albuterol inhaler as needed and increase daily prednisone to 40 mg over the next 5 days with slow taper.  Monitor home CBG. Message sent to PCP for follow-up.   Case and plan discussed with Drs. James Arnold and Johnson   Patient expressed understanding and was in agreement with this plan. He also understands that He can call clinic at any time with any questions, concerns, or complaints.   Thank you for allowing me to participate in the care of this very pleasant patient.   Time Total: 25 minutes  Visit consisted of counseling and education dealing with the complex and emotionally intense issues of symptom management and palliative care in the setting of serious and potentially life-threatening illness.Greater than 50%  of this time was spent counseling and coordinating  care related to the above assessment and plan.  Signed by: Altha Harm, PhD, NP-C

## 2021-02-28 ENCOUNTER — Ambulatory Visit
Admission: RE | Admit: 2021-02-28 | Discharge: 2021-02-28 | Disposition: A | Discharge status: Home / Self Care | Payer: Medicare HMO | Source: Ambulatory Visit | Attending: Nephrology | Admitting: Nephrology

## 2021-02-28 ENCOUNTER — Other Ambulatory Visit: Payer: Self-pay | Admitting: Nephrology

## 2021-02-28 DIAGNOSIS — Z992 Dependence on renal dialysis: Secondary | ICD-10-CM | POA: Diagnosis not present

## 2021-02-28 DIAGNOSIS — N186 End stage renal disease: Secondary | ICD-10-CM | POA: Diagnosis not present

## 2021-02-28 DIAGNOSIS — R109 Unspecified abdominal pain: Secondary | ICD-10-CM | POA: Diagnosis not present

## 2021-02-28 DIAGNOSIS — K5730 Diverticulosis of large intestine without perforation or abscess without bleeding: Secondary | ICD-10-CM | POA: Diagnosis not present

## 2021-02-28 DIAGNOSIS — R140 Abdominal distension (gaseous): Secondary | ICD-10-CM | POA: Diagnosis not present

## 2021-02-28 DIAGNOSIS — K7460 Unspecified cirrhosis of liver: Secondary | ICD-10-CM | POA: Diagnosis not present

## 2021-02-28 DIAGNOSIS — R197 Diarrhea, unspecified: Secondary | ICD-10-CM | POA: Diagnosis not present

## 2021-02-28 DIAGNOSIS — K820 Obstruction of gallbladder: Secondary | ICD-10-CM | POA: Diagnosis not present

## 2021-02-28 NOTE — Telephone Encounter (Signed)
Oncology did not think it was from his chemo- can we please get him a follow up in about 10 days to make sure he's better

## 2021-02-28 NOTE — Telephone Encounter (Signed)
Called pt spoke with wife Jackelyn Poling he is unavailable to schedule right now she states that he was informed yesterday that he needed to go to the hosp for excess air but he refused so they are waiting on Dr Holley Raring to call back about getting a ct scan

## 2021-03-01 ENCOUNTER — Emergency Department
Admission: EM | Admit: 2021-03-01 | Discharge: 2021-03-01 | Disposition: A | Discharge status: Home / Self Care | Payer: Medicare HMO | Attending: Emergency Medicine | Admitting: Emergency Medicine

## 2021-03-01 ENCOUNTER — Other Ambulatory Visit: Payer: Self-pay

## 2021-03-01 ENCOUNTER — Encounter: Payer: Self-pay | Admitting: Emergency Medicine

## 2021-03-01 DIAGNOSIS — K668 Other specified disorders of peritoneum: Secondary | ICD-10-CM | POA: Diagnosis not present

## 2021-03-01 DIAGNOSIS — Z7982 Long term (current) use of aspirin: Secondary | ICD-10-CM | POA: Diagnosis not present

## 2021-03-01 DIAGNOSIS — Z87891 Personal history of nicotine dependence: Secondary | ICD-10-CM | POA: Diagnosis not present

## 2021-03-01 DIAGNOSIS — T85898A Other specified complication of other internal prosthetic devices, implants and grafts, initial encounter: Secondary | ICD-10-CM | POA: Diagnosis not present

## 2021-03-01 DIAGNOSIS — Z4902 Encounter for fitting and adjustment of peritoneal dialysis catheter: Secondary | ICD-10-CM | POA: Insufficient documentation

## 2021-03-01 DIAGNOSIS — I120 Hypertensive chronic kidney disease with stage 5 chronic kidney disease or end stage renal disease: Secondary | ICD-10-CM | POA: Insufficient documentation

## 2021-03-01 DIAGNOSIS — Z79899 Other long term (current) drug therapy: Secondary | ICD-10-CM | POA: Diagnosis not present

## 2021-03-01 DIAGNOSIS — Z9861 Coronary angioplasty status: Secondary | ICD-10-CM | POA: Insufficient documentation

## 2021-03-01 DIAGNOSIS — J449 Chronic obstructive pulmonary disease, unspecified: Secondary | ICD-10-CM | POA: Diagnosis not present

## 2021-03-01 DIAGNOSIS — Z992 Dependence on renal dialysis: Secondary | ICD-10-CM | POA: Diagnosis not present

## 2021-03-01 DIAGNOSIS — N186 End stage renal disease: Secondary | ICD-10-CM | POA: Insufficient documentation

## 2021-03-01 DIAGNOSIS — R109 Unspecified abdominal pain: Secondary | ICD-10-CM | POA: Diagnosis not present

## 2021-03-01 DIAGNOSIS — Z96612 Presence of left artificial shoulder joint: Secondary | ICD-10-CM | POA: Diagnosis not present

## 2021-03-01 DIAGNOSIS — I2510 Atherosclerotic heart disease of native coronary artery without angina pectoris: Secondary | ICD-10-CM | POA: Insufficient documentation

## 2021-03-01 DIAGNOSIS — R1084 Generalized abdominal pain: Secondary | ICD-10-CM

## 2021-03-01 DIAGNOSIS — K669 Disorder of peritoneum, unspecified: Secondary | ICD-10-CM | POA: Diagnosis not present

## 2021-03-01 LAB — CBC
HCT: 27 % — ABNORMAL LOW (ref 39.0–52.0)
Hemoglobin: 9.1 g/dL — ABNORMAL LOW (ref 13.0–17.0)
MCH: 29.9 pg (ref 26.0–34.0)
MCHC: 33.7 g/dL (ref 30.0–36.0)
MCV: 88.8 fL (ref 80.0–100.0)
Platelets: 114 10*3/uL — ABNORMAL LOW (ref 150–400)
RBC: 3.04 MIL/uL — ABNORMAL LOW (ref 4.22–5.81)
RDW: 15.7 % — ABNORMAL HIGH (ref 11.5–15.5)
WBC: 4.1 10*3/uL (ref 4.0–10.5)
nRBC: 0 % (ref 0.0–0.2)

## 2021-03-01 LAB — COMPREHENSIVE METABOLIC PANEL
ALT: 10 U/L (ref 0–44)
AST: 10 U/L — ABNORMAL LOW (ref 15–41)
Albumin: 3.2 g/dL — ABNORMAL LOW (ref 3.5–5.0)
Alkaline Phosphatase: 41 U/L (ref 38–126)
Anion gap: 13 (ref 5–15)
BUN: 60 mg/dL — ABNORMAL HIGH (ref 8–23)
CO2: 23 mmol/L (ref 22–32)
Calcium: 8 mg/dL — ABNORMAL LOW (ref 8.9–10.3)
Chloride: 101 mmol/L (ref 98–111)
Creatinine, Ser: 7.02 mg/dL — ABNORMAL HIGH (ref 0.61–1.24)
GFR, Estimated: 8 mL/min — ABNORMAL LOW (ref >60)
Glucose, Bld: 100 mg/dL — ABNORMAL HIGH (ref 70–99)
Potassium: 4.3 mmol/L (ref 3.5–5.1)
Sodium: 137 mmol/L (ref 135–145)
Total Bilirubin: 0.8 mg/dL (ref 0.3–1.2)
Total Protein: 6.4 g/dL — ABNORMAL LOW (ref 6.5–8.1)

## 2021-03-01 LAB — URINALYSIS, COMPLETE (UACMP) WITH MICROSCOPIC
Bilirubin Urine: NEGATIVE
Glucose, UA: NEGATIVE mg/dL
Hgb urine dipstick: NEGATIVE
Ketones, ur: NEGATIVE mg/dL
Leukocytes,Ua: NEGATIVE
Nitrite: NEGATIVE
Protein, ur: 100 mg/dL — AB
Specific Gravity, Urine: 1.009 (ref 1.005–1.030)
Squamous Epithelial / HPF: NONE SEEN (ref 0–5)
pH: 6 (ref 5.0–8.0)

## 2021-03-01 LAB — BODY FLUID CELL COUNT WITH DIFFERENTIAL
Eos, Fluid: 0 %
Lymphs, Fluid: 12 %
Monocyte-Macrophage-Serous Fluid: 40 %
Neutrophil Count, Fluid: 48 %
Total Nucleated Cell Count, Fluid: 11 cu mm

## 2021-03-01 LAB — PROTEIN, PLEURAL OR PERITONEAL FLUID: Total protein, fluid: <3 g/dL

## 2021-03-01 LAB — LIPASE, BLOOD: Lipase: 28 U/L (ref 11–51)

## 2021-03-01 LAB — ALBUMIN, PLEURAL OR PERITONEAL FLUID: Albumin, Fluid: <1 g/dL

## 2021-03-01 MED ORDER — HEPARIN 1000 UNIT/ML FOR PERITONEAL DIALYSIS
500.0000 [IU] | INTRAMUSCULAR | Status: DC | PRN
  Filled 2021-03-01: qty 0.5

## 2021-03-01 MED ORDER — MORPHINE SULFATE (PF) 4 MG/ML IV SOLN
4.0000 mg | Freq: Once | INTRAVENOUS | Status: DC
  Filled 2021-03-01: qty 1

## 2021-03-01 NOTE — ED Notes (Signed)
Pt's PD fluid sample walked to lab by this RN.

## 2021-03-01 NOTE — Progress Notes (Addendum)
James Arnold, [place] 03/01/21  Subjective:   Hospital day # 0  Patient known to our practice from outpatient peritoneal dialysis.  He came to the emergency room for abdominal pain evaluation.  Yesterday he had a CT of the abdomen without contrast which showed large amount of pneumoperitoneum.  Perforated viscus could not be ruled out.  However patient states that he was able to eat dinner last night and there is no nausea or vomiting. He does have mild diffuse abdominal pain mostly in the left lower quadrant    Objective:  Vital signs in last 24 hours:  Temp:  [97.9 F (36.6 C)] 97.9 F (36.6 C) (06/10 0709) Pulse Rate:  [78-88] 83 (06/10 1200) Resp:  [16-23] 22 (06/10 1200) BP: (136-168)/(67-131) 148/79 (06/10 1200) SpO2:  [92 %-98 %] 95 % (06/10 1205) Weight:  [68 kg] 68 kg (06/10 0717)  Weight change:  Filed Weights   03/01/21 0717  Weight: 68 kg    Intake/Output:   No intake or output data in the 24 hours ending 03/01/21 1235   Physical Exam: General: Laying in the bed, no acute distress  HEENT Anicteric, moist oral mucous membranes  Pulm/lungs Normal breathing effort, no crackles  CVS/Heart No rub or gallop  Abdomen:  Soft, mild left lower quadrant tenderness no rebound or guarding  Extremities: Trace edema  Neurologic: Alert, oriented  Skin: No acute lesions  Access: Peritoneal dialysis catheter in place.  Nontender       Basic Metabolic Panel:  Recent Labs  Lab 02/27/21 1356 03/01/21 0712  NA 135 137  K 5.1 4.3  CL 102 101  CO2 19* 23  GLUCOSE 116* 100*  BUN 65* 60*  CREATININE 7.17* 7.02*  CALCIUM 7.3* 8.0*     CBC: Recent Labs  Lab 02/27/21 1356 03/01/21 0712  WBC 6.0 4.1  NEUTROABS 5.3  --   HGB 8.7* 9.1*  HCT 26.3* 27.0*  MCV 88.3 88.8  PLT 110* 114*     No results found for: HEPBSAG, HEPBSAB, HEPBIGM    Microbiology:  No results found for this or any previous visit (from the past 240  hour(s)).  Coagulation Studies: No results for input(s): LABPROT, INR in the last 72 hours.  Urinalysis: Recent Labs    03/01/21 0719  COLORURINE YELLOW*  LABSPEC 1.009  PHURINE 6.0  GLUCOSEU NEGATIVE  HGBUR NEGATIVE  BILIRUBINUR NEGATIVE  KETONESUR NEGATIVE  PROTEINUR 100*  NITRITE NEGATIVE  LEUKOCYTESUR NEGATIVE      Imaging: CT ABDOMEN PELVIS WO CONTRAST  Result Date: 02/28/2021 CLINICAL DATA:  Generalized abdominal pain and distension. Diarrhea for 3 days. EXAM: CT ABDOMEN AND PELVIS WITHOUT CONTRAST TECHNIQUE: Multidetector CT imaging of the abdomen and pelvis was performed following the standard protocol without IV contrast. COMPARISON:  Multiple exams, including 12/15/2018 FINDINGS: Lower chest: Right coronary artery and descending thoracic aortic atherosclerosis. Hepatobiliary: Hepatic morphology suggests cirrhosis. Contracted gallbladder. Pancreas: Unremarkable Spleen: Splenomegaly is again observed, the spleen measures 14.0 by 7.8 by 14.4 cm (volume = 820 cm^3). No focal splenic lesion is appreciated on today's noncontrast examination. Adrenals/Urinary Tract: Stable hypodense exophytic lesion from the left kidney lower pole is likely a cyst although enhancement characteristics are not interrogated. Stable low-density fullness of the left adrenal gland without discrete mass identified. Stomach/Bowel: Several colonic diverticula at the junction of the descending and sigmoid colon, without findings of inflammation. I not see an obvious focus of bowel injury to explain the large amount of pneumoperitoneum. No appreciable  pneumatosis. No obvious areas of bowel wall thickening Vascular/Lymphatic: Dense aortoiliac atherosclerotic vascular calcification noted. Reproductive: Unremarkable Other: Large amount of pneumoperitoneum especially along the upper abdomen, with extensive scattered gas loculations non dependently in the abdomen but also adjacent to the liver and spleen. A peritoneal  dialysis catheter is in place with catheter tip in the pelvis. Musculoskeletal: Spurring of the SI joints. Transitional L5 vertebra with grade 1 anterolisthesis of L4 on L5 attributed to degenerative facet arthropathy. IMPRESSION: 1. Large amount of pneumoperitoneum, probably from poor connection techniques from peritoneal dialysis catheter given that I do not see an obvious point of perforated viscus. Occult perforated viscus remains a differential diagnostic consideration given the volume of pneumoperitoneum. Correlation with patient symptoms and overall clinical scenario is recommended in assessing for appropriate intervention. 2. Other imaging findings of potential clinical significance: Aortic Atherosclerosis (ICD10-I70.0). Right coronary atherosclerosis. Hepatic cirrhosis with continued splenomegaly. Mild colonic diverticulosis at the junction of the descending and sigmoid colon without inflammatory findings. Electronically Signed   By: Van Clines M.D.   On: 02/28/2021 17:05   DG Chest 2 View  Addendum Date: 02/27/2021   ADDENDUM REPORT: 02/27/2021 16:10 ADDENDUM: Per discussion with the ordering provider, the patient is on peritoneal dialysis. While some of the patient's pneumoperitoneum may be explained by this, the volume of free intraperitoneal gas appears out of proportion to that usually seen in this clinical scenario. Further workup is recommended to evaluate for intra-abdominal pathology. Electronically Signed   By: Maurine Simmering   On: 02/27/2021 16:10   Result Date: 02/27/2021 CLINICAL DATA:  chest pain, states fell acute was going to pass out yesterday with shortness of breath EXAM: CHEST - 2 VIEW COMPARISON:  Radiograph 03/20/2020 FINDINGS: Unchanged cardiomediastinal silhouette. There is no focal airspace disease. There is no pleural effusion or pneumothorax. No acute osseous abnormality. There is a left reverse total shoulder arthroplasty. There is pneumoperitoneum. Partially visualized  subcutaneous catheter overlying the anterior abdominal wall, possibly for peritoneal dialysis access. IMPRESSION: Pneumoperitoneum. No focal airspace consolidation. Critical Value/emergent results were called by telephone at the time of interpretation on 02/27/2021 at 4:03 PM to provider [facility] , who verbally acknowledged these results. Electronically Signed: By: Maurine Simmering On: 02/27/2021 16:03     Medications:     heparin  Assessment/ Plan:  65 y.o. male with end-stage renal disease, peritoneal dialysis, aortic atherosclerosis, right coronary atherosclerosis, hepatic cirrhosis with splenomegaly, mild colonic diverticulosis, history of stroke, coronary disease, diastolic dysfunction with preserved EF, COPD, diabetes mellitus, anemia, JAK2 positive primary myelofibrosis admitted on 03/01/2021 for abd pain  #Abdominal pain with pneumoperitoneum in the setting of ESRD and peritoneal dialysis Discussed with ER physician.  They will obtain general surgery opinion. Peritoneal fluid studies ordered but could not be obtained because patient did not have fluid in the abdomen.  1.5% 1 L dextrose will be instilled and studies will be collected later today    LOS: 0 Caelum Federici 6/10/202212:35 PM  [place], [place]  Note: This note was prepared with Dragon dictation. Any transcription errors are unintentional

## 2021-03-01 NOTE — ED Notes (Signed)
ED Provider at bedside. Patient provided meal.

## 2021-03-01 NOTE — ED Notes (Signed)
Pt visualized in NAD, pt resting in bed, lights dimmed for patient comfort. Pt A&O x 4. Pt denies further needs. Call bell within reach of patient at this time.

## 2021-03-01 NOTE — ED Notes (Signed)
INT in right [facility] placed by previous RN removed. INT catheter intact on removal. Patient noted to have redness and rash under electrodes to patient's chest. Patient denies known allergy to electrodes or adhesive. Dressing applied at patient's request.

## 2021-03-01 NOTE — ED Notes (Signed)
Report from [place], [place]. Patient resting without complaints.

## 2021-03-01 NOTE — ED Provider Notes (Signed)
[facility] Emergency Department Provider Note  ____________________________________________   Event Date/Time   First MD Initiated Contact with Patient 03/01/21 [phone]     (approximate)  I have reviewed the triage vital signs and the nursing notes.   HISTORY  Chief Complaint Abdominal Pain   HPI James Arnold is a 65 y.o. male with multiple medical problems including ESRD on peritoneal dialysis, history of CVA, CAD, diastolic dysfunction with preserved EF, COPD, diabetes, anemia on EPO and IV iron, and JAK2 positive primary myelofibrosis currently on treatment with Jakafi who presents for assessment approximately 4 days of abdominal pain associate with little diarrhea after referred to the ED after being told that routine outpatient CT of his abdomen pelvis yesterday showed a concerning amount of air in his abdomen.  Patient states he has had some generalized abdominal pain the last 4 days has not significantly changed today.  He denies any significant vomiting, chest pain, cough, shortness of breath, fevers, headache, earache, sore throat, back pain or urinary symptoms as he still makes some urine.  No rashes or recent injuries or falls.  States he has been using his PD catheter without any issues.  Denies any other acute concerns at this time         Past Medical History:  Diagnosis Date   Anemia    Benign hypertensive renal disease    CAD (coronary artery disease)    a. 08/1996 s/p BMS to the mLAD ([facility]); b. 12/2017 MV: EF 46%, fixed apical defect w/ significant GI uptake artifact. No ischemia-> low risk.   CKD (chronic kidney disease), stage V ([employer])    on peritoneal dialysis   Complication of anesthesia    please call by "[name]" when waking up!!   COPD (chronic obstructive pulmonary disease) (HCC)    centrilobular emphysema. pt is poorly controlled with his copd/smoking.   Depression    Diastolic dysfunction    a. 12/2017 Echo: EF 60-65%, no rwma,  Gr1 DD, mild AI/MR. Nl RVSP.   Dyspnea    with exertion   Fatigue    FSGS (focal segmental glomerulosclerosis)    Hyperlipidemia 10/2002   Hypertension    myelofibrosis    bone marrow failure   Myelofibrosis ([place]) 2010   JAK2 (+) primary   Myocardial infarction ([place]) 1997   stent x 1   Smoker    Thrombocytopenia (HCC)     Patient Active Problem List   Diagnosis Date Noted   Aortic atherosclerosis ([place]) 12/12/2020   Anemia due to vitamin B12 deficiency    Acute CVA (cerebrovascular accident) ([place])    Anemia of chronic disease    Urinary retention    Focal seizure ([place]) 11/20/2020   Thrombocytopenia ([place]) 11/20/2020   ESRD (end stage renal disease) ([place]) 11/20/2020   Hyperkalemia 11/20/2020   Leukocytosis 11/20/2020   ESRD on peritoneal dialysis ([place]) 11/20/2020   Status post shoulder replacement    Rotator cuff arthropathy 11/19/2020   Tobacco abuse 11/07/2020   Hypertensive heart and chronic kidney disease with heart failure and with stage 5 chronic kidney disease, or end stage renal disease ([place]) 07/10/2020   Dialysis patient ([place]) 01/09/2020   Senile purpura ([place]) 01/09/2020   Centrilobular emphysema ([place]) 09/13/2019   Secondary hyperparathyroidism ([place]) 03/14/2019   BPH (benign prostatic hyperplasia) 10/24/2018   Depression 10/24/2018   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   Anemia of chronic kidney failure, stage 5 ([place]) 12/30/2017   Proteinuria 11/25/2017   Gout 08/27/2017  Primary myelofibrosis (HCC)    Major depression, recurrent ([place])    Benign hypertensive renal disease    CKD (chronic kidney disease) stage 5, GFR less than 15 ml/min (HCC)    Hyperlipidemia 10/23/2002   CAD (coronary artery disease) 08/22/1996    Past Surgical History:  Procedure Laterality Date   ANGIOPLASTY     BONE MARROW ASPIRATION  03/2009   CARDIAC CATHETERIZATION  1997   stent x 1   COLONOSCOPY WITH PROPOFOL N/A 08/09/2018   Procedure: COLONOSCOPY WITH  PROPOFOL;  Surgeon: [name], MD;  Location: [facility] ENDOSCOPY;  Service: Gastroenterology;  Laterality: N/A;   CORONARY STENT PLACEMENT  08/1996   EXTERIORIZATION OF A CONTINUOUS AMBULATORY PERITONEAL DIALYSIS CATHETER     EYE SURGERY Bilateral    cats removed   REVERSE SHOULDER ARTHROPLASTY Left 11/19/2020   Procedure: Left reverse shoulder arthroplasty;  Surgeon: Leim Fabry, MD;  Location: [facility] ORS;  Service: Orthopedics;  Laterality: Left;    Prior to Admission medications   Medication Sig Start Date End Date Taking? Authorizing Provider  albuterol (VENTOLIN HFA) 108 (90 Base) MCG/ACT inhaler Inhale 2 puffs into the lungs every 6 (six) hours as needed for wheezing or shortness of breath. 02/27/21   Borders, Kirt Boys, NP  aspirin 81 MG chewable tablet Chew 1 tablet (81 mg total) by mouth daily. 01/08/21   Johnson, Megan P, DO  carvedilol (COREG) 12.5 MG tablet Take 1.5 tablets (18.75 mg total) by mouth 2 (two) times daily. 09/10/20 09/05/21  Loel Dubonnet, NP  Cholecalciferol (VITAMIN D-1000 MAX ST) 25 MCG (1000 UT) tablet Take 1,000 Units by mouth daily.     [provider]  citalopram (CELEXA) 40 MG tablet Take 1 tablet (40 mg total) by mouth daily. 01/08/21   Johnson, Megan P, DO  epoetin alfa-epbx (RETACRIT) 16109 UNIT/ML injection Inject into the skin. Once a month at dialysis    [provider]  ferrous sulfate 325 (65 FE) MG tablet Take 1 tablet (325 mg total) by mouth daily with breakfast. 12/30/17 04/25/21  Saundra Shelling, MD  furosemide (LASIX) 20 MG tablet Take 1 tablet (20 mg total) by mouth daily. 01/08/21   Park Liter P, DO  gentamicin cream (GARAMYCIN) 0.1 % Apply 1 application topically every other day. 03/18/19   [provider]  levETIRAcetam (KEPPRA) 500 MG tablet Take 1 tablet (500 mg total) by mouth at bedtime. 11/24/20   Loletha Grayer, MD  omeprazole (PRILOSEC) 20 MG capsule Take 1 capsule (20 mg total) by mouth daily. 01/08/21   Johnson, Megan  P, DO  ondansetron (ZOFRAN ODT) 8 MG disintegrating tablet Take 1 tablet (8 mg total) by mouth every 8 (eight) hours as needed for nausea or vomiting. 10/19/20   Verlon Au, NP  polyethylene glycol (MIRALAX / GLYCOLAX) 17 g packet Take 17 g by mouth daily as needed. 11/24/20   Loletha Grayer, MD  predniSONE (DELTASONE) 10 MG tablet Take 4 tablets (40mg ) x 5 days, then take 3 tablets (30mg ) x 5 days, then take 2 tablets (20mg ) x 5 days, then continue 10mg  daily 02/27/21   Borders, Kirt Boys, NP  ruxolitinib phosphate (JAKAFI) 15 MG tablet Take 1 pill Monday Wednesday Friday after dialysis. 02/20/21   Cammie Sickle, MD    Allergies Patient has no known allergies.  Family History  Problem Relation Age of Onset   Stroke Mother        Low BP stroke   Depression Father    Depression  Sister        Breast   Heart disease Other    Breast cancer Other    Lung cancer Other    Ovarian cancer Other    Stomach cancer Other     Social History Social History   Tobacco Use   Smoking status: Former    Packs/day: 0.75    Years: 47.00    Pack years: 35.25    Types: Cigarettes   Smokeless tobacco: Never   Tobacco comments:    trying to cut back  Vaping Use   Vaping Use: Never used  Substance Use Topics   Alcohol use: Not Currently    Comment: rare   Drug use: No    Review of Systems  Review of Systems  Constitutional:  Negative for chills and fever.  HENT:  Negative for sore throat.   Eyes:  Negative for pain.  Respiratory:  Negative for cough and stridor.   Cardiovascular:  Negative for chest pain.  Gastrointestinal:  Positive for abdominal pain and diarrhea. Negative for vomiting.  Genitourinary:  Negative for dysuria.  Musculoskeletal:  Negative for myalgias.  Skin:  Negative for rash.  Neurological:  Negative for seizures, loss of consciousness and headaches.  Psychiatric/Behavioral:  Negative for suicidal ideas.   All other systems reviewed and are negative.     ____________________________________________   PHYSICAL EXAM:  VITAL SIGNS: ED Triage Vitals  Enc Vitals Group     BP 03/01/21 0709 136/67     Pulse Rate 03/01/21 0709 88     Resp 03/01/21 0709 18     Temp 03/01/21 0709 97.9 F (36.6 C)     Temp Source 03/01/21 0709 Oral     SpO2 03/01/21 0709 93 %     Weight 03/01/21 0717 149 lb 14.6 oz (68 kg)     Height 03/01/21 0717 5\' 7"  (1.702 m)     Head Circumference --      Peak Flow --      Pain Score 03/01/21 0717 7     Pain Loc --      Pain Edu? --      Excl. in [place]? --    Vitals:   03/01/21 1330 03/01/21 1500  BP: (!) 167/85 (!) 161/82  Pulse: 80 86  Resp: (!) 27 (!) 23  Temp:    SpO2: 99% 94%   Physical Exam Vitals and nursing note reviewed.  Constitutional:      Appearance: He is well-developed.  HENT:     Head: Normocephalic and atraumatic.     Right Ear: External ear normal.     Left Ear: External ear normal.     Nose: Nose normal.  Eyes:     Conjunctiva/sclera: Conjunctivae normal.  Cardiovascular:     Rate and Rhythm: Normal rate and regular rhythm.     Heart sounds: No murmur heard. Pulmonary:     Effort: Pulmonary effort is normal. No respiratory distress.     Breath sounds: Normal breath sounds.  Abdominal:     Palpations: Abdomen is soft.     Tenderness: There is generalized abdominal tenderness. There is no right CVA tenderness or left CVA tenderness.  Musculoskeletal:     Cervical back: Neck supple.  Skin:    General: Skin is warm and dry.     Capillary Refill: Capillary refill takes less than 2 seconds.  Neurological:     General: No focal deficit present.     Mental Status: He is alert.  Psychiatric:        Mood and Affect: Mood normal.    PD catheter appears in place without surrounding skin changes.  Patient is not peritonitis or guarding. ____________________________________________   LABS (all labs ordered are listed, but only abnormal results are displayed)  Labs Reviewed   COMPREHENSIVE METABOLIC PANEL - Abnormal; Notable for the following components:      Result Value   Glucose, Bld 100 (*)    BUN 60 (*)    Creatinine, Ser 7.02 (*)    Calcium 8.0 (*)    Total Protein 6.4 (*)    Albumin 3.2 (*)    AST 10 (*)    GFR, Estimated 8 (*)    All other components within normal limits  CBC - Abnormal; Notable for the following components:   RBC 3.04 (*)    Hemoglobin 9.1 (*)    HCT 27.0 (*)    RDW 15.7 (*)    Platelets 114 (*)    All other components within normal limits  URINALYSIS, COMPLETE (UACMP) WITH MICROSCOPIC - Abnormal; Notable for the following components:   Color, Urine YELLOW (*)    APPearance CLEAR (*)    Protein, ur 100 (*)    Bacteria, UA RARE (*)    All other components within normal limits  BODY FLUID CULTURE W GRAM STAIN  LIPASE, BLOOD  PROTEIN, PLEURAL OR PERITONEAL FLUID  ALBUMIN, PLEURAL OR PERITONEAL FLUID   BODY FLUID CELL COUNT WITH DIFFERENTIAL  TOTAL BILIRUBIN, BODY FLUID  HEMATOCRIT  CYTOLOGY - NON PAP   ____________________________________________  EKG  Sinus rhythm with a ventricular of 86, PVC, normal axis, unremarkable intervals without evidence of ischemia or significant underlying arrhythmia. ____________________________________________  RADIOLOGY  ED MD interpretation: CT abdomen pelvis obtained yesterday reviewed shows large amount of pneumoperitoneum.  There is also evidence of aortic atherosclerosis and right coronary artery atherosclerosis with hepatic cirrhosis and some splenomegaly.  Diverticulosis without evidence of diverticulitis.  Official radiology report(s): CT ABDOMEN PELVIS WO CONTRAST  Result Date: 02/28/2021 CLINICAL DATA:  Generalized abdominal pain and distension. Diarrhea for 3 days. EXAM: CT ABDOMEN AND PELVIS WITHOUT CONTRAST TECHNIQUE: Multidetector CT imaging of the abdomen and pelvis was performed following the standard protocol without IV contrast. COMPARISON:  Multiple exams, including  12/15/2018 FINDINGS: Lower chest: Right coronary artery and descending thoracic aortic atherosclerosis. Hepatobiliary: Hepatic morphology suggests cirrhosis. Contracted gallbladder. Pancreas: Unremarkable Spleen: Splenomegaly is again observed, the spleen measures 14.0 by 7.8 by 14.4 cm (volume = 820 cm^3). No focal splenic lesion is appreciated on today's noncontrast examination. Adrenals/Urinary Tract: Stable hypodense exophytic lesion from the left kidney lower pole is likely a cyst although enhancement characteristics are not interrogated. Stable low-density fullness of the left adrenal gland without discrete mass identified. Stomach/Bowel: Several colonic diverticula at the junction of the descending and sigmoid colon, without findings of inflammation. I not see an obvious focus of bowel injury to explain the large amount of pneumoperitoneum. No appreciable pneumatosis. No obvious areas of bowel wall thickening Vascular/Lymphatic: Dense aortoiliac atherosclerotic vascular calcification noted. Reproductive: Unremarkable Other: Large amount of pneumoperitoneum especially along the upper abdomen, with extensive scattered gas loculations non dependently in the abdomen but also adjacent to the liver and spleen. A peritoneal dialysis catheter is in place with catheter tip in the pelvis. Musculoskeletal: Spurring of the SI joints. Transitional L5 vertebra with grade 1 anterolisthesis of L4 on L5 attributed to degenerative facet arthropathy. IMPRESSION: 1. Large amount of pneumoperitoneum, probably from poor connection techniques  from peritoneal dialysis catheter given that I do not see an obvious point of perforated viscus. Occult perforated viscus remains a differential diagnostic consideration given the volume of pneumoperitoneum. Correlation with patient symptoms and overall clinical scenario is recommended in assessing for appropriate intervention. 2. Other imaging findings of potential clinical significance:  Aortic Atherosclerosis (ICD10-I70.0). Right coronary atherosclerosis. Hepatic cirrhosis with continued splenomegaly. Mild colonic diverticulosis at the junction of the descending and sigmoid colon without inflammatory findings. Electronically Signed   By: Van Clines M.D.   On: 02/28/2021 17:05    ____________________________________________   PROCEDURES  Procedure(s) performed (including Critical Care):  .1-3 Lead EKG Interpretation  Date/Time: 03/01/2021 3:45 PM Performed by: Lucrezia Starch, MD Authorized by: Lucrezia Starch, MD     Interpretation: normal     ECG rate assessment: normal     Rhythm: sinus rhythm     Ectopy: none     Conduction: normal     ____________________________________________   INITIAL IMPRESSION / ASSESSMENT AND PLAN / ED COURSE      Patient presents with above-stated history exam for assessment of abdominal pain with outpatient  CT obtained outpatient showed pneumoperitoneum.  On arrival patient is afebrile hemodynamically stable.  Has some mild tenderness on his abdomen was not guarding rigidity or clearly peritonitis.  CT obtained yesterday was reviewed by myself and showed shows large amount of pneumoperitoneum.  There is also evidence of aortic atherosclerosis and right coronary artery atherosclerosis with hepatic cirrhosis and some splenomegaly.  Diverticulosis without evidence of diverticulitis.  Lipase today not consistent with acute pancreatitis.  CMP with BUN and creatinine consistent with history of PD but otherwise no significant electrode or metabolic derangements.  No evidence of hepatitis or cholestasis.  CBC without leukocytosis and hemoglobin at baseline.  UA is not suggestive of urinary tract infection.  Discussed patient's presentation and work-up with concerns for possible pneumoperitoneum f from possible peritoneal dialysis air leakage versus perforated viscus bowel with on-call nephrologist and surgeon Drs. Singh and  Rodenberg respectively.  Initial plan is to obtain.  She will fluid studies to assess for any evidence of spontaneous bacterial peritonitis or inflammation.  If these are unremarkable I think it is very unlikely that patient has a perforated bowel as he has no fever or leukocytosis and is not peritonitis.  This will also assess for any evidence of SBP at this point also be ruled out given his abdominal pain.  Per Dr. Candiss Norse we will have dialysis nurses obtain dialysate for peritoneal studies.  These were ordered.  Care patient signed over to oncoming fat approximately 1500.  Plan is to follow-up peritoneal fluid studies and if these are unremarkable likely discharge home with outpatient follow-up.      ____________________________________________   FINAL CLINICAL IMPRESSION(S) / ED DIAGNOSES  Final diagnoses:  Peritoneal dialysis catheter in place [facility])  ESRD (end stage renal disease) ([place])  Pneumoperitoneum  Generalized abdominal pain    Medications  heparin 1000 unit/ml injection 500 Units (has no administration in time range)  morphine 4 MG/ML injection 4 mg (0 mg Intravenous Hold 03/01/21 1456)     ED Discharge Orders     None        Note:  This document was prepared using Dragon voice recognition software and may include unintentional dictation errors.    Lucrezia Starch, MD 03/01/21 [phone]

## 2021-03-01 NOTE — ED Notes (Signed)
Rainbow was sent to lab. 

## 2021-03-01 NOTE — ED Notes (Signed)
Pt visualized in NAD at this time, resting in bed with lights dimmed and TV on. Pt denies any needs, call bell within reach of patient.

## 2021-03-01 NOTE — ED Notes (Signed)
Nephrologist at bedside at this time.

## 2021-03-01 NOTE — ED Notes (Signed)
Per dialysis RN, pt will need to have diasylate instilled then will return later this afternoon to drain.

## 2021-03-01 NOTE — ED Notes (Signed)
Patient is resting comfortably. Dialysis nurse at bedside.

## 2021-03-01 NOTE — ED Notes (Signed)
Patient is resting comfortably. 

## 2021-03-01 NOTE — ED Provider Notes (Signed)
-----------------------------------------   6:32 PM on 03/01/2021 ----------------------------------------- I took over care on this patient from Dr. Tamala Julian.  The patient was pending peritoneal fluid analysis.  This shows no significant PMNs, no fluid, and no protein.  This is not consistent with SBP.  On reassessment, the patient continues to appear comfortable.  He is tolerating p.o. without difficulty.  I counseled him on the results of the work-up.  He feels comfortable going home.  I also called Dr. Christian Mate from general surgery to confirm the plan that if the fluid analysis was negative, that the patient would be appropriate for discharge and he agreed.  Return precautions given, and the patient expresses understanding.   Arta Silence, MD 03/01/21 [phone]

## 2021-03-01 NOTE — ED Notes (Signed)
Pt states was sent by MD for eval due to CT scan he had yesterday showing too much air in his abd. Pt is a PD patient, states does his PD daily over night. Pt A&O x4, pt states has had abd pain x 4 days with "some diarrhea". Pt A&O x4, NAD noted at this time. Pt provided with 2 warm blankets. Call bell within reach of patient at this time.

## 2021-03-01 NOTE — Discharge Instructions (Addendum)
Return to the ER for new, worsening, or persistent abdominal pain, swelling in abdomen, vomiting, fever, diarrhea, blood in the stool, weakness, or any other new or worsening symptoms that concern you.  Follow-up with your regular doctor and kidney doctor within the next week.

## 2021-03-01 NOTE — ED Notes (Signed)
Dialysis staff at bedside to draw peritoneal fluid for labs.

## 2021-03-01 NOTE — ED Notes (Signed)
Dialysis contacted regarding peritoneal fluid specimen orders. Awaiting dialysis RN to contact this RN regarding availability of staff to come collect.

## 2021-03-01 NOTE — ED Notes (Signed)
Awaiting dialysis RN to return and drain fluid at this time .

## 2021-03-01 NOTE — ED Triage Notes (Signed)
Pt comes into the ED via POV c/o abdominal pain.  Pt was sent by his physician who completed a CT on him outpatient yesterday and it shows "too much air in my stomach" per the patient.  Pt does admit to some nausea and vomiting but denies any diarrhea.  Pt ambulatory to triage at this time with even and unlabored respirations.  Pt explains he has had the abd pain x 4 days.

## 2021-03-03 DIAGNOSIS — N186 End stage renal disease: Secondary | ICD-10-CM | POA: Diagnosis not present

## 2021-03-03 DIAGNOSIS — Z992 Dependence on renal dialysis: Secondary | ICD-10-CM | POA: Diagnosis not present

## 2021-03-03 LAB — PROTEIN, BODY FLUID (OTHER): Total Protein, Body Fluid Other: 0.3 g/dL

## 2021-03-04 DIAGNOSIS — Z992 Dependence on renal dialysis: Secondary | ICD-10-CM | POA: Diagnosis not present

## 2021-03-04 DIAGNOSIS — N186 End stage renal disease: Secondary | ICD-10-CM | POA: Diagnosis not present

## 2021-03-05 DIAGNOSIS — N186 End stage renal disease: Secondary | ICD-10-CM | POA: Diagnosis not present

## 2021-03-05 DIAGNOSIS — Z992 Dependence on renal dialysis: Secondary | ICD-10-CM | POA: Diagnosis not present

## 2021-03-05 LAB — CYTOLOGY - NON PAP

## 2021-03-05 LAB — BODY FLUID CULTURE W GRAM STAIN
Culture: NO GROWTH
Gram Stain: NONE SEEN

## 2021-03-06 DIAGNOSIS — Z992 Dependence on renal dialysis: Secondary | ICD-10-CM | POA: Diagnosis not present

## 2021-03-06 DIAGNOSIS — N186 End stage renal disease: Secondary | ICD-10-CM | POA: Diagnosis not present

## 2021-03-07 DIAGNOSIS — N186 End stage renal disease: Secondary | ICD-10-CM | POA: Diagnosis not present

## 2021-03-07 DIAGNOSIS — Z992 Dependence on renal dialysis: Secondary | ICD-10-CM | POA: Diagnosis not present

## 2021-03-08 DIAGNOSIS — Z992 Dependence on renal dialysis: Secondary | ICD-10-CM | POA: Diagnosis not present

## 2021-03-08 DIAGNOSIS — N186 End stage renal disease: Secondary | ICD-10-CM | POA: Diagnosis not present

## 2021-03-09 LAB — TOTAL BILIRUBIN, BODY FLUID: Total bilirubin, fluid: <0.2 mg/dL

## 2021-03-10 DIAGNOSIS — N186 End stage renal disease: Secondary | ICD-10-CM | POA: Diagnosis not present

## 2021-03-10 DIAGNOSIS — Z992 Dependence on renal dialysis: Secondary | ICD-10-CM | POA: Diagnosis not present

## 2021-03-11 DIAGNOSIS — N186 End stage renal disease: Secondary | ICD-10-CM | POA: Diagnosis not present

## 2021-03-11 DIAGNOSIS — Z992 Dependence on renal dialysis: Secondary | ICD-10-CM | POA: Diagnosis not present

## 2021-03-12 DIAGNOSIS — Z992 Dependence on renal dialysis: Secondary | ICD-10-CM | POA: Diagnosis not present

## 2021-03-12 DIAGNOSIS — N186 End stage renal disease: Secondary | ICD-10-CM | POA: Diagnosis not present

## 2021-03-13 ENCOUNTER — Other Ambulatory Visit: Payer: Self-pay

## 2021-03-13 ENCOUNTER — Encounter: Payer: Self-pay | Admitting: Internal Medicine

## 2021-03-13 ENCOUNTER — Ambulatory Visit (INDEPENDENT_AMBULATORY_CARE_PROVIDER_SITE_OTHER): Payer: Medicare HMO | Admitting: Internal Medicine

## 2021-03-13 VITALS — BP 136/80 | HR 71 | Ht 67.0 in | Wt 155.0 lb

## 2021-03-13 DIAGNOSIS — E785 Hyperlipidemia, unspecified: Secondary | ICD-10-CM | POA: Diagnosis not present

## 2021-03-13 DIAGNOSIS — Z8673 Personal history of transient ischemic attack (TIA), and cerebral infarction without residual deficits: Secondary | ICD-10-CM | POA: Diagnosis not present

## 2021-03-13 DIAGNOSIS — I69354 Hemiplegia and hemiparesis following cerebral infarction affecting left non-dominant side: Secondary | ICD-10-CM | POA: Diagnosis not present

## 2021-03-13 DIAGNOSIS — I2510 Atherosclerotic heart disease of native coronary artery without angina pectoris: Secondary | ICD-10-CM

## 2021-03-13 DIAGNOSIS — Z992 Dependence on renal dialysis: Secondary | ICD-10-CM | POA: Diagnosis not present

## 2021-03-13 DIAGNOSIS — N186 End stage renal disease: Secondary | ICD-10-CM

## 2021-03-13 MED ORDER — ROSUVASTATIN CALCIUM 5 MG PO TABS: 5.0000 mg | ORAL_TABLET | ORAL | 3 refills | Status: DC

## 2021-03-13 NOTE — Progress Notes (Signed)
Follow-up Outpatient Visit Date: 03/13/2021  Primary Care Provider: Fidencio, Duddy, DO [place] [address]  Chief Complaint: Follow-up coronary artery disease, HFpEF, and stroke  HPI:  James Arnold is a 65 y.o. 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  He was last seen in our office in March by James Montana, NP, at which time he complained mostly about shoulder pain following left rotator cuff surgery in the late February.  His surgery was complicated by new onset seizure with finding of acute/early subacute cortical infarcts in the right frontal, parietal, and occipital lobes.  TEE and ambulatory cardiac monitoring were discussed but ultimately deferred during that hospitalization due to mobility issues from his recent shoulder surgery.  At his follow-up visit, 14-day event monitor was placed, which showed no evidence of atrial fibrillation.  Mr. Hasten declined TEE as well as statin therapy.  He was not interested in quitting smoking.  Today, James Arnold reports feeling about the same as at his last visit.  He still notes some numbness and tingling throughout his left side.  Strength and movement in the left arm and leg have improved since his stroke.  He denies chest pain, shortness of breath, palpitations, and lightheadedness.  He has experienced occasional swelling in his lower extremities.  Home blood pressures have been labile, up to 546 mmHg systolic.  He continues to smoke.  --------------------------------------------------------------------------------------------------  Past Medical History:  Diagnosis Date   Anemia    Benign hypertensive renal disease    CAD (coronary artery disease)    a. 08/1996 s/p BMS to the mLAD ([facility]); b. 12/2017 MV: EF 46%, fixed  apical defect w/ significant GI uptake artifact. No ischemia-> low risk.   CKD (chronic kidney disease), stage V ([place])    on peritoneal dialysis   Complication of anesthesia    please call by "James Arnold" when waking up!!   COPD (chronic obstructive pulmonary disease) (HCC)    centrilobular emphysema. pt is poorly controlled with his copd/smoking.   Depression    Diastolic dysfunction    a. 12/2017 Echo: EF 60-65%, no rwma, Gr1 DD, mild AI/MR. Nl RVSP.   Dyspnea    with exertion   Fatigue    FSGS (focal segmental glomerulosclerosis)    Hyperlipidemia 10/2002   Hypertension    myelofibrosis    bone marrow failure   Myelofibrosis ([place]) 2010   JAK2 (+) primary   Myocardial infarction ([place]) 1997   stent x 1   Smoker    Thrombocytopenia ([place])    Past Surgical History:  Procedure Laterality Date   ANGIOPLASTY     BONE MARROW ASPIRATION  03/2009   CARDIAC CATHETERIZATION  1997   stent x 1   COLONOSCOPY WITH PROPOFOL N/A 08/09/2018   Procedure: COLONOSCOPY WITH PROPOFOL;  Surgeon: James Bellows, MD;  Location: [facility] ENDOSCOPY;  Service: Gastroenterology;  Laterality: N/A;   CORONARY STENT PLACEMENT  08/1996   EXTERIORIZATION OF A CONTINUOUS AMBULATORY PERITONEAL DIALYSIS CATHETER     EYE SURGERY Bilateral    cats removed   REVERSE SHOULDER ARTHROPLASTY Left 11/19/2020   Procedure: Left reverse shoulder arthroplasty;  Surgeon: James Fabry, MD;  Location: [facility] ORS;  Service: Orthopedics;  Laterality: Left;    Current Meds  Medication Sig   albuterol (VENTOLIN HFA) 108 (90 Base) MCG/ACT  inhaler Inhale 2 puffs into the lungs every 6 (six) hours as needed for wheezing or shortness of breath.   aspirin 81 MG chewable tablet Chew 1 tablet (81 mg total) by mouth daily.   carvedilol (COREG) 12.5 MG tablet Take 1.5 tablets (18.75 mg total) by mouth 2 (two) times daily.   Cholecalciferol (VITAMIN D-1000 MAX ST) 25 MCG (1000 UT) tablet Take 1,000 Units by mouth daily.    citalopram (CELEXA) 40 MG  tablet Take 1 tablet (40 mg total) by mouth daily.   epoetin alfa-epbx (RETACRIT) 36629 UNIT/ML injection Inject into the skin. Once a month at dialysis   epoetin alfa-epbx (RETACRIT) 47654 UNIT/ML injection 10,000 Units every 30 (thirty) days. At dialysis   ferrous sulfate 325 (65 FE) MG tablet Take 1 tablet (325 mg total) by mouth daily with breakfast.   furosemide (LASIX) 20 MG tablet Take 1 tablet (20 mg total) by mouth daily.   gentamicin cream (GARAMYCIN) 0.1 % Apply 1 application topically every other day.   levETIRAcetam (KEPPRA) 500 MG tablet Take 1 tablet (500 mg total) by mouth at bedtime.   omeprazole (PRILOSEC) 20 MG capsule Take 1 capsule (20 mg total) by mouth daily.   ondansetron (ZOFRAN ODT) 8 MG disintegrating tablet Take 1 tablet (8 mg total) by mouth every 8 (eight) hours as needed for nausea or vomiting.   polyethylene glycol (MIRALAX / GLYCOLAX) 17 g packet Take 17 g by mouth daily as needed.   predniSONE (DELTASONE) 10 MG tablet Take 10 mg by mouth daily with breakfast.   ruxolitinib phosphate (JAKAFI) 15 MG tablet Take 1 pill Monday Wednesday Friday after dialysis.    Allergies: Patient has no known allergies.  Social History   Tobacco Use   Smoking status: Every Day    Packs/day: 0.75    Years: 47.00    Pack years: 35.25    Types: Cigarettes   Smokeless tobacco: Never   Tobacco comments:    trying to cut back-smokes 6 to 7 cigs per day  Vaping Use   Vaping Use: Never used  Substance Use Topics   Alcohol use: Yes    Comment: "6 beers weekly on Saturdays"   Drug use: No    Family History  Problem Relation Age of Onset   Stroke Mother        Low BP stroke   Depression Father    Depression Sister        Breast   Heart disease Other    Breast cancer Other    Lung cancer Other    Ovarian cancer Other    Stomach cancer Other     Review of Systems: James Arnold notes occasional dysphagia.  He denies prior instrumentation of the esophagus and stomach.   Otherwise, a 12-system review of systems was performed and was negative except as noted in the HPI.  --------------------------------------------------------------------------------------------------  Physical Exam: BP 136/80 (BP Location: Left Arm, Patient Position: Sitting, Cuff Size: Normal)   Pulse 71   Ht 5\' 7"  (1.702 m)   Wt 155 lb (70.3 kg)   SpO2 96%   BMI 24.28 kg/m   General:  NAD. Neck: No JVD or HJR. Lungs: Mildly diminished breath sounds throughout without wheezes or crackles. Heart: Regular rate and rhythm without murmurs, rubs, or gallops. Abdomen: Soft, nontender, nondistended.  Peritoneal dialysis catheter in place. Extremities: No lower extremity edema.  EKG: Normal sinus rhythm with borderline LVH and mild QT prolongation (QTc 486 ms).  PVC no longer present.  Otherwise, no significant change since 03/01/2021.  Lab Results  Component Value Date   WBC 4.1 03/01/2021   HGB 9.1 (L) 03/01/2021   HCT 27.0 (L) 03/01/2021   MCV 88.8 03/01/2021   PLT 114 (L) 03/01/2021    Lab Results  Component Value Date   NA 137 03/01/2021   K 4.3 03/01/2021   CL 101 03/01/2021   CO2 23 03/01/2021   BUN 60 (H) 03/01/2021   CREATININE 7.02 (H) 03/01/2021   GLUCOSE 100 (H) 03/01/2021   ALT 10 03/01/2021    Lab Results  Component Value Date   CHOL 172 01/08/2021   HDL 32 (L) 01/08/2021   LDLCALC 117 (H) 01/08/2021   TRIG 124 01/08/2021   CHOLHDL 5.7 11/21/2020    --------------------------------------------------------------------------------------------------  ASSESSMENT AND PLAN: Coronary artery disease: No angina reported.  Continue current regimen for secondary prevention.  Add low-dose rosuvastatin, as below.  Smoking cessation encouraged.  Stroke: Multifocal stroke with residual left-sided paresthesias and weakness noted.  No new focal neurologic deficits reported by the patient.  Recent 14-day event monitor showed no evidence of atrial fibrillation.  We  discussed proceeding with TEE again today, which Mr. Krager wishes to review with his wife (who was not present today) before agreeing to proceed.  Hyperlipidemia: Mr. Dulworth has previously been reluctant to rechallenge with statin therapy but is agreeable to a trial of low-dose rosuvastatin.  Given that his LDL was reasonably low at 117 in April, we have agreed to a trial of rosuvastatin 5 mg on Mondays, Wednesdays, and Fridays for target LDL less than 70.  Lodema Parma-stage renal disease: Continue peritoneal dialysis and close follow-up with nephrology.  Labile hypertension: Blood pressure borderline elevated today.  Fluctuations noted at home in the setting of peritoneal dialysis.  Defer medication changes today.  Follow-up: Return to clinic in 3 months.  Nelva Bush, MD 03/13/2021 11:39 AM

## 2021-03-13 NOTE — Patient Instructions (Signed)
Medication Instructions:  Your physician has recommended you make the following change in your medication:   START Rosuvastatin (Crestor) 5mg  every Monday, Wednesday, Friday  *If you need a refill on your cardiac medications before your next appointment, please call your pharmacy*   Lab Work:  None ordered  Testing/Procedures:  None ordered   Follow-Up: At [facility], you and your health needs are our priority.  As part of our continuing mission to provide you with exceptional heart care, we have created designated Provider Care Teams.  These Care Teams include your primary Cardiologist (physician) and Advanced Practice Providers (APPs -  Physician Assistants and Nurse Practitioners) who all work together to provide you with the care you need, when you need it.  We recommend signing up for the patient portal called "MyChart".  Sign up information is provided on this After Visit Summary.  MyChart is used to connect with patients for Virtual Visits (Telemedicine).  Patients are able to view lab/test results, encounter notes, upcoming appointments, etc.  Non-urgent messages can be sent to your provider as well.   To learn more about what you can do with MyChart, go to [email or web address].    Your next appointment:   3 month(s)  The format for your next appointment:   In Person  Provider:   You may see Nelva Bush, MD or one of the following Advanced Practice Providers on your designated Care Team:   Murray Hodgkins, NP Christell Faith, PA-C Marrianne Mood, PA-C Cadence Kathlen Mody, [place] Laurann Montana, NP   Other Instructions   Transesophageal Echocardiogram Transesophageal echocardiogram (TEE) is a test that uses sound waves to take pictures of your heart. TEE is done by passing a small probe attached to a flexible tube down the part of the body that moves food from your mouth to your stomach (esophagus). The pictures give clear images of your heart. This can help your  doctor seeif there are problems with your heart. Tell a doctor about: Any allergies you have. All medicines you are taking. This includes vitamins, herbs, eye drops, creams, and over-the-counter medicines. Any problems you or family members have had with anesthetic medicines. Any blood disorders you have. Any surgeries you have had. Any medical conditions you have. Any swallowing problems. Whether you have or have had a blockage in the part of the body that moves food from your mouth to your stomach. Whether you are pregnant or may be pregnant. What are the risks? In general, this is a safe procedure. But, problems may occur, such as: Damage to nearby structures or organs. A tear in the part of the body that moves food from your mouth to your stomach. Irregular heartbeat. Hoarse voice or trouble swallowing. Bleeding. What happens before the procedure? Medicines Ask your doctor about changing or stopping: Your normal medicines. Vitamins, herbs, and supplements. Over-the-counter medicines. Do not take aspirin or ibuprofen unless you are told to. General instructions Follow instructions from your doctor about what you cannot eat or drink. You will take out any dentures or dental retainers. Plan to have a responsible adult take you home from the hospital or clinic. Plan to have a responsible adult care for you for the time you are told after you leave the hospital or clinic. This is important. What happens during the procedure?  An IV will be put into one of your veins. You may be given: A sedative. This medicine helps you relax. A medicine to numb the back of your throat. This may be  sprayed or gargled. Your blood pressure, heart rate, and breathing will be watched. You may be asked to lie on your left side. A bite block will be placed in your mouth. This keeps you from biting the tube. The tip of the probe will be placed into the back of your mouth. You will be asked to  swallow. Your doctor will take pictures of your heart. The probe and bite block will be taken out after the test is done. The procedure may vary among doctors and hospitals. What can I expect after the procedure? You will be monitored until you leave the hospital or clinic. This includes checking your blood pressure, heart rate, breathing rate, and blood oxygen level. Your throat may feel sore and numb. This will get better over time. You will not be allowed to eat or drink until the numbness has gone away. It is common to have a sore throat for a day or two. It is up to you to get the results of your procedure. Ask how to get your results when they are ready. Follow these instructions at home: If you were given a sedative during your procedure, do not drive or use machines until your doctor says that it is safe. Return to your normal activities when your doctor says that it is safe. Keep all follow-up visits. Summary TEE is a test that uses sound waves to take pictures of your heart. You will be given a medicine to help you relax. Do not drive or use machines until your doctor says that it is safe. This information is not intended to replace advice given to you by your health care provider. Make sure you discuss any questions you have with your healthcare provider. Document Revised: 05/01/2020 Document Reviewed: 05/01/2020 Elsevier Patient Education  2022 [employer].

## 2021-03-14 ENCOUNTER — Ambulatory Visit: Payer: Medicare HMO | Admitting: Internal Medicine

## 2021-03-14 ENCOUNTER — Encounter: Payer: Self-pay | Admitting: Internal Medicine

## 2021-03-14 ENCOUNTER — Other Ambulatory Visit (HOSPITAL_COMMUNITY): Payer: Self-pay

## 2021-03-14 DIAGNOSIS — N186 End stage renal disease: Secondary | ICD-10-CM | POA: Diagnosis not present

## 2021-03-14 DIAGNOSIS — Z992 Dependence on renal dialysis: Secondary | ICD-10-CM | POA: Diagnosis not present

## 2021-03-14 DIAGNOSIS — Z8673 Personal history of transient ischemic attack (TIA), and cerebral infarction without residual deficits: Secondary | ICD-10-CM | POA: Insufficient documentation

## 2021-03-15 DIAGNOSIS — Z992 Dependence on renal dialysis: Secondary | ICD-10-CM | POA: Diagnosis not present

## 2021-03-15 DIAGNOSIS — N186 End stage renal disease: Secondary | ICD-10-CM | POA: Diagnosis not present

## 2021-03-17 DIAGNOSIS — Z992 Dependence on renal dialysis: Secondary | ICD-10-CM | POA: Diagnosis not present

## 2021-03-17 DIAGNOSIS — N186 End stage renal disease: Secondary | ICD-10-CM | POA: Diagnosis not present

## 2021-03-18 DIAGNOSIS — N186 End stage renal disease: Secondary | ICD-10-CM | POA: Diagnosis not present

## 2021-03-18 DIAGNOSIS — Z992 Dependence on renal dialysis: Secondary | ICD-10-CM | POA: Diagnosis not present

## 2021-03-19 DIAGNOSIS — Z992 Dependence on renal dialysis: Secondary | ICD-10-CM | POA: Diagnosis not present

## 2021-03-19 DIAGNOSIS — N186 End stage renal disease: Secondary | ICD-10-CM | POA: Diagnosis not present

## 2021-03-20 DIAGNOSIS — N186 End stage renal disease: Secondary | ICD-10-CM | POA: Diagnosis not present

## 2021-03-20 DIAGNOSIS — Z992 Dependence on renal dialysis: Secondary | ICD-10-CM | POA: Diagnosis not present

## 2021-03-21 ENCOUNTER — Ambulatory Visit: Payer: Medicare HMO | Attending: Neurology

## 2021-03-21 ENCOUNTER — Other Ambulatory Visit: Payer: Self-pay

## 2021-03-21 DIAGNOSIS — R278 Other lack of coordination: Secondary | ICD-10-CM | POA: Diagnosis not present

## 2021-03-21 DIAGNOSIS — M6281 Muscle weakness (generalized): Secondary | ICD-10-CM | POA: Diagnosis not present

## 2021-03-21 DIAGNOSIS — N186 End stage renal disease: Secondary | ICD-10-CM | POA: Diagnosis not present

## 2021-03-21 DIAGNOSIS — Z992 Dependence on renal dialysis: Secondary | ICD-10-CM | POA: Diagnosis not present

## 2021-03-21 NOTE — Therapy (Signed)
[place] MAIN [facility] SERVICES [address] [place], [place], [address] Phone: [phone]   Fax:  [phone]  Occupational Therapy Evaluation  Patient Details  Name: James Arnold MRN: [id number] Date of Birth: July 12, 1956 Referring Provider (OT): Dr. Manuella Ghazi   Encounter Date: 03/21/2021   OT End of Session - 03/21/21 1725     Visit Number 1    Number of Visits 12    Date for OT Re-Evaluation 06/12/21    Authorization Type Reporting period beginning 03/21/2021    OT Start Time 0802    OT Stop Time 0855    OT Time Calculation (min) 53 min    Activity Tolerance Patient tolerated treatment well    Behavior During Therapy [facility] for tasks assessed/performed             Past Medical History:  Diagnosis Date   Anemia    Benign hypertensive renal disease    CAD (coronary artery disease)    a. 08/1996 s/p BMS to the mLAD ([facility]); b. 12/2017 MV: EF 46%, fixed apical defect w/ significant GI uptake artifact. No ischemia-> low risk.   CKD (chronic kidney disease), stage V ([place])    on peritoneal dialysis   Complication of anesthesia    please call by "[name]" when waking up!!   COPD (chronic obstructive pulmonary disease) (HCC)    centrilobular emphysema. pt is poorly controlled with his copd/smoking.   Depression    Diastolic dysfunction    a. 12/2017 Echo: EF 60-65%, no rwma, Gr1 DD, mild AI/MR. Nl RVSP.   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   Myocardial infarction ([place]) 1997   stent x 1   Smoker    Thrombocytopenia ([place])     Past Surgical History:  Procedure Laterality Date   ANGIOPLASTY     BONE MARROW ASPIRATION  03/2009   CARDIAC CATHETERIZATION  1997   stent x 1   COLONOSCOPY WITH PROPOFOL N/A 08/09/2018   Procedure: COLONOSCOPY WITH PROPOFOL;  Surgeon:  [name], MD;  Location[facility] ENDOSCOPY;  Service: Gastroenterology;  Laterality: N/A;   CORONARY STENT PLACEMENT  08/1996   EXTERIORIZATION OF A CONTINUOUS AMBULATORY PERITONEAL DIALYSIS CATHETER     EYE SURGERY Bilateral    cats removed   REVERSE SHOULDER ARTHROPLASTY Left 11/19/2020   Procedure: Left reverse shoulder arthroplasty;  Surgeon: Leim Fabry, MD;  Location: [facility] ORS;  Service: Orthopedics;  Laterality: Left;    There were no vitals filed for this visit.   Subjective Assessment - 03/21/21 1705     Subjective  "This hand shakes when I try to use it.  I'm afraid of dropping things."    Pertinent History L reverse TSA on 11/19/2020    Patient Stated Goals "I want to use my arm better."    Currently in Pain? No/denies    Pain Score 0-No pain               [facility] OT Assessment - 03/21/21 0001       Assessment   Medical Diagnosis CVA    Referring Provider (OT) Dr. 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    Leisure "piddling in the yard."      ADL   Grooming Independent    Upper Body Bathing Independent    Agricultural engineer Modified independent    ADL comments modified indep with  basic ADLs      IADL   Prior Level of Function Meal Prep indep    Meal Prep Able to complete simple cold meal and snack prep   difficulty holding coffee cup in L hand     Mobility   Mobility Status Independent      Written Expression   Dominant Hand Right      Vision - History   Baseline Vision Wears glasses only for reading      Vision Assessment   Tracking/Visual Pursuits Able to track stimulus in all quads without difficulty    Saccades Within functional limits    Visual Fields No apparent deficits      Cognition   Overall Cognitive Status Within Functional Limits for tasks assessed      Observation/Other Assessments   Focus on Therapeutic Outcomes (FOTO)  57      Sensation   Light Touch Impaired by gross assessment    Stereognosis Appears Intact    Additional Comments pt reports tingling throughout LUE      Coordination   Gross Motor Movements are Fluid and Coordinated No    Fine Motor Movements are Fluid and Coordinated No    Right 9 Hole Peg Test 22 sec    Left 9 Hole Peg Test 44 sec    Coordination "Pt reports L hand shakes with functional activity      AROM   Overall AROM Comments R shoulder flex 0-130, L 0-110, R abd 0-90, L 0-80 (recent L Reverse TSA, pt reports he also has a bed R shoulder which was recommended for surgery      Strength   Overall Strength Comments bilat shoulder flex 3+, L shoulder IR 4-, L shoulder ER 3+, R IR/ER 4-, bilat elbow and wrist flex 4+      Hand Function   Right Hand Grip (lbs) 58    Right Hand Lateral Pinch 15 lbs    Right Hand 3 Point Pinch 16 lbs    Left Hand Grip (lbs) 23    Left Hand Lateral Pinch 10 lbs    Left 3 point pinch 11 lbs            Occupational Therapy Evaluation: Pt is a 65 y/o male referred to outpatient OT following a CVA.  Pt had L reverse TSA late Feb of this year which was  complicated by new onset seizure and acute infarcts in R frontal, parietal, and occipital lobes.  Pt presents today with L  shoulder weakness from recent surgery, impaired L hand strength and coordination, and diminished sensation to light touch throughout LUE, but more predominantly around shoulder, all deficits noted limiting self care performance.  Pt reports, "This arm shakes.  I have to get it to stop shaking so I can use it better."  Pt reportedly had 4-5 weeks of home health following L shoulder sx and stroke, and now presents to outpatient OT for continued focus on improving strength and coordination in LUE following CVA.  Pt agreeable to 1x per week as he reports multiple medical appointments, and will benefit from OT to address strength and coordination deficits noted above which are limiting self care/IADLs/leisure.     OT Education - 03/21/21 1725     Education Details OT role/goals/poc, theraputty exercises    Person(s) Educated Patient    Methods Explanation;Verbal cues    Comprehension Verbalized understanding;Returned demonstration              OT Short Term Goals - 03/21/21 1727       OT SHORT TERM GOAL #1   Title Pt will be indep with HEP for improving strength and coordination in LUE for improved ADL performance.    Baseline Eval: issued theraputty and instructed in grasping and pinching exercises.  Will continue to progress HEP with additional visits.    Time 6    Period Weeks    Status New    Target Date 05/01/21               OT Long Term Goals - 03/21/21 1728       OT LONG TERM GOAL #1   Title Pt will be able to clip his fingernails independently.    Baseline Eval: Difficulty clipping R hand fingernails d/t decreased coordination to L hand.    Time 12    Period Weeks    Status New    Target Date 06/12/21      OT LONG TERM GOAL #2   Title Pt will be able to manage his dialysis supplies and set up efficiently and independently.    Baseline Eval: extra time required d/t decreased coordination in L hand.    Time 12    Period Weeks    Status New    Target Date 06/12/21       OT LONG TERM GOAL #3   Title Pt will be able to confidently hold his coffee cup with L hand without fear of dropping cup.    Baseline Eval: unable to hold coffee cup in L hand.    Time 12    Period Weeks    Status New    Target Date 06/12/21      OT LONG TERM GOAL #4   Title Pt will improve FOTO score to 59 or better to indicate increased functional performance .    Baseline Eval: 57    Time 12    Period Weeks    Status New    Target Date 06/12/21             Patient will benefit from skilled therapeutic intervention in order to improve the following deficits and impairments:   Body Structure / Function / Physical Skills: ADL, Coordination, GMC, UE functional use, Sensation, Body mechanics, Flexibility, IADL, Pain, Dexterity, FMC, Strength, ROM Visit Diagnosis: Other lack of coordination  Muscle weakness (generalized)  Problem List Patient Active Problem List   Diagnosis Date Noted   History of CVA (cerebrovascular accident) 03/14/2021   Aortic atherosclerosis ([place]) 12/12/2020   Anemia due to vitamin B12 deficiency    Acute CVA (cerebrovascular accident) ([place])    Anemia of chronic disease    Urinary retention    Focal seizure ([place]) 11/20/2020   Thrombocytopenia ([place]) 11/20/2020   ESRD (end stage renal disease) ([place]) 11/20/2020   Hyperkalemia 11/20/2020   Leukocytosis 11/20/2020   ESRD on peritoneal dialysis ([place]) 11/20/2020   Status post shoulder replacement    Rotator cuff arthropathy 11/19/2020   Tobacco abuse 11/07/2020   Hypertensive heart and chronic kidney disease with heart failure and with stage 5 chronic kidney disease, or end stage renal disease ([place]) 07/10/2020   Dialysis patient ([place]) 01/09/2020   Senile purpura ([place]) 01/09/2020   Centrilobular emphysema ([place]) 09/13/2019   Secondary hyperparathyroidism ([place]) 03/14/2019   BPH (benign prostatic hyperplasia) 10/24/2018   Depression 10/24/2018   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    Anemia of chronic kidney failure, stage 5 ([place]) 12/30/2017   Proteinuria 11/25/2017   Gout 08/27/2017   Primary myelofibrosis ([place])    Major depression, recurrent (HCC)    Benign hypertensive renal disease    CKD (chronic kidney disease) stage 5, GFR less than 15 ml/min (HCC)    Hyperlipidemia LDL goal <70 10/23/2002   CAD (coronary artery disease) 08/22/1996    Plan - 03/21/21 1742     Clinical Impression Statement Pt is a 65 y/o male referred to outpatient OT following a CVA.  Pt had L reverse TSA late Feb of this year which was complicated by new onset seizure and acute infarcts in R frontal, parietal, and occipital lobes.  Pt presents today with L shoulder weakness from recent surgery, impaired L hand strength and coordination, and diminished sensation to light touch throughout LUE, but more predominantly around shoulder, all deficits noted limiting self care performance.  Pt reports, "This arm shakes.  I have to get it to stop shaking so I can use it better."  Pt reportedly had 4-5 weeks of home health following L shoulder sx and stroke, and now presents to outpatient OT for continued focus on improving strength and coordination in LUE following CVA.  Pt agreeable to 1x per week as he reports multiple medical appointments, and will benefit from OT to address strength and coordination deficits noted above which are limiting self care/IADLs/leisure.    OT Occupational Profile and History Detailed Assessment- Review of Records and additional review of physical, cognitive, psychosocial history related to current functional performance    Occupational performance deficits (Please refer to evaluation for details): ADL's;IADL's;Leisure    Body Structure / Function / Physical Skills ADL;Coordination;GMC;UE functional use;Sensation;Body mechanics;Flexibility;IADL;Pain;Dexterity;FMC;Strength;ROM    Rehab Potential Good    Clinical Decision Making Several treatment options, min-mod task modification  necessary    Comorbidities Affecting Occupational Performance: May have comorbidities impacting occupational performance    Modification or Assistance to Complete Evaluation  No modification of tasks or assist necessary to complete eval    OT Frequency 1x / week    OT Duration 12 weeks    OT Treatment/Interventions Self-care/ADL training;Therapeutic exercise;DME and/or AE instruction;Neuromuscular education;Manual Therapy;Passive range of motion;Therapeutic activities;Patient/family education    Consulted and Agree with Plan of Care Patient              Leta Speller, MS, OTR/L  Darleene Cleaver 03/21/2021, 5:44 PM  [place]  [place] MAIN [facility] SERVICES [place], [place], [address] Phone: [phone]   Fax:  [phone]  Name: James Arnold MRN: [id number] Date of Birth: 1955/11/10

## 2021-03-22 DIAGNOSIS — Z992 Dependence on renal dialysis: Secondary | ICD-10-CM | POA: Diagnosis not present

## 2021-03-22 DIAGNOSIS — N186 End stage renal disease: Secondary | ICD-10-CM | POA: Diagnosis not present

## 2021-03-24 DIAGNOSIS — N186 End stage renal disease: Secondary | ICD-10-CM | POA: Diagnosis not present

## 2021-03-24 DIAGNOSIS — Z992 Dependence on renal dialysis: Secondary | ICD-10-CM | POA: Diagnosis not present

## 2021-03-25 DIAGNOSIS — N186 End stage renal disease: Secondary | ICD-10-CM | POA: Diagnosis not present

## 2021-03-25 DIAGNOSIS — Z992 Dependence on renal dialysis: Secondary | ICD-10-CM | POA: Diagnosis not present

## 2021-03-26 ENCOUNTER — Other Ambulatory Visit: Payer: Self-pay

## 2021-03-26 ENCOUNTER — Ambulatory Visit: Payer: Medicare HMO | Attending: Neurology

## 2021-03-26 ENCOUNTER — Other Ambulatory Visit: Payer: Self-pay | Admitting: *Deleted

## 2021-03-26 DIAGNOSIS — R278 Other lack of coordination: Secondary | ICD-10-CM | POA: Diagnosis not present

## 2021-03-26 DIAGNOSIS — Z992 Dependence on renal dialysis: Secondary | ICD-10-CM | POA: Diagnosis not present

## 2021-03-26 DIAGNOSIS — M6281 Muscle weakness (generalized): Secondary | ICD-10-CM | POA: Diagnosis not present

## 2021-03-26 DIAGNOSIS — N186 End stage renal disease: Secondary | ICD-10-CM | POA: Diagnosis not present

## 2021-03-26 DIAGNOSIS — D471 Chronic myeloproliferative disease: Secondary | ICD-10-CM

## 2021-03-26 NOTE — Therapy (Signed)
[place] MAIN [facility] SERVICES [address] [place], [place], [address] Phone: [phone]   Fax:  [phone]  Occupational Therapy Treatment  Patient Details  Name: James Arnold MRN: [id number] Date of Birth: [birth year] Referring Provider (OT): Dr. Manuella Ghazi   Encounter Date: 03/26/2021   OT End of Session - 03/26/21 1426     Visit Number 2    Number of Visits 12    Date for OT Re-Evaluation 06/12/21    Authorization Type Reporting period beginning 03/21/2021    OT Start Time 1147    OT Stop Time 1230    OT Time Calculation (min) 43 min    Activity Tolerance Patient tolerated treatment well    Behavior During Therapy [facility] for tasks assessed/performed             Past Medical History:  Diagnosis Date   Anemia    Benign hypertensive renal disease    CAD (coronary artery disease)    a. 08/1996 s/p BMS to the mLAD ([facility]); b. 12/2017 MV: EF 46%, fixed apical defect w/ significant GI uptake artifact. No ischemia-> low risk.   CKD (chronic kidney disease), stage V ([place])    on peritoneal dialysis   Complication of anesthesia    please call by "[name]" when waking up!!   COPD (chronic obstructive pulmonary disease) (HCC)    centrilobular emphysema. pt is poorly controlled with his copd/smoking.   Depression    Diastolic dysfunction    a. 12/2017 Echo: EF 60-65%, no rwma, Gr1 DD, mild AI/MR. Nl RVSP.   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   Myocardial infarction ([place]) 1997   stent x 1   Smoker    Thrombocytopenia ([place])     Past Surgical History:  Procedure Laterality Date   ANGIOPLASTY     BONE MARROW ASPIRATION  03/2009   CARDIAC CATHETERIZATION  1997   stent x 1   COLONOSCOPY WITH PROPOFOL N/A 08/09/2018   Procedure: COLONOSCOPY WITH PROPOFOL;  Surgeon: [name], MD;  Location: [facility] ENDOSCOPY;  Service: Gastroenterology;  Laterality: N/A;   CORONARY STENT PLACEMENT  08/1996   EXTERIORIZATION OF A CONTINUOUS AMBULATORY PERITONEAL DIALYSIS CATHETER     EYE SURGERY Bilateral    cats removed   REVERSE SHOULDER ARTHROPLASTY Left 11/19/2020   Procedure: Left reverse shoulder arthroplasty;  Surgeon: Leim Fabry, MD;  Location: [facility] ORS;  Service: Orthopedics;  Laterality: Left;    There were no vitals filed for this visit.   Subjective Assessment - 03/26/21 1423     Subjective  "I used my putty a couple of days."    Pertinent History L reverse TSA on 11/19/2020    Patient Stated Goals "I want to use my arm better."    Currently in Pain? Yes    Pain Score 5     Pain Location Arm    Pain Orientation Left    Pain Descriptors / Indicators Aching;Sore;Tingling    Pain Type Chronic pain    Pain Radiating Towards upper arm    Pain Onset More than a month ago    Pain Frequency Intermittent    Pain Relieving Factors rest    Multiple Pain Sites No  Occupational Therapy Treatment: Neuro re-ed: Pt participated in grip strengthening using L hand gripper at 6.6 lbs of pressure to remove 1" pegs from board and place into bucket.  Initially dropped about 50%, but improved to 75% accuracy when cued to sustain grip long enough to drop peg into bucket.  Facilitated pinch strengthening using 3 point and lateral pinch to pinch clothespins on/off horizontal dowel.  Frequent rest breaks needed between gripping and pinching exercises d/t muscle fatigue.  Practiced small object manipulation with picking up magnetic washers 1 at a time, keeping in palm of hand, and discarding onto table top 1 at a time with 75% accuracy.  Able to pick up washers and place over vertical dowels using LUE with good accuracy.  Instructed in digit isolation exercises for digit extension on table top, requiring tactile cues.  Practiced object manipulation with 1" peg on peg  board, rotating 180 degrees to place back in peg board, keeping wrist on peg board to facilitate motor planning of digits vs compensating with forearm rotation.  Yellow hand gripper x1 set 10 with ease, blue hand gripper x1 set 10 with difficulty.    Response to Treatment: Pt continues to report shaking in L hand during functional activity.  OT encouraged frequent rest breaks during session, and encouraged to carry over rest breaks at home when L hand becomes shaky to allow muscles to rest and prevent increased frustration with a task.  OT encouraged pt increase putty use to daily for increasing grip strength.  Pt agreed.  Pt will benefit from continued OT in order to maximize strength and coordination in LUE and increased functional use of LUE for self care and leisure.     OT Education - 03/26/21 1425     Education Details HEP with encouragement to use putty daily    Person(s) Educated Patient    Methods Explanation;Verbal cues    Comprehension Verbalized understanding;Returned demonstration              OT Short Term Goals - 03/21/21 1727       OT SHORT TERM GOAL #1   Title Pt will be indep with HEP for improving strength and coordination in LUE for improved ADL performance.    Baseline Eval: issued theraputty and instructed in grasping and pinching exercises.  Will continue to progress HEP with additional visits.    Time 6    Period Weeks    Status New    Target Date 05/01/21               OT Long Term Goals - 03/21/21 1728       OT LONG TERM GOAL #1   Title Pt will be able to clip his fingernails independently.    Baseline Eval: Difficulty clipping R hand fingernails d/t decreased coordination to L hand.    Time 12    Period Weeks    Status New    Target Date 06/12/21      OT LONG TERM GOAL #2   Title Pt will be able to manage his dialysis supplies and set up efficiently and independently.    Baseline Eval: extra time required d/t decreased coordination in L  hand.    Time 12    Period Weeks    Status New    Target Date 06/12/21      OT LONG TERM GOAL #3   Title Pt will be able to confidently hold his coffee cup with L hand without fear of dropping  cup.    Baseline Eval: unable to hold coffee cup in L hand.    Time 12    Period Weeks    Status New    Target Date 06/12/21      OT LONG TERM GOAL #4   Title Pt will improve FOTO score to 59 or better to indicate increased functional performance .    Baseline Eval: 57    Time 12    Period Weeks    Status New    Target Date 06/12/21                   Plan - 03/26/21 1442     Clinical Impression Statement Pt continues to report shaking in L hand during functional activity.  OT encouraged frequent rest breaks during session, and encouraged to carry over rest breaks at home when L hand becomes shaky to allow muscles to rest and prevent increased frustration with a task.  OT encouraged pt increase putty use to daily for increasing grip strength.  Pt agreed.  Pt will benefit from continued OT in order to maximize strength and coordination in LUE and increased functional use of LUE for self care and leisure.    OT Occupational Profile and History Detailed Assessment- Review of Records and additional review of physical, cognitive, psychosocial history related to current functional performance    Occupational performance deficits (Please refer to evaluation for details): ADL's;IADL's;Leisure    Body Structure / Function / Physical Skills ADL;Coordination;GMC;UE functional use;Sensation;Body mechanics;Flexibility;IADL;Pain;Dexterity;FMC;Strength;ROM    Rehab Potential Good    Clinical Decision Making Several treatment options, min-mod task modification necessary    Comorbidities Affecting Occupational Performance: May have comorbidities impacting occupational performance    Modification or Assistance to Complete Evaluation  No modification of tasks or assist necessary to complete eval    OT  Frequency 1x / week    OT Duration 12 weeks    OT Treatment/Interventions Self-care/ADL training;Therapeutic exercise;DME and/or AE instruction;Neuromuscular education;Manual Therapy;Passive range of motion;Therapeutic activities;Patient/family education    Consulted and Agree with Plan of Care Patient             Patient will benefit from skilled therapeutic intervention in order to improve the following deficits and impairments:   Body Structure / Function / Physical Skills: ADL, Coordination, GMC, UE functional use, Sensation, Body mechanics, Flexibility, IADL, Pain, Dexterity, FMC, Strength, ROM       Visit Diagnosis: Other lack of coordination  Muscle weakness (generalized)    Problem List Patient Active Problem List   Diagnosis Date Noted   History of CVA (cerebrovascular accident) 03/14/2021   Aortic atherosclerosis ([place]) 12/12/2020   Anemia due to vitamin B12 deficiency    Acute CVA (cerebrovascular accident) ([place])    Anemia of chronic disease    Urinary retention    Focal seizure ([place]) 11/20/2020   Thrombocytopenia ([place]) 11/20/2020   ESRD (end stage renal disease) ([place]) 11/20/2020   Hyperkalemia 11/20/2020   Leukocytosis 11/20/2020   ESRD on peritoneal dialysis ([place]) 11/20/2020   Status post shoulder replacement    Rotator cuff arthropathy 11/19/2020   Tobacco abuse 11/07/2020   Hypertensive heart and chronic kidney disease with heart failure and with stage 5 chronic kidney disease, or end stage renal disease ([place]) 07/10/2020   Dialysis patient ([place]) 01/09/2020   Senile purpura ([place]) 01/09/2020   Centrilobular emphysema ([place]) 09/13/2019   Secondary hyperparathyroidism ([place]) 03/14/2019   BPH (benign prostatic hyperplasia) 10/24/2018   Depression 10/24/2018   FSGS (  focal segmental glomerulosclerosis) 01/22/2018   Anemia of chronic kidney failure, stage 5 (HCC) 12/30/2017   Proteinuria 11/25/2017   Gout 08/27/2017   Primary myelofibrosis (HCC)    Major  depression, recurrent (HCC)    Benign hypertensive renal disease    CKD (chronic kidney disease) stage 5, GFR less than 15 ml/min (HCC)    Hyperlipidemia LDL goal <70 10/23/2002   CAD (coronary artery disease) 08/22/1996   Leta Speller, MS, OTR/L  Darleene Cleaver 03/26/2021, 2:42 PM  [place] MAIN [facility] SERVICES [address] [place], [place], [address] Phone: [phone]   Fax:  [phone]  Name: James Arnold MRN: [id number] Date of Birth: November 17, 1955

## 2021-03-26 NOTE — Progress Notes (Signed)
Patient has been experiencing episodes of dizziness at least daily. Wife does not know if this is related to his hypoglycemia. Sometimes the patient does not eat prior to dialysis despite pt's wife encouraging him to eat. BPs have been stable at home.

## 2021-03-27 ENCOUNTER — Inpatient Hospital Stay: Payer: Medicare HMO | Attending: Internal Medicine

## 2021-03-27 ENCOUNTER — Inpatient Hospital Stay: Payer: Medicare HMO

## 2021-03-27 ENCOUNTER — Inpatient Hospital Stay (HOSPITAL_BASED_OUTPATIENT_CLINIC_OR_DEPARTMENT_OTHER): Payer: Medicare HMO | Admitting: Internal Medicine

## 2021-03-27 VITALS — BP 140/73 | HR 77 | Temp 98.3°F | Wt 149.8 lb

## 2021-03-27 DIAGNOSIS — D649 Anemia, unspecified: Secondary | ICD-10-CM | POA: Insufficient documentation

## 2021-03-27 DIAGNOSIS — J449 Chronic obstructive pulmonary disease, unspecified: Secondary | ICD-10-CM | POA: Diagnosis not present

## 2021-03-27 DIAGNOSIS — N186 End stage renal disease: Secondary | ICD-10-CM | POA: Insufficient documentation

## 2021-03-27 DIAGNOSIS — F17210 Nicotine dependence, cigarettes, uncomplicated: Secondary | ICD-10-CM | POA: Diagnosis not present

## 2021-03-27 DIAGNOSIS — Z8673 Personal history of transient ischemic attack (TIA), and cerebral infarction without residual deficits: Secondary | ICD-10-CM | POA: Diagnosis not present

## 2021-03-27 DIAGNOSIS — I951 Orthostatic hypotension: Secondary | ICD-10-CM | POA: Insufficient documentation

## 2021-03-27 DIAGNOSIS — D471 Chronic myeloproliferative disease: Secondary | ICD-10-CM | POA: Insufficient documentation

## 2021-03-27 DIAGNOSIS — Z992 Dependence on renal dialysis: Secondary | ICD-10-CM | POA: Diagnosis not present

## 2021-03-27 LAB — COMPREHENSIVE METABOLIC PANEL
ALT: 12 U/L (ref 0–44)
AST: 9 U/L — ABNORMAL LOW (ref 15–41)
Albumin: 3 g/dL — ABNORMAL LOW (ref 3.5–5.0)
Alkaline Phosphatase: 46 U/L (ref 38–126)
Anion gap: 9 (ref 5–15)
BUN: 54 mg/dL — ABNORMAL HIGH (ref 8–23)
CO2: 26 mmol/L (ref 22–32)
Calcium: 7.6 mg/dL — ABNORMAL LOW (ref 8.9–10.3)
Chloride: 103 mmol/L (ref 98–111)
Creatinine, Ser: 6.73 mg/dL — ABNORMAL HIGH (ref 0.61–1.24)
GFR, Estimated: 9 mL/min — ABNORMAL LOW (ref >60)
Glucose, Bld: 91 mg/dL (ref 70–99)
Potassium: 4.7 mmol/L (ref 3.5–5.1)
Sodium: 138 mmol/L (ref 135–145)
Total Bilirubin: 0.6 mg/dL (ref 0.3–1.2)
Total Protein: 5.7 g/dL — ABNORMAL LOW (ref 6.5–8.1)

## 2021-03-27 LAB — CBC WITH DIFFERENTIAL/PLATELET
Abs Immature Granulocytes: 0.03 10*3/uL (ref 0.00–0.07)
Basophils Absolute: 0 10*3/uL (ref 0.0–0.1)
Basophils Relative: 0 %
Eosinophils Absolute: 0 10*3/uL (ref 0.0–0.5)
Eosinophils Relative: 1 %
HCT: 27.5 % — ABNORMAL LOW (ref 39.0–52.0)
Hemoglobin: 8.9 g/dL — ABNORMAL LOW (ref 13.0–17.0)
Immature Granulocytes: 1 %
Lymphocytes Relative: 8 %
Lymphs Abs: 0.3 10*3/uL — ABNORMAL LOW (ref 0.7–4.0)
MCH: 30.5 pg (ref 26.0–34.0)
MCHC: 32.4 g/dL (ref 30.0–36.0)
MCV: 94.2 fL (ref 80.0–100.0)
Monocytes Absolute: 0.2 10*3/uL (ref 0.1–1.0)
Monocytes Relative: 3 %
Neutro Abs: 3.8 10*3/uL (ref 1.7–7.7)
Neutrophils Relative %: 87 %
Platelets: 101 10*3/uL — ABNORMAL LOW (ref 150–400)
RBC: 2.92 MIL/uL — ABNORMAL LOW (ref 4.22–5.81)
RDW: 16.9 % — ABNORMAL HIGH (ref 11.5–15.5)
WBC: 4.4 10*3/uL (ref 4.0–10.5)
nRBC: 0 % (ref 0.0–0.2)

## 2021-03-27 LAB — SAMPLE TO BLOOD BANK

## 2021-03-27 LAB — LACTATE DEHYDROGENASE: LDH: 408 U/L — ABNORMAL HIGH (ref 98–192)

## 2021-03-27 NOTE — Assessment & Plan Note (Addendum)
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  #Patient discontinued Jakafi-because of poor tolerance.  #Orthostatic hypotension 130 supine standing 107- STOP lasix; recommend stocking [recommend compliance] recommend follow-up with Dr. Latif/nephrology regarding adjusting fluid balance during dialysis  #Anemia-today hemoglobin is 8-9. Defer to nephroloEPO stimulating agent /IV iron as per nephrology  #Stroke/left upper extremity weakness/seizures- [s/p shoulder surgery] stable on antiplatelet therapy.  #COPD-poorly controlled; declines PFTs; continue prednisone 10 mg a day.  #End-stage renal disease [Dr. Latif]  on PD; STABLE   # COVID vaccine: DECLINED Covid-19 vaccine; recommend restart acyclovir.  # DISPOSITION:  # HOLD PRBC transfusion # follow up in 1 months-labs- MD-CBC/CMP;LDH;HOLD tube; posssible1 unit PRBC tranfusion--Dr.B  Cc; Dr.[name] /Dr.Johnson/Dr.Shah

## 2021-03-27 NOTE — Progress Notes (Signed)
[place] OFFICE PROGRESS NOTE  Patient Care Team: Valerie Roys, DO as PCP - General (Family Medicine) End, Harrell Gave, MD as PCP - Cardiology (Cardiology) Cammie Sickle, MD as Consulting Physician (Internal Medicine)  Cancer Staging No matching staging information was found for the patient.   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   # AUGUST 1st week 2019- Retacrit   # October 2nd 2019- jakafi 10 BID [? Renal involvement]; September 2020-peritoneal dialysis; Jakafi 10 mg in the morning; STOPPED/tapered [name], 2021 [sec intolerance fatigue/myalgias]  # May 4th 2022- START [name] dosing/PD]  # CKD 1.5; poorly controlled HTN; AUG 2018- worsening of renal function Creat ~2.1; AUG 2018- Bil Kidney US- NEG for hydronephrosis. [name]- kidney Bx- FSG [ on Prednisone Dr.[name]]; July 2020-peritoneal dialysis;   # hx of Lung nodules- resolved [[name]]/quit smoking.  DIAGNOSIS: PRIMARY MYELOFIBROSIS  RISK:LOW     ;GOALS: Control  CURRENT/MOST RECENT THERAPY : Surveillance    Primary myelofibrosis ([place])      INTERVAL HISTORY:  James Arnold 65 y.o.  male pleasant patient above history of primary myelofibrosis Jak 2+ c most recently on Jakafi [restarted since Jan 23, 2021] chronic kidney disease on peritoneal dialysis is here for follow-up.  However approximately a month ago patient stopped his Jakafi because of poor tolerance.  Complains of extreme dizziness extreme fatigue.  Has been off Jakafi for approximately 1 month-he continues to have dizzy spells especially with standing.  No nausea no vomiting.  Continues to feel poorly.  Review of Systems  Constitutional:  Positive for malaise/fatigue and weight loss. Negative for chills,  diaphoresis and fever.  HENT:  Negative for nosebleeds and sore throat.   Eyes:  Negative for double vision.  Respiratory:  Positive for cough and shortness of breath. Negative for hemoptysis, sputum production and wheezing.   Cardiovascular:  Negative for chest pain, palpitations and orthopnea.  Gastrointestinal:  Negative for abdominal pain, blood in stool, constipation, diarrhea, heartburn, melena, nausea and vomiting.  Genitourinary:  Negative for dysuria, frequency and urgency.  Musculoskeletal:  Positive for back pain and joint pain.  Skin: Negative.  Negative for itching and rash.  Neurological:  Positive for dizziness and tingling. Negative for focal weakness, weakness and headaches.  Endo/Heme/Allergies:  Does not bruise/bleed easily.  Psychiatric/Behavioral:  Negative for depression. The patient is not nervous/anxious and does not have insomnia.      PAST MEDICAL HISTORY :  Past Medical History:  Diagnosis Date   Anemia    Benign hypertensive renal disease    CAD (coronary artery disease)    a. 08/1996 s/p BMS to the mLAD ([facility]); b. 12/2017 MV: EF 46%, fixed apical defect w/ significant GI uptake artifact. No ischemia-> low risk.   CKD (chronic kidney disease), stage V ([place])    on peritoneal dialysis   Complication of anesthesia    please call by "[name]" when waking up!!   COPD (chronic obstructive pulmonary disease) (HCC)    centrilobular emphysema. pt is poorly controlled with his copd/smoking.   Depression    Diastolic dysfunction    a. 12/2017 Echo: EF 60-65%, no rwma, Gr1 DD, mild AI/MR. Nl RVSP.   Dyspnea    with exertion   Fatigue    FSGS (focal segmental glomerulosclerosis)    Hyperlipidemia 10/2002   Hypertension  myelofibrosis    bone marrow failure   Myelofibrosis ([place]) 2010   JAK2 (+) primary   Myocardial infarction ([place]) 1997   stent x 1   Smoker    Thrombocytopenia ([place])     PAST SURGICAL HISTORY :   Past Surgical History:  Procedure Laterality  Date   ANGIOPLASTY     BONE MARROW ASPIRATION  03/2009   CARDIAC CATHETERIZATION  1997   stent x 1   COLONOSCOPY WITH PROPOFOL N/A 08/09/2018   Procedure: COLONOSCOPY WITH PROPOFOL;  Surgeon: [name], MD;  Location: [facility] ENDOSCOPY;  Service: Gastroenterology;  Laterality: N/A;   CORONARY STENT PLACEMENT  08/1996   EXTERIORIZATION OF A CONTINUOUS AMBULATORY PERITONEAL DIALYSIS CATHETER     EYE SURGERY Bilateral    cats removed   REVERSE SHOULDER ARTHROPLASTY Left 11/19/2020   Procedure: Left reverse shoulder arthroplasty;  Surgeon: Leim Fabry, MD;  Location: [facility] ORS;  Service: Orthopedics;  Laterality: Left;    FAMILY HISTORY :   Family History  Problem Relation Age of Onset   Stroke Mother        Low BP stroke   Depression Father    Depression Sister        Breast   Heart disease Other    Breast cancer Other    Lung cancer Other    Ovarian cancer Other    Stomach cancer Other     SOCIAL HISTORY:   Social History   Tobacco Use   Smoking status: Every Day    Packs/day: 0.75    Years: 47.00    Pack years: 35.25    Types: Cigarettes   Smokeless tobacco: Never   Tobacco comments:    trying to cut back-smokes 6 to 7 cigs per day  Vaping Use   Vaping Use: Never used  Substance Use Topics   Alcohol use: Yes    Comment: "6 beers weekly on Saturdays"   Drug use: No    ALLERGIES:  has No Known Allergies.  MEDICATIONS:  Current Outpatient Medications  Medication Sig Dispense Refill   albuterol (VENTOLIN HFA) 108 (90 Base) MCG/ACT inhaler Inhale 2 puffs into the lungs every 6 (six) hours as needed for wheezing or shortness of breath. 8 g 2   aspirin 81 MG chewable tablet Chew 1 tablet (81 mg total) by mouth daily. 90 tablet 1   carvedilol (COREG) 12.5 MG tablet Take 1.5 tablets (18.75 mg total) by mouth 2 (two) times daily. 270 tablet 3   Cholecalciferol (VITAMIN D-1000 MAX ST) 25 MCG (1000 UT) tablet Take 1,000 Units by mouth daily.      citalopram (CELEXA) 40 MG  tablet Take 1 tablet (40 mg total) by mouth daily. 90 tablet 1   epoetin alfa-epbx (RETACRIT) 17408 UNIT/ML injection 10,000 Units every 30 (thirty) days. At dialysis     ferrous sulfate 325 (65 FE) MG tablet Take 1 tablet (325 mg total) by mouth daily with breakfast. 30 tablet 1   furosemide (LASIX) 20 MG tablet Take 1 tablet (20 mg total) by mouth daily. 90 tablet 1   gentamicin cream (GARAMYCIN) 0.1 % Apply 1 application topically every other day.     levETIRAcetam (KEPPRA) 500 MG tablet Take 1 tablet (500 mg total) by mouth at bedtime. 30 tablet 0   omeprazole (PRILOSEC) 20 MG capsule Take 1 capsule (20 mg total) by mouth daily. 90 capsule 1   ondansetron (ZOFRAN ODT) 8 MG disintegrating tablet Take 1 tablet (8 mg total) by mouth every  8 (eight) hours as needed for nausea or vomiting. 30 tablet 1   polyethylene glycol (MIRALAX / GLYCOLAX) 17 g packet Take 17 g by mouth daily as needed. 14 each 0   predniSONE (DELTASONE) 10 MG tablet Take 10 mg by mouth daily with breakfast.     rosuvastatin (CRESTOR) 5 MG tablet Take 1 tablet (5 mg total) by mouth every Monday, Wednesday, and Friday. 36 tablet 3   ruxolitinib phosphate (JAKAFI) 15 MG tablet Take 1 pill Monday Wednesday Friday after dialysis. (Patient not taking: Reported on 03/26/2021) 12 tablet 4   No current facility-administered medications for this visit.    PHYSICAL EXAMINATION: ECOG PERFORMANCE STATUS: 1 - Symptomatic but completely ambulatory  BP 140/73   Pulse 77   Temp 98.3 F (36.8 C)   Wt 149 lb 12.8 oz (67.9 kg)   SpO2 100%   BMI 23.46 kg/m   Filed Weights   03/27/21 0924  Weight: 149 lb 12.8 oz (67.9 kg)    Physical Exam Constitutional:      Comments: Ambulating independently.  He is alone. Appears cachectic.  HENT:     Head: Normocephalic and atraumatic.     Mouth/Throat:     Pharynx: No oropharyngeal exudate.  Eyes:     Pupils: Pupils are equal, round, and reactive to light.  Cardiovascular:     Rate and  Rhythm: Normal rate and regular rhythm.  Pulmonary:     Effort: No respiratory distress.     Breath sounds: No wheezing.     Comments: Decreased air entry bilaterally. Abdominal:     General: Bowel sounds are normal. There is no distension.     Palpations: Abdomen is soft. There is no mass.     Tenderness: There is no abdominal tenderness. There is no guarding or rebound.  Musculoskeletal:        General: No tenderness. Normal range of motion.     Cervical back: Normal range of motion and neck supple.  Skin:    General: Skin is warm.  Neurological:     Mental Status: He is alert and oriented to person, place, and time.     Comments: Left upper extremity weakness noted.    Psychiatric:        Mood and Affect: Affect normal.    LABORATORY DATA:  I have reviewed the data as listed    Component Value Date/Time   NA 138 03/27/2021 0905   NA 138 01/08/2021 0915   K 4.7 03/27/2021 0905   CL 103 03/27/2021 0905   CO2 26 03/27/2021 0905   GLUCOSE 91 03/27/2021 0905   BUN 54 (H) 03/27/2021 0905   BUN 63 (H) 01/08/2021 0915   CREATININE 6.73 (H) 03/27/2021 0905   CREATININE 1.12 09/30/2013 0953   CALCIUM 7.6 (L) 03/27/2021 0905   PROT 5.7 (L) 03/27/2021 0905   PROT 5.5 (L) 01/08/2021 0915   ALBUMIN 3.0 (L) 03/27/2021 0905   ALBUMIN 3.7 (L) 01/08/2021 0915   AST 9 (L) 03/27/2021 0905   ALT 12 03/27/2021 0905   ALKPHOS 46 03/27/2021 0905   BILITOT 0.6 03/27/2021 0905   BILITOT 0.3 01/08/2021 0915   GFRNONAA 9 (L) 03/27/2021 0905   GFRNONAA >60 09/30/2013 0953   GFRAA 14 (L) 07/10/2020 0909   GFRAA >60 09/30/2013 0953    No results found for: SPEP, UPEP  Lab Results  Component Value Date   WBC 4.4 03/27/2021   NEUTROABS 3.8 03/27/2021   HGB 8.9 (L) 03/27/2021  HCT 27.5 (L) 03/27/2021   MCV 94.2 03/27/2021   PLT 101 (L) 03/27/2021      Chemistry      Component Value Date/Time   NA 138 03/27/2021 0905   NA 138 01/08/2021 0915   K 4.7 03/27/2021 0905   CL 103  03/27/2021 0905   CO2 26 03/27/2021 0905   BUN 54 (H) 03/27/2021 0905   BUN 63 (H) 01/08/2021 0915   CREATININE 6.73 (H) 03/27/2021 0905   CREATININE 1.12 09/30/2013 0953      Component Value Date/Time   CALCIUM 7.6 (L) 03/27/2021 0905   ALKPHOS 46 03/27/2021 0905   AST 9 (L) 03/27/2021 0905   ALT 12 03/27/2021 0905   BILITOT 0.6 03/27/2021 0905   BILITOT 0.3 01/08/2021 0915       RADIOGRAPHIC STUDIES: I have personally reviewed the radiological images as listed and agreed with the findings in the report. No results found.   ASSESSMENT & PLAN:  Primary myelofibrosis ([place]) # Primary Myelofiborisis [Bone marrow 2010] jak-2 POSITIVE;  February 2022-worsening splenomegaly/worsening fatigue.  #Patient discontinued Jakafi-because of poor tolerance.  #Orthostatic hypotension 130 supine standing 107-  HR- STOP lasix; recommend stocking [recommend compliance] recommend follow-up with Dr. Latif/nephrology regarding adjusting fluid balance during dialysis  #Anemia-today hemoglobin is 8-9. Defer to nephroloEPO stimulating agent /IV iron as per nephrology  #Stroke/left upper extremity weakness/seizures- [s/p shoulder surgery] stable on antiplatelet therapy.   #COPD-poorly controlled; declines PFTs; continue prednisone 10 mg a day.  #End-stage renal disease [Dr. Latif]  on PD; STABLE   # COVID vaccine: DECLINED Covid-19 vaccine; recommend restart acyclovir.  # DISPOSITION:  # HOLD PRBC transfusion # follow up in 1 months-labs- MD-CBC/CMP;LDH;HOLD tube; posssible1 unit PRBC tranfusion--Dr.B  Cc; Dr.[name] /Dr.Johnson/Dr.Shah  Orders Placed This Encounter  Procedures   Comprehensive metabolic panel    Standing Status:   Future    Standing Expiration Date:   03/27/2022   CBC with Differential    Standing Status:   Future    Standing Expiration Date:   03/27/2022   Lactate dehydrogenase    Standing Status:   Future    Standing Expiration Date:   03/27/2022   Hold Tube- Blood Bank     Standing Status:   Future    Standing Expiration Date:   03/27/2022   All questions were answered. The patient knows to call the clinic with any problems, questions or concerns.     Cammie Sickle, MD 04/02/2021 10:38 PM

## 2021-03-27 NOTE — Patient Instructions (Signed)
#   STOP lasix [because of lower blood pressures on standing].   # Recommend compression stocking during day time and legs elevated at night.

## 2021-03-28 DIAGNOSIS — Z992 Dependence on renal dialysis: Secondary | ICD-10-CM | POA: Diagnosis not present

## 2021-03-28 DIAGNOSIS — N186 End stage renal disease: Secondary | ICD-10-CM | POA: Diagnosis not present

## 2021-03-29 ENCOUNTER — Other Ambulatory Visit (HOSPITAL_COMMUNITY): Payer: Self-pay

## 2021-03-29 DIAGNOSIS — N186 End stage renal disease: Secondary | ICD-10-CM | POA: Diagnosis not present

## 2021-03-29 DIAGNOSIS — Z992 Dependence on renal dialysis: Secondary | ICD-10-CM | POA: Diagnosis not present

## 2021-03-31 DIAGNOSIS — N186 End stage renal disease: Secondary | ICD-10-CM | POA: Diagnosis not present

## 2021-03-31 DIAGNOSIS — Z992 Dependence on renal dialysis: Secondary | ICD-10-CM | POA: Diagnosis not present

## 2021-04-01 DIAGNOSIS — Z125 Encounter for screening for malignant neoplasm of prostate: Secondary | ICD-10-CM | POA: Diagnosis not present

## 2021-04-01 DIAGNOSIS — I259 Chronic ischemic heart disease, unspecified: Secondary | ICD-10-CM | POA: Diagnosis not present

## 2021-04-01 DIAGNOSIS — Z992 Dependence on renal dialysis: Secondary | ICD-10-CM | POA: Diagnosis not present

## 2021-04-01 DIAGNOSIS — N186 End stage renal disease: Secondary | ICD-10-CM | POA: Diagnosis not present

## 2021-04-02 ENCOUNTER — Encounter: Payer: Self-pay | Admitting: Internal Medicine

## 2021-04-02 ENCOUNTER — Other Ambulatory Visit: Payer: Self-pay

## 2021-04-02 ENCOUNTER — Ambulatory Visit: Payer: Medicare HMO

## 2021-04-02 DIAGNOSIS — Z992 Dependence on renal dialysis: Secondary | ICD-10-CM | POA: Diagnosis not present

## 2021-04-02 DIAGNOSIS — R278 Other lack of coordination: Secondary | ICD-10-CM | POA: Diagnosis not present

## 2021-04-02 DIAGNOSIS — M6281 Muscle weakness (generalized): Secondary | ICD-10-CM | POA: Diagnosis not present

## 2021-04-02 DIAGNOSIS — N186 End stage renal disease: Secondary | ICD-10-CM | POA: Diagnosis not present

## 2021-04-02 NOTE — Therapy (Signed)
[place] MAIN [facility] SERVICES [address] [place], [place], [address] Phone: [phone]   Fax:  [phone]  Occupational Therapy Treatment  Patient Details  Name: James Arnold MRN: [id number] Date of Birth: 08/30/1956 Referring Provider (OT): Dr. Manuella Ghazi   Encounter Date: 04/02/2021   OT End of Session - 04/02/21 1418     Visit Number 3    Number of Visits 12    Date for OT Re-Evaluation 06/12/21    Authorization Type Reporting period beginning 03/21/2021    OT Start Time 1147    OT Stop Time 1230    OT Time Calculation (min) 43 min    Activity Tolerance Patient tolerated treatment well    Behavior During Therapy [facility] for tasks assessed/performed             Past Medical History:  Diagnosis Date   Anemia    Benign hypertensive renal disease    CAD (coronary artery disease)    a. 08/1996 s/p BMS to the mLAD ([facility]); b. 12/2017 MV: EF 46%, fixed apical defect w/ significant GI uptake artifact. No ischemia-> low risk.   CKD (chronic kidney disease), stage V ([place])    on peritoneal dialysis   Complication of anesthesia    please call by "[name]" when waking up!!   COPD (chronic obstructive pulmonary disease) (HCC)    centrilobular emphysema. pt is poorly controlled with his copd/smoking.   Depression    Diastolic dysfunction    a. 12/2017 Echo: EF 60-65%, no rwma, Gr1 DD, mild AI/MR. Nl RVSP.   Dyspnea    with exertion   Fatigue    FSGS (focal segmental glomerulosclerosis)    Hyperlipidemia 10/2002   Hypertension    myelofibrosis    bone marrow failure   Myelofibrosis ([place]) 2010   JAK2 (+) primary   Myocardial infarction ([place]) 1997   stent x 1   Smoker    Thrombocytopenia ([place])     Past Surgical History:  Procedure Laterality Date   ANGIOPLASTY     BONE MARROW ASPIRATION  03/2009   CARDIAC CATHETERIZATION  1997   stent x 1   COLONOSCOPY WITH PROPOFOL N/A 08/09/2018   Procedure: COLONOSCOPY WITH PROPOFOL;  Surgeon: [name], MD;  Location: [facility] ENDOSCOPY;  Service: Gastroenterology;  Laterality: N/A;   CORONARY STENT PLACEMENT  08/1996   EXTERIORIZATION OF A CONTINUOUS AMBULATORY PERITONEAL DIALYSIS CATHETER     EYE SURGERY Bilateral    cats removed   REVERSE SHOULDER ARTHROPLASTY Left 11/19/2020   Procedure: Left reverse shoulder arthroplasty;  Surgeon: Leim Fabry, MD;  Location: [facility] ORS;  Service: Orthopedics;  Laterality: Left;    There were no vitals filed for this visit.   Subjective Assessment - 04/02/21 1416     Subjective  "I can feel my hand is getting stronger, it just still shakes some."    Pertinent History L reverse TSA on 11/19/2020    Patient Stated Goals "I want to use my arm better."    Currently in Pain? Yes    Pain Score 4     Pain Location Shoulder    Pain Orientation Left    Pain Descriptors / Indicators Aching    Pain Type Chronic pain    Pain Radiating Towards upper arm    Pain Onset More than a month ago    Pain Frequency Intermittent    Pain Relieving Factors rest    Multiple Pain Sites No  Occupational Therapy Treatment: Neuro re-ed: Pt participated in coordination/strengthening activities for L hand, working towards improved functional use for ADLs.  Used hand gripper 1 set on 6.6 lbs, another set on 11.2 lbs to remove 1" pegs from peg board.  100% accuracy on 6.6 lbs, 75% accuracy on 11.2 lbs with occasional dropping of pegs.  Focus on small object manipulation with picking up small pegs with lateral and 3pt pinch to place into peg board.  Cues to slow pace for better accuracy.  Completed coin manipulation activities with L hand, picking up coins, placing in bank slot, discarding coins 1 at a time from palm of hand.  Trial 1 pt dropped 3/10 coins when attempting to discard from palm of hand 1 at a time, trial 2 no dropping, trial 3 dropped 2/10 coins.  Additional finger isolation exercises rotating 1" pegs 180 degrees within palm of hand and returning to  peg board with 75% accuracy, then gathering 5 pegs in 1 hand, and removing into peg holes 1 at a time without dropping with 75% accuracy.   Self Care:  Used toe nail clippers to clip around the boarder of a small piece of paper, then progressed to simulation of clipping nails traced on paper.  Extra time needed and some shakiness of hand, improved with rest breaks.  Able to progress to using nail clippers (smaller) to do same activity. Pt was able to keep both size clippers in hand without dropping, occasionally requiring multiple pinching attempts to clip paper hard enough.    Response to treatment: Pt feels L hand is getting stronger, but feels limited with activity d/t the shakiness of his hand.  Pt does not yet feel confident to hold onto a mug or clip fingernails using L hand.  Pt reports consistent use of his theraputty at home.  OT encouraged continued use.  Pt reports he has to cancel next week d/t a long procedure at dialysis.  Pt is showing good progress towards goals.  Continued skilled OT will benefit pt to maximize strength and coordination in LUE in order to improve functional use of LUE for self care.     OT Education - 04/02/21 1418     Education Details HEP with encouragement to use putty daily    Person(s) Educated Patient    Methods Explanation;Verbal cues    Comprehension Verbalized understanding;Returned demonstration              OT Short Term Goals - 03/21/21 1727       OT SHORT TERM GOAL #1   Title Pt will be indep with HEP for improving strength and coordination in LUE for improved ADL performance.    Baseline Eval: issued theraputty and instructed in grasping and pinching exercises.  Will continue to progress HEP with additional visits.    Time 6    Period Weeks    Status New    Target Date 05/01/21               OT Long Term Goals - 03/21/21 1728       OT LONG TERM GOAL #1   Title Pt will be able to clip his fingernails independently.     Baseline Eval: Difficulty clipping R hand fingernails d/t decreased coordination to L hand.    Time 12    Period Weeks    Status New    Target Date 06/12/21      OT LONG TERM GOAL #2   Title Pt will be  able to manage his dialysis supplies and set up efficiently and independently.    Baseline Eval: extra time required d/t decreased coordination in L hand.    Time 12    Period Weeks    Status New    Target Date 06/12/21      OT LONG TERM GOAL #3   Title Pt will be able to confidently hold his coffee cup with L hand without fear of dropping cup.    Baseline Eval: unable to hold coffee cup in L hand.    Time 12    Period Weeks    Status New    Target Date 06/12/21      OT LONG TERM GOAL #4   Title Pt will improve FOTO score to 59 or better to indicate increased functional performance .    Baseline Eval: 57    Time 12    Period Weeks    Status New    Target Date 06/12/21                Plan - 04/02/21 1435     Clinical Impression Statement Pt feels L hand is getting stronger, but feels limited with activity d/t the shakiness of his hand.  Pt does not yet feel confident to hold onto a mug or clip fingernails using L hand.  Pt reports consistent use of his theraputty at home.  OT encouraged continued use.  Pt reports he has to cancel next week d/t a long procedure at dialysis.  Pt is showing good progress towards goals.  Continued skilled OT will benefit pt to maximize strength and coordination in LUE in order to improve functional use of LUE for self care.    OT Occupational Profile and History Detailed Assessment- Review of Records and additional review of physical, cognitive, psychosocial history related to current functional performance    Occupational performance deficits (Please refer to evaluation for details): ADL's;IADL's;Leisure    Body Structure / Function / Physical Skills ADL;Coordination;GMC;UE functional use;Sensation;Body  mechanics;Flexibility;IADL;Pain;Dexterity;FMC;Strength;ROM    Rehab Potential Good    Clinical Decision Making Several treatment options, min-mod task modification necessary    Comorbidities Affecting Occupational Performance: May have comorbidities impacting occupational performance    Modification or Assistance to Complete Evaluation  No modification of tasks or assist necessary to complete eval    OT Frequency 1x / week    OT Duration 12 weeks    OT Treatment/Interventions Self-care/ADL training;Therapeutic exercise;DME and/or AE instruction;Neuromuscular education;Manual Therapy;Passive range of motion;Therapeutic activities;Patient/family education    Consulted and Agree with Plan of Care Patient             Patient will benefit from skilled therapeutic intervention in order to improve the following deficits and impairments:   Body Structure / Function / Physical Skills: ADL, Coordination, GMC, UE functional use, Sensation, Body mechanics, Flexibility, IADL, Pain, Dexterity, FMC, Strength, ROM       Visit Diagnosis: Other lack of coordination  Muscle weakness (generalized)    Problem List Patient Active Problem List   Diagnosis Date Noted   History of CVA (cerebrovascular accident) 03/14/2021   Aortic atherosclerosis ([place]) 12/12/2020   Anemia due to vitamin B12 deficiency    Acute CVA (cerebrovascular accident) ([place])    Anemia of chronic disease    Urinary retention    Focal seizure ([place]) 11/20/2020   Thrombocytopenia ([place]) 11/20/2020   ESRD (end stage renal disease) ([place]) 11/20/2020   Hyperkalemia 11/20/2020   Leukocytosis 11/20/2020   ESRD on  peritoneal dialysis ([place]) 11/20/2020   Status post shoulder replacement    Rotator cuff arthropathy 11/19/2020   Tobacco abuse 11/07/2020   Hypertensive heart and chronic kidney disease with heart failure and with stage 5 chronic kidney disease, or end stage renal disease ([place]) 07/10/2020   Dialysis patient ([place]) 01/09/2020    Senile purpura ([place]) 01/09/2020   Centrilobular emphysema ([place]) 09/13/2019   Secondary hyperparathyroidism ([place]) 03/14/2019   BPH (benign prostatic hyperplasia) 10/24/2018   Depression 10/24/2018   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   Anemia of chronic kidney failure, stage 5 ([place]) 12/30/2017   Proteinuria 11/25/2017   Gout 08/27/2017   Primary myelofibrosis ([place])    Major depression, recurrent (HCC)    Benign hypertensive renal disease    CKD (chronic kidney disease) stage 5, GFR less than 15 ml/min (HCC)    Hyperlipidemia LDL goal <70 10/23/2002   CAD (coronary artery disease) 08/22/1996   Leta Speller, MS, OTR/L Darleene Cleaver 04/02/2021, 2:37 PM  [place] MAIN [facility] SERVICES [address] [place], [place], [address] Phone: [phone]   Fax:  [phone]  Name: CHAPIN ARDUINI MRN: [id number] Date of Birth: 09/14/1956

## 2021-04-03 DIAGNOSIS — N186 End stage renal disease: Secondary | ICD-10-CM | POA: Diagnosis not present

## 2021-04-03 DIAGNOSIS — Z992 Dependence on renal dialysis: Secondary | ICD-10-CM | POA: Diagnosis not present

## 2021-04-04 DIAGNOSIS — N186 End stage renal disease: Secondary | ICD-10-CM | POA: Diagnosis not present

## 2021-04-04 DIAGNOSIS — Z992 Dependence on renal dialysis: Secondary | ICD-10-CM | POA: Diagnosis not present

## 2021-04-05 DIAGNOSIS — Z992 Dependence on renal dialysis: Secondary | ICD-10-CM | POA: Diagnosis not present

## 2021-04-05 DIAGNOSIS — N186 End stage renal disease: Secondary | ICD-10-CM | POA: Diagnosis not present

## 2021-04-07 DIAGNOSIS — N186 End stage renal disease: Secondary | ICD-10-CM | POA: Diagnosis not present

## 2021-04-07 DIAGNOSIS — Z992 Dependence on renal dialysis: Secondary | ICD-10-CM | POA: Diagnosis not present

## 2021-04-08 DIAGNOSIS — Z992 Dependence on renal dialysis: Secondary | ICD-10-CM | POA: Diagnosis not present

## 2021-04-08 DIAGNOSIS — N186 End stage renal disease: Secondary | ICD-10-CM | POA: Diagnosis not present

## 2021-04-09 DIAGNOSIS — N186 End stage renal disease: Secondary | ICD-10-CM | POA: Diagnosis not present

## 2021-04-09 DIAGNOSIS — Z992 Dependence on renal dialysis: Secondary | ICD-10-CM | POA: Diagnosis not present

## 2021-04-10 DIAGNOSIS — N186 End stage renal disease: Secondary | ICD-10-CM | POA: Diagnosis not present

## 2021-04-10 DIAGNOSIS — Z992 Dependence on renal dialysis: Secondary | ICD-10-CM | POA: Diagnosis not present

## 2021-04-11 DIAGNOSIS — N186 End stage renal disease: Secondary | ICD-10-CM | POA: Diagnosis not present

## 2021-04-11 DIAGNOSIS — Z992 Dependence on renal dialysis: Secondary | ICD-10-CM | POA: Diagnosis not present

## 2021-04-12 DIAGNOSIS — N186 End stage renal disease: Secondary | ICD-10-CM | POA: Diagnosis not present

## 2021-04-12 DIAGNOSIS — Z992 Dependence on renal dialysis: Secondary | ICD-10-CM | POA: Diagnosis not present

## 2021-04-14 DIAGNOSIS — N186 End stage renal disease: Secondary | ICD-10-CM | POA: Diagnosis not present

## 2021-04-14 DIAGNOSIS — Z992 Dependence on renal dialysis: Secondary | ICD-10-CM | POA: Diagnosis not present

## 2021-04-15 DIAGNOSIS — Z992 Dependence on renal dialysis: Secondary | ICD-10-CM | POA: Diagnosis not present

## 2021-04-15 DIAGNOSIS — N186 End stage renal disease: Secondary | ICD-10-CM | POA: Diagnosis not present

## 2021-04-16 DIAGNOSIS — N186 End stage renal disease: Secondary | ICD-10-CM | POA: Diagnosis not present

## 2021-04-16 DIAGNOSIS — Z992 Dependence on renal dialysis: Secondary | ICD-10-CM | POA: Diagnosis not present

## 2021-04-17 DIAGNOSIS — Z992 Dependence on renal dialysis: Secondary | ICD-10-CM | POA: Diagnosis not present

## 2021-04-17 DIAGNOSIS — N186 End stage renal disease: Secondary | ICD-10-CM | POA: Diagnosis not present

## 2021-04-18 DIAGNOSIS — Z992 Dependence on renal dialysis: Secondary | ICD-10-CM | POA: Diagnosis not present

## 2021-04-18 DIAGNOSIS — N186 End stage renal disease: Secondary | ICD-10-CM | POA: Diagnosis not present

## 2021-04-19 DIAGNOSIS — N186 End stage renal disease: Secondary | ICD-10-CM | POA: Diagnosis not present

## 2021-04-19 DIAGNOSIS — Z992 Dependence on renal dialysis: Secondary | ICD-10-CM | POA: Diagnosis not present

## 2021-04-21 DIAGNOSIS — N186 End stage renal disease: Secondary | ICD-10-CM | POA: Diagnosis not present

## 2021-04-21 DIAGNOSIS — Z992 Dependence on renal dialysis: Secondary | ICD-10-CM | POA: Diagnosis not present

## 2021-04-22 ENCOUNTER — Ambulatory Visit: Payer: Medicare HMO | Attending: Neurology

## 2021-04-22 ENCOUNTER — Other Ambulatory Visit: Payer: Self-pay

## 2021-04-22 DIAGNOSIS — M6281 Muscle weakness (generalized): Secondary | ICD-10-CM | POA: Diagnosis not present

## 2021-04-22 DIAGNOSIS — Z992 Dependence on renal dialysis: Secondary | ICD-10-CM | POA: Diagnosis not present

## 2021-04-22 DIAGNOSIS — R278 Other lack of coordination: Secondary | ICD-10-CM

## 2021-04-22 DIAGNOSIS — N186 End stage renal disease: Secondary | ICD-10-CM | POA: Diagnosis not present

## 2021-04-22 NOTE — Therapy (Signed)
[place] MAIN [facility] SERVICES [address] [place], [place], [address] Phone: [phone]   Fax:  [phone]  Occupational Therapy Treatment  Patient Details  Name: James Arnold MRN: [id number] Date of Birth: 1956-04-09 Referring Provider (OT): Dr. Manuella Ghazi   Encounter Date: 04/22/2021   OT End of Session - 04/22/21 1125     Visit Number 4    Number of Visits 12    Date for OT Re-Evaluation 06/12/21    Authorization Type Reporting period beginning 03/21/2021    OT Start Time 1057    OT Stop Time 1130    OT Time Calculation (min) 33 min    Activity Tolerance Patient tolerated treatment well    Behavior During Therapy [facility] for tasks assessed/performed             Past Medical History:  Diagnosis Date   Anemia    Benign hypertensive renal disease    CAD (coronary artery disease)    a. 08/1996 s/p BMS to the mLAD ([facility]); b. 12/2017 MV: EF 46%, fixed apical defect w/ significant GI uptake artifact. No ischemia-> low risk.   CKD (chronic kidney disease), stage V ([place])    on peritoneal dialysis   Complication of anesthesia    please call by "[name]" when waking up!!   COPD (chronic obstructive pulmonary disease) (HCC)    centrilobular emphysema. pt is poorly controlled with his copd/smoking.   Depression    Diastolic dysfunction    a. 12/2017 Echo: EF 60-65%, no rwma, Gr1 DD, mild AI/MR. Nl RVSP.   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   Myocardial infarction ([place]) 1997   stent x 1   Smoker    Thrombocytopenia ([place])     Past Surgical History:  Procedure Laterality Date   ANGIOPLASTY     BONE MARROW ASPIRATION  03/2009   CARDIAC CATHETERIZATION  1997   stent x 1   COLONOSCOPY WITH PROPOFOL N/A 08/09/2018   Procedure: COLONOSCOPY WITH PROPOFOL;  Surgeon: [name], MD;  Location: [facility] ENDOSCOPY;  Service: Gastroenterology;  Laterality: N/A;   CORONARY STENT PLACEMENT  08/1996   EXTERIORIZATION OF A CONTINUOUS AMBULATORY PERITONEAL DIALYSIS CATHETER     EYE SURGERY Bilateral    cats removed   REVERSE SHOULDER ARTHROPLASTY Left 11/19/2020   Procedure: Left reverse shoulder arthroplasty;  Surgeon: Leim Fabry, MD;  Location: [facility] ORS;  Service: Orthopedics;  Laterality: Left;    There were no vitals filed for this visit.   Subjective Assessment - 04/22/21 1117     Subjective  "I've been good.  My hand shakes when it's tired but it's getting better."    Pertinent History L reverse TSA on 11/19/2020    Patient Stated Goals "I want to use my arm better."    Currently in Pain? Yes    Pain Score 4     Pain Location Shoulder    Pain Orientation Left    Pain Descriptors / Indicators Numbness    Pain Type Chronic pain    Pain Radiating Towards upper arm    Pain Onset More than a month ago    Pain Frequency Intermittent    Pain Relieving Factors rest    Multiple Pain Sites No  Occupational Therapy Treatment: Neuro re-ed: Pt participated in LUE neuro re-ed for increasing strength and coordination in L hand.  Used hand gripper at 11.2 lbs to place and remove jumbo pegs from pegboard.  Used large tweezers to pick up wide tooth picks and placed into small container, working on pinch and precision with a target.  Participated in coin manipulation activities, picking up coins from table, removing from hand, placing into slotted coin jar with L hand.   Response to Treatment: Pt reports L hand is continuing to improve with grip and pinch.  He verbalized that he was able to clip his nails the other day, but "it wasn't easy."  Pt states that his hand still fatigues as noted by shaking with prolonged gripping.  Pt tolerated treatment activities well with frequent rest breaks.  Pt drops pegs and coins intermittently, but mostly when fatigued.  Pt  will benefit from continued OT to provide neuro re-ed for further strengthening and coordination training in order to increase functional use of L hand for gripping and manipulating ADL supplies with good precision.      OT Education - 04/22/21 1124     Education Details HEP for improving strength and coordination in L hand.    Person(s) Educated Patient    Methods Explanation;Verbal cues    Comprehension Verbalized understanding;Returned demonstration              OT Short Term Goals - 03/21/21 1727       OT SHORT TERM GOAL #1   Title Pt will be indep with HEP for improving strength and coordination in LUE for improved ADL performance.    Baseline Eval: issued theraputty and instructed in grasping and pinching exercises.  Will continue to progress HEP with additional visits.    Time 6    Period Weeks    Status New    Target Date 05/01/21               OT Long Term Goals - 03/21/21 1728       OT LONG TERM GOAL #1   Title Pt will be able to clip his fingernails independently.    Baseline Eval: Difficulty clipping R hand fingernails d/t decreased coordination to L hand.    Time 12    Period Weeks    Status New    Target Date 06/12/21      OT LONG TERM GOAL #2   Title Pt will be able to manage his dialysis supplies and set up efficiently and independently.    Baseline Eval: extra time required d/t decreased coordination in L hand.    Time 12    Period Weeks    Status New    Target Date 06/12/21      OT LONG TERM GOAL #3   Title Pt will be able to confidently hold his coffee cup with L hand without fear of dropping cup.    Baseline Eval: unable to hold coffee cup in L hand.    Time 12    Period Weeks    Status New    Target Date 06/12/21      OT LONG TERM GOAL #4   Title Pt will improve FOTO score to 59 or better to indicate increased functional performance .    Baseline Eval: 57    Time 12    Period Weeks    Status New    Target Date 06/12/21  Plan - 04/22/21 1148     Clinical Impression Statement Pt reports L hand is continuing to improve with grip and pinch.  He verbalized that he was able to clip his nails the other day, but "it wasn't easy."  Pt states that his hand still fatigues as noted by shaking with prolonged gripping.  Pt tolerated treatment activities well with frequent rest breaks.  Pt drops pegs and coins intermittently, but mostly when fatigued.  Pt will benefit from continued OT to provide neuro re-ed for further strengthening and coordination training in order to increase functional use of L hand for gripping and manipulating ADL supplies with good precision.    OT Occupational Profile and History Detailed Assessment- Review of Records and additional review of physical, cognitive, psychosocial history related to current functional performance    Occupational performance deficits (Please refer to evaluation for details): ADL's;IADL's;Leisure    Body Structure / Function / Physical Skills ADL;Coordination;GMC;UE functional use;Sensation;Body mechanics;Flexibility;IADL;Pain;Dexterity;FMC;Strength;ROM    Rehab Potential Good    Clinical Decision Making Several treatment options, min-mod task modification necessary    Comorbidities Affecting Occupational Performance: May have comorbidities impacting occupational performance    Modification or Assistance to Complete Evaluation  No modification of tasks or assist necessary to complete eval    OT Frequency 1x / week    OT Duration 12 weeks    OT Treatment/Interventions Self-care/ADL training;Therapeutic exercise;DME and/or AE instruction;Neuromuscular education;Manual Therapy;Passive range of motion;Therapeutic activities;Patient/family education    Consulted and Agree with Plan of Care Patient             Patient will benefit from skilled therapeutic intervention in order to improve the following deficits and impairments:   Body Structure / Function / Physical  Skills: ADL, Coordination, GMC, UE functional use, Sensation, Body mechanics, Flexibility, IADL, Pain, Dexterity, FMC, Strength, ROM       Visit Diagnosis: Other lack of coordination  Muscle weakness (generalized)    Problem List Patient Active Problem List   Diagnosis Date Noted   History of CVA (cerebrovascular accident) 03/14/2021   Aortic atherosclerosis ([place]) 12/12/2020   Anemia due to vitamin B12 deficiency    Acute CVA (cerebrovascular accident) ([place])    Anemia of chronic disease    Urinary retention    Focal seizure ([place]) 11/20/2020   Thrombocytopenia ([place]) 11/20/2020   ESRD (end stage renal disease) ([place]) 11/20/2020   Hyperkalemia 11/20/2020   Leukocytosis 11/20/2020   ESRD on peritoneal dialysis ([place]) 11/20/2020   Status post shoulder replacement    Rotator cuff arthropathy 11/19/2020   Tobacco abuse 11/07/2020   Hypertensive heart and chronic kidney disease with heart failure and with stage 5 chronic kidney disease, or end stage renal disease ([place]) 07/10/2020   Dialysis patient ([place]) 01/09/2020   Senile purpura ([place]) 01/09/2020   Centrilobular emphysema ([place]) 09/13/2019   Secondary hyperparathyroidism ([place]) 03/14/2019   BPH (benign prostatic hyperplasia) 10/24/2018   Depression 10/24/2018   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   Anemia of chronic kidney failure, stage 5 ([place]) 12/30/2017   Proteinuria 11/25/2017   Gout 08/27/2017   Primary myelofibrosis (HCC)    Major depression, recurrent (HCC)    Benign hypertensive renal disease    CKD (chronic kidney disease) stage 5, GFR less than 15 ml/min (HCC)    Hyperlipidemia LDL goal <70 10/23/2002   CAD (coronary artery disease) 08/22/1996   Leta Speller, MS, OTR/L  Darleene Cleaver 04/22/2021, 11:49 AM  [place]  [place], [place], [address] Phone: [phone]   Fax:  [phone]  Name: DEMARRIO MENGES MRN:  [id number] Date of Birth: [birth year]

## 2021-04-23 DIAGNOSIS — N186 End stage renal disease: Secondary | ICD-10-CM | POA: Diagnosis not present

## 2021-04-23 DIAGNOSIS — Z992 Dependence on renal dialysis: Secondary | ICD-10-CM | POA: Diagnosis not present

## 2021-04-24 ENCOUNTER — Inpatient Hospital Stay: Payer: Medicare HMO | Attending: Internal Medicine

## 2021-04-24 ENCOUNTER — Other Ambulatory Visit (HOSPITAL_COMMUNITY): Payer: Self-pay

## 2021-04-24 ENCOUNTER — Inpatient Hospital Stay: Payer: Medicare HMO

## 2021-04-24 ENCOUNTER — Inpatient Hospital Stay (HOSPITAL_BASED_OUTPATIENT_CLINIC_OR_DEPARTMENT_OTHER): Payer: Medicare HMO | Admitting: Internal Medicine

## 2021-04-24 ENCOUNTER — Encounter: Payer: Self-pay | Admitting: Internal Medicine

## 2021-04-24 DIAGNOSIS — Z992 Dependence on renal dialysis: Secondary | ICD-10-CM | POA: Insufficient documentation

## 2021-04-24 DIAGNOSIS — F17210 Nicotine dependence, cigarettes, uncomplicated: Secondary | ICD-10-CM | POA: Diagnosis not present

## 2021-04-24 DIAGNOSIS — N186 End stage renal disease: Secondary | ICD-10-CM | POA: Diagnosis not present

## 2021-04-24 DIAGNOSIS — I252 Old myocardial infarction: Secondary | ICD-10-CM | POA: Diagnosis not present

## 2021-04-24 DIAGNOSIS — Z7982 Long term (current) use of aspirin: Secondary | ICD-10-CM | POA: Diagnosis not present

## 2021-04-24 DIAGNOSIS — D471 Chronic myeloproliferative disease: Secondary | ICD-10-CM

## 2021-04-24 DIAGNOSIS — Z7952 Long term (current) use of systemic steroids: Secondary | ICD-10-CM | POA: Insufficient documentation

## 2021-04-24 DIAGNOSIS — I120 Hypertensive chronic kidney disease with stage 5 chronic kidney disease or end stage renal disease: Secondary | ICD-10-CM | POA: Diagnosis not present

## 2021-04-24 DIAGNOSIS — Z79899 Other long term (current) drug therapy: Secondary | ICD-10-CM | POA: Diagnosis not present

## 2021-04-24 LAB — COMPREHENSIVE METABOLIC PANEL
ALT: 10 U/L (ref 0–44)
AST: 10 U/L — ABNORMAL LOW (ref 15–41)
Albumin: 3.2 g/dL — ABNORMAL LOW (ref 3.5–5.0)
Alkaline Phosphatase: 41 U/L (ref 38–126)
Anion gap: 9 (ref 5–15)
BUN: 48 mg/dL — ABNORMAL HIGH (ref 8–23)
CO2: 24 mmol/L (ref 22–32)
Calcium: 7.9 mg/dL — ABNORMAL LOW (ref 8.9–10.3)
Chloride: 102 mmol/L (ref 98–111)
Creatinine, Ser: 6.93 mg/dL — ABNORMAL HIGH (ref 0.61–1.24)
GFR, Estimated: 8 mL/min — ABNORMAL LOW (ref >60)
Glucose, Bld: 88 mg/dL (ref 70–99)
Potassium: 4.8 mmol/L (ref 3.5–5.1)
Sodium: 135 mmol/L (ref 135–145)
Total Bilirubin: 0.5 mg/dL (ref 0.3–1.2)
Total Protein: 5.7 g/dL — ABNORMAL LOW (ref 6.5–8.1)

## 2021-04-24 LAB — CBC WITH DIFFERENTIAL/PLATELET
Abs Immature Granulocytes: 0.03 10*3/uL (ref 0.00–0.07)
Basophils Absolute: 0 10*3/uL (ref 0.0–0.1)
Basophils Relative: 0 %
Eosinophils Absolute: 0 10*3/uL (ref 0.0–0.5)
Eosinophils Relative: 1 %
HCT: 32.7 % — ABNORMAL LOW (ref 39.0–52.0)
Hemoglobin: 10.2 g/dL — ABNORMAL LOW (ref 13.0–17.0)
Immature Granulocytes: 1 %
Lymphocytes Relative: 6 %
Lymphs Abs: 0.4 10*3/uL — ABNORMAL LOW (ref 0.7–4.0)
MCH: 30.3 pg (ref 26.0–34.0)
MCHC: 31.2 g/dL (ref 30.0–36.0)
MCV: 97 fL (ref 80.0–100.0)
Monocytes Absolute: 0.2 10*3/uL (ref 0.1–1.0)
Monocytes Relative: 3 %
Neutro Abs: 5.4 10*3/uL (ref 1.7–7.7)
Neutrophils Relative %: 89 %
Platelets: 135 10*3/uL — ABNORMAL LOW (ref 150–400)
RBC: 3.37 MIL/uL — ABNORMAL LOW (ref 4.22–5.81)
RDW: 15.4 % (ref 11.5–15.5)
WBC: 6 10*3/uL (ref 4.0–10.5)
nRBC: 0 % (ref 0.0–0.2)

## 2021-04-24 LAB — SAMPLE TO BLOOD BANK

## 2021-04-24 LAB — LACTATE DEHYDROGENASE: LDH: 540 U/L — ABNORMAL HIGH (ref 98–192)

## 2021-04-24 NOTE — Progress Notes (Signed)
[place] OFFICE PROGRESS NOTE  Patient Care Team: Valerie Roys, DO as PCP - General (Family Medicine) End, Harrell Gave, MD as PCP - Cardiology (Cardiology) Cammie Sickle, MD as Consulting Physician (Internal Medicine)  Cancer Staging No matching staging information was found for the patient.   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   # AUGUST 1st week 2019- Retacrit   # October 2nd 2019- jakafi 10 BID [? Renal involvement]; September 2020-peritoneal dialysis; Jakafi 10 mg in the morning; STOPPED/tapered [id number], 2021 [sec intolerance fatigue/myalgias]  # May 4th 2022- START [name] dosing/PD]; STOPPED after2-3 weeks-because of poor tolerance [sever fatigue/? 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 [name]- kidney Bx- FSG [ on Prednisone Dr.[name]]; July 2020-peritoneal dialysis;   # hx of Lung nodules- resolved [[name]]/quit smoking.  DIAGNOSIS: PRIMARY MYELOFIBROSIS  RISK:LOW     ;GOALS: Control  CURRENT/MOST RECENT THERAPY : Surveillance    Primary myelofibrosis ([place])      INTERVAL HISTORY:  James Arnold 65 y.o.  male pleasant patient above history of primary myelofibrosis Jak 2+ currently off [place] surveillance chronic kidney disease on peritoneal dialysis is here for follow-up.  In the interim patient was evaluated by nephrology for worsening abdominal pain-CT scan showed pneumoperitoneum suggestive of dialysis/peritoneal catheter.  However no acute process noted.  Noted to have chronic splenomegaly slightly worse.  Patient denies any nausea vomiting abdominal pain.  No dizzy spells.  No swelling in the legs.  Review  of Systems  Constitutional:  Positive for malaise/fatigue and weight loss. Negative for chills, diaphoresis and fever.  HENT:  Negative for nosebleeds and sore throat.   Eyes:  Negative for double vision.  Respiratory:  Positive for cough and shortness of breath. Negative for hemoptysis, sputum production and wheezing.   Cardiovascular:  Negative for chest pain, palpitations and orthopnea.  Gastrointestinal:  Negative for abdominal pain, blood in stool, constipation, diarrhea, heartburn, melena, nausea and vomiting.  Genitourinary:  Negative for dysuria, frequency and urgency.  Musculoskeletal:  Positive for back pain and joint pain.  Skin: Negative.  Negative for itching and rash.  Neurological:  Positive for dizziness and tingling. Negative for focal weakness, weakness and headaches.  Endo/Heme/Allergies:  Does not bruise/bleed easily.  Psychiatric/Behavioral:  Negative for depression. The patient is not nervous/anxious and does not have insomnia.      PAST MEDICAL HISTORY :  Past Medical History:  Diagnosis Date  . Anemia   . Benign hypertensive renal disease   . CAD (coronary artery disease)    a. 08/1996 s/p BMS to the mLAD ([facility]); b. 12/2017 MV: EF 46%, fixed apical defect w/ significant GI uptake artifact. No ischemia-> low risk.  . CKD (chronic kidney disease), stage V ([place])    on peritoneal dialysis  . Complication of anesthesia    please call by "[name]" when waking up!!  . COPD (chronic obstructive pulmonary disease) ([place])    centrilobular emphysema. pt is poorly controlled with his copd/smoking.  . Depression   . Diastolic dysfunction    a. 12/2017 Echo: EF 60-65%, no rwma, Gr1 DD, mild AI/MR. Nl RVSP.  [name] Kitchen Dyspnea    with exertion  . Fatigue   . FSGS (focal segmental glomerulosclerosis)   .  Hyperlipidemia 10/2002  . Hypertension   . myelofibrosis    bone marrow failure  . 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 Myocardial infarction ([place]) 1997   stent x 1  .  Smoker   . Thrombocytopenia ([place])     PAST SURGICAL HISTORY :   Past Surgical History:  Procedure Laterality Date  . ANGIOPLASTY    . BONE MARROW ASPIRATION  03/2009  . CARDIAC CATHETERIZATION  1997   stent x 1  . COLONOSCOPY WITH PROPOFOL N/A 08/09/2018   Procedure: COLONOSCOPY WITH PROPOFOL;  Surgeon: [name], MD;  Location: [facility] ENDOSCOPY;  Service: Gastroenterology;  Laterality: N/A;  . CORONARY STENT PLACEMENT  08/1996  . EXTERIORIZATION OF A CONTINUOUS AMBULATORY PERITONEAL DIALYSIS CATHETER    . EYE SURGERY Bilateral    cats removed  . REVERSE SHOULDER ARTHROPLASTY Left 11/19/2020   Procedure: Left reverse shoulder arthroplasty;  Surgeon: Leim Fabry, MD;  Location: [facility] ORS;  Service: Orthopedics;  Laterality: Left;    FAMILY HISTORY :   Family History  Problem Relation Age of Onset  . Stroke Mother        Low BP stroke  . Depression Father   . Depression Sister        Breast  . Heart disease Other   . Breast cancer Other   . Lung cancer Other   . Ovarian cancer Other   . Stomach cancer Other     SOCIAL HISTORY:   Social History   Tobacco Use  . Smoking status: Every Day    Packs/day: 0.75    Years: 47.00    Pack years: 35.25    Types: Cigarettes  . Smokeless tobacco: Never  . Tobacco comments:    trying to cut back-smokes 6 to 7 cigs per day  Vaping Use  . Vaping Use: Never used  Substance Use Topics  . Alcohol use: Yes    Comment: "6 beers weekly on Saturdays"  . Drug use: No    ALLERGIES:  has No Known Allergies.  MEDICATIONS:  Current Outpatient Medications  Medication Sig Dispense Refill  . albuterol (VENTOLIN HFA) 108 (90 Base) MCG/ACT inhaler Inhale 2 puffs into the lungs every 6 (six) hours as needed for wheezing or shortness of breath. 8 g 2  . aspirin 81 MG chewable tablet Chew 1 tablet (81 mg total) by mouth daily. 90 tablet 1  . carvedilol (COREG) 12.5 MG tablet Take 1.5 tablets (18.75 mg total) by mouth 2 (two) times daily. 270  tablet 3  . Cholecalciferol (VITAMIN D-1000 MAX ST) 25 MCG (1000 UT) tablet Take 1,000 Units by mouth daily.     . citalopram (CELEXA) 40 MG tablet Take 1 tablet (40 mg total) by mouth daily. 90 tablet 1  . epoetin alfa-epbx (RETACRIT) 29528 UNIT/ML injection 10,000 Units every 30 (thirty) days. At dialysis    . ferrous sulfate 325 (65 FE) MG tablet Take 1 tablet (325 mg total) by mouth daily with breakfast. 30 tablet 1  . furosemide (LASIX) 20 MG tablet Take 1 tablet (20 mg total) by mouth daily. 90 tablet 1  . gentamicin cream (GARAMYCIN) 0.1 % Apply 1 application topically every other day.    . levETIRAcetam (KEPPRA) 500 MG tablet Take 1 tablet (500 mg total) by mouth at bedtime. 30 tablet 0  . losartan (COZAAR) 50 MG tablet     . omeprazole (PRILOSEC) 20 MG capsule Take 1 capsule (20 mg total) by mouth daily. 90 capsule 1  .  ondansetron (ZOFRAN ODT) 8 MG disintegrating tablet Take 1 tablet (8 mg total) by mouth every 8 (eight) hours as needed for nausea or vomiting. 30 tablet 1  . polyethylene glycol (MIRALAX / GLYCOLAX) 17 g packet Take 17 g by mouth daily as needed. 14 each 0  . predniSONE (DELTASONE) 10 MG tablet Take 10 mg by mouth daily with breakfast.    . rosuvastatin (CRESTOR) 5 MG tablet Take 1 tablet (5 mg total) by mouth every Monday, Wednesday, and Friday. 36 tablet 3  . ruxolitinib phosphate (JAKAFI) 15 MG tablet Take 1 pill Monday Wednesday Friday after dialysis. 12 tablet 4   No current facility-administered medications for this visit.    PHYSICAL EXAMINATION: ECOG PERFORMANCE STATUS: 1 - Symptomatic but completely ambulatory  BP (!) 142/68 (BP Location: Right Arm, Patient Position: Sitting, Cuff Size: Normal)   Pulse 74   Temp 98.4 F (36.9 C) (Tympanic)   Resp 16   Ht _0  (1.702 m)   Wt 150 lb (68 kg)   SpO2 100%   BMI 23.49 kg/m   Filed Weights   04/24/21 0943  Weight: 150 lb (68 kg)    Physical Exam Constitutional:      Comments: Ambulating  independently.  He is alone. Appears cachectic.  HENT:     Head: Normocephalic and atraumatic.     Mouth/Throat:     Pharynx: No oropharyngeal exudate.  Eyes:     Pupils: Pupils are equal, round, and reactive to light.  Cardiovascular:     Rate and Rhythm: Normal rate and regular rhythm.  Pulmonary:     Effort: No respiratory distress.     Breath sounds: No wheezing.     Comments: Decreased air entry bilaterally. Abdominal:     General: Bowel sounds are normal. There is no distension.     Palpations: Abdomen is soft. There is no mass.     Tenderness: no abdominal tenderness There is no guarding or rebound.  Musculoskeletal:        General: No tenderness. Normal range of motion.     Cervical back: Normal range of motion and neck supple.  Skin:    General: Skin is warm.  Neurological:     Mental Status: He is alert and oriented to person, place, and time.     Comments: Left upper extremity weakness noted.    Psychiatric:        Mood and Affect: Affect normal.    LABORATORY DATA:  I have reviewed the data as listed    Component Value Date/Time   NA 135 04/24/2021 0849   NA 138 01/08/2021 0915   K 4.8 04/24/2021 0849   CL 102 04/24/2021 0849   CO2 24 04/24/2021 0849   GLUCOSE 88 04/24/2021 0849   BUN 48 (H) 04/24/2021 0849   BUN 63 (H) 01/08/2021 0915   CREATININE 6.93 (H) 04/24/2021 0849   CREATININE 1.12 09/30/2013 0953   CALCIUM 7.9 (L) 04/24/2021 0849   PROT 5.7 (L) 04/24/2021 0849   PROT 5.5 (L) 01/08/2021 0915   ALBUMIN 3.2 (L) 04/24/2021 0849   ALBUMIN 3.7 (L) 01/08/2021 0915   AST 10 (L) 04/24/2021 0849   ALT 10 04/24/2021 0849   ALKPHOS 41 04/24/2021 0849   BILITOT 0.5 04/24/2021 0849   BILITOT 0.3 01/08/2021 0915   GFRNONAA 8 (L) 04/24/2021 0849   GFRNONAA >60 09/30/2013 0953   GFRAA 14 (L) 07/10/2020 0909   GFRAA >60 09/30/2013 0953    No results found  for: SPEP, UPEP  Lab Results  Component Value Date   WBC 6.0 04/24/2021   NEUTROABS 5.4  04/24/2021   HGB 10.2 (L) 04/24/2021   HCT 32.7 (L) 04/24/2021   MCV 97.0 04/24/2021   PLT 135 (L) 04/24/2021      Chemistry      Component Value Date/Time   NA 135 04/24/2021 0849   NA 138 01/08/2021 0915   K 4.8 04/24/2021 0849   CL 102 04/24/2021 0849   CO2 24 04/24/2021 0849   BUN 48 (H) 04/24/2021 0849   BUN 63 (H) 01/08/2021 0915   CREATININE 6.93 (H) 04/24/2021 0849   CREATININE 1.12 09/30/2013 0953      Component Value Date/Time   CALCIUM 7.9 (L) 04/24/2021 0849   ALKPHOS 41 04/24/2021 0849   AST 10 (L) 04/24/2021 0849   ALT 10 04/24/2021 0849   BILITOT 0.5 04/24/2021 0849   BILITOT 0.3 01/08/2021 0915       RADIOGRAPHIC STUDIES: I have personally reviewed the radiological images as listed and agreed with the findings in the report. No results found.   ASSESSMENT & PLAN:  Primary myelofibrosis ([place]) # Primary Myelofiborisis [Bone marrow 2010] jak-2 POSITIVE;  February 2022-worsening splenomegaly/worsening fatigue. Patient discontinued Jakafi-because of poor tolerance.  # CT June 2022- [abdominal pain /pneumoperitoneum]- 820 cc; worsening-however patient is stable anemia stable.  Poor tolerance to Jakafi.  Could consider Fedratinib-however concerned about tolerance.  #Orthostatic hypotension- improved- defer to Nephrology.   #Anemia hemoglobin is 8-9. Hb today- 10.2.  Defer to nephroloEPO stimulating agent /IV iron as per nephrology  #Stroke/left upper extremity weakness/seizures- [s/p shoulder surgery] STABLE on antiplatelet therapy.   #COPD-poorly controlled; declines PFTs; continue prednisone 10 mg a day; STABLE.  #End-stage renal disease [Dr. Latif]  on PD; STABLE   # COVID vaccine: DECLINED Covid-19 vaccine; recommend restart acyclovir.  # DISPOSITION:  # HOLD PRBC transfusion # follow up in 1 months-labs- MD-CBC/CMP;LDH;HOLD tube; posssible1 unit PRBC tranfusion--Dr.B  Orders Placed This Encounter  Procedures  . CBC with Differential     Standing Status:   Standing    Number of Occurrences:   12    Standing Expiration Date:   04/24/2022  . Comprehensive metabolic panel    Standing Status:   Standing    Number of Occurrences:   12    Standing Expiration Date:   04/24/2022  . Lactate dehydrogenase    Standing Status:   Standing    Number of Occurrences:   12    Standing Expiration Date:   04/24/2022  . Hold Tube- Blood Bank    Standing Status:   Standing    Number of Occurrences:   12    Standing Expiration Date:   04/24/2022   All questions were answered. The patient knows to call the clinic with any problems, questions or concerns.     Cammie Sickle, MD 04/25/2021 7:29 AM

## 2021-04-24 NOTE — Assessment & Plan Note (Addendum)
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 Patient discontinued Jakafi-because of poor tolerance.  # CT June 2022- [abdominal pain /pneumoperitoneum]- 820 cc; worsening-however patient is stable anemia stable.  Poor tolerance to Jakafi.  Could consider Fedratinib-however concerned about tolerance.  #Orthostatic hypotension- improved- defer to Nephrology.   #Anemia hemoglobin is 8-9. Hb today- 10.2.  Defer to nephroloEPO stimulating agent /IV iron as per nephrology  #Stroke/left upper extremity weakness/seizures- [s/p shoulder surgery] STABLE on antiplatelet therapy.  #COPD-poorly controlled; declines PFTs; continue prednisone 10 mg a day; STABLE.  #End-stage renal disease [Dr. Latif]  on PD; STABLE   # COVID vaccine: DECLINED Covid-19 vaccine; recommend restart acyclovir.  # DISPOSITION:  # HOLD PRBC transfusion # follow up in 1 months-labs- MD-CBC/CMP;LDH;HOLD tube; posssible1 unit PRBC tranfusion--Dr.B

## 2021-04-25 ENCOUNTER — Encounter: Payer: Self-pay | Admitting: Internal Medicine

## 2021-04-25 DIAGNOSIS — N186 End stage renal disease: Secondary | ICD-10-CM | POA: Diagnosis not present

## 2021-04-25 DIAGNOSIS — Z992 Dependence on renal dialysis: Secondary | ICD-10-CM | POA: Diagnosis not present

## 2021-04-26 DIAGNOSIS — Z992 Dependence on renal dialysis: Secondary | ICD-10-CM | POA: Diagnosis not present

## 2021-04-26 DIAGNOSIS — N186 End stage renal disease: Secondary | ICD-10-CM | POA: Diagnosis not present

## 2021-04-28 DIAGNOSIS — Z992 Dependence on renal dialysis: Secondary | ICD-10-CM | POA: Diagnosis not present

## 2021-04-28 DIAGNOSIS — N186 End stage renal disease: Secondary | ICD-10-CM | POA: Diagnosis not present

## 2021-04-29 ENCOUNTER — Ambulatory Visit: Payer: Medicare HMO

## 2021-04-29 ENCOUNTER — Other Ambulatory Visit: Payer: Self-pay

## 2021-04-29 DIAGNOSIS — M6281 Muscle weakness (generalized): Secondary | ICD-10-CM | POA: Diagnosis not present

## 2021-04-29 DIAGNOSIS — Z992 Dependence on renal dialysis: Secondary | ICD-10-CM | POA: Diagnosis not present

## 2021-04-29 DIAGNOSIS — R278 Other lack of coordination: Secondary | ICD-10-CM

## 2021-04-29 DIAGNOSIS — N186 End stage renal disease: Secondary | ICD-10-CM | POA: Diagnosis not present

## 2021-04-29 NOTE — Therapy (Signed)
[place] MAIN [facility] SERVICES [address] [place], [place], [address] Phone: [phone]   Fax:  [phone]  Occupational Therapy Treatment  Patient Details  Name: James Arnold MRN: [id number] Date of Birth: [birth year] Referring Provider (OT): Dr. Manuella Ghazi   Encounter Date: 04/29/2021   OT End of Session - 04/29/21 1156     Visit Number 5    Number of Visits 12    Date for OT Re-Evaluation 06/12/21    Authorization Type Reporting period beginning 03/21/2021    OT Start Time 1050    OT Stop Time 1134    OT Time Calculation (min) 44 min    Activity Tolerance Patient tolerated treatment well    Behavior During Therapy [facility] for tasks assessed/performed             Past Medical History:  Diagnosis Date   Anemia    Benign hypertensive renal disease    CAD (coronary artery disease)    a. 08/1996 s/p BMS to the mLAD ([facility]); b. 12/2017 MV: EF 46%, fixed apical defect w/ significant GI uptake artifact. No ischemia-> low risk.   CKD (chronic kidney disease), stage V ([place])    on peritoneal dialysis   Complication of anesthesia    please call by "[name]" when waking up!!   COPD (chronic obstructive pulmonary disease) (HCC)    centrilobular emphysema. pt is poorly controlled with his copd/smoking.   Depression    Diastolic dysfunction    a. 12/2017 Echo: EF 60-65%, no rwma, Gr1 DD, mild AI/MR. Nl RVSP.   Dyspnea    with exertion   Fatigue    FSGS (focal segmental glomerulosclerosis)    Hyperlipidemia 10/2002   Hypertension    myelofibrosis    bone marrow failure   Myelofibrosis ([place]) 2010   JAK2 (+) primary   Myocardial infarction ([place]) 1997   stent x 1   Smoker    Thrombocytopenia ([place])     Past Surgical History:  Procedure Laterality Date   ANGIOPLASTY     BONE MARROW ASPIRATION  03/2009   CARDIAC CATHETERIZATION  1997   stent x 1   COLONOSCOPY WITH PROPOFOL N/A 08/09/2018   Procedure: COLONOSCOPY WITH PROPOFOL;  Surgeon: [name], MD;  Location: [facility] ENDOSCOPY;  Service: Gastroenterology;  Laterality: N/A;   CORONARY STENT PLACEMENT  08/1996   EXTERIORIZATION OF A CONTINUOUS AMBULATORY PERITONEAL DIALYSIS CATHETER     EYE SURGERY Bilateral    cats removed   REVERSE SHOULDER ARTHROPLASTY Left 11/19/2020   Procedure: Left reverse shoulder arthroplasty;  Surgeon: Leim Fabry, MD;  Location: [facility] ORS;  Service: Orthopedics;  Laterality: Left;    There were no vitals filed for this visit.   Subjective Assessment - 04/29/21 1154     Subjective  "I was able to pour the coffee pot with my left hand today.  I haven't been able to do that in a long time."    Pertinent History L reverse TSA on 11/19/2020    Patient Stated Goals "I want to use my arm better."    Currently in Pain? Yes    Pain Score 2     Pain Location Shoulder    Pain Orientation Right;Left    Pain Descriptors / Indicators Aching;Tightness    Pain Type Chronic pain    Pain Radiating Towards upper arm    Pain Onset More than a month ago    Pain Frequency Intermittent    Aggravating Factors  prolonged  reaching    Pain Relieving Factors rest    Multiple Pain Sites No            Occupational Therapy Treatment: Neuro re-ed:  Pt participated in LUE strengthening and coordination activities, working to improve accuracy and efficiency of LUE for self care.  Used therapy resistant clothespins to place on vertical dowel at a target, removed and placed into container.  Pt's hand shakes when working toward a targeted placement, and pt requires intermittent rest breaks d/t L shoulder and hand fatigue.  Used hand gripper at 11.2 lbs to remove jumbo pegs from pegboard.  Addressed object manipulation in L hand, rotating jumbo pegs 180 degrees, and placing in peg hole.  Targeted placement of washers onto horizontal dowel, holding dowel in R hand, placing washers with L with fair accuracy d/t L hand shakes.  Able to pick up 10 washers in L hand and discard from  palm 1 at a time with fair accuracy.    Response to Treatment: Pt reported that he was able to pour the coffee pot with L hand today, which he hasn't done since before his stroke.  OT encouraged increasing theraputty use at home to eat least 4-5 x per week for continued grip strengthening.  Pt agreed and stated he'd been using it maybe 3x per week.  Pt is pleased with his progress but continues to report some shaking in his L hand with [facility] activities.  Pt will continue to benefit from skilled OT to provide neuro re-ed throughout LUE for improving accuracy and efficiency with functional use of LUE for self care.       OT Education - 04/29/21 1156     Education Details HEP for improving strength and coordination in L hand; encouraged use of theraputty at least 4-5x per week    Person(s) Educated Patient    Methods Explanation;Verbal cues    Comprehension Verbalized understanding;Returned demonstration              OT Short Term Goals - 03/21/21 1727       OT SHORT TERM GOAL #1   Title Pt will be indep with HEP for improving strength and coordination in LUE for improved ADL performance.    Baseline Eval: issued theraputty and instructed in grasping and pinching exercises.  Will continue to progress HEP with additional visits.    Time 6    Period Weeks    Status New    Target Date 05/01/21               OT Long Term Goals - 03/21/21 1728       OT LONG TERM GOAL #1   Title Pt will be able to clip his fingernails independently.    Baseline Eval: Difficulty clipping R hand fingernails d/t decreased coordination to L hand.    Time 12    Period Weeks    Status New    Target Date 06/12/21      OT LONG TERM GOAL #2   Title Pt will be able to manage his dialysis supplies and set up efficiently and independently.    Baseline Eval: extra time required d/t decreased coordination in L hand.    Time 12    Period Weeks    Status New    Target Date 06/12/21      OT LONG TERM  GOAL #3   Title Pt will be able to confidently hold his coffee cup with L hand without fear of dropping  cup.    Baseline Eval: unable to hold coffee cup in L hand.    Time 12    Period Weeks    Status New    Target Date 06/12/21      OT LONG TERM GOAL #4   Title Pt will improve FOTO score to 59 or better to indicate increased functional performance .    Baseline Eval: 57    Time 12    Period Weeks    Status New    Target Date 06/12/21                   Plan - 04/29/21 1254     Clinical Impression Statement Pt reported that he was able to pour the coffee pot with L hand today, which he hasn't done since before his stroke.  OT encouraged increasing theraputty use at home to eat least 4-5 x per week for continued grip strengthening.  Pt agreed and stated he'd been using it maybe 3x per week.  Pt is pleased with his progress but continues to report some shaking in his L hand with [facility] activities.  Pt will continue to benefit from skilled OT to provide neuro re-ed throughout LUE for improving accuracy and efficiency with functional use of LUE for self care.    OT Occupational Profile and History Detailed Assessment- Review of Records and additional review of physical, cognitive, psychosocial history related to current functional performance    Occupational performance deficits (Please refer to evaluation for details): ADL's;IADL's;Leisure    Body Structure / Function / Physical Skills ADL;Coordination;GMC;UE functional use;Sensation;Body mechanics;Flexibility;IADL;Pain;Dexterity;FMC;Strength;ROM    Rehab Potential Good    Clinical Decision Making Several treatment options, min-mod task modification necessary    Comorbidities Affecting Occupational Performance: May have comorbidities impacting occupational performance    Modification or Assistance to Complete Evaluation  No modification of tasks or assist necessary to complete eval    OT Frequency 1x / week    OT Duration 12 weeks     OT Treatment/Interventions Self-care/ADL training;Therapeutic exercise;DME and/or AE instruction;Neuromuscular education;Manual Therapy;Passive range of motion;Therapeutic activities;Patient/family education    Consulted and Agree with Plan of Care Patient             Patient will benefit from skilled therapeutic intervention in order to improve the following deficits and impairments:   Body Structure / Function / Physical Skills: ADL, Coordination, GMC, UE functional use, Sensation, Body mechanics, Flexibility, IADL, Pain, Dexterity, FMC, Strength, ROM       Visit Diagnosis: Other lack of coordination  Muscle weakness (generalized)    Problem List Patient Active Problem List   Diagnosis Date Noted   History of CVA (cerebrovascular accident) 03/14/2021   Aortic atherosclerosis ([place]) 12/12/2020   Anemia due to vitamin B12 deficiency    Acute CVA (cerebrovascular accident) ([place])    Anemia of chronic disease    Urinary retention    Focal seizure ([place]) 11/20/2020   Thrombocytopenia ([place]) 11/20/2020   ESRD (end stage renal disease) ([place]) 11/20/2020   Hyperkalemia 11/20/2020   Leukocytosis 11/20/2020   ESRD on peritoneal dialysis ([place]) 11/20/2020   Status post shoulder replacement    Rotator cuff arthropathy 11/19/2020   Tobacco abuse 11/07/2020   Hypertensive heart and chronic kidney disease with heart failure and with stage 5 chronic kidney disease, or end stage renal disease ([place]) 07/10/2020   Dialysis patient ([place]) 01/09/2020   Senile purpura ([place]) 01/09/2020   Centrilobular emphysema ([place]) 09/13/2019   Secondary hyperparathyroidism ([place])  03/14/2019   BPH (benign prostatic hyperplasia) 10/24/2018   Depression 10/24/2018   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   Anemia of chronic kidney failure, stage 5 ([place]) 12/30/2017   Proteinuria 11/25/2017   Gout 08/27/2017   Primary myelofibrosis ([place])    Major depression, recurrent (HCC)    Benign hypertensive  renal disease    CKD (chronic kidney disease) stage 5, GFR less than 15 ml/min (HCC)    Hyperlipidemia LDL goal <70 10/23/2002   CAD (coronary artery disease) 08/22/1996   Leta Speller, MS, OTR/L  Darleene Cleaver 04/29/2021, 12:55 PM  [place] MAIN [facility] SERVICES [address] [place], [place], [address] Phone: [phone]   Fax:  [phone]  Name: KOUA DEEG MRN: [id number] Date of Birth: 1956-09-03

## 2021-04-30 DIAGNOSIS — Z992 Dependence on renal dialysis: Secondary | ICD-10-CM | POA: Diagnosis not present

## 2021-04-30 DIAGNOSIS — N186 End stage renal disease: Secondary | ICD-10-CM | POA: Diagnosis not present

## 2021-05-01 DIAGNOSIS — N186 End stage renal disease: Secondary | ICD-10-CM | POA: Diagnosis not present

## 2021-05-01 DIAGNOSIS — Z992 Dependence on renal dialysis: Secondary | ICD-10-CM | POA: Diagnosis not present

## 2021-05-02 DIAGNOSIS — Z992 Dependence on renal dialysis: Secondary | ICD-10-CM | POA: Diagnosis not present

## 2021-05-02 DIAGNOSIS — N186 End stage renal disease: Secondary | ICD-10-CM | POA: Diagnosis not present

## 2021-05-03 DIAGNOSIS — Z992 Dependence on renal dialysis: Secondary | ICD-10-CM | POA: Diagnosis not present

## 2021-05-03 DIAGNOSIS — N186 End stage renal disease: Secondary | ICD-10-CM | POA: Diagnosis not present

## 2021-05-05 DIAGNOSIS — Z992 Dependence on renal dialysis: Secondary | ICD-10-CM | POA: Diagnosis not present

## 2021-05-05 DIAGNOSIS — N186 End stage renal disease: Secondary | ICD-10-CM | POA: Diagnosis not present

## 2021-05-06 ENCOUNTER — Other Ambulatory Visit: Payer: Self-pay

## 2021-05-06 ENCOUNTER — Ambulatory Visit: Payer: Medicare HMO

## 2021-05-06 DIAGNOSIS — M6281 Muscle weakness (generalized): Secondary | ICD-10-CM

## 2021-05-06 DIAGNOSIS — R278 Other lack of coordination: Secondary | ICD-10-CM

## 2021-05-06 DIAGNOSIS — N186 End stage renal disease: Secondary | ICD-10-CM | POA: Diagnosis not present

## 2021-05-06 DIAGNOSIS — Z992 Dependence on renal dialysis: Secondary | ICD-10-CM | POA: Diagnosis not present

## 2021-05-06 NOTE — Therapy (Signed)
[place] MAIN [facility] SERVICES [address] [place], [place], [address] Phone: [phone]   Fax:  [phone]  Occupational Therapy Treatment  Patient Details  Name: James Arnold MRN: [id number] Date of Birth: [birth year] Referring Provider (OT): Dr. Manuella Ghazi   Encounter Date: 05/06/2021   OT End of Session - 05/06/21 0956     Visit Number 6    Number of Visits 12    Date for OT Re-Evaluation 06/12/21    Authorization Type Reporting period beginning 03/21/2021    OT Start Time 0900    OT Stop Time 0945    OT Time Calculation (min) 45 min    Activity Tolerance Patient tolerated treatment well    Behavior During Therapy [facility] for tasks assessed/performed             Past Medical History:  Diagnosis Date   Anemia    Benign hypertensive renal disease    CAD (coronary artery disease)    a. 08/1996 s/p BMS to the mLAD ([facility]); b. 12/2017 MV: EF 46%, fixed apical defect w/ significant GI uptake artifact. No ischemia-> low risk.   CKD (chronic kidney disease), stage V ([place])    on peritoneal dialysis   Complication of anesthesia    please call by "[name]" when waking up!!   COPD (chronic obstructive pulmonary disease) (HCC)    centrilobular emphysema. pt is poorly controlled with his copd/smoking.   Depression    Diastolic dysfunction    a. 12/2017 Echo: EF 60-65%, no rwma, Gr1 DD, mild AI/MR. Nl RVSP.   Dyspnea    with exertion   Fatigue    FSGS (focal segmental glomerulosclerosis)    Hyperlipidemia 10/2002   Hypertension    myelofibrosis    bone marrow failure   Myelofibrosis ([place]) 2010   JAK2 (+) primary   Myocardial infarction ([place]) 1997   stent x 1   Smoker    Thrombocytopenia ([place])     Past Surgical History:  Procedure Laterality Date   ANGIOPLASTY     BONE MARROW ASPIRATION  03/2009   CARDIAC CATHETERIZATION  1997   stent x 1   COLONOSCOPY WITH PROPOFOL N/A 08/09/2018   Procedure: COLONOSCOPY WITH PROPOFOL;  Surgeon: [name], MD;  Location[facility] ENDOSCOPY;  Service: Gastroenterology;  Laterality: N/A;   CORONARY STENT PLACEMENT  08/1996   EXTERIORIZATION OF A CONTINUOUS AMBULATORY PERITONEAL DIALYSIS CATHETER     EYE SURGERY Bilateral    cats removed   REVERSE SHOULDER ARTHROPLASTY Left 11/19/2020   Procedure: Left reverse shoulder arthroplasty;  Surgeon: Leim Fabry, MD;  Location: [facility] ORS;  Service: Orthopedics;  Laterality: Left;    There were no vitals filed for this visit.   Subjective Assessment - 05/06/21 0955     Subjective  "I'm starting to be able to lift some boxes."    Pertinent History L reverse TSA on 11/19/2020    Patient Stated Goals "I want to use my arm better."    Currently in Pain? No/denies    Pain Score 0-No pain    Pain Onset More than a month ago            Occupational Therapy Treatment: Neuro re-ed: Participation in hand strengthening and coordination activities for L hand, working to increase functional use of LUE for ADLs/IADLs.  Picked up small washers from dish and placed over vertical dowels, picked up grooved pegs and placed into grooved slots and removed with tweezers using L hand, shuffled  and dealt cards with focus on speed.  Used hand gripper at moderate resistance for 5 sets 10 squeezes for grip strengthening.  Vc to allow rest breaks to prevent over fatigue of hand.  Cued pt for placing forearm on table when possible to perform [facility] activities for better control of L hand and to minimize exertion to L shoulder.   Response to Treatment: Pt making steady gains with all OT goals.  Pt reports he's starting to be able to lift boxes at home again and was able to clips nails with extra time but good accuracy.  Distal LUE remains slightly ataxic with weak grip and pinch.  Pt will continue to benefit from skilled OT to address these deficits for improving functional use of LUE for ADLs/IADLs.    OT Education - 05/06/21 0956     Education Details coordination training L  hand    Person(s) Educated Patient    Methods Explanation;Verbal cues    Comprehension Verbalized understanding;Returned demonstration              OT Short Term Goals - 03/21/21 1727       OT SHORT TERM GOAL #1   Title Pt will be indep with HEP for improving strength and coordination in LUE for improved ADL performance.    Baseline Eval: issued theraputty and instructed in grasping and pinching exercises.  Will continue to progress HEP with additional visits.    Time 6    Period Weeks    Status New    Target Date 05/01/21               OT Long Term Goals - 03/21/21 1728       OT LONG TERM GOAL #1   Title Pt will be able to clip his fingernails independently.    Baseline Eval: Difficulty clipping R hand fingernails d/t decreased coordination to L hand.    Time 12    Period Weeks    Status New    Target Date 06/12/21      OT LONG TERM GOAL #2   Title Pt will be able to manage his dialysis supplies and set up efficiently and independently.    Baseline Eval: extra time required d/t decreased coordination in L hand.    Time 12    Period Weeks    Status New    Target Date 06/12/21      OT LONG TERM GOAL #3   Title Pt will be able to confidently hold his coffee cup with L hand without fear of dropping cup.    Baseline Eval: unable to hold coffee cup in L hand.    Time 12    Period Weeks    Status New    Target Date 06/12/21      OT LONG TERM GOAL #4   Title Pt will improve FOTO score to 59 or better to indicate increased functional performance .    Baseline Eval: 57    Time 12    Period Weeks    Status New    Target Date 06/12/21                   Plan - 05/06/21 1003     Clinical Impression Statement Pt making steady gains with all OT goals.  Pt reports he's starting to be able to lift boxes at home again and was able to clips nails with extra time but good accuracy.  Distal LUE remains slightly ataxic with weak  grip and pinch.  Pt will continue  to benefit from skilled OT to address these deficits for improving functional use of LUE for ADLs/IADLs.    OT Occupational Profile and History Detailed Assessment- Review of Records and additional review of physical, cognitive, psychosocial history related to current functional performance    Occupational performance deficits (Please refer to evaluation for details): ADL's;IADL's;Leisure    Body Structure / Function / Physical Skills ADL;Coordination;GMC;UE functional use;Sensation;Body mechanics;Flexibility;IADL;Pain;Dexterity;FMC;Strength;ROM    Rehab Potential Good    Clinical Decision Making Several treatment options, min-mod task modification necessary    Comorbidities Affecting Occupational Performance: May have comorbidities impacting occupational performance    Modification or Assistance to Complete Evaluation  No modification of tasks or assist necessary to complete eval    OT Frequency 1x / week    OT Duration 12 weeks    OT Treatment/Interventions Self-care/ADL training;Therapeutic exercise;DME and/or AE instruction;Neuromuscular education;Manual Therapy;Passive range of motion;Therapeutic activities;Patient/family education    Consulted and Agree with Plan of Care Patient             Patient will benefit from skilled therapeutic intervention in order to improve the following deficits and impairments:   Body Structure / Function / Physical Skills: ADL, Coordination, GMC, UE functional use, Sensation, Body mechanics, Flexibility, IADL, Pain, Dexterity, FMC, Strength, ROM       Visit Diagnosis: Other lack of coordination  Muscle weakness (generalized)    Problem List Patient Active Problem List   Diagnosis Date Noted   History of CVA (cerebrovascular accident) 03/14/2021   Aortic atherosclerosis ([place]) 12/12/2020   Anemia due to vitamin B12 deficiency    Acute CVA (cerebrovascular accident) ([place])    Anemia of chronic disease    Urinary retention    Focal seizure  ([place]) 11/20/2020   Thrombocytopenia ([place]) 11/20/2020   ESRD (end stage renal disease) ([place]) 11/20/2020   Hyperkalemia 11/20/2020   Leukocytosis 11/20/2020   ESRD on peritoneal dialysis ([place]) 11/20/2020   Status post shoulder replacement    Rotator cuff arthropathy 11/19/2020   Tobacco abuse 11/07/2020   Hypertensive heart and chronic kidney disease with heart failure and with stage 5 chronic kidney disease, or end stage renal disease ([place]) 07/10/2020   Dialysis patient ([place]) 01/09/2020   Senile purpura ([place]) 01/09/2020   Centrilobular emphysema ([place]) 09/13/2019   Secondary hyperparathyroidism ([place]) 03/14/2019   BPH (benign prostatic hyperplasia) 10/24/2018   Depression 10/24/2018   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   Anemia of chronic kidney failure, stage 5 ([place]) 12/30/2017   Proteinuria 11/25/2017   Gout 08/27/2017   Primary myelofibrosis (HCC)    Major depression, recurrent (HCC)    Benign hypertensive renal disease    CKD (chronic kidney disease) stage 5, GFR less than 15 ml/min (HCC)    Hyperlipidemia LDL goal <70 10/23/2002   CAD (coronary artery disease) 08/22/1996   Leta Speller, MS, OTR/L  Darleene Cleaver 05/06/2021, 10:04 AM  [place], [place], [address] Phone: [phone]   Fax:  [phone]  Name: James Arnold MRN: [id number] Date of Birth: 1956/03/01

## 2021-05-07 DIAGNOSIS — Z992 Dependence on renal dialysis: Secondary | ICD-10-CM | POA: Diagnosis not present

## 2021-05-07 DIAGNOSIS — N186 End stage renal disease: Secondary | ICD-10-CM | POA: Diagnosis not present

## 2021-05-08 DIAGNOSIS — N186 End stage renal disease: Secondary | ICD-10-CM | POA: Diagnosis not present

## 2021-05-08 DIAGNOSIS — Z992 Dependence on renal dialysis: Secondary | ICD-10-CM | POA: Diagnosis not present

## 2021-05-09 DIAGNOSIS — Z992 Dependence on renal dialysis: Secondary | ICD-10-CM | POA: Diagnosis not present

## 2021-05-09 DIAGNOSIS — N186 End stage renal disease: Secondary | ICD-10-CM | POA: Diagnosis not present

## 2021-05-10 DIAGNOSIS — Z992 Dependence on renal dialysis: Secondary | ICD-10-CM | POA: Diagnosis not present

## 2021-05-10 DIAGNOSIS — N186 End stage renal disease: Secondary | ICD-10-CM | POA: Diagnosis not present

## 2021-05-12 DIAGNOSIS — N186 End stage renal disease: Secondary | ICD-10-CM | POA: Diagnosis not present

## 2021-05-12 DIAGNOSIS — Z992 Dependence on renal dialysis: Secondary | ICD-10-CM | POA: Diagnosis not present

## 2021-05-13 ENCOUNTER — Ambulatory Visit: Payer: Medicare HMO

## 2021-05-13 ENCOUNTER — Other Ambulatory Visit: Payer: Self-pay

## 2021-05-13 DIAGNOSIS — M6281 Muscle weakness (generalized): Secondary | ICD-10-CM | POA: Diagnosis not present

## 2021-05-13 DIAGNOSIS — Z992 Dependence on renal dialysis: Secondary | ICD-10-CM | POA: Diagnosis not present

## 2021-05-13 DIAGNOSIS — N186 End stage renal disease: Secondary | ICD-10-CM | POA: Diagnosis not present

## 2021-05-13 DIAGNOSIS — R278 Other lack of coordination: Secondary | ICD-10-CM | POA: Diagnosis not present

## 2021-05-13 NOTE — Therapy (Signed)
[place] MAIN [facility] SERVICES [address] [place], [place], [address] Phone: [phone]   Fax:  [phone]  Occupational Therapy Treatment  Patient Details  Name: TOME WILSON MRN: [id number] Date of Birth: [birth year] Referring Provider (OT): Dr. Manuella Ghazi   Encounter Date: 05/13/2021   OT End of Session - 05/13/21 0956     Visit Number 7    Number of Visits 12    Date for OT Re-Evaluation 06/12/21    Authorization Type Reporting period beginning 03/21/2021    OT Start Time 0900    OT Stop Time 0945    OT Time Calculation (min) 45 min    Activity Tolerance Patient tolerated treatment well    Behavior During Therapy [facility] for tasks assessed/performed             Past Medical History:  Diagnosis Date   Anemia    Benign hypertensive renal disease    CAD (coronary artery disease)    a. 08/1996 s/p BMS to the mLAD ([facility]); b. 12/2017 MV: EF 46%, fixed apical defect w/ significant GI uptake artifact. No ischemia-> low risk.   CKD (chronic kidney disease), stage V ([place])    on peritoneal dialysis   Complication of anesthesia    please call by "[name]" when waking up!!   COPD (chronic obstructive pulmonary disease) (HCC)    centrilobular emphysema. pt is poorly controlled with his copd/smoking.   Depression    Diastolic dysfunction    a. 12/2017 Echo: EF 60-65%, no rwma, Gr1 DD, mild AI/MR. Nl RVSP.   Dyspnea    with exertion   Fatigue    FSGS (focal segmental glomerulosclerosis)    Hyperlipidemia 10/2002   Hypertension    myelofibrosis    bone marrow failure   Myelofibrosis ([place]) 2010   JAK2 (+) primary   Myocardial infarction ([place]) 1997   stent x 1   Smoker    Thrombocytopenia ([place])     Past Surgical History:  Procedure Laterality Date   ANGIOPLASTY     BONE MARROW ASPIRATION  03/2009   CARDIAC CATHETERIZATION  1997   stent x 1   COLONOSCOPY WITH PROPOFOL N/A 08/09/2018   Procedure: COLONOSCOPY WITH PROPOFOL;  Surgeon: [name], MD;  Location: [facility] ENDOSCOPY;  Service: Gastroenterology;  Laterality: N/A;   CORONARY STENT PLACEMENT  08/1996   EXTERIORIZATION OF A CONTINUOUS AMBULATORY PERITONEAL DIALYSIS CATHETER     EYE SURGERY Bilateral    cats removed   REVERSE SHOULDER ARTHROPLASTY Left 11/19/2020   Procedure: Left reverse shoulder arthroplasty;  Surgeon: Leim Fabry, MD;  Location: [facility] ORS;  Service: Orthopedics;  Laterality: Left;    There were no vitals filed for this visit.   Subjective Assessment - 05/13/21 0908     Subjective  "I moved some more boxes this weekend.  Felt pretty good."    Pertinent History L reverse TSA on 11/19/2020    Patient Stated Goals "I want to use my arm better."    Currently in Pain? Yes    Pain Score 2     Pain Onset More than a month ago                [facility] OT Assessment - 05/13/21 0001       Observation/Other Assessments   Focus on Therapeutic Outcomes (FOTO)  76      Coordination   Right 9 Hole Peg Test 22 sec    Left 9 Hole Peg Test 33  sec      Hand Function   Right Hand Grip (lbs) 58    Left Hand Grip (lbs) 33            Occupational Therapy Treatment: Neuro re-ed: Participation in L hand strengthening and coordination activities.  Used hand gripper with moderate resistance to complete 6 sets 10 squeezes, vc for rest between sets.  Used small hand tools to disassemble small helicopter to facilitate bilat hand coordination.  Worked with skinny 1 inch dowels and pegboard, with practice to pick up, place, rotate pegs on pegboard, keep 5 in hand and discard from palm without dropping; fair accuracy.  Placed board on elevated surface to further challenge targeted reach with LUE; 1 trial only d/t L shoulder fatigue.    Response to Treatment: Pt is making steady gains with strength, coordination, and functional performance with LUE as noted above in assessment section.  Pt reports he moved a lot of boxes at home this weekend doing home improvement  tasks.  Pt reports he is using his arm better at home and feels like he can "nearly" do what he needs to do.  Pt will benefit from continued skilled OT to continue to progress distal LUE strength and coordination as pt still fumbles and drops small objects in hand, and continues to present with L grip strength deficit as compared to baseline.  Encouraged pt to continue to use theraputty at home for grip strengthening.  Pt in agreement with plan to continue with OT to further progress with OT goals.      OT Education - 05/13/21 0956     Education Details HEP    Person(s) Educated Patient    Methods Explanation;Verbal cues    Comprehension Verbalized understanding;Returned demonstration              OT Short Term Goals - 03/21/21 1727       OT SHORT TERM GOAL #1   Title Pt will be indep with HEP for improving strength and coordination in LUE for improved ADL performance.    Baseline Eval: issued theraputty and instructed in grasping and pinching exercises.  Will continue to progress HEP with additional visits.    Time 6    Period Weeks    Status New    Target Date 05/01/21               OT Long Term Goals - 03/21/21 1728       OT LONG TERM GOAL #1   Title Pt will be able to clip his fingernails independently.    Baseline Eval: Difficulty clipping R hand fingernails d/t decreased coordination to L hand.    Time 12    Period Weeks    Status New    Target Date 06/12/21      OT LONG TERM GOAL #2   Title Pt will be able to manage his dialysis supplies and set up efficiently and independently.    Baseline Eval: extra time required d/t decreased coordination in L hand.    Time 12    Period Weeks    Status New    Target Date 06/12/21      OT LONG TERM GOAL #3   Title Pt will be able to confidently hold his coffee cup with L hand without fear of dropping cup.    Baseline Eval: unable to hold coffee cup in L hand.    Time 12    Period Weeks    Status New  Target Date  06/12/21      OT LONG TERM GOAL #4   Title Pt will improve FOTO score to 59 or better to indicate increased functional performance .    Baseline Eval: 57    Time 12    Period Weeks    Status New    Target Date 06/12/21                   Plan - 05/13/21 1008     Clinical Impression Statement Pt is making steady gains with strength, coordination, and functional performance with LUE as noted above in assessment section.  Pt reports he moved a lot of boxes at home this weekend doing home improvement tasks.  Pt reports he is using his arm better at home and feels like he can "nearly" do what he needs to do.  Pt will benefit from continued skilled OT to continue to progress distal LUE strength and coordination as pt still fumbles and drops small objects in hand, and continues to present with L grip strength deficit as compared to baseline.  Encouraged pt to continue to use theraputty at home for grip strengthening.  Pt in agreement with plan to continue with OT to further progress with OT goals.    OT Occupational Profile and History Detailed Assessment- Review of Records and additional review of physical, cognitive, psychosocial history related to current functional performance    Occupational performance deficits (Please refer to evaluation for details): ADL's;IADL's;Leisure    Body Structure / Function / Physical Skills ADL;Coordination;GMC;UE functional use;Sensation;Body mechanics;Flexibility;IADL;Pain;Dexterity;FMC;Strength;ROM    Rehab Potential Good    Clinical Decision Making Several treatment options, min-mod task modification necessary    Comorbidities Affecting Occupational Performance: May have comorbidities impacting occupational performance    Modification or Assistance to Complete Evaluation  No modification of tasks or assist necessary to complete eval    OT Frequency 1x / week    OT Duration 12 weeks    OT Treatment/Interventions Self-care/ADL training;Therapeutic  exercise;DME and/or AE instruction;Neuromuscular education;Manual Therapy;Passive range of motion;Therapeutic activities;Patient/family education    Consulted and Agree with Plan of Care Patient             Patient will benefit from skilled therapeutic intervention in order to improve the following deficits and impairments:   Body Structure / Function / Physical Skills: ADL, Coordination, GMC, UE functional use, Sensation, Body mechanics, Flexibility, IADL, Pain, Dexterity, FMC, Strength, ROM       Visit Diagnosis: Other lack of coordination  Muscle weakness (generalized)    Problem List Patient Active Problem List   Diagnosis Date Noted   History of CVA (cerebrovascular accident) 03/14/2021   Aortic atherosclerosis ([place]) 12/12/2020   Anemia due to vitamin B12 deficiency    Acute CVA (cerebrovascular accident) ([place])    Anemia of chronic disease    Urinary retention    Focal seizure ([place]) 11/20/2020   Thrombocytopenia ([place]) 11/20/2020   ESRD (end stage renal disease) ([place]) 11/20/2020   Hyperkalemia 11/20/2020   Leukocytosis 11/20/2020   ESRD on peritoneal dialysis ([place]) 11/20/2020   Status post shoulder replacement    Rotator cuff arthropathy 11/19/2020   Tobacco abuse 11/07/2020   Hypertensive heart and chronic kidney disease with heart failure and with stage 5 chronic kidney disease, or end stage renal disease ([place]) 07/10/2020   Dialysis patient ([place]) 01/09/2020   Senile purpura ([place]) 01/09/2020   Centrilobular emphysema ([place]) 09/13/2019   Secondary hyperparathyroidism ([place]) 03/14/2019   BPH (benign prostatic  hyperplasia) 10/24/2018   Depression 10/24/2018   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   Anemia of chronic kidney failure, stage 5 (HCC) 12/30/2017   Proteinuria 11/25/2017   Gout 08/27/2017   Primary myelofibrosis (HCC)    Major depression, recurrent (HCC)    Benign hypertensive renal disease    CKD (chronic kidney disease) stage 5, GFR  less than 15 ml/min (HCC)    Hyperlipidemia LDL goal <70 10/23/2002   CAD (coronary artery disease) 08/22/1996   Leta Speller, MS, OTR/L  Darleene Cleaver 05/13/2021, 10:12 AM  [place] MAIN [facility] SERVICES [address] [place], [place], [address] Phone: [phone]   Fax:  [phone]  Name: AFNAN EMBERTON MRN: [id number] Date of Birth: July 11, 1956

## 2021-05-14 DIAGNOSIS — N186 End stage renal disease: Secondary | ICD-10-CM | POA: Diagnosis not present

## 2021-05-14 DIAGNOSIS — Z992 Dependence on renal dialysis: Secondary | ICD-10-CM | POA: Diagnosis not present

## 2021-05-15 DIAGNOSIS — N186 End stage renal disease: Secondary | ICD-10-CM | POA: Diagnosis not present

## 2021-05-15 DIAGNOSIS — Z992 Dependence on renal dialysis: Secondary | ICD-10-CM | POA: Diagnosis not present

## 2021-05-16 DIAGNOSIS — N186 End stage renal disease: Secondary | ICD-10-CM | POA: Diagnosis not present

## 2021-05-16 DIAGNOSIS — Z992 Dependence on renal dialysis: Secondary | ICD-10-CM | POA: Diagnosis not present

## 2021-05-17 DIAGNOSIS — Z992 Dependence on renal dialysis: Secondary | ICD-10-CM | POA: Diagnosis not present

## 2021-05-17 DIAGNOSIS — N186 End stage renal disease: Secondary | ICD-10-CM | POA: Diagnosis not present

## 2021-05-19 DIAGNOSIS — Z992 Dependence on renal dialysis: Secondary | ICD-10-CM | POA: Diagnosis not present

## 2021-05-19 DIAGNOSIS — N186 End stage renal disease: Secondary | ICD-10-CM | POA: Diagnosis not present

## 2021-05-20 ENCOUNTER — Ambulatory Visit: Payer: Medicare HMO

## 2021-05-20 ENCOUNTER — Other Ambulatory Visit: Payer: Self-pay

## 2021-05-20 DIAGNOSIS — R278 Other lack of coordination: Secondary | ICD-10-CM

## 2021-05-20 DIAGNOSIS — M6281 Muscle weakness (generalized): Secondary | ICD-10-CM

## 2021-05-20 DIAGNOSIS — N186 End stage renal disease: Secondary | ICD-10-CM | POA: Diagnosis not present

## 2021-05-20 DIAGNOSIS — Z992 Dependence on renal dialysis: Secondary | ICD-10-CM | POA: Diagnosis not present

## 2021-05-20 NOTE — Therapy (Signed)
[place] MAIN [facility] SERVICES [address] [place], [place], [address] Phone: [phone]   Fax:  [phone]  Occupational Therapy Treatment  Patient Details  Name: James Arnold MRN: [id number] Date of Birth: 1956/04/21 Referring Provider (OT): Dr. Manuella Ghazi   Encounter Date: 05/20/2021   OT End of Session - 05/20/21 1107     Visit Number 8    Number of Visits 12    Date for OT Re-Evaluation 06/12/21    Authorization Type Reporting period beginning 03/21/2021    OT Start Time 1012    OT Stop Time 1058    OT Time Calculation (min) 46 min    Activity Tolerance Patient tolerated treatment well    Behavior During Therapy [facility] for tasks assessed/performed             Past Medical History:  Diagnosis Date   Anemia    Benign hypertensive renal disease    CAD (coronary artery disease)    a. 08/1996 s/p BMS to the mLAD ([facility]); b. 12/2017 MV: EF 46%, fixed apical defect w/ significant GI uptake artifact. No ischemia-> low risk.   CKD (chronic kidney disease), stage V ([place])    on peritoneal dialysis   Complication of anesthesia    please call by "[name]" when waking up!!   COPD (chronic obstructive pulmonary disease) (HCC)    centrilobular emphysema. pt is poorly controlled with his copd/smoking.   Depression    Diastolic dysfunction    a. 12/2017 Echo: EF 60-65%, no rwma, Gr1 DD, mild AI/MR. Nl RVSP.   Dyspnea    with exertion   Fatigue    FSGS (focal segmental glomerulosclerosis)    Hyperlipidemia 10/2002   Hypertension    myelofibrosis    bone marrow failure   Myelofibrosis ([place]) 2010   JAK2 (+) primary   Myocardial infarction ([place]) 1997   stent x 1   Smoker    Thrombocytopenia ([place])     Past Surgical History:  Procedure Laterality Date   ANGIOPLASTY     BONE MARROW ASPIRATION  03/2009   CARDIAC CATHETERIZATION  1997   stent x 1   COLONOSCOPY WITH PROPOFOL N/A 08/09/2018   Procedure: COLONOSCOPY WITH PROPOFOL;  Surgeon: [name], MD;  Location: [facility] ENDOSCOPY;  Service: Gastroenterology;  Laterality: N/A;   CORONARY STENT PLACEMENT  08/1996   EXTERIORIZATION OF A CONTINUOUS AMBULATORY PERITONEAL DIALYSIS CATHETER     EYE SURGERY Bilateral    cats removed   REVERSE SHOULDER ARTHROPLASTY Left 11/19/2020   Procedure: Left reverse shoulder arthroplasty;  Surgeon: Leim Fabry, MD;  Location: [facility] ORS;  Service: Orthopedics;  Laterality: Left;    There were no vitals filed for this visit.   Subjective Assessment - 05/20/21 1105     Subjective  "My hand isn't shaking as bad."    Pertinent History L reverse TSA on 11/19/2020    Patient Stated Goals "I want to use my arm better."    Currently in Pain? Yes    Pain Score 2     Pain Location Shoulder    Pain Orientation Left    Pain Descriptors / Indicators Sore    Pain Type Chronic pain    Pain Onset More than a month ago    Pain Frequency Intermittent    Aggravating Factors  reaching with LUE, carrying/picking up things with LUE    Pain Relieving Factors rest    Multiple Pain Sites No  Occupational Therapy Treatment: Neuro re-ed: Participation in L grip and pinch strengthening.  Used hand gripper with moderate resistance to complete 6 sets 10 reps of L hand squeezes, alternated sets with [facility] and pinch strengthening.  Worked lateral and 3 point pinch with all therapy resistant clothespins on horizontal target x3 trials.  Performed thin peg manipulation activities with Jamar dexterity pegs and pegboard, including picking up pegs from dish, table top, with and without tweezers, rotating pegs 180 degrees within thumb and fingertips, moving pegs from 1 row to another with vertical and horizontal tweezer position.    Response to Treatment: Pt making steady progress towards OT goals.  Pt reports overall less shaking of hand during functional tasks.  Pt improving with ability to pick up small objects from table top.  Overall less dropping of small items,  but still struggles to reach/place small objects at a target d/t distal unsteadiness/hand trembling.  Pt wants to retest objective measures next week for potential discharge as he is feeling confident with his functional use of L hand.  OT encouraged continued grip strengthening activities at home with putty on a daily basis and will plan to remeasure for potential discharge next visit pending status next week.  Pt in agreement with plan.       OT Education - 05/20/21 1107     Education Details HEP    Person(s) Educated Patient    Methods Explanation;Verbal cues    Comprehension Verbalized understanding;Returned demonstration              OT Short Term Goals - 03/21/21 1727       OT SHORT TERM GOAL #1   Title Pt will be indep with HEP for improving strength and coordination in LUE for improved ADL performance.    Baseline Eval: issued theraputty and instructed in grasping and pinching exercises.  Will continue to progress HEP with additional visits.    Time 6    Period Weeks    Status New    Target Date 05/01/21               OT Long Term Goals - 03/21/21 1728       OT LONG TERM GOAL #1   Title Pt will be able to clip his fingernails independently.    Baseline Eval: Difficulty clipping R hand fingernails d/t decreased coordination to L hand.    Time 12    Period Weeks    Status New    Target Date 06/12/21      OT LONG TERM GOAL #2   Title Pt will be able to manage his dialysis supplies and set up efficiently and independently.    Baseline Eval: extra time required d/t decreased coordination in L hand.    Time 12    Period Weeks    Status New    Target Date 06/12/21      OT LONG TERM GOAL #3   Title Pt will be able to confidently hold his coffee cup with L hand without fear of dropping cup.    Baseline Eval: unable to hold coffee cup in L hand.    Time 12    Period Weeks    Status New    Target Date 06/12/21      OT LONG TERM GOAL #4   Title Pt will  improve FOTO score to 59 or better to indicate increased functional performance .    Baseline Eval: 57    Time 12  Period Weeks    Status New    Target Date 06/12/21                   Plan - 05/20/21 1121     Clinical Impression Statement Pt making steady progress towards OT goals.  Pt reports overall less shaking of hand during functional tasks.  Pt improving with ability to pick up small objects from table top.  Overall less dropping of small items, but still struggles to reach/place small objects at a target d/t distal unsteadiness/hand trembling.  Pt wants to retest objective measures next week for potential discharge as he is feeling confident with his functional use of L hand.  OT encouraged continued grip strengthening activities at home with putty on a daily basis and will plan to remeasure for potential discharge next visit pending status next week.  Pt in agreement with plan.    OT Occupational Profile and History Detailed Assessment- Review of Records and additional review of physical, cognitive, psychosocial history related to current functional performance    Occupational performance deficits (Please refer to evaluation for details): ADL's;IADL's;Leisure    Body Structure / Function / Physical Skills ADL;Coordination;GMC;UE functional use;Sensation;Body mechanics;Flexibility;IADL;Pain;Dexterity;FMC;Strength;ROM    Rehab Potential Good    Clinical Decision Making Several treatment options, min-mod task modification necessary    Comorbidities Affecting Occupational Performance: May have comorbidities impacting occupational performance    Modification or Assistance to Complete Evaluation  No modification of tasks or assist necessary to complete eval    OT Frequency 1x / week    OT Duration 12 weeks    OT Treatment/Interventions Self-care/ADL training;Therapeutic exercise;DME and/or AE instruction;Neuromuscular education;Manual Therapy;Passive range of motion;Therapeutic  activities;Patient/family education    Consulted and Agree with Plan of Care Patient             Patient will benefit from skilled therapeutic intervention in order to improve the following deficits and impairments:   Body Structure / Function / Physical Skills: ADL, Coordination, GMC, UE functional use, Sensation, Body mechanics, Flexibility, IADL, Pain, Dexterity, FMC, Strength, ROM       Visit Diagnosis: Other lack of coordination  Muscle weakness (generalized)    Problem List Patient Active Problem List   Diagnosis Date Noted   History of CVA (cerebrovascular accident) 03/14/2021   Aortic atherosclerosis ([place]) 12/12/2020   Anemia due to vitamin B12 deficiency    Acute CVA (cerebrovascular accident) ([place])    Anemia of chronic disease    Urinary retention    Focal seizure ([place]) 11/20/2020   Thrombocytopenia ([place]) 11/20/2020   ESRD (end stage renal disease) ([place]) 11/20/2020   Hyperkalemia 11/20/2020   Leukocytosis 11/20/2020   ESRD on peritoneal dialysis ([place]) 11/20/2020   Status post shoulder replacement    Rotator cuff arthropathy 11/19/2020   Tobacco abuse 11/07/2020   Hypertensive heart and chronic kidney disease with heart failure and with stage 5 chronic kidney disease, or end stage renal disease ([place]) 07/10/2020   Dialysis patient ([place]) 01/09/2020   Senile purpura ([place]) 01/09/2020   Centrilobular emphysema ([place]) 09/13/2019   Secondary hyperparathyroidism ([place]) 03/14/2019   BPH (benign prostatic hyperplasia) 10/24/2018   Depression 10/24/2018   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   Anemia of chronic kidney failure, stage 5 ([place]) 12/30/2017   Proteinuria 11/25/2017   Gout 08/27/2017   Primary myelofibrosis (HCC)    Major depression, recurrent (HCC)    Benign hypertensive renal disease    CKD (chronic kidney disease) stage 5, GFR less than  15 ml/min ([place])    Hyperlipidemia LDL goal <70 10/23/2002   CAD (coronary artery disease) 08/22/1996    Leta Speller, MS, OTR/L  Darleene Cleaver 05/20/2021, 11:21 AM  [place] MAIN [facility] SERVICES [address] [place], [place], [address] Phone: [phone]   Fax:  [phone]  Name: James Arnold MRN: [id number] Date of Birth: 1956-09-08

## 2021-05-21 DIAGNOSIS — Z992 Dependence on renal dialysis: Secondary | ICD-10-CM | POA: Diagnosis not present

## 2021-05-21 DIAGNOSIS — N186 End stage renal disease: Secondary | ICD-10-CM | POA: Diagnosis not present

## 2021-05-22 DIAGNOSIS — N186 End stage renal disease: Secondary | ICD-10-CM | POA: Diagnosis not present

## 2021-05-22 DIAGNOSIS — Z992 Dependence on renal dialysis: Secondary | ICD-10-CM | POA: Diagnosis not present

## 2021-05-23 DIAGNOSIS — Z992 Dependence on renal dialysis: Secondary | ICD-10-CM | POA: Diagnosis not present

## 2021-05-23 DIAGNOSIS — N186 End stage renal disease: Secondary | ICD-10-CM | POA: Diagnosis not present

## 2021-05-24 DIAGNOSIS — N186 End stage renal disease: Secondary | ICD-10-CM | POA: Diagnosis not present

## 2021-05-24 DIAGNOSIS — Z992 Dependence on renal dialysis: Secondary | ICD-10-CM | POA: Diagnosis not present

## 2021-05-26 DIAGNOSIS — Z992 Dependence on renal dialysis: Secondary | ICD-10-CM | POA: Diagnosis not present

## 2021-05-26 DIAGNOSIS — N186 End stage renal disease: Secondary | ICD-10-CM | POA: Diagnosis not present

## 2021-05-27 DIAGNOSIS — N186 End stage renal disease: Secondary | ICD-10-CM | POA: Diagnosis not present

## 2021-05-27 DIAGNOSIS — Z992 Dependence on renal dialysis: Secondary | ICD-10-CM | POA: Diagnosis not present

## 2021-05-28 ENCOUNTER — Ambulatory Visit: Payer: Medicare HMO | Attending: Neurology

## 2021-05-28 ENCOUNTER — Other Ambulatory Visit: Payer: Self-pay

## 2021-05-28 DIAGNOSIS — R278 Other lack of coordination: Secondary | ICD-10-CM | POA: Diagnosis not present

## 2021-05-28 DIAGNOSIS — N186 End stage renal disease: Secondary | ICD-10-CM | POA: Diagnosis not present

## 2021-05-28 DIAGNOSIS — Z992 Dependence on renal dialysis: Secondary | ICD-10-CM | POA: Diagnosis not present

## 2021-05-28 DIAGNOSIS — M6281 Muscle weakness (generalized): Secondary | ICD-10-CM | POA: Insufficient documentation

## 2021-05-28 NOTE — Therapy (Addendum)
[place] MAIN [facility] SERVICES [address] [place], [place], [address] Phone: [phone]   Fax:  [phone]  Occupational Therapy Treatment/Discharge Summary   Patient Details  Name: James Arnold MRN: [id number] Date of Birth: Apr 05, 1956 Referring Provider (OT): Dr. Manuella Ghazi   Encounter Date: 05/28/2021   OT End of Session - 05/28/21 2155     Visit Number 9    Number of Visits 12    Date for OT Re-Evaluation 06/12/21    Authorization Type Reporting period beginning 03/21/2021    OT Start Time 1015    OT Stop Time 1055    OT Time Calculation (min) 40 min    Activity Tolerance Patient tolerated treatment well    Behavior During Therapy [facility] for tasks assessed/performed             Past Medical History:  Diagnosis Date   Anemia    Benign hypertensive renal disease    CAD (coronary artery disease)    a. 08/1996 s/p BMS to the mLAD ([facility]); b. 12/2017 MV: EF 46%, fixed apical defect w/ significant GI uptake artifact. No ischemia-> low risk.   CKD (chronic kidney disease), stage V ([place])    on peritoneal dialysis   Complication of anesthesia    please call by "[name]" when waking up!!   COPD (chronic obstructive pulmonary disease) (HCC)    centrilobular emphysema. pt is poorly controlled with his copd/smoking.   Depression    Diastolic dysfunction    a. 12/2017 Echo: EF 60-65%, no rwma, Gr1 DD, mild AI/MR. Nl RVSP.   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   Myocardial infarction ([place]) 1997   stent x 1   Smoker    Thrombocytopenia ([place])     Past Surgical History:  Procedure Laterality Date   ANGIOPLASTY     BONE MARROW ASPIRATION  03/2009   CARDIAC CATHETERIZATION  1997   stent x 1   COLONOSCOPY WITH PROPOFOL N/A 08/09/2018   Procedure: COLONOSCOPY WITH  PROPOFOL;  Surgeon: [name], MD;  Location: [facility] ENDOSCOPY;  Service: Gastroenterology;  Laterality: N/A;   CORONARY STENT PLACEMENT  08/1996   EXTERIORIZATION OF A CONTINUOUS AMBULATORY PERITONEAL DIALYSIS CATHETER     EYE SURGERY Bilateral    cats removed   REVERSE SHOULDER ARTHROPLASTY Left 11/19/2020   Procedure: Left reverse shoulder arthroplasty;  Surgeon: Leim Fabry, MD;  Location: [facility] ORS;  Service: Orthopedics;  Laterality: Left;    There were no vitals filed for this visit.   Subjective Assessment - 05/28/21 2153     Subjective  "I'm steering my lawn mower now with both hands."    Pertinent History L reverse TSA on 11/19/2020    Patient Stated Goals "I want to use my arm better."    Currently in Pain? Yes    Pain Score 2     Pain Location Shoulder    Pain Orientation Left    Pain Descriptors / Indicators Sore;Tightness    Pain Type Chronic pain    Pain Radiating Towards upper arm    Pain Onset More than a month ago    Pain Frequency Intermittent    Aggravating Factors  reaching above shoulder level with LUE    Pain Relieving Factors rest  Multiple Pain Sites No                [facility] OT Assessment - 05/28/21 0001       Assessment   Medical Diagnosis CVA      Observation/Other Assessments   Focus on Therapeutic Outcomes (FOTO)  72      Sensation   Light Touch Impaired by gross assessment   pt reports numbness over L shoulder post L reverse TSA   Additional Comments still some mild tingling throughout LUE      Coordination   Right 9 Hole Peg Test 22 secs    Left 9 Hole Peg Test 38 sec    Coordination Pt reports L hand shakes much less during functional activity      Hand Function   Right Hand Grip (lbs) 58    Right Hand Lateral Pinch 15 lbs    Right Hand 3 Point Pinch 16 lbs    Left Hand Grip (lbs) 33    Left Hand Lateral Pinch 12 lbs    Left 3 point pinch 12 lbs            Occupational Therapy Treatment/Discharge Summary: Neuro  Re-ed: Used hand gripper x3 trials set at 17.9 lbs to place/remove jumbo pegs from peg board using L hand.  2 rest breaks needed on 3rd trial only d/t hand fatigue with sustained gripping.  Facilitated digit isolation with grasping, storage, and translation of jumbo pegs from 1 row to another in pegboard, and 180 rotation of 1 peg in hand, cueing to minimize forearm movements to facilitate digit isolation.  Improved speed and fluidity of movement with above noted peg tasks. Issued strong blue theraputty to further progress L grip and pinch strength from original light resistance pink putty.  Pt returned demo of gripping and pinching exercises.   Response to Treatment: Pt has consistently participated in outpatient OT visits over the course of 9 sessions from 03/21/2021-05/28/2021.  Post Cva, pt was using RUE only for self care d/t weakness and lack of coordination throughout LUE.  At discharge, pt is able to perform all ADL/IADL tasks with good use of LUE, L hand grip/pinch/dexterity objective measures have all improved, see above.  Pt reports significant improvement in the shaking in his L hand which he was experiencing d/t moderate to severe ataxia post CVA, now very minimal and mostly when hand is fatigued.  Pt is indep with his HEP and feels ready to discharge OT d/t goals met and satisfaction with use of his L hand.        OT Education - 05/28/21 2155     Education Details HEP    Person(s) Educated Patient    Methods Explanation;Verbal cues    Comprehension Verbalized understanding;Returned demonstration              OT Short Term Goals - 05/28/21 2157       OT SHORT TERM GOAL #1   Title Pt will be indep with HEP for improving strength and coordination in LUE for improved ADL performance.    Baseline Eval: issued theraputty and instructed in grasping and pinching exercises.  Will continue to progress HEP with additional visits; 05/28/2021: indep with HEP    Time 6    Period Weeks     Status Achieved    Target Date 05/01/21               OT Long Term Goals - 05/28/21 2158  OT LONG TERM GOAL #1   Title Pt will be able to clip his fingernails independently.    Baseline Eval: Difficulty clipping R hand fingernails d/t decreased coordination to L hand; 05/28/2021: indep to clip R/L hand fingernails.    Time 12    Period Weeks    Status Achieved    Target Date 06/12/21      OT LONG TERM GOAL #2   Title Pt will be able to manage his dialysis supplies and set up efficiently and independently.    Baseline Eval: extra time required d/t decreased coordination in L hand; 05/28/2021: pt is indep to set up dialysis supplies efficiently/reports no difficulty    Time 12    Period Weeks    Status Achieved    Target Date 05/28/21      OT LONG TERM GOAL #3   Title Pt will be able to confidently hold his coffee cup with L hand without fear of dropping cup.    Baseline Eval: unable to hold coffee cup in L hand; 05/28/2021: Able to hold coffee cup in L hand to pour pot of coffee with R hand, uses caution to ensure no dropping from L hand.    Time 12    Period Weeks    Status Achieved    Target Date 05/28/21      OT LONG TERM GOAL #4   Title Pt will improve FOTO score to 59 or better to indicate increased functional performance .    Baseline Eval: 57; 05/28/2021: FOTO 72    Time 12    Period Weeks    Status Achieved    Target Date 06/12/21                   Plan - 05/28/21 2224     Clinical Impression Statement Pt has consistently participated in outpatient OT visits over the course of 9 sessions from 03/21/2021-05/28/2021.  Post Cva, pt was using RUE only for self care d/t weakness and lack of coordination throughout LUE.  At discharge, pt is able to perform all ADL/IADL tasks with good use of LUE.  Pt reports significant improvement in the shaking in his L hand which he was experiencing d/t moderate to severe ataxia post CVA, now very minimal and mostly when hand  is fatigued.  Pt is indep with his HEP and feels ready to discharge OT d/t goals met and satisfaction with use of his L hand.    OT Occupational Profile and History Detailed Assessment- Review of Records and additional review of physical, cognitive, psychosocial history related to current functional performance    Occupational performance deficits (Please refer to evaluation for details): ADL's;IADL's;Leisure    Body Structure / Function / Physical Skills ADL;Coordination;GMC;UE functional use;Sensation;Body mechanics;Flexibility;IADL;Pain;Dexterity;FMC;Strength;ROM    Rehab Potential Good    Clinical Decision Making Several treatment options, min-mod task modification necessary    Comorbidities Affecting Occupational Performance: May have comorbidities impacting occupational performance    Modification or Assistance to Complete Evaluation  No modification of tasks or assist necessary to complete eval    OT Frequency 1x / week    OT Duration 12 weeks    OT Treatment/Interventions Self-care/ADL training;Therapeutic exercise;DME and/or AE instruction;Neuromuscular education;Manual Therapy;Passive range of motion;Therapeutic activities;Patient/family education    Consulted and Agree with Plan of Care Patient             Patient will benefit from skilled therapeutic intervention in order to improve the following deficits and impairments:  Body Structure / Function / Physical Skills: ADL, Coordination, GMC, UE functional use, Sensation, Body mechanics, Flexibility, IADL, Pain, Dexterity, FMC, Strength, ROM       Visit Diagnosis: Other lack of coordination  Muscle weakness (generalized)    Problem List Patient Active Problem List   Diagnosis Date Noted   History of CVA (cerebrovascular accident) 03/14/2021   Aortic atherosclerosis ([place]) 12/12/2020   Anemia due to vitamin B12 deficiency    Acute CVA (cerebrovascular accident) ([place])    Anemia of chronic disease    Urinary retention     Focal seizure ([place]) 11/20/2020   Thrombocytopenia ([place]) 11/20/2020   ESRD (end stage renal disease) ([place]) 11/20/2020   Hyperkalemia 11/20/2020   Leukocytosis 11/20/2020   ESRD on peritoneal dialysis ([place]) 11/20/2020   Status post shoulder replacement    Rotator cuff arthropathy 11/19/2020   Tobacco abuse 11/07/2020   Hypertensive heart and chronic kidney disease with heart failure and with stage 5 chronic kidney disease, or end stage renal disease ([place]) 07/10/2020   Dialysis patient ([place]) 01/09/2020   Senile purpura ([place]) 01/09/2020   Centrilobular emphysema ([place]) 09/13/2019   Secondary hyperparathyroidism ([place]) 03/14/2019   BPH (benign prostatic hyperplasia) 10/24/2018   Depression 10/24/2018   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   Anemia of chronic kidney failure, stage 5 ([place]) 12/30/2017   Proteinuria 11/25/2017   Gout 08/27/2017   Primary myelofibrosis (HCC)    Major depression, recurrent (HCC)    Benign hypertensive renal disease    CKD (chronic kidney disease) stage 5, GFR less than 15 ml/min (HCC)    Hyperlipidemia LDL goal <70 10/23/2002   CAD (coronary artery disease) 08/22/1996   Leta Speller, MS, OTR/L  Darleene Cleaver, OT 05/28/2021, 10:29 PM  [place] MAIN [facility] SERVICES [address] [place], [place], [address] Phone: [phone]   Fax:  [phone]  Name: James Arnold MRN: [id number] Date of Birth: 11/11/1955

## 2021-05-29 ENCOUNTER — Inpatient Hospital Stay: Payer: Medicare HMO | Attending: Internal Medicine

## 2021-05-29 ENCOUNTER — Inpatient Hospital Stay (HOSPITAL_BASED_OUTPATIENT_CLINIC_OR_DEPARTMENT_OTHER): Payer: Medicare HMO | Admitting: Internal Medicine

## 2021-05-29 ENCOUNTER — Inpatient Hospital Stay: Payer: Medicare HMO

## 2021-05-29 VITALS — BP 153/73 | HR 73 | Temp 96.1°F | Resp 16 | Wt 156.6 lb

## 2021-05-29 DIAGNOSIS — J449 Chronic obstructive pulmonary disease, unspecified: Secondary | ICD-10-CM | POA: Diagnosis not present

## 2021-05-29 DIAGNOSIS — J3489 Other specified disorders of nose and nasal sinuses: Secondary | ICD-10-CM | POA: Diagnosis not present

## 2021-05-29 DIAGNOSIS — Z7952 Long term (current) use of systemic steroids: Secondary | ICD-10-CM | POA: Insufficient documentation

## 2021-05-29 DIAGNOSIS — C44310 Basal cell carcinoma of skin of unspecified parts of face: Secondary | ICD-10-CM

## 2021-05-29 DIAGNOSIS — R161 Splenomegaly, not elsewhere classified: Secondary | ICD-10-CM | POA: Diagnosis not present

## 2021-05-29 DIAGNOSIS — D471 Chronic myeloproliferative disease: Secondary | ICD-10-CM

## 2021-05-29 DIAGNOSIS — D649 Anemia, unspecified: Secondary | ICD-10-CM | POA: Insufficient documentation

## 2021-05-29 DIAGNOSIS — N186 End stage renal disease: Secondary | ICD-10-CM | POA: Insufficient documentation

## 2021-05-29 DIAGNOSIS — Z992 Dependence on renal dialysis: Secondary | ICD-10-CM | POA: Diagnosis not present

## 2021-05-29 LAB — CBC WITH DIFFERENTIAL/PLATELET
Abs Immature Granulocytes: 0.03 10*3/uL (ref 0.00–0.07)
Basophils Absolute: 0 10*3/uL (ref 0.0–0.1)
Basophils Relative: 0 %
Eosinophils Absolute: 0 10*3/uL (ref 0.0–0.5)
Eosinophils Relative: 0 %
HCT: 33.7 % — ABNORMAL LOW (ref 39.0–52.0)
Hemoglobin: 10.6 g/dL — ABNORMAL LOW (ref 13.0–17.0)
Immature Granulocytes: 1 %
Lymphocytes Relative: 7 %
Lymphs Abs: 0.4 10*3/uL — ABNORMAL LOW (ref 0.7–4.0)
MCH: 29.4 pg (ref 26.0–34.0)
MCHC: 31.5 g/dL (ref 30.0–36.0)
MCV: 93.4 fL (ref 80.0–100.0)
Monocytes Absolute: 0.2 10*3/uL (ref 0.1–1.0)
Monocytes Relative: 4 %
Neutro Abs: 4.4 10*3/uL (ref 1.7–7.7)
Neutrophils Relative %: 88 %
Platelets: 123 10*3/uL — ABNORMAL LOW (ref 150–400)
RBC: 3.61 MIL/uL — ABNORMAL LOW (ref 4.22–5.81)
RDW: 14.2 % (ref 11.5–15.5)
WBC: 5.1 10*3/uL (ref 4.0–10.5)
nRBC: 0 % (ref 0.0–0.2)

## 2021-05-29 LAB — COMPREHENSIVE METABOLIC PANEL
ALT: 15 U/L (ref 0–44)
AST: 13 U/L — ABNORMAL LOW (ref 15–41)
Albumin: 3.1 g/dL — ABNORMAL LOW (ref 3.5–5.0)
Alkaline Phosphatase: 45 U/L (ref 38–126)
Anion gap: 7 (ref 5–15)
BUN: 52 mg/dL — ABNORMAL HIGH (ref 8–23)
CO2: 25 mmol/L (ref 22–32)
Calcium: 7.4 mg/dL — ABNORMAL LOW (ref 8.9–10.3)
Chloride: 104 mmol/L (ref 98–111)
Creatinine, Ser: 6.59 mg/dL — ABNORMAL HIGH (ref 0.61–1.24)
GFR, Estimated: 9 mL/min — ABNORMAL LOW (ref >60)
Glucose, Bld: 91 mg/dL (ref 70–99)
Potassium: 4.8 mmol/L (ref 3.5–5.1)
Sodium: 136 mmol/L (ref 135–145)
Total Bilirubin: 0.4 mg/dL (ref 0.3–1.2)
Total Protein: 5.7 g/dL — ABNORMAL LOW (ref 6.5–8.1)

## 2021-05-29 LAB — LACTATE DEHYDROGENASE: LDH: 566 U/L — ABNORMAL HIGH (ref 98–192)

## 2021-05-29 LAB — SAMPLE TO BLOOD BANK

## 2021-05-29 NOTE — Progress Notes (Signed)
[place] OFFICE PROGRESS NOTE  Patient Care Team: Valerie Roys, DO as PCP - General (Family Medicine) End, Harrell Gave, MD as PCP - Cardiology (Cardiology) Cammie Sickle, MD as Consulting Physician (Internal Medicine) Dasher, Rayvon Char, MD as Consulting Physician (Dermatology)  Cancer Staging No matching staging information was found for the patient.   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   # AUGUST 1st week 2019- Retacrit   # October 2nd 2019- jakafi 10 BID [? Renal involvement]; September 2020-peritoneal dialysis; Jakafi 10 mg in the morning; STOPPED/tapered [id number], 2021 [sec intolerance fatigue/myalgias]  # May 4th 2022- START [name] dosing/PD]; STOPPED after2-3 weeks-because of poor tolerance [sever fatigue/? 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 [name]- kidney Bx- FSG [ on Prednisone Dr.[name]]; July 2020-peritoneal dialysis;   # hx of Lung nodules- resolved [[name]]/quit smoking.  DIAGNOSIS: PRIMARY MYELOFIBROSIS  RISK:LOW     ;GOALS: Control  CURRENT/MOST RECENT THERAPY : Surveillance    Primary myelofibrosis ([place])      INTERVAL HISTORY:Ambulating independently.  He is alone.   James Arnold 65 y.o.  male pleasant patient above history of primary myelofibrosis Jak 2+ currently off [place] surveillance chronic kidney disease on peritoneal dialysis is here for follow-up.  Patient denies any recent hospitalizations.  Patient's appetite is good.  Is not losing weight.  No night sweats.  No fevers.  No chills.  Denies any early satiety.   Review of Systems  Constitutional:  Positive for malaise/fatigue and weight  loss. Negative for chills, diaphoresis and fever.  HENT:  Negative for nosebleeds and sore throat.   Eyes:  Negative for double vision.  Respiratory:  Positive for cough and shortness of breath. Negative for hemoptysis, sputum production and wheezing.   Cardiovascular:  Negative for chest pain, palpitations and orthopnea.  Gastrointestinal:  Negative for abdominal pain, blood in stool, constipation, diarrhea, heartburn, melena, nausea and vomiting.  Genitourinary:  Negative for dysuria, frequency and urgency.  Musculoskeletal:  Positive for back pain and joint pain.  Skin: Negative.  Negative for itching and rash.  Neurological:  Positive for dizziness and tingling. Negative for focal weakness, weakness and headaches.  Endo/Heme/Allergies:  Does not bruise/bleed easily.  Psychiatric/Behavioral:  Negative for depression. The patient is not nervous/anxious and does not have insomnia.      PAST MEDICAL HISTORY :  Past Medical History:  Diagnosis Date  . Anemia   . Benign hypertensive renal disease   . CAD (coronary artery disease)    a. 08/1996 s/p BMS to the mLAD ([facility]); b. 12/2017 MV: EF 46%, fixed apical defect w/ significant GI uptake artifact. No ischemia-> low risk.  . CKD (chronic kidney disease), stage V ([place])    on peritoneal dialysis  . Complication of anesthesia    please call by "[name]" when waking up!!  . COPD (chronic obstructive pulmonary disease) ([place])    centrilobular emphysema. pt is poorly controlled with his copd/smoking.  . Depression   . Diastolic dysfunction    a. 12/2017 Echo: EF 60-65%, no rwma, Gr1 DD, mild AI/MR. Nl RVSP.  [name] Kitchen Dyspnea    with exertion  . Fatigue   . FSGS (focal segmental glomerulosclerosis)   . Hyperlipidemia 10/2002  .  Hypertension   . myelofibrosis    bone marrow failure  . 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 Myocardial infarction ([place]) 1997   stent x 1  . Smoker   . Thrombocytopenia ([place])     PAST SURGICAL HISTORY :    Past Surgical History:  Procedure Laterality Date  . ANGIOPLASTY    . BONE MARROW ASPIRATION  03/2009  . CARDIAC CATHETERIZATION  1997   stent x 1  . COLONOSCOPY WITH PROPOFOL N/A 08/09/2018   Procedure: COLONOSCOPY WITH PROPOFOL;  Surgeon: [name], MD;  Location: [facility] ENDOSCOPY;  Service: Gastroenterology;  Laterality: N/A;  . CORONARY STENT PLACEMENT  08/1996  . EXTERIORIZATION OF A CONTINUOUS AMBULATORY PERITONEAL DIALYSIS CATHETER    . EYE SURGERY Bilateral    cats removed  . REVERSE SHOULDER ARTHROPLASTY Left 11/19/2020   Procedure: Left reverse shoulder arthroplasty;  Surgeon: Leim Fabry, MD;  Location: [facility] ORS;  Service: Orthopedics;  Laterality: Left;    FAMILY HISTORY :   Family History  Problem Relation Age of Onset  . Stroke Mother        Low BP stroke  . Depression Father   . Depression Sister        Breast  . Heart disease Other   . Breast cancer Other   . Lung cancer Other   . Ovarian cancer Other   . Stomach cancer Other     SOCIAL HISTORY:   Social History   Tobacco Use  . Smoking status: Every Day    Packs/day: 0.75    Years: 47.00    Pack years: 35.25    Types: Cigarettes  . Smokeless tobacco: Never  . Tobacco comments:    trying to cut back-smokes 6 to 7 cigs per day  Vaping Use  . Vaping Use: Never used  Substance Use Topics  . Alcohol use: Yes    Comment: "6 beers weekly on Saturdays"  . Drug use: No    ALLERGIES:  has No Known Allergies.  MEDICATIONS:  Current Outpatient Medications  Medication Sig Dispense Refill  . albuterol (VENTOLIN HFA) 108 (90 Base) MCG/ACT inhaler Inhale 2 puffs into the lungs every 6 (six) hours as needed for wheezing or shortness of breath. 8 g 2  . aspirin 81 MG chewable tablet Chew 1 tablet (81 mg total) by mouth daily. 90 tablet 1  . carvedilol (COREG) 12.5 MG tablet Take 1.5 tablets (18.75 mg total) by mouth 2 (two) times daily. 270 tablet 3  . Cholecalciferol (VITAMIN D-1000 MAX ST) 25 MCG (1000 UT)  tablet Take 1,000 Units by mouth daily.     . citalopram (CELEXA) 40 MG tablet Take 1 tablet (40 mg total) by mouth daily. 90 tablet 1  . epoetin alfa-epbx (RETACRIT) 32355 UNIT/ML injection 10,000 Units every 30 (thirty) days. At dialysis    . furosemide (LASIX) 20 MG tablet Take 1 tablet (20 mg total) by mouth daily. 90 tablet 1  . gentamicin cream (GARAMYCIN) 0.1 % Apply 1 application topically every other day.    . levETIRAcetam (KEPPRA) 500 MG tablet Take 1 tablet (500 mg total) by mouth at bedtime. 30 tablet 0  . losartan (COZAAR) 50 MG tablet     . omeprazole (PRILOSEC) 20 MG capsule Take 1 capsule (20 mg total) by mouth daily. 90 capsule 1  . ondansetron (ZOFRAN ODT) 8 MG disintegrating tablet Take 1 tablet (8 mg total) by mouth every 8 (eight) hours as needed for nausea or vomiting.  30 tablet 1  . polyethylene glycol (MIRALAX / GLYCOLAX) 17 g packet Take 17 g by mouth daily as needed. 14 each 0  . predniSONE (DELTASONE) 10 MG tablet Take 10 mg by mouth daily with breakfast.    . rosuvastatin (CRESTOR) 5 MG tablet Take 1 tablet (5 mg total) by mouth every Monday, Wednesday, and Friday. 36 tablet 3  . ruxolitinib phosphate (JAKAFI) 15 MG tablet Take 1 pill Monday Wednesday Friday after dialysis. 12 tablet 4  . ferrous sulfate 325 (65 FE) MG tablet Take 1 tablet (325 mg total) by mouth daily with breakfast. 30 tablet 1   No current facility-administered medications for this visit.    PHYSICAL EXAMINATION: ECOG PERFORMANCE STATUS: 1 - Symptomatic but completely ambulatory  BP (!) 153/73   Pulse 73   Temp (!) 96.1 F (35.6 C) (Tympanic)   Resp 16   Wt 156 lb 9.6 oz (71 kg)   SpO2 99%   BMI 24.53 kg/m   Filed Weights   05/29/21 0957  Weight: 156 lb 9.6 oz (71 kg)    Physical Exam HENT:     Head: Normocephalic and atraumatic.     Mouth/Throat:     Pharynx: No oropharyngeal exudate.  Eyes:     Pupils: Pupils are equal, round, and reactive to light.  Cardiovascular:      Rate and Rhythm: Normal rate and regular rhythm.  Pulmonary:     Effort: No respiratory distress.     Breath sounds: No wheezing.     Comments: Decreased air entry bilaterally. Abdominal:     General: Bowel sounds are normal. There is no distension.     Palpations: Abdomen is soft. There is no mass.     Tenderness: There is no abdominal tenderness. There is no guarding or rebound.  Musculoskeletal:        General: No tenderness. Normal range of motion.     Cervical back: Normal range of motion and neck supple.  Skin:    General: Skin is warm.     Comments: Left nasal fold-half a centimeter skin lesion noted concerning for BCC.   Neurological:     Mental Status: He is alert and oriented to person, place, and time.     Comments: Left upper extremity weakness noted.    Psychiatric:        Mood and Affect: Affect normal.    LABORATORY DATA:  I have reviewed the data as listed    Component Value Date/Time   NA 136 05/29/2021 0851   NA 138 01/08/2021 0915   K 4.8 05/29/2021 0851   CL 104 05/29/2021 0851   CO2 25 05/29/2021 0851   GLUCOSE 91 05/29/2021 0851   BUN 52 (H) 05/29/2021 0851   BUN 63 (H) 01/08/2021 0915   CREATININE 6.59 (H) 05/29/2021 0851   CREATININE 1.12 09/30/2013 0953   CALCIUM 7.4 (L) 05/29/2021 0851   PROT 5.7 (L) 05/29/2021 0851   PROT 5.5 (L) 01/08/2021 0915   ALBUMIN 3.1 (L) 05/29/2021 0851   ALBUMIN 3.7 (L) 01/08/2021 0915   AST 13 (L) 05/29/2021 0851   ALT 15 05/29/2021 0851   ALKPHOS 45 05/29/2021 0851   BILITOT 0.4 05/29/2021 0851   BILITOT 0.3 01/08/2021 0915   GFRNONAA 9 (L) 05/29/2021 0851   GFRNONAA >60 09/30/2013 0953   GFRAA 14 (L) 07/10/2020 0909   GFRAA >60 09/30/2013 0953    No results found for: SPEP, UPEP  Lab Results  Component Value Date  WBC 5.1 05/29/2021   NEUTROABS 4.4 05/29/2021   HGB 10.6 (L) 05/29/2021   HCT 33.7 (L) 05/29/2021   MCV 93.4 05/29/2021   PLT 123 (L) 05/29/2021      Chemistry      Component Value  Date/Time   NA 136 05/29/2021 0851   NA 138 01/08/2021 0915   K 4.8 05/29/2021 0851   CL 104 05/29/2021 0851   CO2 25 05/29/2021 0851   BUN 52 (H) 05/29/2021 0851   BUN 63 (H) 01/08/2021 0915   CREATININE 6.59 (H) 05/29/2021 0851   CREATININE 1.12 09/30/2013 0953      Component Value Date/Time   CALCIUM 7.4 (L) 05/29/2021 0851   ALKPHOS 45 05/29/2021 0851   AST 13 (L) 05/29/2021 0851   ALT 15 05/29/2021 0851   BILITOT 0.4 05/29/2021 0851   BILITOT 0.3 01/08/2021 0915       RADIOGRAPHIC STUDIES: I have personally reviewed the radiological images as listed and agreed with the findings in the report. No results found.   ASSESSMENT & PLAN:  Primary myelofibrosis ([place]) # Primary Myelofiborisis [Bone marrow 2010] jak-2 POSITIVE;  February 2022-worsening splenomegaly/worsening fatigue. Patient discontinued Jakafi-because of poor tolerance.  # CT June 2022- [abdominal pain /pneumoperitoneum]- 820 cc; worsening-however patient is stable anemia stable; although LDH is trending up.  Could consider Fedratinib/Lenalidomide -however concerned about tolerance. Plan US spleen next visit.   #Anemia hemoglobin is 8-9. Hb today- 10.2.  Continue EPO stimulating agent /IV iron as per nephrology. STABLE.   #Stroke/left upper extremity weakness/seizures- [s/p shoulder surgery] STABLE;  on antiplatelet therapy.   #COPD-poorly controlled; declines PFTs; continue prednisone 10 mg a day; STABLE.   #End-stage renal disease [Dr. Latif]  on PD; STABLE.   #Left nasal fold lesion-question BCC; refer to [facility] dermatology.  # COVID vaccine: DECLINED Covid-19 vaccine.   # DISPOSITION:  # HOLD PRBC transfusion  # refer to [facility] re: left face ? BCC [call wife/debbie for appt]  # follow up in 1 months-labs- MD-CBC/CMP;LDH;HOLD tube; posssible1 unit PRBC tranfusion; [place] prior- Dr.B  Orders Placed This Encounter  Procedures  . US SPLEEN (ABDOMEN LIMITED)    Standing Status:   Future     Standing Expiration Date:   07/29/2021    Order Specific Question:   Reason for Exam (SYMPTOM  OR DIAGNOSIS REQUIRED)    Answer:   splenomegaly    Order Specific Question:   Preferred imaging location?    Answer:   [facility]  . CBC with Differential    Standing Status:   Future    Standing Expiration Date:   05/29/2022  . Comprehensive metabolic panel    Standing Status:   Future    Standing Expiration Date:   05/29/2022  . Lactate dehydrogenase    Standing Status:   Future    Standing Expiration Date:   05/29/2022  . Ambulatory referral to Dermatology    Referral Priority:   Routine    Referral Type:   Consultation    Referral Reason:   Specialty Services Required    Referred to Provider:   Dasher, Rayvon Char, MD    Requested Specialty:   Dermatology    Number of Visits Requested:   1  . Hold Tube- Blood Bank    Standing Status:   Future    Standing Expiration Date:   05/29/2022   All questions were answered. The patient knows to call the clinic with any problems, questions or concerns.  Cammie Sickle, MD 05/29/2021 1:54 PM

## 2021-05-29 NOTE — Assessment & Plan Note (Addendum)
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 Patient discontinued Jakafi-because of poor tolerance.  # CT June 2022- [abdominal pain /pneumoperitoneum]- 820 cc; worsening-however patient is stable anemia stable; although LDH is trending up.  Could consider Fedratinib/Lenalidomide -however concerned about tolerance. Plan US spleen next visit.   #Anemia hemoglobin is 8-9. Hb today- 10.2.  Continue EPO stimulating agent /IV iron as per nephrology. STABLE.   #Stroke/left upper extremity weakness/seizures- [s/p shoulder surgery] STABLE;  on antiplatelet therapy.  #COPD-poorly controlled; declines PFTs; continue prednisone 10 mg a day; STABLE.   #End-stage renal disease [Dr. Latif]  on PD; STABLE.   #Left nasal fold lesion-question BCC; refer to [facility] dermatology.  # COVID vaccine: DECLINED Covid-19 vaccine.   # DISPOSITION:  # HOLD PRBC transfusion  # refer to [place] dermatology re: left face ? BCC [call wife/debbie for appt]  # follow up in 1 months-labs- MD-CBC/CMP;LDH;HOLD tube; posssible1 unit PRBC tranfusion; [place] prior- Dr.B

## 2021-05-29 NOTE — Progress Notes (Signed)
Pt has no new concerns at this time. Does endorse shortness of breath on exertion

## 2021-05-30 DIAGNOSIS — N186 End stage renal disease: Secondary | ICD-10-CM | POA: Diagnosis not present

## 2021-05-30 DIAGNOSIS — Z992 Dependence on renal dialysis: Secondary | ICD-10-CM | POA: Diagnosis not present

## 2021-05-31 DIAGNOSIS — Z992 Dependence on renal dialysis: Secondary | ICD-10-CM | POA: Diagnosis not present

## 2021-05-31 DIAGNOSIS — N186 End stage renal disease: Secondary | ICD-10-CM | POA: Diagnosis not present

## 2021-06-02 DIAGNOSIS — Z992 Dependence on renal dialysis: Secondary | ICD-10-CM | POA: Diagnosis not present

## 2021-06-02 DIAGNOSIS — N186 End stage renal disease: Secondary | ICD-10-CM | POA: Diagnosis not present

## 2021-06-03 DIAGNOSIS — Z992 Dependence on renal dialysis: Secondary | ICD-10-CM | POA: Diagnosis not present

## 2021-06-03 DIAGNOSIS — N186 End stage renal disease: Secondary | ICD-10-CM | POA: Diagnosis not present

## 2021-06-04 DIAGNOSIS — N186 End stage renal disease: Secondary | ICD-10-CM | POA: Diagnosis not present

## 2021-06-04 DIAGNOSIS — Z992 Dependence on renal dialysis: Secondary | ICD-10-CM | POA: Diagnosis not present

## 2021-06-05 ENCOUNTER — Telehealth: Payer: Self-pay | Admitting: Internal Medicine

## 2021-06-05 DIAGNOSIS — N186 End stage renal disease: Secondary | ICD-10-CM | POA: Diagnosis not present

## 2021-06-05 DIAGNOSIS — Z992 Dependence on renal dialysis: Secondary | ICD-10-CM | POA: Diagnosis not present

## 2021-06-05 NOTE — Telephone Encounter (Signed)
Patient called requesting a follow up on the referral to dermatology discussed at last visit as they have not received a call about an appointment.  I explained that the referral was sent and the call would be coming from the Dermatology office.  Routing to team for follow up.

## 2021-06-05 NOTE — Telephone Encounter (Signed)
I Called Dr. Roosvelt Harps office and I confirmed that the office did rcv the fax referral for a [facility] on pt's left face. Receptionist stated that they have been trying to reach the patent since 05/31/21.  Returned patient's wife's phone call. I gave the wife Dr. Roosvelt Harps direct telephone  # and address. She will call the office to get the apt set up.

## 2021-06-06 DIAGNOSIS — N186 End stage renal disease: Secondary | ICD-10-CM | POA: Diagnosis not present

## 2021-06-06 DIAGNOSIS — Z992 Dependence on renal dialysis: Secondary | ICD-10-CM | POA: Diagnosis not present

## 2021-06-07 DIAGNOSIS — C44311 Basal cell carcinoma of skin of nose: Secondary | ICD-10-CM | POA: Diagnosis not present

## 2021-06-07 DIAGNOSIS — D2261 Melanocytic nevi of right upper limb, including shoulder: Secondary | ICD-10-CM | POA: Diagnosis not present

## 2021-06-07 DIAGNOSIS — Z992 Dependence on renal dialysis: Secondary | ICD-10-CM | POA: Diagnosis not present

## 2021-06-07 DIAGNOSIS — D225 Melanocytic nevi of trunk: Secondary | ICD-10-CM | POA: Diagnosis not present

## 2021-06-07 DIAGNOSIS — D2262 Melanocytic nevi of left upper limb, including shoulder: Secondary | ICD-10-CM | POA: Diagnosis not present

## 2021-06-07 DIAGNOSIS — D485 Neoplasm of uncertain behavior of skin: Secondary | ICD-10-CM | POA: Diagnosis not present

## 2021-06-07 DIAGNOSIS — N186 End stage renal disease: Secondary | ICD-10-CM | POA: Diagnosis not present

## 2021-06-09 ENCOUNTER — Other Ambulatory Visit: Payer: Self-pay | Admitting: Family Medicine

## 2021-06-09 DIAGNOSIS — N186 End stage renal disease: Secondary | ICD-10-CM | POA: Diagnosis not present

## 2021-06-09 DIAGNOSIS — I129 Hypertensive chronic kidney disease with stage 1 through stage 4 chronic kidney disease, or unspecified chronic kidney disease: Secondary | ICD-10-CM

## 2021-06-09 DIAGNOSIS — Z992 Dependence on renal dialysis: Secondary | ICD-10-CM | POA: Diagnosis not present

## 2021-06-10 ENCOUNTER — Other Ambulatory Visit (HOSPITAL_COMMUNITY): Payer: Self-pay

## 2021-06-10 DIAGNOSIS — N186 End stage renal disease: Secondary | ICD-10-CM | POA: Diagnosis not present

## 2021-06-10 DIAGNOSIS — Z992 Dependence on renal dialysis: Secondary | ICD-10-CM | POA: Diagnosis not present

## 2021-06-10 NOTE — Telephone Encounter (Signed)
Requested medication (s) are due for refill today Both medications Not due for 30 days  Requested medication (s) are on the active medication list Yes  Future visit scheduled 07/10/21  Note to clinic-Lab protocol not met for Lasix. Routing to office for review.   Requested Prescriptions  Pending Prescriptions Disp Refills   furosemide (LASIX) 20 MG tablet [Pharmacy Med Name: FUROSEMIDE 20 MG Tablet] 90 tablet 1    Sig: TAKE 1 TABLET EVERY DAY     Cardiovascular:  Diuretics - Loop Failed - 06/09/2021  3:27 AM      Failed - Ca in normal range and within 360 days    Calcium  Date Value Ref Range Status  05/29/2021 7.4 (L) 8.9 - 10.3 mg/dL Final   Calcium, Ion  Date Value Ref Range Status  11/19/2020 1.02 (L) 1.15 - 1.40 mmol/L Final          Failed - Cr in normal range and within 360 days    Creatinine  Date Value Ref Range Status  09/30/2013 1.12 0.60 - 1.30 mg/dL Final   Creatinine, Ser  Date Value Ref Range Status  05/29/2021 6.59 (H) 0.61 - 1.24 mg/dL Final   Creatinine, Urine  Date Value Ref Range Status  11/23/2017 56 mg/dL Final          Failed - Last BP in normal range    BP Readings from Last 1 Encounters:  05/29/21 (!) 153/73          Passed - K in normal range and within 360 days    Potassium  Date Value Ref Range Status  05/29/2021 4.8 3.5 - 5.1 mmol/L Final          Passed - Na in normal range and within 360 days    Sodium  Date Value Ref Range Status  05/29/2021 136 135 - 145 mmol/L Final  01/08/2021 138 134 - 144 mmol/L Final          Passed - Valid encounter within last 6 months    Recent Outpatient Visits           5 months ago Benign hypertensive renal disease   [facility] [place], Megan P, DO   6 months ago Preoperative clearance   [facility] [place], Avanti, MD   7 months ago Tobacco abuse   [employer], [place]on, DO   7 months ago Primary myelofibrosis ([place])   [place], [place], DO   11 months ago Benign hypertensive renal disease   [facility] Valerie Roys, DO       Future Appointments             In 3 days Sharolyn Douglas, Clance Boll, NP [facility]   In 1 month [place], [place], DO [employer], [facility]   In 7 months  [place], [facility]             citalopram (CELEXA) 40 MG tablet [Pharmacy Med Name: CITALOPRAM HYDROBROMIDE 40 MG Tablet] 90 tablet 1    Sig: TAKE 1 [place]     Psychiatry:  Antidepressants - SSRI Passed - 06/09/2021  3:27 AM      Passed - Completed PHQ-2 or PHQ-9 in the last 360 days      Passed - Valid encounter within last 6 months    Recent Outpatient Visits           5 months ago  Benign hypertensive renal disease   [facility] [place], Megan P, DO   6 months ago Preoperative clearance   [place], [place]   7 months ago Tobacco abuse   [place], [place], DO   7 months ago Primary myelofibrosis [facility])   [place], [place], [place]   11 months ago Benign hypertensive renal disease   [place], [place], DO       Future Appointments             In 3 days Sharolyn Douglas, Clance Boll, NP [facility], [facility]   In 1 month [place], Barb Merino, DO [employer], [place]   In 7 months  [employer], [place]

## 2021-06-11 DIAGNOSIS — Z992 Dependence on renal dialysis: Secondary | ICD-10-CM | POA: Diagnosis not present

## 2021-06-11 DIAGNOSIS — N186 End stage renal disease: Secondary | ICD-10-CM | POA: Diagnosis not present

## 2021-06-12 ENCOUNTER — Telehealth: Payer: Self-pay | Admitting: Nurse Practitioner

## 2021-06-12 DIAGNOSIS — Z992 Dependence on renal dialysis: Secondary | ICD-10-CM | POA: Diagnosis not present

## 2021-06-12 DIAGNOSIS — N186 End stage renal disease: Secondary | ICD-10-CM | POA: Diagnosis not present

## 2021-06-12 NOTE — Telephone Encounter (Signed)
   FYI   Patient coming to see Sharolyn Douglas tomorrow and wife wants provider to know L side is still numb possibly from recent procedure in February for shoulder .     Per patient had complications during procedure and was told he had multiple strokes and seizures during admission.

## 2021-06-13 ENCOUNTER — Ambulatory Visit (INDEPENDENT_AMBULATORY_CARE_PROVIDER_SITE_OTHER): Payer: Medicare HMO | Admitting: Nurse Practitioner

## 2021-06-13 ENCOUNTER — Encounter: Payer: Self-pay | Admitting: Nurse Practitioner

## 2021-06-13 ENCOUNTER — Other Ambulatory Visit: Payer: Self-pay | Admitting: Family Medicine

## 2021-06-13 ENCOUNTER — Other Ambulatory Visit: Payer: Self-pay

## 2021-06-13 ENCOUNTER — Ambulatory Visit: Payer: Medicare HMO | Admitting: Nurse Practitioner

## 2021-06-13 VITALS — BP 110/70 | HR 84 | Ht 67.0 in | Wt 154.0 lb

## 2021-06-13 DIAGNOSIS — I2510 Atherosclerotic heart disease of native coronary artery without angina pectoris: Secondary | ICD-10-CM | POA: Diagnosis not present

## 2021-06-13 DIAGNOSIS — Z8673 Personal history of transient ischemic attack (TIA), and cerebral infarction without residual deficits: Secondary | ICD-10-CM

## 2021-06-13 DIAGNOSIS — N186 End stage renal disease: Secondary | ICD-10-CM

## 2021-06-13 DIAGNOSIS — E785 Hyperlipidemia, unspecified: Secondary | ICD-10-CM | POA: Diagnosis not present

## 2021-06-13 DIAGNOSIS — I10 Essential (primary) hypertension: Secondary | ICD-10-CM

## 2021-06-13 DIAGNOSIS — Z992 Dependence on renal dialysis: Secondary | ICD-10-CM | POA: Diagnosis not present

## 2021-06-13 DIAGNOSIS — Z720 Tobacco use: Secondary | ICD-10-CM

## 2021-06-13 MED ORDER — CARVEDILOL 25 MG PO TABS: 25.0000 mg | ORAL_TABLET | Freq: Two times a day (BID) | ORAL | 3 refills | Status: DC

## 2021-06-13 NOTE — Patient Instructions (Signed)
Medication Instructions:  Your physician has recommended you make the following change in your medication:   INCREASE Carvedilol to 25 mg twice a day  *If you need a refill on your cardiac medications before your next appointment, please call your pharmacy*   Lab Work: None  If you have labs (blood work) drawn today and your tests are completely normal, you will receive your results only by: [place] (if you have MyChart) OR A paper copy in the mail If you have any lab test that is abnormal or we need to change your treatment, we will call you to review the results.   Testing/Procedures: None    Follow-Up: At [facility], you and your health needs are our priority.  As part of our continuing mission to provide you with exceptional heart care, we have created designated Provider Care Teams.  These Care Teams include your primary Cardiologist (physician) and Advanced Practice Providers (APPs -  Physician Assistants and Nurse Practitioners) who all work together to provide you with the care you need, when you need it.   Your next appointment:   4 month(s)  The format for your next appointment:   In Person  Provider:   Nelva Bush, MD or Murray Hodgkins, NP

## 2021-06-13 NOTE — Telephone Encounter (Signed)
Seen today in clinic with provider and concerns were addressed.

## 2021-06-13 NOTE — Progress Notes (Signed)
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  Past Medical History    Past Medical History:  Diagnosis Date   Anemia    Benign hypertensive renal disease    CAD (coronary artery disease)    a. 08/1996 s/p BMS to the mLAD ([facility]); b. 12/2017 MV: EF 46%, fixed apical defect w/ significant GI uptake artifact. No ischemia-> low risk; c. 07/2020 MV: EF 51%, no ischemia/infarct.   Carotid arterial disease ([place])    a. 11/2020 Carotid U/S: <50% bilat ICA stenoses.   CKD (chronic kidney disease), stage V ([place])    on peritoneal dialysis   Complication of anesthesia    please call by "James Arnold" when waking up!!   COPD (chronic obstructive pulmonary disease) (HCC)    centrilobular emphysema. pt is poorly controlled with his copd/smoking.   Depression    Diastolic dysfunction    a. 12/2017 Echo: EF 60-65%, no rwma, Gr1 DD, mild AI/MR. Nl RVSP; b. 08/2020 Echo: EF 60-65%, no rwma, GrI DD, nl RV fxn. RVSP 32.67mmHg.   Dyspnea on exertion    with exertion   Fatigue    FSGS (focal segmental glomerulosclerosis)    Hyperlipidemia 10/2002   Hypertension    myelofibrosis    bone marrow failure   Myelofibrosis ([place]) 2010   JAK2 (+) primary   Myocardial infarction ([place]) 1997   stent x 1   Pneumoperitoneum 02/2021   PSVT (paroxysmal supraventricular tachycardia) ([place])    a. 12/2020 Zio: Sinus rhythm 80 (55-117). Rare PACs/PVCs. 17 episodes of SVT (longest 20.3 secs, fastest 184). No triggered events. No afib.   Smoker    Stroke [facility])    a. 11/2020 MRI Brain: patchy  acute/early subacute cortical infarcts w/in the R frontal, parietal, and occiptal lobes. Small, chronic L basal ganglia lacunar infarct.   Thrombocytopenia [facility])    Past Surgical History:  Procedure Laterality Date   ANGIOPLASTY     BONE MARROW ASPIRATION  03/2009   CARDIAC CATHETERIZATION  1997   stent x 1   COLONOSCOPY WITH PROPOFOL N/A 08/09/2018   Procedure: COLONOSCOPY WITH PROPOFOL;  Surgeon: [name], MD;  [facility] ENDOSCOPY;  Service: Gastroenterology;  Laterality: N/A;   CORONARY STENT PLACEMENT  08/1996   EXTERIORIZATION OF A CONTINUOUS AMBULATORY PERITONEAL DIALYSIS CATHETER     EYE SURGERY Bilateral    cats removed   REVERSE SHOULDER ARTHROPLASTY Left 11/19/2020   Procedure: Left reverse shoulder arthroplasty;  Surgeon: Leim Fabry, MD;  Location: [facility] ORS;  Service: Orthopedics;  Laterality: Left;    Allergies  No Known Allergies  History of Present Illness    65 year old male with a history of CAD, hypertension, hyperlipidemia, diastolic dysfunction, tobacco abuse, primary myelofibrosis, 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  Cardiac history is notable for bare-metal stenting of the mid LAD in 1997 at [facility].  He was hospitalized in 2019 with chest pain and underwent echocardiogram which showed normal LV function.  Stress testing was performed and was low risk.  November 2021, he was seen in the emergency department with recurrent chest pain mild  troponin elevation to 36.  Stress testing August 21, 2020 was low risk without evidence of ischemia or infarct.  Echocardiogram in December 2021 showed an EF of 60 to 65%.  In late February of this year, he underwent left rotator cuff arthroplasty with postoperative development of seizure and stroke.  Head CT showed remote right frontal and parietal cortical infarcts while MRI of the brain showed acute/early subacute cortical infarcts within the right  frontal, parietal, and occipital lobes.  Echo showed an EF of 50 to 55% with grade 1 diastolic dysfunction.  Bubble study was negative.  Carotid arterial duplex showed less than 50% bilateral internal carotid artery stenoses.  Recommendation was made for TEE and placement of an implantable loop recorder however patient declined both at outpatient follow-up in March.  A Zio monitor was placed and did not show any evidence of atrial fibrillation.  He did have 17 brief episodes of SVT.  There were no triggered events.  [name] was last seen in clinic in June, at which time he continued to note some numbness and tingling throughout his left side, though strength had improved.  He continued to smoke.  He again deferred TEE.  Though he was previously resistant, he was agreeable to initiate low-dose rosuvastatin therapy.  Since his last visit, he has been stable from a cardiac standpoint.  He does not experience chest pain, though overall, activity is limited.  He has chronic, stable dyspnea on exertion.  He continues to smoke 1 pack/day.  After talking with his wife, he decided not to start rosuvastatin, as she had renal failure while on rosuvastatin in the past.  He is not interested in considering an alternate statin.  He has been having elevated blood pressures at home with trends in the 160s and 170s on his home cuff.  This is often associated with headaches.  He has been experiencing abdominal pain for the better part of the past year.  CT of the abdomen and pelvis in June showed large amount of pneumoperitoneum felt to be secondary to peritoneal dialysis catheter connection.  Same CT showed ongoing splenomegaly which is being followed by hematology/oncology.  Patient denies palpitations, PND, orthopnea, dizziness, syncope, edema, or early satiety.  Home Medications    Current Outpatient Medications  Medication Sig Dispense Refill   albuterol (VENTOLIN HFA) 108 (90 Base) MCG/ACT inhaler Inhale 2 puffs into  the lungs every 6 (six) hours as needed for wheezing or shortness of breath. 8 g 2   aspirin 81 MG chewable tablet Chew 1 tablet (81 mg total) by mouth daily. 90 tablet 1   carvedilol (COREG) 25 MG tablet Take 1 tablet (25 mg total) by mouth 2 (two) times daily. 180 tablet 3   Cholecalciferol (VITAMIN D-1000 MAX ST) 25 MCG (1000 UT) tablet Take 1,000 Units by mouth daily.      citalopram (CELEXA) 40 MG tablet Take 1 tablet (40 mg total) by mouth daily. 90 tablet 1   ferrous sulfate 325 (65 FE) MG tablet Take 1 tablet (325 mg total) by mouth daily with breakfast. 30 tablet 1   furosemide (LASIX) 20 MG tablet Take 1 tablet (20 mg total) by mouth daily. 90 tablet 1   gentamicin cream (GARAMYCIN) 0.1 % Apply 1 application topically every other day.     levETIRAcetam (KEPPRA) 500 MG tablet Take 1 tablet (500 mg total) by mouth at bedtime. 30 tablet 0   omeprazole (PRILOSEC) 20 MG capsule Take 1 capsule (20 mg total) by  mouth daily. 90 capsule 1   polyethylene glycol (MIRALAX / GLYCOLAX) 17 g packet Take 17 g by mouth daily as needed. 14 each 0   predniSONE (DELTASONE) 10 MG tablet Take 10 mg by mouth daily with breakfast.     ruxolitinib phosphate (JAKAFI) 15 MG tablet Take 1 pill Monday Wednesday Friday after dialysis. 12 tablet 4   tamsulosin (FLOMAX) 0.4 MG CAPS capsule Take by mouth.     epoetin alfa-epbx (RETACRIT) 30092 UNIT/ML injection 10,000 Units every 30 (thirty) days. At dialysis (Patient not taking: Reported on 06/13/2021)     ondansetron (ZOFRAN ODT) 8 MG disintegrating tablet Take 1 tablet (8 mg total) by mouth every 8 (eight) hours as needed for nausea or vomiting. (Patient not taking: Reported on 06/13/2021) 30 tablet 1   No current facility-administered medications for this visit.     Review of Systems    Chronic, stable dyspnea on exertion.  He has been having headaches and abdominal discomfort.  Since his stroke, he has had left-sided numbness.  He denies chest pain, palpitations,  PND, orthopnea, dizziness, syncope, edema, or early satiety.  All other systems reviewed and are otherwise negative except as noted above.  Physical Exam    VS:  BP 110/70 (BP Location: Left Arm, Patient Position: Sitting, Cuff Size: Normal)   Pulse 84   Ht 5\' 7"  (1.702 m)   Wt 154 lb (69.9 kg)   SpO2 95%   BMI 24.12 kg/m  , BMI Body mass index is 24.12 kg/m.     GEN: Thin, in no acute distress. HEENT: normal. Neck: Supple, no JVD, carotid bruits, or masses. Cardiac: RRR, no murmurs, rubs, or gallops. No clubbing, cyanosis, edema.  Radials/PT 2+ and equal bilaterally.  Respiratory:  Respirations regular and unlabored, diminished breath sounds bilaterally. GI: Soft, nontender, nondistended, BS + x 4. MS: no deformity or atrophy. Skin: warm and dry, no rash. Neuro:  Strength and sensation are intact. Psych: Normal affect.  Accessory Clinical Findings     Lab Results  Component Value Date   WBC 5.1 05/29/2021   HGB 10.6 (L) 05/29/2021   HCT 33.7 (L) 05/29/2021   MCV 93.4 05/29/2021   PLT 123 (L) 05/29/2021   Lab Results  Component Value Date   CREATININE 6.59 (H) 05/29/2021   BUN 52 (H) 05/29/2021   NA 136 05/29/2021   K 4.8 05/29/2021   CL 104 05/29/2021   CO2 25 05/29/2021   Lab Results  Component Value Date   ALT 15 05/29/2021   AST 13 (L) 05/29/2021   ALKPHOS 45 05/29/2021   BILITOT 0.4 05/29/2021   Lab Results  Component Value Date   CHOL 172 01/08/2021   HDL 32 (L) 01/08/2021   LDLCALC 117 (H) 01/08/2021   TRIG 124 01/08/2021   CHOLHDL 5.7 11/21/2020    Lab Results  Component Value Date   HGBA1C 4.9 11/21/2020    Assessment & Plan    1.  Coronary artery disease: Status post bare-metal stenting of the LAD in 1997.  Low risk stress test in November 2021.  Patient has chronic, stable dyspnea on exertion in the setting of ongoing tobacco abuse.  He does not experience chest pain.  He remains on aspirin and beta-blocker therapy.  He was prescribed  rosuvastatin at his last visit but after discussing with his wife, opted not to forego taking it.  He is not interested in alternate statin.  2.  Essential hypertension: Patient has been having headaches at  home and checks his blood pressure regularly.  Pressures on his home device have been trending in the 160s to 170s.  Interestingly, his pressures 110/70 today.  I strongly encouraged him to corroborate his home findings by bringing his device into an office visit either with [place] one of his other providers.  Provided that pressures are truly elevated, he can increase his carvedilol to 25 mg twice daily but he will need to be on the look out for orthostatic symptoms, given prior history of orthostatic hypotension.  3.  Hyperlipidemia: LDL 117 in April 2022.  Patient previous prescribed statin therapy but did not start it and is not interested in trying an alternate.  His wife and renal failure on rosuvastatin and he is not interested in a statin or any cholesterol medication.  4.  History of stroke: Residual left-sided paresthesias though no significant weakness.  Event monitoring back in March did not show any evidence of atrial fibrillation.  Previously TEE and implantable loop recorder were discussed and patient declined.  We did briefly discussed this again today and he remains disinterested.  He remains on aspirin therapy.  Does not wish to be on a statin.  5.  End-stage renal disease: On peritoneal dialysis.  Follows closely with nephrology.  Question role of peritoneal dialysis and ongoing abdominal discomfort.  6.  Tobacco abuse: Complete cessation advised.  Diminished breath sounds without wheezing today.  7.  Myelofibrosis: With progressive splenomegaly.  Followed by hematology.  8.  Disposition: Patient will follow blood pressures at home.  Follow-up in clinic in 4 months or sooner if necessary.  Murray Hodgkins, NP 06/13/2021, 9:47 AM

## 2021-06-14 DIAGNOSIS — Z992 Dependence on renal dialysis: Secondary | ICD-10-CM | POA: Diagnosis not present

## 2021-06-14 DIAGNOSIS — N186 End stage renal disease: Secondary | ICD-10-CM | POA: Diagnosis not present

## 2021-06-16 DIAGNOSIS — Z992 Dependence on renal dialysis: Secondary | ICD-10-CM | POA: Diagnosis not present

## 2021-06-16 DIAGNOSIS — N186 End stage renal disease: Secondary | ICD-10-CM | POA: Diagnosis not present

## 2021-06-17 DIAGNOSIS — N186 End stage renal disease: Secondary | ICD-10-CM | POA: Diagnosis not present

## 2021-06-17 DIAGNOSIS — Z992 Dependence on renal dialysis: Secondary | ICD-10-CM | POA: Diagnosis not present

## 2021-06-18 DIAGNOSIS — Z992 Dependence on renal dialysis: Secondary | ICD-10-CM | POA: Diagnosis not present

## 2021-06-18 DIAGNOSIS — N186 End stage renal disease: Secondary | ICD-10-CM | POA: Diagnosis not present

## 2021-06-19 DIAGNOSIS — N186 End stage renal disease: Secondary | ICD-10-CM | POA: Diagnosis not present

## 2021-06-19 DIAGNOSIS — Z992 Dependence on renal dialysis: Secondary | ICD-10-CM | POA: Diagnosis not present

## 2021-06-20 DIAGNOSIS — N186 End stage renal disease: Secondary | ICD-10-CM | POA: Diagnosis not present

## 2021-06-20 DIAGNOSIS — Z992 Dependence on renal dialysis: Secondary | ICD-10-CM | POA: Diagnosis not present

## 2021-06-21 ENCOUNTER — Other Ambulatory Visit: Payer: Self-pay

## 2021-06-21 ENCOUNTER — Ambulatory Visit
Admission: RE | Admit: 2021-06-21 | Discharge: 2021-06-21 | Disposition: A | Discharge status: Home / Self Care | Payer: Medicare HMO | Source: Ambulatory Visit | Attending: Internal Medicine | Admitting: Internal Medicine

## 2021-06-21 DIAGNOSIS — R161 Splenomegaly, not elsewhere classified: Secondary | ICD-10-CM | POA: Diagnosis not present

## 2021-06-21 DIAGNOSIS — N186 End stage renal disease: Secondary | ICD-10-CM | POA: Diagnosis not present

## 2021-06-21 DIAGNOSIS — Z992 Dependence on renal dialysis: Secondary | ICD-10-CM | POA: Diagnosis not present

## 2021-06-21 DIAGNOSIS — D471 Chronic myeloproliferative disease: Secondary | ICD-10-CM | POA: Diagnosis not present

## 2021-06-21 DIAGNOSIS — D7581 Myelofibrosis: Secondary | ICD-10-CM | POA: Diagnosis not present

## 2021-06-23 DIAGNOSIS — Z992 Dependence on renal dialysis: Secondary | ICD-10-CM | POA: Diagnosis not present

## 2021-06-23 DIAGNOSIS — N186 End stage renal disease: Secondary | ICD-10-CM | POA: Diagnosis not present

## 2021-06-24 DIAGNOSIS — Z992 Dependence on renal dialysis: Secondary | ICD-10-CM | POA: Diagnosis not present

## 2021-06-24 DIAGNOSIS — N186 End stage renal disease: Secondary | ICD-10-CM | POA: Diagnosis not present

## 2021-06-25 DIAGNOSIS — Z992 Dependence on renal dialysis: Secondary | ICD-10-CM | POA: Diagnosis not present

## 2021-06-25 DIAGNOSIS — N186 End stage renal disease: Secondary | ICD-10-CM | POA: Diagnosis not present

## 2021-06-26 ENCOUNTER — Inpatient Hospital Stay: Payer: Medicare HMO | Attending: Internal Medicine

## 2021-06-26 ENCOUNTER — Inpatient Hospital Stay: Payer: Medicare HMO

## 2021-06-26 ENCOUNTER — Inpatient Hospital Stay (HOSPITAL_BASED_OUTPATIENT_CLINIC_OR_DEPARTMENT_OTHER): Payer: Medicare HMO | Admitting: Internal Medicine

## 2021-06-26 VITALS — BP 148/71 | HR 71 | Temp 97.9°F | Resp 18 | Wt 156.0 lb

## 2021-06-26 DIAGNOSIS — Z8673 Personal history of transient ischemic attack (TIA), and cerebral infarction without residual deficits: Secondary | ICD-10-CM | POA: Insufficient documentation

## 2021-06-26 DIAGNOSIS — D471 Chronic myeloproliferative disease: Secondary | ICD-10-CM | POA: Diagnosis not present

## 2021-06-26 DIAGNOSIS — D649 Anemia, unspecified: Secondary | ICD-10-CM | POA: Diagnosis not present

## 2021-06-26 DIAGNOSIS — Z7952 Long term (current) use of systemic steroids: Secondary | ICD-10-CM | POA: Insufficient documentation

## 2021-06-26 DIAGNOSIS — R109 Unspecified abdominal pain: Secondary | ICD-10-CM | POA: Diagnosis not present

## 2021-06-26 DIAGNOSIS — R161 Splenomegaly, not elsewhere classified: Secondary | ICD-10-CM | POA: Diagnosis not present

## 2021-06-26 DIAGNOSIS — N186 End stage renal disease: Secondary | ICD-10-CM | POA: Insufficient documentation

## 2021-06-26 DIAGNOSIS — Z992 Dependence on renal dialysis: Secondary | ICD-10-CM | POA: Diagnosis not present

## 2021-06-26 DIAGNOSIS — L989 Disorder of the skin and subcutaneous tissue, unspecified: Secondary | ICD-10-CM | POA: Diagnosis not present

## 2021-06-26 LAB — CBC WITH DIFFERENTIAL/PLATELET
Abs Immature Granulocytes: 0.03 10*3/uL (ref 0.00–0.07)
Basophils Absolute: 0 10*3/uL (ref 0.0–0.1)
Basophils Relative: 0 %
Eosinophils Absolute: 0 10*3/uL (ref 0.0–0.5)
Eosinophils Relative: 0 %
HCT: 35.6 % — ABNORMAL LOW (ref 39.0–52.0)
Hemoglobin: 11.1 g/dL — ABNORMAL LOW (ref 13.0–17.0)
Immature Granulocytes: 1 %
Lymphocytes Relative: 6 %
Lymphs Abs: 0.3 10*3/uL — ABNORMAL LOW (ref 0.7–4.0)
MCH: 29.1 pg (ref 26.0–34.0)
MCHC: 31.2 g/dL (ref 30.0–36.0)
MCV: 93.2 fL (ref 80.0–100.0)
Monocytes Absolute: 0.2 10*3/uL (ref 0.1–1.0)
Monocytes Relative: 3 %
Neutro Abs: 5.1 10*3/uL (ref 1.7–7.7)
Neutrophils Relative %: 90 %
Platelets: 137 10*3/uL — ABNORMAL LOW (ref 150–400)
RBC: 3.82 MIL/uL — ABNORMAL LOW (ref 4.22–5.81)
RDW: 15.2 % (ref 11.5–15.5)
WBC: 5.6 10*3/uL (ref 4.0–10.5)
nRBC: 0 % (ref 0.0–0.2)

## 2021-06-26 LAB — COMPREHENSIVE METABOLIC PANEL
ALT: 9 U/L (ref 0–44)
AST: 11 U/L — ABNORMAL LOW (ref 15–41)
Albumin: 3.2 g/dL — ABNORMAL LOW (ref 3.5–5.0)
Alkaline Phosphatase: 44 U/L (ref 38–126)
Anion gap: 10 (ref 5–15)
BUN: 58 mg/dL — ABNORMAL HIGH (ref 8–23)
CO2: 25 mmol/L (ref 22–32)
Calcium: 7.6 mg/dL — ABNORMAL LOW (ref 8.9–10.3)
Chloride: 102 mmol/L (ref 98–111)
Creatinine, Ser: 7.6 mg/dL — ABNORMAL HIGH (ref 0.61–1.24)
GFR, Estimated: 7 mL/min — ABNORMAL LOW (ref >60)
Glucose, Bld: 94 mg/dL (ref 70–99)
Potassium: 4.8 mmol/L (ref 3.5–5.1)
Sodium: 137 mmol/L (ref 135–145)
Total Bilirubin: 0.9 mg/dL (ref 0.3–1.2)
Total Protein: 5.9 g/dL — ABNORMAL LOW (ref 6.5–8.1)

## 2021-06-26 LAB — LACTATE DEHYDROGENASE: LDH: 613 U/L — ABNORMAL HIGH (ref 98–192)

## 2021-06-26 LAB — SAMPLE TO BLOOD BANK

## 2021-06-26 NOTE — Progress Notes (Signed)
[place] OFFICE PROGRESS NOTE  Patient Care Team: Valerie Roys, DO as PCP - General (Family Medicine) End, Harrell Gave, MD as PCP - Cardiology (Cardiology) Cammie Sickle, MD as Consulting Physician (Internal Medicine) Dasher, Rayvon Char, MD as Consulting Physician (Dermatology)  Cancer Staging No matching staging information was found for the patient.   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   # AUGUST 1st week 2019- Retacrit   # October 2nd 2019- jakafi 10 BID [? Renal involvement]; September 2020-peritoneal dialysis; Jakafi 10 mg in the morning; STOPPED/tapered DHRC1UL, 2021 [sec intolerance fatigue/myalgias]  # May 4th 2022- START [name] dosing/PD]; STOPPED after2-3 weeks-because of poor tolerance [sever fatigue/? 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 [name]- kidney Bx- FSG [ on Prednisone Dr.[name]]; July 2020-peritoneal dialysis;   # hx of Lung nodules- resolved [[name]]/quit smoking.  DIAGNOSIS: PRIMARY MYELOFIBROSIS  RISK:LOW     ;GOALS: Control  CURRENT/MOST RECENT THERAPY : Surveillance    Primary myelofibrosis ([place])      INTERVAL HISTORY:Ambulating independently.  He is alone.   James Arnold 65 y.o.  male pleasant patient above history of primary myelofibrosis Jak 2+ currently off [place] surveillance chronic kidney disease on peritoneal dialysis is here for follow-up/review results of the ultrasound.  Patient complains of intermittent abdominal discomfort.  And epigastric region.  He is currently on Prilosec once a day.  Has chronic fatigue.  No hospitalizations.  Not losing any weight.  Appetite is good.  Denies  any early satiety.  No chills.   Review of Systems  Constitutional:  Positive for malaise/fatigue. Negative for chills, diaphoresis and fever.  HENT:  Negative for nosebleeds and sore throat.   Eyes:  Negative for double vision.  Respiratory:  Positive for cough and shortness of breath. Negative for hemoptysis, sputum production and wheezing.   Cardiovascular:  Negative for chest pain, palpitations and orthopnea.  Gastrointestinal:  Negative for abdominal pain, blood in stool, constipation, diarrhea, heartburn, melena, nausea and vomiting.  Genitourinary:  Negative for dysuria, frequency and urgency.  Musculoskeletal:  Positive for back pain and joint pain.  Skin: Negative.  Negative for itching and rash.  Neurological:  Positive for dizziness and tingling. Negative for focal weakness, weakness and headaches.  Endo/Heme/Allergies:  Does not bruise/bleed easily.  Psychiatric/Behavioral:  Negative for depression. The patient is not nervous/anxious and does not have insomnia.      PAST MEDICAL HISTORY :  Past Medical History:  Diagnosis Date   Anemia    Benign hypertensive renal disease    CAD (coronary artery disease)    a. 08/1996 s/p BMS to the mLAD ([facility]); b. 12/2017 MV: EF 46%, fixed apical defect w/ significant GI uptake artifact. No ischemia-> low risk; c. 07/2020 MV: EF 51%, no ischemia/infarct.   Carotid arterial disease ([place])    a. 11/2020 Carotid U/S: <50% bilat ICA stenoses.   CKD (chronic kidney disease), stage V ([place])    on peritoneal dialysis   Complication of anesthesia    please call by "[name]" when waking up!!   COPD (chronic obstructive pulmonary disease) (HCC)    centrilobular emphysema. pt is poorly controlled with his copd/smoking.   Depression    Diastolic dysfunction  a. 12/2017 Echo: EF 60-65%, no rwma, Gr1 DD, mild AI/MR. Nl RVSP; b. 08/2020 Echo: EF 60-65%, no rwma, GrI DD, nl RV fxn. RVSP 32.4mHg.   Dyspnea on exertion    with exertion   Fatigue     FSGS (focal segmental glomerulosclerosis)    Hyperlipidemia 10/2002   Hypertension    myelofibrosis    bone marrow failure   [place]   JAK2 (+) primary   [place]ocardial [place]   stent x 1   Pneumoperitoneum 02/2021   PSVT ([place]oxysmal [place]ricular tachycardia) ([place]    a. 12/2020 Zio: Sinus rhythm 80 (55-117). Rare PACs/PVCs. 17 episodes of SVT (longest 20.3 secs, fastest 184). No triggered events. No afib.   Smoker    Stroke ([facility]    a. 11/2020 MRI Brain: patchy acute/early subacute cortical infarcts w/in the R frontal, parietal, and occiptal lobes. Small, chronic L basal ganglia lacunar infarct.   [place]ocyt[place]     PAST SURGICAL HISTORY :   Past Surgical History:  Procedure Laterality Date   ANGIOPLASTY     BONE MARROW ASPIRATION  03/2009   CARDIAC CATHETERIZATION  1997   stent x 1   COLONOSCOPY WITH PROPOFOL N/A 08/09/2018   Procedure: COLONOSCOPY WITH PROPOFOL;  Surgeon: AJonathon Bellows MD;  [facility];  Service: Gastroenterology;  Laterality: N/A;   CORONARY STENT PLACEMENT  08/1996   EXTERIORIZATION OF A CONTINUOUS AMBULATORY PERITONEAL DIALYSIS CATHETER     EYE SURGERY Bilateral    cats removed   REVERSE SHOULDER ARTHROPLASTY Left 11/19/2020   Procedure: Left reverse shoulder arthroplasty;  Surgeon: PLeim Fabry MD;  Location: [facility] ORS;  Service: Orthopedics;  Laterality: Left;    FAMILY HISTORY :   Family History  Problem Relation Age of Onset   Stroke Mother        Low BP stroke   Depression Father    Depression Sister        Breast   Heart disease Other    Breast cancer Other    Lung cancer Other    Ovarian cancer Other    Stomach cancer Other     SOCIAL HISTORY:   Social History   Tobacco Use   Smoking status: Every Day    Packs/day: 0.75    Years: 47.00    Pack years: 35.25    Types: Cigarettes   Smokeless tobacco: Never   Tobacco comments:    trying to cut back-smokes 6 to 7 cigs per day  Vaping  Use   Vaping Use: Never used  Substance Use Topics   Alcohol use: Yes    Comment: "6 beers weekly on Saturdays"   Drug use: No    ALLERGIES:  has No Known Allergies.  MEDICATIONS:  Current Outpatient Medications  Medication Sig Dispense Refill   albuterol (VENTOLIN HFA) 108 (90 Base) MCG/ACT inhaler Inhale 2 puffs into the lungs every 6 (six) hours as needed for wheezing or shortness of breath. 8 g 2   aspirin 81 MG chewable tablet Chew 1 tablet (81 mg total) by mouth daily. 90 tablet 1   carvedilol (COREG) 25 MG tablet Take 1 tablet (25 mg total) by mouth 2 (two) times daily. 180 tablet 3   Cholecalciferol (VITAMIN D-1000 MAX ST) 25 MCG (1000 UT) tablet Take 1,000 Units by mouth daily.      citalopram (CELEXA) 40 MG tablet Take 1 tablet (40 mg total) by mouth daily. 90 tablet 1   ferrous sulfate 325 (65 FE)  MG tablet Take 1 tablet (325 mg total) by mouth daily with breakfast. 30 tablet 1   furosemide (LASIX) 20 MG tablet Take 1 tablet (20 mg total) by mouth daily. 90 tablet 1   gentamicin cream (GARAMYCIN) 0.1 % Apply 1 application topically every other day.     levETIRAcetam (KEPPRA) 500 MG tablet Take 1 tablet (500 mg total) by mouth at bedtime. 30 tablet 0   omeprazole (PRILOSEC) 20 MG capsule TAKE 1 CAPSULE EVERY DAY 90 capsule 1   polyethylene glycol (MIRALAX / GLYCOLAX) 17 g packet Take 17 g by mouth daily as needed. 14 each 0   predniSONE (DELTASONE) 10 MG tablet Take 10 mg by mouth daily with breakfast.     No current facility-administered medications for this visit.    PHYSICAL EXAMINATION: ECOG PERFORMANCE STATUS: 1 - Symptomatic but completely ambulatory  BP (!) 148/71 (BP Location: Right Arm, Patient Position: Sitting)   Pulse 71   Temp 97.9 F (36.6 C) (Tympanic)   Resp 18   Wt 156 lb (70.8 kg)   SpO2 98%   BMI 24.43 kg/m   Filed Weights   06/26/21 0929  Weight: 156 lb (70.8 kg)    Physical Exam HENT:     Head: Normocephalic and atraumatic.      Mouth/Throat:     Pharynx: No oropharyngeal exudate.  Eyes:     Pupils: Pupils are equal, round, and reactive to light.  Cardiovascular:     Rate and Rhythm: Normal rate and regular rhythm.  Pulmonary:     Effort: No respiratory distress.     Breath sounds: No wheezing.     Comments: Decreased air entry bilaterally. Abdominal:     General: Bowel sounds are normal. There is no distension.     Palpations: Abdomen is soft. There is no mass.     Tenderness: There is no abdominal tenderness. There is no guarding or rebound.     Comments: Positive for splenomegaly.  Musculoskeletal:        General: No tenderness. Normal range of motion.     Cervical back: Normal range of motion and neck supple.  Skin:    General: Skin is warm.     Comments: Left nasal fold-half a centimeter skin lesion noted concerning for BCC.   Neurological:     Mental Status: He is alert and oriented to person, place, and time.     Comments: Left upper extremity weakness noted.    Psychiatric:        Mood and Affect: Affect normal.    LABORATORY DATA:  I have reviewed the data as listed    Component Value Date/Time   NA 137 06/26/2021 0910   NA 138 01/08/2021 0915   K 4.8 06/26/2021 0910   CL 102 06/26/2021 0910   CO2 25 06/26/2021 0910   GLUCOSE 94 06/26/2021 0910   BUN 58 (H) 06/26/2021 0910   BUN 63 (H) 01/08/2021 0915   CREATININE 7.60 (H) 06/26/2021 0910   CREATININE 1.12 09/30/2013 0953   CALCIUM 7.6 (L) 06/26/2021 0910   PROT 5.9 (L) 06/26/2021 0910   PROT 5.5 (L) 01/08/2021 0915   ALBUMIN 3.2 (L) 06/26/2021 0910   ALBUMIN 3.7 (L) 01/08/2021 0915   AST 11 (L) 06/26/2021 0910   ALT 9 06/26/2021 0910   ALKPHOS 44 06/26/2021 0910   BILITOT 0.9 06/26/2021 0910   BILITOT 0.3 01/08/2021 0915   GFRNONAA 7 (L) 06/26/2021 0910   GFRNONAA >60 09/30/2013 5537  GFRAA 14 (L) 07/10/2020 0909   GFRAA >60 09/30/2013 0953    No results found for: SPEP, UPEP  Lab Results  Component Value Date   WBC  5.6 06/26/2021   NEUTROABS 5.1 06/26/2021   HGB 11.1 (L) 06/26/2021   HCT 35.6 (L) 06/26/2021   MCV 93.2 06/26/2021   PLT 137 (L) 06/26/2021      Chemistry      Component Value Date/Time   NA 137 06/26/2021 0910   NA 138 01/08/2021 0915   K 4.8 06/26/2021 0910   CL 102 06/26/2021 0910   CO2 25 06/26/2021 0910   BUN 58 (H) 06/26/2021 0910   BUN 63 (H) 01/08/2021 0915   CREATININE 7.60 (H) 06/26/2021 0910   CREATININE 1.12 09/30/2013 0953      Component Value Date/Time   CALCIUM 7.6 (L) 06/26/2021 0910   ALKPHOS 44 06/26/2021 0910   AST 11 (L) 06/26/2021 0910   ALT 9 06/26/2021 0910   BILITOT 0.9 06/26/2021 0910   BILITOT 0.3 01/08/2021 0915       RADIOGRAPHIC STUDIES: I have personally reviewed the radiological images as listed and agreed with the findings in the report. No results found.   ASSESSMENT & PLAN:  Primary myelofibrosis ([place]) # Primary Myelofiborisis [Bone marrow 2010] jak-2 POSITIVE;currently on surveillance; Patient discontinued Jakafi-because of poor tolerance.     # OCT 1st, 2022-US Splenomegaly with increase in splenic size from prior imaging, current splenic volume 1024cc; patient symptomatic from fatigue/question abdominal discomfort from splenomegaly versus other causes [see below] LDH is trending up.  Could consider Fedratinib/Lenalidomide or hydrea-however concerned about tolerance.  If patient has continued abdominal discomfort-would consider treatment of myelofibrosis at next visit.  # Abdominal pain/egigastric pain- cut prendisone 5 mg/day; and Take PPI Twice a day; before meals.  #Anemia hemoglobin is 8-9. Hb today- 11.2.  Continue EPO stimulating agent /IV iron as per nephrology   #Stroke/left upper extremity weakness/seizures- [s/p shoulder surgery] STABLE;  on antiplatelet therapy.   #COPD-poorly controlled; declines PFTs;on prednisone 10 mg a day- see below; STABLE.   #End-stage renal disease [Dr. Latif]  on PD; STABLE.   #Left nasal  fold lesion-question BCC;s/p [place] dermatology;awaiing Mohs surgeon- [facility].   # COVID vaccine: DECLINED Covid-19 vaccine.   I spoke at length with the patient's wife- regarding the patient's clinical status/plan of care.  Family agreement.   # DISPOSITION: mychart apt. # HOLD PRBC transfusion  # follow up in 1 months-labs- MD-CBC/CMP;LDH;HOLD tube; posssible1 unit PRBC tranfusion- Dr.B  Orders Placed This Encounter  Procedures   CBC with Differential    Standing Status:   Future    Standing Expiration Date:   06/26/2022   Comprehensive metabolic panel    Standing Status:   Future    Standing Expiration Date:   06/26/2022   Lactate dehydrogenase    Standing Status:   Future    Standing Expiration Date:   06/26/2022   Hold Tube- Blood Bank    Standing Status:   Future    Standing Expiration Date:   06/26/2022   All questions were answered. The patient knows to call the clinic with any problems, questions or concerns.     Cammie Sickle, MD 06/26/2021 4:04 PM

## 2021-06-26 NOTE — Assessment & Plan Note (Addendum)
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     # OCT 1st, 2022-US Splenomegaly with increase in splenic size from prior imaging, current splenic volume 1024cc; patient symptomatic from fatigue/question abdominal discomfort from splenomegaly versus other causes [see below] LDH is trending up.  Could consider Fedratinib/Lenalidomide or hydrea-however concerned about tolerance.  If patient has continued abdominal discomfort-would consider treatment of myelofibrosis at next visit.  # Abdominal pain/egigastric pain- cut prendisone 5 mg/day; and Take PPI Twice a day; before meals.  #Anemia hemoglobin is 8-9. Hb today- 11.2.  Continue EPO stimulating agent /IV iron as per nephrology   #Stroke/left upper extremity weakness/seizures- [s/p shoulder surgery] STABLE;  on antiplatelet therapy.  #COPD-poorly controlled; declines PFTs;on prednisone 10 mg a day- see below; STABLE.   #End-stage renal disease [Dr. Latif]  on PD; STABLE.   #Left nasal fold lesion-question BCC;s/p [place] dermatology;awaiing Mohs surgeon- [facility].   # COVID vaccine: DECLINED Covid-19 vaccine.   I spoke at length with the patient's wife- regarding the patient's clinical status/plan of care.  Family agreement.   # DISPOSITION: mychart apt. # HOLD PRBC transfusion  # follow up in 1 months-labs- MD-CBC/CMP;LDH;HOLD tube; posssible1 unit PRBC tranfusion- Dr.B

## 2021-06-26 NOTE — Progress Notes (Signed)
Patient reports pain across mid abdomen that comes and goes and SOB with exertion

## 2021-06-27 DIAGNOSIS — Z992 Dependence on renal dialysis: Secondary | ICD-10-CM | POA: Diagnosis not present

## 2021-06-27 DIAGNOSIS — N186 End stage renal disease: Secondary | ICD-10-CM | POA: Diagnosis not present

## 2021-06-28 DIAGNOSIS — N186 End stage renal disease: Secondary | ICD-10-CM | POA: Diagnosis not present

## 2021-06-28 DIAGNOSIS — Z992 Dependence on renal dialysis: Secondary | ICD-10-CM | POA: Diagnosis not present

## 2021-06-30 DIAGNOSIS — Z992 Dependence on renal dialysis: Secondary | ICD-10-CM | POA: Diagnosis not present

## 2021-06-30 DIAGNOSIS — N186 End stage renal disease: Secondary | ICD-10-CM | POA: Diagnosis not present

## 2021-07-01 DIAGNOSIS — Z992 Dependence on renal dialysis: Secondary | ICD-10-CM | POA: Diagnosis not present

## 2021-07-01 DIAGNOSIS — N186 End stage renal disease: Secondary | ICD-10-CM | POA: Diagnosis not present

## 2021-07-02 DIAGNOSIS — Z992 Dependence on renal dialysis: Secondary | ICD-10-CM | POA: Diagnosis not present

## 2021-07-02 DIAGNOSIS — N186 End stage renal disease: Secondary | ICD-10-CM | POA: Diagnosis not present

## 2021-07-03 DIAGNOSIS — N186 End stage renal disease: Secondary | ICD-10-CM | POA: Diagnosis not present

## 2021-07-03 DIAGNOSIS — Z992 Dependence on renal dialysis: Secondary | ICD-10-CM | POA: Diagnosis not present

## 2021-07-04 ENCOUNTER — Telehealth: Payer: Self-pay | Admitting: *Deleted

## 2021-07-04 DIAGNOSIS — N186 End stage renal disease: Secondary | ICD-10-CM | POA: Diagnosis not present

## 2021-07-04 DIAGNOSIS — Z992 Dependence on renal dialysis: Secondary | ICD-10-CM | POA: Diagnosis not present

## 2021-07-04 NOTE — Telephone Encounter (Signed)
Nps- pls advise.

## 2021-07-04 NOTE — Telephone Encounter (Signed)
Wife advised to take patient to the ER. Pt refuses as his symptoms have resolved. Pt states that he is feeling better. I again advised patient/wife to go to the ER given his h/o strokes. Pt is cognitive and not having any slurring of speech. Pt declines to go to ER. He will go if his symptoms comes back or get worse.

## 2021-07-04 NOTE — Telephone Encounter (Signed)
James Arnold called reporting that patient left face is numb, there is no paralysis, facial drooping or difficulty eating or drinking, but his face is numb. [name] she is unsure if there is need to be concerned. Please advise

## 2021-07-05 ENCOUNTER — Ambulatory Visit: Payer: Self-pay

## 2021-07-05 ENCOUNTER — Encounter: Payer: Self-pay | Admitting: Emergency Medicine

## 2021-07-05 ENCOUNTER — Other Ambulatory Visit: Payer: Self-pay

## 2021-07-05 ENCOUNTER — Emergency Department: Payer: Medicare HMO

## 2021-07-05 ENCOUNTER — Emergency Department
Admission: EM | Admit: 2021-07-05 | Discharge: 2021-07-05 | Disposition: A | Discharge status: Home / Self Care | Payer: Medicare HMO | Attending: Student in an Organized Health Care Education/Training Program | Admitting: Student in an Organized Health Care Education/Training Program

## 2021-07-05 DIAGNOSIS — Z79899 Other long term (current) drug therapy: Secondary | ICD-10-CM | POA: Diagnosis not present

## 2021-07-05 DIAGNOSIS — F17210 Nicotine dependence, cigarettes, uncomplicated: Secondary | ICD-10-CM | POA: Diagnosis not present

## 2021-07-05 DIAGNOSIS — I6521 Occlusion and stenosis of right carotid artery: Secondary | ICD-10-CM | POA: Diagnosis not present

## 2021-07-05 DIAGNOSIS — I672 Cerebral atherosclerosis: Secondary | ICD-10-CM | POA: Insufficient documentation

## 2021-07-05 DIAGNOSIS — N186 End stage renal disease: Secondary | ICD-10-CM | POA: Insufficient documentation

## 2021-07-05 DIAGNOSIS — I2510 Atherosclerotic heart disease of native coronary artery without angina pectoris: Secondary | ICD-10-CM | POA: Insufficient documentation

## 2021-07-05 DIAGNOSIS — R202 Paresthesia of skin: Secondary | ICD-10-CM

## 2021-07-05 DIAGNOSIS — I132 Hypertensive heart and chronic kidney disease with heart failure and with stage 5 chronic kidney disease, or end stage renal disease: Secondary | ICD-10-CM | POA: Diagnosis not present

## 2021-07-05 DIAGNOSIS — J449 Chronic obstructive pulmonary disease, unspecified: Secondary | ICD-10-CM | POA: Diagnosis not present

## 2021-07-05 DIAGNOSIS — Z8673 Personal history of transient ischemic attack (TIA), and cerebral infarction without residual deficits: Secondary | ICD-10-CM | POA: Diagnosis not present

## 2021-07-05 DIAGNOSIS — R112 Nausea with vomiting, unspecified: Secondary | ICD-10-CM | POA: Diagnosis not present

## 2021-07-05 DIAGNOSIS — Z955 Presence of coronary angioplasty implant and graft: Secondary | ICD-10-CM | POA: Insufficient documentation

## 2021-07-05 DIAGNOSIS — Z992 Dependence on renal dialysis: Secondary | ICD-10-CM | POA: Diagnosis not present

## 2021-07-05 DIAGNOSIS — Z96612 Presence of left artificial shoulder joint: Secondary | ICD-10-CM | POA: Insufficient documentation

## 2021-07-05 DIAGNOSIS — R930 Abnormal findings on diagnostic imaging of skull and head, not elsewhere classified: Secondary | ICD-10-CM | POA: Insufficient documentation

## 2021-07-05 DIAGNOSIS — Z7982 Long term (current) use of aspirin: Secondary | ICD-10-CM | POA: Diagnosis not present

## 2021-07-05 DIAGNOSIS — I5030 Unspecified diastolic (congestive) heart failure: Secondary | ICD-10-CM | POA: Diagnosis not present

## 2021-07-05 DIAGNOSIS — I10 Essential (primary) hypertension: Secondary | ICD-10-CM | POA: Diagnosis not present

## 2021-07-05 LAB — COMPREHENSIVE METABOLIC PANEL
ALT: 11 U/L (ref 0–44)
AST: 14 U/L — ABNORMAL LOW (ref 15–41)
Albumin: 3.3 g/dL — ABNORMAL LOW (ref 3.5–5.0)
Alkaline Phosphatase: 50 U/L (ref 38–126)
Anion gap: 13 (ref 5–15)
BUN: 43 mg/dL — ABNORMAL HIGH (ref 8–23)
CO2: 24 mmol/L (ref 22–32)
Calcium: 8.1 mg/dL — ABNORMAL LOW (ref 8.9–10.3)
Chloride: 103 mmol/L (ref 98–111)
Creatinine, Ser: 8.14 mg/dL — ABNORMAL HIGH (ref 0.61–1.24)
GFR, Estimated: 7 mL/min — ABNORMAL LOW (ref >60)
Glucose, Bld: 103 mg/dL — ABNORMAL HIGH (ref 70–99)
Potassium: 4.5 mmol/L (ref 3.5–5.1)
Sodium: 140 mmol/L (ref 135–145)
Total Bilirubin: 0.8 mg/dL (ref 0.3–1.2)
Total Protein: 6 g/dL — ABNORMAL LOW (ref 6.5–8.1)

## 2021-07-05 LAB — APTT: aPTT: 30 seconds (ref 24–36)

## 2021-07-05 LAB — CBC
HCT: 35.9 % — ABNORMAL LOW (ref 39.0–52.0)
Hemoglobin: 11.7 g/dL — ABNORMAL LOW (ref 13.0–17.0)
MCH: 29.3 pg (ref 26.0–34.0)
MCHC: 32.6 g/dL (ref 30.0–36.0)
MCV: 90 fL (ref 80.0–100.0)
Platelets: 131 10*3/uL — ABNORMAL LOW (ref 150–400)
RBC: 3.99 MIL/uL — ABNORMAL LOW (ref 4.22–5.81)
RDW: 15.3 % (ref 11.5–15.5)
WBC: 5 10*3/uL (ref 4.0–10.5)
nRBC: 0 % (ref 0.0–0.2)

## 2021-07-05 LAB — DIFFERENTIAL
Abs Immature Granulocytes: 0.02 10*3/uL (ref 0.00–0.07)
Basophils Absolute: 0 10*3/uL (ref 0.0–0.1)
Basophils Relative: 0 %
Eosinophils Absolute: 0 10*3/uL (ref 0.0–0.5)
Eosinophils Relative: 1 %
Immature Granulocytes: 0 %
Lymphocytes Relative: 8 %
Lymphs Abs: 0.4 10*3/uL — ABNORMAL LOW (ref 0.7–4.0)
Monocytes Absolute: 0.2 10*3/uL (ref 0.1–1.0)
Monocytes Relative: 3 %
Neutro Abs: 4.4 10*3/uL (ref 1.7–7.7)
Neutrophils Relative %: 88 %

## 2021-07-05 LAB — PROTIME-INR
INR: 1.2 (ref 0.8–1.2)
Prothrombin Time: 14.9 seconds (ref 11.4–15.2)

## 2021-07-05 MED ORDER — PANTOPRAZOLE SODIUM 20 MG PO TBEC: 20.0000 mg | DELAYED_RELEASE_TABLET | Freq: Every day | ORAL | 1 refills | Status: DC

## 2021-07-05 MED ORDER — SODIUM CHLORIDE 0.9% FLUSH: 3.0000 mL | Freq: Once | INTRAVENOUS | Status: DC

## 2021-07-05 MED ORDER — ONDANSETRON 4 MG PO TBDP: 4.0000 mg | ORAL_TABLET | Freq: Three times a day (TID) | ORAL | 0 refills | Status: DC | PRN

## 2021-07-05 NOTE — Telephone Encounter (Signed)
I called and spoke with patient wife and asked that she contact patient PCP to inform her of what happened per request of Dr B. She stated that she will let her know and stated that the numbness went away yesterday afternoon and has not come back

## 2021-07-05 NOTE — Telephone Encounter (Signed)
Pt's wife called to report pt  vomiting coffee ground material for the past month. Pt was incontinent of stool and the color was brownish black. Pt c/o weakness. Takes baby aspirin daily- advised to stop for now. Pt c/o weakness and stomach pain but will not elaborate the quality of pain. Care advice given and pt is refusing to go to ED. Discussed with pt's wife the symptoms of GI bleed if that is what is occurring and the risks of not being evaluated. Pt's wife stated she will call 911 and a friend to make him go to the hospital.         Reason for Disposition  Weak, dizzy or lightheaded    Pt's wife called to report pt has been vomiting black emesis "with black flakes."  Additional Information  Commented on: [1] Vomiting AND [2] contains black ("coffee ground") material    Yes see above  Commented on: [1] Constant abdominal pain AND [2] present > 2 hours    C/o stomach pain.  Answer Assessment - Initial Assessment Questions 1. APPEARANCE of BLOOD: "What does the blood look like?" (e.g., color, coffee-grounds)     Black flecks for a month 2. AMOUNT: "How much blood was lost?"     unknown 3. VOMITING BLOOD: "How many times did it happen?" or "How many times in the past 24 hours?"     Month--unsure 4. VOMITING WITHOUT BLOOD: "How many times in the past 24 hours?"      2-3 5. ONSET: "When did vomiting of blood begin?"     1 month ago 6. CAUSE: "What do you think is causing the vomiting of blood?"     unknown 7. BLOOD THINNERS: "Do you take any blood thinners?" (e.g., Coumadin/warfarin, Pradaxa/dabigatran, aspirin)    Low dose aspirin 8. DEHYDRATION: "Are there any signs of dehydration?" "When was the last time you urinated?" "Do you feel dizzy?"     No, bruising-occasionally weak 9. ABDOMINAL PAIN: "Are you having any abdominal pain?" If Yes, ask: "What does it feel like? " (e.g., crampy, dull, intermittent, constant)      Yes-stomach area- pt wont elaborate 10. DIARRHEA: "Is  there any diarrhea?" If Yes, ask: "How many times today?"        Diarrhea 1 time-brown- black colored stool 11. OTHER SYMPTOMS: "Do you have any other symptoms?" (e.g., fever, blood in stool)       Black stool 12. PREGNANCY: "Is there any chance you are pregnant?" "When was your last menstrual period?"       N/a  Protocols used: Vomiting Blood-A-AH

## 2021-07-05 NOTE — Discharge Instructions (Signed)
Please follow up with GI specialist and your neurologist.  Please return to the ER if you note black tarry stool or blood or if you have bloody vomit.  Return if you have worsening pain or trouble keeping food or liquids down.

## 2021-07-05 NOTE — ED Triage Notes (Signed)
Patient to ED via POV for left side facial numbness that started yesterday around 0730. Patient states the numbness "feels better but isn't gone." Patient also complaining of intermittent vomiting x1 month with last episode being this AM. Patient Aox4 and ambulatory to triage.

## 2021-07-05 NOTE — ED Provider Notes (Signed)
[facility] Emergency Department Provider Note    Event Date/Time   First MD Initiated Contact with Patient 07/05/21 1411     (approximate)  I have reviewed the triage vital signs and the nursing notes.   HISTORY  Chief Complaint Numbness and Emesis    HPI James Arnold is a 65 y.o. male with below listed past medical history presents to the ER for evaluation of numbness of the left side of his face that started yesterday.  States it was pretty dense numbness lasting a couple hours yesterday and has since significantly improved but still having some tingling sensation which is not back to baseline.  Also was concerned about several weeks of black appearing emesis and darker stools.  No black or bloody stool.  Had an episode this AM.  Not on any blood thinners other than a baby aspirin.  Does have a history of CVA.  Has not noted any hematochezia or hematemesis.  Is not been taking any NSAIDs.  Past Medical History:  Diagnosis Date   Anemia    Benign hypertensive renal disease    CAD (coronary artery disease)    a. 08/1996 s/p BMS to the mLAD ([facility]); b. 12/2017 MV: EF 46%, fixed apical defect w/ significant GI uptake artifact. No ischemia-> low risk; c. 07/2020 MV: EF 51%, no ischemia/infarct.   Carotid arterial disease ([place])    a. 11/2020 Carotid U/S: <50% bilat ICA stenoses.   CKD (chronic kidney disease), stage V ([place])    on peritoneal dialysis   Complication of anesthesia    please call by "[name]" when waking up!!   COPD (chronic obstructive pulmonary disease) (HCC)    centrilobular emphysema. pt is poorly controlled with his copd/smoking.   Depression    Diastolic dysfunction    a. 12/2017 Echo: EF 60-65%, no rwma, Gr1 DD, mild AI/MR. Nl RVSP; b. 08/2020 Echo: EF 60-65%, no rwma, GrI DD, nl RV fxn. RVSP 32.61mmHg.   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 12/2020 Zio: Sinus rhythm 80 (55-117). Rare PACs/PVCs. 17 episodes of SVT (longest 20.3 secs, fastest 184). No triggered events. No afib.   Smoker    Stroke [facility])    a. 11/2020 MRI Brain: patchy acute/early subacute cortical infarcts w/in the R frontal, parietal, and occiptal lobes. Small, chronic L basal ganglia lacunar infarct.   Thrombocytopenia ([place])    Family History  Problem Relation Age of Onset   Stroke Mother        Low BP stroke   Depression Father    Depression Sister        Breast   Heart disease Other    Breast cancer Other    Lung cancer Other    Ovarian cancer Other    Stomach cancer Other    Past Surgical History:  Procedure Laterality Date   ANGIOPLASTY     BONE MARROW ASPIRATION  03/2009   CARDIAC CATHETERIZATION  1997   stent x 1   COLONOSCOPY WITH PROPOFOL N/A 08/09/2018   Procedure: COLONOSCOPY WITH PROPOFOL;  Surgeon: [name], MD;  Location: [place];  Service: Gastroenterology;  Laterality: N/A;   CORONARY STENT PLACEMENT  08/1996   EXTERIORIZATION OF A CONTINUOUS AMBULATORY PERITONEAL DIALYSIS CATHETER     EYE SURGERY Bilateral    cats removed   REVERSE SHOULDER ARTHROPLASTY Left 11/19/2020   Procedure: Left reverse shoulder arthroplasty;  Surgeon: Leim Fabry, MD;  Location: [facility] ORS;  Service: Orthopedics;  Laterality: Left;   Patient Active Problem List   Diagnosis Date Noted   History of CVA (cerebrovascular accident) 03/14/2021   Aortic atherosclerosis ([place]) 12/12/2020   Anemia due to vitamin B12 deficiency    Acute CVA (cerebrovascular accident) ([place])    Anemia of chronic disease    Urinary retention    Focal seizure ([place]) 11/20/2020   Thrombocytopenia ([place]) 11/20/2020   ESRD (end stage renal disease) ([place])  11/20/2020   Hyperkalemia 11/20/2020   Leukocytosis 11/20/2020   ESRD on peritoneal dialysis ([place]) 11/20/2020   Status post shoulder replacement    Rotator cuff arthropathy 11/19/2020   Tobacco abuse 11/07/2020   Hypertensive heart and chronic kidney disease with heart failure and with stage 5 chronic kidney disease, or end stage renal disease ([place]) 07/10/2020   Dialysis patient ([place]) 01/09/2020   Senile purpura ([place]) 01/09/2020   Centrilobular emphysema ([place]) 09/13/2019   Secondary hyperparathyroidism ([place]) 03/14/2019   BPH (benign prostatic hyperplasia) 10/24/2018   Depression 10/24/2018   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   Anemia of chronic kidney failure, stage 5 ([place]) 12/30/2017   Proteinuria 11/25/2017   Gout 08/27/2017   Primary myelofibrosis ([place])    Major depression, recurrent (HCC)    Benign hypertensive renal disease    CKD (chronic kidney disease) stage 5, GFR less than 15 ml/min (HCC)    Hyperlipidemia LDL goal <70 10/23/2002   CAD (coronary artery disease) 08/22/1996      Prior to Admission medications   Medication Sig Start Date End Date Taking? Authorizing Provider  ondansetron (ZOFRAN ODT) 4 MG disintegrating tablet Take 1 tablet (4 mg total) by mouth every 8 (eight) hours as needed for nausea or vomiting. 07/05/21  Yes Merlyn Lot, MD  pantoprazole (PROTONIX) 20 MG tablet Take 1 tablet (20 mg total) by mouth daily. 07/05/21 07/05/22 Yes Merlyn Lot, MD  albuterol (VENTOLIN HFA) 108 (90 Base) MCG/ACT inhaler Inhale 2 puffs into the lungs every 6 (six) hours as needed for wheezing or shortness of breath. 02/27/21   Borders, Kirt Boys, NP  aspirin 81 MG chewable tablet Chew 1 tablet (81 mg total) by mouth daily. 01/08/21   Johnson, Megan P, DO  carvedilol (COREG) 25 MG tablet Take 1 tablet (25 mg total) by mouth 2 (two) times daily. 06/13/21 09/11/21  Theora Gianotti, NP  Cholecalciferol (VITAMIN D-1000 MAX ST) 25 MCG (1000 UT)  tablet Take 1,000 Units by mouth daily.     [provider]  citalopram (CELEXA) 40 MG tablet Take 1 tablet (40 mg total) by mouth daily. 01/08/21   Park Liter P, DO  ferrous sulfate 325 (65 FE) MG tablet Take 1 tablet (325 mg total) by mouth daily with breakfast. 12/30/17 08/06/23  Saundra Shelling, MD  furosemide (LASIX) 20 MG tablet Take 1 tablet (20 mg total) by mouth daily. 01/08/21   Park Liter P, DO  gentamicin cream (GARAMYCIN) 0.1 % Apply 1 application topically every other day. 03/18/19   [provider]  levETIRAcetam (KEPPRA) 500 MG tablet Take 1 tablet (500 mg total) by mouth at bedtime. 11/24/20   Loletha Grayer, MD  omeprazole ([place]) 20  MG capsule TAKE 1 CAPSULE EVERY DAY 06/13/21   Johnson, Megan P, DO  polyethylene glycol (MIRALAX / GLYCOLAX) 17 g packet Take 17 g by mouth daily as needed. 11/24/20   Loletha Grayer, MD  predniSONE (DELTASONE) 10 MG tablet Take 10 mg by mouth daily with breakfast.    [provider]    Allergies Patient has no known allergies.    Social History Social History   Tobacco Use   Smoking status: Every Day    Packs/day: 1.00    Years: 47.00    Pack years: 47.00    Types: Cigarettes   Smokeless tobacco: Never   Tobacco comments:    trying to cut back-smokes 6 to 7 cigs per day  Vaping Use   Vaping Use: Never used  Substance Use Topics   Alcohol use: Yes    Comment: "6 beers weekly on Saturdays"   Drug use: No    Review of Systems Patient denies headaches, rhinorrhea, blurry vision, numbness, shortness of breath, chest pain, edema, cough, abdominal pain, nausea, vomiting, diarrhea, dysuria, fevers, rashes or hallucinations unless otherwise stated above in HPI. ____________________________________________   PHYSICAL EXAM:  VITAL SIGNS: Vitals:   07/05/21 1219  BP: (!) 144/70  Pulse: 79  Resp: 18  Temp: 97.7 F (36.5 C)  SpO2: 95%    Constitutional: Alert and oriented.  Eyes: Conjunctivae  are normal.  Head: Atraumatic. Nose: No congestion/rhinnorhea. Mouth/Throat: Mucous membranes are moist.   Neck: No stridor. Painless ROM.  Cardiovascular: Normal rate, regular rhythm. Grossly normal heart sounds.  Good peripheral circulation. Respiratory: Normal respiratory effort.  No retractions. Lungs CTAB. Gastrointestinal: Soft and nontender in all four quadrants. No distention. No abdominal bruits. No CVA tenderness. Genitourinary: DRE with soft brown stool, guaiac negative. Musculoskeletal: No lower extremity tenderness nor edema.  No joint effusions. Neurologic:  CN- intact.  No facial droop, Normal FNF.  Normal heel to shin.  Sensation intact bilaterally. Normal speech and language. No gross focal neurologic deficits are appreciated. No gait instability. Skin:  Skin is warm, dry and intact. No rash noted. Psychiatric: Mood and affect are normal. Speech and behavior are normal.  ____________________________________________   LABS (all labs ordered are listed, but only abnormal results are displayed)  Results for orders placed or performed during the hospital encounter of 07/05/21 (from the past 24 hour(s))  Protime-INR     Status: None   Collection Time: 07/05/21 12:26 PM  Result Value Ref Range   Prothrombin Time 14.9 11.4 - 15.2 seconds   INR 1.2 0.8 - 1.2  APTT     Status: None   Collection Time: 07/05/21 12:26 PM  Result Value Ref Range   aPTT 30 24 - 36 seconds  CBC     Status: Abnormal   Collection Time: 07/05/21 12:26 PM  Result Value Ref Range   WBC 5.0 4.0 - 10.5 K/uL   RBC 3.99 (L) 4.22 - 5.81 MIL/uL   Hemoglobin 11.7 (L) 13.0 - 17.0 g/dL   HCT 35.9 (L) 39.0 - 52.0 %   MCV 90.0 80.0 - 100.0 fL   MCH 29.3 26.0 - 34.0 pg   MCHC 32.6 30.0 - 36.0 g/dL   RDW 15.3 11.5 - 15.5 %   Platelets 131 (L) 150 - 400 K/uL   nRBC 0.0 0.0 - 0.2 %  Differential     Status: Abnormal   Collection Time: 07/05/21 12:26 PM  Result Value Ref Range   Neutrophils Relative % 88 %  Neutro Abs 4.4 1.7 - 7.7 K/uL   Lymphocytes Relative 8 %   Lymphs Abs 0.4 (L) 0.7 - 4.0 K/uL   Monocytes Relative 3 %   Monocytes Absolute 0.2 0.1 - 1.0 K/uL   Eosinophils Relative 1 %   Eosinophils Absolute 0.0 0.0 - 0.5 K/uL   Basophils Relative 0 %   Basophils Absolute 0.0 0.0 - 0.1 K/uL   Immature Granulocytes 0 %   Abs Immature Granulocytes 0.02 0.00 - 0.07 K/uL  Comprehensive metabolic panel     Status: Abnormal   Collection Time: 07/05/21 12:26 PM  Result Value Ref Range   Sodium 140 135 - 145 mmol/L   Potassium 4.5 3.5 - 5.1 mmol/L   Chloride 103 98 - 111 mmol/L   CO2 24 22 - 32 mmol/L   Glucose, Bld 103 (H) 70 - 99 mg/dL   BUN 43 (H) 8 - 23 mg/dL   Creatinine, Ser 8.14 (H) 0.61 - 1.24 mg/dL   Calcium 8.1 (L) 8.9 - 10.3 mg/dL   Total Protein 6.0 (L) 6.5 - 8.1 g/dL   Albumin 3.3 (L) 3.5 - 5.0 g/dL   AST 14 (L) 15 - 41 U/L   ALT 11 0 - 44 U/L   Alkaline Phosphatase 50 38 - 126 U/L   Total Bilirubin 0.8 0.3 - 1.2 mg/dL   GFR, Estimated 7 (L) >60 mL/min   Anion gap 13 5 - 15   ____________________________________________  EKG My review and personal interpretation at Time: 12:25   Indication: weakness  Rate: 75  Rhythm: sinus Axis: normal Other: nonspecific st and t wave abn ____________________________________________  RADIOLOGY  I personally reviewed all radiographic images ordered to evaluate for the above acute complaints and reviewed radiology reports and findings.  These findings were personally discussed with the patient.  Please see medical record for radiology report.  ____________________________________________   PROCEDURES  Procedure(s) performed:  Procedures    Critical Care performed: no ____________________________________________   INITIAL IMPRESSION / ASSESSMENT AND PLAN / ED COURSE  Pertinent labs & imaging results that were available during my care of the patient were reviewed by me and considered in my medical decision making (see  chart for details).   DDX: cva, tia, electrolyte abn, anemia, ugib, lgib, mass, sbo  James Arnold is a 65 y.o. who presents to the ED with presentation as described above with concern for tingling sensation on the left face as well as multiple episodes of dark-colored emesis.  Is on Prilosec.  Exam otherwise reassuring.  Patient nontoxic-appearing neuro exam with subjective decreased sensation on the left as compared to the right.  Will order stroke work-up.  Case discussed with neurology and will order MRI to further eval.  CT head without acute findings.   Clinical Course as of 07/05/21 1853  Fri Jul 05, 2021  1515 Patient reported several weeks of dark emesis as well as dark stool.  His stool is soft brown and guaiac negative.  His hemoglobin is stable actually improved as compared to previous. [PR]  0998 Discussed his presentation  [PR]  1843 MRI and MRI without acute findings.  Appropriate for close outpatient follow-up with neurologist.  Discussed case with patient's wife who is states that her primary concern was the several weeks of dark vomit.  We discussed the reassuring blood work as well as is guaiac negative stool with no melena.  States he has been having some abdominal discomfort over the past several weeks and I recommended CT  to further evaluate but they state that he just had recent imaging his PD is functioning well this is been going on for several weeks and feel comfortable with close outpatient follow-up with GI.  We discussed strict return precautions.  Patient family agreeable to plan.  Abdomen [PR]    Clinical Course User Index [PR] Merlyn Lot, MD    The patient was evaluated in Emergency Department today for the symptoms described in the history of present illness. He/she was evaluated in the context of the global COVID-19 pandemic, which necessitated consideration that the patient might be at risk for infection with the SARS-CoV-2 virus that causes COVID-19.  Institutional protocols and algorithms that pertain to the evaluation of patients at risk for COVID-19 are in a state of rapid change based on information released by regulatory bodies including the CDC and federal and state organizations. These policies and algorithms were followed during the patient's care in the ED.  As part of my medical decision making, I reviewed the following data within the [place] notes reviewed and incorporated, Labs reviewed, notes from prior ED visits and [place] Controlled Substance Database   ____________________________________________   FINAL CLINICAL IMPRESSION(S) / ED DIAGNOSES  Final diagnoses:  Paresthesia  Nausea and vomiting, unspecified vomiting type      NEW MEDICATIONS STARTED DURING THIS VISIT:  New Prescriptions   ONDANSETRON (ZOFRAN ODT) 4 MG DISINTEGRATING TABLET    Take 1 tablet (4 mg total) by mouth every 8 (eight) hours as needed for nausea or vomiting.   PANTOPRAZOLE (PROTONIX) 20 MG TABLET    Take 1 tablet (20 mg total) by mouth daily.     Note:  This document was prepared using Dragon voice recognition software and may include unintentional dictation errors.    Merlyn Lot, MD 07/05/21 [phone]

## 2021-07-07 DIAGNOSIS — N186 End stage renal disease: Secondary | ICD-10-CM | POA: Diagnosis not present

## 2021-07-07 DIAGNOSIS — Z992 Dependence on renal dialysis: Secondary | ICD-10-CM | POA: Diagnosis not present

## 2021-07-08 DIAGNOSIS — N186 End stage renal disease: Secondary | ICD-10-CM | POA: Diagnosis not present

## 2021-07-08 DIAGNOSIS — Z992 Dependence on renal dialysis: Secondary | ICD-10-CM | POA: Diagnosis not present

## 2021-07-08 NOTE — Telephone Encounter (Signed)
Will discuss at his appt

## 2021-07-08 NOTE — Telephone Encounter (Signed)
He really needs to go to the ER. Can we reiterate that to him?

## 2021-07-08 NOTE — Telephone Encounter (Signed)
Patient's wife states they went to [facility] ER. She states patient went to the ER and his wife states she spoke with someone and was informed that they would contact a nurse to let them know he was on the way. When they got to the hospital no one had called, once he was there the nurse asked him if he had any picture of what he was throwing up. Patient wife states they did a lot of testing, but did not do any testing on his stomach. Patient was told there was nothing was wrong. Patient was told to stop taking Prilosec and gave him another medication to take. Patient wife states patient will be present at appointment with PCP on Wednesday. Please advise?

## 2021-07-09 DIAGNOSIS — N186 End stage renal disease: Secondary | ICD-10-CM | POA: Diagnosis not present

## 2021-07-09 DIAGNOSIS — Z992 Dependence on renal dialysis: Secondary | ICD-10-CM | POA: Diagnosis not present

## 2021-07-10 ENCOUNTER — Other Ambulatory Visit: Payer: Self-pay

## 2021-07-10 ENCOUNTER — Encounter: Payer: Self-pay | Admitting: Family Medicine

## 2021-07-10 ENCOUNTER — Ambulatory Visit (INDEPENDENT_AMBULATORY_CARE_PROVIDER_SITE_OTHER): Payer: Medicare HMO | Admitting: Family Medicine

## 2021-07-10 VITALS — BP 123/69 | HR 69 | Temp 97.6°F | Ht 67.01 in | Wt 154.6 lb

## 2021-07-10 DIAGNOSIS — Z992 Dependence on renal dialysis: Secondary | ICD-10-CM | POA: Diagnosis not present

## 2021-07-10 DIAGNOSIS — J432 Centrilobular emphysema: Secondary | ICD-10-CM | POA: Diagnosis not present

## 2021-07-10 DIAGNOSIS — R3911 Hesitancy of micturition: Secondary | ICD-10-CM | POA: Diagnosis not present

## 2021-07-10 DIAGNOSIS — I700 Atherosclerosis of aorta: Secondary | ICD-10-CM | POA: Diagnosis not present

## 2021-07-10 DIAGNOSIS — D471 Chronic myeloproliferative disease: Secondary | ICD-10-CM

## 2021-07-10 DIAGNOSIS — N185 Chronic kidney disease, stage 5: Secondary | ICD-10-CM | POA: Diagnosis not present

## 2021-07-10 DIAGNOSIS — N186 End stage renal disease: Secondary | ICD-10-CM

## 2021-07-10 DIAGNOSIS — D696 Thrombocytopenia, unspecified: Secondary | ICD-10-CM

## 2021-07-10 DIAGNOSIS — N2581 Secondary hyperparathyroidism of renal origin: Secondary | ICD-10-CM

## 2021-07-10 DIAGNOSIS — N401 Enlarged prostate with lower urinary tract symptoms: Secondary | ICD-10-CM

## 2021-07-10 DIAGNOSIS — Z0000 Encounter for general adult medical examination without abnormal findings: Secondary | ICD-10-CM | POA: Diagnosis not present

## 2021-07-10 DIAGNOSIS — I132 Hypertensive heart and chronic kidney disease with heart failure and with stage 5 chronic kidney disease, or end stage renal disease: Secondary | ICD-10-CM | POA: Diagnosis not present

## 2021-07-10 DIAGNOSIS — I129 Hypertensive chronic kidney disease with stage 1 through stage 4 chronic kidney disease, or unspecified chronic kidney disease: Secondary | ICD-10-CM | POA: Diagnosis not present

## 2021-07-10 DIAGNOSIS — F3342 Major depressive disorder, recurrent, in full remission: Secondary | ICD-10-CM | POA: Diagnosis not present

## 2021-07-10 DIAGNOSIS — R112 Nausea with vomiting, unspecified: Secondary | ICD-10-CM | POA: Diagnosis not present

## 2021-07-10 DIAGNOSIS — E785 Hyperlipidemia, unspecified: Secondary | ICD-10-CM | POA: Diagnosis not present

## 2021-07-10 DIAGNOSIS — D692 Other nonthrombocytopenic purpura: Secondary | ICD-10-CM

## 2021-07-10 LAB — URINALYSIS, ROUTINE W REFLEX MICROSCOPIC
Bilirubin, UA: NEGATIVE
Glucose, UA: NEGATIVE
Ketones, UA: NEGATIVE
Leukocytes,UA: NEGATIVE
Nitrite, UA: NEGATIVE
Specific Gravity, UA: 1.02 (ref 1.005–1.030)
Urobilinogen, Ur: 0.2 mg/dL (ref 0.2–1.0)
pH, UA: 7 (ref 5.0–7.5)

## 2021-07-10 LAB — MICROSCOPIC EXAMINATION
Bacteria, UA: NONE SEEN
Epithelial Cells (non renal): NONE SEEN /hpf (ref 0–10)
WBC, UA: NONE SEEN /hpf (ref 0–5)

## 2021-07-10 LAB — MICROALBUMIN, URINE WAIVED
Creatinine, Urine Waived: 50 mg/dL (ref 10–300)
Microalb, Ur Waived: 150 mg/L — ABNORMAL HIGH (ref 0–19)
Microalb/Creat Ratio: >300 mg/g — ABNORMAL HIGH (ref <30)

## 2021-07-10 MED ORDER — PANTOPRAZOLE SODIUM 40 MG PO TBEC
40.0000 mg | DELAYED_RELEASE_TABLET | Freq: Every day | ORAL | 1 refills | Status: DC
Start: 2021-07-10 — End: 2021-09-12

## 2021-07-10 MED ORDER — ONDANSETRON 4 MG PO TBDP: 4.0000 mg | ORAL_TABLET | Freq: Three times a day (TID) | ORAL | 0 refills | Status: DC | PRN

## 2021-07-10 NOTE — Progress Notes (Signed)
BP 123/69   Pulse 69   Temp 97.6 F (36.4 C) (Oral)   Ht 5' 7.01" (1.702 m)   Wt 154 lb 9.6 oz (70.1 kg)   SpO2 99%   BMI 24.21 kg/m    Subjective:    [name], male    DOB: 05-16-1956, 65 y.o.   MRN: [id number]  HPI: James Arnold is a 65 y.o. male presenting on 07/10/2021 for comprehensive medical examination. Current medical complaints include:  ABDOMINAL ISSUES- has been vomiting for about a month. Has been happening in the AM. Has been seeing dark vomit come up.  Duration: about month of vomiting Nature: soreness in his belly Location: epigastric  Severity: mild to moderate  Radiation: no Episode duration: 4-5 hours Frequency: intermittent Alleviating factors: nothing  Aggravating factors: nothing Treatments attempted: none Constipation: intermittent Diarrhea: yes Mucous in the stool: no Heartburn: no Bloating:no Flatulence: no Nausea: yes Vomiting: yes Melena or hematochezia: no Rash: no Jaundice: no Fever: no Weight loss: no  DEPRESSION Mood status: controlled Satisfied with current treatment?: yes Symptom severity: mild  Duration of current treatment : chronic Side effects: no Medication compliance: excellent compliance Psychotherapy/counseling: no  Previous psychiatric medications: celexa Depressed mood: no Anxious mood: no Anhedonia: no Significant weight loss or gain: no Insomnia: no  Fatigue: yes Feelings of worthlessness or guilt: no Impaired concentration/indecisiveness: no Suicidal ideations: no Hopelessness: no Crying spells: no Depression screen [facility] 2/9 07/11/2021 01/14/2021 11/16/2020 10/23/2020 07/10/2020  Decreased Interest 0 0 0 0 0  Down, Depressed, Hopeless 0 0 0 0 0  PHQ - 2 Score 0 0 0 0 0  Altered sleeping 0 - 0 0 0  Tired, decreased energy 2 - 3 3 0  Change in appetite 0 - 0 0 0  Feeling bad or failure about yourself  0 - 0 0 0  Trouble concentrating 0 - 0 0 0  Moving slowly or fidgety/restless 0 - 0 0 0   Suicidal thoughts 0 - 0 0 0  PHQ-9 Score 2 - 3 3 0  Difficult doing work/chores Not difficult at all - - Not difficult at all Not difficult at all  Some recent data might be hidden    HYPERTENSION / HYPERLIPIDEMIA Satisfied with current treatment? yes Duration of hypertension: chronic BP monitoring frequency: rarely BP medication side effects: no Past BP meds: carvedilol, lasix Duration of hyperlipidemia: chronic Cholesterol medication side effects: not on anything Cholesterol supplements: none Past cholesterol medications: none Medication compliance: excellent compliance Aspirin: yes Recent stressors: yes Recurrent headaches: no Visual changes: no Palpitations: no Dyspnea: no Chest pain: no Lower extremity edema: no Dizzy/lightheaded: no  He currently lives with: wife Interim Problems from his last visit: no  Depression Screen done today and results listed below:  Depression screen [facility] 2/9 07/11/2021 01/14/2021 11/16/2020 10/23/2020 07/10/2020  Decreased Interest 0 0 0 0 0  Down, Depressed, Hopeless 0 0 0 0 0  PHQ - 2 Score 0 0 0 0 0  Altered sleeping 0 - 0 0 0  Tired, decreased energy 2 - 3 3 0  Change in appetite 0 - 0 0 0  Feeling bad or failure about yourself  0 - 0 0 0  Trouble concentrating 0 - 0 0 0  Moving slowly or fidgety/restless 0 - 0 0 0  Suicidal thoughts 0 - 0 0 0  PHQ-9 Score 2 - 3 3 0  Difficult doing work/chores Not difficult at all - - Not difficult  at all Not difficult at all  Some recent data might be hidden    Past Medical History:  Past Medical History:  Diagnosis Date   Anemia    Benign hypertensive renal disease    CAD (coronary artery disease)    a. 08/1996 s/p BMS to the mLAD ([facility]); b. 12/2017 MV: EF 46%, fixed apical defect w/ significant GI uptake artifact. No ischemia-> low risk; c. 07/2020 MV: EF 51%, no ischemia/infarct.   Carotid arterial disease ([place])    a. 11/2020 Carotid U/S: <50% bilat ICA stenoses.   CKD (chronic kidney  disease), stage V ([place])    on peritoneal dialysis   Complication of anesthesia    please call by "[name]" when waking up!!   COPD (chronic obstructive pulmonary disease) (HCC)    centrilobular emphysema. pt is poorly controlled with his copd/smoking.   Depression    Diastolic dysfunction    a. 12/2017 Echo: EF 60-65%, no rwma, Gr1 DD, mild AI/MR. Nl RVSP; b. 08/2020 Echo: EF 60-65%, no rwma, GrI DD, nl RV fxn. RVSP 32.26mHg.   Dyspnea on exertion    with exertion   Fatigue    FSGS (focal segmental glomerulosclerosis)    Hyperlipidemia 10/2002   Hypertension    myelofibrosis    bone marrow failure   [place]   JAK2 (+) primary   [place]ocardial [place]   stent x 1   Pneumoperitoneum 02/2021   PS[place] ([place]ox[place]ricular tachycardia) [place]    a. 12/2020 Zio: Sinus rhythm 80 (55-117). Rare PACs/PVCs. 17 episodes of SVT (longest 20.3 secs, fastest 184). No triggered events. No afib.   Smoker    Stroke ([facility]    a. 11/2020 MRI Brain: patchy acute/early subacute cortical infarcts w/in the R frontal, parietal, and occiptal lobes. Small, chronic L basal ganglia lacunar infarct.   Thrombocytopenia ([facility]     Surgical History:  Past Surgical History:  Procedure Laterality Date   ANGIOPLASTY     BONE MARROW ASPIRATION  03/2009   CARDIAC CATHETERIZATION  1997   stent x 1   COLONOSCOPY WITH PROPOFOL N/A 08/09/2018   Procedure: COLONOSCOPY WITH PROPOFOL;  Surgeon: AJonathon Bellows MD;  [facility];  Service: Gastroenterology;  Laterality: N/A;   CORONARY STENT PLACEMENT  08/1996   EXTERIORIZATION OF A CONTINUOUS AMBULATORY PERITONEAL DIALYSIS CATHETER     EYE SURGERY Bilateral    cats removed   REVERSE SHOULDER ARTHROPLASTY Left 11/19/2020   Procedure: Left reverse shoulder arthroplasty;  Surgeon: PLeim Fabry MD;  Location: [facility] ORS;  Service: Orthopedics;  Laterality: Left;    Medications:  Current Outpatient Medications on File Prior to Visit   Medication Sig   albuterol (VENTOLIN HFA) 108 (90 Base) MCG/ACT inhaler Inhale 2 puffs into the lungs every 6 (six) hours as needed for wheezing or shortness of breath.   aspirin 81 MG chewable tablet Chew 1 tablet (81 mg total) by mouth daily.   carvedilol (COREG) 25 MG tablet Take 1 tablet (25 mg total) by mouth 2 (two) times daily.   Cholecalciferol (VITAMIN D-1000 MAX ST) 25 MCG (1000 UT) tablet Take 1,000 Units by mouth daily.    citalopram (CELEXA) 40 MG tablet Take 1 tablet (40 mg total) by mouth daily.   ferrous sulfate 325 (65 FE) MG tablet Take 1 tablet (325 mg total) by mouth daily with breakfast.   furosemide (LASIX) 20 MG tablet Take 1 tablet (20 mg total) by mouth daily.   gentamicin cream (GARAMYCIN) 0.1 %  Apply 1 application topically every other day.   levETIRAcetam (KEPPRA) 500 MG tablet Take 1 tablet (500 mg total) by mouth at bedtime.   polyethylene glycol (MIRALAX / GLYCOLAX) 17 g packet Take 17 g by mouth daily as needed.   predniSONE (DELTASONE) 10 MG tablet Take 10 mg by mouth daily with breakfast.   No current facility-administered medications on file prior to visit.    Allergies:  No Known Allergies  Social History:  Social History   Socioeconomic History   Marital status: Married    Spouse name: [name]   Number of children: Not on file   Years of education: Not on file   Highest education level: Not on file  Occupational History   Occupation: dt myelofibrosis    Comment: disabled since 2010  Tobacco Use   Smoking status: Every Day    Packs/day: 1.00    Years: 47.00    Pack years: 47.00    Types: Cigarettes   Smokeless tobacco: Never   Tobacco comments:    trying to cut back-smokes 6 to 7 cigs per day  Vaping Use   Vaping Use: Never used  Substance and Sexual Activity   Alcohol use: Yes    Comment: "6 beers weekly on Saturdays"   Drug use: No   Sexual activity: Yes  Other Topics Concern   Not on file  Social History Narrative   Patient  lives with wife in his home.   Not working dt disability.    Has been placed on kidney transplant evaluation list, but does not qualify d/t smoking and myelofibrosis   Social Determinants of Health   Financial Resource Strain: Low Risk    Difficulty of Paying Living Expenses: Not hard at all  Food Insecurity: No Food Insecurity   Worried About Charity fundraiser in the Last Year: Never true   Ran Out of Food in the Last Year: Never true  Transportation Needs: No Transportation Needs   Lack of Transportation (Medical): No   Lack of Transportation (Non-Medical): No  Physical Activity: Inactive   Days of Exercise per Week: 0 days   Minutes of Exercise per Session: 0 min  Stress: No Stress Concern Present   Feeling of Stress : Not at all  Social Connections: Not on file  Intimate Partner Violence: Not on file   Social History   Tobacco Use  Smoking Status Every Day   Packs/day: 1.00   Years: 47.00   Pack years: 47.00   Types: Cigarettes  Smokeless Tobacco Never  Tobacco Comments   trying to cut back-smokes 6 to 7 cigs per day   Social History   Substance and Sexual Activity  Alcohol Use Yes   Comment: "6 beers weekly on Saturdays"    Family History:  Family History  Problem Relation Age of Onset   Stroke Mother        Low BP stroke   Depression Father    Depression Sister        Breast   Heart disease Other    Breast cancer Other    Lung cancer Other    Ovarian cancer Other    Stomach cancer Other     Past medical history, surgical history, medications, allergies, family history and social history reviewed with patient today and changes made to appropriate areas of the chart.   Review of Systems  Constitutional:  Positive for malaise/fatigue. Negative for chills, diaphoresis, fever and weight loss.  HENT: Negative.  Eyes: Negative.   Respiratory: Negative.    Cardiovascular: Negative.   Gastrointestinal:  Positive for abdominal pain, constipation,  diarrhea, nausea and vomiting. Negative for blood in stool, heartburn and melena.  Genitourinary: Negative.   Musculoskeletal: Negative.   Skin: Negative.   Neurological:  Positive for tingling. Negative for dizziness, tremors, sensory change, speech change, focal weakness, seizures, loss of consciousness, weakness and headaches.  Endo/Heme/Allergies: Negative.   Psychiatric/Behavioral: Negative.    All other ROS negative except what is listed above and in the HPI.      Objective:    BP 123/69   Pulse 69   Temp 97.6 F (36.4 C) (Oral)   Ht 5' 7.01" (1.702 m)   Wt 154 lb 9.6 oz (70.1 kg)   SpO2 99%   BMI 24.21 kg/m   Wt Readings from Last 3 Encounters:  07/10/21 154 lb 9.6 oz (70.1 kg)  07/05/21 147 lb (66.7 kg)  06/26/21 156 lb (70.8 kg)    Physical Exam Vitals and nursing note reviewed.  Constitutional:      General: He is not in acute distress.    Appearance: Normal appearance. He is obese. He is not ill-appearing, toxic-appearing or diaphoretic.  HENT:     Head: Normocephalic and atraumatic.     Right Ear: Tympanic membrane, ear canal and external ear normal. There is no impacted cerumen.     Left Ear: Tympanic membrane, ear canal and external ear normal. There is no impacted cerumen.     Nose: Nose normal. No congestion or rhinorrhea.     Mouth/Throat:     Mouth: Mucous membranes are moist.     Pharynx: Oropharynx is clear. No oropharyngeal exudate or posterior oropharyngeal erythema.  Eyes:     General: No scleral icterus.       Right eye: No discharge.        Left eye: No discharge.     Extraocular Movements: Extraocular movements intact.     Conjunctiva/sclera: Conjunctivae normal.     Pupils: Pupils are equal, round, and reactive to light.  Neck:     Vascular: No carotid bruit.  Cardiovascular:     Rate and Rhythm: Normal rate and regular rhythm.     Pulses: Normal pulses.     Heart sounds: No murmur heard.   No friction rub. No gallop.  Pulmonary:      Effort: Pulmonary effort is normal. No respiratory distress.     Breath sounds: Normal breath sounds. No stridor. No wheezing, rhonchi or rales.  Chest:     Chest wall: No tenderness.  Abdominal:     General: Abdomen is flat. Bowel sounds are normal. There is no distension.     Palpations: Abdomen is soft. There is no mass.     Tenderness: There is no abdominal tenderness. There is no right CVA tenderness, left CVA tenderness, guarding or rebound.     Hernia: No hernia is present.  Genitourinary:    Comments: Genital exam deferred with shared decision making Musculoskeletal:        General: No swelling, tenderness, deformity or signs of injury.     Cervical back: Normal range of motion and neck supple. No rigidity. No muscular tenderness.     Right lower leg: No edema.     Left lower leg: No edema.  Lymphadenopathy:     Cervical: No cervical adenopathy.  Skin:    General: Skin is warm and dry.     Capillary Refill: Capillary refill takes  less than 2 seconds.     Coloration: Skin is not jaundiced or pale.     Findings: No bruising, erythema, lesion or rash.  Neurological:     General: No focal deficit present.     Mental Status: He is alert and oriented to person, place, and time.     Cranial Nerves: No cranial nerve deficit.     Sensory: No sensory deficit.     Motor: No weakness.     Coordination: Coordination normal.     Gait: Gait normal.     Deep Tendon Reflexes: Reflexes normal.  Psychiatric:        Mood and Affect: Mood normal.        Behavior: Behavior normal.        Thought Content: Thought content normal.        Judgment: Judgment normal.    Results for orders placed or performed in visit on 07/10/21  Microscopic Examination   Urine  Result Value Ref Range   WBC, UA None seen 0 - 5 /hpf   RBC 0-2 0 - 2 /hpf   Epithelial Cells (non renal) None seen 0 - 10 /hpf   Mucus, UA Present (A) Not Estab.   Bacteria, UA None seen None seen/Few  Comprehensive metabolic  panel  Result Value Ref Range   Glucose 75 70 - 99 mg/dL   BUN 41 (H) 8 - 27 mg/dL   Creatinine, Ser 7.04 (H) 0.76 - 1.27 mg/dL   eGFR 8 (L) >59 mL/min/1.73   BUN/Creatinine Ratio 6 (L) 10 - 24   Sodium 140 134 - 144 mmol/L   Potassium 4.9 3.5 - 5.2 mmol/L   Chloride 102 96 - 106 mmol/L   CO2 20 20 - 29 mmol/L   Calcium 8.2 (L) 8.6 - 10.2 mg/dL   Total Protein 5.5 (L) 6.0 - 8.5 g/dL   Albumin 3.6 (L) 3.8 - 4.8 g/dL   Globulin, Total 1.9 1.5 - 4.5 g/dL   Albumin/Globulin Ratio 1.9 1.2 - 2.2   Bilirubin Total 0.3 0.0 - 1.2 mg/dL   Alkaline Phosphatase 62 44 - 121 IU/L   AST 12 0 - 40 IU/L   ALT 10 0 - 44 IU/L  CBC with Differential/Platelet  Result Value Ref Range   WBC 4.9 3.4 - 10.8 x10E3/uL   RBC 3.95 (L) 4.14 - 5.80 x10E6/uL   Hemoglobin 11.4 (L) 13.0 - 17.7 g/dL   Hematocrit 35.7 (L) 37.5 - 51.0 %   MCV 90 79 - 97 fL   MCH 28.9 26.6 - 33.0 pg   MCHC 31.9 31.5 - 35.7 g/dL   RDW 14.7 11.6 - 15.4 %   Platelets 139 (L) 150 - 450 x10E3/uL   Neutrophils 88 Not Estab. %   Lymphs 8 Not Estab. %   Monocytes 4 Not Estab. %   Eos 0 Not Estab. %   Basos 0 Not Estab. %   Neutrophils Absolute 4.3 1.4 - 7.0 x10E3/uL   Lymphocytes Absolute 0.4 (L) 0.7 - 3.1 x10E3/uL   Monocytes Absolute 0.2 0.1 - 0.9 x10E3/uL   EOS (ABSOLUTE) 0.0 0.0 - 0.4 x10E3/uL   Basophils Absolute 0.0 0.0 - 0.2 x10E3/uL   Immature Granulocytes 0 Not Estab. %   Immature Grans (Abs) 0.0 0.0 - 0.1 x10E3/uL  Lipid Panel w/o Chol/HDL Ratio  Result Value Ref Range   Cholesterol, Total 160 100 - 199 mg/dL   Triglycerides 120 0 - 149 mg/dL   HDL 28 (L) >39 mg/dL  VLDL Cholesterol Cal 22 5 - 40 mg/dL   LDL Chol Calc (NIH) 110 (H) 0 - 99 mg/dL  PSA  Result Value Ref Range   Prostate Specific Ag, Serum 0.4 0.0 - 4.0 ng/mL  TSH  Result Value Ref Range   TSH 1.760 0.450 - 4.500 uIU/mL  Urinalysis, Routine w reflex microscopic  Result Value Ref Range   Specific Gravity, UA 1.020 1.005 - 1.030   pH, UA 7.0 5.0 -  7.5   Color, UA Yellow Yellow   Appearance Ur Clear Clear   Leukocytes,UA Negative Negative   Protein,UA 2+ (A) Negative/Trace   Glucose, UA Negative Negative   Ketones, UA Negative Negative   RBC, UA Trace (A) Negative   Bilirubin, UA Negative Negative   Urobilinogen, Ur 0.2 0.2 - 1.0 mg/dL   Nitrite, UA Negative Negative   Microscopic Examination See below:   Microalbumin, Urine Waived  Result Value Ref Range   Microalb, Ur Waived 150 (H) 0 - 19 mg/L   Creatinine, Urine Waived 50 10 - 300 mg/dL   Microalb/Creat Ratio >300 (H) <30 mg/g      Assessment & Plan:   Problem List Items Addressed This Visit       Cardiovascular and Mediastinum   Senile purpura ([place])    Reassured patient. Continue to monitor. Labs drawn today.      Hypertensive heart and chronic kidney disease with heart failure and with stage 5 chronic kidney disease, or end stage renal disease ([place])    Under good control on current regimen. Continue current regimen. Continue to monitor. Call with any concerns. Refills given. Labs drawn today.       Relevant Orders   Comprehensive metabolic panel (Completed)   CBC with Differential/Platelet (Completed)   TSH (Completed)   Urinalysis, Routine w reflex microscopic (Completed)   Microalbumin, Urine Waived (Completed)   Aortic atherosclerosis (HCC)    Will keep BP and cholesterol under good control. Continue to monitor. Call with any concerns.       Relevant Orders   Comprehensive metabolic panel (Completed)   CBC with Differential/Platelet (Completed)   TSH (Completed)     Respiratory   Centrilobular emphysema (HCC)    Under good control on current regimen. Continue current regimen. Continue to monitor. Call with any concerns. Refills given. Labs drawn today.         Endocrine   Secondary hyperparathyroidism ([place])    Continue to follow with nephrology. Call with any concerns. Continue to monitor.         Genitourinary   Benign hypertensive renal  disease    Under good control on current regimen. Continue current regimen. Continue to monitor. Call with any concerns. Refills given. Labs drawn today.       Relevant Orders   Comprehensive metabolic panel (Completed)   CBC with Differential/Platelet (Completed)   TSH (Completed)   Urinalysis, Routine w reflex microscopic (Completed)   CKD (chronic kidney disease) stage 5, GFR less than 15 ml/min (HCC)    Rechecking labs today. Await results. Treat as needed.       BPH (benign prostatic hyperplasia)    Under good control on current regimen. Continue current regimen. Continue to monitor. Call with any concerns. Refills given. Labs drawn today.       Relevant Orders   PSA (Completed)   ESRD (end stage renal disease) ([place])    Rechecking labs today. Await results. Treat as needed.  Hematopoietic and Hemostatic   Thrombocytopenia ([place])    Rechecking labs today. Await results. Treat as needed.         Other   Primary myelofibrosis ([place])    Continue to follow with oncology. Call with any concerns. Continue to monitor.       Relevant Medications   ondansetron (ZOFRAN ODT) 4 MG disintegrating tablet   Hyperlipidemia LDL goal <70    Rechecking labs today. Await results. Treat as needed.       Relevant Orders   Comprehensive metabolic panel (Completed)   CBC with Differential/Platelet (Completed)   Lipid Panel w/o Chol/HDL Ratio (Completed)   TSH (Completed)   Major depression, recurrent (HCC)    Under good control on current regimen. Continue current regimen. Continue to monitor. Call with any concerns. Refills given.        Relevant Orders   Comprehensive metabolic panel (Completed)   CBC with Differential/Platelet (Completed)   TSH (Completed)   Other Visit Diagnoses     Routine general medical examination at a health care facility    -  Primary   Vaccines declined. Screening labs checked today. Colonoscopy up to date. Continue to monitor. Call with any  concerns. Continue to monitor.    Nausea and vomiting, unspecified vomiting type       Will start pantoprazole and get into see GI. Referral generated today. Call with any concerns.    Relevant Orders   Ambulatory referral to Gastroenterology   Comprehensive metabolic panel (Completed)   CBC with Differential/Platelet (Completed)   TSH (Completed)        Discussed aspirin prophylaxis for myocardial infarction prevention and decision was made to continue ASA  LABORATORY TESTING:  Health maintenance labs ordered today as discussed above.   The natural history of prostate cancer and ongoing controversy regarding screening and potential treatment outcomes of prostate cancer has been discussed with the patient. The meaning of a false positive PSA and a false negative PSA has been discussed. He indicates understanding of the limitations of this screening test and wishes  to proceed with screening PSA testing.   IMMUNIZATIONS:   - Tdap: Tetanus vaccination status reviewed: last tetanus booster within 10 years. - Influenza: Refused - Pneumovax: Refused - Prevnar: Refused - COVID: Refused - Shingrix vaccine: Refused  SCREENING: - Colonoscopy: Up to date  Discussed with patient purpose of the colonoscopy is to detect colon cancer at curable precancerous or early stages   PATIENT COUNSELING:    Sexuality: Discussed sexually transmitted diseases, partner selection, use of condoms, avoidance of unintended pregnancy  and contraceptive alternatives.   Advised to avoid cigarette smoking.  I discussed with the patient that most people either abstain from alcohol or drink within safe limits (<=14/week and <=4 drinks/occasion for males, <=7/weeks and <= 3 drinks/occasion for females) and that the risk for alcohol disorders and other health effects rises proportionally with the number of drinks per week and how often a drinker exceeds daily limits.  Discussed cessation/primary prevention of drug  use and availability of treatment for abuse.   Diet: Encouraged to adjust caloric intake to maintain  or achieve ideal body weight, to reduce intake of dietary saturated fat and total fat, to limit sodium intake by avoiding high sodium foods and not adding table salt, and to maintain adequate dietary potassium and calcium preferably from fresh fruits, vegetables, and low-fat dairy products.    stressed the importance of regular exercise  Injury prevention: Discussed safety belts, safety helmets,  smoke detector, smoking near bedding or upholstery.   Dental health: Discussed importance of regular tooth brushing, flossing, and dental visits.   Follow up plan: NEXT PREVENTATIVE PHYSICAL DUE IN 1 YEAR. Return in about 6 weeks (around 08/21/2021).

## 2021-07-11 DIAGNOSIS — N186 End stage renal disease: Secondary | ICD-10-CM | POA: Diagnosis not present

## 2021-07-11 DIAGNOSIS — Z992 Dependence on renal dialysis: Secondary | ICD-10-CM | POA: Diagnosis not present

## 2021-07-11 LAB — CBC WITH DIFFERENTIAL/PLATELET
Basophils Absolute: 0 10*3/uL (ref 0.0–0.2)
Basos: 0 %
EOS (ABSOLUTE): 0 10*3/uL (ref 0.0–0.4)
Eos: 0 %
Hematocrit: 35.7 % — ABNORMAL LOW (ref 37.5–51.0)
Hemoglobin: 11.4 g/dL — ABNORMAL LOW (ref 13.0–17.7)
Immature Grans (Abs): 0 10*3/uL (ref 0.0–0.1)
Immature Granulocytes: 0 %
Lymphocytes Absolute: 0.4 10*3/uL — ABNORMAL LOW (ref 0.7–3.1)
Lymphs: 8 %
MCH: 28.9 pg (ref 26.6–33.0)
MCHC: 31.9 g/dL (ref 31.5–35.7)
MCV: 90 fL (ref 79–97)
Monocytes Absolute: 0.2 10*3/uL (ref 0.1–0.9)
Monocytes: 4 %
Neutrophils Absolute: 4.3 10*3/uL (ref 1.4–7.0)
Neutrophils: 88 %
Platelets: 139 10*3/uL — ABNORMAL LOW (ref 150–450)
RBC: 3.95 x10E6/uL — ABNORMAL LOW (ref 4.14–5.80)
RDW: 14.7 % (ref 11.6–15.4)
WBC: 4.9 10*3/uL (ref 3.4–10.8)

## 2021-07-11 LAB — LIPID PANEL W/O CHOL/HDL RATIO
Cholesterol, Total: 160 mg/dL (ref 100–199)
HDL: 28 mg/dL — ABNORMAL LOW (ref >39)
LDL Chol Calc (NIH): 110 mg/dL — ABNORMAL HIGH (ref 0–99)
Triglycerides: 120 mg/dL (ref 0–149)
VLDL Cholesterol Cal: 22 mg/dL (ref 5–40)

## 2021-07-11 LAB — COMPREHENSIVE METABOLIC PANEL
ALT: 10 IU/L (ref 0–44)
AST: 12 IU/L (ref 0–40)
Albumin/Globulin Ratio: 1.9 (ref 1.2–2.2)
Albumin: 3.6 g/dL — ABNORMAL LOW (ref 3.8–4.8)
Alkaline Phosphatase: 62 IU/L (ref 44–121)
BUN/Creatinine Ratio: 6 — ABNORMAL LOW (ref 10–24)
BUN: 41 mg/dL — ABNORMAL HIGH (ref 8–27)
Bilirubin Total: 0.3 mg/dL (ref 0.0–1.2)
CO2: 20 mmol/L (ref 20–29)
Calcium: 8.2 mg/dL — ABNORMAL LOW (ref 8.6–10.2)
Chloride: 102 mmol/L (ref 96–106)
Creatinine, Ser: 7.04 mg/dL — ABNORMAL HIGH (ref 0.76–1.27)
Globulin, Total: 1.9 g/dL (ref 1.5–4.5)
Glucose: 75 mg/dL (ref 70–99)
Potassium: 4.9 mmol/L (ref 3.5–5.2)
Sodium: 140 mmol/L (ref 134–144)
Total Protein: 5.5 g/dL — ABNORMAL LOW (ref 6.0–8.5)
eGFR: 8 mL/min/{1.73_m2} — ABNORMAL LOW (ref >59)

## 2021-07-11 LAB — TSH: TSH: 1.76 u[IU]/mL (ref 0.450–4.500)

## 2021-07-11 LAB — PSA: Prostate Specific Ag, Serum: 0.4 ng/mL (ref 0.0–4.0)

## 2021-07-11 NOTE — Assessment & Plan Note (Signed)
Rechecking labs today. Await results. Treat as needed.  °

## 2021-07-11 NOTE — Assessment & Plan Note (Signed)
Under good control on current regimen. Continue current regimen. Continue to monitor. Call with any concerns. Refills given.   

## 2021-07-11 NOTE — Assessment & Plan Note (Signed)
Under good control on current regimen. Continue current regimen. Continue to monitor. Call with any concerns. Refills given. Labs drawn today.   

## 2021-07-11 NOTE — Assessment & Plan Note (Signed)
Continue to follow with oncology. Call with any concerns. Continue to monitor.  

## 2021-07-11 NOTE — Assessment & Plan Note (Signed)
Will keep BP and cholesterol under good control. Continue to monitor. Call with any concerns.  

## 2021-07-11 NOTE — Assessment & Plan Note (Signed)
Reassured patient. Continue to monitor. Labs drawn today.

## 2021-07-11 NOTE — Assessment & Plan Note (Signed)
Continue to follow with nephrology. Call with any concerns. Continue to monitor.  

## 2021-07-12 DIAGNOSIS — Z992 Dependence on renal dialysis: Secondary | ICD-10-CM | POA: Diagnosis not present

## 2021-07-12 DIAGNOSIS — N186 End stage renal disease: Secondary | ICD-10-CM | POA: Diagnosis not present

## 2021-07-14 DIAGNOSIS — N186 End stage renal disease: Secondary | ICD-10-CM | POA: Diagnosis not present

## 2021-07-14 DIAGNOSIS — Z992 Dependence on renal dialysis: Secondary | ICD-10-CM | POA: Diagnosis not present

## 2021-07-15 DIAGNOSIS — N186 End stage renal disease: Secondary | ICD-10-CM | POA: Diagnosis not present

## 2021-07-15 DIAGNOSIS — Z992 Dependence on renal dialysis: Secondary | ICD-10-CM | POA: Diagnosis not present

## 2021-07-16 DIAGNOSIS — Z992 Dependence on renal dialysis: Secondary | ICD-10-CM | POA: Diagnosis not present

## 2021-07-16 DIAGNOSIS — N186 End stage renal disease: Secondary | ICD-10-CM | POA: Diagnosis not present

## 2021-07-17 DIAGNOSIS — N186 End stage renal disease: Secondary | ICD-10-CM | POA: Diagnosis not present

## 2021-07-17 DIAGNOSIS — Z992 Dependence on renal dialysis: Secondary | ICD-10-CM | POA: Diagnosis not present

## 2021-07-18 DIAGNOSIS — Z992 Dependence on renal dialysis: Secondary | ICD-10-CM | POA: Diagnosis not present

## 2021-07-18 DIAGNOSIS — N186 End stage renal disease: Secondary | ICD-10-CM | POA: Diagnosis not present

## 2021-07-19 ENCOUNTER — Telehealth: Payer: Self-pay

## 2021-07-19 DIAGNOSIS — N186 End stage renal disease: Secondary | ICD-10-CM | POA: Diagnosis not present

## 2021-07-19 DIAGNOSIS — Z992 Dependence on renal dialysis: Secondary | ICD-10-CM | POA: Diagnosis not present

## 2021-07-19 NOTE — Telephone Encounter (Signed)
PA for Pantoprazole initiated and submitted via Cover My Meds. Key: [id number]

## 2021-07-21 DIAGNOSIS — Z20822 Contact with and (suspected) exposure to covid-19: Secondary | ICD-10-CM | POA: Diagnosis not present

## 2021-07-21 DIAGNOSIS — D7581 Myelofibrosis: Secondary | ICD-10-CM | POA: Diagnosis not present

## 2021-07-21 DIAGNOSIS — R195 Other fecal abnormalities: Secondary | ICD-10-CM | POA: Diagnosis not present

## 2021-07-21 DIAGNOSIS — R053 Chronic cough: Secondary | ICD-10-CM | POA: Diagnosis not present

## 2021-07-21 DIAGNOSIS — Z992 Dependence on renal dialysis: Secondary | ICD-10-CM | POA: Diagnosis not present

## 2021-07-21 DIAGNOSIS — N186 End stage renal disease: Secondary | ICD-10-CM | POA: Diagnosis not present

## 2021-07-21 DIAGNOSIS — K922 Gastrointestinal hemorrhage, unspecified: Secondary | ICD-10-CM | POA: Diagnosis not present

## 2021-07-21 DIAGNOSIS — Z8673 Personal history of transient ischemic attack (TIA), and cerebral infarction without residual deficits: Secondary | ICD-10-CM | POA: Diagnosis not present

## 2021-07-21 DIAGNOSIS — R0602 Shortness of breath: Secondary | ICD-10-CM | POA: Diagnosis not present

## 2021-07-22 DIAGNOSIS — K922 Gastrointestinal hemorrhage, unspecified: Secondary | ICD-10-CM | POA: Diagnosis not present

## 2021-07-22 DIAGNOSIS — N186 End stage renal disease: Secondary | ICD-10-CM | POA: Diagnosis not present

## 2021-07-22 DIAGNOSIS — Z992 Dependence on renal dialysis: Secondary | ICD-10-CM | POA: Diagnosis not present

## 2021-07-22 NOTE — Telephone Encounter (Signed)
Copied from [place] [id number]. Topic: General - Other >> Jul 22, 2021  9:44 AM Loma Boston wrote: [phone]  FU for Dr Wynetta Emery . Pt is progressively getting worse with GI issues. Went to  see dr at [facility] this weekend, states recommend to PCP vs Dr Verl Blalock GI can not see for month.  [facility] states needs to have an  Endoscopy asap Feels  could be stomach cancer/ father died with stomach cancer, told spouse they are advising  Dr Wynetta Emery advised to go ahead and order endoscopy now vs waiting  GI / Dr Marthe Patch with [name]/wife asap

## 2021-07-22 NOTE — Telephone Encounter (Signed)
Routing to provider to advise.  

## 2021-07-22 NOTE — Telephone Encounter (Signed)
Secure chat sent to Dr. Vicente Males who did his colonoscopy in 2019. Will see him in his office tomorrow and scope him on Thursday. Please make patient aware.

## 2021-07-23 ENCOUNTER — Other Ambulatory Visit: Payer: Self-pay

## 2021-07-23 ENCOUNTER — Ambulatory Visit (INDEPENDENT_AMBULATORY_CARE_PROVIDER_SITE_OTHER): Payer: Medicare HMO | Admitting: Gastroenterology

## 2021-07-23 ENCOUNTER — Encounter: Payer: Self-pay | Admitting: Gastroenterology

## 2021-07-23 VITALS — BP 117/64 | HR 77 | Temp 97.7°F | Ht 67.0 in | Wt 146.4 lb

## 2021-07-23 DIAGNOSIS — K920 Hematemesis: Secondary | ICD-10-CM

## 2021-07-23 DIAGNOSIS — Z992 Dependence on renal dialysis: Secondary | ICD-10-CM | POA: Diagnosis not present

## 2021-07-23 DIAGNOSIS — N186 End stage renal disease: Secondary | ICD-10-CM | POA: Diagnosis not present

## 2021-07-23 NOTE — Telephone Encounter (Signed)
Spoke to Dr. Wynetta Emery in person. States that Dr. Vicente Males said that he would see the patient in office today at 2 pm.   Called and notified patient's wife.

## 2021-07-23 NOTE — Progress Notes (Signed)
James Bellows MD, MRCP(U.K) [address]  [place]  [place], [place] [address]  Main: [phone]  Fax: [phone]   Gastroenterology Consultation  Referring Provider:     Valerie Roys, DO Primary Care Physician:  Valerie Roys, DO Primary Gastroenterologist:  Dr. Jonathon Arnold  Reason for Consultation:   Hematemesis        HPI:   James Arnold is a 65 y.o. y/o male referred for consultation & management  by Dr. Wynetta Emery, Megan P, DO.    He is here today to see me for hematemesis.  He was seen at the ER at [facility] on 07/21/2021 for throwing up coffee-ground material for over a month.  He states that he wakes up in the morning and throws up.  Denies any heartburn as such.  Some abdominal cramping at times.  It appears that he has been on some form of a PPI in the past.  He was unclear of the dosage.  He is a smoker.  Father had gastric cancer.  He has been having dark-colored stools for a few months.  Denies any NSAID use.  He dialysis himself at home.  Hemoglobin over 11 g.  No significant elevation in BUN/creatinine ratio.  Not on any blood thinners.  Past Medical History:  Diagnosis Date   Anemia    Benign hypertensive renal disease    CAD (coronary artery disease)    a. 08/1996 s/p BMS to the mLAD ([facility]); b. 12/2017 MV: EF 46%, fixed apical defect w/ significant GI uptake artifact. No ischemia-> low risk; c. 07/2020 MV: EF 51%, no ischemia/infarct.   Carotid arterial disease ([place])    a. 11/2020 Carotid U/S: <50% bilat ICA stenoses.   CKD (chronic kidney disease), stage V ([place])    on peritoneal dialysis   Complication of anesthesia    please call by "[name]" when waking up!!   COPD (chronic obstructive pulmonary disease) (HCC)    centrilobular emphysema. pt is poorly controlled with his copd/smoking.   Depression    Diastolic dysfunction    a. 12/2017 Echo: EF 60-65%, no rwma, Gr1 DD, mild AI/MR. Nl RVSP; b. 08/2020 Echo: EF 60-65%, no rwma, GrI DD, nl RV fxn. RVSP  32.49mmHg.   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 12/2020 [place]io: Sinus rhythm 80 (55-117). Rare PACs/PVCs. 17 episodes of SVT (longest 20.3 secs, fastest 184). No triggered events. No afib.   Smoker    Stroke [facility])    a. 11/2020 MRI Brain: patchy acute/early subacute cortical infarcts w/in the R frontal, parietal, and occiptal lobes. Small, chronic L basal ganglia lacunar infarct.   Thrombocytopenia [facility])     Past Surgical History:  Procedure Laterality Date   ANGIOPLASTY     BONE MARROW ASPIRATION  03/2009   CARDIAC CATHETERIZATION  1997   stent x 1   COLONOSCOPY WITH PROPOFOL N/A 08/09/2018   Procedure: COLONOSCOPY WITH PROPOFOL;  Surgeon: James Bellows, MD;  Location: [facility] ENDOSCOPY;  Service: Gastroenterology;  Laterality: N/A;   CORONARY STENT PLACEMENT  08/1996   EXTERIORIZATION OF A CONTINUOUS AMBULATORY PERITONEAL DIALYSIS CATHETER  EYE SURGERY Bilateral    cats removed   REVERSE SHOULDER ARTHROPLASTY Left 11/19/2020   Procedure: Left reverse shoulder arthroplasty;  Surgeon: Leim Fabry, MD;  Location: [facility] ORS;  Service: Orthopedics;  Laterality: Left;    Prior to Admission medications   Medication Sig Start Date End Date Taking? Authorizing Provider  albuterol (VENTOLIN HFA) 108 (90 Base) MCG/ACT inhaler Inhale 2 puffs into the lungs every 6 (six) hours as needed for wheezing or shortness of breath. 02/27/21  Yes Borders, Kirt Boys, NP  aspirin 81 MG chewable tablet Chew 1 tablet (81 mg total) by mouth daily. 01/08/21  Yes Johnson, Megan P, DO  benzonatate (TESSALON) 200 MG capsule Take 1 capsule by mouth in the morning, at noon, and at bedtime. 07/21/21 07/28/21  Yes [provider]  carvedilol (COREG) 25 MG tablet Take 1 tablet (25 mg total) by mouth 2 (two) times daily. 06/13/21 09/11/21 Yes Theora Gianotti, NP  Cholecalciferol (VITAMIN D-1000 MAX ST) 25 MCG (1000 UT) tablet Take 1,000 Units by mouth daily.    Yes [provider]  citalopram (CELEXA) 40 MG tablet Take 1 tablet (40 mg total) by mouth daily. 01/08/21  Yes Johnson, Megan P, DO  ferrous sulfate 325 (65 FE) MG tablet Take 1 tablet (325 mg total) by mouth daily with breakfast. 12/30/17 08/06/23 Yes Pyreddy, Reatha Harps, MD  furosemide (LASIX) 20 MG tablet Take 1 tablet (20 mg total) by mouth daily. 01/08/21  Yes Johnson, Megan P, DO  gentamicin cream (GARAMYCIN) 0.1 % Apply 1 application topically every other day. 03/18/19  Yes [provider]  guaiFENesin (ROBITUSSIN) 100 MG/5ML liquid Take 5 mLs by mouth 3 (three) times daily as needed. 07/21/21  Yes [provider]  levETIRAcetam (KEPPRA) 500 MG tablet Take 1 tablet (500 mg total) by mouth at bedtime. 11/24/20  Yes Wieting, [name], MD  ondansetron (ZOFRAN ODT) 4 MG disintegrating tablet Take 1 tablet (4 mg total) by mouth every 8 (eight) hours as needed for nausea or vomiting. 07/10/21  Yes Johnson, Megan P, DO  pantoprazole (PROTONIX) 40 MG tablet Take 1 tablet (40 mg total) by mouth daily. 07/10/21 07/10/22 Yes Johnson, Megan P, DO  polyethylene glycol (MIRALAX / GLYCOLAX) 17 g packet Take 17 g by mouth daily as needed. 11/24/20  Yes Wieting, [name], MD  predniSONE (DELTASONE) 10 MG tablet Take 10 mg by mouth daily with breakfast.   Yes [provider]    Family History  Problem Relation Age of Onset   Stroke Mother        Low BP stroke   Depression Father    Depression Sister        Breast   Heart disease Other    Breast cancer Other    Lung cancer Other    Ovarian cancer Other    Stomach cancer Other      Social History   Tobacco Use   Smoking status: Every Day    Packs/day: 1.00     Years: 47.00    Pack years: 47.00    Types: Cigarettes   Smokeless tobacco: Never   Tobacco comments:    trying to cut back-smokes 6 to 7 cigs per day  Vaping Use   Vaping Use: Never used  Substance Use Topics   Alcohol use: Yes    Comment: "6 beers weekly on Saturdays"   Drug use: No    Allergies as of 07/23/2021   (No Known Allergies)    Review of Systems:  All systems reviewed and negative except where noted in HPI.   Physical Exam:  BP 117/64   Pulse 77   Temp 97.7 F (36.5 C) (Oral)   Ht 5\' 7"  (1.702 m)   Wt 146 lb 6.4 oz (66.4 kg)   BMI 22.93 kg/m  No LMP for male patient. Psych:  Alert and cooperative. Normal mood and affect. General:   Alert,  Well-developed, well-nourished, pleasant and cooperative in NAD Head:  Normocephalic and atraumatic. Eyes:  Sclera clear, no icterus.   Conjunctiva pink. Ears:  Normal auditory acuity. Lungs:  Respirations even and unlabored.  Clear throughout to auscultation.   No wheezes, crackles, or rhonchi. No acute distress. Heart:  Regular rate and rhythm; no murmurs, clicks, rubs, or gallops. Abdomen:  Normal bowel sounds.  No bruits.  Soft, non-tender and non-distended without masses, hepatosplenomegaly or hernias noted.  No guarding or rebound tenderness.    Neurologic:  Alert and oriented x3;  grossly normal neurologically. Psych:  Alert and cooperative. Normal mood and affect.  Imaging Studies: CT HEAD WO CONTRAST  Result Date: 07/05/2021 CLINICAL DATA:  Numbness and tingling, paresthesia. Left facial numbness. EXAM: CT HEAD WITHOUT CONTRAST TECHNIQUE: Contiguous axial images were obtained from the base of the skull through the vertex without intravenous contrast. COMPARISON:  CT head 11/20/2020 FINDINGS: Brain: No evidence of acute infarction, hemorrhage, hydrocephalus, extra-axial collection or mass lesion/mass effect. Small area encephalomalacia in the right frontal vertex unchanged in compatible with small chronic  cortical infarct. Vascular: Negative for hyperdense vessel Skull: Negative Sinuses/Orbits: Negative Other: None IMPRESSION: No acute abnormality and no change from the recent study. Small chronic infarct right posterior frontal convexity Electronically Signed   By: Franchot Gallo M.D.   On: 07/05/2021 13:46   MR ANGIO HEAD WO CONTRAST  Result Date: 07/05/2021 CLINICAL DATA:  Neuro deficit, acute, stroke suspected; left facial numbness EXAM: MRI HEAD WITHOUT CONTRAST MRA HEAD WITHOUT CONTRAST MRA NECK WITHOUT CONTRAST TECHNIQUE: Multiplanar, multiecho pulse sequences of the brain and surrounding structures were obtained without intravenous contrast. Angiographic images of the Circle of Willis were obtained using MRA technique without intravenous contrast. Angiographic images of the neck were obtained using MRA technique without intravenous contrast. Carotid stenosis measurements (when applicable) are obtained utilizing NASCET criteria, using the distal internal carotid diameter as the denominator. COMPARISON:  March 2022 FINDINGS: MRI HEAD Brain: There is no acute infarction or intracranial hemorrhage. There is no intracranial mass, mass effect, or edema. There is no hydrocephalus or extra-axial fluid collection. Ventricles and sulci are within normal limits in size and configuration. Few scattered foci of susceptibility are again identified particularly in the high right cerebral hemisphere most consistent with chronic blood products. There are new chronic blood products along the right precentral gyrus in the area of infarction on prior study. Additional now chronic right parietal and occipital cortical infarcts are noted. Minimal additional patchy T2 hyperintensity in the supratentorial white matter is nonspecific but may reflect chronic microvascular ischemic changes. Vascular: Major vessel flow voids at the skull base are preserved. Skull and upper cervical spine: Mildly decreased T1 marrow signal could  reflect hematopoietic marrow. Sinuses/Orbits: Paranasal sinuses are aerated. Bilateral lens replacements. Other: Sella is unremarkable.  Mastoid air cells are clear. MRA HEAD Intracranial internal carotid arteries are patent with atherosclerotic irregularity. Possible superimposed 2 mm laterally directed outpouching from the proximal right cavernous segment reflecting infundibulum or aneurysm. Middle and anterior cerebral arteries are patent. Intracranial vertebral arteries, basilar artery, posterior cerebral arteries  are patent. Bilateral posterior communicating arteries are present. There is no significant stenosis or aneurysm. MRA NECK Included portions of the common carotid arteries are patent. Internal and external carotid arteries are patent. There is plaque at the right greater than left ICA origins without hemodynamically significant stenosis. The included extracranial vertebral arteries are patent. IMPRESSION: No acute infarction, hemorrhage, or mass. Additional chronic findings detailed above. No large vessel occlusion. Plaque at the right greater than left ICA origins without hemodynamically significant stenosis. Atherosclerotic irregularity of the intracranial internal carotids with possible superimposed 2 mm infundibulum or aneurysm of the proximal right cavernous ICA. Electronically Signed   By: Macy Mis M.D.   On: 07/05/2021 18:28   MR ANGIO NECK WO CONTRAST  Result Date: 07/05/2021 CLINICAL DATA:  Neuro deficit, acute, stroke suspected; left facial numbness EXAM: MRI HEAD WITHOUT CONTRAST MRA HEAD WITHOUT CONTRAST MRA NECK WITHOUT CONTRAST TECHNIQUE: Multiplanar, multiecho pulse sequences of the brain and surrounding structures were obtained without intravenous contrast. Angiographic images of the Circle of Willis were obtained using MRA technique without intravenous contrast. Angiographic images of the neck were obtained using MRA technique without intravenous contrast. Carotid stenosis  measurements (when applicable) are obtained utilizing NASCET criteria, using the distal internal carotid diameter as the denominator. COMPARISON:  March 2022 FINDINGS: MRI HEAD Brain: There is no acute infarction or intracranial hemorrhage. There is no intracranial mass, mass effect, or edema. There is no hydrocephalus or extra-axial fluid collection. Ventricles and sulci are within normal limits in size and configuration. Few scattered foci of susceptibility are again identified particularly in the high right cerebral hemisphere most consistent with chronic blood products. There are new chronic blood products along the right precentral gyrus in the area of infarction on prior study. Additional now chronic right parietal and occipital cortical infarcts are noted. Minimal additional patchy T2 hyperintensity in the supratentorial white matter is nonspecific but may reflect chronic microvascular ischemic changes. Vascular: Major vessel flow voids at the skull base are preserved. Skull and upper cervical spine: Mildly decreased T1 marrow signal could reflect hematopoietic marrow. Sinuses/Orbits: Paranasal sinuses are aerated. Bilateral lens replacements. Other: Sella is unremarkable.  Mastoid air cells are clear. MRA HEAD Intracranial internal carotid arteries are patent with atherosclerotic irregularity. Possible superimposed 2 mm laterally directed outpouching from the proximal right cavernous segment reflecting infundibulum or aneurysm. Middle and anterior cerebral arteries are patent. Intracranial vertebral arteries, basilar artery, posterior cerebral arteries are patent. Bilateral posterior communicating arteries are present. There is no significant stenosis or aneurysm. MRA NECK Included portions of the common carotid arteries are patent. Internal and external carotid arteries are patent. There is plaque at the right greater than left ICA origins without hemodynamically significant stenosis. The included  extracranial vertebral arteries are patent. IMPRESSION: No acute infarction, hemorrhage, or mass. Additional chronic findings detailed above. No large vessel occlusion. Plaque at the right greater than left ICA origins without hemodynamically significant stenosis. Atherosclerotic irregularity of the intracranial internal carotids with possible superimposed 2 mm infundibulum or aneurysm of the proximal right cavernous ICA. Electronically Signed   By: Macy Mis M.D.   On: 07/05/2021 18:28   MR BRAIN WO CONTRAST  Result Date: 07/05/2021 CLINICAL DATA:  Neuro deficit, acute, stroke suspected; left facial numbness EXAM: MRI HEAD WITHOUT CONTRAST MRA HEAD WITHOUT CONTRAST MRA NECK WITHOUT CONTRAST TECHNIQUE: Multiplanar, multiecho pulse sequences of the brain and surrounding structures were obtained without intravenous contrast. Angiographic images of the Circle of Willis were obtained using MRA technique  without intravenous contrast. Angiographic images of the neck were obtained using MRA technique without intravenous contrast. Carotid stenosis measurements (when applicable) are obtained utilizing NASCET criteria, using the distal internal carotid diameter as the denominator. COMPARISON:  March 2022 FINDINGS: MRI HEAD Brain: There is no acute infarction or intracranial hemorrhage. There is no intracranial mass, mass effect, or edema. There is no hydrocephalus or extra-axial fluid collection. Ventricles and sulci are within normal limits in size and configuration. Few scattered foci of susceptibility are again identified particularly in the high right cerebral hemisphere most consistent with chronic blood products. There are new chronic blood products along the right precentral gyrus in the area of infarction on prior study. Additional now chronic right parietal and occipital cortical infarcts are noted. Minimal additional patchy T2 hyperintensity in the supratentorial white matter is nonspecific but may reflect  chronic microvascular ischemic changes. Vascular: Major vessel flow voids at the skull base are preserved. Skull and upper cervical spine: Mildly decreased T1 marrow signal could reflect hematopoietic marrow. Sinuses/Orbits: Paranasal sinuses are aerated. Bilateral lens replacements. Other: Sella is unremarkable.  Mastoid air cells are clear. MRA HEAD Intracranial internal carotid arteries are patent with atherosclerotic irregularity. Possible superimposed 2 mm laterally directed outpouching from the proximal right cavernous segment reflecting infundibulum or aneurysm. Middle and anterior cerebral arteries are patent. Intracranial vertebral arteries, basilar artery, posterior cerebral arteries are patent. Bilateral posterior communicating arteries are present. There is no significant stenosis or aneurysm. MRA NECK Included portions of the common carotid arteries are patent. Internal and external carotid arteries are patent. There is plaque at the right greater than left ICA origins without hemodynamically significant stenosis. The included extracranial vertebral arteries are patent. IMPRESSION: No acute infarction, hemorrhage, or mass. Additional chronic findings detailed above. No large vessel occlusion. Plaque at the right greater than left ICA origins without hemodynamically significant stenosis. Atherosclerotic irregularity of the intracranial internal carotids with possible superimposed 2 mm infundibulum or aneurysm of the proximal right cavernous ICA. Electronically Signed   By: Macy Mis M.D.   On: 07/05/2021 18:28    Assessment and Plan:   TRYSTAN AKHTAR is a 65 y.o. y/o male has been referred for coffee-ground emesis ongoing for a month.  Usually occurs in the morning.  Has been commenced on a PPI which she has been taking first thing in the morning on an empty stomach 40 mg a day for the past few days and since has not had any further coffee-ground emesis.  He also has some history suggestive of  melena.  Hemoglobin is stable at 11.4 g.  No significant elevation in BUN/creatinine ratio.  He has primary myelofibrosis.  With splenomegaly.  Barium swallow in March 2022 showed thickening at the GE junction impairing the passage of a barium tablet which definitely would warrant further evaluation.  With his history of smoking and coffee-ground emesis the differential diagnosis could range from acid reflux with esophagitis versus neoplasm.   Plan 1.  Continue Protonix 40 mg once a day 2.  EGD on Thursday i.e. day after tomorrow  I have discussed alternative options, risks & benefits,  which include, but are not limited to, bleeding, infection, perforation,respiratory complication & drug reaction.  The patient agrees with this plan & written consent will be obtained.     Follow up in 6 weeks  Dr James Bellows MD,MRCP(U.K)

## 2021-07-24 ENCOUNTER — Inpatient Hospital Stay: Payer: Medicare HMO

## 2021-07-24 ENCOUNTER — Inpatient Hospital Stay (HOSPITAL_BASED_OUTPATIENT_CLINIC_OR_DEPARTMENT_OTHER): Payer: Medicare HMO | Admitting: Internal Medicine

## 2021-07-24 ENCOUNTER — Inpatient Hospital Stay: Payer: Medicare HMO | Attending: Internal Medicine

## 2021-07-24 ENCOUNTER — Encounter: Payer: Self-pay | Admitting: Gastroenterology

## 2021-07-24 DIAGNOSIS — Z800 Family history of malignant neoplasm of digestive organs: Secondary | ICD-10-CM | POA: Insufficient documentation

## 2021-07-24 DIAGNOSIS — D471 Chronic myeloproliferative disease: Secondary | ICD-10-CM | POA: Insufficient documentation

## 2021-07-24 DIAGNOSIS — F17210 Nicotine dependence, cigarettes, uncomplicated: Secondary | ICD-10-CM | POA: Insufficient documentation

## 2021-07-24 DIAGNOSIS — N186 End stage renal disease: Secondary | ICD-10-CM | POA: Diagnosis not present

## 2021-07-24 DIAGNOSIS — Z801 Family history of malignant neoplasm of trachea, bronchus and lung: Secondary | ICD-10-CM | POA: Diagnosis not present

## 2021-07-24 DIAGNOSIS — Z992 Dependence on renal dialysis: Secondary | ICD-10-CM | POA: Insufficient documentation

## 2021-07-24 DIAGNOSIS — Z803 Family history of malignant neoplasm of breast: Secondary | ICD-10-CM | POA: Diagnosis not present

## 2021-07-24 DIAGNOSIS — Z8673 Personal history of transient ischemic attack (TIA), and cerebral infarction without residual deficits: Secondary | ICD-10-CM | POA: Diagnosis not present

## 2021-07-24 DIAGNOSIS — Z8041 Family history of malignant neoplasm of ovary: Secondary | ICD-10-CM | POA: Insufficient documentation

## 2021-07-24 DIAGNOSIS — R161 Splenomegaly, not elsewhere classified: Secondary | ICD-10-CM | POA: Diagnosis not present

## 2021-07-24 DIAGNOSIS — R109 Unspecified abdominal pain: Secondary | ICD-10-CM | POA: Diagnosis not present

## 2021-07-24 DIAGNOSIS — I120 Hypertensive chronic kidney disease with stage 5 chronic kidney disease or end stage renal disease: Secondary | ICD-10-CM | POA: Insufficient documentation

## 2021-07-24 LAB — COMPREHENSIVE METABOLIC PANEL
ALT: 10 U/L (ref 0–44)
AST: 11 U/L — ABNORMAL LOW (ref 15–41)
Albumin: 3.1 g/dL — ABNORMAL LOW (ref 3.5–5.0)
Alkaline Phosphatase: 63 U/L (ref 38–126)
Anion gap: 12 (ref 5–15)
BUN: 41 mg/dL — ABNORMAL HIGH (ref 8–23)
CO2: 26 mmol/L (ref 22–32)
Calcium: 8.3 mg/dL — ABNORMAL LOW (ref 8.9–10.3)
Chloride: 98 mmol/L (ref 98–111)
Creatinine, Ser: 8.51 mg/dL — ABNORMAL HIGH (ref 0.61–1.24)
GFR, Estimated: 6 mL/min — ABNORMAL LOW (ref >60)
Glucose, Bld: 90 mg/dL (ref 70–99)
Potassium: 4 mmol/L (ref 3.5–5.1)
Sodium: 136 mmol/L (ref 135–145)
Total Bilirubin: 0.5 mg/dL (ref 0.3–1.2)
Total Protein: 6.1 g/dL — ABNORMAL LOW (ref 6.5–8.1)

## 2021-07-24 LAB — CBC WITH DIFFERENTIAL/PLATELET
Abs Immature Granulocytes: 0.02 10*3/uL (ref 0.00–0.07)
Basophils Absolute: 0 10*3/uL (ref 0.0–0.1)
Basophils Relative: 0 %
Eosinophils Absolute: 0 10*3/uL (ref 0.0–0.5)
Eosinophils Relative: 0 %
HCT: 34.1 % — ABNORMAL LOW (ref 39.0–52.0)
Hemoglobin: 10.9 g/dL — ABNORMAL LOW (ref 13.0–17.0)
Immature Granulocytes: 1 %
Lymphocytes Relative: 9 %
Lymphs Abs: 0.4 10*3/uL — ABNORMAL LOW (ref 0.7–4.0)
MCH: 29.1 pg (ref 26.0–34.0)
MCHC: 32 g/dL (ref 30.0–36.0)
MCV: 91.2 fL (ref 80.0–100.0)
Monocytes Absolute: 0.2 10*3/uL (ref 0.1–1.0)
Monocytes Relative: 5 %
Neutro Abs: 3.4 10*3/uL (ref 1.7–7.7)
Neutrophils Relative %: 85 %
Platelets: 133 10*3/uL — ABNORMAL LOW (ref 150–400)
RBC: 3.74 MIL/uL — ABNORMAL LOW (ref 4.22–5.81)
RDW: 15 % (ref 11.5–15.5)
WBC: 4 10*3/uL (ref 4.0–10.5)
nRBC: 0 % (ref 0.0–0.2)

## 2021-07-24 LAB — SAMPLE TO BLOOD BANK

## 2021-07-24 LAB — LACTATE DEHYDROGENASE: LDH: 624 U/L — ABNORMAL HIGH (ref 98–192)

## 2021-07-24 NOTE — Assessment & Plan Note (Addendum)
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     # OCT 1st, 2022-US Splenomegaly with increase in splenic size from prior imaging, current splenic volume 1024cc; patient symptomatic from fatigue/question abdominal discomfort from splenomegaly versus other [see below]. Discussed re: using hydrea; will consider at next visit.   # Abdominal pain/egigastric pain/hematemesis- cut prendisone 5 mg/day; and Take PPI Twice a day; before meals; awaiting EGD tomorrow.   #Anemia hemoglobin is 8-9. Hb today- 11.1- STABLE.  Continue EPO stimulating agent /IV iron as per nephrology; awaiting above EGD/    #Stroke/left upper extremity weakness/seizures- [s/p shoulder surgery]  on antiplatelet therapy.STABLE;   #COPD-poorly controlled; declines PFTs;on prednisone 10 mg a day- see below- STABLE;   #End-stage renal disease [Dr. Latif]  on PD- STABLE;    #Left nasal fold lesion-question BCC;s/p [facility];awaiing Mohs surgeon- [facility].   # COVID vaccine: DECLINED Covid-19 vaccine.   I spoke at length with the patient's wife- regarding the patient's clinical status/plan of care.  Family agreement.   # DISPOSITION: mychart apt. # HOLD PRBC transfusion  # follow up in 1 months-labs- MD-CBC/CMP;LDH;HOLD tube; posssible1 unit PRBC tranfusion- Dr.B

## 2021-07-24 NOTE — Progress Notes (Signed)
[place] OFFICE PROGRESS NOTE  Patient Care Team: Valerie Roys, DO as PCP - General (Family Medicine) End, Harrell Gave, MD as PCP - Cardiology (Cardiology) Cammie Sickle, MD as Consulting Physician (Internal Medicine) Dasher, Rayvon Char, MD as Consulting Physician (Dermatology)  Cancer Staging No matching staging information was found for the patient.   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   # AUGUST 1st week 2019- Retacrit   # October 2nd 2019- jakafi 10 BID [? Renal involvement]; September 2020-peritoneal dialysis; Jakafi 10 mg in the morning; STOPPED/tapered [id number], 2021 [sec intolerance fatigue/myalgias]  # May 4th 2022- START [name] dosing/PD]; STOPPED after2-3 weeks-because of poor tolerance [sever fatigue/? 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 [name]- kidney Bx- FSG [ on Prednisone Dr.[name]]; July 2020-peritoneal dialysis;   # hx of Lung nodules- resolved [[name]]/quit smoking.  DIAGNOSIS: PRIMARY MYELOFIBROSIS  RISK:LOW     ;GOALS: Control  CURRENT/MOST RECENT THERAPY : Surveillance    Primary myelofibrosis ([place])      INTERVAL HISTORY:Ambulating independently.  He is accompanied by his wife.  [name] 65 y.o.  male pleasant patient above history of primary myelofibrosis Jak 2+ currently off [place] surveillance chronic kidney disease on peritoneal dialysis is here for follow-up.  In the interim patient was evaluated at [facility] ER.-For hematemesis.  Also evaluated at [facility] ER again for hematemesis.  Patient has been evaluated by GI.  Awaiting EGD tomorrow- [Dr.Anna]  Patient complains of intermittent abdominal  discomfort.  And epigastric region.  He is currently on Prilosec once a day.  Has chronic fatigue.    Appetite is good.  Denies any early satiety.  No chills.  Review of Systems  Constitutional:  Positive for malaise/fatigue. Negative for chills, diaphoresis and fever.  HENT:  Negative for nosebleeds and sore throat.   Eyes:  Negative for double vision.  Respiratory:  Positive for cough and shortness of breath. Negative for hemoptysis, sputum production and wheezing.   Cardiovascular:  Negative for chest pain, palpitations and orthopnea.  Gastrointestinal:  Negative for abdominal pain, blood in stool, constipation, diarrhea, heartburn, melena, nausea and vomiting.  Genitourinary:  Negative for dysuria, frequency and urgency.  Musculoskeletal:  Positive for back pain and joint pain.  Skin: Negative.  Negative for itching and rash.  Neurological:  Positive for dizziness and tingling. Negative for focal weakness, weakness and headaches.  Endo/Heme/Allergies:  Does not bruise/bleed easily.  Psychiatric/Behavioral:  Negative for depression. The patient is not nervous/anxious and does not have insomnia.      PAST MEDICAL HISTORY :  Past Medical History:  Diagnosis Date   Anemia    Benign hypertensive renal disease    CAD (coronary artery disease)    a. 08/1996 s/p BMS to the mLAD ([facility]); b. 12/2017 MV: EF 46%, fixed apical defect w/ significant GI uptake artifact. No ischemia-> low risk; c. 07/2020 MV: EF 51%, no ischemia/infarct.   Carotid arterial disease ([place])    a. 11/2020 Carotid U/S: <50% bilat ICA stenoses.   CKD (chronic kidney disease), stage V ([place])    on peritoneal dialysis   Complication of anesthesia    please call by "[name]" when waking up!!   COPD (chronic obstructive pulmonary disease) ([place])  centrilobular emphysema. pt is poorly controlled with his copd/smoking.   Depression    Diastolic dysfunction    a. 12/2017 Echo: EF 60-65%, no rwma, Gr1 DD, mild AI/MR. Nl RVSP;  b. 08/2020 Echo: EF 60-65%, no rwma, GrI DD, nl RV fxn. RVSP 32.44mHg.   Dyspnea on exertion    with exertion   Fatigue    FSGS (focal segmental glomerulosclerosis)    Hyperlipidemia 10/2002   Hypertension    myelofibrosis    bone marrow failure   [place]el[place]   JAK2 (+) primary   [place]   stent x 1   Pneumoperitoneum 02/2021   PSVT (paroxysmal supraventricular tachycardia) ([place]    a. 12/2020 Zio: Sinus rhythm 80 (55-117). Rare PACs/PVCs. 17 episodes of SVT (longest 20.3 secs, fastest 184). No triggered events. No afib.   Smoker    Stroke ([facility]    a. 11/2020 MRI Brain: patchy acute/early subacute cortical infarcts w/in the R frontal, parietal, and occiptal lobes. Small, chronic L basal ganglia lacunar infarct.   Thrombocytopenia ([place]     PAST SURGICAL HISTORY :   Past Surgical History:  Procedure Laterality Date   ANGIOPLASTY     BONE MARROW ASPIRATION  03/2009   CARDIAC CATHETERIZATION  1997   stent x 1   COLONOSCOPY WITH PROPOFOL N/A 08/09/2018   Procedure: COLONOSCOPY WITH PROPOFOL;  Surgeon: AJonathon Bellows MD;  [facility];  Service: Gastroenterology;  Laterality: N/A;   CORONARY STENT PLACEMENT  08/1996   EXTERIORIZATION OF A CONTINUOUS AMBULATORY PERITONEAL DIALYSIS CATHETER     EYE SURGERY Bilateral    cats removed   REVERSE SHOULDER ARTHROPLASTY Left 11/19/2020   Procedure: Left reverse shoulder arthroplasty;  Surgeon: PLeim Fabry MD;  Location: [facility] ORS;  Service: Orthopedics;  Laterality: Left;    FAMILY HISTORY :   Family History  Problem Relation Age of Onset   Stroke Mother        Low BP stroke   Depression Father    Depression Sister        Breast   Heart disease Other    Breast cancer Other    Lung cancer Other    Ovarian cancer Other    Stomach cancer Other     SOCIAL HISTORY:   Social History   Tobacco Use   Smoking status: Every Day    Packs/day: 1.00    Years: 47.00    Pack years: 47.00     Types: Cigarettes   Smokeless tobacco: Never   Tobacco comments:    trying to cut back-smokes 6 to 7 cigs per day  Vaping Use   Vaping Use: Never used  Substance Use Topics   Alcohol use: Yes    Comment: "6 beers weekly on Saturdays"   Drug use: No    ALLERGIES:  has No Known Allergies.  MEDICATIONS:  Current Outpatient Medications  Medication Sig Dispense Refill   albuterol (VENTOLIN HFA) 108 (90 Base) MCG/ACT inhaler Inhale 2 puffs into the lungs every 6 (six) hours as needed for wheezing or shortness of breath. 8 g 2   aspirin 81 MG chewable tablet Chew 1 tablet (81 mg total) by mouth daily. 90 tablet 1   benzonatate (TESSALON) 200 MG capsule Take 1 capsule by mouth in the morning, at noon, and at bedtime.     carvedilol (COREG) 25 MG tablet Take 1 tablet (25 mg total) by mouth 2 (two) times daily. 180 tablet 3   Cholecalciferol (VITAMIN D-1000  MAX ST) 25 MCG (1000 UT) tablet Take 1,000 Units by mouth daily.      citalopram (CELEXA) 40 MG tablet Take 1 tablet (40 mg total) by mouth daily. 90 tablet 1   ferrous sulfate 325 (65 FE) MG tablet Take 1 tablet (325 mg total) by mouth daily with breakfast. 30 tablet 1   furosemide (LASIX) 20 MG tablet Take 1 tablet (20 mg total) by mouth daily. 90 tablet 1   gentamicin cream (GARAMYCIN) 0.1 % Apply 1 application topically every other day.     guaiFENesin (ROBITUSSIN) 100 MG/5ML liquid Take 5 mLs by mouth 3 (three) times daily as needed.     levETIRAcetam (KEPPRA) 500 MG tablet Take 1 tablet (500 mg total) by mouth at bedtime. 30 tablet 0   pantoprazole (PROTONIX) 40 MG tablet Take 1 tablet (40 mg total) by mouth daily. 90 tablet 1   polyethylene glycol (MIRALAX / GLYCOLAX) 17 g packet Take 17 g by mouth daily as needed. 14 each 0   predniSONE (DELTASONE) 10 MG tablet Take 10 mg by mouth daily with breakfast.     ondansetron (ZOFRAN ODT) 4 MG disintegrating tablet Take 1 tablet (4 mg total) by mouth every 8 (eight) hours as needed for  nausea or vomiting. (Patient not taking: Reported on 07/24/2021) 12 tablet 0   No current facility-administered medications for this visit.    PHYSICAL EXAMINATION: ECOG PERFORMANCE STATUS: 1 - Symptomatic but completely ambulatory  BP 129/64 (BP Location: Left Arm, Patient Position: Sitting)   Pulse 66   Temp 98 F (36.7 C) (Tympanic)   Resp 18   Wt 149 lb (67.6 kg)   SpO2 99%   BMI 23.34 kg/m   Filed Weights   07/24/21 0915  Weight: 149 lb (67.6 kg)    Physical Exam HENT:     Head: Normocephalic and atraumatic.     Mouth/Throat:     Pharynx: No oropharyngeal exudate.  Eyes:     Pupils: Pupils are equal, round, and reactive to light.  Cardiovascular:     Rate and Rhythm: Normal rate and regular rhythm.  Pulmonary:     Effort: No respiratory distress.     Breath sounds: No wheezing.     Comments: Decreased air entry bilaterally. Abdominal:     General: Bowel sounds are normal. There is no distension.     Palpations: Abdomen is soft. There is no mass.     Tenderness: There is no abdominal tenderness. There is no guarding or rebound.     Comments: Positive for splenomegaly.  Musculoskeletal:        General: No tenderness. Normal range of motion.     Cervical back: Normal range of motion and neck supple.  Skin:    General: Skin is warm.     Comments: Left nasal fold-half a centimeter skin lesion noted concerning for BCC.   Neurological:     Mental Status: He is alert and oriented to person, place, and time.     Comments: Left upper extremity weakness noted.    Psychiatric:        Mood and Affect: Affect normal.    LABORATORY DATA:  I have reviewed the data as listed    Component Value Date/Time   NA 136 07/24/2021 0857   NA 140 07/10/2021 0828   K 4.0 07/24/2021 0857   CL 98 07/24/2021 0857   CO2 26 07/24/2021 0857   GLUCOSE 90 07/24/2021 0857   BUN 41 (H) 07/24/2021 0857  BUN 41 (H) 07/10/2021 0828   CREATININE 8.51 (H) 07/24/2021 0857   CREATININE  1.12 09/30/2013 0953   CALCIUM 8.3 (L) 07/24/2021 0857   PROT 6.1 (L) 07/24/2021 0857   PROT 5.5 (L) 07/10/2021 0828   ALBUMIN 3.1 (L) 07/24/2021 0857   ALBUMIN 3.6 (L) 07/10/2021 0828   AST 11 (L) 07/24/2021 0857   ALT 10 07/24/2021 0857   ALKPHOS 63 07/24/2021 0857   BILITOT 0.5 07/24/2021 0857   BILITOT 0.3 07/10/2021 0828   GFRNONAA 6 (L) 07/24/2021 0857   GFRNONAA >60 09/30/2013 0953   GFRAA 14 (L) 07/10/2020 0909   GFRAA >60 09/30/2013 0953    No results found for: SPEP, UPEP  Lab Results  Component Value Date   WBC 4.0 07/24/2021   NEUTROABS 3.4 07/24/2021   HGB 10.9 (L) 07/24/2021   HCT 34.1 (L) 07/24/2021   MCV 91.2 07/24/2021   PLT 133 (L) 07/24/2021      Chemistry      Component Value Date/Time   NA 136 07/24/2021 0857   NA 140 07/10/2021 0828   K 4.0 07/24/2021 0857   CL 98 07/24/2021 0857   CO2 26 07/24/2021 0857   BUN 41 (H) 07/24/2021 0857   BUN 41 (H) 07/10/2021 0828   CREATININE 8.51 (H) 07/24/2021 0857   CREATININE 1.12 09/30/2013 0953      Component Value Date/Time   CALCIUM 8.3 (L) 07/24/2021 0857   ALKPHOS 63 07/24/2021 0857   AST 11 (L) 07/24/2021 0857   ALT 10 07/24/2021 0857   BILITOT 0.5 07/24/2021 0857   BILITOT 0.3 07/10/2021 0828       RADIOGRAPHIC STUDIES: I have personally reviewed the radiological images as listed and agreed with the findings in the report. No results found.   ASSESSMENT & PLAN:  Primary myelofibrosis ([place]) # Primary Myelofiborisis [Bone marrow 2010] jak-2 POSITIVE;currently on surveillance; Patient discontinued Jakafi-because of poor tolerance.     # OCT 1st, 2022-US Splenomegaly with increase in splenic size from prior imaging, current splenic volume 1024cc; patient symptomatic from fatigue/question abdominal discomfort from splenomegaly versus other [see below]. Discussed re: using hydrea; will consider at next visit.   # Abdominal pain/egigastric pain/hematemesis- cut prendisone 5 mg/day; and Take PPI  Twice a day; before meals; awaiting EGD tomorrow.   #Anemia hemoglobin is 8-9. Hb today- 11.1- STABLE.  Continue EPO stimulating agent /IV iron as per nephrology; awaiting above EGD/    #Stroke/left upper extremity weakness/seizures- [s/p shoulder surgery]  on antiplatelet therapy. STABLE;   #COPD-poorly controlled; declines PFTs;on prednisone 10 mg a day- see below- STABLE;   #End-stage renal disease [Dr. Latif]  on PD- STABLE;    #Left nasal fold lesion-question BCC;s/p [facility];awaiing Mohs surgeon- [facility].   # COVID vaccine: DECLINED Covid-19 vaccine.   I spoke at length with the patient's wife- regarding the patient's clinical status/plan of care.  Family agreement.   # DISPOSITION: mychart apt. # HOLD PRBC transfusion  # follow up in 1 months-labs- MD-CBC/CMP;LDH;HOLD tube; posssible1 unit PRBC tranfusion- Dr.B  Orders Placed This Encounter  Procedures   CBC with Differential    Standing Status:   Future    Standing Expiration Date:   07/24/2022   Comprehensive metabolic panel    Standing Status:   Future    Standing Expiration Date:   07/24/2022   Lactate dehydrogenase    Standing Status:   Future    Standing Expiration Date:   07/24/2022   Hold Tube- Blood Bank  Standing Status:   Future    Standing Expiration Date:   07/24/2022   All questions were answered. The patient knows to call the clinic with any problems, questions or concerns.     Cammie Sickle, MD 07/24/2021 1:03 PM

## 2021-07-24 NOTE — Progress Notes (Signed)
Tingling on left side since shoulder surgery. Stomach issues - he is having endoscopy tomorrow.

## 2021-07-25 ENCOUNTER — Encounter
Admission: RE | Disposition: A | Discharge status: Home / Self Care | Payer: Self-pay | Source: Home / Self Care | Attending: Gastroenterology

## 2021-07-25 ENCOUNTER — Other Ambulatory Visit: Payer: Self-pay

## 2021-07-25 ENCOUNTER — Encounter: Payer: Self-pay | Admitting: Gastroenterology

## 2021-07-25 ENCOUNTER — Ambulatory Visit: Payer: Medicare HMO | Admitting: Anesthesiology

## 2021-07-25 ENCOUNTER — Ambulatory Visit
Admission: RE | Admit: 2021-07-25 | Discharge: 2021-07-25 | Disposition: A | Discharge status: Home / Self Care | Payer: Medicare HMO | Attending: Gastroenterology | Admitting: Gastroenterology

## 2021-07-25 DIAGNOSIS — I120 Hypertensive chronic kidney disease with stage 5 chronic kidney disease or end stage renal disease: Secondary | ICD-10-CM | POA: Diagnosis not present

## 2021-07-25 DIAGNOSIS — Z955 Presence of coronary angioplasty implant and graft: Secondary | ICD-10-CM | POA: Diagnosis not present

## 2021-07-25 DIAGNOSIS — I252 Old myocardial infarction: Secondary | ICD-10-CM | POA: Insufficient documentation

## 2021-07-25 DIAGNOSIS — Z992 Dependence on renal dialysis: Secondary | ICD-10-CM | POA: Diagnosis not present

## 2021-07-25 DIAGNOSIS — I2510 Atherosclerotic heart disease of native coronary artery without angina pectoris: Secondary | ICD-10-CM | POA: Insufficient documentation

## 2021-07-25 DIAGNOSIS — F17210 Nicotine dependence, cigarettes, uncomplicated: Secondary | ICD-10-CM | POA: Diagnosis not present

## 2021-07-25 DIAGNOSIS — K209 Esophagitis, unspecified without bleeding: Secondary | ICD-10-CM | POA: Diagnosis not present

## 2021-07-25 DIAGNOSIS — Z7952 Long term (current) use of systemic steroids: Secondary | ICD-10-CM | POA: Insufficient documentation

## 2021-07-25 DIAGNOSIS — N186 End stage renal disease: Secondary | ICD-10-CM | POA: Diagnosis not present

## 2021-07-25 DIAGNOSIS — K2970 Gastritis, unspecified, without bleeding: Secondary | ICD-10-CM | POA: Insufficient documentation

## 2021-07-25 DIAGNOSIS — Z7982 Long term (current) use of aspirin: Secondary | ICD-10-CM | POA: Insufficient documentation

## 2021-07-25 DIAGNOSIS — E785 Hyperlipidemia, unspecified: Secondary | ICD-10-CM | POA: Diagnosis not present

## 2021-07-25 DIAGNOSIS — Z79899 Other long term (current) drug therapy: Secondary | ICD-10-CM | POA: Diagnosis not present

## 2021-07-25 DIAGNOSIS — N184 Chronic kidney disease, stage 4 (severe): Secondary | ICD-10-CM | POA: Insufficient documentation

## 2021-07-25 DIAGNOSIS — K2100 Gastro-esophageal reflux disease with esophagitis, without bleeding: Secondary | ICD-10-CM | POA: Diagnosis not present

## 2021-07-25 DIAGNOSIS — J432 Centrilobular emphysema: Secondary | ICD-10-CM | POA: Insufficient documentation

## 2021-07-25 DIAGNOSIS — K3189 Other diseases of stomach and duodenum: Secondary | ICD-10-CM | POA: Diagnosis not present

## 2021-07-25 DIAGNOSIS — K920 Hematemesis: Secondary | ICD-10-CM | POA: Diagnosis not present

## 2021-07-25 DIAGNOSIS — K2990 Gastroduodenitis, unspecified, without bleeding: Secondary | ICD-10-CM | POA: Diagnosis not present

## 2021-07-25 DIAGNOSIS — K2980 Duodenitis without bleeding: Secondary | ICD-10-CM | POA: Diagnosis not present

## 2021-07-25 HISTORY — PX: ESOPHAGOGASTRODUODENOSCOPY (EGD) WITH PROPOFOL: SHX5813

## 2021-07-25 SURGERY — ESOPHAGOGASTRODUODENOSCOPY (EGD) WITH PROPOFOL
Anesthesia: General

## 2021-07-25 MED ORDER — PHENYLEPHRINE HCL (PRESSORS) 10 MG/ML IV SOLN
INTRAVENOUS | Status: AC
  Filled 2021-07-25: qty 2

## 2021-07-25 MED ORDER — PROPOFOL 500 MG/50ML IV EMUL
INTRAVENOUS | Status: AC
  Filled 2021-07-25: qty 50

## 2021-07-25 MED ORDER — LIDOCAINE HCL (CARDIAC) PF 100 MG/5ML IV SOSY
PREFILLED_SYRINGE | INTRAVENOUS | Status: DC | PRN
  Administered 2021-07-25: 40 mg via INTRAVENOUS

## 2021-07-25 MED ORDER — PROPOFOL 10 MG/ML IV BOLUS
INTRAVENOUS | Status: DC | PRN
  Administered 2021-07-25: 80 mg via INTRAVENOUS

## 2021-07-25 MED ORDER — PROPOFOL 500 MG/50ML IV EMUL
INTRAVENOUS | Status: DC | PRN
  Administered 2021-07-25: 150 ug/kg/min via INTRAVENOUS

## 2021-07-25 MED ORDER — SODIUM CHLORIDE 0.9 % IV SOLN: INTRAVENOUS | Status: DC

## 2021-07-25 NOTE — Op Note (Signed)
[facility] Gastroenterology Patient Name: James Arnold Procedure Date: 07/25/2021 7:36 AM MRN: [id number] Account #: [id number] Date of Birth: [birth year] Admit Type: Outpatient Age: 65 Room: [facility] ENDO ROOM 3 Gender: Male Note Status: Finalized Instrument Name: Upper Endoscope [id number] Procedure:             Upper GI endoscopy Indications:           Hematemesis Providers:             [name] MD, MD Referring MD:          Valerie Roys (Referring MD) Medicines:             Monitored Anesthesia Care Complications:         No immediate complications. Procedure:             Pre-Anesthesia Assessment:                        - Prior to the procedure, a History and Physical was                         performed, and patient medications, allergies and                         sensitivities were reviewed. The patient's tolerance                         of previous anesthesia was reviewed.                        - The risks and benefits of the procedure and the                         sedation options and risks were discussed with the                         patient. All questions were answered and informed                         consent was obtained.                        - ASA Grade Assessment: II - A patient with mild                         systemic disease.                        After obtaining informed consent, the endoscope was                         passed under direct vision. Throughout the procedure,                         the patient's blood pressure, pulse, and oxygen                         saturations were monitored continuously. The Endoscope                         was introduced through  the mouth, and advanced to the                         third part of duodenum. The upper GI endoscopy was                         accomplished with ease. The patient tolerated the                         procedure well. Findings:      LA Grade A (one or more  mucosal breaks less than 5 mm, not extending       between tops of 2 mucosal folds) esophagitis with no bleeding was found       at the gastroesophageal junction. Biopsies were taken with a cold       forceps for histology.      Patchy moderate inflammation characterized by congestion (edema) and       erythema was found in the gastric antrum and in the prepyloric region of       the stomach. Biopsies were taken with a cold forceps for histology.      Localized moderately congested mucosa without active bleeding and with       no stigmata of bleeding was found in the duodenal bulb. Biopsies were       taken with a cold forceps for histology.      The cardia and gastric fundus were normal on retroflexion. Impression:            - LA Grade A reflux esophagitis with no bleeding.                         Biopsied.                        - Gastritis. Biopsied.                        - Congested duodenal mucosa. Biopsied. Recommendation:        - Await pathology results.                        - Discharge patient to home (with escort).                        - Resume previous diet.                        - Continue present medications.                        - Await pathology results.                        - Return to my office as previously scheduled. Procedure Code(s):     --- Professional ---                        [phone], Esophagogastroduodenoscopy, flexible,                         transoral; with biopsy, single or multiple Diagnosis Code(s):     --- Professional ---  K21.00, Gastro-esophageal reflux disease with                         esophagitis, without bleeding                        K29.70, Gastritis, unspecified, without bleeding                        K31.89, Other diseases of stomach and duodenum                        K92.0, Hematemesis CPT copyright 2019 American Medical Association. All rights reserved. The codes documented in this report are preliminary  and upon coder review may  be revised to meet current compliance requirements. [name], MD [name] MD, MD 07/25/2021 8:07:28 AM This report has been signed electronically. Number of Addenda: 0 Note Initiated On: 07/25/2021 7:36 AM Estimated Blood Loss:  Estimated blood loss: none.      [facility]

## 2021-07-25 NOTE — Anesthesia Preprocedure Evaluation (Signed)
Anesthesia Evaluation  Patient identified by MRN, date of birth, ID band Patient awake    Reviewed: Allergy & Precautions, NPO status , Patient's Chart, lab work & pertinent test results  Airway Mallampati: II  TM Distance: >3 FB Neck ROM: full    Dental  (+) Upper Dentures, Lower Dentures   Pulmonary neg pulmonary ROS, COPD, Current Smoker and Patient abstained from smoking.,    Pulmonary exam normal  + decreased breath sounds      Cardiovascular Exercise Tolerance: Poor hypertension, Pt. on medications + CAD, + Past MI and + Peripheral Vascular Disease  negative cardio ROS Normal cardiovascular exam Rhythm:Regular Rate:Normal     Neuro/Psych Seizures -,  Depression TIAnegative neurological ROS  negative psych ROS   GI/Hepatic negative GI ROS, Neg liver ROS,   Endo/Other  negative endocrine ROS  Renal/GU Renal diseasenegative Renal ROS  negative genitourinary   Musculoskeletal negative musculoskeletal ROS (+)   Abdominal Normal abdominal exam  (+)   Peds negative pediatric ROS (+)  Hematology negative hematology ROS (+) Blood dyscrasia, anemia ,   Anesthesia Other Findings Past Medical History: No date: Anemia No date: Benign hypertensive renal disease No date: CAD (coronary artery disease)     Comment:  a. 08/1996 s/p BMS to the mLAD ([facility]); b. 12/2017 MV: EF               46%, fixed apical defect w/ significant GI uptake               artifact. No ischemia-> low risk; c. 07/2020 MV: EF 51%,               no ischemia/infarct. No date: Carotid arterial disease ([place])     Comment:  a. 11/2020 Carotid U/S: <50% bilat ICA stenoses. No date: CKD (chronic kidney disease), stage V ([place])     Comment:  on peritoneal dialysis No date: Complication of anesthesia     Comment:  please call by "[name]" when waking up!! No date: COPD (chronic obstructive pulmonary disease) ([facility])     Comment:  centrilobular emphysema. pt  is poorly controlled with               his copd/smoking. No date: Depression No date: Diastolic dysfunction     Comment:  a. 12/2017 Echo: EF 60-65%, no rwma, Gr1 DD, mild AI/MR.               Nl RVSP; b. 08/2020 Echo: EF 60-65%, no rwma, GrI DD, nl               RV fxn. RVSP 32.18mmHg. No date: Dyspnea on exertion     Comment:  with exertion No date: Fatigue No date: FSGS (focal segmental glomerulosclerosis) 10/2002: Hyperlipidemia No date: Hypertension No date: myelofibrosis     Comment:  bone marrow failure 2010: Myelofibrosis ([place])     Comment:  JAK2 (+) primary 1997: Myocardial infarction ([place])     Comment:  stent x 1 02/2021: Pneumoperitoneum No date: PSVT (paroxysmal supraventricular tachycardia) ([place])     Comment:  a. 12/2020 Zio: Sinus rhythm 80 (55-117). Rare PACs/PVCs.              17 episodes of SVT (longest 20.3 secs, fastest 184). No               triggered events. No afib. No date: Smoker No date: Stroke [facility])     Comment:  a. 11/2020 MRI Brain: patchy acute/early subacute  cortical infarcts w/in the R frontal, parietal, and               occiptal lobes. Small, chronic L basal ganglia lacunar               infarct. No date: Thrombocytopenia ([place])  Past Surgical History: No date: ANGIOPLASTY 03/2009: BONE MARROW ASPIRATION 1997: CARDIAC CATHETERIZATION     Comment:  stent x 1 08/09/2018: COLONOSCOPY WITH PROPOFOL; N/A     Comment:  Procedure: COLONOSCOPY WITH PROPOFOL;  Surgeon: [name], MD;  Location: [facility] ENDOSCOPY;  Service:               Gastroenterology;  Laterality: N/A; 08/1996: CORONARY STENT PLACEMENT No date: EXTERIORIZATION OF A CONTINUOUS AMBULATORY PERITONEAL  DIALYSIS CATHETER No date: EYE SURGERY; Bilateral     Comment:  cats removed 11/19/2020: REVERSE SHOULDER ARTHROPLASTY; Left     Comment:  Procedure: Left reverse shoulder arthroplasty;  Surgeon:              Leim Fabry, MD;  Location: [facility] ORS;  Service:                Orthopedics;  Laterality: Left;  BMI    Body Mass Index: 23.02 kg/m      Reproductive/Obstetrics negative OB ROS                             Anesthesia Physical Anesthesia Plan  ASA: 4  Anesthesia Plan: General   Post-op Pain Management:    Induction: Intravenous  PONV Risk Score and Plan: Propofol infusion and TIVA  Airway Management Planned: Nasal Cannula  Additional Equipment:   Intra-op Plan:   Post-operative Plan:   Informed Consent: I have reviewed the patients History and Physical, chart, labs and discussed the procedure including the risks, benefits and alternatives for the proposed anesthesia with the patient or authorized representative who has indicated his/her understanding and acceptance.     Dental Advisory Given  Plan Discussed with: CRNA and Surgeon  Anesthesia Plan Comments:         Anesthesia Quick Evaluation

## 2021-07-25 NOTE — H&P (Signed)
James Bellows, MD [address] [place] [employer], [place], [place], [address] [id number], [place], [place], [place], [address] Phone: [phone]  Fax: [phone]  Primary Care Physician:  Valerie Roys, DO   Pre-Procedure History & Physical: HPI:  James Arnold is a 65 y.o. male is here for an endoscopy    Past Medical History:  Diagnosis Date   Anemia    Benign hypertensive renal disease    CAD (coronary artery disease)    a. 08/1996 s/p BMS to the mLAD ([facility]); b. 12/2017 MV: EF 46%, fixed apical defect w/ significant GI uptake artifact. No ischemia-> low risk; c. 07/2020 MV: EF 51%, no ischemia/infarct.   Carotid arterial disease ([place])    a. 11/2020 Carotid U/S: <50% bilat ICA stenoses.   CKD (chronic kidney disease), stage V ([place])    on peritoneal dialysis   Complication of anesthesia    please call by "[name]" when waking up!!   COPD (chronic obstructive pulmonary disease) (HCC)    centrilobular emphysema. pt is poorly controlled with his copd/smoking.   Depression    Diastolic dysfunction    a. 12/2017 Echo: EF 60-65%, no rwma, Gr1 DD, mild AI/MR. Nl RVSP; b. 08/2020 Echo: EF 60-65%, no rwma, GrI DD, nl RV fxn. RVSP 32.71mmHg.   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 12/2020 Zio: Sinus rhythm 80 (55-117). Rare PACs/PVCs. 17 episodes of SVT (longest 20.3 secs, fastest 184). No triggered events. No afib.   Smoker    Stroke [facility])    a. 11/2020 MRI Brain: patchy acute/early subacute cortical infarcts w/in the R frontal, parietal, and occiptal lobes. Small, chronic L basal ganglia lacunar infarct.   Thrombocytopenia [facility])     Past Surgical History:   Procedure Laterality Date   ANGIOPLASTY     BONE MARROW ASPIRATION  03/2009   CARDIAC CATHETERIZATION  1997   stent x 1   COLONOSCOPY WITH PROPOFOL N/A 08/09/2018   Procedure: COLONOSCOPY WITH PROPOFOL;  Surgeon: James Bellows, MD;  Location: [facility] ENDOSCOPY;  Service: Gastroenterology;  Laterality: N/A;   CORONARY STENT PLACEMENT  08/1996   EXTERIORIZATION OF A CONTINUOUS AMBULATORY PERITONEAL DIALYSIS CATHETER     EYE SURGERY Bilateral    cats removed   REVERSE SHOULDER ARTHROPLASTY Left 11/19/2020   Procedure: Left reverse shoulder arthroplasty;  Surgeon: Leim Fabry, MD;  Location: [facility] ORS;  Service: Orthopedics;  Laterality: Left;    Prior to Admission medications   Medication Sig Start Date End Date Taking? Authorizing Provider  albuterol (VENTOLIN HFA) 108 (90 Base) MCG/ACT inhaler Inhale 2 puffs into the lungs every 6 (six) hours as needed for wheezing or shortness of breath. 02/27/21  Yes Borders, Kirt Boys, NP  aspirin 81 MG chewable tablet Chew 1 tablet (81 mg total) by mouth daily. 01/08/21  Yes Johnson, Megan P, DO  benzonatate (TESSALON) 200 MG capsule Take 1 capsule by mouth in the morning, at noon, and at bedtime. 07/21/21 07/28/21 Yes [provider]  carvedilol (COREG) 25 MG tablet Take 1 tablet (25 mg total) by mouth 2 (two) times daily. 06/13/21 09/11/21 Yes  Theora Gianotti, NP  Cholecalciferol (VITAMIN D-1000 MAX ST) 25 MCG (1000 UT) tablet Take 1,000 Units by mouth daily.    Yes [provider]  citalopram (CELEXA) 40 MG tablet Take 1 tablet (40 mg total) by mouth daily. 01/08/21  Yes Johnson, Megan P, DO  ferrous sulfate 325 (65 FE) MG tablet Take 1 tablet (325 mg total) by mouth daily with breakfast. 12/30/17 08/06/23 Yes Pyreddy, Reatha Harps, MD  furosemide (LASIX) 20 MG tablet Take 1 tablet (20 mg total) by mouth daily. 01/08/21  Yes Johnson, Megan P, DO  gentamicin cream (GARAMYCIN) 0.1 % Apply 1 application topically every other day. 03/18/19  Yes  [provider]  guaiFENesin (ROBITUSSIN) 100 MG/5ML liquid Take 5 mLs by mouth 3 (three) times daily as needed. 07/21/21  Yes [provider]  levETIRAcetam (KEPPRA) 500 MG tablet Take 1 tablet (500 mg total) by mouth at bedtime. 11/24/20  Yes Wieting, [name], MD  ondansetron (ZOFRAN ODT) 4 MG disintegrating tablet Take 1 tablet (4 mg total) by mouth every 8 (eight) hours as needed for nausea or vomiting. 07/10/21  Yes Johnson, Megan P, DO  pantoprazole (PROTONIX) 40 MG tablet Take 1 tablet (40 mg total) by mouth daily. 07/10/21 07/10/22 Yes Johnson, Megan P, DO  polyethylene glycol (MIRALAX / GLYCOLAX) 17 g packet Take 17 g by mouth daily as needed. 11/24/20  Yes Wieting, [name], MD  predniSONE (DELTASONE) 10 MG tablet Take 10 mg by mouth daily with breakfast.   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   Not working dt disability.    Has been placed on kidney transplant evaluation list, but does not qualify d/t smoking and myelofibrosis   Social Determinants of Health   Financial Resource Strain: Low Risk    Difficulty of Paying Living Expenses: Not hard at all  Food Insecurity: No Food Insecurity   Worried About Charity fundraiser in the Last Year: Never true   Ran Out of Food in the Last Year: Never true  Transportation Needs: No Data processing manager (Medical): No   Lack of Transportation (Non-Medical): No  Physical Activity: Inactive   Days of Exercise per Week: 0 days   Minutes of Exercise per Session: 0 min  Stress: No Stress Concern Present   Feeling of Stress : Not at all  Social Connections: Not on file  Intimate Partner Violence: Not on file    Review of Systems: See HPI, otherwise negative ROS  Physical Exam: BP (!) 146/68   Pulse 92   Temp 97.6 F (36.4 C) (Temporal)   Resp 20   Ht 5\' 7"  (1.702 m)   Wt 66.7 kg   SpO2 98%   BMI 23.02 kg/m  General:   Alert,  pleasant and cooperative in NAD Head:  Normocephalic and atraumatic. Neck:  Supple; no masses or thyromegaly. Lungs:  Clear throughout to auscultation, normal respiratory effort.    Heart:  +S1, +S2, Regular rate and rhythm, No edema. Abdomen:  Soft, nontender and nondistended. Normal bowel sounds, without guarding, and without rebound.   Neurologic:  Alert and  oriented x4;  grossly normal neurologically.  Impression/Plan: James Arnold is here for an endoscopy  to be performed for  evaluation of hematemesis    Risks, benefits, limitations, and alternatives regarding endoscopy have been reviewed with the patient.  Questions have been answered.  All parties agreeable.   James Bellows, MD  07/25/2021, 7:53 AM

## 2021-07-25 NOTE — Transfer of Care (Signed)
Immediate Anesthesia Transfer of Care Note  Patient: James Arnold  Procedure(s) Performed: Procedure(s): ESOPHAGOGASTRODUODENOSCOPY (EGD) WITH PROPOFOL (N/A)  Patient Location: PACU and Endoscopy Unit  Anesthesia Type:General  Level of Consciousness: sedated  Airway & Oxygen Therapy: Patient Spontanous Breathing and Patient connected to nasal cannula oxygen  Post-op Assessment: Report given to RN and Post -op Vital signs reviewed and stable  Post vital signs: Reviewed and stable  Last Vitals:  Vitals:   07/25/21 0810 07/25/21 0812  BP: (!) 133/115 (!) 96/46  Pulse: 75 74  Resp: (!) 23 15  Temp:    SpO2: 65% 79%    Complications: No apparent anesthesia complications

## 2021-07-25 NOTE — Anesthesia Postprocedure Evaluation (Signed)
Anesthesia Post Note  Patient: James Arnold  Procedure(s) Performed: ESOPHAGOGASTRODUODENOSCOPY (EGD) WITH PROPOFOL  Patient location during evaluation: PACU Anesthesia Type: General Level of consciousness: awake, awake and alert and oriented Pain management: satisfactory to patient Vital Signs Assessment: post-procedure vital signs reviewed and stable Respiratory status: spontaneous breathing and respiratory function stable Cardiovascular status: blood pressure returned to baseline Anesthetic complications: no   No notable events documented.   Last Vitals:  Vitals:   07/25/21 0820 07/25/21 0830  BP: (!) 104/58 (!) 120/58  Pulse: 70 66  Resp: 16 19  Temp:    SpO2: 100% 99%    Last Pain:  Vitals:   07/25/21 0800  TempSrc: Temporal  PainSc:                  VAN STAVEREN,Shaquana Buel

## 2021-07-26 ENCOUNTER — Encounter: Payer: Self-pay | Admitting: Gastroenterology

## 2021-07-26 DIAGNOSIS — Z992 Dependence on renal dialysis: Secondary | ICD-10-CM | POA: Diagnosis not present

## 2021-07-26 DIAGNOSIS — N186 End stage renal disease: Secondary | ICD-10-CM | POA: Diagnosis not present

## 2021-07-26 LAB — SURGICAL PATHOLOGY

## 2021-07-28 DIAGNOSIS — N186 End stage renal disease: Secondary | ICD-10-CM | POA: Diagnosis not present

## 2021-07-28 DIAGNOSIS — Z992 Dependence on renal dialysis: Secondary | ICD-10-CM | POA: Diagnosis not present

## 2021-07-29 DIAGNOSIS — Z992 Dependence on renal dialysis: Secondary | ICD-10-CM | POA: Diagnosis not present

## 2021-07-29 DIAGNOSIS — N186 End stage renal disease: Secondary | ICD-10-CM | POA: Diagnosis not present

## 2021-07-30 DIAGNOSIS — N186 End stage renal disease: Secondary | ICD-10-CM | POA: Diagnosis not present

## 2021-07-30 DIAGNOSIS — Z992 Dependence on renal dialysis: Secondary | ICD-10-CM | POA: Diagnosis not present

## 2021-07-31 DIAGNOSIS — Z992 Dependence on renal dialysis: Secondary | ICD-10-CM | POA: Diagnosis not present

## 2021-07-31 DIAGNOSIS — N186 End stage renal disease: Secondary | ICD-10-CM | POA: Diagnosis not present

## 2021-08-01 DIAGNOSIS — N186 End stage renal disease: Secondary | ICD-10-CM | POA: Diagnosis not present

## 2021-08-01 DIAGNOSIS — Z992 Dependence on renal dialysis: Secondary | ICD-10-CM | POA: Diagnosis not present

## 2021-08-02 DIAGNOSIS — N186 End stage renal disease: Secondary | ICD-10-CM | POA: Diagnosis not present

## 2021-08-02 DIAGNOSIS — Z992 Dependence on renal dialysis: Secondary | ICD-10-CM | POA: Diagnosis not present

## 2021-08-04 DIAGNOSIS — N186 End stage renal disease: Secondary | ICD-10-CM | POA: Diagnosis not present

## 2021-08-04 DIAGNOSIS — Z992 Dependence on renal dialysis: Secondary | ICD-10-CM | POA: Diagnosis not present

## 2021-08-05 DIAGNOSIS — L814 Other melanin hyperpigmentation: Secondary | ICD-10-CM | POA: Diagnosis not present

## 2021-08-05 DIAGNOSIS — C44311 Basal cell carcinoma of skin of nose: Secondary | ICD-10-CM | POA: Diagnosis not present

## 2021-08-05 DIAGNOSIS — L578 Other skin changes due to chronic exposure to nonionizing radiation: Secondary | ICD-10-CM | POA: Diagnosis not present

## 2021-08-05 DIAGNOSIS — Z992 Dependence on renal dialysis: Secondary | ICD-10-CM | POA: Diagnosis not present

## 2021-08-05 DIAGNOSIS — N186 End stage renal disease: Secondary | ICD-10-CM | POA: Diagnosis not present

## 2021-08-05 DIAGNOSIS — L988 Other specified disorders of the skin and subcutaneous tissue: Secondary | ICD-10-CM | POA: Diagnosis not present

## 2021-08-06 DIAGNOSIS — N186 End stage renal disease: Secondary | ICD-10-CM | POA: Diagnosis not present

## 2021-08-06 DIAGNOSIS — Z992 Dependence on renal dialysis: Secondary | ICD-10-CM | POA: Diagnosis not present

## 2021-08-07 DIAGNOSIS — Z992 Dependence on renal dialysis: Secondary | ICD-10-CM | POA: Diagnosis not present

## 2021-08-07 DIAGNOSIS — N186 End stage renal disease: Secondary | ICD-10-CM | POA: Diagnosis not present

## 2021-08-08 DIAGNOSIS — Z992 Dependence on renal dialysis: Secondary | ICD-10-CM | POA: Diagnosis not present

## 2021-08-08 DIAGNOSIS — N186 End stage renal disease: Secondary | ICD-10-CM | POA: Diagnosis not present

## 2021-08-09 DIAGNOSIS — Z992 Dependence on renal dialysis: Secondary | ICD-10-CM | POA: Diagnosis not present

## 2021-08-09 DIAGNOSIS — N186 End stage renal disease: Secondary | ICD-10-CM | POA: Diagnosis not present

## 2021-08-11 DIAGNOSIS — N186 End stage renal disease: Secondary | ICD-10-CM | POA: Diagnosis not present

## 2021-08-11 DIAGNOSIS — Z992 Dependence on renal dialysis: Secondary | ICD-10-CM | POA: Diagnosis not present

## 2021-08-12 DIAGNOSIS — N186 End stage renal disease: Secondary | ICD-10-CM | POA: Diagnosis not present

## 2021-08-12 DIAGNOSIS — Z992 Dependence on renal dialysis: Secondary | ICD-10-CM | POA: Diagnosis not present

## 2021-08-13 ENCOUNTER — Telehealth: Payer: Self-pay

## 2021-08-13 DIAGNOSIS — N186 End stage renal disease: Secondary | ICD-10-CM | POA: Diagnosis not present

## 2021-08-13 DIAGNOSIS — Z992 Dependence on renal dialysis: Secondary | ICD-10-CM | POA: Diagnosis not present

## 2021-08-13 NOTE — Telephone Encounter (Signed)
-----   Message from [name], MD sent at 08/05/2021  8:21 AM EST ----- Biopsies show features of gastritis and duodenitis likely due to iron pills

## 2021-08-13 NOTE — Telephone Encounter (Signed)
Patient was contacted with the below information and he stated that unfortunately he has to continue taking his iron pills. Patient had no further comments or questions.

## 2021-08-14 DIAGNOSIS — N186 End stage renal disease: Secondary | ICD-10-CM | POA: Diagnosis not present

## 2021-08-14 DIAGNOSIS — Z992 Dependence on renal dialysis: Secondary | ICD-10-CM | POA: Diagnosis not present

## 2021-08-15 DIAGNOSIS — N186 End stage renal disease: Secondary | ICD-10-CM | POA: Diagnosis not present

## 2021-08-15 DIAGNOSIS — Z992 Dependence on renal dialysis: Secondary | ICD-10-CM | POA: Diagnosis not present

## 2021-08-16 DIAGNOSIS — Z992 Dependence on renal dialysis: Secondary | ICD-10-CM | POA: Diagnosis not present

## 2021-08-16 DIAGNOSIS — N186 End stage renal disease: Secondary | ICD-10-CM | POA: Diagnosis not present

## 2021-08-18 DIAGNOSIS — N186 End stage renal disease: Secondary | ICD-10-CM | POA: Diagnosis not present

## 2021-08-18 DIAGNOSIS — Z992 Dependence on renal dialysis: Secondary | ICD-10-CM | POA: Diagnosis not present

## 2021-08-19 DIAGNOSIS — Z992 Dependence on renal dialysis: Secondary | ICD-10-CM | POA: Diagnosis not present

## 2021-08-19 DIAGNOSIS — N186 End stage renal disease: Secondary | ICD-10-CM | POA: Diagnosis not present

## 2021-08-20 DIAGNOSIS — N186 End stage renal disease: Secondary | ICD-10-CM | POA: Diagnosis not present

## 2021-08-20 DIAGNOSIS — Z992 Dependence on renal dialysis: Secondary | ICD-10-CM | POA: Diagnosis not present

## 2021-08-21 ENCOUNTER — Ambulatory Visit: Payer: Medicaid Other | Admitting: Family Medicine

## 2021-08-21 DIAGNOSIS — Z992 Dependence on renal dialysis: Secondary | ICD-10-CM | POA: Diagnosis not present

## 2021-08-21 DIAGNOSIS — N186 End stage renal disease: Secondary | ICD-10-CM | POA: Diagnosis not present

## 2021-08-22 DIAGNOSIS — N186 End stage renal disease: Secondary | ICD-10-CM | POA: Diagnosis not present

## 2021-08-22 DIAGNOSIS — Z992 Dependence on renal dialysis: Secondary | ICD-10-CM | POA: Diagnosis not present

## 2021-08-23 DIAGNOSIS — N186 End stage renal disease: Secondary | ICD-10-CM | POA: Diagnosis not present

## 2021-08-23 DIAGNOSIS — Z992 Dependence on renal dialysis: Secondary | ICD-10-CM | POA: Diagnosis not present

## 2021-08-25 DIAGNOSIS — N186 End stage renal disease: Secondary | ICD-10-CM | POA: Diagnosis not present

## 2021-08-25 DIAGNOSIS — Z992 Dependence on renal dialysis: Secondary | ICD-10-CM | POA: Diagnosis not present

## 2021-08-26 DIAGNOSIS — Z992 Dependence on renal dialysis: Secondary | ICD-10-CM | POA: Diagnosis not present

## 2021-08-26 DIAGNOSIS — N186 End stage renal disease: Secondary | ICD-10-CM | POA: Diagnosis not present

## 2021-08-27 DIAGNOSIS — N186 End stage renal disease: Secondary | ICD-10-CM | POA: Diagnosis not present

## 2021-08-27 DIAGNOSIS — Z992 Dependence on renal dialysis: Secondary | ICD-10-CM | POA: Diagnosis not present

## 2021-08-28 ENCOUNTER — Inpatient Hospital Stay: Payer: Medicare HMO

## 2021-08-28 ENCOUNTER — Inpatient Hospital Stay (HOSPITAL_BASED_OUTPATIENT_CLINIC_OR_DEPARTMENT_OTHER): Payer: Medicare HMO | Admitting: Internal Medicine

## 2021-08-28 ENCOUNTER — Encounter: Payer: Self-pay | Admitting: Internal Medicine

## 2021-08-28 ENCOUNTER — Inpatient Hospital Stay: Payer: Medicare HMO | Attending: Internal Medicine

## 2021-08-28 ENCOUNTER — Other Ambulatory Visit: Payer: Self-pay

## 2021-08-28 DIAGNOSIS — R161 Splenomegaly, not elsewhere classified: Secondary | ICD-10-CM | POA: Diagnosis not present

## 2021-08-28 DIAGNOSIS — Z800 Family history of malignant neoplasm of digestive organs: Secondary | ICD-10-CM | POA: Insufficient documentation

## 2021-08-28 DIAGNOSIS — N186 End stage renal disease: Secondary | ICD-10-CM | POA: Diagnosis not present

## 2021-08-28 DIAGNOSIS — I120 Hypertensive chronic kidney disease with stage 5 chronic kidney disease or end stage renal disease: Secondary | ICD-10-CM | POA: Insufficient documentation

## 2021-08-28 DIAGNOSIS — Z801 Family history of malignant neoplasm of trachea, bronchus and lung: Secondary | ICD-10-CM | POA: Insufficient documentation

## 2021-08-28 DIAGNOSIS — Z8041 Family history of malignant neoplasm of ovary: Secondary | ICD-10-CM | POA: Diagnosis not present

## 2021-08-28 DIAGNOSIS — R109 Unspecified abdominal pain: Secondary | ICD-10-CM | POA: Diagnosis not present

## 2021-08-28 DIAGNOSIS — F17210 Nicotine dependence, cigarettes, uncomplicated: Secondary | ICD-10-CM | POA: Diagnosis not present

## 2021-08-28 DIAGNOSIS — D631 Anemia in chronic kidney disease: Secondary | ICD-10-CM | POA: Diagnosis not present

## 2021-08-28 DIAGNOSIS — N184 Chronic kidney disease, stage 4 (severe): Secondary | ICD-10-CM | POA: Insufficient documentation

## 2021-08-28 DIAGNOSIS — Z803 Family history of malignant neoplasm of breast: Secondary | ICD-10-CM | POA: Diagnosis not present

## 2021-08-28 DIAGNOSIS — D471 Chronic myeloproliferative disease: Secondary | ICD-10-CM

## 2021-08-28 DIAGNOSIS — Z992 Dependence on renal dialysis: Secondary | ICD-10-CM | POA: Diagnosis not present

## 2021-08-28 LAB — COMPREHENSIVE METABOLIC PANEL
ALT: 11 U/L (ref 0–44)
AST: 12 U/L — ABNORMAL LOW (ref 15–41)
Albumin: 3.2 g/dL — ABNORMAL LOW (ref 3.5–5.0)
Alkaline Phosphatase: 46 U/L (ref 38–126)
Anion gap: 12 (ref 5–15)
BUN: 56 mg/dL — ABNORMAL HIGH (ref 8–23)
CO2: 24 mmol/L (ref 22–32)
Calcium: 8 mg/dL — ABNORMAL LOW (ref 8.9–10.3)
Chloride: 102 mmol/L (ref 98–111)
Creatinine, Ser: 7.47 mg/dL — ABNORMAL HIGH (ref 0.61–1.24)
GFR, Estimated: 7 mL/min — ABNORMAL LOW (ref >60)
Glucose, Bld: 89 mg/dL (ref 70–99)
Potassium: 4.1 mmol/L (ref 3.5–5.1)
Sodium: 138 mmol/L (ref 135–145)
Total Bilirubin: 0.6 mg/dL (ref 0.3–1.2)
Total Protein: 6 g/dL — ABNORMAL LOW (ref 6.5–8.1)

## 2021-08-28 LAB — CBC WITH DIFFERENTIAL/PLATELET
Abs Immature Granulocytes: 0.02 10*3/uL (ref 0.00–0.07)
Basophils Absolute: 0 10*3/uL (ref 0.0–0.1)
Basophils Relative: 0 %
Eosinophils Absolute: 0 10*3/uL (ref 0.0–0.5)
Eosinophils Relative: 1 %
HCT: 31.2 % — ABNORMAL LOW (ref 39.0–52.0)
Hemoglobin: 9.7 g/dL — ABNORMAL LOW (ref 13.0–17.0)
Immature Granulocytes: 1 %
Lymphocytes Relative: 18 %
Lymphs Abs: 0.7 10*3/uL (ref 0.7–4.0)
MCH: 28.8 pg (ref 26.0–34.0)
MCHC: 31.1 g/dL (ref 30.0–36.0)
MCV: 92.6 fL (ref 80.0–100.0)
Monocytes Absolute: 0.2 10*3/uL (ref 0.1–1.0)
Monocytes Relative: 4 %
Neutro Abs: 3.1 10*3/uL (ref 1.7–7.7)
Neutrophils Relative %: 76 %
Platelets: 121 10*3/uL — ABNORMAL LOW (ref 150–400)
RBC: 3.37 MIL/uL — ABNORMAL LOW (ref 4.22–5.81)
RDW: 16.1 % — ABNORMAL HIGH (ref 11.5–15.5)
WBC: 4 10*3/uL (ref 4.0–10.5)
nRBC: 0 % (ref 0.0–0.2)

## 2021-08-28 LAB — SAMPLE TO BLOOD BANK

## 2021-08-28 LAB — LACTATE DEHYDROGENASE: LDH: 570 U/L — ABNORMAL HIGH (ref 98–192)

## 2021-08-28 NOTE — Assessment & Plan Note (Addendum)
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     # OCT 1st, 2022-US Splenomegaly with increase in splenic size from prior imaging, current splenic volume 1024cc; patient symptomatic from fatigue/question abdominal discomfort from splenomegaly versus other [see below].  Discussed regarding use of Hydrea decrease size of spleen. Recommend Hydrea 500 mg once a day. Given the potential side effects including but not limited to fatigue; mild to moderate nausea; bone marrow toxicity diarrhea skin rash etc.  Patient and his wife will review and get back to me.    # Abdominal pain/egigastric pain/hematemesis- cut prendisone 5 mg/day; and Take PPI Twice a day; before meals; s/p EGD- no evidence if bleeding noted.  See plan above.  #Anemia hemoglobin- multifactorial is 9-10. Hb today- 9.7-STABLE.  Continue EPO stimulating agent /IV iron as per nephrology.   #COPD-poorly controlled; declines PFTs;on prednisone 10 mg a day- see below-  STABLE;   #End-stage renal disease [Dr. Latif]  on PD-STABLE;    # COVID vaccine: DECLINED Covid-19 vaccine.   I spoke at length with the patient's wife- regarding the patient's clinical status/plan of care.  Family agreement.   # DISPOSITION: mychart apt. # HOLD PRBC transfusion # follow up in  second week of Jan -labs- MD-CBC/CMP;LDH;HOLD tube; posssible1 unit PRBC tranfusion- Dr.B

## 2021-08-28 NOTE — Progress Notes (Signed)
[place] OFFICE PROGRESS NOTE  Patient Care Team: Valerie Roys, DO as PCP - General (Family Medicine) End, Harrell Gave, MD as PCP - Cardiology (Cardiology) Cammie Sickle, MD as Consulting Physician (Internal Medicine) Dasher, Rayvon Char, MD as Consulting Physician (Dermatology)   Cancer Staging  No matching staging information was found for the patient.   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   # AUGUST 1st week 2019- Retacrit   # October 2nd 2019- jakafi 10 BID [? Renal involvement]; September 2020-peritoneal dialysis; Jakafi 10 mg in the morning; STOPPED/tapered FUXN2TF, 2021 [sec intolerance fatigue/myalgias]  # May 4th 2022- START [name] dosing/PD]; STOPPED after2-3 weeks-because of poor tolerance [sever fatigue/? 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 [name]- kidney Bx- FSG [ on Prednisone Dr.[name]]; July 2020-peritoneal dialysis;   # hx of Lung nodules- resolved [[name]]/quit smoking.  DIAGNOSIS: PRIMARY MYELOFIBROSIS  RISK:LOW     ;GOALS: Control  CURRENT/MOST RECENT THERAPY : Surveillance    Primary myelofibrosis ([place])      INTERVAL HISTORY:Ambulating independently.  He is accompanied by his wife.  James Arnold 65 y.o.  male pleasant patient above history of primary myelofibrosis Jak 2+ currently off [place] surveillance chronic kidney disease on peritoneal dialysis is here for follow-up.  In the interim patient underwent upper endoscopy with Dr. Sharlette Dense episode of hematemesis.   Continues to complain of abdominal discomfort.  Epigastric region.  He continues with Prilosec twice a day.  Chronic fatigue.  Appetite  is good.  Denies any early satiety.  No chills.  Review of Systems  Constitutional:  Positive for malaise/fatigue. Negative for chills, diaphoresis and fever.  HENT:  Negative for nosebleeds and sore throat.   Eyes:  Negative for double vision.  Respiratory:  Positive for cough and shortness of breath. Negative for hemoptysis, sputum production and wheezing.   Cardiovascular:  Negative for chest pain, palpitations and orthopnea.  Gastrointestinal:  Negative for abdominal pain, blood in stool, constipation, diarrhea, heartburn, melena, nausea and vomiting.  Genitourinary:  Negative for dysuria, frequency and urgency.  Musculoskeletal:  Positive for back pain and joint pain.  Skin: Negative.  Negative for itching and rash.  Neurological:  Positive for dizziness and tingling. Negative for focal weakness, weakness and headaches.  Endo/Heme/Allergies:  Does not bruise/bleed easily.  Psychiatric/Behavioral:  Negative for depression. The patient is not nervous/anxious and does not have insomnia.      PAST MEDICAL HISTORY :  Past Medical History:  Diagnosis Date  . Anemia   . Benign hypertensive renal disease   . CAD (coronary artery disease)    a. 08/1996 s/p BMS to the mLAD ([facility]); b. 12/2017 MV: EF 46%, fixed apical defect w/ significant GI uptake artifact. No ischemia-> low risk; c. 07/2020 MV: EF 51%, no ischemia/infarct.  . Carotid arterial disease ([place])    a. 11/2020 Carotid U/S: <50% bilat ICA stenoses.  . CKD (chronic kidney disease), stage V ([place])    on peritoneal dialysis  . Complication of anesthesia    please call by "[name]" when waking up!!  . COPD (chronic obstructive pulmonary disease) ([place])    centrilobular emphysema. pt is poorly controlled with his copd/smoking.  . Depression   . Diastolic dysfunction  a. 12/2017 Echo: EF 60-65%, no rwma, Gr1 DD, mild AI/MR. Nl RVSP; b. 08/2020 Echo: EF 60-65%, no rwma, GrI DD, nl RV fxn. RVSP 32.62mHg.  .[name] KitchenDyspnea on exertion    with  exertion  . Fatigue   . FSGS (focal segmental glomerulosclerosis)   . Hyperlipidemia 10/2002  . Hypertension   . myelofibrosis    bone marrow failure  . [place] ([place] 2010   JAK2 (+) primary  . [place]   stent x 1  . Pneumoperitoneum 02/2021  . PSVT ([place]oxysmal [place]ventricular tachycardia) ([place]    a. 12/2020 Zio: Sinus rhythm 80 (55-117). Rare PACs/PVCs. 17 episodes of SVT (longest 20.3 secs, fastest 184). No triggered events. No afib.  . Smoker   . Stroke ([facility]    a. 11/2020 MRI Brain: patchy acute/early subacute cortical infarcts w/in the R frontal, parietal, and occiptal lobes. Small, chronic L basal ganglia lacunar infarct.  . [place]     PAST SURGICAL HISTORY :   Past Surgical History:  Procedure Laterality Date  . ANGIOPLASTY    . BONE MARROW ASPIRATION  03/2009  . CARDIAC CATHETERIZATION  1997   stent x 1  . COLONOSCOPY WITH PROPOFOL N/A 08/09/2018   Procedure: COLONOSCOPY WITH PROPOFOL;  Surgeon: AJonathon Bellows MD;  [facility]  Service: Gastroenterology;  Laterality: N/A;  . CORONARY STENT PLACEMENT  08/1996  . ESOPHAGOGASTRODUODENOSCOPY (EGD) WITH PROPOFOL N/A 07/25/2021   Procedure: ESOPHAGOGASTRODUODENOSCOPY (EGD) WITH PROPOFOL;  Surgeon: AJonathon Bellows MD;  [facility];  Service: Gastroenterology;  Laterality: N/A;  . EXTERIORIZATION OF A CONTINUOUS AMBULATORY PERITONEAL DIALYSIS CATHETER    . EYE SURGERY Bilateral    cats removed  . REVERSE SHOULDER ARTHROPLASTY Left 11/19/2020   Procedure: Left reverse shoulder arthroplasty;  Surgeon: PLeim Fabry MD;  Location: [facility] ORS;  Service: Orthopedics;  Laterality: Left;    FAMILY HISTORY :   Family History  Problem Relation Age of Onset  . Stroke Mother        Low BP stroke  . Depression Father   . Depression Sister        Breast  . Heart disease Other   . Breast cancer Other   . Lung cancer Other   . Ovarian cancer Other   . Stomach cancer  Other     SOCIAL HISTORY:   Social History   Tobacco Use  . Smoking status: Every Day    Packs/day: 0.50    Years: 47.00    Pack years: 23.50    Types: Cigarettes  . Smokeless tobacco: Never  . Tobacco comments:    trying to cut back-smokes 6 to 7 cigs per day  Vaping Use  . Vaping Use: Never used  Substance Use Topics  . Alcohol use: Not Currently    Comment: "6 beers weekly on Saturdays"  . Drug use: No    ALLERGIES:  has No Known Allergies.  MEDICATIONS:  Current Outpatient Medications  Medication Sig Dispense Refill  . albuterol (VENTOLIN HFA) 108 (90 Base) MCG/ACT inhaler Inhale 2 puffs into the lungs every 6 (six) hours as needed for wheezing or shortness of breath. 8 g 2  . aspirin 81 MG chewable tablet Chew 1 tablet (81 mg total) by mouth daily. 90 tablet 1  . carvedilol (COREG) 25 MG tablet Take 1 tablet (25 mg total) by mouth 2 (two) times daily. 180 tablet 3  . Cholecalciferol (VITAMIN D-1000 MAX ST) 25 MCG (1000 UT) tablet Take 1,000 Units by  mouth daily.     . citalopram (CELEXA) 40 MG tablet Take 1 tablet (40 mg total) by mouth daily. 90 tablet 1  . ferrous sulfate 325 (65 FE) MG tablet Take 1 tablet (325 mg total) by mouth daily with breakfast. 30 tablet 1  . furosemide (LASIX) 20 MG tablet Take 1 tablet (20 mg total) by mouth daily. 90 tablet 1  . gentamicin cream (GARAMYCIN) 0.1 % Apply 1 application topically every other day.    [name] Kitchen guaiFENesin (ROBITUSSIN) 100 MG/5ML liquid Take 5 mLs by mouth 3 (three) times daily as needed.    . levETIRAcetam (KEPPRA) 500 MG tablet Take 1 tablet (500 mg total) by mouth at bedtime. 30 tablet 0  . pantoprazole (PROTONIX) 40 MG tablet Take 1 tablet (40 mg total) by mouth daily. 90 tablet 1  . predniSONE (DELTASONE) 10 MG tablet Take 10 mg by mouth daily with breakfast.    . ondansetron (ZOFRAN ODT) 4 MG disintegrating tablet Take 1 tablet (4 mg total) by mouth every 8 (eight) hours as needed for nausea or vomiting. (Patient not  taking: Reported on 08/28/2021) 12 tablet 0  . polyethylene glycol (MIRALAX / GLYCOLAX) 17 g packet Take 17 g by mouth daily as needed. (Patient not taking: Reported on 08/28/2021) 14 each 0  . tamsulosin (FLOMAX) 0.4 MG CAPS capsule Take 0.4 mg by mouth daily.     No current facility-administered medications for this visit.    PHYSICAL EXAMINATION: ECOG PERFORMANCE STATUS: 1 - Symptomatic but completely ambulatory  BP 131/71 (BP Location: Right Arm, Patient Position: Sitting)   Pulse 65   Temp 97.6 F (36.4 C) (Tympanic)   Resp 18   Wt 153 lb (69.4 kg)   SpO2 100%   BMI 23.96 kg/m   Filed Weights   08/28/21 0923  Weight: 153 lb (69.4 kg)    Physical Exam HENT:     Head: Normocephalic and atraumatic.     Mouth/Throat:     Pharynx: No oropharyngeal exudate.  Eyes:     Pupils: Pupils are equal, round, and reactive to light.  Cardiovascular:     Rate and Rhythm: Normal rate and regular rhythm.  Pulmonary:     Effort: No respiratory distress.     Breath sounds: No wheezing.     Comments: Decreased air entry bilaterally. Abdominal:     General: Bowel sounds are normal. There is no distension.     Palpations: Abdomen is soft. There is no mass.     Tenderness: There is no abdominal tenderness. There is no guarding or rebound.     Comments: Positive for splenomegaly.  Musculoskeletal:        General: No tenderness. Normal range of motion.     Cervical back: Normal range of motion and neck supple.  Skin:    General: Skin is warm.  Neurological:     Mental Status: He is alert and oriented to person, place, and time.     Comments: Left upper extremity weakness noted.    Psychiatric:        Mood and Affect: Affect normal.    LABORATORY DATA:  I have reviewed the data as listed    Component Value Date/Time   NA 138 08/28/2021 0902   NA 140 07/10/2021 0828   K 4.1 08/28/2021 0902   CL 102 08/28/2021 0902   CO2 24 08/28/2021 0902   GLUCOSE 89 08/28/2021 0902   BUN 56  (H) 08/28/2021 0902   BUN 41 (H)  07/10/2021 0828   CREATININE 7.47 (H) 08/28/2021 0902   CREATININE 1.12 09/30/2013 0953   CALCIUM 8.0 (L) 08/28/2021 0902   PROT 6.0 (L) 08/28/2021 0902   PROT 5.5 (L) 07/10/2021 0828   ALBUMIN 3.2 (L) 08/28/2021 0902   ALBUMIN 3.6 (L) 07/10/2021 0828   AST 12 (L) 08/28/2021 0902   ALT 11 08/28/2021 0902   ALKPHOS 46 08/28/2021 0902   BILITOT 0.6 08/28/2021 0902   BILITOT 0.3 07/10/2021 0828   GFRNONAA 7 (L) 08/28/2021 0902   GFRNONAA >60 09/30/2013 0953   GFRAA 14 (L) 07/10/2020 0909   GFRAA >60 09/30/2013 0953    No results found for: SPEP, UPEP  Lab Results  Component Value Date   WBC 4.0 08/28/2021   NEUTROABS 3.1 08/28/2021   HGB 9.7 (L) 08/28/2021   HCT 31.2 (L) 08/28/2021   MCV 92.6 08/28/2021   PLT 121 (L) 08/28/2021      Chemistry      Component Value Date/Time   NA 138 08/28/2021 0902   NA 140 07/10/2021 0828   K 4.1 08/28/2021 0902   CL 102 08/28/2021 0902   CO2 24 08/28/2021 0902   BUN 56 (H) 08/28/2021 0902   BUN 41 (H) 07/10/2021 0828   CREATININE 7.47 (H) 08/28/2021 0902   CREATININE 1.12 09/30/2013 0953      Component Value Date/Time   CALCIUM 8.0 (L) 08/28/2021 0902   ALKPHOS 46 08/28/2021 0902   AST 12 (L) 08/28/2021 0902   ALT 11 08/28/2021 0902   BILITOT 0.6 08/28/2021 0902   BILITOT 0.3 07/10/2021 0828       RADIOGRAPHIC STUDIES: I have personally reviewed the radiological images as listed and agreed with the findings in the report. No results found.   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     # OCT 1st, 2022-US Splenomegaly with increase in splenic size from prior imaging, current splenic volume 1024cc; patient symptomatic from fatigue/question abdominal discomfort from splenomegaly versus other [see below].  Discussed regarding use of Hydrea decrease  size of spleen. Recommend Hydrea 500 mg once a day. Given the potential side effects including but not limited to fatigue; mild to moderate nausea; bone marrow toxicity diarrhea skin rash etc.  Patient and his wife will review and get back to me.    # Abdominal pain/egigastric pain/hematemesis- cut prendisone 5 mg/day; and Take PPI Twice a day; before meals; s/p EGD- no evidence if bleeding noted.  See plan above.  #Anemia hemoglobin- multifactorial is 9-10. Hb today- 9.7-STABLE.  Continue EPO stimulating agent /IV iron as per nephrology.   #COPD-poorly controlled; declines PFTs;on prednisone 10 mg a day- see below-  STABLE;   #End-stage renal disease [Dr. Latif]  on PD-STABLE;    # COVID vaccine: DECLINED Covid-19 vaccine.   I spoke at length with the patient's wife- regarding the patient's clinical status/plan of care.  Family agreement.   # DISPOSITION: mychart apt. # HOLD PRBC transfusion # follow up in  second week of Jan -labs- MD-CBC/CMP;LDH;HOLD tube; posssible1 unit PRBC tranfusion- Dr.B  No orders of the defined types were placed in this encounter.  All questions were answered. The patient knows to call the clinic with any problems, questions or concerns.     Cammie Sickle, MD 08/28/2021 10:01 AM

## 2021-08-28 NOTE — Progress Notes (Signed)
Pt in for follow up, wife also present.  Pt reports increased fatigue, weakness, more short of breath and increased congestion.

## 2021-08-29 DIAGNOSIS — Z992 Dependence on renal dialysis: Secondary | ICD-10-CM | POA: Diagnosis not present

## 2021-08-29 DIAGNOSIS — N186 End stage renal disease: Secondary | ICD-10-CM | POA: Diagnosis not present

## 2021-08-30 DIAGNOSIS — N186 End stage renal disease: Secondary | ICD-10-CM | POA: Diagnosis not present

## 2021-08-30 DIAGNOSIS — Z992 Dependence on renal dialysis: Secondary | ICD-10-CM | POA: Diagnosis not present

## 2021-09-01 DIAGNOSIS — Z992 Dependence on renal dialysis: Secondary | ICD-10-CM | POA: Diagnosis not present

## 2021-09-01 DIAGNOSIS — N186 End stage renal disease: Secondary | ICD-10-CM | POA: Diagnosis not present

## 2021-09-02 DIAGNOSIS — N186 End stage renal disease: Secondary | ICD-10-CM | POA: Diagnosis not present

## 2021-09-02 DIAGNOSIS — Z992 Dependence on renal dialysis: Secondary | ICD-10-CM | POA: Diagnosis not present

## 2021-09-03 ENCOUNTER — Ambulatory Visit: Payer: Self-pay

## 2021-09-03 ENCOUNTER — Ambulatory Visit: Payer: Medicare HMO | Admitting: Gastroenterology

## 2021-09-03 DIAGNOSIS — Z992 Dependence on renal dialysis: Secondary | ICD-10-CM | POA: Diagnosis not present

## 2021-09-03 DIAGNOSIS — N186 End stage renal disease: Secondary | ICD-10-CM | POA: Diagnosis not present

## 2021-09-03 NOTE — Telephone Encounter (Signed)
Can we please see if we can get him in sooner to make sure this is not a medication reaction or UTI
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
## 2021-09-04 DIAGNOSIS — Z992 Dependence on renal dialysis: Secondary | ICD-10-CM | POA: Diagnosis not present

## 2021-09-04 DIAGNOSIS — N186 End stage renal disease: Secondary | ICD-10-CM | POA: Diagnosis not present

## 2021-09-04 NOTE — Telephone Encounter (Signed)
Appointment has been rescheduled for 09/09/2021.

## 2021-09-04 NOTE — Telephone Encounter (Signed)
Called and spoke with the pt's wife to get pt rescheduled a sooner appt.  Wife will call back to reschedule.

## 2021-09-05 DIAGNOSIS — Z992 Dependence on renal dialysis: Secondary | ICD-10-CM | POA: Diagnosis not present

## 2021-09-05 DIAGNOSIS — N186 End stage renal disease: Secondary | ICD-10-CM | POA: Diagnosis not present

## 2021-09-06 ENCOUNTER — Ambulatory Visit (INDEPENDENT_AMBULATORY_CARE_PROVIDER_SITE_OTHER): Payer: Medicare HMO | Admitting: Family Medicine

## 2021-09-06 ENCOUNTER — Other Ambulatory Visit: Payer: Self-pay

## 2021-09-06 ENCOUNTER — Encounter: Payer: Self-pay | Admitting: Family Medicine

## 2021-09-06 VITALS — BP 128/69 | HR 80 | Temp 98.0°F | Ht 66.0 in | Wt 152.4 lb

## 2021-09-06 DIAGNOSIS — N186 End stage renal disease: Secondary | ICD-10-CM | POA: Diagnosis not present

## 2021-09-06 DIAGNOSIS — R4689 Other symptoms and signs involving appearance and behavior: Secondary | ICD-10-CM

## 2021-09-06 DIAGNOSIS — G43009 Migraine without aura, not intractable, without status migrainosus: Secondary | ICD-10-CM

## 2021-09-06 DIAGNOSIS — Z992 Dependence on renal dialysis: Secondary | ICD-10-CM | POA: Diagnosis not present

## 2021-09-06 MED ORDER — CITALOPRAM HYDROBROMIDE 40 MG PO TABS: 60.0000 mg | ORAL_TABLET | Freq: Every day | ORAL | 1 refills | Status: DC

## 2021-09-06 MED ORDER — NORTRIPTYLINE HCL 25 MG PO CAPS: 25.0000 mg | ORAL_CAPSULE | Freq: Every day | ORAL | 3 refills | Status: DC

## 2021-09-06 NOTE — Assessment & Plan Note (Signed)
Will get social worker involved and increase his celexa. Consider adding abilify/seroquel if still having issues. Continue to monitor closely. Call with any concerns.
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
## 2021-09-06 NOTE — Assessment & Plan Note (Signed)
Following with nephrology. Will discuss the possibility of getting a fistula so he doesn't have to have the peritoneal tube. We will give nephrology a heads up on this. Continue to monitor.

## 2021-09-06 NOTE — Assessment & Plan Note (Signed)
Will get him started on nortriptyline and recheck 1 month. Call with any concerns. Continue to monitor.

## 2021-09-07 LAB — MICROSCOPIC EXAMINATION
Bacteria, UA: NONE SEEN
Casts: NONE SEEN /lpf
Epithelial Cells (non renal): NONE SEEN /hpf (ref 0–10)
RBC, Urine: NONE SEEN /hpf (ref 0–2)
WBC, UA: NONE SEEN /hpf (ref 0–5)

## 2021-09-07 LAB — URINALYSIS, ROUTINE W REFLEX MICROSCOPIC
Bilirubin, UA: NEGATIVE
Glucose, UA: NEGATIVE
Ketones, UA: NEGATIVE
Leukocytes,UA: NEGATIVE
Nitrite, UA: NEGATIVE
RBC, UA: NEGATIVE
Specific Gravity, UA: 1.01 (ref 1.005–1.030)
Urobilinogen, Ur: 0.2 mg/dL (ref 0.2–1.0)
pH, UA: 7.5 (ref 5.0–7.5)

## 2021-09-08 DIAGNOSIS — Z992 Dependence on renal dialysis: Secondary | ICD-10-CM | POA: Diagnosis not present

## 2021-09-08 DIAGNOSIS — N186 End stage renal disease: Secondary | ICD-10-CM | POA: Diagnosis not present

## 2021-09-09 ENCOUNTER — Ambulatory Visit: Payer: Self-pay | Admitting: Licensed Clinical Social Worker

## 2021-09-09 DIAGNOSIS — N186 End stage renal disease: Secondary | ICD-10-CM | POA: Diagnosis not present

## 2021-09-09 DIAGNOSIS — Z992 Dependence on renal dialysis: Secondary | ICD-10-CM | POA: Diagnosis not present

## 2021-09-09 NOTE — Chronic Care Management (AMB) (Signed)
° °   Clinical Social Work  Care Management   Phone Outreach    09/09/2021 Name: James Arnold MRN: [id number] DOB: [birth year]  James Arnold is a 65 y.o. year old male who is a primary care patient of Lawerance, Matsuo, DO .   Reason for referral: Mental Health Counseling and Resources.    CCM LCSW reached out to patient today by phone to introduce self, assess needs and offer Care Management services and interventions.   CCM LCSW collaborated with Care Guide on obtaining DPR in chart and reviewing referral from PCP  Plan: Patient and spouse reports that they are not interested in Care Management services and interventions. Patient shared medications has assisted with decrease in headaches and he will follow up with PCP in a month to further assess.   Review of patient status, including review of consultants reports, relevant laboratory and other test results, and collaboration with appropriate care team members and the patient's provider was performed as part of comprehensive patient evaluation and provision of care management services.    Christa See, MSW, [email or web address] Phone [phone] 11:27 AM

## 2021-09-10 ENCOUNTER — Ambulatory Visit: Payer: Medicare HMO | Admitting: Gastroenterology

## 2021-09-10 DIAGNOSIS — Z992 Dependence on renal dialysis: Secondary | ICD-10-CM | POA: Diagnosis not present

## 2021-09-10 DIAGNOSIS — N186 End stage renal disease: Secondary | ICD-10-CM | POA: Diagnosis not present

## 2021-09-11 ENCOUNTER — Other Ambulatory Visit: Payer: Self-pay

## 2021-09-11 ENCOUNTER — Encounter: Payer: Self-pay | Admitting: Internal Medicine

## 2021-09-11 ENCOUNTER — Emergency Department: Payer: Medicare HMO

## 2021-09-11 ENCOUNTER — Inpatient Hospital Stay
Admission: EM | Admit: 2021-09-11 | Discharge: 2021-09-13 | DRG: 280 | Disposition: A | Discharge status: Other Acute Inpatient Hospital | Payer: Medicare HMO | Attending: Hospitalist | Admitting: Hospitalist

## 2021-09-11 DIAGNOSIS — Z0389 Encounter for observation for other suspected diseases and conditions ruled out: Secondary | ICD-10-CM | POA: Diagnosis not present

## 2021-09-11 DIAGNOSIS — I2582 Chronic total occlusion of coronary artery: Secondary | ICD-10-CM | POA: Diagnosis present

## 2021-09-11 DIAGNOSIS — N2581 Secondary hyperparathyroidism of renal origin: Secondary | ICD-10-CM | POA: Diagnosis present

## 2021-09-11 DIAGNOSIS — R202 Paresthesia of skin: Secondary | ICD-10-CM | POA: Diagnosis present

## 2021-09-11 DIAGNOSIS — Z823 Family history of stroke: Secondary | ICD-10-CM

## 2021-09-11 DIAGNOSIS — I132 Hypertensive heart and chronic kidney disease with heart failure and with stage 5 chronic kidney disease, or end stage renal disease: Secondary | ICD-10-CM | POA: Diagnosis present

## 2021-09-11 DIAGNOSIS — I129 Hypertensive chronic kidney disease with stage 1 through stage 4 chronic kidney disease, or unspecified chronic kidney disease: Secondary | ICD-10-CM | POA: Diagnosis not present

## 2021-09-11 DIAGNOSIS — D631 Anemia in chronic kidney disease: Secondary | ICD-10-CM | POA: Diagnosis not present

## 2021-09-11 DIAGNOSIS — N400 Enlarged prostate without lower urinary tract symptoms: Secondary | ICD-10-CM | POA: Diagnosis present

## 2021-09-11 DIAGNOSIS — J811 Chronic pulmonary edema: Secondary | ICD-10-CM | POA: Diagnosis not present

## 2021-09-11 DIAGNOSIS — Z7982 Long term (current) use of aspirin: Secondary | ICD-10-CM

## 2021-09-11 DIAGNOSIS — I252 Old myocardial infarction: Secondary | ICD-10-CM | POA: Diagnosis not present

## 2021-09-11 DIAGNOSIS — D471 Chronic myeloproliferative disease: Secondary | ICD-10-CM | POA: Diagnosis not present

## 2021-09-11 DIAGNOSIS — J9620 Acute and chronic respiratory failure, unspecified whether with hypoxia or hypercapnia: Secondary | ICD-10-CM | POA: Diagnosis not present

## 2021-09-11 DIAGNOSIS — M109 Gout, unspecified: Secondary | ICD-10-CM | POA: Diagnosis present

## 2021-09-11 DIAGNOSIS — I700 Atherosclerosis of aorta: Secondary | ICD-10-CM | POA: Diagnosis not present

## 2021-09-11 DIAGNOSIS — Z79899 Other long term (current) drug therapy: Secondary | ICD-10-CM

## 2021-09-11 DIAGNOSIS — I429 Cardiomyopathy, unspecified: Secondary | ICD-10-CM | POA: Diagnosis present

## 2021-09-11 DIAGNOSIS — F17210 Nicotine dependence, cigarettes, uncomplicated: Secondary | ICD-10-CM | POA: Diagnosis present

## 2021-09-11 DIAGNOSIS — I214 Non-ST elevation (NSTEMI) myocardial infarction: Principal | ICD-10-CM | POA: Diagnosis present

## 2021-09-11 DIAGNOSIS — R5382 Chronic fatigue, unspecified: Secondary | ICD-10-CM | POA: Diagnosis present

## 2021-09-11 DIAGNOSIS — R569 Unspecified convulsions: Secondary | ICD-10-CM

## 2021-09-11 DIAGNOSIS — I2510 Atherosclerotic heart disease of native coronary artery without angina pectoris: Secondary | ICD-10-CM | POA: Diagnosis not present

## 2021-09-11 DIAGNOSIS — M7989 Other specified soft tissue disorders: Secondary | ICD-10-CM | POA: Diagnosis present

## 2021-09-11 DIAGNOSIS — Z992 Dependence on renal dialysis: Secondary | ICD-10-CM

## 2021-09-11 DIAGNOSIS — Z955 Presence of coronary angioplasty implant and graft: Secondary | ICD-10-CM | POA: Diagnosis not present

## 2021-09-11 DIAGNOSIS — Z87898 Personal history of other specified conditions: Secondary | ICD-10-CM

## 2021-09-11 DIAGNOSIS — J432 Centrilobular emphysema: Secondary | ICD-10-CM | POA: Diagnosis present

## 2021-09-11 DIAGNOSIS — D696 Thrombocytopenia, unspecified: Secondary | ICD-10-CM | POA: Diagnosis present

## 2021-09-11 DIAGNOSIS — F32A Depression, unspecified: Secondary | ICD-10-CM | POA: Diagnosis present

## 2021-09-11 DIAGNOSIS — Z720 Tobacco use: Secondary | ICD-10-CM | POA: Diagnosis not present

## 2021-09-11 DIAGNOSIS — I25110 Atherosclerotic heart disease of native coronary artery with unstable angina pectoris: Secondary | ICD-10-CM | POA: Diagnosis not present

## 2021-09-11 DIAGNOSIS — Z20822 Contact with and (suspected) exposure to covid-19: Secondary | ICD-10-CM | POA: Diagnosis not present

## 2021-09-11 DIAGNOSIS — N186 End stage renal disease: Secondary | ICD-10-CM | POA: Diagnosis not present

## 2021-09-11 DIAGNOSIS — E785 Hyperlipidemia, unspecified: Secondary | ICD-10-CM | POA: Diagnosis present

## 2021-09-11 DIAGNOSIS — I69398 Other sequelae of cerebral infarction: Secondary | ICD-10-CM

## 2021-09-11 DIAGNOSIS — J449 Chronic obstructive pulmonary disease, unspecified: Secondary | ICD-10-CM

## 2021-09-11 DIAGNOSIS — I5021 Acute systolic (congestive) heart failure: Secondary | ICD-10-CM | POA: Diagnosis not present

## 2021-09-11 DIAGNOSIS — R079 Chest pain, unspecified: Secondary | ICD-10-CM | POA: Diagnosis not present

## 2021-09-11 DIAGNOSIS — Z818 Family history of other mental and behavioral disorders: Secondary | ICD-10-CM

## 2021-09-11 LAB — GLUCOSE, CAPILLARY: Glucose-Capillary: 99 mg/dL (ref 70–99)

## 2021-09-11 LAB — HEPATIC FUNCTION PANEL
ALT: 17 U/L (ref 0–44)
AST: 14 U/L — ABNORMAL LOW (ref 15–41)
Albumin: 3.1 g/dL — ABNORMAL LOW (ref 3.5–5.0)
Alkaline Phosphatase: 45 U/L (ref 38–126)
Bilirubin, Direct: 0.2 mg/dL (ref 0.0–0.2)
Indirect Bilirubin: 0.8 mg/dL (ref 0.3–0.9)
Total Bilirubin: 1 mg/dL (ref 0.3–1.2)
Total Protein: 5.8 g/dL — ABNORMAL LOW (ref 6.5–8.1)

## 2021-09-11 LAB — CBC
HCT: 30.1 % — ABNORMAL LOW (ref 39.0–52.0)
Hemoglobin: 9.5 g/dL — ABNORMAL LOW (ref 13.0–17.0)
MCH: 28.9 pg (ref 26.0–34.0)
MCHC: 31.6 g/dL (ref 30.0–36.0)
MCV: 91.5 fL (ref 80.0–100.0)
Platelets: 125 10*3/uL — ABNORMAL LOW (ref 150–400)
RBC: 3.29 MIL/uL — ABNORMAL LOW (ref 4.22–5.81)
RDW: 15.8 % — ABNORMAL HIGH (ref 11.5–15.5)
WBC: 4.3 10*3/uL (ref 4.0–10.5)
nRBC: 0 % (ref 0.0–0.2)

## 2021-09-11 LAB — HIV ANTIBODY (ROUTINE TESTING W REFLEX): HIV Screen 4th Generation wRfx: NONREACTIVE

## 2021-09-11 LAB — APTT: aPTT: 30 seconds (ref 24–36)

## 2021-09-11 LAB — LIPASE, BLOOD: Lipase: 27 U/L (ref 11–51)

## 2021-09-11 LAB — PROTIME-INR
INR: 1.1 (ref 0.8–1.2)
Prothrombin Time: 14.4 seconds (ref 11.4–15.2)

## 2021-09-11 LAB — TROPONIN I (HIGH SENSITIVITY)
Troponin I (High Sensitivity): 284 ng/L (ref <18)
Troponin I (High Sensitivity): 419 ng/L (ref <18)
Troponin I (High Sensitivity): 495 ng/L (ref <18)
Troponin I (High Sensitivity): 488 ng/L (ref <18)

## 2021-09-11 LAB — BASIC METABOLIC PANEL
Anion gap: 11 (ref 5–15)
BUN: 61 mg/dL — ABNORMAL HIGH (ref 8–23)
CO2: 23 mmol/L (ref 22–32)
Calcium: 7.5 mg/dL — ABNORMAL LOW (ref 8.9–10.3)
Chloride: 104 mmol/L (ref 98–111)
Creatinine, Ser: 7.16 mg/dL — ABNORMAL HIGH (ref 0.61–1.24)
GFR, Estimated: 8 mL/min — ABNORMAL LOW (ref >60)
Glucose, Bld: 88 mg/dL (ref 70–99)
Potassium: 4.2 mmol/L (ref 3.5–5.1)
Sodium: 138 mmol/L (ref 135–145)

## 2021-09-11 LAB — HEPARIN LEVEL (UNFRACTIONATED): Heparin Unfractionated: <0.1 IU/mL — ABNORMAL LOW (ref 0.30–0.70)

## 2021-09-11 LAB — RESP PANEL BY RT-PCR (FLU A&B, COVID) ARPGX2
Influenza A by PCR: NEGATIVE
Influenza B by PCR: NEGATIVE
SARS Coronavirus 2 by RT PCR: NEGATIVE

## 2021-09-11 LAB — D-DIMER, QUANTITATIVE: D-Dimer, Quant: 1.03 ug/mL-FEU — ABNORMAL HIGH (ref 0.00–0.50)

## 2021-09-11 MED ORDER — ASPIRIN 81 MG PO CHEW
81.0000 mg | CHEWABLE_TABLET | Freq: Every day | ORAL | Status: DC
  Administered 2021-09-12 – 2021-09-13 (×2): 81 mg via ORAL
  Filled 2021-09-11 (×2): qty 1

## 2021-09-11 MED ORDER — ASPIRIN 81 MG PO CHEW: 324.0000 mg | CHEWABLE_TABLET | ORAL | Status: DC

## 2021-09-11 MED ORDER — CARVEDILOL 25 MG PO TABS
25.0000 mg | ORAL_TABLET | Freq: Two times a day (BID) | ORAL | Status: DC
  Administered 2021-09-11 – 2021-09-13 (×3): 25 mg via ORAL
  Filled 2021-09-11 (×3): qty 1

## 2021-09-11 MED ORDER — NITROGLYCERIN 0.4 MG SL SUBL
0.4000 mg | SUBLINGUAL_TABLET | SUBLINGUAL | Status: DC | PRN
  Administered 2021-09-12 – 2021-09-13 (×4): 0.4 mg via SUBLINGUAL
  Filled 2021-09-11 (×2): qty 1

## 2021-09-11 MED ORDER — ASPIRIN 81 MG PO CHEW
324.0000 mg | CHEWABLE_TABLET | Freq: Once | ORAL | Status: AC
  Administered 2021-09-11: 12:00:00 324 mg via ORAL
  Filled 2021-09-11: qty 4

## 2021-09-11 MED ORDER — ALBUTEROL SULFATE (2.5 MG/3ML) 0.083% IN NEBU
3.0000 mL | INHALATION_SOLUTION | Freq: Four times a day (QID) | RESPIRATORY_TRACT | Status: DC | PRN
  Administered 2021-09-12: 3 mL via RESPIRATORY_TRACT
  Filled 2021-09-11: qty 3

## 2021-09-11 MED ORDER — NICOTINE 14 MG/24HR TD PT24
14.0000 mg | MEDICATED_PATCH | Freq: Every day | TRANSDERMAL | Status: DC
  Administered 2021-09-12 – 2021-09-13 (×2): 14 mg via TRANSDERMAL
  Filled 2021-09-11 (×2): qty 1

## 2021-09-11 MED ORDER — DELFLEX-LC/1.5% DEXTROSE 344 MOSM/L IP SOLN
INTRAPERITONEAL | Status: DC
  Administered 2021-09-12: 23:00:00 1 mL via INTRAPERITONEAL
  Filled 2021-09-11 (×3): qty 3000

## 2021-09-11 MED ORDER — NITROGLYCERIN 0.4 MG SL SUBL: 0.4000 mg | SUBLINGUAL_TABLET | SUBLINGUAL | Status: DC | PRN

## 2021-09-11 MED ORDER — ONDANSETRON HCL 4 MG/2ML IJ SOLN: 4.0000 mg | Freq: Four times a day (QID) | INTRAMUSCULAR | Status: DC | PRN

## 2021-09-11 MED ORDER — CITALOPRAM HYDROBROMIDE 20 MG PO TABS: 60.0000 mg | ORAL_TABLET | Freq: Every day | ORAL | Status: DC

## 2021-09-11 MED ORDER — HEPARIN 1000 UNIT/ML FOR PERITONEAL DIALYSIS
500.0000 [IU] | INTRAMUSCULAR | Status: DC | PRN
  Filled 2021-09-11: qty 0.5

## 2021-09-11 MED ORDER — HEPARIN (PORCINE) 25000 UT/250ML-% IV SOLN
1200.0000 [IU]/h | INTRAVENOUS | Status: DC
Start: 2021-09-11 — End: 2021-09-12
  Administered 2021-09-11: 12:00:00 800 [IU]/h via INTRAVENOUS
  Filled 2021-09-11 (×2): qty 250

## 2021-09-11 MED ORDER — CITALOPRAM HYDROBROMIDE 20 MG PO TABS
40.0000 mg | ORAL_TABLET | Freq: Every day | ORAL | Status: DC
  Administered 2021-09-12 – 2021-09-13 (×2): 40 mg via ORAL
  Filled 2021-09-11 (×2): qty 2

## 2021-09-11 MED ORDER — ACETAMINOPHEN 325 MG PO TABS: 650.0000 mg | ORAL_TABLET | ORAL | Status: DC | PRN

## 2021-09-11 MED ORDER — LEVETIRACETAM 500 MG PO TABS
500.0000 mg | ORAL_TABLET | Freq: Every day | ORAL | Status: DC
  Administered 2021-09-11 – 2021-09-12 (×2): 500 mg via ORAL
  Filled 2021-09-11 (×2): qty 1

## 2021-09-11 MED ORDER — NORTRIPTYLINE HCL 25 MG PO CAPS
25.0000 mg | ORAL_CAPSULE | Freq: Every day | ORAL | Status: DC
  Administered 2021-09-11 – 2021-09-12 (×2): 25 mg via ORAL
  Filled 2021-09-11 (×3): qty 1

## 2021-09-11 MED ORDER — POLYETHYLENE GLYCOL 3350 17 G PO PACK: 17.0000 g | PACK | Freq: Every day | ORAL | Status: DC | PRN

## 2021-09-11 MED ORDER — ASPIRIN 300 MG RE SUPP: 300.0000 mg | RECTAL | Status: DC

## 2021-09-11 MED ORDER — FERROUS SULFATE 325 (65 FE) MG PO TABS
325.0000 mg | ORAL_TABLET | Freq: Every day | ORAL | Status: DC
  Administered 2021-09-12 – 2021-09-13 (×2): 325 mg via ORAL
  Filled 2021-09-11 (×2): qty 1

## 2021-09-11 MED ORDER — HEPARIN BOLUS VIA INFUSION
4000.0000 [IU] | Freq: Once | INTRAVENOUS | Status: AC
  Administered 2021-09-11: 12:00:00 4000 [IU] via INTRAVENOUS
  Filled 2021-09-11: qty 4000

## 2021-09-11 MED ORDER — FUROSEMIDE 20 MG PO TABS
20.0000 mg | ORAL_TABLET | Freq: Every day | ORAL | Status: DC
  Administered 2021-09-12 – 2021-09-13 (×2): 20 mg via ORAL
  Filled 2021-09-11 (×2): qty 1

## 2021-09-11 MED ORDER — VITAMIN D 25 MCG (1000 UNIT) PO TABS
1000.0000 [IU] | ORAL_TABLET | Freq: Every day | ORAL | Status: DC
Start: 2021-09-12 — End: 2021-09-13
  Administered 2021-09-12 – 2021-09-13 (×2): 1000 [IU] via ORAL
  Filled 2021-09-11 (×2): qty 1

## 2021-09-11 MED ORDER — TAMSULOSIN HCL 0.4 MG PO CAPS
0.4000 mg | ORAL_CAPSULE | Freq: Every day | ORAL | Status: DC
  Administered 2021-09-12 – 2021-09-13 (×2): 0.4 mg via ORAL
  Filled 2021-09-11 (×2): qty 1

## 2021-09-11 MED ORDER — FENTANYL CITRATE PF 50 MCG/ML IJ SOSY
25.0000 ug | PREFILLED_SYRINGE | Freq: Once | INTRAMUSCULAR | Status: AC
  Administered 2021-09-11: 12:00:00 25 ug via INTRAVENOUS
  Filled 2021-09-11: qty 1

## 2021-09-11 MED ORDER — GENTAMICIN SULFATE 0.1 % EX CREA
1.0000 "application " | TOPICAL_CREAM | Freq: Every day | CUTANEOUS | Status: DC
  Administered 2021-09-12: 1 via TOPICAL
  Filled 2021-09-11 (×2): qty 15

## 2021-09-11 MED ORDER — HEPARIN BOLUS VIA INFUSION
2000.0000 [IU] | Freq: Once | INTRAVENOUS | Status: AC
  Administered 2021-09-11: 21:00:00 2000 [IU] via INTRAVENOUS
  Filled 2021-09-11: qty 2000

## 2021-09-11 MED ORDER — ATORVASTATIN CALCIUM 20 MG PO TABS
40.0000 mg | ORAL_TABLET | Freq: Every day | ORAL | Status: DC
  Administered 2021-09-13: 09:00:00 40 mg via ORAL
  Filled 2021-09-11 (×2): qty 2

## 2021-09-11 MED ORDER — PANTOPRAZOLE SODIUM 40 MG PO TBEC: 40.0000 mg | DELAYED_RELEASE_TABLET | Freq: Every day | ORAL | Status: DC

## 2021-09-11 NOTE — ED Triage Notes (Signed)
Pt here with CP and SOB that started at 0400 today. Pt states that pain is right sided and does not radiate to any extremities but down his right rrunk. Pt in NAD in triage but does have a hx of a MI in the past.

## 2021-09-11 NOTE — Progress Notes (Signed)
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  Patient presents to the emergency department with complaints of chest pain and discomfort.  Patient will be admitted for Leg swelling [M79.89] NSTEMI (non-ST elevated myocardial infarction) [facility]) [I21.4]  Patient is known to our clinic and receives peritoneal dialysis supervision at [facility] with Dr. Holley Raring.  He received a full peritoneal treatment last night.  Patient states he woke early this morning to use the bathroom and became short of breath when returning to the bed.  He then developed midsternal chest pain he describes as needles in his chest.  Denies radiation of this pain.  Denies associating symptoms such as nausea, headache, jaw pain.  Denies fever and cough.  Pertinent labs include elevated troponins and elevated D-dimer.  Lower extremity ultrasound negative for DVT.  Chest x-ray negative for acute changes  We have been consulted to manage PD treatments during this admission.   Objective:  Vital signs in last 24 hours:  Temp:  [97.4 F (36.3 C)] 97.4 F (36.3 C) (12/21 0820) Pulse Rate:  [79-96] 81 (12/21 1630) Resp:  [16-26] 16 (12/21 1630) BP: (120-145)/(60-80) 130/67 (12/21 1630) SpO2:  [92 %-98 %] 97 % (12/21 1630) Weight:  [66.7 kg] 66.7 kg (12/21 0818)  Weight change:  Filed Weights   09/11/21 0818  Weight: 66.7 kg    Intake/Output: No intake/output data recorded.   Intake/Output this shift:  No intake/output data recorded.  Physical Exam: General: NAD, sitting up in bed  Head: Normocephalic, atraumatic. Moist oral mucosal membranes  Eyes: Anicteric  Neck: Supple  Lungs:  Clear to auscultation, normal effort  Heart: Regular rate and rhythm  Abdomen:  Soft, nontender,   Extremities:  No  peripheral edema.  Neurologic: Nonfocal, moving all four extremities  Skin: No lesions  Access: PD catheter    Basic Metabolic Panel: Recent Labs  Lab 09/11/21 0818  NA 138  K 4.2  CL 104  CO2 23  GLUCOSE 88  BUN 61*  CREATININE 7.16*  CALCIUM 7.5*    Liver Function Tests: Recent Labs  Lab 09/11/21 0818  AST 14*  ALT 17  ALKPHOS 45  BILITOT 1.0  PROT 5.8*  ALBUMIN 3.1*   Recent Labs  Lab 09/11/21 0818  LIPASE 27   No results for input(s): AMMONIA in the last 168 hours.  CBC: Recent Labs  Lab 09/11/21 0818  WBC 4.3  HGB 9.5*  HCT 30.1*  MCV 91.5  PLT 125*    Cardiac Enzymes: No results for input(s): CKTOTAL, CKMB, CKMBINDEX, TROPONINI in the last 168 hours.  BNP: Invalid input(s): POCBNP  CBG: No results for input(s): GLUCAP in the last 168 hours.  Microbiology: Results for orders placed or performed during the hospital encounter of 09/11/21  Resp Panel by RT-PCR (Flu A&B, Covid) Nasopharyngeal Swab     Status: None   Collection Time: 09/11/21 10:53 AM   Specimen: Nasopharyngeal Swab; Nasopharyngeal(NP) swabs in vial transport medium  Result Value Ref Range Status   SARS Coronavirus 2 by RT PCR NEGATIVE NEGATIVE Final    Comment: (NOTE) SARS-CoV-2 target nucleic acids are NOT DETECTED.  The SARS-CoV-2 RNA is generally detectable in upper respiratory specimens during the acute phase of infection. The lowest concentration of SARS-CoV-2 viral copies this assay can detect is 138 copies/mL. A negative  result does not preclude SARS-Cov-2 infection and should not be used as the sole basis for treatment or other patient management decisions. A negative result may occur with  improper specimen collection/handling, submission of specimen other than nasopharyngeal swab, presence of viral mutation(s) within the areas targeted by this assay, and inadequate number of viral copies(<138 copies/mL). A negative result must be combined with clinical  observations, patient history, and epidemiological information. The expected result is Negative.  Fact Sheet for Patients:  EntrepreneurPulse.com.au  Fact Sheet for Healthcare Providers:  IncredibleEmployment.be  This test is no t yet approved or cleared by the [place] FDA and  has been authorized for detection and/or diagnosis of SARS-CoV-2 by FDA under an Emergency Use Authorization (EUA). This EUA will remain  in effect (meaning this test can be used) for the duration of the COVID-19 declaration under Section 564(b)(1) of the Act, 21 U.S.C.section 360bbb-3(b)(1), unless the authorization is terminated  or revoked sooner.       Influenza A by PCR NEGATIVE NEGATIVE Final   Influenza B by PCR NEGATIVE NEGATIVE Final    Comment: (NOTE) The Xpert Xpress SARS-CoV-2/FLU/RSV plus assay is intended as an aid in the diagnosis of influenza from Nasopharyngeal swab specimens and should not be used as a sole basis for treatment. Nasal washings and aspirates are unacceptable for Xpert Xpress SARS-CoV-2/FLU/RSV testing.  Fact Sheet for Patients: EntrepreneurPulse.com.au  Fact Sheet for Healthcare Providers: IncredibleEmployment.be  This test is not yet approved or cleared by the [place] FDA and has been authorized for detection and/or diagnosis of SARS-CoV-2 by FDA under an Emergency Use Authorization (EUA). This EUA will remain in effect (meaning this test can be used) for the duration of the COVID-19 declaration under Section 564(b)(1) of the Act, 21 U.S.C. section 360bbb-3(b)(1), unless the authorization is terminated or revoked.  Performed at [facility], [address] [place], [place] [address]     Coagulation Studies: Recent Labs    09/11/21 0818  LABPROT 14.4  INR 1.1    Urinalysis: No results for input(s): COLORURINE, LABSPEC, PHURINE, GLUCOSEU, HGBUR, BILIRUBINUR,  KETONESUR, PROTEINUR, UROBILINOGEN, NITRITE, LEUKOCYTESUR in the last 72 hours.  Invalid input(s): APPERANCEUR    Imaging: DG Chest 2 View  Result Date: 09/11/2021 CLINICAL DATA:  Chest pain 02/27/2021 EXAM: CHEST - 2 VIEW COMPARISON:  02/27/2021 FINDINGS: Mild hyperinflation. Scarring in the lung bases. No acute confluent opacities. Heart is normal size. No effusions or acute bony abnormality. IMPRESSION: Mild hyperinflation/chronic changes.  No active disease. Electronically Signed   By: Rolm Baptise M.D.   On: 09/11/2021 08:40   US Venous Img Lower Bilateral (DVT)  Result Date: 09/11/2021 CLINICAL DATA:  Unable to obtain CT.  Rule out DVT. EXAM: BILATERAL LOWER EXTREMITY VENOUS DOPPLER ULTRASOUND TECHNIQUE: Gray-scale sonography with graded compression, as well as color Doppler and duplex ultrasound were performed to evaluate the lower extremity deep venous systems from the level of the common femoral vein and including the common femoral, femoral, profunda femoral, popliteal and calf veins including the posterior tibial, peroneal and gastrocnemius veins when visible. The superficial great saphenous vein was also interrogated. Spectral Doppler was utilized to evaluate flow at rest and with distal augmentation maneuvers in the common femoral, femoral and popliteal veins. COMPARISON:  Chest XR, 09/11/2021. FINDINGS: RIGHT LOWER EXTREMITY VENOUS Normal compressibility of the RIGHT common femoral, superficial femoral, and popliteal veins, as well as the visualized calf veins. Visualized portions of profunda femoral vein and great saphenous vein unremarkable. No  filling defects to suggest DVT on grayscale or color Doppler imaging. Doppler waveforms show normal direction of venous flow, normal respiratory plasticity and response to augmentation. OTHER No evidence of superficial thrombophlebitis or abnormal fluid collection. Limitations: none LEFT LOWER EXTREMITY VENOUS Normal compressibility of the LEFT  common femoral, superficial femoral, and popliteal veins, as well as the visualized calf veins. Visualized portions of profunda femoral vein and great saphenous vein unremarkable. No filling defects to suggest DVT on grayscale or color Doppler imaging. Doppler waveforms show normal direction of venous flow, normal respiratory plasticity and response to augmentation. OTHER None. Limitations: none IMPRESSION: No evidence of femoropopliteal DVT within either lower extremity. Michaelle Birks, MD [facility] Electronically Signed   By: Michaelle Birks M.D.   On: 09/11/2021 12:05     Medications:    dialysis solution 1.5% low-MG/low-CA     heparin 800 Units/hr (09/11/21 1212)    [START ON 09/12/2021] aspirin  81 mg Oral Daily   atorvastatin  40 mg Oral Daily   carvedilol  25 mg Oral BID WC   [START ON 09/12/2021] cholecalciferol  1,000 Units Oral Daily   [START ON 09/12/2021] citalopram  40 mg Oral Daily   [START ON 09/12/2021] ferrous sulfate  325 mg Oral Q breakfast   [START ON 09/12/2021] furosemide  20 mg Oral Daily   gentamicin cream  1 application Topical Daily   levETIRAcetam  500 mg Oral QHS   nicotine  14 mg Transdermal Daily   nortriptyline  25 mg Oral QHS   [START ON 09/12/2021] pantoprazole  40 mg Oral Daily   [START ON 09/12/2021] tamsulosin  0.4 mg Oral Daily   acetaminophen, albuterol, heparin, nitroGLYCERIN, ondansetron (ZOFRAN) IV, polyethylene glycol  Assessment/ Plan:  James Arnold is a 65 y.o.  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  Patient presents to the emergency department with complaints of chest pain and discomfort.  Patient will be admitted for Leg swelling [M79.89] NSTEMI (non-ST elevated myocardial infarction) ([place]) [I21.4]  [facility] -PD 2230ml fills/5 cycles/no last  fill/1020  End-stage renal disease on peritoneal dialysis.  Will maintain nightly treatments during this admission if possible.  Plan to complete treatment tonight.  2. Anemia of chronic kidney disease Lab Results  Component Value Date   HGB 9.5 (L) 09/11/2021    Hemoglobin remains within range.  We will continue to monitor  3. Secondary Hyperparathyroidism:   Lab Results  Component Value Date   CALCIUM 7.5 (L) 09/11/2021   CAION 1.02 (L) 11/19/2020   PHOS 7.8 (H) 11/22/2020    Phosphorus remains elevated.     LOS: 0 Cobe Viney 12/21/20225:11 PM

## 2021-09-11 NOTE — H&P (View-Only) (Signed)
Cardiology Consult    [name] MRN: [id number], DOB/AGE: [birth year]   Admit date: 09/11/2021 Date of Consult: 09/11/2021  Primary Physician: Valerie Roys, DO Primary Cardiologist: Nelva Bush, MD Requesting Provider: Milon Dikes, MD  Patient Profile    James Arnold is a 65 y.o. 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 Francine Graven.  Past Medical History   Past Medical History:  Diagnosis Date   Anemia    Benign hypertensive renal disease    CAD (coronary artery disease)    a. 08/1996 s/p BMS to the mLAD ([facility]); b. 12/2017 MV: EF 46%, fixed apical defect w/ significant GI uptake artifact. No ischemia-> low risk; c. 07/2020 MV: EF 51%, no ischemia/infarct.   Carotid arterial disease ([place])    a. 11/2020 Carotid U/S: <50% bilat ICA stenoses.   CKD (chronic kidney disease), stage V ([place])    on peritoneal dialysis   Complication of anesthesia    please call by "[name]" when waking up!!   COPD (chronic obstructive pulmonary disease) (HCC)    centrilobular emphysema. pt is poorly controlled with his copd/smoking.   Depression    Diastolic dysfunction    a. 12/2017 Echo: EF 60-65%, no rwma, Gr1 DD, mild AI/MR. Nl RVSP; b. 08/2020 Echo: EF 60-65%, no rwma, GrI DD, nl RV fxn. RVSP 32.24mmHg.   Dyspnea on exertion    with exertion   Fatigue    FSGS (focal segmental glomerulosclerosis)    Hyperlipidemia 10/2002   Hypertension    myelofibrosis    bone marrow failure   Myelofibrosis ([place]) 2010   JAK2 (+) primary   Myocardial infarction ([place]) 1997   stent x 1   Pneumoperitoneum 02/2021   PSVT (paroxysmal supraventricular tachycardia) ([place])    a. 12/2020 Zio: Sinus rhythm 80 (55-117). Rare PACs/PVCs. 17  episodes of SVT (longest 20.3 secs, fastest 184). No triggered events. No afib.   Smoker    Stroke [facility])    a. 11/2020 MRI Brain: patchy acute/early subacute cortical infarcts w/in the R frontal, parietal, and occiptal lobes. Small, chronic L basal ganglia lacunar infarct.   Thrombocytopenia [facility])     Past Surgical History:  Procedure Laterality Date   ANGIOPLASTY     BONE MARROW ASPIRATION  03/2009   CARDIAC CATHETERIZATION  1997   stent x 1   COLONOSCOPY WITH PROPOFOL N/A 08/09/2018   Procedure: COLONOSCOPY WITH PROPOFOL;  Surgeon: [name], MD;  Location: [facility] ENDOSCOPY;  Service: Gastroenterology;  Laterality: N/A;   CORONARY STENT PLACEMENT  08/1996   ESOPHAGOGASTRODUODENOSCOPY (EGD) WITH PROPOFOL N/A 07/25/2021   Procedure: ESOPHAGOGASTRODUODENOSCOPY (EGD) WITH PROPOFOL;  Surgeon: [name], MD;  Location: [facility] ENDOSCOPY;  Service: Gastroenterology;  Laterality: N/A;   EXTERIORIZATION OF A CONTINUOUS AMBULATORY PERITONEAL DIALYSIS CATHETER     EYE SURGERY Bilateral    cats removed   REVERSE SHOULDER ARTHROPLASTY Left 11/19/2020   Procedure: Left reverse shoulder arthroplasty;  Surgeon: Leim Fabry, MD;  Location: [facility] ORS;  Service: Orthopedics;  Laterality: Left;     Allergies  No Known Allergies  History of Present Illness    65 year old male with a history of CAD, hypertension, hyperlipidemia, diastolic dysfunction, tobacco abuse, primary myelofibrosis, 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  Cardiac history is notable for bare-metal stenting of the mid LAD in 1997 at [facility].  He was hospitalized in 2019 with chest pain and underwent echocardiogram which showed normal LV function.  Stress testing was performed and was low risk.  November 2021, he was seen in the emergency department with recurrent chest pain mild troponin elevation to 36.  Stress testing August 21, 2020 was low risk without evidence of  ischemia or infarct.  Echocardiogram in December 2021 showed an EF of 60 to 65%.  In late February of this year, he underwent left rotator cuff arthroplasty with postoperative development of seizure and stroke.  Head CT showed remote right frontal and parietal cortical infarcts while MRI of the brain showed acute/early subacute cortical infarcts within the right frontal, parietal, and occipital lobes.  Echo showed an EF of 50 to 55% with grade 1 diastolic dysfunction.  Bubble study was negative.  Carotid arterial duplex showed less than 50% bilateral internal carotid artery stenoses.  Recommendation was made for TEE and placement of an implantable loop recorder however patient declined both at outpatient follow-up in March.  A Zio monitor was placed and did not show any evidence of atrial fibrillation.  He did have 17 brief episodes of SVT.  There were no triggered events.   Patient was last seen in cardiology clinic in September, at which time he noted ongoing tobacco abuse and chronic, stable dyspnea on exertion.  He notes that since then, he has had persistent dyspnea on exertion and chronic fatigue.  He still smokes about half a pack of cigarettes per day.  He was in his usual state of health until approximately 3 AM this morning, when he awoke to use the bathroom.  He noted dyspnea while walking to the bathroom and upon returning to bed, dyspnea became worse with a throbbing sensation in his chest.  He denies palpitations.  He had his son get his inhaler however, this did not improve symptoms and his wife brought him into the [facility] ED.  Here,his ECG showed sinus rhythm, 86, LVH with repolarization abnormalities.  Initial troponin was elevated at 284 with subsequent troponins of 419  495.  If stable, normocytic anemia.  D-dimer was mildly elevated at 1.03.  Chest x-ray shows chronic changes without acute disease.  A lower extremity ultrasound was performed and shows no evidence of DVT.  He was placed on  heparin and is currently resting comfortably.  Inpatient Medications     [START ON 09/12/2021] aspirin  81 mg Oral Daily   atorvastatin  40 mg Oral Daily   carvedilol  25 mg Oral BID WC   [START ON 09/12/2021] cholecalciferol  1,000 Units Oral Daily   [START ON 09/12/2021] citalopram  60 mg Oral Daily   [START ON 09/12/2021] ferrous sulfate  325 mg Oral Q breakfast   [START ON 09/12/2021] furosemide  20 mg Oral Daily   levETIRAcetam  500 mg Oral QHS   nicotine  14 mg Transdermal Daily   nortriptyline  25 mg Oral QHS   [START ON 09/12/2021] pantoprazole  40 mg Oral Daily   [START ON 09/12/2021] tamsulosin  0.4 mg Oral Daily    Family History    Family History  Problem Relation Age of Onset   Stroke Mother        Low BP stroke   Depression Father    Depression Sister        Breast   Heart disease Other    Breast cancer  Other    Lung cancer Other    Ovarian cancer Other    Stomach cancer Other    He indicated that the status of his mother is unknown. He indicated that the status of his father is unknown. He indicated that the status of his sister is unknown.   Social History    Social History   Socioeconomic History   Marital status: Married    Spouse name: [name]   Number of children: Not on file   Years of education: Not on file   Highest education level: Not on file  Occupational History   Occupation: dt myelofibrosis    Comment: disabled since 2010  Tobacco Use   Smoking status: Every Day    Packs/day: 0.50    Years: 47.00    Pack years: 23.50    Types: Cigarettes   Smokeless tobacco: Never   Tobacco comments:    trying to cut back-smokes 6 to 7 cigs per day  Vaping Use   Vaping Use: Never used  Substance and Sexual Activity   Alcohol use: Not Currently    Comment: "6 beers weekly on Saturdays"   Drug use: No   Sexual activity: Yes  Other Topics Concern   Not on file  Social History Narrative   Patient lives with wife in his home.   Not working dt  disability.    Has been placed on kidney transplant evaluation list, but does not qualify d/t smoking and myelofibrosis   Social Determinants of Health   Financial Resource Strain: Low Risk    Difficulty of Paying Living Expenses: Not hard at all  Food Insecurity: No Food Insecurity   Worried About Charity fundraiser in the Last Year: Never true   Ran Out of Food in the Last Year: Never true  Transportation Needs: No Transportation Needs   Lack of Transportation (Medical): No   Lack of Transportation (Non-Medical): No  Physical Activity: Inactive   Days of Exercise per Week: 0 days   Minutes of Exercise per Session: 0 min  Stress: No Stress Concern Present   Feeling of Stress : Not at all  Social Connections: Not on file  Intimate Partner Violence: Not on file     Review of Systems    General:  No chills, fever, night sweats or weight changes.  Cardiovascular:  +++ chest throbbing sensation, +++ dyspnea, no edema, orthopnea, palpitations, paroxysmal nocturnal dyspnea. Dermatological: No rash, lesions/masses Respiratory: No cough, +++ dyspnea Urologic: No hematuria, dysuria Abdominal:   No nausea, vomiting, diarrhea, bright red blood per rectum, melena, or hematemesis Neurologic:  No visual changes, wkns, changes in mental status. All other systems reviewed and are otherwise negative except as noted above.  Physical Exam    Blood pressure 120/60, pulse 83, temperature (!) 97.4 F (36.3 C), temperature source Oral, resp. rate (!) 23, height 5\' 7"  (1.702 m), weight 66.7 kg, SpO2 96 %.  General: Pleasant, NAD Psych: Normal affect. Neuro: Alert and oriented X 3. Moves all extremities spontaneously. HEENT: Normal  Neck: Supple without bruits or JVD. Lungs:  Resp regular and unlabored, scattered rhonchi. Heart: RRR no s3, s4, 1/6 systolic murmur at the upper sternal borders. Abdomen: Soft, non-tender, non-distended, BS + x 4.  Extremities: No clubbing, cyanosis or edema.  DP/PT2+, Radials 2+ and equal bilaterally.  Labs    Cardiac Enzymes Recent Labs  Lab 09/11/21 0818 09/11/21 1025 09/11/21 1430  TROPONINIHS 284* 419* 495*      Lab Results  Component Value Date   WBC 4.3 09/11/2021   HGB 9.5 (L) 09/11/2021   HCT 30.1 (L) 09/11/2021   MCV 91.5 09/11/2021   PLT 125 (L) 09/11/2021    Recent Labs  Lab 09/11/21 0818  NA 138  K 4.2  CL 104  CO2 23  BUN 61*  CREATININE 7.16*  CALCIUM 7.5*  PROT 5.8*  BILITOT 1.0  ALKPHOS 45  ALT 17  AST 14*  GLUCOSE 88   Lab Results  Component Value Date   CHOL 160 07/10/2021   HDL 28 (L) 07/10/2021   LDLCALC 110 (H) 07/10/2021   TRIG 120 07/10/2021   Lab Results  Component Value Date   DDIMER 1.03 (H) 09/11/2021     Radiology Studies    DG Chest 2 View  Result Date: 09/11/2021 CLINICAL DATA:  Chest pain 02/27/2021 EXAM: CHEST - 2 VIEW COMPARISON:  02/27/2021 FINDINGS: Mild hyperinflation. Scarring in the lung bases. No acute confluent opacities. Heart is normal size. No effusions or acute bony abnormality. IMPRESSION: Mild hyperinflation/chronic changes.  No active disease. Electronically Signed   By: Rolm Baptise M.D.   On: 09/11/2021 08:40   US Venous Img Lower Bilateral (DVT)  Result Date: 09/11/2021 CLINICAL DATA:  Unable to obtain CT.  Rule out DVT. EXAM: BILATERAL LOWER EXTREMITY VENOUS DOPPLER ULTRASOUND TECHNIQUE: Gray-scale sonography with graded compression, as well as color Doppler and duplex ultrasound were performed to evaluate the lower extremity deep venous systems from the level of the common femoral vein and including the common femoral, femoral, profunda femoral, popliteal and calf veins including the posterior tibial, peroneal and gastrocnemius veins when visible. The superficial great saphenous vein was also interrogated. Spectral Doppler was utilized to evaluate flow at rest and with distal augmentation maneuvers in the common femoral, femoral and popliteal veins. COMPARISON:   Chest XR, 09/11/2021. FINDINGS: RIGHT LOWER EXTREMITY VENOUS Normal compressibility of the RIGHT common femoral, superficial femoral, and popliteal veins, as well as the visualized calf veins. Visualized portions of profunda femoral vein and great saphenous vein unremarkable. No filling defects to suggest DVT on grayscale or color Doppler imaging. Doppler waveforms show normal direction of venous flow, normal respiratory plasticity and response to augmentation. OTHER No evidence of superficial thrombophlebitis or abnormal fluid collection. Limitations: none LEFT LOWER EXTREMITY VENOUS Normal compressibility of the LEFT common femoral, superficial femoral, and popliteal veins, as well as the visualized calf veins. Visualized portions of profunda femoral vein and great saphenous vein unremarkable. No filling defects to suggest DVT on grayscale or color Doppler imaging. Doppler waveforms show normal direction of venous flow, normal respiratory plasticity and response to augmentation. OTHER None. Limitations: none IMPRESSION: No evidence of femoropopliteal DVT within either lower extremity. Michaelle Birks, MD [facility] Electronically Signed   By: Michaelle Birks M.D.   On: 09/11/2021 12:05    ECG & Cardiac Imaging    Regular sinus rhythm, 86, LVH, repolarization abnormality- personally reviewed.  Assessment & Plan    1.  Non-STEMI/CAD: Status post bare-metal stenting of the LAD in 1997.  Low risk stress test in November 2011.  He has chronic, stable dyspnea on exertion but developed worsening dyspnea overnight associated with a throbbing sensation in his chest.  His wife drove him to the emergency department.  ECG is notable for LVH with repolarization abnormalities which may be slightly more pronounced than on prior ECGs.  High-sensitivity troponin is elevated at 284  419  495.  Heparin has  been started.  He currently denies chest pain or dyspnea.  Agree  with aspirin, beta-blocker, and statin therapy (previously has refused statins).  We will keep n.p.o. after midnight as patient ideally would undergo diagnostic catheterization but in the setting of end-stage renal disease on peritoneal dialysis, would like nephrology to weigh in first.  2.  Essential hypertension: Stable.  Continue beta-blocker therapy.  3.  Hyperlipidemia: LDL of 117 in April 2022.  He has previously refused statins.  Atorvastatin has been ordered here.  4.  End-stage renal disease: On peritoneal dialysis.  He does make some urine and takes oral Lasix at home.  Recommend nephrology consultation in the setting of plan for contrast.  5.  Tobacco abuse: Still smoking half a pack a day.  Cessation advised.  6.  Myelofibrosis: With progressive splenomegaly.  Followed by hematology.  7.  History of stroke: Residual left-sided paresthesias without weakness.  Previously offered TEE and implantable loop recorder however he declined.  8.  Normocytic anemia: Relatively stable with hemoglobin/hematocrit of 9.5/30.1.  Signed, Murray Hodgkins, NP 09/11/2021, 3:28 PM  For questions or updates, please contact   Please consult [email or web address] for contact info under Cardiology/STEMI.

## 2021-09-11 NOTE — H&P (Signed)
History and Physical    James Arnold:[id number] DOB: Apr 29, 1956 DOA: 09/11/2021  PCP: Valerie Roys, DO   Patient coming from: Home  I have personally briefly reviewed patient's old medical records in [place]  Chief Complaint: Shortness of breath  HPI: REGAN LLORENTE is a 65 y.o. male with medical history significant for end-stage renal disease on peritoneal dialysis, nicotine dependence, coronary artery disease status post PCI, depression, COPD who presents to the ER for evaluation of sudden onset shortness of breath. Patient states that he woke up to use the bathroom and while walking back to his bed he became acutely short of breath.  He tried to rest with no relief of his symptoms and then he developed midsternal chest pain associated with palpitations and an episode of nausea. He denies having any emesis, no dizziness, no lightheadedness, no abdominal pain, no changes in bowel habits, no fever, no cough, no chills, no leg swelling, no headache, no blurred vision no focal deficit. Sodium 138, potassium 4.2, chloride 104, bicarb 23, glucose 88, BUN 61, creatinine 7.16, calcium 7.5, alkaline phosphatase 45, albumin 3.9, lipase 27, AST 14, ALT 17, total protein 5.8, troponin 284 >> 419, white count 4.3, hemoglobin 9.5, hematocrit 30.1, MCV 91.5, RDW 15.8, platelet count 125, PT 14.4, INR 1.1, Respiratory viral panel is negative Twelve-lead EKG shows sinus rhythm with LVH  ED Course: Patient is a 65 year old male with a history of coronary artery disease who presents to the ER for evaluation of sudden onset shortness of breath associated with chest pain.  His symptoms have resolved but he has uptrending troponin levels concerning for possible non-ST elevation MI. He will be admitted to the hospital for further evaluation.  Review of Systems: As per HPI otherwise all other systems reviewed and negative.    Past Medical History:  Diagnosis Date   Anemia    Benign  hypertensive renal disease    CAD (coronary artery disease)    a. 08/1996 s/p BMS to the mLAD ([facility]); b. 12/2017 MV: EF 46%, fixed apical defect w/ significant GI uptake artifact. No ischemia-> low risk; c. 07/2020 MV: EF 51%, no ischemia/infarct.   Carotid arterial disease ([place])    a. 11/2020 Carotid U/S: <50% bilat ICA stenoses.   CKD (chronic kidney disease), stage V ([place])    on peritoneal dialysis   Complication of anesthesia    please call by "[name]" when waking up!!   COPD (chronic obstructive pulmonary disease) (HCC)    centrilobular emphysema. pt is poorly controlled with his copd/smoking.   Depression    Diastolic dysfunction    a. 12/2017 Echo: EF 60-65%, no rwma, Gr1 DD, mild AI/MR. Nl RVSP; b. 08/2020 Echo: EF 60-65%, no rwma, GrI DD, nl RV fxn. RVSP 32.74mmHg.   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 12/2020 Zio: Sinus rhythm 80 (55-117). Rare PACs/PVCs. 17 episodes of SVT (longest 20.3 secs, fastest 184). No triggered events. No afib.   Smoker    Stroke [facility])    a. 11/2020 MRI Brain: patchy acute/early subacute cortical infarcts w/in the R frontal, parietal, and occiptal  lobes. Small, chronic L basal ganglia lacunar infarct.   Thrombocytopenia [facility])     Past Surgical History:  Procedure Laterality Date   ANGIOPLASTY     BONE MARROW ASPIRATION  03/2009   CARDIAC CATHETERIZATION  1997   stent x 1   COLONOSCOPY WITH PROPOFOL N/A 08/09/2018   Procedure: COLONOSCOPY WITH PROPOFOL;  Surgeon: [name], MD;  Location: [facility] ENDOSCOPY;  Service: Gastroenterology;  Laterality: N/A;   CORONARY STENT PLACEMENT  08/1996   ESOPHAGOGASTRODUODENOSCOPY (EGD) WITH  PROPOFOL N/A 07/25/2021   Procedure: ESOPHAGOGASTRODUODENOSCOPY (EGD) WITH PROPOFOL;  Surgeon: [name], MD;  Location: [facility] ENDOSCOPY;  Service: Gastroenterology;  Laterality: N/A;   EXTERIORIZATION OF A CONTINUOUS AMBULATORY PERITONEAL DIALYSIS CATHETER     EYE SURGERY Bilateral    cats removed   REVERSE SHOULDER ARTHROPLASTY Left 11/19/2020   Procedure: Left reverse shoulder arthroplasty;  Surgeon: Leim Fabry, MD;  Location: [facility] ORS;  Service: Orthopedics;  Laterality: Left;     reports that he has been smoking cigarettes. He has a 23.50 pack-year smoking history. He has never used smokeless tobacco. He reports that he does not currently use alcohol. He reports that he does not use drugs.  No Known Allergies  Family History  Problem Relation Age of Onset   Stroke Mother        Low BP stroke   Depression Father    Depression Sister        Breast   Heart disease Other    Breast cancer Other    Lung cancer Other    Ovarian cancer Other    Stomach cancer Other       Prior to Admission medications   Medication Sig Start Date End Date Taking? Authorizing Provider  albuterol (VENTOLIN HFA) 108 (90 Base) MCG/ACT inhaler Inhale 2 puffs into the lungs every 6 (six) hours as needed for wheezing or shortness of breath. 02/27/21   Borders, Kirt Boys, NP  aspirin 81 MG chewable tablet Chew 1 tablet (81 mg total) by mouth daily. 01/08/21   Johnson, Megan P, DO  carvedilol (COREG) 25 MG tablet Take 1 tablet (25 mg total) by mouth 2 (two) times daily. 06/13/21 09/11/21  Theora Gianotti, NP  Cholecalciferol (VITAMIN D-1000 MAX ST) 25 MCG (1000 UT) tablet Take 1,000 Units by mouth daily.     [provider]  citalopram (CELEXA) 40 MG tablet Take 1.5 tablets (60 mg total) by mouth daily. 09/06/21   Park Liter P, DO  ferrous sulfate 325 (65 FE) MG tablet Take 1 tablet (325 mg total) by mouth daily with breakfast. 12/30/17 08/06/23  Saundra Shelling, MD  furosemide (LASIX) 20 MG  tablet Take 1 tablet (20 mg total) by mouth daily. 01/08/21   Park Liter P, DO  gentamicin cream (GARAMYCIN) 0.1 % Apply 1 application topically every other day. 03/18/19   [provider]  levETIRAcetam (KEPPRA) 500 MG tablet Take 1 tablet (500 mg total) by mouth at bedtime. 11/24/20   Loletha Grayer, MD  nortriptyline (PAMELOR) 25 MG capsule Take 1 capsule (25 mg total) by mouth at bedtime. 09/06/21   Johnson, Megan P, DO  ondansetron (ZOFRAN ODT) 4 MG disintegrating tablet Take 1 tablet (4 mg total) by mouth every 8 (eight) hours as needed for nausea or vomiting. 07/10/21   Johnson, Megan P, DO  pantoprazole (PROTONIX) 20 MG tablet Take 20 mg by mouth daily. 08/10/21   [provider]  pantoprazole (PROTONIX) 40 MG tablet Take 1 tablet (40 mg total)  by mouth daily. 07/10/21 07/10/22  Johnson, Megan P, DO  polyethylene glycol (MIRALAX / GLYCOLAX) 17 g packet Take 17 g by mouth daily as needed. 11/24/20   Loletha Grayer, MD  predniSONE (DELTASONE) 10 MG tablet Take 10 mg by mouth daily with breakfast. Patient not taking: Reported on 09/06/2021    [provider]  tamsulosin (FLOMAX) 0.4 MG CAPS capsule Take 0.4 mg by mouth daily. 07/23/21   [provider]    Physical Exam: Vitals:   09/11/21 1100 09/11/21 1130 09/11/21 1145 09/11/21 1200  BP: 137/74 135/72  136/80  Pulse: 87 85 87 86  Resp: 19 (!) 25 (!) 26 (!) 24  Temp:      TempSrc:      SpO2: 97% 95% 96% 98%  Weight:      Height:         Vitals:   09/11/21 1100 09/11/21 1130 09/11/21 1145 09/11/21 1200  BP: 137/74 135/72  136/80  Pulse: 87 85 87 86  Resp: 19 (!) 25 (!) 26 (!) 24  Temp:      TempSrc:      SpO2: 97% 95% 96% 98%  Weight:      Height:          Constitutional: Alert and oriented x 3 . Not in any apparent distress HEENT:      Head: Normocephalic and atraumatic.         Eyes: PERLA, EOMI, Conjunctivae are normal. Sclera is non-icteric.       Mouth/Throat: Mucous  membranes are moist.       Neck: Supple with no signs of meningismus. Cardiovascular: Regular rate and rhythm. No murmurs, gallops, or rubs. 2+ symmetrical distal pulses are present . No JVD. No LE edema Respiratory: Respiratory effort normal .Lungs sounds clear bilaterally. No wheezes, crackles, or rhonchi.  Gastrointestinal: Soft, non tender, and non distended with positive bowel sounds.  PD catheter in place Genitourinary: No CVA tenderness. Musculoskeletal: Nontender with normal range of motion in all extremities. No cyanosis, or erythema of extremities. Neurologic:  Face is symmetric. Moving all extremities. No gross focal neurologic deficits . Skin: Skin is warm, dry.  No rash or ulcers Psychiatric: Mood and affect are normal    Labs on Admission: I have personally reviewed following labs and imaging studies  CBC: Recent Labs  Lab 09/11/21 0818  WBC 4.3  HGB 9.5*  HCT 30.1*  MCV 91.5  PLT 098*   Basic Metabolic Panel: Recent Labs  Lab 09/11/21 0818  NA 138  K 4.2  CL 104  CO2 23  GLUCOSE 88  BUN 61*  CREATININE 7.16*  CALCIUM 7.5*   GFR: Estimated Creatinine Clearance: 9.6 mL/min (A) (by C-G formula based on SCr of 7.16 mg/dL (H)). Liver Function Tests: Recent Labs  Lab 09/11/21 0818  AST 14*  ALT 17  ALKPHOS 45  BILITOT 1.0  PROT 5.8*  ALBUMIN 3.1*   Recent Labs  Lab 09/11/21 0818  LIPASE 27   No results for input(s): AMMONIA in the last 168 hours. Coagulation Profile: Recent Labs  Lab 09/11/21 0818  INR 1.1   Cardiac Enzymes: No results for input(s): CKTOTAL, CKMB, CKMBINDEX, TROPONINI in the last 168 hours. BNP (last 3 results) No results for input(s): PROBNP in the last 8760 hours. HbA1C: No results for input(s): HGBA1C in the last 72 hours. CBG: No results for input(s): GLUCAP in the last 168 hours. Lipid Profile: No results for input(s): CHOL, HDL, LDLCALC, TRIG, CHOLHDL, LDLDIRECT  in the last 72 hours. Thyroid Function Tests: No  results for input(s): TSH, T4TOTAL, FREET4, T3FREE, THYROIDAB in the last 72 hours. Anemia Panel: No results for input(s): VITAMINB12, FOLATE, FERRITIN, TIBC, IRON, RETICCTPCT in the last 72 hours. Urine analysis:    Component Value Date/Time   COLORURINE YELLOW (A) 03/01/2021 0719   APPEARANCEUR Clear 09/06/2021 1117   LABSPEC 1.009 03/01/2021 0719   PHURINE 6.0 03/01/2021 0719   GLUCOSEU Negative 09/06/2021 1117   HGBUR NEGATIVE 03/01/2021 0719   BILIRUBINUR Negative 09/06/2021 1117   KETONESUR NEGATIVE 03/01/2021 0719   PROTEINUR 2+ (A) 09/06/2021 1117   PROTEINUR 100 (A) 03/01/2021 0719   NITRITE Negative 09/06/2021 1117   NITRITE NEGATIVE 03/01/2021 0719   LEUKOCYTESUR Negative 09/06/2021 1117   LEUKOCYTESUR NEGATIVE 03/01/2021 0719    Radiological Exams on Admission: DG Chest 2 View  Result Date: 09/11/2021 CLINICAL DATA:  Chest pain 02/27/2021 EXAM: CHEST - 2 VIEW COMPARISON:  02/27/2021 FINDINGS: Mild hyperinflation. Scarring in the lung bases. No acute confluent opacities. Heart is normal size. No effusions or acute bony abnormality. IMPRESSION: Mild hyperinflation/chronic changes.  No active disease. Electronically Signed   By: Rolm Baptise M.D.   On: 09/11/2021 08:40   US Venous Img Lower Bilateral (DVT)  Result Date: 09/11/2021 CLINICAL DATA:  Unable to obtain CT.  Rule out DVT. EXAM: BILATERAL LOWER EXTREMITY VENOUS DOPPLER ULTRASOUND TECHNIQUE: Gray-scale sonography with graded compression, as well as color Doppler and duplex ultrasound were performed to evaluate the lower extremity deep venous systems from the level of the common femoral vein and including the common femoral, femoral, profunda femoral, popliteal and calf veins including the posterior tibial, peroneal and gastrocnemius veins when visible. The superficial great saphenous vein was also interrogated. Spectral Doppler was utilized to evaluate flow at rest and with distal augmentation maneuvers in the common  femoral, femoral and popliteal veins. COMPARISON:  Chest XR, 09/11/2021. FINDINGS: RIGHT LOWER EXTREMITY VENOUS Normal compressibility of the RIGHT common femoral, superficial femoral, and popliteal veins, as well as the visualized calf veins. Visualized portions of profunda femoral vein and great saphenous vein unremarkable. No filling defects to suggest DVT on grayscale or color Doppler imaging. Doppler waveforms show normal direction of venous flow, normal respiratory plasticity and response to augmentation. OTHER No evidence of superficial thrombophlebitis or abnormal fluid collection. Limitations: none LEFT LOWER EXTREMITY VENOUS Normal compressibility of the LEFT common femoral, superficial femoral, and popliteal veins, as well as the visualized calf veins. Visualized portions of profunda femoral vein and great saphenous vein unremarkable. No filling defects to suggest DVT on grayscale or color Doppler imaging. Doppler waveforms show normal direction of venous flow, normal respiratory plasticity and response to augmentation. OTHER None. Limitations: none IMPRESSION: No evidence of femoropopliteal DVT within either lower extremity. Michaelle Birks, MD [facility] Electronically Signed   By: Michaelle Birks M.D.   On: 09/11/2021 12:05     Assessment/Plan Principal Problem:   NSTEMI (non-ST elevated myocardial infarction) [facility]) Active Problems:   CAD (coronary artery disease)   Benign hypertensive renal disease   Tobacco abuse   BPH (benign prostatic hyperplasia)   Focal seizure (HCC)   ESRD on peritoneal dialysis [facility])     Patient is a 65 year old male admitted to the hospital for non-ST elevation MI    Non-ST elevation MI Patient has a history of coronary artery disease status post PCI with stent angioplasty in the past He presents for evaluation of sudden onset shortness  of breath and chest pain and has an uptrending troponin concerning  for non-ST elevation MI Continue heparin drip initiated in the ER Continue aspirin, statins and beta-blockers We will cycle cardiac enzymes Obtain 2D echocardiogram to rule out regional wall motion abnormality Consult cardiology    Nicotine dependence Smoking cessation has been discussed with patient in detail Will place patient on a nicotine transdermal patch 14 mg daily    End-stage renal disease on peritoneal dialysis We will request nephrology consult     History of focal seizures Continue Keppra    Depression Stable Continue Celexa and nortriptyline    History of BPH Stable Continue Flomax  DVT prophylaxis: Heparin Code Status: full code  Family Communication: Greater than 50% of time was spent discussing patient's condition and plan of care with him at the bedside.  All questions and concerns have been addressed.  He verbalizes understanding and agrees with the plan. Disposition Plan: Back to previous home environment Consults called: Nephrology/cardiology Status:At the time of admission, it appears that the appropriate admission status for this patient is inpatient. This is judged to be reasonable and necessary to provide the required intensity of service to ensure the patient's safety given the presenting symptoms, physical exam findings, and initial radiographic and laboratory data in the context of their comorbid conditions. Patient requires inpatient status due to high intensity of service, high risk for further deterioration and high frequency of surveillance required.     Collier Bullock MD [facility]     09/11/2021, 12:37 PM

## 2021-09-11 NOTE — ED Notes (Signed)
Sent rainbow to lab. 

## 2021-09-11 NOTE — Plan of Care (Signed)
Will educate patient on exercise and physical activity at a tolerable pace, and how to manage their medications, resources to help improve their nutrition, managing their diabetes and cholesterol, knowing the symptoms that come with the onset of their condition and and who to contact if they experience any of these symptoms

## 2021-09-11 NOTE — ED Provider Notes (Signed)
[facility] Emergency Department Provider Note  ____________________________________________   Event Date/Time   First MD Initiated Contact with Patient 09/11/21 1039     (approximate)  I have reviewed the triage vital signs and the nursing notes.   HISTORY  Chief Complaint Chest Pain and Shortness of Breath    HPI James Arnold is a 65 y.o. male who is status post stent in 1997, ESRD on peritoneal dialysis nightly who comes in for chest pain.  Patient reports that at 4 AM he got up to go to the bathroom when he came back he had sudden onset severe chest pain and shortness of breath.  Pain was in the middle of his chest, constant, severe, nothing made it better or worse.  Reports that it did not radiate into his back or down his arm.  He states that the shortness of breath was severe and he felt like he was going to die.  He then states that he symptoms are starting to get better.  He still feels a little short of breath and a little bit of chest pain, 4 out of 10.  Denies any recent falls, hitting his head.  Does have a history of some coffee-ground sputum but recent work-up was reassuring.  Denies any current bleeding.  Denies any recent long travel, recent surgery, leg swelling.          Past Medical History:  Diagnosis Date   Anemia    Benign hypertensive renal disease    CAD (coronary artery disease)    a. 08/1996 s/p BMS to the mLAD ([facility]); b. 12/2017 MV: EF 46%, fixed apical defect w/ significant GI uptake artifact. No ischemia-> low risk; c. 07/2020 MV: EF 51%, no ischemia/infarct.   Carotid arterial disease ([place])    a. 11/2020 Carotid U/S: <50% bilat ICA stenoses.   CKD (chronic kidney disease), stage V ([place])    on peritoneal dialysis   Complication of anesthesia    please call by "[name]" when waking up!!   COPD (chronic obstructive pulmonary disease) (HCC)    centrilobular emphysema. pt is poorly controlled with his copd/smoking.    Depression    Diastolic dysfunction    a. 12/2017 Echo: EF 60-65%, no rwma, Gr1 DD, mild AI/MR. Nl RVSP; b. 08/2020 Echo: EF 60-65%, no rwma, GrI DD, nl RV fxn. RVSP 32.79mmHg.   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 12/2020 Zio: Sinus rhythm 80 (55-117). Rare PACs/PVCs. 17 episodes of SVT (longest 20.3 secs, fastest 184). No triggered events. No afib.   Smoker    Stroke [facility])    a. 11/2020 MRI Brain: patchy acute/early subacute cortical infarcts w/in the R frontal, parietal, and occiptal lobes. Small, chronic L basal ganglia lacunar infarct.   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[place]) 12/10/2020   Anemia due to vitamin B12  deficiency    Acute CVA (cerebrovascular accident) ([place])    Anemia of chronic disease    Urinary retention    Focal seizure ([place]) 11/20/2020   Thrombocytopenia ([place]) 11/20/2020   ESRD (end stage renal disease) ([place]) 11/20/2020   Hyperkalemia 11/20/2020   Leukocytosis 11/20/2020   ESRD on peritoneal dialysis ([place]) 11/20/2020   Status post shoulder replacement    Rotator cuff arthropathy 11/19/2020   Tobacco abuse 11/07/2020   Hypertensive heart and chronic kidney disease with heart failure and with stage 5 chronic kidney disease, or end stage renal disease ([place]) 07/10/2020   Dialysis patient ([place]) 01/09/2020   Senile purpura ([place]) 01/09/2020    Centrilobular emphysema ([place]) 09/13/2019   Secondary hyperparathyroidism ([place]) 03/14/2019   BPH (benign prostatic hyperplasia) 10/24/2018   Depression 10/24/2018   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   Anemia of chronic kidney failure, stage 5 ([place]) 12/30/2017   Proteinuria 11/25/2017   Gout 08/27/2017   Primary myelofibrosis ([place])    Major depression, recurrent (HCC)    Benign hypertensive renal disease    CKD (chronic kidney disease) stage 5, GFR less than 15 ml/min (HCC)    Hyperlipidemia LDL goal <70 10/23/2002   CAD (coronary artery disease) 08/22/1996    Past Surgical History:  Procedure Laterality Date   ANGIOPLASTY     BONE MARROW ASPIRATION  03/2009   CARDIAC CATHETERIZATION  1997   stent x 1   COLONOSCOPY WITH PROPOFOL N/A 08/09/2018   Procedure: COLONOSCOPY WITH PROPOFOL;  Surgeon: [name], MD;  Location: [facility] ENDOSCOPY;  Service: Gastroenterology;  Laterality: N/A;   CORONARY STENT PLACEMENT  08/1996   ESOPHAGOGASTRODUODENOSCOPY (EGD) WITH PROPOFOL N/A 07/25/2021   Procedure: ESOPHAGOGASTRODUODENOSCOPY (EGD) WITH PROPOFOL;  Surgeon: [name], MD;  Location: [facility] ENDOSCOPY;  Service: Gastroenterology;  Laterality: N/A;   EXTERIORIZATION OF A CONTINUOUS AMBULATORY PERITONEAL DIALYSIS CATHETER     EYE SURGERY Bilateral    cats removed   REVERSE SHOULDER ARTHROPLASTY Left 11/19/2020   Procedure: Left reverse shoulder arthroplasty;  Surgeon: Leim Fabry, MD;  Location: [facility] ORS;  Service: Orthopedics;  Laterality: Left;    Prior to Admission medications   Medication Sig Start Date End Date Taking? Authorizing Provider  albuterol (VENTOLIN HFA) 108 (90 Base) MCG/ACT inhaler Inhale 2 puffs into the lungs every 6 (six) hours as needed for wheezing or shortness of breath. 02/27/21   Borders, Kirt Boys, NP  aspirin 81 MG chewable tablet Chew 1 tablet (81 mg total) by mouth daily. 01/08/21   Johnson, Megan P, DO  carvedilol (COREG) 25 MG tablet Take 1 tablet  (25 mg total) by mouth 2 (two) times daily. 06/13/21 09/11/21  Theora Gianotti, NP  Cholecalciferol (VITAMIN D-1000 MAX ST) 25 MCG (1000 UT) tablet Take 1,000 Units by mouth daily.     [provider]  citalopram (CELEXA) 40 MG tablet Take 1.5 tablets (60 mg total) by mouth daily. 09/06/21   Park Liter P, DO  ferrous sulfate 325 (65 FE) MG tablet Take 1 tablet (325 mg total) by mouth daily with breakfast. 12/30/17 08/06/23  Saundra Shelling, MD  furosemide (LASIX) 20 MG tablet Take 1 tablet (20 mg total) by mouth daily. 01/08/21   [place] Liter P, DO  gentamicin cream (GARAMYCIN) 0.1 % Apply 1 application topically every other day. 03/18/19   [provider]  levETIRAcetam (KEPPRA) 500 MG tablet Take 1 tablet (500 mg total) by mouth at bedtime. 11/24/20   Loletha Grayer, MD  nortriptyline (PAMELOR) 25 MG  capsule Take 1 capsule (25 mg total) by mouth at bedtime. 09/06/21   Johnson, Megan P, DO  ondansetron (ZOFRAN ODT) 4 MG disintegrating tablet Take 1 tablet (4 mg total) by mouth every 8 (eight) hours as needed for nausea or vomiting. 07/10/21   Johnson, Megan P, DO  pantoprazole (PROTONIX) 40 MG tablet Take 1 tablet (40 mg total) by mouth daily. 07/10/21 07/10/22  Johnson, Megan P, DO  polyethylene glycol (MIRALAX / GLYCOLAX) 17 g packet Take 17 g by mouth daily as needed. 11/24/20   Loletha Grayer, MD  predniSONE (DELTASONE) 10 MG tablet Take 10 mg by mouth daily with breakfast. Patient not taking: Reported on 09/06/2021    [provider]  tamsulosin (FLOMAX) 0.4 MG CAPS capsule Take 0.4 mg by mouth daily. 07/23/21   [provider]    Allergies Patient has no known allergies.  Family History  Problem Relation Age of Onset   Stroke Mother        Low BP stroke   Depression Father    Depression Sister        Breast   Heart disease Other    Breast cancer Other    Lung cancer Other    Ovarian cancer Other    Stomach cancer Other     Social  History Social History   Tobacco Use   Smoking status: Every Day    Packs/day: 0.50    Years: 47.00    Pack years: 23.50    Types: Cigarettes   Smokeless tobacco: Never   Tobacco comments:    trying to cut back-smokes 6 to 7 cigs per day  Vaping Use   Vaping Use: Never used  Substance Use Topics   Alcohol use: Not Currently    Comment: "6 beers weekly on Saturdays"   Drug use: No      Review of Systems Constitutional: No fever/chills Eyes: No visual changes. ENT: No sore throat. Cardiovascular: Chest pain Respiratory: Shortness of breath Gastrointestinal: No abdominal pain.  No nausea, no vomiting.  No diarrhea.  No constipation. Genitourinary: Negative for dysuria. Musculoskeletal: Negative for back pain. Skin: Negative for rash. Neurological: Negative for headaches, focal weakness or numbness. All other ROS negative ____________________________________________   PHYSICAL EXAM:  VITAL SIGNS: ED Triage Vitals  Enc Vitals Group     BP 09/11/21 0822 126/66     Pulse Rate 09/11/21 0822 92     Resp 09/11/21 0822 (!) 24     Temp 09/11/21 0820 (!) 97.4 F (36.3 C)     Temp Source 09/11/21 0820 Oral     SpO2 09/11/21 0822 98 %     Weight 09/11/21 0818 147 lb (66.7 kg)     Height 09/11/21 0818 5\' 7"  (1.702 m)     Head Circumference --      Peak Flow --      Pain Score 09/11/21 0818 7     Pain Loc --      Pain Edu? --      Excl. in [place]? --     Constitutional: Alert and oriented. Well appearing and in no acute distress. Eyes: Conjunctivae are normal. EOMI. Head: Atraumatic. Nose: No congestion/rhinnorhea. Mouth/Throat: Mucous membranes are moist.   Neck: No stridor. Trachea Midline. FROM Cardiovascular: Normal rate, regular rhythm. Grossly normal heart sounds.  Good peripheral circulation.  Equal radial pulses Respiratory: Normal respiratory effort.  No retractions. Lungs CTAB. Gastrointestinal: Soft and nontender. No distention. No abdominal bruits.   Peritoneal dialysis noted  Musculoskeletal: No lower extremity tenderness nor edema.  No joint effusions. Neurologic:  Normal speech and language. No gross focal neurologic deficits are appreciated.  Skin:  Skin is warm, dry and intact. No rash noted. Psychiatric: Mood and affect are normal. Speech and behavior are normal. GU: Deferred   ____________________________________________   LABS (all labs ordered are listed, but only abnormal results are displayed)  Labs Reviewed  BASIC METABOLIC PANEL - Abnormal; Notable for the following components:      Result Value   BUN 61 (*)    Creatinine, Ser 7.16 (*)    Calcium 7.5 (*)    GFR, Estimated 8 (*)    All other components within normal limits  CBC - Abnormal; Notable for the following components:   RBC 3.29 (*)    Hemoglobin 9.5 (*)    HCT 30.1 (*)    RDW 15.8 (*)    Platelets 125 (*)    All other components within normal limits  TROPONIN I (HIGH SENSITIVITY) - Abnormal; Notable for the following components:   Troponin I (High Sensitivity) 284 (*)    All other components within normal limits  TROPONIN I (HIGH SENSITIVITY)   ____________________________________________   ED ECG REPORT I, Vanessa [place], the attending physician, personally viewed and interpreted this ECG.  Normal sinus rate of 92, no ST elevation, no T wave inversions except for V5, and QTC 502  Repeat EKG remains sinus rate of 86 without any ST elevation, T wave inversion in V4 V5 and V6, QTC prolonged at 534 ____________________________________________  RADIOLOGY I, Vanessa [place], personally viewed and evaluated these images (plain radiographs) as part of my medical decision making, as well as reviewing the written report by the radiologist.  ED MD interpretation: No pneumonia  Official radiology report(s): DG Chest 2 View  Result Date: 09/11/2021 CLINICAL DATA:  Chest pain 02/27/2021 EXAM: CHEST - 2 VIEW COMPARISON:  02/27/2021 FINDINGS: Mild  hyperinflation. Scarring in the lung bases. No acute confluent opacities. Heart is normal size. No effusions or acute bony abnormality. IMPRESSION: Mild hyperinflation/chronic changes.  No active disease. Electronically Signed   By: Rolm Baptise M.D.   On: 09/11/2021 08:40    ____________________________________________   PROCEDURES  Procedure(s) performed (including Critical Care):  .1-3 Lead EKG Interpretation Performed by: Vanessa [place], MD Authorized by: Vanessa [place], MD     Interpretation: normal     ECG rate:  90s   ECG rate assessment: normal     Rhythm: sinus rhythm     Ectopy: none     Conduction: normal   .Critical Care Performed by: Vanessa [place], MD Authorized by: Vanessa [place], MD   Critical care provider statement:    Critical care time (minutes):  30   Critical care was time spent personally by me on the following activities:  Development of treatment plan with patient or surrogate, discussions with consultants, evaluation of patient's response to treatment, examination of patient, ordering and review of laboratory studies, ordering and review of radiographic studies, ordering and performing treatments and interventions, pulse oximetry, re-evaluation of patient's condition and review of old charts   ____________________________________________   INITIAL IMPRESSION / [place] / ED COURSE  James Arnold was evaluated in Emergency Department on 09/11/2021 for the symptoms described in the history of present illness. He was evaluated in the context of the global COVID-19 pandemic, which necessitated consideration that the patient might be at risk for infection with the SARS-CoV-2  virus that causes COVID-19. Institutional protocols and algorithms that pertain to the evaluation of patients at risk for COVID-19 are in a state of rapid change based on information released by regulatory bodies including the CDC and federal and state organizations. These policies and  algorithms were followed during the patient's care in the ED.    Patient comes in with chest pain, shortness of breath.  Suspect is most likely related to his heart given his symptoms are starting to resolve but he is high risk for PE given he is a dialysis patient although no other risk factors.  Patient reports he is not able to get any contrast.  Unclear why given he has been on dialysis for a long period of time he his oxygen levels are 98% although charted it was 92% he is not tachycardic therefore I have lower suspicion but we can add on D-dimer, ultrasounds just to stratify for need for VQ scan or CT.  His initial troponin was elevated making me more concerned about an NSTEMI.  Patient be kept on the cardiac monitor to evaluate for any arrhythmia.  Labs ordered evaluate for any other significant anemia or other concerns.  Kidney function is stably elevated but his troponin is significantly elevated at 284 he baseline runs in the 30s.  His hemoglobin is stable from 2 weeks ago.  To note he had recent endoscopy that showed some gastritis but no active bleeding  We discussed the risk for aspirin, heparin but at this time given the concern for NSTEMI I think that the benefits outweigh risk therefore we will proceed.  We will give some nitro, Bentyl to help with his pain but is now mostly resolved.  We will discussed with the hospital team for admission for NSTEMI.  They will consider further work-up based on D-dimer and ultrasounds for PE but seems less likely at this time.      ____________________________________________   FINAL CLINICAL IMPRESSION(S) / ED DIAGNOSES   Final diagnoses:  NSTEMI (non-ST elevated myocardial infarction) ([place])      MEDICATIONS GIVEN DURING THIS VISIT:  Medications  aspirin chewable tablet 324 mg (has no administration in time range)  nitroGLYCERIN (NITROSTAT) SL tablet 0.4 mg (has no administration in time range)  fentaNYL (SUBLIMAZE) injection 25 mcg (has  no administration in time range)  heparin bolus via infusion 4,000 Units (has no administration in time range)  heparin ADULT infusion 100 units/mL (25000 units/224mL) (has no administration in time range)     ED Discharge Orders     None        Note:  This document was prepared using Dragon voice recognition software and may include unintentional dictation errors.    Vanessa [place], MD 09/11/21 [phone]

## 2021-09-11 NOTE — ED Notes (Signed)
Pt and wife provided a drink and update on plan of care.

## 2021-09-11 NOTE — ED Notes (Signed)
Alaina RN aware of assigned bed

## 2021-09-11 NOTE — ED Notes (Signed)
Verified w/ lab that triage sent a blue top.

## 2021-09-11 NOTE — Consult Note (Addendum)
ANTICOAGULATION CONSULT NOTE - Initial Consult  Pharmacy Consult for Heparin drip Indication: chest pain/ACS  No Known Allergies  Patient Measurements: Height: 5\' 7"  (170.2 cm) Weight: 66.7 kg (147 lb) IBW/kg (Calculated) : 66.1 Heparin Dosing Weight: 66.7 kg  Vital Signs: Temp: 97.4 F (36.3 C) (12/21 0820) Temp Source: Oral (12/21 0820) BP: 124/72 (12/21 1027) Pulse Rate: 96 (12/21 1027)  Labs: Recent Labs    09/11/21 0818 09/11/21 1025  HGB 9.5*  --   HCT 30.1*  --   PLT 125*  --   CREATININE 7.16*  --   TROPONINIHS 284* 419*    Estimated Creatinine Clearance: 9.6 mL/min (A) (by C-G formula based on SCr of 7.16 mg/dL (H)).   Medical History: Past Medical History:  Diagnosis Date   Anemia    Benign hypertensive renal disease    CAD (coronary artery disease)    a. 08/1996 s/p BMS to the mLAD ([facility]); b. 12/2017 MV: EF 46%, fixed apical defect w/ significant GI uptake artifact. No ischemia-> low risk; c. 07/2020 MV: EF 51%, no ischemia/infarct.   Carotid arterial disease ([place])    a. 11/2020 Carotid U/S: <50% bilat ICA stenoses.   CKD (chronic kidney disease), stage V ([place])    on peritoneal dialysis   Complication of anesthesia    please call by "[name]" when waking up!!   COPD (chronic obstructive pulmonary disease) (HCC)    centrilobular emphysema. pt is poorly controlled with his copd/smoking.   Depression    Diastolic dysfunction    a. 12/2017 Echo: EF 60-65%, no rwma, Gr1 DD, mild AI/MR. Nl RVSP; b. 08/2020 Echo: EF 60-65%, no rwma, GrI DD, nl RV fxn. RVSP 32.80mmHg.   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 12/2020 Zio: Sinus rhythm 80  (55-117). Rare PACs/PVCs. 17 episodes of SVT (longest 20.3 secs, fastest 184). No triggered events. No afib.   Smoker    Stroke [facility])    a. 11/2020 MRI Brain: patchy acute/early subacute cortical infarcts w/in the R frontal, parietal, and occiptal lobes. Small, chronic L basal ganglia lacunar infarct.   Thrombocytopenia ([place])     Medications:  (Not in a hospital admission)   Assessment: Patient admitted with chest pain and SOB. All baseline labs already drawn. Troponin elevated and trending upward. Pharmacy consulted to manage heparin infusion for ACS/STEMI. NO DOAC or warfarin PTA.  Noted ESRD - on peritoneal dialysis  Goal of Therapy:  Heparin level 0.3-0.7 units/ml Monitor platelets by anticoagulation protocol: Yes   Plan:  Give 4000 units bolus x 1 Start heparin infusion at 800 units/hr Check anti-Xa level in 8 hours and daily while on heparin Continue to monitor H&H and platelets  Yeira Gulden Rodriguez-Guzman PharmD, BCPS 09/11/2021 11:17 AM

## 2021-09-11 NOTE — Consult Note (Signed)
ANTICOAGULATION CONSULT NOTE  Pharmacy Consult for Heparin drip Indication: chest pain/ACS  Patient Measurements: Height: 5\' 7"  (170.2 cm) Weight: 68.9 kg (151 lb 14.4 oz) IBW/kg (Calculated) : 66.1 Heparin Dosing Weight: 66.7 kg  Labs: Recent Labs    09/11/21 0818 09/11/21 1025 09/11/21 1430 09/11/21 1629 09/11/21 1938  HGB 9.5*  --   --   --   --   HCT 30.1*  --   --   --   --   PLT 125*  --   --   --   --   APTT 30  --   --   --   --   LABPROT 14.4  --   --   --   --   INR 1.1  --   --   --   --   HEPARINUNFRC  --   --   --   --  >0.10*  CREATININE 7.16*  --   --   --   --   TROPONINIHS 284* 419* 495* 488*  --      Estimated Creatinine Clearance: 9.6 mL/min (A) (by C-G formula based on SCr of 7.16 mg/dL (H)).   Medical History: Past Medical History:  Diagnosis Date   Anemia    Benign hypertensive renal disease    CAD (coronary artery disease)    a. 08/1996 s/p BMS to the [facility]AD ([facility]); b. 12/2017 MV: EF 46%, fixed apical defect w/ significant GI uptake artifact. No ischemia-> low risk; c. 07/2020 MV: EF 51%, no ischemia/infarct.   Carotid arterial disease ([place])    a. 11/2020 Carotid U/S: <50% bilat ICA stenoses.   CKD (chronic kidney disease), stage V ([place])    on peritoneal dialysis   Complication of anesthesia    please call by "[name]" when waking up!!   COPD (chronic obstructive pulmonary disease) (HCC)    centrilobular emphysema. pt is poorly controlled with his copd/smoking.   Depression    Diastolic dysfunction    a. 12/2017 Echo: EF 60-65%, no rwma, Gr1 DD, mild AI/MR. Nl RVSP; b. 08/2020 Echo: EF 60-65%, no rwma, GrI DD, nl RV fxn. RVSP 32.68mmHg.   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 12/2020 Zio: Sinus rhythm 80 (55-117). Rare PACs/PVCs. 17 episodes of SVT (longest 20.3 secs, fastest 184). No triggered events. No afib.   Smoker    Stroke [facility])    a. 11/2020 MRI Brain: patchy acute/early subacute cortical infarcts w/in the R frontal, parietal, and occiptal lobes. Small, chronic L basal ganglia lacunar infarct.   Thrombocytopenia ([place])    Assessment: Patient admitted with chest pain and SOB. All baseline labs already drawn. Troponin elevated and trending upward. Pharmacy consulted to manage heparin infusion for ACS/STEMI. NO DOAC or warfarin PTA.  Noted ESRD - on peritoneal dialysis  Goal of Therapy:  Heparin level 0.3-0.7 units/ml Monitor platelets by anticoagulation protocol: Yes   Plan:  --Heparin level is subtherapeutic --Heparin 2000 unit IV bolus and increase heparin infusion to 1000 units/hr --Re-check HL 8 hours after rate change --Daily CBC per protocol while on IV heparin  Benita Gutter  09/11/2021 8:20 PM

## 2021-09-11 NOTE — Consult Note (Signed)
Cardiology Consult    Patient ID: TUNIS GENTLE MRN: [id number], DOB/AGE: 29-Jan-1956   Admit date: 09/11/2021 Date of Consult: 09/11/2021  Primary Physician: Valerie Roys, DO Primary Cardiologist: Nelva Bush, MD Requesting Provider: Milon Dikes, MD  Patient Profile    MAHKI SPIKES is a 65 y.o. 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 Francine Graven.  Past Medical History   Past Medical History:  Diagnosis Date   Anemia    Benign hypertensive renal disease    CAD (coronary artery disease)    a. 08/1996 s/p BMS to the mLAD ([facility]); b. 12/2017 MV: EF 46%, fixed apical defect w/ significant GI uptake artifact. No ischemia-> low risk; c. 07/2020 MV: EF 51%, no ischemia/infarct.   Carotid arterial disease ([place])    a. 11/2020 Carotid U/S: <50% bilat ICA stenoses.   CKD (chronic kidney disease), stage V ([place])    on peritoneal dialysis   Complication of anesthesia    please call by "[name]" when waking up!!   COPD (chronic obstructive pulmonary disease) (HCC)    centrilobular emphysema. pt is poorly controlled with his copd/smoking.   Depression    Diastolic dysfunction    a. 12/2017 Echo: EF 60-65%, no rwma, Gr1 DD, mild AI/MR. Nl RVSP; b. 08/2020 Echo: EF 60-65%, no rwma, GrI DD, nl RV fxn. RVSP 32.62mmHg.   Dyspnea on exertion    with exertion   Fatigue    FSGS (focal segmental glomerulosclerosis)    Hyperlipidemia 10/2002   Hypertension    myelofibrosis    bone marrow failure   Myelofibrosis ([place]) 2010   JAK2 (+) primary   Myocardial infarction ([place]) 1997   stent x 1   Pneumoperitoneum 02/2021   PSVT (paroxysmal supraventricular tachycardia) ([place])    a. 12/2020 Zio: Sinus rhythm 80 (55-117). Rare PACs/PVCs. 17  episodes of SVT (longest 20.3 secs, fastest 184). No triggered events. No afib.   Smoker    Stroke [facility])    a. 11/2020 MRI Brain: patchy acute/early subacute cortical infarcts w/in the R frontal, parietal, and occiptal lobes. Small, chronic L basal ganglia lacunar infarct.   Thrombocytopenia [facility])     Past Surgical History:  Procedure Laterality Date   ANGIOPLASTY     BONE MARROW ASPIRATION  03/2009   CARDIAC CATHETERIZATION  1997   stent x 1   COLONOSCOPY WITH PROPOFOL N/A 08/09/2018   Procedure: COLONOSCOPY WITH PROPOFOL;  Surgeon: [name], MD;  Location: [facility] ENDOSCOPY;  Service: Gastroenterology;  Laterality: N/A;   CORONARY STENT PLACEMENT  08/1996   ESOPHAGOGASTRODUODENOSCOPY (EGD) WITH PROPOFOL N/A 07/25/2021   Procedure: ESOPHAGOGASTRODUODENOSCOPY (EGD) WITH PROPOFOL;  Surgeon: [name], MD;  Location: [facility] ENDOSCOPY;  Service: Gastroenterology;  Laterality: N/A;   EXTERIORIZATION OF A CONTINUOUS AMBULATORY PERITONEAL DIALYSIS CATHETER     EYE SURGERY Bilateral    cats removed   REVERSE SHOULDER ARTHROPLASTY Left 11/19/2020   Procedure: Left reverse shoulder arthroplasty;  Surgeon: Leim Fabry, MD;  Location: [facility] ORS;  Service: Orthopedics;  Laterality: Left;     Allergies  No Known Allergies  History of Present Illness    65 year old male with a history of CAD, hypertension, hyperlipidemia, diastolic dysfunction, tobacco abuse, primary myelofibrosis, 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  Cardiac history is notable for bare-metal stenting of the mid LAD in 1997 at [facility].  He was hospitalized in 2019 with chest pain and underwent echocardiogram which showed normal LV function.  Stress testing was performed and was low risk.  November 2021, he was seen in the emergency department with recurrent chest pain mild troponin elevation to 36.  Stress testing August 21, 2020 was low risk without evidence of  ischemia or infarct.  Echocardiogram in December 2021 showed an EF of 60 to 65%.  In late February of this year, he underwent left rotator cuff arthroplasty with postoperative development of seizure and stroke.  Head CT showed remote right frontal and parietal cortical infarcts while MRI of the brain showed acute/early subacute cortical infarcts within the right frontal, parietal, and occipital lobes.  Echo showed an EF of 50 to 55% with grade 1 diastolic dysfunction.  Bubble study was negative.  Carotid arterial duplex showed less than 50% bilateral internal carotid artery stenoses.  Recommendation was made for TEE and placement of an implantable loop recorder however patient declined both at outpatient follow-up in March.  A Zio monitor was placed and did not show any evidence of atrial fibrillation.  He did have 17 brief episodes of SVT.  There were no triggered events.   Patient was last seen in cardiology clinic in September, at which time he noted ongoing tobacco abuse and chronic, stable dyspnea on exertion.  He notes that since then, he has had persistent dyspnea on exertion and chronic fatigue.  He still smokes about half a pack of cigarettes per day.  He was in his usual state of health until approximately 3 AM this morning, when he awoke to use the bathroom.  He noted dyspnea while walking to the bathroom and upon returning to bed, dyspnea became worse with a throbbing sensation in his chest.  He denies palpitations.  He had his son get his inhaler however, this did not improve symptoms and his wife brought him into the [facility] ED.  Here,his ECG showed sinus rhythm, 86, LVH with repolarization abnormalities.  Initial troponin was elevated at 284 with subsequent troponins of 419  495.  If stable, normocytic anemia.  D-dimer was mildly elevated at 1.03.  Chest x-ray shows chronic changes without acute disease.  A lower extremity ultrasound was performed and shows no evidence of DVT.  He was placed on  heparin and is currently resting comfortably.  Inpatient Medications     [START ON 09/12/2021] aspirin  81 mg Oral Daily   atorvastatin  40 mg Oral Daily   carvedilol  25 mg Oral BID WC   [START ON 09/12/2021] cholecalciferol  1,000 Units Oral Daily   [START ON 09/12/2021] citalopram  60 mg Oral Daily   [START ON 09/12/2021] ferrous sulfate  325 mg Oral Q breakfast   [START ON 09/12/2021] furosemide  20 mg Oral Daily   levETIRAcetam  500 mg Oral QHS   nicotine  14 mg Transdermal Daily   nortriptyline  25 mg Oral QHS   [START ON 09/12/2021] pantoprazole  40 mg Oral Daily   [START ON 09/12/2021] tamsulosin  0.4 mg Oral Daily    Family History    Family History  Problem Relation Age of Onset   Stroke Mother        Low BP stroke   Depression Father    Depression Sister        Breast   Heart disease Other    Breast cancer  Other    Lung cancer Other    Ovarian cancer Other    Stomach cancer Other    He indicated that the status of his mother is unknown. He indicated that the status of his father is unknown. He indicated that the status of his sister is unknown.   Social History    Social History   Socioeconomic History   Marital status: Married    Spouse name: [name]   Number of children: Not on file   Years of education: Not on file   Highest education level: Not on file  Occupational History   Occupation: dt myelofibrosis    Comment: disabled since 2010  Tobacco Use   Smoking status: Every Day    Packs/day: 0.50    Years: 47.00    Pack years: 23.50    Types: Cigarettes   Smokeless tobacco: Never   Tobacco comments:    trying to cut back-smokes 6 to 7 cigs per day  Vaping Use   Vaping Use: Never used  Substance and Sexual Activity   Alcohol use: Not Currently    Comment: "6 beers weekly on Saturdays"   Drug use: No   Sexual activity: Yes  Other Topics Concern   Not on file  Social History Narrative   Patient lives with wife in his home.   Not working dt  disability.    Has been placed on kidney transplant evaluation list, but does not qualify d/t smoking and myelofibrosis   Social Determinants of Health   Financial Resource Strain: Low Risk    Difficulty of Paying Living Expenses: Not hard at all  Food Insecurity: No Food Insecurity   Worried About Charity fundraiser in the Last Year: Never true   Ran Out of Food in the Last Year: Never true  Transportation Needs: No Transportation Needs   Lack of Transportation (Medical): No   Lack of Transportation (Non-Medical): No  Physical Activity: Inactive   Days of Exercise per Week: 0 days   Minutes of Exercise per Session: 0 min  Stress: No Stress Concern Present   Feeling of Stress : Not at all  Social Connections: Not on file  Intimate Partner Violence: Not on file     Review of Systems    General:  No chills, fever, night sweats or weight changes.  Cardiovascular:  +++ chest throbbing sensation, +++ dyspnea, no edema, orthopnea, palpitations, paroxysmal nocturnal dyspnea. Dermatological: No rash, lesions/masses Respiratory: No cough, +++ dyspnea Urologic: No hematuria, dysuria Abdominal:   No nausea, vomiting, diarrhea, bright red blood per rectum, melena, or hematemesis Neurologic:  No visual changes, wkns, changes in mental status. All other systems reviewed and are otherwise negative except as noted above.  Physical Exam    Blood pressure 120/60, pulse 83, temperature (!) 97.4 F (36.3 C), temperature source Oral, resp. rate (!) 23, height 5\' 7"  (1.702 m), weight 66.7 kg, SpO2 96 %.  General: Pleasant, NAD Psych: Normal affect. Neuro: Alert and oriented X 3. Moves all extremities spontaneously. HEENT: Normal  Neck: Supple without bruits or JVD. Lungs:  Resp regular and unlabored, scattered rhonchi. Heart: RRR no s3, s4, 1/6 systolic murmur at the upper sternal borders. Abdomen: Soft, non-tender, non-distended, BS + x 4.  Extremities: No clubbing, cyanosis or edema.  DP/PT2+, Radials 2+ and equal bilaterally.  Labs    Cardiac Enzymes Recent Labs  Lab 09/11/21 0818 09/11/21 1025 09/11/21 1430  TROPONINIHS 284* 419* 495*      Lab Results  Component Value Date   WBC 4.3 09/11/2021   HGB 9.5 (L) 09/11/2021   HCT 30.1 (L) 09/11/2021   MCV 91.5 09/11/2021   PLT 125 (L) 09/11/2021    Recent Labs  Lab 09/11/21 0818  NA 138  K 4.2  CL 104  CO2 23  BUN 61*  CREATININE 7.16*  CALCIUM 7.5*  PROT 5.8*  BILITOT 1.0  ALKPHOS 45  ALT 17  AST 14*  GLUCOSE 88   Lab Results  Component Value Date   CHOL 160 07/10/2021   HDL 28 (L) 07/10/2021   LDLCALC 110 (H) 07/10/2021   TRIG 120 07/10/2021   Lab Results  Component Value Date   DDIMER 1.03 (H) 09/11/2021     Radiology Studies    DG Chest 2 View  Result Date: 09/11/2021 CLINICAL DATA:  Chest pain 02/27/2021 EXAM: CHEST - 2 VIEW COMPARISON:  02/27/2021 FINDINGS: Mild hyperinflation. Scarring in the lung bases. No acute confluent opacities. Heart is normal size. No effusions or acute bony abnormality. IMPRESSION: Mild hyperinflation/chronic changes.  No active disease. Electronically Signed   By: Rolm Baptise M.D.   On: 09/11/2021 08:40   US Venous Img Lower Bilateral (DVT)  Result Date: 09/11/2021 CLINICAL DATA:  Unable to obtain CT.  Rule out DVT. EXAM: BILATERAL LOWER EXTREMITY VENOUS DOPPLER ULTRASOUND TECHNIQUE: Gray-scale sonography with graded compression, as well as color Doppler and duplex ultrasound were performed to evaluate the lower extremity deep venous systems from the level of the common femoral vein and including the common femoral, femoral, profunda femoral, popliteal and calf veins including the posterior tibial, peroneal and gastrocnemius veins when visible. The superficial great saphenous vein was also interrogated. Spectral Doppler was utilized to evaluate flow at rest and with distal augmentation maneuvers in the common femoral, femoral and popliteal veins. COMPARISON:   Chest XR, 09/11/2021. FINDINGS: RIGHT LOWER EXTREMITY VENOUS Normal compressibility of the RIGHT common femoral, superficial femoral, and popliteal veins, as well as the visualized calf veins. Visualized portions of profunda femoral vein and great saphenous vein unremarkable. No filling defects to suggest DVT on grayscale or color Doppler imaging. Doppler waveforms show normal direction of venous flow, normal respiratory plasticity and response to augmentation. OTHER No evidence of superficial thrombophlebitis or abnormal fluid collection. Limitations: none LEFT LOWER EXTREMITY VENOUS Normal compressibility of the LEFT common femoral, superficial femoral, and popliteal veins, as well as the visualized calf veins. Visualized portions of profunda femoral vein and great saphenous vein unremarkable. No filling defects to suggest DVT on grayscale or color Doppler imaging. Doppler waveforms show normal direction of venous flow, normal respiratory plasticity and response to augmentation. OTHER None. Limitations: none IMPRESSION: No evidence of femoropopliteal DVT within either lower extremity. Michaelle Birks, MD [facility] Electronically Signed   By: Michaelle Birks M.D.   On: 09/11/2021 12:05    ECG & Cardiac Imaging    Regular sinus rhythm, 86, LVH, repolarization abnormality- personally reviewed.  Assessment & Plan    1.  Non-STEMI/CAD: Status post bare-metal stenting of the LAD in 1997.  Low risk stress test in November 2011.  He has chronic, stable dyspnea on exertion but developed worsening dyspnea overnight associated with a throbbing sensation in his chest.  His wife drove him to the emergency department.  ECG is notable for LVH with repolarization abnormalities which may be slightly more pronounced than on prior ECGs.  High-sensitivity troponin is elevated at 284  419  495.  Heparin has  been started.  He currently denies chest pain or dyspnea.  Agree  with aspirin, beta-blocker, and statin therapy (previously has refused statins).  We will keep n.p.o. after midnight as patient ideally would undergo diagnostic catheterization but in the setting of end-stage renal disease on peritoneal dialysis, would like nephrology to weigh in first.  2.  Essential hypertension: Stable.  Continue beta-blocker therapy.  3.  Hyperlipidemia: LDL of 117 in April 2022.  He has previously refused statins.  Atorvastatin has been ordered here.  4.  End-stage renal disease: On peritoneal dialysis.  He does make some urine and takes oral Lasix at home.  Recommend nephrology consultation in the setting of plan for contrast.  5.  Tobacco abuse: Still smoking half a pack a day.  Cessation advised.  6.  Myelofibrosis: With progressive splenomegaly.  Followed by hematology.  7.  History of stroke: Residual left-sided paresthesias without weakness.  Previously offered TEE and implantable loop recorder however he declined.  8.  Normocytic anemia: Relatively stable with hemoglobin/hematocrit of 9.5/30.1.  Signed, Murray Hodgkins, NP 09/11/2021, 3:28 PM  For questions or updates, please contact   Please consult [email or web address] for contact info under Cardiology/STEMI.

## 2021-09-11 NOTE — ED Notes (Signed)
Pt and wife report he does not take statins.  Sts his wife had an adverse reaction to one previously, so he will not take any.

## 2021-09-11 NOTE — ED Notes (Signed)
Lab notified of D-dimer order.

## 2021-09-12 ENCOUNTER — Other Ambulatory Visit: Payer: Self-pay | Admitting: Cardiovascular Disease

## 2021-09-12 ENCOUNTER — Inpatient Hospital Stay
Admission: EM | Disposition: A | Discharge status: Other Acute Inpatient Hospital | Payer: Self-pay | Source: Home / Self Care | Attending: Hospitalist

## 2021-09-12 ENCOUNTER — Other Ambulatory Visit: Payer: Self-pay

## 2021-09-12 ENCOUNTER — Inpatient Hospital Stay (HOSPITAL_COMMUNITY)
Admit: 2021-09-12 | Discharge: 2021-09-12 | Disposition: A | Discharge status: Home / Self Care | Payer: Medicare HMO | Attending: Cardiovascular Disease | Admitting: Cardiovascular Disease

## 2021-09-12 DIAGNOSIS — I2510 Atherosclerotic heart disease of native coronary artery without angina pectoris: Secondary | ICD-10-CM

## 2021-09-12 DIAGNOSIS — I214 Non-ST elevation (NSTEMI) myocardial infarction: Principal | ICD-10-CM

## 2021-09-12 DIAGNOSIS — J432 Centrilobular emphysema: Secondary | ICD-10-CM

## 2021-09-12 DIAGNOSIS — I25110 Atherosclerotic heart disease of native coronary artery with unstable angina pectoris: Secondary | ICD-10-CM

## 2021-09-12 DIAGNOSIS — R079 Chest pain, unspecified: Secondary | ICD-10-CM

## 2021-09-12 DIAGNOSIS — Z720 Tobacco use: Secondary | ICD-10-CM

## 2021-09-12 DIAGNOSIS — N186 End stage renal disease: Secondary | ICD-10-CM

## 2021-09-12 DIAGNOSIS — Z992 Dependence on renal dialysis: Secondary | ICD-10-CM | POA: Diagnosis not present

## 2021-09-12 DIAGNOSIS — G43009 Migraine without aura, not intractable, without status migrainosus: Secondary | ICD-10-CM

## 2021-09-12 HISTORY — PX: LEFT HEART CATH AND CORONARY ANGIOGRAPHY: CATH118249

## 2021-09-12 LAB — ECHOCARDIOGRAM COMPLETE
AR max vel: 1.8 cm2
AV Area VTI: 1.73 cm2
AV Area mean vel: 1.7 cm2
AV Mean grad: 14 mmHg
AV Peak grad: 23.5 mmHg
Ao pk vel: 2.43 m/s
Area-P 1/2: 6.32 cm2
Height: 67 in
MV VTI: 2.49 cm2
S' Lateral: 3.96 cm
Weight: 2536.17 oz

## 2021-09-12 LAB — CBC
HCT: 25.1 % — ABNORMAL LOW (ref 39.0–52.0)
Hemoglobin: 8.1 g/dL — ABNORMAL LOW (ref 13.0–17.0)
MCH: 29 pg (ref 26.0–34.0)
MCHC: 32.3 g/dL (ref 30.0–36.0)
MCV: 90 fL (ref 80.0–100.0)
Platelets: 101 10*3/uL — ABNORMAL LOW (ref 150–400)
RBC: 2.79 MIL/uL — ABNORMAL LOW (ref 4.22–5.81)
RDW: 15.7 % — ABNORMAL HIGH (ref 11.5–15.5)
WBC: 3.3 10*3/uL — ABNORMAL LOW (ref 4.0–10.5)
nRBC: 0 % (ref 0.0–0.2)

## 2021-09-12 LAB — BASIC METABOLIC PANEL
Anion gap: 10 (ref 5–15)
BUN: 62 mg/dL — ABNORMAL HIGH (ref 8–23)
CO2: 22 mmol/L (ref 22–32)
Calcium: 7.9 mg/dL — ABNORMAL LOW (ref 8.9–10.3)
Chloride: 104 mmol/L (ref 98–111)
Creatinine, Ser: 7.56 mg/dL — ABNORMAL HIGH (ref 0.61–1.24)
GFR, Estimated: 7 mL/min — ABNORMAL LOW (ref >60)
Glucose, Bld: 95 mg/dL (ref 70–99)
Potassium: 4.2 mmol/L (ref 3.5–5.1)
Sodium: 136 mmol/L (ref 135–145)

## 2021-09-12 LAB — GLUCOSE, CAPILLARY: Glucose-Capillary: 123 mg/dL — ABNORMAL HIGH (ref 70–99)

## 2021-09-12 LAB — HEPARIN LEVEL (UNFRACTIONATED): Heparin Unfractionated: <0.1 IU/mL — ABNORMAL LOW (ref 0.30–0.70)

## 2021-09-12 SURGERY — LEFT HEART CATH AND CORONARY ANGIOGRAPHY
Anesthesia: Moderate Sedation

## 2021-09-12 MED ORDER — SODIUM CHLORIDE 0.9 % IV SOLN: 250.0000 mL | INTRAVENOUS | Status: DC | PRN

## 2021-09-12 MED ORDER — MIDAZOLAM HCL 2 MG/2ML IJ SOLN
INTRAMUSCULAR | Status: DC | PRN
  Administered 2021-09-12: 1 mg via INTRAVENOUS

## 2021-09-12 MED ORDER — HEPARIN SODIUM (PORCINE) 1000 UNIT/ML IJ SOLN
INTRAMUSCULAR | Status: DC | PRN
  Administered 2021-09-12: 3600 [IU] via INTRAVENOUS

## 2021-09-12 MED ORDER — ASPIRIN 81 MG PO CHEW: 81.0000 mg | CHEWABLE_TABLET | ORAL | Status: DC

## 2021-09-12 MED ORDER — VERAPAMIL HCL 2.5 MG/ML IV SOLN
INTRAVENOUS | Status: DC | PRN
  Administered 2021-09-12: 2.5 mg via INTRA_ARTERIAL

## 2021-09-12 MED ORDER — SODIUM CHLORIDE 0.9% FLUSH: 3.0000 mL | INTRAVENOUS | Status: DC | PRN

## 2021-09-12 MED ORDER — NITROGLYCERIN 0.4 MG SL SUBL
0.4000 mg | SUBLINGUAL_TABLET | SUBLINGUAL | 12 refills | Status: AC | PRN
Start: 2021-09-12 — End: ?

## 2021-09-12 MED ORDER — LIDOCAINE HCL 1 % IJ SOLN
INTRAMUSCULAR | Status: AC
  Filled 2021-09-12: qty 20

## 2021-09-12 MED ORDER — IOHEXOL 300 MG/ML  SOLN
INTRAMUSCULAR | Status: DC | PRN
  Administered 2021-09-12: 16:00:00 44 mL

## 2021-09-12 MED ORDER — PANTOPRAZOLE SODIUM 20 MG PO TBEC
20.0000 mg | DELAYED_RELEASE_TABLET | Freq: Every day | ORAL | Status: DC
  Administered 2021-09-13: 09:00:00 20 mg via ORAL
  Filled 2021-09-12 (×2): qty 1

## 2021-09-12 MED ORDER — NICOTINE 21 MG/24HR TD PT24: 21.0000 mg | MEDICATED_PATCH | Freq: Every day | TRANSDERMAL | Status: DC

## 2021-09-12 MED ORDER — HEPARIN (PORCINE) 25000 UT/250ML-% IV SOLN
1400.0000 [IU]/h | INTRAVENOUS | Status: DC
  Administered 2021-09-12: 22:00:00 1200 [IU]/h via INTRAVENOUS
  Filled 2021-09-12: qty 250

## 2021-09-12 MED ORDER — FENTANYL CITRATE (PF) 100 MCG/2ML IJ SOLN
INTRAMUSCULAR | Status: DC | PRN
  Administered 2021-09-12: 25 ug via INTRAVENOUS

## 2021-09-12 MED ORDER — SODIUM CHLORIDE 0.9 % WEIGHT BASED INFUSION
1.0000 mL/kg/h | INTRAVENOUS | Status: DC
  Administered 2021-09-12: 10:00:00 1 mL/kg/h via INTRAVENOUS

## 2021-09-12 MED ORDER — HEPARIN (PORCINE) IN NACL 1000-0.9 UT/500ML-% IV SOLN
INTRAVENOUS | Status: AC
  Filled 2021-09-12: qty 1000

## 2021-09-12 MED ORDER — MIDAZOLAM HCL 2 MG/2ML IJ SOLN
INTRAMUSCULAR | Status: AC
  Filled 2021-09-12: qty 2

## 2021-09-12 MED ORDER — ATORVASTATIN CALCIUM 40 MG PO TABS: 40.0000 mg | ORAL_TABLET | Freq: Every day | ORAL | Status: DC

## 2021-09-12 MED ORDER — HEPARIN (PORCINE) 25000 UT/250ML-% IV SOLN: 1200.0000 [IU]/h | INTRAVENOUS | Status: DC

## 2021-09-12 MED ORDER — HEPARIN BOLUS VIA INFUSION
2000.0000 [IU] | Freq: Once | INTRAVENOUS | Status: AC
  Administered 2021-09-12: 12:00:00 2000 [IU] via INTRAVENOUS
  Filled 2021-09-12: qty 2000

## 2021-09-12 MED ORDER — VERAPAMIL HCL 2.5 MG/ML IV SOLN
INTRAVENOUS | Status: AC
  Filled 2021-09-12: qty 2

## 2021-09-12 MED ORDER — FENTANYL CITRATE (PF) 100 MCG/2ML IJ SOLN
INTRAMUSCULAR | Status: AC
  Filled 2021-09-12: qty 2

## 2021-09-12 MED ORDER — SODIUM CHLORIDE 0.9 % WEIGHT BASED INFUSION: 3.0000 mL/kg/h | INTRAVENOUS | Status: DC

## 2021-09-12 MED ORDER — SODIUM CHLORIDE 0.9% FLUSH
3.0000 mL | Freq: Two times a day (BID) | INTRAVENOUS | Status: DC
  Administered 2021-09-13 (×2): 3 mL via INTRAVENOUS

## 2021-09-12 MED ORDER — LIDOCAINE HCL (PF) 1 % IJ SOLN
INTRAMUSCULAR | Status: DC | PRN
  Administered 2021-09-12: 2 mL

## 2021-09-12 MED ORDER — HEPARIN SODIUM (PORCINE) 1000 UNIT/ML IJ SOLN
INTRAMUSCULAR | Status: AC
  Filled 2021-09-12: qty 10

## 2021-09-12 MED ORDER — HEPARIN (PORCINE) IN NACL 2000-0.9 UNIT/L-% IV SOLN
INTRAVENOUS | Status: DC | PRN
  Administered 2021-09-12: 1000 mL

## 2021-09-12 SURGICAL SUPPLY — 10 items
CATH OPTITORQUE JACKY 4.0 5F (CATHETERS) ×2 IMPLANT
DEVICE RAD TR BAND REGULAR (VASCULAR PRODUCTS) ×2 IMPLANT
DRAPE BRACHIAL (DRAPES) ×2 IMPLANT
GLIDESHEATH SLEND SS 6F .021 (SHEATH) ×2 IMPLANT
GUIDEWIRE INQWIRE 1.5J.035X260 (WIRE) IMPLANT
INQWIRE 1.5J .035X260CM (WIRE) ×3
PACK CARDIAC CATH (CUSTOM PROCEDURE TRAY) ×3 IMPLANT
PROTECTION STATION PRESSURIZED (MISCELLANEOUS) ×3
SET ATX SIMPLICITY (MISCELLANEOUS) ×2 IMPLANT
STATION PROTECTION PRESSURIZED (MISCELLANEOUS) IMPLANT

## 2021-09-12 NOTE — Progress Notes (Signed)
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  Patient presents to the emergency department with complaints of chest pain and discomfort.  Patient will be admitted for Leg swelling [M79.89] NSTEMI (non-ST elevated myocardial infarction) [facility]) [I21.4]  Patient is known to our clinic and receives peritoneal dialysis supervision at [facility] with Dr. Holley Raring.    Update: Patient sitting up in bed Wife at bedside States he had a couple episodes of chest pain overnight Denies shortness of breath  Objective:  Vital signs in last 24 hours:  Temp:  [97.3 F (36.3 C)-98.4 F (36.9 C)] 97.8 F (36.6 C) (12/22 1157) Pulse Rate:  [79-110] 94 (12/22 1157) Resp:  [16-27] 18 (12/22 1157) BP: (101-144)/(57-90) 101/57 (12/22 1157) SpO2:  [93 %-100 %] 93 % (12/22 1157) Weight:  [68.9 kg-71.9 kg] 71.9 kg (12/21 2340)  Weight change:  Filed Weights   09/11/21 0818 09/11/21 1825 09/11/21 2340  Weight: 66.7 kg 68.9 kg 71.9 kg    Intake/Output: I/O last 3 completed shifts: In: 589.5 [P.O.:360; I.V.:229.5] Out: 400 [Urine:400]   Intake/Output this shift:  Total I/O In: 10626 [P.O.:240; Other:13200] Out: 12957 [Other:12957]  Physical Exam: General: NAD, sitting up in bed  Head: Normocephalic, atraumatic. Moist oral mucosal membranes  Eyes: Anicteric  Lungs:  Clear to auscultation, normal effort  Heart: Regular rate and rhythm  Abdomen:  Soft, nontender,   Extremities:  No peripheral edema.  Neurologic: Nonfocal, moving all four extremities  Skin: No lesions  Access: PD catheter    Basic Metabolic Panel: Recent Labs  Lab 09/11/21 0818 09/12/21 1023  NA 138 136  K 4.2 4.2  CL 104 104  CO2 23 22  GLUCOSE 88 95  BUN 61* 62*  CREATININE 7.16* 7.56*  CALCIUM 7.5* 7.9*      Liver Function Tests: Recent Labs  Lab 09/11/21 0818  AST 14*  ALT 17  ALKPHOS 45  BILITOT 1.0  PROT 5.8*  ALBUMIN 3.1*    Recent Labs  Lab 09/11/21 0818  LIPASE 27    No results for input(s): AMMONIA in the last 168 hours.  CBC: Recent Labs  Lab 09/11/21 0818 09/12/21 1023  WBC 4.3 3.3*  HGB 9.5* 8.1*  HCT 30.1* 25.1*  MCV 91.5 90.0  PLT 125* 101*     Cardiac Enzymes: No results for input(s): CKTOTAL, CKMB, CKMBINDEX, TROPONINI in the last 168 hours.  BNP: Invalid input(s): POCBNP  CBG: Recent Labs  Lab 09/11/21 2117 09/12/21 0727  GLUCAP 99 123*    Microbiology: Results for orders placed or performed during the hospital encounter of 09/11/21  Resp Panel by RT-PCR (Flu A&B, Covid) Nasopharyngeal Swab     Status: None   Collection Time: 09/11/21 10:53 AM   Specimen: Nasopharyngeal Swab; Nasopharyngeal(NP) swabs in vial transport medium  Result Value Ref Range Status   SARS Coronavirus 2 by RT PCR NEGATIVE NEGATIVE Final    Comment: (NOTE) SARS-CoV-2 target nucleic acids are NOT DETECTED.  The SARS-CoV-2 RNA is generally detectable in upper respiratory specimens during the acute phase of infection. The lowest concentration of SARS-CoV-2 viral copies this assay can detect is 138 copies/mL. A negative result does not preclude SARS-Cov-2 infection and should not be used as the sole basis for treatment or other patient management decisions. A negative result may occur with  improper specimen collection/handling, submission of specimen other than nasopharyngeal swab, presence of viral mutation(s) within the areas targeted by this assay, and inadequate number of viral copies(<138 copies/mL). A negative result must be combined with clinical observations, patient history, and epidemiological information. The expected result is Negative.  Fact Sheet for Patients:  EntrepreneurPulse.com.au  Fact Sheet for Healthcare Providers:   IncredibleEmployment.be  This test is no t yet approved or cleared by the [place] FDA and  has been authorized for detection and/or diagnosis of SARS-CoV-2 by FDA under an Emergency Use Authorization (EUA). This EUA will remain  in effect (meaning this test can be used) for the duration of the COVID-19 declaration under Section 564(b)(1) of the Act, 21 U.S.C.section 360bbb-3(b)(1), unless the authorization is terminated  or revoked sooner.       Influenza A by PCR NEGATIVE NEGATIVE Final   Influenza B by PCR NEGATIVE NEGATIVE Final    Comment: (NOTE) The Xpert Xpress SARS-CoV-2/FLU/RSV plus assay is intended as an aid in the diagnosis of influenza from Nasopharyngeal swab specimens and should not be used as a sole basis for treatment. Nasal washings and aspirates are unacceptable for Xpert Xpress SARS-CoV-2/FLU/RSV testing.  Fact Sheet for Patients: EntrepreneurPulse.com.au  Fact Sheet for Healthcare Providers: IncredibleEmployment.be  This test is not yet approved or cleared by the [place] FDA and has been authorized for detection and/or diagnosis of SARS-CoV-2 by FDA under an Emergency Use Authorization (EUA). This EUA will remain in effect (meaning this test can be used) for the duration of the COVID-19 declaration under Section 564(b)(1) of the Act, 21 U.S.C. section 360bbb-3(b)(1), unless the authorization is terminated or revoked.  Performed at [facility], [address] [place], [place] [address]     Coagulation Studies: Recent Labs    09/11/21 0818  LABPROT 14.4  INR 1.1     Urinalysis: No results for input(s): COLORURINE, LABSPEC, PHURINE, GLUCOSEU, HGBUR, BILIRUBINUR, KETONESUR, PROTEINUR, UROBILINOGEN, NITRITE, LEUKOCYTESUR in the last 72 hours.  Invalid input(s): APPERANCEUR    Imaging: DG Chest 2 View  Result Date: 09/11/2021 CLINICAL DATA:  Chest pain 02/27/2021  EXAM: CHEST - 2 VIEW COMPARISON:  02/27/2021 FINDINGS: Mild hyperinflation. Scarring in the lung bases. No acute confluent opacities. Heart is normal size. No effusions or acute bony abnormality. IMPRESSION: Mild hyperinflation/chronic changes.  No active disease. Electronically Signed   By: Rolm Baptise M.D.   On: 09/11/2021 08:40   US Venous Img Lower Bilateral (DVT)  Result Date: 09/11/2021 CLINICAL DATA:  Unable to obtain CT.  Rule out DVT. EXAM: BILATERAL LOWER EXTREMITY VENOUS DOPPLER ULTRASOUND TECHNIQUE: Gray-scale sonography with graded compression, as well as color Doppler and duplex ultrasound were performed to evaluate the lower extremity deep venous systems from the level of the common femoral vein and including the common femoral, femoral, profunda femoral, popliteal and calf veins including the posterior tibial, peroneal and gastrocnemius veins when visible. The superficial great saphenous vein was also interrogated. Spectral Doppler was utilized to evaluate flow at rest and with distal augmentation maneuvers in the common femoral, femoral and popliteal veins. COMPARISON:  Chest XR, 09/11/2021. FINDINGS: RIGHT LOWER EXTREMITY VENOUS Normal compressibility of the RIGHT common femoral, superficial femoral, and popliteal veins, as well as the visualized calf veins. Visualized portions of profunda femoral vein and great saphenous vein unremarkable. No filling defects to suggest DVT on grayscale or color Doppler imaging. Doppler waveforms show normal direction of venous flow, normal respiratory plasticity and response to augmentation. OTHER No  evidence of superficial thrombophlebitis or abnormal fluid collection. Limitations: none LEFT LOWER EXTREMITY VENOUS Normal compressibility of the LEFT common femoral, superficial femoral, and popliteal veins, as well as the visualized calf veins. Visualized portions of profunda femoral vein and great saphenous vein unremarkable. No filling defects to suggest  DVT on grayscale or color Doppler imaging. Doppler waveforms show normal direction of venous flow, normal respiratory plasticity and response to augmentation. OTHER None. Limitations: none IMPRESSION: No evidence of femoropopliteal DVT within either lower extremity. Michaelle Birks, MD [facility] Electronically Signed   By: Michaelle Birks M.D.   On: 09/11/2021 12:05     Medications:    sodium chloride 1 mL/kg/hr (09/12/21 0943)   [MAR Hold] dialysis solution 1.5% low-MG/low-CA     heparin 1,200 Units/hr (09/12/21 1218)    [MAR Hold] aspirin  81 mg Oral Daily   [START ON 09/13/2021] aspirin  81 mg Oral Pre-Cath   [MAR Hold] atorvastatin  40 mg Oral Daily   [MAR Hold] carvedilol  25 mg Oral BID WC   [MAR Hold] cholecalciferol  1,000 Units Oral Daily   [MAR Hold] citalopram  40 mg Oral Daily   [MAR Hold] ferrous sulfate  325 mg Oral Q breakfast   [MAR Hold] furosemide  20 mg Oral Daily   [MAR Hold] gentamicin cream  1 application Topical Daily   [MAR Hold] levETIRAcetam  500 mg Oral QHS   [MAR Hold] nicotine  14 mg Transdermal Daily   [MAR Hold] nortriptyline  25 mg Oral QHS   [MAR Hold] pantoprazole  20 mg Oral Daily   [MAR Hold] tamsulosin  0.4 mg Oral Daily   [MAR Hold] acetaminophen, [MAR Hold] albuterol, [MAR Hold] heparin, [MAR Hold] nitroGLYCERIN, [MAR Hold] ondansetron (ZOFRAN) IV, [MAR Hold] polyethylene glycol  Assessment/ Plan:  [name] is a 65 y.o.  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  Patient presents to the emergency department with complaints of chest pain and discomfort.  Patient will be admitted for Leg swelling [M79.89] NSTEMI (non-ST elevated myocardial infarction) ([place]) [I21.4]  [facility] -PD 2268ml fills/5 cycles/no last fill/1020  End-stage renal disease on peritoneal dialysis.   Will maintain nightly treatments during this admission if possible.   -Received dialysis last night, with no complications -Will continue nightly treatments during this admission  2. Anemia of chronic kidney disease Lab Results  Component Value Date   HGB 8.1 (L) 09/12/2021    EPO outpatient subcu every 2 weeks outpatient.   3. Secondary Hyperparathyroidism:   Lab Results  Component Value Date   CALCIUM 7.9 (L) 09/12/2021   CAION 1.02 (L) 11/19/2020   PHOS 7.8 (H) 11/22/2020    Phosphorus outside of desired target. Will consider to monitor and consider binders.     LOS: 1 Ndea Kilroy 12/22/20222:28 PM

## 2021-09-12 NOTE — Progress Notes (Signed)
PROGRESS NOTE    James Arnold  NIO:[id number] DOB: 1956/07/29 DOA: 09/11/2021 PCP: Valerie Roys, DO  [id number]   Assessment & Plan:   Principal Problem:   NSTEMI (non-ST elevated myocardial infarction) ([place]) Active Problems:   CAD (coronary artery disease)   Benign hypertensive renal disease   Tobacco abuse   BPH (benign prostatic hyperplasia)   Focal seizure ([place])   ESRD on peritoneal dialysis ([place])   COPD (chronic obstructive pulmonary disease) ([place])   James Arnold is a 65 y.o. male with medical history significant for end-stage renal disease on peritoneal dialysis, nicotine dependence, coronary artery disease status post PCI, depression, COPD who presents to the ER for evaluation of sudden onset shortness of breath. Patient states that he woke up to use the bathroom and while walking back to his bed he became acutely short of breath.  He tried to rest with no relief of his symptoms and then he developed midsternal chest pain associated with palpitations and an episode of nausea.   Non-ST elevation MI history of coronary artery disease status post PCI with stent  --trop 284 and then up to 295.  Started on heparin gtt. Plan: --cont heparin gtt Continue aspirin, statins and beta-blockers --left heart cath today  Nicotine dependence Smoking cessation has been discussed with patient in detail  --cont nicotine patch   End-stage renal disease on peritoneal dialysis --iPD per nephro   History of focal seizures --cont home Keppra   Depression Stable Continue Celexa and nortriptyline   History of BPH Stable Continue Flomax   DVT prophylaxis: GH:[id number] gtt Code Status: Full code  Family Communication: wife updated at bedside today  Level of care: Progressive Dispo:   The patient is from: home Anticipated d/c is to: home Anticipated d/c date is: 1-2 days Patient currently is not medically ready to d/c due to: pending heart cath   Subjective and  Interval History:  Overnight, Pt had chest pain and dyspnea shortly after initiation of PD, resolved with SL Nitro and albuterol Neb.  Going for left heart cath today.   Objective: Vitals:   09/13/21 0753 09/13/21 0832 09/13/21 1126 09/13/21 1355  BP: 119/68 (!) 121/57 (!) 102/59 103/63  Pulse: 97  82 84  Resp: 16 (!) 33 19 20  Temp: 98 F (36.7 C) 98.5 F (36.9 C) 97.8 F (36.6 C) 97.8 F (36.6 C)  TempSrc:  Oral  Oral  SpO2: 97% 100% 98% 99%  Weight:      Height:       No intake or output data in the 24 hours ending 09/20/21 1641 Filed Weights   09/11/21 1825 09/11/21 2340 09/13/21 0403  Weight: 68.9 kg 71.9 kg 70.5 kg    Examination:   Constitutional: NAD, AAOx3 HEENT: conjunctivae and lids normal, EOMI CV: No cyanosis.   RESP: normal respiratory effort Extremities: No effusions, edema in BLE SKIN: warm, dry Neuro: II - XII grossly intact.     Data Reviewed: I have personally reviewed following labs and imaging studies  CBC: No results for input(s): WBC, NEUTROABS, HGB, HCT, MCV, PLT in the last 168 hours. Basic Metabolic Panel: No results for input(s): NA, K, CL, CO2, GLUCOSE, BUN, CREATININE, CALCIUM, MG, PHOS in the last 168 hours. GFR: Estimated Creatinine Clearance: 8.8 mL/min (A) (by C-G formula based on SCr of 7.83 mg/dL (H)). Liver Function Tests: No results for input(s): AST, ALT, ALKPHOS, BILITOT, PROT, ALBUMIN in the last 168 hours. No results for input(s): LIPASE, AMYLASE  in the last 168 hours. No results for input(s): AMMONIA in the last 168 hours. Coagulation Profile: No results for input(s): INR, PROTIME in the last 168 hours. Cardiac Enzymes: No results for input(s): CKTOTAL, CKMB, CKMBINDEX, TROPONINI in the last 168 hours. BNP (last 3 results) No results for input(s): PROBNP in the last 8760 hours. HbA1C: No results for input(s): HGBA1C in the last 72 hours. CBG: No results for input(s): GLUCAP in the last 168 hours. Lipid Profile: No  results for input(s): CHOL, HDL, LDLCALC, TRIG, CHOLHDL, LDLDIRECT in the last 72 hours. Thyroid Function Tests: No results for input(s): TSH, T4TOTAL, FREET4, T3FREE, THYROIDAB in the last 72 hours. Anemia Panel: No results for input(s): VITAMINB12, FOLATE, FERRITIN, TIBC, IRON, RETICCTPCT in the last 72 hours. Sepsis Labs: No results for input(s): PROCALCITON, LATICACIDVEN in the last 168 hours.  Recent Results (from the past 240 hour(s))  Resp Panel by RT-PCR (Flu A&B, Covid) Nasopharyngeal Swab     Status: None   Collection Time: 09/11/21 10:53 AM   Specimen: Nasopharyngeal Swab; Nasopharyngeal(NP) swabs in vial transport medium  Result Value Ref Range Status   SARS Coronavirus 2 by RT PCR NEGATIVE NEGATIVE Final    Comment: (NOTE) SARS-CoV-2 target nucleic acids are NOT DETECTED.  The SARS-CoV-2 RNA is generally detectable in upper respiratory specimens during the acute phase of infection. The lowest concentration of SARS-CoV-2 viral copies this assay can detect is 138 copies/mL. A negative result does not preclude SARS-Cov-2 infection and should not be used as the sole basis for treatment or other patient management decisions. A negative result may occur with  improper specimen collection/handling, submission of specimen other than nasopharyngeal swab, presence of viral mutation(s) within the areas targeted by this assay, and inadequate number of viral copies(<138 copies/mL). A negative result must be combined with clinical observations, patient history, and epidemiological information. The expected result is Negative.  Fact Sheet for Patients:  EntrepreneurPulse.com.au  Fact Sheet for Healthcare Providers:  IncredibleEmployment.be  This test is no t yet approved or cleared by the [place] FDA and  has been authorized for detection and/or diagnosis of SARS-CoV-2 by FDA under an Emergency Use Authorization (EUA). This EUA will remain   in effect (meaning this test can be used) for the duration of the COVID-19 declaration under Section 564(b)(1) of the Act, 21 U.S.C.section 360bbb-3(b)(1), unless the authorization is terminated  or revoked sooner.       Influenza A by PCR NEGATIVE NEGATIVE Final   Influenza B by PCR NEGATIVE NEGATIVE Final    Comment: (NOTE) The Xpert Xpress SARS-CoV-2/FLU/RSV plus assay is intended as an aid in the diagnosis of influenza from Nasopharyngeal swab specimens and should not be used as a sole basis for treatment. Nasal washings and aspirates are unacceptable for Xpert Xpress SARS-CoV-2/FLU/RSV testing.  Fact Sheet for Patients: EntrepreneurPulse.com.au  Fact Sheet for Healthcare Providers: IncredibleEmployment.be  This test is not yet approved or cleared by the [place] FDA and has been authorized for detection and/or diagnosis of SARS-CoV-2 by FDA under an Emergency Use Authorization (EUA). This EUA will remain in effect (meaning this test can be used) for the duration of the COVID-19 declaration under Section 564(b)(1) of the Act, 21 U.S.C. section 360bbb-3(b)(1), unless the authorization is terminated or revoked.  Performed at [facility], [address], [place], [place] [address]       Radiology Studies: No results found.   Scheduled Meds: Continuous Infusions:   LOS: 2 days  Enzo Bi, MD [facility] If 7PM-7AM, please contact night-coverage 09/20/2021, 4:41 PM

## 2021-09-12 NOTE — Interval H&P Note (Signed)
Cath Lab Visit (complete for each Cath Lab visit)  Clinical Evaluation Leading to the Procedure:   ACS: Yes.     Non-ACS:  n/a    History and Physical Interval Note:  09/12/2021 3:43 PM  James Arnold  has presented today for surgery, with the diagnosis of unstable angina, NSTEMI.  The various methods of treatment have been discussed with the patient and family. After consideration of risks, benefits and other options for treatment, the patient has consented to  Procedure(s): LEFT HEART CATH AND CORONARY ANGIOGRAPHY (N/A) as a surgical intervention.  The patient's history has been reviewed, patient examined, no change in status, stable for surgery.  I have reviewed the patient's chart and labs.  Questions were answered to the patient's satisfaction.     Kathlyn Sacramento

## 2021-09-12 NOTE — Progress Notes (Signed)
Patient tolerates treatment, offers no concerns. Catheter insertion site without s/sx of infection, cleaned and capped.

## 2021-09-12 NOTE — Consult Note (Signed)
ANTICOAGULATION CONSULT NOTE  Pharmacy Consult for Heparin drip Indication: chest pain/ACS  Patient Measurements: Height: 5\' 7"  (170.2 cm) Weight: 71.9 kg (158 lb 8.2 oz) IBW/kg (Calculated) : 66.1 Heparin Dosing Weight: 66.7 kg  Labs: Recent Labs    09/11/21 0818 09/11/21 1025 09/11/21 1430 09/11/21 1629 09/11/21 1938 09/12/21 1023  HGB 9.5*  --   --   --   --  8.1*  HCT 30.1*  --   --   --   --  25.1*  PLT 125*  --   --   --   --  101*  APTT 30  --   --   --   --   --   LABPROT 14.4  --   --   --   --   --   INR 1.1  --   --   --   --   --   HEPARINUNFRC  --   --   --   --  <0.10* <0.10*  CREATININE 7.16*  --   --   --   --  7.56*  TROPONINIHS 284* 419* 495* 488*  --   --      Estimated Creatinine Clearance: 9.1 mL/min (A) (by C-G formula based on SCr of 7.56 mg/dL (H)).   Medical History: Past Medical History:  Diagnosis Date   Anemia    Benign hypertensive renal disease    CAD (coronary artery disease)    a. 08/1996 s/p BMS to the mLAD ([facility]); b. 12/2017 MV: EF 46%, fixed apical defect w/ significant GI uptake artifact. No ischemia-> low risk; c. 07/2020 MV: EF 51%, no ischemia/infarct.   Carotid arterial disease ([place])    a. 11/2020 Carotid U/S: <50% bilat ICA stenoses.   CKD (chronic kidney disease), stage V ([place])    on peritoneal dialysis   Complication of anesthesia    please call by "[name]" when waking up!!   COPD (chronic obstructive pulmonary disease) ([facility])    centrilobular emphysema. pt is poorly controlled with his copd/smoking.   Depression    Diastolic dysfunction    a. 12/2017 Echo: EF 60-65%, no rwma, Gr1 DD, mild AI/MR. Nl RVSP; b. 08/2020 Echo: EF 60-65%, no rwma, GrI DD, nl RV fxn. RVSP 32.37mmHg.   Dyspnea on exertion    with exertion   Fatigue    FSGS (focal segmental glomerulosclerosis)    Hyperlipidemia 10/2002   Hypertension    myelofibrosis    bone marrow failure   Myelofibrosis ([place]) 2010   JAK2 (+) primary   Myocardial infarction  ([place]) 1997   stent x 1   Pneumoperitoneum 02/2021   PSVT (paroxysmal supraventricular tachycardia) ([place])    a. 12/2020 Zio: Sinus rhythm 80 (55-117). Rare PACs/PVCs. 17 episodes of SVT (longest 20.3 secs, fastest 184). No triggered events. No afib.   Smoker    Stroke [facility])    a. 11/2020 MRI Brain: patchy acute/early subacute cortical infarcts w/in the R frontal, parietal, and occiptal lobes. Small, chronic L basal ganglia lacunar infarct.   Thrombocytopenia ([place])    Assessment: Patient admitted with chest pain and SOB. All baseline labs already drawn. Troponin elevated and trending upward. Pharmacy consulted to manage heparin infusion for ACS/STEMI. NO DOAC or warfarin PTA.  Noted ESRD - on peritoneal dialysis  12/22 10:23 HL < 0.1  Goal of Therapy:  Heparin level 0.3-0.7 units/ml Monitor platelets by anticoagulation protocol: Yes   Plan:  Patient s/p cath with heavily calcified three-vessel CAD and cardiomyopathy,  recommendation for CABG. Per consult, resume heparin at 2200.  --Order for heparin to resume at 2200 at previous rate 1200 units/hr --HL 12/23 at 0600 --CBC daily while on heparin  Tawnya Crook, PharmD, BCPS Clinical Pharmacist 09/12/2021 5:42 PM

## 2021-09-12 NOTE — Progress Notes (Signed)
Progress Note  Patient Name: James Arnold Date of Encounter: 09/12/2021  [facility] Cardiologist: Nelva Bush, MD   Subjective   Reports having chest pain overnight Around midnight during peritoneal dialysis, again had recurrent chest pain with shortness of breath, received sublingual nitroglycerin, albuterol nebulizer Eventually symptoms resolved Reports he did not sleep that well, very tired this morning  We were called back to the room later in the morning when his wife arrived to discuss recent events  Inpatient Medications    Scheduled Meds:  [MAR Hold] aspirin  81 mg Oral Daily   [START ON 09/13/2021] aspirin  81 mg Oral Pre-Cath   [MAR Hold] atorvastatin  40 mg Oral Daily   [MAR Hold] carvedilol  25 mg Oral BID WC   [MAR Hold] cholecalciferol  1,000 Units Oral Daily   [MAR Hold] citalopram  40 mg Oral Daily   [MAR Hold] ferrous sulfate  325 mg Oral Q breakfast   [MAR Hold] furosemide  20 mg Oral Daily   [MAR Hold] gentamicin cream  1 application Topical Daily   [MAR Hold] levETIRAcetam  500 mg Oral QHS   [MAR Hold] nicotine  14 mg Transdermal Daily   [MAR Hold] nortriptyline  25 mg Oral QHS   [MAR Hold] pantoprazole  20 mg Oral Daily   [MAR Hold] tamsulosin  0.4 mg Oral Daily   Continuous Infusions:  sodium chloride 1 mL/kg/hr (09/12/21 0943)   [MAR Hold] dialysis solution 1.5% low-MG/low-CA     heparin Stopped (09/12/21 1430)   PRN Meds: [MAR Hold] acetaminophen, [MAR Hold] albuterol, fentaNYL, [MAR Hold] heparin, midazolam, [MAR Hold] nitroGLYCERIN, [MAR Hold] ondansetron (ZOFRAN) IV, [MAR Hold] polyethylene glycol   Vital Signs    Vitals:   09/12/21 0435 09/12/21 0728 09/12/21 1157 09/12/21 1425  BP: 133/90 132/70 (!) 101/57 (!) 115/59  Pulse: (!) 110 (!) 104 94 94  Resp: 20 20 18 18   Temp: 98.4 F (36.9 C) 98.3 F (36.8 C) 97.8 F (36.6 C) 98.4 F (36.9 C)  TempSrc: Oral   Oral  SpO2: 95% 98% 93% 96%  Weight:      Height:         Intake/Output Summary (Last 24 hours) at 09/12/2021 1548 Last data filed at 09/12/2021 1000 Gross per 24 hour  Intake 14029.5 ml  Output 13357 ml  Net 672.5 ml   Last 3 Weights 09/11/2021 09/11/2021 09/11/2021  Weight (lbs) 158 lb 8.2 oz 151 lb 14.4 oz 147 lb  Weight (kg) 71.9 kg 68.901 kg 66.679 kg      Telemetry    Normal sinus rhythm- Personally Reviewed  ECG     - Personally Reviewed  Physical Exam   GEN: No acute distress.   Neck: No JVD Cardiac: RRR, no murmurs, rubs, or gallops.  Respiratory: Moderate decreased breath sounds throughout, scattered Rales GI: Soft, nontender, non-distended  MS: No edema; No deformity. Neuro:  Nonfocal  Psych: Normal affect   Labs    High Sensitivity Troponin:   Recent Labs  Lab 09/11/21 0818 09/11/21 1025 09/11/21 1430 09/11/21 1629  TROPONINIHS 284* 419* 495* 488*     Chemistry Recent Labs  Lab 09/11/21 0818 09/12/21 1023  NA 138 136  K 4.2 4.2  CL 104 104  CO2 23 22  GLUCOSE 88 95  BUN 61* 62*  CREATININE 7.16* 7.56*  CALCIUM 7.5* 7.9*  PROT 5.8*  --   ALBUMIN 3.1*  --   AST 14*  --   ALT 17  --  ALKPHOS 45  --   BILITOT 1.0  --   GFRNONAA 8* 7*  ANIONGAP 11 10    Lipids No results for input(s): CHOL, TRIG, HDL, LABVLDL, LDLCALC, CHOLHDL in the last 168 hours.  Hematology Recent Labs  Lab 09/11/21 0818 09/12/21 1023  WBC 4.3 3.3*  RBC 3.29* 2.79*  HGB 9.5* 8.1*  HCT 30.1* 25.1*  MCV 91.5 90.0  MCH 28.9 29.0  MCHC 31.6 32.3  RDW 15.8* 15.7*  PLT 125* 101*   Thyroid No results for input(s): TSH, FREET4 in the last 168 hours.  BNPNo results for input(s): BNP, PROBNP in the last 168 hours.  DDimer  Recent Labs  Lab 09/11/21 0818  DDIMER 1.03*     Radiology    DG Chest 2 View  Result Date: 09/11/2021 CLINICAL DATA:  Chest pain 02/27/2021 EXAM: CHEST - 2 VIEW COMPARISON:  02/27/2021 FINDINGS: Mild hyperinflation. Scarring in the lung bases. No acute confluent opacities. Heart is  normal size. No effusions or acute bony abnormality. IMPRESSION: Mild hyperinflation/chronic changes.  No active disease. Electronically Signed   By: Rolm Baptise M.D.   On: 09/11/2021 08:40   US Venous Img Lower Bilateral (DVT)  Result Date: 09/11/2021 CLINICAL DATA:  Unable to obtain CT.  Rule out DVT. EXAM: BILATERAL LOWER EXTREMITY VENOUS DOPPLER ULTRASOUND TECHNIQUE: Gray-scale sonography with graded compression, as well as color Doppler and duplex ultrasound were performed to evaluate the lower extremity deep venous systems from the level of the common femoral vein and including the common femoral, femoral, profunda femoral, popliteal and calf veins including the posterior tibial, peroneal and gastrocnemius veins when visible. The superficial great saphenous vein was also interrogated. Spectral Doppler was utilized to evaluate flow at rest and with distal augmentation maneuvers in the common femoral, femoral and popliteal veins. COMPARISON:  Chest XR, 09/11/2021. FINDINGS: RIGHT LOWER EXTREMITY VENOUS Normal compressibility of the RIGHT common femoral, superficial femoral, and popliteal veins, as well as the visualized calf veins. Visualized portions of profunda femoral vein and great saphenous vein unremarkable. No filling defects to suggest DVT on grayscale or color Doppler imaging. Doppler waveforms show normal direction of venous flow, normal respiratory plasticity and response to augmentation. OTHER No evidence of superficial thrombophlebitis or abnormal fluid collection. Limitations: none LEFT LOWER EXTREMITY VENOUS Normal compressibility of the LEFT common femoral, superficial femoral, and popliteal veins, as well as the visualized calf veins. Visualized portions of profunda femoral vein and great saphenous vein unremarkable. No filling defects to suggest DVT on grayscale or color Doppler imaging. Doppler waveforms show normal direction of venous flow, normal respiratory plasticity and response to  augmentation. OTHER None. Limitations: none IMPRESSION: No evidence of femoropopliteal DVT within either lower extremity. Michaelle Birks, MD [facility] Electronically Signed   By: Michaelle Birks M.D.   On: 09/11/2021 12:05    Cardiac Studies     Patient Profile     Mr. Grudzien is a 65 year old gentleman with history of coronary artery disease, hypertension, primary myelofibrosis, end-stage renal disease on peritoneal dialysis, COPD, anemia of chronic disease, presenting with non-STEMI  Assessment & Plan    Non-STEMI History of coronary disease with stent placed to LAD 1997 ("widow maker" is wife reports) Presenting to the hospital after developing chest pain, shortness of breath 3 AM 2 mornings ago Troponin close to 500 yesterday More pronounced ST and T wave depressions anterolaterally Long discussion with patient and patient's wife Strongly recommended ischemic work-up with cardiac catheterization  High risk of underlying disease I have reviewed the risks, indications, and alternatives to cardiac catheterization, possible angioplasty, and stenting with the patient. Risks include but are not limited to bleeding, infection, vascular injury, stroke, myocardial infection, arrhythmia, kidney injury, radiation-related injury in the case of prolonged fluoroscopy use, emergency cardiac surgery, and death. The patient understands the risks of serious complication is 1-2 in 8502 with diagnostic cardiac cath and 1-2% or less with angioplasty/stenting.  -- After further discussion, he is willing to proceed, wife agrees Will be scheduled for after 2 PM today depending on the schedule   End-stage renal disease on peritoneal dialysis Case discussed with nephrology, they have recommended proceed with catheterization with efforts to minimize contrast use -Discussed with patient that for severe disease, staged procedure might be needed    Smoker/COPD Reports he is still smoking 1/2 pack/day Smoking cessation recommended   Myelofibrosis Splenomegaly,  platelets 125, hemoglobin 9.5  COPD High risk of acute on chronic respiratory failure Will try to moderate IV fluid intake     Total encounter time more than 35 minutes  Greater than 50% was spent in counseling and coordination of care with the patient    For questions or updates, please contact [facility] Please consult [email or web address] for contact info under        Signed, Ida Rogue, MD  09/12/2021, 3:48 PM

## 2021-09-12 NOTE — Consult Note (Signed)
ANTICOAGULATION CONSULT NOTE  Pharmacy Consult for Heparin drip Indication: chest pain/ACS  Patient Measurements: Height: 5\' 7"  (170.2 cm) Weight: 71.9 kg (158 lb 8.2 oz) IBW/kg (Calculated) : 66.1 Heparin Dosing Weight: 66.7 kg  Labs: Recent Labs    09/11/21 0818 09/11/21 1025 09/11/21 1430 09/11/21 1629 09/11/21 1938 09/12/21 1023  HGB 9.5*  --   --   --   --  8.1*  HCT 30.1*  --   --   --   --  25.1*  PLT 125*  --   --   --   --  101*  APTT 30  --   --   --   --   --   LABPROT 14.4  --   --   --   --   --   INR 1.1  --   --   --   --   --   HEPARINUNFRC  --   --   --   --  <0.10* <0.10*  CREATININE 7.16*  --   --   --   --  7.56*  TROPONINIHS 284* 419* 495* 488*  --   --      Estimated Creatinine Clearance: 9.1 mL/min (A) (by C-G formula based on SCr of 7.56 mg/dL (H)).   Medical History: Past Medical History:  Diagnosis Date   Anemia    Benign hypertensive renal disease    CAD (coronary artery disease)    a. 08/1996 s/p BMS to the mLAD ([facility]); b. 12/2017 MV: EF 46%, fixed apical defect w/ significant GI uptake artifact. No ischemia-> low risk; c. 07/2020 MV: EF 51%, no ischemia/infarct.   Carotid arterial disease ([place])    a. 11/2020 Carotid U/S: <50% bilat ICA stenoses.   CKD (chronic kidney disease), stage V ([place])    on peritoneal dialysis   Complication of anesthesia    please call by "[name]" when waking up!!   COPD (chronic obstructive pulmonary disease) (HCC)    centrilobular emphysema. pt is poorly controlled with his copd/smoking.   Depression    Diastolic dysfunction    a. 12/2017 Echo: EF 60-65%, no rwma, Gr1 DD, mild AI/MR. Nl RVSP; b. 08/2020 Echo: EF 60-65%, no rwma, GrI DD, nl RV fxn. RVSP 32.38mmHg.   Dyspnea on exertion    with exertion   Fatigue    FSGS (focal segmental glomerulosclerosis)    Hyperlipidemia 10/2002   Hypertension    myelofibrosis    bone marrow failure   Myelofibrosis ([place]) 2010   JAK2 (+) primary   Myocardial infarction  ([place]) 1997   stent x 1   Pneumoperitoneum 02/2021   PSVT (paroxysmal supraventricular tachycardia) ([place])    a. 12/2020 [facility]io: Sinus rhythm 80 (55-117). Rare PACs/PVCs. 17 episodes of SVT (longest 20.3 secs, fastest 184). No triggered events. No afib.   Smoker    Stroke [facility])    a. 11/2020 MRI Brain: patchy acute/early subacute cortical infarcts w/in the R frontal, parietal, and occiptal lobes. Small, chronic L basal ganglia lacunar infarct.   Thrombocytopenia ([place])    Assessment: Patient admitted with chest pain and SOB. All baseline labs already drawn. Troponin elevated and trending upward. Pharmacy consulted to manage heparin infusion for ACS/STEMI. NO DOAC or warfarin PTA.  Noted ESRD - on peritoneal dialysis  12/22 10:23 HL < 0.1  Goal of Therapy:  Heparin level 0.3-0.7 units/ml Monitor platelets by anticoagulation protocol: Yes   Plan:  Heparin level is subtherapeutic. Will give heparin bolus of 2000  units and increase heparin infusion to 1200 units/hr. Recheck heparin level in 8 hours. CBC daily while on heparin. Plan for cath 12/22.    Oswald Hillock, PharmD, BCPS 09/12/2021 12:01 PM

## 2021-09-12 NOTE — Progress Notes (Signed)
@  [place], NP notified of the following:  @approx . 0025, 15-20 minutes after initiation of PD, pt had episode of recurrent chest pain and SHOB, ultimately relieved by 2 SL Nitro and an Albuterol Neb. EKG obtained and appears unchanged from admission EKG. Pt now resting comfortably.  No new orders received, will continue to monitor.

## 2021-09-12 NOTE — Discharge Summary (Addendum)
Physician Discharge Summary   James Arnold  male DOB: 01/21/1956  QIH:[id number]  PCP: Valerie Roys, DO  Admit date: 09/11/2021 Discharge date: 09/13/2021  Admitted From: home Disposition:  [facility] for CABG CODE STATUS: Full code   Hospital Course:  For full details, please see H&P, progress notes, consult notes and ancillary notes.  Briefly,  James Arnold is a 65 y.o. male with medical history significant for end-stage renal disease on peritoneal dialysis, nicotine dependence, coronary artery disease status post PCI, depression, COPD (not on home O2) who presented to the [facility] ER for evaluation of sudden onset shortness of breath.  Patient stated that he woke up to use the bathroom and while walking back to his bed he became acutely short of breath.  He tried to rest with no relief of his symptoms and then he developed midsternal chest pain associated with palpitations and an episode of nausea.  Non-ST elevation MI Severe 3-vessel CAD --trop peaked at 495.  Started on heparin gtt on presentation.   --Echo showed LVEF 35-40%, global hypokinesis, grade II diastolic dysfunction. --Underwent cardiac cath on 12/22 and found:   1st Mrg lesion is 50% stenosed.   Dist Cx lesion is 95% stenosed.   Prox Cx to Mid Cx lesion is 40% stenosed.   Prox LAD to Mid LAD lesion is 95% stenosed.   Prox LAD lesion is 60% stenosed.   Prox RCA lesion is 40% stenosed.   Mid RCA lesion is 100% stenosed.  Dr. Silvio Pate at [facility] accept pt for transfer for CABG.  --cont heparin gtt --cont aspirin, statins and coreg  Dyspnea Acute systolic congestive heart failure --Pt was put on 4L O2 for dyspnea prior to discharge.  Likely due to CHF, NSTEMI and also missed PD session night of 12/22 due to dialyzer malfunction.  Pt received IV Lasix 80 mg x1 prior to discharge.  Nicotine dependence Smoking cessation has been discussed with patient in detail --nicotine patch   End-stage renal disease on  peritoneal dialysis --nephrology consult for inpatient PD --pt has some residual renal function.  --missed PD session night of 12/22 due to dialyzer malfunction.  Will need to resume PD once arrived at [facility].   History of focal seizures Continue Keppra   Depression Stable Continue Celexa and nortriptyline   History of BPH Stable Continue Flomax   Discharge Diagnoses:  Principal Problem:   NSTEMI (non-ST elevated myocardial infarction) ([place]) Active Problems:   CAD (coronary artery disease)   Benign hypertensive renal disease   Tobacco abuse   BPH (benign prostatic hyperplasia)   Focal seizure ([place])   ESRD on peritoneal dialysis ([facility])   30 Day Unplanned Readmission Risk Score    Flowsheet Row ED to Hosp-Admission (Current) from 09/11/2021 in [facility]  30 Day Unplanned Readmission Risk Score (%) 28.77 Filed at 09/12/2021 1600       This score is the patient's risk of an unplanned readmission within 30 days of being discharged (0 -100%). The score is based on dignosis, age, lab data, medications, orders, and past utilization.   Low:  0-14.9   Medium: 15-21.9   High: 22-29.9   Extreme: 30 and above         Discharge Instructions:  Allergies as of 09/12/2021   No Known Allergies      Medication List     TAKE these medications    albuterol 108 (90 Base) MCG/ACT inhaler Commonly known as: VENTOLIN HFA Inhale  2 puffs into the lungs every 6 (six) hours as needed for wheezing or shortness of breath.   aspirin 81 MG chewable tablet Chew 1 tablet (81 mg total) by mouth daily.   atorvastatin 40 MG tablet Commonly known as: LIPITOR Take 1 tablet (40 mg total) by mouth daily. Start taking on: September 13, 2021   carvedilol 25 MG tablet Commonly known as: COREG Take 1 tablet (25 mg total) by mouth 2 (two) times daily.   citalopram 40 MG tablet Commonly known as: CELEXA Take 1.5 tablets (60 mg total) by mouth daily.   ferrous sulfate  325 (65 FE) MG tablet Take 1 tablet (325 mg total) by mouth daily with breakfast.   furosemide 20 MG tablet Commonly known as: LASIX Take 1 tablet (20 mg total) by mouth daily.   gentamicin cream 0.1 % Commonly known as: GARAMYCIN Apply 1 application topically every other day.   heparin 25000 UT/250ML infusion Inject 1,200 Units/hr into the vein continuous.   levETIRAcetam 500 MG tablet Commonly known as: KEPPRA Take 1 tablet (500 mg total) by mouth at bedtime.   nicotine 21 mg/24hr patch Commonly known as: NICODERM CQ - dosed in mg/24 hours Place 1 patch (21 mg total) onto the skin daily. Start taking on: September 13, 2021   nitroGLYCERIN 0.4 MG SL tablet Commonly known as: NITROSTAT Place 1 tablet (0.4 mg total) under the tongue every 5 (five) minutes as needed for chest pain.   nortriptyline 25 MG capsule Commonly known as: Pamelor Take 1 capsule (25 mg total) by mouth at bedtime.   ondansetron 4 MG disintegrating tablet Commonly known as: Zofran ODT Take 1 tablet (4 mg total) by mouth every 8 (eight) hours as needed for nausea or vomiting.   pantoprazole 20 MG tablet Commonly known as: PROTONIX Take 20 mg by mouth daily.   polyethylene glycol 17 g packet Commonly known as: MIRALAX / GLYCOLAX Take 17 g by mouth daily as needed.   predniSONE 10 MG tablet Commonly known as: DELTASONE Take 10 mg by mouth daily with breakfast.   tamsulosin 0.4 MG Caps capsule Commonly known as: FLOMAX Take 0.4 mg by mouth daily.   Vitamin D-1000 Max St 25 MCG (1000 UT) tablet Generic drug: Cholecalciferol Take 1,000 Units by mouth daily.          No Known Allergies   The results of significant diagnostics from this hospitalization (including imaging, microbiology, ancillary and laboratory) are listed below for reference.   Consultations:   Procedures/Studies: DG Chest 2 View  Result Date: 09/11/2021 CLINICAL DATA:  Chest pain 02/27/2021 EXAM: CHEST - 2 VIEW  COMPARISON:  02/27/2021 FINDINGS: Mild hyperinflation. Scarring in the lung bases. No acute confluent opacities. Heart is normal size. No effusions or acute bony abnormality. IMPRESSION: Mild hyperinflation/chronic changes.  No active disease. Electronically Signed   By: Rolm Baptise M.D.   On: 09/11/2021 08:40   CARDIAC CATHETERIZATION  Addendum Date: 09/12/2021     1st Mrg lesion is 50% stenosed.   Dist Cx lesion is 95% stenosed.   Prox Cx to Mid Cx lesion is 40% stenosed.   Prox LAD to Mid LAD lesion is 95% stenosed.   Prox LAD lesion is 60% stenosed.   Prox RCA lesion is 40% stenosed.   Mid RCA lesion is 100% stenosed. 1.  Left dominant coronary arteries with severe three-vessel coronary artery disease.  The coronary arteries are heavily calcified especially the LAD. 2.  Left ventricular angiography was not performed  to preserve contrast given residual renal function.  EF was moderately reduced by echo. 3.  Mildly elevated left ventricular end-diastolic pressure at 19 mmHg. Recommendations: Given heavily calcified three-vessel coronary artery disease and cardiomyopathy, best option is CABG if the patient is deemed to be a candidate considering his comorbidities.  Otherwise, high risk PCI to the LAD and left circumflex can be considered with atherectomy to the LAD. Recommend resuming heparin drip in 6 hours (at 2200)  Result Date: 09/12/2021   1st Mrg lesion is 50% stenosed.   Dist Cx lesion is 95% stenosed.   Prox Cx to Mid Cx lesion is 40% stenosed.   Prox LAD to Mid LAD lesion is 95% stenosed.   Prox LAD lesion is 60% stenosed.   Prox RCA lesion is 40% stenosed.   Mid RCA lesion is 100% stenosed. 1.  Left dominant coronary arteries with severe three-vessel coronary artery disease.  The coronary arteries are heavily calcified especially the LAD. 2.  Left ventricular angiography was not performed to preserve contrast given residual renal function.  EF was moderately reduced by echo. 3.  Mildly elevated  left ventricular end-diastolic pressure at 19 mmHg. Recommendations: Given heavily calcified three-vessel coronary artery disease and cardiomyopathy, best option is CABG if the patient is deemed to be a candidate considering his comorbidities.  Otherwise, high risk PCI to the LAD and left circumflex can be considered with atherectomy to the LAD.   US Venous Img Lower Bilateral (DVT)  Result Date: 09/11/2021 CLINICAL DATA:  Unable to obtain CT.  Rule out DVT. EXAM: BILATERAL LOWER EXTREMITY VENOUS DOPPLER ULTRASOUND TECHNIQUE: Gray-scale sonography with graded compression, as well as color Doppler and duplex ultrasound were performed to evaluate the lower extremity deep venous systems from the level of the common femoral vein and including the common femoral, femoral, profunda femoral, popliteal and calf veins including the posterior tibial, peroneal and gastrocnemius veins when visible. The superficial great saphenous vein was also interrogated. Spectral Doppler was utilized to evaluate flow at rest and with distal augmentation maneuvers in the common femoral, femoral and popliteal veins. COMPARISON:  Chest XR, 09/11/2021. FINDINGS: RIGHT LOWER EXTREMITY VENOUS Normal compressibility of the RIGHT common femoral, superficial femoral, and popliteal veins, as well as the visualized calf veins. Visualized portions of profunda femoral vein and great saphenous vein unremarkable. No filling defects to suggest DVT on grayscale or color Doppler imaging. Doppler waveforms show normal direction of venous flow, normal respiratory plasticity and response to augmentation. OTHER No evidence of superficial thrombophlebitis or abnormal fluid collection. Limitations: none LEFT LOWER EXTREMITY VENOUS Normal compressibility of the LEFT common femoral, superficial femoral, and popliteal veins, as well as the visualized calf veins. Visualized portions of profunda femoral vein and great saphenous vein unremarkable. No filling defects  to suggest DVT on grayscale or color Doppler imaging. Doppler waveforms show normal direction of venous flow, normal respiratory plasticity and response to augmentation. OTHER None. Limitations: none IMPRESSION: No evidence of femoropopliteal DVT within either lower extremity. Michaelle Birks, MD [facility] Electronically Signed   By: Michaelle Birks M.D.   On: 09/11/2021 12:05   ECHOCARDIOGRAM COMPLETE  Result Date: 09/12/2021    ECHOCARDIOGRAM REPORT   Patient Name:   KEES IDROVO Date of Exam: 09/12/2021 Medical Rec #:  [id number]     Height:       67.0 in Accession #:    [id number]    Weight:       158.5  lb Date of Birth:  06/10/1956      BSA:          1.832 m Patient Age:    56 years      BP:           132/70 mmHg Patient Gender: M             HR:           102 bpm. Exam Location:  [facility] Procedure: 2D Echo, Color Doppler and Cardiac Doppler Indications:     I21.4 NSTEMI  History:         Patient has prior history of Echocardiogram examinations, most                  recent 11/22/2020. CAD, CKD, stage V, COPD and Stroke; Risk                  Factors:Hypertension, Dyslipidemia and Current Smoker.  Sonographer:     Charmayne Sheer Referring Phys:  [place] Diagnosing Phys: Kathlyn [place] MD  Sonographer Comments: Global longitudinal strain was attempted. IMPRESSIONS  1. Left ventricular ejection fraction, by estimation, is 35 to 40%. The left ventricle has moderately decreased function. The left ventricle demonstrates global hypokinesis. The left ventricular internal cavity size was mildly dilated. Left ventricular diastolic parameters are consistent with Grade II diastolic dysfunction (pseudonormalization). The average left ventricular global longitudinal strain is -9.8 %. The global longitudinal strain is abnormal.  2. Right ventricular systolic function is normal. The right ventricular size is normal.  3. Left atrial size was mildly dilated.  4. The  mitral valve is normal in structure. Mild mitral valve regurgitation. No evidence of mitral stenosis.  5. The aortic valve is calcified. Aortic valve regurgitation is not visualized. Mild aortic valve stenosis. Aortic valve area, by VTI measures 1.73 cm. Aortic valve mean gradient measures 14.0 mmHg.  6. The inferior vena cava is dilated in size with <50% respiratory variability, suggesting right atrial pressure of 15 mmHg. FINDINGS  Left Ventricle: Left ventricular ejection fraction, by estimation, is 35 to 40%. The left ventricle has moderately decreased function. The left ventricle demonstrates global hypokinesis. The average left ventricular global longitudinal strain is -9.8 %.  The global longitudinal strain is abnormal. The left ventricular internal cavity size was mildly dilated. There is no left ventricular hypertrophy. Left ventricular diastolic parameters are consistent with Grade II diastolic dysfunction (pseudonormalization). Right Ventricle: The right ventricular size is normal. No increase in right ventricular wall thickness. Right ventricular systolic function is normal. Left Atrium: Left atrial size was mildly dilated. Right Atrium: Right atrial size was normal in size. Pericardium: There is no evidence of pericardial effusion. Mitral Valve: The mitral valve is normal in structure. Mild to moderate mitral annular calcification. Mild mitral valve regurgitation. No evidence of mitral valve stenosis. MV peak gradient, 7.6 mmHg. The mean mitral valve gradient is 5.0 mmHg. Tricuspid Valve: The tricuspid valve is normal in structure. Tricuspid valve regurgitation is not demonstrated. No evidence of tricuspid stenosis. Aortic Valve: The aortic valve is calcified. Aortic valve regurgitation is not visualized. Mild aortic stenosis is present. Aortic valve mean gradient measures 14.0 mmHg. Aortic valve peak gradient measures 23.5 mmHg. Aortic valve area, by VTI measures 1.73 cm. Pulmonic Valve: The pulmonic  valve was normal in structure. Pulmonic valve regurgitation is not visualized. No evidence of pulmonic stenosis. Aorta: The aortic root is normal in size and structure. Venous: The inferior vena cava is dilated  in size with less than 50% respiratory variability, suggesting right atrial pressure of 15 mmHg. IAS/Shunts: No atrial level shunt detected by color flow Doppler.  LEFT VENTRICLE PLAX 2D LVIDd:         5.50 cm   Diastology LVIDs:         3.96 cm   LV e' medial:    6.20 cm/s LV PW:         0.98 cm   LV E/e' medial:  22.4 LV IVS:        0.93 cm   LV e' lateral:   7.40 cm/s LVOT diam:     2.20 cm   LV E/e' lateral: 18.8 LV SV:         73 LV SV Index:   40        2D Longitudinal Strain LVOT Area:     3.80 cm  2D Strain GLS Avg:     -9.8 %  RIGHT VENTRICLE RV Basal diam:  3.23 cm RV S prime:     11.20 cm/s LEFT ATRIUM           Index        RIGHT ATRIUM           Index LA diam:      4.40 cm 2.40 cm/m   RA Area:     18.60 cm LA Vol (A2C): 86.9 ml 47.43 ml/m  RA Volume:   56.00 ml  30.57 ml/m LA Vol (A4C): 51.1 ml 27.89 ml/m  AORTIC VALVE                     PULMONIC VALVE AV Area (Vmax):    1.80 cm      PV Vmax:       1.08 m/s AV Area (Vmean):   1.70 cm      PV Vmean:      75.400 cm/s AV Area (VTI):     1.73 cm      PV VTI:        0.167 m AV Vmax:           242.50 cm/s   PV Peak grad:  4.7 mmHg AV Vmean:          177.000 cm/s  PV Mean grad:  2.0 mmHg AV VTI:            0.419 m AV Peak Grad:      23.5 mmHg AV Mean Grad:      14.0 mmHg LVOT Vmax:         115.00 cm/s LVOT Vmean:        79.100 cm/s LVOT VTI:          0.191 m LVOT/AV VTI ratio: 0.46  AORTA Ao Root diam: 3.10 cm MITRAL VALVE MV Area (PHT): 6.32 cm     SHUNTS MV Area VTI:   2.49 cm     Systemic VTI:  0.19 m MV Peak grad:  7.6 mmHg     Systemic Diam: 2.20 cm MV Mean grad:  5.0 mmHg MV Vmax:       1.38 m/s MV Vmean:      108.0 cm/s MV Decel Time: 120 msec MV E velocity: 139.00 cm/s MV A velocity: 131.00 cm/s MV E/A ratio:  1.06 Kathlyn [place] MD  Electronically signed by Kathlyn Sacramento MD Signature Date/Time: 09/12/2021/4:56:25 PM    Final       Labs: BNP (last 3 results) No results for input(s): BNP in  the last 8760 hours. Basic Metabolic Panel: Recent Labs  Lab 09/11/21 0818 09/12/21 1023  NA 138 136  K 4.2 4.2  CL 104 104  CO2 23 22  GLUCOSE 88 95  BUN 61* 62*  CREATININE 7.16* 7.56*  CALCIUM 7.5* 7.9*   Liver Function Tests: Recent Labs  Lab 09/11/21 0818  AST 14*  ALT 17  ALKPHOS 45  BILITOT 1.0  PROT 5.8*  ALBUMIN 3.1*   Recent Labs  Lab 09/11/21 0818  LIPASE 27   No results for input(s): AMMONIA in the last 168 hours. CBC: Recent Labs  Lab 09/11/21 0818 09/12/21 1023  WBC 4.3 3.3*  HGB 9.5* 8.1*  HCT 30.1* 25.1*  MCV 91.5 90.0  PLT 125* 101*   Cardiac Enzymes: No results for input(s): CKTOTAL, CKMB, CKMBINDEX, TROPONINI in the last 168 hours. BNP: Invalid input(s): POCBNP CBG: Recent Labs  Lab 09/11/21 2117 09/12/21 0727  GLUCAP 99 123*   D-Dimer Recent Labs    09/11/21 0818  DDIMER 1.03*   Hgb A1c No results for input(s): HGBA1C in the last 72 hours. Lipid Profile No results for input(s): CHOL, HDL, LDLCALC, TRIG, CHOLHDL, LDLDIRECT in the last 72 hours. Thyroid function studies No results for input(s): TSH, T4TOTAL, T3FREE, THYROIDAB in the last 72 hours.  Invalid input(s): FREET3 Anemia work up No results for input(s): VITAMINB12, FOLATE, FERRITIN, TIBC, IRON, RETICCTPCT in the last 72 hours. Urinalysis    Component Value Date/Time   COLORURINE YELLOW (A) 03/01/2021 0719   APPEARANCEUR Clear 09/06/2021 1117   LABSPEC 1.009 03/01/2021 0719   PHURINE 6.0 03/01/2021 0719   GLUCOSEU Negative 09/06/2021 1117   HGBUR NEGATIVE 03/01/2021 0719   BILIRUBINUR Negative 09/06/2021 1117   KETONESUR NEGATIVE 03/01/2021 0719   PROTEINUR 2+ (A) 09/06/2021 1117   PROTEINUR 100 (A) 03/01/2021 0719   NITRITE Negative 09/06/2021 1117   NITRITE NEGATIVE 03/01/2021 0719    LEUKOCYTESUR Negative 09/06/2021 1117   LEUKOCYTESUR NEGATIVE 03/01/2021 0719   Sepsis Labs Invalid input(s): PROCALCITONIN,  WBC,  LACTICIDVEN Microbiology Recent Results (from the past 240 hour(s))  Microscopic Examination     Status: None   Collection Time: 09/06/21 11:17 AM   Urine  Result Value Ref Range Status   WBC, UA None seen 0 - 5 /hpf Final   RBC None seen 0 - 2 /hpf Final   Epithelial Cells (non renal) None seen 0 - 10 /hpf Final   Casts None seen None seen /lpf Final   Bacteria, UA None seen None seen/Few Final  Resp Panel by RT-PCR (Flu A&B, Covid) Nasopharyngeal Swab     Status: None   Collection Time: 09/11/21 10:53 AM   Specimen: Nasopharyngeal Swab; Nasopharyngeal(NP) swabs in vial transport medium  Result Value Ref Range Status   SARS Coronavirus 2 by RT PCR NEGATIVE NEGATIVE Final    Comment: (NOTE) SARS-CoV-2 target nucleic acids are NOT DETECTED.  The SARS-CoV-2 RNA is generally detectable in upper respiratory specimens during the acute phase of infection. The lowest concentration of SARS-CoV-2 viral copies this assay can detect is 138 copies/mL. A negative result does not preclude SARS-Cov-2 infection and should not be used as the sole basis for treatment or other patient management decisions. A negative result may occur with  improper specimen collection/handling, submission of specimen other than nasopharyngeal swab, presence of viral mutation(s) within the areas targeted by this assay, and inadequate number of viral copies(<138 copies/mL). A negative result must be combined with clinical observations, patient history, and  epidemiological information. The expected result is Negative.  Fact Sheet for Patients:  EntrepreneurPulse.com.au  Fact Sheet for Healthcare Providers:  IncredibleEmployment.be  This test is no t yet approved or cleared by the [place] FDA and  has been authorized for detection and/or  diagnosis of SARS-CoV-2 by FDA under an Emergency Use Authorization (EUA). This EUA will remain  in effect (meaning this test can be used) for the duration of the COVID-19 declaration under Section 564(b)(1) of the Act, 21 U.S.C.section 360bbb-3(b)(1), unless the authorization is terminated  or revoked sooner.       Influenza A by PCR NEGATIVE NEGATIVE Final   Influenza B by PCR NEGATIVE NEGATIVE Final    Comment: (NOTE) The Xpert Xpress SARS-CoV-2/FLU/RSV plus assay is intended as an aid in the diagnosis of influenza from Nasopharyngeal swab specimens and should not be used as a sole basis for treatment. Nasal washings and aspirates are unacceptable for Xpert Xpress SARS-CoV-2/FLU/RSV testing.  Fact Sheet for Patients: EntrepreneurPulse.com.au  Fact Sheet for Healthcare Providers: IncredibleEmployment.be  This test is not yet approved or cleared by the [place] FDA and has been authorized for detection and/or diagnosis of SARS-CoV-2 by FDA under an Emergency Use Authorization (EUA). This EUA will remain in effect (meaning this test can be used) for the duration of the COVID-19 declaration under Section 564(b)(1) of the Act, 21 U.S.C. section 360bbb-3(b)(1), unless the authorization is terminated or revoked.  Performed at [facility], [address] [place], [place] [address]      Total time spend on discharging this patient, including the last patient exam, discussing the hospital stay, instructions for ongoing care as it relates to all pertinent caregivers, as well as preparing the medical discharge records, prescriptions, and/or referrals as applicable, is 45 minutes.    Enzo Bi, MD  [facility] 09/12/2021, 7:38 PM

## 2021-09-12 NOTE — Progress Notes (Signed)
*  PRELIMINARY RESULTS* Echocardiogram 2D Echocardiogram has been performed.  James Arnold 09/12/2021, 9:05 AM

## 2021-09-12 NOTE — Progress Notes (Signed)
Patient remains clinically stable post heart cath, Dr Fletcher Anon out to speak with patient and wife with update given. For possible transfer to [facility] for Cabg,vitals remain stable. Report called to [facility] RN on 2A with plan reviewed,denies complaints at this time.

## 2021-09-13 ENCOUNTER — Other Ambulatory Visit: Payer: Self-pay

## 2021-09-13 ENCOUNTER — Encounter: Payer: Self-pay | Admitting: Cardiovascular Disease

## 2021-09-13 DIAGNOSIS — I120 Hypertensive chronic kidney disease with stage 5 chronic kidney disease or end stage renal disease: Secondary | ICD-10-CM | POA: Diagnosis not present

## 2021-09-13 DIAGNOSIS — N186 End stage renal disease: Secondary | ICD-10-CM | POA: Diagnosis not present

## 2021-09-13 DIAGNOSIS — I25110 Atherosclerotic heart disease of native coronary artery with unstable angina pectoris: Secondary | ICD-10-CM | POA: Diagnosis not present

## 2021-09-13 DIAGNOSIS — T82855A Stenosis of coronary artery stent, initial encounter: Secondary | ICD-10-CM | POA: Diagnosis not present

## 2021-09-13 DIAGNOSIS — I214 Non-ST elevation (NSTEMI) myocardial infarction: Secondary | ICD-10-CM | POA: Diagnosis not present

## 2021-09-13 DIAGNOSIS — J9811 Atelectasis: Secondary | ICD-10-CM | POA: Diagnosis not present

## 2021-09-13 DIAGNOSIS — I129 Hypertensive chronic kidney disease with stage 1 through stage 4 chronic kidney disease, or unspecified chronic kidney disease: Secondary | ICD-10-CM | POA: Diagnosis not present

## 2021-09-13 DIAGNOSIS — J811 Chronic pulmonary edema: Secondary | ICD-10-CM | POA: Diagnosis not present

## 2021-09-13 DIAGNOSIS — D696 Thrombocytopenia, unspecified: Secondary | ICD-10-CM | POA: Diagnosis not present

## 2021-09-13 DIAGNOSIS — Z01818 Encounter for other preprocedural examination: Secondary | ICD-10-CM | POA: Diagnosis not present

## 2021-09-13 DIAGNOSIS — D61818 Other pancytopenia: Secondary | ICD-10-CM | POA: Diagnosis not present

## 2021-09-13 DIAGNOSIS — E785 Hyperlipidemia, unspecified: Secondary | ICD-10-CM | POA: Diagnosis not present

## 2021-09-13 DIAGNOSIS — J432 Centrilobular emphysema: Secondary | ICD-10-CM | POA: Diagnosis not present

## 2021-09-13 DIAGNOSIS — Z955 Presence of coronary angioplasty implant and graft: Secondary | ICD-10-CM | POA: Diagnosis not present

## 2021-09-13 DIAGNOSIS — I2584 Coronary atherosclerosis due to calcified coronary lesion: Secondary | ICD-10-CM | POA: Diagnosis not present

## 2021-09-13 DIAGNOSIS — I2510 Atherosclerotic heart disease of native coronary artery without angina pectoris: Secondary | ICD-10-CM | POA: Diagnosis not present

## 2021-09-13 DIAGNOSIS — D471 Chronic myeloproliferative disease: Secondary | ICD-10-CM | POA: Diagnosis not present

## 2021-09-13 DIAGNOSIS — Z992 Dependence on renal dialysis: Secondary | ICD-10-CM | POA: Diagnosis not present

## 2021-09-13 DIAGNOSIS — N289 Disorder of kidney and ureter, unspecified: Secondary | ICD-10-CM | POA: Diagnosis not present

## 2021-09-13 DIAGNOSIS — F17200 Nicotine dependence, unspecified, uncomplicated: Secondary | ICD-10-CM | POA: Diagnosis not present

## 2021-09-13 DIAGNOSIS — J449 Chronic obstructive pulmonary disease, unspecified: Secondary | ICD-10-CM

## 2021-09-13 DIAGNOSIS — R079 Chest pain, unspecified: Secondary | ICD-10-CM | POA: Diagnosis not present

## 2021-09-13 DIAGNOSIS — I132 Hypertensive heart and chronic kidney disease with heart failure and with stage 5 chronic kidney disease, or end stage renal disease: Secondary | ICD-10-CM | POA: Diagnosis not present

## 2021-09-13 DIAGNOSIS — D692 Other nonthrombocytopenic purpura: Secondary | ICD-10-CM | POA: Diagnosis not present

## 2021-09-13 DIAGNOSIS — I700 Atherosclerosis of aorta: Secondary | ICD-10-CM | POA: Diagnosis not present

## 2021-09-13 DIAGNOSIS — R339 Retention of urine, unspecified: Secondary | ICD-10-CM | POA: Diagnosis not present

## 2021-09-13 DIAGNOSIS — N2581 Secondary hyperparathyroidism of renal origin: Secondary | ICD-10-CM | POA: Diagnosis not present

## 2021-09-13 DIAGNOSIS — J90 Pleural effusion, not elsewhere classified: Secondary | ICD-10-CM | POA: Diagnosis not present

## 2021-09-13 DIAGNOSIS — Z720 Tobacco use: Secondary | ICD-10-CM | POA: Diagnosis not present

## 2021-09-13 DIAGNOSIS — D631 Anemia in chronic kidney disease: Secondary | ICD-10-CM | POA: Diagnosis not present

## 2021-09-13 LAB — BASIC METABOLIC PANEL
Anion gap: 8 (ref 5–15)
BUN: 67 mg/dL — ABNORMAL HIGH (ref 8–23)
CO2: 22 mmol/L (ref 22–32)
Calcium: 8 mg/dL — ABNORMAL LOW (ref 8.9–10.3)
Chloride: 103 mmol/L (ref 98–111)
Creatinine, Ser: 7.83 mg/dL — ABNORMAL HIGH (ref 0.61–1.24)
GFR, Estimated: 7 mL/min — ABNORMAL LOW (ref >60)
Glucose, Bld: 99 mg/dL (ref 70–99)
Potassium: 4.3 mmol/L (ref 3.5–5.1)
Sodium: 133 mmol/L — ABNORMAL LOW (ref 135–145)

## 2021-09-13 LAB — HEPARIN LEVEL (UNFRACTIONATED): Heparin Unfractionated: <0.1 IU/mL — ABNORMAL LOW (ref 0.30–0.70)

## 2021-09-13 LAB — CBC
HCT: 25.3 % — ABNORMAL LOW (ref 39.0–52.0)
Hemoglobin: 8.1 g/dL — ABNORMAL LOW (ref 13.0–17.0)
MCH: 28.9 pg (ref 26.0–34.0)
MCHC: 32 g/dL (ref 30.0–36.0)
MCV: 90.4 fL (ref 80.0–100.0)
Platelets: 113 10*3/uL — ABNORMAL LOW (ref 150–400)
RBC: 2.8 MIL/uL — ABNORMAL LOW (ref 4.22–5.81)
RDW: 15.9 % — ABNORMAL HIGH (ref 11.5–15.5)
WBC: 3.2 10*3/uL — ABNORMAL LOW (ref 4.0–10.5)
nRBC: 0 % (ref 0.0–0.2)

## 2021-09-13 LAB — MAGNESIUM: Magnesium: 1.6 mg/dL — ABNORMAL LOW (ref 1.7–2.4)

## 2021-09-13 MED ORDER — MORPHINE SULFATE (PF) 2 MG/ML IV SOLN
1.0000 mg | Freq: Once | INTRAVENOUS | Status: AC
  Administered 2021-09-13: 07:00:00 1 mg via INTRAVENOUS
  Filled 2021-09-13: qty 1

## 2021-09-13 MED ORDER — FUROSEMIDE 10 MG/ML IJ SOLN
80.0000 mg | Freq: Once | INTRAMUSCULAR | Status: AC
Start: 2021-09-13 — End: 2021-09-13
  Administered 2021-09-13: 10:00:00 80 mg via INTRAVENOUS
  Filled 2021-09-13: qty 8

## 2021-09-13 MED ORDER — HEPARIN BOLUS VIA INFUSION
4000.0000 [IU] | Freq: Once | INTRAVENOUS | Status: AC
  Administered 2021-09-13: 09:00:00 4000 [IU] via INTRAVENOUS
  Filled 2021-09-13: qty 4000

## 2021-09-13 NOTE — Plan of Care (Signed)
  Problem: Pain Managment: Goal: General experience of comfort will improve Outcome: Progressing   Problem: Safety: Goal: Ability to remain free from injury will improve Outcome: Progressing   Problem: Skin Integrity: Goal: Risk for impaired skin integrity will decrease Outcome: Progressing   Problem: Education: Goal: Knowledge of General Education information will improve Description: Including pain rating scale, medication(s)/side effects and non-pharmacologic comfort measures Outcome: Progressing   

## 2021-09-13 NOTE — Consult Note (Addendum)
ANTICOAGULATION CONSULT NOTE  Pharmacy Consult for Heparin drip Indication: chest pain/ACS  Patient Measurements: Height: 5\' 7"  (170.2 cm) Weight: 70.5 kg (155 lb 8 oz) IBW/kg (Calculated) : 66.1 Heparin Dosing Weight: 66.7 kg  Labs: Recent Labs    09/11/21 0818 09/11/21 1025 09/11/21 1430 09/11/21 1629 09/11/21 1938 09/12/21 1023 09/13/21 0555  HGB 9.5*  --   --   --   --  8.1* 8.1*  HCT 30.1*  --   --   --   --  25.1* 25.3*  PLT 125*  --   --   --   --  101* 113*  APTT 30  --   --   --   --   --   --   LABPROT 14.4  --   --   --   --   --   --   INR 1.1  --   --   --   --   --   --   HEPARINUNFRC  --   --   --   --  <0.10* <0.10* <0.10*  CREATININE 7.16*  --   --   --   --  7.56* 7.83*  TROPONINIHS 284* 419* 495* 488*  --   --   --      Estimated Creatinine Clearance: 8.8 mL/min (A) (by C-G formula based on SCr of 7.83 mg/dL (H)).   Medical History: Past Medical History:  Diagnosis Date   Anemia    Benign hypertensive renal disease    CAD (coronary artery disease)    a. 08/1996 s/p BMS to the mLAD ([facility]); b. 12/2017 MV: EF 46%, fixed apical defect w/ significant GI uptake artifact. No ischemia-> low risk; c. 07/2020 MV: EF 51%, no ischemia/infarct.   Carotid arterial disease ([place])    a. 11/2020 Carotid U/S: <50% bilat ICA stenoses.   CKD (chronic kidney disease), stage V ([place])    on peritoneal dialysis   Complication of anesthesia    please call by "[name]" when waking up!!   COPD (chronic obstructive pulmonary disease) (HCC)    centrilobular emphysema. pt is poorly controlled with his copd/smoking.   Depression    Diastolic dysfunction    a. 12/2017 Echo: EF 60-65%, no rwma, Gr1 DD, mild AI/MR. Nl RVSP; b. 08/2020 Echo: EF 60-65%, no rwma, GrI DD, nl RV fxn. RVSP 32.84mmHg.   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 12/2020 Zio: Sinus rhythm 80 (55-117). Rare PACs/PVCs. 17 episodes of SVT (longest 20.3 secs, fastest 184). No triggered events. No afib.   Smoker    Stroke [facility])    a. 11/2020 MRI Brain: patchy acute/early subacute cortical infarcts w/in the R frontal, parietal, and occiptal lobes. Small, chronic L basal ganglia lacunar infarct.   Thrombocytopenia ([place])    Assessment: Patient admitted with chest pain and SOB. All baseline labs already drawn. Troponin elevated and trending upward. Pharmacy consulted to manage heparin infusion for ACS/STEMI. NO DOAC or warfarin PTA. Patient s/p cath with heavily calcified three-vessel CAD and cardiomyopathy, recommendation for CABG  Noted ESRD - on peritoneal dialysis  12/22 10:23  HL < 0.1 12/23 05:55 HL < 0.1     Goal of Therapy:  Heparin level 0.3-0.7 units/ml Monitor platelets by anticoagulation protocol: Yes   Plan:  Heparin level is subtherapeutic and not detectable. Will give heparin bolus of 4000 units x 1 and increase heparin infusion to 1400 units/hr. Recheck heparin level in 8 hours and CBC daily while on heparin. Plan to transfer to [facility].   Oswald Hillock, PharmD, BCPS Clinical Pharmacist 09/13/2021 8:20 AM

## 2021-09-13 NOTE — Progress Notes (Addendum)
Patient woke up and c/o of anterior chest wall pain. EKG done x 2. Sinus tachy 101 on one and sinus rhythm on other. Patient c/o jabbing pain 7/10. Constant. VSS. Patient states no relief after nitro given x 2. Sharion Settler NP made aware. New order for morphine noted.   0704- Dialysis hot line called due to machine beeping. Corrected and trouble shoot this with hot line personell. After morphine was given patient states that pain has eased off 3/10, VSS. On 02 at 4L at present.

## 2021-09-13 NOTE — Progress Notes (Signed)
Progress Note  Patient Name: James Arnold Date of Encounter: 09/13/2021  [facility] Cardiologist: Nelva Bush, MD   Subjective   Patient coughing this AM. Michela Pitcher he did HD, but had trouble with this. We are waiting transfer to [facility] for CABG evaluation.  Inpatient Medications    Scheduled Meds:  aspirin  81 mg Oral Daily   atorvastatin  40 mg Oral Daily   carvedilol  25 mg Oral BID WC   cholecalciferol  1,000 Units Oral Daily   citalopram  40 mg Oral Daily   ferrous sulfate  325 mg Oral Q breakfast   furosemide  20 mg Oral Daily   gentamicin cream  1 application Topical Daily   levETIRAcetam  500 mg Oral QHS   nicotine  14 mg Transdermal Daily   nortriptyline  25 mg Oral QHS   pantoprazole  20 mg Oral Daily   sodium chloride flush  3 mL Intravenous Q12H   tamsulosin  0.4 mg Oral Daily   Continuous Infusions:  sodium chloride     dialysis solution 1.5% low-MG/low-CA     heparin 1,400 Units/hr (09/13/21 0848)   PRN Meds: sodium chloride, acetaminophen, albuterol, heparin, nitroGLYCERIN, ondansetron (ZOFRAN) IV, polyethylene glycol, sodium chloride flush   Vital Signs    Vitals:   09/13/21 0636 09/13/21 0645 09/13/21 0753 09/13/21 0832  BP: 122/67 118/66 119/68 (!) 121/57  Pulse: 100 (!) 101 97   Resp:   16 (!) 33  Temp:   98 F (36.7 C) 98.5 F (36.9 C)  TempSrc:    Oral  SpO2:   97% 100%  Weight:      Height:        Intake/Output Summary (Last 24 hours) at 09/13/2021 0933 Last data filed at 09/13/2021 0000 Gross per 24 hour  Intake 504 ml  Output 450 ml  Net 54 ml   Last 3 Weights 09/13/2021 09/11/2021 09/11/2021  Weight (lbs) 155 lb 8 oz 158 lb 8.2 oz 151 lb 14.4 oz  Weight (kg) 70.534 kg 71.9 kg 68.901 kg      Telemetry     NSR- Personally Reviewed  ECG    NSR, 96bpm, LVH with possible repol, TWI III, ST dep ant/lat leads - Personally Reviewed  Physical Exam   GEN: No acute distress.   Neck: No JVD Cardiac: RRR, no murmurs, rubs,  or gallops.  Respiratory: Clear to auscultation bilaterally. GI: Soft, nontender, non-distended  MS: No edema; No deformity. Neuro:  Nonfocal  Psych: Normal affect   Labs    High Sensitivity Troponin:   Recent Labs  Lab 09/11/21 0818 09/11/21 1025 09/11/21 1430 09/11/21 1629  TROPONINIHS 284* 419* 495* 488*     Chemistry Recent Labs  Lab 09/11/21 0818 09/12/21 1023 09/13/21 0555  NA 138 136 133*  K 4.2 4.2 4.3  CL 104 104 103  CO2 23 22 22   GLUCOSE 88 95 99  BUN 61* 62* 67*  CREATININE 7.16* 7.56* 7.83*  CALCIUM 7.5* 7.9* 8.0*  MG  --   --  1.6*  PROT 5.8*  --   --   ALBUMIN 3.1*  --   --   AST 14*  --   --   ALT 17  --   --   ALKPHOS 45  --   --   BILITOT 1.0  --   --   GFRNONAA 8* 7* 7*  ANIONGAP 11 10 8     Lipids No results for input(s): CHOL, TRIG, HDL,  LABVLDL, LDLCALC, CHOLHDL in the last 168 hours.  Hematology Recent Labs  Lab 09/11/21 0818 09/12/21 1023 09/13/21 0555  WBC 4.3 3.3* 3.2*  RBC 3.29* 2.79* 2.80*  HGB 9.5* 8.1* 8.1*  HCT 30.1* 25.1* 25.3*  MCV 91.5 90.0 90.4  MCH 28.9 29.0 28.9  MCHC 31.6 32.3 32.0  RDW 15.8* 15.7* 15.9*  PLT 125* 101* 113*   Thyroid No results for input(s): TSH, FREET4 in the last 168 hours.  BNPNo results for input(s): BNP, PROBNP in the last 168 hours.  DDimer  Recent Labs  Lab 09/11/21 0818  DDIMER 1.03*     Radiology    CARDIAC CATHETERIZATION  Addendum Date: 09/12/2021     1st Mrg lesion is 50% stenosed.   Dist Cx lesion is 95% stenosed.   Prox Cx to Mid Cx lesion is 40% stenosed.   Prox LAD to Mid LAD lesion is 95% stenosed.   Prox LAD lesion is 60% stenosed.   Prox RCA lesion is 40% stenosed.   Mid RCA lesion is 100% stenosed. 1.  Left dominant coronary arteries with severe three-vessel coronary artery disease.  The coronary arteries are heavily calcified especially the LAD. 2.  Left ventricular angiography was not performed to preserve contrast given residual renal function.  EF was moderately  reduced by echo. 3.  Mildly elevated left ventricular end-diastolic pressure at 19 mmHg. Recommendations: Given heavily calcified three-vessel coronary artery disease and cardiomyopathy, best option is CABG if the patient is deemed to be a candidate considering his comorbidities.  Otherwise, high risk PCI to the LAD and left circumflex can be considered with atherectomy to the LAD. Recommend resuming heparin drip in 6 hours (at 2200)  Result Date: 09/12/2021   1st Mrg lesion is 50% stenosed.   Dist Cx lesion is 95% stenosed.   Prox Cx to Mid Cx lesion is 40% stenosed.   Prox LAD to Mid LAD lesion is 95% stenosed.   Prox LAD lesion is 60% stenosed.   Prox RCA lesion is 40% stenosed.   Mid RCA lesion is 100% stenosed. 1.  Left dominant coronary arteries with severe three-vessel coronary artery disease.  The coronary arteries are heavily calcified especially the LAD. 2.  Left ventricular angiography was not performed to preserve contrast given residual renal function.  EF was moderately reduced by echo. 3.  Mildly elevated left ventricular end-diastolic pressure at 19 mmHg. Recommendations: Given heavily calcified three-vessel coronary artery disease and cardiomyopathy, best option is CABG if the patient is deemed to be a candidate considering his comorbidities.  Otherwise, high risk PCI to the LAD and left circumflex can be considered with atherectomy to the LAD.   US Venous Img Lower Bilateral (DVT)  Result Date: 09/11/2021 CLINICAL DATA:  Unable to obtain CT.  Rule out DVT. EXAM: BILATERAL LOWER EXTREMITY VENOUS DOPPLER ULTRASOUND TECHNIQUE: Gray-scale sonography with graded compression, as well as color Doppler and duplex ultrasound were performed to evaluate the lower extremity deep venous systems from the level of the common femoral vein and including the common femoral, femoral, profunda femoral, popliteal and calf veins including the posterior tibial, peroneal and gastrocnemius veins when visible. The  superficial great saphenous vein was also interrogated. Spectral Doppler was utilized to evaluate flow at rest and with distal augmentation maneuvers in the common femoral, femoral and popliteal veins. COMPARISON:  Chest XR, 09/11/2021. FINDINGS: RIGHT LOWER EXTREMITY VENOUS Normal compressibility of the RIGHT common femoral, superficial femoral, and popliteal veins, as well as the visualized calf  veins. Visualized portions of profunda femoral vein and great saphenous vein unremarkable. No filling defects to suggest DVT on grayscale or color Doppler imaging. Doppler waveforms show normal direction of venous flow, normal respiratory plasticity and response to augmentation. OTHER No evidence of superficial thrombophlebitis or abnormal fluid collection. Limitations: none LEFT LOWER EXTREMITY VENOUS Normal compressibility of the LEFT common femoral, superficial femoral, and popliteal veins, as well as the visualized calf veins. Visualized portions of profunda femoral vein and great saphenous vein unremarkable. No filling defects to suggest DVT on grayscale or color Doppler imaging. Doppler waveforms show normal direction of venous flow, normal respiratory plasticity and response to augmentation. OTHER None. Limitations: none IMPRESSION: No evidence of femoropopliteal DVT within either lower extremity. Michaelle Birks, MD [facility] Electronically Signed   By: Michaelle Birks M.D.   On: 09/11/2021 12:05   ECHOCARDIOGRAM COMPLETE  Result Date: 09/12/2021    ECHOCARDIOGRAM REPORT   Patient Name:   James Arnold Date of Exam: 09/12/2021 Medical Rec #:  [id number]     Height:       67.0 in Accession #:    [id number]    Weight:       158.5 lb Date of Birth:  July 15, 1956      BSA:          1.832 m Patient Age:    65 years      BP:           132/70 mmHg Patient Gender: M             HR:           102 bpm. Exam Location:  [facility] Procedure: 2D Echo, Color Doppler and Cardiac  Doppler Indications:     I21.4 NSTEMI  History:         Patient has prior history of Echocardiogram examinations, most                  recent 11/22/2020. CAD, CKD, stage V, COPD and Stroke; Risk                  Factors:Hypertension, Dyslipidemia and Current Smoker.  Sonographer:     Charmayne Sheer Referring Phys:  [place] Diagnosing Phys: Kathlyn Sacramento MD  Sonographer Comments: Global longitudinal strain was attempted. IMPRESSIONS  1. Left ventricular ejection fraction, by estimation, is 35 to 40%. The left ventricle has moderately decreased function. The left ventricle demonstrates global hypokinesis. The left ventricular internal cavity size was mildly dilated. Left ventricular diastolic parameters are consistent with Grade II diastolic dysfunction (pseudonormalization). The average left ventricular global longitudinal strain is -9.8 %. The global longitudinal strain is abnormal.  2. Right ventricular systolic function is normal. The right ventricular size is normal.  3. Left atrial size was mildly dilated.  4. The mitral valve is normal in structure. Mild mitral valve regurgitation. No evidence of mitral stenosis.  5. The aortic valve is calcified. Aortic valve regurgitation is not visualized. Mild aortic valve stenosis. Aortic valve area, by VTI measures 1.73 cm. Aortic valve mean gradient measures 14.0 mmHg.  6. The inferior vena cava is dilated in size with <50% respiratory variability, suggesting right atrial pressure of 15 mmHg. FINDINGS  Left Ventricle: Left ventricular ejection fraction, by estimation, is 35 to 40%. The left ventricle has moderately decreased function. The left ventricle demonstrates global hypokinesis. The average left ventricular global longitudinal strain is -9.8 %.  The global longitudinal strain  is abnormal. The left ventricular internal cavity size was mildly dilated. There is no left ventricular hypertrophy. Left ventricular diastolic parameters are consistent with Grade  II diastolic dysfunction (pseudonormalization). Right Ventricle: The right ventricular size is normal. No increase in right ventricular wall thickness. Right ventricular systolic function is normal. Left Atrium: Left atrial size was mildly dilated. Right Atrium: Right atrial size was normal in size. Pericardium: There is no evidence of pericardial effusion. Mitral Valve: The mitral valve is normal in structure. Mild to moderate mitral annular calcification. Mild mitral valve regurgitation. No evidence of mitral valve stenosis. MV peak gradient, 7.6 mmHg. The mean mitral valve gradient is 5.0 mmHg. Tricuspid Valve: The tricuspid valve is normal in structure. Tricuspid valve regurgitation is not demonstrated. No evidence of tricuspid stenosis. Aortic Valve: The aortic valve is calcified. Aortic valve regurgitation is not visualized. Mild aortic stenosis is present. Aortic valve mean gradient measures 14.0 mmHg. Aortic valve peak gradient measures 23.5 mmHg. Aortic valve area, by VTI measures 1.73 cm. Pulmonic Valve: The pulmonic valve was normal in structure. Pulmonic valve regurgitation is not visualized. No evidence of pulmonic stenosis. Aorta: The aortic root is normal in size and structure. Venous: The inferior vena cava is dilated in size with less than 50% respiratory variability, suggesting right atrial pressure of 15 mmHg. IAS/Shunts: No atrial level shunt detected by color flow Doppler.  LEFT VENTRICLE PLAX 2D LVIDd:         5.50 cm   Diastology LVIDs:         3.96 cm   LV e' medial:    6.20 cm/s LV PW:         0.98 cm   LV E/e' medial:  22.4 LV IVS:        0.93 cm   LV e' lateral:   7.40 cm/s LVOT diam:     2.20 cm   LV E/e' lateral: 18.8 LV SV:         73 LV SV Index:   40        2D Longitudinal Strain LVOT Area:     3.80 cm  2D Strain GLS Avg:     -9.8 %  RIGHT VENTRICLE RV Basal diam:  3.23 cm RV S prime:     11.20 cm/s LEFT ATRIUM           Index        RIGHT ATRIUM           Index LA diam:      4.40  cm 2.40 cm/m   RA Area:     18.60 cm LA Vol (A2C): 86.9 ml 47.43 ml/m  RA Volume:   56.00 ml  30.57 ml/m LA Vol (A4C): 51.1 ml 27.89 ml/m  AORTIC VALVE                     PULMONIC VALVE AV Area (Vmax):    1.80 cm      PV Vmax:       1.08 m/s AV Area (Vmean):   1.70 cm      PV Vmean:      75.400 cm/s AV Area (VTI):     1.73 cm      PV VTI:        0.167 m AV Vmax:           242.50 cm/s   PV Peak grad:  4.7 mmHg AV Vmean:          177.000  cm/s  PV Mean grad:  2.0 mmHg AV VTI:            0.419 m AV Peak Grad:      23.5 mmHg AV Mean Grad:      14.0 mmHg LVOT Vmax:         115.00 cm/s LVOT Vmean:        79.100 cm/s LVOT VTI:          0.191 m LVOT/AV VTI ratio: 0.46  AORTA Ao Root diam: 3.10 cm MITRAL VALVE MV Area (PHT): 6.32 cm     SHUNTS MV Area VTI:   2.49 cm     Systemic VTI:  0.19 m MV Peak grad:  7.6 mmHg     Systemic Diam: 2.20 cm MV Mean grad:  5.0 mmHg MV Vmax:       1.38 m/s MV Vmean:      108.0 cm/s MV Decel Time: 120 msec MV E velocity: 139.00 cm/s MV A velocity: 131.00 cm/s MV E/A ratio:  1.06 Kathlyn Sacramento MD Electronically signed by Kathlyn Sacramento MD Signature Date/Time: 09/12/2021/4:56:25 PM    Final     Cardiac Studies   LHC   1st Mrg lesion is 50% stenosed.   Dist Cx lesion is 95% stenosed.   Prox Cx to Mid Cx lesion is 40% stenosed.   Prox LAD to Mid LAD lesion is 95% stenosed.   Prox LAD lesion is 60% stenosed.   Prox RCA lesion is 40% stenosed.   Mid RCA lesion is 100% stenosed.   1.  Left dominant coronary arteries with severe three-vessel coronary artery disease.  The coronary arteries are heavily calcified especially the LAD. 2.  Left ventricular angiography was not performed to preserve contrast given residual renal function.  EF was moderately reduced by echo. 3.  Mildly elevated left ventricular end-diastolic pressure at 19 mmHg.   Recommendations: Given heavily calcified three-vessel coronary artery disease and cardiomyopathy, best option is CABG if the patient is  deemed to be a candidate considering his comorbidities.  Otherwise, high risk PCI to the LAD and left circumflex can be considered with atherectomy to the LAD. Recommend resuming heparin drip in 6 hours (at 2200)   Coronary Diagrams  Diagnostic Dominance: Left   Echo 09/12/21  1. Left ventricular ejection fraction, by estimation, is 35 to 40%. The  left ventricle has moderately decreased function. The left ventricle  demonstrates global hypokinesis. The left ventricular internal cavity size  was mildly dilated. Left ventricular  diastolic parameters are consistent with Grade II diastolic dysfunction  (pseudonormalization). The average left ventricular global longitudinal  strain is -9.8 %. The global longitudinal strain is abnormal.   2. Right ventricular systolic function is normal. The right ventricular  size is normal.   3. Left atrial size was mildly dilated.   4. The mitral valve is normal in structure. Mild mitral valve  regurgitation. No evidence of mitral stenosis.   5. The aortic valve is calcified. Aortic valve regurgitation is not  visualized. Mild aortic valve stenosis. Aortic valve area, by VTI measures  1.73 cm. Aortic valve mean gradient measures 14.0 mmHg.   6. The inferior vena cava is dilated in size with <50% respiratory  variability, suggesting right atrial pressure of 15 mmHg.   Patient Profile     65 y.o. male COPD, anemia, CAD, HTN, HLD, thrombocytopenia, , FSGS resulting in ESRD on peritoneal dilaysis who is being seen for NSTEMI.   Assessment & Plan  NSTEMI CAD with prior stent to LAD 1997 - presented with chest pain and troponin up to 495. EKG with ST/T wave changes anterolaterally - Echo showed moderately reduced EF - LHC showed severe 3V CAD, heavily calcified arteries in the LAD. Recommendation for transfer for CABG evaluation.  - We are waiting on transfer to [facility] showed mildly elevated LVEDP 19 mmHg s/p IV lasix - If CABG is not an  option, consider high risk PCI to the LAD and left circumflex can be considered with atherectomy to the LAD. - cath site stable - IV heparin restarted.  - continue ASA 81mg  daily, Lipitor 40mg  daily and Coreg  ESRD on dialysis - per nephrology  Smoker/COPD - still smoking 1/2 ppd - smoking cessation recommended  Myelofibrosis - with progressive splenomegaly - followed by hematology  For questions or updates, please contact [facility] Please consult [email or web address] for contact info under        Signed, Olie Scaffidi Ninfa Meeker, PA-C  09/13/2021, 9:33 AM

## 2021-09-13 NOTE — Progress Notes (Signed)
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  Patient presents to the emergency department with complaints of chest pain and discomfort.  Patient will be admitted for Leg swelling [M79.89] NSTEMI (non-ST elevated myocardial infarction) [facility]) [I21.4]  Patient is known to our clinic and receives peritoneal dialysis supervision at [facility] with Dr. Holley Raring.    Update: Patient resting quietly in bed Wife at bedside Denies shortness of breath Reports Left abd pain and discomfort, states it's started about 5 am.    Objective:  Vital signs in last 24 hours:  Temp:  [97.6 F (36.4 C)-98.6 F (37 C)] 97.8 F (36.6 C) (12/23 1126) Pulse Rate:  [82-101] 82 (12/23 1126) Resp:  [16-33] 19 (12/23 1126) BP: (102-127)/(57-71) 102/59 (12/23 1126) SpO2:  [93 %-100 %] 98 % (12/23 1126) Weight:  [70.5 kg] 70.5 kg (12/23 0403)  Weight change: 3.856 kg Filed Weights   09/11/21 1825 09/11/21 2340 09/13/21 0403  Weight: 68.9 kg 71.9 kg 70.5 kg    Intake/Output: I/O last 3 completed shifts: In: 14199.7 [P.O.:840; I.V.:159.7; Other:13200] Out: 77824 [Urine:850; MPNTI:14431]   Intake/Output this shift:  Total I/O In: 240 [P.O.:240] Out: 200 [Urine:200]  Physical Exam: General: NAD, laying in bed  Head: Normocephalic, atraumatic. Moist oral mucosal membranes  Eyes: Anicteric  Lungs:  Clear to auscultation, normal effort  Heart: Regular rate and rhythm  Abdomen:  Soft, Left upper tenderness  Extremities:  No peripheral edema.  Neurologic: Nonfocal, moving all four extremities  Skin: No lesions  Access: PD catheter    Basic Metabolic Panel: Recent Labs  Lab 09/11/21 0818 09/12/21 1023 09/13/21 0555  NA 138 136 133*  K 4.2 4.2 4.3  CL 104 104 103  CO2 23 22 22    GLUCOSE 88 95 99  BUN 61* 62* 67*  CREATININE 7.16* 7.56* 7.83*  CALCIUM 7.5* 7.9* 8.0*  MG  --   --  1.6*     Liver Function Tests: Recent Labs  Lab 09/11/21 0818  AST 14*  ALT 17  ALKPHOS 45  BILITOT 1.0  PROT 5.8*  ALBUMIN 3.1*    Recent Labs  Lab 09/11/21 0818  LIPASE 27    No results for input(s): AMMONIA in the last 168 hours.  CBC: Recent Labs  Lab 09/11/21 0818 09/12/21 1023 09/13/21 0555  WBC 4.3 3.3* 3.2*  HGB 9.5* 8.1* 8.1*  HCT 30.1* 25.1* 25.3*  MCV 91.5 90.0 90.4  PLT 125* 101* 113*     Cardiac Enzymes: No results for input(s): CKTOTAL, CKMB, CKMBINDEX, TROPONINI in the last 168 hours.  BNP: Invalid input(s): POCBNP  CBG: Recent Labs  Lab 09/11/21 2117 09/12/21 0727  GLUCAP 99 123*     Microbiology: Results for orders placed or performed during the hospital encounter of 09/11/21  Resp Panel by RT-PCR (Flu A&B, Covid) Nasopharyngeal Swab     Status: None   Collection Time: 09/11/21 10:53 AM   Specimen: Nasopharyngeal Swab; Nasopharyngeal(NP) swabs in vial transport medium  Result Value Ref Range Status   SARS Coronavirus 2 by RT PCR NEGATIVE NEGATIVE Final    Comment: (NOTE) SARS-CoV-2 target nucleic acids are NOT DETECTED.  The SARS-CoV-2 RNA is generally detectable in upper respiratory specimens during the acute phase of infection. The lowest concentration of SARS-CoV-2 viral copies this assay can detect is 138  copies/mL. A negative result does not preclude SARS-Cov-2 infection and should not be used as the sole basis for treatment or other patient management decisions. A negative result may occur with  improper specimen collection/handling, submission of specimen other than nasopharyngeal swab, presence of viral mutation(s) within the areas targeted by this assay, and inadequate number of viral copies(<138 copies/mL). A negative result must be combined with clinical observations, patient history, and  epidemiological information. The expected result is Negative.  Fact Sheet for Patients:  EntrepreneurPulse.com.au  Fact Sheet for Healthcare Providers:  IncredibleEmployment.be  This test is no t yet approved or cleared by the [place] FDA and  has been authorized for detection and/or diagnosis of SARS-CoV-2 by FDA under an Emergency Use Authorization (EUA). This EUA will remain  in effect (meaning this test can be used) for the duration of the COVID-19 declaration under Section 564(b)(1) of the Act, 21 U.S.C.section 360bbb-3(b)(1), unless the authorization is terminated  or revoked sooner.       Influenza A by PCR NEGATIVE NEGATIVE Final   Influenza B by PCR NEGATIVE NEGATIVE Final    Comment: (NOTE) The Xpert Xpress SARS-CoV-2/FLU/RSV plus assay is intended as an aid in the diagnosis of influenza from Nasopharyngeal swab specimens and should not be used as a sole basis for treatment. Nasal washings and aspirates are unacceptable for Xpert Xpress SARS-CoV-2/FLU/RSV testing.  Fact Sheet for Patients: EntrepreneurPulse.com.au  Fact Sheet for Healthcare Providers: IncredibleEmployment.be  This test is not yet approved or cleared by the [place] FDA and has been authorized for detection and/or diagnosis of SARS-CoV-2 by FDA under an Emergency Use Authorization (EUA). This EUA will remain in effect (meaning this test can be used) for the duration of the COVID-19 declaration under Section 564(b)(1) of the Act, 21 U.S.C. section 360bbb-3(b)(1), unless the authorization is terminated or revoked.  Performed at [facility], [address] [place], [place] [address]     Coagulation Studies: Recent Labs    09/11/21 0818  LABPROT 14.4  INR 1.1     Urinalysis: No results for input(s): COLORURINE, LABSPEC, PHURINE, GLUCOSEU, HGBUR, BILIRUBINUR, KETONESUR, PROTEINUR, UROBILINOGEN,  NITRITE, LEUKOCYTESUR in the last 72 hours.  Invalid input(s): APPERANCEUR    Imaging: CARDIAC CATHETERIZATION  Addendum Date: 09/12/2021     1st Mrg lesion is 50% stenosed.   Dist Cx lesion is 95% stenosed.   Prox Cx to Mid Cx lesion is 40% stenosed.   Prox LAD to Mid LAD lesion is 95% stenosed.   Prox LAD lesion is 60% stenosed.   Prox RCA lesion is 40% stenosed.   Mid RCA lesion is 100% stenosed. 1.  Left dominant coronary arteries with severe three-vessel coronary artery disease.  The coronary arteries are heavily calcified especially the LAD. 2.  Left ventricular angiography was not performed to preserve contrast given residual renal function.  EF was moderately reduced by echo. 3.  Mildly elevated left ventricular end-diastolic pressure at 19 mmHg. Recommendations: Given heavily calcified three-vessel coronary artery disease and cardiomyopathy, best option is CABG if the patient is deemed to be a candidate considering his comorbidities.  Otherwise, high risk PCI to the LAD and left circumflex can be considered with atherectomy to the LAD. Recommend resuming heparin drip in 6 hours (at 2200)  Result Date: 09/12/2021   1st Mrg lesion is 50% stenosed.   Dist Cx lesion is 95% stenosed.   Prox Cx to Mid Cx lesion is 40% stenosed.   Prox LAD to Mid LAD  lesion is 95% stenosed.   Prox LAD lesion is 60% stenosed.   Prox RCA lesion is 40% stenosed.   Mid RCA lesion is 100% stenosed. 1.  Left dominant coronary arteries with severe three-vessel coronary artery disease.  The coronary arteries are heavily calcified especially the LAD. 2.  Left ventricular angiography was not performed to preserve contrast given residual renal function.  EF was moderately reduced by echo. 3.  Mildly elevated left ventricular end-diastolic pressure at 19 mmHg. Recommendations: Given heavily calcified three-vessel coronary artery disease and cardiomyopathy, best option is CABG if the patient is deemed to be a candidate considering  his comorbidities.  Otherwise, high risk PCI to the LAD and left circumflex can be considered with atherectomy to the LAD.   ECHOCARDIOGRAM COMPLETE  Result Date: 09/12/2021    ECHOCARDIOGRAM REPORT   Patient Name:   James Arnold Date of Exam: 09/12/2021 Medical Rec #:  [id number]     Height:       67.0 in Accession #:    [id number]    Weight:       158.5 lb Date of Birth:  February 24, 1956      BSA:          1.832 m Patient Age:    82 years      BP:           132/70 mmHg Patient Gender: M             HR:           102 bpm. Exam Location:  [facility] Procedure: 2D Echo, Color Doppler and Cardiac Doppler Indications:     I21.4 NSTEMI  History:         Patient has prior history of Echocardiogram examinations, most                  recent 11/22/2020. CAD, CKD, stage V, COPD and Stroke; Risk                  Factors:Hypertension, Dyslipidemia and Current Smoker.  Sonographer:     Charmayne Sheer Referring Phys:  [place] Diagnosing Phys: Kathlyn Sacramento MD  Sonographer Comments: Global longitudinal strain was attempted. IMPRESSIONS  1. Left ventricular ejection fraction, by estimation, is 35 to 40%. The left ventricle has moderately decreased function. The left ventricle demonstrates global hypokinesis. The left ventricular internal cavity size was mildly dilated. Left ventricular diastolic parameters are consistent with Grade II diastolic dysfunction (pseudonormalization). The average left ventricular global longitudinal strain is -9.8 %. The global longitudinal strain is abnormal.  2. Right ventricular systolic function is normal. The right ventricular size is normal.  3. Left atrial size was mildly dilated.  4. The mitral valve is normal in structure. Mild mitral valve regurgitation. No evidence of mitral stenosis.  5. The aortic valve is calcified. Aortic valve regurgitation is not visualized. Mild aortic valve stenosis. Aortic valve area, by VTI measures 1.73 cm. Aortic valve mean gradient measures 14.0 mmHg.  6. The  inferior vena cava is dilated in size with <50% respiratory variability, suggesting right atrial pressure of 15 mmHg. FINDINGS  Left Ventricle: Left ventricular ejection fraction, by estimation, is 35 to 40%. The left ventricle has moderately decreased function. The left ventricle demonstrates global hypokinesis. The average left ventricular global longitudinal strain is -9.8 %.  The global longitudinal strain is abnormal. The left ventricular internal cavity size was mildly dilated. There is no left ventricular hypertrophy. Left ventricular diastolic parameters  are consistent with Grade II diastolic dysfunction (pseudonormalization). Right Ventricle: The right ventricular size is normal. No increase in right ventricular wall thickness. Right ventricular systolic function is normal. Left Atrium: Left atrial size was mildly dilated. Right Atrium: Right atrial size was normal in size. Pericardium: There is no evidence of pericardial effusion. Mitral Valve: The mitral valve is normal in structure. Mild to moderate mitral annular calcification. Mild mitral valve regurgitation. No evidence of mitral valve stenosis. MV peak gradient, 7.6 mmHg. The mean mitral valve gradient is 5.0 mmHg. Tricuspid Valve: The tricuspid valve is normal in structure. Tricuspid valve regurgitation is not demonstrated. No evidence of tricuspid stenosis. Aortic Valve: The aortic valve is calcified. Aortic valve regurgitation is not visualized. Mild aortic stenosis is present. Aortic valve mean gradient measures 14.0 mmHg. Aortic valve peak gradient measures 23.5 mmHg. Aortic valve area, by VTI measures 1.73 cm. Pulmonic Valve: The pulmonic valve was normal in structure. Pulmonic valve regurgitation is not visualized. No evidence of pulmonic stenosis. Aorta: The aortic root is normal in size and structure. Venous: The inferior vena cava is dilated in size with less than 50% respiratory variability, suggesting right atrial pressure of 15 mmHg.  IAS/Shunts: No atrial level shunt detected by color flow Doppler.  LEFT VENTRICLE PLAX 2D LVIDd:         5.50 cm   Diastology LVIDs:         3.96 cm   LV e' medial:    6.20 cm/s LV PW:         0.98 cm   LV E/e' medial:  22.4 LV IVS:        0.93 cm   LV e' lateral:   7.40 cm/s LVOT diam:     2.20 cm   LV E/e' lateral: 18.8 LV SV:         73 LV SV Index:   40        2D Longitudinal Strain LVOT Area:     3.80 cm  2D Strain GLS Avg:     -9.8 %  RIGHT VENTRICLE RV Basal diam:  3.23 cm RV S prime:     11.20 cm/s LEFT ATRIUM           Index        RIGHT ATRIUM           Index LA diam:      4.40 cm 2.40 cm/m   RA Area:     18.60 cm LA Vol (A2C): 86.9 ml 47.43 ml/m  RA Volume:   56.00 ml  30.57 ml/m LA Vol (A4C): 51.1 ml 27.89 ml/m  AORTIC VALVE                     PULMONIC VALVE AV Area (Vmax):    1.80 cm      PV Vmax:       1.08 m/s AV Area (Vmean):   1.70 cm      PV Vmean:      75.400 cm/s AV Area (VTI):     1.73 cm      PV VTI:        0.167 m AV Vmax:           242.50 cm/s   PV Peak grad:  4.7 mmHg AV Vmean:          177.000 cm/s  PV Mean grad:  2.0 mmHg AV VTI:            0.419  m AV Peak Grad:      23.5 mmHg AV Mean Grad:      14.0 mmHg LVOT Vmax:         115.00 cm/s LVOT Vmean:        79.100 cm/s LVOT VTI:          0.191 m LVOT/AV VTI ratio: 0.46  AORTA Ao Root diam: 3.10 cm MITRAL VALVE MV Area (PHT): 6.32 cm     SHUNTS MV Area VTI:   2.49 cm     Systemic VTI:  0.19 m MV Peak grad:  7.6 mmHg     Systemic Diam: 2.20 cm MV Mean grad:  5.0 mmHg MV Vmax:       1.38 m/s MV Vmean:      108.0 cm/s MV Decel Time: 120 msec MV E velocity: 139.00 cm/s MV A velocity: 131.00 cm/s MV E/A ratio:  1.06 Kathlyn Sacramento MD Electronically signed by Kathlyn Sacramento MD Signature Date/Time: 09/12/2021/4:56:25 PM    Final      Medications:    sodium chloride     dialysis solution 1.5% low-MG/low-CA     heparin 1,400 Units/hr (09/13/21 0848)    aspirin  81 mg Oral Daily   atorvastatin  40 mg Oral Daily   carvedilol  25 mg  Oral BID WC   cholecalciferol  1,000 Units Oral Daily   citalopram  40 mg Oral Daily   ferrous sulfate  325 mg Oral Q breakfast   furosemide  20 mg Oral Daily   gentamicin cream  1 application Topical Daily   levETIRAcetam  500 mg Oral QHS   nicotine  14 mg Transdermal Daily   nortriptyline  25 mg Oral QHS   pantoprazole  20 mg Oral Daily   sodium chloride flush  3 mL Intravenous Q12H   tamsulosin  0.4 mg Oral Daily   sodium chloride, acetaminophen, albuterol, heparin, nitroGLYCERIN, ondansetron (ZOFRAN) IV, polyethylene glycol, sodium chloride flush  Assessment/ Plan:  James Arnold is a 65 y.o.  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  Patient presents to the emergency department with complaints of chest pain and discomfort.  Patient will be admitted for Leg swelling [M79.89] NSTEMI (non-ST elevated myocardial infarction) ([place]) [I21.4]  [facility] -PD 2253ml fills/5 cycles/no last fill/1020  End-stage renal disease on peritoneal dialysis.  Will maintain nightly treatments during this admission if possible.   -PD treatment not received due to dialyzer malfunction. Planned transfer to tertiary facility so will not schedule a daytime treatment. Labs stable, will resume treatment tonight, if remains inpatient.  -Receive message from care nurse that patient having shortness of breath, ordered Furosemide 80mg  IV once.    2. Anemia of chronic kidney disease Lab Results  Component Value Date   HGB 8.1 (L) 09/13/2021    Hgb not at target. EPO outpatient subcu every 2 weeks outpatient.   3. Secondary Hyperparathyroidism:   Lab Results  Component Value Date   CALCIUM 8.0 (L) 09/13/2021   CAION 1.02 (L) 11/19/2020   PHOS 7.8 (H) 11/22/2020    Phosphorus remains elevated. Will continue to monitor     LOS: 2 [place] 12/23/20221:01 PM

## 2021-09-14 DIAGNOSIS — I2510 Atherosclerotic heart disease of native coronary artery without angina pectoris: Secondary | ICD-10-CM | POA: Diagnosis not present

## 2021-09-15 DIAGNOSIS — J9811 Atelectasis: Secondary | ICD-10-CM | POA: Diagnosis not present

## 2021-09-15 DIAGNOSIS — Z992 Dependence on renal dialysis: Secondary | ICD-10-CM | POA: Diagnosis not present

## 2021-09-15 DIAGNOSIS — N186 End stage renal disease: Secondary | ICD-10-CM | POA: Diagnosis not present

## 2021-09-15 DIAGNOSIS — J90 Pleural effusion, not elsewhere classified: Secondary | ICD-10-CM | POA: Diagnosis not present

## 2021-09-15 DIAGNOSIS — I2510 Atherosclerotic heart disease of native coronary artery without angina pectoris: Secondary | ICD-10-CM | POA: Diagnosis not present

## 2021-09-16 DIAGNOSIS — Z01818 Encounter for other preprocedural examination: Secondary | ICD-10-CM | POA: Diagnosis not present

## 2021-09-16 DIAGNOSIS — Z992 Dependence on renal dialysis: Secondary | ICD-10-CM | POA: Diagnosis not present

## 2021-09-16 DIAGNOSIS — I2510 Atherosclerotic heart disease of native coronary artery without angina pectoris: Secondary | ICD-10-CM | POA: Diagnosis not present

## 2021-09-16 DIAGNOSIS — N186 End stage renal disease: Secondary | ICD-10-CM | POA: Diagnosis not present

## 2021-09-16 DIAGNOSIS — R339 Retention of urine, unspecified: Secondary | ICD-10-CM | POA: Diagnosis not present

## 2021-09-17 ENCOUNTER — Ambulatory Visit: Payer: Medicaid Other | Admitting: Family Medicine

## 2021-09-17 DIAGNOSIS — Z992 Dependence on renal dialysis: Secondary | ICD-10-CM | POA: Diagnosis not present

## 2021-09-17 DIAGNOSIS — F17200 Nicotine dependence, unspecified, uncomplicated: Secondary | ICD-10-CM | POA: Diagnosis not present

## 2021-09-17 DIAGNOSIS — N289 Disorder of kidney and ureter, unspecified: Secondary | ICD-10-CM | POA: Diagnosis not present

## 2021-09-17 DIAGNOSIS — R339 Retention of urine, unspecified: Secondary | ICD-10-CM | POA: Diagnosis not present

## 2021-09-17 DIAGNOSIS — N186 End stage renal disease: Secondary | ICD-10-CM | POA: Diagnosis not present

## 2021-09-18 DIAGNOSIS — N186 End stage renal disease: Secondary | ICD-10-CM | POA: Diagnosis not present

## 2021-09-18 DIAGNOSIS — E785 Hyperlipidemia, unspecified: Secondary | ICD-10-CM | POA: Diagnosis not present

## 2021-09-18 DIAGNOSIS — R339 Retention of urine, unspecified: Secondary | ICD-10-CM | POA: Diagnosis not present

## 2021-09-18 DIAGNOSIS — I2584 Coronary atherosclerosis due to calcified coronary lesion: Secondary | ICD-10-CM | POA: Diagnosis not present

## 2021-09-18 DIAGNOSIS — I120 Hypertensive chronic kidney disease with stage 5 chronic kidney disease or end stage renal disease: Secondary | ICD-10-CM | POA: Diagnosis not present

## 2021-09-18 DIAGNOSIS — I2510 Atherosclerotic heart disease of native coronary artery without angina pectoris: Secondary | ICD-10-CM | POA: Diagnosis not present

## 2021-09-18 DIAGNOSIS — T82855A Stenosis of coronary artery stent, initial encounter: Secondary | ICD-10-CM | POA: Diagnosis not present

## 2021-09-18 DIAGNOSIS — Z992 Dependence on renal dialysis: Secondary | ICD-10-CM | POA: Diagnosis not present

## 2021-09-18 DIAGNOSIS — Z720 Tobacco use: Secondary | ICD-10-CM | POA: Diagnosis not present

## 2021-09-18 DIAGNOSIS — I214 Non-ST elevation (NSTEMI) myocardial infarction: Secondary | ICD-10-CM | POA: Diagnosis not present

## 2021-09-19 DIAGNOSIS — I214 Non-ST elevation (NSTEMI) myocardial infarction: Secondary | ICD-10-CM | POA: Diagnosis not present

## 2021-09-19 DIAGNOSIS — Z720 Tobacco use: Secondary | ICD-10-CM | POA: Diagnosis not present

## 2021-09-19 DIAGNOSIS — Z992 Dependence on renal dialysis: Secondary | ICD-10-CM | POA: Diagnosis not present

## 2021-09-19 DIAGNOSIS — D471 Chronic myeloproliferative disease: Secondary | ICD-10-CM | POA: Diagnosis not present

## 2021-09-19 DIAGNOSIS — N186 End stage renal disease: Secondary | ICD-10-CM | POA: Diagnosis not present

## 2021-09-20 DIAGNOSIS — N186 End stage renal disease: Secondary | ICD-10-CM | POA: Diagnosis not present

## 2021-09-20 DIAGNOSIS — Z992 Dependence on renal dialysis: Secondary | ICD-10-CM | POA: Diagnosis not present

## 2021-09-21 DIAGNOSIS — Z992 Dependence on renal dialysis: Secondary | ICD-10-CM | POA: Diagnosis not present

## 2021-09-21 DIAGNOSIS — N186 End stage renal disease: Secondary | ICD-10-CM | POA: Diagnosis not present

## 2021-09-22 DIAGNOSIS — N186 End stage renal disease: Secondary | ICD-10-CM | POA: Diagnosis not present

## 2021-09-22 DIAGNOSIS — Z992 Dependence on renal dialysis: Secondary | ICD-10-CM | POA: Diagnosis not present

## 2021-09-23 DIAGNOSIS — N186 End stage renal disease: Secondary | ICD-10-CM | POA: Diagnosis not present

## 2021-09-23 DIAGNOSIS — Z992 Dependence on renal dialysis: Secondary | ICD-10-CM | POA: Diagnosis not present

## 2021-09-24 DIAGNOSIS — N186 End stage renal disease: Secondary | ICD-10-CM | POA: Diagnosis not present

## 2021-09-24 DIAGNOSIS — Z992 Dependence on renal dialysis: Secondary | ICD-10-CM | POA: Diagnosis not present

## 2021-09-25 ENCOUNTER — Encounter: Payer: Self-pay | Admitting: *Deleted

## 2021-09-25 ENCOUNTER — Other Ambulatory Visit: Payer: Self-pay | Admitting: *Deleted

## 2021-09-25 DIAGNOSIS — Z992 Dependence on renal dialysis: Secondary | ICD-10-CM | POA: Diagnosis not present

## 2021-09-25 DIAGNOSIS — N186 End stage renal disease: Secondary | ICD-10-CM | POA: Diagnosis not present

## 2021-09-25 DIAGNOSIS — I214 Non-ST elevation (NSTEMI) myocardial infarction: Secondary | ICD-10-CM

## 2021-09-25 DIAGNOSIS — Z955 Presence of coronary angioplasty implant and graft: Secondary | ICD-10-CM

## 2021-09-26 DIAGNOSIS — N186 End stage renal disease: Secondary | ICD-10-CM | POA: Diagnosis not present

## 2021-09-26 DIAGNOSIS — Z992 Dependence on renal dialysis: Secondary | ICD-10-CM | POA: Diagnosis not present

## 2021-09-27 DIAGNOSIS — Z992 Dependence on renal dialysis: Secondary | ICD-10-CM | POA: Diagnosis not present

## 2021-09-27 DIAGNOSIS — N186 End stage renal disease: Secondary | ICD-10-CM | POA: Diagnosis not present

## 2021-09-29 DIAGNOSIS — N186 End stage renal disease: Secondary | ICD-10-CM | POA: Diagnosis not present

## 2021-09-29 DIAGNOSIS — Z992 Dependence on renal dialysis: Secondary | ICD-10-CM | POA: Diagnosis not present

## 2021-09-30 DIAGNOSIS — N186 End stage renal disease: Secondary | ICD-10-CM | POA: Diagnosis not present

## 2021-09-30 DIAGNOSIS — Z992 Dependence on renal dialysis: Secondary | ICD-10-CM | POA: Diagnosis not present

## 2021-10-01 DIAGNOSIS — N186 End stage renal disease: Secondary | ICD-10-CM | POA: Diagnosis not present

## 2021-10-01 DIAGNOSIS — Z992 Dependence on renal dialysis: Secondary | ICD-10-CM | POA: Diagnosis not present

## 2021-10-02 ENCOUNTER — Encounter: Payer: Self-pay | Admitting: Internal Medicine

## 2021-10-02 ENCOUNTER — Inpatient Hospital Stay: Payer: Medicare HMO | Attending: Internal Medicine

## 2021-10-02 ENCOUNTER — Inpatient Hospital Stay: Payer: Medicare HMO

## 2021-10-02 ENCOUNTER — Inpatient Hospital Stay (HOSPITAL_BASED_OUTPATIENT_CLINIC_OR_DEPARTMENT_OTHER): Payer: Medicare HMO | Admitting: Internal Medicine

## 2021-10-02 ENCOUNTER — Other Ambulatory Visit: Payer: Self-pay

## 2021-10-02 DIAGNOSIS — D649 Anemia, unspecified: Secondary | ICD-10-CM | POA: Insufficient documentation

## 2021-10-02 DIAGNOSIS — D471 Chronic myeloproliferative disease: Secondary | ICD-10-CM | POA: Diagnosis not present

## 2021-10-02 DIAGNOSIS — R161 Splenomegaly, not elsewhere classified: Secondary | ICD-10-CM | POA: Diagnosis not present

## 2021-10-02 DIAGNOSIS — N186 End stage renal disease: Secondary | ICD-10-CM | POA: Insufficient documentation

## 2021-10-02 DIAGNOSIS — Z801 Family history of malignant neoplasm of trachea, bronchus and lung: Secondary | ICD-10-CM | POA: Diagnosis not present

## 2021-10-02 DIAGNOSIS — J432 Centrilobular emphysema: Secondary | ICD-10-CM | POA: Diagnosis not present

## 2021-10-02 DIAGNOSIS — I120 Hypertensive chronic kidney disease with stage 5 chronic kidney disease or end stage renal disease: Secondary | ICD-10-CM | POA: Insufficient documentation

## 2021-10-02 DIAGNOSIS — Z87891 Personal history of nicotine dependence: Secondary | ICD-10-CM | POA: Diagnosis not present

## 2021-10-02 DIAGNOSIS — J449 Chronic obstructive pulmonary disease, unspecified: Secondary | ICD-10-CM | POA: Diagnosis not present

## 2021-10-02 DIAGNOSIS — Z992 Dependence on renal dialysis: Secondary | ICD-10-CM | POA: Diagnosis not present

## 2021-10-02 DIAGNOSIS — Z803 Family history of malignant neoplasm of breast: Secondary | ICD-10-CM | POA: Insufficient documentation

## 2021-10-02 LAB — COMPREHENSIVE METABOLIC PANEL
ALT: 19 U/L (ref 0–44)
AST: 26 U/L (ref 15–41)
Albumin: 2.9 g/dL — ABNORMAL LOW (ref 3.5–5.0)
Alkaline Phosphatase: 50 U/L (ref 38–126)
Anion gap: 8 (ref 5–15)
BUN: 50 mg/dL — ABNORMAL HIGH (ref 8–23)
CO2: 22 mmol/L (ref 22–32)
Calcium: 6.7 mg/dL — ABNORMAL LOW (ref 8.9–10.3)
Chloride: 104 mmol/L (ref 98–111)
Creatinine, Ser: 7.29 mg/dL — ABNORMAL HIGH (ref 0.61–1.24)
GFR, Estimated: 8 mL/min — ABNORMAL LOW (ref >60)
Glucose, Bld: 82 mg/dL (ref 70–99)
Potassium: 4.2 mmol/L (ref 3.5–5.1)
Sodium: 134 mmol/L — ABNORMAL LOW (ref 135–145)
Total Bilirubin: 0.7 mg/dL (ref 0.3–1.2)
Total Protein: 5.5 g/dL — ABNORMAL LOW (ref 6.5–8.1)

## 2021-10-02 LAB — CBC WITH DIFFERENTIAL/PLATELET
Abs Immature Granulocytes: 0.02 10*3/uL (ref 0.00–0.07)
Basophils Absolute: 0 10*3/uL (ref 0.0–0.1)
Basophils Relative: 0 %
Eosinophils Absolute: 0 10*3/uL (ref 0.0–0.5)
Eosinophils Relative: 1 %
HCT: 26.2 % — ABNORMAL LOW (ref 39.0–52.0)
Hemoglobin: 8.5 g/dL — ABNORMAL LOW (ref 13.0–17.0)
Immature Granulocytes: 1 %
Lymphocytes Relative: 11 %
Lymphs Abs: 0.5 10*3/uL — ABNORMAL LOW (ref 0.7–4.0)
MCH: 29.8 pg (ref 26.0–34.0)
MCHC: 32.4 g/dL (ref 30.0–36.0)
MCV: 91.9 fL (ref 80.0–100.0)
Monocytes Absolute: 0.2 10*3/uL (ref 0.1–1.0)
Monocytes Relative: 4 %
Neutro Abs: 3.5 10*3/uL (ref 1.7–7.7)
Neutrophils Relative %: 83 %
Platelets: 106 10*3/uL — ABNORMAL LOW (ref 150–400)
RBC: 2.85 MIL/uL — ABNORMAL LOW (ref 4.22–5.81)
RDW: 15.7 % — ABNORMAL HIGH (ref 11.5–15.5)
WBC: 4.2 10*3/uL (ref 4.0–10.5)
nRBC: 0 % (ref 0.0–0.2)

## 2021-10-02 LAB — SAMPLE TO BLOOD BANK

## 2021-10-02 LAB — LACTATE DEHYDROGENASE: LDH: 504 U/L — ABNORMAL HIGH (ref 98–192)

## 2021-10-02 NOTE — Progress Notes (Signed)
Patient reports feeling tired.  Recently quit smoking during hospitalization.

## 2021-10-02 NOTE — Progress Notes (Signed)
[place] OFFICE PROGRESS NOTE  Patient Care Team: Valerie Roys, DO as PCP - General (Family Medicine) End, Harrell Gave, MD as PCP - Cardiology (Cardiology) Cammie Sickle, MD as Consulting Physician (Internal Medicine) Dasher, Rayvon Char, MD as Consulting Physician (Dermatology) End, Harrell Gave, MD as Consulting Physician (Cardiology) Anthonette Legato, MD (Nephrology)   Cancer Staging  No matching staging information was found for the patient.   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   # AUGUST 1st week 2019- Retacrit   # October 2nd 2019- jakafi 10 BID [? Renal involvement]; September 2020-peritoneal dialysis; Jakafi 10 mg in the morning; STOPPED/tapered [id number], 2021 [sec intolerance fatigue/myalgias]  # May 4th 2022- START [name] dosing/PD]; STOPPED after2-3 weeks-because of poor tolerance [sever fatigue/? 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 [name]- kidney Bx- FSG [ on Prednisone Dr.[name]]; July 2020-peritoneal dialysis;   # hx of Lung nodules- resolved [[name]]/quit smoking.  DIAGNOSIS: PRIMARY MYELOFIBROSIS  RISK:LOW     ;GOALS: Control  CURRENT/MOST RECENT THERAPY : Surveillance    Primary myelofibrosis ([place])      INTERVAL HISTORY:Ambulating independently.  He is accompanied by his wife.  [name] 66 y.o.  male pleasant patient above history of primary myelofibrosis Jak 2+ currently off [place] surveillance chronic kidney disease on peritoneal dialysis is here for follow-up.  Recently admitted for NSTEMI- sever 3 vessel CAD- transferred to [facility].  Patient underwent cardiac catheterization/stenting.  Is  currently home undergoing peritoneal dialysis.  Chronic fatigue.  Appetite is good.  Denies any early satiety.  No chills.  Review of Systems  Constitutional:  Positive for malaise/fatigue. Negative for chills, diaphoresis and fever.  HENT:  Negative for nosebleeds and sore throat.   Eyes:  Negative for double vision.  Respiratory:  Positive for cough and shortness of breath. Negative for hemoptysis, sputum production and wheezing.   Cardiovascular:  Negative for chest pain, palpitations and orthopnea.  Gastrointestinal:  Negative for abdominal pain, blood in stool, constipation, diarrhea, heartburn, melena, nausea and vomiting.  Genitourinary:  Negative for dysuria, frequency and urgency.  Musculoskeletal:  Positive for back pain and joint pain.  Skin: Negative.  Negative for itching and rash.  Neurological:  Positive for dizziness and tingling. Negative for focal weakness, weakness and headaches.  Endo/Heme/Allergies:  Does not bruise/bleed easily.  Psychiatric/Behavioral:  Negative for depression. The patient is not nervous/anxious and does not have insomnia.      PAST MEDICAL HISTORY :  Past Medical History:  Diagnosis Date   Anemia    Benign hypertensive renal disease    CAD (coronary artery disease)    a. 08/1996 s/p BMS to the mLAD ([facility]); b. 12/2017 MV: EF 46%, fixed apical defect w/ significant GI uptake artifact. No ischemia-> low risk; c. 07/2020 MV: EF 51%, no ischemia/infarct.   Carotid arterial disease ([place])    a. 11/2020 Carotid U/S: <50% bilat ICA stenoses.   CKD (chronic kidney disease), stage V ([place])    on peritoneal dialysis   Complication of anesthesia    please call by "[name]" when waking up!!   COPD (chronic obstructive pulmonary disease) (HCC)    centrilobular emphysema. pt is poorly controlled with his copd/smoking.   Depression  Diastolic dysfunction    a. 12/2017 Echo: EF 60-65%, no rwma, Gr1 DD, mild AI/MR. Nl RVSP; b. 08/2020 Echo: EF 60-65%, no rwma,  GrI DD, nl RV fxn. RVSP 32.62mHg.   Dyspnea on exertion    with exertion   Fatigue    FSGS (focal segmental glomerulosclerosis)    Hyperlipidemia 10/2002   Hypertension    myelofibrosis    bone marrow failure   [place]   JAK2 (+) primary   [place]ocardial infarction ([place]   stent x 1   Pneumoperitoneum 02/2021   PSVT (paroxysmal supraventricular tachycardia) ([place]    a. 12/2020 Zio: Sinus rhythm 80 (55-117). Rare PACs/PVCs. 17 episodes of SVT (longest 20.3 secs, fastest 184). No triggered events. No afib.   Smoker    Stroke ([facility]    a. 11/2020 MRI Brain: patchy acute/early subacute cortical infarcts w/in the R frontal, parietal, and occiptal lobes. Small, chronic L basal ganglia lacunar infarct.   Thrombocytopenia ([place]     PAST SURGICAL HISTORY :   Past Surgical History:  Procedure Laterality Date   ANGIOPLASTY     BONE MARROW ASPIRATION  03/2009   CARDIAC CATHETERIZATION  1997   stent x 1   COLONOSCOPY WITH PROPOFOL N/A 08/09/2018   Procedure: COLONOSCOPY WITH PROPOFOL;  Surgeon: AJonathon Bellows MD;  [facility];  Service: Gastroenterology;  Laterality: N/A;   CORONARY ARTERY BYPASS GRAFT     CORONARY STENT PLACEMENT  08/1996   ESOPHAGOGASTRODUODENOSCOPY (EGD) WITH PROPOFOL N/A 07/25/2021   Procedure: ESOPHAGOGASTRODUODENOSCOPY (EGD) WITH PROPOFOL;  Surgeon: AJonathon Bellows MD;  [facility];  Service: Gastroenterology;  Laterality: N/A;   EXTERIORIZATION OF A CONTINUOUS AMBULATORY PERITONEAL DIALYSIS CATHETER     EYE SURGERY Bilateral    cats removed   LEFT HEART CATH AND CORONARY ANGIOGRAPHY N/A 09/12/2021   Procedure: LEFT HEART CATH AND CORONARY ANGIOGRAPHY;  Surgeon: AWellington Hampshire MD;  [facility];  Service: Cardiovascular;  Laterality: N/A;   REVERSE SHOULDER ARTHROPLASTY Left 11/19/2020   Procedure: Left reverse shoulder arthroplasty;  Surgeon: PLeim Fabry MD;  Location: [facility] ORS;  Service: Orthopedics;   Laterality: Left;    FAMILY HISTORY :   Family History  Problem Relation Age of Onset   Stroke Mother        Low BP stroke   Depression Father    Depression Sister        Breast   Heart disease Other    Breast cancer Other    Lung cancer Other    Ovarian cancer Other    Stomach cancer Other     SOCIAL HISTORY:   Social History   Tobacco Use   Smoking status: Former    Packs/day: 0.50    Years: 47.00    Pack years: 23.50    Types: Cigarettes    Quit date: 09/11/2021    Years since quitting: 0.0   Smokeless tobacco: Never   Tobacco comments:    trying to cut back-smokes 6 to 7 cigs per day  Vaping Use   Vaping Use: Never used  Substance Use Topics   Alcohol use: Not Currently    Comment: "6 beers weekly on Saturdays"   Drug use: No    ALLERGIES:  has No Known Allergies.  MEDICATIONS:  Current Outpatient Medications  Medication Sig Dispense Refill   albuterol (VENTOLIN HFA) 108 (90 Base) MCG/ACT inhaler Inhale 2 puffs into the lungs every 6 (six) hours as needed for wheezing or shortness  of breath. 8 g 2   aspirin 81 MG chewable tablet Chew 1 tablet (81 mg total) by mouth daily. 90 tablet 1   atorvastatin (LIPITOR) 40 MG tablet Take 1 tablet (40 mg total) by mouth daily.     carvedilol (COREG) 25 MG tablet Take 1 tablet (25 mg total) by mouth 2 (two) times daily. 180 tablet 3   Cholecalciferol (VITAMIN D-1000 MAX ST) 25 MCG (1000 UT) tablet Take 1,000 Units by mouth daily.      citalopram (CELEXA) 40 MG tablet Take 1.5 tablets (60 mg total) by mouth daily. 135 tablet 1   clopidogrel (PLAVIX) 75 MG tablet Take 75 mg by mouth daily.     ferrous sulfate 325 (65 FE) MG tablet Take 1 tablet (325 mg total) by mouth daily with breakfast. 30 tablet 1   furosemide (LASIX) 20 MG tablet Take 1 tablet (20 mg total) by mouth daily. 90 tablet 1   gentamicin cream (GARAMYCIN) 0.1 % Apply 1 application topically every other day.     levETIRAcetam (KEPPRA) 500 MG tablet Take 1  tablet (500 mg total) by mouth at bedtime. 30 tablet 0   nitroGLYCERIN (NITROSTAT) 0.4 MG SL tablet Place 1 tablet (0.4 mg total) under the tongue every 5 (five) minutes as needed for chest pain.  12   nortriptyline (PAMELOR) 25 MG capsule Take 1 capsule (25 mg total) by mouth at bedtime. 30 capsule 3   ondansetron (ZOFRAN ODT) 4 MG disintegrating tablet Take 1 tablet (4 mg total) by mouth every 8 (eight) hours as needed for nausea or vomiting. 12 tablet 0   pantoprazole (PROTONIX) 20 MG tablet Take 20 mg by mouth daily.     polyethylene glycol (MIRALAX / GLYCOLAX) 17 g packet Take 17 g by mouth daily as needed. 14 each 0   predniSONE (DELTASONE) 10 MG tablet Take 10 mg by mouth daily with breakfast.     tamsulosin (FLOMAX) 0.4 MG CAPS capsule Take 0.4 mg by mouth daily.     heparin 25000 UT/250ML infusion Inject 1,200 Units/hr into the vein continuous. (Patient not taking: Reported on 10/02/2021)     nicotine (NICODERM CQ - DOSED IN MG/24 HOURS) 21 mg/24hr patch Place 1 patch (21 mg total) onto the skin daily. (Patient not taking: Reported on 10/02/2021)     No current facility-administered medications for this visit.    PHYSICAL EXAMINATION: ECOG PERFORMANCE STATUS: 1 - Symptomatic but completely ambulatory  BP 133/85    Pulse 77    Temp 98.9 F (37.2 C)    Resp 16    Wt 151 lb (68.5 kg)    BMI 23.65 kg/m   Filed Weights   10/02/21 0855  Weight: 151 lb (68.5 kg)    Physical Exam HENT:     Head: Normocephalic and atraumatic.     Mouth/Throat:     Pharynx: No oropharyngeal exudate.  Eyes:     Pupils: Pupils are equal, round, and reactive to light.  Cardiovascular:     Rate and Rhythm: Normal rate and regular rhythm.  Pulmonary:     Effort: No respiratory distress.     Breath sounds: No wheezing.     Comments: Decreased air entry bilaterally. Abdominal:     General: Bowel sounds are normal. There is no distension.     Palpations: Abdomen is soft. There is no mass.      Tenderness: There is no abdominal tenderness. There is no guarding or rebound.     Comments:  Positive for splenomegaly.  Musculoskeletal:        General: No tenderness. Normal range of motion.     Cervical back: Normal range of motion and neck supple.  Skin:    General: Skin is warm.  Neurological:     Mental Status: He is alert and oriented to person, place, and time.     Comments: Left upper extremity weakness noted.    Psychiatric:        Mood and Affect: Affect normal.    LABORATORY DATA:  I have reviewed the data as listed    Component Value Date/Time   NA 134 (L) 10/02/2021 0828   NA 140 07/10/2021 0828   K 4.2 10/02/2021 0828   CL 104 10/02/2021 0828   CO2 22 10/02/2021 0828   GLUCOSE 82 10/02/2021 0828   BUN 50 (H) 10/02/2021 0828   BUN 41 (H) 07/10/2021 0828   CREATININE 7.29 (H) 10/02/2021 0828   CREATININE 1.12 09/30/2013 0953   CALCIUM 6.7 (L) 10/02/2021 0828   PROT 5.5 (L) 10/02/2021 0828   PROT 5.5 (L) 07/10/2021 0828   ALBUMIN 2.9 (L) 10/02/2021 0828   ALBUMIN 3.6 (L) 07/10/2021 0828   AST 26 10/02/2021 0828   ALT 19 10/02/2021 0828   ALKPHOS 50 10/02/2021 0828   BILITOT 0.7 10/02/2021 0828   BILITOT 0.3 07/10/2021 0828   GFRNONAA 8 (L) 10/02/2021 0828   GFRNONAA >60 09/30/2013 0953   GFRAA 14 (L) 07/10/2020 0909   GFRAA >60 09/30/2013 0953    No results found for: SPEP, UPEP  Lab Results  Component Value Date   WBC 4.2 10/02/2021   NEUTROABS 3.5 10/02/2021   HGB 8.5 (L) 10/02/2021   HCT 26.2 (L) 10/02/2021   MCV 91.9 10/02/2021   PLT 106 (L) 10/02/2021      Chemistry      Component Value Date/Time   NA 134 (L) 10/02/2021 0828   NA 140 07/10/2021 0828   K 4.2 10/02/2021 0828   CL 104 10/02/2021 0828   CO2 22 10/02/2021 0828   BUN 50 (H) 10/02/2021 0828   BUN 41 (H) 07/10/2021 0828   CREATININE 7.29 (H) 10/02/2021 0828   CREATININE 1.12 09/30/2013 0953      Component Value Date/Time   CALCIUM 6.7 (L) 10/02/2021 0828   ALKPHOS 50  10/02/2021 0828   AST 26 10/02/2021 0828   ALT 19 10/02/2021 0828   BILITOT 0.7 10/02/2021 0828   BILITOT 0.3 07/10/2021 0828       RADIOGRAPHIC STUDIES: I have personally reviewed the radiological images as listed and agreed with the findings in the report. No results found.   ASSESSMENT & PLAN:  Primary myelofibrosis ([place]) # Primary Myelofiborisis [Bone marrow 2010] jak-2 POSITIVE;currently on surveillance; Patient discontinued Jakafi-because of poor tolerance.     # OCT 1st, 2022-US Splenomegaly with increase in splenic size from prior imaging, current splenic volume 1024cc; patient symptomatic.  Discussed regarding use of Hydrea decrease size of spleen. Recommend Hydrea 500 mg once a day; pt/wife- decline given the concerns for side effects.   #Non-STEMI/ischemic cardiomyopathy-s/p stenting clinically stable.  #Anemia hemoglobin- multifactorial is 8.5.  Continue EPO stimulating agent /IV iron as per nephrology. HOLD PRBC transfusion.   #COPD-poorly controlled; declines PFTs;on prednisone 10 mg a day- see below-  STABLE;   #End-stage renal disease [Dr. Latif]  on PD-STABLE;   I spoke at length with the patient's wife- regarding the patient's clinical status/plan of care.  Family agreement.  Discussed  with the wife that unfortunately-no optimal therapeutic options available for the patient given his multiple comorbidities/and in general reluctant with medications/concern for side effects.  For now we will continue the supportive care with transfusions as needed.  # DISPOSITION: mychart apt. # HOLD PRBC transfusion # follow up in 1 month-labs- MD-CBC/CMP;LDH;HOLD tube; posssible1 unit PRBC tranfusion- Dr.B  No orders of the defined types were placed in this encounter.   All questions were answered. The patient knows to call the clinic with any problems, questions or concerns.     Cammie Sickle, MD 10/02/2021 9:28 AM

## 2021-10-02 NOTE — Assessment & Plan Note (Addendum)
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     # OCT 1st, 2022-US Splenomegaly with increase in splenic size from prior imaging, current splenic volume 1024cc; patient symptomatic.  Discussed regarding use of Hydrea decrease size of spleen. Recommend Hydrea 500 mg once a day; pt/wife- decline given the concerns for side effects.   #Non-STEMI/ischemic cardiomyopathy-s/p stenting clinically stable.  #Anemia hemoglobin- multifactorial is 8.5.  Continue EPO stimulating agent /IV iron as per nephrology. HOLD PRBC transfusion.   #COPD-poorly controlled; declines PFTs;on prednisone 10 mg a day- see below-  STABLE;   #End-stage renal disease [Dr. Latif]  on PD-STABLE;   I spoke at length with the patient's wife- regarding the patient's clinical status/plan of care.  Family agreement.  Discussed with the wife that unfortunately-no optimal therapeutic options available for the patient given his multiple comorbidities/and in general reluctant with medications/concern for side effects.  For now we will continue the supportive care with transfusions as needed.  # DISPOSITION: mychart apt. # HOLD PRBC transfusion # follow up in 1 month-labs- MD-CBC/CMP;LDH;HOLD tube; posssible1 unit PRBC tranfusion- Dr.B

## 2021-10-03 DIAGNOSIS — N186 End stage renal disease: Secondary | ICD-10-CM | POA: Diagnosis not present

## 2021-10-03 DIAGNOSIS — Z992 Dependence on renal dialysis: Secondary | ICD-10-CM | POA: Diagnosis not present

## 2021-10-04 ENCOUNTER — Ambulatory Visit: Payer: Medicaid Other | Admitting: Family Medicine

## 2021-10-04 ENCOUNTER — Encounter: Payer: Self-pay | Admitting: Internal Medicine

## 2021-10-04 DIAGNOSIS — Z992 Dependence on renal dialysis: Secondary | ICD-10-CM | POA: Diagnosis not present

## 2021-10-04 DIAGNOSIS — N186 End stage renal disease: Secondary | ICD-10-CM | POA: Diagnosis not present

## 2021-10-06 DIAGNOSIS — N186 End stage renal disease: Secondary | ICD-10-CM | POA: Diagnosis not present

## 2021-10-06 DIAGNOSIS — Z992 Dependence on renal dialysis: Secondary | ICD-10-CM | POA: Diagnosis not present

## 2021-10-07 DIAGNOSIS — N186 End stage renal disease: Secondary | ICD-10-CM | POA: Diagnosis not present

## 2021-10-07 DIAGNOSIS — Z992 Dependence on renal dialysis: Secondary | ICD-10-CM | POA: Diagnosis not present

## 2021-10-08 DIAGNOSIS — Z992 Dependence on renal dialysis: Secondary | ICD-10-CM | POA: Diagnosis not present

## 2021-10-08 DIAGNOSIS — N186 End stage renal disease: Secondary | ICD-10-CM | POA: Diagnosis not present

## 2021-10-09 ENCOUNTER — Encounter: Payer: Self-pay | Admitting: Internal Medicine

## 2021-10-09 DIAGNOSIS — N186 End stage renal disease: Secondary | ICD-10-CM | POA: Diagnosis not present

## 2021-10-09 DIAGNOSIS — Z992 Dependence on renal dialysis: Secondary | ICD-10-CM | POA: Diagnosis not present

## 2021-10-09 NOTE — Progress Notes (Signed)
Follow-up Outpatient Visit Date: 10/10/2021  Primary Care Provider: Colum, Colt, DO [place] [id number]  Chief Complaint: Follow-up heart failure and coronary artery disease  HPI:  James Arnold is a 66 y.o. male with history of coronary artery disease status post remote PCI to the LAD, HFpEF, hypertension, hyperlipidemia, stroke, Edu On-stage renal disease on peritoneal dialysis, COPD, and myelofibrosis, who presents for follow-up of coronary artery disease.  He was last seen in our office in September by James Bayley, NP, at which time he reported numbness and tingling in his left side though his strength improved following preceding stroke.  He did not have any chest pain.  Chronic exertional dyspnea was stable in the setting of continued 1 pack/day smoking.  He continued to decline statin therapy.  He also did not wish to proceed with TEE and ILR for work-up of his stroke.  He was hospitalized last month with an NSTEMI with chest pain and worsening shortness of breath.  Catheterization showed severe three-vessel CAD.  He was transferred to [facility] at his request, where he underwent PCI to LAD and dominant LCx.  Today, James Arnold reports that he is feeling better than prior to his recent hospitalization.  However, he still feels quite fatigued.  He is only able to do light activities around the house for about 10 minutes before he needs to stop and rest due to exertional dyspnea.  He has not had any further chest pain.  He notes occasional orthopnea but no PND.  He remains compliant with peritoneal dialysis and also makes some urine.  He has noticed dark stools over the last couple weeks but no bright red blood per rectum.  He has intermittent dizziness that he describes as vertigo, which has been longstanding.  He denies syncope or falls.  He is concerned about an itchy rash on his abdomen that began after his recent hospitalization.  Only new medication was  atorvastatin.  --------------------------------------------------------------------------------------------------  Past Medical History:  Diagnosis Date   Anemia    Benign hypertensive renal disease    CAD (coronary artery disease)    a. 08/1996 s/p BMS to the mLAD ([facility]); b. 12/2017 MV: EF 46%, fixed apical defect w/ significant GI uptake artifact. No ischemia-> low risk; c. 07/2020 MV: EF 51%, no ischemia/infarct; d. 3-vessel disease by [facility] 08/2021; e. complex PCI LAD and dominant LCx at [facility] (08/2021)   Carotid arterial disease ([place])    a. 11/2020 Carotid U/S: <50% bilat ICA stenoses.   CKD (chronic kidney disease), stage V ([facility])    on peritoneal dialysis   Complication of anesthesia    please call by "[name]" when waking up!!   COPD (chronic obstructive pulmonary disease) (HCC)    centrilobular emphysema. pt is poorly controlled with his copd/smoking.   Depression    Diastolic dysfunction    a. 12/2017 Echo: EF 60-65%, no rwma, Gr1 DD, mild AI/MR. Nl RVSP; b. 08/2020 Echo: EF 60-65%, no rwma, GrI DD, nl RV fxn. RVSP 32.43mmHg.   Dyspnea on exertion    with exertion   Fatigue    FSGS (focal segmental glomerulosclerosis)    Hyperlipidemia 10/2002   Hypertension    myelofibrosis    bone marrow failure   Myelofibrosis ([place]) 2010   JAK2 (+) primary   Myocardial infarction ([facility]) 1997   stent x 1   Pneumoperitoneum 02/2021   PSVT (paroxysmal supraventricular tachycardia) ([place])    a. 12/2020 Zio: Sinus rhythm 80 (55-117). Rare PACs/PVCs.  17 episodes of SVT (longest 20.3 secs, fastest 184). No triggered events. No afib.   Smoker    Stroke [facility])    a. 11/2020 MRI Brain: patchy acute/early subacute cortical infarcts w/in the R frontal, parietal, and occiptal lobes. Small, chronic L basal ganglia lacunar infarct.   Thrombocytopenia [facility])    Past Surgical History:  Procedure Laterality Date   ANGIOPLASTY     BONE MARROW ASPIRATION  03/2009   CARDIAC CATHETERIZATION  1997   stent x 1    COLONOSCOPY WITH PROPOFOL N/A 08/09/2018   Procedure: COLONOSCOPY WITH PROPOFOL;  Surgeon: [name], MD;  Location[facility] ENDOSCOPY;  Service: Gastroenterology;  Laterality: N/A;   CORONARY ARTERY BYPASS GRAFT     CORONARY STENT PLACEMENT  08/1996   ESOPHAGOGASTRODUODENOSCOPY (EGD) WITH PROPOFOL N/A 07/25/2021   Procedure: ESOPHAGOGASTRODUODENOSCOPY (EGD) WITH PROPOFOL;  Surgeon: [name], MD;  Location: [facility] ENDOSCOPY;  Service: Gastroenterology;  Laterality: N/A;   EXTERIORIZATION OF A CONTINUOUS AMBULATORY PERITONEAL DIALYSIS CATHETER     EYE SURGERY Bilateral    cats removed   LEFT HEART CATH AND CORONARY ANGIOGRAPHY N/A 09/12/2021   Procedure: LEFT HEART CATH AND CORONARY ANGIOGRAPHY;  Surgeon: Wellington Hampshire, MD;  Location: [facility] CV LAB;  Service: Cardiovascular;  Laterality: N/A;   REVERSE SHOULDER ARTHROPLASTY Left 11/19/2020   Procedure: Left reverse shoulder arthroplasty;  Surgeon: Leim Fabry, MD;  Location: [facility] ORS;  Service: Orthopedics;  Laterality: Left;    Current Meds  Medication Sig   albuterol (VENTOLIN HFA) 108 (90 Base) MCG/ACT inhaler Inhale 2 puffs into the lungs every 6 (six) hours as needed for wheezing or shortness of breath.   aspirin 81 MG chewable tablet Chew 1 tablet (81 mg total) by mouth daily.   atorvastatin (LIPITOR) 40 MG tablet Take 1 tablet (40 mg total) by mouth daily.   carvedilol (COREG) 25 MG tablet Take 1 tablet (25 mg total) by mouth 2 (two) times daily.   Cholecalciferol (VITAMIN D-1000 MAX ST) 25 MCG (1000 UT) tablet Take 1,000 Units by mouth daily.    citalopram (CELEXA) 40 MG tablet Take 1.5 tablets (60 mg total) by mouth daily.   clopidogrel (PLAVIX) 75 MG tablet Take 75 mg by mouth daily.   ferrous sulfate 325 (65 FE) MG tablet Take 1 tablet (325 mg total) by mouth daily with breakfast.   furosemide (LASIX) 20 MG tablet Take 1 tablet (20 mg total) by mouth daily.   gentamicin cream (GARAMYCIN) 0.1 % Apply 1 application  topically every other day.   levETIRAcetam (KEPPRA) 500 MG tablet Take 1 tablet (500 mg total) by mouth at bedtime.   nitroGLYCERIN (NITROSTAT) 0.4 MG SL tablet Place 1 tablet (0.4 mg total) under the tongue every 5 (five) minutes as needed for chest pain.   nortriptyline (PAMELOR) 25 MG capsule Take 1 capsule (25 mg total) by mouth at bedtime.   ondansetron (ZOFRAN ODT) 4 MG disintegrating tablet Take 1 tablet (4 mg total) by mouth every 8 (eight) hours as needed for nausea or vomiting.   pantoprazole (PROTONIX) 20 MG tablet Take 20 mg by mouth daily.   polyethylene glycol (MIRALAX / GLYCOLAX) 17 g packet Take 17 g by mouth daily as needed.   predniSONE (DELTASONE) 10 MG tablet Take 10 mg by mouth daily with breakfast.   tamsulosin (FLOMAX) 0.4 MG CAPS capsule Take 0.4 mg by mouth daily.    Allergies: Patient has no known allergies.  Social History   Tobacco Use   Smoking  status: Former    Packs/day: 0.50    Years: 47.00    Pack years: 23.50    Types: Cigarettes    Quit date: 09/11/2021    Years since quitting: 0.0   Smokeless tobacco: Never   Tobacco comments:    trying to cut back-smokes 6 to 7 cigs per day  Vaping Use   Vaping Use: Never used  Substance Use Topics   Alcohol use: Not Currently    Comment: "6 beers weekly on Saturdays"-quit in July 2022   Drug use: No    Family History  Problem Relation Age of Onset   Stroke Mother        Low BP stroke   Depression Father    Depression Sister        Breast   Heart disease Other    Breast cancer Other    Lung cancer Other    Ovarian cancer Other    Stomach cancer Other     Review of Systems: A 12-system review of systems was performed and was negative except as noted in the HPI.  --------------------------------------------------------------------------------------------------  Physical Exam: BP 126/74 (BP Location: Left Arm, Patient Position: Sitting, Cuff Size: Normal)    Pulse 86    Ht 5\' 7"  (1.702 m)    Wt  150 lb (68 kg)    SpO2 98%    BMI 23.49 kg/m   General:  NAD. Neck: No JVD or HJR. Lungs: Clear to auscultation bilaterally without wheezes or crackles. Heart: Regular rate and rhythm without murmurs, rubs, or gallops. Abdomen: Soft, nontender, nondistended.  Peritoneal dialysis catheter in place. Extremities: No lower extremity edema. Skin: Macular rash noted on abdomen.  EKG:  Normal sinus rhythm with LVH and nonspecific ST/T changes.  Compared to 09/13/2021, anterolateral ST depressions are less pronounced.  Lab Results  Component Value Date   WBC 4.2 10/02/2021   HGB 8.5 (L) 10/02/2021   HCT 26.2 (L) 10/02/2021   MCV 91.9 10/02/2021   PLT 106 (L) 10/02/2021    Lab Results  Component Value Date   NA 134 (L) 10/02/2021   K 4.2 10/02/2021   CL 104 10/02/2021   CO2 22 10/02/2021   BUN 50 (H) 10/02/2021   CREATININE 7.29 (H) 10/02/2021   GLUCOSE 82 10/02/2021   ALT 19 10/02/2021    Lab Results  Component Value Date   CHOL 160 07/10/2021   HDL 28 (L) 07/10/2021   LDLCALC 110 (H) 07/10/2021   TRIG 120 07/10/2021   CHOLHDL 5.7 11/21/2020    --------------------------------------------------------------------------------------------------  ASSESSMENT AND PLAN: NSTEMI: James Arnold status post two-vessel PCI at [place] last month in the setting of NSTEMI.  He has not had any further chest pain though he continues to have shortness of breath.  He will need to remain on dual antiplatelet therapy with aspirin and clopidogrel for at least 12 months as long as he does not have significant bleeding.  I am somewhat concerned by his report of dark stools and fatigue in the setting of chronic anemia.  We will check a CBC today.  He will need to be on high intensity statin therapy for secondary prevention, though given new rash after adding atorvastatin, we will hold this medication to see if his symptoms improve.  Chronic HFrEF: LVEF moderately reduced on echo during recent  hospitalization (LVEF 35-40%).  He appears euvolemic on exam with NYHA class III symptoms.  Currently, James Arnold is only on carvedilol for GDMT.  Given his fatigue, I  have recommended decreasing carvedilol to 12.5 mg twice daily.  We will challenge him with losartan 12.5 mg daily.  Spironolactone and SGLT2 inhibitors are contraindicated in the setting of renal failure.  I will reach out to his nephrologist, Dr. Zollie Scale, to make sure that he is in agreement with trial of losartan, given James Arnold's kidney failure on peritoneal dialysis.  I will check a BMP today.  Continue low-dose furosemide as well as volume management through PD.  Delanda Bulluck-stage renal disease: Continue PD and furosemide with close follow-up of electrolytes in the setting of adding losartan.  Chronic anemia and myelofibrosis: Concern for melena in the setting of DAPT with dark tarry stools and fatigue.  We will check a CBC today.  Continue pantoprazole.  Stroke: James Arnold previously declined TEE and ILR placement.  Defer further testing at this time.  Continue DAPT, as above, as well as aggressive lipid therapy (holding atorvastatin for now to see if pruritus/rash improve).  Follow-up: Return to clinic in 1 month.  Nelva Bush, MD 10/10/2021 1:48 PM

## 2021-10-10 ENCOUNTER — Other Ambulatory Visit: Payer: Self-pay

## 2021-10-10 ENCOUNTER — Ambulatory Visit (INDEPENDENT_AMBULATORY_CARE_PROVIDER_SITE_OTHER): Payer: Medicare HMO | Admitting: Internal Medicine

## 2021-10-10 ENCOUNTER — Encounter: Payer: Self-pay | Admitting: Internal Medicine

## 2021-10-10 VITALS — BP 126/74 | HR 86 | Ht 67.0 in | Wt 150.0 lb

## 2021-10-10 DIAGNOSIS — I214 Non-ST elevation (NSTEMI) myocardial infarction: Secondary | ICD-10-CM | POA: Diagnosis not present

## 2021-10-10 DIAGNOSIS — Z8673 Personal history of transient ischemic attack (TIA), and cerebral infarction without residual deficits: Secondary | ICD-10-CM

## 2021-10-10 DIAGNOSIS — Z992 Dependence on renal dialysis: Secondary | ICD-10-CM | POA: Diagnosis not present

## 2021-10-10 DIAGNOSIS — D649 Anemia, unspecified: Secondary | ICD-10-CM

## 2021-10-10 DIAGNOSIS — I5022 Chronic systolic (congestive) heart failure: Secondary | ICD-10-CM | POA: Diagnosis not present

## 2021-10-10 DIAGNOSIS — I2510 Atherosclerotic heart disease of native coronary artery without angina pectoris: Secondary | ICD-10-CM

## 2021-10-10 DIAGNOSIS — N186 End stage renal disease: Secondary | ICD-10-CM

## 2021-10-10 DIAGNOSIS — I10 Essential (primary) hypertension: Secondary | ICD-10-CM | POA: Diagnosis not present

## 2021-10-10 DIAGNOSIS — D7581 Myelofibrosis: Secondary | ICD-10-CM

## 2021-10-10 MED ORDER — CARVEDILOL 12.5 MG PO TABS: 12.5000 mg | ORAL_TABLET | Freq: Two times a day (BID) | ORAL | 3 refills | Status: DC

## 2021-10-10 MED ORDER — LOSARTAN POTASSIUM 25 MG PO TABS: 12.5000 mg | ORAL_TABLET | Freq: Every day | ORAL | 1 refills | Status: DC

## 2021-10-10 NOTE — Patient Instructions (Signed)
Medication Instructions:  - Your physician has recommended you make the following change in your medication:   1) STOP Atorvastatin  2) DECREASE Carvedilol to 12.5 mg: - take 1 tablet by mouth TWICE daily  3) START Losartan 25 mg: - take 0.5 tablet (12.5 mg) by mouth ONCE daily   *If you need a refill on your cardiac medications before your next appointment, please call your pharmacy*   Lab Work: - Your physician recommends that you have lab work today: BMP/ CBC  If you have labs (blood work) drawn today and your tests are completely normal, you will receive your results only by: [place] (if you have [place]) OR A paper copy in the mail If you have any lab test that is abnormal or we need to change your treatment, we will call you to review the results.   Testing/Procedures: - none ordered   Follow-Up: At [facility], you and your health needs are our priority.  As part of our continuing mission to provide you with exceptional heart care, we have created designated Provider Care Teams.  These Care Teams include your primary Cardiologist (physician) and Advanced Practice Providers (APPs -  Physician Assistants and Nurse Practitioners) who all work together to provide you with the care you need, when you need it.  We recommend signing up for the patient portal called "MyChart".  Sign up information is provided on this After Visit Summary.  MyChart is used to connect with patients for Virtual Visits (Telemedicine).  Patients are able to view lab/test results, encounter notes, upcoming appointments, etc.  Non-urgent messages can be sent to your provider as well.   To learn more about what you can do with MyChart, go to [email or web address].    Your next appointment:   1 month(s)  The format for your next appointment:   In Person  Provider:   You may see Nelva Bush, MD or one of the following Advanced Practice Providers on your designated Care Team:    Murray Hodgkins, NP Christell Faith, PA-C Cadence Kathlen Mody, [place]    Other Instructions  Losartan Tablets What is this medication? LOSARTAN (loe SAR tan) treats high blood pressure. It may also be used to prevent a stroke in people with heart disease and high blood pressure. It can be used to prevent kidney damage in people with diabetes. It works by relaxing the blood vessels, which helps decrease the amount of work your heart has to do. It belongs to a group of medications called ARBs. This medicine may be used for other purposes; ask your health care provider or pharmacist if you have questions. COMMON BRAND NAME(S): Cozaar What should I tell my care team before I take this medication? They need to know if you have any of these conditions: Heart failure Kidney disease Liver disease An unusual or allergic reaction to losartan, other medications, foods, dyes, or preservatives Pregnant or trying to get pregnant Breast-feeding How should I use this medication? Take this medication by mouth. Take it as directed on the prescription label at the same time every day. You can take it with or without food. If it upsets your stomach, take it with food. Keep taking it unless your care team tells you to stop. Talk to your care team about the use of this medication in children. While it may be prescribed for children as young as 6 for selected conditions, precautions do apply. Overdosage: If you think you have taken too much of this medicine contact a  poison control center or emergency room at once. NOTE: This medicine is only for you. Do not share this medicine with others. What if I miss a dose? If you miss a dose, take it as soon as you can. If it is almost time for your next dose, take only that dose. Do not take double or extra doses. What may interact with this medication? Aliskiren ACE inhibitors, like enalapril or lisinopril Diuretics, especially amiloride, eplerenone, spironolactone, or  triamterene Lithium NSAIDs, medications for pain and inflammation, like ibuprofen or naproxen Potassium salts or potassium supplements This list may not describe all possible interactions. Give your health care provider a list of all the medicines, herbs, non-prescription drugs, or dietary supplements you use. Also tell them if you smoke, drink alcohol, or use illegal drugs. Some items may interact with your medicine. What should I watch for while using this medication? Visit your care team for regular check ups. Check your blood pressure as directed. Ask your care team what your blood pressure should be. Also, find out when you should contact them. Do not treat yourself for coughs, colds, or pain while you are using this medication without asking your care team for advice. Some medications may increase your blood pressure. Women should inform their care team if they wish to become pregnant or think they might be pregnant. There is a potential for serious side effects to an unborn child. Talk to your care team for more information. You may get drowsy or dizzy. Do not drive, use machinery, or do anything that needs mental alertness until you know how this medication affects you. Do not stand or sit up quickly, especially if you are an older patient. This reduces the risk of dizzy or fainting spells. Alcohol can make you more drowsy and dizzy. Avoid alcoholic drinks. Avoid salt substitutes unless you are told otherwise by your care team. What side effects may I notice from receiving this medication? Side effects that you should report to your care team as soon as possible: Allergic reactions--skin rash, itching, hives, swelling of the face, lips, tongue, or throat High potassium level--muscle weakness, fast or irregular heartbeat Kidney injury--decrease in the amount of urine, swelling of the ankles, hands, or feet Low blood pressure--dizziness, feeling faint or lightheaded, blurry vision Side effects  that usually do not require medical attention (report to your care team if they continue or are bothersome): Dizziness Headache Runny or stuffy nose This list may not describe all possible side effects. Call your doctor for medical advice about side effects. You may report side effects to FDA at 1-800-FDA-1088. Where should I keep my medication? Keep out of the reach of children and pets. Store at room temperature between 20 and 25 degrees C (68 and 77 degrees F). Protect from light. Keep the container tightly closed. Get rid of any unused medication after the expiration date. To get rid of medications that are no longer needed or have expired: Take the medication to a medication take-back program. Check with your pharmacy or law enforcement to find a location. If you cannot return the medication, check the label or package insert to see if the medication should be thrown out in the garbage or flushed down the toilet. If you are not sure, ask your care team. If it is safe to put in the trash, empty the medication out of the container. Mix the medication with cat litter, dirt, coffee grounds, or other unwanted substance. Seal the mixture in a bag or container.  Put it in the trash. NOTE: This sheet is a summary. It may not cover all possible information. If you have questions about this medicine, talk to your doctor, pharmacist, or health care provider.  2022 Elsevier/Gold Standard (2021-05-28 00:00:00)

## 2021-10-11 DIAGNOSIS — N186 End stage renal disease: Secondary | ICD-10-CM | POA: Diagnosis not present

## 2021-10-11 DIAGNOSIS — Z992 Dependence on renal dialysis: Secondary | ICD-10-CM | POA: Diagnosis not present

## 2021-10-11 LAB — BASIC METABOLIC PANEL
BUN/Creatinine Ratio: 8 — ABNORMAL LOW (ref 10–24)
BUN: 55 mg/dL — ABNORMAL HIGH (ref 8–27)
CO2: 19 mmol/L — ABNORMAL LOW (ref 20–29)
Calcium: 7.4 mg/dL — ABNORMAL LOW (ref 8.6–10.2)
Chloride: 104 mmol/L (ref 96–106)
Creatinine, Ser: 6.91 mg/dL — ABNORMAL HIGH (ref 0.76–1.27)
Glucose: 109 mg/dL — ABNORMAL HIGH (ref 70–99)
Potassium: 4.8 mmol/L (ref 3.5–5.2)
Sodium: 144 mmol/L (ref 134–144)
eGFR: 8 mL/min/{1.73_m2} — ABNORMAL LOW (ref >59)

## 2021-10-11 LAB — CBC
Hematocrit: 24.7 % — ABNORMAL LOW (ref 37.5–51.0)
Hemoglobin: 8.1 g/dL — ABNORMAL LOW (ref 13.0–17.7)
MCH: 29.1 pg (ref 26.6–33.0)
MCHC: 32.8 g/dL (ref 31.5–35.7)
MCV: 89 fL (ref 79–97)
Platelets: 153 10*3/uL (ref 150–450)
RBC: 2.78 x10E6/uL — ABNORMAL LOW (ref 4.14–5.80)
RDW: 15.3 % (ref 11.6–15.4)
WBC: 4.5 10*3/uL (ref 3.4–10.8)

## 2021-10-13 DIAGNOSIS — N186 End stage renal disease: Secondary | ICD-10-CM | POA: Diagnosis not present

## 2021-10-13 DIAGNOSIS — Z992 Dependence on renal dialysis: Secondary | ICD-10-CM | POA: Diagnosis not present

## 2021-10-14 DIAGNOSIS — N186 End stage renal disease: Secondary | ICD-10-CM | POA: Diagnosis not present

## 2021-10-14 DIAGNOSIS — Z992 Dependence on renal dialysis: Secondary | ICD-10-CM | POA: Diagnosis not present

## 2021-10-15 DIAGNOSIS — N186 End stage renal disease: Secondary | ICD-10-CM | POA: Diagnosis not present

## 2021-10-15 DIAGNOSIS — Z992 Dependence on renal dialysis: Secondary | ICD-10-CM | POA: Diagnosis not present

## 2021-10-16 ENCOUNTER — Ambulatory Visit (INDEPENDENT_AMBULATORY_CARE_PROVIDER_SITE_OTHER): Payer: Medicare HMO | Admitting: Family Medicine

## 2021-10-16 ENCOUNTER — Encounter: Payer: Self-pay | Admitting: Family Medicine

## 2021-10-16 ENCOUNTER — Ambulatory Visit: Payer: Medicare HMO | Admitting: Internal Medicine

## 2021-10-16 ENCOUNTER — Other Ambulatory Visit: Payer: Self-pay

## 2021-10-16 VITALS — BP 106/64 | HR 81 | Temp 98.4°F | Wt 151.6 lb

## 2021-10-16 DIAGNOSIS — I132 Hypertensive heart and chronic kidney disease with heart failure and with stage 5 chronic kidney disease, or end stage renal disease: Secondary | ICD-10-CM

## 2021-10-16 DIAGNOSIS — Z992 Dependence on renal dialysis: Secondary | ICD-10-CM | POA: Diagnosis not present

## 2021-10-16 DIAGNOSIS — K921 Melena: Secondary | ICD-10-CM

## 2021-10-16 DIAGNOSIS — F331 Major depressive disorder, recurrent, moderate: Secondary | ICD-10-CM

## 2021-10-16 DIAGNOSIS — I214 Non-ST elevation (NSTEMI) myocardial infarction: Secondary | ICD-10-CM

## 2021-10-16 DIAGNOSIS — G43009 Migraine without aura, not intractable, without status migrainosus: Secondary | ICD-10-CM | POA: Diagnosis not present

## 2021-10-16 DIAGNOSIS — N186 End stage renal disease: Secondary | ICD-10-CM | POA: Diagnosis not present

## 2021-10-16 DIAGNOSIS — R3915 Urgency of urination: Secondary | ICD-10-CM

## 2021-10-16 DIAGNOSIS — R4689 Other symptoms and signs involving appearance and behavior: Secondary | ICD-10-CM

## 2021-10-16 LAB — URINALYSIS, ROUTINE W REFLEX MICROSCOPIC
Bilirubin, UA: NEGATIVE
Ketones, UA: NEGATIVE
Leukocytes,UA: NEGATIVE
Nitrite, UA: NEGATIVE
RBC, UA: NEGATIVE
Specific Gravity, UA: 1.025 (ref 1.005–1.030)
Urobilinogen, Ur: 0.2 mg/dL (ref 0.2–1.0)
pH, UA: 7 (ref 5.0–7.5)

## 2021-10-16 LAB — MICROSCOPIC EXAMINATION
Bacteria, UA: NONE SEEN
Epithelial Cells (non renal): NONE SEEN /hpf (ref 0–10)
WBC, UA: NONE SEEN /hpf (ref 0–5)

## 2021-10-16 MED ORDER — TAMSULOSIN HCL 0.4 MG PO CAPS: 0.8000 mg | ORAL_CAPSULE | Freq: Every day | ORAL | 3 refills | Status: DC

## 2021-10-16 MED ORDER — NORTRIPTYLINE HCL 25 MG PO CAPS
25.0000 mg | ORAL_CAPSULE | Freq: Every day | ORAL | 1 refills | Status: DC
Start: 2021-10-16 — End: 2022-04-03

## 2021-10-16 NOTE — Progress Notes (Signed)
BP 106/64    Pulse 81    Temp 98.4 F (36.9 C)    Wt 151 lb 9.6 oz (68.8 kg)    SpO2 98%    BMI 23.74 kg/m    Subjective:    [name], male    DOB: 12-26-1955, 66 y.o.   MRN: [id number]  HPI: James Arnold is a 66 y.o. male  Chief Complaint  Patient presents with   Migraine   agression   Migraines are much better.   ANXIETY/AGRESSION Duration: chronic Status:better Anxious mood: yes  Excessive worrying: no Irritability: yes  Sweating: no Nausea: no Palpitations:no Hyperventilation: no Panic attacks: no Agoraphobia: no  Obscessions/compulsions: no Depressed mood: no Depression screen [facility] 2/9 10/16/2021 09/06/2021 07/11/2021 01/14/2021 11/16/2020  Decreased Interest 1 1 0 0 0  Down, Depressed, Hopeless 1 1 0 0 0  PHQ - 2 Score 2 2 0 0 0  Altered sleeping 0 2 0 - 0  Tired, decreased energy '2 1 2 ' - 3  Change in appetite 1 1 0 - 0  Feeling bad or failure about yourself  0 0 0 - 0  Trouble concentrating 0 0 0 - 0  Moving slowly or fidgety/restless 0 0 0 - 0  Suicidal thoughts 0 0 0 - 0  PHQ-9 Score '5 6 2 ' - 3  Difficult doing work/chores - - Not difficult at all - -  Some recent data might be hidden   GAD 7 : Generalized Anxiety Score 10/16/2021 09/06/2021 07/11/2021 07/10/2020  Nervous, Anxious, on Edge 0 1 0 0  Control/stop worrying 0 0 0 0  Worry too much - different things 0 0 0 0  Trouble relaxing 0 0 0 0  Restless 0 1 0 0  Easily annoyed or irritable 0 1 0 0  Afraid - awful might happen 0 0 0 0  Total GAD 7 Score 0 3 0 0  Anxiety Difficulty - - Not difficult at all Not difficult at all   Anhedonia: no Weight changes: no Insomnia: no   Hypersomnia: no Fatigue/loss of energy: yes Feelings of worthlessness: no Feelings of guilt: no Impaired concentration/indecisiveness: no Suicidal ideations: no  Crying spells: no Recent Stressors/Life Changes: no   Relationship problems: no   Family stress: no     Financial stress: no    Job stress:  no    Recent death/loss: no  URINARY SYMPTOMS Duration: 1 month Dysuria: no Urinary frequency: no Urgency: yes Small volume voids: yes Symptom severity: moderate Urinary incontinence: yes Foul odor: yes Hematuria: no Abdominal pain: no Back pain: no Suprapubic pain/pressure: no Flank pain: no Fever:  no Vomiting: no Relief with cranberry juice: no Relief with pyridium: no Status: worse Previous urinary tract infection: no Recurrent urinary tract infection: no Sexual activity: monogomous History of sexually transmitted disease: no Penile discharge: no Treatments attempted: increasing fluids    Relevant past medical, surgical, family and social history reviewed and updated as indicated. Interim medical history since our last visit reviewed. Allergies and medications reviewed and updated.  Review of Systems  Constitutional: Negative.   Respiratory: Negative.    Cardiovascular: Negative.   Gastrointestinal: Negative.   Genitourinary:  Positive for urgency. Negative for decreased urine volume, difficulty urinating, dysuria, enuresis, flank pain, frequency, genital sores, hematuria, penile discharge, penile pain, penile swelling, scrotal swelling and testicular pain.  Musculoskeletal: Negative.   Psychiatric/Behavioral: Negative.     Per HPI unless specifically indicated above  Objective:    BP 106/64    Pulse 81    Temp 98.4 F (36.9 C)    Wt 151 lb 9.6 oz (68.8 kg)    SpO2 98%    BMI 23.74 kg/m   Wt Readings from Last 3 Encounters:  10/16/21 151 lb 9.6 oz (68.8 kg)  10/10/21 150 lb (68 kg)  10/02/21 151 lb (68.5 kg)    Physical Exam Vitals and nursing note reviewed.  Constitutional:      General: He is not in acute distress.    Appearance: Normal appearance. He is not ill-appearing, toxic-appearing or diaphoretic.  HENT:     Head: Normocephalic and atraumatic.     Right Ear: External ear normal.     Left Ear: External ear normal.     Nose: Nose normal.      Mouth/Throat:     Mouth: Mucous membranes are moist.     Pharynx: Oropharynx is clear.  Eyes:     General: No scleral icterus.       Right eye: No discharge.        Left eye: No discharge.     Extraocular Movements: Extraocular movements intact.     Conjunctiva/sclera: Conjunctivae normal.     Pupils: Pupils are equal, round, and reactive to light.  Cardiovascular:     Rate and Rhythm: Normal rate and regular rhythm.     Pulses: Normal pulses.     Heart sounds: Normal heart sounds. No murmur heard.   No friction rub. No gallop.  Pulmonary:     Effort: Pulmonary effort is normal. No respiratory distress.     Breath sounds: Normal breath sounds. No stridor. No wheezing, rhonchi or rales.  Chest:     Chest wall: No tenderness.  Musculoskeletal:        General: Normal range of motion.     Cervical back: Normal range of motion and neck supple.  Skin:    General: Skin is warm and dry.     Capillary Refill: Capillary refill takes less than 2 seconds.     Coloration: Skin is not jaundiced or pale.     Findings: No bruising, erythema, lesion or rash.  Neurological:     General: No focal deficit present.     Mental Status: He is alert and oriented to person, place, and time. Mental status is at baseline.  Psychiatric:        Mood and Affect: Mood normal.        Behavior: Behavior normal.        Thought Content: Thought content normal.        Judgment: Judgment normal.    Results for orders placed or performed in visit on 37/10/62  Basic metabolic panel  Result Value Ref Range   Glucose 109 (H) 70 - 99 mg/dL   BUN 55 (H) 8 - 27 mg/dL   Creatinine, Ser 6.91 (H) 0.76 - 1.27 mg/dL   eGFR 8 (L) >59 mL/min/1.73   BUN/Creatinine Ratio 8 (L) 10 - 24   Sodium 144 134 - 144 mmol/L   Potassium 4.8 3.5 - 5.2 mmol/L   Chloride 104 96 - 106 mmol/L   CO2 19 (L) 20 - 29 mmol/L   Calcium 7.4 (L) 8.6 - 10.2 mg/dL  CBC  Result Value Ref Range   WBC 4.5 3.4 - 10.8 x10E3/uL   RBC 2.78 (L)  4.14 - 5.80 x10E6/uL   Hemoglobin 8.1 (L) 13.0 - 17.7 g/dL   Hematocrit 24.7 (  L) 37.5 - 51.0 %   MCV 89 79 - 97 fL   MCH 29.1 26.6 - 33.0 pg   MCHC 32.8 31.5 - 35.7 g/dL   RDW 15.3 11.6 - 15.4 %   Platelets 153 150 - 450 x10E3/uL      Assessment & Plan:   Problem List Items Addressed This Visit       Cardiovascular and Mediastinum   Hypertensive heart and chronic kidney disease with heart failure and with stage 5 chronic kidney disease, or end stage renal disease ([place]) - Primary   Relevant Orders   Comprehensive metabolic panel   NSTEMI (non-ST elevated myocardial infarction) ([place])    Would benefit from cardiac rehab. Concerned about transportation. We will see about getting him hooked up with [facility] for transportation needs. Continue to follow with cardiology. Call with any concerns.       Relevant Orders   AMB Referral to [facility]   Comprehensive metabolic panel     Other   Major depression, recurrent ([place])    Doing better. Continue current regimen. Continue to monitor. Call with any concerns.      Aggression    Seems to be doing better on higher dose of celexa. Continue current regimen. Continue to monitor. Call with any concerns.       Other Visit Diagnoses     Urgency of urination       UA clear. ?BPH- will increase flomax and recheck in 1 month. If not improving or worsening, consider prostatitis.    Relevant Orders   Urinalysis, Routine w reflex microscopic   Comprehensive metabolic panel   PSA   Melena       Likely due to iron. Will check FOBT. Await results.    Relevant Orders   Comprehensive metabolic panel   CBC with Differential/Platelet        Follow up plan: Return in about 4 weeks (around 11/13/2021).

## 2021-10-16 NOTE — Assessment & Plan Note (Signed)
Seems to be doing better on higher dose of celexa. Continue current regimen. Continue to monitor. Call with any concerns.

## 2021-10-16 NOTE — Assessment & Plan Note (Signed)
Better on nortriptyline. Continue current regimen. Continue to monitor. Call with any concerns. Refills given.

## 2021-10-16 NOTE — Assessment & Plan Note (Signed)
Would benefit from cardiac rehab. Concerned about transportation. We will see about getting him hooked up with [facility] for transportation needs. Continue to follow with cardiology. Call with any concerns.

## 2021-10-16 NOTE — Assessment & Plan Note (Signed)
Doing better. Continue current regimen. Continue to monitor. Call with any concerns.

## 2021-10-17 DIAGNOSIS — Z992 Dependence on renal dialysis: Secondary | ICD-10-CM | POA: Diagnosis not present

## 2021-10-17 DIAGNOSIS — N186 End stage renal disease: Secondary | ICD-10-CM | POA: Diagnosis not present

## 2021-10-17 LAB — COMPREHENSIVE METABOLIC PANEL
ALT: 21 IU/L (ref 0–44)
AST: 27 IU/L (ref 0–40)
Albumin/Globulin Ratio: 1.8 (ref 1.2–2.2)
Albumin: 3.4 g/dL — ABNORMAL LOW (ref 3.8–4.8)
Alkaline Phosphatase: 56 IU/L (ref 44–121)
BUN/Creatinine Ratio: 8 — ABNORMAL LOW (ref 10–24)
BUN: 62 mg/dL — ABNORMAL HIGH (ref 8–27)
Bilirubin Total: 0.3 mg/dL (ref 0.0–1.2)
CO2: 22 mmol/L (ref 20–29)
Calcium: 7.7 mg/dL — ABNORMAL LOW (ref 8.6–10.2)
Chloride: 103 mmol/L (ref 96–106)
Creatinine, Ser: 7.38 mg/dL — ABNORMAL HIGH (ref 0.76–1.27)
Globulin, Total: 1.9 g/dL (ref 1.5–4.5)
Glucose: 87 mg/dL (ref 70–99)
Potassium: 4.4 mmol/L (ref 3.5–5.2)
Sodium: 141 mmol/L (ref 134–144)
Total Protein: 5.3 g/dL — ABNORMAL LOW (ref 6.0–8.5)
eGFR: 8 mL/min/{1.73_m2} — ABNORMAL LOW (ref >59)

## 2021-10-17 LAB — CBC WITH DIFFERENTIAL/PLATELET
Basophils Absolute: 0 10*3/uL (ref 0.0–0.2)
Basos: 0 %
EOS (ABSOLUTE): 0.1 10*3/uL (ref 0.0–0.4)
Eos: 1 %
Hematocrit: 24.7 % — ABNORMAL LOW (ref 37.5–51.0)
Hemoglobin: 8 g/dL — ABNORMAL LOW (ref 13.0–17.7)
Immature Grans (Abs): 0 10*3/uL (ref 0.0–0.1)
Immature Granulocytes: 1 %
Lymphocytes Absolute: 0.3 10*3/uL — ABNORMAL LOW (ref 0.7–3.1)
Lymphs: 9 %
MCH: 29.3 pg (ref 26.6–33.0)
MCHC: 32.4 g/dL (ref 31.5–35.7)
MCV: 91 fL (ref 79–97)
Monocytes Absolute: 0.2 10*3/uL (ref 0.1–0.9)
Monocytes: 4 %
Neutrophils Absolute: 3.4 10*3/uL (ref 1.4–7.0)
Neutrophils: 85 %
Platelets: 151 10*3/uL (ref 150–450)
RBC: 2.73 x10E6/uL — CL (ref 4.14–5.80)
RDW: 15.3 % (ref 11.6–15.4)
WBC: 4 10*3/uL (ref 3.4–10.8)

## 2021-10-17 LAB — PSA: Prostate Specific Ag, Serum: 0.3 ng/mL (ref 0.0–4.0)

## 2021-10-18 DIAGNOSIS — N186 End stage renal disease: Secondary | ICD-10-CM | POA: Diagnosis not present

## 2021-10-18 DIAGNOSIS — Z992 Dependence on renal dialysis: Secondary | ICD-10-CM | POA: Diagnosis not present

## 2021-10-20 DIAGNOSIS — N186 End stage renal disease: Secondary | ICD-10-CM | POA: Diagnosis not present

## 2021-10-20 DIAGNOSIS — Z992 Dependence on renal dialysis: Secondary | ICD-10-CM | POA: Diagnosis not present

## 2021-10-21 DIAGNOSIS — N186 End stage renal disease: Secondary | ICD-10-CM | POA: Diagnosis not present

## 2021-10-21 DIAGNOSIS — Z992 Dependence on renal dialysis: Secondary | ICD-10-CM | POA: Diagnosis not present

## 2021-10-22 ENCOUNTER — Encounter: Payer: Self-pay | Admitting: Nurse Practitioner

## 2021-10-22 ENCOUNTER — Ambulatory Visit (INDEPENDENT_AMBULATORY_CARE_PROVIDER_SITE_OTHER): Payer: Medicaid Other | Admitting: Nurse Practitioner

## 2021-10-22 ENCOUNTER — Telehealth: Payer: Self-pay | Admitting: Internal Medicine

## 2021-10-22 ENCOUNTER — Ambulatory Visit: Payer: Medicare HMO | Admitting: Gastroenterology

## 2021-10-22 ENCOUNTER — Other Ambulatory Visit: Payer: Self-pay

## 2021-10-22 ENCOUNTER — Ambulatory Visit: Payer: Self-pay

## 2021-10-22 VITALS — BP 127/76 | HR 90 | Temp 97.9°F | Ht 67.01 in | Wt 147.4 lb

## 2021-10-22 DIAGNOSIS — N186 End stage renal disease: Secondary | ICD-10-CM | POA: Diagnosis not present

## 2021-10-22 DIAGNOSIS — Z992 Dependence on renal dialysis: Secondary | ICD-10-CM | POA: Diagnosis not present

## 2021-10-22 DIAGNOSIS — R58 Hemorrhage, not elsewhere classified: Secondary | ICD-10-CM | POA: Diagnosis not present

## 2021-10-22 LAB — CBC WITH DIFFERENTIAL/PLATELET
Hematocrit: 26.8 % — ABNORMAL LOW (ref 37.5–51.0)
Hemoglobin: 8.7 g/dL — ABNORMAL LOW (ref 13.0–17.7)
Lymphocytes Absolute: 0.5 10*3/uL — ABNORMAL LOW (ref 0.7–3.1)
Lymphs: 12 %
MCH: 29.1 pg (ref 26.6–33.0)
MCHC: 32.5 g/dL (ref 31.5–35.7)
MCV: 90 fL (ref 79–97)
MID (Absolute): 0.2 10*3/uL (ref 0.1–1.6)
MID: 4 %
Neutrophils Absolute: 3.7 10*3/uL (ref 1.4–7.0)
Neutrophils: 84 %
Platelets: 173 10*3/uL (ref 150–450)
RBC: 2.99 x10E6/uL — ABNORMAL LOW (ref 4.14–5.80)
RDW: 16.4 % — ABNORMAL HIGH (ref 11.6–15.4)
WBC: 4.4 10*3/uL (ref 3.4–10.8)

## 2021-10-22 NOTE — Telephone Encounter (Signed)
° °  Chief Complaint: Skin to arms oozing blood Symptoms: Weeping blood Frequency: Started this week Pertinent Negatives: Patient denies Ovious injury Disposition: [] ED /[] Urgent Care (no appt availability in office) / [x] Appointment(In office/virtual)/ []  [facility]/ [] Home Care/ [] Refused Recommended Disposition /[] [place] [facility]/ []  Follow-up with PCP Additional Notes:    Reason for Disposition  [1] Weak immune system (e.g., HIV positive, cancer chemo, splenectomy, organ transplant, chronic steroids) AND [2] minor skin tear  Answer Assessment - Initial Assessment Questions 1. DESCRIPTION: "What does the injury look like?"  "Is there any part of the skin missing?"  If yes, "How much of the skin flap is missing?"  (25%, 50%, 75%, all)     Skin oozing 2. SIZE: "How large is the skin tear?"     Small areas 3. BLEEDING: "Is it bleeding now?" If Yes, ask: "Is it difficult to stop?"     Yes, oozing 4. LOCATION: "Where is the skin tear located?"     Both arms 5. ONSET: "How long ago did the skin tear occur?"     This week 6. MECHANISM: "Tell me how it happened."     On Blood thinner  Protocols used: Skin Tear-A-AH

## 2021-10-22 NOTE — Telephone Encounter (Signed)
Patient's wife states patient is "bleeding through his skin on his arms". States he went to his PCP today and advised him to stop his blood thinner until he goes back on Friday for a follow up. Please call to discuss.

## 2021-10-22 NOTE — Telephone Encounter (Signed)
Called and spoke with pt's wife, James Arnold (DPR approved).  Pt saw his PCP this morning d/t bleeding in left upper arm.  [name] describes as bleeding noted under skin for several days, then d/t thin/fragile skin he bumped arm causing skin tear.  Dressing applied but has continued to saturate gauze dressing in place.   Pt with h/o chronic anemia. STAT CBC checked at PCP and results in chart.  Plavix was stopped at office visit. Pt did take Plavix this morning.  Pt is continuing Aspirin 81 mg daily.  Pt to follow up with PCP this Friday.  PCI at [facility] December 2022.   Drsg changed at ov and James Arnold reports drsg dry and intact at this time.  Pt does still have dark stools, no s/s of hematuria or BRBPR.   [name] wanted to make Dr. Saunders Revel aware of this update.  Pt will f/u with PCP Friday.  F/u scheduled in our office 11/04/21.   Will forward to Dr. Saunders Revel.

## 2021-10-22 NOTE — Progress Notes (Signed)
BP 127/76    Pulse 90    Temp 97.9 F (36.6 C) (Oral)    Ht 5' 7.01" (1.702 m)    Wt 147 lb 6.4 oz (66.9 kg)    SpO2 100%    BMI 23.08 kg/m    Subjective:    [name], male    DOB: [birth year], 66 y.o.   MRN: [id number]  HPI: James Arnold is a 66 y.o. male  Chief Complaint  Patient presents with   Arm Pain    Has been bleeding a lot for the past few days, when you change the dressing it keeps on bleeding.    Patient states he has been having some bleeding on his arm for the last few days.  Patient states he bumped into something since then it hasn't stopped bleeding.  States it was worse last night than it had been.      Relevant past medical, surgical, family and social history reviewed and updated as indicated. Interim medical history since our last visit reviewed. Allergies and medications reviewed and updated.  Review of Systems  Skin:        Left arm bleeding   Per HPI unless specifically indicated above     Objective:    BP 127/76    Pulse 90    Temp 97.9 F (36.6 C) (Oral)    Ht 5' 7.01" (1.702 m)    Wt 147 lb 6.4 oz (66.9 kg)    SpO2 100%    BMI 23.08 kg/m   Wt Readings from Last 3 Encounters:  10/22/21 147 lb 6.4 oz (66.9 kg)  10/16/21 151 lb 9.6 oz (68.8 kg)  10/10/21 150 lb (68 kg)    Physical Exam Vitals and nursing note reviewed.  Constitutional:      General: He is not in acute distress.    Appearance: Normal appearance. He is not ill-appearing, toxic-appearing or diaphoretic.  HENT:     Head: Normocephalic.     Right Ear: External ear normal.     Left Ear: External ear normal.     Nose: Nose normal. No congestion or rhinorrhea.     Mouth/Throat:     Mouth: Mucous membranes are moist.  Eyes:     General:        Right eye: No discharge.        Left eye: No discharge.     Extraocular Movements: Extraocular movements intact.     Conjunctiva/sclera: Conjunctivae normal.     Pupils: Pupils are equal, round, and reactive to light.   Cardiovascular:     Rate and Rhythm: Normal rate and regular rhythm.     Heart sounds: No murmur heard. Pulmonary:     Effort: Pulmonary effort is normal. No respiratory distress.     Breath sounds: Normal breath sounds. No wheezing, rhonchi or rales.  Abdominal:     General: Abdomen is flat. Bowel sounds are normal.  Musculoskeletal:     Cervical back: Normal range of motion and neck supple.  Skin:    General: Skin is warm and dry.     Capillary Refill: Capillary refill takes less than 2 seconds.       Neurological:     General: No focal deficit present.     Mental Status: He is alert and oriented to person, place, and time.  Psychiatric:        Mood and Affect: Mood normal.        Behavior: Behavior  normal.        Thought Content: Thought content normal.        Judgment: Judgment normal.    Results for orders placed or performed in visit on 10/16/21  Microscopic Examination   Urine  Result Value Ref Range   WBC, UA None seen 0 - 5 /hpf   RBC 0-2 0 - 2 /hpf   Epithelial Cells (non renal) None seen 0 - 10 /hpf   Bacteria, UA None seen None seen/Few  Urinalysis, Routine w reflex microscopic  Result Value Ref Range   Specific Gravity, UA 1.025 1.005 - 1.030   pH, UA 7.0 5.0 - 7.5   Color, UA Yellow Yellow   Appearance Ur Clear Clear   Leukocytes,UA Negative Negative   Protein,UA 2+ (A) Negative/Trace   Glucose, UA Trace (A) Negative   Ketones, UA Negative Negative   RBC, UA Negative Negative   Bilirubin, UA Negative Negative   Urobilinogen, Ur 0.2 0.2 - 1.0 mg/dL   Nitrite, UA Negative Negative   Microscopic Examination See below:   Comprehensive metabolic panel  Result Value Ref Range   Glucose 87 70 - 99 mg/dL   BUN 62 (H) 8 - 27 mg/dL   Creatinine, Ser 7.38 (H) 0.76 - 1.27 mg/dL   eGFR 8 (L) >59 mL/min/1.73   BUN/Creatinine Ratio 8 (L) 10 - 24   Sodium 141 134 - 144 mmol/L   Potassium 4.4 3.5 - 5.2 mmol/L   Chloride 103 96 - 106 mmol/L   CO2 22 20 - 29  mmol/L   Calcium 7.7 (L) 8.6 - 10.2 mg/dL   Total Protein 5.3 (L) 6.0 - 8.5 g/dL   Albumin 3.4 (L) 3.8 - 4.8 g/dL   Globulin, Total 1.9 1.5 - 4.5 g/dL   Albumin/Globulin Ratio 1.8 1.2 - 2.2   Bilirubin Total 0.3 0.0 - 1.2 mg/dL   Alkaline Phosphatase 56 44 - 121 IU/L   AST 27 0 - 40 IU/L   ALT 21 0 - 44 IU/L  CBC with Differential/Platelet  Result Value Ref Range   WBC 4.0 3.4 - 10.8 x10E3/uL   RBC 2.73 (LL) 4.14 - 5.80 x10E6/uL   Hemoglobin 8.0 (L) 13.0 - 17.7 g/dL   Hematocrit 24.7 (L) 37.5 - 51.0 %   MCV 91 79 - 97 fL   MCH 29.3 26.6 - 33.0 pg   MCHC 32.4 31.5 - 35.7 g/dL   RDW 15.3 11.6 - 15.4 %   Platelets 151 150 - 450 x10E3/uL   Neutrophils 85 Not Estab. %   Lymphs 9 Not Estab. %   Monocytes 4 Not Estab. %   Eos 1 Not Estab. %   Basos 0 Not Estab. %   Neutrophils Absolute 3.4 1.4 - 7.0 x10E3/uL   Lymphocytes Absolute 0.3 (L) 0.7 - 3.1 x10E3/uL   Monocytes Absolute 0.2 0.1 - 0.9 x10E3/uL   EOS (ABSOLUTE) 0.1 0.0 - 0.4 x10E3/uL   Basophils Absolute 0.0 0.0 - 0.2 x10E3/uL   Immature Granulocytes 1 Not Estab. %   Immature Grans (Abs) 0.0 0.0 - 0.1 x10E3/uL  PSA  Result Value Ref Range   Prostate Specific Ag, Serum 0.3 0.0 - 4.0 ng/mL      Assessment & Plan:   Problem List Items Addressed This Visit   None Visit Diagnoses     Bleeding    -  Primary   Left arm skin tear. Dressing changed.. Stop Plavix. STAT CBC drawn. Wil transfuse if necessary. Last H/H was  8 and 24. FU on Friday. Will call with results.   Relevant Orders   CBC With Differential/Platelet (STAT)        Follow up plan: Return in about 3 days (around 10/25/2021) for FU arm bleeding .

## 2021-10-23 DIAGNOSIS — N186 End stage renal disease: Secondary | ICD-10-CM | POA: Diagnosis not present

## 2021-10-23 DIAGNOSIS — Z992 Dependence on renal dialysis: Secondary | ICD-10-CM | POA: Diagnosis not present

## 2021-10-23 NOTE — Progress Notes (Signed)
Called and gave results over the phone to patient's wife.

## 2021-10-23 NOTE — Telephone Encounter (Signed)
End, Harrell Gave, MD  You 11 hours ago (9:43 PM)   Please let James Arnold know that he should restart clopidogrel immediately.  Given that he is just over a month out from his NSTEMI and complex stenting procedure at [facility], it is contraindicated to hold clopidogrel unless he is having a life-threatening bleeding.  I have reviewed his labs today, which actually show that his hemoglobin has gone up.  In the future, I suggest that he not stop any blood thinners/antiplatelet agents (even if directed to do so by other noncardiology providers) without first conferring with [place], as inappropriate discontinuation of blood thinners could have catastrophic consequences.    Called and spoke to pt's wife James Arnold. Notified of Dr. Darnelle Bos recc above.  Pt will restart Plavix (he actually did take it yesterday as well and has not officially stopped as of today).  Pt will continue ASA and Plavix and will contact our office in the future prior to holding blood thinner/antiplatelet agent.  James Arnold voiced understanding of plan above and has no further questions.

## 2021-10-24 DIAGNOSIS — Z992 Dependence on renal dialysis: Secondary | ICD-10-CM | POA: Diagnosis not present

## 2021-10-24 DIAGNOSIS — N186 End stage renal disease: Secondary | ICD-10-CM | POA: Diagnosis not present

## 2021-10-25 ENCOUNTER — Encounter: Payer: Self-pay | Admitting: Nurse Practitioner

## 2021-10-25 ENCOUNTER — Other Ambulatory Visit: Payer: Self-pay

## 2021-10-25 ENCOUNTER — Ambulatory Visit (INDEPENDENT_AMBULATORY_CARE_PROVIDER_SITE_OTHER): Payer: Medicare HMO | Admitting: Nurse Practitioner

## 2021-10-25 VITALS — BP 124/69 | HR 86 | Temp 97.9°F | Wt 159.6 lb

## 2021-10-25 DIAGNOSIS — L0390 Cellulitis, unspecified: Secondary | ICD-10-CM

## 2021-10-25 DIAGNOSIS — Z992 Dependence on renal dialysis: Secondary | ICD-10-CM | POA: Diagnosis not present

## 2021-10-25 DIAGNOSIS — N186 End stage renal disease: Secondary | ICD-10-CM | POA: Diagnosis not present

## 2021-10-25 MED ORDER — SULFAMETHOXAZOLE-TRIMETHOPRIM 800-160 MG PO TABS: 1.0000 | ORAL_TABLET | Freq: Two times a day (BID) | ORAL | 0 refills | Status: DC

## 2021-10-25 NOTE — Progress Notes (Signed)
BP 124/69    Pulse 86    Temp 97.9 F (36.6 C) (Oral)    Wt 159 lb 9.6 oz (72.4 kg)    SpO2 99%    BMI 24.99 kg/m    Subjective:    [name], male    DOB: [birth year], 66 y.o.   MRN: [id number]  HPI: James Arnold is a 66 y.o. male  Chief Complaint  Patient presents with   Wound Check    L arm     Patient presents to clinic to follow up on wound on his left arm.  States it has recently stopped bleeding.  He has been taking the Plavix at his cardiologists recommendation.  Patient also has redness, warmth and painful left forearm.  Denies fevers.      Relevant past medical, surgical, family and social history reviewed and updated as indicated. Interim medical history since our last visit reviewed. Allergies and medications reviewed and updated.  Review of Systems  Skin:        Left arm bleeding. Left forearm redness and pain   Per HPI unless specifically indicated above     Objective:    BP 124/69    Pulse 86    Temp 97.9 F (36.6 C) (Oral)    Wt 159 lb 9.6 oz (72.4 kg)    SpO2 99%    BMI 24.99 kg/m   Wt Readings from Last 3 Encounters:  10/25/21 159 lb 9.6 oz (72.4 kg)  10/22/21 147 lb 6.4 oz (66.9 kg)  10/16/21 151 lb 9.6 oz (68.8 kg)    Physical Exam Vitals and nursing note reviewed.  Constitutional:      General: He is not in acute distress.    Appearance: Normal appearance. He is not ill-appearing, toxic-appearing or diaphoretic.  HENT:     Head: Normocephalic.     Right Ear: External ear normal.     Left Ear: External ear normal.     Nose: Nose normal. No congestion or rhinorrhea.     Mouth/Throat:     Mouth: Mucous membranes are moist.  Eyes:     General:        Right eye: No discharge.        Left eye: No discharge.     Extraocular Movements: Extraocular movements intact.     Conjunctiva/sclera: Conjunctivae normal.     Pupils: Pupils are equal, round, and reactive to light.  Cardiovascular:     Rate and Rhythm: Normal rate and  regular rhythm.     Heart sounds: No murmur heard. Pulmonary:     Effort: Pulmonary effort is normal. No respiratory distress.     Breath sounds: Normal breath sounds. No wheezing, rhonchi or rales.  Abdominal:     General: Abdomen is flat. Bowel sounds are normal.  Musculoskeletal:     Cervical back: Normal range of motion and neck supple.  Skin:    General: Skin is warm and dry.     Capillary Refill: Capillary refill takes less than 2 seconds.       Neurological:     General: No focal deficit present.     Mental Status: He is alert and oriented to person, place, and time.  Psychiatric:        Mood and Affect: Mood normal.        Behavior: Behavior normal.        Thought Content: Thought content normal.        Judgment:  Judgment normal.    Results for orders placed or performed in visit on 10/22/21  CBC With Differential/Platelet (STAT)  Result Value Ref Range   WBC 4.4 3.4 - 10.8 x10E3/uL   RBC 2.99 (L) 4.14 - 5.80 x10E6/uL   Hemoglobin 8.7 (L) 13.0 - 17.7 g/dL   Hematocrit 26.8 (L) 37.5 - 51.0 %   MCV 90 79 - 97 fL   MCH 29.1 26.6 - 33.0 pg   MCHC 32.5 31.5 - 35.7 g/dL   RDW 16.4 (H) 11.6 - 15.4 %   Platelets 173 150 - 450 x10E3/uL   Neutrophils 84 Not Estab. %   Lymphs 12 Not Estab. %   MID 4 Not Estab. %   Neutrophils Absolute 3.7 1.4 - 7.0 x10E3/uL   Lymphocytes Absolute 0.5 (L) 0.7 - 3.1 x10E3/uL   MID (Absolute) 0.2 0.1 - 1.6 X10E3/uL      Assessment & Plan:   Problem List Items Addressed This Visit   None Visit Diagnoses     Cellulitis, unspecified cellulitis site    -  Primary   Will treat with Bactrim. Side effects and benefits discussed. Follow up in 1 week for reevaluation.        Follow up plan: Return in about 1 week (around 11/01/2021) for cellulitis follow up.

## 2021-10-27 DIAGNOSIS — N186 End stage renal disease: Secondary | ICD-10-CM | POA: Diagnosis not present

## 2021-10-27 DIAGNOSIS — Z992 Dependence on renal dialysis: Secondary | ICD-10-CM | POA: Diagnosis not present

## 2021-10-28 ENCOUNTER — Other Ambulatory Visit: Payer: Self-pay

## 2021-10-28 ENCOUNTER — Inpatient Hospital Stay: Payer: Medicare HMO

## 2021-10-28 ENCOUNTER — Encounter: Payer: Self-pay | Admitting: Internal Medicine

## 2021-10-28 ENCOUNTER — Inpatient Hospital Stay (HOSPITAL_BASED_OUTPATIENT_CLINIC_OR_DEPARTMENT_OTHER): Payer: Medicare HMO | Admitting: Internal Medicine

## 2021-10-28 ENCOUNTER — Inpatient Hospital Stay: Payer: Medicare HMO | Attending: Internal Medicine

## 2021-10-28 DIAGNOSIS — Z800 Family history of malignant neoplasm of digestive organs: Secondary | ICD-10-CM | POA: Diagnosis not present

## 2021-10-28 DIAGNOSIS — D7581 Myelofibrosis: Secondary | ICD-10-CM

## 2021-10-28 DIAGNOSIS — D471 Chronic myeloproliferative disease: Secondary | ICD-10-CM | POA: Insufficient documentation

## 2021-10-28 DIAGNOSIS — Z801 Family history of malignant neoplasm of trachea, bronchus and lung: Secondary | ICD-10-CM | POA: Diagnosis not present

## 2021-10-28 DIAGNOSIS — J449 Chronic obstructive pulmonary disease, unspecified: Secondary | ICD-10-CM

## 2021-10-28 DIAGNOSIS — Z87891 Personal history of nicotine dependence: Secondary | ICD-10-CM | POA: Insufficient documentation

## 2021-10-28 DIAGNOSIS — D649 Anemia, unspecified: Secondary | ICD-10-CM | POA: Insufficient documentation

## 2021-10-28 DIAGNOSIS — L03114 Cellulitis of left upper limb: Secondary | ICD-10-CM | POA: Insufficient documentation

## 2021-10-28 DIAGNOSIS — J432 Centrilobular emphysema: Secondary | ICD-10-CM | POA: Diagnosis not present

## 2021-10-28 DIAGNOSIS — Z8041 Family history of malignant neoplasm of ovary: Secondary | ICD-10-CM | POA: Diagnosis not present

## 2021-10-28 DIAGNOSIS — R161 Splenomegaly, not elsewhere classified: Secondary | ICD-10-CM | POA: Insufficient documentation

## 2021-10-28 DIAGNOSIS — Z803 Family history of malignant neoplasm of breast: Secondary | ICD-10-CM | POA: Insufficient documentation

## 2021-10-28 DIAGNOSIS — N186 End stage renal disease: Secondary | ICD-10-CM | POA: Insufficient documentation

## 2021-10-28 DIAGNOSIS — I120 Hypertensive chronic kidney disease with stage 5 chronic kidney disease or end stage renal disease: Secondary | ICD-10-CM | POA: Diagnosis not present

## 2021-10-28 DIAGNOSIS — Z992 Dependence on renal dialysis: Secondary | ICD-10-CM | POA: Diagnosis not present

## 2021-10-28 LAB — SAMPLE TO BLOOD BANK

## 2021-10-28 LAB — CBC WITH DIFFERENTIAL/PLATELET
Abs Immature Granulocytes: 0.04 10*3/uL (ref 0.00–0.07)
Basophils Absolute: 0 10*3/uL (ref 0.0–0.1)
Basophils Relative: 0 %
Eosinophils Absolute: 0 10*3/uL (ref 0.0–0.5)
Eosinophils Relative: 0 %
HCT: 25.9 % — ABNORMAL LOW (ref 39.0–52.0)
Hemoglobin: 8.2 g/dL — ABNORMAL LOW (ref 13.0–17.0)
Immature Granulocytes: 1 %
Lymphocytes Relative: 5 %
Lymphs Abs: 0.3 10*3/uL — ABNORMAL LOW (ref 0.7–4.0)
MCH: 29.5 pg (ref 26.0–34.0)
MCHC: 31.7 g/dL (ref 30.0–36.0)
MCV: 93.2 fL (ref 80.0–100.0)
Monocytes Absolute: 0.3 10*3/uL (ref 0.1–1.0)
Monocytes Relative: 6 %
Neutro Abs: 4.9 10*3/uL (ref 1.7–7.7)
Neutrophils Relative %: 88 %
Platelets: 143 10*3/uL — ABNORMAL LOW (ref 150–400)
RBC: 2.78 MIL/uL — ABNORMAL LOW (ref 4.22–5.81)
RDW: 15.9 % — ABNORMAL HIGH (ref 11.5–15.5)
WBC: 5.6 10*3/uL (ref 4.0–10.5)
nRBC: 0 % (ref 0.0–0.2)

## 2021-10-28 LAB — COMPREHENSIVE METABOLIC PANEL
ALT: 12 U/L (ref 0–44)
AST: 12 U/L — ABNORMAL LOW (ref 15–41)
Albumin: 3.4 g/dL — ABNORMAL LOW (ref 3.5–5.0)
Alkaline Phosphatase: 46 U/L (ref 38–126)
Anion gap: 11 (ref 5–15)
BUN: 58 mg/dL — ABNORMAL HIGH (ref 8–23)
CO2: 21 mmol/L — ABNORMAL LOW (ref 22–32)
Calcium: 7.5 mg/dL — ABNORMAL LOW (ref 8.9–10.3)
Chloride: 104 mmol/L (ref 98–111)
Creatinine, Ser: 7.72 mg/dL — ABNORMAL HIGH (ref 0.61–1.24)
GFR, Estimated: 7 mL/min — ABNORMAL LOW (ref >60)
Glucose, Bld: 107 mg/dL — ABNORMAL HIGH (ref 70–99)
Potassium: 4.6 mmol/L (ref 3.5–5.1)
Sodium: 136 mmol/L (ref 135–145)
Total Bilirubin: 0.3 mg/dL (ref 0.3–1.2)
Total Protein: 6.4 g/dL — ABNORMAL LOW (ref 6.5–8.1)

## 2021-10-28 LAB — FERRITIN: Ferritin: 944 ng/mL — ABNORMAL HIGH (ref 24–336)

## 2021-10-28 LAB — IRON AND TIBC
Iron: 26 ug/dL — ABNORMAL LOW (ref 45–182)
Saturation Ratios: 13 % — ABNORMAL LOW (ref 17.9–39.5)
TIBC: 195 ug/dL — ABNORMAL LOW (ref 250–450)
UIBC: 169 ug/dL

## 2021-10-28 LAB — LACTATE DEHYDROGENASE: LDH: 529 U/L — ABNORMAL HIGH (ref 98–192)

## 2021-10-28 NOTE — Progress Notes (Signed)
[facility] OFFICE PROGRESS NOTE  Patient Care Team: Valerie Roys, DO as PCP - General (Family Medicine) End, Harrell Gave, MD as PCP - Cardiology (Cardiology) Cammie Sickle, MD as Consulting Physician (Internal Medicine) Dasher, Rayvon Char, MD as Consulting Physician (Dermatology) End, Harrell Gave, MD as Consulting Physician (Cardiology) Anthonette Legato, MD (Nephrology)   Cancer Staging  No matching staging information was found for the patient.   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   # AUGUST 1st week 2019- Retacrit   # October 2nd 2019- jakafi 10 BID [? Renal involvement]; September 2020-peritoneal dialysis; Jakafi 10 mg in the morning; STOPPED/tapered July8th, 2021 [sec intolerance fatigue/myalgias]  # May 4th 2022- START [name] dosing/PD]; STOPPED after2-3 weeks-because of poor tolerance [sever fatigue/? anemia]; NOV 2022-declined Hydrea.  # DEC 2022- NSTEMI- sever 3 vessel CAD- transferred to [facility].  Patient underwent cardiac catheterization/stenting.  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 [name]- kidney Bx- FSG [ on Prednisone Dr.[name]]; July 2020-peritoneal dialysis;   # hx of Lung nodules- resolved [[name]]/quit smoking.  DIAGNOSIS: PRIMARY MYELOFIBROSIS  RISK:LOW     ;GOALS: Control  CURRENT/MOST RECENT THERAPY : Surveillance    Myelofibrosis ([place])      INTERVAL HISTORY:Ambulating independently.  He is accompanied by his wife.  James Arnold 66 y.o.  male pleasant patient above history of primary myelofibrosis Jak 2+ currently off [place] surveillance chronic kidney disease on peritoneal dialysis is here for follow-up.  In  the interim patient diagnosed with cellulitis of the left upper extremity.  Currently on antibiotics.   As per the wife patient has quit smoking since his heart attack in December 2022.   Hereditary of shortness of breath this morning; has not used his inhalers regular basis.   Patient continues to undergoing peritoneal dialysis.  Chronic fatigue.  Appetite is good.  Denies any early satiety.  No chills.  Review of Systems  Constitutional:  Positive for malaise/fatigue. Negative for chills, diaphoresis and fever.  HENT:  Negative for nosebleeds and sore throat.   Eyes:  Negative for double vision.  Respiratory:  Positive for cough and shortness of breath. Negative for hemoptysis, sputum production and wheezing.   Cardiovascular:  Negative for chest pain, palpitations and orthopnea.  Gastrointestinal:  Negative for abdominal pain, blood in stool, constipation, diarrhea, heartburn, melena, nausea and vomiting.  Genitourinary:  Negative for dysuria, frequency and urgency.  Musculoskeletal:  Positive for back pain and joint pain.  Skin: Negative.  Negative for itching and rash.  Neurological:  Positive for dizziness and tingling. Negative for focal weakness, weakness and headaches.  Endo/Heme/Allergies:  Does not bruise/bleed easily.  Psychiatric/Behavioral:  Negative for depression. The patient is not nervous/anxious and does not have insomnia.      PAST MEDICAL HISTORY :  Past Medical History:  Diagnosis Date   Anemia    Benign hypertensive renal disease    CAD (coronary artery disease)    a. 08/1996 s/p BMS to the mLAD ([facility]); b. 12/2017 MV: EF 46%, fixed apical defect w/ significant GI uptake artifact. No ischemia-> low risk; c. 07/2020 MV: EF 51%, no ischemia/infarct; d. 3-vessel disease by [facility] 08/2021; e. complex PCI LAD and dominant LCx at [facility] (08/2021)   Carotid arterial  disease ([place])    a. 11/2020 Carotid U/S: <50% bilat ICA stenoses.   CKD (chronic kidney disease), stage V ([place])     on peritoneal dialysis   Complication of anesthesia    please call by "[name]" when waking up!!   COPD (chronic obstructive pulmonary disease) (HCC)    centrilobular emphysema. pt is poorly controlled with his copd/smoking.   Depression    Diastolic dysfunction    a. 12/2017 Echo: EF 60-65%, no rwma, Gr1 DD, mild AI/MR. Nl RVSP; b. 08/2020 Echo: EF 60-65%, no rwma, GrI DD, nl RV fxn. RVSP 32.59mHg.   Dyspnea on exertion    with exertion   Fatigue    FSGS (focal segmental glomerulosclerosis)    Hyperlipidemia 10/2002   Hypertension    myelofibrosis    bone marrow failure   [place]elofib[place] ([place]   JAK2 (+) primary   [place]ocardial [place]   stent x 1   Pneumoperitoneum 02/2021   PSVT (paroxysmal supraventricular tachycardia) ([facility]    a. 12/2020 Zio: Sinus rhythm 80 (55-117). Rare PACs/PVCs. 17 episodes of SVT (longest 20.3 secs, fastest 184). No triggered events. No afib.   Smoker    Stroke ([facility]    a. 11/2020 MRI Brain: patchy acute/early subacute cortical infarcts w/in the R frontal, parietal, and occiptal lobes. Small, chronic L basal ganglia lacunar infarct.   Thrombocytopenia ([place]     PAST SURGICAL HISTORY :   Past Surgical History:  Procedure Laterality Date   ANGIOPLASTY     BONE MARROW ASPIRATION  03/2009   CARDIAC CATHETERIZATION  1997   stent x 1   COLONOSCOPY WITH PROPOFOL N/A 08/09/2018   Procedure: COLONOSCOPY WITH PROPOFOL;  Surgeon: AJonathon Bellows MD;  [facility];  Service: Gastroenterology;  Laterality: N/A;   CORONARY ARTERY BYPASS GRAFT     CORONARY STENT PLACEMENT  08/1996   ESOPHAGOGASTRODUODENOSCOPY (EGD) WITH PROPOFOL N/A 07/25/2021   Procedure: ESOPHAGOGASTRODUODENOSCOPY (EGD) WITH PROPOFOL;  Surgeon: AJonathon Bellows MD;  [facility];  Service: Gastroenterology;  Laterality: N/A;   EXTERIORIZATION OF A CONTINUOUS AMBULATORY PERITONEAL DIALYSIS CATHETER     EYE SURGERY Bilateral    cats removed   LEFT HEART CATH AND  CORONARY ANGIOGRAPHY N/A 09/12/2021   Procedure: LEFT HEART CATH AND CORONARY ANGIOGRAPHY;  Surgeon: AWellington Hampshire MD;  [facility];  Service: Cardiovascular;  Laterality: N/A;   REVERSE SHOULDER ARTHROPLASTY Left 11/19/2020   Procedure: Left reverse shoulder arthroplasty;  Surgeon: PLeim Fabry MD;  Location: [facility] ORS;  Service: Orthopedics;  Laterality: Left;    FAMILY HISTORY :   Family History  Problem Relation Age of Onset   Stroke Mother        Low BP stroke   Depression Father    Depression Sister        Breast   Heart disease Other    Breast cancer Other    Lung cancer Other    Ovarian cancer Other    Stomach cancer Other     SOCIAL HISTORY:   Social History   Tobacco Use   Smoking status: Former    Packs/day: 0.50    Years: 47.00    Pack years: 23.50    Types: Cigarettes    Quit date: 09/11/2021    Years since quitting: 0.1   Smokeless tobacco: Never   Tobacco comments:    trying to cut back-smokes 6 to 7 cigs per day  Vaping Use   Vaping Use: Never used  Substance Use Topics   Alcohol use: Not Currently    Comment: "6 beers weekly on Saturdays"-quit in July 2022   Drug use: No    ALLERGIES:  has No Known Allergies.  MEDICATIONS:  Current Outpatient Medications  Medication Sig Dispense Refill   albuterol (VENTOLIN HFA) 108 (90 Base) MCG/ACT inhaler Inhale 2 puffs into the lungs every 6 (six) hours as needed for wheezing or shortness of breath. 8 g 2   aspirin 81 MG chewable tablet Chew 1 tablet (81 mg total) by mouth daily. 90 tablet 1   carvedilol (COREG) 12.5 MG tablet Take 1 tablet (12.5 mg total) by mouth 2 (two) times daily. 180 tablet 3   Cholecalciferol (VITAMIN D-1000 MAX ST) 25 MCG (1000 UT) tablet Take 1,000 Units by mouth daily.      citalopram (CELEXA) 40 MG tablet Take 1.5 tablets (60 mg total) by mouth daily. 135 tablet 1   clopidogrel (PLAVIX) 75 MG tablet Take 75 mg by mouth daily.     ferrous sulfate 325 (65 FE)  MG tablet Take 1 tablet (325 mg total) by mouth daily with breakfast. 30 tablet 1   furosemide (LASIX) 20 MG tablet Take 1 tablet (20 mg total) by mouth daily. 90 tablet 1   gentamicin cream (GARAMYCIN) 0.1 % Apply 1 application topically every other day.     levETIRAcetam (KEPPRA) 500 MG tablet Take 1 tablet (500 mg total) by mouth at bedtime. 30 tablet 0   losartan (COZAAR) 25 MG tablet Take 0.5 tablets (12.5 mg total) by mouth daily. 45 tablet 1   nitroGLYCERIN (NITROSTAT) 0.4 MG SL tablet Place 1 tablet (0.4 mg total) under the tongue every 5 (five) minutes as needed for chest pain.  12   nortriptyline (PAMELOR) 25 MG capsule Take 1 capsule (25 mg total) by mouth at bedtime. 90 capsule 1   ondansetron (ZOFRAN ODT) 4 MG disintegrating tablet Take 1 tablet (4 mg total) by mouth every 8 (eight) hours as needed for nausea or vomiting. 12 tablet 0   pantoprazole (PROTONIX) 20 MG tablet Take 20 mg by mouth daily.     polyethylene glycol (MIRALAX / GLYCOLAX) 17 g packet Take 17 g by mouth daily as needed. 14 each 0   predniSONE (DELTASONE) 10 MG tablet Take 10 mg by mouth daily with breakfast.     sulfamethoxazole-trimethoprim (BACTRIM DS) 800-160 MG tablet Take 1 tablet by mouth 2 (two) times daily. 20 tablet 0   tamsulosin (FLOMAX) 0.4 MG CAPS capsule Take 2 capsules (0.8 mg total) by mouth daily. 60 capsule 3   No current facility-administered medications for this visit.    PHYSICAL EXAMINATION: ECOG PERFORMANCE STATUS: 1 - Symptomatic but completely ambulatory  BP 113/61 (BP Location: Right Arm, Patient Position: Sitting, Cuff Size: Normal)    Pulse 96    Temp 99.9 F (37.7 C) (Tympanic)    Ht 5' 7.01" (1.702 m)    Wt 153 lb (69.4 kg)    SpO2 100%    BMI 23.96 kg/m   Filed Weights   10/28/21 0927  Weight: 153 lb (69.4 kg)    Physical Exam HENT:     Head: Normocephalic and atraumatic.     Mouth/Throat:     Pharynx: No oropharyngeal exudate.  Eyes:     Pupils: Pupils are equal,  round, and reactive to light.  Cardiovascular:     Rate and Rhythm: Normal rate and regular rhythm.  Pulmonary:     Effort: No respiratory distress.  Breath sounds: No wheezing.     Comments: Decreased air entry bilaterally. Abdominal:     General: Bowel sounds are normal. There is no distension.     Palpations: Abdomen is soft. There is no mass.     Tenderness: There is no abdominal tenderness. There is no guarding or rebound.     Comments: Positive for splenomegaly.  Musculoskeletal:        General: No tenderness. Normal range of motion.     Cervical back: Normal range of motion and neck supple.  Skin:    General: Skin is warm.  Neurological:     Mental Status: He is alert and oriented to person, place, and time.     Comments: Left upper extremity weakness noted.    Psychiatric:        Mood and Affect: Affect normal.    LABORATORY DATA:  I have reviewed the data as listed    Component Value Date/Time   NA 136 10/28/2021 0816   NA 141 10/16/2021 0952   K 4.6 10/28/2021 0816   CL 104 10/28/2021 0816   CO2 21 (L) 10/28/2021 0816   GLUCOSE 107 (H) 10/28/2021 0816   BUN 58 (H) 10/28/2021 0816   BUN 62 (H) 10/16/2021 0952   CREATININE 7.72 (H) 10/28/2021 0816   CREATININE 1.12 09/30/2013 0953   CALCIUM 7.5 (L) 10/28/2021 0816   PROT 6.4 (L) 10/28/2021 0816   PROT 5.3 (L) 10/16/2021 0952   ALBUMIN 3.4 (L) 10/28/2021 0816   ALBUMIN 3.4 (L) 10/16/2021 0952   AST 12 (L) 10/28/2021 0816   ALT 12 10/28/2021 0816   ALKPHOS 46 10/28/2021 0816   BILITOT 0.3 10/28/2021 0816   BILITOT 0.3 10/16/2021 0952   GFRNONAA 7 (L) 10/28/2021 0816   GFRNONAA >60 09/30/2013 0953   GFRAA 14 (L) 07/10/2020 0909   GFRAA >60 09/30/2013 0953    No results found for: SPEP, UPEP  Lab Results  Component Value Date   WBC 5.6 10/28/2021   NEUTROABS 4.9 10/28/2021   HGB 8.2 (L) 10/28/2021   HCT 25.9 (L) 10/28/2021   MCV 93.2 10/28/2021   PLT 143 (L) 10/28/2021      Chemistry       Component Value Date/Time   NA 136 10/28/2021 0816   NA 141 10/16/2021 0952   K 4.6 10/28/2021 0816   CL 104 10/28/2021 0816   CO2 21 (L) 10/28/2021 0816   BUN 58 (H) 10/28/2021 0816   BUN 62 (H) 10/16/2021 0952   CREATININE 7.72 (H) 10/28/2021 0816   CREATININE 1.12 09/30/2013 0953      Component Value Date/Time   CALCIUM 7.5 (L) 10/28/2021 0816   ALKPHOS 46 10/28/2021 0816   AST 12 (L) 10/28/2021 0816   ALT 12 10/28/2021 0816   BILITOT 0.3 10/28/2021 0816   BILITOT 0.3 10/16/2021 0952       RADIOGRAPHIC STUDIES: I have personally reviewed the radiological images as listed and agreed with the findings in the report. No results found.   ASSESSMENT & PLAN:  Myelofibrosis ([place]) # Primary Myelofiborisis [Bone marrow 2010] jak-2 POSITIVE;currently on surveillance; Patient discontinued Jakafi-because of poor tolerance;OCT 1st, 2022-US Splenomegaly with increase in splenic size from prior imaging, current splenic volume 1024cc; patient symptomatic.     #  Discussed regarding use of Hydrea decrease size of spleen. Recommend Hydrea 500 mg once a day; pt/wife- decline given the concerns for side effects.   # Left arm cellulitis- on bactrim per PCP.   #  Non-STEMI/ischemic cardiomyopathy-s/p stenting clinically stable.  #Anemia hemoglobin- multifactorial is 8.2.  Continue EPO stimulating agent /IV iron as per nephrology. HOLD PRBC transfusion.   #COPD-[quits smoking  since dec2022] - recommend talking to PCP re: nebluizer traetment;poorly controlled;;on prednisone 10 mg a day- see below-    #End-stage renal disease [Dr. Latif]  on PD-STABLE;    # DISPOSITION: mychart apt. # add iron studies/ferritin to today labs # HOLD PRBC transfusion # follow up in 1 month-labs- MD-CBC/CMP;LDH;HOLD tube; posssible1 unit PRBC tranfusion- Dr.B  Orders Placed This Encounter  Procedures   Ferritin    Standing Status:   Future    Number of Occurrences:   1    Standing Expiration Date:    10/28/2022    All questions were answered. The patient knows to call the clinic with any problems, questions or concerns.     Cammie Sickle, MD 10/28/2021 4:46 PM

## 2021-10-28 NOTE — Assessment & Plan Note (Addendum)
#   Primary Myelofiborisis [Bone marrow 2010] jak-2 POSITIVE;currently on surveillance; Patient discontinued Jakafi-because of poor tolerance;OCT 1st, 2022-US Splenomegaly with increase in splenic size from prior imaging, current splenic volume 1024cc; patient symptomatic.     #  Discussed regarding use of Hydrea decrease size of spleen. Recommend Hydrea 500 mg once a day; pt/wife- decline given the concerns for side effects.   # Left arm cellulitis- on bactrim per PCP.   #Non-STEMI/ischemic cardiomyopathy-s/p stenting clinically stable.  #Anemia hemoglobin- multifactorial is 8.2.  Continue EPO stimulating agent /IV iron as per nephrology. HOLD PRBC transfusion.   #COPD-[quits smoking  since dec2022] - recommend talking to PCP re: nebluizer traetment;poorly controlled;;on prednisone 10 mg a day- see below-    #End-stage renal disease [Dr. Latif]  on PD-STABLE;    # DISPOSITION: mychart apt. # add iron studies/ferritin to today labs # HOLD PRBC transfusion # follow up in 1 month-labs- MD-CBC/CMP;LDH;HOLD tube; posssible1 unit PRBC tranfusion- Dr.B

## 2021-10-28 NOTE — Progress Notes (Signed)
Pt has cellulitis in left arm, chest on antibiotic.  Had a dizzy spell this a.m. that lasted 45 minutes, and sob.  Pt is c/o with chills.

## 2021-10-29 DIAGNOSIS — N186 End stage renal disease: Secondary | ICD-10-CM | POA: Diagnosis not present

## 2021-10-29 DIAGNOSIS — Z992 Dependence on renal dialysis: Secondary | ICD-10-CM | POA: Diagnosis not present

## 2021-10-30 ENCOUNTER — Telehealth: Payer: Self-pay

## 2021-10-30 DIAGNOSIS — N186 End stage renal disease: Secondary | ICD-10-CM | POA: Diagnosis not present

## 2021-10-30 DIAGNOSIS — Z992 Dependence on renal dialysis: Secondary | ICD-10-CM | POA: Diagnosis not present

## 2021-10-30 NOTE — Telephone Encounter (Signed)
° °  Telephone encounter was:  Successful.  10/30/2021 Name: ALMIN LIVINGSTONE MRN: [id number] DOB: 11/02/1955  [name] is a 66 y.o. year old male who is a primary care patient of Waylan, Busta, DO . The community resource team was consulted for assistance with Transportation Needs   Care guide performed the following interventions:  Outbound call for Transportation: Wife advised transportation is not needed as she will be taking patient to and from his appointments.  I inquired if there was any other services or assistance I could provided to wife and pt and wife declined. She stated at this time they do not need any assistance.  Follow Up Plan:  No further follow up planned at this time. The patient has been provided with needed resources.  [facility]  [place], [place]  Main Phone: [phone]   E-mail: [email or web address]  Website: [email or web address]

## 2021-10-31 DIAGNOSIS — Z992 Dependence on renal dialysis: Secondary | ICD-10-CM | POA: Diagnosis not present

## 2021-10-31 DIAGNOSIS — N186 End stage renal disease: Secondary | ICD-10-CM | POA: Diagnosis not present

## 2021-11-01 ENCOUNTER — Ambulatory Visit (INDEPENDENT_AMBULATORY_CARE_PROVIDER_SITE_OTHER): Payer: Medicaid Other | Admitting: Nurse Practitioner

## 2021-11-01 ENCOUNTER — Encounter: Payer: Self-pay | Admitting: Nurse Practitioner

## 2021-11-01 ENCOUNTER — Other Ambulatory Visit: Payer: Self-pay

## 2021-11-01 VITALS — BP 132/72 | HR 86 | Temp 98.2°F | Wt 150.4 lb

## 2021-11-01 DIAGNOSIS — Z992 Dependence on renal dialysis: Secondary | ICD-10-CM | POA: Diagnosis not present

## 2021-11-01 DIAGNOSIS — L0390 Cellulitis, unspecified: Secondary | ICD-10-CM | POA: Diagnosis not present

## 2021-11-01 DIAGNOSIS — N186 End stage renal disease: Secondary | ICD-10-CM | POA: Diagnosis not present

## 2021-11-01 NOTE — Progress Notes (Signed)
BP 132/72    Pulse 86    Temp 98.2 F (36.8 C) (Oral)    Wt 150 lb 6.4 oz (68.2 kg)    SpO2 98%    BMI 23.55 kg/m    Subjective:    [name], male    DOB: 13-Nov-1955, 66 y.o.   MRN: [id number]  HPI: James Arnold is a 67 y.o. male  Chief Complaint  Patient presents with   Arm Pain         Patient presents to clinic to follow up on wound on his left arm.  States it has recently stopped bleeding.  He has been taking the Plavix at his cardiologists recommendation.  Patient also has redness, warmth and painful left forearm.  Denies fevers.      Relevant past medical, surgical, family and social history reviewed and updated as indicated. Interim medical history since our last visit reviewed. Allergies and medications reviewed and updated.  Review of Systems  Skin:        Left arm pain and bleeding improved.    Per HPI unless specifically indicated above     Objective:    BP 132/72    Pulse 86    Temp 98.2 F (36.8 C) (Oral)    Wt 150 lb 6.4 oz (68.2 kg)    SpO2 98%    BMI 23.55 kg/m   Wt Readings from Last 3 Encounters:  11/01/21 150 lb 6.4 oz (68.2 kg)  10/28/21 153 lb (69.4 kg)  10/25/21 159 lb 9.6 oz (72.4 kg)    Physical Exam Vitals and nursing note reviewed.  Constitutional:      General: He is not in acute distress.    Appearance: Normal appearance. He is not ill-appearing, toxic-appearing or diaphoretic.  HENT:     Head: Normocephalic.     Right Ear: External ear normal.     Left Ear: External ear normal.     Nose: Nose normal. No congestion or rhinorrhea.     Mouth/Throat:     Mouth: Mucous membranes are moist.  Eyes:     General:        Right eye: No discharge.        Left eye: No discharge.     Extraocular Movements: Extraocular movements intact.     Conjunctiva/sclera: Conjunctivae normal.     Pupils: Pupils are equal, round, and reactive to light.  Cardiovascular:     Rate and Rhythm: Normal rate and regular rhythm.     Heart  sounds: No murmur heard. Pulmonary:     Effort: Pulmonary effort is normal. No respiratory distress.     Breath sounds: Normal breath sounds. No wheezing, rhonchi or rales.  Abdominal:     General: Abdomen is flat. Bowel sounds are normal.  Musculoskeletal:     Cervical back: Normal range of motion and neck supple.  Skin:    General: Skin is warm and dry.     Capillary Refill: Capillary refill takes less than 2 seconds.  Neurological:     General: No focal deficit present.     Mental Status: He is alert and oriented to person, place, and time.  Psychiatric:        Mood and Affect: Mood normal.        Behavior: Behavior normal.        Thought Content: Thought content normal.        Judgment: Judgment normal.    Results for orders placed  or performed in visit on 10/28/21  Ferritin  Result Value Ref Range   Ferritin 944 (H) 24 - 336 ng/mL  Iron and TIBC  Result Value Ref Range   Iron 26 (L) 45 - 182 ug/dL   TIBC 195 (L) 250 - 450 ug/dL   Saturation Ratios 13 (L) 17.9 - 39.5 %   UIBC 169 ug/dL      Assessment & Plan:   Problem List Items Addressed This Visit   None Visit Diagnoses     Cellulitis, unspecified cellulitis site    -  Primary   No redness or warmth on exam. Completed antibiotics.  Denies any symptoms of fever or pain in office today. FU if symptoms worsen or fail to improve.        Follow up plan: Return if symptoms worsen or fail to improve.

## 2021-11-03 DIAGNOSIS — Z992 Dependence on renal dialysis: Secondary | ICD-10-CM | POA: Diagnosis not present

## 2021-11-03 DIAGNOSIS — N186 End stage renal disease: Secondary | ICD-10-CM | POA: Diagnosis not present

## 2021-11-04 ENCOUNTER — Other Ambulatory Visit: Payer: Self-pay

## 2021-11-04 ENCOUNTER — Ambulatory Visit (INDEPENDENT_AMBULATORY_CARE_PROVIDER_SITE_OTHER): Payer: Medicare HMO | Admitting: Nurse Practitioner

## 2021-11-04 ENCOUNTER — Encounter: Payer: Self-pay | Admitting: Nurse Practitioner

## 2021-11-04 VITALS — BP 112/60 | HR 84 | Ht 67.0 in | Wt 151.0 lb

## 2021-11-04 DIAGNOSIS — D649 Anemia, unspecified: Secondary | ICD-10-CM

## 2021-11-04 DIAGNOSIS — N186 End stage renal disease: Secondary | ICD-10-CM | POA: Diagnosis not present

## 2021-11-04 DIAGNOSIS — I2510 Atherosclerotic heart disease of native coronary artery without angina pectoris: Secondary | ICD-10-CM

## 2021-11-04 DIAGNOSIS — I5022 Chronic systolic (congestive) heart failure: Secondary | ICD-10-CM | POA: Diagnosis not present

## 2021-11-04 DIAGNOSIS — Z720 Tobacco use: Secondary | ICD-10-CM

## 2021-11-04 DIAGNOSIS — Z992 Dependence on renal dialysis: Secondary | ICD-10-CM | POA: Diagnosis not present

## 2021-11-04 DIAGNOSIS — E785 Hyperlipidemia, unspecified: Secondary | ICD-10-CM

## 2021-11-04 MED ORDER — ATORVASTATIN CALCIUM 40 MG PO TABS: 40.0000 mg | ORAL_TABLET | Freq: Every day | ORAL | 3 refills | Status: DC

## 2021-11-04 NOTE — Progress Notes (Signed)
Office Visit    Patient Name: James Arnold Date of Encounter: 11/04/2021  Primary Care Provider:  Valerie Roys, DO Primary Cardiologist:  Nelva Bush, MD  Chief Complaint    66 year old male with history of coronary artery disease, HFpEF, hyperlipidemia, tobacco abuse, end-stage renal disease on peritoneal dialysis, stroke, COPD, myelofibrosis and anemia of chronic disease who presents for one month NSTEMI follow-up.  Past Medical History    Past Medical History:  Diagnosis Date   Anemia    Benign hypertensive renal disease    CAD (coronary artery disease)    a. 08/1996 s/p BMS to the mLAD ([facility]); b. 12/2017 MV: EF 46%, --> low risk; c. 07/2020 MV: EF 51%, no ischemia/infarct; d. 08/2021 Cath/PCI: LM nl, LAD 60p, 95p/m ISR (3.0x48 Synergy DES), RI small, nl, LCX 40p, 95d(3.5x16 Synergy DES), OM1 50, RCA 40p/131m.   Carotid arterial disease ([place])    a. 11/2020 Carotid U/S: <50% bilat ICA stenoses.   Chronic HFrEF (heart failure with reduced ejection fraction) ([employer])    a. 08/2022 Echo: EF 35-40%.   CKD (chronic kidney disease), stage V ([place])    on peritoneal dialysis   Complication of anesthesia    please call by "[name]" when waking up!!   COPD (chronic obstructive pulmonary disease) ([facility])    centrilobular emphysema. pt is poorly controlled with his copd/smoking.   Depression    Diastolic dysfunction    a. 12/2017 Echo: EF 60-65%, no rwma, Gr1 DD, mild AI/MR. Nl RVSP; b. 08/2020 Echo: EF 60-65%, no rwma, GrI DD, nl RV fxn. RVSP 32.91mmHg.   Dyspnea on exertion    with exertion   Fatigue    FSGS (focal segmental glomerulosclerosis)    Hyperlipidemia 10/2002   Hypertension    Ischemic cardiomyopathy    a. 08/2022 Echo: EF 35-40%, glob HK, GrII DD, nl RV fxn, mildly dil LA, mild MR, mild AS.   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 12/2020 Zio: Sinus rhythm 80 (55-117). Rare PACs/PVCs. 17 episodes of SVT (longest 20.3 secs, fastest 184). No triggered events. No afib.   Smoker    Stroke [facility])    a. 11/2020 MRI Brain: patchy acute/early subacute cortical infarcts w/in the R frontal, parietal, and occiptal lobes. Small, chronic L basal ganglia lacunar infarct.   Thrombocytopenia [facility])    Past Surgical History:  Procedure Laterality Date   ANGIOPLASTY     BONE MARROW ASPIRATION  03/2009   CARDIAC CATHETERIZATION  1997   stent x 1   COLONOSCOPY WITH PROPOFOL N/A 08/09/2018   Procedure: COLONOSCOPY WITH PROPOFOL;  Surgeon: [name], MD;  Location: [facility] ENDOSCOPY;  Service: Gastroenterology;  Laterality: N/A;   CORONARY ARTERY BYPASS GRAFT     CORONARY STENT PLACEMENT  08/1996   ESOPHAGOGASTRODUODENOSCOPY (EGD) WITH PROPOFOL N/A 07/25/2021   Procedure: ESOPHAGOGASTRODUODENOSCOPY (EGD) WITH PROPOFOL;  Surgeon: [name], MD;  Location: [facility] ENDOSCOPY;  Service: Gastroenterology;  Laterality: N/A;   EXTERIORIZATION OF A CONTINUOUS AMBULATORY PERITONEAL DIALYSIS CATHETER     EYE SURGERY Bilateral    cats removed   LEFT HEART CATH AND CORONARY ANGIOGRAPHY N/A 09/12/2021   Procedure: LEFT HEART CATH AND CORONARY ANGIOGRAPHY;  Surgeon: Wellington Hampshire, MD;  Location: [place] CV LAB;  Service: Cardiovascular;  Laterality:  N/A;   REVERSE SHOULDER ARTHROPLASTY Left 11/19/2020   Procedure: Left reverse shoulder arthroplasty;  Surgeon: Leim Fabry, MD;  Location: [facility] ORS;  Service: Orthopedics;  Laterality: Left;    Allergies  No Known Allergies  History of Present Illness    James Arnold is a 66 year old male with history of coronary artery disease, HFpEF, hyperlipidemia, tobacco abuse, end-stage renal disease on peritoneal dialysis, stroke, COPD, myelofibrosisand anemia of chronic disease.  His coronary artery disease was diagnosed December 1997 when he  underwent PCI of mid (LAD) with BMS following MI.  In 2021 he underwent Myoview which revealed no ischemia or infarct with an EF of 51%. In 2022 following shoulder arthroplasty patient experienced stroke and seizure following the procedure.  MRI of the brain showed acute/early subacute cortical infarcts within the right frontal, parietal, and occipital lobes. Carotid artery duplex were performed in March 2022 which revealed bilateral ICA stenosis that showed less than 50% occlusion. Recommendations were made for loop recorder implant and TEE but patient declined at that time. A ZIO monitor was place that revealed 17 episodes of SVT with no A-fib or triggered events. In December 2023 he was hospitalized at [facility] for non-STEMI that was treated with PCI to the LAD and left circumflex.  He was placed on dual antiplatelet therapy with aspirin and Plavix  In January he followed up with Dr. Saunders Revel after his NSTEMI and was euvolemic during that visit. His echo revealed an (LVEF 35-40%).  During this visit James Arnold reported feeling better however he was still experiencing fatigue on exertion without chest pain and denied syncope and falls.  He was also concerned about a itchy rash on his abdomen that began during his hospital visit which he associated with atorvastatin.  This medication was stopped during the hospitalization.  He was also challenged on 12.5 mg losartan during this visit.  During today's visit James Arnold states that he has experienced increased dizziness and syncopal episodes upon standing.  His blood pressure is also lower compared to his last clinic visit.  He is reporting that his chest pain is better and that the rash on his abdomen has not increased or gotten worse.  We discussed restarting atorvastatin and he agreed with that plan.  We also will stop losartan due to his blood pressure and dizziness when standing that occurred since last visit. Home Medications    Current Outpatient Medications   Medication Sig Dispense Refill   albuterol (VENTOLIN HFA) 108 (90 Base) MCG/ACT inhaler Inhale 2 puffs into the lungs every 6 (six) hours as needed for wheezing or shortness of breath. 8 g 2   aspirin 81 MG chewable tablet Chew 1 tablet (81 mg total) by mouth daily. 90 tablet 1   atorvastatin (LIPITOR) 40 MG tablet Take 1 tablet (40 mg total) by mouth daily. 90 tablet 3   carvedilol (COREG) 12.5 MG tablet Take 1 tablet (12.5 mg total) by mouth 2 (two) times daily. 180 tablet 3   Cholecalciferol (VITAMIN D-1000 MAX ST) 25 MCG (1000 UT) tablet Take 1,000 Units by mouth daily.      citalopram (CELEXA) 40 MG tablet Take 1.5 tablets (60 mg total) by mouth daily. 135 tablet 1   clopidogrel (PLAVIX) 75 MG tablet Take 75 mg by mouth daily.     ferrous sulfate 325 (65 FE) MG tablet Take 1 tablet (325 mg total) by mouth daily with breakfast. 30 tablet 1   furosemide (LASIX) 20 MG tablet Take 1 tablet (20 mg  total) by mouth daily. 90 tablet 1   gentamicin cream (GARAMYCIN) 0.1 % Apply 1 application topically every other day.     levETIRAcetam (KEPPRA) 500 MG tablet Take 1 tablet (500 mg total) by mouth at bedtime. 30 tablet 0   nitroGLYCERIN (NITROSTAT) 0.4 MG SL tablet Place 1 tablet (0.4 mg total) under the tongue every 5 (five) minutes as needed for chest pain.  12   nortriptyline (PAMELOR) 25 MG capsule Take 1 capsule (25 mg total) by mouth at bedtime. 90 capsule 1   ondansetron (ZOFRAN ODT) 4 MG disintegrating tablet Take 1 tablet (4 mg total) by mouth every 8 (eight) hours as needed for nausea or vomiting. 12 tablet 0   pantoprazole (PROTONIX) 20 MG tablet Take 20 mg by mouth daily.     polyethylene glycol (MIRALAX / GLYCOLAX) 17 g packet Take 17 g by mouth daily as needed. 14 each 0   predniSONE (DELTASONE) 10 MG tablet Take 10 mg by mouth daily with breakfast.     sulfamethoxazole-trimethoprim (BACTRIM DS) 800-160 MG tablet Take 1 tablet by mouth 2 (two) times daily. 20 tablet 0   tamsulosin  (FLOMAX) 0.4 MG CAPS capsule Take 2 capsules (0.8 mg total) by mouth daily. 60 capsule 3   No current facility-administered medications for this visit.     Review of Systems   Review of Systems  Constitutional: Negative.   Respiratory:  Negative for shortness of breath.        With minimal Activity  and is chronic in nature  Cardiovascular:  Negative for chest pain, leg swelling and PND.  Skin:  Positive for itching. Negative for rash.       Bruising and lesion on abdomen.  Neurological:  Positive for dizziness.       When standing since starting losartan    All other systems reviewed and are otherwise negative except as noted above.   Physical Exam   Vitals:   11/04/21 1324  BP: 112/60  Pulse: 84  SpO2: 98%      GEN: Well nourished, well developed, in no acute distress. HEENT: normal. Neck: Supple, no JVD, carotid bruits, or masses. Cardiac: RRR, no murmurs, rubs, or gallops. No clubbing, cyanosis, edema.  Radials/PT 2+ and equal bilaterally.  Respiratory:  Respirations regular and unlabored, clear to auscultation bilaterally. GI: Soft, nontender, nondistended, BS + x 4. MS: no deformity or atrophy. Skin: warm and dry, with multiple bruising and lesions on abdomen Neuro:  Strength and sensation are intact. Psych: Normal affect.  Accessory Clinical Findings    ECG personally reviewed by me today -EKG is normal sinus rhythm heart rate of 86 normal axis nonspecific ST and T wave abnormalities left ventricular hypertrophy  no acute changes.   Lab Results  Component Value Date   WBC 5.6 10/28/2021   HGB 8.2 (L) 10/28/2021   HCT 25.9 (L) 10/28/2021   MCV 93.2 10/28/2021   PLT 143 (L) 10/28/2021   Lab Results  Component Value Date   CREATININE 7.72 (H) 10/28/2021   BUN 58 (H) 10/28/2021   NA 136 10/28/2021   K 4.6 10/28/2021   CL 104 10/28/2021   CO2 21 (L) 10/28/2021   Lab Results  Component Value Date   ALT 12 10/28/2021   AST 12 (L) 10/28/2021   ALKPHOS 46  10/28/2021   BILITOT 0.3 10/28/2021   Lab Results  Component Value Date   CHOL 160 07/10/2021   HDL 28 (L) 07/10/2021   LDLCALC 110 (  H) 07/10/2021   TRIG 120 07/10/2021   CHOLHDL 5.7 11/21/2020    Lab Results  Component Value Date   HGBA1C 4.9 11/21/2020    Assessment & Plan    1.  NSTEMI/ CAD: S/P two-vessel PCI at [facility] 08/2021 Dist Cx lesion is 95% stenosed and 95% mid LAD.  He remains on aspirin and Plavix in addition to beta-blocker therapy.  Atorvastatin 40 mg restarted   2. Chronic HFrEF: Euvolemic on exam with ejection fraction of 35 to 40%.  Continue Coreg 12.5 mg, Lasix 20 mg, losartan discontinued.  We discussed the importance of sodium restriction and weighing each day     3.End-stage renal disease: Continue PD with follow-up with nephrology      4.Anemia of chronic disease and myelofibrosis: Patient reports continued dark stools however he denies bright red blood in stools. He does take ferrous sulfate 325 mg.      5 .  Hyperlipidemia: Atorvastatin 40 mg restarted.  Patient's last LDL 110.  Not at goal continue statin therapy.  Labs to be drawn next clinic visit             6. Disposition: Follow-up in 6 to 8 weeks with Dr. Saunders Revel or APP    Mable Fill, Marissa Nestle, NP 11/04/2021, 5:21 PM

## 2021-11-04 NOTE — Patient Instructions (Signed)
Medication Instructions:  Your physician has recommended you make the following change in your medication:   STOP Losartan  RESUME Atorvastatin 40 mg daily. An Rx has been sent to your mail order pharmacy  *If you need a refill on your cardiac medications before your next appointment, please call your pharmacy*   Lab Work: None ordered If you have labs (blood work) drawn today and your tests are completely normal, you will receive your results only by: [place] (if you have MyChart) OR A paper copy in the mail If you have any lab test that is abnormal or we need to change your treatment, we will call you to review the results.   Testing/Procedures: None ordered   Follow-Up: At [facility], you and your health needs are our priority.  As part of our continuing mission to provide you with exceptional heart care, we have created designated Provider Care Teams.  These Care Teams include your primary Cardiologist (physician) and Advanced Practice Providers (APPs -  Physician Assistants and Nurse Practitioners) who all work together to provide you with the care you need, when you need it.  We recommend signing up for the patient portal called "MyChart".  Sign up information is provided on this After Visit Summary.  MyChart is used to connect with patients for Virtual Visits (Telemedicine).  Patients are able to view lab/test results, encounter notes, upcoming appointments, etc.  Non-urgent messages can be sent to your provider as well.   To learn more about what you can do with MyChart, go to [email or web address].    Your next appointment:   6-8 week(s)  The format for your next appointment:   In Person  Provider:   You may see Nelva Bush, MD or one of the following Advanced Practice Providers on your designated Care Team:   Murray Hodgkins, NP   Other Instructions You have been referred to [facility]. They will contact you to schedule your enrollment.

## 2021-11-04 NOTE — Progress Notes (Deleted)
°  nortriptyline (PAMELOR) 25 MG capsule Take 1 capsule (25 mg total) by mouth at bedtime. 10/16/21  Yes Johnson, Megan P, DO  ondansetron (ZOFRAN ODT) 4 MG disintegrating tablet Take 1 tablet (4 mg total) by mouth every 8 (eight) hours as needed for nausea or vomiting. 07/10/21  Yes Johnson, Megan P, DO  pantoprazole (PROTONIX) 20 MG tablet Take 20 mg by mouth daily. 08/10/21  Yes [provider]  polyethylene glycol (MIRALAX / GLYCOLAX) 17 g packet Take 17 g by mouth daily as needed. 11/24/20  Yes Wieting, [name], MD  predniSONE (DELTASONE) 10 MG tablet Take 10 mg by mouth daily with breakfast.   Yes [provider]  sulfamethoxazole-trimethoprim (BACTRIM DS) 800-160 MG tablet Take 1 tablet by mouth 2 (two) times daily. 10/25/21  Yes Jon Billings, NP  tamsulosin (FLOMAX) 0.4 MG CAPS capsule Take 2 capsules (0.8 mg total) by mouth daily. 10/16/21  Yes Park Liter P, DO       Review of Systems    ***.  All other systems reviewed and are otherwise negative except as noted above.    Physical Exam    VS:  There were no vitals taken for this visit. , BMI There is no height or weight on file to calculate BMI.     GEN: Well nourished, well developed, in no acute distress. HEENT: normal. Neck: Supple, no JVD, carotid bruits, or masses. Cardiac: RRR, no murmurs, rubs, or gallops. No clubbing, cyanosis, edema.  Radials/DP/PT 2+ and equal bilaterally.  Respiratory:  Respirations regular and unlabored, clear to auscultation bilaterally. GI: Soft, nontender, nondistended, BS + x 4. MS: no deformity or atrophy. Skin: warm and dry, no rash. Neuro:  Strength and sensation are intact. Psych: Normal affect.  Accessory Clinical Findings    ECG personally reviewed by me today - *** - no acute changes.  Lab Results  Component Value Date   WBC 5.6 10/28/2021   HGB 8.2 (L) 10/28/2021   HCT 25.9 (L) 10/28/2021   MCV 93.2 10/28/2021   PLT 143 (L) 10/28/2021   Lab Results   Component Value Date   CREATININE 7.72 (H) 10/28/2021   BUN 58 (H) 10/28/2021   NA 136 10/28/2021   K 4.6 10/28/2021   CL 104 10/28/2021   CO2 21 (L) 10/28/2021   Lab Results  Component Value Date   ALT 12 10/28/2021   AST 12 (L) 10/28/2021   ALKPHOS 46 10/28/2021   BILITOT 0.3 10/28/2021   Lab Results  Component Value Date   CHOL 160 07/10/2021   HDL 28 (L) 07/10/2021   LDLCALC 110 (H) 07/10/2021   TRIG 120 07/10/2021   CHOLHDL 5.7 11/21/2020    Lab Results  Component Value Date   HGBA1C 4.9 11/21/2020    Assessment & Plan    1.  ***   Murray Hodgkins, NP 11/04/2021, 10:18 AM

## 2021-11-05 DIAGNOSIS — Z992 Dependence on renal dialysis: Secondary | ICD-10-CM | POA: Diagnosis not present

## 2021-11-05 DIAGNOSIS — N186 End stage renal disease: Secondary | ICD-10-CM | POA: Diagnosis not present

## 2021-11-06 ENCOUNTER — Other Ambulatory Visit: Payer: Self-pay | Admitting: *Deleted

## 2021-11-06 DIAGNOSIS — I214 Non-ST elevation (NSTEMI) myocardial infarction: Secondary | ICD-10-CM

## 2021-11-06 DIAGNOSIS — Z992 Dependence on renal dialysis: Secondary | ICD-10-CM | POA: Diagnosis not present

## 2021-11-06 DIAGNOSIS — N186 End stage renal disease: Secondary | ICD-10-CM | POA: Diagnosis not present

## 2021-11-07 DIAGNOSIS — Z992 Dependence on renal dialysis: Secondary | ICD-10-CM | POA: Diagnosis not present

## 2021-11-07 DIAGNOSIS — N186 End stage renal disease: Secondary | ICD-10-CM | POA: Diagnosis not present

## 2021-11-08 DIAGNOSIS — Z992 Dependence on renal dialysis: Secondary | ICD-10-CM | POA: Diagnosis not present

## 2021-11-08 DIAGNOSIS — N186 End stage renal disease: Secondary | ICD-10-CM | POA: Diagnosis not present

## 2021-11-10 DIAGNOSIS — N186 End stage renal disease: Secondary | ICD-10-CM | POA: Diagnosis not present

## 2021-11-10 DIAGNOSIS — Z992 Dependence on renal dialysis: Secondary | ICD-10-CM | POA: Diagnosis not present

## 2021-11-11 DIAGNOSIS — N186 End stage renal disease: Secondary | ICD-10-CM | POA: Diagnosis not present

## 2021-11-11 DIAGNOSIS — Z992 Dependence on renal dialysis: Secondary | ICD-10-CM | POA: Diagnosis not present

## 2021-11-12 DIAGNOSIS — Z992 Dependence on renal dialysis: Secondary | ICD-10-CM | POA: Diagnosis not present

## 2021-11-12 DIAGNOSIS — N186 End stage renal disease: Secondary | ICD-10-CM | POA: Diagnosis not present

## 2021-11-13 DIAGNOSIS — N186 End stage renal disease: Secondary | ICD-10-CM | POA: Diagnosis not present

## 2021-11-13 DIAGNOSIS — Z992 Dependence on renal dialysis: Secondary | ICD-10-CM | POA: Diagnosis not present

## 2021-11-14 DIAGNOSIS — Z992 Dependence on renal dialysis: Secondary | ICD-10-CM | POA: Diagnosis not present

## 2021-11-14 DIAGNOSIS — N186 End stage renal disease: Secondary | ICD-10-CM | POA: Diagnosis not present

## 2021-11-15 DIAGNOSIS — N186 End stage renal disease: Secondary | ICD-10-CM | POA: Diagnosis not present

## 2021-11-15 DIAGNOSIS — Z992 Dependence on renal dialysis: Secondary | ICD-10-CM | POA: Diagnosis not present

## 2021-11-17 DIAGNOSIS — N186 End stage renal disease: Secondary | ICD-10-CM | POA: Diagnosis not present

## 2021-11-17 DIAGNOSIS — Z992 Dependence on renal dialysis: Secondary | ICD-10-CM | POA: Diagnosis not present

## 2021-11-18 ENCOUNTER — Ambulatory Visit (INDEPENDENT_AMBULATORY_CARE_PROVIDER_SITE_OTHER): Payer: Medicaid Other | Admitting: Family Medicine

## 2021-11-18 ENCOUNTER — Emergency Department: Payer: Medicare HMO

## 2021-11-18 ENCOUNTER — Other Ambulatory Visit: Payer: Self-pay

## 2021-11-18 ENCOUNTER — Telehealth: Payer: Self-pay | Admitting: Internal Medicine

## 2021-11-18 ENCOUNTER — Encounter: Payer: Self-pay | Admitting: Intensive Care

## 2021-11-18 ENCOUNTER — Encounter: Payer: Self-pay | Admitting: Family Medicine

## 2021-11-18 ENCOUNTER — Observation Stay
Admission: EM | Admit: 2021-11-18 | Discharge: 2021-11-19 | DRG: 640 | Disposition: A | Discharge status: Home / Self Care | Payer: Medicare HMO | Attending: Internal Medicine | Admitting: Internal Medicine

## 2021-11-18 VITALS — BP 91/53 | HR 84 | Wt 151.0 lb

## 2021-11-18 DIAGNOSIS — D631 Anemia in chronic kidney disease: Secondary | ICD-10-CM | POA: Diagnosis not present

## 2021-11-18 DIAGNOSIS — G4700 Insomnia, unspecified: Secondary | ICD-10-CM | POA: Diagnosis present

## 2021-11-18 DIAGNOSIS — Z20822 Contact with and (suspected) exposure to covid-19: Secondary | ICD-10-CM | POA: Diagnosis present

## 2021-11-18 DIAGNOSIS — E875 Hyperkalemia: Secondary | ICD-10-CM | POA: Diagnosis not present

## 2021-11-18 DIAGNOSIS — I5022 Chronic systolic (congestive) heart failure: Secondary | ICD-10-CM | POA: Diagnosis present

## 2021-11-18 DIAGNOSIS — R42 Dizziness and giddiness: Secondary | ICD-10-CM | POA: Diagnosis not present

## 2021-11-18 DIAGNOSIS — I132 Hypertensive heart and chronic kidney disease with heart failure and with stage 5 chronic kidney disease, or end stage renal disease: Secondary | ICD-10-CM | POA: Diagnosis not present

## 2021-11-18 DIAGNOSIS — F329 Major depressive disorder, single episode, unspecified: Secondary | ICD-10-CM | POA: Diagnosis present

## 2021-11-18 DIAGNOSIS — N400 Enlarged prostate without lower urinary tract symptoms: Secondary | ICD-10-CM | POA: Diagnosis not present

## 2021-11-18 DIAGNOSIS — D649 Anemia, unspecified: Secondary | ICD-10-CM | POA: Diagnosis not present

## 2021-11-18 DIAGNOSIS — I255 Ischemic cardiomyopathy: Secondary | ICD-10-CM | POA: Diagnosis not present

## 2021-11-18 DIAGNOSIS — Z7982 Long term (current) use of aspirin: Secondary | ICD-10-CM

## 2021-11-18 DIAGNOSIS — Z951 Presence of aortocoronary bypass graft: Secondary | ICD-10-CM

## 2021-11-18 DIAGNOSIS — G40909 Epilepsy, unspecified, not intractable, without status epilepticus: Secondary | ICD-10-CM | POA: Diagnosis present

## 2021-11-18 DIAGNOSIS — E785 Hyperlipidemia, unspecified: Secondary | ICD-10-CM | POA: Diagnosis not present

## 2021-11-18 DIAGNOSIS — Z96612 Presence of left artificial shoulder joint: Secondary | ICD-10-CM | POA: Diagnosis present

## 2021-11-18 DIAGNOSIS — N186 End stage renal disease: Secondary | ICD-10-CM | POA: Diagnosis not present

## 2021-11-18 DIAGNOSIS — N2581 Secondary hyperparathyroidism of renal origin: Secondary | ICD-10-CM | POA: Diagnosis present

## 2021-11-18 DIAGNOSIS — Z992 Dependence on renal dialysis: Secondary | ICD-10-CM | POA: Diagnosis not present

## 2021-11-18 DIAGNOSIS — Z87891 Personal history of nicotine dependence: Secondary | ICD-10-CM

## 2021-11-18 DIAGNOSIS — I252 Old myocardial infarction: Secondary | ICD-10-CM | POA: Diagnosis not present

## 2021-11-18 DIAGNOSIS — I5020 Unspecified systolic (congestive) heart failure: Secondary | ICD-10-CM | POA: Insufficient documentation

## 2021-11-18 DIAGNOSIS — N185 Chronic kidney disease, stage 5: Secondary | ICD-10-CM | POA: Diagnosis present

## 2021-11-18 DIAGNOSIS — J432 Centrilobular emphysema: Secondary | ICD-10-CM | POA: Diagnosis not present

## 2021-11-18 DIAGNOSIS — F32A Depression, unspecified: Secondary | ICD-10-CM | POA: Diagnosis present

## 2021-11-18 DIAGNOSIS — M109 Gout, unspecified: Secondary | ICD-10-CM | POA: Diagnosis present

## 2021-11-18 DIAGNOSIS — D7581 Myelofibrosis: Secondary | ICD-10-CM | POA: Diagnosis present

## 2021-11-18 DIAGNOSIS — R3915 Urgency of urination: Secondary | ICD-10-CM | POA: Diagnosis not present

## 2021-11-18 DIAGNOSIS — R9431 Abnormal electrocardiogram [ECG] [EKG]: Secondary | ICD-10-CM | POA: Diagnosis not present

## 2021-11-18 DIAGNOSIS — Z7902 Long term (current) use of antithrombotics/antiplatelets: Secondary | ICD-10-CM

## 2021-11-18 DIAGNOSIS — I2510 Atherosclerotic heart disease of native coronary artery without angina pectoris: Secondary | ICD-10-CM | POA: Diagnosis not present

## 2021-11-18 DIAGNOSIS — D471 Chronic myeloproliferative disease: Secondary | ICD-10-CM | POA: Diagnosis present

## 2021-11-18 DIAGNOSIS — Z79899 Other long term (current) drug therapy: Secondary | ICD-10-CM

## 2021-11-18 DIAGNOSIS — N051 Unspecified nephritic syndrome with focal and segmental glomerular lesions: Secondary | ICD-10-CM | POA: Diagnosis present

## 2021-11-18 DIAGNOSIS — R0602 Shortness of breath: Secondary | ICD-10-CM | POA: Diagnosis not present

## 2021-11-18 DIAGNOSIS — Z87898 Personal history of other specified conditions: Secondary | ICD-10-CM

## 2021-11-18 DIAGNOSIS — Z955 Presence of coronary angioplasty implant and graft: Secondary | ICD-10-CM

## 2021-11-18 DIAGNOSIS — R569 Unspecified convulsions: Secondary | ICD-10-CM

## 2021-11-18 DIAGNOSIS — Z8673 Personal history of transient ischemic attack (TIA), and cerebral infarction without residual deficits: Secondary | ICD-10-CM

## 2021-11-18 DIAGNOSIS — I120 Hypertensive chronic kidney disease with stage 5 chronic kidney disease or end stage renal disease: Secondary | ICD-10-CM | POA: Diagnosis not present

## 2021-11-18 DIAGNOSIS — Z7952 Long term (current) use of systemic steroids: Secondary | ICD-10-CM

## 2021-11-18 DIAGNOSIS — I214 Non-ST elevation (NSTEMI) myocardial infarction: Secondary | ICD-10-CM | POA: Diagnosis present

## 2021-11-18 LAB — CBC
HCT: 25.2 % — ABNORMAL LOW (ref 39.0–52.0)
Hemoglobin: 7.8 g/dL — ABNORMAL LOW (ref 13.0–17.0)
MCH: 29.9 pg (ref 26.0–34.0)
MCHC: 31 g/dL (ref 30.0–36.0)
MCV: 96.6 fL (ref 80.0–100.0)
Platelets: 122 10*3/uL — ABNORMAL LOW (ref 150–400)
RBC: 2.61 MIL/uL — ABNORMAL LOW (ref 4.22–5.81)
RDW: 15.8 % — ABNORMAL HIGH (ref 11.5–15.5)
WBC: 4.1 10*3/uL (ref 4.0–10.5)
nRBC: 0 % (ref 0.0–0.2)

## 2021-11-18 LAB — BASIC METABOLIC PANEL
Anion gap: 16 — ABNORMAL HIGH (ref 5–15)
Anion gap: 15 (ref 5–15)
Anion gap: 13 (ref 5–15)
BUN: 58 mg/dL — ABNORMAL HIGH (ref 8–23)
BUN: 67 mg/dL — ABNORMAL HIGH (ref 8–23)
BUN: 67 mg/dL — ABNORMAL HIGH (ref 8–23)
CO2: 19 mmol/L — ABNORMAL LOW (ref 22–32)
CO2: 17 mmol/L — ABNORMAL LOW (ref 22–32)
CO2: 17 mmol/L — ABNORMAL LOW (ref 22–32)
Calcium: 8.5 mg/dL — ABNORMAL LOW (ref 8.9–10.3)
Calcium: 8.1 mg/dL — ABNORMAL LOW (ref 8.9–10.3)
Calcium: 8.1 mg/dL — ABNORMAL LOW (ref 8.9–10.3)
Chloride: 105 mmol/L (ref 98–111)
Chloride: 103 mmol/L (ref 98–111)
Chloride: 105 mmol/L (ref 98–111)
Creatinine, Ser: 7.08 mg/dL — ABNORMAL HIGH (ref 0.61–1.24)
Creatinine, Ser: 8.96 mg/dL — ABNORMAL HIGH (ref 0.61–1.24)
Creatinine, Ser: 8.93 mg/dL — ABNORMAL HIGH (ref 0.61–1.24)
GFR, Estimated: 8 mL/min — ABNORMAL LOW (ref >60)
GFR, Estimated: 6 mL/min — ABNORMAL LOW (ref >60)
GFR, Estimated: 6 mL/min — ABNORMAL LOW (ref >60)
Glucose, Bld: 116 mg/dL — ABNORMAL HIGH (ref 70–99)
Glucose, Bld: 91 mg/dL (ref 70–99)
Glucose, Bld: 74 mg/dL (ref 70–99)
Potassium: 5.6 mmol/L — ABNORMAL HIGH (ref 3.5–5.1)
Potassium: 5.7 mmol/L — ABNORMAL HIGH (ref 3.5–5.1)
Potassium: 5.7 mmol/L — ABNORMAL HIGH (ref 3.5–5.1)
Sodium: 140 mmol/L (ref 135–145)
Sodium: 135 mmol/L (ref 135–145)
Sodium: 135 mmol/L (ref 135–145)

## 2021-11-18 LAB — URINALYSIS, ROUTINE W REFLEX MICROSCOPIC
Bilirubin, UA: NEGATIVE
Glucose, UA: NEGATIVE
Ketones, UA: NEGATIVE
Leukocytes,UA: NEGATIVE
Nitrite, UA: NEGATIVE
RBC, UA: NEGATIVE
Specific Gravity, UA: 1.015 (ref 1.005–1.030)
Urobilinogen, Ur: 1 mg/dL (ref 0.2–1.0)
pH, UA: 7 (ref 5.0–7.5)

## 2021-11-18 LAB — RESP PANEL BY RT-PCR (FLU A&B, COVID) ARPGX2
Influenza A by PCR: NEGATIVE
Influenza B by PCR: NEGATIVE
SARS Coronavirus 2 by RT PCR: NEGATIVE

## 2021-11-18 LAB — BODY FLUID CELL COUNT WITH DIFFERENTIAL
Eos, Fluid: 0 %
Lymphs, Fluid: 30 %
Monocyte-Macrophage-Serous Fluid: 30 %
Neutrophil Count, Fluid: 40 %
Other Cells, Fluid: 0 %
Total Nucleated Cell Count, Fluid: 21 cu mm

## 2021-11-18 LAB — MICROSCOPIC EXAMINATION
Bacteria, UA: NONE SEEN
Epithelial Cells (non renal): NONE SEEN /hpf (ref 0–10)
RBC, Urine: NONE SEEN /hpf (ref 0–2)
WBC, UA: NONE SEEN /hpf (ref 0–5)

## 2021-11-18 LAB — TROPONIN I (HIGH SENSITIVITY)
Troponin I (High Sensitivity): 45 ng/L — ABNORMAL HIGH (ref <18)
Troponin I (High Sensitivity): 38 ng/L — ABNORMAL HIGH (ref <18)

## 2021-11-18 LAB — PREPARE RBC (CROSSMATCH)

## 2021-11-18 MED ORDER — DEXTROSE 50 % IV SOLN: 1.0000 | Freq: Once | INTRAVENOUS | Status: DC

## 2021-11-18 MED ORDER — SODIUM CHLORIDE 0.9 % IV SOLN
10.0000 mL/h | Freq: Once | INTRAVENOUS | Status: AC
  Administered 2021-11-18: 10 mL/h via INTRAVENOUS

## 2021-11-18 MED ORDER — SODIUM CHLORIDE 0.9% FLUSH
3.0000 mL | INTRAVENOUS | Status: DC | PRN
Start: 2021-11-18 — End: 2021-11-19

## 2021-11-18 MED ORDER — GENTAMICIN SULFATE 0.1 % EX CREA
1.0000 "application " | TOPICAL_CREAM | Freq: Every day | CUTANEOUS | Status: DC
  Administered 2021-11-18 – 2021-11-19 (×2): 1 via TOPICAL
  Filled 2021-11-18 (×2): qty 15

## 2021-11-18 MED ORDER — PATIROMER SORBITEX CALCIUM 8.4 G PO PACK
16.8000 g | PACK | Freq: Every day | ORAL | Status: DC
  Administered 2021-11-18: 16.8 g via ORAL
  Filled 2021-11-18 (×2): qty 2

## 2021-11-18 MED ORDER — SODIUM CHLORIDE 0.9 % IV SOLN: 250.0000 mL | INTRAVENOUS | Status: DC | PRN

## 2021-11-18 MED ORDER — CARVEDILOL 12.5 MG PO TABS
12.5000 mg | ORAL_TABLET | Freq: Two times a day (BID) | ORAL | Status: DC
  Administered 2021-11-18 – 2021-11-19 (×2): 12.5 mg via ORAL
  Filled 2021-11-18 (×2): qty 1

## 2021-11-18 MED ORDER — DELFLEX-LC/1.5% DEXTROSE 344 MOSM/L IP SOLN
INTRAPERITONEAL | Status: DC
  Filled 2021-11-18 (×2): qty 3000

## 2021-11-18 MED ORDER — FUROSEMIDE 20 MG PO TABS
20.0000 mg | ORAL_TABLET | Freq: Every day | ORAL | Status: DC
  Administered 2021-11-19: 20 mg via ORAL
  Filled 2021-11-18: qty 1

## 2021-11-18 MED ORDER — SODIUM CHLORIDE 0.9% FLUSH
3.0000 mL | Freq: Two times a day (BID) | INTRAVENOUS | Status: DC
  Administered 2021-11-18 – 2021-11-19 (×2): 3 mL via INTRAVENOUS

## 2021-11-18 MED ORDER — INSULIN ASPART 100 UNIT/ML IV SOLN
5.0000 [IU] | Freq: Once | INTRAVENOUS | Status: DC
  Filled 2021-11-18: qty 0.05

## 2021-11-18 MED ORDER — CITALOPRAM HYDROBROMIDE 20 MG PO TABS: 60.0000 mg | ORAL_TABLET | Freq: Every day | ORAL | Status: DC

## 2021-11-18 MED ORDER — ATORVASTATIN CALCIUM 20 MG PO TABS
40.0000 mg | ORAL_TABLET | Freq: Every day | ORAL | Status: DC
  Administered 2021-11-19: 40 mg via ORAL
  Filled 2021-11-18: qty 2

## 2021-11-18 MED ORDER — PREDNISONE 10 MG PO TABS
10.0000 mg | ORAL_TABLET | Freq: Every day | ORAL | Status: DC
  Administered 2021-11-19: 10 mg via ORAL
  Filled 2021-11-18: qty 1

## 2021-11-18 MED ORDER — LEVETIRACETAM 500 MG PO TABS
500.0000 mg | ORAL_TABLET | Freq: Every day | ORAL | Status: DC
  Administered 2021-11-18: 500 mg via ORAL
  Filled 2021-11-18: qty 1

## 2021-11-18 MED ORDER — HEPARIN SODIUM (PORCINE) 5000 UNIT/ML IJ SOLN
5000.0000 [IU] | Freq: Two times a day (BID) | INTRAMUSCULAR | Status: DC
  Administered 2021-11-18 – 2021-11-19 (×2): 5000 [IU] via SUBCUTANEOUS
  Filled 2021-11-18 (×2): qty 1

## 2021-11-18 MED ORDER — TAMSULOSIN HCL 0.4 MG PO CAPS
0.8000 mg | ORAL_CAPSULE | Freq: Every day | ORAL | Status: DC
  Administered 2021-11-19: 0.8 mg via ORAL
  Filled 2021-11-18: qty 2

## 2021-11-18 MED ORDER — ASPIRIN 81 MG PO CHEW
81.0000 mg | CHEWABLE_TABLET | Freq: Every day | ORAL | Status: DC
  Administered 2021-11-19: 81 mg via ORAL
  Filled 2021-11-18: qty 1

## 2021-11-18 MED ORDER — PANTOPRAZOLE SODIUM 20 MG PO TBEC
20.0000 mg | DELAYED_RELEASE_TABLET | Freq: Every day | ORAL | Status: DC
  Administered 2021-11-19: 20 mg via ORAL
  Filled 2021-11-18: qty 1

## 2021-11-18 MED ORDER — CLOPIDOGREL BISULFATE 75 MG PO TABS
75.0000 mg | ORAL_TABLET | Freq: Every day | ORAL | Status: DC
  Administered 2021-11-19: 75 mg via ORAL
  Filled 2021-11-18: qty 1

## 2021-11-18 MED ORDER — NORTRIPTYLINE HCL 25 MG PO CAPS
25.0000 mg | ORAL_CAPSULE | Freq: Every day | ORAL | Status: DC
  Administered 2021-11-18: 25 mg via ORAL
  Filled 2021-11-18 (×2): qty 1

## 2021-11-18 NOTE — ED Provider Triage Note (Signed)
Emergency Medicine Provider Triage Evaluation Note  James Arnold , a 66 y.o. male  was evaluated in triage.  Pt complains of shortness of breath, sent by his MD for changes on the EKG.  History of MI 1 month ago with stents being placed.  States he is short of breath with exertion.  No fever or chills..  Review of Systems  Positive: Shortness of breath, weakness, dizziness Negative: Chest pain  Physical Exam  BP 104/66 (BP Location: Right Arm)    Pulse 87    Temp 98.1 F (36.7 C) (Oral)    Resp 18    Ht 5\' 7"  (1.702 m)    Wt 67.1 kg    SpO2 100%    BMI 23.18 kg/m  Gen:   Awake, no distress   Resp:  Normal effort  MSK:   Moves extremities without difficulty  Other:    Medical Decision Making  Medically screening exam initiated at 11:27 AM.  Appropriate orders placed.  [name] was informed that the remainder of the evaluation will be completed by another provider, this initial triage assessment does not replace that evaluation, and the importance of remaining in the ED until their evaluation is complete.  Cardiac work-up started   Versie Starks, PA-C 11/18/21 1127

## 2021-11-18 NOTE — Progress Notes (Signed)
BP (!) 91/53    Pulse 84    Wt 151 lb (68.5 kg)    SpO2 100%    BMI 23.65 kg/m    Subjective:    Patient ID: [name], male    DOB: April 28, 1956, 66 y.o.   MRN: [id number]  HPI: NIMAI BURBACH is a 66 y.o. male  Chief Complaint  Patient presents with   Urgency of Urination    Follow up    [place]- started on Saturday with SOB. He notes that it felt like when he had his heart attack about a month ago.  Duration: 3 days Onset: sudden Description of breathing discomfort:  Severity: severe Episode duration: constant Frequency: constant Related to exertion: yes Cough: no Chest tightness: no Wheezing: no Fevers: no Chest pain: no Palpitations: no  Nausea: no Diaphoresis: no Deconditioning: yes Status: stable Treatments attempted: albuterol  Relevant past medical, surgical, family and social history reviewed and updated as indicated. Interim medical history since our last visit reviewed. Allergies and medications reviewed and updated.  Review of Systems  Constitutional:  Positive for fatigue. Negative for activity change, appetite change, chills, diaphoresis, fever and unexpected weight change.  HENT: Negative.    Respiratory:  Positive for shortness of breath. Negative for apnea, cough, choking, chest tightness, wheezing and stridor.   Cardiovascular: Negative.   Gastrointestinal: Negative.   Musculoskeletal: Negative.   Neurological: Negative.   Psychiatric/Behavioral: Negative.     Per HPI unless specifically indicated above     Objective:    BP (!) 91/53    Pulse 84    Wt 151 lb (68.5 kg)    SpO2 100%    BMI 23.65 kg/m   Wt Readings from Last 3 Encounters:  11/18/21 151 lb (68.5 kg)  11/04/21 151 lb (68.5 kg)  11/01/21 150 lb 6.4 oz (68.2 kg)    Physical Exam Vitals and nursing note reviewed.  Constitutional:      General: He is not in acute distress.    Appearance: Normal appearance. He is not ill-appearing, toxic-appearing or diaphoretic.   HENT:     Head: Normocephalic and atraumatic.     Right Ear: External ear normal.     Left Ear: External ear normal.     Nose: Nose normal.     Mouth/Throat:     Mouth: Mucous membranes are moist.     Pharynx: Oropharynx is clear.  Eyes:     General: No scleral icterus.       Right eye: No discharge.        Left eye: No discharge.     Extraocular Movements: Extraocular movements intact.     Conjunctiva/sclera: Conjunctivae normal.     Pupils: Pupils are equal, round, and reactive to light.  Cardiovascular:     Rate and Rhythm: Normal rate and regular rhythm.     Pulses: Normal pulses.     Heart sounds: Normal heart sounds. No murmur heard.   No friction rub. No gallop.  Pulmonary:     Effort: Pulmonary effort is normal. No respiratory distress.     Breath sounds: Normal breath sounds. No stridor. No wheezing, rhonchi or rales.  Chest:     Chest wall: No tenderness.  Musculoskeletal:        General: Normal range of motion.     Cervical back: Normal range of motion and neck supple.  Skin:    General: Skin is warm and dry.     Capillary Refill:  Capillary refill takes less than 2 seconds.     Coloration: Skin is not jaundiced or pale.     Findings: No bruising, erythema, lesion or rash.  Neurological:     General: No focal deficit present.     Mental Status: He is alert and oriented to person, place, and time. Mental status is at baseline.  Psychiatric:        Mood and Affect: Mood normal.        Behavior: Behavior normal.        Thought Content: Thought content normal.        Judgment: Judgment normal.    Results for orders placed or performed in visit on 10/28/21  Ferritin  Result Value Ref Range   Ferritin 944 (H) 24 - 336 ng/mL  Iron and TIBC  Result Value Ref Range   Iron 26 (L) 45 - 182 ug/dL   TIBC 195 (L) 250 - 450 ug/dL   Saturation Ratios 13 (L) 17.9 - 39.5 %   UIBC 169 ug/dL      Assessment & Plan:   Problem List Items Addressed This Visit    None Visit Diagnoses     SOB (shortness of breath)    -  Primary   More anemic at 8.0. EKG shows slightly wider QRS and ? ST elevation in V1-2. His cardiologist is in procedure. Will send to ER for r/o MI.   Relevant Orders   EKG 12-Lead (Completed)   CBC With Differential/Platelet   Comprehensive metabolic panel   Urgency of urination       UA normal except protein.    Relevant Orders   Urinalysis, Routine w reflex microscopic        Follow up plan: Return after ER visit.

## 2021-11-18 NOTE — Addendum Note (Signed)
Addended by: Valerie Roys on: 11/18/2021 11:39 AM   Modules accepted: Orders

## 2021-11-18 NOTE — ED Notes (Signed)
Blood bank called about delay in blood. Reports need T&S order. T&S ordered now.

## 2021-11-18 NOTE — ED Triage Notes (Signed)
Patient sent by PCP for abnormal EKG and dizziness. Denies pain. Reports he was seeing his PCP today for follow up about discoloration in left arm

## 2021-11-18 NOTE — ED Notes (Signed)
EDP at [facility]. Admitting MD in to see.

## 2021-11-18 NOTE — ED Provider Notes (Signed)
[facility] Provider Note    Event Date/Time   First MD Initiated Contact with Patient 11/18/21 1221     (approximate)  History   Chief Complaint: Dizziness  HPI  James Arnold is a 66 y.o. male with a past medical history of peritoneal dialysis due to CKD stage V, CAD, chronic anemia, COPD, hypertension who presents to the emergency department for dizziness sent by his PCP for evaluation.  According to the patient he has been feeling dizzy for several months which she states mostly with exertion.  Patient states he went to his PCP today for a work-up had an abnormal EKG and was sent to the ER for further evaluation.  Patient denies any chest pain.  States he was seeing his PCP for a follow-up appointment where he was seen for a rash or discoloration of his left arm which is resolving.  Patient had mentioned that he has been dizzy for several months and after obtaining an EKG the PCP sent the patient to the ER for evaluation.  Here patient appears well, no acute distress.  Calm cooperative.  Physical Exam   Triage Vital Signs: ED Triage Vitals [11/18/21 1113]  Enc Vitals Group     BP 104/66     Pulse Rate 87     Resp 18     Temp 98.1 F (36.7 C)     Temp Source Oral     SpO2 100 %     Weight 148 lb (67.1 kg)     Height 5\' 7"  (1.702 m)     Head Circumference      Peak Flow      Pain Score 0     Pain Loc      Pain Edu?      Excl. in [place]?     Most recent vital signs: Vitals:   11/18/21 1300 11/18/21 1315  BP: (!) 125/59 124/61  Pulse: 86 85  Resp: 18 18  Temp:    SpO2: 99% 99%    General: Awake, no distress.  CV:  Good peripheral perfusion.  Regular rate and rhythm  Resp:  Normal effort.  Equal breath sounds bilaterally.  Abd:  No distention.  Soft, nontender.  No rebound or guarding.   ED Results / Procedures / Treatments   EKG  EKG viewed and interpreted by myself shows a normal sinus rhythm 85 bpm with a narrow QRS, normal axis, slight  QTc prolongation otherwise normal intervals with nonspecific ST changes but no ST elevation.  RADIOLOGY  I personally reviewed the chest x-ray images, no acute abnormality my evaluation. Radiologist read the x-ray is negative.   MEDICATIONS ORDERED IN ED: Medications  0.9 %  sodium chloride infusion (has no administration in time range)     IMPRESSION / MDM / ASSESSMENT AND PLAN / ED COURSE  I reviewed the triage vital signs and the nursing notes.  Patient presents to the emergency department for reportedly abnormal EKG and feeling dizzy.  Patient states the dizziness has been ongoing for months or longer.  States it does get worse when he exerts himself and he has noted some shortness of breath at times with exertion as well.  Patient's lab work today does show he is quite anemic 7.8 patient has a history of anemia likely due to chronic kidney disease.  He states he has had some darker stool at times rectal examination shows light brown stool guaiac negative.  Given the patient's hemoglobin of 7.8  although not significantly changed from baseline given his he is now symptomatic with dizziness and shortness of breath at times we will transfuse 1 units of packed red blood cells.  Patient consented for blood products.  Patient's chemistry is resulted showing a potassium of 5.6 creatinine fairly stable at 7.08 with an elevated anion gap of 16.  Patient's troponin is elevated at 61 however as he has chronic kidney disease on peritoneal dialysis this could be related/baseline for the patient.  We will repeat a troponin.  We will discuss the patient with nephrology for further recommendations.  I spoke with Dr. Juleen [name] regarding the patient.  With the hyperkalemia of 5.6 he recommends dosing Veltassa.  As the patient's anion gap and potassium are elevated believes he would benefit from a unit of packed red blood cells, but would like him admitted to the hospital service for further work-up treatment and  transfusion.  He will see the patient in consultation.  Patient may need to be dialyzed.  CRITICAL CARE Performed by: Harvest Dark   Total critical care time: 30 minutes  Critical care time was exclusive of separately billable procedures and treating other patients.  Critical care was necessary to treat or prevent imminent or life-threatening deterioration.  Critical care was time spent personally by me on the following activities: development of treatment plan with patient and/or surrogate as well as nursing, discussions with consultants, evaluation of patient's response to treatment, examination of patient, obtaining history from patient or surrogate, ordering and performing treatments and interventions, ordering and review of laboratory studies, ordering and review of radiographic studies, pulse oximetry and re-evaluation of patient's condition.   FINAL CLINICAL IMPRESSION(S) / ED DIAGNOSES   Dizziness Hyperkalemia Anemia  Note:  This document was prepared using Dragon voice recognition software and may include unintentional dictation errors.   Harvest Dark, MD 11/18/21 1348

## 2021-11-18 NOTE — ED Notes (Signed)
Blood transfusion consent signed. Denies questions.

## 2021-11-18 NOTE — ED Notes (Signed)
Pt not in room.

## 2021-11-18 NOTE — ED Notes (Signed)
EDP into room, at BS.  ?

## 2021-11-18 NOTE — ED Notes (Signed)
Pt alert, NAD, calm, interactive, resps e/u, speaking in clear complete sentences, VSS. Denies sx or complaints at this time. Denies pain, nausea, sob or dizziness when reclined. Endorses dizziness upon standing.

## 2021-11-18 NOTE — Telephone Encounter (Signed)
PCP called lost call in regards to patient, returned call office stated patient was sent to ER

## 2021-11-18 NOTE — Progress Notes (Signed)
[facility]  ROUNDING NOTE   Subjective:   [name] was admitted to [facility] on 11/18/2021 for Hyperkalemia [E87.5]  Last peritoneal dialysis treatment was last night  Patient's wife is at bedside who assists with history taking. Patient has been getting progressively weaker with shortness of breath, and dizziness. Patient went to his PCP this morning where he was found to have hypotension. Patient states these symptoms have been going on for over a month.   Brought tot he ED where patient was found to have hyperkalemia and anemia.   Patient's blood pressure medications have been weaned for the last month due to these symptoms.   Patient was seen by my partner, Dr. Holley Arnold on 11/14/21.  Objective:  Vital signs in last 24 hours:  Temp:  [98.1 F (36.7 C)] 98.1 F (36.7 C) (02/27 1113) Pulse Rate:  [83-88] 87 (02/27 1415) Resp:  [15-18] 15 (02/27 1415) BP: (91-136)/(53-69) 127/62 (02/27 1415) SpO2:  [99 %-100 %] 99 % (02/27 1415) Weight:  [67.1 kg-68.5 kg] 67.1 kg (02/27 1113)  Weight change:  Filed Weights   11/18/21 1113  Weight: 67.1 kg    Intake/Output: No intake/output data recorded.   Intake/Output this shift:  No intake/output data recorded.  Physical Exam: General: NAD, Laying in bed  Head: Normocephalic, atraumatic. Moist oral mucosal membranes  Eyes: Anicteric, PERRL  Neck: Supple, trachea midline  Lungs:  Clear to auscultation  Heart: Regular rate and rhythm  Abdomen:  Soft, nontender,   Extremities:  no peripheral edema.  Neurologic: Nonfocal, moving all four extremities  Skin: No lesions  Access: PD catheter    Basic Metabolic Panel: Recent Labs  Lab 11/18/21 1130  NA 140  K 5.6*  CL 105  CO2 19*  GLUCOSE 116*  BUN 58*  CREATININE 7.08*  CALCIUM 8.5*    Liver Function Tests: No results for input(s): AST, ALT, ALKPHOS, BILITOT, PROT, ALBUMIN in the last 168 hours. No results for input(s): LIPASE, AMYLASE in the last 168  hours. No results for input(s): AMMONIA in the last 168 hours.  CBC: Recent Labs  Lab 11/18/21 1130  WBC 4.1  HGB 7.8*  HCT 25.2*  MCV 96.6  PLT 122*    Cardiac Enzymes: No results for input(s): CKTOTAL, CKMB, CKMBINDEX, TROPONINI in the last 168 hours.  BNP: Invalid input(s): POCBNP  CBG: No results for input(s): GLUCAP in the last 168 hours.  Microbiology: Results for orders placed or performed in visit on 11/18/21  Microscopic Examination     Status: Abnormal   Collection Time: 11/18/21 10:01 AM   Urine  Result Value Ref Range Status   WBC, UA None seen 0 - 5 /hpf Final   RBC None seen 0 - 2 /hpf Final   Epithelial Cells (non renal) None seen 0 - 10 /hpf Final   Mucus, UA Present (A) Not Estab. Final   Bacteria, UA None seen None seen/Few Final    Coagulation Studies: No results for input(s): LABPROT, INR in the last 72 hours.  Urinalysis: Recent Labs    11/18/21 1001  GLUCOSEU Negative  BILIRUBINUR Negative  PROTEINUR 2+*  NITRITE Negative  LEUKOCYTESUR Negative      Imaging: DG Chest 2 View  Result Date: 11/18/2021 CLINICAL DATA:  Abnormal EKG, shortness of breath EXAM: CHEST - 2 VIEW COMPARISON:  09/11/2021 FINDINGS: The heart size and mediastinal contours are within normal limits. Both lungs are clear. Status post left shoulder reverse arthroplasty. IMPRESSION: No acute abnormality  of the lungs. Electronically Signed   By: James Arnold M.D.   On: 11/18/2021 12:03     Medications:     patiromer  16.8 g Oral Daily     Assessment/ Plan:  [name] is a 66 y.o. 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 Dialysis for tonight.  - PD Orders prepared.  - Lokelma ordered - not currently on an ACE-I/ARB  Hypertension: 127/62. Home regimen of furosemide, tamsulosin and carvedilol.  - monitor blood pressures  Anemia of chronic kidney disease: hemoglobin 7.8. WIth history of myelofibrosis.  - PRBC transfusion scheduled - Recommend hematology consult if no improvement.   Secondary Hyperparathyroidism with hyperphosphatemia and hypocalcemia:  - calcium acetate with meals.    LOS: 1 James Arnold 2/27/20233:16 PM

## 2021-11-18 NOTE — Telephone Encounter (Signed)
error 

## 2021-11-18 NOTE — H&P (Signed)
History and Physical    James Arnold KPQ:[id number] DOB: May 25, 1956 DOA: 11/18/2021  PCP: Valerie Roys, DO  Patient coming from: home   Chief Complaint: dizziness  HPI: James Arnold is a 66 y.o. male with medical history significant for CAD, ESRD on peritoneal dialysis, gout, seizure disorder, hfref, cva, myelofibrosis, who presents with the above.  Was at PCP's office today for f/u appointment where complained of dizziness. PCP referred to the ED.   Patient reports that over the weekend he experienced an episode of shortness of breath that has resolved. Currently denies sob or doe, denies chest pain. No cough or fever. He does report about a month of dizziness which he describes as a room spinning sensation that typically happens when he gets up and moves around. No syncope. He reports compliance w/ his peritoneal dialysis and denies abdominal pain. No nausea or vomiting.  ED Course:   K 5.4, hgb 7.8 from baseline 8s. Spoke w/ nephrology, advised veltassa, 1 u  Review of Systems: As per HPI otherwise 10 point review of systems negative.    Past Medical History:  Diagnosis Date   Anemia    Benign hypertensive renal disease    CAD (coronary artery disease)    a. 08/1996 s/p BMS to the mLAD ([facility]); b. 12/2017 MV: EF 46%, --> low risk; c. 07/2020 MV: EF 51%, no ischemia/infarct; d. 08/2021 Cath/PCI: LM nl, LAD 60p, 95p/m ISR (3.0x48 Synergy DES), RI small, nl, LCX 40p, 95d(3.5x16 Synergy DES), OM1 50, RCA 40p/162m.   Carotid arterial disease ([place])    a. 11/2020 Carotid U/S: <50% bilat ICA stenoses.   Chronic HFrEF (heart failure with reduced ejection fraction) ([place])    a. 08/2022 Echo: EF 35-40%.   CKD (chronic kidney disease), stage V ([place])    on peritoneal dialysis   Complication of anesthesia    please call by "[name]" when waking up!!   COPD (chronic obstructive pulmonary disease) (HCC)    centrilobular emphysema. pt is poorly controlled with his copd/smoking.   Depression     Diastolic dysfunction    a. 12/2017 Echo: EF 60-65%, no rwma, Gr1 DD, mild AI/MR. Nl RVSP; b. 08/2020 Echo: EF 60-65%, no rwma, GrI DD, nl RV fxn. RVSP 32.35mmHg.   Dyspnea on exertion    with exertion   Fatigue    FSGS (focal segmental glomerulosclerosis)    Hyperlipidemia 10/2002   Hypertension    Ischemic cardiomyopathy    a. 08/2022 Echo: EF 35-40%, glob HK, GrII DD, nl RV fxn, mildly dil LA, mild MR, mild AS.   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 12/2020 Zio: Sinus rhythm 80 (55-117). Rare PACs/PVCs. 17 episodes of SVT (longest 20.3 secs, fastest 184). No triggered events. No afib.   Smoker    Stroke [facility])    a. 11/2020 MRI Brain: patchy acute/early subacute cortical infarcts w/in the R frontal, parietal, and occiptal lobes. Small, chronic L basal ganglia lacunar infarct.   Thrombocytopenia [facility])     Past Surgical History:  Procedure Laterality Date   ANGIOPLASTY     BONE MARROW ASPIRATION  03/2009   CARDIAC CATHETERIZATION  1997   stent x 1   COLONOSCOPY WITH PROPOFOL N/A 08/09/2018   Procedure: COLONOSCOPY WITH PROPOFOL;  Surgeon: [name], MD;  Location: [facility] ENDOSCOPY;  Service: Gastroenterology;  Laterality: N/A;   CORONARY ARTERY BYPASS GRAFT     CORONARY STENT PLACEMENT  08/1996   ESOPHAGOGASTRODUODENOSCOPY (EGD) WITH PROPOFOL N/A 07/25/2021   Procedure: ESOPHAGOGASTRODUODENOSCOPY (EGD) WITH PROPOFOL;  Surgeon: [name], MD;  Location: [facility] ENDOSCOPY;  Service: Gastroenterology;  Laterality: N/A;   EXTERIORIZATION OF A CONTINUOUS AMBULATORY PERITONEAL DIALYSIS CATHETER     EYE SURGERY Bilateral    cats removed   LEFT HEART CATH AND CORONARY ANGIOGRAPHY N/A 09/12/2021   Procedure: LEFT HEART CATH AND CORONARY ANGIOGRAPHY;  Surgeon: Wellington Hampshire, MD;  Location: [place]  CV LAB;  Service: Cardiovascular;  Laterality: N/A;   REVERSE SHOULDER ARTHROPLASTY Left 11/19/2020   Procedure: Left reverse shoulder arthroplasty;  Surgeon: Leim Fabry, MD;  Location: [facility] ORS;  Service: Orthopedics;  Laterality: Left;     reports that he quit smoking about 2 months ago. His smoking use included cigarettes. He has a 23.50 pack-year smoking history. He has never used smokeless tobacco. He reports that he does not currently use alcohol. He reports that he does not use drugs.  No Known Allergies  Family History  Problem Relation Age of Onset   Stroke Mother        Low BP stroke   Depression Father    Depression Sister        Breast   Heart disease Other    Breast cancer Other    Lung cancer Other    Ovarian cancer Other    Stomach cancer Other     Prior to Admission medications   Medication Sig Start Date End Date Taking? Authorizing Provider  albuterol (VENTOLIN HFA) 108 (90 Base) MCG/ACT inhaler Inhale 2 puffs into the lungs every 6 (six) hours as needed for wheezing or shortness of breath. 02/27/21   Borders, Kirt Boys, NP  aspirin 81 MG chewable tablet Chew 1 tablet (81 mg total) by mouth daily. 01/08/21   Johnson, Megan P, DO  atorvastatin (LIPITOR) 40 MG tablet Take 1 tablet (40 mg total) by mouth daily. 11/04/21 02/02/22  Theora Gianotti, NP  carvedilol (COREG) 12.5 MG tablet Take 1 tablet (12.5 mg total) by mouth 2 (two) times daily. 10/10/21 01/08/22  End, Harrell Gave, MD  Cholecalciferol (VITAMIN D-1000 MAX ST) 25 MCG (1000 UT) tablet Take 1,000 Units by mouth daily.     [provider]  citalopram (CELEXA) 40 MG tablet Take 1.5 tablets (60 mg total) by mouth daily. 09/06/21   Park Liter P, DO  clopidogrel (PLAVIX) 75 MG tablet Take 75 mg by mouth daily. 09/20/21   [provider]  ferrous sulfate 325 (65 FE) MG tablet Take 1 tablet (325 mg total) by mouth daily with breakfast. 12/30/17 08/06/23  Saundra Shelling, MD  furosemide  (LASIX) 20 MG tablet Take 1 tablet (20 mg total) by mouth daily. 01/08/21   Park Liter P, DO  gentamicin cream (GARAMYCIN) 0.1 % Apply 1 application topically every other day. 03/18/19   [provider]  levETIRAcetam (KEPPRA) 500 MG tablet Take 1 tablet (500 mg total) by mouth at bedtime. 11/24/20   Loletha Grayer, MD  nitroGLYCERIN (NITROSTAT) 0.4 MG SL tablet Place 1 tablet (0.4 mg total) under the tongue every 5 (five) minutes as needed for chest pain. 09/12/21   Enzo Bi, MD  nortriptyline (PAMELOR) 25 MG capsule Take 1 capsule (25 mg total) by mouth at bedtime. 10/16/21   Johnson, Megan P, DO  ondansetron (ZOFRAN ODT) 4 MG disintegrating tablet  Take 1 tablet (4 mg total) by mouth every 8 (eight) hours as needed for nausea or vomiting. 07/10/21   Johnson, Megan P, DO  pantoprazole (PROTONIX) 20 MG tablet Take 20 mg by mouth daily. 08/10/21   [provider]  polyethylene glycol (MIRALAX / GLYCOLAX) 17 g packet Take 17 g by mouth daily as needed. 11/24/20   Loletha Grayer, MD  predniSONE (DELTASONE) 10 MG tablet Take 10 mg by mouth daily with breakfast.    [provider]  tamsulosin (FLOMAX) 0.4 MG CAPS capsule Take 2 capsules (0.8 mg total) by mouth daily. 10/16/21   Valerie Roys, DO    Physical Exam: Vitals:   11/18/21 1330 11/18/21 1345 11/18/21 1400 11/18/21 1415  BP: 125/61 121/62 136/69 127/62  Pulse: 86 88 88 87  Resp: 15 16 17 15   Temp:      TempSrc:      SpO2: 99% 99% 99% 99%  Weight:      Height:        Constitutional: No acute distress Head: Atraumatic Eyes: Conjunctiva clear ENM: Moist mucous membranes. Normal dentition.  Neck: Supple Respiratory: Clear to auscultation bilaterally, no wheezing/rales/rhonchi. Normal respiratory effort. No accessory muscle use. . Cardiovascular: Regular rate and rhythm. No murmurs/rubs/gallops. Abdomen: Non-tender, non-distended. No masses. No rebound or guarding. Positive bowel sounds. Musculoskeletal:  No joint deformity upper and lower extremities. Normal ROM, no contractures. Normal muscle tone.  Skin: No rashes, lesions, or ulcers. Bruises scattered Extremities: No peripheral edema. Palpable peripheral pulses. Neurologic: Alert, moving all 4 extremities. Psychiatric: Normal insight and judgement.   Labs on Admission: I have personally reviewed following labs and imaging studies  CBC: Recent Labs  Lab 11/18/21 1130  WBC 4.1  HGB 7.8*  HCT 25.2*  MCV 96.6  PLT 009*   Basic Metabolic Panel: Recent Labs  Lab 11/18/21 1130  NA 140  K 5.6*  CL 105  CO2 19*  GLUCOSE 116*  BUN 58*  CREATININE 7.08*  CALCIUM 8.5*   GFR: Estimated Creatinine Clearance: 9.7 mL/min (A) (by C-G formula based on SCr of 7.08 mg/dL (H)). Liver Function Tests: No results for input(s): AST, ALT, ALKPHOS, BILITOT, PROT, ALBUMIN in the last 168 hours. No results for input(s): LIPASE, AMYLASE in the last 168 hours. No results for input(s): AMMONIA in the last 168 hours. Coagulation Profile: No results for input(s): INR, PROTIME in the last 168 hours. Cardiac Enzymes: No results for input(s): CKTOTAL, CKMB, CKMBINDEX, TROPONINI in the last 168 hours. BNP (last 3 results) No results for input(s): PROBNP in the last 8760 hours. HbA1C: No results for input(s): HGBA1C in the last 72 hours. CBG: No results for input(s): GLUCAP in the last 168 hours. Lipid Profile: No results for input(s): CHOL, HDL, LDLCALC, TRIG, CHOLHDL, LDLDIRECT in the last 72 hours. Thyroid Function Tests: No results for input(s): TSH, T4TOTAL, FREET4, T3FREE, THYROIDAB in the last 72 hours. Anemia Panel: No results for input(s): VITAMINB12, FOLATE, FERRITIN, TIBC, IRON, RETICCTPCT in the last 72 hours. Urine analysis:    Component Value Date/Time   COLORURINE YELLOW (A) 03/01/2021 0719   APPEARANCEUR Clear 11/18/2021 1001   LABSPEC 1.009 03/01/2021 0719   PHURINE 6.0 03/01/2021 0719   GLUCOSEU Negative 11/18/2021 1001    HGBUR NEGATIVE 03/01/2021 0719   BILIRUBINUR Negative 11/18/2021 1001   KETONESUR NEGATIVE 03/01/2021 0719   PROTEINUR 2+ (A) 11/18/2021 1001   PROTEINUR 100 (A) 03/01/2021 0719   NITRITE Negative 11/18/2021 1001   NITRITE NEGATIVE 03/01/2021 0719  LEUKOCYTESUR Negative 11/18/2021 1001   LEUKOCYTESUR NEGATIVE 03/01/2021 0719    Radiological Exams on Admission: DG Chest 2 View  Result Date: 11/18/2021 CLINICAL DATA:  Abnormal EKG, shortness of breath EXAM: CHEST - 2 VIEW COMPARISON:  09/11/2021 FINDINGS: The heart size and mediastinal contours are within normal limits. Both lungs are clear. Status post left shoulder reverse arthroplasty. IMPRESSION: No acute abnormality of the lungs. Electronically Signed   By: Delanna Ahmadi M.D.   On: 11/18/2021 12:03    EKG: Independently reviewed. Nsr, no overt ischemic changes  Assessment/Plan Principal Problem:   Hyperkalemia Active Problems:   CAD (coronary artery disease)   Myelofibrosis (HCC)   CKD (chronic kidney disease) stage 5, GFR less than 15 ml/min (HCC)   Anemia of chronic kidney failure, stage 5 (HCC)   FSGS (focal segmental glomerulosclerosis)   Dialysis patient ([place])   Focal seizure ([place])   NSTEMI (non-ST elevated myocardial infarction) ([place])   # Hyperkalemia Likely 2/2 esrd. K 5.4 - maintain tele - nephrology to see, likely peritoneal dialysis tonight - veltassa ordered - observation status for now - monitor K q6 - insulin treatment if K rises further  # Vertigo Description of room spinning is consistent w/ vertigo. No headache or focal neurologic symptoms to suggest central process. That happens only when gets up and moves around suggests possible BPPV, though orthostasis is a consideration - PT vestibular eval - check orthostats  # Anemia Baseline hgb 8s, appears to be anemia of ckd. Egd last year w/ gastritis. Stool guiac neg in ED. EDP discussed w/ nephrology, decision made to transfuse 1 unit - 1 unit ordered,  will monitor  # ESRD  # FSGS On peritoneal dialysis. Soft abdomen, low concern for peritonitis. Patient reports good compliance w/ peritoneal dialysis - nephrology to see - cont home prednisone  # Seizure disorder - cont home keppra  # CAD Recent PCI x2. Trop mildly elevated and flat, asymptomatic, do not think acs - cont home asa/plavix, statin, coreg  # HFrEF Appears euvolemic - dialysis as above - cont home lasix - cont home coreg  # MDD # Insomnia - cont home celexa, nortriptyline  # BPH - home flomax  # Myeloproliferative disorder - followed by heme/onc, currently not treated, instead surveilled   DVT prophylaxis: heparin Code Status: full  Family Communication: wife updated telephonically  Consults called: nephrology consulted by EDP   Level of care: Telemetry Medical Status is: Observation The patient remains OBS appropriate and will d/c before 2 midnights.          Desma Maxim MD [facility] Pager [phone]  If 7PM-7AM, please contact night-coverage [email or web address] Password [facility]  11/18/2021, 2:52 PM

## 2021-11-18 NOTE — Progress Notes (Signed)
Interpreted by me on 11/18/21. EKG shows slightly wider QRS and ? ST elevation in V1-2

## 2021-11-19 DIAGNOSIS — N2581 Secondary hyperparathyroidism of renal origin: Secondary | ICD-10-CM | POA: Diagnosis not present

## 2021-11-19 DIAGNOSIS — D631 Anemia in chronic kidney disease: Secondary | ICD-10-CM | POA: Diagnosis not present

## 2021-11-19 DIAGNOSIS — E875 Hyperkalemia: Secondary | ICD-10-CM | POA: Diagnosis not present

## 2021-11-19 DIAGNOSIS — Z992 Dependence on renal dialysis: Secondary | ICD-10-CM | POA: Diagnosis not present

## 2021-11-19 DIAGNOSIS — N186 End stage renal disease: Secondary | ICD-10-CM | POA: Diagnosis not present

## 2021-11-19 DIAGNOSIS — I120 Hypertensive chronic kidney disease with stage 5 chronic kidney disease or end stage renal disease: Secondary | ICD-10-CM | POA: Diagnosis not present

## 2021-11-19 LAB — CBC WITH DIFFERENTIAL/PLATELET
Basophils Absolute: 0 10*3/uL (ref 0.0–0.2)
Basos: 0 %
EOS (ABSOLUTE): 0 10*3/uL (ref 0.0–0.4)
Eos: 0 %
Hematocrit: 22.9 % — ABNORMAL LOW (ref 37.5–51.0)
Hemoglobin: 7.8 g/dL — ABNORMAL LOW (ref 13.0–17.7)
Immature Grans (Abs): 0 10*3/uL (ref 0.0–0.1)
Immature Granulocytes: 0 %
Lymphocytes Absolute: 0.3 10*3/uL — ABNORMAL LOW (ref 0.7–3.1)
Lymphs: 8 %
MCH: 30.8 pg (ref 26.6–33.0)
MCHC: 34.1 g/dL (ref 31.5–35.7)
MCV: 91 fL (ref 79–97)
Monocytes Absolute: 0.2 10*3/uL (ref 0.1–0.9)
Monocytes: 4 %
Neutrophils Absolute: 3.3 10*3/uL (ref 1.4–7.0)
Neutrophils: 88 %
Platelets: 132 10*3/uL — ABNORMAL LOW (ref 150–450)
RBC: 2.53 x10E6/uL — CL (ref 4.14–5.80)
RDW: 14.7 % (ref 11.6–15.4)
WBC: 3.8 10*3/uL (ref 3.4–10.8)

## 2021-11-19 LAB — BASIC METABOLIC PANEL
Anion gap: 13 (ref 5–15)
Anion gap: 14 (ref 5–15)
BUN: 67 mg/dL — ABNORMAL HIGH (ref 8–23)
BUN: 69 mg/dL — ABNORMAL HIGH (ref 8–23)
CO2: 19 mmol/L — ABNORMAL LOW (ref 22–32)
CO2: 20 mmol/L — ABNORMAL LOW (ref 22–32)
Calcium: 8 mg/dL — ABNORMAL LOW (ref 8.9–10.3)
Calcium: 8.2 mg/dL — ABNORMAL LOW (ref 8.9–10.3)
Chloride: 104 mmol/L (ref 98–111)
Chloride: 104 mmol/L (ref 98–111)
Creatinine, Ser: 8.15 mg/dL — ABNORMAL HIGH (ref 0.61–1.24)
Creatinine, Ser: 8.34 mg/dL — ABNORMAL HIGH (ref 0.61–1.24)
GFR, Estimated: 7 mL/min — ABNORMAL LOW (ref >60)
GFR, Estimated: 7 mL/min — ABNORMAL LOW (ref >60)
Glucose, Bld: 94 mg/dL (ref 70–99)
Glucose, Bld: 96 mg/dL (ref 70–99)
Potassium: 3.9 mmol/L (ref 3.5–5.1)
Potassium: 4.4 mmol/L (ref 3.5–5.1)
Sodium: 136 mmol/L (ref 135–145)
Sodium: 138 mmol/L (ref 135–145)

## 2021-11-19 LAB — COMPREHENSIVE METABOLIC PANEL
ALT: 9 IU/L (ref 0–44)
AST: 7 IU/L (ref 0–40)
Albumin/Globulin Ratio: 1.7 (ref 1.2–2.2)
Albumin: 3.5 g/dL — ABNORMAL LOW (ref 3.8–4.8)
Alkaline Phosphatase: 50 IU/L (ref 44–121)
BUN/Creatinine Ratio: 7 — ABNORMAL LOW (ref 10–24)
BUN: 58 mg/dL — ABNORMAL HIGH (ref 8–27)
Bilirubin Total: 0.5 mg/dL (ref 0.0–1.2)
CO2: 17 mmol/L — ABNORMAL LOW (ref 20–29)
Calcium: 8.2 mg/dL — ABNORMAL LOW (ref 8.6–10.2)
Chloride: 105 mmol/L (ref 96–106)
Creatinine, Ser: 8.21 mg/dL — ABNORMAL HIGH (ref 0.76–1.27)
Globulin, Total: 2.1 g/dL (ref 1.5–4.5)
Glucose: 90 mg/dL (ref 70–99)
Potassium: 5.4 mmol/L — ABNORMAL HIGH (ref 3.5–5.2)
Sodium: 140 mmol/L (ref 134–144)
Total Protein: 5.6 g/dL — ABNORMAL LOW (ref 6.0–8.5)
eGFR: 7 mL/min/{1.73_m2} — ABNORMAL LOW (ref >59)

## 2021-11-19 LAB — CBC
HCT: 24.3 % — ABNORMAL LOW (ref 39.0–52.0)
Hemoglobin: 7.9 g/dL — ABNORMAL LOW (ref 13.0–17.0)
MCH: 29.5 pg (ref 26.0–34.0)
MCHC: 32.5 g/dL (ref 30.0–36.0)
MCV: 90.7 fL (ref 80.0–100.0)
Platelets: 120 10*3/uL — ABNORMAL LOW (ref 150–400)
RBC: 2.68 MIL/uL — ABNORMAL LOW (ref 4.22–5.81)
RDW: 15.5 % (ref 11.5–15.5)
WBC: 3.5 10*3/uL — ABNORMAL LOW (ref 4.0–10.5)
nRBC: 0 % (ref 0.0–0.2)

## 2021-11-19 LAB — TYPE AND SCREEN
ABO/RH(D): A POS
Antibody Screen: NEGATIVE
Unit division: 0

## 2021-11-19 LAB — BPAM RBC
Blood Product Expiration Date: 202303012359
ISSUE DATE / TIME: 202302271724
Unit Type and Rh: 6200

## 2021-11-19 LAB — PATHOLOGIST SMEAR REVIEW

## 2021-11-19 MED ORDER — MECLIZINE HCL 25 MG PO TABS
12.5000 mg | ORAL_TABLET | Freq: Two times a day (BID) | ORAL | Status: DC | PRN
  Filled 2021-11-19: qty 0.5

## 2021-11-19 MED ORDER — CITALOPRAM HYDROBROMIDE 20 MG PO TABS
20.0000 mg | ORAL_TABLET | Freq: Every day | ORAL | Status: DC
  Administered 2021-11-19: 20 mg via ORAL
  Filled 2021-11-19: qty 1

## 2021-11-19 MED ORDER — MECLIZINE HCL 12.5 MG PO TABS: 12.5000 mg | ORAL_TABLET | Freq: Two times a day (BID) | ORAL | 0 refills | Status: DC | PRN

## 2021-11-19 MED ORDER — ACETAMINOPHEN 325 MG PO TABS
650.0000 mg | ORAL_TABLET | Freq: Four times a day (QID) | ORAL | Status: DC | PRN
  Administered 2021-11-19: 650 mg via ORAL
  Filled 2021-11-19: qty 2

## 2021-11-19 NOTE — Discharge Summary (Signed)
Physician Discharge Summary   Patient: James Arnold MRN: [id number] DOB: 1956-01-17  Admit date:     11/18/2021  Discharge date: 11/19/21  Discharge Physician: Fritzi Mandes   PCP: Valerie Roys, DO   Recommendations at discharge:    Patient advised to resume his peritoneal dialysis as his routine he is recommended to take as needed meclizine. He will need to follow-up with ENT Dr. Tami Ribas as outpatient. Patient will resume his scheduled appointments with Dr. Rogue Bussing patient will also go for outpatient physical therapy cardiac and vestibular  Discharge Diagnoses: Hyperkalemia dizziness/vertigo anemia of chronic disease  Hospital Course: James Arnold is a 66 y.o. male with medical history significant for CAD, ESRD on peritoneal dialysis, gout, seizure disorder, hfref, cva, myelofibrosis, who presents with dizziness  Was at [facility] office today for f/u appointment where complained of dizziness. PCP referred to the ED.  patient was found to have potassium of 5.7 in the ER.  Hyperkalemia -- patient received treatment with oral meds in the emergency room. He also received dextrose and insulin -- seen by Dr. Juleen [name]. Patient got his PD overnight. Potassium is 3.9. today -- No EKG changes  dizziness/vertigo -- patient states the symptoms have been going on for 1 to 1 1/2 months. -- He has history of CVA in the past -- no focal weakness. He feels room spending every now and then and happens with position change. -- Seen by physical therapy and vestibular therapy evaluation done. Appears more central versus peripheral how were patient will still need vestibular therapy as outpatient and ENT follow-up. -- Dr. Tami Ribas will see patient as outpatient. Will try get appointment for him. --- PRN meclizine low-dose will be prescribed. -- discussed with patient and he is in agreement with plan -- orthostatic vitals stable  anemia of chronic disease -- patient follows with Dr. Rogue Bussing for  chronic myeloid fibrosis. He got one unit of blood transfusion yesterday in the ER. Hemoglobin remains stable around 7.9. Patient's baseline is 8.0-- 8.5 -- discussed with Dr. Rogue Bussing no further acute treatment needed he'll follow-up on scheduled appointment  ESRD FS GS -- continue PD -- continue prednisone -- seen by Dr. Juleen [name] okay for discharge  CAD with non-STEMI December 2022 -- cardiac meds will be continued -- no chest pain -- follow-up with [facility] cardiology on your routine appointment  patient was evaluated by physical therapy. Recommends outpatient cardiac rehab/vestibular therapy. Order forms signed for the same. Discharge plan discussed with patient and he voiced understanding.     Consultants: nephrology Procedures performed: none Disposition: Home Diet recommendation:  Discharge Diet Orders (From admission, onward)     Start     Ordered   11/19/21 0000  Diet - low sodium heart healthy        11/19/21 1136           Renal diet  DISCHARGE MEDICATION: Allergies as of 11/19/2021   No Known Allergies      Medication List     TAKE these medications    albuterol 108 (90 Base) MCG/ACT inhaler Commonly known as: VENTOLIN HFA Inhale 2 puffs into the lungs every 6 (six) hours as needed for wheezing or shortness of breath.   aspirin 81 MG chewable tablet Chew 1 tablet (81 mg total) by mouth daily.   atorvastatin 40 MG tablet Commonly known as: LIPITOR Take 1 tablet (40 mg total) by mouth daily.   carvedilol 12.5 MG tablet Commonly known as: COREG Take 1 tablet (12.5  mg total) by mouth 2 (two) times daily.   citalopram 40 MG tablet Commonly known as: CELEXA Take 1.5 tablets (60 mg total) by mouth daily.   clopidogrel 75 MG tablet Commonly known as: PLAVIX Take 75 mg by mouth daily.   ferrous sulfate 325 (65 FE) MG tablet Take 1 tablet (325 mg total) by mouth daily with breakfast.   furosemide 20 MG tablet Commonly known as: LASIX Take 1  tablet (20 mg total) by mouth daily.   gentamicin cream 0.1 % Commonly known as: GARAMYCIN Apply 1 application topically every other day.   levETIRAcetam 500 MG tablet Commonly known as: KEPPRA Take 1 tablet (500 mg total) by mouth at bedtime.   meclizine 12.5 MG tablet Commonly known as: ANTIVERT Take 1 tablet (12.5 mg total) by mouth 2 (two) times daily as needed for dizziness.   nitroGLYCERIN 0.4 MG SL tablet Commonly known as: NITROSTAT Place 1 tablet (0.4 mg total) under the tongue every 5 (five) minutes as needed for chest pain.   nortriptyline 25 MG capsule Commonly known as: Pamelor Take 1 capsule (25 mg total) by mouth at bedtime.   ondansetron 4 MG disintegrating tablet Commonly known as: Zofran ODT Take 1 tablet (4 mg total) by mouth every 8 (eight) hours as needed for nausea or vomiting.   pantoprazole 20 MG tablet Commonly known as: PROTONIX Take 20 mg by mouth daily.   polyethylene glycol 17 g packet Commonly known as: MIRALAX / GLYCOLAX Take 17 g by mouth daily as needed.   predniSONE 10 MG tablet Commonly known as: DELTASONE Take 10 mg by mouth daily with breakfast.   tamsulosin 0.4 MG Caps capsule Commonly known as: FLOMAX Take 2 capsules (0.8 mg total) by mouth daily.   Vitamin D-1000 Max St 25 MCG (1000 UT) tablet Generic drug: Cholecalciferol Take 1,000 Units by mouth daily.               Discharge Care Instructions  (From admission, onward)           Start     Ordered   11/19/21 0000  Discharge wound care:       Comments: Clean skin near exit site with chloraprep swab sticks.  Starting at catheter, use circular pattern around exit site, moving towards outer edges of area covered by dressing.  Apply gentamicin cream to site once daily.  Cover with dry dressing   11/19/21 1136            Follow-up Information     Beverly Gust, MD. Schedule an appointment as soon as possible for a visit in 1 week(s).   Specialty:  Otolaryngology Why: Vertigo evluation Contact information: [place] [address] [phone]         Park Liter P, DO. Schedule an appointment as soon as possible for a visit in 1 week(s).   Specialty: Family Medicine Why: hosp f/u Contact information: [place] [address] [phone]         Nelva Bush, MD .   Specialty: Cardiology Contact information: [place] [address] [phone]                 Discharge Exam: Danley Danker Weights   11/18/21 1113 11/18/21 1849 11/19/21 0500  Weight: 67.1 kg 70.2 kg 69.3 kg     Condition at discharge: fair  The results of significant diagnostics from this hospitalization (including imaging, microbiology, ancillary and laboratory)  are listed below for reference.   Imaging Studies: DG Chest 2 View  Result Date: 11/18/2021 CLINICAL DATA:  Abnormal EKG, shortness of breath EXAM: CHEST - 2 VIEW COMPARISON:  09/11/2021 FINDINGS: The heart size and mediastinal contours are within normal limits. Both lungs are clear. Status post left shoulder reverse arthroplasty. IMPRESSION: No acute abnormality of the lungs. Electronically Signed   By: Delanna Ahmadi M.D.   On: 11/18/2021 12:03    Microbiology: Results for orders placed or performed during the hospital encounter of 11/18/21  Resp Panel by RT-PCR (Flu A&B, Covid) Nasopharyngeal Swab     Status: None   Collection Time: 11/18/21  2:34 PM   Specimen: Nasopharyngeal Swab; Nasopharyngeal(NP) swabs in vial transport medium  Result Value Ref Range Status   SARS Coronavirus 2 by RT PCR NEGATIVE NEGATIVE Final    Comment: (NOTE) SARS-CoV-2 target nucleic acids are NOT DETECTED.  The SARS-CoV-2 RNA is generally detectable in upper respiratory specimens during the acute phase of infection. The lowest concentration of SARS-CoV-2 viral copies this assay can detect is 138 copies/mL. A negative result  does not preclude SARS-Cov-2 infection and should not be used as the sole basis for treatment or other patient management decisions. A negative result may occur with  improper specimen collection/handling, submission of specimen other than nasopharyngeal swab, presence of viral mutation(s) within the areas targeted by this assay, and inadequate number of viral copies(<138 copies/mL). A negative result must be combined with clinical observations, patient history, and epidemiological information. The expected result is Negative.  Fact Sheet for Patients:  EntrepreneurPulse.com.au  Fact Sheet for Healthcare Providers:  IncredibleEmployment.be  This test is no t yet approved or cleared by the [place] FDA and  has been authorized for detection and/or diagnosis of SARS-CoV-2 by FDA under an Emergency Use Authorization (EUA). This EUA will remain  in effect (meaning this test can be used) for the duration of the COVID-19 declaration under Section 564(b)(1) of the Act, 21 U.S.C.section 360bbb-3(b)(1), unless the authorization is terminated  or revoked sooner.       Influenza A by PCR NEGATIVE NEGATIVE Final   Influenza B by PCR NEGATIVE NEGATIVE Final    Comment: (NOTE) The Xpert Xpress SARS-CoV-2/FLU/RSV plus assay is intended as an aid in the diagnosis of influenza from Nasopharyngeal swab specimens and should not be used as a sole basis for treatment. Nasal washings and aspirates are unacceptable for Xpert Xpress SARS-CoV-2/FLU/RSV testing.  Fact Sheet for Patients: EntrepreneurPulse.com.au  Fact Sheet for Healthcare Providers: IncredibleEmployment.be  This test is not yet approved or cleared by the [place] FDA and has been authorized for detection and/or diagnosis of SARS-CoV-2 by FDA under an Emergency Use Authorization (EUA). This EUA will remain in effect (meaning this test can be used) for  the duration of the COVID-19 declaration under Section 564(b)(1) of the Act, 21 U.S.C. section 360bbb-3(b)(1), unless the authorization is terminated or revoked.  Performed at [facility], [address] [place], [place] [address]   Body fluid culture w Gram Stain     Status: None (Preliminary result)   Collection Time: 11/18/21  3:31 PM   Specimen: Peritoneal Washings; Body Fluid  Result Value Ref Range Status   Specimen Description   Final    PERITONEAL Performed at [facility], [address], [place], [place] [address]    Special Requests   Final    NONE Performed at [facility], [address] [place], [place] [address]  Gram Stain NO WBC SEEN NO ORGANISMS SEEN   Final   Culture   Final    NO GROWTH < 12 HOURS Performed at [facility], [place] [address], [place], [place] [address]    Report Status PENDING  Incomplete    Labs: CBC: Recent Labs  Lab 11/18/21 1013 11/18/21 1130 11/19/21 0402  WBC 3.8 4.1 3.5*  NEUTROABS 3.3  --   --   HGB 7.8* 7.8* 7.9*  HCT 22.9* 25.2* 24.3*  MCV 91 96.6 90.7  PLT 132* 122* 681*   Basic Metabolic Panel: Recent Labs  Lab 11/18/21 1130 11/18/21 1315 11/18/21 1733 11/19/21 0001 11/19/21 0402  NA 140 135 135 136 138  K 5.6* 5.7* 5.7* 4.4 3.9  CL 105 103 105 104 104  CO2 19* 17* 17* 19* 20*  GLUCOSE 116* 91 74 94 96  BUN 58* 67* 67* 69* 67*  CREATININE 7.08* 8.96* 8.93* 8.34* 8.15*  CALCIUM 8.5* 8.1* 8.1* 8.0* 8.2*   Liver Function Tests: Recent Labs  Lab 11/18/21 1013  AST 7  ALT 9  ALKPHOS 50  BILITOT 0.5  PROT 5.6*  ALBUMIN 3.5*   CBG: No results for input(s): GLUCAP in the last 168 hours.  Discharge time spent: greater than 30 minutes.  Signed: Fritzi Mandes, MD [facility] 11/19/2021

## 2021-11-19 NOTE — Evaluation (Signed)
Physical Therapy Evaluation Patient Details Name: James Arnold MRN: [id number] DOB: 1956-07-21 Today's Date: 11/19/2021  History of Present Illness  Pt admitted [facility]21 under observation from PCP for SOB and changes on EKG. Endorses dizziness for severeal months, mostly with exertion and positional changes. 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 Imaging negative for acute abnormality. s/p 1 unit PRBC and found to have hyperkalemia upon admission. Elevated troponin believed to be due to CKD and PD.   Clinical Impression  Pt is a 66 year old M admitted to hospital on 11/18/21 for dizziness. At baseline, pt is independent with all ADL's, IADL's, community ambulation without AD, and driving. Notes intermittent dizziness episodes over the past month that come and go, and are described as "room spinning." Pt presents with bil shoulder weakness/limited AROM (R>L) at baseline, mild hip weakness bil, mild impairments in standing balance, and intermittent dizzy episodes. Pt grossly Ind-mod I for bed mobility and transfers, but was provided supervision for safety with gait due to mild balance/gait impairments. Pt notes mobility is at baseline, with exception of dizzy episodes.   Vestibular screen performed twice. Initially, mild saccadic corrections and impaired smooth tracking noted during smooth pursuits and saccades indicating possible central vestibular pathology. 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 R Dix-Hallpike positive with nystagmus; hard to orient direction of nystagmus due to limited eye visibility, however, believed to be down-beating torsional nystagmus which could also indicate central vestibular involvement. L Dix-Hallpike negative and R Dix-Hallpike negative after retest. Saccades and smooth pursuits testing again after pt ambulated to  bathroom and experienced dizziness episode, with pt presenting with increased saccadic corrections and decreased smooth tracking, as well as c/o R orbital/forehead tightness (also with c/o R sided HA). Orthostatics negative during session and pt asymptomatic during testing. Due to vestibular screen/tests it is believed that pt has central vestibular impairment vs. Bil hypofunction, however further testing required. Pt will benefit from ENT consult; attending MD notified.  Current deficits limit safety with mobility while pt is symptomatic. Pt will continue to benefit from skilled acute PT services if symptomatic, for management of symptoms and decreased risk of falls. Pt appropriate to ambulate with nursing/mobility specialists while hospitalized. Pt states that before hospital admission, he was referred to cardiac rehab due to recent MI. At this time, PT recommends DC home with referral to cardiac rehab. Pt may also benefit from referral to OPPT for vestibular rehab if symptoms persist; will benefit from ENT consult/eval first.        Recommendations for follow up therapy are one component of a multi-disciplinary discharge planning process, led by the attending physician.  Recommendations may be updated based on patient status, additional functional criteria and insurance authorization.  Follow Up Recommendations Outpatient PT (cardiac rehab; may require OPPT for vestibular rehab if symptoms persist)    Assistance Recommended at Discharge PRN     Equipment Recommendations None recommended by PT     Functional Status Assessment Patient has had a recent decline in their functional status and demonstrates the ability to make significant improvements in function in a reasonable and predictable amount of time.     Precautions / Restrictions Precautions Precautions: Fall Restrictions Weight Bearing Restrictions: No Other Position/Activity Restrictions: O2 >/= 92%      Mobility  Bed  Mobility Overal bed mobility: Independent  General bed mobility comments: sit EOB, HOB elevated    Transfers Overall transfer level: Modified independent Equipment used: None               General transfer comment: perform STS from EOB and recliner, no AD, use of BUE for assist    Ambulation/Gait Ambulation/Gait assistance: Supervision Gait Distance (Feet): 160 Feet Assistive device: None         General Gait Details: Supervision for safety; reciprocal gait pattern with intermittent scissoring gait resulting in mild LOB which he was able to self correct. Increased unsteadiness noted with head turns and with mild/gentle turning around corners. States that his balance feels off because he's been laying down and hasn't been up as much     Balance Overall balance assessment: Mild deficits observed, not formally tested (see mobility section for details)                                           Pertinent Vitals/Pain Pain Assessment Pain Assessment: 0-10 Pain Score: 6  Pain Location: R HA Pain Descriptors / Indicators: Headache Pain Intervention(s): Monitored during session    Home Living Family/patient expects to be discharged to:: Private residence Living Arrangements: Spouse/significant other;Children Available Help at Discharge: Family;Available 24 hours/day (son works but spouse able to provide 24/7 care if needed) Type of Home: House Home Access: Level entry (threshold step)       Home Layout: One level Home Equipment: Conservation officer, nature (2 wheels);Cane - quad;Cane - single point;BSC/3in1;Wheelchair - manual;Shower seat;Grab bars - tub/shower      Prior Function Prior Level of Function : Independent/Modified Independent;Driving             Mobility Comments: Ind with community ambulation no AD ADLs Comments: Ind with ADL's; spouse performs IADL's but pt able to assist if needed. Spouse assists with medication set up.      Hand Dominance   Dominant Hand: Right    Extremity/Trunk Assessment   Upper Extremity Assessment Upper Extremity Assessment: Overall WFL for tasks assessed (limited bil shoulder flexion (chronic, R>L) with MMT at 3-/5; otherwise grossly 4+/5. sensation intact)    Lower Extremity Assessment Lower Extremity Assessment: Overall WFL for tasks assessed (hip flexion 4/5; otherwise grossly 4+/5. sensation and coordination intact)       Communication   Communication: No difficulties  Cognition Arousal/Alertness: Awake/alert Behavior During Therapy: WFL for tasks assessed/performed Overall Cognitive Status: Within Functional Limits for tasks assessed                                 General Comments: A&O x4; able to follow 100% of multistep commands        General Comments General comments (skin integrity, edema, etc.): Central vestibular screen (saccades and smooth pursuits) positive for saccadic corrections and impaired smooth tracking. 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 R dix-hallpike positive with what seems like downbeating torsional nystagmus (hard to tell due to eyes trying to close); L Dix-hallpike negative. R dix-hallpike negative after retest.    Exercises Other Exercises Other Exercises: Bed mobility, transfers, and gait without AD. Grossly Ind-supervision for safety, mild gait/balance deficits noted but pt believed to be at baseline. 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 Retest negative for dizziness/nystagmus. Other Exercises: Educated re:  PT role/POC, DC recommendations, A&P of vestibular system, vestibular testing, prognosis, safety with mobility, follow up with ENT/vestibular rehab, cardiac rehab. He verbalized understanding.   Assessment/Plan    PT Assessment Patient needs continued PT services  PT Problem List Decreased strength;Decreased  range of motion;Decreased balance (vestibular impairment)       PT Treatment Interventions Gait training;Neuromuscular re-education;Functional mobility training    PT Goals (Current goals can be found in the Care Plan section)  Acute Rehab PT Goals Patient Stated Goal: "get this taken care of" PT Goal Formulation: With patient Time For Goal Achievement: 12/03/21 Potential to Achieve Goals: Good Additional Goals Additional Goal #1: Pt will be asymptomatic with vestibular screen/testing, and/or will be able to independently manage symptoms for improved safety with functional mobility and decreased caregiver burden at DC.    Frequency Min 2X/week        AM-PAC PT "6 Clicks" Mobility  Outcome Measure Help needed turning from your back to your side while in a flat bed without using bedrails?: None Help needed moving from lying on your back to sitting on the side of a flat bed without using bedrails?: None Help needed moving to and from a bed to a chair (including a wheelchair)?: A Little Help needed standing up from a chair using your arms (e.g., wheelchair or bedside chair)?: A Little Help needed to walk in hospital room?: A Little Help needed climbing 3-5 steps with a railing? : A Little 6 Click Score: 20    End of Session Equipment Utilized During Treatment: Gait belt Activity Tolerance: Patient tolerated treatment well Patient left: in chair;with call bell/phone within reach Nurse Communication: Mobility status PT Visit Diagnosis: Unsteadiness on feet (R26.81);Other symptoms and signs involving the nervous system (R29.898)    Time: 4098-1191 PT Time Calculation (min) (ACUTE ONLY): 42 min   Charges:   PT Evaluation $PT Eval Low Complexity: 1 Low PT Treatments $Neuromuscular Re-education: 8-22 mins       Herminio Commons, PT, DPT 11:56 AM,11/19/21

## 2021-11-19 NOTE — Progress Notes (Signed)
Discharge instructions reviewed with patient/ no further questions and concerns at this time. PIV and tele removed. No complications. Patient was given Ointment and gauze for dressing change around PD site. Patient states his friend James Arnold will be picking him up.

## 2021-11-19 NOTE — Progress Notes (Signed)
[facility]  ROUNDING NOTE   Subjective:   [name] was admitted to [facility] on 11/18/2021 for Hyperkalemia [E87.5] Dizziness [R42] Symptomatic anemia [D64.9]  Last peritoneal dialysis treatment was last night  Patient was seen by my partner, Dr. Holley Arnold on 11/14/21.  Patient seen sitting at side of bed, working with PT Alert and oriented Denies pain and discomfort Patient seen later sitting up in chair   Objective:  Vital signs in last 24 hours:  Temp:  [97.6 F (36.4 C)-98.2 F (36.8 C)] 97.6 F (36.4 C) (02/28 0852) Pulse Rate:  [81-92] 88 (02/28 0853) Resp:  [16-18] 18 (02/28 0853) BP: (125-144)/(49-74) 128/55 (02/28 0853) SpO2:  [95 %-100 %] 100 % (02/28 0853) Weight:  [69.3 kg-70.2 kg] 69.3 kg (02/28 0500)  Weight change:  Filed Weights   11/18/21 1113 11/18/21 1849 11/19/21 0500  Weight: 67.1 kg 70.2 kg 69.3 kg    Intake/Output: I/O last 3 completed shifts: In: 560.2 [P.O.:240; Blood:320.2] Out: 300 [Urine:300]   Intake/Output this shift:  Total I/O In: 83382 [P.O.:480; Other:11000] Out: 10977 [[id number]]  Physical Exam: General: NAD, sitting in chair  Head: Normocephalic, atraumatic. Moist oral mucosal membranes  Eyes: Anicteric  Lungs:  Clear to auscultation, normal effort  Heart: Regular rate and rhythm  Abdomen:  Soft, nontender  Extremities:  no peripheral edema.  Neurologic: Nonfocal, moving all four extremities  Skin: No lesions  Access: PD catheter    Basic Metabolic Panel: Recent Labs  Lab 11/18/21 1130 11/18/21 1315 11/18/21 1733 11/19/21 0001 11/19/21 0402  NA 140 135 135 136 138  K 5.6* 5.7* 5.7* 4.4 3.9  CL 105 103 105 104 104  CO2 19* 17* 17* 19* 20*  GLUCOSE 116* 91 74 94 96  BUN 58* 67* 67* 69* 67*  CREATININE 7.08* 8.96* 8.93* 8.34* 8.15*  CALCIUM 8.5* 8.1* 8.1* 8.0* 8.2*     Liver Function Tests: Recent Labs  Lab 11/18/21 1013  AST 7  ALT 9  ALKPHOS 50  BILITOT 0.5  PROT 5.6*  ALBUMIN  3.5*   No results for input(s): LIPASE, AMYLASE in the last 168 hours. No results for input(s): AMMONIA in the last 168 hours.  CBC: Recent Labs  Lab 11/18/21 1013 11/18/21 1130 11/19/21 0402  WBC 3.8 4.1 3.5*  NEUTROABS 3.3  --   --   HGB 7.8* 7.8* 7.9*  HCT 22.9* 25.2* 24.3*  MCV 91 96.6 90.7  PLT 132* 122* 120*     Cardiac Enzymes: No results for input(s): CKTOTAL, CKMB, CKMBINDEX, TROPONINI in the last 168 hours.  BNP: Invalid input(s): POCBNP  CBG: No results for input(s): GLUCAP in the last 168 hours.  Microbiology: Results for orders placed or performed during the hospital encounter of 11/18/21  Resp Panel by RT-PCR (Flu A&B, Covid) Nasopharyngeal Swab     Status: None   Collection Time: 11/18/21  2:34 PM   Specimen: Nasopharyngeal Swab; Nasopharyngeal(NP) swabs in vial transport medium  Result Value Ref Range Status   SARS Coronavirus 2 by RT PCR NEGATIVE NEGATIVE Final    Comment: (NOTE) SARS-CoV-2 target nucleic acids are NOT DETECTED.  The SARS-CoV-2 RNA is generally detectable in upper respiratory specimens during the acute phase of infection. The lowest concentration of SARS-CoV-2 viral copies this assay can detect is 138 copies/mL. A negative result does not preclude SARS-Cov-2 infection and should not be used as the sole basis for treatment or other patient management decisions. A negative result may occur with  improper specimen collection/handling, submission of specimen other than nasopharyngeal swab, presence of viral mutation(s) within the areas targeted by this assay, and inadequate number of viral copies(<138 copies/mL). A negative result must be combined with clinical observations, patient history, and epidemiological information. The expected result is Negative.  Fact Sheet for Patients:  EntrepreneurPulse.com.au  Fact Sheet for Healthcare Providers:  IncredibleEmployment.be  This test is no t yet  approved or cleared by the [place] FDA and  has been authorized for detection and/or diagnosis of SARS-CoV-2 by FDA under an Emergency Use Authorization (EUA). This EUA will remain  in effect (meaning this test can be used) for the duration of the COVID-19 declaration under Section 564(b)(1) of the Act, 21 U.S.C.section 360bbb-3(b)(1), unless the authorization is terminated  or revoked sooner.       Influenza A by PCR NEGATIVE NEGATIVE Final   Influenza B by PCR NEGATIVE NEGATIVE Final    Comment: (NOTE) The Xpert Xpress SARS-CoV-2/FLU/RSV plus assay is intended as an aid in the diagnosis of influenza from Nasopharyngeal swab specimens and should not be used as a sole basis for treatment. Nasal washings and aspirates are unacceptable for Xpert Xpress SARS-CoV-2/FLU/RSV testing.  Fact Sheet for Patients: EntrepreneurPulse.com.au  Fact Sheet for Healthcare Providers: IncredibleEmployment.be  This test is not yet approved or cleared by the [place] FDA and has been authorized for detection and/or diagnosis of SARS-CoV-2 by FDA under an Emergency Use Authorization (EUA). This EUA will remain in effect (meaning this test can be used) for the duration of the COVID-19 declaration under Section 564(b)(1) of the Act, 21 U.S.C. section 360bbb-3(b)(1), unless the authorization is terminated or revoked.  Performed at [facility], [address] [place], [place] [address]   Body fluid culture w Gram Stain     Status: None (Preliminary result)   Collection Time: 11/18/21  3:31 PM   Specimen: Peritoneal Washings; Body Fluid  Result Value Ref Range Status   Specimen Description   Final    PERITONEAL Performed at [facility], [address] [place], [place] [address]    Special Requests   Final    NONE Performed at [facility], [place], [place] [address]    Gram Stain NO WBC SEEN NO  ORGANISMS SEEN   Final   Culture   Final    NO GROWTH < 12 HOURS Performed at [facility], [place] [address], [place], [place] [address]    Report Status PENDING  Incomplete    Coagulation Studies: No results for input(s): LABPROT, INR in the last 72 hours.  Urinalysis: Recent Labs    11/18/21 1001  GLUCOSEU Negative  BILIRUBINUR Negative  PROTEINUR 2+*  NITRITE Negative  LEUKOCYTESUR Negative       Imaging: DG Chest 2 View  Result Date: 11/18/2021 CLINICAL DATA:  Abnormal EKG, shortness of breath EXAM: CHEST - 2 VIEW COMPARISON:  09/11/2021 FINDINGS: The heart size and mediastinal contours are within normal limits. Both lungs are clear. Status post left shoulder reverse arthroplasty. IMPRESSION: No acute abnormality of the lungs. Electronically Signed   By: Delanna Ahmadi M.D.   On: 11/18/2021 12:03     Medications:    sodium chloride     dialysis solution 1.5% low-MG/low-CA      aspirin  81 mg Oral Daily   atorvastatin  40 mg Oral Daily   carvedilol  12.5 mg Oral BID   citalopram  20 mg Oral Daily  clopidogrel  75 mg Oral Daily   insulin aspart  5 Units Intravenous Once   And   dextrose  1 ampule Intravenous Once   furosemide  20 mg Oral Daily   gentamicin cream  1 application Topical Daily   heparin  5,000 Units Subcutaneous Q12H   levETIRAcetam  500 mg Oral QHS   nortriptyline  25 mg Oral QHS   pantoprazole  20 mg Oral Daily   predniSONE  10 mg Oral Q breakfast   sodium chloride flush  3 mL Intravenous Q12H   tamsulosin  0.8 mg Oral Daily     Assessment/ Plan:  James Arnold is a 66 y.o. 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 Dialysis for tonight.  - PD Orders prepared.  - Potassium improved - not currently on an ACE-I/ARB  Hypertension: 127/62. Home regimen of furosemide, tamsulosin and carvedilol.  - monitor blood pressures, stable at 128/55  Anemia of chronic kidney disease: hemoglobin 7.9. WIth history of myelofibrosis.  - PRBC transfusion received yesterday - hematology consult deferred due to recommended blood transfusions and ESA's  Secondary Hyperparathyroidism with hyperphosphatemia and hypocalcemia:  - calcium acetate with meals.    LOS: [place] 2/28/20232:44 PM

## 2021-11-19 NOTE — Discharge Instructions (Signed)
Patient advised to resume his peritoneal dialysis as his routine he is recommended to take as needed meclizine. He will need to follow-up with ENT Dr. Tami Ribas as outpatient. Patient will resume his scheduled appointments with Dr. Rogue Bussing patient will also go for outpatient physical therapy cardiac and vestibular

## 2021-11-19 NOTE — Progress Notes (Signed)
° ° ° °          [place] REFERRAL        Occupational Therapy * Physical Therapy * Speech Therapy                           DATE 11/19/21  PATIENT NAME James Arnold_     PATIENT MRN [id number]       DIAGNOSIS/DIAGNOSIS CODE [place], which he was positive for L posterior canal   DATE OF DISCHARGE: 11/19/21       PRIMARY CARE PHYSICIAN Remi Lopata    PCP PHONE/FAX [phone]      Dear Provider (Name: [facility] outpatient __  Fax: [phone]   I certify that I have examined this patient and that occupational/physical/speech therapy is necessary on an outpatient basis.    The patient has expressed interest in completing their recommended course of therapy at your  location.  Once a formal order from the patient's primary care physician has been obtained, please  contact him/her to schedule an appointment for evaluation at your earliest convenience.   [ x ]  Physical Therapy Evaluate and Treat          [  ]  Occupational Therapy Evaluate and Treat                                                             [  ]  Speech Therapy Evaluate and Treat         The patient's primary care physician (listed above) must furnish and be responsible for a formal order such that the recommended services may be furnished while under the primary physician's care, and that the plan of care will be established and reviewed every 30 days (or more often if condition necessitates).   _________________________________________________                             ____________                                Hospitalist/Attending Physician Signature  NOTE:  Diagnosis and signature are required to validate order                 Date                                                  ____________________________________________________________________ Printed Hospitalist/Attending Physician Name

## 2021-11-20 ENCOUNTER — Telehealth: Payer: Self-pay | Admitting: *Deleted

## 2021-11-20 DIAGNOSIS — Z992 Dependence on renal dialysis: Secondary | ICD-10-CM | POA: Diagnosis not present

## 2021-11-20 DIAGNOSIS — N186 End stage renal disease: Secondary | ICD-10-CM | POA: Diagnosis not present

## 2021-11-20 NOTE — Telephone Encounter (Signed)
Transition Care Management Follow-up Telephone Call ?Date of discharge and from where: [facility]   11-19-2021 ?How have you been since you were released from the hospital? Per wife patient is about the same ?Any questions or concerns? No ? ?Items Reviewed: ?Did the pt receive and understand the discharge instructions provided? Yes  ?Medications obtained and verified? Yes  ?Other? No  ?Any new allergies since your discharge? No  ?Dietary orders reviewed? No ?Do you have support at home? Yes  ? ?Home Care and Equipment/Supplies: ?Were home health services ordered? not applicable ?If so, what is the name of the agency?   ?Has the agency set up a time to come to the patient's home? not applicable ?Were any new equipment or medical supplies ordered?  No ?What is the name of the medical supply agency?  ?Were you able to get the supplies/equipment? not applicable ?Do you have any questions related to the use of the equipment or supplies? No ? ?Functional Questionnaire: (I = Independent and D = Dependent) ?ADLs: I  ? ?Bathing/Dressing- I ? ?Meal Prep- I ? ?Eating- I ? ?Maintaining continence- I ? ?Transferring/Ambulation- I ? ?Managing Meds- I ? ?Follow up appointments reviewed: ? ?PCP Hospital f/u appt confirmed? Yes  Scheduled to see 11-26-2021 on 11:20 @   wife states she didn't think that would work for her and husband states he will not do a follow up.  Discussed importance of follow up , would not reschedule. ?[facility] f/u appt confirmed?   ?Are transportation arrangements needed? No  ?If their condition worsens, is the pt aware to call PCP or go to the Emergency Dept.? Yes ?Was the patient provided with contact information for the PCP's office or ED? Yes ?Was to pt encouraged to call back with questions or concerns? Yes  ?

## 2021-11-21 DIAGNOSIS — Z992 Dependence on renal dialysis: Secondary | ICD-10-CM | POA: Diagnosis not present

## 2021-11-21 DIAGNOSIS — N186 End stage renal disease: Secondary | ICD-10-CM | POA: Diagnosis not present

## 2021-11-22 DIAGNOSIS — Z992 Dependence on renal dialysis: Secondary | ICD-10-CM | POA: Diagnosis not present

## 2021-11-22 DIAGNOSIS — N186 End stage renal disease: Secondary | ICD-10-CM | POA: Diagnosis not present

## 2021-11-22 LAB — BODY FLUID CULTURE W GRAM STAIN
Culture: NO GROWTH
Gram Stain: NONE SEEN

## 2021-11-24 DIAGNOSIS — Z992 Dependence on renal dialysis: Secondary | ICD-10-CM | POA: Diagnosis not present

## 2021-11-24 DIAGNOSIS — N186 End stage renal disease: Secondary | ICD-10-CM | POA: Diagnosis not present

## 2021-11-25 ENCOUNTER — Inpatient Hospital Stay: Payer: Medicare HMO | Attending: Internal Medicine

## 2021-11-25 ENCOUNTER — Other Ambulatory Visit: Payer: Self-pay

## 2021-11-25 ENCOUNTER — Inpatient Hospital Stay: Payer: Medicare HMO

## 2021-11-25 ENCOUNTER — Encounter: Payer: Self-pay | Admitting: Internal Medicine

## 2021-11-25 ENCOUNTER — Inpatient Hospital Stay (HOSPITAL_BASED_OUTPATIENT_CLINIC_OR_DEPARTMENT_OTHER): Payer: Medicare HMO | Admitting: Internal Medicine

## 2021-11-25 DIAGNOSIS — J432 Centrilobular emphysema: Secondary | ICD-10-CM | POA: Insufficient documentation

## 2021-11-25 DIAGNOSIS — N186 End stage renal disease: Secondary | ICD-10-CM | POA: Insufficient documentation

## 2021-11-25 DIAGNOSIS — Z8041 Family history of malignant neoplasm of ovary: Secondary | ICD-10-CM | POA: Diagnosis not present

## 2021-11-25 DIAGNOSIS — D649 Anemia, unspecified: Secondary | ICD-10-CM | POA: Diagnosis not present

## 2021-11-25 DIAGNOSIS — Z803 Family history of malignant neoplasm of breast: Secondary | ICD-10-CM | POA: Diagnosis not present

## 2021-11-25 DIAGNOSIS — I252 Old myocardial infarction: Secondary | ICD-10-CM | POA: Insufficient documentation

## 2021-11-25 DIAGNOSIS — Z801 Family history of malignant neoplasm of trachea, bronchus and lung: Secondary | ICD-10-CM | POA: Diagnosis not present

## 2021-11-25 DIAGNOSIS — R42 Dizziness and giddiness: Secondary | ICD-10-CM | POA: Insufficient documentation

## 2021-11-25 DIAGNOSIS — J449 Chronic obstructive pulmonary disease, unspecified: Secondary | ICD-10-CM | POA: Diagnosis not present

## 2021-11-25 DIAGNOSIS — D471 Chronic myeloproliferative disease: Secondary | ICD-10-CM | POA: Insufficient documentation

## 2021-11-25 DIAGNOSIS — I5022 Chronic systolic (congestive) heart failure: Secondary | ICD-10-CM | POA: Insufficient documentation

## 2021-11-25 DIAGNOSIS — Z87891 Personal history of nicotine dependence: Secondary | ICD-10-CM | POA: Insufficient documentation

## 2021-11-25 DIAGNOSIS — I132 Hypertensive heart and chronic kidney disease with heart failure and with stage 5 chronic kidney disease, or end stage renal disease: Secondary | ICD-10-CM | POA: Diagnosis not present

## 2021-11-25 DIAGNOSIS — Z800 Family history of malignant neoplasm of digestive organs: Secondary | ICD-10-CM | POA: Insufficient documentation

## 2021-11-25 DIAGNOSIS — R161 Splenomegaly, not elsewhere classified: Secondary | ICD-10-CM | POA: Diagnosis not present

## 2021-11-25 DIAGNOSIS — D7581 Myelofibrosis: Secondary | ICD-10-CM | POA: Diagnosis not present

## 2021-11-25 DIAGNOSIS — H8112 Benign paroxysmal vertigo, left ear: Secondary | ICD-10-CM | POA: Diagnosis not present

## 2021-11-25 DIAGNOSIS — Z992 Dependence on renal dialysis: Secondary | ICD-10-CM | POA: Diagnosis not present

## 2021-11-25 LAB — CBC WITH DIFFERENTIAL/PLATELET
Abs Immature Granulocytes: 0.02 10*3/uL (ref 0.00–0.07)
Basophils Absolute: 0 10*3/uL (ref 0.0–0.1)
Basophils Relative: 0 %
Eosinophils Absolute: 0.1 10*3/uL (ref 0.0–0.5)
Eosinophils Relative: 1 %
HCT: 29.8 % — ABNORMAL LOW (ref 39.0–52.0)
Hemoglobin: 9.5 g/dL — ABNORMAL LOW (ref 13.0–17.0)
Immature Granulocytes: 0 %
Lymphocytes Relative: 13 %
Lymphs Abs: 0.6 10*3/uL — ABNORMAL LOW (ref 0.7–4.0)
MCH: 30.1 pg (ref 26.0–34.0)
MCHC: 31.9 g/dL (ref 30.0–36.0)
MCV: 94.3 fL (ref 80.0–100.0)
Monocytes Absolute: 0.2 10*3/uL (ref 0.1–1.0)
Monocytes Relative: 4 %
Neutro Abs: 3.9 10*3/uL (ref 1.7–7.7)
Neutrophils Relative %: 82 %
Platelets: 173 10*3/uL (ref 150–400)
RBC: 3.16 MIL/uL — ABNORMAL LOW (ref 4.22–5.81)
RDW: 15.2 % (ref 11.5–15.5)
WBC: 4.8 10*3/uL (ref 4.0–10.5)
nRBC: 0 % (ref 0.0–0.2)

## 2021-11-25 LAB — COMPREHENSIVE METABOLIC PANEL
ALT: 17 U/L (ref 0–44)
AST: 13 U/L — ABNORMAL LOW (ref 15–41)
Albumin: 3.2 g/dL — ABNORMAL LOW (ref 3.5–5.0)
Alkaline Phosphatase: 44 U/L (ref 38–126)
Anion gap: 13 (ref 5–15)
BUN: 57 mg/dL — ABNORMAL HIGH (ref 8–23)
CO2: 21 mmol/L — ABNORMAL LOW (ref 22–32)
Calcium: 7.4 mg/dL — ABNORMAL LOW (ref 8.9–10.3)
Chloride: 105 mmol/L (ref 98–111)
Creatinine, Ser: 7.88 mg/dL — ABNORMAL HIGH (ref 0.61–1.24)
GFR, Estimated: 7 mL/min — ABNORMAL LOW (ref >60)
Glucose, Bld: 86 mg/dL (ref 70–99)
Potassium: 4.3 mmol/L (ref 3.5–5.1)
Sodium: 139 mmol/L (ref 135–145)
Total Bilirubin: 0.3 mg/dL (ref 0.3–1.2)
Total Protein: 6 g/dL — ABNORMAL LOW (ref 6.5–8.1)

## 2021-11-25 LAB — SAMPLE TO BLOOD BANK

## 2021-11-25 LAB — LACTATE DEHYDROGENASE: LDH: 549 U/L — ABNORMAL HIGH (ref 98–192)

## 2021-11-25 MED ORDER — MECLIZINE HCL 12.5 MG PO TABS: 12.5000 mg | ORAL_TABLET | Freq: Two times a day (BID) | ORAL | 2 refills | Status: AC | PRN

## 2021-11-25 NOTE — Progress Notes (Signed)
[place] OFFICE PROGRESS NOTE  Patient Care Team: Valerie Roys, DO as PCP - General (Family Medicine) End, Harrell Gave, MD as PCP - Cardiology (Cardiology) Cammie Sickle, MD as Consulting Physician (Internal Medicine) Dasher, Rayvon Char, MD as Consulting Physician (Dermatology) End, Harrell Gave, MD as Consulting Physician (Cardiology) Anthonette Legato, MD (Nephrology)   Cancer Staging  No matching staging information was found for the patient.   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   # AUGUST 1st week 2019- Retacrit   # October 2nd 2019- jakafi 10 BID [? Renal involvement]; September 2020-peritoneal dialysis; Jakafi 10 mg in the morning; STOPPED/tapered July8th, 2021 [sec intolerance fatigue/myalgias]  # May 4th 2022- START [name] dosing/PD]; STOPPED after2-3 weeks-because of poor tolerance [sever fatigue/? anemia]; NOV 2022-declined Hydrea.  # DEC 2022- NSTEMI- sever 3 vessel CAD- transferred to [facility].  Patient underwent cardiac catheterization/stenting.  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 [name]- kidney Bx- FSG [ on Prednisone Dr.[name]]; July 2020-peritoneal dialysis;   # hx of Lung nodules- resolved [[name]]/quit smoking.  DIAGNOSIS: PRIMARY MYELOFIBROSIS  RISK:LOW     ;GOALS: Control  CURRENT/MOST RECENT THERAPY : Surveillance    Myelofibrosis ([place])      INTERVAL HISTORY:Ambulating independently.  Patient is alone.  I spoke with patient's wife over the phone.  James Arnold 66 y.o.  male pleasant patient above history of primary myelofibrosis Jak 2+ currently off [place] surveillance chronic kidney disease on peritoneal  dialysis; history of CVA is here for follow-up.  Patient was recently admitted to hospital for dizzy spells/noted to have hyperkalemia for which patient was admitted to the hospital for further work-up/treatment.  Patient received 1 unit of PRBC transfusion for hemoglobin 7.8.  Patient awaiting reevaluation with ENT outpatient for vertigo.  Will give a prescription for meclizine.  As per the wife patient has quit smoking since his heart attack in December 2022.   Patient continues to undergoing peritoneal dialysis.  Chronic fatigue.  Appetite is good.  Denies any early satiety.  No chills.  Review of Systems  Constitutional:  Positive for malaise/fatigue. Negative for chills, diaphoresis and fever.  HENT:  Negative for nosebleeds and sore throat.   Eyes:  Negative for double vision.  Respiratory:  Positive for cough and shortness of breath. Negative for hemoptysis, sputum production and wheezing.   Cardiovascular:  Negative for chest pain, palpitations and orthopnea.  Gastrointestinal:  Negative for abdominal pain, blood in stool, constipation, diarrhea, heartburn, melena, nausea and vomiting.  Genitourinary:  Negative for dysuria, frequency and urgency.  Musculoskeletal:  Positive for back pain and joint pain.  Skin: Negative.  Negative for itching and rash.  Neurological:  Positive for dizziness and tingling. Negative for focal weakness, weakness and headaches.  Endo/Heme/Allergies:  Does not bruise/bleed easily.  Psychiatric/Behavioral:  Negative for depression. The patient is not nervous/anxious and does not have insomnia.      PAST MEDICAL HISTORY :  Past Medical History:  Diagnosis Date   Anemia    Benign hypertensive renal disease    CAD (coronary artery disease)    a. 08/1996 s/p BMS to the mLAD ([facility]); b. 12/2017 MV: EF 46%, --> low risk; c. 07/2020 MV: EF 51%, no ischemia/infarct; d. 08/2021 Cath/PCI:  LM nl, LAD 60p, 95p/m ISR (3.0x48 Synergy DES), RI small, nl, LCX 40p,  95d(3.5x16 Synergy DES), OM1 50, RCA 40p/150m   Carotid arterial disease ([place]    a. 11/2020 Carotid U/S: <50% bilat ICA stenoses.   Chronic HFrEF (heart failure with reduced ejection fraction) ([facility]    a. 08/2022 Echo: EF 35-40%.   CKD (chronic kidney disease), stage V ([facility]    on peritoneal dialysis   Complication of anesthesia    please call by "[name]" when waking up!!   COPD (chronic obstructive pulmonary disease) (HCC)    centrilobular emphysema. pt is poorly controlled with his copd/smoking.   Depression    Diastolic dysfunction    a. 12/2017 Echo: EF 60-65%, no rwma, Gr1 DD, mild AI/MR. Nl RVSP; b. 08/2020 Echo: EF 60-65%, no rwma, GrI DD, nl RV fxn. RVSP 32.554mg.   Dyspnea on exertion    with exertion   Fatigue    FSGS (focal segmental glomerulosclerosis)    Hyperlipidemia 10/2002   Hypertension    Ischemic cardiomyopathy    a. 08/2022 Echo: EF 35-40%, glob HK, GrII DD, nl RV fxn, mildly dil LA, mild MR, mild AS.   myelofibrosis    bone marrow failure   Myelofibrosis ([place]   JAK2 (+) primary   Myocardial infarction ([facility]   stent x 1   Pneumoperitoneum 02/2021   PSVT (paroxysmal supraventricular tachycardia) ([facility]   a. 12/2020 Zio: Sinus rhythm 80 (55-117). Rare PACs/PVCs. 17 episodes of SVT (longest 20.3 secs, fastest 184). No triggered events. No afib.   Smoker    [facility]   a. 11/2020 MRI Brain: patchy acute/early subacute cortical infarcts w/in the R frontal, parietal, and occiptal lobes. Small, chronic L basal ganglia lacunar infarct.   Thrombocytopenia ([facility] [place]    PAST SURGICAL HISTORY :   Past Surgical History:  Procedure Laterality Date   ANGIOPLASTY     BONE MARROW ASPIRATION  03/2009   CARDIAC CATHETERIZATION  1997   stent x 1   COLONOSCOPY WITH PROPOFOL N/A 08/09/2018   Procedure: COLONOSCOPY WITH PROPOFOL;  Surgeon: AnJonathon BellowsMD;  [facility];  Service: Gastroenterology;  Laterality: N/A;   CORONARY ARTERY BYPASS GRAFT      CORONARY STENT PLACEMENT  08/1996   ESOPHAGOGASTRODUODENOSCOPY (EGD) WITH PROPOFOL N/A 07/25/2021   Procedure: ESOPHAGOGASTRODUODENOSCOPY (EGD) WITH PROPOFOL;  Surgeon: AnJonathon BellowsMD;  [facility];  Service: Gastroenterology;  Laterality: N/A;   EXTERIORIZATION OF A CONTINUOUS AMBULATORY PERITONEAL DIALYSIS CATHETER     EYE SURGERY Bilateral    cats removed   LEFT HEART CATH AND CORONARY ANGIOGRAPHY N/A 09/12/2021   Procedure: LEFT HEART CATH AND CORONARY ANGIOGRAPHY;  Surgeon: ArWellington HampshireMD;  [facility];  Service: Cardiovascular;  Laterality: N/A;   REVERSE SHOULDER ARTHROPLASTY Left 11/19/2020   Procedure: Left reverse shoulder arthroplasty;  Surgeon: PaLeim FabryMD;  [facility];  Service: Orthopedics;  Laterality: Left;    FAMILY HISTORY :   Family History  Problem Relation Age of Onset   Stroke Mother        Low BP stroke   Depression Father    Depression Sister        Breast   Heart disease Other    Breast cancer Other    Lung cancer Other    Ovarian cancer Other    Stomach cancer Other     SOCIAL HISTORY:   Social History   Tobacco Use  Smoking status: Former    Packs/day: 0.50    Years: 47.00    Pack years: 23.50    Types: Cigarettes    Quit date: 09/11/2021    Years since quitting: 0.2   Smokeless tobacco: Never   Tobacco comments:    trying to cut back-smokes 6 to 7 cigs per day  Vaping Use   Vaping Use: Never used  Substance Use Topics   Alcohol use: Not Currently    Comment: "6 beers weekly on Saturdays"-quit in July 2022   Drug use: No    ALLERGIES:  has No Known Allergies.  MEDICATIONS:  Current Outpatient Medications  Medication Sig Dispense Refill   albuterol (VENTOLIN HFA) 108 (90 Base) MCG/ACT inhaler Inhale 2 puffs into the lungs every 6 (six) hours as needed for wheezing or shortness of breath. 8 g 2   aspirin 81 MG chewable tablet Chew 1 tablet (81 mg total) by mouth daily. 90 tablet 1    atorvastatin (LIPITOR) 40 MG tablet Take 1 tablet (40 mg total) by mouth daily. 90 tablet 3   carvedilol (COREG) 12.5 MG tablet Take 1 tablet (12.5 mg total) by mouth 2 (two) times daily. 180 tablet 3   Cholecalciferol (VITAMIN D-1000 MAX ST) 25 MCG (1000 UT) tablet Take 1,000 Units by mouth daily.      citalopram (CELEXA) 40 MG tablet Take 1.5 tablets (60 mg total) by mouth daily. 135 tablet 1   clopidogrel (PLAVIX) 75 MG tablet Take 75 mg by mouth daily.     ferrous sulfate 325 (65 FE) MG tablet Take 1 tablet (325 mg total) by mouth daily with breakfast. 30 tablet 1   furosemide (LASIX) 20 MG tablet Take 1 tablet (20 mg total) by mouth daily. 90 tablet 1   gentamicin cream (GARAMYCIN) 0.1 % Apply 1 application topically every other day.     levETIRAcetam (KEPPRA) 500 MG tablet Take 1 tablet (500 mg total) by mouth at bedtime. 30 tablet 0   nitroGLYCERIN (NITROSTAT) 0.4 MG SL tablet Place 1 tablet (0.4 mg total) under the tongue every 5 (five) minutes as needed for chest pain.  12   nortriptyline (PAMELOR) 25 MG capsule Take 1 capsule (25 mg total) by mouth at bedtime. 90 capsule 1   ondansetron (ZOFRAN ODT) 4 MG disintegrating tablet Take 1 tablet (4 mg total) by mouth every 8 (eight) hours as needed for nausea or vomiting. 12 tablet 0   pantoprazole (PROTONIX) 20 MG tablet Take 20 mg by mouth daily.     polyethylene glycol (MIRALAX / GLYCOLAX) 17 g packet Take 17 g by mouth daily as needed. 14 each 0   predniSONE (DELTASONE) 10 MG tablet Take 10 mg by mouth daily with breakfast.     tamsulosin (FLOMAX) 0.4 MG CAPS capsule Take 2 capsules (0.8 mg total) by mouth daily. 60 capsule 3   meclizine (ANTIVERT) 12.5 MG tablet Take 1 tablet (12.5 mg total) by mouth 2 (two) times daily as needed for dizziness. 30 tablet 2   No current facility-administered medications for this visit.    PHYSICAL EXAMINATION: ECOG PERFORMANCE STATUS: 1 - Symptomatic but completely ambulatory  BP 134/75    Pulse 89     Temp 98.7 F (37.1 C)    Resp 20    Wt 151 lb 3.2 oz (68.6 kg)    SpO2 100%    BMI 23.68 kg/m   Filed Weights   11/25/21 0834  Weight: 151 lb 3.2 oz (68.6 kg)  Physical Exam HENT:     Head: Normocephalic and atraumatic.     Mouth/Throat:     Pharynx: No oropharyngeal exudate.  Eyes:     Pupils: Pupils are equal, round, and reactive to light.  Cardiovascular:     Rate and Rhythm: Normal rate and regular rhythm.  Pulmonary:     Effort: No respiratory distress.     Breath sounds: No wheezing.     Comments: Decreased air entry bilaterally. Abdominal:     General: Bowel sounds are normal. There is no distension.     Palpations: Abdomen is soft. There is no mass.     Tenderness: There is no abdominal tenderness. There is no guarding or rebound.     Comments: Positive for splenomegaly.  Musculoskeletal:        General: No tenderness. Normal range of motion.     Cervical back: Normal range of motion and neck supple.  Skin:    General: Skin is warm.  Neurological:     Mental Status: He is alert and oriented to person, place, and time.     Comments: Left upper extremity weakness noted.    Psychiatric:        Mood and Affect: Affect normal.    LABORATORY DATA:  I have reviewed the data as listed    Component Value Date/Time   NA 139 11/25/2021 0802   NA 140 11/18/2021 1013   K 4.3 11/25/2021 0802   CL 105 11/25/2021 0802   CO2 21 (L) 11/25/2021 0802   GLUCOSE 86 11/25/2021 0802   BUN 57 (H) 11/25/2021 0802   BUN 58 (H) 11/18/2021 1013   CREATININE 7.88 (H) 11/25/2021 0802   CREATININE 1.12 09/30/2013 0953   CALCIUM 7.4 (L) 11/25/2021 0802   PROT 6.0 (L) 11/25/2021 0802   PROT 5.6 (L) 11/18/2021 1013   ALBUMIN 3.2 (L) 11/25/2021 0802   ALBUMIN 3.5 (L) 11/18/2021 1013   AST 13 (L) 11/25/2021 0802   ALT 17 11/25/2021 0802   ALKPHOS 44 11/25/2021 0802   BILITOT 0.3 11/25/2021 0802   BILITOT 0.5 11/18/2021 1013   GFRNONAA 7 (L) 11/25/2021 0802   GFRNONAA >60  09/30/2013 0953   GFRAA 14 (L) 07/10/2020 0909   GFRAA >60 09/30/2013 0953    No results found for: SPEP, UPEP  Lab Results  Component Value Date   WBC 4.8 11/25/2021   NEUTROABS 3.9 11/25/2021   HGB 9.5 (L) 11/25/2021   HCT 29.8 (L) 11/25/2021   MCV 94.3 11/25/2021   PLT 173 11/25/2021      Chemistry      Component Value Date/Time   NA 139 11/25/2021 0802   NA 140 11/18/2021 1013   K 4.3 11/25/2021 0802   CL 105 11/25/2021 0802   CO2 21 (L) 11/25/2021 0802   BUN 57 (H) 11/25/2021 0802   BUN 58 (H) 11/18/2021 1013   CREATININE 7.88 (H) 11/25/2021 0802   CREATININE 1.12 09/30/2013 0953      Component Value Date/Time   CALCIUM 7.4 (L) 11/25/2021 0802   ALKPHOS 44 11/25/2021 0802   AST 13 (L) 11/25/2021 0802   ALT 17 11/25/2021 0802   BILITOT 0.3 11/25/2021 0802   BILITOT 0.5 11/18/2021 1013       RADIOGRAPHIC STUDIES: I have personally reviewed the radiological images as listed and agreed with the findings in the report. No results found.   ASSESSMENT & PLAN:  Myelofibrosis ([place]) # Primary Myelofiborisis [Bone marrow 2010] jak-2 POSITIVE;currently on surveillance;  Patient discontinued Jakafi-because of poor tolerance;OCT 1st, 2022-US Splenomegaly with increase in splenic size from prior imaging, current splenic volume 1024cc.  #  patient symptomatic from myelofibrosis given worsening anemia.  However patient/wife declined any therapies including Hydrea given poor tolerance.    Consider momoletinib when approved.   #Anemia hemoglobin- multifactorial is 9.5; [recent admission to hospital March- needing 1 unit PRBC]  Continue EPO stimulating agent /IV iron as per nephrology. HOLD PRBC transfusion today.   #Non-STEMI/ischemic cardiomyopathy-s/p stenting clinically stable.  #COPD-[quits smoking  since dec2022] -continue nebulizer treatments.  #End-stage renal disease [Dr. Latif]  on PD-STABLE;   # Dizzy/vertigo-s/p ENT evaluation in the hospital.  Awaiting ENT  evaluation outpatient.  Will prescribe meclizine.  # DISPOSITION: Am appt.  # HOLD PRBC transfusion # follow up in 1 month-labs- MD-CBC/CMP;LDH;HOLD tube; posssible1 unit PRBC tranfusion- Dr.B  No orders of the defined types were placed in this encounter.   All questions were answered. The patient knows to call the clinic with any problems, questions or concerns.     Cammie Sickle, MD 11/25/2021 7:29 PM

## 2021-11-25 NOTE — Assessment & Plan Note (Addendum)
#   Primary Myelofiborisis [Bone marrow 2010] jak-2 POSITIVE;currently on surveillance; Patient discontinued Jakafi-because of poor tolerance;OCT 1st, 2022-US Splenomegaly with increase in splenic size from prior imaging, current splenic volume 1024cc. ? ?#  patient symptomatic from myelofibrosis given worsening anemia.  However patient/wife declined any therapies including Hydrea given poor tolerance.    Consider momoletinib when approved.  ? ?#Anemia hemoglobin- multifactorial is 9.5; [recent admission to hospital March- needing 1 unit PRBC]  Continue EPO stimulating agent /IV iron as per nephrology. HOLD PRBC transfusion today.  ? ?#Non-STEMI/ischemic cardiomyopathy-s/p stenting clinically stable. ? ?#COPD-[quits smoking  since dec2022] -continue nebulizer treatments. ? ?#End-stage renal disease [Dr. Latif]  on PD-STABLE;  ? ?# Dizzy/vertigo-s/p ENT evaluation in the hospital.  Awaiting ENT evaluation outpatient.  Will prescribe meclizine. ? ?# DISPOSITION: Am appt.  ?# HOLD PRBC transfusion ?# follow up in 1 month-labs- MD-CBC/CMP;LDH;HOLD tube; posssible1 unit PRBC tranfusion- Dr.B ?

## 2021-11-25 NOTE — Progress Notes (Signed)
Patient states no concerns at the moment. 

## 2021-11-26 ENCOUNTER — Inpatient Hospital Stay: Payer: Medicaid Other | Admitting: Family Medicine

## 2021-11-26 DIAGNOSIS — Z992 Dependence on renal dialysis: Secondary | ICD-10-CM | POA: Diagnosis not present

## 2021-11-26 DIAGNOSIS — N186 End stage renal disease: Secondary | ICD-10-CM | POA: Diagnosis not present

## 2021-11-27 ENCOUNTER — Ambulatory Visit: Payer: Self-pay | Admitting: *Deleted

## 2021-11-27 DIAGNOSIS — N186 End stage renal disease: Secondary | ICD-10-CM | POA: Diagnosis not present

## 2021-11-27 DIAGNOSIS — Z992 Dependence on renal dialysis: Secondary | ICD-10-CM | POA: Diagnosis not present

## 2021-11-27 NOTE — Telephone Encounter (Signed)
?  Chief Complaint: Gout flare up in left big toe started yesterday.   He started allopurinol this morning along with Tylenol and an extra prednisone 10 mg (takes it daily for his kidneys), (Wife has call in to kidney dr for more prednisone since he is the one that prescribes the prednisone).   (He usually takes prednisone along with the allopurinol per wife). ? ?Symptoms: pain in left big toe ?Frequency: Started yesterday ?Pertinent Negatives: Patient denies wanting an appt at this time.   Wants to see how the allopurinol and taking extra prednisone does first since this is the regimen he usually takes for flare ups.   He has enough allopurinol to last him when I asked if he needed a refill. ?Disposition: '[]'$ ED /'[]'$ Urgent Care (no appt availability in office) / '[]'$ Appointment(In office/virtual)/ '[]'$  [facility]/ '[x]'$ Home Care/ '[]'$ Refused Recommended Disposition /'[]'$  Mobile Bus/ '[]'$  Follow-up with PCP ?Additional Notes: Will call back if the allopurinol and prednisone does not help.  ?

## 2021-11-27 NOTE — Telephone Encounter (Signed)
I returned call to wife James Arnold.  She is wanting to discuss allopurinol.   He has gout in his left foot. ? ? ?Reason for Disposition ? Pain in the big toe joint ?   Pt has allopurinol that he just started taking this morning.   Has decided to wait and see how it does before making an appt. ? ?Answer Assessment - Initial Assessment Questions ?1. ONSET: "When did the pain start?"  ?    Having gout pain in left foot.   He has allopurinol.   He is on prednisone every day.   I gave him 2 allopurinol and Tylenol and an extra prednisone.    ?Yesterday   He just took the medication a few minutes ago.    He is on prednisone 10 mg for his kidneys.   I gave him an extra prednisone.    ?2. LOCATION: "Where is the pain located?"  ?    Around the big toe.    ?3. PAIN: "How bad is the pain?"    (Scale 1-10; or mild, moderate, severe) ? - MILD (1-3): doesn't interfere with normal activities.  ? - MODERATE (4-7): interferes with normal activities (e.g., work or school) or awakens from sleep, limping.  ? - SEVERE (8-10): excruciating pain, unable to do any normal activities, unable to walk.  ?    *No Answer* ?4. WORK OR EXERCISE: "Has there been any recent work or exercise that involved this part of the body?"  ?    *No Answer* ?5. CAUSE: "What do you think is causing the foot pain?" ?    Left big toe ?6. OTHER SYMPTOMS: "Do you have any other symptoms?" (e.g., leg pain, rash, fever, numbness) ?    *No Answer* ?7. PREGNANCY: "Is there any chance you are pregnant?" "When was your last menstrual period?" ?    *No Answer* ? ?Protocols used: Foot Pain-A-AH ? ?

## 2021-11-28 DIAGNOSIS — N186 End stage renal disease: Secondary | ICD-10-CM | POA: Diagnosis not present

## 2021-11-28 DIAGNOSIS — Z992 Dependence on renal dialysis: Secondary | ICD-10-CM | POA: Diagnosis not present

## 2021-11-29 DIAGNOSIS — N186 End stage renal disease: Secondary | ICD-10-CM | POA: Diagnosis not present

## 2021-11-29 DIAGNOSIS — Z992 Dependence on renal dialysis: Secondary | ICD-10-CM | POA: Diagnosis not present

## 2021-12-01 DIAGNOSIS — Z992 Dependence on renal dialysis: Secondary | ICD-10-CM | POA: Diagnosis not present

## 2021-12-01 DIAGNOSIS — N186 End stage renal disease: Secondary | ICD-10-CM | POA: Diagnosis not present

## 2021-12-02 DIAGNOSIS — Z992 Dependence on renal dialysis: Secondary | ICD-10-CM | POA: Diagnosis not present

## 2021-12-02 DIAGNOSIS — N186 End stage renal disease: Secondary | ICD-10-CM | POA: Diagnosis not present

## 2021-12-02 DIAGNOSIS — H8112 Benign paroxysmal vertigo, left ear: Secondary | ICD-10-CM | POA: Diagnosis not present

## 2021-12-02 DIAGNOSIS — R42 Dizziness and giddiness: Secondary | ICD-10-CM | POA: Diagnosis not present

## 2021-12-03 DIAGNOSIS — Z992 Dependence on renal dialysis: Secondary | ICD-10-CM | POA: Diagnosis not present

## 2021-12-03 DIAGNOSIS — N186 End stage renal disease: Secondary | ICD-10-CM | POA: Diagnosis not present

## 2021-12-04 DIAGNOSIS — D2261 Melanocytic nevi of right upper limb, including shoulder: Secondary | ICD-10-CM | POA: Diagnosis not present

## 2021-12-04 DIAGNOSIS — D225 Melanocytic nevi of trunk: Secondary | ICD-10-CM | POA: Diagnosis not present

## 2021-12-04 DIAGNOSIS — Z992 Dependence on renal dialysis: Secondary | ICD-10-CM | POA: Diagnosis not present

## 2021-12-04 DIAGNOSIS — D692 Other nonthrombocytopenic purpura: Secondary | ICD-10-CM | POA: Diagnosis not present

## 2021-12-04 DIAGNOSIS — Z85828 Personal history of other malignant neoplasm of skin: Secondary | ICD-10-CM | POA: Diagnosis not present

## 2021-12-04 DIAGNOSIS — D2262 Melanocytic nevi of left upper limb, including shoulder: Secondary | ICD-10-CM | POA: Diagnosis not present

## 2021-12-04 DIAGNOSIS — N186 End stage renal disease: Secondary | ICD-10-CM | POA: Diagnosis not present

## 2021-12-05 DIAGNOSIS — N186 End stage renal disease: Secondary | ICD-10-CM | POA: Diagnosis not present

## 2021-12-05 DIAGNOSIS — Z992 Dependence on renal dialysis: Secondary | ICD-10-CM | POA: Diagnosis not present

## 2021-12-06 DIAGNOSIS — N186 End stage renal disease: Secondary | ICD-10-CM | POA: Diagnosis not present

## 2021-12-06 DIAGNOSIS — Z992 Dependence on renal dialysis: Secondary | ICD-10-CM | POA: Diagnosis not present

## 2021-12-08 DIAGNOSIS — Z992 Dependence on renal dialysis: Secondary | ICD-10-CM | POA: Diagnosis not present

## 2021-12-08 DIAGNOSIS — N186 End stage renal disease: Secondary | ICD-10-CM | POA: Diagnosis not present

## 2021-12-09 ENCOUNTER — Ambulatory Visit: Payer: Self-pay

## 2021-12-09 DIAGNOSIS — N186 End stage renal disease: Secondary | ICD-10-CM | POA: Diagnosis not present

## 2021-12-09 DIAGNOSIS — Z992 Dependence on renal dialysis: Secondary | ICD-10-CM | POA: Diagnosis not present

## 2021-12-09 NOTE — Telephone Encounter (Signed)
Pt's wife called saying he was supposed to get a nebulizer sent to him according to his last visit with Dr. Wynetta Emery.  Wife said they have not received anything and Sunday morning he had a bad episode and almost went to the ER.  She said she would like to know if the nebulizer has been ordered yet.  She said he has copd.  ? ?See above request. Please advise pt. ? ?Answer Assessment - Initial Assessment Questions ?1. REASON FOR CALL: "What is the main reason for your call? ?    Pt. Was supposed to receive a nebulizer and they have not heard from anyone in regard to this. ?2. SYMPTOMS: "Does your child have any symptoms?"  ?    Had an episode Sunday of SOB. ?3. OTHER QUESTIONS: "Do you have any other questions?" ?    N/A ? ?- Author's note: IAQ's are intended for training purposes and not meant to be required on every  ?call. ? ?Protocols used: Information Only Call - No Triage-P-AH ? ?

## 2021-12-10 DIAGNOSIS — Z992 Dependence on renal dialysis: Secondary | ICD-10-CM | POA: Diagnosis not present

## 2021-12-10 DIAGNOSIS — N186 End stage renal disease: Secondary | ICD-10-CM | POA: Diagnosis not present

## 2021-12-10 NOTE — Telephone Encounter (Signed)
Please advise. Ok to place order for nebulizer machine?  ?

## 2021-12-11 ENCOUNTER — Telehealth: Payer: Self-pay

## 2021-12-11 DIAGNOSIS — N186 End stage renal disease: Secondary | ICD-10-CM | POA: Diagnosis not present

## 2021-12-11 DIAGNOSIS — Z992 Dependence on renal dialysis: Secondary | ICD-10-CM | POA: Diagnosis not present

## 2021-12-11 NOTE — Telephone Encounter (Signed)
Faxed over order for nebulizer machine to [email or web address].  ?Patient wife is aware.  ?

## 2021-12-11 NOTE — Progress Notes (Signed)
? ?Follow-up Outpatient Visit ?Date: 12/12/2021 ? ?Primary Care Provider: ?Valerie Roys, DO ?[address] ?[place] [address] ? ?Chief Complaint: Follow-up CAD and HFpEF ? ?HPI:  James Arnold is a 66 y.o. male with history of coronary artery disease status post multiple PCI's (most recently LAD and LCx at [facility] in 08/2021), HFrEF, hypertension, hyperlipidemia, stroke, Jonah Nestle-stage renal disease on peritoneal dialysis, COPD, and myelofibrosis, who presents for follow-up of dizziness leading to hospitalization last month.  He also complained of his last visit with [place] in mid February, prompting discontinuation of losartan. ? ?Today, James Arnold reports that he has been feeling quite well after seeing ENT for evaluation of dizziness.  Maneuvers done in the office stopped his dizziness, which has not returned.  He reports an episode of transient shortness of breath last weekend after getting up to use the bathroom.  He used a nebulizer from his aunt's house with resolution of the dyspnea.  He otherwise reports that his breathing has been fairly good.  He denies chest pain, palpitations, lightheadedness, edema, and orthopnea. ? ?-------------------------------------------------------------------------------------------------- ? ?Past Medical History:  ?Diagnosis Date  ? Anemia   ? Benign hypertensive renal disease   ? CAD (coronary artery disease)   ? a. 08/1996 s/p BMS to the mLAD ([facility]); b. 12/2017 MV: EF 46%, --> low risk; c. 07/2020 MV: EF 51%, no ischemia/infarct; d. 08/2021 Cath/PCI: LM nl, LAD 60p, 95p/m ISR (3.0x48 Synergy DES), RI small, nl, LCX 40p, 95d(3.5x16 Synergy DES), OM1 50, RCA 40p/154m  ? Carotid arterial disease (H[place]   ? a. 11/2020 Carotid U/S: <50% bilat ICA stenoses.  ? Chronic HFrEF (heart failure with reduced ejection fraction) (HWest [place]   ? a. 08/2022 Echo: EF 35-40%.  ? CKD (chronic kidney disease), stage V (HOrchard Mes[facility]   ? on peritoneal dialysis  ? Complication of anesthesia   ? please call by "[name]" when  waking up!!  ? COPD (chronic obstructive pulmonary disease) ([place]Co[place]   ? centrilobular emphysema. pt is poorly controlled with his copd/smoking.  ? Depression   ? Diastolic dysfunction   ? a. 12/2017 Echo: EF 60-65%, no rwma, Gr1 DD, mild AI/MR. Nl RVSP; b. 08/2020 Echo: EF 60-65%, no rwma, GrI DD, nl RV fxn. RVSP 32.587mg.  ? Dyspnea on exertion   ? with exertion  ? Fatigue   ? FSGS (focal segmental glomerulosclerosis)   ? Hyperlipidemia 10/2002  ? Hypertension   ? Ischemic cardiomyopathy   ? a. 08/2022 Echo: EF 35-40%, glob HK, GrII DD, nl RV fxn, mildly dil LA, mild MR, mild AS.  ? myelofibrosis   ? bone marrow failure  ? Myelofibrosis (HCEdgar Springs2010  ? JAK2 (+) primary  ? [facility]  ? stent x 1  ? Pneumoperitoneum 02/2021  ? PSVT (paroxysmal supraventricular tachycardia) (HCSattley  ? a. 12/2020 Zio: Sinus rhythm 80 (55-117). Rare PACs/PVCs. 17 episodes of SVT (longest 20.3 secs, fastest 184). No triggered events. No afib.  ? Smoker   ? [facility]  ? a. 11/2020 MRI Brain: patchy acute/early subacute cortical infarcts w/in the R frontal, parietal, and occiptal lobes. Small, chronic L basal ganglia lacunar infarct.  ? Thrombocytopenia (HCWoodbine  ? ?Past Surgical History:  ?Procedure Laterality Date  ? ANGIOPLASTY    ? BONE MARROW ASPIRATION  03/2009  ? CARDIAC CATHETERIZATION  1997  ? stent x 1  ? COLONOSCOPY WITH PROPOFOL N/A 08/09/2018  ? Procedure: COLONOSCOPY WITH PROPOFOL;  Surgeon: AnJonathon BellowsMD;  [facility];  Service: Gastroenterology;  Laterality: N/A;  ? CORONARY ARTERY BYPASS GRAFT    ? CORONARY STENT PLACEMENT  08/1996  ? ESOPHAGOGASTRODUODENOSCOPY (EGD) WITH PROPOFOL N/A 07/25/2021  ? Procedure: ESOPHAGOGASTRODUODENOSCOPY (EGD) WITH PROPOFOL;  Surgeon: [name], MD;  [facility] END[facility];  Service: Gastroenterology;  Laterality: N/A;  ? EXTERIORIZATION OF A CONTINUOUS AMBULATORY PERITONEAL DIALYSIS CATHETER    ? EYE SURGERY Bilateral   ? cats removed  ? LEFT HEART CATH  AND CORONARY ANGIOGRAPHY N/A 09/12/2021  ? Procedure: LEFT HEART CATH AND CORONARY ANGIOGRAPHY;  Surgeon: Wellington Hampshire, MD;  Location: [place] CV LAB;  Service: Cardiovascular;  Laterality: N/A;  ? REVERSE SHOULDER ARTHROPLASTY Left 11/19/2020  ? Procedure: Left reverse shoulder arthroplasty;  Surgeon: Leim Fabry, MD;  Location: [facility] ORS;  Service: Orthopedics;  Laterality: Left;  ? ? ?Current Meds  ?Medication Sig  ? albuterol (VENTOLIN HFA) 108 (90 Base) MCG/ACT inhaler Inhale 2 puffs into the lungs every 6 (six) hours as needed for wheezing or shortness of breath.  ? aspirin 81 MG chewable tablet Chew 1 tablet (81 mg total) by mouth daily.  ? atorvastatin (LIPITOR) 40 MG tablet Take 1 tablet (40 mg total) by mouth daily.  ? carvedilol (COREG) 12.5 MG tablet Take 1 tablet (12.5 mg total) by mouth 2 (two) times daily.  ? Cholecalciferol (VITAMIN D-1000 MAX ST) 25 MCG (1000 UT) tablet Take 1,000 Units by mouth daily.   ? citalopram (CELEXA) 40 MG tablet Take 1.5 tablets (60 mg total) by mouth daily.  ? clopidogrel (PLAVIX) 75 MG tablet Take 75 mg by mouth daily.  ? ferrous sulfate 325 (65 FE) MG tablet Take 1 tablet (325 mg total) by mouth daily with breakfast.  ? furosemide (LASIX) 20 MG tablet Take 1 tablet (20 mg total) by mouth daily.  ? gentamicin cream (GARAMYCIN) 0.1 % Apply 1 application topically every other day.  ? levETIRAcetam (KEPPRA) 500 MG tablet Take 1 tablet (500 mg total) by mouth at bedtime.  ? meclizine (ANTIVERT) 12.5 MG tablet Take 1 tablet (12.5 mg total) by mouth 2 (two) times daily as needed for dizziness.  ? nitroGLYCERIN (NITROSTAT) 0.4 MG SL tablet Place 1 tablet (0.4 mg total) under the tongue every 5 (five) minutes as needed for chest pain.  ? nortriptyline (PAMELOR) 25 MG capsule Take 1 capsule (25 mg total) by mouth at bedtime.  ? ondansetron (ZOFRAN ODT) 4 MG disintegrating tablet Take 1 tablet (4 mg total) by mouth every 8 (eight) hours as needed for nausea or vomiting.   ? pantoprazole (PROTONIX) 20 MG tablet Take 20 mg by mouth daily.  ? polyethylene glycol (MIRALAX / GLYCOLAX) 17 g packet Take 17 g by mouth daily as needed.  ? predniSONE (DELTASONE) 10 MG tablet Take 10 mg by mouth daily with breakfast.  ? tamsulosin (FLOMAX) 0.4 MG CAPS capsule Take 2 capsules (0.8 mg total) by mouth daily.  ? ? ?Allergies: Patient has no known allergies. ? ?Social History  ? ?Tobacco Use  ? Smoking status: Former  ?  Packs/day: 0.50  ?  Years: 47.00  ?  Pack years: 23.50  ?  Types: Cigarettes  ?  Quit date: 09/11/2021  ?  Years since quitting: 0.2  ? Smokeless tobacco: Never  ? Tobacco comments:  ?  trying to cut back-smokes 6 to 7 cigs per day  ?Vaping Use  ? Vaping Use: Never used  ?Substance Use Topics  ? Alcohol use: Yes  ?  Comment: occasionally  ?  Drug use: No  ? ? ?Family History  ?Problem Relation Age of Onset  ? Stroke Mother   ?     Low BP stroke  ? Depression Father   ? Depression Sister   ?     Breast  ? Heart disease Other   ? Breast cancer Other   ? Lung cancer Other   ? Ovarian cancer Other   ? Stomach cancer Other   ? ? ?Review of Systems: ?A 12-system review of systems was performed and was negative except as noted in the HPI. ? ?-------------------------------------------------------------------------------------------------- ? ?Physical Exam: ?BP 106/68 (BP Location: Left Arm, Patient Position: Sitting, Cuff Size: Normal)   Pulse 86   Ht '5\' 7"'$  (1.702 m)   Wt 147 lb (66.7 kg)   SpO2 96%   BMI 23.02 kg/m?  ? ?General:  NAD. ?Neck: No JVD or HJR. ?Lungs: Mildly diminished breath sounds throughout without wheezes or crackles. ?Heart: Regular rate and rhythm with 2/6 systolic murmur. ?Abdomen: Soft, nontender, nondistended. ?Extremities: No lower extremity edema. ? ?EKG:  Normal sinus rhythm with borderline LVH and anterolateral ST/T changes.  No significant change since 11/18/2021. ? ?Lab Results  ?Component Value Date  ? WBC 4.8 11/25/2021  ? HGB 9.5 (L) 11/25/2021  ? HCT  29.8 (L) 11/25/2021  ? MCV 94.3 11/25/2021  ? PLT 173 11/25/2021  ? ? ?Lab Results  ?Component Value Date  ? NA 139 11/25/2021  ? K 4.3 11/25/2021  ? CL 105 11/25/2021  ? CO2 21 (L) 11/25/2021  ? BUN 57 (H) 03/

## 2021-12-11 NOTE — Telephone Encounter (Signed)
Pt's wife called stating she has not been called back regarding her husband's nebulizer request.  Pt's wife states she is extremely frustrated that her call has not been returned and would like call back today. Also states they are going out of town and she is worried patient will have an episode while they are away.    ? ?Please call. ?

## 2021-12-12 ENCOUNTER — Ambulatory Visit (INDEPENDENT_AMBULATORY_CARE_PROVIDER_SITE_OTHER): Payer: Medicare HMO | Admitting: Internal Medicine

## 2021-12-12 ENCOUNTER — Encounter: Payer: Self-pay | Admitting: Internal Medicine

## 2021-12-12 ENCOUNTER — Telehealth: Payer: Self-pay | Admitting: Family Medicine

## 2021-12-12 ENCOUNTER — Other Ambulatory Visit: Payer: Self-pay

## 2021-12-12 ENCOUNTER — Other Ambulatory Visit: Payer: Self-pay | Admitting: Family Medicine

## 2021-12-12 VITALS — BP 106/68 | HR 86 | Ht 67.0 in | Wt 147.0 lb

## 2021-12-12 DIAGNOSIS — D7581 Myelofibrosis: Secondary | ICD-10-CM

## 2021-12-12 DIAGNOSIS — I5022 Chronic systolic (congestive) heart failure: Secondary | ICD-10-CM | POA: Diagnosis not present

## 2021-12-12 DIAGNOSIS — N186 End stage renal disease: Secondary | ICD-10-CM | POA: Diagnosis not present

## 2021-12-12 DIAGNOSIS — Z992 Dependence on renal dialysis: Secondary | ICD-10-CM | POA: Diagnosis not present

## 2021-12-12 DIAGNOSIS — Z8673 Personal history of transient ischemic attack (TIA), and cerebral infarction without residual deficits: Secondary | ICD-10-CM

## 2021-12-12 DIAGNOSIS — I2510 Atherosclerotic heart disease of native coronary artery without angina pectoris: Secondary | ICD-10-CM

## 2021-12-12 DIAGNOSIS — E785 Hyperlipidemia, unspecified: Secondary | ICD-10-CM

## 2021-12-12 MED ORDER — ALBUTEROL SULFATE (2.5 MG/3ML) 0.083% IN NEBU: 2.5000 mg | INHALATION_SOLUTION | Freq: Four times a day (QID) | RESPIRATORY_TRACT | 1 refills | Status: AC | PRN

## 2021-12-12 NOTE — Patient Instructions (Signed)
Medication Instructions:  ? ?Your physician recommends that you continue on your current medications as directed. Please refer to the Current Medication list given to you today. ? ?*If you need a refill on your cardiac medications before your next appointment, please call your pharmacy* ? ? ?Lab Work: ? ?None ordered ? ?Testing/Procedures: ? ?Your physician has requested that you have an echocardiogram. Echocardiography is a painless test that uses sound waves to create images of your heart. It provides your doctor with information about the size and shape of your heart and how well your heart?s chambers and valves are working. This procedure takes approximately one hour. There are no restrictions for this procedure. ? ? ?Follow-Up: ?At [facility], you and your health needs are our priority.  As part of our continuing mission to provide you with exceptional heart care, we have created designated Provider Care Teams.  These Care Teams include your primary Cardiologist (physician) and Advanced Practice Providers (APPs -  Physician Assistants and Nurse Practitioners) who all work together to provide you with the care you need, when you need it. ? ?We recommend signing up for the patient portal called "MyChart".  Sign up information is provided on this After Visit Summary.  MyChart is used to connect with patients for Virtual Visits (Telemedicine).  Patients are able to view lab/test results, encounter notes, upcoming appointments, etc.  Non-urgent messages can be sent to your provider as well.   ?To learn more about what you can do with MyChart, go to [email or web address].   ? ?Your next appointment:   ?6 week(s) ? ?The format for your next appointment:   ?In Person ? ?Provider:   ?You may see Nelva Bush, MD or one of the following Advanced Practice Providers on your designated Care Team:   ?Murray Hodgkins, NP ?Christell Faith, PA-C ?Cadence Kathlen Mody, PA-C ?

## 2021-12-12 NOTE — Telephone Encounter (Signed)
Neb machine order is in folder to be signed by the provider. Dr. Wynetta Emery, can we send in the prescription for the actual medication to the pharmacy for the patient? We will then call the wife with the update.  ?

## 2021-12-12 NOTE — Telephone Encounter (Signed)
Medication Refill - Medication: Nebulizer machine medication ? ?Pt wife on the line upset that medication has not been sent in. Requesting for this to be sent in today and pt wife is requesting a call back.  ? ?Has the patient contacted their pharmacy? No. ?(Agent: If no, request that the patient contact the pharmacy for the refill. If patient does not wish to contact the pharmacy document the reason why and proceed with request.) ? ? ?Preferred Pharmacy (with phone number or street name):  ?[facility] [place], [place] - [address]  ?[address] [place] [address]  ?Phone: [phone] Fax: [phone]  ?Hours: Not open 24 hours  ? ?Has the patient been seen for an appointment in the last year OR does the patient have an upcoming appointment? Yes.   ? ?Agent: Please be advised that RX refills may take up to 3 business days. We ask that you follow-up with your pharmacy.  ?

## 2021-12-12 NOTE — Telephone Encounter (Signed)
Order faxed yesterday. ?

## 2021-12-13 DIAGNOSIS — N186 End stage renal disease: Secondary | ICD-10-CM | POA: Diagnosis not present

## 2021-12-13 DIAGNOSIS — Z992 Dependence on renal dialysis: Secondary | ICD-10-CM | POA: Diagnosis not present

## 2021-12-14 ENCOUNTER — Encounter: Payer: Self-pay | Admitting: Internal Medicine

## 2021-12-15 DIAGNOSIS — N186 End stage renal disease: Secondary | ICD-10-CM | POA: Diagnosis not present

## 2021-12-15 DIAGNOSIS — Z992 Dependence on renal dialysis: Secondary | ICD-10-CM | POA: Diagnosis not present

## 2021-12-16 DIAGNOSIS — Z992 Dependence on renal dialysis: Secondary | ICD-10-CM | POA: Diagnosis not present

## 2021-12-16 DIAGNOSIS — N186 End stage renal disease: Secondary | ICD-10-CM | POA: Diagnosis not present

## 2021-12-17 DIAGNOSIS — N186 End stage renal disease: Secondary | ICD-10-CM | POA: Diagnosis not present

## 2021-12-17 DIAGNOSIS — Z992 Dependence on renal dialysis: Secondary | ICD-10-CM | POA: Diagnosis not present

## 2021-12-18 DIAGNOSIS — Z992 Dependence on renal dialysis: Secondary | ICD-10-CM | POA: Diagnosis not present

## 2021-12-18 DIAGNOSIS — N186 End stage renal disease: Secondary | ICD-10-CM | POA: Diagnosis not present

## 2021-12-19 DIAGNOSIS — N186 End stage renal disease: Secondary | ICD-10-CM | POA: Diagnosis not present

## 2021-12-19 DIAGNOSIS — Z992 Dependence on renal dialysis: Secondary | ICD-10-CM | POA: Diagnosis not present

## 2021-12-20 DIAGNOSIS — N186 End stage renal disease: Secondary | ICD-10-CM | POA: Diagnosis not present

## 2021-12-20 DIAGNOSIS — Z992 Dependence on renal dialysis: Secondary | ICD-10-CM | POA: Diagnosis not present

## 2021-12-22 ENCOUNTER — Other Ambulatory Visit: Payer: Self-pay

## 2021-12-22 ENCOUNTER — Encounter: Payer: Self-pay | Admitting: Emergency Medicine

## 2021-12-22 ENCOUNTER — Observation Stay
Admission: EM | Admit: 2021-12-22 | Discharge: 2021-12-24 | Disposition: A | Discharge status: Home / Self Care | Payer: Medicare HMO | Attending: Hospitalist | Admitting: Hospitalist

## 2021-12-22 ENCOUNTER — Emergency Department: Payer: Medicare HMO

## 2021-12-22 DIAGNOSIS — D631 Anemia in chronic kidney disease: Secondary | ICD-10-CM | POA: Insufficient documentation

## 2021-12-22 DIAGNOSIS — R079 Chest pain, unspecified: Secondary | ICD-10-CM | POA: Diagnosis not present

## 2021-12-22 DIAGNOSIS — Z955 Presence of coronary angioplasty implant and graft: Secondary | ICD-10-CM | POA: Insufficient documentation

## 2021-12-22 DIAGNOSIS — Z7982 Long term (current) use of aspirin: Secondary | ICD-10-CM | POA: Diagnosis not present

## 2021-12-22 DIAGNOSIS — I3139 Other pericardial effusion (noninflammatory): Secondary | ICD-10-CM | POA: Diagnosis not present

## 2021-12-22 DIAGNOSIS — D7581 Myelofibrosis: Secondary | ICD-10-CM | POA: Diagnosis not present

## 2021-12-22 DIAGNOSIS — Z992 Dependence on renal dialysis: Secondary | ICD-10-CM | POA: Insufficient documentation

## 2021-12-22 DIAGNOSIS — Z8673 Personal history of transient ischemic attack (TIA), and cerebral infarction without residual deficits: Secondary | ICD-10-CM | POA: Insufficient documentation

## 2021-12-22 DIAGNOSIS — D638 Anemia in other chronic diseases classified elsewhere: Secondary | ICD-10-CM | POA: Diagnosis present

## 2021-12-22 DIAGNOSIS — Z87898 Personal history of other specified conditions: Secondary | ICD-10-CM

## 2021-12-22 DIAGNOSIS — Z79899 Other long term (current) drug therapy: Secondary | ICD-10-CM | POA: Insufficient documentation

## 2021-12-22 DIAGNOSIS — R0689 Other abnormalities of breathing: Secondary | ICD-10-CM | POA: Diagnosis not present

## 2021-12-22 DIAGNOSIS — I700 Atherosclerosis of aorta: Secondary | ICD-10-CM | POA: Diagnosis not present

## 2021-12-22 DIAGNOSIS — I2510 Atherosclerotic heart disease of native coronary artery without angina pectoris: Secondary | ICD-10-CM | POA: Diagnosis not present

## 2021-12-22 DIAGNOSIS — I110 Hypertensive heart disease with heart failure: Secondary | ICD-10-CM | POA: Diagnosis not present

## 2021-12-22 DIAGNOSIS — Z96612 Presence of left artificial shoulder joint: Secondary | ICD-10-CM | POA: Insufficient documentation

## 2021-12-22 DIAGNOSIS — Z87891 Personal history of nicotine dependence: Secondary | ICD-10-CM | POA: Diagnosis not present

## 2021-12-22 DIAGNOSIS — F32A Depression, unspecified: Secondary | ICD-10-CM | POA: Diagnosis present

## 2021-12-22 DIAGNOSIS — I132 Hypertensive heart and chronic kidney disease with heart failure and with stage 5 chronic kidney disease, or end stage renal disease: Secondary | ICD-10-CM | POA: Insufficient documentation

## 2021-12-22 DIAGNOSIS — E875 Hyperkalemia: Secondary | ICD-10-CM | POA: Diagnosis not present

## 2021-12-22 DIAGNOSIS — I509 Heart failure, unspecified: Secondary | ICD-10-CM | POA: Diagnosis not present

## 2021-12-22 DIAGNOSIS — J9811 Atelectasis: Secondary | ICD-10-CM | POA: Diagnosis not present

## 2021-12-22 DIAGNOSIS — N186 End stage renal disease: Secondary | ICD-10-CM

## 2021-12-22 DIAGNOSIS — J449 Chronic obstructive pulmonary disease, unspecified: Secondary | ICD-10-CM | POA: Diagnosis not present

## 2021-12-22 DIAGNOSIS — R062 Wheezing: Secondary | ICD-10-CM | POA: Diagnosis not present

## 2021-12-22 DIAGNOSIS — Z7902 Long term (current) use of antithrombotics/antiplatelets: Secondary | ICD-10-CM | POA: Insufficient documentation

## 2021-12-22 DIAGNOSIS — Z20822 Contact with and (suspected) exposure to covid-19: Secondary | ICD-10-CM | POA: Insufficient documentation

## 2021-12-22 DIAGNOSIS — R0602 Shortness of breath: Secondary | ICD-10-CM | POA: Diagnosis not present

## 2021-12-22 DIAGNOSIS — R569 Unspecified convulsions: Secondary | ICD-10-CM

## 2021-12-22 DIAGNOSIS — I5043 Acute on chronic combined systolic (congestive) and diastolic (congestive) heart failure: Secondary | ICD-10-CM | POA: Diagnosis not present

## 2021-12-22 DIAGNOSIS — J90 Pleural effusion, not elsewhere classified: Secondary | ICD-10-CM | POA: Diagnosis not present

## 2021-12-22 DIAGNOSIS — I10 Essential (primary) hypertension: Secondary | ICD-10-CM | POA: Diagnosis present

## 2021-12-22 DIAGNOSIS — R0600 Dyspnea, unspecified: Secondary | ICD-10-CM | POA: Diagnosis present

## 2021-12-22 DIAGNOSIS — I129 Hypertensive chronic kidney disease with stage 1 through stage 4 chronic kidney disease, or unspecified chronic kidney disease: Secondary | ICD-10-CM

## 2021-12-22 DIAGNOSIS — J811 Chronic pulmonary edema: Secondary | ICD-10-CM | POA: Diagnosis not present

## 2021-12-22 LAB — COMPREHENSIVE METABOLIC PANEL
ALT: 12 U/L (ref 0–44)
AST: 15 U/L (ref 15–41)
Albumin: 3.3 g/dL — ABNORMAL LOW (ref 3.5–5.0)
Alkaline Phosphatase: 56 U/L (ref 38–126)
Anion gap: 13 (ref 5–15)
BUN: 65 mg/dL — ABNORMAL HIGH (ref 8–23)
CO2: 22 mmol/L (ref 22–32)
Calcium: 7.6 mg/dL — ABNORMAL LOW (ref 8.9–10.3)
Chloride: 105 mmol/L (ref 98–111)
Creatinine, Ser: 7.58 mg/dL — ABNORMAL HIGH (ref 0.61–1.24)
GFR, Estimated: 7 mL/min — ABNORMAL LOW (ref >60)
Glucose, Bld: 99 mg/dL (ref 70–99)
Potassium: 5.3 mmol/L — ABNORMAL HIGH (ref 3.5–5.1)
Sodium: 140 mmol/L (ref 135–145)
Total Bilirubin: 0.8 mg/dL (ref 0.3–1.2)
Total Protein: 6.1 g/dL — ABNORMAL LOW (ref 6.5–8.1)

## 2021-12-22 LAB — CBC WITH DIFFERENTIAL/PLATELET
Abs Immature Granulocytes: 0.04 10*3/uL (ref 0.00–0.07)
Basophils Absolute: 0 10*3/uL (ref 0.0–0.1)
Basophils Relative: 0 %
Eosinophils Absolute: 0 10*3/uL (ref 0.0–0.5)
Eosinophils Relative: 0 %
HCT: 28.5 % — ABNORMAL LOW (ref 39.0–52.0)
Hemoglobin: 8.6 g/dL — ABNORMAL LOW (ref 13.0–17.0)
Immature Granulocytes: 1 %
Lymphocytes Relative: 7 %
Lymphs Abs: 0.3 10*3/uL — ABNORMAL LOW (ref 0.7–4.0)
MCH: 28.7 pg (ref 26.0–34.0)
MCHC: 30.2 g/dL (ref 30.0–36.0)
MCV: 95 fL (ref 80.0–100.0)
Monocytes Absolute: 0.2 10*3/uL (ref 0.1–1.0)
Monocytes Relative: 4 %
Neutro Abs: 3.9 10*3/uL (ref 1.7–7.7)
Neutrophils Relative %: 88 %
Platelets: 139 10*3/uL — ABNORMAL LOW (ref 150–400)
RBC: 3 MIL/uL — ABNORMAL LOW (ref 4.22–5.81)
RDW: 16.1 % — ABNORMAL HIGH (ref 11.5–15.5)
WBC: 4.5 10*3/uL (ref 4.0–10.5)
nRBC: 0 % (ref 0.0–0.2)

## 2021-12-22 LAB — BRAIN NATRIURETIC PEPTIDE: B Natriuretic Peptide: 1548.3 pg/mL — ABNORMAL HIGH (ref 0.0–100.0)

## 2021-12-22 LAB — TROPONIN I (HIGH SENSITIVITY)
Troponin I (High Sensitivity): 46 ng/L — ABNORMAL HIGH (ref <18)
Troponin I (High Sensitivity): 48 ng/L — ABNORMAL HIGH (ref <18)
Troponin I (High Sensitivity): 44 ng/L — ABNORMAL HIGH (ref <18)
Troponin I (High Sensitivity): 45 ng/L — ABNORMAL HIGH (ref <18)

## 2021-12-22 LAB — RESP PANEL BY RT-PCR (FLU A&B, COVID) ARPGX2
Influenza A by PCR: NEGATIVE
Influenza B by PCR: NEGATIVE
SARS Coronavirus 2 by RT PCR: NEGATIVE

## 2021-12-22 MED ORDER — MECLIZINE HCL 25 MG PO TABS
12.5000 mg | ORAL_TABLET | Freq: Two times a day (BID) | ORAL | Status: DC | PRN
  Filled 2021-12-22: qty 0.5

## 2021-12-22 MED ORDER — ALBUTEROL SULFATE (2.5 MG/3ML) 0.083% IN NEBU
2.5000 mg | INHALATION_SOLUTION | Freq: Four times a day (QID) | RESPIRATORY_TRACT | Status: DC | PRN
  Administered 2021-12-22: 2.5 mg via RESPIRATORY_TRACT
  Filled 2021-12-22: qty 3

## 2021-12-22 MED ORDER — ASPIRIN 81 MG PO CHEW
324.0000 mg | CHEWABLE_TABLET | Freq: Once | ORAL | Status: DC
  Filled 2021-12-22 (×2): qty 4

## 2021-12-22 MED ORDER — POLYETHYLENE GLYCOL 3350 17 G PO PACK: 17.0000 g | PACK | Freq: Every day | ORAL | Status: DC | PRN

## 2021-12-22 MED ORDER — GENTAMICIN SULFATE 0.1 % EX CREA
1.0000 "application " | TOPICAL_CREAM | Freq: Every day | CUTANEOUS | Status: DC
  Administered 2021-12-22: 1 via TOPICAL
  Filled 2021-12-22 (×2): qty 15

## 2021-12-22 MED ORDER — ACETAMINOPHEN 650 MG RE SUPP: 650.0000 mg | Freq: Four times a day (QID) | RECTAL | Status: DC | PRN

## 2021-12-22 MED ORDER — HEPARIN SODIUM (PORCINE) 5000 UNIT/ML IJ SOLN
5000.0000 [IU] | Freq: Three times a day (TID) | INTRAMUSCULAR | Status: DC
  Administered 2021-12-22 – 2021-12-24 (×5): 5000 [IU] via SUBCUTANEOUS
  Filled 2021-12-22 (×5): qty 1

## 2021-12-22 MED ORDER — FUROSEMIDE 10 MG/ML IJ SOLN
80.0000 mg | Freq: Two times a day (BID) | INTRAMUSCULAR | Status: DC
  Administered 2021-12-23: 80 mg via INTRAVENOUS
  Filled 2021-12-22: qty 8

## 2021-12-22 MED ORDER — FERROUS SULFATE 325 (65 FE) MG PO TABS
325.0000 mg | ORAL_TABLET | Freq: Every day | ORAL | Status: DC
  Administered 2021-12-23 – 2021-12-24 (×2): 325 mg via ORAL
  Filled 2021-12-22 (×2): qty 1

## 2021-12-22 MED ORDER — ACETAMINOPHEN 325 MG PO TABS: 650.0000 mg | ORAL_TABLET | Freq: Four times a day (QID) | ORAL | Status: DC | PRN

## 2021-12-22 MED ORDER — ONDANSETRON HCL 4 MG/2ML IJ SOLN
4.0000 mg | Freq: Four times a day (QID) | INTRAMUSCULAR | Status: DC | PRN
Start: 2021-12-22 — End: 2021-12-24

## 2021-12-22 MED ORDER — CLOPIDOGREL BISULFATE 75 MG PO TABS
75.0000 mg | ORAL_TABLET | Freq: Every day | ORAL | Status: DC
  Administered 2021-12-23 – 2021-12-24 (×2): 75 mg via ORAL
  Filled 2021-12-22 (×3): qty 1

## 2021-12-22 MED ORDER — NITROGLYCERIN 0.4 MG SL SUBL: 0.4000 mg | SUBLINGUAL_TABLET | SUBLINGUAL | Status: DC | PRN

## 2021-12-22 MED ORDER — ONDANSETRON HCL 4 MG PO TABS: 4.0000 mg | ORAL_TABLET | Freq: Four times a day (QID) | ORAL | Status: DC | PRN

## 2021-12-22 MED ORDER — LEVETIRACETAM 500 MG PO TABS
500.0000 mg | ORAL_TABLET | Freq: Every day | ORAL | Status: DC
  Administered 2021-12-22 – 2021-12-23 (×2): 500 mg via ORAL
  Filled 2021-12-22 (×2): qty 1

## 2021-12-22 MED ORDER — PREDNISONE 10 MG PO TABS
10.0000 mg | ORAL_TABLET | Freq: Every day | ORAL | Status: DC
  Administered 2021-12-23 – 2021-12-24 (×2): 10 mg via ORAL
  Filled 2021-12-22 (×2): qty 1

## 2021-12-22 MED ORDER — VITAMIN D 25 MCG (1000 UNIT) PO TABS
1000.0000 [IU] | ORAL_TABLET | Freq: Every day | ORAL | Status: DC
  Administered 2021-12-23 – 2021-12-24 (×2): 1000 [IU] via ORAL
  Filled 2021-12-22 (×3): qty 1

## 2021-12-22 MED ORDER — ATORVASTATIN CALCIUM 20 MG PO TABS
40.0000 mg | ORAL_TABLET | Freq: Every day | ORAL | Status: DC
  Administered 2021-12-23 – 2021-12-24 (×2): 40 mg via ORAL
  Filled 2021-12-22 (×3): qty 2

## 2021-12-22 MED ORDER — PANTOPRAZOLE SODIUM 20 MG PO TBEC
20.0000 mg | DELAYED_RELEASE_TABLET | Freq: Every day | ORAL | Status: DC
  Administered 2021-12-22 – 2021-12-24 (×3): 20 mg via ORAL
  Filled 2021-12-22 (×4): qty 1

## 2021-12-22 MED ORDER — ACETAMINOPHEN 500 MG PO TABS: 500.0000 mg | ORAL_TABLET | Freq: Four times a day (QID) | ORAL | Status: DC | PRN

## 2021-12-22 MED ORDER — FUROSEMIDE 10 MG/ML IJ SOLN
40.0000 mg | Freq: Once | INTRAMUSCULAR | Status: AC
  Administered 2021-12-22: 40 mg via INTRAVENOUS
  Filled 2021-12-22: qty 4

## 2021-12-22 MED ORDER — FUROSEMIDE 10 MG/ML IJ SOLN: 80.0000 mg | Freq: Every day | INTRAMUSCULAR | Status: DC

## 2021-12-22 MED ORDER — IPRATROPIUM-ALBUTEROL 0.5-2.5 (3) MG/3ML IN SOLN
3.0000 mL | Freq: Once | RESPIRATORY_TRACT | Status: AC
  Administered 2021-12-22: 3 mL via RESPIRATORY_TRACT
  Filled 2021-12-22: qty 3

## 2021-12-22 MED ORDER — DELFLEX-LC/2.5% DEXTROSE 394 MOSM/L IP SOLN
INTRAPERITONEAL | Status: DC
  Filled 2021-12-22 (×3): qty 3000

## 2021-12-22 MED ORDER — ASPIRIN 81 MG PO CHEW
81.0000 mg | CHEWABLE_TABLET | Freq: Every day | ORAL | Status: DC
  Administered 2021-12-23 – 2021-12-24 (×2): 81 mg via ORAL
  Filled 2021-12-22 (×2): qty 1

## 2021-12-22 MED ORDER — TAMSULOSIN HCL 0.4 MG PO CAPS
0.8000 mg | ORAL_CAPSULE | Freq: Every day | ORAL | Status: DC
  Administered 2021-12-23 – 2021-12-24 (×2): 0.8 mg via ORAL
  Filled 2021-12-22 (×3): qty 2

## 2021-12-22 MED ORDER — NORTRIPTYLINE HCL 25 MG PO CAPS
25.0000 mg | ORAL_CAPSULE | Freq: Every day | ORAL | Status: DC
  Administered 2021-12-22 – 2021-12-23 (×2): 25 mg via ORAL
  Filled 2021-12-22 (×2): qty 1

## 2021-12-22 MED ORDER — CARVEDILOL 12.5 MG PO TABS
12.5000 mg | ORAL_TABLET | Freq: Two times a day (BID) | ORAL | Status: DC
Start: 2021-12-22 — End: 2021-12-24
  Administered 2021-12-22 – 2021-12-24 (×4): 12.5 mg via ORAL
  Filled 2021-12-22 (×4): qty 1

## 2021-12-22 MED ORDER — CITALOPRAM HYDROBROMIDE 20 MG PO TABS
60.0000 mg | ORAL_TABLET | Freq: Every day | ORAL | Status: DC
  Administered 2021-12-23 – 2021-12-24 (×2): 60 mg via ORAL
  Filled 2021-12-22 (×3): qty 3

## 2021-12-22 NOTE — Assessment & Plan Note (Signed)
H&H is stable ?Monitor closely ?

## 2021-12-22 NOTE — Assessment & Plan Note (Addendum)
--  iPD per nephro ?

## 2021-12-22 NOTE — ED Provider Notes (Signed)
? ?[facility] ?Provider Note ? ? ? Event Date/Time  ? First MD Initiated Contact with Patient 12/22/21 1146   ?  (approximate) ? ? ?History  ? ?Respiratory Distress ? ? ?HPI ? ?James Arnold is a 66 y.o. male with past medical history of coronary disease, chronic heart failure with reduced ejection fraction, CKD on peritoneal dialysis, gout, COPD who presents with shortness of breath.  Patient woke up around 3 AM to go to the bathroom and had acute onset of dyspnea.  Also endorses substernal chest pressure worse with breathing.  He gave himself nebs at home which helped a little bit and received both Solu-Medrol and DuoNebs and albuterol with EMS.  Feeling improved by the time my evaluation.  Still having ongoing chest pressure but again improved.  Has had similar pain in the past in the setting of COPD exacerbation.  Denies cough fevers chills.  No nausea vomiting abdominal pain.   ?  ? ?Past Medical History:  ?Diagnosis Date  ? Anemia   ? Benign hypertensive renal disease   ? CAD (coronary artery disease)   ? a. 08/1996 s/p BMS to the mLAD ([facility]); b. 12/2017 MV: EF 46%, --> low risk; c. 07/2020 MV: EF 51%, no ischemia/infarct; d. 08/2021 Cath/PCI: LM nl, LAD 60p, 95p/m ISR (3.0x48 Synergy DES), RI small, nl, LCX 40p, 95d(3.5x16 Synergy DES), OM1 50, RCA 40p/184m  ? Carotid arterial disease ([facility]St. [facility]   ? a. 11/2020 Carotid U/S: <50% bilat ICA stenoses.  ? Chronic HFrEF (heart failure with reduced ejection fraction) (HPione[place]   ? a. 08/2022 Echo: EF 35-40%.  ? CKD (chronic kidney disease), stage V (HCroswell   ? on peritoneal dialysis  ? Complication of anesthesia   ? please call by "[name]" when waking up!!  ? COPD (chronic obstructive pulmonary disease) (HManchester   ? centrilobular emphysema. pt is poorly controlled with his copd/smoking.  ? Depression   ? Diastolic dysfunction   ? a. 12/2017 Echo: EF 60-65%, no rwma, Gr1 DD, mild AI/MR. Nl RVSP; b. 08/2020 Echo: EF 60-65%, no rwma, GrI DD, nl RV fxn. RVSP  32.552mg.  ? Dyspnea on exertion   ? with exertion  ? Fatigue   ? FSGS (focal segmental glomerulosclerosis)   ? Hyperlipidemia 10/2002  ? Hypertension   ? Ischemic cardiomyopathy   ? a. 08/2022 Echo: EF 35-40%, glob HK, GrII DD, nl RV fxn, mildly dil LA, mild MR, mild AS.  ? myelofibrosis   ? bone marrow failure  ? Myelofibrosis (HCJonestown2010  ? JAK2 (+) primary  ? [facility]  ? stent x 1  ? Pneumoperitoneum 02/2021  ? PSVT (paroxysmal supraventricular tachycardia) (HCMendeltna  ? a. 12/2020 Zio: Sinus rhythm 80 (55-117). Rare PACs/PVCs. 17 episodes of SVT (longest 20.3 secs, fastest 184). No triggered events. No afib.  ? Smoker   ? [facility] ([facility]  ? a. 11/2020 MRI Brain: patchy acute/early subacute cortical infarcts w/in the R frontal, parietal, and occiptal lobes. Small, chronic L basal ganglia lacunar infarct.  ? Thrombocytopenia (HCPanama  ? ? ?Patient Active Problem List  ? Diagnosis Date Noted  ? Chronic HFrEF (heart failure with reduced ejection fraction) (HCDelshire01/19/2023  ? Chronic anemia 10/10/2021  ? COPD (chronic obstructive pulmonary disease) (HCOxford12/23/2022  ? NSTEMI (non-ST elevated myocardial infarction) (HCBurlington12/21/2022  ? Aggression 09/06/2021  ? Migraine without aura and without status migrainosus, not intractable 09/06/2021  ? History of stroke 03/14/2021  ? Aortic  atherosclerosis ([place]) 12/12/2020  ? Paralytic syndrome, non-dominant side, post-stroke ([place]) 12/10/2020  ? Anemia due to vitamin B12 deficiency   ? Acute CVA (cerebrovascular accident) ([place])   ? Anemia of chronic disease   ? Urinary retention   ? Focal seizure ([place]) 11/20/2020  ? Thrombocytopenia ([place]) 11/20/2020  ? ESRD (end stage renal disease) ([place]) 11/20/2020  ? Hyperkalemia 11/20/2020  ? Leukocytosis 11/20/2020  ? ESRD on peritoneal dialysis ([place]) 11/20/2020  ? Status post shoulder replacement   ? Rotator cuff arthropathy 11/19/2020  ? Tobacco abuse 11/07/2020  ? Hypertensive heart and chronic kidney disease with heart  failure and with stage 5 chronic kidney disease, or end stage renal disease ([place]) 07/10/2020  ? Dialysis patient [facility]) 01/09/2020  ? Senile purpura ([place]) 01/09/2020  ? Centrilobular emphysema ([place]) 09/13/2019  ? Secondary hyperparathyroidism ([place]) 03/14/2019  ? BPH (benign prostatic hyperplasia) 10/24/2018  ? Depression 10/24/2018  ? 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  ? Anemia of chronic kidney failure, stage 5 ([place]) 12/30/2017  ? Proteinuria 11/25/2017  ? Gout 08/27/2017  ? Myelofibrosis ([facility])   ? Major depression, recurrent ([place])   ? Benign hypertensive renal disease   ? CKD (chronic kidney disease) stage 5, GFR less than 15 ml/min (HCC)   ? Hyperlipidemia LDL goal <70 10/23/2002  ? CAD (coronary artery disease) 08/22/1996  ? ? ? ?Physical Exam  ?Triage Vital Signs: ?ED Triage Vitals  ?Enc Vitals Group  ?   BP 12/22/21 1149 129/73  ?   Pulse Rate 12/22/21 1149 (!) 101  ?   Resp 12/22/21 1149 18  ?   Temp 12/22/21 1149 97.9 ?F (36.6 ?C)  ?   Temp Source 12/22/21 1149 Oral  ?   SpO2 12/22/21 1146 91 %  ?   Weight 12/22/21 1152 148 lb (67.1 kg)  ?   Height 12/22/21 1152 '5\' 7"'$  (1.702 m)  ?   Head Circumference --   ?   Peak Flow --   ?   Pain Score 12/22/21 1149 4  ?   Pain Loc --   ?   Pain Edu? --   ?   Excl. in [place]? --   ? ? ?Most recent vital signs: ?Vitals:  ? 12/22/21 1300 12/22/21 1400  ?BP: 129/72 (!) 143/83  ?Pulse: 98 (!) 104  ?Resp: 20 (!) 21  ?Temp:    ?SpO2: 93% 91%  ? ? ? ?General: Awake, no distress.  ?CV:  Good peripheral perfusion. No edema ?Resp:  Normal effort. No increased WOB, no wheezing ?Abd:  No distention. soft ?Neuro:             Awake, Alert, Oriented x 3  ?Other:   ? ? ?ED Results / Procedures / Treatments  ?Labs ?(all labs ordered are listed, but only abnormal results are displayed) ?Labs Reviewed  ?CBC WITH DIFFERENTIAL/PLATELET - Abnormal; Notable for the following components:  ?    Result Value  ? RBC 3.00 (*)   ? Hemoglobin 8.6 (*)   ? HCT 28.5 (*)   ? RDW 16.1 (*)    ? Platelets 139 (*)   ? Lymphs Abs 0.3 (*)   ? All other components within normal limits  ?COMPREHENSIVE METABOLIC PANEL - Abnormal; Notable for the following components:  ? Potassium 5.3 (*)   ? BUN 65 (*)   ? Creatinine, Ser 7.58 (*)   ? Calcium 7.6 (*)   ? Total Protein 6.1 (*)   ? Albumin 3.3 (*)   ?  GFR, Estimated 7 (*)   ? All other components within normal limits  ?BRAIN NATRIURETIC PEPTIDE - Abnormal; Notable for the following components:  ? B Natriuretic Peptide 1,548.3 (*)   ? All other components within normal limits  ?TROPONIN I (HIGH SENSITIVITY) - Abnormal; Notable for the following components:  ? Troponin I (High Sensitivity) 46 (*)   ? All other components within normal limits  ?TROPONIN I (HIGH SENSITIVITY) - Abnormal; Notable for the following components:  ? Troponin I (High Sensitivity) 48 (*)   ? All other components within normal limits  ?RESP PANEL BY RT-PCR (FLU A&B, COVID) ARPGX2  ? ? ? ?EKG ? ?EKG interpreted by myself, normal sinus rhythm normal axis normal intervals, LVH with T wave inversions in 1 and aVL and ST depression V4 through V6 consistent with repolarization abnormality from LVH ? ? ?RADIOLOGY ?Reviewed patient's chest x-ray which shows pulmonary edema ? ? ?PROCEDURES: ? ?Critical Care performed: No ? ?.1-3 Lead EKG Interpretation ?Performed by: Rada Hay, MD ?Authorized by: Rada Hay, MD  ? ?  Interpretation: abnormal   ?  ECG rate assessment: tachycardic   ?  Rhythm: sinus tachycardia   ?  Ectopy: none   ?  Conduction: normal   ? ?The patient is on the cardiac monitor to evaluate for evidence of arrhythmia and/or significant heart rate changes. ? ? ?MEDICATIONS ORDERED IN ED: ?Medications  ?aspirin chewable tablet 324 mg (324 mg Oral Not Given 12/22/21 1340)  ?ipratropium-albuterol (DUONEB) 0.5-2.5 (3) MG/3ML nebulizer solution 3 mL (3 mLs Nebulization Given 12/22/21 1224)  ?furosemide (LASIX) injection 40 mg (40 mg Intravenous Given 12/22/21 1333)   ? ? ? ?IMPRESSION / MDM / ASSESSMENT AND PLAN / ED COURSE  ?I reviewed the triage vital signs and the nursing notes. ?             ?               ? ?Differential diagnosis includes, but is not limited to, COPD exacerbation

## 2021-12-22 NOTE — ED Triage Notes (Signed)
[facility] from [facility] for resp distress. Patient states that he did multiple breathing treatments at home with no relief. Patient does peritoneal dialysis at home. Patient given 125 solu-medrol, dueo neb and albuterol neb by EMS. Patient AOX4.  ?

## 2021-12-22 NOTE — Assessment & Plan Note (Addendum)
Blood pressure is stable ?Continue carvedilol ?--cont IV lasix ?

## 2021-12-22 NOTE — Assessment & Plan Note (Signed)
Most likely related to known history of end-stage renal disease ?Expect improvement following renal replacement therapy ?

## 2021-12-22 NOTE — H&P (Signed)
?History and Physical  ? ? ?Patient: James Arnold ZOX:[id number] DOB: 28-Oct-1955 ?DOA: 12/22/2021 ?DOS: the patient was seen and examined on 12/22/2021 ?PCP: Valerie Roys, DO  ?Patient coming from: Home ? ?Chief Complaint:  ?Chief Complaint  ?Patient presents with  ? Respiratory Distress  ? ?HPI: EPHRIAM TURMAN is a 66 y.o. male with medical history significant for end-stage renal disease on peritoneal dialysis, coronary artery disease status post recent stent angioplasty, COPD, hypertension, myelofibrosis who presents to the ER via EMS for evaluation of sudden onset shortness of breath. ?Patient stated he woke up around 3 AM to use the bathroom and developed sudden onset dyspnea associated with substernal chest pressure. ?He denied having any nausea, no vomiting, no diaphoresis or palpitations. ?He used his nebulizer at home without any significant improvement and called EMS who administered a dose of Solu-Medrol and DuoNeb and he was transported to the ER. ?During my evaluation he continues to have chest pain but states that it is improved and rates it a 3 x 10 in intensity at its worst.  Pain is non radiating. ?He denies having any cough, no fever, no chills, no abdominal pain, no changes in his bowel habits, no urinary symptoms, no blurred vision, no focal deficits, no leg swelling. ?Patient had a chest x-ray which showed interval worsening of pulmonary vascular congestion suggesting CHF. Interval increase in interstitial markings in both ?lungs suggest pulmonary edema. Small bilateral pleural effusions. ?He still makes urine and received a dose of Lasix 40 mg IV ?Review of Systems: As mentioned in the history of present illness. All other systems reviewed and are negative. ?Past Medical History:  ?Diagnosis Date  ? Anemia   ? Benign hypertensive renal disease   ? CAD (coronary artery disease)   ? a. 08/1996 s/p BMS to the mLAD ([facility]); b. 12/2017 MV: EF 46%, --> low risk; c. 07/2020 MV: EF 51%, no ischemia/infarct;  d. 08/2021 Cath/PCI: LM nl, LAD 60p, 95p/m ISR (3.0x48 Synergy DES), RI small, nl, LCX 40p, 95d(3.5x16 Synergy DES), OM1 50, RCA 40p/189m  ? Carotid arterial disease ([place]   ? a. 11/2020 Carotid U/S: <50% bilat ICA stenoses.  ? Chronic HFrEF (heart failure with reduced ejection fraction) ([place]   ? a. 08/2022 Echo: EF 35-40%.  ? CKD (chronic kidney disease), stage V ([place]   ? on peritoneal dialysis  ? Complication of anesthesia   ? please call by "[name]" when waking up!!  ? COPD (chronic obstructive pulmonary disease) ([place]   ? centrilobular emphysema. pt is poorly controlled with his copd/smoking.  ? Depression   ? Diastolic dysfunction   ? a. 12/2017 Echo: EF 60-65%, no rwma, Gr1 DD, mild AI/MR. Nl RVSP; b. 08/2020 Echo: EF 60-65%, no rwma, GrI DD, nl RV fxn. RVSP 32.532mg.  ? Dyspnea on exertion   ? with exertion  ? Fatigue   ? FSGS (focal segmental glomerulosclerosis)   ? Hyperlipidemia 10/2002  ? Hypertension   ? Ischemic cardiomyopathy   ? a. 08/2022 Echo: EF 35-40%, glob HK, GrII DD, nl RV fxn, mildly dil LA, mild MR, mild AS.  ? myelofibrosis   ? bone marrow failure  ? Myelofibrosis ([facility]  ? JAK2 (+) primary  ? [facility]  ? stent x 1  ? Pneumoperitoneum 02/2021  ? PSVT (paroxysmal supraventricular tachycardia) ([facility]  ? a. 12/2020 Zio: Sinus rhythm 80 (55-117). Rare PACs/PVCs. 17 episodes of SVT (longest 20.3 secs, fastest 184). No triggered events. No afib.  ?  Smoker   ? Stroke [facility])   ? a. 11/2020 MRI Brain: patchy acute/early subacute cortical infarcts w/in the R frontal, parietal, and occiptal lobes. Small, chronic L basal ganglia lacunar infarct.  ? Thrombocytopenia ([place])   ? ?Past Surgical History:  ?Procedure Laterality Date  ? ANGIOPLASTY    ? BONE MARROW ASPIRATION  03/2009  ? CARDIAC CATHETERIZATION  1997  ? stent x 1  ? COLONOSCOPY WITH PROPOFOL N/A 08/09/2018  ? Procedure: COLONOSCOPY WITH PROPOFOL;  Surgeon: [name], MD;  Location: [facility] ENDOSCOPY;  Service:  Gastroenterology;  Laterality: N/A;  ? CORONARY ARTERY BYPASS GRAFT    ? CORONARY STENT PLACEMENT  08/1996  ? ESOPHAGOGASTRODUODENOSCOPY (EGD) WITH PROPOFOL N/A 07/25/2021  ? Procedure: ESOPHAGOGASTRODUODENOSCOPY (EGD) WITH PROPOFOL;  Surgeon: [name], MD;  Location: [facility] ENDOSCOPY;  Service: Gastroenterology;  Laterality: N/A;  ? EXTERIORIZATION OF A CONTINUOUS AMBULATORY PERITONEAL DIALYSIS CATHETER    ? EYE SURGERY Bilateral   ? cats removed  ? LEFT HEART CATH AND CORONARY ANGIOGRAPHY N/A 09/12/2021  ? Procedure: LEFT HEART CATH AND CORONARY ANGIOGRAPHY;  Surgeon: Wellington Hampshire, MD;  Location: [facility] CV LAB;  Service: Cardiovascular;  Laterality: N/A;  ? REVERSE SHOULDER ARTHROPLASTY Left 11/19/2020  ? Procedure: Left reverse shoulder arthroplasty;  Surgeon: Leim Fabry, MD;  Location: [facility] ORS;  Service: Orthopedics;  Laterality: Left;  ? ?Social History:  reports that he quit smoking about 3 months ago. His smoking use included cigarettes. He has a 23.50 pack-year smoking history. He has never used smokeless tobacco. He reports current alcohol use. He reports that he does not use drugs. ? ?No Known Allergies ? ?Family History  ?Problem Relation Age of Onset  ? Stroke Mother   ?     Low BP stroke  ? Depression Father   ? Depression Sister   ?     Breast  ? Heart disease Other   ? Breast cancer Other   ? Lung cancer Other   ? Ovarian cancer Other   ? Stomach cancer Other   ? ? ?Prior to Admission medications   ?Medication Sig Start Date End Date Taking? Authorizing Provider  ?acetaminophen (TYLENOL) 500 MG tablet Take 500 mg by mouth every 6 (six) hours as needed for headache, fever, moderate pain or mild pain.   Yes [provider]  ?albuterol (PROVENTIL) (2.5 MG/3ML) 0.083% nebulizer solution Take 3 mLs (2.5 mg total) by nebulization every 6 (six) hours as needed for wheezing or shortness of breath. 12/12/21  Yes Johnson, Megan P, DO  ?albuterol (VENTOLIN HFA) 108 (90 Base) MCG/ACT  inhaler Inhale 2 puffs into the lungs every 6 (six) hours as needed for wheezing or shortness of breath. 02/27/21  Yes Borders, Kirt Boys, NP  ?aspirin 81 MG chewable tablet Chew 1 tablet (81 mg total) by mouth daily. 01/08/21  Yes Johnson, Megan P, DO  ?atorvastatin (LIPITOR) 40 MG tablet Take 1 tablet (40 mg total) by mouth daily. 11/04/21 02/02/22 Yes Theora Gianotti, NP  ?carvedilol (COREG) 12.5 MG tablet Take 1 tablet (12.5 mg total) by mouth 2 (two) times daily. 10/10/21 01/08/22 Yes End, Harrell Gave, MD  ?Cholecalciferol (VITAMIN D-1000 MAX ST) 25 MCG (1000 UT) tablet Take 1,000 Units by mouth daily.    Yes [provider]  ?citalopram (CELEXA) 40 MG tablet Take 1.5 tablets (60 mg total) by mouth daily. 09/06/21  Yes Johnson, Megan P, DO  ?clopidogrel (PLAVIX) 75 MG tablet Take 75 mg by mouth daily. 09/20/21  Yes [provider]  ?ferrous sulfate 325 (65 FE) MG tablet Take 1 tablet (325 mg total) by mouth daily with breakfast. 12/30/17 08/06/23 Yes Pyreddy, Reatha Harps, MD  ?furosemide (LASIX) 20 MG tablet Take 1 tablet (20 mg total) by mouth daily. 01/08/21  Yes Johnson, Megan P, DO  ?levETIRAcetam (KEPPRA) 500 MG tablet Take 1 tablet (500 mg total) by mouth at bedtime. 11/24/20  Yes Wieting, [name], MD  ?nitroGLYCERIN (NITROSTAT) 0.4 MG SL tablet Place 1 tablet (0.4 mg total) under the tongue every 5 (five) minutes as needed for chest pain. 09/12/21  Yes Enzo Bi, MD  ?nortriptyline (PAMELOR) 25 MG capsule Take 1 capsule (25 mg total) by mouth at bedtime. 10/16/21  Yes Johnson, Megan P, DO  ?ondansetron (ZOFRAN ODT) 4 MG disintegrating tablet Take 1 tablet (4 mg total) by mouth every 8 (eight) hours as needed for nausea or vomiting. 07/10/21  Yes Johnson, Megan P, DO  ?predniSONE (DELTASONE) 10 MG tablet Take 10 mg by mouth daily with breakfast.   Yes [provider]  ?tamsulosin (FLOMAX) 0.4 MG CAPS capsule Take 2 capsules (0.8 mg total) by mouth daily. 10/16/21  Yes Johnson, Megan P, DO   ?gentamicin cream (GARAMYCIN) 0.1 % Apply 1 application topically every other day. 03/18/19   [provider]  ?meclizine (ANTIVERT) 12.5 MG tablet Take 1 tablet (12.5 mg total) by mouth 2 (two) times d

## 2021-12-22 NOTE — Assessment & Plan Note (Signed)
Stable ?Continue Keppra 500 mg twice daily ?Seizure precautions ?

## 2021-12-22 NOTE — Assessment & Plan Note (Signed)
Stable ?Continue Celexa and nortriptyline ?

## 2021-12-22 NOTE — Assessment & Plan Note (Signed)
Stable and not acutely exacerbated ?Continue as needed bronchodilator therapy ?Continue prednisone 10 mg daily ?

## 2021-12-22 NOTE — Assessment & Plan Note (Signed)
Patient has a history of myelofibrosis and follows up with oncology as an outpatient. ?He is currently undergoing surveillance. ?Follow-up with oncology as an outpatient upon discharge ?

## 2021-12-22 NOTE — Assessment & Plan Note (Addendum)
--  chest x-ray shows interval worsening of pulmonary vascular congestion ?suggesting CHF. Interval increase in interstitial markings in both lungs suggest pulmonary edema. Small bilateral pleural effusions. ?--BNP 1500's. ?--started on IV lasix ?Plan: ?--cont IV lasix 80 BID ?Continue carvedilol ?Patient not on an ACE or ARB due to hyperkalemia ? ?? ?

## 2021-12-22 NOTE — Assessment & Plan Note (Addendum)
He had a cardiac cath done in 12/22 which showed severe three-vessel disease. ?Plan: ?--cont ASA and plavix ?

## 2021-12-23 ENCOUNTER — Inpatient Hospital Stay: Payer: Medicare HMO

## 2021-12-23 ENCOUNTER — Inpatient Hospital Stay: Payer: Medicare HMO | Admitting: Nurse Practitioner

## 2021-12-23 DIAGNOSIS — I5043 Acute on chronic combined systolic (congestive) and diastolic (congestive) heart failure: Secondary | ICD-10-CM | POA: Diagnosis not present

## 2021-12-23 DIAGNOSIS — N186 End stage renal disease: Secondary | ICD-10-CM | POA: Diagnosis not present

## 2021-12-23 DIAGNOSIS — R0600 Dyspnea, unspecified: Secondary | ICD-10-CM | POA: Diagnosis present

## 2021-12-23 DIAGNOSIS — N2581 Secondary hyperparathyroidism of renal origin: Secondary | ICD-10-CM | POA: Diagnosis not present

## 2021-12-23 DIAGNOSIS — D631 Anemia in chronic kidney disease: Secondary | ICD-10-CM | POA: Diagnosis not present

## 2021-12-23 LAB — BASIC METABOLIC PANEL
Anion gap: 10 (ref 5–15)
BUN: 73 mg/dL — ABNORMAL HIGH (ref 8–23)
CO2: 23 mmol/L (ref 22–32)
Calcium: 7.8 mg/dL — ABNORMAL LOW (ref 8.9–10.3)
Chloride: 104 mmol/L (ref 98–111)
Creatinine, Ser: 8.17 mg/dL — ABNORMAL HIGH (ref 0.61–1.24)
GFR, Estimated: 7 mL/min — ABNORMAL LOW (ref >60)
Glucose, Bld: 168 mg/dL — ABNORMAL HIGH (ref 70–99)
Potassium: 4.7 mmol/L (ref 3.5–5.1)
Sodium: 137 mmol/L (ref 135–145)

## 2021-12-23 LAB — CBC
HCT: 26 % — ABNORMAL LOW (ref 39.0–52.0)
Hemoglobin: 8.3 g/dL — ABNORMAL LOW (ref 13.0–17.0)
MCH: 29.5 pg (ref 26.0–34.0)
MCHC: 31.9 g/dL (ref 30.0–36.0)
MCV: 92.5 fL (ref 80.0–100.0)
Platelets: 125 10*3/uL — ABNORMAL LOW (ref 150–400)
RBC: 2.81 MIL/uL — ABNORMAL LOW (ref 4.22–5.81)
RDW: 15.8 % — ABNORMAL HIGH (ref 11.5–15.5)
WBC: 6.7 10*3/uL (ref 4.0–10.5)
nRBC: 0 % (ref 0.0–0.2)

## 2021-12-23 MED ORDER — ORAL CARE MOUTH RINSE
15.0000 mL | Freq: Two times a day (BID) | OROMUCOSAL | Status: DC
  Administered 2021-12-23 – 2021-12-24 (×3): 15 mL via OROMUCOSAL

## 2021-12-23 MED ORDER — RENA-VITE PO TABS
1.0000 | ORAL_TABLET | Freq: Every day | ORAL | Status: DC
  Administered 2021-12-23: 1 via ORAL
  Filled 2021-12-23: qty 1

## 2021-12-23 MED ORDER — FUROSEMIDE 10 MG/ML IJ SOLN
120.0000 mg | Freq: Two times a day (BID) | INTRAVENOUS | Status: DC
  Administered 2021-12-23 – 2021-12-24 (×2): 120 mg via INTRAVENOUS
  Filled 2021-12-23 (×4): qty 12

## 2021-12-23 MED ORDER — NEPRO/CARBSTEADY PO LIQD
237.0000 mL | Freq: Two times a day (BID) | ORAL | Status: DC
  Administered 2021-12-24: 237 mL via ORAL

## 2021-12-23 NOTE — Consult Note (Addendum)
Heart Failure Nurse Navigator Note ? ?HFrEF 35 to 40%.  Grade 2 diastolic dysfunction. ? ?He presented to the emergency room with sudden onset of dyspnea with substernal chest pressure.  He also noted orthopnea.  Chest x-ray revealed worsening pulmonary vascular congestion. ? ?Comorbidities: ? ?Coronary artery bypass grafting ?Coronary stent placement ?End-stage renal disease on peritoneal dialysis ? ? ?Medications: ? ?Aspirin 81 mg daily ?Atorvastatin 40 mg daily ?Carvedilol 12 and half milligrams 2 times a day ?Plavix 75 mg daily ?Furosemide 80 mg IV every 8 hours ? ? ?Labs: ? ?Sodium 137, potassium 4.7, chloride 104, CO2 23, BUN 73, creatinine 8.17, estimated GFR 7, BNP on admission 1548. ?Weight is 66.8 up from 64.8 of yesterday's documentation ?Output 1000 mL ?Output 10,000 mL ? ? ?Initial meeting with patient, he states that he has heard the term heart failure before and described it as the heart not pumping as it should. ? ?Discussed how he takes care of himself at home.  Does not weigh himself on a daily basis.  He continues to make urine.  Discussed doing daily weights after he voids first thing in the morning.  He voices understanding. ? ?Also discussed his diet 2000 mg sodium restriction.  At this point he got his wife on the phone and spoke to her by speaker phone.  He states that they do occasionally have bacon and eat hotdogs and some other processed foods.  States she has been buying low-sodium vegetables in a can. ? ?Along with changes in daily weights also discussed changes in symptoms to report. ? ?He was given the living with heart failure teaching booklet, zone magnet, information on low-sodium and weight chart. ? ?He had no further questions and told him that I would continue to follow along. ? ?Has an appointment in the outpatient heart failure clinic on April 12 at 3 in the afternoon.  He has a 1% no-show ratio which is 3 out of 236 appointments. ? ?Pricilla Riffle RN CHFN ?

## 2021-12-23 NOTE — Progress Notes (Signed)
?  Progress Note ? ? ?Patient: James Arnold GNF:[id number] DOB: Jul 16, 1956 DOA: 12/22/2021     1 ?DOS: the patient was seen and examined on 12/23/2021 ?  ?Brief hospital course: ?No notes on file ? ?Assessment and Plan: ?* Acute on chronic combined systolic and diastolic CHF (congestive heart failure) ([place]) ?--chest x-ray shows interval worsening of pulmonary vascular congestion ?suggesting CHF. Interval increase in interstitial markings in both lungs suggest pulmonary edema. Small bilateral pleural effusions. ?--BNP 1500's. ?--started on IV lasix ?Plan: ?--cont IV lasix 80 BID ?Continue carvedilol ?Patient not on an ACE or ARB due to hyperkalemia ? ?  ? ?COPD (chronic obstructive pulmonary disease) ([place]) ?Stable and not acutely exacerbated ?Continue as needed bronchodilator therapy ?Continue prednisone 10 mg daily ? ?Anemia of chronic disease ?H&H is stable ?Monitor closely ? ?ESRD on peritoneal dialysis ([place]) ?--iPD per nephro ? ?Hyperkalemia ?Most likely related to known history of end-stage renal disease ?Expect improvement following renal replacement therapy ? ?History of seizure ?Stable ?Continue Keppra 500 mg twice daily ?Seizure precautions ? ?Depression ?Stable ?Continue Celexa and nortriptyline ? ?Hypertension ?Blood pressure is stable ?Continue carvedilol ?--cont IV lasix ? ?Myelofibrosis ([place]) ?Patient has a history of myelofibrosis and follows up with oncology as an outpatient. ?He is currently undergoing surveillance. ?Follow-up with oncology as an outpatient upon discharge ? ?CAD (coronary artery disease) ?He had a cardiac cath done in 12/22 which showed severe three-vessel disease. ?Plan: ?--cont ASA and plavix ? ? ? ? ?  ? ?Subjective:  ?Pt reported breathing improved.  No swelling.  Did void more with IV lasix. ? ? ?Physical Exam: ? ?Constitutional: NAD, AAOx3 ?HEENT: conjunctivae and lids normal, EOMI ?CV: No cyanosis.   ?RESP: normal respiratory effort, on RA ?Extremities: No effusions, edema in  BLE ?SKIN: warm, dry ?Neuro: II - XII grossly intact.   ?Psych: Normal mood and affect.  Appropriate judgement and reason ? ? ?Data Reviewed: ? ?Family Communication:  ? ?Disposition: ?Status is: Inpatient ? ? Planned Discharge Destination: Home ? ? ? ?Time spent: 50 minutes ? ?Author: ?Enzo Bi, MD ?12/23/2021 3:36 PM ? ?For on call review [email or web address].  ?

## 2021-12-23 NOTE — Progress Notes (Signed)
?[facility]  ?ROUNDING NOTE  ? ?Subjective:  ? ?James Arnold is a 66 year old male with past medical conditions including COPD, hypertension, coronary artery disease with recent stent angioplasty, and end-stage renal disease on peritoneal dialysis.  Patient presents to the emergency department with complaints of shortness of breath and has been admitted for Acute on chronic systolic CHF (congestive heart failure) ([place]) [I50.23] ?Acute on chronic congestive heart failure, unspecified heart failure type ([place]) [I50.9] ? ?Patient is known to our clinic and is followed by Dr. Holley Raring outpatient. Patient reports his last dialysis treatment was last night. He states he has maintained his PD schedule. Denies eating meals high in salt or drinking beyond his fluid restriction. Patient states he was up using restroom and became short of breath. He states he gave himself a breathing treatment with little relief. He became short of breath again and asked wife to send him to ED. States his breathing has improved since admission.  ? ?Labs include Potassium 5.3, BUN 65, Creatinine 7.58 with GFR 7. BNP 1548. Respiratory panel negative for Influenza and Covid 19. Chest xray shows worsening pulmonary congestion. Chest CT shows small bilateral pleural effusions.  ? ?We have been consulted to manage dialysis needs.  ? ? ?Objective:  ?Vital signs in last 24 hours:  ?Temp:  [97.6 ?F (36.4 ?C)-98.6 ?F (37 ?C)] 97.6 ?F (36.4 ?C) (04/03 1136) ?Pulse Rate:  [92-105] 93 (04/03 1136) ?Resp:  [18-23] 20 (04/03 1136) ?BP: (125-145)/(69-89) 125/76 (04/03 1136) ?SpO2:  [95 %-100 %] 98 % (04/03 1136) ?Weight:  [64.8 kg-66.8 kg] 64.8 kg (04/03 0845) ? ?Weight change:  ?Filed Weights  ? 12/22/21 1152 12/22/21 2313 12/23/21 0845  ?Weight: 67.1 kg 66.8 kg 64.8 kg  ? ? ?Intake/Output: ?I/O last 3 completed shifts: ?In: 240 [P.O.:240] ?Out: 500 [Urine:500] ?  ?Intake/Output this shift: ? Total I/O ?In: 1480 [P.O.:480; Other:1000] ?Out: 94765  [Urine:210; [id number]] ? ?Physical Exam: ?General: NAD  ?Head: Normocephalic, atraumatic. Moist oral mucosal membranes  ?Eyes: Anicteric  ?Lungs:  Clear to auscultation, normal effort, room air  ?Heart: Regular rate and rhythm  ?Abdomen:  Soft, nontender, PD catheter RUQ  ?Extremities:  No peripheral edema.  ?Neurologic: Nonfocal, moving all four extremities  ?Skin: No lesions  ?Access: PD catheter  ? ? ?Basic Metabolic Panel: ?Recent Labs  ?Lab 12/22/21 ?1152 12/23/21 ?0403  ?NA 140 137  ?K 5.3* 4.7  ?CL 105 104  ?CO2 22 23  ?GLUCOSE 99 168*  ?BUN 65* 73*  ?CREATININE 7.58* 8.17*  ?CALCIUM 7.6* 7.8*  ? ? ?Liver Function Tests: ?Recent Labs  ?Lab 12/22/21 ?1152  ?AST 15  ?ALT 12  ?ALKPHOS 56  ?BILITOT 0.8  ?PROT 6.1*  ?ALBUMIN 3.3*  ? ?No results for input(s): LIPASE, AMYLASE in the last 168 hours. ?No results for input(s): AMMONIA in the last 168 hours. ? ?CBC: ?Recent Labs  ?Lab 12/22/21 ?1152 12/23/21 ?0403  ?WBC 4.5 6.7  ?NEUTROABS 3.9  --   ?HGB 8.6* 8.3*  ?HCT 28.5* 26.0*  ?MCV 95.0 92.5  ?PLT 139* 125*  ? ? ?Cardiac Enzymes: ?No results for input(s): CKTOTAL, CKMB, CKMBINDEX, TROPONINI in the last 168 hours. ? ?BNP: ?Invalid input(s): POCBNP ? ?CBG: ?No results for input(s): GLUCAP in the last 168 hours. ? ?Microbiology: ?Results for orders placed or performed during the hospital encounter of 12/22/21  ?Resp Panel by RT-PCR (Flu A&B, Covid) Nasopharyngeal Swab     Status: None  ? Collection Time: 12/22/21  1:32 PM  ?  Specimen: Nasopharyngeal Swab; Nasopharyngeal(NP) swabs in vial transport medium  ?Result Value Ref Range Status  ? SARS Coronavirus 2 by RT PCR NEGATIVE NEGATIVE Final  ?  Comment: (NOTE) ?SARS-CoV-2 target nucleic acids are NOT DETECTED. ? ?The SARS-CoV-2 RNA is generally detectable in upper respiratory ?specimens during the acute phase of infection. The lowest ?concentration of SARS-CoV-2 viral copies this assay can detect is ?138 copies/mL. A negative result does not preclude  SARS-Cov-2 ?infection and should not be used as the sole basis for treatment or ?other patient management decisions. A negative result may occur with  ?improper specimen collection/handling, submission of specimen other ?than nasopharyngeal swab, presence of viral mutation(s) within the ?areas targeted by this assay, and inadequate number of viral ?copies(<138 copies/mL). A negative result must be combined with ?clinical observations, patient history, and epidemiological ?information. The expected result is Negative. ? ?Fact Sheet for Patients:  ?EntrepreneurPulse.com.au ? ?Fact Sheet for Healthcare Providers:  ?IncredibleEmployment.be ? ?This test is no t yet approved or cleared by the [place] FDA and  ?has been authorized for detection and/or diagnosis of SARS-CoV-2 by ?FDA under an Emergency Use Authorization (EUA). This EUA will remain  ?in effect (meaning this test can be used) for the duration of the ?COVID-19 declaration under Section 564(b)(1) of the Act, 21 ?U.S.C.section 360bbb-3(b)(1), unless the authorization is terminated  ?or revoked sooner.  ? ? ?  ? Influenza A by PCR NEGATIVE NEGATIVE Final  ? Influenza B by PCR NEGATIVE NEGATIVE Final  ?  Comment: (NOTE) ?The Xpert Xpress SARS-CoV-2/FLU/RSV plus assay is intended as an aid ?in the diagnosis of influenza from Nasopharyngeal swab specimens and ?should not be used as a sole basis for treatment. Nasal washings and ?aspirates are unacceptable for Xpert Xpress SARS-CoV-2/FLU/RSV ?testing. ? ?Fact Sheet for Patients: ?EntrepreneurPulse.com.au ? ?Fact Sheet for Healthcare Providers: ?IncredibleEmployment.be ? ?This test is not yet approved or cleared by the [place] FDA and ?has been authorized for detection and/or diagnosis of SARS-CoV-2 by ?FDA under an Emergency Use Authorization (EUA). This EUA will remain ?in effect (meaning this test can be used) for the duration of  the ?COVID-19 declaration under Section 564(b)(1) of the Act, 21 U.S.C. ?section 360bbb-3(b)(1), unless the authorization is terminated or ?revoked. ? ?Performed at [facility], [place], ?[place] [address] ?  ? ? ?Coagulation Studies: ?No results for input(s): LABPROT, INR in the last 72 hours. ? ?Urinalysis: ?No results for input(s): COLORURINE, LABSPEC, [place], GLUCOSEU, HGBUR, BILIRUBINUR, KETONESUR, PROTEINUR, UROBILINOGEN, NITRITE, LEUKOCYTESUR in the last 72 hours. ? ?Invalid input(s): APPERANCEUR  ? ? ?Imaging: ?DG Chest 2 View ? ?Result Date: 12/22/2021 ?CLINICAL DATA:  Shortness of breath EXAM: CHEST - 2 VIEW COMPARISON:  Previous studies including the examination of 11/18/2021 FINDINGS: Transverse diameter of heart is increased. Central pulmonary vessels are more prominent. There is interval increase in interstitial markings in the parahilar regions and lower lung fields. There is minimal blunting of lateral CP angles. There is no pneumothorax. There is linear lucency in the right lower lung fields which may suggest pneumoperitoneum, possibly related to peritoneal dialysis or small loculated right pneumothorax. There is no demonstrable apical pneumothorax. IMPRESSION: There is interval worsening of pulmonary vascular congestion suggesting CHF. Interval increase in interstitial markings in both lungs suggest pulmonary edema. Small bilateral pleural effusions. Faint linear lucency is seen in the right lower lung fields which may suggest minimal pneumoperitoneum related to peritoneal dialysis or suggest small loculated right pneumothorax. There is no evidence  of right apical pneumothorax. Electronically Signed   By: Elmer Picker M.D.   On: 12/22/2021 12:36  ? ?CT Chest Wo Contrast ? ?Result Date: 12/22/2021 ?CLINICAL DATA:  Evaluate for pneumonia.  Respiratory distress. EXAM: CT CHEST WITHOUT CONTRAST TECHNIQUE: Multidetector CT imaging of the chest was performed following the  standard protocol without IV contrast. RADIATION DOSE REDUCTION: This exam was performed according to the departmental dose-optimization program which includes automated exposure control, adjustment of the mA and/or kV

## 2021-12-23 NOTE — Progress Notes (Signed)
PD Note ?PD treatment initiated, patient without concerns, site intact, no signs of infection, vital signs are stable. Patient resting comfortably in bed. Initial fill started.  ?

## 2021-12-23 NOTE — Progress Notes (Signed)
Initial Nutrition Assessment ? ?DOCUMENTATION CODES:  ? ?Not applicable ? ?INTERVENTION:  ? ?-Nepro Shake po BID, each supplement provides 425 kcal and 19 grams protein  ?-Renal MVI daily ? ?NUTRITION DIAGNOSIS:  ? ?Increased nutrient needs related to chronic illness (ESRD on PD) as evidenced by estimated needs. ? ?GOAL:  ? ?Patient will meet greater than or equal to 90% of their needs ? ?MONITOR:  ? ?PO intake, Supplement acceptance, Labs, Weight trends, Skin, I & O's ? ?REASON FOR ASSESSMENT:  ? ?Malnutrition Screening Tool ?  ? ?ASSESSMENT:  ? ?James Arnold is a 66 y.o. male with medical history significant for end-stage renal disease on peritoneal dialysis, coronary artery disease status post recent stent angioplasty, COPD, hypertension, myelofibrosis who presents to the ER via EMS for evaluation of sudden onset shortness of breath. ? ?Pt admitted with CHF exacerbation.  ? ?Reviewed I/O's: -260 ml x 24 hours ? ?UOP: 500 ml x 24 hours ?  ?Pt unavailable at time of visit. Attempted to speak with pt via call to hospital room phone, however, unable to reach. RD unable to obtain further nutrition-related history or complete nutrition-focused physical exam at this time.   ? ?Pt with good appetite. Noted meal completion 100% of 2 gram sodium diet.  ? ?Reviewed wt hx; pt has experienced a 3.1% wt loss over the past 3 months, which is not significant for time frame.  ? ?Medications reviewed and include vitamin D3, ferous sulfate, lasix, and prednisone.  ? ?Labs reviewed: CBGS: 123, Phos: 7.8, Mg: 1.6.  ? ?Diet Order:   ?Diet Order   ? ?       ?  Diet 2 gram sodium Room service appropriate? Yes; Fluid consistency: Thin  Diet effective now       ?  ? ?  ?  ? ?  ? ? ?EDUCATION NEEDS:  ? ?No education needs have been identified at this time ? ?Skin:  Skin Assessment: Skin Integrity Issues: ?Skin Integrity Issues:: Other (Comment) ?Other: skin tear lt elbow ? ?Last BM:  12/22/21 ? ?Height:  ? ?Ht Readings from Last 1  Encounters:  ?12/22/21 '5\' 7"'$  (1.702 m)  ? ? ?Weight:  ? ?Wt Readings from Last 1 Encounters:  ?12/23/21 64.8 kg  ? ? ?Ideal Body Weight:  67.3 kg ? ?BMI:  Body mass index is 22.37 kg/m?. ? ?Estimated Nutritional Needs:  ? ?Kcal:  1950-2150 ? ?Protein:  90-105 grams ? ?Fluid:  > 1.9 L ? ? ? ?Loistine Chance, RD, LDN, CDCES ?Registered Dietitian II ?Certified Diabetes Care and Education Specialist ?Please refer to [facility] for RD and/or RD on-call/weekend/after hours pager  ?

## 2021-12-24 ENCOUNTER — Telehealth: Payer: Self-pay | Admitting: Internal Medicine

## 2021-12-24 ENCOUNTER — Observation Stay: Admit: 2021-12-24 | Payer: Medicare HMO

## 2021-12-24 DIAGNOSIS — I5043 Acute on chronic combined systolic (congestive) and diastolic (congestive) heart failure: Secondary | ICD-10-CM | POA: Diagnosis not present

## 2021-12-24 DIAGNOSIS — N186 End stage renal disease: Secondary | ICD-10-CM | POA: Diagnosis not present

## 2021-12-24 DIAGNOSIS — N2581 Secondary hyperparathyroidism of renal origin: Secondary | ICD-10-CM | POA: Diagnosis not present

## 2021-12-24 DIAGNOSIS — D631 Anemia in chronic kidney disease: Secondary | ICD-10-CM | POA: Diagnosis not present

## 2021-12-24 LAB — CBC
HCT: 25.8 % — ABNORMAL LOW (ref 39.0–52.0)
Hemoglobin: 8.2 g/dL — ABNORMAL LOW (ref 13.0–17.0)
MCH: 29.1 pg (ref 26.0–34.0)
MCHC: 31.8 g/dL (ref 30.0–36.0)
MCV: 91.5 fL (ref 80.0–100.0)
Platelets: 128 10*3/uL — ABNORMAL LOW (ref 150–400)
RBC: 2.82 MIL/uL — ABNORMAL LOW (ref 4.22–5.81)
RDW: 15.5 % (ref 11.5–15.5)
WBC: 5.1 10*3/uL (ref 4.0–10.5)
nRBC: 0 % (ref 0.0–0.2)

## 2021-12-24 LAB — BASIC METABOLIC PANEL
Anion gap: 15 (ref 5–15)
BUN: 81 mg/dL — ABNORMAL HIGH (ref 8–23)
CO2: 22 mmol/L (ref 22–32)
Calcium: 7.6 mg/dL — ABNORMAL LOW (ref 8.9–10.3)
Chloride: 101 mmol/L (ref 98–111)
Creatinine, Ser: 7.54 mg/dL — ABNORMAL HIGH (ref 0.61–1.24)
GFR, Estimated: 7 mL/min — ABNORMAL LOW (ref >60)
Glucose, Bld: 82 mg/dL (ref 70–99)
Potassium: 4.3 mmol/L (ref 3.5–5.1)
Sodium: 138 mmol/L (ref 135–145)

## 2021-12-24 LAB — MAGNESIUM: Magnesium: 1.7 mg/dL (ref 1.7–2.4)

## 2021-12-24 MED ORDER — NEPRO/CARBSTEADY PO LIQD: 237.0000 mL | Freq: Two times a day (BID) | ORAL | 0 refills | Status: DC

## 2021-12-24 MED ORDER — RENA-VITE PO TABS: 1.0000 | ORAL_TABLET | Freq: Every day | ORAL | 0 refills | Status: DC

## 2021-12-24 MED ORDER — FUROSEMIDE 80 MG PO TABS: 80.0000 mg | ORAL_TABLET | Freq: Every day | ORAL | 2 refills | Status: AC

## 2021-12-24 NOTE — Care Management Obs Status (Signed)
MEDICARE OBSERVATION STATUS NOTIFICATION ? ? ?Patient Details  ?Name: James Arnold ?MRN: [id number] ?Date of Birth: 21-Jan-1956 ? ? ?Medicare Observation Status Notification Given:  Yes ? ? ? ?Alberteen Sam, LCSW ?12/24/2021, 8:39 AM ?

## 2021-12-24 NOTE — Plan of Care (Signed)

## 2021-12-24 NOTE — Care Management CC44 (Signed)
Condition Code 44 Documentation Completed ? ?Patient Details  ?Name: James Arnold ?MRN: [id number] ?Date of Birth: 30-Aug-1956 ? ? ?Condition Code 44 given:  Yes ?Patient signature on Condition Code 44 notice:  Yes ?Documentation of 2 MD's agreement:  Yes ?Code 44 added to claim:  Yes ? ? ? ?Alberteen Sam, LCSW ?12/24/2021, 8:39 AM ? ?

## 2021-12-24 NOTE — Progress Notes (Addendum)
?[facility]  ?ROUNDING NOTE  ? ?Subjective:  ? ?James Arnold is a 66 year old male with past medical conditions including COPD, hypertension, coronary artery disease with recent stent angioplasty, and end-stage renal disease on peritoneal dialysis.  Patient presents to the emergency department with complaints of shortness of breath and has been admitted for Acute on chronic systolic CHF (congestive heart failure) ([place]) [I50.23] ?Acute on chronic congestive heart failure, unspecified heart failure type ([place]) [I50.9] ?Dyspnea [R06.00] ? ?Patient is known to our clinic and is followed by Dr. Holley Raring outpatient. Patient reports his last dialysis treatment was last night.  ? ?Patient seen sitting up in bed ?Alert and oriented ?States his breathing has improved  ? ? ? ?Objective:  ?Vital signs in last 24 hours:  ?Temp:  [97.4 ?F (36.3 ?C)-97.9 ?F (36.6 ?C)] 97.9 ?F (36.6 ?C) (04/04 1137) ?Pulse Rate:  [84-90] 84 (04/04 1137) ?Resp:  [16-20] 16 (04/04 1137) ?BP: (117-140)/(65-82) 117/65 (04/04 1137) ?SpO2:  [97 %-100 %] 98 % (04/04 1137) ?Weight:  [65.9 kg] 65.9 kg (04/04 0500) ? ?Weight change: -2.332 kg ?Filed Weights  ? 12/22/21 2313 12/23/21 0845 12/24/21 0500  ?Weight: 66.8 kg 64.8 kg 65.9 kg  ? ? ?Intake/Output: ?I/O last 3 completed shifts: ?In: 2690 [P.O.:1690; Other:1000] ?Out: 24097 [DZHGD:9242; ASTMH:96222] ?  ?Intake/Output this shift: ? Total I/O ?In: 480 [P.O.:480] ?Out: -  ? ?Physical Exam: ?General: NAD  ?Head: Normocephalic, atraumatic. Moist oral mucosal membranes  ?Eyes: Anicteric  ?Lungs:  Clear to auscultation, normal effort, room air  ?Heart: Regular rate and rhythm  ?Abdomen:  Soft, nontender, PD catheter RUQ  ?Extremities:  No peripheral edema.  ?Neurologic: Nonfocal, moving all four extremities  ?Skin: No lesions  ?Access: PD catheter  ? ? ?Basic Metabolic Panel: ?Recent Labs  ?Lab 12/22/21 ?1152 12/23/21 ?0403 12/24/21 ?0355  ?NA 140 137 138  ?K 5.3* 4.7 4.3  ?CL 105 104 101  ?CO2 '22  23 22  '$ ?GLUCOSE 99 168* 82  ?BUN 65* 73* 81*  ?CREATININE 7.58* 8.17* 7.54*  ?CALCIUM 7.6* 7.8* 7.6*  ?MG  --   --  1.7  ? ? ? ?Liver Function Tests: ?Recent Labs  ?Lab 12/22/21 ?1152  ?AST 15  ?ALT 12  ?ALKPHOS 56  ?BILITOT 0.8  ?PROT 6.1*  ?ALBUMIN 3.3*  ? ? ?No results for input(s): LIPASE, AMYLASE in the last 168 hours. ?No results for input(s): AMMONIA in the last 168 hours. ? ?CBC: ?Recent Labs  ?Lab 12/22/21 ?1152 12/23/21 ?0403 12/24/21 ?0355  ?WBC 4.5 6.7 5.1  ?NEUTROABS 3.9  --   --   ?HGB 8.6* 8.3* 8.2*  ?HCT 28.5* 26.0* 25.8*  ?MCV 95.0 92.5 91.5  ?PLT 139* 125* 128*  ? ? ? ?Cardiac Enzymes: ?No results for input(s): CKTOTAL, CKMB, CKMBINDEX, TROPONINI in the last 168 hours. ? ?BNP: ?Invalid input(s): POCBNP ? ?CBG: ?No results for input(s): GLUCAP in the last 168 hours. ? ?Microbiology: ?Results for orders placed or performed during the hospital encounter of 12/22/21  ?Resp Panel by RT-PCR (Flu A&B, Covid) Nasopharyngeal Swab     Status: None  ? Collection Time: 12/22/21  1:32 PM  ? Specimen: Nasopharyngeal Swab; Nasopharyngeal(NP) swabs in vial transport medium  ?Result Value Ref Range Status  ? SARS Coronavirus 2 by RT PCR NEGATIVE NEGATIVE Final  ?  Comment: (NOTE) ?SARS-CoV-2 target nucleic acids are NOT DETECTED. ? ?The SARS-CoV-2 RNA is generally detectable in upper respiratory ?specimens during the acute phase of infection. The lowest ?concentration of  SARS-CoV-2 viral copies this assay can detect is ?138 copies/mL. A negative result does not preclude SARS-Cov-2 ?infection and should not be used as the sole basis for treatment or ?other patient management decisions. A negative result may occur with  ?improper specimen collection/handling, submission of specimen other ?than nasopharyngeal swab, presence of viral mutation(s) within the ?areas targeted by this assay, and inadequate number of viral ?copies(<138 copies/mL). A negative result must be combined with ?clinical observations, patient  history, and epidemiological ?information. The expected result is Negative. ? ?Fact Sheet for Patients:  ?EntrepreneurPulse.com.au ? ?Fact Sheet for Healthcare Providers:  ?IncredibleEmployment.be ? ?This test is no t yet approved or cleared by the [place] FDA and  ?has been authorized for detection and/or diagnosis of SARS-CoV-2 by ?FDA under an Emergency Use Authorization (EUA). This EUA will remain  ?in effect (meaning this test can be used) for the duration of the ?COVID-19 declaration under Section 564(b)(1) of the Act, 21 ?U.S.C.section 360bbb-3(b)(1), unless the authorization is terminated  ?or revoked sooner.  ? ? ?  ? Influenza A by PCR NEGATIVE NEGATIVE Final  ? Influenza B by PCR NEGATIVE NEGATIVE Final  ?  Comment: (NOTE) ?The Xpert Xpress SARS-CoV-2/FLU/RSV plus assay is intended as an aid ?in the diagnosis of influenza from Nasopharyngeal swab specimens and ?should not be used as a sole basis for treatment. Nasal washings and ?aspirates are unacceptable for Xpert Xpress SARS-CoV-2/FLU/RSV ?testing. ? ?Fact Sheet for Patients: ?EntrepreneurPulse.com.au ? ?Fact Sheet for Healthcare Providers: ?IncredibleEmployment.be ? ?This test is not yet approved or cleared by the [place] FDA and ?has been authorized for detection and/or diagnosis of SARS-CoV-2 by ?FDA under an Emergency Use Authorization (EUA). This EUA will remain ?in effect (meaning this test can be used) for the duration of the ?COVID-19 declaration under Section 564(b)(1) of the Act, 21 U.S.C. ?section 360bbb-3(b)(1), unless the authorization is terminated or ?revoked. ? ?Performed at [facility], [place], ?[place] [address] ?  ? ? ?Coagulation Studies: ?No results for input(s): LABPROT, INR in the last 72 hours. ? ?Urinalysis: ?No results for input(s): COLORURINE, LABSPEC, [place], GLUCOSEU, HGBUR, BILIRUBINUR, KETONESUR, PROTEINUR,  UROBILINOGEN, NITRITE, LEUKOCYTESUR in the last 72 hours. ? ?Invalid input(s): APPERANCEUR  ? ? ?Imaging: ?No results found. ? ? ?Medications:  ? ? dialysis solution 2.5% low-MG/low-CA    ? furosemide 120 mg (12/24/21 4680)  ? ? aspirin  324 mg Oral Once  ? aspirin  81 mg Oral Daily  ? atorvastatin  40 mg Oral Daily  ? carvedilol  12.5 mg Oral BID  ? cholecalciferol  1,000 Units Oral Daily  ? citalopram  60 mg Oral Daily  ? clopidogrel  75 mg Oral Daily  ? feeding supplement (NEPRO CARB STEADY)  237 mL Oral BID BM  ? ferrous sulfate  325 mg Oral Q breakfast  ? gentamicin cream  1 application. Topical Daily  ? heparin  5,000 Units Subcutaneous Q8H  ? levETIRAcetam  500 mg Oral QHS  ? mouth rinse  15 mL Mouth Rinse BID  ? multivitamin  1 tablet Oral QHS  ? nortriptyline  25 mg Oral QHS  ? pantoprazole  20 mg Oral Daily  ? predniSONE  10 mg Oral Q breakfast  ? tamsulosin  0.8 mg Oral Daily  ? ?acetaminophen **OR** acetaminophen, acetaminophen, albuterol, meclizine, nitroGLYCERIN, ondansetron **OR** ondansetron (ZOFRAN) IV, polyethylene glycol ? ?Assessment/ Plan:  ?James Arnold is a 66 y.o.  male with past medical conditions including  COPD, hypertension, coronary artery disease with recent stent angioplasty, and end-stage renal disease on peritoneal dialysis.  Patient presents to the emergency department with complaints of shortness of breath and has been admitted for Acute on chronic systolic CHF (congestive heart failure) ([place]) [I50.23] ?Acute on chronic congestive heart failure, unspecified heart failure type ([place]) [I50.9] ?Dyspnea [R06.00] ? ? ?End stage renal disease on peritoneal dialysis. Will maintain nightly treatments during admission. ?Received dialysis overnight, UF goal 293m removed. Discussed with patient that he may need to continue nightly treatments, 7 nights a week. Patient was reduced to 6 treatments per week, outpatient, due to sustained urine output. Urine output has decreased, and missing a  day of treatment may be too much at this time. Patient is cleared to discharge from renal stance and will follow closely in office ? ?2. Anemia of chronic kidney disease ?Lab Results  ?Component Value Date  ? H

## 2021-12-24 NOTE — Telephone Encounter (Signed)
Reviewed the patient's chart prior to calling his wife (ok per DPR). ?It appears as though he is currently discharged. ? ?I called James Arnold (spouse) and confirmed the patient is currently on his way home. ?He did not have an echocardiogram in the hospital, but he will be here in the morning for his already scheduled echo at 9:00 am. ? ?James Arnold was appreciative for the call back. ? ?

## 2021-12-24 NOTE — Telephone Encounter (Signed)
Patient's wife states the patient is in [facility], [place] and ready for release. but they are waiting for him to have the echo done. She states she has tried to tell them he already has an echo scheduled for tomorrow morning. She states she is trying to get him released so he can have the test done tomorrow as scheduled and states there is no reason for him to stay another night in the hospital.   ?

## 2021-12-24 NOTE — Discharge Summary (Signed)
? ?Physician Discharge Summary ? ? ?James Arnold  male DOB: 02-Oct-1955  ?NWG:[id number] ? ?PCP: Valerie Roys, DO ? ?Admit date: 12/22/2021 ?Discharge date: 12/24/2021 ? ?Admitted From: home ?Disposition:  home ?CODE STATUS: Full code ? ?Discharge Instructions   ? ? Discharge wound care:   Complete by: As directed ?  ? Clean skin near exit site with chloraprep swab sticks.  Starting at catheter, use circular pattern around exit site, moving towards outer edges of area covered by dressing.  Apply gentamicin cream to site once daily.  Cover with dry dressing. ?- ?-  ? ?  ? ?Hospital Course:  ?For full details, please see H&P, progress notes, consult notes and ancillary notes.  ?Briefly,  ?James Arnold is a 66 y.o. male with medical history significant for end-stage renal disease on peritoneal dialysis, coronary artery disease status post recent stent angioplasty, COPD, hypertension, myelofibrosis who presented to the ER via EMS for evaluation of sudden onset shortness of breath. ? ?* Acute on chronic combined systolic and diastolic CHF (congestive heart failure) ([place]) ?--chest x-ray showed interval worsening of pulmonary vascular congestion, Interval increase in interstitial markings in both lungs suggest pulmonary edema. Small bilateral pleural effusions. ?--BNP 1500's. ?--LVEF 35 to 40% with grade II DD back in Dec 2022. ?--started on IV lasix and pt did have increased urine output in response. ?--Continue carvedilol ?--Patient not on an ACE or ARB due to hyperkalemia ?--Pt was discharged on oral lasix 80 mg daily (increased from 20 mg), per nephro rec. ?  ?COPD (chronic obstructive pulmonary disease) ([place]) ?Stable and not acutely exacerbated ? ?Anemia of chronic disease ?Hgb stable ~8's. ?  ?ESRD on peritoneal dialysis ([place]) ?--iPD per nephro ?  ?Hyperkalemia, resolved ?Most likely related to known history of end-stage renal disease ?  ?History of seizure ?Continue Keppra 500 mg twice daily ?   ?Depression ?Stable ?Continue Celexa and nortriptyline ?  ?Hypertension ?Blood pressure is stable ?Continue carvedilol ?--Pt was discharged on oral lasix 80 mg daily (increased from 20 mg), per nephro rec. ?  ?Myelofibrosis ([place]) ?Patient has a history of myelofibrosis and follows up with oncology as an outpatient. ?  ?CAD (coronary artery disease) ?He had a cardiac cath done in 12/22 which showed severe three-vessel disease. ?--cont ASA and plavix ?--cont statin ? ? ?Discharge Diagnoses:  ?Principal Problem: ?  Acute on chronic combined systolic and diastolic CHF (congestive heart failure) ([place]) ?Active Problems: ?  CAD (coronary artery disease) ?  Myelofibrosis ([place]) ?  Hypertension ?  Depression ?  History of seizure ?  Hyperkalemia ?  ESRD on peritoneal dialysis [facility]) ?  Anemia of chronic disease ?  COPD (chronic obstructive pulmonary disease) ([place]) ?  Dyspnea ? ? ?30 Day Unplanned Readmission Risk Score   ? ?Flowsheet Row ED to Hosp-Admission (Current) from 12/22/2021 in [facility]  ?30 Day Unplanned Readmission Risk Score (%) 34.98 Filed at 12/23/2021 1600  ? ?  ? ? This score is the patient's risk of an unplanned readmission within 30 days of being discharged (0 -100%). The score is based on dignosis, age, lab data, medications, orders, and past utilization.   ?Low:  0-14.9   Medium: 15-21.9   High: 22-29.9   Extreme: 30 and above ? ?  ? ?  ? ? ?Discharge Instructions: ? ?Allergies as of 12/24/2021   ?No Known Allergies ?  ? ?  ?Medication List  ?  ? ?TAKE these medications   ? ?acetaminophen  500 MG tablet ?Commonly known as: TYLENOL ?Take 500 mg by mouth every 6 (six) hours as needed for headache, fever, moderate pain or mild pain. ?  ?albuterol 108 (90 Base) MCG/ACT inhaler ?Commonly known as: VENTOLIN HFA ?Inhale 2 puffs into the lungs every 6 (six) hours as needed for wheezing or shortness of breath. ?  ?albuterol (2.5 MG/3ML) 0.083% nebulizer solution ?Commonly known as:  PROVENTIL ?Take 3 mLs (2.5 mg total) by nebulization every 6 (six) hours as needed for wheezing or shortness of breath. ?  ?aspirin 81 MG chewable tablet ?Chew 1 tablet (81 mg total) by mouth daily. ?  ?atorvastatin 40 MG tablet ?Commonly known as: LIPITOR ?Take 1 tablet (40 mg total) by mouth daily. ?  ?carvedilol 12.5 MG tablet ?Commonly known as: COREG ?Take 1 tablet (12.5 mg total) by mouth 2 (two) times daily. ?  ?citalopram 40 MG tablet ?Commonly known as: CELEXA ?Take 1.5 tablets (60 mg total) by mouth daily. ?  ?clopidogrel 75 MG tablet ?Commonly known as: PLAVIX ?Take 75 mg by mouth daily. ?  ?feeding supplement (NEPRO CARB STEADY) Liqd ?Take 237 mLs by mouth 2 (two) times daily between meals. ?  ?ferrous sulfate 325 (65 FE) MG tablet ?Take 1 tablet (325 mg total) by mouth daily with breakfast. ?  ?furosemide 80 MG tablet ?Commonly known as: LASIX ?Take 1 tablet (80 mg total) by mouth daily. Increased from 20 mg daily. ?What changed:  ?medication strength ?how much to take ?additional instructions ?  ?gentamicin cream 0.1 % ?Commonly known as: GARAMYCIN ?Apply 1 application topically every other day. ?  ?levETIRAcetam 500 MG tablet ?Commonly known as: KEPPRA ?Take 1 tablet (500 mg total) by mouth at bedtime. ?  ?meclizine 12.5 MG tablet ?Commonly known as: ANTIVERT ?Take 1 tablet (12.5 mg total) by mouth 2 (two) times daily as needed for dizziness. ?  ?multivitamin Tabs tablet ?Take 1 tablet by mouth at bedtime. ?  ?nitroGLYCERIN 0.4 MG SL tablet ?Commonly known as: NITROSTAT ?Place 1 tablet (0.4 mg total) under the tongue every 5 (five) minutes as needed for chest pain. ?  ?nortriptyline 25 MG capsule ?Commonly known as: Pamelor ?Take 1 capsule (25 mg total) by mouth at bedtime. ?  ?ondansetron 4 MG disintegrating tablet ?Commonly known as: Zofran ODT ?Take 1 tablet (4 mg total) by mouth every 8 (eight) hours as needed for nausea or vomiting. ?  ?pantoprazole 20 MG tablet ?Commonly known as: PROTONIX ?Take  20 mg by mouth daily. ?  ?polyethylene glycol 17 g packet ?Commonly known as: MIRALAX / GLYCOLAX ?Take 17 g by mouth daily as needed. ?  ?predniSONE 10 MG tablet ?Commonly known as: DELTASONE ?Take 10 mg by mouth daily with breakfast. ?  ?tamsulosin 0.4 MG Caps capsule ?Commonly known as: FLOMAX ?Take 2 capsules (0.8 mg total) by mouth daily. ?  ?Vitamin D-1000 Max St 25 MCG (1000 UT) tablet ?Generic drug: Cholecalciferol ?Take 1,000 Units by mouth daily. ?  ? ?  ? ?  ?  ? ? ?  ?Discharge Care Instructions  ?(From admission, onward)  ?  ? ? ?  ? ?  Start     Ordered  ? 12/24/21 0000  Discharge wound care:       ?Comments: Clean skin near exit site with chloraprep swab sticks.  Starting at catheter, use circular pattern around exit site, moving towards outer edges of area covered by dressing.  Apply gentamicin cream to site once daily.  Cover with dry dressing. ?- ?-  ?  12/24/21 1449  ? ?  ?  ? ?  ? ? ? Follow-up Information   ? ? Johnson, Megan P, DO Follow up in 1 week(s).   ?Specialty: Family Medicine ?Contact information: ?[address] ?[place] [address] ?[phone] ? ? ?  ?  ? ? End, Harrell Gave, MD .   ?Specialty: Cardiology ?Contact information: ?[address] ?[place] [address] ?[phone] ? ? ?  ?  ? ?  ?  ? ?  ? ? ?No Known Allergies ? ? ?The results of significant diagnostics from this hospitalization (including imaging, microbiology, ancillary and laboratory) are listed below for reference.  ? ?Consultations: ? ? ?Procedures/Studies: ?DG Chest 2 View ? ?Result Date: 12/22/2021 ?CLINICAL DATA:  Shortness of breath EXAM: CHEST - 2 VIEW COMPARISON:  Previous studies including the examination of 11/18/2021 FINDINGS: Transverse diameter of heart is increased. Central pulmonary vessels are more prominent. There is interval increase in interstitial markings in the parahilar regions and lower lung fields. There is minimal blunting of lateral CP angles. There is no pneumothorax. There is linear  lucency in the right lower lung fields which may suggest pneumoperitoneum, possibly related to peritoneal dialysis or small loculated right pneumothorax. There is no demonstrable apical pneumothorax. IMPRESSION: There is interval worsen

## 2021-12-25 ENCOUNTER — Ambulatory Visit (INDEPENDENT_AMBULATORY_CARE_PROVIDER_SITE_OTHER): Payer: Medicare HMO

## 2021-12-25 ENCOUNTER — Telehealth: Payer: Self-pay | Admitting: *Deleted

## 2021-12-25 ENCOUNTER — Ambulatory Visit: Payer: Medicare HMO | Admitting: Gastroenterology

## 2021-12-25 DIAGNOSIS — N186 End stage renal disease: Secondary | ICD-10-CM | POA: Diagnosis not present

## 2021-12-25 DIAGNOSIS — Z992 Dependence on renal dialysis: Secondary | ICD-10-CM | POA: Diagnosis not present

## 2021-12-25 DIAGNOSIS — I5022 Chronic systolic (congestive) heart failure: Secondary | ICD-10-CM

## 2021-12-25 NOTE — Telephone Encounter (Signed)
Transition Care Management Follow-up Telephone Call ?Date of discharge and from where: [facility] 12-24-2021 ?How have you been since you were released from the hospital? Patient wife states he says fine.  Unable to speak with patient ?Any questions or concerns? No ? ?Items Reviewed: ?Did the pt receive and understand the discharge instructions provided? Yes  ?Medications obtained and verified? Yes  ?Other? No  ?Any new allergies since your discharge? No  ?Dietary orders reviewed? No ?Do you have support at home? Yes  ? ?Home Care and Equipment/Supplies: ?Were home health services ordered?  ?If so, what is the name of the agency?   ?Has the agency set up a time to come to the patient's home?  ?Were any new equipment or medical supplies ordered?   ?What is the name of the medical supply agency?  ?Were you able to get the supplies/equipment?  ?Do you have any questions related to the use of the equipment or supplies?  ?  ?Functional Questionnaire: (I = Independent and D = Dependent) ?ADLs: i ? ?Bathing/Dressing- i ? ?Meal Prep- i ? ?Eating- i ? ?Maintaining continence- d  ? ?Transferring/Ambulation- i ? ?Managing Meds- i ? ?Follow up appointments reviewed: ? ?PCP Hospital f/u appt confirmed? Yes  Scheduled to see 4-11 on 10:00 @ [facility]. ?Day Hospital f/u appt confirmed? No  Scheduled to see  on  @ . ?Are transportation arrangements needed? no ?If their condition worsens, is the pt aware to call PCP or go to the Emergency Dept.? Yes ?Was the patient provided with contact information for the PCP's office or ED? Yes ?Was to pt encouraged to call back with questions or concerns? Yes  ?

## 2021-12-26 DIAGNOSIS — Z992 Dependence on renal dialysis: Secondary | ICD-10-CM | POA: Diagnosis not present

## 2021-12-26 DIAGNOSIS — N186 End stage renal disease: Secondary | ICD-10-CM | POA: Diagnosis not present

## 2021-12-26 LAB — ECHOCARDIOGRAM COMPLETE
AR max vel: 1.33 cm2
AV Area VTI: 1.28 cm2
AV Area mean vel: 1.29 cm2
AV Mean grad: 20.7 mmHg
AV Peak grad: 31.7 mmHg
AV Vena cont: 0.3 cm
Ao pk vel: 2.81 m/s
Area-P 1/2: 4.15 cm2
Calc EF: 32 %
MV M vel: 5.44 m/s
MV Peak grad: 118.2 mmHg
P 1/2 time: 319 msec
Radius: 0.5 cm
S' Lateral: 5.1 cm
Single Plane A2C EF: 35.5 %
Single Plane A4C EF: 30.1 %

## 2021-12-27 DIAGNOSIS — Z992 Dependence on renal dialysis: Secondary | ICD-10-CM | POA: Diagnosis not present

## 2021-12-27 DIAGNOSIS — N186 End stage renal disease: Secondary | ICD-10-CM | POA: Diagnosis not present

## 2021-12-29 DIAGNOSIS — Z992 Dependence on renal dialysis: Secondary | ICD-10-CM | POA: Diagnosis not present

## 2021-12-29 DIAGNOSIS — N186 End stage renal disease: Secondary | ICD-10-CM | POA: Diagnosis not present

## 2021-12-30 DIAGNOSIS — Z992 Dependence on renal dialysis: Secondary | ICD-10-CM | POA: Diagnosis not present

## 2021-12-30 DIAGNOSIS — N186 End stage renal disease: Secondary | ICD-10-CM | POA: Diagnosis not present

## 2021-12-31 ENCOUNTER — Encounter: Payer: Self-pay | Admitting: Family Medicine

## 2021-12-31 ENCOUNTER — Ambulatory Visit (INDEPENDENT_AMBULATORY_CARE_PROVIDER_SITE_OTHER): Payer: Medicare HMO | Admitting: Family Medicine

## 2021-12-31 ENCOUNTER — Telehealth: Payer: Self-pay

## 2021-12-31 ENCOUNTER — Telehealth: Payer: Self-pay | Admitting: Family Medicine

## 2021-12-31 VITALS — BP 110/70 | HR 87 | Temp 98.3°F | Wt 152.8 lb

## 2021-12-31 DIAGNOSIS — D696 Thrombocytopenia, unspecified: Secondary | ICD-10-CM

## 2021-12-31 DIAGNOSIS — D7581 Myelofibrosis: Secondary | ICD-10-CM

## 2021-12-31 DIAGNOSIS — S51802A Unspecified open wound of left forearm, initial encounter: Secondary | ICD-10-CM

## 2021-12-31 DIAGNOSIS — N186 End stage renal disease: Secondary | ICD-10-CM

## 2021-12-31 DIAGNOSIS — I5043 Acute on chronic combined systolic (congestive) and diastolic (congestive) heart failure: Secondary | ICD-10-CM | POA: Diagnosis not present

## 2021-12-31 DIAGNOSIS — I700 Atherosclerosis of aorta: Secondary | ICD-10-CM | POA: Diagnosis not present

## 2021-12-31 DIAGNOSIS — I10 Essential (primary) hypertension: Secondary | ICD-10-CM | POA: Diagnosis not present

## 2021-12-31 DIAGNOSIS — I25118 Atherosclerotic heart disease of native coronary artery with other forms of angina pectoris: Secondary | ICD-10-CM

## 2021-12-31 DIAGNOSIS — I350 Nonrheumatic aortic (valve) stenosis: Secondary | ICD-10-CM

## 2021-12-31 DIAGNOSIS — J449 Chronic obstructive pulmonary disease, unspecified: Secondary | ICD-10-CM | POA: Diagnosis not present

## 2021-12-31 DIAGNOSIS — D638 Anemia in other chronic diseases classified elsewhere: Secondary | ICD-10-CM

## 2021-12-31 DIAGNOSIS — E875 Hyperkalemia: Secondary | ICD-10-CM

## 2021-12-31 DIAGNOSIS — Z23 Encounter for immunization: Secondary | ICD-10-CM

## 2021-12-31 DIAGNOSIS — Z992 Dependence on renal dialysis: Secondary | ICD-10-CM | POA: Diagnosis not present

## 2021-12-31 DIAGNOSIS — R161 Splenomegaly, not elsewhere classified: Secondary | ICD-10-CM

## 2021-12-31 DIAGNOSIS — I132 Hypertensive heart and chronic kidney disease with heart failure and with stage 5 chronic kidney disease, or end stage renal disease: Secondary | ICD-10-CM

## 2021-12-31 DIAGNOSIS — R339 Retention of urine, unspecified: Secondary | ICD-10-CM

## 2021-12-31 NOTE — Assessment & Plan Note (Signed)
Continue to follow with nephrology. Continue dialysis. Call with any concerns. Continue to monitor.  ?

## 2021-12-31 NOTE — Assessment & Plan Note (Signed)
Euvolemic today. Not feeling well. Cannot start SGLT-2 due to kidney function. Continue lasix. Patient cancelled appointment with CHF clinic. Will get him back in with them. Call with any concerns.  ?

## 2021-12-31 NOTE — Assessment & Plan Note (Signed)
Rechecking labs today. Await results. Treat as needed.  °

## 2021-12-31 NOTE — Assessment & Plan Note (Signed)
Lungs clear today. Call with any concerns. Continue to monitor. Continue current regimen.  ?

## 2021-12-31 NOTE — Telephone Encounter (Signed)
Can you please let him know that Dr. Holley Raring doesn't think the medicine to help prevent the heart failure flares would help based on where his kidney function is, so I'm not going to send it in. Thanks! ?

## 2021-12-31 NOTE — Assessment & Plan Note (Signed)
Stable. BP running low normal. Continue to monitor. Call with any concerns.  ?

## 2021-12-31 NOTE — Progress Notes (Signed)
? ?BP 110/70   Pulse 87   Temp 98.3 ?F (36.8 ?C)   Wt 152 lb 12.8 oz (69.3 kg)   SpO2 99%   BMI 23.93 kg/m?   ? ?Subjective:  ? ? [name], male    DOB: [birth year], 66 y.o.   MRN: [id number] ? ?HPI: ?JAEDYN Arnold is a 66 y.o. male ? ?Chief Complaint  ?Patient presents with  ? Hospitalization Follow-up  ?  Patient states he was in hospital twice since he was last here for SOB. Patient states he has been feeling dizzy and weak.   ? ?Transition of Care Hospital Follow up.  ? ?Hospital/Facility: [facility] ?D/C Physician: Dr. Billie Ruddy ?D/C Date: 12/24/21 ? ?Records Requested: 12/31/21 ?Records Received: 12/31/21 ?Records Reviewed: 12/31/21 ? ?Diagnoses on Discharge: Acute on chronic combined systolic and diastolic CHF (congestive heart failure) ([place]) ?  CAD (coronary artery disease) ?  Myelofibrosis ([place]) ?  Hypertension ?  Depression ?  History of seizure ?  Hyperkalemia ?  ESRD on peritoneal dialysis [facility]) ?  Anemia of chronic disease ?  COPD (chronic obstructive pulmonary disease) ([place]) ?  Dyspnea ? ?Date of interactive Contact within 48 hours of discharge: 12/25/21 ?Contact was through: phone ? ?Date of 7 day or 14 day face-to-face visit:  12/31/21  within 7 days ? ?Outpatient Encounter Medications as of 12/31/2021  ?Medication Sig  ? acetaminophen (TYLENOL) 500 MG tablet Take 500 mg by mouth every 6 (six) hours as needed for headache, fever, moderate pain or mild pain.  ? albuterol (PROVENTIL) (2.5 MG/3ML) 0.083% nebulizer solution Take 3 mLs (2.5 mg total) by nebulization every 6 (six) hours as needed for wheezing or shortness of breath.  ? albuterol (VENTOLIN HFA) 108 (90 Base) MCG/ACT inhaler Inhale 2 puffs into the lungs every 6 (six) hours as needed for wheezing or shortness of breath.  ? aspirin 81 MG chewable tablet Chew 1 tablet (81 mg total) by mouth daily.  ? atorvastatin (LIPITOR) 40 MG tablet Take 1 tablet (40 mg total) by mouth daily.  ? carvedilol (COREG) 12.5 MG tablet Take 1 tablet (12.5 mg total)  by mouth 2 (two) times daily.  ? Cholecalciferol (VITAMIN D-1000 MAX ST) 25 MCG (1000 UT) tablet Take 1,000 Units by mouth daily.   ? citalopram (CELEXA) 40 MG tablet Take 1.5 tablets (60 mg total) by mouth daily.  ? clopidogrel (PLAVIX) 75 MG tablet Take 75 mg by mouth daily.  ? ferrous sulfate 325 (65 FE) MG tablet Take 1 tablet (325 mg total) by mouth daily with breakfast.  ? furosemide (LASIX) 80 MG tablet Take 1 tablet (80 mg total) by mouth daily. Increased from 20 mg daily.  ? gentamicin cream (GARAMYCIN) 0.1 % Apply 1 application topically every other day.  ? levETIRAcetam (KEPPRA) 500 MG tablet Take 1 tablet (500 mg total) by mouth at bedtime.  ? meclizine (ANTIVERT) 12.5 MG tablet Take 1 tablet (12.5 mg total) by mouth 2 (two) times daily as needed for dizziness.  ? nitroGLYCERIN (NITROSTAT) 0.4 MG SL tablet Place 1 tablet (0.4 mg total) under the tongue every 5 (five) minutes as needed for chest pain.  ? nortriptyline (PAMELOR) 25 MG capsule Take 1 capsule (25 mg total) by mouth at bedtime.  ? ondansetron (ZOFRAN ODT) 4 MG disintegrating tablet Take 1 tablet (4 mg total) by mouth every 8 (eight) hours as needed for nausea or vomiting.  ? pantoprazole (PROTONIX) 20 MG tablet Take 20 mg by mouth daily.  ?  polyethylene glycol (MIRALAX / GLYCOLAX) 17 g packet Take 17 g by mouth daily as needed.  ? predniSONE (DELTASONE) 10 MG tablet Take 10 mg by mouth daily with breakfast.  ? tamsulosin (FLOMAX) 0.4 MG CAPS capsule Take 2 capsules (0.8 mg total) by mouth daily.  ? multivitamin (RENA-VIT) TABS tablet Take 1 tablet by mouth at bedtime. (Patient not taking: Reported on 12/31/2021)  ? [DISCONTINUED] Nutritional Supplements (FEEDING SUPPLEMENT, NEPRO CARB STEADY,) LIQD Take 237 mLs by mouth 2 (two) times daily between meals. (Patient not taking: Reported on 12/31/2021)  ? ?No facility-administered encounter medications on file as of 12/31/2021.  ?Per Hospitalist: "Hospital Course:  ?For full details, please see H&P,  progress notes, consult notes and ancillary notes.  ?Briefly,  ?James Arnold is a 66 y.o. male with medical history significant for end-stage renal disease on peritoneal dialysis, coronary artery disease status post recent stent angioplasty, COPD, hypertension, myelofibrosis who presented to the ER via EMS for evaluation of sudden onset shortness of breath. ?  ?* Acute on chronic combined systolic and diastolic CHF (congestive heart failure) ([place]) ?--chest x-ray showed interval worsening of pulmonary vascular congestion, Interval increase in interstitial markings in both lungs suggest pulmonary edema. Small bilateral pleural effusions. ?--BNP 1500's. ?--LVEF 35 to 40% with grade II DD back in Dec 2022. ?--started on IV lasix and pt did have increased urine output in response. ?--Continue carvedilol ?--Patient not on an ACE or ARB due to hyperkalemia ?--Pt was discharged on oral lasix 80 mg daily (increased from 20 mg), per nephro rec. ?  ?COPD (chronic obstructive pulmonary disease) ([place]) ?Stable and not acutely exacerbated ?  ?Anemia of chronic disease ?Hgb stable ~8's. ?  ?ESRD on peritoneal dialysis ([place]) ?--iPD per nephro ?  ?Hyperkalemia, resolved ?Most likely related to known history of end-stage renal disease ?  ?History of seizure ?Continue Keppra 500 mg twice daily ?  ?Depression ?Stable ?Continue Celexa and nortriptyline ?  ?Hypertension ?Blood pressure is stable ?Continue carvedilol ?--Pt was discharged on oral lasix 80 mg daily (increased from 20 mg), per nephro rec. ?  ?Myelofibrosis ([place]) ?Patient has a history of myelofibrosis and follows up with oncology as an outpatient. ?  ?CAD (coronary artery disease) ?He had a cardiac cath done in 12/22 which showed severe three-vessel disease. ?--cont ASA and plavix ?--cont statin" ? ?Diagnostic Tests Reviewed:  ?CLINICAL DATA:  Shortness of breath ?  ?EXAM: ?CHEST - 2 VIEW ?  ?COMPARISON:  Previous studies including the examination of ?11/18/2021 ?   ?FINDINGS: ?Transverse diameter of heart is increased. Central pulmonary vessels ?are more prominent. There is interval increase in interstitial ?markings in the parahilar regions and lower lung fields. There is ?minimal blunting of lateral CP angles. There is no pneumothorax. ?There is linear lucency in the right lower lung fields which may ?suggest pneumoperitoneum, possibly related to peritoneal dialysis or ?small loculated right pneumothorax. There is no demonstrable apical ?pneumothorax. ?  ?IMPRESSION: ?There is interval worsening of pulmonary vascular congestion ?suggesting CHF. Interval increase in interstitial markings in both ?lungs suggest pulmonary edema. Small bilateral pleural effusions. ?  ?Faint linear lucency is seen in the right lower lung fields which ?may suggest minimal pneumoperitoneum related to peritoneal dialysis ?or suggest small loculated right pneumothorax. There is no evidence ?of right apical pneumothorax. ? ?CLINICAL DATA:  Evaluate for pneumonia.  Respiratory distress. ?  ?EXAM: ?CT CHEST WITHOUT CONTRAST ?  ?TECHNIQUE: ?Multidetector CT imaging of the chest was performed following the ?standard protocol without  IV contrast. ?  ?RADIATION DOSE REDUCTION: This exam was performed according to the ?departmental dose-optimization program which includes automated ?exposure control, adjustment of the mA and/or kV according to ?patient size and/or use of iterative reconstruction technique. ?  ?COMPARISON:  CT chest 01/18/2020 ?  ?FINDINGS: ?Cardiovascular: There is mild cardiac enlargement. Aortic ?atherosclerosis and 3 vessel coronary artery calcifications noted. ?Trace pericardial effusion is noted. ?  ?Mediastinum/Nodes: Normal appearance of the thyroid gland. The ?trachea appears patent and is midline. Normal appearance of the ?esophagus. No enlarged axillary, supraclavicular, or mediastinal ?lymph nodes. Hilar lymph nodes are suboptimally evaluated due to ?lack of IV contrast. ?   ?Lungs/Pleura: Small bilateral pleural effusions are identified. ?There is diffuse interlobular septal thickening and ground-glass ?attenuation within both lungs, this is most severe within the lower ?lung zones. Patchy

## 2021-12-31 NOTE — Telephone Encounter (Signed)
Patient wife is aware, no further questions.  ?

## 2021-12-31 NOTE — Assessment & Plan Note (Signed)
Will keep BP and cholesterol under good control. Continue to monitor. Call with any concerns.  

## 2021-12-31 NOTE — Assessment & Plan Note (Signed)
Will get him into urology for PVR. Currently already on 0.'8mg'$  flomax without any benefit. Await their input.  ?

## 2021-12-31 NOTE — Assessment & Plan Note (Signed)
Following with cardiology. Continue current regimen for now. Call with any concerns.  ?

## 2021-12-31 NOTE — Assessment & Plan Note (Signed)
Continue to follow with hematology. Call with any concerns. Continue to monitor.  ?

## 2021-12-31 NOTE — Assessment & Plan Note (Signed)
Checking labs today. Await results. Treat as needed.  

## 2021-12-31 NOTE — Telephone Encounter (Signed)
Attempted to reach patient to reschedule canceled appointment. Left voicemail for patient.  ?Georg Ruddle, RN ?

## 2021-12-31 NOTE — Assessment & Plan Note (Signed)
To see cardiology in about 3 weeks. Await their input. Call with any concerns.  ?

## 2022-01-01 ENCOUNTER — Ambulatory Visit: Payer: Medicare HMO | Admitting: Family

## 2022-01-01 ENCOUNTER — Other Ambulatory Visit: Payer: Self-pay | Admitting: Family Medicine

## 2022-01-01 ENCOUNTER — Ambulatory Visit: Payer: Medicare HMO | Admitting: Internal Medicine

## 2022-01-01 DIAGNOSIS — N186 End stage renal disease: Secondary | ICD-10-CM | POA: Diagnosis not present

## 2022-01-01 DIAGNOSIS — Z992 Dependence on renal dialysis: Secondary | ICD-10-CM | POA: Diagnosis not present

## 2022-01-01 DIAGNOSIS — I259 Chronic ischemic heart disease, unspecified: Secondary | ICD-10-CM | POA: Diagnosis not present

## 2022-01-01 LAB — BASIC METABOLIC PANEL
BUN/Creatinine Ratio: 9 — ABNORMAL LOW (ref 10–24)
BUN: 72 mg/dL — ABNORMAL HIGH (ref 8–27)
CO2: 20 mmol/L (ref 20–29)
Calcium: 7.5 mg/dL — ABNORMAL LOW (ref 8.6–10.2)
Chloride: 98 mmol/L (ref 96–106)
Creatinine, Ser: 7.85 mg/dL — ABNORMAL HIGH (ref 0.76–1.27)
Glucose: 88 mg/dL (ref 70–99)
Potassium: 6.5 mmol/L — ABNORMAL HIGH (ref 3.5–5.2)
Sodium: 141 mmol/L (ref 134–144)
eGFR: 7 mL/min/{1.73_m2} — ABNORMAL LOW (ref >59)

## 2022-01-01 LAB — CBC WITH DIFFERENTIAL/PLATELET
Basophils Absolute: 0 10*3/uL (ref 0.0–0.2)
Basos: 0 %
EOS (ABSOLUTE): 0 10*3/uL (ref 0.0–0.4)
Eos: 0 %
Hematocrit: 27.2 % — ABNORMAL LOW (ref 37.5–51.0)
Hemoglobin: 8.8 g/dL — ABNORMAL LOW (ref 13.0–17.7)
Immature Grans (Abs): 0 10*3/uL (ref 0.0–0.1)
Immature Granulocytes: 1 %
Lymphocytes Absolute: 0.3 10*3/uL — ABNORMAL LOW (ref 0.7–3.1)
Lymphs: 4 %
MCH: 29.2 pg (ref 26.6–33.0)
MCHC: 32.4 g/dL (ref 31.5–35.7)
MCV: 90 fL (ref 79–97)
Monocytes Absolute: 0.3 10*3/uL (ref 0.1–0.9)
Monocytes: 5 %
Neutrophils Absolute: 5.9 10*3/uL (ref 1.4–7.0)
Neutrophils: 90 %
Platelets: 155 10*3/uL (ref 150–450)
RBC: 3.01 x10E6/uL — ABNORMAL LOW (ref 4.14–5.80)
RDW: 15.2 % (ref 11.6–15.4)
WBC: 6.5 10*3/uL (ref 3.4–10.8)

## 2022-01-01 NOTE — Telephone Encounter (Signed)
Medication Refill - Medication: tamsulosin (FLOMAX) 0.4 MG CAPS capsule ? ?Pt wife stated medication was sent to [facility] and they stated they cannot fill until 01/19/2022. However, pt only has about 3 days? worth of medication left. Pt wife is requesting 30 tablets while they receive the medication. Pt wife said she feels they may have missed counted medication because he doesn?t have enough left. ? ?Has the patient contacted their pharmacy? Yes.   ? ?(Agent: If yes, when and what did the pharmacy advise?) ? ?Preferred Pharmacy (with phone number or street name):  ?[place], [place] - [place]  ?[address] [place] [address]  ?Phone: [phone] Fax: [phone]  ?Hours: Not open 24 hours  ? ?Has the patient been seen for an appointment in the last year OR does the patient have an upcoming appointment? Yes.   ? ?Agent: Please be advised that RX refills may take up to 3 business days. We ask that you follow-up with your pharmacy.  ?

## 2022-01-02 DIAGNOSIS — N186 End stage renal disease: Secondary | ICD-10-CM | POA: Diagnosis not present

## 2022-01-02 DIAGNOSIS — Z992 Dependence on renal dialysis: Secondary | ICD-10-CM | POA: Diagnosis not present

## 2022-01-02 MED ORDER — TAMSULOSIN HCL 0.4 MG PO CAPS: 0.8000 mg | ORAL_CAPSULE | Freq: Every day | ORAL | 3 refills | Status: AC

## 2022-01-02 NOTE — Telephone Encounter (Signed)
Requested Prescriptions  ?Pending Prescriptions Disp Refills  ?? tamsulosin (FLOMAX) 0.4 MG CAPS capsule 60 capsule 3  ?  Sig: Take 2 capsules (0.8 mg total) by mouth daily.  ?  ? Urology: Alpha-Adrenergic Blocker Passed - 01/01/2022  2:45 PM  ?  ?  Passed - PSA in normal range and within 360 days  ?  PSA  ?Date Value Ref Range Status  ?07/26/2014 0.3  Final  ? ?Prostate Specific Ag, Serum  ?Date Value Ref Range Status  ?10/16/2021 0.3 0.0 - 4.0 ng/mL Final  ?  Comment:  ?  Roche ECLIA methodology. ?According to the American Urological Association, Serum PSA should ?decrease and remain at undetectable levels after radical ?prostatectomy. The AUA defines biochemical recurrence as an initial ?PSA value 0.2 ng/mL or greater followed by a subsequent confirmatory ?PSA value 0.2 ng/mL or greater. ?Values obtained with different assay methods or kits cannot be used ?interchangeably. Results cannot be interpreted as absolute evidence ?of the presence or absence of malignant disease. ?  ?   ?  ?  Passed - Last BP in normal range  ?  BP Readings from Last 1 Encounters:  ?12/31/21 110/70  ?   ?  ?  Passed - Valid encounter within last 12 months  ?  Recent Outpatient Visits   ?      ? 2 days ago Acute on chronic combined systolic and diastolic CHF (congestive heart failure) ([place])  ? [place], Megan P, DO  ? 1 month ago SOB (shortness of breath)  ? [place] P, DO  ? 2 months ago Cellulitis, unspecified cellulitis site  ? [place], NP  ? 2 months ago Cellulitis, unspecified cellulitis site  ? [place], NP  ? 2 months ago Bleeding  ? [facility] Jon Billings, NP  ?  ?  ?Future Appointments   ?        ? In 6 days Stoioff, Ronda Fairly, MD [place]  ? In 3 weeks End, Harrell Gave, MD [facility]  ? In 4 weeks Wynetta Emery, Barb Merino, DO [place]  ?  ? ?  ?  ?  ? ?

## 2022-01-03 ENCOUNTER — Other Ambulatory Visit: Payer: Self-pay | Admitting: Family Medicine

## 2022-01-03 ENCOUNTER — Other Ambulatory Visit: Payer: Self-pay

## 2022-01-03 DIAGNOSIS — N186 End stage renal disease: Secondary | ICD-10-CM | POA: Diagnosis not present

## 2022-01-03 DIAGNOSIS — E875 Hyperkalemia: Secondary | ICD-10-CM

## 2022-01-03 DIAGNOSIS — Z992 Dependence on renal dialysis: Secondary | ICD-10-CM | POA: Diagnosis not present

## 2022-01-03 DIAGNOSIS — I10 Essential (primary) hypertension: Secondary | ICD-10-CM

## 2022-01-03 MED ORDER — SODIUM POLYSTYRENE SULFONATE PO POWD: Freq: Once | ORAL | 0 refills | Status: DC

## 2022-01-03 MED ORDER — SODIUM POLYSTYRENE SULFONATE PO POWD: Freq: Once | ORAL | 0 refills | Status: AC

## 2022-01-03 NOTE — Telephone Encounter (Signed)
Please specify directions per [facility] pharmacy  ?

## 2022-01-05 DIAGNOSIS — Z992 Dependence on renal dialysis: Secondary | ICD-10-CM | POA: Diagnosis not present

## 2022-01-05 DIAGNOSIS — N186 End stage renal disease: Secondary | ICD-10-CM | POA: Diagnosis not present

## 2022-01-05 IMAGING — RF DG ESOPHAGUS
3 series · 12 of 12 positions shown · non-contrast
Comparison: None.

CLINICAL DATA: Trouble swallowing.  Food getting stuck in throat.

EXAM:
ESOPHOGRAM/BARIUM SWALLOW
TECHNIQUE: Single contrast examination was performed using  thin barium.
FLUOROSCOPY TIME:  Fluoroscopy Time:  1 minutes and 18 seconds.
Radiation Exposure Index (if provided by the fluoroscopic device):
26.5 mGy
Number of Acquired Spot Images:

[Series 1: fluoro_barium 2fps_bw · 0.17mm/px · 4 of 27 frames shown]
[frame 5/27]
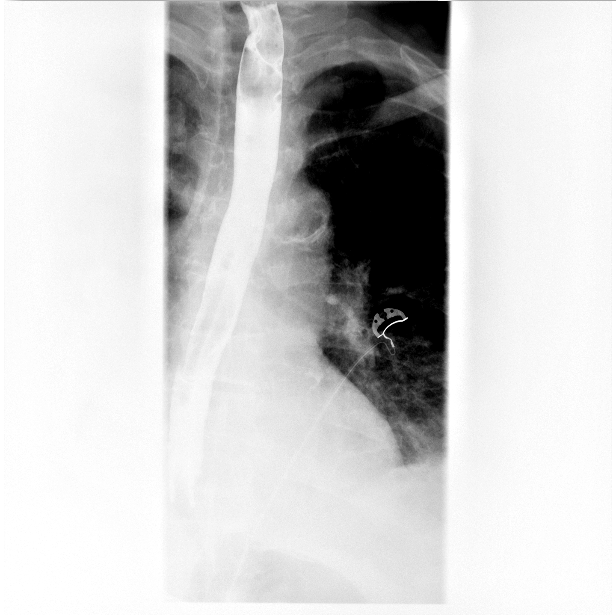
[frame 14/27]
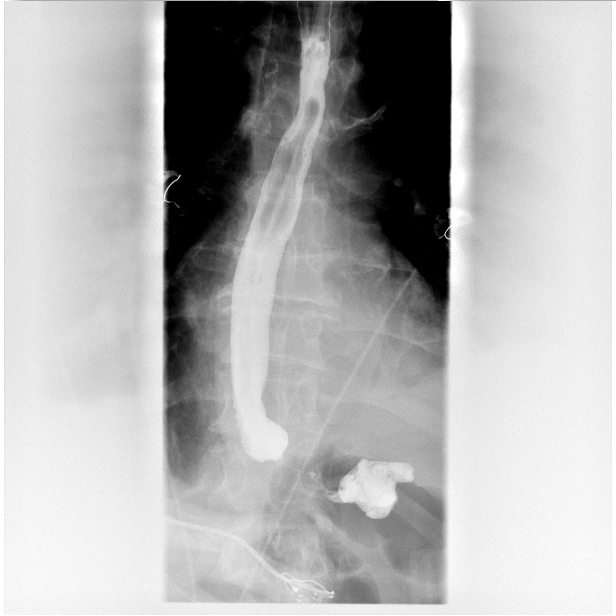
[frame 17/27]
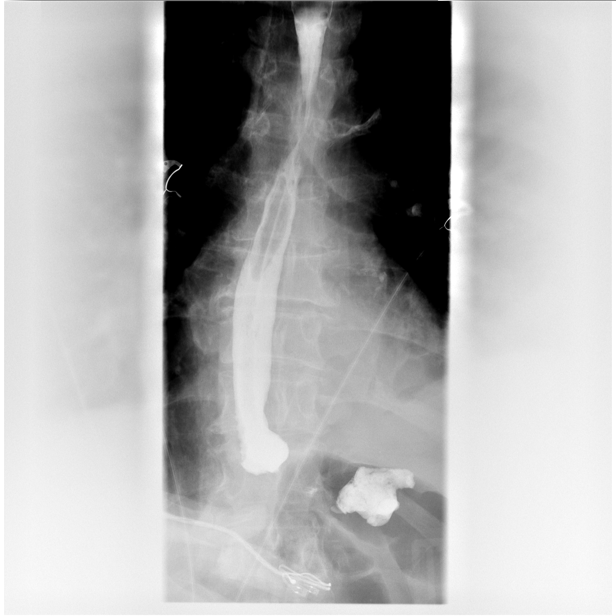
[frame 23/27]
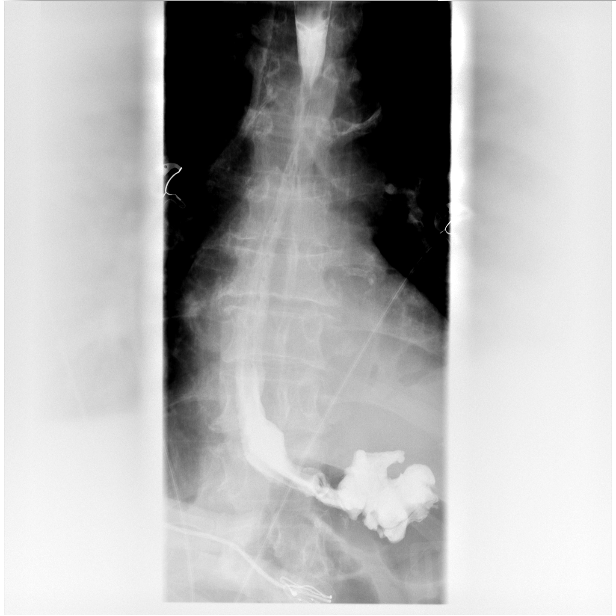

[Series 2: cp_standard · 0.26mm/px · 4 of 20 frames shown (1 of 2)]
[frame 4/20]
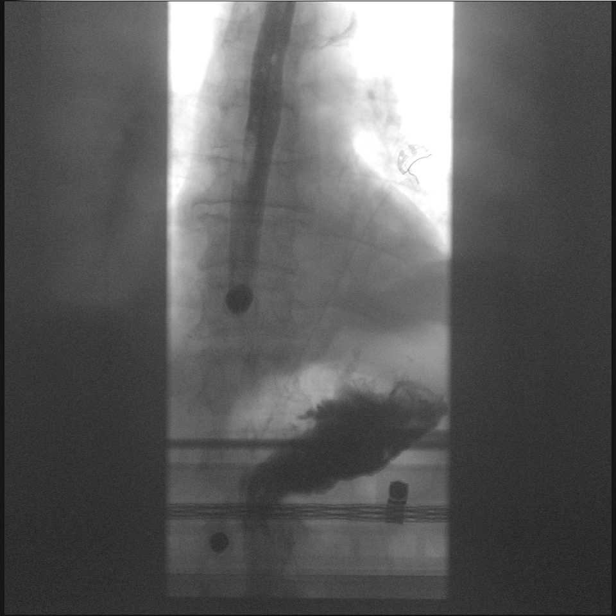
[frame 6/20]
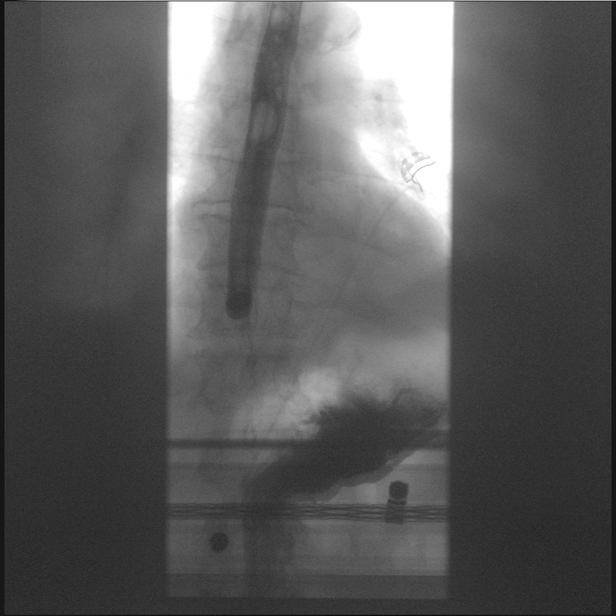
[frame 11/20]
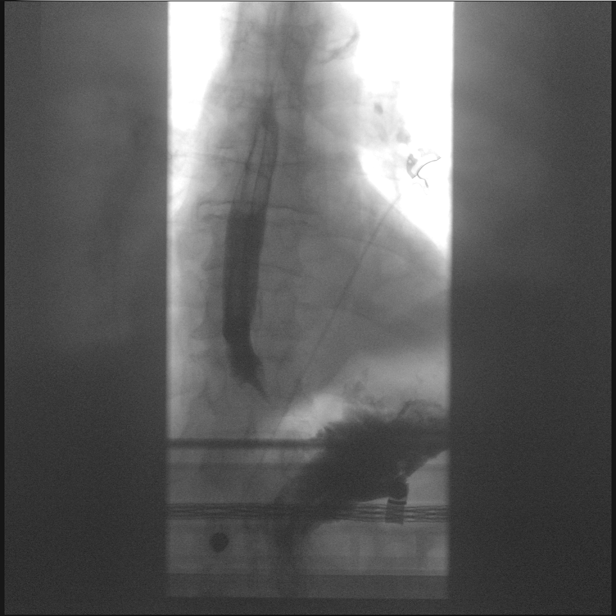
[frame 18/20]
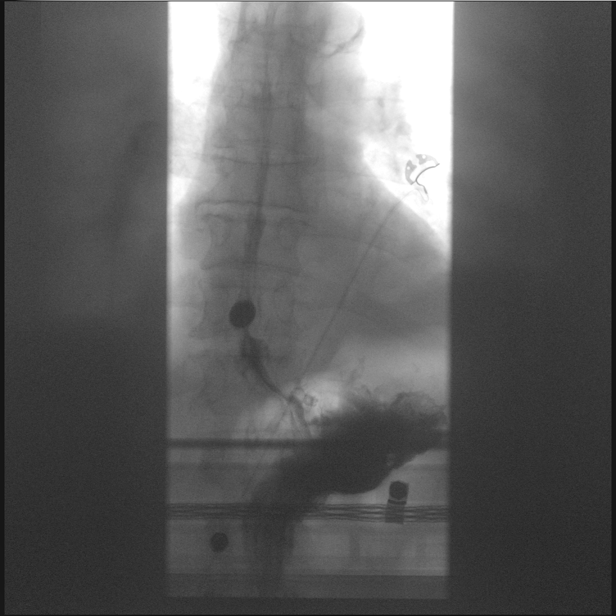

[Series 3: cp_standard · 0.26mm/px · 4 of 23 frames shown (2 of 2)]
[frame 3/23]
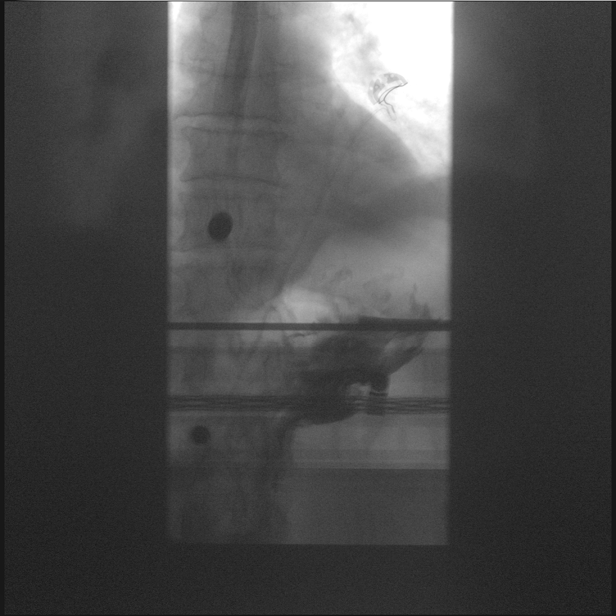
[frame 4/23]
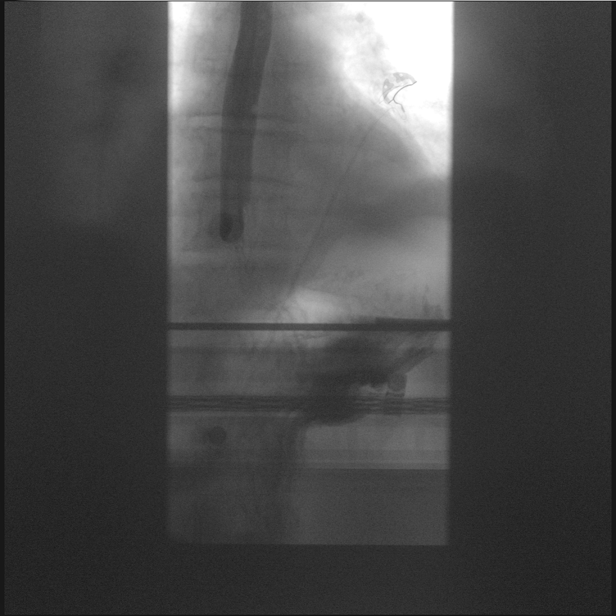
[frame 12/23]
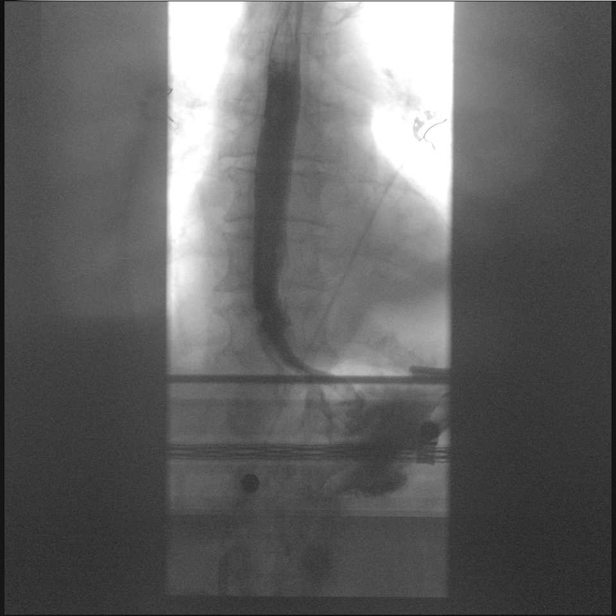
[frame 20/23]
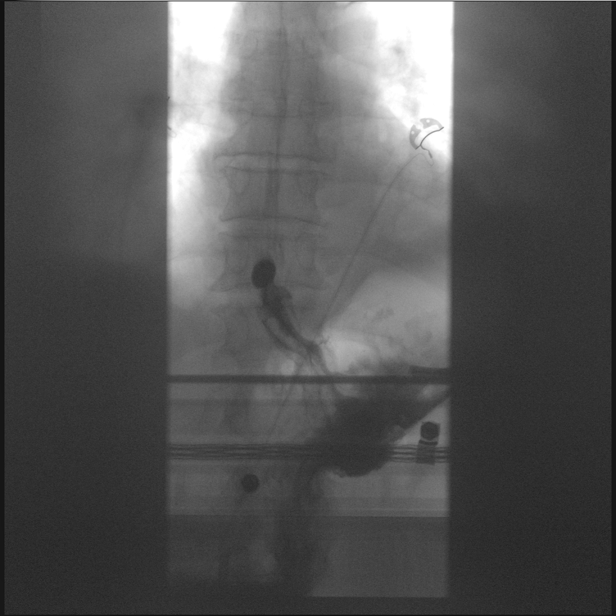

[12 of 12 positions shown; findings below may reference images not displayed]

FINDINGS: Single contrast evaluation of the esophagus with the patient in a 45
degree head up position shows no gross mass lesion, diverticulum, or
mucosal ulceration. There is a tiny hiatal hernia. Subtle narrowing
is seen in the distal esophagus just proximal to the esophagogastric
junction. There is some wall irregularity in the same region of the
distal esophagus.

13 mm barium tablet becomes lodged in the distal esophagus despite
repeated swallows of thin barium and water.
IMPRESSION: 1. Limited study due to recent shoulder surgery and patient
immobility.
2. Subtle distal esophageal narrowing with mucosal irregularity.
3. 13 mm barium tablet becomes lodged in the distal esophagus just
proximal to the esophagogastric junction. Given associated mucosal
irregularity in this region, upper endoscopy recommended to further
evaluate.
4. Tiny hiatal hernia.

## 2022-01-06 DIAGNOSIS — Z992 Dependence on renal dialysis: Secondary | ICD-10-CM | POA: Diagnosis not present

## 2022-01-06 DIAGNOSIS — N186 End stage renal disease: Secondary | ICD-10-CM | POA: Diagnosis not present

## 2022-01-07 DIAGNOSIS — Z992 Dependence on renal dialysis: Secondary | ICD-10-CM | POA: Diagnosis not present

## 2022-01-07 DIAGNOSIS — N186 End stage renal disease: Secondary | ICD-10-CM | POA: Diagnosis not present

## 2022-01-08 ENCOUNTER — Ambulatory Visit (INDEPENDENT_AMBULATORY_CARE_PROVIDER_SITE_OTHER): Payer: Medicare HMO | Admitting: Urology

## 2022-01-08 ENCOUNTER — Encounter: Payer: Self-pay | Admitting: Urology

## 2022-01-08 ENCOUNTER — Encounter: Payer: Self-pay | Admitting: Internal Medicine

## 2022-01-08 VITALS — BP 122/73 | HR 90 | Ht 67.0 in | Wt 143.0 lb

## 2022-01-08 DIAGNOSIS — N186 End stage renal disease: Secondary | ICD-10-CM | POA: Diagnosis not present

## 2022-01-08 DIAGNOSIS — R339 Retention of urine, unspecified: Secondary | ICD-10-CM

## 2022-01-08 DIAGNOSIS — N401 Enlarged prostate with lower urinary tract symptoms: Secondary | ICD-10-CM | POA: Diagnosis not present

## 2022-01-08 DIAGNOSIS — Z992 Dependence on renal dialysis: Secondary | ICD-10-CM | POA: Diagnosis not present

## 2022-01-08 LAB — URINALYSIS, COMPLETE
Bilirubin, UA: NEGATIVE
Glucose, UA: NEGATIVE
Ketones, UA: NEGATIVE
Leukocytes,UA: NEGATIVE
Nitrite, UA: NEGATIVE
RBC, UA: NEGATIVE
Specific Gravity, UA: 1.015 (ref 1.005–1.030)
Urobilinogen, Ur: 0.2 mg/dL (ref 0.2–1.0)
pH, UA: 7 (ref 5.0–7.5)

## 2022-01-08 LAB — BLADDER SCAN AMB NON-IMAGING: Scan Result: 18

## 2022-01-08 LAB — MICROSCOPIC EXAMINATION: Bacteria, UA: NONE SEEN

## 2022-01-08 MED ORDER — MIRABEGRON ER 25 MG PO TB24: 25.0000 mg | ORAL_TABLET | Freq: Every day | ORAL | 0 refills | Status: DC

## 2022-01-08 NOTE — Progress Notes (Signed)
? ?01/08/2022 ?9:49 AM  ? ?James Arnold ?26-Oct-1955 ?[id number] ? ?Referring provider: Valerie Roys, DO ?[place] ?[place],   [id number] ? ?Chief Complaint  ?Patient presents with  ? Urinary Retention  ? ? ?HPI: ?James Arnold is a 66 y.o.  male referred for evaluation of urinary retention. ? ?End stage renal disease on peritoneal dialysis ~ 2 years ?Does produce urine but recently has become oliguric ?Most bothersome symptoms include sensation of incomplete emptying, weak urinary stream and urgency ?IPSS today 22/35 ?On tamsulosin 0.8 mg daily ?Denies dysuria, gross hematuria ?No flank, abdominal or pelvic pain ?IPSS today 22/35 ? ? ?PMH: ?Past Medical History:  ?Diagnosis Date  ? Anemia   ? Benign hypertensive renal disease   ? CAD (coronary artery disease)   ? a. 08/1996 s/p BMS to the mLAD ([facility]); b. 12/2017 MV: EF 46%, --> low risk; c. 07/2020 MV: EF 51%, no ischemia/infarct; d. 08/2021 Cath/PCI: LM nl, LAD 60p, 95p/m ISR (3.0x48 Synergy DES), RI small, nl, LCX 40p, 95d(3.5x16 Synergy DES), OM1 50, RCA 40p/119m  ? Carotid arterial disease ([place]   ? a. 11/2020 Carotid U/S: <50% bilat ICA stenoses.  ? Chronic HFrEF (heart failure with reduced ejection fraction) ([place]   ? a. 08/2022 Echo: EF 35-40%.  ? CKD (chronic kidney disease), stage V ([place]   ? on peritoneal dialysis  ? Complication of anesthesia   ? please call by "[name]" when waking up!!  ? COPD (chronic obstructive pulmonary disease) ([place]   ? centrilobular emphysema. pt is poorly controlled with his copd/smoking.  ? Depression   ? Diastolic dysfunction   ? a. 12/2017 Echo: EF 60-65%, no rwma, Gr1 DD, mild AI/MR. Nl RVSP; b. 08/2020 Echo: EF 60-65%, no rwma, GrI DD, nl RV fxn. RVSP 32.575mg.  ? Dyspnea on exertion   ? with exertion  ? Fatigue   ? FSGS (focal segmental glomerulosclerosis)   ? Hyperlipidemia 10/2002  ? Hypertension   ? Ischemic cardiomyopathy   ? a. 08/2022 Echo: EF 35-40%, glob HK, GrII DD, nl RV fxn, mildly dil LA, mild MR, mild AS.  ?  myelofibrosis   ? bone marrow failure  ? My[place]ofibrosis ([place]  ? JAK2 (+) primary  ? [facility]  ? stent x 1  ? Pneumoperitoneum 02/2021  ? PSVT (paroxysmal supraventricular tachycardia) ([place]  ? a. 12/2020 Zio: Sinus rhythm 80 (55-117). Rare PACs/PVCs. 17 episodes of SVT (longest 20.3 secs, fastest 184). No triggered events. No afib.  ? Smoker   ? [facility]  ? a. 11/2020 MRI Brain: patchy acute/early subacute cortical infarcts w/in the R frontal, parietal, and occiptal lobes. Small, chronic L basal ganglia lacunar infarct.  ? Thrombocytopenia ([place]  ? ? ?Surgical History: ?Past Surgical History:  ?Procedure Laterality Date  ? ANGIOPLASTY    ? BONE MARROW ASPIRATION  03/2009  ? CARDIAC CATHETERIZATION  1997  ? stent x 1  ? COLONOSCOPY WITH PROPOFOL N/A 08/09/2018  ? Procedure: COLONOSCOPY WITH PROPOFOL;  Surgeon: AnJonathon BellowsMD;  [facility];  Service: Gastroenterology;  Laterality: N/A;  ? CORONARY ARTERY BYPASS GRAFT    ? CORONARY STENT PLACEMENT  08/1996  ? ESOPHAGOGASTRODUODENOSCOPY (EGD) WITH PROPOFOL N/A 07/25/2021  ? Procedure: ESOPHAGOGASTRODUODENOSCOPY (EGD) WITH PROPOFOL;  Surgeon: AnJonathon BellowsMD;  [facility];  Service: Gastroenterology;  Laterality: N/A;  ? EXTERIORIZATION OF A CONTINUOUS AMBULATORY PERITONEAL DIALYSIS CATHETER    ? EYE SURGERY Bilateral   ?  cats removed  ? LEFT HEART CATH AND CORONARY ANGIOGRAPHY N/A 09/12/2021  ? Procedure: LEFT HEART CATH AND CORONARY ANGIOGRAPHY;  Surgeon: Wellington Hampshire, MD;  Location: [place] [facility];  Service: Cardiovascular;  Laterality: N/A;  ? REVERSE SHOULDER ARTHROPLASTY Left 11/19/2020  ? Procedure: Left reverse shoulder arthroplasty;  Surgeon: Leim Fabry, MD;  Location: [facility] ORS;  Service: Orthopedics;  Laterality: Left;  ? ? ?Home Medications:  ?Allergies as of 01/08/2022   ?No Known Allergies ?  ? ?  ?Medication List  ?  ? ?  ? Accurate as of January 08, 2022  9:49 AM. If you have any  questions, ask your nurse or doctor.  ?  ?  ? ?  ? ?acetaminophen 500 MG tablet ?Commonly known as: TYLENOL ?Take 500 mg by mouth every 6 (six) hours as needed for headache, fever, moderate pain or mild pain. ?  ?albuterol 108 (90 Base) MCG/ACT inhaler ?Commonly known as: VENTOLIN HFA ?Inhale 2 puffs into the lungs every 6 (six) hours as needed for wheezing or shortness of breath. ?  ?albuterol (2.5 MG/3ML) 0.083% nebulizer solution ?Commonly known as: PROVENTIL ?Take 3 mLs (2.5 mg total) by nebulization every 6 (six) hours as needed for wheezing or shortness of breath. ?  ?aspirin 81 MG chewable tablet ?Chew 1 tablet (81 mg total) by mouth daily. ?  ?atorvastatin 40 MG tablet ?Commonly known as: LIPITOR ?Take 1 tablet (40 mg total) by mouth daily. ?  ?carvedilol 12.5 MG tablet ?Commonly known as: COREG ?Take 1 tablet (12.5 mg total) by mouth 2 (two) times daily. ?  ?citalopram 40 MG tablet ?Commonly known as: CELEXA ?Take 1.5 tablets (60 mg total) by mouth daily. ?  ?clopidogrel 75 MG tablet ?Commonly known as: PLAVIX ?Take 75 mg by mouth daily. ?  ?ferrous sulfate 325 (65 FE) MG tablet ?Take 1 tablet (325 mg total) by mouth daily with breakfast. ?  ?furosemide 80 MG tablet ?Commonly known as: LASIX ?Take 1 tablet (80 mg total) by mouth daily. Increased from 20 mg daily. ?  ?gentamicin cream 0.1 % ?Commonly known as: GARAMYCIN ?Apply 1 application topically every other day. ?  ?levETIRAcetam 500 MG tablet ?Commonly known as: KEPPRA ?Take 1 tablet (500 mg total) by mouth at bedtime. ?  ?meclizine 12.5 MG tablet ?Commonly known as: ANTIVERT ?Take 1 tablet (12.5 mg total) by mouth 2 (two) times daily as needed for dizziness. ?  ?multivitamin Tabs tablet ?Take 1 tablet by mouth at bedtime. ?  ?nitroGLYCERIN 0.4 MG SL tablet ?Commonly known as: NITROSTAT ?Place 1 tablet (0.4 mg total) under the tongue every 5 (five) minutes as needed for chest pain. ?  ?nortriptyline 25 MG capsule ?Commonly known as: Pamelor ?Take 1  capsule (25 mg total) by mouth at bedtime. ?  ?ondansetron 4 MG disintegrating tablet ?Commonly known as: Zofran ODT ?Take 1 tablet (4 mg total) by mouth every 8 (eight) hours as needed for nausea or vomiting. ?  ?pantoprazole 20 MG tablet ?Commonly known as: PROTONIX ?Take 20 mg by mouth daily. ?  ?polyethylene glycol 17 g packet ?Commonly known as: MIRALAX / GLYCOLAX ?Take 17 g by mouth daily as needed. ?  ?predniSONE 10 MG tablet ?Commonly known as: DELTASONE ?Take 10 mg by mouth daily with breakfast. ?  ?tamsulosin 0.4 MG Caps capsule ?Commonly known as: FLOMAX ?Take 2 capsules (0.8 mg total) by mouth daily. ?  ?Vitamin D-1000 Max St 25 MCG (1000 UT) tablet ?Generic drug: Cholecalciferol ?Take 1,000 Units by  mouth daily. ?  ? ?  ? ? ?Allergies: No Known Allergies ? ?Family History: ?Family History  ?Problem Relation Age of Onset  ? Stroke Mother   ?     Low BP stroke  ? Depression Father   ? Depression Sister   ?     Breast  ? Heart disease Other   ? Breast cancer Other   ? Lung cancer Other   ? Ovarian cancer Other   ? Stomach cancer Other   ? ? ?Social History:  reports that he quit smoking about 3 months ago. His smoking use included cigarettes. He has a 23.50 pack-year smoking history. He has never used smokeless tobacco. He reports current alcohol use. He reports that he does not use drugs. ? ? ?Physical Exam: ?BP 122/73   Pulse 90   Ht '5\' 7"'$  (8.185 m)   Wt 143 lb (64.9 kg)   BMI 22.40 kg/m?   ?Constitutional:  Alert and oriented, No acute distress. ?HEENT: [place] AT, moist mucus membranes.  Trachea midline, no masses. ?Cardiovascular: No clubbing, cyanosis, or edema. ?Respiratory: Normal respiratory effort, no increased work of breathing. ?GI: Abdomen is soft, nontender, nondistended, no abdominal masses ?GU: Prostate 45 g, smooth without nodules ?Psychiatric: Normal mood and affect. ? ?Laboratory Data: ? ?Urinalysis ?Dipstick 1+ protein ?Microscopy negative ? ? ?Assessment & Plan:   ? ?1.  BPH with lower  urinary tract symptoms ?Adequate bladder emptying with a PVR of 18 mL ?He is oliguric and symptoms more likely secondary to overactivity ?Trial Myrbetriq 25 mg daily-samples given ?Follow-up 1 month symptom reasse

## 2022-01-09 DIAGNOSIS — N186 End stage renal disease: Secondary | ICD-10-CM | POA: Diagnosis not present

## 2022-01-09 DIAGNOSIS — Z992 Dependence on renal dialysis: Secondary | ICD-10-CM | POA: Diagnosis not present

## 2022-01-10 ENCOUNTER — Other Ambulatory Visit: Payer: Medicare HMO

## 2022-01-10 DIAGNOSIS — E875 Hyperkalemia: Secondary | ICD-10-CM

## 2022-01-10 DIAGNOSIS — Z992 Dependence on renal dialysis: Secondary | ICD-10-CM | POA: Diagnosis not present

## 2022-01-10 DIAGNOSIS — M79676 Pain in unspecified toe(s): Secondary | ICD-10-CM | POA: Diagnosis not present

## 2022-01-10 DIAGNOSIS — N186 End stage renal disease: Secondary | ICD-10-CM | POA: Diagnosis not present

## 2022-01-11 LAB — BASIC METABOLIC PANEL
BUN/Creatinine Ratio: 9 — ABNORMAL LOW (ref 10–24)
BUN: 61 mg/dL — ABNORMAL HIGH (ref 8–27)
CO2: 22 mmol/L (ref 20–29)
Calcium: 7.2 mg/dL — ABNORMAL LOW (ref 8.6–10.2)
Chloride: 99 mmol/L (ref 96–106)
Creatinine, Ser: 7.15 mg/dL — ABNORMAL HIGH (ref 0.76–1.27)
Glucose: 96 mg/dL (ref 70–99)
Potassium: 4.6 mmol/L (ref 3.5–5.2)
Sodium: 142 mmol/L (ref 134–144)
eGFR: 8 mL/min/{1.73_m2} — ABNORMAL LOW (ref >59)

## 2022-01-11 LAB — URIC ACID: Uric Acid: 8.2 mg/dL (ref 3.8–8.4)

## 2022-01-12 DIAGNOSIS — Z992 Dependence on renal dialysis: Secondary | ICD-10-CM | POA: Diagnosis not present

## 2022-01-12 DIAGNOSIS — N186 End stage renal disease: Secondary | ICD-10-CM | POA: Diagnosis not present

## 2022-01-13 DIAGNOSIS — Z992 Dependence on renal dialysis: Secondary | ICD-10-CM | POA: Diagnosis not present

## 2022-01-13 DIAGNOSIS — N186 End stage renal disease: Secondary | ICD-10-CM | POA: Diagnosis not present

## 2022-01-14 DIAGNOSIS — Z992 Dependence on renal dialysis: Secondary | ICD-10-CM | POA: Diagnosis not present

## 2022-01-14 DIAGNOSIS — N186 End stage renal disease: Secondary | ICD-10-CM | POA: Diagnosis not present

## 2022-01-15 DIAGNOSIS — N186 End stage renal disease: Secondary | ICD-10-CM | POA: Diagnosis not present

## 2022-01-15 DIAGNOSIS — Z992 Dependence on renal dialysis: Secondary | ICD-10-CM | POA: Diagnosis not present

## 2022-01-16 DIAGNOSIS — M47816 Spondylosis without myelopathy or radiculopathy, lumbar region: Secondary | ICD-10-CM | POA: Diagnosis not present

## 2022-01-16 DIAGNOSIS — M545 Low back pain, unspecified: Secondary | ICD-10-CM | POA: Diagnosis not present

## 2022-01-16 DIAGNOSIS — G8929 Other chronic pain: Secondary | ICD-10-CM | POA: Diagnosis not present

## 2022-01-16 DIAGNOSIS — N186 End stage renal disease: Secondary | ICD-10-CM | POA: Diagnosis not present

## 2022-01-16 DIAGNOSIS — M4316 Spondylolisthesis, lumbar region: Secondary | ICD-10-CM | POA: Diagnosis not present

## 2022-01-16 DIAGNOSIS — Z992 Dependence on renal dialysis: Secondary | ICD-10-CM | POA: Diagnosis not present

## 2022-01-17 ENCOUNTER — Ambulatory Visit: Payer: Medicare HMO

## 2022-01-17 DIAGNOSIS — N186 End stage renal disease: Secondary | ICD-10-CM | POA: Diagnosis not present

## 2022-01-17 DIAGNOSIS — Z992 Dependence on renal dialysis: Secondary | ICD-10-CM | POA: Diagnosis not present

## 2022-01-17 NOTE — Progress Notes (Signed)
? [name], male    DOB: 10-25-1955, 66 y.o.   MRN: [id number] ? ?HPI ? ?James Arnold is a 66 y/o male with a history of myelofibrosis, carotid disease, CAD, hyperlipidemia, HTN, CKD (peritoneal dialysis), stroke, anemia, COPD, depression, FSGS, PSVT, thrombocytopenia, previous tobacco use and chronic heart failure.  ? ?Echo report from 12/25/21 reviewed and showed an EF of 30-35% along with severe LAE, moderate James, mild AR and moderate AS.  ? ?LHC done 09/12/21 and showed: ?1st Mrg lesion is 50% stenosed. ?  Dist Cx lesion is 95% stenosed. ?  Prox Cx to Mid Cx lesion is 40% stenosed. ?  Prox LAD to Mid LAD lesion is 95% stenosed. ?  Prox LAD lesion is 60% stenosed. ?  Prox RCA lesion is 40% stenosed. ?  Mid RCA lesion is 100% stenosed. ?  ?1.  Left dominant coronary arteries with severe three-vessel coronary artery disease.  The coronary arteries are heavily calcified especially the LAD. ?2.  Left ventricular angiography was not performed to preserve contrast given residual renal function.  EF was moderately reduced by echo. ?3.  Mildly elevated left ventricular end-diastolic pressure at 19 mmHg. ? ?Admitted 12/22/21 due to shortness of breath. Initially given IV lasix with transition to oral diuretics. No ACEi or ARB due to hyperkalemia. On peritoneal dialysis. Nephrology consult obtained. Discharged after 2 days.  ? ?He presents today for his initial visit with a chief complaint of moderate shortness of breath with minimal exertion. Describes this as chronic in nature. He has associated fatigue, intermittent chest pain, back pain (recent falls), intermittent dizziness and difficulty sleeping along with this. He denies any abdominal distention, palpitations, pedal edema, cough or weight gain.  ? ?Doing peritoneal dialysis nightly at home.  ? ?Fell ~ 1 month ago and was improving but then fell again a few days ago while taking the trash out. Back is now hurting quite a bit again. Pain of 8 out of 10.   ? ?Past Medical History:  ?Diagnosis Date  ? Anemia   ? Benign hypertensive renal disease   ? CAD (coronary artery disease)   ? a. 08/1996 s/p BMS to the mLAD ([facility]); b. 12/2017 MV: EF 46%, --> low risk; c. 07/2020 MV: EF 51%, no ischemia/infarct; d. 08/2021 Cath/PCI: LM nl, LAD 60p, 95p/m ISR (3.0x48 Synergy DES), RI small, nl, LCX 40p, 95d(3.5x16 Synergy DES), OM1 50, RCA 40p/143m  ? Carotid arterial disease ([place]   ? a. 11/2020 Carotid U/S: <50% bilat ICA stenoses.  ? CHF (congestive heart failure) ([place]   ? Chronic HFrEF (heart failure with reduced ejection fraction) ([place]   ? a. 08/2022 Echo: EF 35-40%.  ? CKD (chronic kidney disease), stage V ([place]   ? on peritoneal dialysis  ? Complication of anesthesia   ? please call by "[name]" when waking up!!  ? COPD (chronic obstructive pulmonary disease) ([place]   ? centrilobular emphysema. pt is poorly controlled with his copd/smoking.  ? Depression   ? Diastolic dysfunction   ? a. 12/2017 Echo: EF 60-65%, no rwma, Gr1 DD, mild AI/James. Nl RVSP; b. 08/2020 Echo: EF 60-65%, no rwma, GrI DD, nl RV fxn. RVSP 32.522mg.  ? Dyspnea on exertion   ? with exertion  ? Fatigue   ? FSGS (focal segmental glomerulosclerosis)   ? Hyperlipidemia 10/2002  ? Hypertension   ? Ischemic cardiomyopathy   ? a. 08/2022 Echo: EF 35-40%, glob HK, GrII DD, nl RV fxn, mildly dil LA, mild  James, mild AS.  ? myelofibrosis   ? bone marrow failure  ? Myelofibrosis ([place]) 2010  ? JAK2 (+) primary  ? Myocardial infarction [facility]) 1997  ? stent x 1  ? Pneumoperitoneum 02/2021  ? PSVT (paroxysmal supraventricular tachycardia) ([place])   ? a. 12/2020 Zio: Sinus rhythm 80 (55-117). Rare PACs/PVCs. 17 episodes of SVT (longest 20.3 secs, fastest 184). No triggered events. No afib.  ? Smoker   ? Stroke [facility])   ? a. 11/2020 MRI Brain: patchy acute/early subacute cortical infarcts w/in the R frontal, parietal, and occiptal lobes. Small, chronic L basal ganglia lacunar infarct.  ? Thrombocytopenia ([place])   ? ?Past Surgical  History:  ?Procedure Laterality Date  ? ANGIOPLASTY    ? BONE MARROW ASPIRATION  03/2009  ? CARDIAC CATHETERIZATION  1997  ? stent x 1  ? COLONOSCOPY WITH PROPOFOL N/A 08/09/2018  ? Procedure: COLONOSCOPY WITH PROPOFOL;  Surgeon: [name], MD;  Location: [facility] ENDOSCOPY;  Service: Gastroenterology;  Laterality: N/A;  ? CORONARY ARTERY BYPASS GRAFT    ? CORONARY STENT PLACEMENT  08/1996  ? ESOPHAGOGASTRODUODENOSCOPY (EGD) WITH PROPOFOL N/A 07/25/2021  ? Procedure: ESOPHAGOGASTRODUODENOSCOPY (EGD) WITH PROPOFOL;  Surgeon: [name], MD;  Location: [facility] ENDOSCOPY;  Service: Gastroenterology;  Laterality: N/A;  ? EXTERIORIZATION OF A CONTINUOUS AMBULATORY PERITONEAL DIALYSIS CATHETER    ? EYE SURGERY Bilateral   ? cats removed  ? LEFT HEART CATH AND CORONARY ANGIOGRAPHY N/A 09/12/2021  ? Procedure: LEFT HEART CATH AND CORONARY ANGIOGRAPHY;  Surgeon: Wellington Hampshire, MD;  Location: [place] CV LAB;  Service: Cardiovascular;  Laterality: N/A;  ? REVERSE SHOULDER ARTHROPLASTY Left 11/19/2020  ? Procedure: Left reverse shoulder arthroplasty;  Surgeon: Leim Fabry, MD;  Location: [facility] ORS;  Service: Orthopedics;  Laterality: Left;  ? ?Family History  ?Problem Relation Age of Onset  ? Stroke Mother   ?     Low BP stroke  ? Depression Father   ? Depression Sister   ?     Breast  ? Heart disease Other   ? Breast cancer Other   ? Lung cancer Other   ? Ovarian cancer Other   ? Stomach cancer Other   ? ?Social History  ? ?Tobacco Use  ? Smoking status: Former  ?  Packs/day: 0.50  ?  Years: 47.00  ?  Pack years: 23.50  ?  Types: Cigarettes  ?  Quit date: 09/11/2021  ?  Years since quitting: 0.3  ? Smokeless tobacco: Never  ? Tobacco comments:  ?  trying to cut back-smokes 6 to 7 cigs per day  ?Substance Use Topics  ? Alcohol use: Yes  ?  Comment: occasionally  ? ?No Known Allergies ?Prior to Admission medications   ?Medication Sig Start Date End Date Taking? Authorizing Provider  ?acetaminophen (TYLENOL) 500 MG tablet  Take 500 mg by mouth every 6 (six) hours as needed for headache, fever, moderate pain or mild pain.   Yes [provider]  ?albuterol (PROVENTIL) (2.5 MG/3ML) 0.083% nebulizer solution Take 3 mLs (2.5 mg total) by nebulization every 6 (six) hours as needed for wheezing or shortness of breath. 12/12/21  Yes Johnson, Megan P, DO  ?albuterol (VENTOLIN HFA) 108 (90 Base) MCG/ACT inhaler Inhale 2 puffs into the lungs every 6 (six) hours as needed for wheezing or shortness of breath. 02/27/21  Yes Borders, Kirt Boys, NP  ?allopurinol (ZYLOPRIM) 100 MG tablet Take 100 mg by mouth daily.   Yes [provider]  ?aspirin 81  MG chewable tablet Chew 1 tablet (81 mg total) by mouth daily. 01/08/21  Yes Johnson, Megan P, DO  ?atorvastatin (LIPITOR) 40 MG tablet Take 1 tablet (40 mg total) by mouth daily. 11/04/21 02/02/22 Yes Theora Gianotti, NP  ?carvedilol (COREG) 12.5 MG tablet Take 1 tablet (12.5 mg total) by mouth 2 (two) times daily. 10/10/21  Yes End, Harrell Gave, MD  ?Cholecalciferol (VITAMIN D-1000 MAX ST) 25 MCG (1000 UT) tablet Take 1,000 Units by mouth daily.    Yes [provider]  ?citalopram (CELEXA) 40 MG tablet Take 1.5 tablets (60 mg total) by mouth daily. 09/06/21  Yes Johnson, Megan P, DO  ?clopidogrel (PLAVIX) 75 MG tablet Take 75 mg by mouth daily. 09/20/21  Yes [provider]  ?ferrous sulfate 325 (65 FE) MG tablet Take 1 tablet (325 mg total) by mouth daily with breakfast. 12/30/17 08/06/23 Yes Pyreddy, Reatha Harps, MD  ?furosemide (LASIX) 80 MG tablet Take 1 tablet (80 mg total) by mouth daily. Increased from 20 mg daily. 12/24/21 03/24/22 Yes Enzo Bi, MD  ?gentamicin cream (GARAMYCIN) 0.1 % Apply 1 application topically every other day. 03/18/19  Yes [provider]  ?levETIRAcetam (KEPPRA) 500 MG tablet Take 1 tablet (500 mg total) by mouth at bedtime. 11/24/20  Yes Loletha Grayer, MD  ?meclizine (ANTIVERT) 12.5 MG tablet Take 1 tablet (12.5 mg total) by mouth 2  (two) times daily as needed for dizziness. 11/25/21  Yes Cammie Sickle, MD  ?mirabegron ER (MYRBETRIQ) 25 MG TB24 tablet Take 1 tablet (25 mg total) by mouth daily. 01/08/22  Yes Stoioff, Ronda Fairly, MD  ?nitroG

## 2022-01-19 DIAGNOSIS — Z992 Dependence on renal dialysis: Secondary | ICD-10-CM | POA: Diagnosis not present

## 2022-01-19 DIAGNOSIS — N186 End stage renal disease: Secondary | ICD-10-CM | POA: Diagnosis not present

## 2022-01-20 ENCOUNTER — Ambulatory Visit: Payer: Medicare HMO | Attending: Family | Admitting: Family

## 2022-01-20 ENCOUNTER — Encounter: Payer: Self-pay | Admitting: Family

## 2022-01-20 VITALS — BP 129/65 | HR 85 | Resp 20 | Ht 67.0 in | Wt 152.5 lb

## 2022-01-20 DIAGNOSIS — D696 Thrombocytopenia, unspecified: Secondary | ICD-10-CM | POA: Insufficient documentation

## 2022-01-20 DIAGNOSIS — E785 Hyperlipidemia, unspecified: Secondary | ICD-10-CM | POA: Insufficient documentation

## 2022-01-20 DIAGNOSIS — I5022 Chronic systolic (congestive) heart failure: Secondary | ICD-10-CM | POA: Diagnosis not present

## 2022-01-20 DIAGNOSIS — Z8673 Personal history of transient ischemic attack (TIA), and cerebral infarction without residual deficits: Secondary | ICD-10-CM | POA: Insufficient documentation

## 2022-01-20 DIAGNOSIS — F17210 Nicotine dependence, cigarettes, uncomplicated: Secondary | ICD-10-CM | POA: Diagnosis not present

## 2022-01-20 DIAGNOSIS — I10 Essential (primary) hypertension: Secondary | ICD-10-CM | POA: Diagnosis not present

## 2022-01-20 DIAGNOSIS — F32A Depression, unspecified: Secondary | ICD-10-CM | POA: Insufficient documentation

## 2022-01-20 DIAGNOSIS — E875 Hyperkalemia: Secondary | ICD-10-CM | POA: Diagnosis not present

## 2022-01-20 DIAGNOSIS — Z992 Dependence on renal dialysis: Secondary | ICD-10-CM

## 2022-01-20 DIAGNOSIS — D7581 Myelofibrosis: Secondary | ICD-10-CM | POA: Diagnosis not present

## 2022-01-20 DIAGNOSIS — N186 End stage renal disease: Secondary | ICD-10-CM | POA: Diagnosis not present

## 2022-01-20 DIAGNOSIS — Z720 Tobacco use: Secondary | ICD-10-CM

## 2022-01-20 DIAGNOSIS — I132 Hypertensive heart and chronic kidney disease with heart failure and with stage 5 chronic kidney disease, or end stage renal disease: Secondary | ICD-10-CM | POA: Diagnosis not present

## 2022-01-20 DIAGNOSIS — D649 Anemia, unspecified: Secondary | ICD-10-CM | POA: Insufficient documentation

## 2022-01-20 DIAGNOSIS — I471 Supraventricular tachycardia: Secondary | ICD-10-CM | POA: Diagnosis not present

## 2022-01-20 DIAGNOSIS — I2510 Atherosclerotic heart disease of native coronary artery without angina pectoris: Secondary | ICD-10-CM | POA: Insufficient documentation

## 2022-01-20 DIAGNOSIS — J449 Chronic obstructive pulmonary disease, unspecified: Secondary | ICD-10-CM | POA: Insufficient documentation

## 2022-01-20 DIAGNOSIS — J432 Centrilobular emphysema: Secondary | ICD-10-CM | POA: Diagnosis not present

## 2022-01-20 NOTE — Patient Instructions (Signed)
Continue weighing daily and call for an overnight weight gain of 3 pounds or more or a weekly weight gain of more than 5 pounds.   If you have voicemail, please make sure your mailbox is cleaned out so that we may leave a message and please make sure to listen to any voicemails.     

## 2022-01-21 DIAGNOSIS — Z992 Dependence on renal dialysis: Secondary | ICD-10-CM | POA: Diagnosis not present

## 2022-01-21 DIAGNOSIS — N186 End stage renal disease: Secondary | ICD-10-CM | POA: Diagnosis not present

## 2022-01-22 DIAGNOSIS — N186 End stage renal disease: Secondary | ICD-10-CM | POA: Diagnosis not present

## 2022-01-22 DIAGNOSIS — Z992 Dependence on renal dialysis: Secondary | ICD-10-CM | POA: Diagnosis not present

## 2022-01-23 ENCOUNTER — Encounter: Payer: Self-pay | Admitting: Internal Medicine

## 2022-01-23 ENCOUNTER — Ambulatory Visit (INDEPENDENT_AMBULATORY_CARE_PROVIDER_SITE_OTHER): Payer: Medicare HMO | Admitting: Internal Medicine

## 2022-01-23 ENCOUNTER — Ambulatory Visit
Admission: RE | Admit: 2022-01-23 | Discharge: 2022-01-23 | Disposition: A | Discharge status: Home / Self Care | Payer: Medicare HMO | Attending: Internal Medicine | Admitting: Internal Medicine

## 2022-01-23 ENCOUNTER — Ambulatory Visit
Admission: RE | Admit: 2022-01-23 | Discharge: 2022-01-23 | Disposition: A | Discharge status: Home / Self Care | Payer: Medicare HMO | Source: Ambulatory Visit | Attending: Internal Medicine | Admitting: Internal Medicine

## 2022-01-23 VITALS — BP 120/68 | HR 93 | Ht 67.0 in | Wt 150.0 lb

## 2022-01-23 DIAGNOSIS — W19XXXA Unspecified fall, initial encounter: Secondary | ICD-10-CM | POA: Diagnosis not present

## 2022-01-23 DIAGNOSIS — Z992 Dependence on renal dialysis: Secondary | ICD-10-CM

## 2022-01-23 DIAGNOSIS — N186 End stage renal disease: Secondary | ICD-10-CM

## 2022-01-23 DIAGNOSIS — I5022 Chronic systolic (congestive) heart failure: Secondary | ICD-10-CM

## 2022-01-23 DIAGNOSIS — R9431 Abnormal electrocardiogram [ECG] [EKG]: Secondary | ICD-10-CM | POA: Diagnosis not present

## 2022-01-23 DIAGNOSIS — I2510 Atherosclerotic heart disease of native coronary artery without angina pectoris: Secondary | ICD-10-CM

## 2022-01-23 DIAGNOSIS — M545 Low back pain, unspecified: Secondary | ICD-10-CM | POA: Insufficient documentation

## 2022-01-23 MED ORDER — LOSARTAN POTASSIUM 25 MG PO TABS: 12.5000 mg | ORAL_TABLET | Freq: Every day | ORAL | 1 refills | Status: DC

## 2022-01-23 NOTE — Patient Instructions (Signed)
Medication Instructions:  ? ?Your physician has recommended you make the following change in your medication:  ? ?START Losartan 12.5 mg daily  ? ?*If you need a refill on your cardiac medications before your next appointment, please call your pharmacy* ? ? ?Lab Work: ? ?None ordered ? ?Testing/Procedures: ? ?Your physician has ordered an X-Ray of your lumbar spine ? ?-  [facility] to the [facility].  ?-  You will check in at the front desk to the right as you walk into the atrium.  ? ? ?Follow-Up: ?At [facility], you and your health needs are our priority.  As part of our continuing mission to provide you with exceptional heart care, we have created designated Provider Care Teams.  These Care Teams include your primary Cardiologist (physician) and Advanced Practice Providers (APPs -  Physician Assistants and Nurse Practitioners) who all work together to provide you with the care you need, when you need it. ? ?We recommend signing up for the patient portal called "MyChart".  Sign up information is provided on this After Visit Summary.  MyChart is used to connect with patients for Virtual Visits (Telemedicine).  Patients are able to view lab/test results, encounter notes, upcoming appointments, etc.  Non-urgent messages can be sent to your provider as well.   ?To learn more about what you can do with MyChart, go to [email or web address].   ? ?Your next appointment:   ?6 week(s) ? ?The format for your next appointment:   ?In Person ? ?Provider:   ?You may see Nelva Bush, MD or one of the following Advanced Practice Providers on your designated Care Team:   ?Murray Hodgkins, NP ?Christell Faith, PA-C ?Cadence Kathlen Mody, PA-C{ ? ? ?Important Information About Sugar ? ? ? ? ? ? ?

## 2022-01-23 NOTE — Progress Notes (Signed)
? ?Follow-up Outpatient Visit ?Date: 01/23/2022 ? ?Primary Care Provider: ?Valerie Roys, DO ?[address] ?[place] [address] ? ?Chief Complaint: Back pain ? ?HPI:  James Arnold is a 66 y.o. male with history of coronary artery disease status post multiple PCI's (most recently LAD and LCx at [facility] in 08/2021), HFrEF, hypertension, hyperlipidemia, stroke, Eveleigh Crumpler-stage renal disease on peritoneal dialysis, COPD, and myelofibrosis, who presents for follow-up of coronary artery disease and heart failure.  I last saw him in late March, at which time he reported feeling better with less dizziness after undergoing ENT evaluation.  He was admitted in early April for acute on chronic HFrEF.  Subsequent echo showed continued severely reduced LVEF, even slightly worse compared to prior study in 08/2021 before his PCI at [facility].  He was seen earlier this week in the heart failure clinic by Darylene Price, NP, at which time he complained of fatigue, shortness of breath, and intermittent chest pain no medication changes were made. ? ?Today, James Arnold is most concerned about back pain.  He has chronic low back pain and was recently told by an orthopedist that he has arthritis.  He was placed on a higher dose of steroids as well as a muscle relaxant with some improvement in pain.  However, this past weekend, he slipped and fell while taking out the trash.  His back pain has been significantly worse since then.  He has generalized weakness that is chronic but denies new focal weakness and paresthesias.  He has not had any chest pain or edema.  Exertional dyspnea has been minimal.  He still gets dizzy from time to time though this seems to improve when he does vestibular maneuvers at home.  He remains compliant with peritoneal dialysis and notes that his number of fluid exchanges was recently increased by Dr. Holley Raring. ? ?-------------------------------------------------------------------------------------------------- ? ?Past Medical History:   ?Diagnosis Date  ? Anemia   ? Benign hypertensive renal disease   ? CAD (coronary artery disease)   ? a. 08/1996 s/p BMS to the mLAD ([facility]); b. 12/2017 MV: EF 46%, --> low risk; c. 07/2020 MV: EF 51%, no ischemia/infarct; d. 08/2021 Cath/PCI: LM nl, LAD 60p, 95p/m ISR (3.0x48 Synergy DES), RI small, nl, LCX 40p, 95d(3.5x16 Synergy DES), OM1 50, RCA 40p/142m  ? Carotid arterial disease ([place]   ? a. 11/2020 Carotid U/S: <50% bilat ICA stenoses.  ? CHF (congestive heart failure) ([place]   ? Chronic HFrEF (heart failure with reduced ejection fraction) ([place]   ? a. 08/2022 Echo: EF 35-40%.  ? CKD (chronic kidney disease), stage V ([place]   ? on peritoneal dialysis  ? Complication of anesthesia   ? please call by "[name]" when waking up!!  ? COPD (chronic obstructive pulmonary disease) ([place]   ? centrilobular emphysema. pt is poorly controlled with his copd/smoking.  ? Depression   ? Diastolic dysfunction   ? a. 12/2017 Echo: EF 60-65%, no rwma, Gr1 DD, mild AI/MR. Nl RVSP; b. 08/2020 Echo: EF 60-65%, no rwma, GrI DD, nl RV fxn. RVSP 32.531mg.  ? Dyspnea on exertion   ? with exertion  ? Fatigue   ? FSGS (focal segmental glomerulosclerosis)   ? Hyperlipidemia 10/2002  ? Hypertension   ? Ischemic cardiomyopathy   ? a. 08/2022 Echo: EF 35-40%, glob HK, GrII DD, nl RV fxn, mildly dil LA, mild MR, mild AS.  ? myelofibrosis   ? bone marrow failure  ? [place]  ? JAK2 (+) primary  ?  Myocardial infarction [facility]) 1997  ? stent x 1  ? Pneumoperitoneum 02/2021  ? PSVT (paroxysmal supraventricular tachycardia) ([place])   ? a. 12/2020 Zio: Sinus rhythm 80 (55-117). Rare PACs/PVCs. 17 episodes of SVT (longest 20.3 secs, fastest 184). No triggered events. No afib.  ? Smoker   ? Stroke [facility])   ? a. 11/2020 MRI Brain: patchy acute/early subacute cortical infarcts w/in the R frontal, parietal, and occiptal lobes. Small, chronic L basal ganglia lacunar infarct.  ? Thrombocytopenia ([place])   ? ?Past Surgical History:  ?Procedure  Laterality Date  ? ANGIOPLASTY    ? BONE MARROW ASPIRATION  03/2009  ? CARDIAC CATHETERIZATION  1997  ? stent x 1  ? COLONOSCOPY WITH PROPOFOL N/A 08/09/2018  ? Procedure: COLONOSCOPY WITH PROPOFOL;  Surgeon: [name], MD;  Location: [facility] ENDOSCOPY;  Service: Gastroenterology;  Laterality: N/A;  ? CORONARY ARTERY BYPASS GRAFT    ? CORONARY STENT PLACEMENT  08/1996  ? ESOPHAGOGASTRODUODENOSCOPY (EGD) WITH PROPOFOL N/A 07/25/2021  ? Procedure: ESOPHAGOGASTRODUODENOSCOPY (EGD) WITH PROPOFOL;  Surgeon: [name], MD;  Location: [facility] ENDOSCOPY;  Service: Gastroenterology;  Laterality: N/A;  ? EXTERIORIZATION OF A CONTINUOUS AMBULATORY PERITONEAL DIALYSIS CATHETER    ? EYE SURGERY Bilateral   ? cats removed  ? LEFT HEART CATH AND CORONARY ANGIOGRAPHY N/A 09/12/2021  ? Procedure: LEFT HEART CATH AND CORONARY ANGIOGRAPHY;  Surgeon: Wellington Hampshire, MD;  Location: [place] CV LAB;  Service: Cardiovascular;  Laterality: N/A;  ? REVERSE SHOULDER ARTHROPLASTY Left 11/19/2020  ? Procedure: Left reverse shoulder arthroplasty;  Surgeon: Leim Fabry, MD;  Location: [facility] ORS;  Service: Orthopedics;  Laterality: Left;  ? ? ?Current Meds  ?Medication Sig  ? acetaminophen (TYLENOL) 500 MG tablet Take 500 mg by mouth every 6 (six) hours as needed for headache, fever, moderate pain or mild pain.  ? albuterol (PROVENTIL) (2.5 MG/3ML) 0.083% nebulizer solution Take 3 mLs (2.5 mg total) by nebulization every 6 (six) hours as needed for wheezing or shortness of breath.  ? albuterol (VENTOLIN HFA) 108 (90 Base) MCG/ACT inhaler Inhale 2 puffs into the lungs every 6 (six) hours as needed for wheezing or shortness of breath.  ? allopurinol (ZYLOPRIM) 100 MG tablet Take 100 mg by mouth daily.  ? aspirin 81 MG chewable tablet Chew 1 tablet (81 mg total) by mouth daily.  ? atorvastatin (LIPITOR) 40 MG tablet Take 1 tablet (40 mg total) by mouth daily.  ? carvedilol (COREG) 12.5 MG tablet Take 1 tablet (12.5 mg total) by mouth 2 (two)  times daily.  ? Cholecalciferol (VITAMIN D-1000 MAX ST) 25 MCG (1000 UT) tablet Take 1,000 Units by mouth daily.   ? citalopram (CELEXA) 40 MG tablet Take 1.5 tablets (60 mg total) by mouth daily.  ? clopidogrel (PLAVIX) 75 MG tablet Take 75 mg by mouth daily.  ? ferrous sulfate 325 (65 FE) MG tablet Take 1 tablet (325 mg total) by mouth daily with breakfast.  ? furosemide (LASIX) 80 MG tablet Take 1 tablet (80 mg total) by mouth daily. Increased from 20 mg daily.  ? gentamicin cream (GARAMYCIN) 0.1 % Apply 1 application topically every other day.  ? levETIRAcetam (KEPPRA) 500 MG tablet Take 1 tablet (500 mg total) by mouth at bedtime.  ? meclizine (ANTIVERT) 12.5 MG tablet Take 1 tablet (12.5 mg total) by mouth 2 (two) times daily as needed for dizziness.  ? mirabegron ER (MYRBETRIQ) 25 MG TB24 tablet Take 1 tablet (25 mg total) by mouth daily.  ? nitroGLYCERIN (  NITROSTAT) 0.4 MG SL tablet Place 1 tablet (0.4 mg total) under the tongue every 5 (five) minutes as needed for chest pain.  ? nortriptyline (PAMELOR) 25 MG capsule Take 1 capsule (25 mg total) by mouth at bedtime.  ? pantoprazole (PROTONIX) 20 MG tablet Take 20 mg by mouth daily.  ? polyethylene glycol (MIRALAX / GLYCOLAX) 17 g packet Take 17 g by mouth daily as needed.  ? predniSONE (DELTASONE) 10 MG tablet Take 10 mg by mouth daily with breakfast.  ? tamsulosin (FLOMAX) 0.4 MG CAPS capsule Take 2 capsules (0.8 mg total) by mouth daily.  ? ? ?Allergies: Patient has no known allergies. ? ?Social History  ? ?Tobacco Use  ? Smoking status: Former  ?  Packs/day: 0.50  ?  Years: 47.00  ?  Pack years: 23.50  ?  Types: Cigarettes  ?  Quit date: 09/11/2021  ?  Years since quitting: 0.3  ? Smokeless tobacco: Never  ? Tobacco comments:  ?  trying to cut back-smokes 6 to 7 cigs per day  ?Vaping Use  ? Vaping Use: Never used  ?Substance Use Topics  ? Alcohol use: Yes  ?  Comment: occasionally  ? Drug use: No  ? ? ?Family History  ?Problem Relation Age of Onset  ?  Stroke Mother   ?     Low BP stroke  ? Depression Father   ? Depression Sister   ?     Breast  ? Heart disease Other   ? Breast cancer Other   ? Lung cancer Other   ? Ovarian cancer Other   ? Stomach cancer Other

## 2022-01-24 DIAGNOSIS — N186 End stage renal disease: Secondary | ICD-10-CM | POA: Diagnosis not present

## 2022-01-24 DIAGNOSIS — Z992 Dependence on renal dialysis: Secondary | ICD-10-CM | POA: Diagnosis not present

## 2022-01-26 DIAGNOSIS — Z992 Dependence on renal dialysis: Secondary | ICD-10-CM | POA: Diagnosis not present

## 2022-01-26 DIAGNOSIS — N186 End stage renal disease: Secondary | ICD-10-CM | POA: Diagnosis not present

## 2022-01-27 ENCOUNTER — Encounter: Payer: Self-pay | Admitting: Internal Medicine

## 2022-01-27 DIAGNOSIS — Z992 Dependence on renal dialysis: Secondary | ICD-10-CM | POA: Diagnosis not present

## 2022-01-27 DIAGNOSIS — N186 End stage renal disease: Secondary | ICD-10-CM | POA: Diagnosis not present

## 2022-01-27 NOTE — Telephone Encounter (Signed)
End, Harrell Gave, MD  You 1 hour ago (12:46 PM)  ? ?I am not sure why losartan suddenly made [name] short of breath (he had been on the same medication/dose a few months ago without problems).  However, given his symptoms I agree with stopping the medication.  I will add losartan to his allergy list.  ? ?[name]   ? ?Called pt's wife and notified of Dr. Darnelle Bos recc above.  ?Wife voiced understanding. Pt will stop Losartan.  ?Pt will let us know of any further concerns.  ?

## 2022-01-27 NOTE — Telephone Encounter (Signed)
Called to speak with pt's wife (DPR) to follow up [employer]. ?Wife states that since pt restarted Losartan at reduced dose 12.5 mg daily after last ov 5/4, pt has had incr shortness of breath.  ?Dyspnea on exertion worsened, and then this morning at 3 am pt woke up short of breath.  ?Pt has not had any recurrent dizziness.  ?Pt completed nebulizer treatment this morning and is stable at this time.  ?Vitals this morning: BP 130/"60s" HR 72 ?Wife/pt aware of ER precautions.  ?Pt did take Losartan this morning, but wife states that pt is not going to continue after today to see if s/s improve.  ?Notified wife that I will make Dr. Saunders Revel aware for further recc.  ?

## 2022-01-28 DIAGNOSIS — Z992 Dependence on renal dialysis: Secondary | ICD-10-CM | POA: Diagnosis not present

## 2022-01-28 DIAGNOSIS — N186 End stage renal disease: Secondary | ICD-10-CM | POA: Diagnosis not present

## 2022-01-29 DIAGNOSIS — N186 End stage renal disease: Secondary | ICD-10-CM | POA: Diagnosis not present

## 2022-01-29 DIAGNOSIS — Z992 Dependence on renal dialysis: Secondary | ICD-10-CM | POA: Diagnosis not present

## 2022-01-30 ENCOUNTER — Ambulatory Visit (INDEPENDENT_AMBULATORY_CARE_PROVIDER_SITE_OTHER): Payer: Medicare HMO | Admitting: Family Medicine

## 2022-01-30 ENCOUNTER — Encounter: Payer: Self-pay | Admitting: Family Medicine

## 2022-01-30 ENCOUNTER — Telehealth: Payer: Self-pay | Admitting: Family Medicine

## 2022-01-30 VITALS — BP 116/63 | HR 70 | Temp 98.0°F | Wt 150.2 lb

## 2022-01-30 DIAGNOSIS — N186 End stage renal disease: Secondary | ICD-10-CM | POA: Diagnosis not present

## 2022-01-30 DIAGNOSIS — E875 Hyperkalemia: Secondary | ICD-10-CM | POA: Diagnosis not present

## 2022-01-30 DIAGNOSIS — Z992 Dependence on renal dialysis: Secondary | ICD-10-CM | POA: Diagnosis not present

## 2022-01-30 DIAGNOSIS — R9431 Abnormal electrocardiogram [ECG] [EKG]: Secondary | ICD-10-CM

## 2022-01-30 DIAGNOSIS — F331 Major depressive disorder, recurrent, moderate: Secondary | ICD-10-CM

## 2022-01-30 DIAGNOSIS — I129 Hypertensive chronic kidney disease with stage 1 through stage 4 chronic kidney disease, or unspecified chronic kidney disease: Secondary | ICD-10-CM | POA: Diagnosis not present

## 2022-01-30 MED ORDER — DULOXETINE HCL 60 MG PO CPEP: 60.0000 mg | ORAL_CAPSULE | Freq: Every day | ORAL | 1 refills | Status: AC

## 2022-01-30 NOTE — Assessment & Plan Note (Signed)
Rechecking labs. Was doing better after kayexalate. Await results.  ?

## 2022-01-30 NOTE — Telephone Encounter (Signed)
Patient is unavailable, left provider message with Jackelyn Poling, his wife. No further questions.  ?

## 2022-01-30 NOTE — Telephone Encounter (Signed)
Please let him know that Dr End is concerned about his mood medicine interacting with his other medicines to effect his heart. I'd like him to switch from the citalopram to duloxetine. 1.5 pill of the citalopram to 1 pill of the duloxetine switch- I'm going to send it in for him and we'll see how he's doing next visit. Thanks! ?

## 2022-01-30 NOTE — Progress Notes (Signed)
? ?BP 116/63   Pulse 70   Temp 98 ?F (36.7 ?C)   Wt 150 lb 3.2 oz (68.1 kg)   SpO2 100%   BMI 23.52 kg/m?   ? ?Subjective:  ? ? [name], male    DOB: 05/04/1956, 66 y.o.   MRN: [id number] ? ?HPI: ?James Arnold is a 66 y.o. male ? ?Chief Complaint  ?Patient presents with  ? Hypertension  ? Coronary Artery Disease  ? ?HYPERTENSION ?Hypertension status: controlled  ?Satisfied with current treatment? yes ?Duration of hypertension: chronic ?BP monitoring frequency:  a few times a month ?BP medication side effects:  was SOB on losartan, so is not taking it any more ?Medication compliance: excellent compliance ?Previous BP meds:carvedilol, lasix ?Aspirin: yes ?Recurrent headaches: no ?Visual changes: no ?Palpitations: no ?Dyspnea: no ?Chest pain: no ?Lower extremity edema: no ?Dizzy/lightheaded: no ? ?DEPRESSION ?Mood status: controlled ?Satisfied with current treatment?: yes ?Symptom severity: mild  ?Duration of current treatment : months ?Side effects: no ?Medication compliance: excellent compliance ?Psychotherapy/counseling: no  ?Previous psychiatric medications: celexa ?Depressed mood: yes ?Anxious mood: yes ?Anhedonia: no ?Significant weight loss or gain: no ?Insomnia: no  ?Fatigue: yes ?Feelings of worthlessness or guilt: no ?Impaired concentration/indecisiveness: no ?Suicidal ideations: no ?Hopelessness: no ?Crying spells: no ? ?  01/30/2022  ?  9:37 AM 12/31/2021  ? 10:22 AM 11/18/2021  ?  9:45 AM 10/25/2021  ?  1:59 PM 10/22/2021  ?  8:36 AM  ?Depression screen PHQ 2/9  ?Decreased Interest 0 _0 ?Down, Depressed, Hopeless 0 2 0 0 0  ?PHQ - 2 Score 0 _1 ?Altered sleeping 1 3 0 0 1  ?Tired, decreased energy _2 ?Change in appetite 0 2 0 0 0  ?Feeling bad or failure about yourself  0 0 0 0 0  ?Trouble concentrating 0 0 0 1 0  ?Moving slowly or fidgety/restless 0 0 0 2 0  ?Suicidal thoughts 0 0 0 0 0  ?PHQ-9 Score _3 ?Difficult doing work/chores    Somewhat difficult Not  difficult at all  ? ? ? ?Relevant past medical, surgical, family and social history reviewed and updated as indicated. Interim medical history since our last visit reviewed. ?Allergies and medications reviewed and updated. ? ?Review of Systems  ?Constitutional: Negative.   ?Respiratory: Negative.    ?Cardiovascular: Negative.   ?Gastrointestinal: Negative.   ?Musculoskeletal: Negative.   ?Neurological: Negative.   ?Psychiatric/Behavioral: Negative.    ? ?Per HPI unless specifically indicated above ? ?   ?Objective:  ?  ?BP 116/63   Pulse 70   Temp 98 ?F (36.7 ?C)   Wt 150 lb 3.2 oz (68.1 kg)   SpO2 100%   BMI 23.52 kg/m?   ?Wt Readings from Last 3 Encounters:  ?01/30/22 150 lb 3.2 oz (68.1 kg)  ?01/23/22 150 lb (68 kg)  ?01/20/22 152 lb 8 oz (69.2 kg)  ?  ?Physical Exam ?Vitals and nursing note reviewed.  ?Constitutional:   ?   General: He is not in acute distress. ?   Appearance: Normal appearance. He is not ill-appearing, toxic-appearing or diaphoretic.  ?HENT:  ?   Head: Normocephalic and atraumatic.  ?   Right Ear: External ear normal.  ?   Left Ear: External ear normal.  ?   Nose: Nose normal.  ?   Mouth/Throat:  ?   Mouth: Mucous membranes are moist.  ?  Pharynx: Oropharynx is clear.  ?Eyes:  ?   General: No scleral icterus.    ?   Right eye: No discharge.     ?   Left eye: No discharge.  ?   Extraocular Movements: Extraocular movements intact.  ?   Conjunctiva/sclera: Conjunctivae normal.  ?   Pupils: Pupils are equal, round, and reactive to light.  ?Cardiovascular:  ?   Rate and Rhythm: Normal rate and regular rhythm.  ?   Pulses: Normal pulses.  ?   Heart sounds: Normal heart sounds. No murmur heard. ?  No friction rub. No gallop.  ?Pulmonary:  ?   Effort: Pulmonary effort is normal. No respiratory distress.  ?   Breath sounds: Normal breath sounds. No stridor. No wheezing, rhonchi or rales.  ?Chest:  ?   Chest wall: No tenderness.  ?Musculoskeletal:     ?   General: Normal range of motion.  ?    Cervical back: Normal range of motion and neck supple.  ?Skin: ?   General: Skin is warm and dry.  ?   Capillary Refill: Capillary refill takes less than 2 seconds.  ?   Coloration: Skin is not jaundiced or pale.  ?   Findings: No bruising, erythema, lesion or rash.  ?Neurological:  ?   General: No focal deficit present.  ?   Mental Status: He is alert and oriented to person, place, and time. Mental status is at baseline.  ?Psychiatric:     ?   Mood and Affect: Mood normal.     ?   Behavior: Behavior normal.     ?   Thought Content: Thought content normal.     ?   Judgment: Judgment normal.  ? ? ?Results for orders placed or performed in visit on 01/10/22  ?Basic metabolic panel  ?Result Value Ref Range  ? Glucose 96 70 - 99 mg/dL  ? BUN 61 (H) 8 - 27 mg/dL  ? Creatinine, Ser 7.15 (H) 0.76 - 1.27 mg/dL  ? eGFR 8 (L) >59 mL/min/1.73  ? BUN/Creatinine Ratio 9 (L) 10 - 24  ? Sodium 142 134 - 144 mmol/L  ? Potassium 4.6 3.5 - 5.2 mmol/L  ? Chloride 99 96 - 106 mmol/L  ? CO2 22 20 - 29 mmol/L  ? Calcium 7.2 (L) 8.6 - 10.2 mg/dL  ?Uric acid  ?Result Value Ref Range  ? Uric Acid 8.2 3.8 - 8.4 mg/dL  ? ?   ?Assessment & Plan:  ? ?Problem List Items Addressed This Visit   ? ?  ? Genitourinary  ? Benign hypertensive renal disease  ?  Doing well off losartan. Continue current regimen. Continue to monitor. Call with any concerns.  ? ?  ?  ?  ? Other  ? Major depression, recurrent ([place])  ?  Has been doing well on his medicine. Concern for QT prolongation. Will change celexa to cymbalta and recheck 3 months. Call with any concerns. Continue to monitor.  ? ?  ?  ? Hyperkalemia - Primary  ?  Rechecking labs. Was doing better after kayexalate. Await results.  ? ?  ?  ? Relevant Orders  ? Basic metabolic panel  ? QT prolongation  ?  Will change his celexa to cymbalta and recheck 3 months. Call with any concerns.  ? ?  ?  ?  ? ?Follow up plan: ?Return in about 3 months (around 05/02/2022). ? ? ? ? ? ?

## 2022-01-30 NOTE — Assessment & Plan Note (Signed)
Doing well off losartan. Continue current regimen. Continue to monitor. Call with any concerns.  ?

## 2022-01-30 NOTE — Assessment & Plan Note (Signed)
Will change his celexa to cymbalta and recheck 3 months. Call with any concerns.  ?

## 2022-01-30 NOTE — Assessment & Plan Note (Signed)
Has been doing well on his medicine. Concern for QT prolongation. Will change celexa to cymbalta and recheck 3 months. Call with any concerns. Continue to monitor.  ?

## 2022-01-31 DIAGNOSIS — Z992 Dependence on renal dialysis: Secondary | ICD-10-CM | POA: Diagnosis not present

## 2022-01-31 DIAGNOSIS — N186 End stage renal disease: Secondary | ICD-10-CM | POA: Diagnosis not present

## 2022-01-31 LAB — BASIC METABOLIC PANEL
BUN/Creatinine Ratio: 9 — ABNORMAL LOW (ref 10–24)
BUN: 60 mg/dL — ABNORMAL HIGH (ref 8–27)
CO2: 20 mmol/L (ref 20–29)
Calcium: 7.7 mg/dL — ABNORMAL LOW (ref 8.6–10.2)
Chloride: 102 mmol/L (ref 96–106)
Creatinine, Ser: 6.81 mg/dL — ABNORMAL HIGH (ref 0.76–1.27)
Glucose: 125 mg/dL — ABNORMAL HIGH (ref 70–99)
Potassium: 4.4 mmol/L (ref 3.5–5.2)
Sodium: 143 mmol/L (ref 134–144)
eGFR: 8 mL/min/{1.73_m2} — ABNORMAL LOW (ref >59)

## 2022-02-02 DIAGNOSIS — N186 End stage renal disease: Secondary | ICD-10-CM | POA: Diagnosis not present

## 2022-02-02 DIAGNOSIS — Z992 Dependence on renal dialysis: Secondary | ICD-10-CM | POA: Diagnosis not present

## 2022-02-03 DIAGNOSIS — Z992 Dependence on renal dialysis: Secondary | ICD-10-CM | POA: Diagnosis not present

## 2022-02-03 DIAGNOSIS — N186 End stage renal disease: Secondary | ICD-10-CM | POA: Diagnosis not present

## 2022-02-04 ENCOUNTER — Other Ambulatory Visit: Payer: Self-pay | Admitting: Family Medicine

## 2022-02-04 DIAGNOSIS — N186 End stage renal disease: Secondary | ICD-10-CM | POA: Diagnosis not present

## 2022-02-04 DIAGNOSIS — Z992 Dependence on renal dialysis: Secondary | ICD-10-CM | POA: Diagnosis not present

## 2022-02-05 DIAGNOSIS — Z992 Dependence on renal dialysis: Secondary | ICD-10-CM | POA: Diagnosis not present

## 2022-02-05 DIAGNOSIS — N186 End stage renal disease: Secondary | ICD-10-CM | POA: Diagnosis not present

## 2022-02-05 NOTE — Telephone Encounter (Signed)
Refilled 01/02/2022 #[id number] refills. ?Requested Prescriptions  ?Pending Prescriptions Disp Refills  ?? tamsulosin (FLOMAX) 0.4 MG CAPS capsule [Pharmacy Med Name: TAMSULOSIN HYDROCHLORIDE 0.4 MG Capsule] 180 capsule   ?  Sig: TAKE 2 CAPSULES (0.8 MG TOTAL) BY MOUTH DAILY.  ?  ? Urology: Alpha-Adrenergic Blocker Passed - 02/04/2022  1:08 PM  ?  ?  Passed - PSA in normal range and within 360 days  ?  PSA  ?Date Value Ref Range Status  ?07/26/2014 0.3  Final  ? ?Prostate Specific Ag, Serum  ?Date Value Ref Range Status  ?10/16/2021 0.3 0.0 - 4.0 ng/mL Final  ?  Comment:  ?  Roche ECLIA methodology. ?According to the American Urological Association, Serum PSA should ?decrease and remain at undetectable levels after radical ?prostatectomy. The AUA defines biochemical recurrence as an initial ?PSA value 0.2 ng/mL or greater followed by a subsequent confirmatory ?PSA value 0.2 ng/mL or greater. ?Values obtained with different assay methods or kits cannot be used ?interchangeably. Results cannot be interpreted as absolute evidence ?of the presence or absence of malignant disease. ?  ?   ?  ?  Passed - Last BP in normal range  ?  BP Readings from Last 1 Encounters:  ?01/30/22 116/63  ?   ?  ?  Passed - Valid encounter within last 12 months  ?  Recent Outpatient Visits   ?      ? 6 days ago Hyperkalemia  ? [place], Megan P, DO  ? 1 month ago Acute on chronic combined systolic and diastolic CHF (congestive heart failure) ([place])  ? [place], Megan P, DO  ? 2 months ago SOB (shortness of breath)  ? Waldron P, DO  ? 3 months ago Cellulitis, unspecified cellulitis site  ? [place], NP  ? 3 months ago Cellulitis, unspecified cellulitis site  ? [facility] Jon Billings, NP  ?  ?  ?Future Appointments   ?        ? In 2 days Stoioff, Ronda Fairly, MD [place]  ? In 1 month Sharolyn Douglas,  Clance Boll, NP [facility]  ? In 2 months Wynetta Emery, Barb Merino, DO [facility]  ?  ? ?  ?  ?  ? ?

## 2022-02-06 DIAGNOSIS — Z992 Dependence on renal dialysis: Secondary | ICD-10-CM | POA: Diagnosis not present

## 2022-02-06 DIAGNOSIS — N186 End stage renal disease: Secondary | ICD-10-CM | POA: Diagnosis not present

## 2022-02-07 ENCOUNTER — Ambulatory Visit (INDEPENDENT_AMBULATORY_CARE_PROVIDER_SITE_OTHER): Payer: Medicare HMO | Admitting: Urology

## 2022-02-07 ENCOUNTER — Encounter: Payer: Self-pay | Admitting: Urology

## 2022-02-07 VITALS — BP 109/66 | HR 94 | Ht 67.0 in | Wt 152.0 lb

## 2022-02-07 DIAGNOSIS — N186 End stage renal disease: Secondary | ICD-10-CM | POA: Diagnosis not present

## 2022-02-07 DIAGNOSIS — N401 Enlarged prostate with lower urinary tract symptoms: Secondary | ICD-10-CM

## 2022-02-07 DIAGNOSIS — Z992 Dependence on renal dialysis: Secondary | ICD-10-CM | POA: Diagnosis not present

## 2022-02-07 LAB — BLADDER SCAN AMB NON-IMAGING

## 2022-02-07 NOTE — Progress Notes (Signed)
02/07/2022 10:20 AM   James Arnold 11-25-1955 [id number]  Referring provider: Valerie Roys, DO [place],  [place] [address]  Chief Complaint  Patient presents with   Benign Prostatic Hypertrophy    HPI: 66 y.o.  male presents for follow-up visit.  Refer to my prior note 01/08/2022 Voiding symptoms significantly improved on Myrbetriq.  He completed the samples and remains on tamsulosin and voiding symptoms have not recurred Recently saw his cardiologist and and some concerns regarding Myrbetriq and long QT No complaints today   PMH: Past Medical History:  Diagnosis Date   Anemia    Benign hypertensive renal disease    CAD (coronary artery disease)    a. 08/1996 s/p BMS to the mLAD ([facility]); b. 12/2017 MV: EF 46%, --> low risk; c. 07/2020 MV: EF 51%, no ischemia/infarct; d. 08/2021 Cath/PCI: LM nl, LAD 60p, 95p/m ISR (3.0x48 Synergy DES), RI small, nl, LCX 40p, 95d(3.5x16 Synergy DES), OM1 50, RCA 40p/134m   Carotid arterial disease ([facility]    a. 11/2020 Carotid U/S: <50% bilat ICA stenoses.   CHF (congestive heart failure) (HCC)    Chronic HFrEF (heart failure with reduced ejection fraction) ([facility]    a. 08/2022 Echo: EF 35-40%.   CKD (chronic kidney disease), stage V ([facility]    on peritoneal dialysis   Complication of anesthesia    please call by "[name]" when waking up!!   COPD (chronic obstructive pulmonary disease) (HCC)    centrilobular emphysema. pt is poorly controlled with his copd/smoking.   Depression    Diastolic dysfunction    a. 12/2017 Echo: EF 60-65%, no rwma, Gr1 DD, mild AI/MR. Nl RVSP; b. 08/2020 Echo: EF 60-65%, no rwma, GrI DD, nl RV fxn. RVSP 32.554mg.   Dyspnea on exertion    with exertion   Fatigue    FSGS (focal segmental glomerulosclerosis)    Hyperlipidemia 10/2002   Hypertension    Ischemic cardiomyopathy    a. 08/2022 Echo: EF 35-40%, glob HK, GrII DD, nl RV fxn, mildly dil LA, mild MR, mild AS.   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 12/2020 [facility]: Sinus rhythm 80 (55-117). Rare PACs/PVCs. 17 episodes of SVT (longest 20.3 secs, fastest 184). No triggered events. No afib.   Smoker    [facility]   a. 11/2020 MRI Brain: patchy acute/early subacute cortical infarcts w/in the R frontal, parietal, and occiptal lobes. Small, chronic L basal ganglia lacunar infarct.   [facility]    Surgical History: Past Surgical History:  Procedure Laterality Date   ANGIOPLASTY     BONE MARROW ASPIRATION  03/2009   CARDIAC CATHETERIZATION  1997   stent x 1   COLONOSCOPY WITH PROPOFOL N/A 08/09/2018   Procedure: COLONOSCOPY WITH PROPOFOL;  Surgeon: AnJonathon BellowsMD;  [facility];  Service: Gastroenterology;  Laterality: N/A;   CORONARY ARTERY BYPASS GRAFT     CORONARY STENT PLACEMENT  08/1996   ESOPHAGOGASTRODUODENOSCOPY (EGD) WITH PROPOFOL N/A 07/25/2021   Procedure: ESOPHAGOGASTRODUODENOSCOPY (EGD) WITH PROPOFOL;  Surgeon: AnJonathon BellowsMD;  [facility];  Service: Gastroenterology;  Laterality: N/A;   EXTERIORIZATION OF A CONTINUOUS AMBULATORY PERITONEAL DIALYSIS CATHETER     EYE SURGERY Bilateral    cats removed   LEFT HEART CATH  AND CORONARY ANGIOGRAPHY N/A 09/12/2021   Procedure: LEFT HEART CATH AND CORONARY ANGIOGRAPHY;  Surgeon: Wellington Hampshire, MD;  Location: [facility];  Service: Cardiovascular;  Laterality: N/A;   REVERSE SHOULDER ARTHROPLASTY Left 11/19/2020   Procedure: Left reverse shoulder arthroplasty;  Surgeon: Leim Fabry, MD;  Location: [facility] ORS;  Service: Orthopedics;  Laterality: Left;    Home Medications:  Allergies as of 02/07/2022       Reactions   Losartan Shortness Of Breath        Medication List        Accurate as of Feb 07, 2022 10:20 AM. If you have any questions, ask  your nurse or doctor.          STOP taking these medications    mirabegron ER 25 MG Tb24 tablet Commonly known as: MYRBETRIQ Stopped by: Abbie Sons, MD       TAKE these medications    acetaminophen 500 MG tablet Commonly known as: TYLENOL Take 500 mg by mouth every 6 (six) hours as needed for headache, fever, moderate pain or mild pain.   albuterol 108 (90 Base) MCG/ACT inhaler Commonly known as: VENTOLIN HFA Inhale 2 puffs into the lungs every 6 (six) hours as needed for wheezing or shortness of breath.   albuterol (2.5 MG/3ML) 0.083% nebulizer solution Commonly known as: PROVENTIL Take 3 mLs (2.5 mg total) by nebulization every 6 (six) hours as needed for wheezing or shortness of breath.   allopurinol 100 MG tablet Commonly known as: ZYLOPRIM Take 100 mg by mouth daily.   aspirin 81 MG chewable tablet Chew 1 tablet (81 mg total) by mouth daily.   atorvastatin 40 MG tablet Commonly known as: LIPITOR Take 1 tablet (40 mg total) by mouth daily.   carvedilol 12.5 MG tablet Commonly known as: COREG Take 1 tablet (12.5 mg total) by mouth 2 (two) times daily.   clopidogrel 75 MG tablet Commonly known as: PLAVIX Take 75 mg by mouth daily.   DULoxetine 60 MG capsule Commonly known as: Cymbalta Take 1 capsule (60 mg total) by mouth daily.   ferrous sulfate 325 (65 FE) MG tablet Take 1 tablet (325 mg total) by mouth daily with breakfast.   furosemide 80 MG tablet Commonly known as: LASIX Take 1 tablet (80 mg total) by mouth daily. Increased from 20 mg daily.   gentamicin cream 0.1 % Commonly known as: GARAMYCIN Apply 1 application topically every other day.   levETIRAcetam 500 MG tablet Commonly known as: KEPPRA Take 1 tablet (500 mg total) by mouth at bedtime.   meclizine 12.5 MG tablet Commonly known as: ANTIVERT Take 1 tablet (12.5 mg total) by mouth 2 (two) times daily as needed for dizziness.   nitroGLYCERIN 0.4 MG SL tablet Commonly known as:  NITROSTAT Place 1 tablet (0.4 mg total) under the tongue every 5 (five) minutes as needed for chest pain.   nortriptyline 25 MG capsule Commonly known as: Pamelor Take 1 capsule (25 mg total) by mouth at bedtime.   pantoprazole 20 MG tablet Commonly known as: PROTONIX Take 20 mg by mouth daily.   polyethylene glycol 17 g packet Commonly known as: MIRALAX / GLYCOLAX Take 17 g by mouth daily as needed.   predniSONE 10 MG tablet Commonly known as: DELTASONE Take 10 mg by mouth daily with breakfast.   SPS 15 GM/60ML suspension Generic drug: sodium polystyrene Take by mouth.   tamsulosin 0.4 MG Caps capsule Commonly known as: FLOMAX Take 2 capsules (  0.8 mg total) by mouth daily.   Vitamin D-1000 Max St 25 MCG (1000 UT) tablet Generic drug: Cholecalciferol Take 1,000 Units by mouth daily.        Allergies:  Allergies  Allergen Reactions   Losartan Shortness Of Breath    Family History: Family History  Problem Relation Age of Onset   Stroke Mother        Low BP stroke   Depression Father    Depression Sister        Breast   Heart disease Other    Breast cancer Other    Lung cancer Other    Ovarian cancer Other    Stomach cancer Other     Social History:  reports that he quit smoking about 4 months ago. His smoking use included cigarettes. He has a 23.50 pack-year smoking history. He has never used smokeless tobacco. He reports current alcohol use. He reports that he does not use drugs.   Physical Exam: BP 109/66 (BP Location: Left Arm, Patient Position: Sitting, Cuff Size: Normal)   Pulse 94   Ht '5\' 7"'$  (1.702 m)   Wt 152 lb (68.9 kg)   BMI 23.81 kg/m   Constitutional:  Alert, No acute distress. HEENT: [place] AT, moist mucus membranes.  Trachea midline, no masses. Respiratory: Normal respiratory effort, no increased work of breathing. Psychiatric: Normal mood and affect.   Assessment & Plan:    1.  BPH with lower urinary tract symptoms PVR today 33  mL Has stopped Myrbetriq and voiding symptoms stable on tamsulosin Follow-up prn   Abbie Sons, MD  [facility] [address], [place], [place] [address] [phone]

## 2022-02-08 DIAGNOSIS — N186 End stage renal disease: Secondary | ICD-10-CM | POA: Diagnosis not present

## 2022-02-08 DIAGNOSIS — Z992 Dependence on renal dialysis: Secondary | ICD-10-CM | POA: Diagnosis not present

## 2022-02-09 DIAGNOSIS — N186 End stage renal disease: Secondary | ICD-10-CM | POA: Diagnosis not present

## 2022-02-09 DIAGNOSIS — Z992 Dependence on renal dialysis: Secondary | ICD-10-CM | POA: Diagnosis not present

## 2022-02-10 DIAGNOSIS — N186 End stage renal disease: Secondary | ICD-10-CM | POA: Diagnosis not present

## 2022-02-10 DIAGNOSIS — Z992 Dependence on renal dialysis: Secondary | ICD-10-CM | POA: Diagnosis not present

## 2022-02-11 DIAGNOSIS — N186 End stage renal disease: Secondary | ICD-10-CM | POA: Diagnosis not present

## 2022-02-11 DIAGNOSIS — Z992 Dependence on renal dialysis: Secondary | ICD-10-CM | POA: Diagnosis not present

## 2022-02-12 DIAGNOSIS — N186 End stage renal disease: Secondary | ICD-10-CM | POA: Diagnosis not present

## 2022-02-12 DIAGNOSIS — Z992 Dependence on renal dialysis: Secondary | ICD-10-CM | POA: Diagnosis not present

## 2022-02-13 DIAGNOSIS — Z992 Dependence on renal dialysis: Secondary | ICD-10-CM | POA: Diagnosis not present

## 2022-02-13 DIAGNOSIS — N186 End stage renal disease: Secondary | ICD-10-CM | POA: Diagnosis not present

## 2022-02-14 DIAGNOSIS — N186 End stage renal disease: Secondary | ICD-10-CM | POA: Diagnosis not present

## 2022-02-14 DIAGNOSIS — Z992 Dependence on renal dialysis: Secondary | ICD-10-CM | POA: Diagnosis not present

## 2022-02-15 DIAGNOSIS — Z992 Dependence on renal dialysis: Secondary | ICD-10-CM | POA: Diagnosis not present

## 2022-02-15 DIAGNOSIS — N186 End stage renal disease: Secondary | ICD-10-CM | POA: Diagnosis not present

## 2022-02-16 DIAGNOSIS — N186 End stage renal disease: Secondary | ICD-10-CM | POA: Diagnosis not present

## 2022-02-16 DIAGNOSIS — Z992 Dependence on renal dialysis: Secondary | ICD-10-CM | POA: Diagnosis not present

## 2022-02-17 DIAGNOSIS — N186 End stage renal disease: Secondary | ICD-10-CM | POA: Diagnosis not present

## 2022-02-17 DIAGNOSIS — Z992 Dependence on renal dialysis: Secondary | ICD-10-CM | POA: Diagnosis not present

## 2022-02-18 DIAGNOSIS — N186 End stage renal disease: Secondary | ICD-10-CM | POA: Diagnosis not present

## 2022-02-18 DIAGNOSIS — Z992 Dependence on renal dialysis: Secondary | ICD-10-CM | POA: Diagnosis not present

## 2022-02-19 DIAGNOSIS — N186 End stage renal disease: Secondary | ICD-10-CM | POA: Diagnosis not present

## 2022-02-19 DIAGNOSIS — Z992 Dependence on renal dialysis: Secondary | ICD-10-CM | POA: Diagnosis not present

## 2022-02-20 DIAGNOSIS — Z992 Dependence on renal dialysis: Secondary | ICD-10-CM | POA: Diagnosis not present

## 2022-02-20 DIAGNOSIS — N186 End stage renal disease: Secondary | ICD-10-CM | POA: Diagnosis not present

## 2022-02-21 DIAGNOSIS — N186 End stage renal disease: Secondary | ICD-10-CM | POA: Diagnosis not present

## 2022-02-21 DIAGNOSIS — Z992 Dependence on renal dialysis: Secondary | ICD-10-CM | POA: Diagnosis not present

## 2022-02-22 DIAGNOSIS — Z992 Dependence on renal dialysis: Secondary | ICD-10-CM | POA: Diagnosis not present

## 2022-02-22 DIAGNOSIS — N186 End stage renal disease: Secondary | ICD-10-CM | POA: Diagnosis not present

## 2022-02-23 DIAGNOSIS — N186 End stage renal disease: Secondary | ICD-10-CM | POA: Diagnosis not present

## 2022-02-23 DIAGNOSIS — Z992 Dependence on renal dialysis: Secondary | ICD-10-CM | POA: Diagnosis not present

## 2022-02-24 ENCOUNTER — Encounter: Payer: Self-pay | Admitting: Internal Medicine

## 2022-02-24 DIAGNOSIS — Z992 Dependence on renal dialysis: Secondary | ICD-10-CM | POA: Diagnosis not present

## 2022-02-24 DIAGNOSIS — N186 End stage renal disease: Secondary | ICD-10-CM | POA: Diagnosis not present

## 2022-02-24 NOTE — Progress Notes (Signed)
I spoke to [place] patient's wife. Patient complained of significant fatigue. As per Wife.- hemoglobin Nephrology- 7.1 as for the wife. Will have patient evaluate in the clinic tomorrow [facility]. I'm  GB

## 2022-02-25 ENCOUNTER — Other Ambulatory Visit: Payer: Medicare HMO

## 2022-02-25 ENCOUNTER — Inpatient Hospital Stay: Payer: Medicare HMO | Attending: Internal Medicine | Admitting: Hospice and Palliative Medicine

## 2022-02-25 ENCOUNTER — Inpatient Hospital Stay: Payer: Medicare HMO

## 2022-02-25 VITALS — BP 100/63 | HR 74 | Temp 98.1°F | Resp 16

## 2022-02-25 DIAGNOSIS — D649 Anemia, unspecified: Secondary | ICD-10-CM

## 2022-02-25 DIAGNOSIS — D7581 Myelofibrosis: Secondary | ICD-10-CM

## 2022-02-25 DIAGNOSIS — D471 Chronic myeloproliferative disease: Secondary | ICD-10-CM

## 2022-02-25 DIAGNOSIS — I132 Hypertensive heart and chronic kidney disease with heart failure and with stage 5 chronic kidney disease, or end stage renal disease: Secondary | ICD-10-CM | POA: Diagnosis not present

## 2022-02-25 DIAGNOSIS — Z992 Dependence on renal dialysis: Secondary | ICD-10-CM | POA: Diagnosis not present

## 2022-02-25 DIAGNOSIS — E1122 Type 2 diabetes mellitus with diabetic chronic kidney disease: Secondary | ICD-10-CM | POA: Diagnosis not present

## 2022-02-25 DIAGNOSIS — J449 Chronic obstructive pulmonary disease, unspecified: Secondary | ICD-10-CM | POA: Diagnosis not present

## 2022-02-25 DIAGNOSIS — N186 End stage renal disease: Secondary | ICD-10-CM | POA: Diagnosis not present

## 2022-02-25 LAB — LACTATE DEHYDROGENASE: LDH: 461 U/L — ABNORMAL HIGH (ref 98–192)

## 2022-02-25 LAB — COMPREHENSIVE METABOLIC PANEL
ALT: 8 U/L (ref 0–44)
AST: 12 U/L — ABNORMAL LOW (ref 15–41)
Albumin: 2.9 g/dL — ABNORMAL LOW (ref 3.5–5.0)
Alkaline Phosphatase: 44 U/L (ref 38–126)
Anion gap: 13 (ref 5–15)
BUN: 69 mg/dL — ABNORMAL HIGH (ref 8–23)
CO2: 24 mmol/L (ref 22–32)
Calcium: 7.7 mg/dL — ABNORMAL LOW (ref 8.9–10.3)
Chloride: 101 mmol/L (ref 98–111)
Creatinine, Ser: 7.23 mg/dL — ABNORMAL HIGH (ref 0.61–1.24)
GFR, Estimated: 8 mL/min — ABNORMAL LOW (ref >60)
Glucose, Bld: 140 mg/dL — ABNORMAL HIGH (ref 70–99)
Potassium: 3.8 mmol/L (ref 3.5–5.1)
Sodium: 138 mmol/L (ref 135–145)
Total Bilirubin: 0.4 mg/dL (ref 0.3–1.2)
Total Protein: 6 g/dL — ABNORMAL LOW (ref 6.5–8.1)

## 2022-02-25 LAB — CBC WITH DIFFERENTIAL/PLATELET
Abs Immature Granulocytes: 0.03 10*3/uL (ref 0.00–0.07)
Basophils Absolute: 0 10*3/uL (ref 0.0–0.1)
Basophils Relative: 0 %
Eosinophils Absolute: 0 10*3/uL (ref 0.0–0.5)
Eosinophils Relative: 0 %
HCT: 22.9 % — ABNORMAL LOW (ref 39.0–52.0)
Hemoglobin: 7.1 g/dL — ABNORMAL LOW (ref 13.0–17.0)
Immature Granulocytes: 1 %
Lymphocytes Relative: 6 %
Lymphs Abs: 0.3 10*3/uL — ABNORMAL LOW (ref 0.7–4.0)
MCH: 28.9 pg (ref 26.0–34.0)
MCHC: 31 g/dL (ref 30.0–36.0)
MCV: 93.1 fL (ref 80.0–100.0)
Monocytes Absolute: 0.2 10*3/uL (ref 0.1–1.0)
Monocytes Relative: 4 %
Neutro Abs: 4.9 10*3/uL (ref 1.7–7.7)
Neutrophils Relative %: 89 %
Platelets: 148 10*3/uL — ABNORMAL LOW (ref 150–400)
RBC: 2.46 MIL/uL — ABNORMAL LOW (ref 4.22–5.81)
RDW: 16.1 % — ABNORMAL HIGH (ref 11.5–15.5)
WBC: 5.5 10*3/uL (ref 4.0–10.5)
nRBC: 0 % (ref 0.0–0.2)

## 2022-02-25 LAB — SAMPLE TO BLOOD BANK

## 2022-02-25 NOTE — Progress Notes (Signed)
Pt reports weakness and fatigue for approximately 1 month. Hemoglobin at nephrology on 6/1 was 7.2, and received an injection of retacrit. He also reports occasional dizziness and shortness of breath.

## 2022-02-25 NOTE — Progress Notes (Signed)
Symptom Management [place]  Telephone:(336) [phone] Fax:([phone]) [phone]  Patient Care Team: James Roys, DO as PCP - General (Family Medicine) End, James Gave, MD as PCP - Cardiology (Cardiology) Cammie Sickle, MD as Consulting Physician (Internal Medicine) Dasher, James Char, MD as Consulting Physician (Dermatology) End, James Gave, MD as Consulting Physician (Cardiology) James Legato, MD (Nephrology)   Name of the patient: James Arnold  [id number]  1956/01/23   Date of visit: 02/25/22  Reason for Consult: 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  Patient last saw Dr. Rogue Arnold on 11/25/2021.  James Arnold had been discontinued due to poor tolerance.  Patient has been symptomatic from myelofibrosis and worsening anemia.  Patient/wife had declined further therapies for myelofibrosis.  Patient was hospitalized in February 2023 due to hyperkalemia, vertigo, and symptomatic anemia.  He was referred for outpatient ENT work-up.  Patient was hospitalized again in April 2023 with CHF exacerbation.  Patient's wife spoke with Dr. Rogue Arnold yesterday with complaint of severe fatigue.  Reportedly, hemoglobin was 7.1 at nephrology office.  Patient was seen today in [facility] for follow up.   Today, patient reports that he has felt progressive fatigue over the past month.  He denies significant other distressing symptoms.  He has had some loose stools but no watery diarrhea.  No fever or chills.  He reports peritoneal dialysis is still going well, which she does daily.  He is also on daily furosemide.  He continues to endorse chronic vertigo but says that he did not have much improvement with medication management after seeing the  ENT.  Denies any neurologic complaints. Denies recent fevers or illnesses. Denies any easy bleeding or bruising. Reports fair appetite. Denies chest pain. Denies any nausea, vomiting, constipation, or diarrhea. Denies urinary complaints. Patient offers no further specific complaints today.  PAST MEDICAL HISTORY: Past Medical History:  Diagnosis Date   Anemia    Benign hypertensive renal disease    CAD (coronary artery disease)    a. 08/1996 s/p BMS to the mLAD ([facility]); b. 12/2017 MV: EF 46%, --> low risk; c. 07/2020 MV: EF 51%, no ischemia/infarct; d. 08/2021 Cath/PCI: LM nl, LAD 60p, 95p/m ISR (3.0x48 Synergy DES), RI small, nl, LCX 40p, 95d(3.5x16 Synergy DES), OM1 50, RCA 40p/15m   Carotid arterial disease ([place]    a. 11/2020 Carotid U/S: <50% bilat ICA stenoses.   CHF (congestive heart failure) (HCC)    Chronic HFrEF (heart failure with reduced ejection fraction) ([place]    a. 08/2022 Echo: EF 35-40%.   CKD (chronic kidney disease), stage V ([facility]    on peritoneal dialysis   Complication of anesthesia    please call by "James Arnold" when waking up!!   COPD (chronic obstructive pulmonary disease) (HCC)    centrilobular emphysema. pt is poorly controlled with his copd/smoking.   Depression    Diastolic dysfunction    a. 12/2017 Echo: EF 60-65%, no rwma, Gr1 DD, mild AI/MR. Nl RVSP; b. 08/2020 Echo: EF 60-65%, no rwma, GrI DD, nl RV fxn. RVSP 32.540mg.   Dyspnea on exertion    with exertion   Fatigue    FSGS (focal segmental glomerulosclerosis)    Hyperlipidemia 10/2002   Hypertension    Ischemic cardiomyopathy    a. 08/2022 Echo: EF 35-40%, glob HK, GrII DD,  nl RV fxn, mildly dil LA, mild MR, mild AS.   myelofibrosis    bone marrow failure   Myelofibrosis ([place]) 2010   JAK2 (+) primary   Myocardial infarction ([place]) 1997   stent x 1   Pneumoperitoneum 02/2021   PSVT (paroxysmal supraventricular tachycardia) ([place])    a. 12/2020 Zio: Sinus rhythm 80 (55-117). Rare PACs/PVCs. 17 episodes  of SVT (longest 20.3 secs, fastest 184). No triggered events. No afib.   Smoker    Stroke [facility])    a. 11/2020 MRI Brain: patchy acute/early subacute cortical infarcts w/in the R frontal, parietal, and occiptal lobes. Small, chronic L basal ganglia lacunar infarct.   Thrombocytopenia ([place])     PAST SURGICAL HISTORY:  Past Surgical History:  Procedure Laterality Date   ANGIOPLASTY     BONE MARROW ASPIRATION  03/2009   CARDIAC CATHETERIZATION  1997   stent x 1   COLONOSCOPY WITH PROPOFOL N/A 08/09/2018   Procedure: COLONOSCOPY WITH PROPOFOL;  Surgeon: [name], MD;  Location[facility] ENDOSCOPY;  Service: Gastroenterology;  Laterality: N/A;   CORONARY ARTERY BYPASS GRAFT     CORONARY STENT PLACEMENT  08/1996   ESOPHAGOGASTRODUODENOSCOPY (EGD) WITH PROPOFOL N/A 07/25/2021   Procedure: ESOPHAGOGASTRODUODENOSCOPY (EGD) WITH PROPOFOL;  Surgeon: [name], MD;  Location: [facility] ENDOSCOPY;  Service: Gastroenterology;  Laterality: N/A;   EXTERIORIZATION OF A CONTINUOUS AMBULATORY PERITONEAL DIALYSIS CATHETER     EYE SURGERY Bilateral    cats removed   LEFT HEART CATH AND CORONARY ANGIOGRAPHY N/A 09/12/2021   Procedure: LEFT HEART CATH AND CORONARY ANGIOGRAPHY;  Surgeon: Wellington Hampshire, MD;  Location: [place] CV LAB;  Service: Cardiovascular;  Laterality: N/A;   REVERSE SHOULDER ARTHROPLASTY Left 11/19/2020   Procedure: Left reverse shoulder arthroplasty;  Surgeon: Leim Fabry, MD;  Location: [facility] ORS;  Service: Orthopedics;  Laterality: Left;    HEMATOLOGY/ONCOLOGY HISTORY:  Oncology History Overview Note  # 2010- PRIMARY MYELOFIBROSIS; Jak-2 positive;cytogenetics- Not done [bmbx- 2010] 2010- spleen- 13cm; Dynamic IPS- LOW [0-risk factor]; [place] 2017 AUG spleen-13cm; March 2019-bone marrow biopsy myelofibrosis/no blasts.   # AUGUST 1st week 2019- Retacrit   # October 2nd 2019- jakafi 10 BID [? Renal involvement]; September 2020-peritoneal dialysis; Jakafi 10 mg in the morning;  STOPPED/tapered July8th, 2021 [sec intolerance fatigue/myalgias]  # May 4th 2022- START James Arnold Neysa Hotter dosing/PD]; STOPPED after2-3 weeks-because of poor tolerance [sever fatigue/? anemia]; NOV 2022-declined Hydrea.  # DEC 2022- NSTEMI- sever 3 vessel CAD- transferred to [facility].  Patient underwent cardiac catheterization/stenting.  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 [name]- kidney Bx- FSG [ on Prednisone Dr.[name]]; July 2020-peritoneal dialysis;   # hx of Lung nodules- resolved [[name]]/quit smoking.  DIAGNOSIS: PRIMARY MYELOFIBROSIS  RISK:LOW     ;GOALS: Control  CURRENT/MOST RECENT THERAPY : Surveillance    Myelofibrosis ([place])    ALLERGIES:  is allergic to losartan.  MEDICATIONS:  Current Outpatient Medications  Medication Sig Dispense Refill   acetaminophen (TYLENOL) 500 MG tablet Take 500 mg by mouth every 6 (six) hours as needed for headache, fever, moderate pain or mild pain.     albuterol (VENTOLIN HFA) 108 (90 Base) MCG/ACT inhaler Inhale 2 puffs into the lungs every 6 (six) hours as needed for wheezing or shortness of breath. 8 g 2   allopurinol (ZYLOPRIM) 100 MG tablet Take 100 mg by mouth daily.     aspirin 81 MG chewable tablet Chew 1  tablet (81 mg total) by mouth daily. 90 tablet 1   carvedilol (COREG) 12.5 MG tablet Take 1 tablet (12.5 mg total) by mouth 2 (two) times daily. 180 tablet 3   Cholecalciferol (VITAMIN D-1000 MAX ST) 25 MCG (1000 UT) tablet Take 1,000 Units by mouth daily.      clopidogrel (PLAVIX) 75 MG tablet Take 75 mg by mouth daily.     DULoxetine (CYMBALTA) 60 MG capsule Take 1 capsule (60 mg total) by mouth daily. 90 capsule 1   ferrous sulfate 325 (65 FE) MG tablet Take 1 tablet (325 mg total) by mouth daily with breakfast. 30 tablet 1   furosemide (LASIX) 80 MG tablet Take 1 tablet (80 mg total) by mouth daily. Increased from 20 mg daily. 30  tablet 2   gentamicin cream (GARAMYCIN) 0.1 % Apply 1 application topically every other day.     levETIRAcetam (KEPPRA) 500 MG tablet Take 1 tablet (500 mg total) by mouth at bedtime. 30 tablet 0   meclizine (ANTIVERT) 12.5 MG tablet Take 1 tablet (12.5 mg total) by mouth 2 (two) times daily as needed for dizziness. 30 tablet 2   nitroGLYCERIN (NITROSTAT) 0.4 MG SL tablet Place 1 tablet (0.4 mg total) under the tongue every 5 (five) minutes as needed for chest pain.  12   nortriptyline (PAMELOR) 25 MG capsule Take 1 capsule (25 mg total) by mouth at bedtime. 90 capsule 1   pantoprazole (PROTONIX) 20 MG tablet Take 20 mg by mouth daily.     predniSONE (DELTASONE) 10 MG tablet Take 10 mg by mouth daily with breakfast.     tamsulosin (FLOMAX) 0.4 MG CAPS capsule Take 2 capsules (0.8 mg total) by mouth daily. 60 capsule 3   albuterol (PROVENTIL) (2.5 MG/3ML) 0.083% nebulizer solution Take 3 mLs (2.5 mg total) by nebulization every 6 (six) hours as needed for wheezing or shortness of breath. 150 mL 1   atorvastatin (LIPITOR) 40 MG tablet Take 1 tablet (40 mg total) by mouth daily. 90 tablet 3   polyethylene glycol (MIRALAX / GLYCOLAX) 17 g packet Take 17 g by mouth daily as needed. (Patient not taking: Reported on 02/25/2022) 14 each 0   SPS 15 GM/60ML suspension Take by mouth. (Patient not taking: Reported on 02/25/2022)     No current facility-administered medications for this visit.    VITAL SIGNS: BP 100/63   Pulse 74   Temp 98.1 F (36.7 C)   Resp 16   SpO2 100%  There were no vitals filed for this visit.   Estimated body mass index is 23.81 kg/m as calculated from the following:   Height as of 02/07/22: '5\' 7"'  (1.702 m).   Weight as of 02/07/22: 152 lb (68.9 kg).  LABS: CBC:    Component Value Date/Time   WBC 5.5 02/25/2022 1257   HGB 7.1 (L) 02/25/2022 1257   HGB 8.8 (L) 12/31/2021 1049   HCT 22.9 (L) 02/25/2022 1257   HCT 27.2 (L) 12/31/2021 1049   PLT 148 (L) 02/25/2022 1257    PLT 155 12/31/2021 1049   MCV 93.1 02/25/2022 1257   MCV 90 12/31/2021 1049   MCV 87 05/12/2014 1038   NEUTROABS 4.9 02/25/2022 1257   NEUTROABS 5.9 12/31/2021 1049   NEUTROABS 4.0 05/12/2014 1038   LYMPHSABS 0.3 (L) 02/25/2022 1257   LYMPHSABS 0.3 (L) 12/31/2021 1049   LYMPHSABS 1.0 05/12/2014 1038   MONOABS 0.2 02/25/2022 1257   MONOABS 0.2 05/12/2014 1038   EOSABS 0.0 02/25/2022  1257   EOSABS 0.0 12/31/2021 1049   EOSABS 0.0 05/12/2014 1038   BASOSABS 0.0 02/25/2022 1257   BASOSABS 0.0 12/31/2021 1049   BASOSABS 0.0 05/12/2014 1038   Comprehensive Metabolic Panel:    Component Value Date/Time   NA 143 01/30/2022 0954   K 4.4 01/30/2022 0954   CL 102 01/30/2022 0954   CO2 20 01/30/2022 0954   BUN 60 (H) 01/30/2022 0954   CREATININE 6.81 (H) 01/30/2022 0954   CREATININE 1.12 09/30/2013 0953   GLUCOSE 125 (H) 01/30/2022 0954   GLUCOSE 82 12/24/2021 0355   CALCIUM 7.7 (L) 01/30/2022 0954   AST 15 12/22/2021 1152   ALT 12 12/22/2021 1152   ALKPHOS 56 12/22/2021 1152   BILITOT 0.8 12/22/2021 1152   BILITOT 0.5 11/18/2021 1013   PROT 6.1 (L) 12/22/2021 1152   PROT 5.6 (L) 11/18/2021 1013   ALBUMIN 3.3 (L) 12/22/2021 1152   ALBUMIN 3.5 (L) 11/18/2021 1013    RADIOGRAPHIC STUDIES: No results found.  PERFORMANCE STATUS (ECOG) : 2 - Symptomatic, <50% confined to bed  Review of Systems Unless otherwise noted, a complete review of systems is negative.  Physical Exam General: NAD Cardiovascular: regular rate and rhythm Pulmonary: Coarse, expiratory wheezing anterior/posterior fields Abdomen: soft, nontender, + bowel sounds GU: no suprapubic tenderness Extremities: no edema, no joint deformities Skin: no rashes Neurological: Weakness but otherwise nonfocal  Assessment and Plan- Patient is a 66 y.o. male COPD, ESRD on peritoneal dialysis, and myelofibrosis on Jakafi, who presents to the [facility] today for evaluation of anemia  Symptomatic anemia-likely secondary to  myelofibrosis.  Discussed goals and patient states that he is still not interested in treatment as he felt worse on treatment than without.  Continue supportive care and surveillance.  Discussed with Dr. Rogue Arnold and will proceed with transfusion 1 unit PRBC with 20 mg of furosemide.  Patient to follow-up in 2 weeks with NP/MD   Case and plan discussed with Drs. Brahmanday   Patient expressed understanding and was in agreement with this plan. He also understands that He can call clinic at any time with any questions, concerns, or complaints.   Thank you for allowing me to participate in the care of this very pleasant patient.   Time Total: 15 minutes  Visit consisted of counseling and education dealing with the complex and emotionally intense issues of symptom management and palliative care in the setting of serious and potentially life-threatening illness.Greater than 50%  of this time was spent counseling and coordinating care related to the above assessment and plan.  Signed by: Altha Harm, PhD, NP-C

## 2022-02-26 ENCOUNTER — Other Ambulatory Visit: Payer: Medicare HMO

## 2022-02-26 DIAGNOSIS — Z992 Dependence on renal dialysis: Secondary | ICD-10-CM | POA: Diagnosis not present

## 2022-02-26 DIAGNOSIS — N186 End stage renal disease: Secondary | ICD-10-CM | POA: Diagnosis not present

## 2022-02-26 LAB — PREPARE RBC (CROSSMATCH)

## 2022-02-27 ENCOUNTER — Inpatient Hospital Stay: Payer: Medicare HMO

## 2022-02-27 DIAGNOSIS — D471 Chronic myeloproliferative disease: Secondary | ICD-10-CM | POA: Diagnosis not present

## 2022-02-27 DIAGNOSIS — D7581 Myelofibrosis: Secondary | ICD-10-CM

## 2022-02-27 DIAGNOSIS — Z992 Dependence on renal dialysis: Secondary | ICD-10-CM | POA: Diagnosis not present

## 2022-02-27 DIAGNOSIS — N186 End stage renal disease: Secondary | ICD-10-CM | POA: Diagnosis not present

## 2022-02-27 DIAGNOSIS — I132 Hypertensive heart and chronic kidney disease with heart failure and with stage 5 chronic kidney disease, or end stage renal disease: Secondary | ICD-10-CM | POA: Diagnosis not present

## 2022-02-27 DIAGNOSIS — D649 Anemia, unspecified: Secondary | ICD-10-CM | POA: Diagnosis not present

## 2022-02-27 DIAGNOSIS — E1122 Type 2 diabetes mellitus with diabetic chronic kidney disease: Secondary | ICD-10-CM | POA: Diagnosis not present

## 2022-02-27 MED ORDER — SODIUM CHLORIDE 0.9% IV SOLUTION
250.0000 mL | Freq: Once | INTRAVENOUS | Status: AC
  Administered 2022-02-27: 250 mL via INTRAVENOUS
  Filled 2022-02-27: qty 250

## 2022-02-27 MED ORDER — FUROSEMIDE 10 MG/ML IJ SOLN
20.0000 mg | Freq: Once | INTRAMUSCULAR | Status: AC
  Administered 2022-02-27: 20 mg via INTRAVENOUS
  Filled 2022-02-27: qty 2

## 2022-02-27 NOTE — Patient Instructions (Signed)
[facility]  Discharge Instructions: ?Thank you for choosing [facility] to provide your oncology and hematology care.  ?If you have a lab appointment with the Cancer Center, please go directly to the Cancer Center and check in at the registration area. ? ?Wear comfortable clothing and clothing appropriate for easy access to any Portacath or PICC line.  ? ?We strive to give you quality time with your provider. You may need to reschedule your appointment if you arrive late (15 or more minutes).  Arriving late affects you and other patients whose appointments are after yours.  Also, if you miss three or more appointments without notifying the office, you may be dismissed from the clinic at the provider?s discretion.    ?  ?For prescription refill requests, have your pharmacy contact our office and allow 72 hours for refills to be completed.   ? ?Today you received the following chemotherapy and/or immunotherapy agents ONE UNIT BLOOD    ?  ?To help prevent nausea and vomiting after your treatment, we encourage you to take your nausea medication as directed. ? ?BELOW ARE SYMPTOMS THAT SHOULD BE REPORTED IMMEDIATELY: ?*FEVER GREATER THAN 100.4 F (38 ?C) OR HIGHER ?*CHILLS OR SWEATING ?*NAUSEA AND VOMITING THAT IS NOT CONTROLLED WITH YOUR NAUSEA MEDICATION ?*UNUSUAL SHORTNESS OF BREATH ?*UNUSUAL BRUISING OR BLEEDING ?*URINARY PROBLEMS (pain or burning when urinating, or frequent urination) ?*BOWEL PROBLEMS (unusual diarrhea, constipation, pain near the anus) ?TENDERNESS IN MOUTH AND THROAT WITH OR WITHOUT PRESENCE OF ULCERS (sore throat, sores in mouth, or a toothache) ?UNUSUAL RASH, SWELLING OR PAIN  ?UNUSUAL VAGINAL DISCHARGE OR ITCHING  ? ?Items with * indicate a potential emergency and should be followed up as soon as possible or go to the Emergency Department if any problems should occur. ? ?Please show the CHEMOTHERAPY ALERT CARD or IMMUNOTHERAPY ALERT CARD at check-in  to the Emergency Department and triage nurse. ? ?Should you have questions after your visit or need to cancel or reschedule your appointment, please contact [facility]  [phone] and follow the prompts.  Office hours are 8:00 a.m. to 4:30 p.m. Monday - Friday. Please note that voicemails left after 4:00 p.m. may not be returned until the following business day.  We are closed weekends and major holidays. You have access to a nurse at all times for urgent questions. Please call the main number to the clinic [phone] and follow the prompts. ? ?For any non-urgent questions, you may also contact your provider using MyChart. We now offer e-Visits for anyone 18 and older to request care online for non-urgent symptoms. For details visit [email or web address]. ?  ?Also download the MyChart app! Go to the app store, search "MyChart", open the app, select [facility], and log in with your MyChart username and password. ? ?Due to Covid, a mask is required upon entering the hospital/clinic. If you do not have a mask, one will be given to you upon arrival. For doctor visits, patients may have 1 support person aged 18 or older with them. For treatment visits, patients cannot have anyone with them due to current Covid guidelines and our immunocompromised population.  ? ?Blood Transfusion, Adult, Care After ?This sheet gives you information about how to care for yourself after your procedure. Your doctor may also give you more specific instructions. If you have problems or questions, contact your doctor. ?What can I expect after the procedure? ?After the procedure, it is common to have: ?Bruising   and soreness at the IV site. ?A headache. ?Follow these instructions at home: ?Insertion site care ? ?  ? ?Follow instructions from your doctor about how to take care of your insertion site. This is where an IV tube was put into your vein. Make sure you: ?Wash your hands with soap and water before  and after you change your bandage (dressing). If you cannot use soap and water, use hand sanitizer. ?Change your bandage as told by your doctor. ?Check your insertion site every day for signs of infection. Check for: ?Redness, swelling, or pain. ?Bleeding from the site. ?Warmth. ?Pus or a bad smell. ?General instructions ?Take over-the-counter and prescription medicines only as told by your doctor. ?Rest as told by your doctor. ?Go back to your normal activities as told by your doctor. ?Keep all follow-up visits as told by your doctor. This is important. ?Contact a doctor if: ?You have itching or red, swollen areas of skin (hives). ?You feel worried or nervous (anxious). ?You feel weak after doing your normal activities. ?You have redness, swelling, warmth, or pain around the insertion site. ?You have blood coming from the insertion site, and the blood does not stop with pressure. ?You have pus or a bad smell coming from the insertion site. ?Get help right away if: ?You have signs of a serious reaction. This may be coming from an allergy or the body's defense system (immune system). Signs include: ?Trouble breathing or shortness of breath. ?Swelling of the face or feeling warm (flushed). ?Fever or chills. ?Head, chest, or back pain. ?Dark pee (urine) or blood in the pee. ?Widespread rash. ?Fast heartbeat. ?Feeling dizzy or light-headed. ?You may receive your blood transfusion in an outpatient setting. If so, you will be told whom to contact to report any reactions. ?These symptoms may be an emergency. Do not wait to see if the symptoms will go away. Get medical help right away. Call your local emergency services (911 in the [place] Do not drive yourself to the hospital. ?Summary ?Bruising and soreness at the IV site are common. ?Check your insertion site every day for signs of infection. ?Rest as told by your doctor. Go back to your normal activities as told by your doctor. ?Get help right away if you have signs of a  serious reaction. ?This information is not intended to replace advice given to you by your health care provider. Make sure you discuss any questions you have with your health care provider. ?Document Revised: 01/03/2021 Document Reviewed: 03/03/2019 ?Elsevier Patient Education ? 2023 Elsevier Inc. ? ?

## 2022-02-28 DIAGNOSIS — Z992 Dependence on renal dialysis: Secondary | ICD-10-CM | POA: Diagnosis not present

## 2022-02-28 DIAGNOSIS — N186 End stage renal disease: Secondary | ICD-10-CM | POA: Diagnosis not present

## 2022-02-28 LAB — TYPE AND SCREEN
ABO/RH(D): A POS
Antibody Screen: NEGATIVE
Unit division: 0

## 2022-02-28 LAB — BPAM RBC
Blood Product Expiration Date: 202306272359
ISSUE DATE / TIME: 202306080830
Unit Type and Rh: 6200

## 2022-03-01 DIAGNOSIS — N186 End stage renal disease: Secondary | ICD-10-CM | POA: Diagnosis not present

## 2022-03-01 DIAGNOSIS — Z992 Dependence on renal dialysis: Secondary | ICD-10-CM | POA: Diagnosis not present

## 2022-03-02 ENCOUNTER — Other Ambulatory Visit: Payer: Self-pay

## 2022-03-02 ENCOUNTER — Emergency Department: Payer: Medicare HMO

## 2022-03-02 ENCOUNTER — Inpatient Hospital Stay
Admission: EM | Admit: 2022-03-02 | Discharge: 2022-03-05 | DRG: 291 | Disposition: A | Discharge status: Other Acute Inpatient Hospital | Payer: Medicare HMO | Attending: Internal Medicine | Admitting: Internal Medicine

## 2022-03-02 DIAGNOSIS — E162 Hypoglycemia, unspecified: Secondary | ICD-10-CM | POA: Diagnosis not present

## 2022-03-02 DIAGNOSIS — Z951 Presence of aortocoronary bypass graft: Secondary | ICD-10-CM | POA: Diagnosis not present

## 2022-03-02 DIAGNOSIS — I5023 Acute on chronic systolic (congestive) heart failure: Secondary | ICD-10-CM | POA: Diagnosis not present

## 2022-03-02 DIAGNOSIS — R0603 Acute respiratory distress: Secondary | ICD-10-CM | POA: Diagnosis present

## 2022-03-02 DIAGNOSIS — T8602 Bone marrow transplant failure: Secondary | ICD-10-CM | POA: Diagnosis present

## 2022-03-02 DIAGNOSIS — R778 Other specified abnormalities of plasma proteins: Secondary | ICD-10-CM | POA: Diagnosis present

## 2022-03-02 DIAGNOSIS — E876 Hypokalemia: Secondary | ICD-10-CM | POA: Diagnosis not present

## 2022-03-02 DIAGNOSIS — I509 Heart failure, unspecified: Secondary | ICD-10-CM | POA: Diagnosis not present

## 2022-03-02 DIAGNOSIS — E161 Other hypoglycemia: Secondary | ICD-10-CM | POA: Diagnosis not present

## 2022-03-02 DIAGNOSIS — I2510 Atherosclerotic heart disease of native coronary artery without angina pectoris: Secondary | ICD-10-CM | POA: Diagnosis present

## 2022-03-02 DIAGNOSIS — N2581 Secondary hyperparathyroidism of renal origin: Secondary | ICD-10-CM | POA: Diagnosis present

## 2022-03-02 DIAGNOSIS — J432 Centrilobular emphysema: Secondary | ICD-10-CM | POA: Diagnosis present

## 2022-03-02 DIAGNOSIS — Z515 Encounter for palliative care: Secondary | ICD-10-CM

## 2022-03-02 DIAGNOSIS — J810 Acute pulmonary edema: Secondary | ICD-10-CM | POA: Diagnosis present

## 2022-03-02 DIAGNOSIS — I132 Hypertensive heart and chronic kidney disease with heart failure and with stage 5 chronic kidney disease, or end stage renal disease: Principal | ICD-10-CM | POA: Diagnosis present

## 2022-03-02 DIAGNOSIS — R079 Chest pain, unspecified: Secondary | ICD-10-CM | POA: Diagnosis not present

## 2022-03-02 DIAGNOSIS — Z87891 Personal history of nicotine dependence: Secondary | ICD-10-CM

## 2022-03-02 DIAGNOSIS — E785 Hyperlipidemia, unspecified: Secondary | ICD-10-CM | POA: Diagnosis present

## 2022-03-02 DIAGNOSIS — Z8673 Personal history of transient ischemic attack (TIA), and cerebral infarction without residual deficits: Secondary | ICD-10-CM

## 2022-03-02 DIAGNOSIS — R069 Unspecified abnormalities of breathing: Secondary | ICD-10-CM | POA: Diagnosis not present

## 2022-03-02 DIAGNOSIS — Z7952 Long term (current) use of systemic steroids: Secondary | ICD-10-CM

## 2022-03-02 DIAGNOSIS — I080 Rheumatic disorders of both mitral and aortic valves: Secondary | ICD-10-CM | POA: Diagnosis present

## 2022-03-02 DIAGNOSIS — R0689 Other abnormalities of breathing: Secondary | ICD-10-CM | POA: Diagnosis not present

## 2022-03-02 DIAGNOSIS — Z955 Presence of coronary angioplasty implant and graft: Secondary | ICD-10-CM

## 2022-03-02 DIAGNOSIS — D631 Anemia in chronic kidney disease: Secondary | ICD-10-CM | POA: Diagnosis present

## 2022-03-02 DIAGNOSIS — I252 Old myocardial infarction: Secondary | ICD-10-CM | POA: Diagnosis not present

## 2022-03-02 DIAGNOSIS — D7581 Myelofibrosis: Secondary | ICD-10-CM | POA: Diagnosis not present

## 2022-03-02 DIAGNOSIS — I255 Ischemic cardiomyopathy: Secondary | ICD-10-CM | POA: Diagnosis present

## 2022-03-02 DIAGNOSIS — Z888 Allergy status to other drugs, medicaments and biological substances status: Secondary | ICD-10-CM

## 2022-03-02 DIAGNOSIS — I248 Other forms of acute ischemic heart disease: Secondary | ICD-10-CM | POA: Diagnosis not present

## 2022-03-02 DIAGNOSIS — J449 Chronic obstructive pulmonary disease, unspecified: Secondary | ICD-10-CM | POA: Diagnosis present

## 2022-03-02 DIAGNOSIS — Z96612 Presence of left artificial shoulder joint: Secondary | ICD-10-CM | POA: Diagnosis present

## 2022-03-02 DIAGNOSIS — Z7902 Long term (current) use of antithrombotics/antiplatelets: Secondary | ICD-10-CM

## 2022-03-02 DIAGNOSIS — I9589 Other hypotension: Secondary | ICD-10-CM | POA: Diagnosis present

## 2022-03-02 DIAGNOSIS — D649 Anemia, unspecified: Secondary | ICD-10-CM | POA: Diagnosis present

## 2022-03-02 DIAGNOSIS — R0602 Shortness of breath: Secondary | ICD-10-CM

## 2022-03-02 DIAGNOSIS — I5043 Acute on chronic combined systolic (congestive) and diastolic (congestive) heart failure: Secondary | ICD-10-CM | POA: Diagnosis present

## 2022-03-02 DIAGNOSIS — Z992 Dependence on renal dialysis: Secondary | ICD-10-CM

## 2022-03-02 DIAGNOSIS — R0682 Tachypnea, not elsewhere classified: Secondary | ICD-10-CM | POA: Diagnosis not present

## 2022-03-02 DIAGNOSIS — D471 Chronic myeloproliferative disease: Secondary | ICD-10-CM | POA: Diagnosis present

## 2022-03-02 DIAGNOSIS — Z823 Family history of stroke: Secondary | ICD-10-CM

## 2022-03-02 DIAGNOSIS — N186 End stage renal disease: Secondary | ICD-10-CM | POA: Diagnosis present

## 2022-03-02 DIAGNOSIS — Z7982 Long term (current) use of aspirin: Secondary | ICD-10-CM

## 2022-03-02 DIAGNOSIS — Z79899 Other long term (current) drug therapy: Secondary | ICD-10-CM

## 2022-03-02 LAB — COMPREHENSIVE METABOLIC PANEL
ALT: 11 U/L (ref 0–44)
AST: 13 U/L — ABNORMAL LOW (ref 15–41)
Albumin: 3.1 g/dL — ABNORMAL LOW (ref 3.5–5.0)
Alkaline Phosphatase: 61 U/L (ref 38–126)
Anion gap: 12 (ref 5–15)
BUN: 58 mg/dL — ABNORMAL HIGH (ref 8–23)
CO2: 25 mmol/L (ref 22–32)
Calcium: 7.7 mg/dL — ABNORMAL LOW (ref 8.9–10.3)
Chloride: 102 mmol/L (ref 98–111)
Creatinine, Ser: 7.29 mg/dL — ABNORMAL HIGH (ref 0.61–1.24)
GFR, Estimated: 8 mL/min — ABNORMAL LOW (ref >60)
Glucose, Bld: 152 mg/dL — ABNORMAL HIGH (ref 70–99)
Potassium: 3.5 mmol/L (ref 3.5–5.1)
Sodium: 139 mmol/L (ref 135–145)
Total Bilirubin: 0.8 mg/dL (ref 0.3–1.2)
Total Protein: 6.4 g/dL — ABNORMAL LOW (ref 6.5–8.1)

## 2022-03-02 LAB — CBC
HCT: 29.2 % — ABNORMAL LOW (ref 39.0–52.0)
Hemoglobin: 8.8 g/dL — ABNORMAL LOW (ref 13.0–17.0)
MCH: 28 pg (ref 26.0–34.0)
MCHC: 30.1 g/dL (ref 30.0–36.0)
MCV: 93 fL (ref 80.0–100.0)
Platelets: 170 10*3/uL (ref 150–400)
RBC: 3.14 MIL/uL — ABNORMAL LOW (ref 4.22–5.81)
RDW: 16.2 % — ABNORMAL HIGH (ref 11.5–15.5)
WBC: 7.5 10*3/uL (ref 4.0–10.5)
nRBC: 0 % (ref 0.0–0.2)

## 2022-03-02 LAB — TROPONIN I (HIGH SENSITIVITY): Troponin I (High Sensitivity): 308 ng/L (ref <18)

## 2022-03-02 LAB — BRAIN NATRIURETIC PEPTIDE: B Natriuretic Peptide: 1667.5 pg/mL — ABNORMAL HIGH (ref 0.0–100.0)

## 2022-03-02 MED ORDER — SODIUM CHLORIDE 0.9% FLUSH
3.0000 mL | Freq: Two times a day (BID) | INTRAVENOUS | Status: DC
  Administered 2022-03-02 – 2022-03-05 (×5): 3 mL via INTRAVENOUS

## 2022-03-02 MED ORDER — ALBUTEROL SULFATE (2.5 MG/3ML) 0.083% IN NEBU
2.5000 mg | INHALATION_SOLUTION | Freq: Four times a day (QID) | RESPIRATORY_TRACT | Status: DC
  Administered 2022-03-03 – 2022-03-04 (×6): 2.5 mg via RESPIRATORY_TRACT
  Filled 2022-03-02 (×7): qty 3

## 2022-03-02 MED ORDER — DULOXETINE HCL 30 MG PO CPEP
60.0000 mg | ORAL_CAPSULE | Freq: Every day | ORAL | Status: DC
  Administered 2022-03-03 – 2022-03-05 (×3): 60 mg via ORAL
  Filled 2022-03-02: qty 2
  Filled 2022-03-02: qty 1
  Filled 2022-03-02: qty 2

## 2022-03-02 MED ORDER — VITAMIN D 25 MCG (1000 UNIT) PO TABS
1000.0000 [IU] | ORAL_TABLET | Freq: Every day | ORAL | Status: DC
  Administered 2022-03-03 – 2022-03-05 (×3): 1000 [IU] via ORAL
  Filled 2022-03-02 (×3): qty 1

## 2022-03-02 MED ORDER — ATORVASTATIN CALCIUM 20 MG PO TABS
40.0000 mg | ORAL_TABLET | Freq: Every day | ORAL | Status: DC
  Administered 2022-03-03 – 2022-03-04 (×2): 40 mg via ORAL
  Filled 2022-03-02 (×2): qty 2

## 2022-03-02 MED ORDER — CARVEDILOL 6.25 MG PO TABS
12.5000 mg | ORAL_TABLET | Freq: Two times a day (BID) | ORAL | Status: DC
  Administered 2022-03-03: 12.5 mg via ORAL
  Filled 2022-03-02: qty 2

## 2022-03-02 MED ORDER — SODIUM CHLORIDE 0.9% FLUSH: 3.0000 mL | INTRAVENOUS | Status: DC | PRN

## 2022-03-02 MED ORDER — LEVETIRACETAM 500 MG PO TABS
500.0000 mg | ORAL_TABLET | Freq: Every day | ORAL | Status: DC
  Administered 2022-03-03 – 2022-03-04 (×3): 500 mg via ORAL
  Filled 2022-03-02 (×3): qty 1

## 2022-03-02 MED ORDER — ALBUTEROL SULFATE (2.5 MG/3ML) 0.083% IN NEBU: 2.5000 mg | INHALATION_SOLUTION | Freq: Four times a day (QID) | RESPIRATORY_TRACT | Status: DC | PRN

## 2022-03-02 MED ORDER — MORPHINE SULFATE (PF) 2 MG/ML IV SOLN
2.0000 mg | INTRAVENOUS | Status: DC | PRN
  Administered 2022-03-03 – 2022-03-04 (×3): 2 mg via INTRAVENOUS
  Filled 2022-03-02 (×3): qty 1

## 2022-03-02 MED ORDER — HEPARIN SODIUM (PORCINE) 5000 UNIT/ML IJ SOLN
5000.0000 [IU] | Freq: Three times a day (TID) | INTRAMUSCULAR | Status: DC
  Administered 2022-03-03 – 2022-03-05 (×8): 5000 [IU] via SUBCUTANEOUS
  Filled 2022-03-02 (×8): qty 1

## 2022-03-02 MED ORDER — FERROUS SULFATE 325 (65 FE) MG PO TABS
325.0000 mg | ORAL_TABLET | Freq: Every day | ORAL | Status: DC
  Administered 2022-03-03 – 2022-03-05 (×3): 325 mg via ORAL
  Filled 2022-03-02 (×3): qty 1

## 2022-03-02 MED ORDER — NORTRIPTYLINE HCL 25 MG PO CAPS
25.0000 mg | ORAL_CAPSULE | Freq: Every day | ORAL | Status: DC
  Administered 2022-03-03 – 2022-03-04 (×3): 25 mg via ORAL
  Filled 2022-03-02 (×3): qty 1

## 2022-03-02 MED ORDER — SODIUM CHLORIDE 0.9 % IV SOLN: 250.0000 mL | INTRAVENOUS | Status: DC | PRN

## 2022-03-02 MED ORDER — CLOPIDOGREL BISULFATE 75 MG PO TABS
75.0000 mg | ORAL_TABLET | Freq: Every day | ORAL | Status: DC
  Administered 2022-03-03 – 2022-03-05 (×3): 75 mg via ORAL
  Filled 2022-03-02 (×3): qty 1

## 2022-03-02 MED ORDER — ALLOPURINOL 100 MG PO TABS
100.0000 mg | ORAL_TABLET | Freq: Every day | ORAL | Status: DC
  Administered 2022-03-03 – 2022-03-05 (×3): 100 mg via ORAL
  Filled 2022-03-02 (×3): qty 1

## 2022-03-02 MED ORDER — PANTOPRAZOLE SODIUM 20 MG PO TBEC
20.0000 mg | DELAYED_RELEASE_TABLET | Freq: Every day | ORAL | Status: DC
  Administered 2022-03-03 – 2022-03-05 (×3): 20 mg via ORAL
  Filled 2022-03-02 (×3): qty 1

## 2022-03-02 MED ORDER — NITROGLYCERIN 0.4 MG SL SUBL
0.4000 mg | SUBLINGUAL_TABLET | SUBLINGUAL | Status: DC | PRN
  Administered 2022-03-03: 0.4 mg via SUBLINGUAL
  Filled 2022-03-02: qty 1

## 2022-03-02 MED ORDER — SODIUM CHLORIDE 0.9% FLUSH
3.0000 mL | Freq: Two times a day (BID) | INTRAVENOUS | Status: DC
  Administered 2022-03-03 – 2022-03-05 (×3): 3 mL via INTRAVENOUS

## 2022-03-02 MED ORDER — TAMSULOSIN HCL 0.4 MG PO CAPS
0.8000 mg | ORAL_CAPSULE | Freq: Every day | ORAL | Status: DC
  Administered 2022-03-03 – 2022-03-05 (×3): 0.8 mg via ORAL
  Filled 2022-03-02 (×3): qty 2

## 2022-03-02 MED ORDER — FUROSEMIDE 10 MG/ML IJ SOLN
60.0000 mg | Freq: Once | INTRAMUSCULAR | Status: AC
Start: 2022-03-02 — End: 2022-03-02
  Administered 2022-03-02: 60 mg via INTRAVENOUS
  Filled 2022-03-02: qty 8

## 2022-03-02 MED ORDER — ASPIRIN 81 MG PO CHEW
81.0000 mg | CHEWABLE_TABLET | Freq: Every day | ORAL | Status: DC
  Administered 2022-03-03 – 2022-03-05 (×3): 81 mg via ORAL
  Filled 2022-03-02 (×3): qty 1

## 2022-03-02 MED ORDER — ACETAMINOPHEN 500 MG PO TABS: 500.0000 mg | ORAL_TABLET | Freq: Four times a day (QID) | ORAL | Status: DC | PRN

## 2022-03-02 NOTE — ED Provider Notes (Signed)
[facility] Provider Note    Event Date/Time   First MD Initiated Contact with Patient 03/02/22 2051     (approximate)   History   Shortness of Breath   HPI  James Arnold is a 66 y.o. male with a history of CAD, CHF, COPD, end-stage renal disease on peritoneal dialysis who presents with complaints of shortness of breath.  Patient reports worsening shortness of breath over the last 2 days, he states it feels similar to the last time he was here when he had "fluid in the lungs ".  He denies chest pain     Physical Exam   Triage Vital Signs: ED Triage Vitals  Enc Vitals Group     BP 03/02/22 2052 121/66     Pulse Rate 03/02/22 2052 (!) 105     Resp 03/02/22 2052 (!) 22     Temp 03/02/22 2052 97.7 F (36.5 C)     Temp Source 03/02/22 2052 Oral     SpO2 03/02/22 2052 96 %     Weight 03/02/22 2053 68 kg (150 lb)     Height 03/02/22 2053 1.702 m ('5\' 7"'$ )     Head Circumference --      Peak Flow --      Pain Score 03/02/22 2053 5     Pain Loc --      Pain Edu? --      Excl. in [place]? --     Most recent vital signs: Vitals:   03/02/22 2130 03/02/22 2200  BP: 109/73 125/79  Pulse: (!) 105 (!) 104  Resp: 16 (!) 23  Temp:    SpO2: 96% 96%     General: Awake, no distress.  CV:  Good peripheral perfusion.  Resp:  Increased respiratory effort with mild tachypnea, bibasilar rales Abd:  No distention.  Other:  No lower extremity edema   ED Results / Procedures / Treatments   Labs (all labs ordered are listed, but only abnormal results are displayed) Labs Reviewed  CBC - Abnormal; Notable for the following components:      Result Value   RBC 3.14 (*)    Hemoglobin 8.8 (*)    HCT 29.2 (*)    RDW 16.2 (*)    All other components within normal limits  COMPREHENSIVE METABOLIC PANEL - Abnormal; Notable for the following components:   Glucose, Bld 152 (*)    BUN 58 (*)    Creatinine, Ser 7.29 (*)    Calcium 7.7 (*)    Total Protein 6.4 (*)     Albumin 3.1 (*)    AST 13 (*)    GFR, Estimated 8 (*)    All other components within normal limits  BRAIN NATRIURETIC PEPTIDE - Abnormal; Notable for the following components:   B Natriuretic Peptide 1,667.5 (*)    All other components within normal limits  TROPONIN I (HIGH SENSITIVITY) - Abnormal; Notable for the following components:   Troponin I (High Sensitivity) 308 (*)    All other components within normal limits     EKG  ED ECG REPORT I, Lavonia Drafts, the attending physician, personally viewed and interpreted this ECG.  Date: 03/02/2022  Rhythm: Heart rate 106, sinus tachycardia QRS Axis: normal Intervals: QTc 492 ST/T Wave abnormalities: No ST elevations Narrative Interpretation: no evidence of acute ischemia    RADIOLOGY Chest x-ray consistent with edema    PROCEDURES:  Critical Care performed: yes  CRITICAL CARE Performed by: Lavonia Drafts  Total critical care time: 30 minutes  Critical care time was exclusive of separately billable procedures and treating other patients.  Critical care was necessary to treat or prevent imminent or life-threatening deterioration.  Critical care was time spent personally by me on the following activities: development of treatment plan with patient and/or surrogate as well as nursing, discussions with consultants, evaluation of patient's response to treatment, examination of patient, obtaining history from patient or surrogate, ordering and performing treatments and interventions, ordering and review of laboratory studies, ordering and review of radiographic studies, pulse oximetry and re-evaluation of patient's condition.   Procedures   MEDICATIONS ORDERED IN ED: Medications  furosemide (LASIX) injection 60 mg (has no administration in time range)     IMPRESSION / MDM / ASSESSMENT AND PLAN / ED COURSE  I reviewed the triage vital signs and the nursing notes. Patient's presentation is most consistent with  acute presentation with potential threat to life or bodily function.  Patient presents with shortness of breath as detailed above.  Multiple comorbidities including end-stage renal disease, CAD, CHF, COPD  Differential diagnosis includes pulmonary edema, COPD exacerbation, pneumonia  No chest pain to suggest ACS.,  EKG shows mild sinus tachycardia otherwise reassuring  Chest x-ray is most consistent with pulmonary edema.  The patient reports he does still make urine, will give IV Lasix  Lab work consistent with known anemia, elevated BNP of 1600.  Troponin is elevated at 308, I suspect this is related to combination of end-stage renal disease and increased work of breathing/exertion, again the patient denies chest pain and has reassuring EKG  I discussed with the hospitalist Dr. Posey Pronto for admission        FINAL CLINICAL IMPRESSION(S) / ED DIAGNOSES   Final diagnoses:  Acute pulmonary edema ([place])  Elevated troponin     Rx / DC Orders   ED Discharge Orders     None        Note:  This document was prepared using Dragon voice recognition software and may include unintentional dictation errors.   Lavonia Drafts, MD 03/02/22 2233

## 2022-03-02 NOTE — H&P (Incomplete)
History and Physical    Patient: James Arnold EXB:[id number] DOB: [birth year] DOA: 03/02/2022 DOS: the patient was seen and examined on 03/03/2022 PCP: Valerie Roys, DO  Patient coming from: Home  Chief Complaint:  Chief Complaint  Patient presents with   Shortness of Breath   HPI: James Arnold is a 66 y.o. male with medical history significant of heart disease, anemia, myelofibrosis, chronic kidney disease, COPD coming with shortness of breath and chest pressure. Patient has a history of reduced ejection fraction heart failure with a EF of 35 to 40%.  Patient also has a history of COPD, hypertension, history of stroke. Patient is followed by hematology for his myelofibrosis and anemia and gets in EPO and IV therapy. At baseline patient has started to become symptomatic from his anemia and myelofibrosis with generalized fatigue.  In the emergency room his initial blood work shows a hemoglobin of 8.8 glucose 152 creatinine 7.29 BNP of 1667 troponin at 308.  Initial EKG shows sinus tachycardia at 106 with a QT of 492 and no ST T wave abnormalities. Patient given 60 mg of Lasix in the emergency room as he does make urine even though he is on peritoneal dialysis.  Admission requested for shortness of breath and chest pressure evaluation.  Review of Systems  Respiratory:  Positive for shortness of breath.   Cardiovascular:  Positive for chest pain.  All other systems reviewed and are negative.   Past Medical History:  Diagnosis Date   Anemia    Benign hypertensive renal disease    CAD (coronary artery disease)    a. 08/1996 s/p BMS to the mLAD ([facility]); b. 12/2017 MV: EF 46%, --> low risk; c. 07/2020 MV: EF 51%, no ischemia/infarct; d. 08/2021 Cath/PCI: LM nl, LAD 60p, 95p/m ISR (3.0x48 Synergy DES), RI small, nl, LCX 40p, 95d(3.5x16 Synergy DES), OM1 50, RCA 40p/172m   Carotid arterial disease ([facility]    a. 11/2020 Carotid U/S: <50% bilat ICA stenoses.   CHF (congestive heart failure)  (HCC)    Chronic HFrEF (heart failure with reduced ejection fraction) ([facility]    a. 08/2022 Echo: EF 35-40%.   CKD (chronic kidney disease), stage V ([place]    on peritoneal dialysis   Complication of anesthesia    please call by "[name]" when waking up!!   COPD (chronic obstructive pulmonary disease) (HCC)    centrilobular emphysema. pt is poorly controlled with his copd/smoking.   Depression    Diastolic dysfunction    a. 12/2017 Echo: EF 60-65%, no rwma, Gr1 DD, mild AI/MR. Nl RVSP; b. 08/2020 Echo: EF 60-65%, no rwma, GrI DD, nl RV fxn. RVSP 32.584mg.   Dyspnea on exertion    with exertion   Fatigue    FSGS (focal segmental glomerulosclerosis)    Hyperlipidemia 10/2002   Hypertension    Ischemic cardiomyopathy    a. 08/2022 Echo: EF 35-40%, glob HK, GrII DD, nl RV fxn, mildly dil LA, mild MR, mild AS.   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 12/2020 [facility]: Sinus rhythm 80 (55-117). Rare PACs/PVCs. 17 episodes of SVT (longest 20.3 secs, fastest 184). No triggered events. No afib.   Smoker    [facility]   a. 11/2020 MRI Brain: patchy acute/early subacute cortical infarcts w/in the R  frontal, parietal, and occiptal lobes. Small, chronic L basal ganglia lacunar infarct.   Thrombocytopenia ([place])    Tobacco abuse 11/07/2020   Past Surgical History:  Procedure Laterality Date   ANGIOPLASTY     BONE MARROW ASPIRATION  03/2009   CARDIAC CATHETERIZATION  1997   stent x 1   COLONOSCOPY WITH PROPOFOL N/A 08/09/2018   Procedure: COLONOSCOPY WITH PROPOFOL;  Surgeon: [name], MD;  Location[facility] END[facility]COPY;  Service: Gastroenterology;  Laterality: N/A;   CORONARY ARTERY BYPASS GRAFT     CORONARY STENT PLACEMENT  08/1996   ESOPHAGOGASTRODUODENOSCOPY (EGD) WITH PROPOFOL N/A 07/25/2021   Procedure:  ESOPHAGOGASTRODUODENOSCOPY (EGD) WITH PROPOFOL;  Surgeon: [name], MD;  Location: [facility] ENDOSCOPY;  Service: Gastroenterology;  Laterality: N/A;   EXTERIORIZATION OF A CONTINUOUS AMBULATORY PERITONEAL DIALYSIS CATHETER     EYE SURGERY Bilateral    cats removed   LEFT HEART CATH AND CORONARY ANGIOGRAPHY N/A 09/12/2021   Procedure: LEFT HEART CATH AND CORONARY ANGIOGRAPHY;  Surgeon: Wellington Hampshire, MD;  Location: [place] CV LAB;  Service: Cardiovascular;  Laterality: N/A;   REVERSE SHOULDER ARTHROPLASTY Left 11/19/2020   Procedure: Left reverse shoulder arthroplasty;  Surgeon: Leim Fabry, MD;  Location: [facility] ORS;  Service: Orthopedics;  Laterality: Left;   Social History:  reports that he quit smoking about 5 months ago. His smoking use included cigarettes. He has a 23.50 pack-year smoking history. He has never used smokeless tobacco. He reports current alcohol use. He reports that he does not use drugs.  Allergies  Allergen Reactions   Losartan Shortness Of Breath    Family History  Problem Relation Age of Onset   Stroke Mother        Low BP stroke   Depression Father    Depression Sister        Breast   Heart disease Other    Breast cancer Other    Lung cancer Other    Ovarian cancer Other    Stomach cancer Other     Prior to Admission medications   Medication Sig Start Date End Date Taking? Authorizing Provider  acetaminophen (TYLENOL) 500 MG tablet Take 500 mg by mouth every 6 (six) hours as needed for headache, fever, moderate pain or mild pain.    [provider]  albuterol (PROVENTIL) (2.5 MG/3ML) 0.083% nebulizer solution Take 3 mLs (2.5 mg total) by nebulization every 6 (six) hours as needed for wheezing or shortness of breath. 12/12/21   Johnson, Megan P, DO  albuterol (VENTOLIN HFA) 108 (90 Base) MCG/ACT inhaler Inhale 2 puffs into the lungs every 6 (six) hours as needed for wheezing or shortness of breath. 02/27/21   Borders, Kirt Boys, NP  allopurinol  (ZYLOPRIM) 100 MG tablet Take 100 mg by mouth daily.    [provider]  aspirin 81 MG chewable tablet Chew 1 tablet (81 mg total) by mouth daily. 01/08/21   Johnson, Megan P, DO  atorvastatin (LIPITOR) 40 MG tablet Take 1 tablet (40 mg total) by mouth daily. 11/04/21 02/02/22  Theora Gianotti, NP  carvedilol (COREG) 12.5 MG tablet Take 1 tablet (12.5 mg total) by mouth 2 (two) times daily. 10/10/21   End, Harrell Gave, MD  Cholecalciferol (VITAMIN D-1000 MAX ST) 25 MCG (1000 UT) tablet Take 1,000 Units by mouth daily.     [provider]  clopidogrel (PLAVIX) 75 MG tablet Take 75 mg by mouth daily. 09/20/21   [provider]  DULoxetine (CYMBALTA) 60 MG capsule Take 1 capsule (  60 mg total) by mouth daily. 01/30/22   Park Liter P, DO  ferrous sulfate 325 (65 FE) MG tablet Take 1 tablet (325 mg total) by mouth daily with breakfast. 12/30/17 08/06/23  Saundra Shelling, MD  furosemide (LASIX) 80 MG tablet Take 1 tablet (80 mg total) by mouth daily. Increased from 20 mg daily. 12/24/21 03/24/22  Enzo Bi, MD  gentamicin cream (GARAMYCIN) 0.1 % Apply 1 application topically every other day. 03/18/19   [provider]  levETIRAcetam (KEPPRA) 500 MG tablet Take 1 tablet (500 mg total) by mouth at bedtime. 11/24/20   Loletha Grayer, MD  meclizine (ANTIVERT) 12.5 MG tablet Take 1 tablet (12.5 mg total) by mouth 2 (two) times daily as needed for dizziness. 11/25/21   Cammie Sickle, MD  nitroGLYCERIN (NITROSTAT) 0.4 MG SL tablet Place 1 tablet (0.4 mg total) under the tongue every 5 (five) minutes as needed for chest pain. 09/12/21   Enzo Bi, MD  nortriptyline (PAMELOR) 25 MG capsule Take 1 capsule (25 mg total) by mouth at bedtime. 10/16/21   Johnson, Megan P, DO  pantoprazole (PROTONIX) 20 MG tablet Take 20 mg by mouth daily. 08/10/21   [provider]  polyethylene glycol (MIRALAX / GLYCOLAX) 17 g packet Take 17 g by mouth daily as needed. Patient not  taking: Reported on 02/25/2022 11/24/20   Loletha Grayer, MD  predniSONE (DELTASONE) 10 MG tablet Take 10 mg by mouth daily with breakfast.    [provider]  SPS 15 GM/60ML suspension Take by mouth. Patient not taking: Reported on 02/25/2022 01/03/22   [provider]  tamsulosin (FLOMAX) 0.4 MG CAPS capsule Take 2 capsules (0.8 mg total) by mouth daily. 01/02/22   Valerie Roys, DO    Physical Exam: Vitals:   03/03/22 0000 03/03/22 0030 03/03/22 0100 03/03/22 0130  BP: 123/80 118/76 111/77 110/79  Pulse: (!) 103 (!) 104 (!) 106 (!) 106  Resp: (!) 24 (!) 25 (!) 28 (!) 25  Temp:      TempSrc:      SpO2: 94% 94% 94% 94%  Weight:      Height:      Physical Exam Vitals and nursing note reviewed.  Constitutional:      General: He is not in acute distress.    Appearance: Normal appearance. He is not ill-appearing, toxic-appearing or diaphoretic.  HENT:     Head: Normocephalic and atraumatic.     Right Ear: Hearing and external ear normal.     Left Ear: Hearing and external ear normal.     Nose: Nose normal. No nasal deformity.     Mouth/Throat:     Lips: Pink.     Mouth: Mucous membranes are moist.     Tongue: No lesions.     Pharynx: Oropharynx is clear.  Eyes:     Extraocular Movements: Extraocular movements intact.     Pupils: Pupils are equal, round, and reactive to light.  Cardiovascular:     Rate and Rhythm: Normal rate and regular rhythm.     Pulses: Normal pulses.          Dorsalis pedis pulses are 2+ on the right side and 2+ on the left side.     Heart sounds:     Gallop present. S3 sounds present.  Pulmonary:     Effort: Pulmonary effort is normal.     Breath sounds: Examination of the right-upper field reveals rales. Examination of the left-upper field reveals rales. Examination of  the right-middle field reveals rales. Examination of the left-middle field reveals rales. Examination of the right-lower field reveals rales. Examination of the left-lower  field reveals rales. Rales present.  Abdominal:     General: Abdomen is protuberant. Bowel sounds are normal. There is no distension.     Palpations: Abdomen is soft. There is no mass.     Tenderness: There is no abdominal tenderness. There is no guarding.     Hernia: No hernia is present.  Musculoskeletal:     Right lower leg: No edema.     Left lower leg: No edema.  Skin:    General: Skin is warm.  Neurological:     General: No focal deficit present.     Mental Status: He is alert and oriented to person, place, and time.     Cranial Nerves: Cranial nerves 2-12 are intact.     Motor: Motor function is intact.  Psychiatric:        Attention and Perception: Attention normal.        Mood and Affect: Mood normal.        Speech: Speech normal.        Behavior: Behavior normal. Behavior is cooperative.        Cognition and Memory: Cognition normal.     Data Reviewed: Results for orders placed or performed during the hospital encounter of 03/02/22 (from the past 24 hour(s))  CBC     Status: Abnormal   Collection Time: 03/02/22  9:05 PM  Result Value Ref Range   WBC 7.5 4.0 - 10.5 K/uL   RBC 3.14 (L) 4.22 - 5.81 MIL/uL   Hemoglobin 8.8 (L) 13.0 - 17.0 g/dL   HCT 29.2 (L) 39.0 - 52.0 %   MCV 93.0 80.0 - 100.0 fL   MCH 28.0 26.0 - 34.0 pg   MCHC 30.1 30.0 - 36.0 g/dL   RDW 16.2 (H) 11.5 - 15.5 %   Platelets 170 150 - 400 K/uL   nRBC 0.0 0.0 - 0.2 %  Comprehensive metabolic panel     Status: Abnormal   Collection Time: 03/02/22  9:05 PM  Result Value Ref Range   Sodium 139 135 - 145 mmol/L   Potassium 3.5 3.5 - 5.1 mmol/L   Chloride 102 98 - 111 mmol/L   CO2 25 22 - 32 mmol/L   Glucose, Bld 152 (H) 70 - 99 mg/dL   BUN 58 (H) 8 - 23 mg/dL   Creatinine, Ser 7.29 (H) 0.61 - 1.24 mg/dL   Calcium 7.7 (L) 8.9 - 10.3 mg/dL   Total Protein 6.4 (L) 6.5 - 8.1 g/dL   Albumin 3.1 (L) 3.5 - 5.0 g/dL   AST 13 (L) 15 - 41 U/L   ALT 11 0 - 44 U/L   Alkaline Phosphatase 61 38 - 126 U/L    Total Bilirubin 0.8 0.3 - 1.2 mg/dL   GFR, Estimated 8 (L) >60 mL/min   Anion gap 12 5 - 15  Troponin I (High Sensitivity)     Status: Abnormal   Collection Time: 03/02/22  9:05 PM  Result Value Ref Range   Troponin I (High Sensitivity) 308 (HH) <18 ng/L  Brain natriuretic peptide     Status: Abnormal   Collection Time: 03/02/22  9:05 PM  Result Value Ref Range   B Natriuretic Peptide 1,667.5 (H) 0.0 - 100.0 pg/mL     Assessment and Plan: * SOB (shortness of breath) D/d include due to volume overload from CHF and  renal failure in combination with ischemia heart disease.  Pt give lasix 80 mg but voided little.  Nephrology consult and am message sent.     CAD (coronary artery disease) Pt has Obstructive Cad with recommendation for cath  We will consult cardiology.  His cath in 2022 showed:   1st Mrg lesion is 50% stenosed.   Dist Cx lesion is 95% stenosed.   Prox Cx to Mid Cx lesion is 40% stenosed.   Prox LAD to Mid LAD lesion is 95% stenosed.   Prox LAD lesion is 60% stenosed.   Prox RCA lesion is 40% stenosed.   Mid RCA lesion is 100% stenosed.   1.  Left dominant coronary arteries with severe three-vessel coronary artery disease.  The coronary arteries are heavily calcified especially the LAD. 2.  Left ventricular angiography was not performed to preserve contrast given residual renal function.  EF was moderately reduced by echo. 3.  Mildly elevated left ventricular end-diastolic pressure at 19 mmHg.   Recommendations: Given heavily calcified three-vessel coronary artery disease and cardiomyopathy, best option is CABG if the patient is deemed to be a candidate considering his comorbidities.  Otherwise, high risk PCI to the LAD and left circumflex can be considered with atherectomy to the LAD. Recommend resuming heparin drip in 6 hours (at 2200)  Pt currently on ASA/ Plavix and DVT heparin 5000 tid. EKG today shows  S.Tach 106, qtc 492 and LVH with st elevation in  V2.   Previous EKG in May 2023 shows sinus rhythm 93 QTc prolonged at 527 LVH ST depression in V2 to V6 and T wave inversions in lateral leads see image below:  TNI:  12/22/21 14:15 12/22/21 16:04 12/22/21 17:25 03/02/22 21:05 03/02/22 23:30  Troponin I (High Sensitivity) 48 (H) 44 (H) 45 (H) 308 (HH) 320 (HH)    >Cont asa/ statin/ coreg/ NTG/ heparin dvt/ plavix.   Acute on chronic combined systolic and diastolic CHF (congestive heart failure) ([place]) 12/2021 2 D Echo: 1. Left ventricular ejection fraction, by estimation, is 30 to 35%. Left  ventricular ejection fraction by 2D MOD biplane is 32.0 %. The left  ventricle has moderate to severely decreased function. The left ventricle  demonstrates global hypokinesis. The  left ventricular internal cavity size was moderately dilated. Left  ventricular diastolic parameters are consistent with Grade II diastolic  dysfunction (pseudonormalization). The average left ventricular global  longitudinal strain is -11.5 %. The global  longitudinal strain is abnormal.   2. Right ventricular systolic function is low normal. The right  ventricular size is mildly enlarged.   3. Left atrial size was severely dilated.   4. The mitral valve is normal in structure. Moderate mitral valve  regurgitation.   5. Mean aortic valve gradient 41mHg, Peak gradient 3104mg, Vmax 2.64m43m  AVA 1.2cm2. . The aortic valve was not well visualized. Aortic valve  regurgitation is mild. Moderate aortic valve stenosis.   6. The inferior vena cava is normal in size with greater than 50%  respiratory variability, suggesting right atrial pressure of 3 mmHg.   Comparison(s): LVEF 35-40%, Mild MR, Mild AS.   [facility]rology Dr. LatHolley RaringCont PD per routine. Nephrology consult.    Anemia of chronic kidney failure, stage 5 (HCC) Stable cont iron therapy.  Type / screen.  COPD (chronic obstructive pulmonary disease)  (HCC) Stable. Cont with PRN albuterol and duoneb.    [facility] ([facility].     Advance Care  Planning:    Code Status: Full Code   Consults:  Cardiology: Dr.Gollan.  Nephrology: Dr.Korrapti.  Family Communication:  Kaiel, Weide (Spouse)  [phone] (Mobile)  Severity of Illness: The appropriate patient status for this patient is INPATIENT. Inpatient status is judged to be reasonable and necessary in order to provide the required intensity of service to ensure the patient's safety. The patient's presenting symptoms, physical exam findings, and initial radiographic and laboratory data in the context of their chronic comorbidities is felt to place them at high risk for further clinical deterioration. Furthermore, it is not anticipated that the patient will be medically stable for discharge from the hospital within 2 midnights of admission.   * I certify that at the point of admission it is my clinical judgment that the patient will require inpatient hospital care spanning beyond 2 midnights from the point of admission due to high intensity of service, high risk for further deterioration and high frequency of surveillance required.*  Author: Para Skeans, MD 03/03/2022 1:57 AM  For on call review [email or web address].

## 2022-03-02 NOTE — ED Triage Notes (Signed)
Pt arrives from home via [facility] with CC of SOB after having bowel movement. Pt self administered nitro tablet and breathing treatment prior to EMS arrival. Pt reports difficulty taking full breath and pressure to R side of chest - similar feeling to previous fluid overload.

## 2022-03-02 NOTE — Hospital Course (Signed)
66 y/o SOB and has been worsening. TNI is 308, ekg sinus tach.  Peritoneal dialysis. Chest xray pulmonary edema.  Pt has h/o CAD.

## 2022-03-02 NOTE — Assessment & Plan Note (Signed)
12/2021 2 D Echo: 1. Left ventricular ejection fraction, by estimation, is 30 to 35%. Left  ventricular ejection fraction by 2D MOD biplane is 32.0 %. The left  ventricle has moderate to severely decreased function. The left ventricle  demonstrates global hypokinesis. The  left ventricular internal cavity size was moderately dilated. Left  ventricular diastolic parameters are consistent with Grade II diastolic  dysfunction (pseudonormalization). The average left ventricular global  longitudinal strain is -11.5 %. The global  longitudinal strain is abnormal.  2. Right ventricular systolic function is low normal. The right  ventricular size is mildly enlarged.  3. Left atrial size was severely dilated.  4. The mitral valve is normal in structure. Moderate mitral valve  regurgitation.  5. Mean aortic valve gradient 51mHg, Peak gradient 374mg, Vmax 2.55m83m  AVA 1.2cm2. . The aortic valve was not well visualized. Aortic valve  regurgitation is mild. Moderate aortic valve stenosis.  6. The inferior vena cava is normal in size with greater than 50%  respiratory variability, suggesting right atrial pressure of 3 mmHg.   Comparison(s): LVEF 35-40%, Mild MR, Mild AS.

## 2022-03-03 ENCOUNTER — Inpatient Hospital Stay: Payer: Medicare HMO

## 2022-03-03 ENCOUNTER — Encounter: Payer: Self-pay | Admitting: Internal Medicine

## 2022-03-03 ENCOUNTER — Other Ambulatory Visit: Payer: Self-pay

## 2022-03-03 DIAGNOSIS — D7581 Myelofibrosis: Secondary | ICD-10-CM | POA: Diagnosis not present

## 2022-03-03 DIAGNOSIS — R0603 Acute respiratory distress: Secondary | ICD-10-CM | POA: Diagnosis present

## 2022-03-03 DIAGNOSIS — R079 Chest pain, unspecified: Secondary | ICD-10-CM | POA: Diagnosis not present

## 2022-03-03 DIAGNOSIS — I2510 Atherosclerotic heart disease of native coronary artery without angina pectoris: Secondary | ICD-10-CM | POA: Diagnosis present

## 2022-03-03 DIAGNOSIS — R0602 Shortness of breath: Secondary | ICD-10-CM | POA: Diagnosis not present

## 2022-03-03 DIAGNOSIS — Z992 Dependence on renal dialysis: Secondary | ICD-10-CM | POA: Diagnosis not present

## 2022-03-03 DIAGNOSIS — Z955 Presence of coronary angioplasty implant and graft: Secondary | ICD-10-CM | POA: Diagnosis not present

## 2022-03-03 DIAGNOSIS — N2581 Secondary hyperparathyroidism of renal origin: Secondary | ICD-10-CM | POA: Diagnosis present

## 2022-03-03 DIAGNOSIS — I132 Hypertensive heart and chronic kidney disease with heart failure and with stage 5 chronic kidney disease, or end stage renal disease: Secondary | ICD-10-CM | POA: Diagnosis present

## 2022-03-03 DIAGNOSIS — Z87891 Personal history of nicotine dependence: Secondary | ICD-10-CM | POA: Diagnosis not present

## 2022-03-03 DIAGNOSIS — D471 Chronic myeloproliferative disease: Secondary | ICD-10-CM | POA: Diagnosis present

## 2022-03-03 DIAGNOSIS — I5043 Acute on chronic combined systolic (congestive) and diastolic (congestive) heart failure: Secondary | ICD-10-CM

## 2022-03-03 DIAGNOSIS — I248 Other forms of acute ischemic heart disease: Secondary | ICD-10-CM | POA: Diagnosis not present

## 2022-03-03 DIAGNOSIS — R778 Other specified abnormalities of plasma proteins: Secondary | ICD-10-CM | POA: Diagnosis present

## 2022-03-03 DIAGNOSIS — I9589 Other hypotension: Secondary | ICD-10-CM | POA: Diagnosis present

## 2022-03-03 DIAGNOSIS — T8602 Bone marrow transplant failure: Secondary | ICD-10-CM | POA: Diagnosis present

## 2022-03-03 DIAGNOSIS — J810 Acute pulmonary edema: Secondary | ICD-10-CM | POA: Diagnosis present

## 2022-03-03 DIAGNOSIS — D631 Anemia in chronic kidney disease: Secondary | ICD-10-CM | POA: Diagnosis present

## 2022-03-03 DIAGNOSIS — I080 Rheumatic disorders of both mitral and aortic valves: Secondary | ICD-10-CM | POA: Diagnosis present

## 2022-03-03 DIAGNOSIS — Z888 Allergy status to other drugs, medicaments and biological substances status: Secondary | ICD-10-CM | POA: Diagnosis not present

## 2022-03-03 DIAGNOSIS — Z8673 Personal history of transient ischemic attack (TIA), and cerebral infarction without residual deficits: Secondary | ICD-10-CM | POA: Diagnosis not present

## 2022-03-03 DIAGNOSIS — N186 End stage renal disease: Secondary | ICD-10-CM | POA: Diagnosis present

## 2022-03-03 DIAGNOSIS — Z515 Encounter for palliative care: Secondary | ICD-10-CM | POA: Diagnosis not present

## 2022-03-03 DIAGNOSIS — I255 Ischemic cardiomyopathy: Secondary | ICD-10-CM | POA: Diagnosis present

## 2022-03-03 DIAGNOSIS — E785 Hyperlipidemia, unspecified: Secondary | ICD-10-CM | POA: Diagnosis present

## 2022-03-03 DIAGNOSIS — Z951 Presence of aortocoronary bypass graft: Secondary | ICD-10-CM | POA: Diagnosis not present

## 2022-03-03 DIAGNOSIS — I252 Old myocardial infarction: Secondary | ICD-10-CM | POA: Diagnosis not present

## 2022-03-03 DIAGNOSIS — J432 Centrilobular emphysema: Secondary | ICD-10-CM | POA: Diagnosis present

## 2022-03-03 DIAGNOSIS — E876 Hypokalemia: Secondary | ICD-10-CM | POA: Diagnosis not present

## 2022-03-03 DIAGNOSIS — Z96612 Presence of left artificial shoulder joint: Secondary | ICD-10-CM | POA: Diagnosis present

## 2022-03-03 DIAGNOSIS — I5023 Acute on chronic systolic (congestive) heart failure: Secondary | ICD-10-CM | POA: Diagnosis not present

## 2022-03-03 LAB — CBC
HCT: 27.1 % — ABNORMAL LOW (ref 39.0–52.0)
Hemoglobin: 8.4 g/dL — ABNORMAL LOW (ref 13.0–17.0)
MCH: 28.8 pg (ref 26.0–34.0)
MCHC: 31 g/dL (ref 30.0–36.0)
MCV: 92.8 fL (ref 80.0–100.0)
Platelets: 168 10*3/uL (ref 150–400)
RBC: 2.92 MIL/uL — ABNORMAL LOW (ref 4.22–5.81)
RDW: 16.4 % — ABNORMAL HIGH (ref 11.5–15.5)
WBC: 7.9 10*3/uL (ref 4.0–10.5)
nRBC: 0 % (ref 0.0–0.2)

## 2022-03-03 LAB — COMPREHENSIVE METABOLIC PANEL
ALT: 11 U/L (ref 0–44)
AST: 13 U/L — ABNORMAL LOW (ref 15–41)
Albumin: 2.8 g/dL — ABNORMAL LOW (ref 3.5–5.0)
Alkaline Phosphatase: 53 U/L (ref 38–126)
Anion gap: 13 (ref 5–15)
BUN: 60 mg/dL — ABNORMAL HIGH (ref 8–23)
CO2: 22 mmol/L (ref 22–32)
Calcium: 7.4 mg/dL — ABNORMAL LOW (ref 8.9–10.3)
Chloride: 104 mmol/L (ref 98–111)
Creatinine, Ser: 7.4 mg/dL — ABNORMAL HIGH (ref 0.61–1.24)
GFR, Estimated: 8 mL/min — ABNORMAL LOW (ref >60)
Glucose, Bld: 106 mg/dL — ABNORMAL HIGH (ref 70–99)
Potassium: 3.4 mmol/L — ABNORMAL LOW (ref 3.5–5.1)
Sodium: 139 mmol/L (ref 135–145)
Total Bilirubin: 0.8 mg/dL (ref 0.3–1.2)
Total Protein: 5.7 g/dL — ABNORMAL LOW (ref 6.5–8.1)

## 2022-03-03 LAB — TYPE AND SCREEN
ABO/RH(D): A POS
Antibody Screen: NEGATIVE

## 2022-03-03 LAB — HEPATITIS B SURFACE ANTIGEN: Hepatitis B Surface Ag: NONREACTIVE

## 2022-03-03 LAB — TROPONIN I (HIGH SENSITIVITY)
Troponin I (High Sensitivity): 231 ng/L (ref <18)
Troponin I (High Sensitivity): 285 ng/L (ref <18)
Troponin I (High Sensitivity): 320 ng/L (ref <18)

## 2022-03-03 LAB — MAGNESIUM: Magnesium: 1.8 mg/dL (ref 1.7–2.4)

## 2022-03-03 MED ORDER — DELFLEX-LC/2.5% DEXTROSE 394 MOSM/L IP SOLN
INTRAPERITONEAL | Status: DC
  Administered 2022-03-04: 6000 mL via INTRAPERITONEAL
  Filled 2022-03-03: qty 3000

## 2022-03-03 MED ORDER — MIDODRINE HCL 5 MG PO TABS
10.0000 mg | ORAL_TABLET | Freq: Three times a day (TID) | ORAL | Status: DC
Start: 2022-03-03 — End: 2022-03-03

## 2022-03-03 MED ORDER — HYDROMORPHONE HCL 1 MG/ML IJ SOLN: 0.5000 mg | Freq: Once | INTRAMUSCULAR | Status: DC

## 2022-03-03 MED ORDER — CARVEDILOL 6.25 MG PO TABS
6.2500 mg | ORAL_TABLET | Freq: Two times a day (BID) | ORAL | Status: DC
  Administered 2022-03-03 – 2022-03-05 (×4): 6.25 mg via ORAL
  Filled 2022-03-03 (×4): qty 1

## 2022-03-03 MED ORDER — MIDODRINE HCL 5 MG PO TABS
5.0000 mg | ORAL_TABLET | Freq: Three times a day (TID) | ORAL | Status: DC
  Administered 2022-03-03 – 2022-03-05 (×6): 5 mg via ORAL
  Filled 2022-03-03 (×6): qty 1

## 2022-03-03 MED ORDER — FUROSEMIDE 10 MG/ML IJ SOLN
80.0000 mg | Freq: Every day | INTRAMUSCULAR | Status: DC
  Administered 2022-03-03 – 2022-03-05 (×3): 80 mg via INTRAVENOUS
  Filled 2022-03-03 (×3): qty 8

## 2022-03-03 MED ORDER — FUROSEMIDE 10 MG/ML IJ SOLN: 40.0000 mg | Freq: Once | INTRAMUSCULAR | Status: DC

## 2022-03-03 MED ORDER — GENTAMICIN SULFATE 0.1 % EX CREA
1.0000 "application " | TOPICAL_CREAM | Freq: Every day | CUTANEOUS | Status: DC
  Administered 2022-03-04 – 2022-03-05 (×2): 1 via TOPICAL
  Filled 2022-03-03: qty 15

## 2022-03-03 NOTE — Progress Notes (Signed)
Rapid Response Event Note   Reason for Call : shortness of breath, chest pain   Initial Focused Assessment: On my arrival pt is lying in bed, alert. Primary RN states pt is a PD patient but has not had dialysis since he was admitted. Dialysis RN at bedside setting up for PD at this time. Pt does seem short of breath but pt states that his breathing has improved since being admitted. Pt states he is having some chest pressure. Pt is on 2L [place] and all VSS at this time.   Interventions: Neomia Glass NP at bedside. RT also at bedside and will bring breathing treatment. Chest xray ordered by NP. Pt has IV morphine and nitro tabs ordered.    Plan of Care: Pt to remain on progressive unit at this time. Will follow up on patient later tonight once PD is up and running.     Event Summary:   MD Notified: Neomia Glass NP Call Time: 2024 Arrival Time: 2025 End Time: 2035  Trellis Paganini, RN

## 2022-03-03 NOTE — ED Notes (Signed)
Attending Ayiku B. Notified via secure chat of pt's current BP and MAP readings. Confirming with provider whether still wants full '80mg'$  of lasix given now. Awaiting reply. HR 105.

## 2022-03-03 NOTE — Assessment & Plan Note (Signed)
Stable cont iron therapy.  Type / screen.

## 2022-03-03 NOTE — Assessment & Plan Note (Addendum)
Pt has Obstructive Cad with recommendation for cath  We will consult cardiology.  His cath in 2022 showed: .  1st Mrg lesion is 50% stenosed. .  Dist Cx lesion is 95% stenosed. .  Prox Cx to Mid Cx lesion is 40% stenosed. .  Prox LAD to Mid LAD lesion is 95% stenosed. .  Prox LAD lesion is 60% stenosed. .  Prox RCA lesion is 40% stenosed. .  Mid RCA lesion is 100% stenosed.  1.  Left dominant coronary arteries with severe three-vessel coronary artery disease.  The coronary arteries are heavily calcified especially the LAD. 2.  Left ventricular angiography was not performed to preserve contrast given residual renal function.  EF was moderately reduced by echo. 3.  Mildly elevated left ventricular end-diastolic pressure at 19 mmHg.  Recommendations: Given heavily calcified three-vessel coronary artery disease and cardiomyopathy, best option is CABG if the patient is deemed to be a candidate considering his comorbidities.  Otherwise, high risk PCI to the LAD and left circumflex can be considered with atherectomy to the LAD. Recommend resuming heparin drip in 6 hours (at 2200)  Pt currently on ASA/ Plavix and DVT heparin 5000 tid. EKG today shows  S.Tach 106, qtc 492 and LVH with st elevation in V2.   Previous EKG in May 2023 shows sinus rhythm 93 QTc prolonged at 527 LVH ST depression in V2 to V6 and T wave inversions in lateral leads see image below:  TNI:  12/22/21 14:15 12/22/21 16:04 12/22/21 17:25 03/02/22 21:05 03/02/22 23:30  Troponin I (High Sensitivity) 48 (H) 44 (H) 45 (H) 308 (HH) 320 (HH)   >Cont asa/ statin/ coreg/ NTG/ heparin dvt/ plavix.

## 2022-03-03 NOTE — Progress Notes (Signed)
   03/03/22 1618  Assess: MEWS Score  Temp 98.2 F (36.8 C)  BP 100/67  MAP (mmHg) 78  Pulse Rate (!) 110  ECG Heart Rate (!) 110  Resp 18  SpO2 100 %  O2 Device Nasal Cannula  O2 Flow Rate (L/min) 2 L/min  Assess: MEWS Score  MEWS Temp 0  MEWS Systolic 1  MEWS Pulse 1  MEWS RR 0  MEWS LOC 0  MEWS Score 2  MEWS Score Color Yellow  Assess: if the MEWS score is Yellow or Red  Were vital signs taken at a resting state? Yes  Focused Assessment Change from prior assessment (see assessment flowsheet)  Does the patient meet 2 or more of the SIRS criteria? No  MEWS guidelines implemented *See Row Information* Yes  Treat  Pain Scale 0-10  Pain Score 4  Pain Type Acute pain  Pain Location Chest  Pain Descriptors / Indicators Pressure  Pain Frequency Constant  Pain Onset On-going  Patients Stated Pain Goal 0  Pain Intervention(s) Repositioned  Take Vital Signs  Increase Vital Sign Frequency  Yellow: Q 2hr X 2 then Q 4hr X 2, if remains yellow, continue Q 4hrs  Escalate  MEWS: Escalate Yellow: discuss with charge nurse/RN and consider discussing with provider and RRT  Notify: Charge Nurse/RN  Name of Charge Nurse/RN Notified Stephanie, RN  Date Charge Nurse/RN Notified 03/03/22  Time Charge Nurse/RN Notified 1630  Assess: SIRS CRITERIA  SIRS Temperature  0  SIRS Pulse 1  SIRS Respirations  0  SIRS WBC 0  SIRS Score Sum  1

## 2022-03-03 NOTE — ED Notes (Signed)
Pt reports decreased pain/pressure after oxygen administration of 2L via [place].

## 2022-03-03 NOTE — Progress Notes (Signed)
       CROSS COVER NOTE  NAME: James Arnold MRN: [id number] DOB : [birth year]    Date of Service   03/03/2022  HPI/Events of Note   Rapid response called for RED MEWS, chest pain, and shortness of breath.  Temp 45F, BP 104/68, HR 115, RR 34, SPO2 97% on 2L O2 via Nasal Cannula  [name] is a 66 year old male with medical history significant for heart disease, anemia, myelofibrosis, chronic kidney disease, COPD, hypertension, and history of stroke. He was admitted for shortness of breath and evaluation of chest pressure.  On review of vitals trend Vital signs have been stable [name] has been tachycardic since presentation, respiratory rate is elevated from baseline at 34.  He is dyspneic but in no acute distress, he is not hypoxic. Breath sounds are clear to auscultation throughout.  He is currently being hooked up to peritoneal dialysis for the night.  Pt reports chest pain is much improved than on presentation he rates it as 6.5/10 and describes his discomfort as chest pressure.  He also reports that he feels overall much better than he did in ED.   Interventions   Plan: Give scheduled/PRN albuterol Continue PRN morphine/PRN nitroglycerin for chest pain EKG CXR Troponin, D-Dimer    Neomia Glass DNP, MHA, FNP-BC Nurse Practitioner [facility] Pager [phone]

## 2022-03-03 NOTE — Assessment & Plan Note (Signed)
D/d include due to volume overload from CHF and renal failure in combination with ischemia heart disease.  Pt give lasix 80 mg but voided little.  Nephrology consult and am message sent.

## 2022-03-03 NOTE — Assessment & Plan Note (Signed)
Per hematology 

## 2022-03-03 NOTE — ED Notes (Signed)
Pt up to bedside toilet to have BM. Steady. Pt denies any pain or needs currently.

## 2022-03-03 NOTE — Progress Notes (Signed)
Peritoneal Dialysis patient known at [facility]. Patient stated he normally drives himself to appointments, however within the last month or two his wife has been transporting due to patient feeling weak. Patient did not have an dialysis concerns, education provided.

## 2022-03-03 NOTE — Consult Note (Signed)
Cardiology Consultation:   [name] [place] MRN: [id number]; DOB: [birth year]  Admit date: 03/02/2022 Date of Consult: 03/03/2022  PCP:  Valerie Roys, DO   [facility] Providers Cardiologist:  Nelva Bush, MD        Patient Profile:   James Arnold is a 66 y.o. male with a hx of coronary artery disease status post multiple PCI's most recently to LAD and left circumflex LAD in 08/2021, HFrEF, essential hypertension, hyperlipidemia, stroke, end-stage renal disease on peritoneal dialysis, COPD, myelofibrosis, who is being seen 03/03/2022 for the evaluation of shortness of breath with elevated BNP at the request of Dr. Posey Pronto.  History of Present Illness:   [name] presented to the emergency department for new onset sudden shortness of breath.  He stated that he noted that his shortness of breath had worsened over the last 2 days which he said feels similar to the last time when he had "fluid in his lungs".  He denies any chest pain, weight gain, lower extremity swelling, and/or abdominal swelling. Of note he is on peritoneal dialysis.He is followed by nephrology as well as hematology for his myelofibrosis and anemia and gets EPO and IV therapy.  The last time he was seen in clinic by Dr. Saunders Revel was 01/23/2022 where he stated that he was doing better and had improved dizziness since undergoing an evaluation by ENT.  He was admitted to the hospital in early April for acute on chronic HFrEF.  Subsequent echo showed continued severely reduced LVEF, even slightly worse compared to prior study in 08/2021 before his PCI at [facility].  He was previously followed by heart failure clinic by Darylene Price, NP at which time he had started complaining of fatigue, shortness of breath, and intermittent chest pain but required no medication changes.  Initial vital signs: Blood pressure 109/73, pulse 105, respirations 16, SPO2 96%  Pertinent labs: RBC 3.14, hemoglobin 8.8, hematocrit 29.2, glucose 152,  BUN of 58, creatinine 7.29, calcium 7.7, albumin 3.1, GFR of 8, BNP 1667.5, high-sensitivity troponin 308, 320, 285  Medications given in the emergency department: Furosemide 60 mg IVP  Imaging: Congestive heart failure.  Superimposed pneumonia is not excluded  Past Medical History:  Diagnosis Date   Anemia    Benign hypertensive renal disease    CAD (coronary artery disease)    a. 08/1996 s/p BMS to the mLAD ([facility]); b. 12/2017 MV: EF 46%, --> low risk; c. 07/2020 MV: EF 51%, no ischemia/infarct; d. 08/2021 Cath/PCI: LM nl, LAD 60p, 95p/m ISR (3.0x48 Synergy DES), RI small, nl, LCX 40p, 95d(3.5x16 Synergy DES), OM1 50, RCA 40p/116m   Carotid arterial disease ([place]    a. 11/2020 Carotid U/S: <50% bilat ICA stenoses.   CHF (congestive heart failure) (HCC)    Chronic HFrEF (heart failure with reduced ejection fraction) ([place]    a. 08/2022 Echo: EF 35-40%.   CKD (chronic kidney disease), stage V ([place]    on peritoneal dialysis   Complication of anesthesia    please call by "[name]" when waking up!!   COPD (chronic obstructive pulmonary disease) (HCC)    centrilobular emphysema. pt is poorly controlled with his copd/smoking.   Depression    Diastolic dysfunction    a. 12/2017 Echo: EF 60-65%, no rwma, Gr1 DD, mild AI/MR. Nl RVSP; b. 08/2020 Echo: EF 60-65%, no rwma, GrI DD, nl RV fxn. RVSP 32.521mg.   Dyspnea on exertion    with exertion   Fatigue    FSGS (focal  segmental glomerulosclerosis)    Hyperlipidemia 10/2002   Hypertension    Ischemic cardiomyopathy    a. 08/2022 Echo: EF 35-40%, glob HK, GrII DD, nl RV fxn, mildly dil LA, mild MR, mild AS.   myelofibrosis    bone marrow failure   Myelofibrosis ([place]) 2010   JAK2 (+) primary   Myocardial infarction ([place]) 1997   stent x 1   Pneumoperitoneum 02/2021   PSVT (paroxysmal supraventricular tachycardia) ([place])    a. 12/2020 Zio: Sinus rhythm 80 (55-117). Rare PACs/PVCs. 17 episodes of SVT (longest 20.3 secs, fastest 184). No  triggered events. No afib.   Smoker    Stroke [facility])    a. 11/2020 MRI Brain: patchy acute/early subacute cortical infarcts w/in the R frontal, parietal, and occiptal lobes. Small, chronic L basal ganglia lacunar infarct.   Thrombocytopenia ([place])    Tobacco abuse 11/07/2020    Past Surgical History:  Procedure Laterality Date   ANGIOPLASTY     BONE MARROW ASPIRATION  03/2009   CARDIAC CATHETERIZATION  1997   stent x 1   COLONOSCOPY WITH PROPOFOL N/A 08/09/2018   Procedure: COLONOSCOPY WITH PROPOFOL;  Surgeon: [name], MD;  Location: [facility] ENDOSCOPY;  Service: Gastroenterology;  Laterality: N/A;   CORONARY ARTERY BYPASS GRAFT     CORONARY STENT PLACEMENT  08/1996   ESOPHAGOGASTRODUODENOSCOPY (EGD) WITH PROPOFOL N/A 07/25/2021   Procedure: ESOPHAGOGASTRODUODENOSCOPY (EGD) WITH PROPOFOL;  Surgeon: [name], MD;  Location: [facility] ENDOSCOPY;  Service: Gastroenterology;  Laterality: N/A;   EXTERIORIZATION OF A CONTINUOUS AMBULATORY PERITONEAL DIALYSIS CATHETER     EYE SURGERY Bilateral    cats removed   LEFT HEART CATH AND CORONARY ANGIOGRAPHY N/A 09/12/2021   Procedure: LEFT HEART CATH AND CORONARY ANGIOGRAPHY;  Surgeon: Wellington Hampshire, MD;  Location: [place] CV LAB;  Service: Cardiovascular;  Laterality: N/A;   REVERSE SHOULDER ARTHROPLASTY Left 11/19/2020   Procedure: Left reverse shoulder arthroplasty;  Surgeon: Leim Fabry, MD;  Location: [facility] ORS;  Service: Orthopedics;  Laterality: Left;     Home Medications:  Prior to Admission medications   Medication Sig Start Date End Date Taking? Authorizing Provider  acetaminophen (TYLENOL) 500 MG tablet Take 500 mg by mouth every 6 (six) hours as needed for headache, fever, moderate pain or mild pain.   Yes [provider]  albuterol (PROVENTIL) (2.5 MG/3ML) 0.083% nebulizer solution Take 3 mLs (2.5 mg total) by nebulization every 6 (six) hours as needed for wheezing or shortness of breath. 12/12/21  Yes Johnson, Megan P,  DO  allopurinol (ZYLOPRIM) 100 MG tablet Take 100 mg by mouth daily.   Yes [provider]  aspirin 81 MG chewable tablet Chew 1 tablet (81 mg total) by mouth daily. 01/08/21  Yes Johnson, Megan P, DO  carvedilol (COREG) 12.5 MG tablet Take 1 tablet (12.5 mg total) by mouth 2 (two) times daily. 10/10/21  Yes End, Harrell Gave, MD  Cholecalciferol (VITAMIN D-1000 MAX ST) 25 MCG (1000 UT) tablet Take 1,000 Units by mouth daily.    Yes [provider]  clopidogrel (PLAVIX) 75 MG tablet Take 75 mg by mouth daily. 09/20/21  Yes [provider]  DULoxetine (CYMBALTA) 60 MG capsule Take 1 capsule (60 mg total) by mouth daily. 01/30/22  Yes Johnson, Megan P, DO  ferrous sulfate 325 (65 FE) MG tablet Take 1 tablet (325 mg total) by mouth daily with breakfast. 12/30/17 08/06/23 Yes Pyreddy, Reatha Harps, MD  furosemide (LASIX) 80 MG tablet Take 1 tablet (80 mg total) by mouth  daily. Increased from 20 mg daily. 12/24/21 03/24/22 Yes Enzo Bi, MD  gentamicin cream (GARAMYCIN) 0.1 % Apply 1 application topically every other day. 03/18/19  Yes [provider]  levETIRAcetam (KEPPRA) 500 MG tablet Take 1 tablet (500 mg total) by mouth at bedtime. 11/24/20  Yes Loletha Grayer, MD  meclizine (ANTIVERT) 12.5 MG tablet Take 1 tablet (12.5 mg total) by mouth 2 (two) times daily as needed for dizziness. 11/25/21  Yes Cammie Sickle, MD  nitroGLYCERIN (NITROSTAT) 0.4 MG SL tablet Place 1 tablet (0.4 mg total) under the tongue every 5 (five) minutes as needed for chest pain. 09/12/21  Yes Enzo Bi, MD  nortriptyline (PAMELOR) 25 MG capsule Take 1 capsule (25 mg total) by mouth at bedtime. 10/16/21  Yes Johnson, Megan P, DO  pantoprazole (PROTONIX) 20 MG tablet Take 20 mg by mouth daily. 08/10/21  Yes [provider]  tamsulosin (FLOMAX) 0.4 MG CAPS capsule Take 2 capsules (0.8 mg total) by mouth daily. 01/02/22  Yes Johnson, Megan P, DO  albuterol (VENTOLIN HFA) 108 (90 Base) MCG/ACT  inhaler Inhale 2 puffs into the lungs every 6 (six) hours as needed for wheezing or shortness of breath. 02/27/21   Borders, Kirt Boys, NP  atorvastatin (LIPITOR) 40 MG tablet Take 1 tablet (40 mg total) by mouth daily. Patient not taking: Reported on 03/03/2022 11/04/21 02/02/22  Theora Gianotti, NP  polyethylene glycol (MIRALAX / GLYCOLAX) 17 g packet Take 17 g by mouth daily as needed. Patient not taking: Reported on 02/25/2022 11/24/20   Loletha Grayer, MD  predniSONE (DELTASONE) 10 MG tablet Take 10 mg by mouth daily with breakfast. Patient not taking: Reported on 03/03/2022    [provider]  SPS 15 GM/60ML suspension Take by mouth. Patient not taking: Reported on 02/25/2022 01/03/22   [provider]    Inpatient Medications: Scheduled Meds:  albuterol  2.5 mg Nebulization Q6H   allopurinol  100 mg Oral Daily   aspirin  81 mg Oral Daily   atorvastatin  40 mg Oral Daily   carvedilol  12.5 mg Oral BID WC   cholecalciferol  1,000 Units Oral Daily   clopidogrel  75 mg Oral Daily   DULoxetine  60 mg Oral Daily   ferrous sulfate  325 mg Oral Q breakfast   heparin  5,000 Units Subcutaneous Q8H   levETIRAcetam  500 mg Oral QHS   nortriptyline  25 mg Oral QHS   pantoprazole  20 mg Oral Daily   sodium chloride flush  3 mL Intravenous Q12H   sodium chloride flush  3 mL Intravenous Q12H   tamsulosin  0.8 mg Oral Daily   Continuous Infusions:  sodium chloride     PRN Meds: sodium chloride, acetaminophen, albuterol, albuterol, morphine injection, nitroGLYCERIN, sodium chloride flush  Allergies:    Allergies  Allergen Reactions   Losartan Shortness Of Breath    Social History:   Social History   Socioeconomic History   Marital status: Married    Spouse name: [name]   Number of children: Not on file   Years of education: Not on file   Highest education level: Not on file  Occupational History   Occupation: dt myelofibrosis    Comment: disabled since 2010   Tobacco Use   Smoking status: Former    Packs/day: 0.50    Years: 47.00    Total pack years: 23.50    Types: Cigarettes    Quit date: 09/11/2021    Years since  quitting: 0.4   Smokeless tobacco: Never   Tobacco comments:    trying to cut back-smokes 6 to 7 cigs per day  Vaping Use   Vaping Use: Never used  Substance and Sexual Activity   Alcohol use: Yes    Comment: occasionally   Drug use: No   Sexual activity: Yes    Birth control/protection: None  Other Topics Concern   Not on file  Social History Narrative   Patient lives with wife in his home.   Not working dt disability.    Has been placed on kidney transplant evaluation list, but does not qualify d/t smoking and myelofibrosis   Social Determinants of Health   Financial Resource Strain: Low Risk  (01/14/2021)   Overall Financial Resource Strain (CARDIA)    Difficulty of Paying Living Expenses: Not hard at all  Food Insecurity: No Food Insecurity (01/14/2021)   Hunger Vital Sign    Worried About Running Out of Food in the Last Year: Never true    Ran Out of Food in the Last Year: Never true  Transportation Needs: No Transportation Needs (01/14/2021)   PRAPARE - Hydrologist (Medical): No    Lack of Transportation (Non-Medical): No  Physical Activity: Inactive (01/14/2021)   Exercise Vital Sign    Days of Exercise per Week: 0 days    Minutes of Exercise per Session: 0 min  Stress: No Stress Concern Present (01/14/2021)   [place]    Feeling of Stress : Not at all  Social Connections: Unknown (07/28/2017)   Social Connection and Isolation Panel [NHANES]    Frequency of Communication with Friends and Family: Patient refused    Frequency of Social Gatherings with Friends and Family: Patient refused    Attends Religious Services: Patient refused    Active Member of Clubs or Organizations: Patient refused    Attends Theatre manager Meetings: Patient refused    Marital Status: Married  Human resources officer Violence: Unknown (07/28/2017)   Humiliation, Afraid, Rape, and Kick questionnaire    Fear of Current or Ex-Partner: Patient refused    Emotionally Abused: Patient refused    Physically Abused: Patient refused    Sexually Abused: Patient refused    Family History:    Family History  Problem Relation Age of Onset   Stroke Mother        Low BP stroke   Depression Father    Depression Sister        Breast   Heart disease Other    Breast cancer Other    Lung cancer Other    Ovarian cancer Other    Stomach cancer Other      ROS:  Please see the history of present illness.  Review of Systems  Constitutional:  Positive for malaise/fatigue.  HENT: Negative.    Eyes: Negative.   Respiratory:  Positive for shortness of breath.   Cardiovascular:  Positive for orthopnea.  Gastrointestinal:  Positive for abdominal pain.  Musculoskeletal: Negative.   Skin: Negative.   Neurological:  Positive for weakness.  Endo/Heme/Allergies: Negative.   Psychiatric/Behavioral: Negative.      All other ROS reviewed and negative.     Physical Exam/Data:   Vitals:   03/03/22 0800 03/03/22 0830 03/03/22 0900 03/03/22 0916  BP: 126/70 119/83 111/80   Pulse:  (!) 114 (!) 113 (!) 114  Resp: (!) 23 (!) 22  19  Temp:  TempSrc:      SpO2:  100% 96% 100%  Weight:      Height:        Intake/Output Summary (Last 24 hours) at 03/03/2022 1033 Last data filed at 03/03/2022 0505 Gross per 24 hour  Intake --  Output 400 ml  Net -400 ml      03/02/2022    8:53 PM 02/07/2022    9:59 AM 01/30/2022    9:34 AM  Last 3 Weights  Weight (lbs) 150 lb 152 lb 150 lb 3.2 oz  Weight (kg) 68.04 kg 68.947 kg 68.13 kg     Body mass index is 23.49 kg/m.  General:  Well nourished, well developed, in mild respiratory distress, sitting on the side of the stretcher in the emergency department HEENT: normal Neck: no JVD  appreciated  Vascular: No carotid bruits; Distal pulses 2+ bilaterally Cardiac:  normal S1, S2; RRR; II/VI systolic murmur, no rubs or gallops Lungs:  Coarse to auscultation bilaterally, with forced expiratory wheezing noted throughout, with rhonchi in the bilateral bases, respirations are dyspneic with talking on 2L O2  Abd: soft, nontender, no hepatomegaly, PD catheter intact Ext: no edema Musculoskeletal:  No deformities, BUE and BLE strength normal and equal Skin: warm and dry  Neuro:  CNs 2-12 intact, no focal abnormalities noted Psych:  Normal affect   EKG:  The EKG was personally reviewed and demonstrates:  ST rate 113, LVH early repolarization, ST depression in lateral leads Telemetry:  Telemetry was personally reviewed and demonstrates:  ST rate 102, LVH, early repol  Relevant CV Studies: Echocardiogram completed on 12/26/2021  1. Left ventricular ejection fraction, by estimation, is 30 to 35%. Left  ventricular ejection fraction by 2D MOD biplane is 32.0 %. The left  ventricle has moderate to severely decreased function. The left ventricle  demonstrates global hypokinesis. The  left ventricular internal cavity size was moderately dilated. Left  ventricular diastolic parameters are consistent with Grade II diastolic  dysfunction (pseudonormalization). The average left ventricular global  longitudinal strain is -11.5 %. The global  longitudinal strain is abnormal.   2. Right ventricular systolic function is low normal. The right  ventricular size is mildly enlarged.   3. Left atrial size was severely dilated.   4. The mitral valve is normal in structure. Moderate mitral valve  regurgitation.   5. Mean aortic valve gradient 76mHg, Peak gradient 37mg, Vmax 2.16m16m  AVA 1.2cm2. . The aortic valve was not well visualized. Aortic valve  regurgitation is mild. Moderate aortic valve stenosis.   6. The inferior vena cava is normal in size with greater than 50%  respiratory  variability, suggesting right atrial pressure of 3 mmHg.   Left heart cath with PCI and DES placement completed 09/18/2021 1. Left dominant system with diffuse coronary calcification.  2. 99% proximal LAD in-stent stenosis with an area of very proximal 70%.  3. Successful IVUS guided PCI of the LAD initially with laser atherectomy using a 1.4 mm catheter at 50/40 followed by deployment of a 3.0 x 48 mm Synergy.  4. The proximal circumflex has a 40% narrowing at the take off of OM-1 and 2. The mid circumflex has a focal 75% calcified stenosis and just distal a 50% narrowing of about 15 mm at the take off a large, third OM.  5. Successful IVUS guided PCI of the mid circumflex with a 3.5 x 16 mm Synergy DES.  6. Initial LVEDP of 10 mmHg   Left Heart cath completed  on 09/12/2022   1st Mrg lesion is 50% stenosed.   Dist Cx lesion is 95% stenosed.   Prox Cx to Mid Cx lesion is 40% stenosed.   Prox LAD to Mid LAD lesion is 95% stenosed.   Prox LAD lesion is 60% stenosed.   Prox RCA lesion is 40% stenosed.   Mid RCA lesion is 100% stenosed.   1.  Left dominant coronary arteries with severe three-vessel coronary artery disease.  The coronary arteries are heavily calcified especially the LAD. 2.  Left ventricular angiography was not performed to preserve contrast given residual renal function.  EF was moderately reduced by echo. 3.  Mildly elevated left ventricular end-diastolic pressure at 19 mmHg.   Recommendations: Given heavily calcified three-vessel coronary artery disease and cardiomyopathy, best option is CABG if the patient is deemed to be a candidate considering his comorbidities.  Otherwise, high risk PCI to the LAD and left circumflex can be considered with atherectomy to the LAD.  Echocardiogram completed 09/12/2022 1. Left ventricular ejection fraction, by estimation, is 35 to 40%. The  left ventricle has moderately decreased function. The left ventricle  demonstrates global  hypokinesis. The left ventricular internal cavity size  was mildly dilated. Left ventricular  diastolic parameters are consistent with Grade II diastolic dysfunction  (pseudonormalization). The average left ventricular global longitudinal  strain is -9.8 %. The global longitudinal strain is abnormal.   2. Right ventricular systolic function is normal. The right ventricular  size is normal.   3. Left atrial size was mildly dilated.   4. The mitral valve is normal in structure. Mild mitral valve  regurgitation. No evidence of mitral stenosis.   5. The aortic valve is calcified. Aortic valve regurgitation is not  visualized. Mild aortic valve stenosis. Aortic valve area, by VTI measures  1.73 cm. Aortic valve mean gradient measures 14.0 mmHg.   6. The inferior vena cava is dilated in size with <50% respiratory  variability, suggesting right atrial pressure of 15 mmHg.   Laboratory Data:  High Sensitivity Troponin:   Recent Labs  Lab 03/02/22 2105 03/02/22 2330 03/03/22 0414  TROPONINIHS 308* 320* 285*     Chemistry Recent Labs  Lab 02/25/22 1257 03/02/22 2105 03/03/22 0414  NA 138 139 139  K 3.8 3.5 3.4*  CL 101 102 104  CO2 '24 25 22  '$ GLUCOSE 140* 152* 106*  BUN 69* 58* 60*  CREATININE 7.23* 7.29* 7.40*  CALCIUM 7.7* 7.7* 7.4*  GFRNONAA 8* 8* 8*  ANIONGAP '13 12 13    '$ Recent Labs  Lab 02/25/22 1257 03/02/22 2105 03/03/22 0414  PROT 6.0* 6.4* 5.7*  ALBUMIN 2.9* 3.1* 2.8*  AST 12* 13* 13*  ALT '8 11 11  '$ ALKPHOS 44 61 53  BILITOT 0.4 0.8 0.8   Lipids No results for input(s): "CHOL", "TRIG", "HDL", "LABVLDL", "LDLCALC", "CHOLHDL" in the last 168 hours.  Hematology Recent Labs  Lab 02/25/22 1257 03/02/22 2105 03/03/22 0414  WBC 5.5 7.5 7.9  RBC 2.46* 3.14* 2.92*  HGB 7.1* 8.8* 8.4*  HCT 22.9* 29.2* 27.1*  MCV 93.1 93.0 92.8  MCH 28.9 28.0 28.8  MCHC 31.0 30.1 31.0  RDW 16.1* 16.2* 16.4*  PLT 148* 170 168   Thyroid No results for input(s): "TSH", "FREET4"  in the last 168 hours.  BNP Recent Labs  Lab 03/02/22 2105  BNP 1,667.5*    DDimer No results for input(s): "DDIMER" in the last 168 hours.   Radiology/Studies:  [facility] Port 1 [address]  Result Date: 03/02/2022 CLINICAL DATA:  Shortness of breath. EXAM: PORTABLE CHEST 1 VIEW COMPARISON:  Chest radiograph dated 12/22/2021 and CT dated 12/22/2021. FINDINGS: There is vascular congestion. Diffuse interstitial and interlobular septal prominence consistent with edema. Superimposed pneumonia is not excluded. No large pleural effusion. No pneumothorax. The cardiac silhouette is within limits. Atherosclerotic calcification of the aorta. Left shoulder arthroplasty. Degenerative changes of the right shoulder. No acute osseous pathology. IMPRESSION: Congestive heart failure.  Superimposed pneumonia is not excluded. Electronically Signed   By: Anner Crete M.D.   On: 03/02/2022 21:43     Assessment and Plan:   Acute on chronic combined systolic and diastolic congestive heart failure -BNP 1667.5 -continued shortness of breath on 2L O2 via [place], chest x-ray revealed congestion, rhonchi on exam -patient given 60 mg of lasix with minimal urine output -echocardiogram in April revealed EF 30-35% -recommend continued lasix 40 mg IV BID, patient still makes urine -daily weight, strict I & O, low sodium diet -continue coreg 12.5 mg bid -was intolerant to losartan previously -no current ACE/ARB/ARNI -since patient has end-stage renal disease on PD will defer from challenging with aldosterone antagonist and SGLT2 inhibitor -limited echocardiogram    2. Elevated Hs Troponins likely in the setting of demand ischemia from CHF with elevated BNP, hypoxia and shortness of breath, anemia, and end-stage renal -patient remains pain free -no EKG changes noted for ischemia -Hs troponins trended 308,320,285 -will re-evaluate if patient needs further ischemic work-up after acute failure as improved/resolved  3.  Coronary artery disease -extensive history of CAD with work-up for CABG -last PCI 09/18/22 at [place] -continue asa 81 mg daily -continue plavix 75 mg daily -currently chest pain free -continue cardiac monitor -EKG PRN  4. End stage renal disease on peritoneal dialysis -Nephrology following -continue PD   5. Hyperlipidemia -continue atorvastatin 40 mg daily -LDL 81 09/18/2022  6. 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

## 2022-03-03 NOTE — Progress Notes (Signed)
Admission profile updated. ?

## 2022-03-03 NOTE — ED Notes (Signed)
Nephrologist at bedside

## 2022-03-03 NOTE — Assessment & Plan Note (Signed)
Stable. Cont with PRN albuterol and duoneb.

## 2022-03-03 NOTE — Assessment & Plan Note (Signed)
Pt sees nephrology Dr. Holley Raring.  Cont PD per routine. Nephrology consult.

## 2022-03-03 NOTE — Progress Notes (Addendum)
Progress Note    James Arnold  TXM:[id number] DOB: 04-15-1956  DOA: 03/02/2022 PCP: Valerie Roys, DO      Brief Narrative:    Medical records reviewed and are as summarized below:  James Arnold is a 66 y.o. male with medical history significant for chronic systolic and diastolic CHF, CAD s/p coronary stents (s/p PCI of mid circumflex in December 2022), ESRD on peritoneal dialysis, COPD, chronic anemia, myelofibrosis, hypertension, history of stroke.  He presented to the hospital because of shortness of breath of about 2 days duration.  He also reported mid sternal chest pain which has been intermittent.        Assessment/Plan:   Principal Problem:   SOB (shortness of breath) Active Problems:   CAD (coronary artery disease)   Acute on chronic combined systolic and diastolic CHF (congestive heart failure) (HCC)   ESRD on peritoneal dialysis (HCC)   Anemia of chronic kidney failure, stage 5 (HCC)   Myelofibrosis (HCC)   COPD (chronic obstructive pulmonary disease) (HCC)   Body mass index is 23.49 kg/m.   Acute on chronic combined systolic and diastolic CHF: Continue IV Lasix.  Monitor BMP, daily weight and urine output.  2D echo on 12/25/2021 showed EF estimated at 30 to 48%, grade 2 diastolic dysfunction, severely dilated left atrium, moderate MR, moderate aortic stenosis  Elevated troponin, chest pain, CAD s/p PCI of mid circumflex in December 2022.  Case discussed with Dr. Fletcher Anon, cardiologist.  No plan for cardiac cath at this time.  Continue aspirin, Plavix and Lipitor.  Hypokalemia: Defer treatment to nephrologist  ESRD, anemia of chronic disease: Continue peritoneal dialysis while inpatient.  Follow-up with nephrologist.  He was recently transfused with 1 unit of PRBCs on 02/25/2022.  Other comorbidities include COPD, myelofibrosis, history of stroke, hypertension       Diet Order             Diet renal with fluid restriction Fluid restriction: 1200 mL  Fluid; Room service appropriate? Yes; Fluid consistency: Thin  Diet effective now                            Consultants: Cardiologist, nephrologist  Procedures: None    Medications:    albuterol  2.5 mg Nebulization Q6H   allopurinol  100 mg Oral Daily   aspirin  81 mg Oral Daily   atorvastatin  40 mg Oral Daily   carvedilol  6.25 mg Oral BID WC   cholecalciferol  1,000 Units Oral Daily   clopidogrel  75 mg Oral Daily   DULoxetine  60 mg Oral Daily   ferrous sulfate  325 mg Oral Q breakfast   furosemide  80 mg Intravenous Daily   heparin  5,000 Units Subcutaneous Q8H   levETIRAcetam  500 mg Oral QHS   midodrine  5 mg Oral TID WC   nortriptyline  25 mg Oral QHS   pantoprazole  20 mg Oral Daily   sodium chloride flush  3 mL Intravenous Q12H   sodium chloride flush  3 mL Intravenous Q12H   tamsulosin  0.8 mg Oral Daily   Continuous Infusions:  sodium chloride       Anti-infectives (From admission, onward)    None              Family Communication/Anticipated D/C date and plan/Code Status   DVT prophylaxis: heparin injection 5,000 Units Start: 03/03/22 0600  Code Status: Full Code  Family Communication: None Disposition Plan: Plan to discharge home in 1 to 2 days   Status is: Observation The patient will require care spanning > 2 midnights and should be moved to inpatient because: Chest pain and shortness of breath       Subjective:   Interval events noted.  He complains of midsternal chest pain and shortness of breath.  Objective:    Vitals:   03/03/22 1145 03/03/22 1200 03/03/22 1230 03/03/22 1300  BP:      Pulse:  (!) 107    Resp: 20  (!) 21 (!) 24  Temp:      TempSrc:      SpO2:  94%    Weight:      Height:       No data found.   Intake/Output Summary (Last 24 hours) at 03/03/2022 1318 Last data filed at 03/03/2022 0505 Gross per 24 hour  Intake --  Output 400 ml  Net -400 ml   Filed Weights   03/02/22  2053  Weight: 68 kg    Exam:  GEN: NAD SKIN: No rash EYES: EOMI ENT: MMM CV: RRR PULM: CTA B ABD: soft, ND, NT, +BS CNS: AAO x 3, non focal EXT: No edema or tenderness        Data Reviewed:   I have personally reviewed following labs and imaging studies:  Labs: Labs show the following:   Basic Metabolic Panel: Recent Labs  Lab 02/25/22 1257 03/02/22 2105 03/03/22 0414  NA 138 139 139  K 3.8 3.5 3.4*  CL 101 102 104  CO2 '24 25 22  '$ GLUCOSE 140* 152* 106*  BUN 69* 58* 60*  CREATININE 7.23* 7.29* 7.40*  CALCIUM 7.7* 7.7* 7.4*   GFR Estimated Creatinine Clearance: 9.3 mL/min (A) (by C-G formula based on SCr of 7.4 mg/dL (H)). Liver Function Tests: Recent Labs  Lab 02/25/22 1257 03/02/22 2105 03/03/22 0414  AST 12* 13* 13*  ALT '8 11 11  '$ ALKPHOS 44 61 53  BILITOT 0.4 0.8 0.8  PROT 6.0* 6.4* 5.7*  ALBUMIN 2.9* 3.1* 2.8*   No results for input(s): "LIPASE", "AMYLASE" in the last 168 hours. No results for input(s): "AMMONIA" in the last 168 hours. Coagulation profile No results for input(s): "INR", "PROTIME" in the last 168 hours.  CBC: Recent Labs  Lab 02/25/22 1257 03/02/22 2105 03/03/22 0414  WBC 5.5 7.5 7.9  NEUTROABS 4.9  --   --   HGB 7.1* 8.8* 8.4*  HCT 22.9* 29.2* 27.1*  MCV 93.1 93.0 92.8  PLT 148* 170 168   Cardiac Enzymes: No results for input(s): "CKTOTAL", "CKMB", "CKMBINDEX", "TROPONINI" in the last 168 hours. BNP (last 3 results) No results for input(s): "PROBNP" in the last 8760 hours. CBG: No results for input(s): "GLUCAP" in the last 168 hours. D-Dimer: No results for input(s): "DDIMER" in the last 72 hours. Hgb A1c: No results for input(s): "HGBA1C" in the last 72 hours. Lipid Profile: No results for input(s): "CHOL", "HDL", "LDLCALC", "TRIG", "CHOLHDL", "LDLDIRECT" in the last 72 hours. Thyroid function studies: No results for input(s): "TSH", "T4TOTAL", "T3FREE", "THYROIDAB" in the last 72 hours.  Invalid input(s):  "FREET3" Anemia work up: No results for input(s): "VITAMINB12", "FOLATE", "FERRITIN", "TIBC", "IRON", "RETICCTPCT" in the last 72 hours. Sepsis Labs: Recent Labs  Lab 02/25/22 1257 03/02/22 2105 03/03/22 0414  WBC 5.5 7.5 7.9    Microbiology No results found for this or any previous visit (from the past 240  hour(s)).  Procedures and diagnostic studies:  DG Chest Port 1 View  Result Date: 03/02/2022 CLINICAL DATA:  Shortness of breath. EXAM: PORTABLE CHEST 1 VIEW COMPARISON:  Chest radiograph dated 12/22/2021 and CT dated 12/22/2021. FINDINGS: There is vascular congestion. Diffuse interstitial and interlobular septal prominence consistent with edema. Superimposed pneumonia is not excluded. No large pleural effusion. No pneumothorax. The cardiac silhouette is within limits. Atherosclerotic calcification of the aorta. Left shoulder arthroplasty. Degenerative changes of the right shoulder. No acute osseous pathology. IMPRESSION: Congestive heart failure.  Superimposed pneumonia is not excluded. Electronically Signed   By: Anner Crete M.D.   On: 03/02/2022 21:43               LOS: 0 days   Saiquan Hands  [facility]   Pager on [email or web address]. If 7PM-7AM, please contact night-coverage at [email or web address]     03/03/2022, 1:18 PM

## 2022-03-03 NOTE — ED Notes (Signed)
Pt back on stretcher, stretcher locked low, rail up, call bell and personal items within reach, when asked about if any chest pain pt reports same mild chest "soreness" as earlier in day per pt. Pt denies use of home supplemental O2. Currently remains on 2L; resp reg/unlabored.

## 2022-03-03 NOTE — ED Notes (Signed)
Pt c/o being uncomfortable laying on stretcher; pt sitting on side of stretcher with stretcher locked low; secretary ordering centrella bed for pt for this RN. Pt denies pain or any other needs currently. Pt gave verbal permission for this RN to update his wife when able as wife called Network engineer earlier wanting update.

## 2022-03-03 NOTE — Progress Notes (Signed)
[facility]  ROUNDING NOTE   Subjective:   James Arnold is a 66 y.o male with past medical history of anemia, COPD, heart disease, and ESRD. Patient presents to ED with complains of shortness of breath and chest pressure. He has been admitted for SOB (shortness of breath) [R06.02] Acute on chronic combined systolic and diastolic CHF (congestive heart failure) ([place]) [I50.43]   Patient is known to our clinic and receives peritoneal dialysis follow up at [facility] under Dr Holley Raring. He states he has maintained all nightly treatments except last night. He states he has taken his Furosemide as needed. Reports he maintains fluid restriction. Denies nausea, vomiting and diarrhea. Endorses dry cough, baseline. Seen on 2L [place], room air at baseline.   Labs on ED arrival include glucose 152, BUN 58, calcium 7.7, albumin 3.1, creatinine 7.29 with GFR 8.  Troponin 308.  Chest x-ray shows congestive heart failure with possible pneumonia.  We have been consulted to manage dialysis needs during this admission.   Objective:  Vital signs in last 24 hours:  Temp:  [97.7 F (36.5 C)] 97.7 F (36.5 C) (06/11 2052) Pulse Rate:  [102-119] 114 (06/12 0916) Resp:  [15-40] 19 (06/12 0916) BP: (109-131)/(66-109) 111/80 (06/12 0900) SpO2:  [90 %-100 %] 100 % (06/12 0916) Weight:  [68 kg] 68 kg (06/11 2053)  Weight change:  Filed Weights   03/02/22 2053  Weight: 68 kg    Intake/Output: I/O last 3 completed shifts: In: -  Out: 400 [Urine:400]   Intake/Output this shift:  No intake/output data recorded.  Physical Exam: General: NAD, sitting at bedside  Head: Normocephalic, atraumatic. Moist oral mucosal membranes  Eyes: Anicteric  Lungs:  Rhonchi throughout, orthopneic, normal effort, [place] O2  Heart: Regular rate and rhythm  Abdomen:  Soft, nontender, PDC  Extremities:  1+ peripheral edema.  Neurologic: Nonfocal, moving all four extremities  Skin: No lesions  Access: PD catheter     Basic Metabolic Panel: Recent Labs  Lab 02/25/22 1257 03/02/22 2105 03/03/22 0414  NA 138 139 139  K 3.8 3.5 3.4*  CL 101 102 104  CO2 '24 25 22  '$ GLUCOSE 140* 152* 106*  BUN 69* 58* 60*  CREATININE 7.23* 7.29* 7.40*  CALCIUM 7.7* 7.7* 7.4*    Liver Function Tests: Recent Labs  Lab 02/25/22 1257 03/02/22 2105 03/03/22 0414  AST 12* 13* 13*  ALT '8 11 11  '$ ALKPHOS 44 61 53  BILITOT 0.4 0.8 0.8  PROT 6.0* 6.4* 5.7*  ALBUMIN 2.9* 3.1* 2.8*   No results for input(s): "LIPASE", "AMYLASE" in the last 168 hours. No results for input(s): "AMMONIA" in the last 168 hours.  CBC: Recent Labs  Lab 02/25/22 1257 03/02/22 2105 03/03/22 0414  WBC 5.5 7.5 7.9  NEUTROABS 4.9  --   --   HGB 7.1* 8.8* 8.4*  HCT 22.9* 29.2* 27.1*  MCV 93.1 93.0 92.8  PLT 148* 170 168    Cardiac Enzymes: No results for input(s): "CKTOTAL", "CKMB", "CKMBINDEX", "TROPONINI" in the last 168 hours.  BNP: Invalid input(s): "POCBNP"  CBG: No results for input(s): "GLUCAP" in the last 168 hours.  Microbiology: Results for orders placed or performed in visit on 01/08/22  Microscopic Examination     Status: None   Collection Time: 01/08/22  9:01 AM   Urine  Result Value Ref Range Status   WBC, UA 0-5 0 - 5 /hpf Final   RBC 0-2 0 - 2 /hpf Final   Epithelial Cells (  non renal) 0-10 0 - 10 /hpf Final   Bacteria, UA None seen None seen/Few Final    Coagulation Studies: No results for input(s): "LABPROT", "INR" in the last 72 hours.  Urinalysis: No results for input(s): "COLORURINE", "LABSPEC", "PHURINE", "GLUCOSEU", "HGBUR", "BILIRUBINUR", "KETONESUR", "PROTEINUR", "UROBILINOGEN", "NITRITE", "LEUKOCYTESUR" in the last 72 hours.  Invalid input(s): "APPERANCEUR"    Imaging: DG Chest Port 1 View  Result Date: 03/02/2022 CLINICAL DATA:  Shortness of breath. EXAM: PORTABLE CHEST 1 VIEW COMPARISON:  Chest radiograph dated 12/22/2021 and CT dated 12/22/2021. FINDINGS: There is vascular  congestion. Diffuse interstitial and interlobular septal prominence consistent with edema. Superimposed pneumonia is not excluded. No large pleural effusion. No pneumothorax. The cardiac silhouette is within limits. Atherosclerotic calcification of the aorta. Left shoulder arthroplasty. Degenerative changes of the right shoulder. No acute osseous pathology. IMPRESSION: Congestive heart failure.  Superimposed pneumonia is not excluded. Electronically Signed   By: Anner Crete M.D.   On: 03/02/2022 21:43     Medications:    sodium chloride      albuterol  2.5 mg Nebulization Q6H   allopurinol  100 mg Oral Daily   aspirin  81 mg Oral Daily   atorvastatin  40 mg Oral Daily   carvedilol  12.5 mg Oral BID WC   cholecalciferol  1,000 Units Oral Daily   clopidogrel  75 mg Oral Daily   DULoxetine  60 mg Oral Daily   ferrous sulfate  325 mg Oral Q breakfast   furosemide  80 mg Intravenous Daily   heparin  5,000 Units Subcutaneous Q8H   levETIRAcetam  500 mg Oral QHS   nortriptyline  25 mg Oral QHS   pantoprazole  20 mg Oral Daily   sodium chloride flush  3 mL Intravenous Q12H   sodium chloride flush  3 mL Intravenous Q12H   tamsulosin  0.8 mg Oral Daily   sodium chloride, acetaminophen, albuterol, albuterol, morphine injection, nitroGLYCERIN, sodium chloride flush  Assessment/ Plan:  James Arnold is a 66 y.o.  male with past medical history of anemia, COPD, heart disease, and ESRD. Patient presents to ED with complains of shortness of breath and chest pressure. He has been admitted for Acute pulmonary edema (HCC) [J81.0] SOB (shortness of breath) [R06.02] Elevated troponin [R77.8] Acute on chronic combined systolic and diastolic CHF (congestive heart failure) (HCC) [I50.43]   End-stage renal disease on peritoneal dialysis.  Will maintain nightly treatments during this admission.  We will perform treatment tonight on 2.5 dialysate.  We will heparinize back as needed for presence of  fibrin.  2. Anemia of chronic kidney disease Lab Results  Component Value Date   HGB 8.4 (L) 03/03/2022  Receiving oral iron supplementation. Hemoglobin below desired target, patient receives [place] outpatient.  3. Secondary Hyperparathyroidism: Lab Results  Component Value Date   CALCIUM 7.4 (L) 03/03/2022   CAION 1.02 (L) 11/19/2020   PHOS 7.8 (H) 11/22/2020    Calcium and phosphorus not within desired target.  Continue cholecalciferol.  4.  Acute on chronic combined systolic and diastolic heart failure.  Resumed furosemide 80 mg IV daily.  2D echo from 12/25/2021 shows EF 30 to 35% with a grade 2 diastolic dysfunction and severely dilated left atrium.  Moderate mitral regurg and moderate aortic stenosis also noted on echo.   LOS: 0 James Arnold 6/12/202310:34 AM

## 2022-03-03 NOTE — Consult Note (Addendum)
   Heart Failure Nurse Navigator Note  HFrEF 30-35%.  Moderate to severe global hypokinesis.  Moderately dilated internal cavity of the left ventricle.  2 diastolic dysfunction.  Longitudinal strain is -11.5%.  Right ventricle is mildly enlarged.  Severe left atrial enlargement.  Mild to moderate mitral regurgitation.  Moderate aortic insufficiency.  Moderate aortic valve stenosis.   He presented to the emergency room with complaints of worsening shortness of breath and chest pressure.  Comorbidities:  Coronary artery disease with previous PCI and stenting COPD Hypertension Stroke Anemia Depression Hyperlipidemia Chronic kidney disease on peritoneal dialysis Tobacco abuse  Labs:  BNP 1667, troponin 308, sodium 139, potassium 3.4, chloride 104, CO2 22, BUN 60, creatinine 7.4. Weight is 68 kg Blood pressure 98/66   Initial meeting with patient in the emergency room.  He was lying on a gurney sitting upright still becomes short of breath with conversation.  He states that he lives at home with his wife.  Been compliant with his peritoneal dialysis runs along with his medications.  Discussing diet he states that he continues to use salt at the table.  Explained the relationship between salt/sodium and fluids.  He did not feel that he drank any more than 32 ounces daily.  He states that he had been compliant with his daily weights and states that 1 day it might be up a pound and then the next day down a pound.  Discussed the ventricle health program with the patient but he does not have a smart phone nor does he text or email.  Wife was not present and he was unsure as to the type of phone that his wife had.  We will continue to follow his his last admissions this year have been April 3 and February 27.  He has follow-up in the outpatient heart failure clinic on July 3 at 9:30 [place]

## 2022-03-03 NOTE — ED Notes (Addendum)
Pt assisted to switch from stretcher to centrella bed.

## 2022-03-03 NOTE — ED Notes (Signed)
Secure chat sent to MD: "Pt reports pressure at R side of chest is increasing in intensity - rates pressure with inspiration 7/10. Capured and exported EKG as a precaution."

## 2022-03-04 DIAGNOSIS — N186 End stage renal disease: Secondary | ICD-10-CM | POA: Diagnosis not present

## 2022-03-04 DIAGNOSIS — I248 Other forms of acute ischemic heart disease: Secondary | ICD-10-CM

## 2022-03-04 DIAGNOSIS — D7581 Myelofibrosis: Secondary | ICD-10-CM | POA: Diagnosis not present

## 2022-03-04 DIAGNOSIS — R0602 Shortness of breath: Secondary | ICD-10-CM | POA: Diagnosis not present

## 2022-03-04 DIAGNOSIS — I5043 Acute on chronic combined systolic (congestive) and diastolic (congestive) heart failure: Secondary | ICD-10-CM | POA: Diagnosis not present

## 2022-03-04 DIAGNOSIS — I5023 Acute on chronic systolic (congestive) heart failure: Secondary | ICD-10-CM

## 2022-03-04 LAB — BASIC METABOLIC PANEL
Anion gap: 12 (ref 5–15)
BUN: 59 mg/dL — ABNORMAL HIGH (ref 8–23)
CO2: 24 mmol/L (ref 22–32)
Calcium: 8.3 mg/dL — ABNORMAL LOW (ref 8.9–10.3)
Chloride: 102 mmol/L (ref 98–111)
Creatinine, Ser: 6.87 mg/dL — ABNORMAL HIGH (ref 0.61–1.24)
GFR, Estimated: 8 mL/min — ABNORMAL LOW (ref >60)
Glucose, Bld: 142 mg/dL — ABNORMAL HIGH (ref 70–99)
Potassium: 3.1 mmol/L — ABNORMAL LOW (ref 3.5–5.1)
Sodium: 138 mmol/L (ref 135–145)

## 2022-03-04 LAB — D-DIMER, QUANTITATIVE: D-Dimer, Quant: 0.61 ug/mL-FEU — ABNORMAL HIGH (ref 0.00–0.50)

## 2022-03-04 LAB — MAGNESIUM: Magnesium: 1.7 mg/dL (ref 1.7–2.4)

## 2022-03-04 MED ORDER — POTASSIUM CHLORIDE CRYS ER 20 MEQ PO TBCR
40.0000 meq | EXTENDED_RELEASE_TABLET | Freq: Once | ORAL | Status: AC
Start: 2022-03-04 — End: 2022-03-04
  Administered 2022-03-04: 40 meq via ORAL
  Filled 2022-03-04: qty 2

## 2022-03-04 NOTE — Progress Notes (Signed)
PD exit site clean, dry and intact. New dressing done. PD initiated. Cycler went into first fill without any issue.

## 2022-03-04 NOTE — Progress Notes (Signed)
Progress Note    ICHAEL Arnold  BWI:[id number] DOB: 07-01-1956  DOA: 03/02/2022 PCP: Valerie Roys, DO      Brief Narrative:    Medical records reviewed and are as summarized below:  James Arnold is a 66 y.o. male with medical history significant for chronic systolic and diastolic CHF, CAD s/p coronary stents (s/p PCI of mid circumflex in December 2022), ESRD on peritoneal dialysis, COPD, chronic anemia, myelofibrosis, hypertension, history of stroke.  He presented to the hospital because of shortness of breath of about 2 days duration.  He also reported mid sternal chest pain which has been intermittent.        Assessment/Plan:   Principal Problem:   SOB (shortness of breath) Active Problems:   CAD (coronary artery disease)   Acute on chronic combined systolic and diastolic CHF (congestive heart failure) (HCC)   ESRD on peritoneal dialysis (HCC)   Anemia of chronic kidney failure, stage 5 (HCC)   Myelofibrosis (HCC)   COPD (chronic obstructive pulmonary disease) (HCC)   Body mass index is 22.1 kg/m.   Acute on chronic combined systolic and diastolic CHF: Continue IV Lasix.  Monitor daily weight, BMP and urine output. 2D echo on 12/25/2021 showed EF estimated at 30 to 63%, grade 2 diastolic dysfunction, severely dilated left atrium, moderate MR, moderate aortic stenosis  Elevated troponin, chest pain, CAD s/p PCI of mid circumflex in December 2022.  Continue aspirin and Plavix.  Lipitor has been discontinued because of history of myalgia on Lipitor.  Cardiology to consider alternate statins versus PCSK9 inhibitors in the outpatient setting.  Hypokalemia: Replete potassium and monitor levels.  ESRD, anemia of chronic disease: Continue peritoneal dialysis while inpatient.  Follow-up with nephrologist.  He was recently transfused with 1 unit of PRBCs on 02/25/2022.  Other comorbidities include COPD, myelofibrosis, history of stroke, hypertension  Discussed goals of  care with the patient.  He wanted to have further discussions with his wife before making any decisions about CODE STATUS.  Palliative care has been consulted for further discussions regarding goals of care.        Diet Order             Diet renal with fluid restriction Fluid restriction: 1200 mL Fluid; Room service appropriate? Yes; Fluid consistency: Thin  Diet effective now                            Consultants: Cardiologist, nephrologist  Procedures: None    Medications:    albuterol  2.5 mg Nebulization Q6H   allopurinol  100 mg Oral Daily   aspirin  81 mg Oral Daily   carvedilol  6.25 mg Oral BID WC   cholecalciferol  1,000 Units Oral Daily   clopidogrel  75 mg Oral Daily   DULoxetine  60 mg Oral Daily   ferrous sulfate  325 mg Oral Q breakfast   furosemide  80 mg Intravenous Daily   gentamicin cream  1 application  Topical Daily   heparin  5,000 Units Subcutaneous Q8H   levETIRAcetam  500 mg Oral QHS   midodrine  5 mg Oral TID WC   nortriptyline  25 mg Oral QHS   pantoprazole  20 mg Oral Daily   potassium chloride  40 mEq Oral Once   sodium chloride flush  3 mL Intravenous Q12H   sodium chloride flush  3 mL Intravenous Q12H   tamsulosin  0.8 mg Oral Daily   Continuous Infusions:  sodium chloride     dialysis solution 2.5% low-MG/low-CA       Anti-infectives (From admission, onward)    None              Family Communication/Anticipated D/C date and plan/Code Status   DVT prophylaxis: heparin injection 5,000 Units Start: 03/03/22 0600     Code Status: Full Code  Family Communication: None Disposition Plan: Plan to discharge home in 1 to 2 days   Status is: Observation The patient will require care spanning > 2 midnights and should be moved to inpatient because: Chest pain and shortness of breath       Subjective:   Interval events noted.  He still feels a little short of breath but overall he feels much better.  No  chest pain.  He had peritoneal dialysis last night.  Objective:    Vitals:   03/04/22 0500 03/04/22 0751 03/04/22 0802 03/04/22 1200  BP:  101/68 102/71 94/62  Pulse:  (!) 108 (!) 107 (!) 106  Resp:  '18 16 18  '$ Temp:  98.1 F (36.7 C) 98 F (36.7 C) 97.8 F (36.6 C)  TempSrc:  Oral Oral Oral  SpO2:  97% 97% 97%  Weight: 68 kg  64 kg   Height: '5\' 7"'$  (1.702 m)      No data found.   Intake/Output Summary (Last 24 hours) at 03/04/2022 1327 Last data filed at 03/04/2022 1201 Gross per 24 hour  Intake 15120 ml  Output 16346 ml  Net -1226 ml   Filed Weights   03/03/22 2037 03/04/22 0500 03/04/22 0802  Weight: 70.8 kg 68 kg 64 kg    Exam:  GEN: NAD SKIN: No rash EYES: EOMI ENT: MMM CV: RRR, tachycardic PULM: Decreased air entry bilaterally, bilateral rhonchi ABD: soft, ND, NT, +BS CNS: AAO x 3, non focal EXT: No edema or tenderness        Data Reviewed:   I have personally reviewed following labs and imaging studies:  Labs: Labs show the following:   Basic Metabolic Panel: Recent Labs  Lab 03/02/22 2105 03/03/22 0414 03/04/22 0447  NA 139 139 138  K 3.5 3.4* 3.1*  CL 102 104 102  CO2 '25 22 24  '$ GLUCOSE 152* 106* 142*  BUN 58* 60* 59*  CREATININE 7.29* 7.40* 6.87*  CALCIUM 7.7* 7.4* 8.3*  MG  --  1.8 1.7   GFR Estimated Creatinine Clearance: 9.7 mL/min (A) (by C-G formula based on SCr of 6.87 mg/dL (H)). Liver Function Tests: Recent Labs  Lab 03/02/22 2105 03/03/22 0414  AST 13* 13*  ALT 11 11  ALKPHOS 61 53  BILITOT 0.8 0.8  PROT 6.4* 5.7*  ALBUMIN 3.1* 2.8*   No results for input(s): "LIPASE", "AMYLASE" in the last 168 hours. No results for input(s): "AMMONIA" in the last 168 hours. Coagulation profile No results for input(s): "INR", "PROTIME" in the last 168 hours.  CBC: Recent Labs  Lab 03/02/22 2105 03/03/22 0414  WBC 7.5 7.9  HGB 8.8* 8.4*  HCT 29.2* 27.1*  MCV 93.0 92.8  PLT 170 168   Cardiac Enzymes: No results for  input(s): "CKTOTAL", "CKMB", "CKMBINDEX", "TROPONINI" in the last 168 hours. BNP (last 3 results) No results for input(s): "PROBNP" in the last 8760 hours. CBG: No results for input(s): "GLUCAP" in the last 168 hours. D-Dimer: Recent Labs    03/04/22 0447  DDIMER 0.61*   Hgb A1c: No results for  input(s): "HGBA1C" in the last 72 hours. Lipid Profile: No results for input(s): "CHOL", "HDL", "LDLCALC", "TRIG", "CHOLHDL", "LDLDIRECT" in the last 72 hours. Thyroid function studies: No results for input(s): "TSH", "T4TOTAL", "T3FREE", "THYROIDAB" in the last 72 hours.  Invalid input(s): "FREET3" Anemia work up: No results for input(s): "VITAMINB12", "FOLATE", "FERRITIN", "TIBC", "IRON", "RETICCTPCT" in the last 72 hours. Sepsis Labs: Recent Labs  Lab 03/02/22 2105 03/03/22 0414  WBC 7.5 7.9    Microbiology No results found for this or any previous visit (from the past 240 hour(s)).  Procedures and diagnostic studies:  DG Chest Port 1 View  Result Date: 03/03/2022 CLINICAL DATA:  Tachypnea EXAM: PORTABLE CHEST 1 VIEW COMPARISON:  03/02/2022 FINDINGS: Diffuse interstitial prominence throughout the lungs. Heart is borderline in size. No effusions. No acute bony abnormality. IMPRESSION: Diffuse interstitial prominence, likely edema/CHF. Electronically Signed   By: Rolm Baptise M.D.   On: 03/03/2022 21:07   DG Chest Port 1 View  Result Date: 03/02/2022 CLINICAL DATA:  Shortness of breath. EXAM: PORTABLE CHEST 1 VIEW COMPARISON:  Chest radiograph dated 12/22/2021 and CT dated 12/22/2021. FINDINGS: There is vascular congestion. Diffuse interstitial and interlobular septal prominence consistent with edema. Superimposed pneumonia is not excluded. No large pleural effusion. No pneumothorax. The cardiac silhouette is within limits. Atherosclerotic calcification of the aorta. Left shoulder arthroplasty. Degenerative changes of the right shoulder. No acute osseous pathology. IMPRESSION:  Congestive heart failure.  Superimposed pneumonia is not excluded. Electronically Signed   By: Anner Crete M.D.   On: 03/02/2022 21:43               LOS: 1 day   Harutyun Monteverde  [facility]   Pager on [email or web address]. If 7PM-7AM, please contact night-coverage at [email or web address]     03/04/2022, 1:27 PM

## 2022-03-04 NOTE — Plan of Care (Signed)

## 2022-03-04 NOTE — Progress Notes (Signed)
PD treatment completed. Tolerated well. PD exit site clean, dry and intact.  Total UF removed 1046 ml  Effluent appearance: clear and yellow

## 2022-03-04 NOTE — Progress Notes (Signed)
Progress Note  Patient Name: James Arnold Date of Encounter: 03/04/2022  [facility] Cardiologist: Nelva Bush, MD   Subjective   Feeling better with less shortness of breath.  Chest pain has resolved.  No edema.  Patient's wife is concerned that he has been receiving atorvastatin during this admission; it was previously stopped due to significant myalgias.  Inpatient Medications    Scheduled Meds:  albuterol  2.5 mg Nebulization Q6H   allopurinol  100 mg Oral Daily   aspirin  81 mg Oral Daily   carvedilol  6.25 mg Oral BID WC   cholecalciferol  1,000 Units Oral Daily   clopidogrel  75 mg Oral Daily   DULoxetine  60 mg Oral Daily   ferrous sulfate  325 mg Oral Q breakfast   furosemide  80 mg Intravenous Daily   gentamicin cream  1 application  Topical Daily   heparin  5,000 Units Subcutaneous Q8H   levETIRAcetam  500 mg Oral QHS   midodrine  5 mg Oral TID WC   nortriptyline  25 mg Oral QHS   pantoprazole  20 mg Oral Daily   sodium chloride flush  3 mL Intravenous Q12H   sodium chloride flush  3 mL Intravenous Q12H   tamsulosin  0.8 mg Oral Daily   Continuous Infusions:  sodium chloride     dialysis solution 2.5% low-MG/low-CA     PRN Meds: sodium chloride, acetaminophen, albuterol, albuterol, morphine injection, nitroGLYCERIN, sodium chloride flush   Vital Signs    Vitals:   03/04/22 0400 03/04/22 0500 03/04/22 0751 03/04/22 0802  BP: 105/76  101/68 102/71  Pulse: (!) 108  (!) 108 (!) 107  Resp: '15  18 16  '$ Temp: 98.3 F (36.8 C)  98.1 F (36.7 C) 98 F (36.7 C)  TempSrc: Oral  Oral Oral  SpO2: (!) 87%  97% 97%  Weight:  68 kg  64 kg  Height:  '5\' 7"'$  (1.702 m)      Intake/Output Summary (Last 24 hours) at 03/04/2022 1051 Last data filed at 03/04/2022 0900 Gross per 24 hour  Intake 15120 ml  Output 16146 ml  Net -1026 ml      03/04/2022    8:02 AM 03/04/2022    5:00 AM 03/03/2022    8:37 PM  Last 3 Weights  Weight (lbs) 141 lb 1.5 oz 149 lb  14.6 oz 156 lb 1.4 oz  Weight (kg) 64 kg 68 kg 70.8 kg      Telemetry    NSR, ST, PVC's, and brief atrial runs lasting up to ~15 seonds - Personally Reviewed  ECG    No new tracing  Physical Exam   GEN: No acute distress.   Neck: JVP ~8 cm with positive HJR. Cardiac: Tachycardic but regular with 1/6 systolic murmur. Respiratory: Coarse breath sounds bilaterally with scattered rhonchi. GI: Soft, nontender, non-distended  MS: No edema; No deformity. Neuro:  Nonfocal  Psych: Normal affect   Labs    High Sensitivity Troponin:   Recent Labs  Lab 03/02/22 2105 03/02/22 2330 03/03/22 0414 03/03/22 2249  TROPONINIHS 308* 320* 285* 231*     Chemistry Recent Labs  Lab 02/25/22 1257 03/02/22 2105 03/03/22 0414 03/04/22 0447  NA 138 139 139 138  K 3.8 3.5 3.4* 3.1*  CL 101 102 104 102  CO2 '24 25 22 24  '$ GLUCOSE 140* 152* 106* 142*  BUN 69* 58* 60* 59*  CREATININE 7.23* 7.29* 7.40* 6.87*  CALCIUM 7.7* 7.7* 7.4*  8.3*  MG  --   --  1.8 1.7  PROT 6.0* 6.4* 5.7*  --   ALBUMIN 2.9* 3.1* 2.8*  --   AST 12* 13* 13*  --   ALT '8 11 11  '$ --   ALKPHOS 44 61 53  --   BILITOT 0.4 0.8 0.8  --   GFRNONAA 8* 8* 8* 8*  ANIONGAP '13 12 13 12    '$ Lipids No results for input(s): "CHOL", "TRIG", "HDL", "LABVLDL", "LDLCALC", "CHOLHDL" in the last 168 hours.  Hematology Recent Labs  Lab 02/25/22 1257 03/02/22 2105 03/03/22 0414  WBC 5.5 7.5 7.9  RBC 2.46* 3.14* 2.92*  HGB 7.1* 8.8* 8.4*  HCT 22.9* 29.2* 27.1*  MCV 93.1 93.0 92.8  MCH 28.9 28.0 28.8  MCHC 31.0 30.1 31.0  RDW 16.1* 16.2* 16.4*  PLT 148* 170 168   Thyroid No results for input(s): "TSH", "FREET4" in the last 168 hours.  BNP Recent Labs  Lab 03/02/22 2105  BNP 1,667.5*    DDimer  Recent Labs  Lab 03/04/22 0447  DDIMER 0.61*     Radiology    DG Chest Port 1 View  Result Date: 03/03/2022 CLINICAL DATA:  Tachypnea EXAM: PORTABLE CHEST 1 VIEW COMPARISON:  03/02/2022 FINDINGS: Diffuse interstitial  prominence throughout the lungs. Heart is borderline in size. No effusions. No acute bony abnormality. IMPRESSION: Diffuse interstitial prominence, likely edema/CHF. Electronically Signed   By: Rolm Baptise M.D.   On: 03/03/2022 21:07   DG Chest Port 1 View  Result Date: 03/02/2022 CLINICAL DATA:  Shortness of breath. EXAM: PORTABLE CHEST 1 VIEW COMPARISON:  Chest radiograph dated 12/22/2021 and CT dated 12/22/2021. FINDINGS: There is vascular congestion. Diffuse interstitial and interlobular septal prominence consistent with edema. Superimposed pneumonia is not excluded. No large pleural effusion. No pneumothorax. The cardiac silhouette is within limits. Atherosclerotic calcification of the aorta. Left shoulder arthroplasty. Degenerative changes of the right shoulder. No acute osseous pathology. IMPRESSION: Congestive heart failure.  Superimposed pneumonia is not excluded. Electronically Signed   By: Anner Crete M.D.   On: 03/02/2022 21:43    Cardiac Studies   TTE (12/25/2021):  1. Left ventricular ejection fraction, by estimation, is 30 to 35%. Left  ventricular ejection fraction by 2D MOD biplane is 32.0 %. The left  ventricle has moderate to severely decreased function. The left ventricle  demonstrates global hypokinesis. The  left ventricular internal cavity size was moderately dilated. Left  ventricular diastolic parameters are consistent with Grade II diastolic  dysfunction (pseudonormalization). The average left ventricular global  longitudinal strain is -11.5 %. The global  longitudinal strain is abnormal.   2. Right ventricular systolic function is low normal. The right  ventricular size is mildly enlarged.   3. Left atrial size was severely dilated.   4. The mitral valve is normal in structure. Moderate mitral valve  regurgitation.   5. Mean aortic valve gradient 64mHg, Peak gradient 3107mg, Vmax 2.50m250m  AVA 1.2cm2. . The aortic valve was not well visualized. Aortic valve   regurgitation is mild. Moderate aortic valve stenosis.   6. The inferior vena cava is normal in size with greater than 50%  respiratory variability, suggesting right atrial pressure of 3 mmHg.   Patient Profile     65 23o. 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  Assessment & Plan    Acute on chronic HFrEF: Volume status appears improved today, though patient is still not back to baseline in regard to his breathing.  He and his wife are concerned about how well his PD system is working at home.  Question if PD regimen needs to be adjusted or if HD (temporary vs permanent) may be a better solution for volume management.  I think Mr. Kitson needs further fluid removal.  Hypotension precludes any further escalation of goal-directed medical therapy at this time. - Continue PD per nephrology. - Continue furosemide 80 mg IV daily. - Continue carvedilol 6.25 mg BID. - Continue midodrine for BP support, though BP remains too low for consideration of ACEI/ARB.  At this time.  ESRD precludes spironolactone and SGLT-2 inhibitor use.  Coronary artery disease and demand ischemia: No further chest pain reported.  I suspec chest pain and mildly elevated troponin on admission were due to supply-demand mismatch in setting of fixed CAD and acute on chronic HFrEF: - Defer cardiac cath unless patient has recurrent angina. - Continue DAPT with aspirin and clopidogrel. - Consider PRBC transfusion for recurrent angina and/or hemoglobin less than 8 (8.4 today). - Given significant myalgias with atorvastatin, we will hold statin for now.  Discussion of alternative statin versus PCSK-9 inhibitor can be pursued as an outpatient.  ESRD: - Per nephrology.  Continue diuresis as  above.  Goals of care: Patient and wife voice concerns about quality of life and potentially benefiting from a hospital bed at home.  He has been seen by palliative care in the symptom management clinic in the past. - Consult palliative to help further explore goals of care and to see if patient would benefit from more support at home to assist with symptom management.    For questions or updates, please contact [place] Please consult [email or web address] for contact info under [facility].     Signed, Nelva Bush, MD  03/04/2022, 10:51 AM

## 2022-03-04 NOTE — Progress Notes (Signed)
   03/03/22 2134  Assess: MEWS Score  Temp 98.6 F (37 C)  BP 103/64  MAP (mmHg) 77  Pulse Rate (!) 113  Resp (!) 32  Level of Consciousness Alert  SpO2 97 %  O2 Device Nasal Cannula  O2 Flow Rate (L/min) 2 L/min  Assess: MEWS Score  MEWS Temp 0  MEWS Systolic 0  MEWS Pulse 2  MEWS RR 2  MEWS LOC 0  MEWS Score 4  MEWS Score Color Red  Assess: if the MEWS score is Yellow or Red  Were vital signs taken at a resting state? Yes  Focused Assessment Change from prior assessment (see assessment flowsheet)  Does the patient meet 2 or more of the SIRS criteria? No  MEWS guidelines implemented *See Row Information* Yes  Treat  MEWS Interventions Consulted Respiratory Therapy;Administered prn meds/treatments  Pain Scale 0-10  Pain Score 6  Pain Type Acute pain  Pain Location Chest  Pain Orientation Mid  Pain Descriptors / Indicators Pressure  Pain Frequency Constant  Pain Onset On-going  Pain Intervention(s) Medication (See eMAR)  Take Vital Signs  Increase Vital Sign Frequency  Red: Q 1hr X 4 then Q 4hr X 4, if remains red, continue Q 4hrs  Escalate  MEWS: Escalate Red: discuss with charge nurse/RN and provider, consider discussing with RRT  Notify: Charge Nurse/RN  Name of Charge Nurse/RN Notified Felicia, RN  Date Charge Nurse/RN Notified 03/03/22  Time Charge Nurse/RN Notified 2145  Notify: Provider  Provider Name/Title Jaci Carrel  Date Provider Notified 03/03/22  Time Provider Notified 2145  Method of Notification Page  Notification Reason Change in status  Provider response At bedside  Date of Provider Response 03/03/22  Time of Provider Response 2150  Notify: Rapid Response  Name of Rapid Response RN Notified Orland Mustard, RN  Date Rapid Response Notified 03/03/22  Time Rapid Response Notified 2145  Document  Patient Outcome Stabilized after interventions  Progress note created (see row info) Yes  Assess: SIRS CRITERIA  SIRS Temperature  0  SIRS Pulse 1   SIRS Respirations  1  SIRS WBC 0  SIRS Score Sum  2

## 2022-03-04 NOTE — Progress Notes (Signed)
[facility]  ROUNDING NOTE   Subjective:   James Arnold is a 66 y.o male with past medical history of anemia, COPD, heart disease, and ESRD. Patient presents to ED with complains of shortness of breath and chest pressure. He has been admitted for Acute pulmonary edema (HCC) [J81.0] SOB (shortness of breath) [R06.02] Elevated troponin [R77.8] Acute on chronic combined systolic and diastolic CHF (congestive heart failure) ([place]) [I50.43]   Patient is known to our clinic and receives peritoneal dialysis follow up at [facility] under Dr Holley Raring.  Patient seen resting comfortably Weaned to room air States he feels much better Lower extremity edema improved  UF 1 L with PD   Objective:  Vital signs in last 24 hours:  Temp:  [97.8 F (36.6 C)-98.7 F (37.1 C)] 97.8 F (36.6 C) (06/13 1200) Pulse Rate:  [104-118] 106 (06/13 1200) Resp:  [15-34] 18 (06/13 1200) BP: (93-112)/(62-76) 94/62 (06/13 1200) SpO2:  [87 %-100 %] 97 % (06/13 1200) Weight:  [64 kg-70.8 kg] 64 kg (06/13 0802)  Weight change: 2.76 kg Filed Weights   03/03/22 2037 03/04/22 0500 03/04/22 0802  Weight: 70.8 kg 68 kg 64 kg    Intake/Output: I/O last 3 completed shifts: In: 480 [P.O.:480] Out: 1100 [Urine:1100]   Intake/Output this shift:  Total I/O In: 09323 [P.O.:240; FTDDU:20254] Out: 27062 [Urine:200; BJSEG:31517]  Physical Exam: General: NAD, resting comfortably  Head: Normocephalic, atraumatic. Moist oral mucosal membranes  Eyes: Anicteric  Lungs:  Faint basilar wheeze, normal effort  Heart: Regular rate and rhythm  Abdomen:  Soft, nontender, PDC  Extremities: Trace peripheral edema.  Neurologic: Nonfocal, moving all four extremities  Skin: No lesions  Access: PD catheter    Basic Metabolic Panel: Recent Labs  Lab 02/25/22 1257 03/02/22 2105 03/03/22 0414 03/04/22 0447  NA 138 139 139 138  K 3.8 3.5 3.4* 3.1*  CL 101 102 104 102  CO2 '24 25 22 24  '$ GLUCOSE 140* 152*  106* 142*  BUN 69* 58* 60* 59*  CREATININE 7.23* 7.29* 7.40* 6.87*  CALCIUM 7.7* 7.7* 7.4* 8.3*  MG  --   --  1.8 1.7     Liver Function Tests: Recent Labs  Lab 02/25/22 1257 03/02/22 2105 03/03/22 0414  AST 12* 13* 13*  ALT '8 11 11  '$ ALKPHOS 44 61 53  BILITOT 0.4 0.8 0.8  PROT 6.0* 6.4* 5.7*  ALBUMIN 2.9* 3.1* 2.8*    No results for input(s): "LIPASE", "AMYLASE" in the last 168 hours. No results for input(s): "AMMONIA" in the last 168 hours.  CBC: Recent Labs  Lab 02/25/22 1257 03/02/22 2105 03/03/22 0414  WBC 5.5 7.5 7.9  NEUTROABS 4.9  --   --   HGB 7.1* 8.8* 8.4*  HCT 22.9* 29.2* 27.1*  MCV 93.1 93.0 92.8  PLT 148* 170 168     Cardiac Enzymes: No results for input(s): "CKTOTAL", "CKMB", "CKMBINDEX", "TROPONINI" in the last 168 hours.  BNP: Invalid input(s): "POCBNP"  CBG: No results for input(s): "GLUCAP" in the last 168 hours.  Microbiology: Results for orders placed or performed in visit on 01/08/22  Microscopic Examination     Status: None   Collection Time: 01/08/22  9:01 AM   Urine  Result Value Ref Range Status   WBC, UA 0-5 0 - 5 /hpf Final   RBC 0-2 0 - 2 /hpf Final   Epithelial Cells (non renal) 0-10 0 - 10 /hpf Final   Bacteria, UA None seen None seen/Few Final  Coagulation Studies: No results for input(s): "LABPROT", "INR" in the last 72 hours.  Urinalysis: No results for input(s): "COLORURINE", "LABSPEC", "PHURINE", "GLUCOSEU", "HGBUR", "BILIRUBINUR", "KETONESUR", "PROTEINUR", "UROBILINOGEN", "NITRITE", "LEUKOCYTESUR" in the last 72 hours.  Invalid input(s): "APPERANCEUR"    Imaging: DG Chest Port 1 View  Result Date: 03/03/2022 CLINICAL DATA:  Tachypnea EXAM: PORTABLE CHEST 1 VIEW COMPARISON:  03/02/2022 FINDINGS: Diffuse interstitial prominence throughout the lungs. Heart is borderline in size. No effusions. No acute bony abnormality. IMPRESSION: Diffuse interstitial prominence, likely edema/CHF. Electronically Signed   By:  Rolm Baptise M.D.   On: 03/03/2022 21:07   DG Chest Port 1 View  Result Date: 03/02/2022 CLINICAL DATA:  Shortness of breath. EXAM: PORTABLE CHEST 1 VIEW COMPARISON:  Chest radiograph dated 12/22/2021 and CT dated 12/22/2021. FINDINGS: There is vascular congestion. Diffuse interstitial and interlobular septal prominence consistent with edema. Superimposed pneumonia is not excluded. No large pleural effusion. No pneumothorax. The cardiac silhouette is within limits. Atherosclerotic calcification of the aorta. Left shoulder arthroplasty. Degenerative changes of the right shoulder. No acute osseous pathology. IMPRESSION: Congestive heart failure.  Superimposed pneumonia is not excluded. Electronically Signed   By: Anner Crete M.D.   On: 03/02/2022 21:43     Medications:    sodium chloride     dialysis solution 2.5% low-MG/low-CA      albuterol  2.5 mg Nebulization Q6H   allopurinol  100 mg Oral Daily   aspirin  81 mg Oral Daily   carvedilol  6.25 mg Oral BID WC   cholecalciferol  1,000 Units Oral Daily   clopidogrel  75 mg Oral Daily   DULoxetine  60 mg Oral Daily   ferrous sulfate  325 mg Oral Q breakfast   furosemide  80 mg Intravenous Daily   gentamicin cream  1 application  Topical Daily   heparin  5,000 Units Subcutaneous Q8H   levETIRAcetam  500 mg Oral QHS   midodrine  5 mg Oral TID WC   nortriptyline  25 mg Oral QHS   pantoprazole  20 mg Oral Daily   sodium chloride flush  3 mL Intravenous Q12H   sodium chloride flush  3 mL Intravenous Q12H   tamsulosin  0.8 mg Oral Daily   sodium chloride, acetaminophen, albuterol, albuterol, morphine injection, nitroGLYCERIN, sodium chloride flush  Assessment/ Plan:  James Arnold is a 66 y.o.  male with past medical history of anemia, COPD, heart disease, and ESRD. Patient presents to ED with complains of shortness of breath and chest pressure. He has been admitted for Acute pulmonary edema (HCC) [J81.0] SOB (shortness of breath)  [R06.02] Elevated troponin [R77.8] Acute on chronic combined systolic and diastolic CHF (congestive heart failure) (HCC) [I50.43]   End-stage renal disease with respiratory distress on peritoneal dialysis.  Will maintain nightly treatments during this admission.    Received peritoneal dialysis overnight with UF 1 L achieved.  Wean to room air.  Patient cleared to discharge from renal stance, will defer to primary team.  2. Anemia of chronic kidney disease Lab Results  Component Value Date   HGB 8.4 (L) 03/03/2022  Receiving oral iron supplementation. Hemoglobin not at target, patient receives [place] outpatient.  3. Secondary Hyperparathyroidism: Lab Results  Component Value Date   CALCIUM 8.3 (L) 03/04/2022   CAION 1.02 (L) 11/19/2020   PHOS 7.8 (H) 11/22/2020    Calcium within acceptable range. Continue cholecalciferol.  4.  Acute on chronic combined systolic and diastolic heart failure.  2D echo from  12/25/2021 shows EF 30 to 35% with a grade 2 diastolic dysfunction and severely dilated left atrium.  Moderate mitral regurg and moderate aortic stenosis also noted on echo.  Continue furosemide 80 mg IV daily.     LOS: 1 Faron Whitelock 6/13/202312:36 PM

## 2022-03-05 ENCOUNTER — Other Ambulatory Visit: Payer: Self-pay | Admitting: Hospice and Palliative Medicine

## 2022-03-05 DIAGNOSIS — D7581 Myelofibrosis: Secondary | ICD-10-CM | POA: Diagnosis not present

## 2022-03-05 DIAGNOSIS — N186 End stage renal disease: Secondary | ICD-10-CM | POA: Diagnosis not present

## 2022-03-05 DIAGNOSIS — I5043 Acute on chronic combined systolic (congestive) and diastolic (congestive) heart failure: Secondary | ICD-10-CM | POA: Diagnosis not present

## 2022-03-05 DIAGNOSIS — Z515 Encounter for palliative care: Secondary | ICD-10-CM

## 2022-03-05 DIAGNOSIS — Z992 Dependence on renal dialysis: Secondary | ICD-10-CM | POA: Diagnosis not present

## 2022-03-05 DIAGNOSIS — R0602 Shortness of breath: Secondary | ICD-10-CM | POA: Diagnosis not present

## 2022-03-05 LAB — BASIC METABOLIC PANEL
Anion gap: 12 (ref 5–15)
BUN: 58 mg/dL — ABNORMAL HIGH (ref 8–23)
CO2: 25 mmol/L (ref 22–32)
Calcium: 8.6 mg/dL — ABNORMAL LOW (ref 8.9–10.3)
Chloride: 99 mmol/L (ref 98–111)
Creatinine, Ser: 6.53 mg/dL — ABNORMAL HIGH (ref 0.61–1.24)
GFR, Estimated: 9 mL/min — ABNORMAL LOW (ref >60)
Glucose, Bld: 127 mg/dL — ABNORMAL HIGH (ref 70–99)
Potassium: 3.3 mmol/L — ABNORMAL LOW (ref 3.5–5.1)
Sodium: 136 mmol/L (ref 135–145)

## 2022-03-05 LAB — HEPATITIS B SURFACE ANTIBODY, QUANTITATIVE: Hep B S AB Quant (Post): 69.4 m[IU]/mL (ref >9.9)

## 2022-03-05 LAB — MAGNESIUM: Magnesium: 1.8 mg/dL (ref 1.7–2.4)

## 2022-03-05 MED ORDER — MIDODRINE HCL 5 MG PO TABS: 5.0000 mg | ORAL_TABLET | Freq: Three times a day (TID) | ORAL | 0 refills | Status: DC

## 2022-03-05 MED ORDER — CARVEDILOL 6.25 MG PO TABS: 6.2500 mg | ORAL_TABLET | Freq: Two times a day (BID) | ORAL | 0 refills | Status: DC

## 2022-03-05 MED ORDER — FUROSEMIDE 40 MG PO TABS: 80.0000 mg | ORAL_TABLET | Freq: Every day | ORAL | Status: DC

## 2022-03-05 NOTE — Progress Notes (Addendum)
[facility]  ROUNDING NOTE   Subjective:   James Arnold is a 66 y.o male with past medical history of anemia, COPD, heart disease, and ESRD. Patient presents to ED with complains of shortness of breath and chest pressure. He has been admitted for Acute pulmonary edema (HCC) [J81.0] SOB (shortness of breath) [R06.02] Elevated troponin [R77.8] Acute on chronic combined systolic and diastolic CHF (congestive heart failure) ([place]) [I50.43]   Patient is known to our clinic and receives peritoneal dialysis follow up at [facility] under Dr Holley Raring.  Patient seen resting in bed Alert oriented States his breathing has improved from admission, weaned to room air Tolerating meals   Objective:  Vital signs in last 24 hours:  Temp:  [97.6 F (36.4 C)-98.4 F (36.9 C)] 97.6 F (36.4 C) (06/14 0906) Pulse Rate:  [93-118] 105 (06/14 0919) Resp:  [14-28] 17 (06/14 0906) BP: (88-107)/(58-75) 100/63 (06/14 0919) SpO2:  [94 %-99 %] 99 % (06/14 0906) Weight:  [66.1 kg-70.1 kg] 66.8 kg (06/14 0907)  Weight change: -6.8 kg Filed Weights   03/04/22 2115 03/05/22 0500 03/05/22 0907  Weight: 66.1 kg 70.1 kg 66.8 kg    Intake/Output: I/O last 3 completed shifts: In: 27062 [P.O.:840; BJSEG:31517] Out: 61607 [Urine:1200; PXTGG:26948]   Intake/Output this shift:  Total I/O In: 54627 [Other:14440] Out: 03500 [XFGHW:29937]  Physical Exam: General: NAD, resting comfortably  Head: Normocephalic, atraumatic. Moist oral mucosal membranes  Eyes: Anicteric  Lungs:  Basilar wheeze, normal effort  Heart: Regular rate and rhythm  Abdomen:  Soft, nontender, PDC  Extremities: Trace peripheral edema.  Neurologic: Nonfocal, moving all four extremities  Skin: No lesions  Access: PD catheter    Basic Metabolic Panel: Recent Labs  Lab 03/02/22 2105 03/03/22 0414 03/04/22 0447 03/05/22 0646  NA 139 139 138 136  K 3.5 3.4* 3.1* 3.3*  CL 102 104 102 99  CO2 '25 22 24 25  '$ GLUCOSE  152* 106* 142* 127*  BUN 58* 60* 59* 58*  CREATININE 7.29* 7.40* 6.87* 6.53*  CALCIUM 7.7* 7.4* 8.3* 8.6*  MG  --  1.8 1.7 1.8     Liver Function Tests: Recent Labs  Lab 03/02/22 2105 03/03/22 0414  AST 13* 13*  ALT 11 11  ALKPHOS 61 53  BILITOT 0.8 0.8  PROT 6.4* 5.7*  ALBUMIN 3.1* 2.8*    No results for input(s): "LIPASE", "AMYLASE" in the last 168 hours. No results for input(s): "AMMONIA" in the last 168 hours.  CBC: Recent Labs  Lab 03/02/22 2105 03/03/22 0414  WBC 7.5 7.9  HGB 8.8* 8.4*  HCT 29.2* 27.1*  MCV 93.0 92.8  PLT 170 168     Cardiac Enzymes: No results for input(s): "CKTOTAL", "CKMB", "CKMBINDEX", "TROPONINI" in the last 168 hours.  BNP: Invalid input(s): "POCBNP"  CBG: No results for input(s): "GLUCAP" in the last 168 hours.  Microbiology: Results for orders placed or performed in visit on 01/08/22  Microscopic Examination     Status: None   Collection Time: 01/08/22  9:01 AM   Urine  Result Value Ref Range Status   WBC, UA 0-5 0 - 5 /hpf Final   RBC 0-2 0 - 2 /hpf Final   Epithelial Cells (non renal) 0-10 0 - 10 /hpf Final   Bacteria, UA None seen None seen/Few Final    Coagulation Studies: No results for input(s): "LABPROT", "INR" in the last 72 hours.  Urinalysis: No results for input(s): "COLORURINE", "LABSPEC", "PHURINE", "GLUCOSEU", "HGBUR", "BILIRUBINUR", "KETONESUR", "PROTEINUR", "  UROBILINOGEN", "NITRITE", "LEUKOCYTESUR" in the last 72 hours.  Invalid input(s): "APPERANCEUR"    Imaging: DG Chest Port 1 View  Result Date: 03/03/2022 CLINICAL DATA:  Tachypnea EXAM: PORTABLE CHEST 1 VIEW COMPARISON:  03/02/2022 FINDINGS: Diffuse interstitial prominence throughout the lungs. Heart is borderline in size. No effusions. No acute bony abnormality. IMPRESSION: Diffuse interstitial prominence, likely edema/CHF. Electronically Signed   By: Rolm Baptise M.D.   On: 03/03/2022 21:07     Medications:    sodium chloride     dialysis  solution 2.5% low-MG/low-CA      allopurinol  100 mg Oral Daily   aspirin  81 mg Oral Daily   carvedilol  6.25 mg Oral BID WC   cholecalciferol  1,000 Units Oral Daily   clopidogrel  75 mg Oral Daily   DULoxetine  60 mg Oral Daily   ferrous sulfate  325 mg Oral Q breakfast   furosemide  80 mg Intravenous Daily   gentamicin cream  1 application  Topical Daily   heparin  5,000 Units Subcutaneous Q8H   levETIRAcetam  500 mg Oral QHS   midodrine  5 mg Oral TID WC   nortriptyline  25 mg Oral QHS   pantoprazole  20 mg Oral Daily   sodium chloride flush  3 mL Intravenous Q12H   sodium chloride flush  3 mL Intravenous Q12H   tamsulosin  0.8 mg Oral Daily   sodium chloride, acetaminophen, albuterol, albuterol, morphine injection, nitroGLYCERIN, sodium chloride flush  Assessment/ Plan:  James Arnold is a 66 y.o.  male with past medical history of anemia, COPD, heart disease, and ESRD. Patient presents to ED with complains of shortness of breath and chest pressure. He has been admitted for Acute pulmonary edema (HCC) [J81.0] SOB (shortness of breath) [R06.02] Elevated troponin [R77.8] Acute on chronic combined systolic and diastolic CHF (congestive heart failure) (HCC) [I50.43]   End-stage renal disease with respiratory distress on peritoneal dialysis.  Will maintain nightly treatments during this admission.    Received peritoneal dialysis overnight with UF 519m achieved.  Patient cleared to discharge from renal stance. Patient reminded to take diuretic daily.   2. Anemia of chronic kidney disease Lab Results  Component Value Date   HGB 8.4 (L) 03/03/2022  Receiving oral iron supplementation. Hemoglobin below desired goal, [place] outpatient.  3. Secondary Hyperparathyroidism: Lab Results  Component Value Date   CALCIUM 8.6 (L) 03/05/2022   CAION 1.02 (L) 11/19/2020   PHOS 7.8 (H) 11/22/2020    Will continue to monitor bone minerals during this admission.  4.  Acute on  chronic combined systolic and diastolic heart failure.  2D echo from 12/25/2021 shows EF 30 to 35% with a grade 2 diastolic dysfunction and severely dilated left atrium.  Moderate mitral regurg and moderate aortic stenosis also noted on echo.  Transitioned to oral Furosemide 80 mg daily.     LOS: 2 Tylar Amborn 6/14/202311:43 AM

## 2022-03-05 NOTE — Progress Notes (Signed)
Community palliative care

## 2022-03-05 NOTE — Progress Notes (Signed)
  Transition of Care [facility]) Screening Note   Patient Details  Name: James Arnold Date of Birth: [birth year]   Transition of Care [facility]) CM/SW Contact:    Alberteen Sam, LCSW Phone Number: 03/05/2022, 3:14 PM  Patient to dc home today, no needs. Referral given to [facility] for them to follow up with outpatient palliative per family request.   Transition of Care Department [facility]) has reviewed patient and no TOC needs have been identified at this time. We will continue to monitor patient advancement through interdisciplinary progression rounds. If new patient transition needs arise, please place a TOC consult.  [place], [place]

## 2022-03-05 NOTE — Progress Notes (Addendum)
[facility] Liaison Note   Notified by [facility] of palliative referral to follow out-patient   [facility] hospital liaison will follow patient for discharge disposition.    Please call with any questions/concerns.    Thank you for the opportunity to participate in this patient's care.   Daphene Calamity, MSW [facility] Liaison  [phone]

## 2022-03-05 NOTE — Progress Notes (Signed)
PROGRESS NOTE    James Arnold  LGX:[id number] DOB: [birth year] DOA: 03/02/2022 PCP: Valerie Roys, DO   Assessment & Plan:   Principal Problem:   SOB (shortness of breath) Active Problems:   CAD (coronary artery disease)   Acute on chronic combined systolic and diastolic CHF (congestive heart failure) (HCC)   ESRD on peritoneal dialysis (HCC)   Anemia of chronic kidney failure, stage 5 (HCC)   Myelofibrosis (HCC)   COPD (chronic obstructive pulmonary disease) ([place])  Assessment and Plan: Acute on chronic combined CHF: continue lasix. Echo on 12/25/2021 showed EF 81-44%, grade 2 diastolic dysfunction, severely dilated left atrium, moderate MR, moderate aortic stenosis. Monitor I/Os  Chest pain: w/ elevated troponins. Trending down. Hx of CAD s/p PCI of mid circumflex in December 2022.  Continue aspirin and Plavix.  Lipitor has been discontinued because of hx of myalgia on Lipitor.   Hypokalemia: will be managed w/ HD   ESRD: continue on PD as per nephro. Nephro recs apprec  Anemia of chronic disease: secondary to ESRD. No need for a transfusion currently   COPD: w/o exacerbation. Continue on bronchodilators  HTN: continue on coreg, lasix   Myelofibrosis: management per heme/onco outpatient     DVT prophylaxis:  heparin  Code Status: full  Family Communication: Called pt's wife, Jackelyn Poling, no answer so I left a voicemail  Disposition Plan: likely d/c home   Level of care: Progressive  Status is: Inpatient Remains inpatient appropriate because: severity of illness, cardio will need to clear pt for d/c    Consultants:  Cardio  Nephro   Procedures:   Antimicrobials:    Subjective: Pt c/o malaise   Objective: Vitals:   03/05/22 0000 03/05/22 0500 03/05/22 0519 03/05/22 0728  BP: 103/69  107/70 (!) 88/58  Pulse: 100 93  97  Resp: (!) '28 14 16 16  '$ Temp:    97.8 F (36.6 C)  TempSrc:    Oral  SpO2: 98% 97%  97%  Weight:  70.1 kg    Height:         Intake/Output Summary (Last 24 hours) at 03/05/2022 0803 Last data filed at 03/05/2022 0500 Gross per 24 hour  Intake 720 ml  Output 850 ml  Net -130 ml   Filed Weights   03/04/22 0802 03/04/22 2115 03/05/22 0500  Weight: 64 kg 66.1 kg 70.1 kg    Examination:  General exam: Appears calm and comfortable  Respiratory system: Clear to auscultation. Respiratory effort normal. Cardiovascular system: S1 & S2 +. No rubs, gallops or clicks.  Gastrointestinal system: Abdomen is nondistended, soft and nontender.Normal bowel sounds heard. Central nervous system: Alert and oriented. Moves all extremities  Psychiatry: Judgement and insight appear normal. Mood & affect appropriate.     Data Reviewed: I have personally reviewed following labs and imaging studies  CBC: Recent Labs  Lab 03/02/22 2105 03/03/22 0414  WBC 7.5 7.9  HGB 8.8* 8.4*  HCT 29.2* 27.1*  MCV 93.0 92.8  PLT 170 818   Basic Metabolic Panel: Recent Labs  Lab 03/02/22 2105 03/03/22 0414 03/04/22 0447 03/05/22 0646  NA 139 139 138 136  K 3.5 3.4* 3.1* 3.3*  CL 102 104 102 99  CO2 '25 22 24 25  '$ GLUCOSE 152* 106* 142* 127*  BUN 58* 60* 59* 58*  CREATININE 7.29* 7.40* 6.87* 6.53*  CALCIUM 7.7* 7.4* 8.3* 8.6*  MG  --  1.8 1.7 1.8   GFR: Estimated Creatinine Clearance: 10.5 mL/min (A) (by C-G  formula based on SCr of 6.53 mg/dL (H)). Liver Function Tests: Recent Labs  Lab 03/02/22 2105 03/03/22 0414  AST 13* 13*  ALT 11 11  ALKPHOS 61 53  BILITOT 0.8 0.8  PROT 6.4* 5.7*  ALBUMIN 3.1* 2.8*   No results for input(s): "LIPASE", "AMYLASE" in the last 168 hours. No results for input(s): "AMMONIA" in the last 168 hours. Coagulation Profile: No results for input(s): "INR", "PROTIME" in the last 168 hours. Cardiac Enzymes: No results for input(s): "CKTOTAL", "CKMB", "CKMBINDEX", "TROPONINI" in the last 168 hours. BNP (last 3 results) No results for input(s): "PROBNP" in the last 8760 hours. HbA1C: No  results for input(s): "HGBA1C" in the last 72 hours. CBG: No results for input(s): "GLUCAP" in the last 168 hours. Lipid Profile: No results for input(s): "CHOL", "HDL", "LDLCALC", "TRIG", "CHOLHDL", "LDLDIRECT" in the last 72 hours. Thyroid Function Tests: No results for input(s): "TSH", "T4TOTAL", "FREET4", "T3FREE", "THYROIDAB" in the last 72 hours. Anemia Panel: No results for input(s): "VITAMINB12", "FOLATE", "FERRITIN", "TIBC", "IRON", "RETICCTPCT" in the last 72 hours. Sepsis Labs: No results for input(s): "PROCALCITON", "LATICACIDVEN" in the last 168 hours.  No results found for this or any previous visit (from the past 240 hour(s)).       Radiology Studies: DG Chest Port 1 View  Result Date: 03/03/2022 CLINICAL DATA:  Tachypnea EXAM: PORTABLE CHEST 1 VIEW COMPARISON:  03/02/2022 FINDINGS: Diffuse interstitial prominence throughout the lungs. Heart is borderline in size. No effusions. No acute bony abnormality. IMPRESSION: Diffuse interstitial prominence, likely edema/CHF. Electronically Signed   By: Rolm Baptise M.D.   On: 03/03/2022 21:07        Scheduled Meds:  allopurinol  100 mg Oral Daily   aspirin  81 mg Oral Daily   carvedilol  6.25 mg Oral BID WC   cholecalciferol  1,000 Units Oral Daily   clopidogrel  75 mg Oral Daily   DULoxetine  60 mg Oral Daily   ferrous sulfate  325 mg Oral Q breakfast   furosemide  80 mg Intravenous Daily   gentamicin cream  1 application  Topical Daily   heparin  5,000 Units Subcutaneous Q8H   levETIRAcetam  500 mg Oral QHS   midodrine  5 mg Oral TID WC   nortriptyline  25 mg Oral QHS   pantoprazole  20 mg Oral Daily   sodium chloride flush  3 mL Intravenous Q12H   sodium chloride flush  3 mL Intravenous Q12H   tamsulosin  0.8 mg Oral Daily   Continuous Infusions:  sodium chloride     dialysis solution 2.5% low-MG/low-CA       LOS: 2 days    Time spent: 33 mins     Wyvonnia Dusky, MD [facility] Pager  [phone]  If 7PM-7AM, please contact night-coverage 03/05/2022, 8:03 AM

## 2022-03-05 NOTE — Progress Notes (Signed)
PIV removed. Discharge instructions completed. Patient verbalized understanding of medication regimen, follow up appointments and discharge instructions. Patient belongings gathered and packed to discharge.  

## 2022-03-05 NOTE — Consult Note (Signed)
[place] at [facility] [phone] [phone]   Name: James Arnold Date: 03/05/2022 MRN: [id number]  DOB: 1956-09-18  Patient Care Team: Valerie Roys, DO as PCP - General (Family Medicine) End, Harrell Gave, MD as PCP - Cardiology (Cardiology) Cammie Sickle, MD as Consulting Physician (Internal Medicine) Dasher, Rayvon Char, MD as Consulting Physician (Dermatology) End, Harrell Gave, MD as Consulting Physician (Cardiology) Anthonette Legato, MD (Nephrology)    REASON FOR CONSULTATION: James Arnold is a 66 y.o. 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  Patient received blood transfusion on 02/27/22 and was hospitalized 03/02/2022 with CHF exacerbation.  Palliative care was consulted to help address goals.  SOCIAL HISTORY:     reports that he quit smoking about 5 months ago. His smoking use included cigarettes. He has a 23.50 pack-year smoking history. He has never used smokeless tobacco. He reports current alcohol use. He reports that he does not use drugs.  Patient is married and lives at home with his wife.  ADVANCE DIRECTIVES:  Not on file  CODE STATUS: Full code  PAST MEDICAL HISTORY: Past Medical History:  Diagnosis Date   Anemia    Benign hypertensive renal disease    CAD (coronary artery disease)    a. 08/1996 s/p BMS to the mLAD ([facility]); b. 12/2017 MV: EF 46%, --> low risk; c. 07/2020 MV: EF 51%, no ischemia/infarct; d. 08/2021 Cath/PCI: LM nl, LAD 60p, 95p/m ISR (3.0x48 Synergy DES), RI small, nl, LCX 40p, 95d(3.5x16 Synergy DES), OM1 50, RCA 40p/170m   Carotid arterial disease ([place]    a. 11/2020 Carotid U/S: <50% bilat ICA stenoses.   CHF (congestive heart failure) (HCC)    Chronic HFrEF (heart failure  with reduced ejection fraction) ([facility]    a. 08/2022 Echo: EF 35-40%.   CKD (chronic kidney disease), stage V ([place]    on peritoneal dialysis   Complication of anesthesia    please call by "[name]" when waking up!!   COPD (chronic obstructive pulmonary disease) (HCC)    centrilobular emphysema. pt is poorly controlled with his copd/smoking.   Depression    Diastolic dysfunction    a. 12/2017 Echo: EF 60-65%, no rwma, Gr1 DD, mild AI/MR. Nl RVSP; b. 08/2020 Echo: EF 60-65%, no rwma, GrI DD, nl RV fxn. RVSP 32.545mg.   Dyspnea on exertion    with exertion   Fatigue    FSGS (focal segmental glomerulosclerosis)    Hyperlipidemia 10/2002   Hypertension    Ischemic cardiomyopathy    a. 08/2022 Echo: EF 35-40%, glob HK, GrII DD, nl RV fxn, mildly dil LA, mild MR, mild AS.   myelofibrosis    bone marrow failure   Myelofibrosis ([facility]   JAK2 (+) primary   [place]ocardial infarction ([place]   stent x 1   Pneumoperitoneum 02/2021   PSVT (paroxysmal supraventricular tachycardia) ([facility]   a. 12/2020 Zio: Sinus rhythm 80 (55-117). Rare PACs/PVCs. 17 episodes of SVT (longest 20.3 secs, fastest 184). No triggered events. No afib.   Smoker    [facility]   a. 11/2020 MRI Brain: patchy acute/early subacute cortical infarcts w/in the R frontal, parietal, and occiptal lobes. Small, chronic L basal ganglia lacunar infarct.   Thrombocytopenia ([facility]   Tobacco abuse 11/07/2020  PAST SURGICAL HISTORY:  Past Surgical History:  Procedure Laterality Date   ANGIOPLASTY     BONE MARROW ASPIRATION  03/2009   CARDIAC CATHETERIZATION  1997   stent x 1   COLONOSCOPY WITH PROPOFOL N/A 08/09/2018   Procedure: COLONOSCOPY WITH PROPOFOL;  Surgeon: [name], MD;  Location: [facility] ENDOSCOPY;  Service: Gastroenterology;  Laterality: N/A;   CORONARY ARTERY BYPASS GRAFT     CORONARY STENT PLACEMENT  08/1996   ESOPHAGOGASTRODUODENOSCOPY (EGD) WITH PROPOFOL N/A 07/25/2021   Procedure:  ESOPHAGOGASTRODUODENOSCOPY (EGD) WITH PROPOFOL;  Surgeon: [name], MD;  Location: [facility] ENDOSCOPY;  Service: Gastroenterology;  Laterality: N/A;   EXTERIORIZATION OF A CONTINUOUS AMBULATORY PERITONEAL DIALYSIS CATHETER     EYE SURGERY Bilateral    cats removed   LEFT HEART CATH AND CORONARY ANGIOGRAPHY N/A 09/12/2021   Procedure: LEFT HEART CATH AND CORONARY ANGIOGRAPHY;  Surgeon: Wellington Hampshire, MD;  Location: [place] CV LAB;  Service: Cardiovascular;  Laterality: N/A;   REVERSE SHOULDER ARTHROPLASTY Left 11/19/2020   Procedure: Left reverse shoulder arthroplasty;  Surgeon: Leim Fabry, MD;  Location: [facility] ORS;  Service: Orthopedics;  Laterality: Left;    HEMATOLOGY/ONCOLOGY HISTORY:  Oncology History Overview Note  # 2010- PRIMARY MYELOFIBROSIS; Jak-2 positive;cytogenetics- Not done [bmbx- 2010] 2010- spleen- 13cm; Dynamic IPS- LOW [0-risk factor]; [place] 2017 AUG spleen-13cm; March 2019-bone marrow biopsy myelofibrosis/no blasts.   # AUGUST 1st week 2019- Retacrit   # October 2nd 2019- jakafi 10 BID [? Renal involvement]; September 2020-peritoneal dialysis; Jakafi 10 mg in the morning; STOPPED/tapered July8th, 2021 [sec intolerance fatigue/myalgias]  # May 4th 2022- START [name] dosing/PD]; STOPPED after2-3 weeks-because of poor tolerance [sever fatigue/? anemia]; NOV 2022-declined Hydrea.  # DEC 2022- NSTEMI- sever 3 vessel CAD- transferred to [facility].  Patient underwent cardiac catheterization/stenting.  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 [name]- kidney Bx- FSG [ on Prednisone Dr.[name]]; July 2020-peritoneal dialysis;   # hx of Lung nodules- resolved [[name]]/quit smoking.  DIAGNOSIS: PRIMARY MYELOFIBROSIS  RISK:LOW     ;GOALS: Control  CURRENT/MOST RECENT THERAPY : Surveillance    Myelofibrosis ([place])    ALLERGIES:  is allergic to losartan.  MEDICATIONS:   Current Facility-Administered Medications  Medication Dose Route Frequency Provider Last Rate Last Admin   0.9 %  sodium chloride infusion  250 mL Intravenous PRN Para Skeans, MD       acetaminophen (TYLENOL) tablet 500 mg  500 mg Oral Q6H PRN Para Skeans, MD       albuterol (PROVENTIL) (2.5 MG/3ML) 0.083% nebulizer solution 2.5 mg  2.5 mg Inhalation Q6H PRN Para Skeans, MD       albuterol (PROVENTIL) (2.5 MG/3ML) 0.083% nebulizer solution 2.5 mg  2.5 mg Nebulization Q6H PRN Para Skeans, MD       allopurinol (ZYLOPRIM) tablet 100 mg  100 mg Oral Daily Florina Ou V, MD   100 mg at 03/05/22 0919   aspirin chewable tablet 81 mg  81 mg Oral Daily Para Skeans, MD   81 mg at 03/05/22 0919   carvedilol (COREG) tablet 6.25 mg  6.25 mg Oral BID WC Colon Flattery, NP   6.25 mg at 03/05/22 0919   cholecalciferol (VITAMIN D3) tablet 1,000 Units  1,000 Units Oral Daily Para Skeans, MD   1,000 Units at 03/05/22 0919   clopidogrel (PLAVIX) tablet 75 mg  75 mg Oral Daily Posey Pronto,  Gretta Cool, MD   75 mg at 03/05/22 0919   dialysis solution 2.5% low-MG/low-CA dianeal solution   Intraperitoneal Q24H Colon Flattery, NP   Other (enter comment in med admin window) at 03/05/22 0922   DULoxetine (CYMBALTA) DR capsule 60 mg  60 mg Oral Daily Para Skeans, MD   60 mg at 03/05/22 0919   ferrous sulfate tablet 325 mg  325 mg Oral Q breakfast Para Skeans, MD   325 mg at 03/05/22 0919   [START ON 03/06/2022] furosemide (LASIX) tablet 80 mg  80 mg Oral Daily Breeze, Benancio Deeds, NP       gentamicin cream (GARAMYCIN) 0.1 % 1 application   1 application  Topical Daily Colon Flattery, NP   1 application  at 37/85/88 0921   heparin injection 5,000 Units  5,000 Units Subcutaneous Q8H Florina Ou V, MD   5,000 Units at 03/05/22 1200   levETIRAcetam (KEPPRA) tablet 500 mg  500 mg Oral QHS Florina Ou V, MD   500 mg at 03/04/22 2144   midodrine (PROAMATINE) tablet 5 mg  5 mg Oral TID WC Colon Flattery, NP   5 mg at  03/05/22 1200   morphine (PF) 2 MG/ML injection 2 mg  2 mg Intravenous Q4H PRN Para Skeans, MD   2 mg at 03/04/22 2144   nitroGLYCERIN (NITROSTAT) SL tablet 0.4 mg  0.4 mg Sublingual Q5 min PRN Para Skeans, MD   0.4 mg at 03/03/22 0541   nortriptyline (PAMELOR) capsule 25 mg  25 mg Oral QHS Para Skeans, MD   25 mg at 03/04/22 2144   pantoprazole (PROTONIX) EC tablet 20 mg  20 mg Oral Daily Florina Ou V, MD   20 mg at 03/05/22 0919   sodium chloride flush (NS) 0.9 % injection 3 mL  3 mL Intravenous Q12H Florina Ou V, MD   3 mL at 03/05/22 0921   sodium chloride flush (NS) 0.9 % injection 3 mL  3 mL Intravenous Q12H Florina Ou V, MD   3 mL at 03/05/22 0921   sodium chloride flush (NS) 0.9 % injection 3 mL  3 mL Intravenous PRN Para Skeans, MD       tamsulosin (FLOMAX) capsule 0.8 mg  0.8 mg Oral Daily Florina Ou V, MD   0.8 mg at 03/05/22 0919    VITAL SIGNS: BP 95/65 (BP Location: Right Arm)   Pulse (!) 101   Temp 97.8 F (36.6 C) (Oral)   Resp 20   Ht _0  (1.702 m)   Wt 147 lb 4.3 oz (66.8 kg)   SpO2 94%   BMI 23.07 kg/m  Filed Weights   03/04/22 2115 03/05/22 0500 03/05/22 0907  Weight: 145 lb 11.6 oz (66.1 kg) 154 lb 8.7 oz (70.1 kg) 147 lb 4.3 oz (66.8 kg)    Estimated body mass index is 23.07 kg/m as calculated from the following:   Height as of this encounter: _1  (1.702 m).   Weight as of this encounter: 147 lb 4.3 oz (66.8 kg).  LABS: CBC:    Component Value Date/Time   WBC 7.9 03/03/2022 0414   HGB 8.4 (L) 03/03/2022 0414   HGB 8.8 (L) 12/31/2021 1049   HCT 27.1 (L) 03/03/2022 0414   HCT 27.2 (L) 12/31/2021 1049   PLT 168 03/03/2022 0414   PLT 155 12/31/2021 1049   MCV 92.8 03/03/2022 0414   MCV 90 12/31/2021 1049   MCV 87 05/12/2014 1038  NEUTROABS 4.9 02/25/2022 1257   NEUTROABS 5.9 12/31/2021 1049   NEUTROABS 4.0 05/12/2014 1038   LYMPHSABS 0.3 (L) 02/25/2022 1257   LYMPHSABS 0.3 (L) 12/31/2021 1049   LYMPHSABS 1.0 05/12/2014 1038    MONOABS 0.2 02/25/2022 1257   MONOABS 0.2 05/12/2014 1038   EOSABS 0.0 02/25/2022 1257   EOSABS 0.0 12/31/2021 1049   EOSABS 0.0 05/12/2014 1038   BASOSABS 0.0 02/25/2022 1257   BASOSABS 0.0 12/31/2021 1049   BASOSABS 0.0 05/12/2014 1038   Comprehensive Metabolic Panel:    Component Value Date/Time   NA 136 03/05/2022 0646   NA 143 01/30/2022 0954   K 3.3 (L) 03/05/2022 0646   CL 99 03/05/2022 0646   CO2 25 03/05/2022 0646   BUN 58 (H) 03/05/2022 0646   BUN 60 (H) 01/30/2022 0954   CREATININE 6.53 (H) 03/05/2022 0646   CREATININE 1.12 09/30/2013 0953   GLUCOSE 127 (H) 03/05/2022 0646   CALCIUM 8.6 (L) 03/05/2022 0646   AST 13 (L) 03/03/2022 0414   ALT 11 03/03/2022 0414   ALKPHOS 53 03/03/2022 0414   BILITOT 0.8 03/03/2022 0414   BILITOT 0.5 11/18/2021 1013   PROT 5.7 (L) 03/03/2022 0414   PROT 5.6 (L) 11/18/2021 1013   ALBUMIN 2.8 (L) 03/03/2022 0414   ALBUMIN 3.5 (L) 11/18/2021 1013    RADIOGRAPHIC STUDIES: DG Chest Port 1 View  Result Date: 03/03/2022 CLINICAL DATA:  Tachypnea EXAM: PORTABLE CHEST 1 VIEW COMPARISON:  03/02/2022 FINDINGS: Diffuse interstitial prominence throughout the lungs. Heart is borderline in size. No effusions. No acute bony abnormality. IMPRESSION: Diffuse interstitial prominence, likely edema/CHF. Electronically Signed   By: Rolm Baptise M.D.   On: 03/03/2022 21:07   DG Chest Port 1 View  Result Date: 03/02/2022 CLINICAL DATA:  Shortness of breath. EXAM: PORTABLE CHEST 1 VIEW COMPARISON:  Chest radiograph dated 12/22/2021 and CT dated 12/22/2021. FINDINGS: There is vascular congestion. Diffuse interstitial and interlobular septal prominence consistent with edema. Superimposed pneumonia is not excluded. No large pleural effusion. No pneumothorax. The cardiac silhouette is within limits. Atherosclerotic calcification of the aorta. Left shoulder arthroplasty. Degenerative changes of the right shoulder. No acute osseous pathology. IMPRESSION: Congestive  heart failure.  Superimposed pneumonia is not excluded. Electronically Signed   By: Anner Crete M.D.   On: 03/02/2022 21:43    PERFORMANCE STATUS (ECOG) : 3 - Symptomatic, >50% confined to bed  Review of Systems Unless otherwise noted, a complete review of systems is negative.  Physical Exam General: NAD Pulmonary: Unlabored Extremities: no edema, no joint deformities Skin: no rashes Neurological: Weakness but otherwise nonfocal  IMPRESSION: Patient seen briefly in the hospital.  He was preparing to discharge home.  Patient appears to be back to baseline.  Briefly discussed the complexity of his medical care in the context of his multiple advanced comorbidities.  His myelofibrosis appears to be clinically worsening and patient is requiring intermittent transfusions.  He was previously on hydroxyurea and Jakafi but did not tolerate treatments.  Patient remains committed to dialysis, which would exclude options such as hospice.  However, wife and patient are interested in community palliative care following him at home.  We will coordinate this referral.  I have requested the patient's wife bring in ACP documents to the clinic so that these can be scanned into his chart.  Patient does not think that he would want to be intubated but is less certain about CPR.  I encouraged him to speak with his family and consider CODE  STATUS.  PLAN: -Continue current scope of treatment -Referral to community palliative care -Patient has scheduled follow-up in the [facility] next week  Case and plan discussed with Dr. Rogue Bussing  Patient expressed understanding and was in agreement with this plan. He also understands that He can call the clinic at any time with any questions, concerns, or complaints.     Time Total: 30 minutes  Visit consisted of counseling and education dealing with the complex and emotionally intense issues of symptom management and palliative care in the setting of  serious and potentially life-threatening illness.Greater than 50%  of this time was spent counseling and coordinating care related to the above assessment and plan.  Signed by: Altha Harm, PhD, NP-C

## 2022-03-05 NOTE — Discharge Summary (Signed)
Physician Discharge Summary  James Arnold XBJ:[id number] DOB: Jun 26, 1956 DOA: 03/02/2022  PCP: Valerie Roys, DO  Admit date: 03/02/2022 Discharge date: 03/05/2022  Admitted From: home  Disposition: home   Recommendations for Outpatient Follow-up:  Follow up with PCP in 1-2 weeks F/u w/ cardio, Dr. Saunders Revel, in 1 week F/u w/ palliative care   Home Health: no  Equipment/Devices:  Discharge Condition: stable  CODE STATUS: full  Diet recommendation: Heart Healthy  Brief/Interim Summary: HPI was taken from Dr. Florina Ou: James Arnold is a 66 y.o. male with medical history significant of heart disease, anemia, myelofibrosis, chronic kidney disease, COPD coming with shortness of breath and chest pressure. Patient has a history of reduced ejection fraction heart failure with a EF of 35 to 40%.  Patient also has a history of COPD, hypertension, history of stroke. Patient is followed by hematology for his myelofibrosis and anemia and gets in EPO and IV therapy. At baseline patient has started to become symptomatic from his anemia and myelofibrosis with generalized fatigue.   In the emergency room his initial blood work shows a hemoglobin of 8.8 glucose 152 creatinine 7.29 BNP of 1667 troponin at 308.  Initial EKG shows sinus tachycardia at 106 with a QT of 492 and no ST T wave abnormalities. Patient given 60 mg of Lasix in the emergency room as he does make urine even though he is on peritoneal dialysis.  Admission requested for shortness of breath and chest pressure evaluation.   As per Dr. Mal Misty: James Arnold is a 66 y.o. male with medical history significant for chronic systolic and diastolic CHF, CAD s/p coronary stents (s/p PCI of mid circumflex in December 2022), ESRD on peritoneal dialysis, COPD, chronic anemia, myelofibrosis, hypertension, history of stroke.  He presented to the hospital because of shortness of breath of about 2 days duration.  He also reported mid sternal chest pain  which has been intermittent.    As per Dr. Jimmye Norman 03/05/22: Pt was cleared by nephro as well as cardio for d/c home. Pt will continue on lasix, aspirin, plavix but pt's coreg dose was reduced to 6.'25mg'$  BID & midodrine was added as well. Palliative care will f/u outpatient w/ pt. Pt and pt's wife are aware.    Discharge Diagnoses:  Principal Problem:   SOB (shortness of breath) Active Problems:   CAD (coronary artery disease)   Acute on chronic combined systolic and diastolic CHF (congestive heart failure) (HCC)   ESRD on peritoneal dialysis (HCC)   Anemia of chronic kidney failure, stage 5 (HCC)   Myelofibrosis (HCC)   COPD (chronic obstructive pulmonary disease) (HCC)   Palliative care encounter  Acute on chronic combined CHF: continue lasix. Echo on 12/25/2021 showed EF 30-86%, grade 2 diastolic dysfunction, severely dilated left atrium, moderate MR, moderate aortic stenosis. Monitor I/Os  Chest pain: w/ elevated troponins. Trending down. Hx of CAD s/p PCI of mid circumflex in December 2022.  Continue aspirin and plavix.  Lipitor has been discontinued because of hx of myalgia on lipitor.    Hypokalemia: will be managed w/ HD   ESRD: continue on PD as per nephro. Nephro recs apprec   Anemia of chronic disease: secondary to ESRD. No need for a transfusion currently    COPD: w/o exacerbation. Continue on bronchodilators   HTN: continue on coreg, lasix    Myelofibrosis: management per heme/onco outpatient   Discharge Instructions  Discharge Instructions     Diet - low sodium heart healthy  Complete by: As directed    Discharge instructions   Complete by: As directed    F/u w/ PCP in 1-2 weeks. F/u w/ cardio, Dr. Saunders Revel, in 1 week. Palliative care will f/u w/ pt outpatient   Discharge wound care:   Complete by: As directed    Clean skin near exit site with chloraprep swab sticks.  Starting at catheter, use circular pattern around exit site, moving towards outer edges of area  covered by dressing.  Apply gentamicin cream to site once daily.  Cover with dry dressing.   Increase activity slowly   Complete by: As directed       Allergies as of 03/05/2022       Reactions   Losartan Shortness Of Breath        Medication List     STOP taking these medications    atorvastatin 40 MG tablet Commonly known as: LIPITOR   polyethylene glycol 17 g packet Commonly known as: MIRALAX / GLYCOLAX   predniSONE 10 MG tablet Commonly known as: DELTASONE   SPS 15 GM/60ML suspension Generic drug: sodium polystyrene       TAKE these medications    acetaminophen 500 MG tablet Commonly known as: TYLENOL Take 500 mg by mouth every 6 (six) hours as needed for headache, fever, moderate pain or mild pain.   albuterol 108 (90 Base) MCG/ACT inhaler Commonly known as: VENTOLIN HFA Inhale 2 puffs into the lungs every 6 (six) hours as needed for wheezing or shortness of breath.   albuterol (2.5 MG/3ML) 0.083% nebulizer solution Commonly known as: PROVENTIL Take 3 mLs (2.5 mg total) by nebulization every 6 (six) hours as needed for wheezing or shortness of breath.   allopurinol 100 MG tablet Commonly known as: ZYLOPRIM Take 100 mg by mouth daily.   aspirin 81 MG chewable tablet Chew 1 tablet (81 mg total) by mouth daily.   carvedilol 6.25 MG tablet Commonly known as: COREG Take 1 tablet (6.25 mg total) by mouth 2 (two) times daily with a meal. What changed:  medication strength how much to take when to take this   clopidogrel 75 MG tablet Commonly known as: PLAVIX Take 75 mg by mouth daily.   DULoxetine 60 MG capsule Commonly known as: Cymbalta Take 1 capsule (60 mg total) by mouth daily.   ferrous sulfate 325 (65 FE) MG tablet Take 1 tablet (325 mg total) by mouth daily with breakfast.   furosemide 80 MG tablet Commonly known as: LASIX Take 1 tablet (80 mg total) by mouth daily. Increased from 20 mg daily.   gentamicin cream 0.1 % Commonly known  as: GARAMYCIN Apply 1 application topically every other day.   levETIRAcetam 500 MG tablet Commonly known as: KEPPRA Take 1 tablet (500 mg total) by mouth at bedtime.   meclizine 12.5 MG tablet Commonly known as: ANTIVERT Take 1 tablet (12.5 mg total) by mouth 2 (two) times daily as needed for dizziness.   midodrine 5 MG tablet Commonly known as: PROAMATINE Take 1 tablet (5 mg total) by mouth 3 (three) times daily with meals.   nitroGLYCERIN 0.4 MG SL tablet Commonly known as: NITROSTAT Place 1 tablet (0.4 mg total) under the tongue every 5 (five) minutes as needed for chest pain.   nortriptyline 25 MG capsule Commonly known as: Pamelor Take 1 capsule (25 mg total) by mouth at bedtime.   pantoprazole 20 MG tablet Commonly known as: PROTONIX Take 20 mg by mouth daily.   tamsulosin 0.4 MG Caps  capsule Commonly known as: FLOMAX Take 2 capsules (0.8 mg total) by mouth daily.   Vitamin D-1000 Max St 25 MCG (1000 UT) tablet Generic drug: Cholecalciferol Take 1,000 Units by mouth daily.               Discharge Care Instructions  (From admission, onward)           Start     Ordered   03/05/22 0000  Discharge wound care:       Comments: Clean skin near exit site with chloraprep swab sticks.  Starting at catheter, use circular pattern around exit site, moving towards outer edges of area covered by dressing.  Apply gentamicin cream to site once daily.  Cover with dry dressing.   03/05/22 1520            Allergies  Allergen Reactions   Losartan Shortness Of Breath    Consultations: Cardio  Nephro    Procedures/Studies: DG Chest Port 1 View  Result Date: 03/03/2022 CLINICAL DATA:  Tachypnea EXAM: PORTABLE CHEST 1 VIEW COMPARISON:  03/02/2022 FINDINGS: Diffuse interstitial prominence throughout the lungs. Heart is borderline in size. No effusions. No acute bony abnormality. IMPRESSION: Diffuse interstitial prominence, likely edema/CHF. Electronically Signed    By: Rolm Baptise M.D.   On: 03/03/2022 21:07   DG Chest Port 1 View  Result Date: 03/02/2022 CLINICAL DATA:  Shortness of breath. EXAM: PORTABLE CHEST 1 VIEW COMPARISON:  Chest radiograph dated 12/22/2021 and CT dated 12/22/2021. FINDINGS: There is vascular congestion. Diffuse interstitial and interlobular septal prominence consistent with edema. Superimposed pneumonia is not excluded. No large pleural effusion. No pneumothorax. The cardiac silhouette is within limits. Atherosclerotic calcification of the aorta. Left shoulder arthroplasty. Degenerative changes of the right shoulder. No acute osseous pathology. IMPRESSION: Congestive heart failure.  Superimposed pneumonia is not excluded. Electronically Signed   By: Anner Crete M.D.   On: 03/02/2022 21:43   (Echo, Carotid, EGD, Colonoscopy, ERCP)    Subjective: Pt c/o fatigue    Discharge Exam: Vitals:   03/05/22 0919 03/05/22 1145  BP: 100/63 95/65  Pulse: (!) 105 (!) 101  Resp:  20  Temp:  97.8 F (36.6 C)  SpO2:  94%   Vitals:   03/05/22 0906 03/05/22 0907 03/05/22 0919 03/05/22 1145  BP:   100/63 95/65  Pulse: (!) 104  (!) 105 (!) 101  Resp: 17   20  Temp: 97.6 F (36.4 C)   97.8 F (36.6 C)  TempSrc: Axillary   Oral  SpO2: 99%   94%  Weight:  66.8 kg    Height:        General: Pt is alert, awake, not in acute distress Cardiovascular: S1/S2 +, no rubs, no gallops Respiratory: CTA bilaterally, no wheezing, no rhonchi Abdominal: Soft, NT, ND, bowel sounds + Extremities: no cyanosis    The results of significant diagnostics from this hospitalization (including imaging, microbiology, ancillary and laboratory) are listed below for reference.     Microbiology: No results found for this or any previous visit (from the past 240 hour(s)).   Labs: BNP (last 3 results) Recent Labs    12/22/21 1152 03/02/22 2105  BNP 1,548.3* 5,102.5*   Basic Metabolic Panel: Recent Labs  Lab 03/02/22 2105 03/03/22 0414  03/04/22 0447 03/05/22 0646  NA 139 139 138 136  K 3.5 3.4* 3.1* 3.3*  CL 102 104 102 99  CO2 '25 22 24 25  '$ GLUCOSE 152* 106* 142* 127*  BUN 58* 60* 59*  58*  CREATININE 7.29* 7.40* 6.87* 6.53*  CALCIUM 7.7* 7.4* 8.3* 8.6*  MG  --  1.8 1.7 1.8   Liver Function Tests: Recent Labs  Lab 03/02/22 2105 03/03/22 0414  AST 13* 13*  ALT 11 11  ALKPHOS 61 53  BILITOT 0.8 0.8  PROT 6.4* 5.7*  ALBUMIN 3.1* 2.8*   No results for input(s): "LIPASE", "AMYLASE" in the last 168 hours. No results for input(s): "AMMONIA" in the last 168 hours. CBC: Recent Labs  Lab 03/02/22 2105 03/03/22 0414  WBC 7.5 7.9  HGB 8.8* 8.4*  HCT 29.2* 27.1*  MCV 93.0 92.8  PLT 170 168   Cardiac Enzymes: No results for input(s): "CKTOTAL", "CKMB", "CKMBINDEX", "TROPONINI" in the last 168 hours. BNP: Invalid input(s): "POCBNP" CBG: No results for input(s): "GLUCAP" in the last 168 hours. D-Dimer Recent Labs    03/04/22 0447  DDIMER 0.61*   Hgb A1c No results for input(s): "HGBA1C" in the last 72 hours. Lipid Profile No results for input(s): "CHOL", "HDL", "LDLCALC", "TRIG", "CHOLHDL", "LDLDIRECT" in the last 72 hours. Thyroid function studies No results for input(s): "TSH", "T4TOTAL", "T3FREE", "THYROIDAB" in the last 72 hours.  Invalid input(s): "FREET3" Anemia work up No results for input(s): "VITAMINB12", "FOLATE", "FERRITIN", "TIBC", "IRON", "RETICCTPCT" in the last 72 hours. Urinalysis    Component Value Date/Time   COLORURINE YELLOW (A) 03/01/2021 0719   APPEARANCEUR Clear 01/08/2022 0901   LABSPEC 1.009 03/01/2021 0719   PHURINE 6.0 03/01/2021 0719   GLUCOSEU Negative 01/08/2022 0901   HGBUR NEGATIVE 03/01/2021 0719   BILIRUBINUR Negative 01/08/2022 0901   KETONESUR NEGATIVE 03/01/2021 0719   PROTEINUR 1+ (A) 01/08/2022 0901   PROTEINUR 100 (A) 03/01/2021 0719   NITRITE Negative 01/08/2022 0901   NITRITE NEGATIVE 03/01/2021 0719   LEUKOCYTESUR Negative 01/08/2022 0901    LEUKOCYTESUR NEGATIVE 03/01/2021 0719   Sepsis Labs Recent Labs  Lab 03/02/22 2105 03/03/22 0414  WBC 7.5 7.9   Microbiology No results found for this or any previous visit (from the past 240 hour(s)).   Time coordinating discharge: Over 30 minutes  SIGNED:   Wyvonnia Dusky, MD  [facility] 03/05/2022, 3:33 PM Pager   If 7PM-7AM, please contact night-coverage

## 2022-03-06 DIAGNOSIS — T82855A Stenosis of coronary artery stent, initial encounter: Secondary | ICD-10-CM | POA: Diagnosis not present

## 2022-03-06 DIAGNOSIS — R0600 Dyspnea, unspecified: Secondary | ICD-10-CM | POA: Diagnosis not present

## 2022-03-06 DIAGNOSIS — D631 Anemia in chronic kidney disease: Secondary | ICD-10-CM | POA: Diagnosis not present

## 2022-03-06 DIAGNOSIS — I214 Non-ST elevation (NSTEMI) myocardial infarction: Secondary | ICD-10-CM | POA: Diagnosis not present

## 2022-03-06 DIAGNOSIS — I4891 Unspecified atrial fibrillation: Secondary | ICD-10-CM | POA: Diagnosis not present

## 2022-03-06 DIAGNOSIS — R011 Cardiac murmur, unspecified: Secondary | ICD-10-CM | POA: Diagnosis not present

## 2022-03-06 DIAGNOSIS — I120 Hypertensive chronic kidney disease with stage 5 chronic kidney disease or end stage renal disease: Secondary | ICD-10-CM | POA: Diagnosis not present

## 2022-03-06 DIAGNOSIS — J90 Pleural effusion, not elsewhere classified: Secondary | ICD-10-CM | POA: Diagnosis not present

## 2022-03-06 DIAGNOSIS — Z955 Presence of coronary angioplasty implant and graft: Secondary | ICD-10-CM | POA: Diagnosis not present

## 2022-03-06 DIAGNOSIS — I959 Hypotension, unspecified: Secondary | ICD-10-CM | POA: Diagnosis not present

## 2022-03-06 DIAGNOSIS — J449 Chronic obstructive pulmonary disease, unspecified: Secondary | ICD-10-CM | POA: Diagnosis not present

## 2022-03-06 DIAGNOSIS — R062 Wheezing: Secondary | ICD-10-CM | POA: Diagnosis not present

## 2022-03-06 DIAGNOSIS — R0789 Other chest pain: Secondary | ICD-10-CM | POA: Diagnosis not present

## 2022-03-06 DIAGNOSIS — I5020 Unspecified systolic (congestive) heart failure: Secondary | ICD-10-CM | POA: Diagnosis not present

## 2022-03-06 DIAGNOSIS — Z794 Long term (current) use of insulin: Secondary | ICD-10-CM | POA: Diagnosis not present

## 2022-03-06 DIAGNOSIS — Z992 Dependence on renal dialysis: Secondary | ICD-10-CM | POA: Diagnosis not present

## 2022-03-06 DIAGNOSIS — I252 Old myocardial infarction: Secondary | ICD-10-CM | POA: Diagnosis not present

## 2022-03-06 DIAGNOSIS — I272 Pulmonary hypertension, unspecified: Secondary | ICD-10-CM | POA: Diagnosis not present

## 2022-03-06 DIAGNOSIS — D7581 Myelofibrosis: Secondary | ICD-10-CM | POA: Diagnosis not present

## 2022-03-06 DIAGNOSIS — N2581 Secondary hyperparathyroidism of renal origin: Secondary | ICD-10-CM | POA: Diagnosis not present

## 2022-03-06 DIAGNOSIS — I132 Hypertensive heart and chronic kidney disease with heart failure and with stage 5 chronic kidney disease, or end stage renal disease: Secondary | ICD-10-CM | POA: Diagnosis not present

## 2022-03-06 DIAGNOSIS — I25119 Atherosclerotic heart disease of native coronary artery with unspecified angina pectoris: Secondary | ICD-10-CM | POA: Diagnosis not present

## 2022-03-06 DIAGNOSIS — F17210 Nicotine dependence, cigarettes, uncomplicated: Secondary | ICD-10-CM | POA: Diagnosis not present

## 2022-03-06 DIAGNOSIS — R079 Chest pain, unspecified: Secondary | ICD-10-CM | POA: Diagnosis not present

## 2022-03-06 DIAGNOSIS — R600 Localized edema: Secondary | ICD-10-CM | POA: Diagnosis not present

## 2022-03-06 DIAGNOSIS — I350 Nonrheumatic aortic (valve) stenosis: Secondary | ICD-10-CM | POA: Diagnosis not present

## 2022-03-06 DIAGNOSIS — I2510 Atherosclerotic heart disease of native coronary artery without angina pectoris: Secondary | ICD-10-CM | POA: Diagnosis not present

## 2022-03-06 DIAGNOSIS — R0902 Hypoxemia: Secondary | ICD-10-CM | POA: Diagnosis not present

## 2022-03-06 DIAGNOSIS — R059 Cough, unspecified: Secondary | ICD-10-CM | POA: Diagnosis not present

## 2022-03-06 DIAGNOSIS — I5022 Chronic systolic (congestive) heart failure: Secondary | ICD-10-CM | POA: Diagnosis not present

## 2022-03-06 DIAGNOSIS — J811 Chronic pulmonary edema: Secondary | ICD-10-CM | POA: Diagnosis not present

## 2022-03-06 DIAGNOSIS — D469 Myelodysplastic syndrome, unspecified: Secondary | ICD-10-CM | POA: Diagnosis not present

## 2022-03-06 DIAGNOSIS — N186 End stage renal disease: Secondary | ICD-10-CM | POA: Diagnosis not present

## 2022-03-06 DIAGNOSIS — I083 Combined rheumatic disorders of mitral, aortic and tricuspid valves: Secondary | ICD-10-CM | POA: Diagnosis not present

## 2022-03-07 ENCOUNTER — Telehealth: Payer: Self-pay | Admitting: Primary Care

## 2022-03-07 NOTE — Telephone Encounter (Signed)
Spoke with patient's wife Jackelyn Poling to offer to schedule Palliative Consult, she stated that patient was admitted to [facility] yesterday, 03/06/22.  I have notified our Hospital Liaison Team to follow patient.  Will call wife back once patient has discharged.

## 2022-03-11 ENCOUNTER — Ambulatory Visit: Payer: Medicare HMO | Admitting: Nurse Practitioner

## 2022-03-12 ENCOUNTER — Inpatient Hospital Stay: Payer: Medicare HMO | Admitting: Medical Oncology

## 2022-03-12 ENCOUNTER — Other Ambulatory Visit: Payer: Self-pay | Admitting: Internal Medicine

## 2022-03-12 ENCOUNTER — Telehealth: Payer: Self-pay | Admitting: Internal Medicine

## 2022-03-12 ENCOUNTER — Encounter: Payer: Self-pay | Admitting: Nurse Practitioner

## 2022-03-12 ENCOUNTER — Inpatient Hospital Stay: Payer: Medicare HMO

## 2022-03-12 NOTE — Telephone Encounter (Signed)
Called to reschedule missed appointments. Patients spouse stated he is admitted to [facility] and she will call to reschedule when he is home.

## 2022-03-13 ENCOUNTER — Inpatient Hospital Stay: Payer: Medicare HMO

## 2022-03-13 ENCOUNTER — Ambulatory Visit: Payer: Medicare HMO

## 2022-03-15 DIAGNOSIS — N186 End stage renal disease: Secondary | ICD-10-CM | POA: Diagnosis not present

## 2022-03-15 DIAGNOSIS — Z992 Dependence on renal dialysis: Secondary | ICD-10-CM | POA: Diagnosis not present

## 2022-03-16 DIAGNOSIS — Z992 Dependence on renal dialysis: Secondary | ICD-10-CM | POA: Diagnosis not present

## 2022-03-16 DIAGNOSIS — N186 End stage renal disease: Secondary | ICD-10-CM | POA: Diagnosis not present

## 2022-03-16 DIAGNOSIS — I214 Non-ST elevation (NSTEMI) myocardial infarction: Secondary | ICD-10-CM | POA: Diagnosis not present

## 2022-03-17 ENCOUNTER — Telehealth: Payer: Self-pay | Admitting: Primary Care

## 2022-03-17 ENCOUNTER — Telehealth: Payer: Self-pay | Admitting: Family Medicine

## 2022-03-17 ENCOUNTER — Encounter: Payer: Self-pay | Admitting: Internal Medicine

## 2022-03-17 DIAGNOSIS — N186 End stage renal disease: Secondary | ICD-10-CM | POA: Diagnosis not present

## 2022-03-17 DIAGNOSIS — Z992 Dependence on renal dialysis: Secondary | ICD-10-CM | POA: Diagnosis not present

## 2022-03-17 NOTE — Telephone Encounter (Signed)
Noted  

## 2022-03-17 NOTE — Telephone Encounter (Signed)
Spoke with patient's wife Eunice Blase regarding the Palliative referral/services and all questions were answered and she was in agreement with scheduling visit.  I have scheduled an In-home Consult for 03/18/22 @ 2 PM

## 2022-03-18 ENCOUNTER — Telehealth: Payer: Self-pay | Admitting: *Deleted

## 2022-03-18 ENCOUNTER — Encounter: Payer: Self-pay | Admitting: Internal Medicine

## 2022-03-18 ENCOUNTER — Other Ambulatory Visit: Payer: Medicaid Other | Admitting: Primary Care

## 2022-03-18 DIAGNOSIS — N186 End stage renal disease: Secondary | ICD-10-CM | POA: Diagnosis not present

## 2022-03-18 DIAGNOSIS — Z992 Dependence on renal dialysis: Secondary | ICD-10-CM | POA: Diagnosis not present

## 2022-03-18 DIAGNOSIS — Z515 Encounter for palliative care: Secondary | ICD-10-CM

## 2022-03-18 DIAGNOSIS — I5022 Chronic systolic (congestive) heart failure: Secondary | ICD-10-CM

## 2022-03-18 DIAGNOSIS — J449 Chronic obstructive pulmonary disease, unspecified: Secondary | ICD-10-CM

## 2022-03-18 DIAGNOSIS — I214 Non-ST elevation (NSTEMI) myocardial infarction: Secondary | ICD-10-CM | POA: Diagnosis not present

## 2022-03-18 DIAGNOSIS — R0602 Shortness of breath: Secondary | ICD-10-CM

## 2022-03-18 NOTE — Telephone Encounter (Signed)
Transition Care Management Unsuccessful Follow-up Telephone Call  Date of discharge and from where:  [facility]  03-15-2022  Attempts:  1st Attempt  Reason for unsuccessful TCM follow-up call:  Left voice message Patient is scheduled for hospital follow up 03-21-2022 @ 11:00  with Laural Benes

## 2022-03-18 NOTE — Progress Notes (Signed)
 Therapist, nutritional Palliative Care Consult Note Telephone: [phone]  Fax: [phone]   Date of encounter: 03/18/22 2:27 PM PATIENT NAME: James Arnold [address] [place] [address]   [phone] (home)  DOB: 03-Jul-1956 MRN: [id number] PRIMARY CARE PROVIDER:    Marshell, Dilauro,  [address] [place] [address] [phone]  REFERRING PROVIDER:       RESPONSIBLE PARTY:    Contact Information     Name Relation Home Work Mobile   Aimee, Timmons [phone]  [phone]        I met face to face with patient and family in  home. Palliative Care was asked to follow this patient by consultation request of  Ronzell, Laban, DO to address advance care planning and complex medical decision making. This is the initial visit.                                     ASSESSMENT AND PLAN / RECOMMENDATIONS:   Advance Care Planning/Goals of Care: Goals include to maximize quality of life and symptom management. Patient/health care surrogate gave his/her permission to discuss.Our advance care planning conversation included a discussion about:    The value and importance of advance care planning  Exploration of personal, cultural or spiritual beliefs that might influence medical decisions  Exploration of goals of care in the event of a sudden injury or illness  Identification of a healthcare agent - wife  Review f an  advance directive document . CODE STATUS: discusses dnr but wants to think more about it. Discussed disease process and current choices for care. Discussed advanced disease and symptoms. Given 5 wishes to review and wants to think about choices.  Symptom Management/Plan:  Debility Endorses fatigue and discouragement at advancing disease. Has had some surgeries offered but not sure that he should have them with multiple comorbidities.  Mobility- Endorses fatigue and need for rollator. Wife states insurance will not pay  for rollator even tho previous device was a hemi walker. Has home health PT to start soon, defer to them for DME.  Endurance: Endorses poor, oxygen at rest is 98%. Has had 3 transfusions, notes they transfuse him at hgb = 7. Not able to drive.   Renal failure; Does PD on his own at the home. Followed by [facility] in [place].   Wt loss, nutrition: has lost 40 lbs in a year. Eating less but endorses a good appetite. Continue to monitor.  Coagulation: On 2 drugs for this, wife is concerned and will discuss with cardiology. Reviewed s/sx of gi bleed. Pt reports bright red rectal blood, small amount.  Follow up Palliative Care Visit: Palliative care will continue to follow for complex medical decision making, advance care planning, and clarification of goals. Return 6 weeks or prn.  I spent 60 minutes providing this consultation. More than 50% of the time in this consultation was spent in counseling and care coordination.  PPS: 50%  HOSPICE ELIGIBILITY/DIAGNOSIS: TBD  Chief Complaint: debility, weakness  HISTORY OF PRESENT ILLNESS:  James Arnold is a 66 y.o. year old male  with ESRD, myelofibrosis, debility,valve disease, wt loss . Patient seen today to review palliative care needs to include medical decision making and advance care planning as appropriate.   History obtained from review of EMR, discussion with primary team, and interview with family, facility staff/caregiver and/or Mr. Holt.  I reviewed available labs, medications, imaging, studies and related documents from the EMR.  Records reviewed and summarized above.   ROS  General: NAD EYES: denies vision changes ENMT: denies dysphagia Cardiovascular: denies chest pain, endorses DOE Pulmonary: denies cough, endorses increased SOB Abdomen: endorses good appetite, denies constipation, endorses continence of bowel GU: denies dysuria, endorses continence of urine MSK:  denies increased weakness,  no falls reported Skin: denies rashes  or wounds Neurological: endorses chronic pain, denies insomnia Psych: Endorses positive mood Heme/lymph/immuno: denies bruises, abnormal bleeding  Physical Exam: Current and past weights: 148 lbs, 185 lbs a year ago. Constitutional: NAD General: frail appearing, thin EYES: anicteric sclera, lids intact, no discharge  ENMT: intact hearing, oral mucous membranes moist, dentition intact CV: S1S2, RRR, murmur 4/6, no LE edema Pulmonary: LCTA, diminished, smoking history,  no increased work of breathing, no cough, room air, PO2=88% Abdomen: intake 80%, normo-active BS + 4 quadrants, soft and non tender, no ascites GU: deferred MSK: + sarcopenia, moves all extremities, ambulatory Skin: warm and dry, no rashes or wounds on visible skin Neuro:  + generalized weakness,  no cognitive impairment Psych: anxious affect, A and O x 3 Hem/lymph/immuno: + widespread bruising  CURRENT PROBLEM LIST:  Patient Active Problem List   Diagnosis Date Noted   Palliative care encounter    SOB (shortness of breath) 03/02/2022   Fall 01/23/2022   Lumbar pain 01/23/2022   QT prolongation 01/23/2022   Nonrheumatic aortic valve stenosis 12/31/2021   Dyspnea 12/23/2021   Acute on chronic combined systolic and diastolic CHF (congestive heart failure) (HCC) 12/22/2021   Chronic HFrEF (heart failure with reduced ejection fraction) (HCC) 10/10/2021   Chronic anemia 10/10/2021   COPD (chronic obstructive pulmonary disease) (HCC) 09/13/2021   History of non-ST elevation myocardial infarction (NSTEMI) 09/11/2021   Aggression 09/06/2021   Migraine without aura and without status migrainosus, not intractable 09/06/2021   History of stroke 03/14/2021   Aortic atherosclerosis (HCC) 12/12/2020   Paralytic syndrome, non-dominant side, post-stroke (HCC) 12/10/2020   Anemia due to vitamin B12 deficiency    Anemia of chronic disease    Urinary retention    History of seizure 11/20/2020   Thrombocytopenia (HCC)  11/20/2020   Leukocytosis 11/20/2020   ESRD on peritoneal dialysis (HCC) 11/20/2020   Status post shoulder replacement    Rotator cuff arthropathy 11/19/2020   Hypertensive heart and chronic kidney disease with heart failure and with stage 5 chronic kidney disease, or end stage renal disease (HCC) 07/10/2020   Dialysis patient (HCC) 01/09/2020   Senile purpura (HCC) 01/09/2020   Centrilobular emphysema (HCC) 09/13/2019   Secondary hyperparathyroidism (HCC) 03/14/2019   BPH (benign prostatic hyperplasia) 10/24/2018   Depression 10/24/2018   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   Anemia of chronic kidney failure, stage 5 (HCC) 12/30/2017   Proteinuria 11/25/2017   Gout 08/27/2017   Myelofibrosis (HCC)    Major depression, recurrent (HCC)    Benign hypertensive renal disease    CKD (chronic kidney disease) stage 5, GFR less than 15 ml/min (HCC)    Hyperlipidemia LDL goal <70 10/23/2002   CAD (coronary artery disease) 08/22/1996   PAST MEDICAL HISTORY:  Active Ambulatory Problems    Diagnosis Date Noted   CAD (coronary artery disease) 08/22/1996   Myelofibrosis (HCC)    Hyperlipidemia LDL goal <70 10/23/2002   Major depression, recurrent (HCC)    Benign hypertensive renal disease    CKD (chronic kidney disease) stage 5, GFR less than 15 ml/min (  HCC)    Gout 08/27/2017   Proteinuria 11/25/2017   Anemia of chronic kidney failure, stage 5 (HCC) 12/30/2017   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   Secondary hyperparathyroidism (HCC) 03/14/2019   Centrilobular emphysema (HCC) 09/13/2019   Dialysis patient (HCC) 01/09/2020   Senile purpura (HCC) 01/09/2020   Hypertensive heart and chronic kidney disease with heart failure and with stage 5 chronic kidney disease, or end stage renal disease (HCC) 07/10/2020   BPH (benign prostatic hyperplasia) 10/24/2018   Depression 10/24/2018   Rotator cuff arthropathy 11/19/2020   History of seizure 11/20/2020    Thrombocytopenia (HCC) 11/20/2020   Leukocytosis 11/20/2020   Status post shoulder replacement    ESRD on peritoneal dialysis (HCC) 11/20/2020   Urinary retention    Anemia of chronic disease    Anemia due to vitamin B12 deficiency    Aortic atherosclerosis (HCC) 12/12/2020   History of stroke 03/14/2021   Paralytic syndrome, non-dominant side, post-stroke (HCC) 12/10/2020   Aggression 09/06/2021   Migraine without aura and without status migrainosus, not intractable 09/06/2021   History of non-ST elevation myocardial infarction (NSTEMI) 09/11/2021   COPD (chronic obstructive pulmonary disease) (HCC) 09/13/2021   Chronic HFrEF (heart failure with reduced ejection fraction) (HCC) 10/10/2021   Chronic anemia 10/10/2021   Acute on chronic combined systolic and diastolic CHF (congestive heart failure) (HCC) 12/22/2021   Dyspnea 12/23/2021   Nonrheumatic aortic valve stenosis 12/31/2021   Fall 01/23/2022   Lumbar pain 01/23/2022   QT prolongation 01/23/2022   SOB (shortness of breath) 03/02/2022   Palliative care encounter    Resolved Ambulatory Problems    Diagnosis Date Noted   Hypertension    Acute renal failure (ARF) (HCC) 05/15/2017   Chest pain 12/29/2017   Tobacco abuse 11/07/2020   ESRD (end stage renal disease) (HCC) 11/20/2020   Hyperkalemia 11/20/2020   Acute CVA (cerebrovascular accident) [facility])    Past Medical History:  Diagnosis Date   Anemia    Carotid arterial disease (HCC)    CHF (congestive heart failure) (HCC)    CKD (chronic kidney disease), stage V (HCC)    Complication of anesthesia    Diastolic dysfunction    Dyspnea on exertion    Fatigue    Hyperlipidemia 10/2002   Ischemic cardiomyopathy    myelofibrosis    Myocardial infarction [facility]) 1997   Pneumoperitoneum 02/2021   PSVT (paroxysmal supraventricular tachycardia) (HCC)    Smoker    Stroke (HCC)    SOCIAL HX:  Social History   Tobacco Use   Smoking status: Former    Packs/day: 0.50     Years: 47.00    Total pack years: 23.50    Types: Cigarettes    Quit date: 09/11/2021    Years since quitting: 0.5   Smokeless tobacco: Never   Tobacco comments:    trying to cut back-smokes 6 to 7 cigs per day  Substance Use Topics   Alcohol use: Yes    Comment: occasionally   FAMILY HX:  Family History  Problem Relation Age of Onset   Stroke Mother        Low BP stroke   Depression Father    Depression Sister        Breast   Heart disease Other    Breast cancer Other    Lung cancer Other    Ovarian cancer Other    Stomach cancer Other       ALLERGIES:  Allergies  Allergen Reactions   Losartan   Shortness Of Breath     PERTINENT MEDICATIONS:  Outpatient Encounter Medications as of 03/18/2022  Medication Sig   acetaminophen  (TYLENOL ) 500 MG tablet Take 500 mg by mouth every 6 (six) hours as needed for headache, fever, moderate pain or mild pain.   albuterol  (PROVENTIL ) (2.5 MG/3ML) 0.083% nebulizer solution Take 3 mLs (2.5 mg total) by nebulization every 6 (six) hours as needed for wheezing or shortness of breath.   allopurinol  (ZYLOPRIM ) 100 MG tablet Take 100 mg by mouth daily.   Cholecalciferol  (VITAMIN D -1000 MAX ST) 25 MCG (1000 UT) tablet Take 1,000 Units by mouth daily.    clopidogrel  (PLAVIX ) 75 MG tablet Take 75 mg by mouth daily.   DULoxetine  (CYMBALTA ) 60 MG capsule Take 1 capsule (60 mg total) by mouth daily.   furosemide  (LASIX ) 80 MG tablet Take 1 tablet (80 mg total) by mouth daily. Increased from 20 mg daily.   gentamicin  cream (GARAMYCIN ) 0.1 % Apply 1 application topically every other day.   levETIRAcetam  (KEPPRA ) 500 MG tablet Take 1 tablet (500 mg total) by mouth at bedtime.   meclizine  (ANTIVERT ) 12.5 MG tablet Take 1 tablet (12.5 mg total) by mouth 2 (two) times daily as needed for dizziness.   nitroGLYCERIN  (NITROSTAT ) 0.4 MG SL tablet Place 1 tablet (0.4 mg total) under the tongue every 5 (five) minutes as needed for chest pain.   nortriptyline   (PAMELOR ) 25 MG capsule Take 1 capsule (25 mg total) by mouth at bedtime.   pantoprazole  (PROTONIX ) 20 MG tablet Take 20 mg by mouth daily.   tamsulosin  (FLOMAX ) 0.4 MG CAPS capsule Take 2 capsules (0.8 mg total) by mouth daily.   albuterol  (VENTOLIN  HFA) 108 (90 Base) MCG/ACT inhaler Inhale 2 puffs into the lungs every 6 (six) hours as needed for wheezing or shortness of breath. (Patient not taking: Reported on 03/18/2022)   amiodarone  (PACERONE ) 200 MG tablet Take 200 mg by mouth daily.   amiodarone  (PACERONE ) 400 MG tablet Take 1 tablet by mouth in the morning, at noon, and at bedtime. X 3 days   aspirin  81 MG chewable tablet Chew 1 tablet (81 mg total) by mouth daily. (Patient not taking: Reported on 03/18/2022)   carvedilol  (COREG ) 6.25 MG tablet Take 1 tablet (6.25 mg total) by mouth 2 (two) times daily with a meal. (Patient not taking: Reported on 03/18/2022)   ferrous sulfate  325 (65 FE) MG tablet Take 1 tablet (325 mg total) by mouth daily with breakfast. (Patient not taking: Reported on 03/18/2022)   midodrine  (PROAMATINE ) 5 MG tablet Take 1 tablet (5 mg total) by mouth 3 (three) times daily with meals. (Patient not taking: Reported on 03/18/2022)   predniSONE  (DELTASONE ) 10 MG tablet Take 1 tablet by mouth daily.   No facility-administered encounter medications on file as of 03/18/2022.   Thank you for the opportunity to participate in the care of Mr. Gordan.  The palliative care team will continue to follow. Please call our office at [phone] if we can be of additional assistance.   Lamarr Welby Sharps, NP , DNP, AGPCNP-BC  COVID-19 PATIENT SCREENING TOOL Asked and negative response unless otherwise noted:  Have you had symptoms of covid, tested positive or been in contact with someone with symptoms/positive test in the past 5-10 days?

## 2022-03-19 ENCOUNTER — Ambulatory Visit: Payer: Medicare HMO

## 2022-03-19 DIAGNOSIS — Z992 Dependence on renal dialysis: Secondary | ICD-10-CM | POA: Diagnosis not present

## 2022-03-19 DIAGNOSIS — N186 End stage renal disease: Secondary | ICD-10-CM | POA: Diagnosis not present

## 2022-03-19 NOTE — Telephone Encounter (Signed)
Transition Care Management Unsuccessful Follow-up Telephone Call  Date of discharge and from where:  [facility] 03-15-2022  Attempts:  2nd Attempt  Reason for unsuccessful TCM follow-up call:  Unable to reach patient Spoke with patients wife  PT was at the home was unable to talk.

## 2022-03-20 DIAGNOSIS — N186 End stage renal disease: Secondary | ICD-10-CM | POA: Diagnosis not present

## 2022-03-20 DIAGNOSIS — Z992 Dependence on renal dialysis: Secondary | ICD-10-CM | POA: Diagnosis not present

## 2022-03-21 ENCOUNTER — Encounter: Payer: Self-pay | Admitting: Family Medicine

## 2022-03-21 ENCOUNTER — Ambulatory Visit (INDEPENDENT_AMBULATORY_CARE_PROVIDER_SITE_OTHER): Payer: Medicare HMO | Admitting: Family Medicine

## 2022-03-21 VITALS — BP 116/73 | HR 96 | Temp 97.4°F | Ht 67.05 in | Wt 147.8 lb

## 2022-03-21 DIAGNOSIS — I132 Hypertensive heart and chronic kidney disease with heart failure and with stage 5 chronic kidney disease, or end stage renal disease: Secondary | ICD-10-CM | POA: Diagnosis not present

## 2022-03-21 DIAGNOSIS — N186 End stage renal disease: Secondary | ICD-10-CM | POA: Diagnosis not present

## 2022-03-21 DIAGNOSIS — I25118 Atherosclerotic heart disease of native coronary artery with other forms of angina pectoris: Secondary | ICD-10-CM

## 2022-03-21 DIAGNOSIS — I214 Non-ST elevation (NSTEMI) myocardial infarction: Secondary | ICD-10-CM

## 2022-03-21 DIAGNOSIS — Z992 Dependence on renal dialysis: Secondary | ICD-10-CM | POA: Diagnosis not present

## 2022-03-21 DIAGNOSIS — Z955 Presence of coronary angioplasty implant and graft: Secondary | ICD-10-CM

## 2022-03-21 DIAGNOSIS — I4891 Unspecified atrial fibrillation: Secondary | ICD-10-CM | POA: Diagnosis not present

## 2022-03-21 DIAGNOSIS — I5020 Unspecified systolic (congestive) heart failure: Secondary | ICD-10-CM | POA: Diagnosis not present

## 2022-03-21 DIAGNOSIS — I350 Nonrheumatic aortic (valve) stenosis: Secondary | ICD-10-CM | POA: Diagnosis not present

## 2022-03-21 DIAGNOSIS — I700 Atherosclerosis of aorta: Secondary | ICD-10-CM | POA: Diagnosis not present

## 2022-03-21 NOTE — Progress Notes (Unsigned)
BP 116/73   Pulse 96   Temp (!) 97.4 F (36.3 C) (Oral)   Ht 5' 7.05" (1.703 m)   Wt 147 lb 12.8 oz (67 kg)   SpO2 96%   BMI 23.12 kg/m    Subjective:    [name], male    DOB: 11/14/1955, 66 y.o.   MRN: [id number]  HPI: James Arnold is a 66 y.o. male  Chief Complaint  Patient presents with   Hospitalization Follow-up    NSTEMI on 03/06/22 discharged on 03/15/22   Watery eyes    Patient believes he may have pink eye, started about a week ago, patient states that it is clearing up   Transition of [facility] Follow up.   Hospital/Facility: [facility] D/C Physician: Dr. Kristin Bruins D/C Date: 03/15/22  Records Requested: 03/21/22 Records Received: 03/21/22 Records Reviewed: 03/21/22  Diagnoses on Discharge:   Date of interactive Contact within 48 hours of discharge: 03/17/22 and 03/18/22 Contact was through: phone  Date of 7 day or 14 day face-to-face visit:  03/21/22  within 7 days  Outpatient Encounter Medications as of 03/21/2022  Medication Sig   acetaminophen (TYLENOL) 500 MG tablet Take 500 mg by mouth every 6 (six) hours as needed for headache, fever, moderate pain or mild pain.   albuterol (PROVENTIL) (2.5 MG/3ML) 0.083% nebulizer solution Take 3 mLs (2.5 mg total) by nebulization every 6 (six) hours as needed for wheezing or shortness of breath.   albuterol (VENTOLIN HFA) 108 (90 Base) MCG/ACT inhaler Inhale 2 puffs into the lungs every 6 (six) hours as needed for wheezing or shortness of breath.   allopurinol (ZYLOPRIM) 100 MG tablet Take 100 mg by mouth daily.   amiodarone (PACERONE) 200 MG tablet Take 200 mg by mouth daily.   amiodarone (PACERONE) 400 MG tablet Take 1 tablet by mouth in the morning, at noon, and at bedtime. X 3 days   Cholecalciferol (VITAMIN D-1000 MAX ST) 25 MCG (1000 UT) tablet Take 1,000 Units by mouth daily.    clopidogrel (PLAVIX) 75 MG tablet Take 75 mg by mouth daily.   DULoxetine (CYMBALTA) 60 MG capsule Take 1 capsule (60 mg  total) by mouth daily.   ferrous sulfate 325 (65 FE) MG tablet Take 1 tablet (325 mg total) by mouth daily with breakfast.   furosemide (LASIX) 80 MG tablet Take 1 tablet (80 mg total) by mouth daily. Increased from 20 mg daily.   gentamicin cream (GARAMYCIN) 0.1 % Apply 1 application topically every other day.   levETIRAcetam (KEPPRA) 500 MG tablet Take 1 tablet (500 mg total) by mouth at bedtime.   meclizine (ANTIVERT) 12.5 MG tablet Take 1 tablet (12.5 mg total) by mouth 2 (two) times daily as needed for dizziness.   midodrine (PROAMATINE) 5 MG tablet Take 1 tablet (5 mg total) by mouth 3 (three) times daily with meals.   nitroGLYCERIN (NITROSTAT) 0.4 MG SL tablet Place 1 tablet (0.4 mg total) under the tongue every 5 (five) minutes as needed for chest pain.   nortriptyline (PAMELOR) 25 MG capsule Take 1 capsule (25 mg total) by mouth at bedtime.   pantoprazole (PROTONIX) 20 MG tablet Take 20 mg by mouth daily.   tamsulosin (FLOMAX) 0.4 MG CAPS capsule Take 2 capsules (0.8 mg total) by mouth daily.   aspirin 81 MG chewable tablet Chew 1 tablet (81 mg total) by mouth daily. (Patient not taking: Reported on 03/18/2022)   carvedilol (COREG) 6.25 MG tablet Take 1  tablet (6.25 mg total) by mouth 2 (two) times daily with a meal. (Patient not taking: Reported on 03/18/2022)   [DISCONTINUED] predniSONE (DELTASONE) 10 MG tablet Take 1 tablet by mouth daily. (Patient not taking: Reported on 03/21/2022)   No facility-administered encounter medications on file as of 03/21/2022.  Per Hospitalist: "Assessment & Plan:  James Arnold is a 66 y.o. male with a PMHx of coronary artery disease status post PCI December, 2022, HFrEF, hypertension, myelofibrosis, COPD, recent hospitalization in June 2023 for volume overload and elevated troponin that presented to [facility] with chest pain and EKG changes concerning for NSTEMI. S/p LHC on 6/15 demonstrating 80-90% in-stent stenosis of pLAD stent in addition to 50% mid/distal  LAD, 50% mid/distal Lcx, 80% RCA diffusely on 03/06/22, LVEDP 28. Transferred back to floor for diuresis and complex PCI vs CABG evaluation. RRT called for hypotension likely 2/2 metoprolol vs adrenal insufficiency. Transferred to CICU for pressor support initially, back to floor, then into RVR back to CICU s/p cardioversion now back to floor.   Principal Problem: NSTEMI (non-ST elevated myocardial infarction) (CMS-HCC) Active Problems: BPH (benign prostatic hyperplasia) CAD (coronary artery disease) Depression ESRD on peritoneal dialysis (CMS-HCC) Gout Hypertension Myelofibrosis (CMS-HCC) Hypomagnesemia  Afib with RVR (controlled) A-fib likely precipitated by overdiuresis and extra fluid removal, unfortunately required Amio load and cardioversion. Now doing much better. Given his lower pressures, discontinuing beta-blocker today and continue amiodarone. We will continue home diuretic and PD regimen inpatient and outpatient. - Discontinue beta-blocker - Continue amiodarone - Eliquis 5 mg twice daily for anticoagulation  HFrEF (20 to 25%) Required transfer to the CICU for hypotension, may have been a result of metoprolol versus adrenal insufficiency. Not able to tolerate much GDMT given lower blood pressures outpatient. Because beta-blocker possibly precipitated cardiogenic shock earlier in this admission, discussed benefits and risks of discontinuing and we ultimately decided to discontinue this. -Continue off all GDMT for now, stop beta-blocker -Hold home Lasix, 80 mg daily. Fluid removal with PD per nephrology  NSTE-ACS Patient presented with symptoms consistent with ACS, troponin elevated to 722. EKG showed sinus tachycardia, TVI I, AVL, V4-V6. TIMI score is 6. S/p 387m aspirin in the ED/home. Underwent left heart cath which showed 80-90% in-stent stenosis of proximal LAD in addition to 50% mid/distal LAD, 50% mid/distal left circumflex and 80% RCA. LVEDP 28. Status post POBA 6/16 of  prior stent. Now doing well, on DAPT and high intensity statin. - Continue aspirin and Plavix - Atorvastatin 80 mg daily  Moderate to Severe Aortic Stenosis His echo this admission shows moderate to severe low-flow, low gradient aortic valve stenosis. Recommend discussing this outpatient with cardiologist.  ESRD on PD: -Continue nightly PD while inpatient  Myelofibrosis: JAK2 positive primary myelofibrosis currently off Jakafi on surveillance. [facility] 02/25/2022. He was unable to tolerate Jakafi well so discontinued March 2023.  -Continue home prednisone 10 mg daily once SDS taper completed   Chronic conditions: BPH: Continue home flomax nightly  COPD: not on inhalers or oxygen at home Chronic back pain: Continue home Cymbalta"  Diagnostic Tests Reviewed: Exam Type:     ECHOCARDIOGRAM W COLORFLOW SPECTRAL DOPPLER W CONTRAST   Study Info  Indications       - assess EF and known aortic stenosis   Complete two-dimensional, color flow and Doppler transthoracic echocardiogram  is performed with contrast to opacify the left ventricle and to improve the  delineation of the left ventricle endocardial borders.  Staff  Referring Physician:     [phone], Berneice Heinrich;  Sonographer:     Cindy Hazy, RDCS  Ordering Physician:     [name]   Account #:     [id number]   Ultrasound Enhancing Agent/Agitated Saline   ------------------------------  UEA/Ag. Saline:     Definity  Amount:     --- ml    Summary    1. The left ventricle is moderately dilated in size with normal wall  thickness.    2. The left ventricular systolic function is severely decreased, LVEF is  visually estimated at 20-25%.    3. There is thinning and akinesis involving the mid anterior, mid antero-  and inferoseptal, apical, apical anterior, apical inferior, apical septal  segments.    4. Mitral annular calcification is present (moderate).    5. There is  severe mitral valve regurgitation.    6. The aortic valve is poorly visualized, but appears thickened with reduced  excursion.    7. There is at least moderate to severe low flow, low gradient aortic valve  stenosis.    8. There is mild aortic regurgitation.    9. The left atrium is severely dilated in size.    10. The right ventricle is mildly dilated in size, with mildly reduced  systolic function.    11. The right atrium is mildly dilated  in size.    Left Ventricle    The left ventricle is moderately dilated in size with normal wall thickness.    The left ventricular systolic function is severely decreased, LVEF is  visually estimated at 20-25%.    There is thinning and akinesis involving the mid anterior, mid antero- and  inferoseptal, apical, apical anterior, apical inferior, apical septal  segments.    Left ventricular diastolic function cannot be accurately assessed.    No evidence of left ventricular thrombus.   Right Ventricle    The right ventricle is mildly dilated in size, with mildly reduced systolic  function.    Left Atrium    The left atrium is severely dilated in size.   Right Atrium    The right atrium is mildly dilated  in size.    Aortic Valve    The aortic valve is poorly visualized, but appears thickened with reduced  excursion.    There is mild aortic regurgitation.    There is at least moderate to severe low flow, low gradient aortic valve  stenosis.    Peak AV transvalvular velocity:  2.8 m/s.    Mean gradient: 18 mmHg.    Doppler velocity index: 0.25.    Estimated aortic valve area (VTI): 0.8 cm2.    LVOT diameter: 1.9 cm.    LV stroke volume index: 25.0 ml/m2.   Pulmonic Valve    Pulmonary valve is not well visualized.    The pulmonic valve is normal.    There is no significant pulmonic regurgitation.    There is no evidence of a significant transvalvular gradient.   Mitral Valve    The mitral valve leaflets are mildly thickened with  normal leaflet mobility.    Mitral annular calcification is present (moderate).    There is severe mitral valve regurgitation.    There is systolic flow reversal in the pulmonary veins.   Tricuspid Valve    The tricuspid valve leaflets are poorly visualized but probably normal, with  normal leaflet mobility.    There is mild tricuspid regurgitation.    There is mild  pulmonary hypertension.    TR maximum velocity: 2.9 m/s Estimated PASP: 48 mmHg.    Pericardium/Pleural    There is no pericardial effusion.   Inferior Vena Cava    IVC size and inspiratory change suggest elevated right atrial pressure.  (10-20 mmHg).   Aorta    The aorta is normal in size in the visualized segments.    FINAL CARDIAC CATHETERIZATION REPORT    Date of Procedure: 03/07/22   ___________________________________________________________________________  _  FINAL CARDIAC CATHETERIZATION REPORT   PATIENT NAME: James Arnold, James Arnold  INDICATION: 66 y.o. male whose presentation is complicated by coronary  artery disease status post PCI December, 2022, HFrEF, hypertension,  myelofibrosis, COPD, ESKD on PD referred to cath lab for PCI to    PROCEDURE DATE: 2022-03-07  ACCESS SITE: right radial artery  PHYSICIANS: Mora Appl (Fellow - Interventional), Posey Boyer  (Attending), Armando Reichert (Fellow - Diagnostic)  REFERRING: Referred Self   Diagnostic procedures: coronary angiography, left heart cath  Interventional procedures: POBA  Contrast Used (ml): 120   CONCLUSIONS:  - Successful POBA to the LAD  -Elevated LVEDP    RECOMMENDATIONS:  - Dual antiplatelet therapy with aspirin and Plavix for at least 12  months, ideally longer  - TR band protocol   DIAGNOSTIC FINDINGS:  Coronary Angiography:  - Dominance: RCA   Coronary Findings:  Left Main:  - Ostial Left Main: No significant stenosis (large caliber)  - Left Main: No significant stenosis (large caliber)   Left Anterior Descending:  -  Ostial LAD: No significant stenosis  - Proximal LAD: severe diffuse disease up to 90% (in-stent)  - Mid LAD: mild irregularity up to 50%  - Distal LAD: mild irregularity up to 50%  - First diagonal: No significant stenosis (small caliber)  - Second diagonal: No significant stenosis (small caliber)  - Third diagonal: No significant stenosis (small caliber)   Left Circumflex:  - Ostial LCx: No significant stenosis (large caliber)  - Proximal LCx: mild irregularity up to 25%  - Mid LCx: moderate irregularity up to 75%  - Distal LCx: mild irregularity up to 50%  - OM1: No significant stenosis (moderate caliber)  - OM2: No significant stenosis (moderate caliber)  - OM3: mild irregularity up to 25% (large caliber)   Right coronary artery:  -  not engaged   Left Ventriculogram:  - Left ventriculogram was not performed.   Hemodynamics:  BP / Ao: 91/54  Mean: 67   Left Heart:  LVEDP: 25  Pre-A wave: 18   Technique: The right radial artery was accessed with an angiocath and a 69F  sheath was placed. 25m of verapamil was administered via the sheath. After  accessing the ascending aorta, heparin was administered. At the end of the  procedure, a TR band was placed to achieve hemostasis.   LESION ASSESSMENT / INTERVENTION:   Procedural antiplatelet regimen: ASA, clopidogrel Procedural anticoagulant  regimen: heparin   Lesion location: LAD  Pre: Stenosis: 90%        Post: Stenosis: 0%         Lesion length: n/a          Pre: TIMI: 2        Post: TIMI: 3  Guidewires Used: Guidewire Asahi Prowater 180cm D- [id number] Guiders Used: Cath Guide Launcher [id number]  Balloons Pre Used: Mini Trek 2 X12 mm, max ATM: 14, [place] Trek Neo 3.50 X 15  mm, max ATM: 18  Balloons Post Used:   Stent(s): n/a (  balloon angioplasty only)   Catheters used:  Complications: none reported  Estimated blood loss: < 30 cc  Radiation: Fluoro time (min): 13.9, Air Kerma Dose (mGy): 455.3, DAP  (Gy-cm2):: 35.3     FINAL CARDIAC CATHETERIZATION REPORT   CONCLUSIONS:  - 80-90% in-stent stenosis of proximal LAD stent  - Diffuse disease elsewhere (50% mid/distal LAD, 60-70% mid/distal Lcx,  80% RCA diffusely - nondominant)  - Elevated LVEDP   RECOMMENDATIONS:  - Heart Team discussion re: complex PCI.  Obtain echocardiogram.  - Other management per primary team  - TR band protocol    PATIENT NAME: James Arnold, James Arnold  INDICATION: 66 year old person with NSTEMI  PROCEDURE DATE: 2022-03-06  ACCESS SITE: right radial artery  PHYSICIANS: Caren Hazy (Attending), Ellsworth Lennox (Fellow - Diagnostic)  REFERRING: Stormy Fabian, MD   Diagnostic procedures: coronary angiography, left heart cath  Contrast Used (ml): 40    DIAGNOSTIC FINDINGS:  Coronary Angiography:  - Dominance: LCA   Coronary Findings:  Left Main:  - Ostial Left Main: No significant stenosis (large caliber)  - Left Main: No significant stenosis (large caliber)   Left Anterior Descending:  - Ostial LAD: No significant stenosis (large caliber)  - Proximal LAD: 80 to 90% stenosis (large caliber, in-stent)  - Mid LAD: 50% stenosis (diffuse)  - Distal LAD: 50% stenosis (diffuse)  - First diagonal: No significant stenosis (small caliber)  - Second diagonal: No significant stenosis (small caliber)  - Third diagonal: No significant stenosis (small caliber)   Left Circumflex:  - Ostial LCx: No significant stenosis (large caliber)  - Proximal LCx: 25% stenosis  - Mid LCx: 60-70% stenosis  - Distal LCx: 50% stenosis  - OM1: No significant stenosis (moderate caliber)  - OM2: No significant stenosis (moderate caliber)  - OM3: 25% stenosis (large caliber, proximal)   Right coronary artery:  - Ostial RCA: No significant stenosis (large caliber)  - Proximal RCA: 80% stenosis  - Mid RCA: 80 to 90% stenosis  - Distal RCA: 80% stenosis  - PDA:  - RCA continuation:    Left Ventriculogram:  - Left ventriculogram was not performed.    Hemodynamics:  BP / Ao: 104/70  Mean: 81   Left Heart:  LVEDP: 25   Technique: The right radial artery was accessed with an angiocath and a 469F  sheath was placed. 60m of verapamil was administered via the sheath. After  accessing the ascending aorta, heparin was administered. At the end of the  procedure, a TR band was placed to achieve hemostasis.   LESION ASSESSMENT / INTERVENTION:  No interventions performed   Catheters used: 469F JL 3.5  Complications: none reported  Estimated blood loss: < 30 cc  Radiation: Fluoro time (min): 4.1, Air Kerma Dose (mGy): 168.6, DAP  (Gy-cm2):: 15.7    EXAM: XR CHEST PORTABLE  DATE: 03/06/2022 9:04 AM  ACCESSION: [id number]  DICTATED: 03/06/2022 9:06 AM  [place]  CLINICAL INDICATION: 66years old Male with CHEST PAIN     TECHNIQUE: Single View AP Chest Radiograph.   COMPARISON: 07/21/2021   FINDINGS:   No support devices.   Moderate interstitial pulmonary edema. Bilateral small pleural effusion. Stable right apical opacity. No pneumothorax.     Unremarkable cardiomediastinal silhouette. Partially visualized left shoulder total arthroplasty.  Disposition: Home with home health  Consults:  Cardiology  Discharge Instructions: follow up here, cardiology and cardiac rehab  Disease/illness Education: discussed today  Home Health/Community Services Discussions/Referrals: in place  Establishment or re-establishment of referral orders for community resources: in place  Discussion with other health care providers: None  Assessment and Support of treatment regimen adherence: Good  Appointments Coordinated with: Patient  Education for self-management, independent living, and ADLs: Discussed today  James Arnold presents today for follow up after his hospitalization. He note that he has been feeling good. He has been really tired. He denies any chest pain or SOB. No swelling. No palpitations. He is otherwise  feeling well with no other concerns or complaints at this time.   Relevant past medical, surgical, family and social history reviewed and updated as indicated. Interim medical history since our last visit reviewed. Allergies and medications reviewed and updated.  Review of Systems  Constitutional:  Positive for fatigue. Negative for activity change, appetite change, chills, diaphoresis, fever and unexpected weight change.  HENT: Negative.    Eyes: Negative.   Respiratory: Negative.    Cardiovascular: Negative.   Gastrointestinal: Negative.   Neurological: Negative.   Psychiatric/Behavioral: Negative.      Per HPI unless specifically indicated above     Objective:    BP 116/73   Pulse 96   Temp (!) 97.4 F (36.3 C) (Oral)   Ht 5' 7.05" (1.703 m)   Wt 147 lb 12.8 oz (67 kg)   SpO2 96%   BMI 23.12 kg/m   Wt Readings from Last 3 Encounters:  03/21/22 147 lb 12.8 oz (67 kg)  03/05/22 147 lb 4.3 oz (66.8 kg)  02/07/22 152 lb (68.9 kg)    Physical Exam Vitals and nursing note reviewed.  Constitutional:      General: He is not in acute distress.    Appearance: Normal appearance. He is normal weight. He is not ill-appearing, toxic-appearing or diaphoretic.  HENT:     Head: Normocephalic and atraumatic.     Right Ear: External ear normal.     Left Ear: External ear normal.     Nose: Nose normal.     Mouth/Throat:     Mouth: Mucous membranes are moist.     Pharynx: Oropharynx is clear.  Eyes:     General: No scleral icterus.       Right eye: No discharge.        Left eye: No discharge.     Extraocular Movements: Extraocular movements intact.     Conjunctiva/sclera: Conjunctivae normal.     Pupils: Pupils are equal, round, and reactive to light.  Cardiovascular:     Rate and Rhythm: Normal rate and regular rhythm.     Pulses: Normal pulses.     Heart sounds: Normal heart sounds. No murmur heard.    No friction rub. No gallop.  Pulmonary:     Effort: Pulmonary effort  is normal. No respiratory distress.     Breath sounds: Normal breath sounds. No stridor. No wheezing, rhonchi or rales.  Chest:     Chest wall: No tenderness.  Musculoskeletal:        General: Normal range of motion.     Cervical back: Normal range of motion and neck supple.  Skin:    General: Skin is warm and dry.     Capillary Refill: Capillary refill takes less than 2 seconds.     Coloration: Skin is not jaundiced or pale.     Findings: No bruising, erythema, lesion or rash.  Neurological:     General: No focal deficit present.     Mental Status: He is alert and oriented to person, place,  and time. Mental status is at baseline.  Psychiatric:        Mood and Affect: Mood normal.        Behavior: Behavior normal.        Thought Content: Thought content normal.        Judgment: Judgment normal.     Results for orders placed or performed in visit on 03/21/22  CBC with Differential/Platelet  Result Value Ref Range   WBC 8.6 3.4 - 10.8 x10E3/uL   RBC 3.07 (L) 4.14 - 5.80 x10E6/uL   Hemoglobin 8.8 (L) 13.0 - 17.7 g/dL   Hematocrit 27.2 (L) 37.5 - 51.0 %   MCV 89 79 - 97 fL   MCH 28.7 26.6 - 33.0 pg   MCHC 32.4 31.5 - 35.7 g/dL   RDW 15.7 (H) 11.6 - 15.4 %   Platelets 188 150 - 450 x10E3/uL   Neutrophils 92 Not Estab. %   Lymphs 3 Not Estab. %   Monocytes 3 Not Estab. %   Eos 0 Not Estab. %   Basos 0 Not Estab. %   Neutrophils Absolute 7.9 (H) 1.4 - 7.0 x10E3/uL   Lymphocytes Absolute 0.3 (L) 0.7 - 3.1 x10E3/uL   Monocytes Absolute 0.2 0.1 - 0.9 x10E3/uL   EOS (ABSOLUTE) 0.0 0.0 - 0.4 x10E3/uL   Basophils Absolute 0.0 0.0 - 0.2 x10E3/uL   Immature Granulocytes 2 Not Estab. %   Immature Grans (Abs) 0.1 0.0 - 0.1 J62G3/TD  Basic metabolic panel  Result Value Ref Range   Glucose 146 (H) 70 - 99 mg/dL   BUN 44 (H) 8 - 27 mg/dL   Creatinine, Ser 7.08 (H) 0.76 - 1.27 mg/dL   eGFR 8 (L) >59 mL/min/1.73   BUN/Creatinine Ratio 6 (L) 10 - 24   Sodium 137 134 - 144 mmol/L    Potassium 4.1 3.5 - 5.2 mmol/L   Chloride 96 96 - 106 mmol/L   CO2 22 20 - 29 mmol/L   Calcium 8.1 (L) 8.6 - 10.2 mg/dL      Assessment & Plan:   Problem List Items Addressed This Visit       Cardiovascular and Mediastinum   CAD (coronary artery disease)    Will keep BP and cholesterol under good control. Continue to follow with cardiology. Call with any concerns.       Relevant Orders   CBC with Differential/Platelet (Completed)   Basic metabolic panel (Completed)   Hypertensive heart and chronic kidney disease with heart failure and with stage 5 chronic kidney disease, or end stage renal disease (HCC)    Will keep BP and cholesterol under good control. Continue to follow with cardiology. Call with any concerns.       Aortic atherosclerosis (HCC)    Will keep BP and cholesterol under good control. Continue to follow with cardiology. Call with any concerns.       Nonrheumatic aortic valve stenosis    Will keep BP and cholesterol under good control. Continue to follow with cardiology. Call with any concerns.       HFrEF (heart failure with reduced ejection fraction) ([place])    Euvolemic today. Will keep BP and cholesterol under good control. Continue to follow with cardiology. Call with any concerns.       NSTEMI (non-ST elevated myocardial infarction) ([place]) - Primary    Feeling well today. No pain. Will keep BP and cholesterol under good control. Continue to follow with cardiology. Call with any concerns.  Atrial fibrillation with RVR (HCC)    NSR today. Continue to monitor. Continue to follow with cardiology. Call with any concerns.         Genitourinary   ESRD (end stage renal disease) ([place])    Continue diaylisis. Continue to follow with nephrology. Call with any concerns.         Other   S/P drug eluting coronary stent placement    Will keep BP and cholesterol under good control. Continue to follow with cardiology. Call with any concerns.         Follow  up plan: Return as scheduled.   >30 minutes spent with patient today

## 2022-03-22 DIAGNOSIS — Z992 Dependence on renal dialysis: Secondary | ICD-10-CM | POA: Diagnosis not present

## 2022-03-22 DIAGNOSIS — N186 End stage renal disease: Secondary | ICD-10-CM | POA: Diagnosis not present

## 2022-03-22 LAB — BASIC METABOLIC PANEL
BUN/Creatinine Ratio: 6 — ABNORMAL LOW (ref 10–24)
BUN: 44 mg/dL — ABNORMAL HIGH (ref 8–27)
CO2: 22 mmol/L (ref 20–29)
Calcium: 8.1 mg/dL — ABNORMAL LOW (ref 8.6–10.2)
Chloride: 96 mmol/L (ref 96–106)
Creatinine, Ser: 7.08 mg/dL — ABNORMAL HIGH (ref 0.76–1.27)
Glucose: 146 mg/dL — ABNORMAL HIGH (ref 70–99)
Potassium: 4.1 mmol/L (ref 3.5–5.2)
Sodium: 137 mmol/L (ref 134–144)
eGFR: 8 mL/min/{1.73_m2} — ABNORMAL LOW (ref >59)

## 2022-03-22 LAB — CBC WITH DIFFERENTIAL/PLATELET
Basophils Absolute: 0 10*3/uL (ref 0.0–0.2)
Basos: 0 %
EOS (ABSOLUTE): 0 10*3/uL (ref 0.0–0.4)
Eos: 0 %
Hematocrit: 27.2 % — ABNORMAL LOW (ref 37.5–51.0)
Hemoglobin: 8.8 g/dL — ABNORMAL LOW (ref 13.0–17.7)
Immature Grans (Abs): 0.1 10*3/uL (ref 0.0–0.1)
Immature Granulocytes: 2 %
Lymphocytes Absolute: 0.3 10*3/uL — ABNORMAL LOW (ref 0.7–3.1)
Lymphs: 3 %
MCH: 28.7 pg (ref 26.6–33.0)
MCHC: 32.4 g/dL (ref 31.5–35.7)
MCV: 89 fL (ref 79–97)
Monocytes Absolute: 0.2 10*3/uL (ref 0.1–0.9)
Monocytes: 3 %
Neutrophils Absolute: 7.9 10*3/uL — ABNORMAL HIGH (ref 1.4–7.0)
Neutrophils: 92 %
Platelets: 188 10*3/uL (ref 150–450)
RBC: 3.07 x10E6/uL — ABNORMAL LOW (ref 4.14–5.80)
RDW: 15.7 % — ABNORMAL HIGH (ref 11.6–15.4)
WBC: 8.6 10*3/uL (ref 3.4–10.8)

## 2022-03-23 DIAGNOSIS — Z992 Dependence on renal dialysis: Secondary | ICD-10-CM | POA: Diagnosis not present

## 2022-03-23 DIAGNOSIS — N186 End stage renal disease: Secondary | ICD-10-CM | POA: Diagnosis not present

## 2022-03-24 ENCOUNTER — Ambulatory Visit: Payer: Medicare HMO | Admitting: Family

## 2022-03-24 ENCOUNTER — Encounter: Payer: Self-pay | Admitting: Family Medicine

## 2022-03-24 DIAGNOSIS — Z955 Presence of coronary angioplasty implant and graft: Secondary | ICD-10-CM | POA: Insufficient documentation

## 2022-03-24 DIAGNOSIS — I214 Non-ST elevation (NSTEMI) myocardial infarction: Secondary | ICD-10-CM | POA: Insufficient documentation

## 2022-03-24 DIAGNOSIS — I5020 Unspecified systolic (congestive) heart failure: Secondary | ICD-10-CM | POA: Insufficient documentation

## 2022-03-24 DIAGNOSIS — Z992 Dependence on renal dialysis: Secondary | ICD-10-CM | POA: Diagnosis not present

## 2022-03-24 DIAGNOSIS — N186 End stage renal disease: Secondary | ICD-10-CM | POA: Diagnosis not present

## 2022-03-24 DIAGNOSIS — I4891 Unspecified atrial fibrillation: Secondary | ICD-10-CM | POA: Insufficient documentation

## 2022-03-24 NOTE — Assessment & Plan Note (Signed)
Will keep BP and cholesterol under good control. Continue to follow with cardiology. Call with any concerns.  

## 2022-03-24 NOTE — Assessment & Plan Note (Signed)
NSR today. Continue to monitor. Continue to follow with cardiology. Call with any concerns.

## 2022-03-24 NOTE — Assessment & Plan Note (Signed)
Feeling well today. No pain. Will keep BP and cholesterol under good control. Continue to follow with cardiology. Call with any concerns.

## 2022-03-24 NOTE — Assessment & Plan Note (Signed)
Continue diaylisis. Continue to follow with nephrology. Call with any concerns.

## 2022-03-24 NOTE — Assessment & Plan Note (Signed)
Euvolemic today. Will keep BP and cholesterol under good control. Continue to follow with cardiology. Call with any concerns.

## 2022-03-25 ENCOUNTER — Emergency Department: Payer: Medicare HMO

## 2022-03-25 ENCOUNTER — Other Ambulatory Visit: Payer: Self-pay

## 2022-03-25 ENCOUNTER — Inpatient Hospital Stay
Admission: EM | Admit: 2022-03-25 | Discharge: 2022-03-28 | DRG: 291 | Disposition: A | Discharge status: Home / Self Care | Payer: Medicare HMO | Attending: Internal Medicine | Admitting: Internal Medicine

## 2022-03-25 DIAGNOSIS — R079 Chest pain, unspecified: Secondary | ICD-10-CM | POA: Diagnosis present

## 2022-03-25 DIAGNOSIS — I5022 Chronic systolic (congestive) heart failure: Secondary | ICD-10-CM | POA: Diagnosis not present

## 2022-03-25 DIAGNOSIS — I5042 Chronic combined systolic (congestive) and diastolic (congestive) heart failure: Secondary | ICD-10-CM | POA: Diagnosis not present

## 2022-03-25 DIAGNOSIS — G40909 Epilepsy, unspecified, not intractable, without status epilepticus: Secondary | ICD-10-CM | POA: Diagnosis present

## 2022-03-25 DIAGNOSIS — D631 Anemia in chronic kidney disease: Secondary | ICD-10-CM | POA: Diagnosis not present

## 2022-03-25 DIAGNOSIS — D696 Thrombocytopenia, unspecified: Secondary | ICD-10-CM | POA: Diagnosis present

## 2022-03-25 DIAGNOSIS — N186 End stage renal disease: Secondary | ICD-10-CM | POA: Diagnosis not present

## 2022-03-25 DIAGNOSIS — I959 Hypotension, unspecified: Secondary | ICD-10-CM | POA: Diagnosis not present

## 2022-03-25 DIAGNOSIS — I5043 Acute on chronic combined systolic (congestive) and diastolic (congestive) heart failure: Secondary | ICD-10-CM | POA: Diagnosis not present

## 2022-03-25 DIAGNOSIS — R0789 Other chest pain: Secondary | ICD-10-CM | POA: Diagnosis not present

## 2022-03-25 DIAGNOSIS — I480 Paroxysmal atrial fibrillation: Secondary | ICD-10-CM | POA: Diagnosis not present

## 2022-03-25 DIAGNOSIS — Z8673 Personal history of transient ischemic attack (TIA), and cerebral infarction without residual deficits: Secondary | ICD-10-CM | POA: Diagnosis not present

## 2022-03-25 DIAGNOSIS — I5A Non-ischemic myocardial injury (non-traumatic): Secondary | ICD-10-CM | POA: Diagnosis present

## 2022-03-25 DIAGNOSIS — I5023 Acute on chronic systolic (congestive) heart failure: Secondary | ICD-10-CM | POA: Diagnosis not present

## 2022-03-25 DIAGNOSIS — R0600 Dyspnea, unspecified: Secondary | ICD-10-CM | POA: Diagnosis not present

## 2022-03-25 DIAGNOSIS — I4891 Unspecified atrial fibrillation: Secondary | ICD-10-CM | POA: Diagnosis present

## 2022-03-25 DIAGNOSIS — D7581 Myelofibrosis: Secondary | ICD-10-CM | POA: Diagnosis present

## 2022-03-25 DIAGNOSIS — I10 Essential (primary) hypertension: Secondary | ICD-10-CM | POA: Diagnosis not present

## 2022-03-25 DIAGNOSIS — E785 Hyperlipidemia, unspecified: Secondary | ICD-10-CM | POA: Diagnosis present

## 2022-03-25 DIAGNOSIS — Z79899 Other long term (current) drug therapy: Secondary | ICD-10-CM

## 2022-03-25 DIAGNOSIS — I2510 Atherosclerotic heart disease of native coronary artery without angina pectoris: Secondary | ICD-10-CM | POA: Diagnosis not present

## 2022-03-25 DIAGNOSIS — I255 Ischemic cardiomyopathy: Secondary | ICD-10-CM | POA: Diagnosis present

## 2022-03-25 DIAGNOSIS — R0689 Other abnormalities of breathing: Secondary | ICD-10-CM | POA: Diagnosis not present

## 2022-03-25 DIAGNOSIS — Z7902 Long term (current) use of antithrombotics/antiplatelets: Secondary | ICD-10-CM | POA: Diagnosis not present

## 2022-03-25 DIAGNOSIS — Z818 Family history of other mental and behavioral disorders: Secondary | ICD-10-CM

## 2022-03-25 DIAGNOSIS — I350 Nonrheumatic aortic (valve) stenosis: Secondary | ICD-10-CM | POA: Diagnosis not present

## 2022-03-25 DIAGNOSIS — Z87898 Personal history of other specified conditions: Secondary | ICD-10-CM | POA: Diagnosis not present

## 2022-03-25 DIAGNOSIS — R000 Tachycardia, unspecified: Secondary | ICD-10-CM | POA: Diagnosis not present

## 2022-03-25 DIAGNOSIS — Z955 Presence of coronary angioplasty implant and graft: Secondary | ICD-10-CM | POA: Diagnosis not present

## 2022-03-25 DIAGNOSIS — I509 Heart failure, unspecified: Secondary | ICD-10-CM | POA: Diagnosis not present

## 2022-03-25 DIAGNOSIS — I050 Rheumatic mitral stenosis: Secondary | ICD-10-CM | POA: Diagnosis not present

## 2022-03-25 DIAGNOSIS — Z87891 Personal history of nicotine dependence: Secondary | ICD-10-CM | POA: Diagnosis not present

## 2022-03-25 DIAGNOSIS — F32A Depression, unspecified: Secondary | ICD-10-CM | POA: Diagnosis present

## 2022-03-25 DIAGNOSIS — Z7901 Long term (current) use of anticoagulants: Secondary | ICD-10-CM

## 2022-03-25 DIAGNOSIS — J449 Chronic obstructive pulmonary disease, unspecified: Secondary | ICD-10-CM | POA: Diagnosis not present

## 2022-03-25 DIAGNOSIS — J432 Centrilobular emphysema: Secondary | ICD-10-CM | POA: Diagnosis not present

## 2022-03-25 DIAGNOSIS — N185 Chronic kidney disease, stage 5: Secondary | ICD-10-CM

## 2022-03-25 DIAGNOSIS — E8770 Fluid overload, unspecified: Secondary | ICD-10-CM | POA: Diagnosis not present

## 2022-03-25 DIAGNOSIS — I252 Old myocardial infarction: Secondary | ICD-10-CM | POA: Diagnosis not present

## 2022-03-25 DIAGNOSIS — Z992 Dependence on renal dialysis: Secondary | ICD-10-CM | POA: Diagnosis not present

## 2022-03-25 DIAGNOSIS — I132 Hypertensive heart and chronic kidney disease with heart failure and with stage 5 chronic kidney disease, or end stage renal disease: Secondary | ICD-10-CM | POA: Diagnosis not present

## 2022-03-25 DIAGNOSIS — I130 Hypertensive heart and chronic kidney disease with heart failure and stage 1 through stage 4 chronic kidney disease, or unspecified chronic kidney disease: Secondary | ICD-10-CM | POA: Diagnosis not present

## 2022-03-25 DIAGNOSIS — K219 Gastro-esophageal reflux disease without esophagitis: Secondary | ICD-10-CM | POA: Diagnosis present

## 2022-03-25 DIAGNOSIS — N400 Enlarged prostate without lower urinary tract symptoms: Secondary | ICD-10-CM | POA: Diagnosis present

## 2022-03-25 DIAGNOSIS — Z888 Allergy status to other drugs, medicaments and biological substances status: Secondary | ICD-10-CM

## 2022-03-25 LAB — CBC WITH DIFFERENTIAL/PLATELET
Abs Immature Granulocytes: 0.05 10*3/uL (ref 0.00–0.07)
Basophils Absolute: 0 10*3/uL (ref 0.0–0.1)
Basophils Relative: 0 %
Eosinophils Absolute: 0 10*3/uL (ref 0.0–0.5)
Eosinophils Relative: 0 %
HCT: 28.1 % — ABNORMAL LOW (ref 39.0–52.0)
Hemoglobin: 8.4 g/dL — ABNORMAL LOW (ref 13.0–17.0)
Immature Granulocytes: 1 %
Lymphocytes Relative: 4 %
Lymphs Abs: 0.3 10*3/uL — ABNORMAL LOW (ref 0.7–4.0)
MCH: 27.6 pg (ref 26.0–34.0)
MCHC: 29.9 g/dL — ABNORMAL LOW (ref 30.0–36.0)
MCV: 92.4 fL (ref 80.0–100.0)
Monocytes Absolute: 0.3 10*3/uL (ref 0.1–1.0)
Monocytes Relative: 4 %
Neutro Abs: 6.8 10*3/uL (ref 1.7–7.7)
Neutrophils Relative %: 91 %
Platelets: 126 10*3/uL — ABNORMAL LOW (ref 150–400)
RBC: 3.04 MIL/uL — ABNORMAL LOW (ref 4.22–5.81)
RDW: 17.5 % — ABNORMAL HIGH (ref 11.5–15.5)
WBC: 7.5 10*3/uL (ref 4.0–10.5)
nRBC: 0 % (ref 0.0–0.2)

## 2022-03-25 LAB — PROTIME-INR
INR: 2.1 — ABNORMAL HIGH (ref 0.8–1.2)
Prothrombin Time: 22.9 seconds — ABNORMAL HIGH (ref 11.4–15.2)

## 2022-03-25 LAB — COMPREHENSIVE METABOLIC PANEL
ALT: 10 U/L (ref 0–44)
AST: 12 U/L — ABNORMAL LOW (ref 15–41)
Albumin: 2.7 g/dL — ABNORMAL LOW (ref 3.5–5.0)
Alkaline Phosphatase: 63 U/L (ref 38–126)
Anion gap: 13 (ref 5–15)
BUN: 62 mg/dL — ABNORMAL HIGH (ref 8–23)
CO2: 24 mmol/L (ref 22–32)
Calcium: 8 mg/dL — ABNORMAL LOW (ref 8.9–10.3)
Chloride: 99 mmol/L (ref 98–111)
Creatinine, Ser: 7.16 mg/dL — ABNORMAL HIGH (ref 0.61–1.24)
GFR, Estimated: 8 mL/min — ABNORMAL LOW (ref >60)
Glucose, Bld: 126 mg/dL — ABNORMAL HIGH (ref 70–99)
Potassium: 3.5 mmol/L (ref 3.5–5.1)
Sodium: 136 mmol/L (ref 135–145)
Total Bilirubin: 1.2 mg/dL (ref 0.3–1.2)
Total Protein: 6.2 g/dL — ABNORMAL LOW (ref 6.5–8.1)

## 2022-03-25 LAB — TROPONIN I (HIGH SENSITIVITY)
Troponin I (High Sensitivity): 68 ng/L — ABNORMAL HIGH (ref <18)
Troponin I (High Sensitivity): 67 ng/L — ABNORMAL HIGH (ref <18)
Troponin I (High Sensitivity): 73 ng/L — ABNORMAL HIGH (ref <18)
Troponin I (High Sensitivity): 66 ng/L — ABNORMAL HIGH (ref <18)
Troponin I (High Sensitivity): 64 ng/L — ABNORMAL HIGH (ref <18)

## 2022-03-25 LAB — LIPASE, BLOOD: Lipase: 24 U/L (ref 11–51)

## 2022-03-25 LAB — HEMOGLOBIN A1C
Hgb A1c MFr Bld: 4.8 % (ref 4.8–5.6)
Mean Plasma Glucose: 91.06 mg/dL

## 2022-03-25 LAB — BRAIN NATRIURETIC PEPTIDE: B Natriuretic Peptide: 2146.7 pg/mL — ABNORMAL HIGH (ref 0.0–100.0)

## 2022-03-25 MED ORDER — DELFLEX-LC/2.5% DEXTROSE 394 MOSM/L IP SOLN
INTRAPERITONEAL | Status: DC
  Administered 2022-03-26: 6000 mL via INTRAPERITONEAL
  Filled 2022-03-25 (×4): qty 3000

## 2022-03-25 MED ORDER — FUROSEMIDE 40 MG PO TABS
80.0000 mg | ORAL_TABLET | Freq: Every day | ORAL | Status: DC
  Administered 2022-03-26 – 2022-03-27 (×2): 80 mg via ORAL
  Filled 2022-03-25 (×2): qty 2

## 2022-03-25 MED ORDER — TAMSULOSIN HCL 0.4 MG PO CAPS
0.8000 mg | ORAL_CAPSULE | Freq: Every day | ORAL | Status: DC
  Administered 2022-03-25 – 2022-03-27 (×3): 0.8 mg via ORAL
  Filled 2022-03-25 (×3): qty 2

## 2022-03-25 MED ORDER — FUROSEMIDE 10 MG/ML IJ SOLN
100.0000 mg | Freq: Once | INTRAVENOUS | Status: AC
  Administered 2022-03-25: 100 mg via INTRAVENOUS
  Filled 2022-03-25: qty 10

## 2022-03-25 MED ORDER — AMIODARONE HCL 200 MG PO TABS
200.0000 mg | ORAL_TABLET | Freq: Every day | ORAL | Status: DC
  Administered 2022-03-25 – 2022-03-27 (×3): 200 mg via ORAL
  Filled 2022-03-25 (×3): qty 1

## 2022-03-25 MED ORDER — ALLOPURINOL 100 MG PO TABS
100.0000 mg | ORAL_TABLET | Freq: Every day | ORAL | Status: DC
  Administered 2022-03-25 – 2022-03-27 (×3): 100 mg via ORAL
  Filled 2022-03-25 (×3): qty 1

## 2022-03-25 MED ORDER — PREDNISONE 10 MG PO TABS
10.0000 mg | ORAL_TABLET | Freq: Every day | ORAL | Status: DC
  Administered 2022-03-26 – 2022-03-27 (×2): 10 mg via ORAL
  Filled 2022-03-25 (×2): qty 1

## 2022-03-25 MED ORDER — MORPHINE SULFATE (PF) 2 MG/ML IV SOLN: 1.0000 mg | INTRAVENOUS | Status: DC | PRN

## 2022-03-25 MED ORDER — MECLIZINE HCL 25 MG PO TABS: 12.5000 mg | ORAL_TABLET | Freq: Two times a day (BID) | ORAL | Status: DC | PRN

## 2022-03-25 MED ORDER — AMIODARONE HCL IN DEXTROSE 360-4.14 MG/200ML-% IV SOLN
60.0000 mg/h | INTRAVENOUS | Status: DC
  Administered 2022-03-25: 60 mg/h via INTRAVENOUS
  Filled 2022-03-25: qty 200

## 2022-03-25 MED ORDER — HYDRALAZINE HCL 20 MG/ML IJ SOLN: 5.0000 mg | INTRAMUSCULAR | Status: DC | PRN

## 2022-03-25 MED ORDER — HYDROMORPHONE HCL 1 MG/ML IJ SOLN
0.5000 mg | Freq: Four times a day (QID) | INTRAMUSCULAR | Status: DC | PRN
  Administered 2022-03-25: 0.5 mg via INTRAVENOUS
  Filled 2022-03-25: qty 0.5

## 2022-03-25 MED ORDER — PANTOPRAZOLE SODIUM 20 MG PO TBEC
20.0000 mg | DELAYED_RELEASE_TABLET | Freq: Every day | ORAL | Status: DC
  Administered 2022-03-25 – 2022-03-27 (×2): 20 mg via ORAL
  Filled 2022-03-25 (×5): qty 1

## 2022-03-25 MED ORDER — GENTAMICIN SULFATE 0.1 % EX CREA
1.0000 | TOPICAL_CREAM | Freq: Every day | CUTANEOUS | Status: DC
  Administered 2022-03-26 (×2): 1 via TOPICAL
  Filled 2022-03-25: qty 15

## 2022-03-25 MED ORDER — MIDODRINE HCL 5 MG PO TABS
5.0000 mg | ORAL_TABLET | Freq: Three times a day (TID) | ORAL | Status: DC
  Administered 2022-03-25 – 2022-03-27 (×8): 5 mg via ORAL
  Filled 2022-03-25 (×8): qty 1

## 2022-03-25 MED ORDER — FERROUS SULFATE 325 (65 FE) MG PO TABS
325.0000 mg | ORAL_TABLET | Freq: Every day | ORAL | Status: DC
  Administered 2022-03-25 – 2022-03-27 (×3): 325 mg via ORAL
  Filled 2022-03-25 (×3): qty 1

## 2022-03-25 MED ORDER — NORTRIPTYLINE HCL 25 MG PO CAPS
25.0000 mg | ORAL_CAPSULE | Freq: Every day | ORAL | Status: DC
  Administered 2022-03-25 – 2022-03-27 (×3): 25 mg via ORAL
  Filled 2022-03-25 (×4): qty 1

## 2022-03-25 MED ORDER — AMIODARONE HCL IN DEXTROSE 360-4.14 MG/200ML-% IV SOLN
30.0000 mg/h | INTRAVENOUS | Status: DC
  Administered 2022-03-25 (×2): 30 mg/h via INTRAVENOUS
  Filled 2022-03-25 (×2): qty 200

## 2022-03-25 MED ORDER — CLOPIDOGREL BISULFATE 75 MG PO TABS
75.0000 mg | ORAL_TABLET | Freq: Every day | ORAL | Status: DC
  Administered 2022-03-25 – 2022-03-27 (×3): 75 mg via ORAL
  Filled 2022-03-25 (×3): qty 1

## 2022-03-25 MED ORDER — AMIODARONE LOAD VIA INFUSION
150.0000 mg | Freq: Once | INTRAVENOUS | Status: AC
  Administered 2022-03-25: 150 mg via INTRAVENOUS
  Filled 2022-03-25: qty 83.34

## 2022-03-25 MED ORDER — NITROGLYCERIN 0.4 MG SL SUBL: 0.4000 mg | SUBLINGUAL_TABLET | SUBLINGUAL | Status: DC | PRN

## 2022-03-25 MED ORDER — DULOXETINE HCL 30 MG PO CPEP
60.0000 mg | ORAL_CAPSULE | Freq: Every day | ORAL | Status: DC
  Administered 2022-03-25 – 2022-03-27 (×3): 60 mg via ORAL
  Filled 2022-03-25: qty 2
  Filled 2022-03-25: qty 1
  Filled 2022-03-25: qty 2

## 2022-03-25 MED ORDER — AMIODARONE HCL IN DEXTROSE 360-4.14 MG/200ML-% IV SOLN
INTRAVENOUS | Status: AC
  Administered 2022-03-25: 60 mg/h via INTRAVENOUS
  Filled 2022-03-25: qty 200

## 2022-03-25 MED ORDER — ALBUTEROL SULFATE (2.5 MG/3ML) 0.083% IN NEBU: 3.0000 mL | INHALATION_SOLUTION | RESPIRATORY_TRACT | Status: DC | PRN

## 2022-03-25 MED ORDER — VITAMIN D 25 MCG (1000 UNIT) PO TABS
1000.0000 [IU] | ORAL_TABLET | Freq: Every day | ORAL | Status: DC
Start: 2022-03-25 — End: 2022-03-28
  Administered 2022-03-25 – 2022-03-27 (×3): 1000 [IU] via ORAL
  Filled 2022-03-25 (×3): qty 1

## 2022-03-25 MED ORDER — ACETAMINOPHEN 325 MG PO TABS: 650.0000 mg | ORAL_TABLET | Freq: Four times a day (QID) | ORAL | Status: DC | PRN

## 2022-03-25 MED ORDER — LEVETIRACETAM 500 MG PO TABS
500.0000 mg | ORAL_TABLET | Freq: Every day | ORAL | Status: DC
  Administered 2022-03-25 – 2022-03-27 (×3): 500 mg via ORAL
  Filled 2022-03-25 (×3): qty 1

## 2022-03-25 MED ORDER — LORAZEPAM 2 MG/ML IJ SOLN: 1.0000 mg | INTRAMUSCULAR | Status: DC | PRN

## 2022-03-25 MED ORDER — DM-GUAIFENESIN ER 30-600 MG PO TB12: 1.0000 | ORAL_TABLET | Freq: Two times a day (BID) | ORAL | Status: DC | PRN

## 2022-03-25 MED ORDER — ONDANSETRON HCL 4 MG/2ML IJ SOLN
4.0000 mg | Freq: Three times a day (TID) | INTRAMUSCULAR | Status: DC | PRN
  Administered 2022-03-26: 4 mg via INTRAVENOUS
  Filled 2022-03-25: qty 2

## 2022-03-25 MED ORDER — ATORVASTATIN CALCIUM 20 MG PO TABS
80.0000 mg | ORAL_TABLET | Freq: Every day | ORAL | Status: DC
  Administered 2022-03-25: 80 mg via ORAL
  Filled 2022-03-25: qty 4

## 2022-03-25 MED ORDER — HEPARIN SODIUM (PORCINE) 5000 UNIT/ML IJ SOLN
5000.0000 [IU] | Freq: Three times a day (TID) | INTRAMUSCULAR | Status: DC
  Filled 2022-03-25: qty 1

## 2022-03-25 MED ORDER — APIXABAN 5 MG PO TABS
5.0000 mg | ORAL_TABLET | Freq: Two times a day (BID) | ORAL | Status: DC
  Administered 2022-03-25 – 2022-03-27 (×6): 5 mg via ORAL
  Filled 2022-03-25 (×6): qty 1

## 2022-03-25 NOTE — ED Notes (Signed)
This rn called to give report to RN of [phone]

## 2022-03-25 NOTE — Assessment & Plan Note (Addendum)
Hemoglobin is close to baseline.  Platelet 126, WBC 7.5.  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 Follows closely with oncology at [facility], last seen 02/25/2022. He was unable to tolerate Jakafi well so discontinued March 2023. Prednisone 10 mg was continued during admission" -Continue prednisone 10 mg daily

## 2022-03-25 NOTE — ED Notes (Signed)
Pt moved into hospital bed, pt reports that this is much more comfortable, pt on the phone talking with wife.

## 2022-03-25 NOTE — ED Notes (Signed)
Pt in bed, pt reports 4/10 chest pain, states that he doesn't need anything for his pain at this time. Pt appears to be sinus rhythm on monitor.

## 2022-03-25 NOTE — Assessment & Plan Note (Addendum)
CAD, chest pain and myocardial injury due to fluid overload and A-fib with RVR: Troponin level 68, 67.  Patient recently was hospitalized due to non-STEMI, troponin was elevated to 722.  He had troponin to 98 on 03/13/2022.  His troponin is titrating down.  -Continue Plavix -As needed nitroglycerin -Trend troponin -Check A1c, FLP -Pt has been taken off Statin(s) due to severe Myalgia

## 2022-03-25 NOTE — Assessment & Plan Note (Signed)
No wheezing or rhonchi on auscultation. -Bronchodilators 

## 2022-03-25 NOTE — ED Notes (Signed)
Talked with md, pt to get all 10am medications, meds given.

## 2022-03-25 NOTE — ED Provider Notes (Signed)
[facility] Provider Note    Event Date/Time   First MD Initiated Contact with Patient 03/25/22 0129     (approximate)   History   Chest Pain   HPI  James Arnold is a 66 y.o. male with history of chronic renal failure on peritoneal dialysis and a history of heart disease status post recent NSTEMI with an extended hospitalization at [facility] where he had PCI.  This was several weeks ago.  He presents tonight by EMS for acute onset shortness of breath and chest pressure.  He said that he was doing dialysis at home but was unable to finish the process when he became increasingly short of breath.  He said that he has been having some shortness of breath has been worsening over the last several days but it became severe tonight and included the chest pressure.  He has been mild to moderate distress upon arrival.  He denies fever, sore throat, nausea, vomiting, and abdominal pain.     Physical Exam   Triage Vital Signs: ED Triage Vitals  Enc Vitals Group     BP 03/25/22 0119 136/76     Pulse Rate 03/25/22 0119 (!) 113     Resp 03/25/22 0119 (!) 22     Temp 03/25/22 0119 98 F (36.7 C)     Temp Source 03/25/22 0119 Oral     SpO2 03/25/22 0114 97 %     Weight 03/25/22 0120 67 kg (147 lb 11.3 oz)     Height 03/25/22 0120 1.702 m ('5\' 7"'$ )     Head Circumference --      Peak Flow --      Pain Score 03/25/22 0120 7     Pain Loc --      Pain Edu? --      Excl. in [place]? --     Most recent vital signs: Vitals:   03/25/22 0849 03/25/22 0900  BP: 98/72 112/78  Pulse: (!) 150 (!) 132  Resp: (!) 28 (!) 23  Temp:    SpO2: 98% 97%     General: Awake, mild to moderate respiratory distress. CV:  Good peripheral perfusion.  Tachycardia Resp:  Slightly increased respiratory effort with accessory muscle usage.  Some coarse breath sounds, but difficult to appreciate. Abd:  Mild abdominal distention.  No tenderness to palpation.   ED Results / Procedures / Treatments    Labs (all labs ordered are listed, but only abnormal results are displayed) Labs Reviewed  COMPREHENSIVE METABOLIC PANEL - Abnormal; Notable for the following components:      Result Value   Glucose, Bld 126 (*)    BUN 62 (*)    Creatinine, Ser 7.16 (*)    Calcium 8.0 (*)    Total Protein 6.2 (*)    Albumin 2.7 (*)    AST 12 (*)    GFR, Estimated 8 (*)    All other components within normal limits  CBC WITH DIFFERENTIAL/PLATELET - Abnormal; Notable for the following components:   RBC 3.04 (*)    Hemoglobin 8.4 (*)    HCT 28.1 (*)    MCHC 29.9 (*)    RDW 17.5 (*)    Platelets 126 (*)    Lymphs Abs 0.3 (*)    All other components within normal limits  PROTIME-INR - Abnormal; Notable for the following components:   Prothrombin Time 22.9 (*)    INR 2.1 (*)    All other components within normal limits  BRAIN NATRIURETIC PEPTIDE - Abnormal; Notable for the following components:   B Natriuretic Peptide 2,146.7 (*)    All other components within normal limits  TROPONIN I (HIGH SENSITIVITY) - Abnormal; Notable for the following components:   Troponin I (High Sensitivity) 68 (*)    All other components within normal limits  TROPONIN I (HIGH SENSITIVITY) - Abnormal; Notable for the following components:   Troponin I (High Sensitivity) 67 (*)    All other components within normal limits  LIPASE, BLOOD  HEMOGLOBIN A1C  TROPONIN I (HIGH SENSITIVITY)     EKG  ED ECG REPORT I, Hinda Kehr, the attending physician, personally viewed and interpreted this ECG.  Date: 03/25/2022 EKG Time: 1:17 AM Rate: 114 Rhythm: Sinus tachycardia QRS Axis: normal Intervals: LVH with secondary repolarization abnormality ST/T Wave abnormalities: Non-specific ST segment / T-wave changes, but no clear evidence of acute ischemia. Narrative Interpretation: no definitive evidence of acute ischemia; does not meet STEMI criteria.    RADIOLOGY I viewed and interpreted the patient's 1 view chest  x-ray and it appears consistent with some interstitial pulmonary edema and CHF.  Radiology report confirms.    PROCEDURES:  Critical Care performed: Yes, see critical care procedure note(s)  .1-3 Lead EKG Interpretation  Performed by: Hinda Kehr, MD Authorized by: Hinda Kehr, MD     Interpretation: abnormal     ECG rate:  120   ECG rate assessment: tachycardic     Rhythm: sinus tachycardia     Ectopy: none     Conduction: normal   .Critical Care  Performed by: Hinda Kehr, MD Authorized by: Hinda Kehr, MD   Critical care provider statement:    Critical care time (minutes):  40   Critical care time was exclusive of:  Separately billable procedures and treating other patients   Critical care was necessary to treat or prevent imminent or life-threatening deterioration of the following conditions:  Cardiac failure   Critical care was time spent personally by me on the following activities:  Development of treatment plan with patient or surrogate, evaluation of patient's response to treatment, examination of patient, obtaining history from patient or surrogate, ordering and performing treatments and interventions, ordering and review of laboratory studies, ordering and review of radiographic studies, pulse oximetry, re-evaluation of patient's condition and review of old charts    MEDICATIONS ORDERED IN ED: Medications  albuterol (PROVENTIL) (2.5 MG/3ML) 0.083% nebulizer solution 3 mL (has no administration in time range)  dextromethorphan-guaiFENesin (MUCINEX DM) 30-600 MG per 12 hr tablet 1 tablet (has no administration in time range)  ondansetron (ZOFRAN) injection 4 mg (has no administration in time range)  acetaminophen (TYLENOL) tablet 650 mg (has no administration in time range)  hydrALAZINE (APRESOLINE) injection 5 mg (has no administration in time range)  LORazepam (ATIVAN) injection 1 mg (has no administration in time range)  allopurinol (ZYLOPRIM) tablet 100 mg  (has no administration in time range)  furosemide (LASIX) tablet 80 mg (has no administration in time range)  midodrine (PROAMATINE) tablet 5 mg (has no administration in time range)  nitroGLYCERIN (NITROSTAT) SL tablet 0.4 mg (has no administration in time range)  DULoxetine (CYMBALTA) DR capsule 60 mg (has no administration in time range)  nortriptyline (PAMELOR) capsule 25 mg (has no administration in time range)  meclizine (ANTIVERT) tablet 12.5 mg (has no administration in time range)  pantoprazole (PROTONIX) EC tablet 20 mg (has no administration in time range)  tamsulosin (FLOMAX) capsule 0.8 mg (has no administration in time  range)  clopidogrel (PLAVIX) tablet 75 mg (has no administration in time range)  ferrous sulfate tablet 325 mg (325 mg Oral Given 03/25/22 0845)  levETIRAcetam (KEPPRA) tablet 500 mg (has no administration in time range)  cholecalciferol (VITAMIN D3) tablet 1,000 Units (has no administration in time range)  amiodarone (PACERONE) tablet 200 mg (has no administration in time range)  amiodarone (NEXTERONE) 1.8 mg/mL load via infusion 150 mg (150 mg Intravenous Bolus from Bag 03/25/22 0900)    Followed by  amiodarone (NEXTERONE PREMIX) 360-4.14 MG/200ML-% (1.8 mg/mL) IV infusion (60 mg/hr Intravenous New Bag/Given 03/25/22 0900)    Followed by  amiodarone (NEXTERONE PREMIX) 360-4.14 MG/200ML-% (1.8 mg/mL) IV infusion (has no administration in time range)  atorvastatin (LIPITOR) tablet 80 mg (has no administration in time range)  apixaban (ELIQUIS) tablet 5 mg (has no administration in time range)  furosemide (LASIX) 100 mg in dextrose 5 % 50 mL IVPB (0 mg Intravenous Stopped 03/25/22 0614)     IMPRESSION / MDM / ASSESSMENT AND PLAN / ED COURSE  I reviewed the triage vital signs and the nursing notes.                              Differential diagnosis includes, but is not limited to, CHF exacerbation, ACS, PE, Dressler syndrome.  Patient's presentation is most  consistent with acute presentation with potential threat to life or bodily function.  Patient's issue appears to be primarily pulmonary and I suspect volume overload.  He is not having sharp chest pain.  EKG does not indicate STEMI.  Labs ordered include  The patient is on the cardiac monitor to evaluate for evidence of arrhythmia and/or significant heart rate changes.  CMP, CBC with differential, pro time INR, lipase, high-sensitivity troponin, BNP.  Patient has started to calm down now.  I put on 2 L of oxygen by nasal cannula for comfort but is satting 100%.  We will  Clinical Course as of 03/25/22 0917  Tue Mar 25, 2022  0147 CBC with Differential(!) Stable hemoglobin from prior values, no leukocytosis [CF]  0209 Troponin I (High Sensitivity)(!): 68 Mild troponin elevation, but down from previous values [CF]  0209 Comprehensive metabolic panel(!) Notable for chronic renal failure with the patient is on peritoneal dialysis so this is not surprising.  Electrolytes are generally reassuring. [CF]  0435 Troponin I (High Sensitivity)(!) [CF]  0435 Troponin I (High Sensitivity)(!): 67 Repeat troponin is stable and reassuring [CF]  0436 I had an extensive conversation with the patient about the results.  It appears that he is volume overloaded but is not having a primary coronary event.  He said he feels better with the oxygen, however when I went back and his oxygen had been turned off and he just had on the nasal cannula.  He is breathing easily and comfortably.  We both agreed that there was likely an element of anxiety or panic when he started feeling short of breath earlier.  I offered to admit him to the hospital for his volume overload and shortness of breath.  He does not want to stay if it is possible to avoid it.  He still produces urine and occasionally takes furosemide.  As an alternative, I suggested that we administer some furosemide and see if he can start diuresing and then he can go  home and use his peritoneal dialysis machine at home.  He said that would be his preference.  I ordered furosemide 100 mg IV (he takes 80 at home and we will reassess. [CF]  [id number] I consulted Dr. Zollie Scale by phone.  He is very familiar with the patient.  We looked together at the last echocardiogram from [facility] and the report shows that the patient has an EF between 20 and 25%.  Dr. Holley Raring feels that the patient would be safest in the hospital.  I went back in to reassess the patient.  He appears more labored although he says he feels okay, better than when he initially arrived.  We talked about the concerns that he needs hospitalization and he reluctantly agreed based on Dr. Elwyn Lade recommendations. [CF]  [phone] Consulting the hospitalist for admission.  Started the patient back on 2 L of oxygen for comfort.  Allowing sips of ice water. [CF]    Clinical Course User Index [CF] Hinda Kehr, MD     FINAL CLINICAL IMPRESSION(S) / ED DIAGNOSES   Final diagnoses:  Acute on chronic congestive heart failure, unspecified heart failure type [facility])  Peritoneal dialysis catheter in place [facility])  Chronic renal failure, stage 5 ([place])     Rx / DC Orders   ED Discharge Orders     None        Note:  This document was prepared using Dragon voice recognition software and may include unintentional dictation errors.   Hinda Kehr, MD 03/25/22 [phone]

## 2022-03-25 NOTE — Assessment & Plan Note (Signed)
-   Continue home medications 

## 2022-03-25 NOTE — ED Notes (Signed)
Pt given a cup of ice water. Ok per Dr. Karma Greaser

## 2022-03-25 NOTE — Assessment & Plan Note (Signed)
Platelets 126 -Follow-up with CBC

## 2022-03-25 NOTE — Assessment & Plan Note (Addendum)
Patient's blood pressure is soft -Patient is on Lasix -IV hydralazine as needed -On midodrine 5 mg daily

## 2022-03-25 NOTE — Assessment & Plan Note (Signed)
-   Flomax 

## 2022-03-25 NOTE — ED Notes (Signed)
Md called back, updated md, orders for amiodarone obtained and meds given.

## 2022-03-25 NOTE — ED Notes (Signed)
Pt given warm blankets.

## 2022-03-25 NOTE — Assessment & Plan Note (Signed)
See above

## 2022-03-25 NOTE — ED Notes (Signed)
Md [place] paged and texted about rate change

## 2022-03-25 NOTE — Assessment & Plan Note (Signed)
-   Seizure precaution -Continue home Keppra 500 mg daily -As needed Ativan for seizure

## 2022-03-25 NOTE — ED Triage Notes (Signed)
Pt began having chest pain at 1600 pt took nitro, pt started his peritoneal dialysis, still experiencing chest pain so he took another nitro at 2300. Pt did not finish dialysis. Pt was just dc from [facility] for a heart attack and had a balloon procedure.

## 2022-03-25 NOTE — Assessment & Plan Note (Signed)
Heart rate up to 140-150s.  Patient still taking amiodarone at home -Started amiodarone drip -Continue home oral amiodarone 200 mg daily

## 2022-03-25 NOTE — Assessment & Plan Note (Signed)
Nephrology consulted and patient received peritoneal dialysis on 7/5.

## 2022-03-25 NOTE — ED Notes (Signed)
Pt in bed, resps even and unlabored, slightly elevated RR, pt on room air satting 95%, pt appears to be sinus tach on the cardiac monitor, pt reports some abd soreness, states that he doesn't need anything for it at this time.

## 2022-03-25 NOTE — Progress Notes (Signed)
[facility]  ROUNDING NOTE   Subjective:  Patient well-known to [place] as we follow him for outpatient hemodialysis treatments. Has extensive cardiac work-up and was recently seen at [facility] for non-ST elevation myocardial infarction. Patient developed shortness of breath while on peritoneal dialysis yesterday. Subsequently came in for admission. Came in atrial fibrillation with rapid ventricular response with heart rate in the 140s to 150s.   Objective:  Vital signs in last 24 hours:  Temp:  [97.8 F (36.6 C)-98 F (36.7 C)] 97.9 F (36.6 C) (07/04 1451) Pulse Rate:  [79-150] 96 (07/04 1451) Resp:  [17-30] 19 (07/04 1451) BP: (95-136)/(57-78) 98/58 (07/04 1451) SpO2:  [94 %-100 %] 100 % (07/04 1451) Weight:  [67 kg] 67 kg (07/04 0120)  Weight change:  Filed Weights   03/25/22 0120  Weight: 67 kg    Intake/Output: No intake/output data recorded.   Intake/Output this shift:  Total I/O In: 256.1 [I.V.:256.1] Out: -   Physical Exam: General: No acute distress  Head: Normocephalic, atraumatic. Moist oral mucosal membranes  Neck: Supple  Lungs:  Clear to auscultation, normal effort  Heart: S1S2 irregular  Abdomen:  Soft, nontender, bowel sounds present  Extremities: Trace peripheral edema.  Neurologic: Awake, alert, following commands  Skin: No acute rash  Access: Peritoneal dialysis catheter    Basic Metabolic Panel: Recent Labs  Lab 03/21/22 1140 03/25/22 0129  NA 137 136  K 4.1 3.5  CL 96 99  CO2 22 24  GLUCOSE 146* 126*  BUN 44* 62*  CREATININE 7.08* 7.16*  CALCIUM 8.1* 8.0*    Liver Function Tests: Recent Labs  Lab 03/25/22 0129  AST 12*  ALT 10  ALKPHOS 63  BILITOT 1.2  PROT 6.2*  ALBUMIN 2.7*   Recent Labs  Lab 03/25/22 0129  LIPASE 24   No results for input(s): "AMMONIA" in the last 168 hours.  CBC: Recent Labs  Lab 03/21/22 1140 03/25/22 0129  WBC 8.6 7.5  NEUTROABS 7.9* 6.8  HGB 8.8* 8.4*  HCT 27.2* 28.1*  MCV 89  92.4  PLT 188 126*    Cardiac Enzymes: No results for input(s): "CKTOTAL", "CKMB", "CKMBINDEX", "TROPONINI" in the last 168 hours.  BNP: Invalid input(s): "POCBNP"  CBG: No results for input(s): "GLUCAP" in the last 168 hours.  Microbiology: Results for orders placed or performed in visit on 01/08/22  Microscopic Examination     Status: None   Collection Time: 01/08/22  9:01 AM   Urine  Result Value Ref Range Status   WBC, UA 0-5 0 - 5 /hpf Final   RBC, Urine 0-2 0 - 2 /hpf Final   Epithelial Cells (non renal) 0-10 0 - 10 /hpf Final   Bacteria, UA None seen None seen/Few Final    Coagulation Studies: Recent Labs    03/25/22 0129  LABPROT 22.9*  INR 2.1*    Urinalysis: No results for input(s): "COLORURINE", "LABSPEC", "PHURINE", "GLUCOSEU", "HGBUR", "BILIRUBINUR", "KETONESUR", "PROTEINUR", "UROBILINOGEN", "NITRITE", "LEUKOCYTESUR" in the last 72 hours.  Invalid input(s): "APPERANCEUR"    Imaging: DG Chest Portable 1 View  Result Date: 03/25/2022 CLINICAL DATA:  Acute dyspnea and chest pain EXAM: PORTABLE CHEST 1 VIEW COMPARISON:  03/03/2022 FINDINGS: Cardiac shadow is stable. Aortic calcifications are again seen. Vascular congestion and mild interstitial edema is noted stable in appearance from the prior exam consistent with CHF. No focal infiltrate is noted. IMPRESSION: CHF. Electronically Signed   By: Inez Catalina M.D.   On: 03/25/2022 01:52  Medications:    amiodarone 30 mg/hr (03/25/22 1448)    allopurinol  100 mg Oral Daily   amiodarone  200 mg Oral Daily   apixaban  5 mg Oral BID   atorvastatin  80 mg Oral Daily   cholecalciferol  1,000 Units Oral Daily   clopidogrel  75 mg Oral Daily   DULoxetine  60 mg Oral Daily   ferrous sulfate  325 mg Oral Q breakfast   [START ON 03/26/2022] furosemide  80 mg Oral Daily   levETIRAcetam  500 mg Oral QHS   midodrine  5 mg Oral TID WC   nortriptyline  25 mg Oral QHS   pantoprazole  20 mg Oral Daily   [START ON  03/26/2022] predniSONE  10 mg Oral Q breakfast   tamsulosin  0.8 mg Oral Daily   acetaminophen, albuterol, dextromethorphan-guaiFENesin, hydrALAZINE, LORazepam, meclizine, nitroGLYCERIN, ondansetron (ZOFRAN) IV  Assessment/ Plan:  66 y.o. male with past medical history of ESRD on PD, coronary artery disease status post multiple stent placement, hypertension, hyperlipidemia who presented with shortness of breath and found to be in atrial fibrillation with RVR.  1.  ESRD on PD.  We will resume normal PD schedule.  Fill volumes 2.5 L, 6 exchanges, 2.5% dextrose solution to be used given volume overload.  2.  Atrial fibrillation with rapid ventricular response.  Continue amiodarone and Eliquis.  3.  Anemia of chronic kidney disease.  Winfield Rast as an outpatient.  Monitor hemoglobin over the course of the hospitalization.  4.  Hypotension.  Maintain the patient on midodrine.   LOS: 0 Antwonette Feliz 7/4/20233:09 PM

## 2022-03-25 NOTE — Assessment & Plan Note (Addendum)
Patient has trace leg edema, positive JVD, elevated BNP, interstitial edema by chest x-ray, clinically consistent with fluid overload.  This is due to combination of ESRD and history of CHF.  Patient is still making little urine. Consulted Dr. Holley Raring of renal for dialysis.  -Will admit to progressive unit as inpatient -Daily weights -strict I/O's -Low salt diet -Fluid restriction -Obtain REDs Vest reading -Volume management per renal dialysis -continue home lasix 80 mg daily

## 2022-03-25 NOTE — Assessment & Plan Note (Addendum)
-   Patient is on Eliquis for A-fib -Plavix

## 2022-03-25 NOTE — Assessment & Plan Note (Signed)
2D echo on 12/25/2021 showed EF of 30 to 35% with grade 2 diastolic dysfunction.  Now patient has fluid overload. -Continue Lasix -Volume management per renal by dialysis

## 2022-03-25 NOTE — Assessment & Plan Note (Addendum)
Hemoglobin initially 8.4 (8.8 on 03/21/2022), however CBC on 7/6 noted hemoglobin of 7.0.  No evidence of active bleeding.  Transfused 1 unit packed red blood cells.  Patient given Lasix afterwards.  Given his cardiac issues, best to keep his hemoglobin above 8.0.

## 2022-03-25 NOTE — H&P (Addendum)
History and Physical    James Arnold UMP:[id number] DOB: Sep 04, 1956 DOA: 03/25/2022  Referring MD/NP/PA:   PCP: Valerie Roys, DO   Patient coming from:  The patient is coming from home.  At baseline, pt is independent for most of ADL.        Chief Complaint: SOB  HPI: James Arnold is a 66 y.o. male with medical history significant of ESRD-PD, CAD (s/p of stent), hypertension, hyperlipidemia, COPD, stroke, GERD, gout, depression, aortic stenosis, atrial fibrillation on Eliquis, CHF with EF 30-35%, anemia, former smoker, thrombocytopenia, myelofibrosis with positive JAK2, BPH, seizure, who presents with shortness breath.  Patient was hospitalized to [facility] due to non-STEMI on 6/15.  Patient was status post POBA 6/16 of prior stent.  His medication was adjusted at the discharge.  Patient is on Eliquis 5 mg twice daily for atrial fibrillation.  Aspirin was discontinued.  Patient is discharged on Plavix 75 mg daily. He is doing peritoneal dialysis.  He states that he developed shortness of breath since yesterday, which has been progressively worsening.  Patient has mild dry cough, no fever or chills.  He also has chest pain, which is located in the substernal area, initially 6 out of 10 in severity, currently mild, aching, nonradiating. Patient does not have nausea, vomiting, diarrhea or abdominal pain.  No symptoms of UTI.  Patient was found to have atrial fibrillation with RVR, heart rate up to 140-150ss.  Amiodarone drip is started in the ED.  Data reviewed independently and ED Course: pt was found to have troponin 68, CD7 (patient had an elevated troponin up to 722, which decreased to 2-98 on 03/13/2022 in recent admission to [facility]), INR 2.1, BNP 2146, potassium 3.5, bicarbonate of 24, creatinine 7.16, BUN 62.  Temperature normal, blood pressure 120/66, RR 27, oxygen saturation 94% on room air currently.  Chest x-ray showed vascular congestion and interstitial edema.  Patient is admitted to PCU as  inpatient  EKG: I have personally reviewed.  Reports the EKG showed sinus rhythm, QTc 451, LAE, LVH, mild T wave inversion in V4-V6.  The second EKG showed atrial fibrillation, with heart rate 151.   Review of Systems:   General: no fevers, chills, no body weight gain, has fatigue HEENT: no blurry vision, hearing changes or sore throat Respiratory: has dyspnea, coughing, no wheezing CV: has chest pain, no palpitations GI: no nausea, vomiting, abdominal pain, diarrhea, constipation GU: no dysuria, burning on urination, increased urinary frequency, hematuria  Ext: has trace leg edema Neuro: no unilateral weakness, numbness, or tingling, no vision change or hearing loss Skin: no rash, no skin tear. MSK: No muscle spasm, no deformity, no limitation of range of movement in spin Heme: No easy bruising.  Travel history: No recent long distant travel.   Allergy:  Allergies  Allergen Reactions   Losartan Shortness Of Breath    Past Medical History:  Diagnosis Date   Anemia    Benign hypertensive renal disease    CAD (coronary artery disease)    a. 08/1996 s/p BMS to the mLAD ([facility]); b. 12/2017 MV: EF 46%, --> low risk; c. 07/2020 MV: EF 51%, no ischemia/infarct; d. 08/2021 Cath/PCI: LM nl, LAD 60p, 95p/m ISR (3.0x48 Synergy DES), RI small, nl, LCX 40p, 95d(3.5x16 Synergy DES), OM1 50, RCA 40p/182m   Carotid arterial disease ([place]    a. 11/2020 Carotid U/S: <50% bilat ICA stenoses.   CHF (congestive heart failure) (HCC)    Chronic HFrEF (heart failure with reduced ejection  fraction) ([place])    a. 08/2022 Echo: EF 35-40%.   CKD (chronic kidney disease), stage V ([place])    on peritoneal dialysis   Complication of anesthesia    please call by "[name]" when waking up!!   COPD (chronic obstructive pulmonary disease) (HCC)    centrilobular emphysema. pt is poorly controlled with his copd/smoking.   Depression    Diastolic dysfunction    a. 12/2017 Echo: EF 60-65%, no rwma, Gr1 DD, mild  AI/MR. Nl RVSP; b. 08/2020 Echo: EF 60-65%, no rwma, GrI DD, nl RV fxn. RVSP 32.39mHg.   Dyspnea on exertion    with exertion   Fatigue    FSGS (focal segmental glomerulosclerosis)    Hyperlipidemia 10/2002   Hypertension    Ischemic cardiomyopathy    a. 08/2022 Echo: EF 35-40%, glob HK, GrII DD, nl RV fxn, mildly dil LA, mild MR, mild AS.   myelofibrosis    bone marrow failure   [place]elofibrosis ([place] 2010   JAK2 (+) primary   [place]ocardial infarction ([place] 1997   stent x 1   Pneumoperitoneum 02/2021   PSVT (paroxysmal supraventricular tachycardia) ([place]    a. 12/2020 Zio: Sinus rhythm 80 (55-117). Rare PACs/PVCs. 17 episodes of SVT (longest 20.3 secs, fastest 184). No triggered events. No afib.   Smoker    Stroke ([facility]    a. 11/2020 MRI Brain: patchy acute/early subacute cortical infarcts w/in the R frontal, parietal, and occiptal lobes. Small, chronic L basal ganglia lacunar infarct.   Thrombocytopenia [place]    Tobacco abuse 11/07/2020    Past Surgical History:  Procedure Laterality Date   ANGIOPLASTY     BONE MARROW ASPIRATION  03/2009   CARDIAC CATHETERIZATION  1997   stent x 1   COLONOSCOPY WITH PROPOFOL N/A 08/09/2018   Procedure: COLONOSCOPY WITH PROPOFOL;  Surgeon: AJonathon Bellows MD;  [facility];  Service: Gastroenterology;  Laterality: N/A;   CORONARY ARTERY BYPASS GRAFT     CORONARY STENT PLACEMENT  08/1996   ESOPHAGOGASTRODUODENOSCOPY (EGD) WITH PROPOFOL N/A 07/25/2021   Procedure: ESOPHAGOGASTRODUODENOSCOPY (EGD) WITH PROPOFOL;  Surgeon: AJonathon Bellows MD;  [facility];  Service: Gastroenterology;  Laterality: N/A;   EXTERIORIZATION OF A CONTINUOUS AMBULATORY PERITONEAL DIALYSIS CATHETER     EYE SURGERY Bilateral    cats removed   LEFT HEART CATH AND CORONARY ANGIOGRAPHY N/A 09/12/2021   Procedure: LEFT HEART CATH AND CORONARY ANGIOGRAPHY;  Surgeon: AWellington Hampshire MD;  [facility];  Service: Cardiovascular;  Laterality: N/A;    REVERSE SHOULDER ARTHROPLASTY Left 11/19/2020   Procedure: Left reverse shoulder arthroplasty;  Surgeon: PLeim Fabry MD;  Location: [facility] ORS;  Service: Orthopedics;  Laterality: Left;    Social History:  reports that he quit smoking about 6 months ago. His smoking use included cigarettes. He has a 23.50 pack-year smoking history. He has never used smokeless tobacco. He reports current alcohol use. He reports that he does not use drugs.  Family History:  Family History  Problem Relation Age of Onset   Stroke Mother        Low BP stroke   Depression Father    Depression Sister        Breast   Heart disease Other    Breast cancer Other    Lung cancer Other    Ovarian cancer Other    Stomach cancer Other      Prior to Admission medications   Medication Sig Start Date End Date Taking? Authorizing Provider  allopurinol (ZYLOPRIM) 100 MG tablet Take 100 mg by mouth daily.   Yes [provider]  amiodarone (PACERONE) 200 MG tablet Take 200 mg by mouth daily. 03/15/22  Yes [provider]  amiodarone (PACERONE) 400 MG tablet Take 1 tablet by mouth in the morning, at noon, and at bedtime. X 3 days 03/15/22  Yes [provider]  Cholecalciferol (VITAMIN D-1000 MAX ST) 25 MCG (1000 UT) tablet Take 1,000 Units by mouth daily.    Yes [provider]  clopidogrel (PLAVIX) 75 MG tablet Take 75 mg by mouth daily. 09/20/21  Yes [provider]  DULoxetine (CYMBALTA) 60 MG capsule Take 1 capsule (60 mg total) by mouth daily. 01/30/22  Yes Johnson, Megan P, DO  ferrous sulfate 325 (65 FE) MG tablet Take 1 tablet (325 mg total) by mouth daily with breakfast. 12/30/17 08/06/23 Yes Pyreddy, Reatha Harps, MD  furosemide (LASIX) 80 MG tablet Take 1 tablet (80 mg total) by mouth daily. Increased from 20 mg daily. 12/24/21 03/25/22 Yes Enzo Bi, MD  gentamicin cream (GARAMYCIN) 0.1 % Apply 1 application topically every other day. 03/18/19  Yes [provider]   levETIRAcetam (KEPPRA) 500 MG tablet Take 1 tablet (500 mg total) by mouth at bedtime. 11/24/20  Yes Wieting, [name], MD  midodrine (PROAMATINE) 5 MG tablet Take 1 tablet (5 mg total) by mouth 3 (three) times daily with meals. 03/05/22 04/04/22 Yes Wyvonnia Dusky, MD  nitroGLYCERIN (NITROSTAT) 0.4 MG SL tablet Place 1 tablet (0.4 mg total) under the tongue every 5 (five) minutes as needed for chest pain. 09/12/21  Yes Enzo Bi, MD  nortriptyline (PAMELOR) 25 MG capsule Take 1 capsule (25 mg total) by mouth at bedtime. 10/16/21  Yes Johnson, Megan P, DO  pantoprazole (PROTONIX) 20 MG tablet Take 20 mg by mouth daily. 08/10/21  Yes [provider]  tamsulosin (FLOMAX) 0.4 MG CAPS capsule Take 2 capsules (0.8 mg total) by mouth daily. 01/02/22  Yes Johnson, Megan P, DO  acetaminophen (TYLENOL) 500 MG tablet Take 500 mg by mouth every 6 (six) hours as needed for headache, fever, moderate pain or mild pain.    [provider]  albuterol (PROVENTIL) (2.5 MG/3ML) 0.083% nebulizer solution Take 3 mLs (2.5 mg total) by nebulization every 6 (six) hours as needed for wheezing or shortness of breath. 12/12/21   Johnson, Megan P, DO  albuterol (VENTOLIN HFA) 108 (90 Base) MCG/ACT inhaler Inhale 2 puffs into the lungs every 6 (six) hours as needed for wheezing or shortness of breath. 02/27/21   Borders, Kirt Boys, NP  aspirin 81 MG chewable tablet Chew 1 tablet (81 mg total) by mouth daily. Patient not taking: Reported on 03/18/2022 01/08/21   Park Liter P, DO  carvedilol (COREG) 6.25 MG tablet Take 1 tablet (6.25 mg total) by mouth 2 (two) times daily with a meal. Patient not taking: Reported on 03/18/2022 03/05/22 04/04/22  Wyvonnia Dusky, MD  meclizine (ANTIVERT) 12.5 MG tablet Take 1 tablet (12.5 mg total) by mouth 2 (two) times daily as needed for dizziness. 11/25/21   Cammie Sickle, MD    Physical Exam: Vitals:   03/25/22 1330 03/25/22 1400 03/25/22 1430 03/25/22 1451  BP: 95/61  99/60 99/61 (!) 98/58  Pulse: 96 99 98 96  Resp: (!) 21 (!) '22 17 19  '$ Temp:    97.9 F (36.6 C)  TempSrc:    Oral  SpO2: 100% 100% 100% 100%  Weight:      Height:  General: Not in acute distress HEENT:       Eyes: PERRL, EOMI, no scleral icterus.       ENT: No discharge from the ears and nose, no pharynx injection, no tonsillar enlargement.        Neck: positive JVD, no bruit, no mass felt. Heme: No neck lymph node enlargement. Cardiac: S1/S2, irregularly irregular rhythm, No gallops or rubs. Respiratory: Has fine crackles bilaterally GI: Soft, nondistended, nontender, no rebound pain, no organomegaly, BS present. GU: No hematuria Ext: has trace leg edema bilaterally. 1+DP/PT pulse bilaterally. Musculoskeletal: No joint deformities, No joint redness or warmth, no limitation of ROM in spin. Skin: No rashes.  Neuro: Alert, oriented X3, cranial nerves II-XII grossly intact, moves all extremities normally. Psych: Patient is not psychotic, no suicidal or hemocidal ideation.  Labs on Admission: I have personally reviewed following labs and imaging studies  CBC: Recent Labs  Lab 03/21/22 1140 03/25/22 0129  WBC 8.6 7.5  NEUTROABS 7.9* 6.8  HGB 8.8* 8.4*  HCT 27.2* 28.1*  MCV 89 92.4  PLT 188 628*   Basic Metabolic Panel: Recent Labs  Lab 03/21/22 1140 03/25/22 0129  NA 137 136  K 4.1 3.5  CL 96 99  CO2 22 24  GLUCOSE 146* 126*  BUN 44* 62*  CREATININE 7.08* 7.16*  CALCIUM 8.1* 8.0*   GFR: Estimated Creatinine Clearance: 9.6 mL/min (A) (by C-G formula based on SCr of 7.16 mg/dL (H)). Liver Function Tests: Recent Labs  Lab 03/25/22 0129  AST 12*  ALT 10  ALKPHOS 63  BILITOT 1.2  PROT 6.2*  ALBUMIN 2.7*   Recent Labs  Lab 03/25/22 0129  LIPASE 24   No results for input(s): "AMMONIA" in the last 168 hours. Coagulation Profile: Recent Labs  Lab 03/25/22 0129  INR 2.1*   Cardiac Enzymes: No results for input(s): "CKTOTAL", "CKMB", "CKMBINDEX",  "TROPONINI" in the last 168 hours. BNP (last 3 results) No results for input(s): "PROBNP" in the last 8760 hours. HbA1C: Recent Labs    03/25/22 0843  HGBA1C 4.8   CBG: No results for input(s): "GLUCAP" in the last 168 hours. Lipid Profile: No results for input(s): "CHOL", "HDL", "LDLCALC", "TRIG", "CHOLHDL", "LDLDIRECT" in the last 72 hours. Thyroid Function Tests: No results for input(s): "TSH", "T4TOTAL", "FREET4", "T3FREE", "THYROIDAB" in the last 72 hours. Anemia Panel: No results for input(s): "VITAMINB12", "FOLATE", "FERRITIN", "TIBC", "IRON", "RETICCTPCT" in the last 72 hours. Urine analysis:    Component Value Date/Time   COLORURINE YELLOW (A) 03/01/2021 0719   APPEARANCEUR Clear 01/08/2022 0901   LABSPEC 1.009 03/01/2021 0719   PHURINE 6.0 03/01/2021 0719   GLUCOSEU Negative 01/08/2022 0901   HGBUR NEGATIVE 03/01/2021 0719   BILIRUBINUR Negative 01/08/2022 0901   KETONESUR NEGATIVE 03/01/2021 0719   PROTEINUR 1+ (A) 01/08/2022 0901   PROTEINUR 100 (A) 03/01/2021 0719   NITRITE Negative 01/08/2022 0901   NITRITE NEGATIVE 03/01/2021 0719   LEUKOCYTESUR Negative 01/08/2022 0901   LEUKOCYTESUR NEGATIVE 03/01/2021 0719   Sepsis Labs: '@LABRCNTIP'$ (procalcitonin:4,lacticidven:4) )No results found for this or any previous visit (from the past 240 hour(s)).   Radiological Exams on Admission: DG Chest Portable 1 View  Result Date: 03/25/2022 CLINICAL DATA:  Acute dyspnea and chest pain EXAM: PORTABLE CHEST 1 VIEW COMPARISON:  03/03/2022 FINDINGS: Cardiac shadow is stable. Aortic calcifications are again seen. Vascular congestion and mild interstitial edema is noted stable in appearance from the prior exam consistent with CHF. No focal infiltrate is noted. IMPRESSION: CHF. Electronically Signed  By: Inez Catalina M.D.   On: 03/25/2022 01:52      Assessment/Plan Principal Problem:   Fluid overload Active Problems:   ESRD on peritoneal dialysis (HCC)   Chronic combined  systolic and diastolic congestive heart failure (HCC)   CAD (coronary artery disease)   Chest pain   Myocardial injury   Anemia in ESRD (end-stage renal disease) (HCC)   Atrial fibrillation with RVR (HCC)   HTN (hypertension)   COPD (chronic obstructive pulmonary disease) (HCC)   BPH (benign prostatic hyperplasia)   Depression   Myelofibrosis (HCC)   History of seizure   Thrombocytopenia (HCC)   History of stroke   Principal Problem:   Fluid overload Active Problems:   ESRD on peritoneal dialysis (HCC)   Chronic combined systolic and diastolic congestive heart failure (HCC)   CAD (coronary artery disease)   Chest pain   Myocardial injury   Anemia in ESRD (end-stage renal disease) (HCC)   Atrial fibrillation with RVR (HCC)   HTN (hypertension)   COPD (chronic obstructive pulmonary disease) (HCC)   BPH (benign prostatic hyperplasia)   Depression   Myelofibrosis (HCC)   History of seizure   Thrombocytopenia (HCC)   History of stroke   Assessment and Plan: * Fluid overload Patient has trace leg edema, positive JVD, elevated BNP, interstitial edema by chest x-ray, clinically consistent with fluid overload.  This is due to combination of ESRD and history of CHF.  Patient is still making little urine. Consulted Dr. Holley Raring of renal for dialysis.  -Will admit to progressive unit as inpatient -Daily weights -strict I/O's -Low salt diet -Fluid restriction -Obtain REDs Vest reading -Volume management per renal dialysis -continue home lasix 80 mg daily  ESRD on peritoneal dialysis (HCC) Consulted Dr. Holley Raring for dialysis  Chronic combined systolic and diastolic congestive heart failure ([place]) 2D echo on 12/25/2021 showed EF of 30 to 35% with grade 2 diastolic dysfunction.  Now patient has fluid overload. -Continue Lasix -Volume management per renal by dialysis  CAD (coronary artery disease) CAD, chest pain and myocardial injury due to fluid overload and A-fib with RVR:  Troponin level 68, 67.  Patient recently was hospitalized due to non-STEMI, troponin was elevated to 722.  He had troponin to 98 on 03/13/2022.  His troponin is titrating down.  -Continue Plavix -As needed nitroglycerin -Trend troponin -Check A1c, FLP -Pt has been taken off Statin(s) due to severe Myalgia  Chest pain - See above  Myocardial injury - See above  Anemia in ESRD (end-stage renal disease) (HCC) Hemoglobin 8.4 (8.8 on 03/21/2022), slightly dropped -Continue iron supplement -Follow-up with CBC  Atrial fibrillation with RVR (HCC) Heart rate up to 140-150s.  Patient still taking amiodarone at home -Started amiodarone drip -Continue home oral amiodarone 200 mg daily  HTN (hypertension) Patient's blood pressure is soft -Patient is on Lasix -IV hydralazine as needed -On midodrine 5 mg daily  COPD (chronic obstructive pulmonary disease) (HCC) No wheezing or rhonchi on auscultation - Bronchodilators  BPH (benign prostatic hyperplasia) - Flomax  Depression - Continue home medications  History of stroke - Patient is on Eliquis for A-fib -Plavix  Thrombocytopenia ([place]) Platelets 126 -Follow-up with CBC  History of seizure - Seizure precaution -Continue home Keppra 500 mg daily -As needed Ativan for seizure  Myelofibrosis ([place]) Hemoglobin is close to baseline.  Platelet 126, WBC 7.5.  Per recent discharge summary from [facility], "JAK2 positive primary myelofibrosis currently off Jakafi on surveillance.  Follows closely with oncology at [facility], last seen 02/25/2022.  He was unable to tolerate Jakafi well so discontinued March 2023.  Prednisone 10 mg was continued during admission" -Continue prednisone 10 mg daily      DVT ppx: on Eliquis  Code Status: Full code  Family Communication: I offered to call his family, but patient states that he will call his wife by himself.  He does not want me to call his family.  Disposition Plan:  Anticipate discharge back  to previous environment  Consults called:  none  Admission status and Level of care: Progressive:  as inpt    Severity of Illness:  The appropriate patient status for this patient is INPATIENT. Inpatient status is judged to be reasonable and necessary in order to provide the required intensity of service to ensure the patient's safety. The patient's presenting symptoms, physical exam findings, and initial radiographic and laboratory data in the context of their chronic comorbidities is felt to place them at high risk for further clinical deterioration. Furthermore, it is not anticipated that the patient will be medically stable for discharge from the hospital within 2 midnights of admission.   * I certify that at the point of admission it is my clinical judgment that the patient will require inpatient hospital care spanning beyond 2 midnights from the point of admission due to high intensity of service, high risk for further deterioration and high frequency of surveillance required.*       Date of Service 03/25/2022    Ivor Costa [facility]   If 7PM-7AM, please contact night-coverage [email or web address] 03/25/2022, 3:50 PM

## 2022-03-26 DIAGNOSIS — Z992 Dependence on renal dialysis: Secondary | ICD-10-CM | POA: Diagnosis not present

## 2022-03-26 DIAGNOSIS — I5043 Acute on chronic combined systolic (congestive) and diastolic (congestive) heart failure: Secondary | ICD-10-CM

## 2022-03-26 DIAGNOSIS — I959 Hypotension, unspecified: Secondary | ICD-10-CM

## 2022-03-26 DIAGNOSIS — I4891 Unspecified atrial fibrillation: Secondary | ICD-10-CM | POA: Diagnosis not present

## 2022-03-26 DIAGNOSIS — D631 Anemia in chronic kidney disease: Secondary | ICD-10-CM | POA: Diagnosis not present

## 2022-03-26 DIAGNOSIS — N186 End stage renal disease: Secondary | ICD-10-CM | POA: Diagnosis not present

## 2022-03-26 LAB — BASIC METABOLIC PANEL
Anion gap: 13 (ref 5–15)
BUN: 78 mg/dL — ABNORMAL HIGH (ref 8–23)
CO2: 21 mmol/L — ABNORMAL LOW (ref 22–32)
Calcium: 8.3 mg/dL — ABNORMAL LOW (ref 8.9–10.3)
Chloride: 100 mmol/L (ref 98–111)
Creatinine, Ser: 8.13 mg/dL — ABNORMAL HIGH (ref 0.61–1.24)
GFR, Estimated: 7 mL/min — ABNORMAL LOW (ref >60)
Glucose, Bld: 89 mg/dL (ref 70–99)
Potassium: 4.4 mmol/L (ref 3.5–5.1)
Sodium: 134 mmol/L — ABNORMAL LOW (ref 135–145)

## 2022-03-26 LAB — LIPID PANEL
Cholesterol: 133 mg/dL (ref 0–200)
HDL: 30 mg/dL — ABNORMAL LOW (ref >40)
LDL Cholesterol: 82 mg/dL (ref 0–99)
Total CHOL/HDL Ratio: 4.4 RATIO
Triglycerides: 106 mg/dL (ref <150)
VLDL: 21 mg/dL (ref 0–40)

## 2022-03-26 NOTE — Progress Notes (Signed)
[facility]  ROUNDING NOTE   Subjective:  Patient well-known to [place] as we follow him for outpatient hemodialysis treatments. Has extensive cardiac work-up and was recently seen at [facility] for non-ST elevation myocardial infarction.  Update Patient appears uncomfortable today Denies shortness of breath Poor appetite remains No lower extremity edema   Objective:  Vital signs in last 24 hours:  Temp:  [97.9 F (36.6 C)-98.3 F (36.8 C)] 98.3 F (36.8 C) (07/05 1159) Pulse Rate:  [89-99] 94 (07/05 1159) Resp:  [17-35] 19 (07/05 1159) BP: (94-120)/(54-76) 104/68 (07/05 1159) SpO2:  [93 %-100 %] 94 % (07/05 1159) Weight:  [67.3 kg] 67.3 kg (07/05 0500)  Weight change: 0.3 kg Filed Weights   03/25/22 0120 03/26/22 0500  Weight: 67 kg 67.3 kg    Intake/Output: I/O last 3 completed shifts: In: 256.1 [I.V.:256.1] Out: 250 [Urine:250]   Intake/Output this shift:  Total I/O In: 120 [P.O.:120] Out: 250 [Urine:250]  Physical Exam: General: No acute distress  Head: Normocephalic, atraumatic. Moist oral mucosal membranes  Lungs:  Clear to auscultation, normal effort  Heart: S1S2 irregular  Abdomen:  Soft, nontender, bowel sounds present  Extremities: No peripheral edema.  Neurologic: Awake, alert, following commands  Skin: No acute rash  Access: Peritoneal dialysis catheter    Basic Metabolic Panel: Recent Labs  Lab 03/21/22 1140 03/25/22 0129 03/26/22 0643  NA 137 136 134*  K 4.1 3.5 4.4  CL 96 99 100  CO2 22 24 21*  GLUCOSE 146* 126* 89  BUN 44* 62* 78*  CREATININE 7.08* 7.16* 8.13*  CALCIUM 8.1* 8.0* 8.3*     Liver Function Tests: Recent Labs  Lab 03/25/22 0129  AST 12*  ALT 10  ALKPHOS 63  BILITOT 1.2  PROT 6.2*  ALBUMIN 2.7*    Recent Labs  Lab 03/25/22 0129  LIPASE 24    No results for input(s): "AMMONIA" in the last 168 hours.  CBC: Recent Labs  Lab 03/21/22 1140 03/25/22 0129  WBC 8.6 7.5  NEUTROABS 7.9* 6.8  HGB 8.8*  8.4*  HCT 27.2* 28.1*  MCV 89 92.4  PLT 188 126*     Cardiac Enzymes: No results for input(s): "CKTOTAL", "CKMB", "CKMBINDEX", "TROPONINI" in the last 168 hours.  BNP: Invalid input(s): "POCBNP"  CBG: No results for input(s): "GLUCAP" in the last 168 hours.  Microbiology: Results for orders placed or performed in visit on 01/08/22  Microscopic Examination     Status: None   Collection Time: 01/08/22  9:01 AM   Urine  Result Value Ref Range Status   WBC, UA 0-5 0 - 5 /hpf Final   RBC, Urine 0-2 0 - 2 /hpf Final   Epithelial Cells (non renal) 0-10 0 - 10 /hpf Final   Bacteria, UA None seen None seen/Few Final    Coagulation Studies: Recent Labs    03/25/22 0129  LABPROT 22.9*  INR 2.1*     Urinalysis: No results for input(s): "COLORURINE", "LABSPEC", "PHURINE", "GLUCOSEU", "HGBUR", "BILIRUBINUR", "KETONESUR", "PROTEINUR", "UROBILINOGEN", "NITRITE", "LEUKOCYTESUR" in the last 72 hours.  Invalid input(s): "APPERANCEUR"    Imaging: DG Chest Portable 1 View  Result Date: 03/25/2022 CLINICAL DATA:  Acute dyspnea and chest pain EXAM: PORTABLE CHEST 1 VIEW COMPARISON:  03/03/2022 FINDINGS: Cardiac shadow is stable. Aortic calcifications are again seen. Vascular congestion and mild interstitial edema is noted stable in appearance from the prior exam consistent with CHF. No focal infiltrate is noted. IMPRESSION: CHF. Electronically Signed   By: Inez Catalina  M.D.   On: 03/25/2022 01:52     Medications:    dialysis solution 2.5% low-MG/low-CA      allopurinol  100 mg Oral Daily   amiodarone  200 mg Oral Daily   apixaban  5 mg Oral BID   cholecalciferol  1,000 Units Oral Daily   clopidogrel  75 mg Oral Daily   DULoxetine  60 mg Oral Daily   ferrous sulfate  325 mg Oral Q breakfast   furosemide  80 mg Oral Daily   gentamicin cream  1 Application Topical Daily   levETIRAcetam  500 mg Oral QHS   midodrine  5 mg Oral TID WC   nortriptyline  25 mg Oral QHS   pantoprazole   20 mg Oral Daily   predniSONE  10 mg Oral Q breakfast   tamsulosin  0.8 mg Oral Daily   acetaminophen, albuterol, dextromethorphan-guaiFENesin, hydrALAZINE, HYDROmorphone (DILAUDID) injection, LORazepam, meclizine, nitroGLYCERIN, ondansetron (ZOFRAN) IV  Assessment/ Plan:  66 y.o. male with past medical history of ESRD on PD, coronary artery disease status post multiple stent placement, hypertension, hyperlipidemia who presented with shortness of breath and found to be in atrial fibrillation with RVR.  1.  ESRD on PD.  We will resume normal PD schedule.  Fill volumes 2.5 L, 6 exchanges, 2.5% dextrose solution to be used given volume overload. Will provide nightly treatment during this admission   2.  Atrial fibrillation with rapid ventricular response.  Continue current treatment with amiodarone and Eliquis.  3.  Anemia of chronic kidney disease.  Winfield Rast as an outpatient.  Monitor hemoglobin over the course of the hospitalization. Hgb 8.4 yesterday  4.  Hypotension.  Maintain the patient on midodrine. Blood pressure 104/68   LOS: 1 Acire Tang 7/5/202312:32 PM

## 2022-03-26 NOTE — Progress Notes (Signed)
[facility] Progress Note  Patient: MAZE CORNIEL    GDJ:[id number]  DOA: 03/25/2022    Date of Service: the patient was seen and examined on 03/26/2022  Brief hospital course: 66 year old male with past medical history of end-stage renal disease on peritoneal dialysis, CAD, COPD, systolic heart failure with an ejection fraction of 30 to 35%, atrial fibrillation on Eliquis, seizure disorder and myelofibrosis with recent hospitalization at [facility] for a non-STEMI status post stent placement on 6/16 who developed shortness of breath on 7/3 which progressively became worse and he presented to the emergency room on 7/4.  Patient found to have atrial fibrillation with rapid ventricular rate.  Patient also found to be volume overloaded.  Nephrology consulted and patient was started on amiodarone drip.  Assessment and Plan: Assessment and Plan: Acute on chronic combined systolic and diastolic CHF (congestive heart failure) ([place]) 2D echo on 12/25/2021 showed EF of 30 to 35% with grade 2 diastolic dysfunction.  On Lasix plus peritoneal dialysis.  Chronic combined systolic and diastolic congestive heart failure ([place]) 2D echo on 12/25/2021 showed EF of 30 to 35% with grade 2 diastolic dysfunction.  Now patient has fluid overload. -Continue Lasix -Volume management per renal by dialysis  Atrial fibrillation with RVR (HCC) Heart rate initially in the 140s to 150s.  Started on amiodarone drip.  Since then, heart rate now below 100 and drip discontinued.  Patient resumed on normal home dose of p.o. amiodarone  ESRD on peritoneal dialysis (HCC) Consulted Dr. Holley Raring for dialysis  CAD (coronary artery disease) CAD, chest pain and myocardial injury due to fluid overload and A-fib with RVR: Troponin level 68, 67.  Patient recently was hospitalized due to non-STEMI, troponin was elevated to 722.  He had troponin to 98 on 03/13/2022.  His troponin is titrating down.  -Continue Plavix -As needed  nitroglycerin -Trend troponin -Check A1c, FLP -Pt has been taken off Statin(s) due to severe Myalgia  Chest pain - See above  Anemia in ESRD (end-stage renal disease) (HCC) Hemoglobin 8.4 (8.8 on 03/21/2022), slightly dropped -Continue iron supplement -Follow-up with CBC  HTN (hypertension) Patient's blood pressure is soft -Patient is on Lasix -IV hydralazine as needed -On midodrine 5 mg daily  COPD (chronic obstructive pulmonary disease) (HCC) No wheezing or rhonchi on auscultation - Bronchodilators  BPH (benign prostatic hyperplasia) - Flomax  Depression - Continue home medications  Hypotension On chronic midodrine  History of stroke - Patient is on Eliquis for A-fib -Plavix  Thrombocytopenia ([place]) Platelets 126 -Follow-up with CBC  History of seizure - Seizure precaution -Continue home Keppra 500 mg daily -As needed Ativan for seizure  Myelofibrosis ([place]) Hemoglobin is close to baseline.  Platelet 126, WBC 7.5.  Per recent discharge summary from [facility], "JAK2 positive primary myelofibrosis currently off Jakafi on surveillance.  Follows closely with oncology at [facility], last seen 02/25/2022.  He was unable to tolerate Jakafi well so discontinued March 2023.  Prednisone 10 mg was continued during admission" -Continue prednisone 10 mg daily       Body mass index is 23.24 kg/m.        Consultants: Nephrology  Procedures: Peritoneal dialysis  Antimicrobials: None  Code Status: Full code   Subjective: Patient feeling better, a little less short of breath  Objective: Vital signs were reviewed and unremarkable. Vitals:   03/26/22 0711 03/26/22 1159  BP: 102/61 104/68  Pulse: 95 94  Resp: 18 19  Temp: 98.2 F (36.8 C) 98.3 F (36.8 C)  SpO2: 93% 94%    Intake/Output Summary (Last 24 hours) at 03/26/2022 1302 Last data filed at 03/26/2022 0900 Gross per 24 hour  Intake 376.13 ml  Output 500 ml  Net -123.87 ml   Filed Weights   03/25/22  0120 03/26/22 0500  Weight: 67 kg 67.3 kg   Body mass index is 23.24 kg/m.  Exam:  General: Alert and oriented x3, no acute distress HEENT: Normocephalic, atraumatic, mucous membranes are moist Cardiovascular: Irregular rhythm, rate controlled Respiratory: Decreased breath sounds bibasilar Abdomen: Soft, nontender, nondistended, positive bowel sounds Musculoskeletal: No clubbing or cyanosis, trace pitting edema Skin: No skin breaks, tears or lesions Psychiatry: Appropriate, no evidence of psychoses Neurology: No focal deficits  Data Reviewed: There are no new results to review at this time.  Disposition:  Status is: Inpatient Remains inpatient appropriate because: Continued management of volumes    Anticipated discharge date: 7/6    Family Communication: Left message for wife DVT Prophylaxis:  apixaban Arne [place]) tablet 5 mg    Author: Annita Brod ,MD 03/26/2022 1:02 PM  To reach On-call, see care teams to locate the attending and reach out via [email or web address]. Between 7PM-7AM, please contact night-coverage If you still have difficulty reaching the attending provider, please page the [facility] (Director on Call) for [facility] on amion for assistance.

## 2022-03-26 NOTE — Assessment & Plan Note (Signed)
2D echo on 12/25/2021 showed EF of 30 to 35% with grade 2 diastolic dysfunction.  Received Lasix plus peritoneal dialysis.  Also received Lasix after blood transfusion.

## 2022-03-26 NOTE — Progress Notes (Signed)
PD exit clean, dry and intact. New dressing done. PD treatment initiated. Cycler went into first fill without any issue.

## 2022-03-26 NOTE — Hospital Course (Signed)
66 year old male with past medical history of end-stage renal disease on peritoneal dialysis, CAD, COPD, systolic heart failure with an ejection fraction of 30 to 35%, atrial fibrillation on Eliquis, seizure disorder and myelofibrosis with recent hospitalization at [facility] for a non-STEMI status post stent placement on 6/16 who developed shortness of breath on 7/3 which progressively became worse and he presented to the emergency room on 7/4.  Patient found to have atrial fibrillation with rapid ventricular rate.  Patient also found to be volume overloaded.  Nephrology consulted and patient was started on amiodarone drip.  7/6, patient heart rate under control.  Patient able to ambulate on room air.  Patient's hemoglobin had dropped to 7.0, down from 8.4 two days prior.  Discussed with patient and given recent non-STEMI, best to keep his hemoglobin above 8.  Ordered 1 unit packed red blood cells which patient was transfused on afternoon of 7/6.  Post transfusion, patient given 1 dose of Lasix.

## 2022-03-26 NOTE — Assessment & Plan Note (Signed)
On chronic midodrine

## 2022-03-26 NOTE — TOC Initial Note (Signed)
Transition of Care [facility]) - Initial/Assessment Note    Patient Details  Name: James Arnold MRN: [id number] Date of Birth: 1956/09/06  Transition of Care [facility]) CM/SW Contact:    Laurena Slimmer, RN Phone Number: 03/26/2022, 4:30 PM  Clinical Narrative:                  Transition of Care [facility]) Screening Note   Patient Details  Name: James Arnold Date of Birth: Dec 06, 1955   Transition of Care [facility]) CM/SW Contact:    Laurena Slimmer, RN Phone Number: 03/26/2022, 4:30 PM    Transition of Care Department [facility]) has reviewed patient and no TOC needs have been identified at this time. We will continue to monitor patient advancement through interdisciplinary progression rounds. If new patient transition needs arise, please place a TOC consult.          Patient Goals and CMS Choice        Expected Discharge Plan and Services                                                Prior Living Arrangements/Services                       Activities of Daily Living Home Assistive Devices/Equipment: None ADL Screening (condition at time of admission) Patient's cognitive ability adequate to safely complete daily activities?: Yes Is the patient deaf or have difficulty hearing?: No Does the patient have difficulty seeing, even when wearing glasses/contacts?: No Does the patient have difficulty concentrating, remembering, or making decisions?: No Patient able to express need for assistance with ADLs?: Yes Does the patient have difficulty dressing or bathing?: No Independently performs ADLs?: Yes (appropriate for developmental age) Does the patient have difficulty walking or climbing stairs?: No Weakness of Legs: None Weakness of Arms/Hands: None  Permission Sought/Granted                  Emotional Assessment              Admission diagnosis:  Fluid overload [E87.70] Peritoneal dialysis catheter in place [facility]) [Z99.2] Chronic renal failure, stage 5  ([place]) [N18.5] Acute on chronic congestive heart failure, unspecified heart failure type ([place]) [I50.9] Patient Active Problem List   Diagnosis Date Noted   Hypotension 03/26/2022   HFrEF (heart failure with reduced ejection fraction) ([place]) 03/24/2022   NSTEMI (non-ST elevated myocardial infarction) ([place]) 03/24/2022   S/P drug eluting coronary stent placement 03/24/2022   Atrial fibrillation with RVR ([place]) 03/24/2022   Palliative care encounter    SOB (shortness of breath) 03/02/2022   Fall 01/23/2022   Lumbar pain 01/23/2022   QT prolongation 01/23/2022   Nonrheumatic aortic valve stenosis 12/31/2021   Dyspnea 12/23/2021   Acute on chronic combined systolic and diastolic CHF (congestive heart failure) ([place]) 12/22/2021   Chronic HFrEF (heart failure with reduced ejection fraction) ([place]) 10/10/2021   Chronic anemia 10/10/2021   COPD (chronic obstructive pulmonary disease) ([place]) 09/13/2021   History of non-ST elevation myocardial infarction (NSTEMI) 09/11/2021   Aggression 09/06/2021   Migraine without aura and without status migrainosus, not intractable 09/06/2021   History of stroke 03/14/2021   Aortic atherosclerosis ([place]) 12/12/2020   Paralytic syndrome, non-dominant side, post-stroke ([place]) 12/10/2020   Anemia due to vitamin B12 deficiency    Anemia of  chronic disease    Urinary retention    History of seizure 11/20/2020   Thrombocytopenia ([place]) 11/20/2020   ESRD (end stage renal disease) ([place]) 11/20/2020   Leukocytosis 11/20/2020   ESRD on peritoneal dialysis ([place]) 11/20/2020   Status post shoulder replacement    Rotator cuff arthropathy 11/19/2020   Hypertensive heart and chronic kidney disease with heart failure and with stage 5 chronic kidney disease, or end stage renal disease ([place]) 07/10/2020   Dialysis patient ([place]) 01/09/2020   Senile purpura ([place]) 01/09/2020   Centrilobular emphysema ([place]) 09/13/2019   Secondary hyperparathyroidism ([place]) 03/14/2019   BPH (benign  prostatic hyperplasia) 10/24/2018   Depression 10/24/2018   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   Anemia of chronic kidney failure, stage 5 ([place]) 12/30/2017   Chest pain 12/29/2017   Proteinuria 11/25/2017   Gout 08/27/2017   Myelofibrosis ([place])    Major depression, recurrent (HCC)    Benign hypertensive renal disease    CKD (chronic kidney disease) stage 5, GFR less than 15 ml/min (HCC)    HTN (hypertension) 2010   Chronic combined systolic and diastolic congestive heart failure ([place]) 2010   Anemia in ESRD (end-stage renal disease) ([place]) 2010   Hyperlipidemia LDL goal <70 10/23/2002   CAD (coronary artery disease) 08/22/1996   PCP:  Valerie Roys, DO Pharmacy:   [facility] - [place], [place] [address] Phone: [phone] Fax: [phone]  [facility] [address], [place] - [address] [place] [address] Phone: [phone] Fax: [phone]     Social Determinants of Health (SDOH) Interventions    Readmission Risk Interventions     No data to display

## 2022-03-27 ENCOUNTER — Telehealth: Payer: Self-pay | Admitting: Internal Medicine

## 2022-03-27 DIAGNOSIS — I4891 Unspecified atrial fibrillation: Secondary | ICD-10-CM | POA: Diagnosis not present

## 2022-03-27 DIAGNOSIS — D7581 Myelofibrosis: Secondary | ICD-10-CM | POA: Diagnosis not present

## 2022-03-27 DIAGNOSIS — I5043 Acute on chronic combined systolic (congestive) and diastolic (congestive) heart failure: Secondary | ICD-10-CM | POA: Diagnosis not present

## 2022-03-27 DIAGNOSIS — N186 End stage renal disease: Secondary | ICD-10-CM | POA: Diagnosis not present

## 2022-03-27 DIAGNOSIS — Z992 Dependence on renal dialysis: Secondary | ICD-10-CM | POA: Diagnosis not present

## 2022-03-27 LAB — CBC
HCT: 22.3 % — ABNORMAL LOW (ref 39.0–52.0)
Hemoglobin: 7 g/dL — ABNORMAL LOW (ref 13.0–17.0)
MCH: 27.3 pg (ref 26.0–34.0)
MCHC: 31.4 g/dL (ref 30.0–36.0)
MCV: 87.1 fL (ref 80.0–100.0)
Platelets: 107 10*3/uL — ABNORMAL LOW (ref 150–400)
RBC: 2.56 MIL/uL — ABNORMAL LOW (ref 4.22–5.81)
RDW: 17.1 % — ABNORMAL HIGH (ref 11.5–15.5)
WBC: 4 10*3/uL (ref 4.0–10.5)
nRBC: 0 % (ref 0.0–0.2)

## 2022-03-27 LAB — BASIC METABOLIC PANEL
Anion gap: 12 (ref 5–15)
BUN: 74 mg/dL — ABNORMAL HIGH (ref 8–23)
CO2: 23 mmol/L (ref 22–32)
Calcium: 8.3 mg/dL — ABNORMAL LOW (ref 8.9–10.3)
Chloride: 98 mmol/L (ref 98–111)
Creatinine, Ser: 7.61 mg/dL — ABNORMAL HIGH (ref 0.61–1.24)
GFR, Estimated: 7 mL/min — ABNORMAL LOW (ref >60)
Glucose, Bld: 105 mg/dL — ABNORMAL HIGH (ref 70–99)
Potassium: 3.4 mmol/L — ABNORMAL LOW (ref 3.5–5.1)
Sodium: 133 mmol/L — ABNORMAL LOW (ref 135–145)

## 2022-03-27 LAB — PREPARE RBC (CROSSMATCH)

## 2022-03-27 MED ORDER — APIXABAN 5 MG PO TABS: 5.0000 mg | ORAL_TABLET | Freq: Two times a day (BID) | ORAL | 1 refills | Status: AC

## 2022-03-27 MED ORDER — PREDNISONE 10 MG PO TABS: 10.0000 mg | ORAL_TABLET | Freq: Every day | ORAL | 1 refills | Status: AC

## 2022-03-27 MED ORDER — SODIUM CHLORIDE 0.9% IV SOLUTION: Freq: Once | INTRAVENOUS | Status: AC

## 2022-03-27 MED ORDER — FUROSEMIDE 10 MG/ML IJ SOLN
20.0000 mg | Freq: Once | INTRAMUSCULAR | Status: AC
  Administered 2022-03-27: 20 mg via INTRAVENOUS
  Filled 2022-03-27: qty 2

## 2022-03-27 NOTE — Plan of Care (Signed)
  Problem: Clinical Measurements: Goal: Will remain free from infection Outcome: Progressing   Problem: Clinical Measurements: Goal: Respiratory complications will improve Outcome: Progressing   

## 2022-03-27 NOTE — Progress Notes (Signed)
[facility]  ROUNDING NOTE   Subjective:  Patient well-known to [place] as we follow him for outpatient hemodialysis treatments. Has extensive cardiac work-up and was recently seen at [facility] for non-ST elevation myocardial infarction.  Update Patient appears well this morning States PD treatment went well overnight Denies chest pain or discomfort today Appetite remains poor, this was an issue prior to admission.  Objective:  Vital signs in last 24 hours:  Temp:  [97.6 F (36.4 C)-98.7 F (37.1 C)] 97.9 F (36.6 C) (07/06 1121) Pulse Rate:  [86-94] 88 (07/06 1121) Resp:  [16-20] 18 (07/06 1121) BP: (95-123)/(58-73) 103/58 (07/06 1121) SpO2:  [93 %-97 %] 94 % (07/06 1121) Weight:  [68.4 kg-69.6 kg] 68.4 kg (07/06 0938)  Weight change: 2.3 kg Filed Weights   03/26/22 1854 03/27/22 0519 03/27/22 0938  Weight: 69.6 kg 69.4 kg 68.4 kg    Intake/Output: I/O last 3 completed shifts: In: 840 [P.O.:840] Out: 2750 [Urine:2750]   Intake/Output this shift:  Total I/O In: 240 [P.O.:240] Out: -   Physical Exam: General: No acute distress  Head: Normocephalic, atraumatic. Moist oral mucosal membranes  Lungs:  Clear to auscultation, normal effort  Heart: S1S2 irregular  Abdomen:  Soft, nontender, bowel sounds present  Extremities: No peripheral edema.  Neurologic: Awake, alert, following commands  Skin: No acute rash  Access: Peritoneal dialysis catheter    Basic Metabolic Panel: Recent Labs  Lab 03/21/22 1140 03/25/22 0129 03/26/22 0643 03/27/22 0600  NA 137 136 134* 133*  K 4.1 3.5 4.4 3.4*  CL 96 99 100 98  CO2 22 24 21* 23  GLUCOSE 146* 126* 89 105*  BUN 44* 62* 78* 74*  CREATININE 7.08* 7.16* 8.13* 7.61*  CALCIUM 8.1* 8.0* 8.3* 8.3*     Liver Function Tests: Recent Labs  Lab 03/25/22 0129  AST 12*  ALT 10  ALKPHOS 63  BILITOT 1.2  PROT 6.2*  ALBUMIN 2.7*    Recent Labs  Lab 03/25/22 0129  LIPASE 24    No results for input(s):  "AMMONIA" in the last 168 hours.  CBC: Recent Labs  Lab 03/21/22 1140 03/25/22 0129 03/27/22 0600  WBC 8.6 7.5 4.0  NEUTROABS 7.9* 6.8  --   HGB 8.8* 8.4* 7.0*  HCT 27.2* 28.1* 22.3*  MCV 89 92.4 87.1  PLT 188 126* 107*     Cardiac Enzymes: No results for input(s): "CKTOTAL", "CKMB", "CKMBINDEX", "TROPONINI" in the last 168 hours.  BNP: Invalid input(s): "POCBNP"  CBG: No results for input(s): "GLUCAP" in the last 168 hours.  Microbiology: Results for orders placed or performed in visit on 01/08/22  Microscopic Examination     Status: None   Collection Time: 01/08/22  9:01 AM   Urine  Result Value Ref Range Status   WBC, UA 0-5 0 - 5 /hpf Final   RBC, Urine 0-2 0 - 2 /hpf Final   Epithelial Cells (non renal) 0-10 0 - 10 /hpf Final   Bacteria, UA None seen None seen/Few Final    Coagulation Studies: Recent Labs    03/25/22 0129  LABPROT 22.9*  INR 2.1*     Urinalysis: No results for input(s): "COLORURINE", "LABSPEC", "PHURINE", "GLUCOSEU", "HGBUR", "BILIRUBINUR", "KETONESUR", "PROTEINUR", "UROBILINOGEN", "NITRITE", "LEUKOCYTESUR" in the last 72 hours.  Invalid input(s): "APPERANCEUR"    Imaging: No results found.   Medications:    dialysis solution 2.5% low-MG/low-CA      allopurinol  100 mg Oral Daily   amiodarone  200 mg Oral Daily  apixaban  5 mg Oral BID   cholecalciferol  1,000 Units Oral Daily   clopidogrel  75 mg Oral Daily   DULoxetine  60 mg Oral Daily   ferrous sulfate  325 mg Oral Q breakfast   furosemide  80 mg Oral Daily   gentamicin cream  1 Application Topical Daily   levETIRAcetam  500 mg Oral QHS   midodrine  5 mg Oral TID WC   nortriptyline  25 mg Oral QHS   pantoprazole  20 mg Oral Daily   predniSONE  10 mg Oral Q breakfast   tamsulosin  0.8 mg Oral Daily   acetaminophen, albuterol, dextromethorphan-guaiFENesin, hydrALAZINE, HYDROmorphone (DILAUDID) injection, LORazepam, meclizine, nitroGLYCERIN, ondansetron (ZOFRAN)  IV  Assessment/ Plan:  66 y.o. male with past medical history of ESRD on PD, coronary artery disease status post multiple stent placement, hypertension, hyperlipidemia who presented with shortness of breath and found to be in atrial fibrillation with RVR.  1.  ESRD on PD.  We will resume normal PD schedule.  Fill volumes 2.5 L, 6 exchanges, 2.5% dextrose solution to be used given volume overload. Received treatment overnight, no concerns. UF 1242m achieved. Also recorded 2.5L in urine output.   2.  Atrial fibrillation with rapid ventricular response.  Continue current treatment with amiodarone and Eliquis.  3.  Anemia of chronic kidney disease.  RWinfield Rastas an outpatient.   Hgb 7.0 today. Will defer need of blood transfusion to primary team.  4.  Hypotension.  Maintain the patient on midodrine. Blood pressure currently 103/58   LOS: 2 Finlee Concepcion 7/6/202311:43 AM

## 2022-03-27 NOTE — Discharge Summary (Addendum)
Physician Discharge Summary   Patient: James Arnold MRN: [id number] DOB: 07/14/1956  Admit date:     03/25/2022  Anticipated discharge date: 03/28/2022  Discharge Physician: Annita Brod   PCP: Valerie Roys, DO   Recommendations at discharge:   Patient given new prescription for prednisone 10 mg p.o. daily (he has been previously on this medication for his myelofibrosis) Patient given new prescription for Eliquis 5 mg p.o. twice daily, although it appears that he has previously also been on this medication.  Discharge Diagnoses: Active Problems:   Acute on chronic combined systolic and diastolic CHF (congestive heart failure) (HCC)   Atrial fibrillation with RVR (HCC)   ESRD on peritoneal dialysis (HCC)   CAD (coronary artery disease)   Chest pain   Anemia in ESRD (end-stage renal disease) (HCC)   HTN (hypertension)   COPD (chronic obstructive pulmonary disease) (HCC)   BPH (benign prostatic hyperplasia)   Depression   Myelofibrosis (HCC)   History of seizure   Thrombocytopenia (HCC)   History of stroke   Hypotension  Resolved Problems:   * No resolved hospital problems. *  Hospital Course: 66 year old male with past medical history of end-stage renal disease on peritoneal dialysis, CAD, COPD, systolic heart failure with an ejection fraction of 30 to 35%, atrial fibrillation on Eliquis, seizure disorder and myelofibrosis with recent hospitalization at [facility] for a non-STEMI status post stent placement on 6/16 who developed shortness of breath on 7/3 which progressively became worse and he presented to the emergency room on 7/4.  Patient found to have atrial fibrillation with rapid ventricular rate.  Patient also found to be volume overloaded.  Nephrology consulted and patient was started on amiodarone drip.  7/6, patient heart rate under control.  Patient able to ambulate on room air.  Patient's hemoglobin had dropped to 7.0, down from 8.4 two days prior.  Discussed  with patient and given recent non-STEMI, best to keep his hemoglobin above 8.  Ordered 1 unit packed red blood cells which patient was transfused on afternoon of 7/6.  Post transfusion, patient given 1 dose of Lasix.  Assessment and Plan: Acute on chronic combined systolic and diastolic CHF (congestive heart failure) ([place]) 2D echo on 12/25/2021 showed EF of 30 to 35% with grade 2 diastolic dysfunction.  Received Lasix plus peritoneal dialysis.  Also received Lasix after blood transfusion.  Chronic combined systolic and diastolic congestive heart failure ([place]) 2D echo on 12/25/2021 showed EF of 30 to 35% with grade 2 diastolic dysfunction.  Now patient has fluid overload. -Continue Lasix -Volume management per renal by dialysis  Atrial fibrillation with RVR (HCC) Heart rate initially in the 140s to 150s.  Started on amiodarone drip.  Since then, heart rate now below 100 and drip discontinued.  Patient resumed on normal home dose of p.o. amiodarone  ESRD on peritoneal dialysis [facility]) Nephrology consulted and patient received peritoneal dialysis on 7/5.  CAD (coronary artery disease) CAD, chest pain and myocardial injury due to fluid overload and A-fib with RVR: Troponin level 68, 67.  Patient recently was hospitalized due to non-STEMI, troponin was elevated to 722.  He had troponin to 98 on 03/13/2022.  His troponin is titrating down.  -Continue Plavix -As needed nitroglycerin -Trend troponin -Check A1c, FLP -Pt has been taken off Statin(s) due to severe Myalgia  Chest pain - See above  Anemia in ESRD (end-stage renal disease) (HCC) Hemoglobin initially 8.4 (8.8 on 03/21/2022), however CBC on 7/6 noted hemoglobin of  7.0.  No evidence of active bleeding.  Transfused 1 unit packed red blood cells.  Patient given Lasix afterwards.  Given his cardiac issues, best to keep his hemoglobin above 8.0.  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  Platelet 126, WBC 7.5.  Per recent discharge summary from 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  Follows closely with oncology at [facility], last seen 02/25/2022.  He was unable to tolerate Jakafi well so discontinued March 2023.  Prednisone 10 mg was continued during admission" -Continue prednisone 10 mg daily         Consultants: Nephrology Procedures performed: Peritoneal dialysis, 1 unit packed red cell transfusion Disposition: Home Diet recommendation:  Renal diet DISCHARGE MEDICATION: Allergies as of 03/27/2022       Reactions   Losartan Shortness Of Breath        Medication List     STOP taking these medications    ferrous sulfate 325 (65 FE) MG tablet       TAKE these medications    acetaminophen 500 MG tablet Commonly known as: TYLENOL Take 500 mg by mouth every 6 (six) hours as needed for headache, fever, moderate pain or mild pain.   albuterol 108 (90 Base) MCG/ACT inhaler Commonly known as: VENTOLIN HFA Inhale 2 puffs into the lungs every 6 (six) hours as needed for wheezing or shortness of breath.   albuterol (2.5 MG/3ML) 0.083% nebulizer solution Commonly known as: PROVENTIL Take 3 mLs (2.5 mg total) by nebulization every 6 (six) hours as needed for wheezing or shortness of breath.   allopurinol 100 MG tablet Commonly known as: ZYLOPRIM Take 100 mg by  mouth daily.   amiodarone 200 MG tablet Commonly known as: PACERONE Take 200 mg by mouth daily.   apixaban 5 MG Tabs tablet Commonly known as: ELIQUIS Take 1 tablet (5 mg total) by mouth 2 (two) times daily.   clopidogrel 75 MG tablet Commonly known as: PLAVIX Take 75 mg by mouth daily.   DULoxetine 60 MG capsule Commonly known as: Cymbalta Take 1 capsule (60 mg total) by mouth daily.   furosemide 80 MG tablet Commonly known as: LASIX Take 1 tablet (80 mg total) by mouth daily. Increased from 20 mg daily.   gentamicin cream 0.1 % Commonly known as: GARAMYCIN Apply 1 application topically every other day.   levETIRAcetam 500 MG tablet Commonly known as: KEPPRA Take 1 tablet (500 mg total) by mouth at bedtime.   meclizine 12.5 MG tablet Commonly known as: ANTIVERT Take 1 tablet (12.5 mg total) by mouth 2 (two) times daily as needed for dizziness.   midodrine 5 MG tablet Commonly known as: PROAMATINE Take 1 tablet (5 mg total) by mouth 3 (three) times daily with meals.   nitroGLYCERIN 0.4 MG SL tablet Commonly known as: NITROSTAT Place 1 tablet (0.4 mg total) under the tongue every 5 (five) minutes as needed for chest pain.   nortriptyline 25  MG capsule Commonly known as: Pamelor Take 1 capsule (25 mg total) by mouth at bedtime.   pantoprazole 40 MG tablet Commonly known as: PROTONIX Take 40 mg by mouth daily.   predniSONE 10 MG tablet Commonly known as: DELTASONE Take 1 tablet (10 mg total) by mouth daily with breakfast. Start taking on: March 28, 2022   tamsulosin 0.4 MG Caps capsule Commonly known as: FLOMAX Take 2 capsules (0.8 mg total) by mouth daily.   Vitamin D-1000 Max St 25 MCG (1000 UT) tablet Generic drug: Cholecalciferol Take 1,000 Units by mouth daily.        Discharge Exam: Filed Weights   03/26/22 1854 03/27/22 0519 03/27/22 0938  Weight: 69.6 kg 69.4 kg 68.4 kg   General: Alert and oriented x3, no acute distress Cardiovascular:  Irregular rhythm, rate controlled Lungs: Clear to auscultation bilaterally  Condition at discharge: good  The results of significant diagnostics from this hospitalization (including imaging, microbiology, ancillary and laboratory) are listed below for reference.   Imaging Studies: DG Chest Portable 1 View  Result Date: 03/25/2022 CLINICAL DATA:  Acute dyspnea and chest pain EXAM: PORTABLE CHEST 1 VIEW COMPARISON:  03/03/2022 FINDINGS: Cardiac shadow is stable. Aortic calcifications are again seen. Vascular congestion and mild interstitial edema is noted stable in appearance from the prior exam consistent with CHF. No focal infiltrate is noted. IMPRESSION: CHF. Electronically Signed   By: Inez Catalina M.D.   On: 03/25/2022 01:52   DG Chest Port 1 View  Result Date: 03/03/2022 CLINICAL DATA:  Tachypnea EXAM: PORTABLE CHEST 1 VIEW COMPARISON:  03/02/2022 FINDINGS: Diffuse interstitial prominence throughout the lungs. Heart is borderline in size. No effusions. No acute bony abnormality. IMPRESSION: Diffuse interstitial prominence, likely edema/CHF. Electronically Signed   By: Rolm Baptise M.D.   On: 03/03/2022 21:07   DG Chest Port 1 View  Result Date: 03/02/2022 CLINICAL DATA:  Shortness of breath. EXAM: PORTABLE CHEST 1 VIEW COMPARISON:  Chest radiograph dated 12/22/2021 and CT dated 12/22/2021. FINDINGS: There is vascular congestion. Diffuse interstitial and interlobular septal prominence consistent with edema. Superimposed pneumonia is not excluded. No large pleural effusion. No pneumothorax. The cardiac silhouette is within limits. Atherosclerotic calcification of the aorta. Left shoulder arthroplasty. Degenerative changes of the right shoulder. No acute osseous pathology. IMPRESSION: Congestive heart failure.  Superimposed pneumonia is not excluded. Electronically Signed   By: Anner Crete M.D.   On: 03/02/2022 21:43    Microbiology: Results for orders placed or performed in visit on  01/08/22  Microscopic Examination     Status: None   Collection Time: 01/08/22  9:01 AM   Urine  Result Value Ref Range Status   WBC, UA 0-5 0 - 5 /hpf Final   RBC, Urine 0-2 0 - 2 /hpf Final   Epithelial Cells (non renal) 0-10 0 - 10 /hpf Final   Bacteria, UA None seen None seen/Few Final    Labs: CBC: Recent Labs  Lab 03/21/22 1140 03/25/22 0129 03/27/22 0600  WBC 8.6 7.5 4.0  NEUTROABS 7.9* 6.8  --   HGB 8.8* 8.4* 7.0*  HCT 27.2* 28.1* 22.3*  MCV 89 92.4 87.1  PLT 188 126* 564*   Basic Metabolic Panel: Recent Labs  Lab 03/21/22 1140 03/25/22 0129 03/26/22 0643 03/27/22 0600  NA 137 136 134* 133*  K 4.1 3.5 4.4 3.4*  CL 96 99 100 98  CO2 22 24 21* 23  GLUCOSE 146* 126* 89 105*  BUN 44* 62* 78* 74*  CREATININE  7.08* 7.16* 8.13* 7.61*  CALCIUM 8.1* 8.0* 8.3* 8.3*   Liver Function Tests: Recent Labs  Lab 03/25/22 0129  AST 12*  ALT 10  ALKPHOS 63  BILITOT 1.2  PROT 6.2*  ALBUMIN 2.7*   CBG: No results for input(s): "GLUCAP" in the last 168 hours.  Discharge time spent: Greater than 30 minutes.  Signed: Annita Brod, MD [facility] 03/27/2022

## 2022-03-27 NOTE — Telephone Encounter (Signed)
Pt's wife is calling to let Dr. Saunders Revel know that patient is in [facility], due to needing a blood transfusion. She states there was really no reason to keep him and she states that he has not received proper care while there. She also states that he will be keeping his appt at 8:25 a.m. 07/07 with Cadence Kathlen Mody, PA, one way or another he will be there.

## 2022-03-27 NOTE — Progress Notes (Signed)
Patient was alert and oriented this shift. Calm and cooperative. Vital signs were stable at baseline. Patient is no longer on Nexterone. Eliquis given with night meds. 2 Peripheral IVs present in Right AC and Right Forearm.

## 2022-03-28 ENCOUNTER — Encounter: Payer: Self-pay | Admitting: Medical

## 2022-03-28 ENCOUNTER — Ambulatory Visit (INDEPENDENT_AMBULATORY_CARE_PROVIDER_SITE_OTHER): Payer: Medicare HMO | Admitting: Medical

## 2022-03-28 ENCOUNTER — Encounter: Payer: Self-pay | Admitting: Family Medicine

## 2022-03-28 ENCOUNTER — Telehealth: Payer: Self-pay

## 2022-03-28 VITALS — BP 110/58 | HR 90 | Ht 67.0 in | Wt 151.2 lb

## 2022-03-28 DIAGNOSIS — I69369 Other paralytic syndrome following cerebral infarction affecting unspecified side: Secondary | ICD-10-CM

## 2022-03-28 DIAGNOSIS — I2510 Atherosclerotic heart disease of native coronary artery without angina pectoris: Secondary | ICD-10-CM

## 2022-03-28 DIAGNOSIS — I5042 Chronic combined systolic (congestive) and diastolic (congestive) heart failure: Secondary | ICD-10-CM | POA: Diagnosis not present

## 2022-03-28 DIAGNOSIS — I050 Rheumatic mitral stenosis: Secondary | ICD-10-CM

## 2022-03-28 DIAGNOSIS — Z992 Dependence on renal dialysis: Secondary | ICD-10-CM | POA: Diagnosis not present

## 2022-03-28 DIAGNOSIS — I214 Non-ST elevation (NSTEMI) myocardial infarction: Secondary | ICD-10-CM

## 2022-03-28 DIAGNOSIS — I959 Hypotension, unspecified: Secondary | ICD-10-CM

## 2022-03-28 DIAGNOSIS — N186 End stage renal disease: Secondary | ICD-10-CM | POA: Diagnosis not present

## 2022-03-28 DIAGNOSIS — I350 Nonrheumatic aortic (valve) stenosis: Secondary | ICD-10-CM

## 2022-03-28 DIAGNOSIS — I480 Paroxysmal atrial fibrillation: Secondary | ICD-10-CM

## 2022-03-28 DIAGNOSIS — I5022 Chronic systolic (congestive) heart failure: Secondary | ICD-10-CM | POA: Diagnosis not present

## 2022-03-28 DIAGNOSIS — I700 Atherosclerosis of aorta: Secondary | ICD-10-CM

## 2022-03-28 DIAGNOSIS — G43009 Migraine without aura, not intractable, without status migrainosus: Secondary | ICD-10-CM

## 2022-03-28 LAB — TYPE AND SCREEN
ABO/RH(D): A POS
Antibody Screen: NEGATIVE
Unit division: 0

## 2022-03-28 LAB — BPAM RBC
Blood Product Expiration Date: 202307272359
ISSUE DATE / TIME: 202307061729
Unit Type and Rh: 6200

## 2022-03-28 MED ORDER — MIDODRINE HCL 5 MG PO TABS: 5.0000 mg | ORAL_TABLET | Freq: Three times a day (TID) | ORAL | 0 refills | Status: DC

## 2022-03-28 NOTE — Telephone Encounter (Signed)
Pt seen today in office with Cadence Furth, PA-C.

## 2022-03-28 NOTE — Patient Instructions (Addendum)
Medication Instructions:  Your physician has recommended you make the following change in your medication:   STOP Midodrine   *If you need a refill on your cardiac medications before your next appointment, please call your pharmacy*   Lab Work: None ordered If you have labs (blood work) drawn today and your tests are completely normal, you will receive your results only by: [place] (if you have MyChart) OR A paper copy in the mail If you have any lab test that is abnormal or we need to change your treatment, we will call you to review the results.   Testing/Procedures: None ordered   Follow-Up: At [facility], you and your health needs are our priority.  As part of our continuing mission to provide you with exceptional heart care, we have created designated Provider Care Teams.  These Care Teams include your primary Cardiologist (physician) and Advanced Practice Providers (APPs -  Physician Assistants and Nurse Practitioners) who all work together to provide you with the care you need, when you need it.  We recommend signing up for the patient portal called "MyChart".  Sign up information is provided on this After Visit Summary.  MyChart is used to connect with patients for Virtual Visits (Telemedicine).  Patients are able to view lab/test results, encounter notes, upcoming appointments, etc.  Non-urgent messages can be sent to your provider as well.   To learn more about what you can do with MyChart, go to [email or web address].    Your next appointment:   2 month(s)  The format for your next appointment:   In Person  Provider:   You may see Nelva Bush, MD or one of the following Advanced Practice Providers on your designated Care Team:   Murray Hodgkins, NP Christell Faith, PA-C Cadence Kathlen Mody, PA-C{    You have been referred to [facility] in [place]. They will contact you to schedule the appointment.     Other Instructions N/A  Important  Information About Sugar

## 2022-03-28 NOTE — Progress Notes (Signed)
Cardiology Office Note:    Date:  04/28/2022   ID:  [name], DOB 15-Oct-1955, MRN [id number]  PCP:  Valerie Roys, DO  [facility] Cardiologist:  Nelva Bush, MD  [facility] Electrophysiologist:  None   Referring MD: Valerie Roys, DO   Chief Complaint: Hospital follow-up  History of Present Illness:    James Arnold is a 66 y.o. male with a hx of coronary artery disease status post multiple PCI's most recently to LAD and left circumflex LAD in 08/2021, HFrEF, essential hypertension, hyperlipidemia, stroke, end-stage renal disease on peritoneal dialysis, COPD, myelofibrosis who presents for hospital follow-up.   He was hospitalized in 08/2021 with NSTEMI and LHC showed severe 3VCAD. He was transferred to [place] at his request, where he underwent PCI to the LAD and Lcx.   He was admitted to the hospital in early April for acute on chronic HFrEF. Echo showed severely reduced LVEF, even slightly worse than prior study before his PCI at [facility].  He was admitted 6/11-6/14  for shortness of breath and chest pressure. Hgb was 8.8, BNP 1667 and HS trop 308. He was treated with IV lasix. It was felt troponin bump was demand ischemia and no further ischemic work-up was pursued. Statin was held for myalgias.   He was readmitted to [facility] 6/15-6/24 for chest pain and NSTEMI. He underwent LHC 6/15 which demonstrated 80-90%in-stent stenosis of his proximal LAD stent in addition to diffuse disease elsewhere (50% mid/distal LAD, 50% mid/distal Lcx, 80% RCA). The following day he underwent successful POBA to the LAD. His hospital course was complicated by decompensation requiring transfer to the CICU twice for hypotension thought secondary to metoprolol v adrenal insufficiency and later by Afib with RVR requiring cardioversion. He was sent home on Eliquis '5mg'$  BID. ASA and Coreg were discontinued.   Admitted again 7/4-7/7 for Afib with RVR and volume overload. He received IV lasix and a blood  transfusion for Hgb of 7.0. He was started on IV amiodarone and transitioned to oral amiodarone.   Today, the wife thinks patient needs O2 at home. The patient has episodes of shortness of breath. He has some chest pain associated with his breathing issues. BP is good today. He has not been taking midodrine every day. He is taking lasix '80mg'$  daily, he does make urine. They saw palliative care, said they would check on him in 4 weeks. Wife is distraught at current situation.   He had a stent placed at [facility] 09/18/22. Heart cath 03/07/22 with POBA to the LAD. They recommended DAPT for at least 12 months.     Past Medical History:  Diagnosis Date   Anemia    Benign hypertensive renal disease    CAD (coronary artery disease)    a. 08/1996 s/p BMS to the mLAD ([facility]); b. 12/2017 MV: EF 46%, --> low risk; c. 07/2020 MV: EF 51%, no ischemia/infarct; d. 08/2021 Cath/PCI: LM nl, LAD 60p, 95p/m ISR (3.0x48 Synergy DES), RI small, nl, LCX 40p, 95d(3.5x16 Synergy DES), OM1 50, RCA 40p/172m   Carotid arterial disease ([place]    a. 11/2020 Carotid U/S: <50% bilat ICA stenoses.   CHF (congestive heart failure) (HCC)    Chronic HFrEF (heart failure with reduced ejection fraction) ([place]    a. 08/2022 Echo: EF 35-40%.   CKD (chronic kidney disease), stage V ([place]    on peritoneal dialysis   Complication of anesthesia    please call by "[name]" when waking up!!   COPD (  chronic obstructive pulmonary disease) (HCC)    centrilobular emphysema. pt is poorly controlled with his copd/smoking.   Depression    Diastolic dysfunction    a. 12/2017 Echo: EF 60-65%, no rwma, Gr1 DD, mild AI/MR. Nl RVSP; b. 08/2020 Echo: EF 60-65%, no rwma, GrI DD, nl RV fxn. RVSP 32.14mHg.   Dyspnea on exertion    with exertion   Fatigue    FSGS (focal segmental glomerulosclerosis)    Hyperlipidemia 10/2002   Hypertension    Ischemic cardiomyopathy    a. 08/2022 Echo: EF 35-40%, glob HK, GrII DD, nl RV fxn, mildly dil LA, mild MR, mild  AS.   myelofibrosis    bone marrow failure   Myelofibrosis ([place] 2010   JAK2 (+) primary   [place]ocardial infarction ([place] 1997   stent x 1   Pneumoperitoneum 02/2021   PSVT (paroxysmal supraventricular tachycardia) ([place]    a. 12/2020 Zio: Sinus rhythm 80 (55-117). Rare PACs/PVCs. 17 episodes of SVT (longest 20.3 secs, fastest 184). No triggered events. No afib.   Smoker    Stroke ([facility]    a. 11/2020 MRI Brain: patchy acute/early subacute cortical infarcts w/in the R frontal, parietal, and occiptal lobes. Small, chronic L basal ganglia lacunar infarct.   Thrombocytopenia ([place]    Tobacco abuse 11/07/2020    Past Surgical History:  Procedure Laterality Date   ANGIOPLASTY     BONE MARROW ASPIRATION  03/2009   CARDIAC CATHETERIZATION  1997   stent x 1   COLONOSCOPY WITH PROPOFOL N/A 08/09/2018   Procedure: COLONOSCOPY WITH PROPOFOL;  Surgeon: AJonathon Bellows MD;  [facility];  Service: Gastroenterology;  Laterality: N/A;   CORONARY ARTERY BYPASS GRAFT     CORONARY STENT PLACEMENT  08/1996   ESOPHAGOGASTRODUODENOSCOPY (EGD) WITH PROPOFOL N/A 07/25/2021   Procedure: ESOPHAGOGASTRODUODENOSCOPY (EGD) WITH PROPOFOL;  Surgeon: AJonathon Bellows MD;  [facility];  Service: Gastroenterology;  Laterality: N/A;   EXTERIORIZATION OF A CONTINUOUS AMBULATORY PERITONEAL DIALYSIS CATHETER     EYE SURGERY Bilateral    cats removed   LEFT HEART CATH AND CORONARY ANGIOGRAPHY N/A 09/12/2021   Procedure: LEFT HEART CATH AND CORONARY ANGIOGRAPHY;  Surgeon: AWellington Hampshire MD;  [facility];  Service: Cardiovascular;  Laterality: N/A;   REVERSE SHOULDER ARTHROPLASTY Left 11/19/2020   Procedure: Left reverse shoulder arthroplasty;  Surgeon: PLeim Fabry MD;  [facility];  Service: Orthopedics;  Laterality: Left;    Current Medications: Current Meds  Medication Sig   acetaminophen (TYLENOL) 500 MG tablet Take 500 mg by mouth every 6 (six) hours as needed for  headache, fever, moderate pain or mild pain.   albuterol (PROVENTIL) (2.5 MG/3ML) 0.083% nebulizer solution Take 3 mLs (2.5 mg total) by nebulization every 6 (six) hours as needed for wheezing or shortness of breath.   albuterol (VENTOLIN HFA) 108 (90 Base) MCG/ACT inhaler Inhale 2 puffs into the lungs every 6 (six) hours as needed for wheezing or shortness of breath.   allopurinol (ZYLOPRIM) 100 MG tablet Take 100 mg by mouth daily.   amiodarone (PACERONE) 200 MG tablet Take 200 mg by mouth daily.   apixaban (ELIQUIS) 5 MG TABS tablet Take 1 tablet (5 mg total) by mouth 2 (two) times daily.   Cholecalciferol (VITAMIN D-1000 MAX ST) 25 MCG (1000 UT) tablet Take 1,000 Units by mouth daily.    clopidogrel (PLAVIX) 75 MG tablet Take 75 mg by mouth daily.   DULoxetine (CYMBALTA) 60 MG capsule Take 1 capsule (60  mg total) by mouth daily.   furosemide (LASIX) 80 MG tablet Take 1 tablet (80 mg total) by mouth daily. Increased from 20 mg daily.   gentamicin cream (GARAMYCIN) 0.1 % Apply 1 application topically every other day.   levETIRAcetam (KEPPRA) 500 MG tablet Take 1 tablet (500 mg total) by mouth at bedtime.   meclizine (ANTIVERT) 12.5 MG tablet Take 1 tablet (12.5 mg total) by mouth 2 (two) times daily as needed for dizziness.   nitroGLYCERIN (NITROSTAT) 0.4 MG SL tablet Place 1 tablet (0.4 mg total) under the tongue every 5 (five) minutes as needed for chest pain.   pantoprazole (PROTONIX) 40 MG tablet Take 40 mg by mouth daily.   predniSONE (DELTASONE) 10 MG tablet Take 1 tablet (10 mg total) by mouth daily with breakfast.   tamsulosin (FLOMAX) 0.4 MG CAPS capsule Take 2 capsules (0.8 mg total) by mouth daily.   [DISCONTINUED] nortriptyline (PAMELOR) 25 MG capsule Take 1 capsule (25 mg total) by mouth at bedtime.     Allergies:   Losartan and Statins   Social History   Socioeconomic History   Marital status: Married    Spouse name: James Arnold   Number of children: Not on file   Years of  education: Not on file   Highest education level: Not on file  Occupational History   Occupation: dt myelofibrosis    Comment: disabled since 2010  Tobacco Use   Smoking status: Former    Packs/day: 0.50    Years: 47.00    Total pack years: 23.50    Types: Cigarettes    Quit date: 09/11/2021    Years since quitting: 0.6   Smokeless tobacco: Never   Tobacco comments:    trying to cut back-smokes 6 to 7 cigs per day  Vaping Use   Vaping Use: Never used  Substance and Sexual Activity   Alcohol use: Yes    Comment: occasionally   Drug use: No   Sexual activity: Yes    Birth control/protection: None  Other Topics Concern   Not on file  Social History Narrative   Patient lives with wife in his home.   Not working dt disability.    Has been placed on kidney transplant evaluation list, but does not qualify d/t smoking and myelofibrosis   Social Determinants of Health   Financial Resource Strain: Low Risk  (01/14/2021)   Overall Financial Resource Strain (CARDIA)    Difficulty of Paying Living Expenses: Not hard at all  Food Insecurity: No Food Insecurity (01/14/2021)   Hunger Vital Sign    Worried About Running Out of Food in the Last Year: Never true    Ran Out of Food in the Last Year: Never true  Transportation Needs: No Transportation Needs (01/14/2021)   PRAPARE - Hydrologist (Medical): No    Lack of Transportation (Non-Medical): No  Physical Activity: Inactive (01/14/2021)   Exercise Vital Sign    Days of Exercise per Week: 0 days    Minutes of Exercise per Session: 0 min  Stress: No Stress Concern Present (01/14/2021)   [place]    Feeling of Stress : Not at all  Social Connections: Unknown (07/28/2017)   Social Connection and Isolation Panel [NHANES]    Frequency of Communication with Friends and Family: Patient refused    Frequency of Social Gatherings with Friends and  Family: Patient refused    Attends Religious Services: Patient refused  Active Member of Clubs or Organizations: Patient refused    Attends Archivist Meetings: Patient refused    Marital Status: Married     Family History: The patient's family history includes Breast cancer in an other family member; Depression in his father and sister; Heart disease in an other family member; Lung cancer in an other family member; Ovarian cancer in an other family member; Stomach cancer in an other family member; Stroke in his mother.  ROS:   Please see the history of present illness.     All other systems reviewed and are negative.  EKGs/Labs/Other Studies Reviewed:    The following studies were reviewed today:  Echo 02/2022 Summary 1. The left ventricle is moderately dilated in size with normal wall thickness. 2. The left ventricular systolic function is severely decreased, LVEF is visually estimated at 20-25%. 3. There is thinning and akinesis involving the mid anterior, mid antero- and inferoseptal, apical, apical anterior, apical inferior, apical septal segments. 4. Mitral annular calcification is present (moderate). 5. There is severe mitral valve regurgitation. 6. The aortic valve is poorly visualized, but appears thickened with reduced excursion. 7. There is at least moderate to severe low flow, low gradient aortic valve stenosis. 8. There is mild aortic regurgitation. 9. The left atrium is severely dilated in size. 10. The right ventricle is mildly dilated in size, with mildly reduced systolic function. 11. The right atrium is mildly dilated in size. Left Ventricle The left ventricle is moderately dilated in size with normal wall thickness. The left ventricular systolic function is severely decreased, LVEF is visually estimated at 20-25%. There is thinning and akinesis involving the mid anterior, mid antero- and inferoseptal, apical, apical anterior, apical inferior, apical septal segments.  Left ventricular diastolic function cannot be accurately assessed. No evidence of left ventricular thrombus. Right Ventricle The right ventricle is mildly dilated in size, with mildly reduced systolic function. Left Atrium The left atrium is severely dilated in size. Right Atrium The right atrium is mildly dilated in size. Aortic Valve The aortic valve is poorly visualized, but appears thickened with reduced excursion. There is mild aortic regurgitation. There is at least moderate to severe low flow, low gradient aortic valve stenosis. Peak AV transvalvular velocity: 2.8 m/s. Mean gradient: 18 mmHg. Doppler velocity index: 0.25. Estimated aortic valve area (VTI): 0.8 cm2. LVOT diameter: 1.9 cm. LV stroke volume index: 25.0 ml/m2. Pulmonic Valve Pulmonary valve is not well visualized. The pulmonic valve is normal. There is no significant pulmonic regurgitation. There is no evidence of a significant transvalvular gradient. Mitral Valve The mitral valve leaflets are mildly thickened with normal leaflet mobility. Mitral annular calcification is present (moderate). There is severe mitral valve regurgitation. There is systolic flow reversal in the pulmonary veins. Tricuspid Valve The tricuspid valve leaflets are poorly visualized but probably normal, with normal leaflet mobility. There is mild tricuspid regurgitation. There is mild pulmonary hypertension. TR maximum velocity: 2.9 m/s Estimated PASP: 48 mmHg. Pericardium/Pleural There is no pericardial effusion. Inferior Vena Cava IVC size and inspiratory change suggest elevated right atrial pressure. (10-20 mmHg). Aorta The aorta is normal in size in the visualized segments  Heart cath 6/19/23Cath/Vascular Procedure  Result Date: 03/06/2022 FINAL CARDIAC CATHETERIZATION REPORT CONCLUSIONS: - 80-90% in-stent stenosis of proximal LAD stent - Diffuse disease elsewhere (50% mid/distal LAD, 60-70% mid/distal Lcx, 80% RCA diffusely - nondominant) - Elevated LVEDP  RECOMMENDATIONS: - Heart Team discussion re: complex PCI. Obtain echocardiogram. - Other management per primary team - TR  band protocol PATIENT NAME: James Arnold, James Arnold INDICATION: 66 year old person with NSTEMI PROCEDURE DATE: 2022-03-06 ACCESS SITE: right radial artery PHYSICIANS: Caren Hazy (Attending), Ellsworth Lennox (Fellow - Diagnostic) REFERRING: Stormy Fabian, MD Diagnostic procedures: coronary angiography, left heart cath Contrast Used (ml): 40 DIAGNOSTIC FINDINGS: Coronary Angiography: - Dominance: LCA Coronary Findings: Left Main: - Ostial Left Main: No significant stenosis (large caliber) - Left Main: No significant stenosis (large caliber) Left Anterior Descending: - Ostial LAD: No significant stenosis (large caliber) - Proximal LAD: 80 to 90% stenosis (large caliber, in-stent) - Mid LAD: 50% stenosis (diffuse) - Distal LAD: 50% stenosis (diffuse) - First diagonal: No significant stenosis (small caliber) - Second diagonal: No significant stenosis (small caliber) - Third diagonal: No significant stenosis (small caliber) Left Circumflex: - Ostial LCx: No significant stenosis (large caliber) - Proximal LCx: 25% stenosis - Mid LCx: 60-70% stenosis - Distal LCx: 50% stenosis - OM1: No significant stenosis (moderate caliber) - OM2: No significant stenosis (moderate caliber) - OM3: 25% stenosis (large caliber, proximal) Right coronary artery: - Ostial RCA: No significant stenosis (large caliber) - Proximal RCA: 80% stenosis - Mid RCA: 80 to 90% stenosis - Distal RCA: 80% stenosis - PDA: - RCA continuation: Left Ventriculogram: - Left ventriculogram was not performed. Hemodynamics: BP / Ao: 104/70 Mean: 81 Left Heart: LVEDP: 25 Technique: The right radial artery was accessed with an angiocath and a 80F sheath was placed. '3mg'$  of verapamil was administered via the sheath. After accessing the ascending aorta, heparin was administered. At the end of the procedure, a TR band was placed to achieve hemostasis. LESION  ASSESSMENT / INTERVENTION: No interventions performed Catheters used: 80F JL 3.5 Complications: none reported Estimated blood loss: < 30 cc Radiation: Fluoro time (min): 4.1, Air Kerma Dose (mGy): 168.6, DAP (Gy-cm2):: 15.7 I have reviewed the recent history physical and documentation. I personally spent 22 minutes, continuously monitoring the patient face to face during the administration of moderate sedation. Independent observer RN was present for the duration of the procedure to assist in patient monitoring. Pre and post sedation activities have been reviewed. I (Dr. Caren Hazy) was present for the entire procedure.     Echo 12/2021  1. Left ventricular ejection fraction, by estimation, is 30 to 35%. Left  ventricular ejection fraction by 2D MOD biplane is 32.0 %. The left  ventricle has moderate to severely decreased function. The left ventricle  demonstrates global hypokinesis. The  left ventricular internal cavity size was moderately dilated. Left  ventricular diastolic parameters are consistent with Grade II diastolic  dysfunction (pseudonormalization). The average left ventricular global  longitudinal strain is -11.5 %. The global  longitudinal strain is abnormal.   2. Right ventricular systolic function is low normal. The right  ventricular size is mildly enlarged.   3. Left atrial size was severely dilated.   4. The mitral valve is normal in structure. Moderate mitral valve  regurgitation.   5. Mean aortic valve gradient 63mHg, Peak gradient 327mg, Vmax 2.79m2m  AVA 1.2cm2. . The aortic valve was not well visualized. Aortic valve  regurgitation is mild. Moderate aortic valve stenosis.   6. The inferior vena cava is normal in size with greater than 50%  respiratory variability, suggesting right atrial pressure of 3 mmHg.   Comparison(s): LVEF 35-40%, Mild MR, Mild AS.   Cardiac Cath 12/20222   1st Mrg lesion is 50% stenosed.   Dist Cx lesion is 95% stenosed.   Prox Cx to Mid  Cx lesion  is 40% stenosed.   Prox LAD to Mid LAD lesion is 95% stenosed.   Prox LAD lesion is 60% stenosed.   Prox RCA lesion is 40% stenosed.   Mid RCA lesion is 100% stenosed.   1.  Left dominant coronary arteries with severe three-vessel coronary artery disease.  The coronary arteries are heavily calcified especially the LAD. 2.  Left ventricular angiography was not performed to preserve contrast given residual renal function.  EF was moderately reduced by echo. 3.  Mildly elevated left ventricular end-diastolic pressure at 19 mmHg.   Recommendations: Given heavily calcified three-vessel coronary artery disease and cardiomyopathy, best option is CABG if the patient is deemed to be a candidate considering his comorbidities.  Otherwise, high risk PCI to the LAD and left circumflex can be considered with atherectomy to the LAD. Recommend resuming heparin drip in 6 hours (at 2200)   Echo 08/2021  1. Left ventricular ejection fraction, by estimation, is 35 to 40%. The  left ventricle has moderately decreased function. The left ventricle  demonstrates global hypokinesis. The left ventricular internal cavity size  was mildly dilated. Left ventricular  diastolic parameters are consistent with Grade II diastolic dysfunction  (pseudonormalization). The average left ventricular global longitudinal  strain is -9.8 %. The global longitudinal strain is abnormal.   2. Right ventricular systolic function is normal. The right ventricular  size is normal.   3. Left atrial size was mildly dilated.   4. The mitral valve is normal in structure. Mild mitral valve  regurgitation. No evidence of mitral stenosis.   5. The aortic valve is calcified. Aortic valve regurgitation is not  visualized. Mild aortic valve stenosis. Aortic valve area, by VTI measures  1.73 cm. Aortic valve mean gradient measures 14.0 mmHg.   6. The inferior vena cava is dilated in size with <50% respiratory  variability, suggesting  right atrial pressure of 15 mmHg.   EKG:  EKG is  ordered today.   Recent Labs: 07/10/2021: TSH 1.760 03/05/2022: Magnesium 1.8 03/25/2022: ALT 10; B Natriuretic Peptide 2,146.7 03/27/2022: BUN 74; Creatinine, Ser 7.61; Hemoglobin 7.0; Platelets 107; Potassium 3.4; Sodium 133  Recent Lipid Panel    Component Value Date/Time   CHOL 133 03/26/2022 0643   CHOL 160 07/10/2021 0828   TRIG 106 03/26/2022 0643   HDL 30 (L) 03/26/2022 0643   HDL 28 (L) 07/10/2021 0828   CHOLHDL 4.4 03/26/2022 0643   VLDL 21 03/26/2022 0643   LDLCALC 82 03/26/2022 0643   LDLCALC 110 (H) 07/10/2021 0828     Physical Exam:    VS:  BP (!) 110/58 (BP Location: Left Arm, Patient Position: Sitting, Cuff Size: Normal)   Pulse 90   Ht '5\' 7"'$  (1.702 m)   Wt 151 lb 3.2 oz (68.6 kg)   SpO2 90%   BMI 23.68 kg/m     Wt Readings from Last 3 Encounters:  03/28/22 151 lb 3.2 oz (68.6 kg)  03/28/22 144 lb 13.5 oz (65.7 kg)  03/21/22 147 lb 12.8 oz (67 kg)     GEN:  Well nourished, well developed in no acute distress HEENT: Normal NECK: No JVD; No carotid bruits LYMPHATICS: No lymphadenopathy CARDIAC: RRR, no murmurs, rubs, gallops RESPIRATORY:  +wheezing  ABDOMEN: Soft, non-tender, non-distended MUSCULOSKELETAL:  No edema; No deformity  SKIN: Warm and dry NEUROLOGIC:  Alert and oriented x 3 PSYCHIATRIC:  Normal affect   ASSESSMENT:    1. Aortic valve stenosis, etiology of cardiac valve disease unspecified  2. Mitral valve stenosis, unspecified etiology   3. Chronic combined systolic and diastolic congestive heart failure ([place])   4. Chronic systolic heart failure ([place])   5. Coronary artery disease involving native coronary artery of native heart without angina pectoris   6. Hypotension, unspecified hypotension type   7. Paroxysmal A-fib (HCC)    PLAN:    In order of problems listed above:  HFrEF Appears euvolemic on exam. Most recent echo 02/2022 at outside hospital showed LVEF 20-25%. Volume  management through dialysis and lasix '80mg'$  daily. H/o low BP limiting GDMT.  CAD No chest pain reported. He had a stent placed in December and more recently [facility] 6/15 demonstrated 80-90%in-stent stenosis of his proximal LAD stent in addition to diffuse disease elsewhere (50% mid/distal LAD, 50% mid/distal Lcx, 80% RCA). The following day he underwent successful POBA to the LAD. He is on plavix, ASA was stopped in the setting of Eliquis. Continue statin therapy. BB held for hypotension. No further ischemic work-up indicated.   ESRD He is on PD followed by nephrology. He is also on lasix '80mg'$  daily. I will defer to nephrology   [place] He was seen by palliative care in the hospital, plan to follow-up as outpatient. Home health was ordered by oncology.  Hypotension BP is good off midodrine. Recommend he stop midodrine and monitor Bps at home.   Afib He is in NSR today. Continue amiodarone '200mg'$  daily. Continue Eliquis '5mg'$  BID for stroke ppx.   Valvular disease Recent echo showed LVEF 20-25%, severe MR, moderate to severe low flow, low gradient aortic valve stenosis. May need further valve work-up, however may not be the best candidate given multiple comorbidities.   Disposition: Follow up in 2 month(s) with MD/APP     Signed, Angenette Daily Ninfa Meeker, PA-C  04/28/2022 10:37 AM    [place]

## 2022-03-28 NOTE — Telephone Encounter (Signed)
115 pm.  Return call made to wife Jackelyn Poling.  She has not heard from home health and was not sure what Palliative Care could offer.  Patient was back in the hospital this week.  I explained Palliative Care services vs Hospice and wife is asking for hospice. Wife advises she has spoken with different providers and goal is to manage symptoms and to keep patient comfortable.  She would like to avoid hospitalizations as there is little that can be done for patient.  We discussed dialysis and wife advised quality of life is awful either way.  Will request hospice referral at request of patient's wife.   Phone call made to PCP office to request hospice referral.  Spoke with Tiffany and information provided.

## 2022-03-28 NOTE — Telephone Encounter (Signed)
Copied from [place] [phone]. Topic: Referral - Request for Referral >> Mar 28, 2022  1:22 PM Tiffany B wrote: Requesting hospice referral please fax orders to [phone].Caller states patient wife is onboard.   Routing to provider and referral coordinator. Call from Almyra Free, [place] with [place].

## 2022-03-28 NOTE — Progress Notes (Signed)
PD treatment completed. Tolerated well. PD exit site, clean, dry and intact.    Total UF removed 527 ml .   Effluent appearance: clear , yellow, No fibrin noticed.  Patient asynptomatic

## 2022-03-28 NOTE — Consult Note (Signed)
   [facility] Inpatient Consult   03/28/2022  James Arnold Sep 17, 1956 [id number]  [place] [facility] [ACO] Patient: [employer]  *Remote review, patient at [facility]  Reviewed for hospital readmission less than 30 days with extreme high risk score for unplanned readmission.  Primary Care Provider:  Valerie Roys, DO, [facility],  is an embedded provider with a Chronic Care Management team and program, and is listed for the transition of care follow up and appointments.  Patient was screened for Embedded practice service needs for chronic care management or care coordination. Patient has transitioned home today.  Plan: No needs assessed as patient is followed by ACP  for palliative services and the provider is listed for TOC follow up for post hospital needs.  Please contact for further questions,  Natividad Brood, RN BSN [facility] Liaison  [phone] business mobile phone Toll free office [phone]  Fax number: [phone] [email or web address]

## 2022-03-29 DIAGNOSIS — N186 End stage renal disease: Secondary | ICD-10-CM | POA: Diagnosis not present

## 2022-03-29 DIAGNOSIS — Z992 Dependence on renal dialysis: Secondary | ICD-10-CM | POA: Diagnosis not present

## 2022-03-30 DIAGNOSIS — N186 End stage renal disease: Secondary | ICD-10-CM | POA: Diagnosis not present

## 2022-03-30 DIAGNOSIS — Z992 Dependence on renal dialysis: Secondary | ICD-10-CM | POA: Diagnosis not present

## 2022-03-31 ENCOUNTER — Ambulatory Visit: Payer: Self-pay | Admitting: *Deleted

## 2022-03-31 DIAGNOSIS — Z992 Dependence on renal dialysis: Secondary | ICD-10-CM | POA: Diagnosis not present

## 2022-03-31 DIAGNOSIS — N186 End stage renal disease: Secondary | ICD-10-CM | POA: Diagnosis not present

## 2022-03-31 NOTE — Telephone Encounter (Signed)
Returned patient wife call to make aware referral has been placed to hospice. Wife states they have already contacted them and have an appointment on Wednesday.

## 2022-03-31 NOTE — Telephone Encounter (Signed)
  Chief Complaint: wants Hospice Symptoms: SOB wants Oxygen at home Frequency: a lot Pertinent Negatives: Patient denies na Disposition: '[]'$ ED /'[]'$ Urgent Care (no appt availability in office) / '[]'$ Appointment(In office/virtual)/ '[]'$  [place] [facility]/ '[]'$ Home Care/ '[]'$ Refused Recommended Disposition /'[]'$  Mobile Bus/ '[x]'$  Follow-up with PCP Additional Notes: frequently hospitalized. Wife and patient requesting Hospice. Requesting last week and have not heard, getting upset. Heard from palliative but they do not have all the help that Hospice does per wife.   Answer Assessment - Initial Assessment Questions 1. REASON FOR CALL: "What is your main concern right now?"     Wants Hospice Services 2. ONSET: "When did the symptoms start?"     Exhaustion, can not walk, SOB, CP 3. SEVERITY: "How bad is the ambulation?"     Sometimes requires help with ambulation. Can not get walker because Medicare paid for cane. 4. FEVER: "Do you have a fever?"     no 5. OTHER SYMPTOMS: "Do you have any other new symptoms?"     no 6. TREATMENTS AND RESPONSE: "What have you done so far to try to make this better? What medicines have you used?"     Takes nebulizer at home. 7. CALLER's COPING: "How are you doing?" "How are other family members and loved ones doing?"     Wife is not healthy herself.  Protocols used: Hospice - No Guideline Available-A-AH

## 2022-04-01 DIAGNOSIS — Z992 Dependence on renal dialysis: Secondary | ICD-10-CM | POA: Diagnosis not present

## 2022-04-01 DIAGNOSIS — N186 End stage renal disease: Secondary | ICD-10-CM | POA: Diagnosis not present

## 2022-04-02 DIAGNOSIS — Z992 Dependence on renal dialysis: Secondary | ICD-10-CM | POA: Diagnosis not present

## 2022-04-02 DIAGNOSIS — N186 End stage renal disease: Secondary | ICD-10-CM | POA: Diagnosis not present

## 2022-04-03 ENCOUNTER — Other Ambulatory Visit: Payer: Self-pay | Admitting: Family Medicine

## 2022-04-03 DIAGNOSIS — N186 End stage renal disease: Secondary | ICD-10-CM | POA: Diagnosis not present

## 2022-04-03 DIAGNOSIS — Z992 Dependence on renal dialysis: Secondary | ICD-10-CM | POA: Diagnosis not present

## 2022-04-03 NOTE — Telephone Encounter (Signed)
Requested Prescriptions  Pending Prescriptions Disp Refills  . nortriptyline (PAMELOR) 25 MG capsule [Pharmacy Med Name: NORTRIPTYLINE HCL 25 MG Capsule] 90 capsule 1    Sig: TAKE 1 CAPSULE (25 MG TOTAL) BY MOUTH AT BEDTIME.     Psychiatry:  Antidepressants - Heterocyclics (TCAs) Passed - 04/03/2022 10:37 AM      Passed - Completed PHQ-2 or PHQ-9 in the last 360 days      Passed - Valid encounter within last 6 months    Recent Outpatient Visits          1 week ago NSTEMI (non-ST elevated myocardial infarction) [facility])   [place], Megan P, DO   2 months ago Hyperkalemia   [place], Megan P, DO   3 months ago Acute on chronic combined systolic and diastolic CHF (congestive heart failure) ([place])   [facility], Megan P, DO   4 months ago SOB (shortness of breath)   [place], Megan P, DO   5 months ago Cellulitis, unspecified cellulitis site   [facility] Jon Billings, NP      Future Appointments            In 4 weeks Wynetta Emery, Barb Merino, DO [place], [place]   In 2 months End, Harrell Gave, MD [facility], [place]

## 2022-04-04 ENCOUNTER — Telehealth: Payer: Self-pay

## 2022-04-04 DIAGNOSIS — N186 End stage renal disease: Secondary | ICD-10-CM | POA: Diagnosis not present

## 2022-04-04 DIAGNOSIS — Z992 Dependence on renal dialysis: Secondary | ICD-10-CM | POA: Diagnosis not present

## 2022-04-04 NOTE — Telephone Encounter (Signed)
Referral placed in Epic for Structural Heart evaluation by Cadence Furth per 03/28/22 office visit. It appears the pt had been referred to the Structural Heart team at [facility] to have a TEE performed to evaluate Mitral Insufficiency and the pt's wife was requesting to have work up performed closer to home.  Since the pt's 7/7 office visit the pt has now been enrolled in Hospice.  I spoke with the pt's wife and she confirmed that Hospice is now treating the pt at home.  I made her aware that we will not proceed with Structural Evaluation at this time due to evaluation and treatment requiring invasive procedure.  I advised her to reach out to cardiology with any additional needs in the future. She agreed with plan and was appreciative of call.

## 2022-04-05 DIAGNOSIS — N186 End stage renal disease: Secondary | ICD-10-CM | POA: Diagnosis not present

## 2022-04-05 DIAGNOSIS — Z992 Dependence on renal dialysis: Secondary | ICD-10-CM | POA: Diagnosis not present

## 2022-04-06 DIAGNOSIS — N186 End stage renal disease: Secondary | ICD-10-CM | POA: Diagnosis not present

## 2022-04-06 DIAGNOSIS — Z992 Dependence on renal dialysis: Secondary | ICD-10-CM | POA: Diagnosis not present

## 2022-04-07 DIAGNOSIS — Z992 Dependence on renal dialysis: Secondary | ICD-10-CM | POA: Diagnosis not present

## 2022-04-07 DIAGNOSIS — N186 End stage renal disease: Secondary | ICD-10-CM | POA: Diagnosis not present

## 2022-04-08 DIAGNOSIS — N186 End stage renal disease: Secondary | ICD-10-CM | POA: Diagnosis not present

## 2022-04-08 DIAGNOSIS — Z992 Dependence on renal dialysis: Secondary | ICD-10-CM | POA: Diagnosis not present

## 2022-04-09 DIAGNOSIS — Z992 Dependence on renal dialysis: Secondary | ICD-10-CM | POA: Diagnosis not present

## 2022-04-09 DIAGNOSIS — N186 End stage renal disease: Secondary | ICD-10-CM | POA: Diagnosis not present

## 2022-04-10 DIAGNOSIS — Z992 Dependence on renal dialysis: Secondary | ICD-10-CM | POA: Diagnosis not present

## 2022-04-10 DIAGNOSIS — N186 End stage renal disease: Secondary | ICD-10-CM | POA: Diagnosis not present

## 2022-04-11 DIAGNOSIS — Z992 Dependence on renal dialysis: Secondary | ICD-10-CM | POA: Diagnosis not present

## 2022-04-11 DIAGNOSIS — N186 End stage renal disease: Secondary | ICD-10-CM | POA: Diagnosis not present

## 2022-04-12 DIAGNOSIS — N186 End stage renal disease: Secondary | ICD-10-CM | POA: Diagnosis not present

## 2022-04-12 DIAGNOSIS — Z992 Dependence on renal dialysis: Secondary | ICD-10-CM | POA: Diagnosis not present

## 2022-04-13 DIAGNOSIS — Z992 Dependence on renal dialysis: Secondary | ICD-10-CM | POA: Diagnosis not present

## 2022-04-13 DIAGNOSIS — N186 End stage renal disease: Secondary | ICD-10-CM | POA: Diagnosis not present

## 2022-04-14 ENCOUNTER — Ambulatory Visit: Payer: Medicare HMO | Admitting: Family

## 2022-04-14 DIAGNOSIS — N186 End stage renal disease: Secondary | ICD-10-CM | POA: Diagnosis not present

## 2022-04-14 DIAGNOSIS — Z992 Dependence on renal dialysis: Secondary | ICD-10-CM | POA: Diagnosis not present

## 2022-04-14 NOTE — Addendum Note (Signed)
Addended by: Valerie Roys on: 04/14/2022 09:59 PM   Modules accepted: Level of Service

## 2022-04-15 DIAGNOSIS — Z992 Dependence on renal dialysis: Secondary | ICD-10-CM | POA: Diagnosis not present

## 2022-04-15 DIAGNOSIS — N186 End stage renal disease: Secondary | ICD-10-CM | POA: Diagnosis not present

## 2022-04-16 DIAGNOSIS — Z992 Dependence on renal dialysis: Secondary | ICD-10-CM | POA: Diagnosis not present

## 2022-04-16 DIAGNOSIS — N186 End stage renal disease: Secondary | ICD-10-CM | POA: Diagnosis not present

## 2022-04-17 DIAGNOSIS — N186 End stage renal disease: Secondary | ICD-10-CM | POA: Diagnosis not present

## 2022-04-17 DIAGNOSIS — Z992 Dependence on renal dialysis: Secondary | ICD-10-CM | POA: Diagnosis not present

## 2022-04-18 ENCOUNTER — Other Ambulatory Visit (INDEPENDENT_AMBULATORY_CARE_PROVIDER_SITE_OTHER): Payer: Medicare HMO | Admitting: Family Medicine

## 2022-04-18 ENCOUNTER — Other Ambulatory Visit (INDEPENDENT_AMBULATORY_CARE_PROVIDER_SITE_OTHER): Payer: Medicare Other | Admitting: Family Medicine

## 2022-04-18 DIAGNOSIS — W19XXXA Unspecified fall, initial encounter: Secondary | ICD-10-CM

## 2022-04-18 DIAGNOSIS — I214 Non-ST elevation (NSTEMI) myocardial infarction: Secondary | ICD-10-CM | POA: Diagnosis not present

## 2022-04-18 DIAGNOSIS — D631 Anemia in chronic kidney disease: Secondary | ICD-10-CM

## 2022-04-18 DIAGNOSIS — I5043 Acute on chronic combined systolic (congestive) and diastolic (congestive) heart failure: Secondary | ICD-10-CM

## 2022-04-18 DIAGNOSIS — N185 Chronic kidney disease, stage 5: Secondary | ICD-10-CM

## 2022-04-18 DIAGNOSIS — E785 Hyperlipidemia, unspecified: Secondary | ICD-10-CM

## 2022-04-18 DIAGNOSIS — I132 Hypertensive heart and chronic kidney disease with heart failure and with stage 5 chronic kidney disease, or end stage renal disease: Secondary | ICD-10-CM | POA: Diagnosis not present

## 2022-04-18 DIAGNOSIS — D638 Anemia in other chronic diseases classified elsewhere: Secondary | ICD-10-CM | POA: Diagnosis not present

## 2022-04-18 DIAGNOSIS — F331 Major depressive disorder, recurrent, moderate: Secondary | ICD-10-CM | POA: Diagnosis not present

## 2022-04-18 DIAGNOSIS — Z8673 Personal history of transient ischemic attack (TIA), and cerebral infarction without residual deficits: Secondary | ICD-10-CM

## 2022-04-18 DIAGNOSIS — N186 End stage renal disease: Secondary | ICD-10-CM

## 2022-04-18 DIAGNOSIS — I700 Atherosclerosis of aorta: Secondary | ICD-10-CM

## 2022-04-18 DIAGNOSIS — Z96612 Presence of left artificial shoulder joint: Secondary | ICD-10-CM

## 2022-04-18 DIAGNOSIS — Z992 Dependence on renal dialysis: Secondary | ICD-10-CM

## 2022-04-18 DIAGNOSIS — I2510 Atherosclerotic heart disease of native coronary artery without angina pectoris: Secondary | ICD-10-CM

## 2022-04-18 DIAGNOSIS — I5020 Unspecified systolic (congestive) heart failure: Secondary | ICD-10-CM

## 2022-04-18 DIAGNOSIS — I5022 Chronic systolic (congestive) heart failure: Secondary | ICD-10-CM

## 2022-04-18 DIAGNOSIS — I252 Old myocardial infarction: Secondary | ICD-10-CM

## 2022-04-18 DIAGNOSIS — D7581 Myelofibrosis: Secondary | ICD-10-CM

## 2022-04-18 DIAGNOSIS — D692 Other nonthrombocytopenic purpura: Secondary | ICD-10-CM

## 2022-04-18 DIAGNOSIS — F32A Depression, unspecified: Secondary | ICD-10-CM

## 2022-04-18 DIAGNOSIS — I4891 Unspecified atrial fibrillation: Secondary | ICD-10-CM

## 2022-04-18 DIAGNOSIS — I953 Hypotension of hemodialysis: Secondary | ICD-10-CM

## 2022-04-18 DIAGNOSIS — N2581 Secondary hyperparathyroidism of renal origin: Secondary | ICD-10-CM

## 2022-04-18 DIAGNOSIS — I5042 Chronic combined systolic (congestive) and diastolic (congestive) heart failure: Secondary | ICD-10-CM

## 2022-04-18 DIAGNOSIS — Z515 Encounter for palliative care: Secondary | ICD-10-CM

## 2022-04-18 DIAGNOSIS — N051 Unspecified nephritic syndrome with focal and segmental glomerular lesions: Secondary | ICD-10-CM

## 2022-04-18 DIAGNOSIS — I69369 Other paralytic syndrome following cerebral infarction affecting unspecified side: Secondary | ICD-10-CM

## 2022-04-18 NOTE — Progress Notes (Signed)
Received home health orders orders from [place]. Start of care 03/19/22.   Certification and orders from 03/19/22 through 05/17/22 are reviewed, signed and faxed back to home health company.  Need of intermittent skilled services at home: Physical therapy to help with pain control, energy conservation and manual therapy. Nursing monitoring eliquis and educate and monitor patient following NSTEMI. Get stronger and avoid falls  The home health care plan has been established by me and will be reviewed and updated as needed to maximize patient recovery.  I certify that all home health services have been and will be furnished to the patient while under my care.  Face-to-face encounter in which the need for home health services was established: At [facility] at discharge on 03/15/22  Patient is receiving home health services for the following diagnoses: Problem List Items Addressed This Visit       Cardiovascular and Mediastinum   Hypertensive heart and chronic kidney disease with heart failure and with stage 5 chronic kidney disease, or end stage renal disease ([place])   Chronic HFrEF (heart failure with reduced ejection fraction) (HCC)   HFrEF (heart failure with reduced ejection fraction) ([place])   NSTEMI (non-ST elevated myocardial infarction) ([place]) - Primary     Genitourinary   ESRD (end stage renal disease) ([place])   Anemia in ESRD (end-stage renal disease) ([place])     Other   Major depression, recurrent ([place])   Dialysis patient ([place])   Depression   Status post shoulder replacement   ESRD on peritoneal dialysis ([place])   Anemia of chronic disease   History of stroke   Fall     Jahn Franchini, DO

## 2022-04-18 NOTE — Progress Notes (Signed)
Received home health orders orders from [facility] . Start of care 04/03/22.   Certification and orders from 04/03/22 through 07/01/22 are reviewed, signed and faxed back to home health company.  Need of intermittent skilled services at home: Hospice ordered due to myelofibrosis and ESRD  The home health care plan has been established by me and will be reviewed and updated as needed to maximize patient comfort.  I certify that all home health services have been and will be furnished to the patient while under my care.  Face-to-face encounter in which the need for home health services was established: 03/28/22 at discharge  Patient is receiving home health services for the following diagnoses: Problem List Items Addressed This Visit       Cardiovascular and Mediastinum   Chronic combined systolic and diastolic congestive heart failure (HCC) - Primary (Chronic)   Hypotension (Chronic)   CAD (coronary artery disease)   Senile purpura ([place])   Hypertensive heart and chronic kidney disease with heart failure and with stage 5 chronic kidney disease, or end stage renal disease (HCC)   Aortic atherosclerosis (HCC)   Paralytic syndrome, non-dominant side, post-stroke (HCC)   Chronic HFrEF (heart failure with reduced ejection fraction) ([place])   Acute on chronic combined systolic and diastolic CHF (congestive heart failure) (HCC)   HFrEF (heart failure with reduced ejection fraction) ([place])   NSTEMI (non-ST elevated myocardial infarction) ([place])   Atrial fibrillation with RVR ([place])     Endocrine   Secondary hyperparathyroidism ([place])     Genitourinary   CKD (chronic kidney disease) stage 5, GFR less than 15 ml/min (HCC)   FSGS (focal segmental glomerulosclerosis)   ESRD (end stage renal disease) ([place])   Anemia in ESRD (end-stage renal disease) ([place])     Other   Myelofibrosis ([place])   Hyperlipidemia LDL goal <70   Major depression, recurrent ([place])   Anemia of chronic kidney failure, stage  5 ([place])   Dialysis patient ([place])   ESRD on peritoneal dialysis [facility])   Palliative care encounter     Park Liter, DO

## 2022-04-19 DIAGNOSIS — Z992 Dependence on renal dialysis: Secondary | ICD-10-CM | POA: Diagnosis not present

## 2022-04-19 DIAGNOSIS — N186 End stage renal disease: Secondary | ICD-10-CM | POA: Diagnosis not present

## 2022-04-20 DIAGNOSIS — Z992 Dependence on renal dialysis: Secondary | ICD-10-CM | POA: Diagnosis not present

## 2022-04-20 DIAGNOSIS — N186 End stage renal disease: Secondary | ICD-10-CM | POA: Diagnosis not present

## 2022-04-21 DIAGNOSIS — Z992 Dependence on renal dialysis: Secondary | ICD-10-CM | POA: Diagnosis not present

## 2022-04-21 DIAGNOSIS — N186 End stage renal disease: Secondary | ICD-10-CM | POA: Diagnosis not present

## 2022-04-22 DIAGNOSIS — Z992 Dependence on renal dialysis: Secondary | ICD-10-CM | POA: Diagnosis not present

## 2022-04-22 DIAGNOSIS — N186 End stage renal disease: Secondary | ICD-10-CM | POA: Diagnosis not present

## 2022-04-23 DIAGNOSIS — N186 End stage renal disease: Secondary | ICD-10-CM | POA: Diagnosis not present

## 2022-04-23 DIAGNOSIS — Z992 Dependence on renal dialysis: Secondary | ICD-10-CM | POA: Diagnosis not present

## 2022-04-24 DIAGNOSIS — Z992 Dependence on renal dialysis: Secondary | ICD-10-CM | POA: Diagnosis not present

## 2022-04-24 DIAGNOSIS — N186 End stage renal disease: Secondary | ICD-10-CM | POA: Diagnosis not present

## 2022-04-25 ENCOUNTER — Other Ambulatory Visit: Payer: Medicaid Other | Admitting: Primary Care

## 2022-04-25 ENCOUNTER — Other Ambulatory Visit: Payer: Self-pay

## 2022-04-25 DIAGNOSIS — Z992 Dependence on renal dialysis: Secondary | ICD-10-CM | POA: Diagnosis not present

## 2022-04-25 DIAGNOSIS — N186 End stage renal disease: Secondary | ICD-10-CM | POA: Diagnosis not present

## 2022-04-25 NOTE — Patient Outreach (Signed)
[facility]) Care Management  04/25/2022  James Arnold 1956/01/21 [id number]   Telephone Screen    Outreach call to patient to introduce [facility] services and assess care needs as part of benefit of PCP office and insurance plan. Spoke with spouse. She confirms hat patient is active with Hospice. She shares that they have bene out about three times already this week. Patient has had a rough week and sustained a fall. Support given to spouse. Discussed caregiver fatigue/burnout and ways to manage. Spouse aware that she can call hospice for any/concerns 24/7.     Plan: RN CM will close case.   Enzo Montgomery, RN,BSN,CCM [place] [facility] Telephonic Care Management Coordinator Direct Phone: [phone] Toll Free: [phone] Fax: [phone]

## 2022-04-26 DIAGNOSIS — Z992 Dependence on renal dialysis: Secondary | ICD-10-CM | POA: Diagnosis not present

## 2022-04-26 DIAGNOSIS — N186 End stage renal disease: Secondary | ICD-10-CM | POA: Diagnosis not present

## 2022-04-27 DIAGNOSIS — Z992 Dependence on renal dialysis: Secondary | ICD-10-CM | POA: Diagnosis not present

## 2022-04-27 DIAGNOSIS — N186 End stage renal disease: Secondary | ICD-10-CM | POA: Diagnosis not present

## 2022-04-28 DIAGNOSIS — N186 End stage renal disease: Secondary | ICD-10-CM | POA: Diagnosis not present

## 2022-04-28 DIAGNOSIS — Z992 Dependence on renal dialysis: Secondary | ICD-10-CM | POA: Diagnosis not present

## 2022-04-29 DIAGNOSIS — Z992 Dependence on renal dialysis: Secondary | ICD-10-CM | POA: Diagnosis not present

## 2022-04-29 DIAGNOSIS — N186 End stage renal disease: Secondary | ICD-10-CM | POA: Diagnosis not present

## 2022-04-30 DIAGNOSIS — N186 End stage renal disease: Secondary | ICD-10-CM | POA: Diagnosis not present

## 2022-04-30 DIAGNOSIS — Z992 Dependence on renal dialysis: Secondary | ICD-10-CM | POA: Diagnosis not present

## 2022-05-01 DIAGNOSIS — N186 End stage renal disease: Secondary | ICD-10-CM | POA: Diagnosis not present

## 2022-05-01 DIAGNOSIS — Z992 Dependence on renal dialysis: Secondary | ICD-10-CM | POA: Diagnosis not present

## 2022-05-02 ENCOUNTER — Ambulatory Visit: Payer: Medicare HMO | Admitting: Family Medicine

## 2022-05-02 DIAGNOSIS — N186 End stage renal disease: Secondary | ICD-10-CM | POA: Diagnosis not present

## 2022-05-02 DIAGNOSIS — Z992 Dependence on renal dialysis: Secondary | ICD-10-CM | POA: Diagnosis not present

## 2022-05-03 DIAGNOSIS — N186 End stage renal disease: Secondary | ICD-10-CM | POA: Diagnosis not present

## 2022-05-03 DIAGNOSIS — Z992 Dependence on renal dialysis: Secondary | ICD-10-CM | POA: Diagnosis not present

## 2022-05-04 DIAGNOSIS — N186 End stage renal disease: Secondary | ICD-10-CM | POA: Diagnosis not present

## 2022-05-04 DIAGNOSIS — Z992 Dependence on renal dialysis: Secondary | ICD-10-CM | POA: Diagnosis not present

## 2022-05-05 DIAGNOSIS — Z992 Dependence on renal dialysis: Secondary | ICD-10-CM | POA: Diagnosis not present

## 2022-05-05 DIAGNOSIS — N186 End stage renal disease: Secondary | ICD-10-CM | POA: Diagnosis not present

## 2022-05-06 DIAGNOSIS — N186 End stage renal disease: Secondary | ICD-10-CM | POA: Diagnosis not present

## 2022-05-06 DIAGNOSIS — Z992 Dependence on renal dialysis: Secondary | ICD-10-CM | POA: Diagnosis not present

## 2022-05-07 DIAGNOSIS — Z992 Dependence on renal dialysis: Secondary | ICD-10-CM | POA: Diagnosis not present

## 2022-05-07 DIAGNOSIS — N186 End stage renal disease: Secondary | ICD-10-CM | POA: Diagnosis not present

## 2022-05-08 DIAGNOSIS — Z992 Dependence on renal dialysis: Secondary | ICD-10-CM | POA: Diagnosis not present

## 2022-05-08 DIAGNOSIS — N186 End stage renal disease: Secondary | ICD-10-CM | POA: Diagnosis not present

## 2022-05-09 DIAGNOSIS — N186 End stage renal disease: Secondary | ICD-10-CM | POA: Diagnosis not present

## 2022-05-09 DIAGNOSIS — Z992 Dependence on renal dialysis: Secondary | ICD-10-CM | POA: Diagnosis not present

## 2022-05-10 DIAGNOSIS — N186 End stage renal disease: Secondary | ICD-10-CM | POA: Diagnosis not present

## 2022-05-10 DIAGNOSIS — Z992 Dependence on renal dialysis: Secondary | ICD-10-CM | POA: Diagnosis not present

## 2022-05-11 DIAGNOSIS — N186 End stage renal disease: Secondary | ICD-10-CM | POA: Diagnosis not present

## 2022-05-11 DIAGNOSIS — Z992 Dependence on renal dialysis: Secondary | ICD-10-CM | POA: Diagnosis not present

## 2022-05-12 DIAGNOSIS — Z992 Dependence on renal dialysis: Secondary | ICD-10-CM | POA: Diagnosis not present

## 2022-05-12 DIAGNOSIS — N186 End stage renal disease: Secondary | ICD-10-CM | POA: Diagnosis not present

## 2022-05-13 DIAGNOSIS — N186 End stage renal disease: Secondary | ICD-10-CM | POA: Diagnosis not present

## 2022-05-13 DIAGNOSIS — Z992 Dependence on renal dialysis: Secondary | ICD-10-CM | POA: Diagnosis not present

## 2022-05-14 DIAGNOSIS — Z992 Dependence on renal dialysis: Secondary | ICD-10-CM | POA: Diagnosis not present

## 2022-05-14 DIAGNOSIS — N186 End stage renal disease: Secondary | ICD-10-CM | POA: Diagnosis not present

## 2022-05-15 DIAGNOSIS — Z992 Dependence on renal dialysis: Secondary | ICD-10-CM | POA: Diagnosis not present

## 2022-05-15 DIAGNOSIS — N186 End stage renal disease: Secondary | ICD-10-CM | POA: Diagnosis not present

## 2022-05-16 DIAGNOSIS — Z992 Dependence on renal dialysis: Secondary | ICD-10-CM | POA: Diagnosis not present

## 2022-05-16 DIAGNOSIS — N186 End stage renal disease: Secondary | ICD-10-CM | POA: Diagnosis not present

## 2022-05-17 DIAGNOSIS — N186 End stage renal disease: Secondary | ICD-10-CM | POA: Diagnosis not present

## 2022-05-17 DIAGNOSIS — Z992 Dependence on renal dialysis: Secondary | ICD-10-CM | POA: Diagnosis not present

## 2022-05-18 DIAGNOSIS — Z992 Dependence on renal dialysis: Secondary | ICD-10-CM | POA: Diagnosis not present

## 2022-05-18 DIAGNOSIS — N186 End stage renal disease: Secondary | ICD-10-CM | POA: Diagnosis not present

## 2022-05-19 DIAGNOSIS — Z992 Dependence on renal dialysis: Secondary | ICD-10-CM | POA: Diagnosis not present

## 2022-05-19 DIAGNOSIS — N186 End stage renal disease: Secondary | ICD-10-CM | POA: Diagnosis not present

## 2022-05-20 DIAGNOSIS — N186 End stage renal disease: Secondary | ICD-10-CM | POA: Diagnosis not present

## 2022-05-20 DIAGNOSIS — Z992 Dependence on renal dialysis: Secondary | ICD-10-CM | POA: Diagnosis not present

## 2022-05-21 DIAGNOSIS — Z992 Dependence on renal dialysis: Secondary | ICD-10-CM | POA: Diagnosis not present

## 2022-05-21 DIAGNOSIS — N186 End stage renal disease: Secondary | ICD-10-CM | POA: Diagnosis not present

## 2022-05-22 DIAGNOSIS — Z992 Dependence on renal dialysis: Secondary | ICD-10-CM | POA: Diagnosis not present

## 2022-05-22 DIAGNOSIS — N186 End stage renal disease: Secondary | ICD-10-CM | POA: Diagnosis not present

## 2022-05-23 DIAGNOSIS — N186 End stage renal disease: Secondary | ICD-10-CM | POA: Diagnosis not present

## 2022-05-23 DIAGNOSIS — Z992 Dependence on renal dialysis: Secondary | ICD-10-CM | POA: Diagnosis not present

## 2022-05-24 DIAGNOSIS — N186 End stage renal disease: Secondary | ICD-10-CM | POA: Diagnosis not present

## 2022-05-24 DIAGNOSIS — Z992 Dependence on renal dialysis: Secondary | ICD-10-CM | POA: Diagnosis not present

## 2022-05-25 DIAGNOSIS — N186 End stage renal disease: Secondary | ICD-10-CM | POA: Diagnosis not present

## 2022-05-25 DIAGNOSIS — Z992 Dependence on renal dialysis: Secondary | ICD-10-CM | POA: Diagnosis not present

## 2022-05-26 DIAGNOSIS — Z992 Dependence on renal dialysis: Secondary | ICD-10-CM | POA: Diagnosis not present

## 2022-05-26 DIAGNOSIS — N186 End stage renal disease: Secondary | ICD-10-CM | POA: Diagnosis not present

## 2022-05-27 DIAGNOSIS — Z992 Dependence on renal dialysis: Secondary | ICD-10-CM | POA: Diagnosis not present

## 2022-05-27 DIAGNOSIS — N186 End stage renal disease: Secondary | ICD-10-CM | POA: Diagnosis not present

## 2022-05-28 DIAGNOSIS — Z992 Dependence on renal dialysis: Secondary | ICD-10-CM | POA: Diagnosis not present

## 2022-05-28 DIAGNOSIS — N186 End stage renal disease: Secondary | ICD-10-CM | POA: Diagnosis not present

## 2022-05-29 DIAGNOSIS — N186 End stage renal disease: Secondary | ICD-10-CM | POA: Diagnosis not present

## 2022-05-29 DIAGNOSIS — Z992 Dependence on renal dialysis: Secondary | ICD-10-CM | POA: Diagnosis not present

## 2022-05-30 DIAGNOSIS — N186 End stage renal disease: Secondary | ICD-10-CM | POA: Diagnosis not present

## 2022-05-30 DIAGNOSIS — Z992 Dependence on renal dialysis: Secondary | ICD-10-CM | POA: Diagnosis not present

## 2022-05-31 DIAGNOSIS — N186 End stage renal disease: Secondary | ICD-10-CM | POA: Diagnosis not present

## 2022-05-31 DIAGNOSIS — Z992 Dependence on renal dialysis: Secondary | ICD-10-CM | POA: Diagnosis not present

## 2022-06-01 DIAGNOSIS — N186 End stage renal disease: Secondary | ICD-10-CM | POA: Diagnosis not present

## 2022-06-01 DIAGNOSIS — Z992 Dependence on renal dialysis: Secondary | ICD-10-CM | POA: Diagnosis not present

## 2022-06-02 ENCOUNTER — Encounter: Payer: Self-pay | Admitting: Internal Medicine

## 2022-06-02 DIAGNOSIS — N186 End stage renal disease: Secondary | ICD-10-CM | POA: Diagnosis not present

## 2022-06-02 DIAGNOSIS — Z992 Dependence on renal dialysis: Secondary | ICD-10-CM | POA: Diagnosis not present

## 2022-06-03 DIAGNOSIS — Z992 Dependence on renal dialysis: Secondary | ICD-10-CM | POA: Diagnosis not present

## 2022-06-03 DIAGNOSIS — N186 End stage renal disease: Secondary | ICD-10-CM | POA: Diagnosis not present

## 2022-06-04 DIAGNOSIS — Z992 Dependence on renal dialysis: Secondary | ICD-10-CM | POA: Diagnosis not present

## 2022-06-04 DIAGNOSIS — N186 End stage renal disease: Secondary | ICD-10-CM | POA: Diagnosis not present

## 2022-06-05 ENCOUNTER — Ambulatory Visit: Payer: Medicare HMO | Admitting: Internal Medicine

## 2022-06-05 DIAGNOSIS — Z992 Dependence on renal dialysis: Secondary | ICD-10-CM | POA: Diagnosis not present

## 2022-06-05 DIAGNOSIS — N186 End stage renal disease: Secondary | ICD-10-CM | POA: Diagnosis not present

## 2022-06-06 DIAGNOSIS — N186 End stage renal disease: Secondary | ICD-10-CM | POA: Diagnosis not present

## 2022-06-06 DIAGNOSIS — Z992 Dependence on renal dialysis: Secondary | ICD-10-CM | POA: Diagnosis not present

## 2022-06-07 DIAGNOSIS — Z992 Dependence on renal dialysis: Secondary | ICD-10-CM | POA: Diagnosis not present

## 2022-06-07 DIAGNOSIS — N186 End stage renal disease: Secondary | ICD-10-CM | POA: Diagnosis not present

## 2022-06-08 DIAGNOSIS — N186 End stage renal disease: Secondary | ICD-10-CM | POA: Diagnosis not present

## 2022-06-08 DIAGNOSIS — Z992 Dependence on renal dialysis: Secondary | ICD-10-CM | POA: Diagnosis not present

## 2022-06-09 DIAGNOSIS — Z992 Dependence on renal dialysis: Secondary | ICD-10-CM | POA: Diagnosis not present

## 2022-06-09 DIAGNOSIS — N186 End stage renal disease: Secondary | ICD-10-CM | POA: Diagnosis not present

## 2022-06-10 DIAGNOSIS — N186 End stage renal disease: Secondary | ICD-10-CM | POA: Diagnosis not present

## 2022-06-10 DIAGNOSIS — Z992 Dependence on renal dialysis: Secondary | ICD-10-CM | POA: Diagnosis not present

## 2022-06-11 DIAGNOSIS — Z992 Dependence on renal dialysis: Secondary | ICD-10-CM | POA: Diagnosis not present

## 2022-06-11 DIAGNOSIS — N186 End stage renal disease: Secondary | ICD-10-CM | POA: Diagnosis not present

## 2022-06-12 DIAGNOSIS — N186 End stage renal disease: Secondary | ICD-10-CM | POA: Diagnosis not present

## 2022-06-12 DIAGNOSIS — Z992 Dependence on renal dialysis: Secondary | ICD-10-CM | POA: Diagnosis not present

## 2022-06-13 DIAGNOSIS — N186 End stage renal disease: Secondary | ICD-10-CM | POA: Diagnosis not present

## 2022-06-13 DIAGNOSIS — Z992 Dependence on renal dialysis: Secondary | ICD-10-CM | POA: Diagnosis not present

## 2022-06-14 DIAGNOSIS — Z992 Dependence on renal dialysis: Secondary | ICD-10-CM | POA: Diagnosis not present

## 2022-06-14 DIAGNOSIS — N186 End stage renal disease: Secondary | ICD-10-CM | POA: Diagnosis not present

## 2022-06-15 DIAGNOSIS — N186 End stage renal disease: Secondary | ICD-10-CM | POA: Diagnosis not present

## 2022-06-15 DIAGNOSIS — Z992 Dependence on renal dialysis: Secondary | ICD-10-CM | POA: Diagnosis not present

## 2022-06-16 DIAGNOSIS — Z992 Dependence on renal dialysis: Secondary | ICD-10-CM | POA: Diagnosis not present

## 2022-06-16 DIAGNOSIS — N186 End stage renal disease: Secondary | ICD-10-CM | POA: Diagnosis not present

## 2022-06-17 DIAGNOSIS — N186 End stage renal disease: Secondary | ICD-10-CM | POA: Diagnosis not present

## 2022-06-17 DIAGNOSIS — Z992 Dependence on renal dialysis: Secondary | ICD-10-CM | POA: Diagnosis not present

## 2022-06-18 DIAGNOSIS — N186 End stage renal disease: Secondary | ICD-10-CM | POA: Diagnosis not present

## 2022-06-18 DIAGNOSIS — Z992 Dependence on renal dialysis: Secondary | ICD-10-CM | POA: Diagnosis not present

## 2022-06-19 DIAGNOSIS — Z992 Dependence on renal dialysis: Secondary | ICD-10-CM | POA: Diagnosis not present

## 2022-06-19 DIAGNOSIS — N186 End stage renal disease: Secondary | ICD-10-CM | POA: Diagnosis not present

## 2022-06-20 DIAGNOSIS — Z992 Dependence on renal dialysis: Secondary | ICD-10-CM | POA: Diagnosis not present

## 2022-06-20 DIAGNOSIS — N186 End stage renal disease: Secondary | ICD-10-CM | POA: Diagnosis not present

## 2022-06-21 DIAGNOSIS — N186 End stage renal disease: Secondary | ICD-10-CM | POA: Diagnosis not present

## 2022-06-21 DIAGNOSIS — Z992 Dependence on renal dialysis: Secondary | ICD-10-CM | POA: Diagnosis not present

## 2022-06-22 DIAGNOSIS — Z992 Dependence on renal dialysis: Secondary | ICD-10-CM | POA: Diagnosis not present

## 2022-06-22 DIAGNOSIS — N186 End stage renal disease: Secondary | ICD-10-CM | POA: Diagnosis not present

## 2022-06-23 DIAGNOSIS — Z992 Dependence on renal dialysis: Secondary | ICD-10-CM | POA: Diagnosis not present

## 2022-06-23 DIAGNOSIS — N186 End stage renal disease: Secondary | ICD-10-CM | POA: Diagnosis not present

## 2022-06-24 DIAGNOSIS — Z992 Dependence on renal dialysis: Secondary | ICD-10-CM | POA: Diagnosis not present

## 2022-06-24 DIAGNOSIS — N186 End stage renal disease: Secondary | ICD-10-CM | POA: Diagnosis not present

## 2022-06-25 DIAGNOSIS — Z992 Dependence on renal dialysis: Secondary | ICD-10-CM | POA: Diagnosis not present

## 2022-06-25 DIAGNOSIS — N186 End stage renal disease: Secondary | ICD-10-CM | POA: Diagnosis not present

## 2022-06-26 DIAGNOSIS — Z992 Dependence on renal dialysis: Secondary | ICD-10-CM | POA: Diagnosis not present

## 2022-06-26 DIAGNOSIS — N186 End stage renal disease: Secondary | ICD-10-CM | POA: Diagnosis not present

## 2022-06-27 DIAGNOSIS — Z992 Dependence on renal dialysis: Secondary | ICD-10-CM | POA: Diagnosis not present

## 2022-06-27 DIAGNOSIS — N186 End stage renal disease: Secondary | ICD-10-CM | POA: Diagnosis not present

## 2022-06-28 DIAGNOSIS — Z992 Dependence on renal dialysis: Secondary | ICD-10-CM | POA: Diagnosis not present

## 2022-06-28 DIAGNOSIS — N186 End stage renal disease: Secondary | ICD-10-CM | POA: Diagnosis not present

## 2022-06-29 DIAGNOSIS — Z992 Dependence on renal dialysis: Secondary | ICD-10-CM | POA: Diagnosis not present

## 2022-06-29 DIAGNOSIS — N186 End stage renal disease: Secondary | ICD-10-CM | POA: Diagnosis not present

## 2022-06-30 DIAGNOSIS — Z992 Dependence on renal dialysis: Secondary | ICD-10-CM | POA: Diagnosis not present

## 2022-06-30 DIAGNOSIS — N186 End stage renal disease: Secondary | ICD-10-CM | POA: Diagnosis not present

## 2022-07-01 DIAGNOSIS — Z992 Dependence on renal dialysis: Secondary | ICD-10-CM | POA: Diagnosis not present

## 2022-07-01 DIAGNOSIS — N186 End stage renal disease: Secondary | ICD-10-CM | POA: Diagnosis not present

## 2022-07-02 DIAGNOSIS — Z992 Dependence on renal dialysis: Secondary | ICD-10-CM | POA: Diagnosis not present

## 2022-07-02 DIAGNOSIS — N186 End stage renal disease: Secondary | ICD-10-CM | POA: Diagnosis not present

## 2022-07-03 DIAGNOSIS — N186 End stage renal disease: Secondary | ICD-10-CM | POA: Diagnosis not present

## 2022-07-03 DIAGNOSIS — Z992 Dependence on renal dialysis: Secondary | ICD-10-CM | POA: Diagnosis not present

## 2022-07-04 DIAGNOSIS — Z992 Dependence on renal dialysis: Secondary | ICD-10-CM | POA: Diagnosis not present

## 2022-07-04 DIAGNOSIS — N186 End stage renal disease: Secondary | ICD-10-CM | POA: Diagnosis not present

## 2022-07-05 DIAGNOSIS — N186 End stage renal disease: Secondary | ICD-10-CM | POA: Diagnosis not present

## 2022-07-05 DIAGNOSIS — Z992 Dependence on renal dialysis: Secondary | ICD-10-CM | POA: Diagnosis not present

## 2022-07-07 ENCOUNTER — Encounter: Payer: Self-pay | Admitting: Family Medicine

## 2022-07-23 DEATH — deceased
